# Patient Record
Sex: Male | Born: 1960 | Race: Black or African American | Hispanic: No | Marital: Single | Smoking: Former smoker
Health system: Southern US, Community
[De-identification: ages and names within clinical notes are randomized; demographics above are authoritative.]

## PROBLEM LIST (undated history)

## (undated) DIAGNOSIS — R4701 Aphasia: Secondary | ICD-10-CM

## (undated) DIAGNOSIS — D500 Iron deficiency anemia secondary to blood loss (chronic): Secondary | ICD-10-CM

## (undated) DIAGNOSIS — R159 Full incontinence of feces: Secondary | ICD-10-CM

## (undated) DIAGNOSIS — E46 Unspecified protein-calorie malnutrition: Secondary | ICD-10-CM

## (undated) DIAGNOSIS — K219 Gastro-esophageal reflux disease without esophagitis: Secondary | ICD-10-CM

## (undated) DIAGNOSIS — E785 Hyperlipidemia, unspecified: Secondary | ICD-10-CM

## (undated) DIAGNOSIS — G931 Anoxic brain damage, not elsewhere classified: Secondary | ICD-10-CM

## (undated) DIAGNOSIS — N200 Calculus of kidney: Secondary | ICD-10-CM

## (undated) DIAGNOSIS — Z7401 Bed confinement status: Secondary | ICD-10-CM

## (undated) DIAGNOSIS — N390 Urinary tract infection, site not specified: Secondary | ICD-10-CM

## (undated) DIAGNOSIS — Z8489 Family history of other specified conditions: Secondary | ICD-10-CM

## (undated) DIAGNOSIS — I219 Acute myocardial infarction, unspecified: Secondary | ICD-10-CM

## (undated) DIAGNOSIS — I10 Essential (primary) hypertension: Secondary | ICD-10-CM

## (undated) DIAGNOSIS — N183 Chronic kidney disease, stage 3 unspecified: Secondary | ICD-10-CM

## (undated) DIAGNOSIS — Z993 Dependence on wheelchair: Secondary | ICD-10-CM

## (undated) DIAGNOSIS — Z87442 Personal history of urinary calculi: Secondary | ICD-10-CM

## (undated) DIAGNOSIS — R479 Unspecified speech disturbances: Secondary | ICD-10-CM

## (undated) DIAGNOSIS — R413 Other amnesia: Secondary | ICD-10-CM

## (undated) DIAGNOSIS — I213 ST elevation (STEMI) myocardial infarction of unspecified site: Secondary | ICD-10-CM

## (undated) DIAGNOSIS — M24549 Contracture, unspecified hand: Secondary | ICD-10-CM

## (undated) DIAGNOSIS — R2681 Unsteadiness on feet: Secondary | ICD-10-CM

## (undated) DIAGNOSIS — R351 Nocturia: Secondary | ICD-10-CM

## (undated) DIAGNOSIS — Z741 Need for assistance with personal care: Secondary | ICD-10-CM

## (undated) DIAGNOSIS — R319 Hematuria, unspecified: Secondary | ICD-10-CM

## (undated) DIAGNOSIS — K148 Other diseases of tongue: Secondary | ICD-10-CM

## (undated) DIAGNOSIS — R32 Unspecified urinary incontinence: Secondary | ICD-10-CM

## (undated) DIAGNOSIS — R1310 Dysphagia, unspecified: Secondary | ICD-10-CM

## (undated) DIAGNOSIS — B9620 Unspecified Escherichia coli [E. coli] as the cause of diseases classified elsewhere: Secondary | ICD-10-CM

## (undated) HISTORY — DX: Unspecified Escherichia coli (E. coli) as the cause of diseases classified elsewhere: B96.20

## (undated) HISTORY — DX: ST elevation (STEMI) myocardial infarction of unspecified site: I21.3

## (undated) HISTORY — DX: Unsteadiness on feet: R26.81

## (undated) HISTORY — DX: Essential (primary) hypertension: I10

## (undated) HISTORY — DX: Urinary tract infection, site not specified: N39.0

## (undated) HISTORY — DX: Anoxic brain damage, not elsewhere classified: G93.1

## (undated) HISTORY — DX: Hyperlipidemia, unspecified: E78.5

## (undated) HISTORY — DX: Dysphagia, unspecified: R13.10

---

## 2012-12-04 ENCOUNTER — Encounter (HOSPITAL_COMMUNITY): Payer: Self-pay | Admitting: Emergency Medicine

## 2012-12-04 ENCOUNTER — Emergency Department (HOSPITAL_COMMUNITY)
Admission: EM | Admit: 2012-12-04 | Discharge: 2012-12-04 | Discharge status: Against Medical Advice | Payer: Self-pay | Attending: Emergency Medicine | Admitting: Emergency Medicine

## 2012-12-04 DIAGNOSIS — F1010 Alcohol abuse, uncomplicated: Secondary | ICD-10-CM | POA: Insufficient documentation

## 2012-12-04 DIAGNOSIS — S199XXA Unspecified injury of neck, initial encounter: Secondary | ICD-10-CM | POA: Insufficient documentation

## 2012-12-04 DIAGNOSIS — S0993XA Unspecified injury of face, initial encounter: Secondary | ICD-10-CM | POA: Insufficient documentation

## 2012-12-04 DIAGNOSIS — T7411XA Adult physical abuse, confirmed, initial encounter: Secondary | ICD-10-CM | POA: Insufficient documentation

## 2012-12-04 HISTORY — DX: Essential (primary) hypertension: I10
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
## 2013-11-12 ENCOUNTER — Encounter (HOSPITAL_COMMUNITY): Payer: Self-pay | Admitting: Emergency Medicine

## 2013-11-12 ENCOUNTER — Emergency Department (HOSPITAL_COMMUNITY): Payer: No Typology Code available for payment source

## 2013-11-12 ENCOUNTER — Emergency Department (HOSPITAL_COMMUNITY)
Admission: EM | Admit: 2013-11-12 | Discharge: 2013-11-12 | Disposition: A | Discharge status: Home / Self Care | Payer: No Typology Code available for payment source | Attending: Emergency Medicine | Admitting: Emergency Medicine

## 2013-11-12 DIAGNOSIS — M542 Cervicalgia: Secondary | ICD-10-CM

## 2013-11-12 DIAGNOSIS — S0993XA Unspecified injury of face, initial encounter: Secondary | ICD-10-CM | POA: Insufficient documentation

## 2013-11-12 DIAGNOSIS — S199XXA Unspecified injury of neck, initial encounter: Secondary | ICD-10-CM

## 2013-11-12 DIAGNOSIS — Y92410 Unspecified street and highway as the place of occurrence of the external cause: Secondary | ICD-10-CM | POA: Insufficient documentation

## 2013-11-12 DIAGNOSIS — Z79899 Other long term (current) drug therapy: Secondary | ICD-10-CM | POA: Insufficient documentation

## 2013-11-12 DIAGNOSIS — Y9389 Activity, other specified: Secondary | ICD-10-CM | POA: Insufficient documentation

## 2013-11-12 DIAGNOSIS — M545 Low back pain, unspecified: Secondary | ICD-10-CM

## 2013-11-12 DIAGNOSIS — F17200 Nicotine dependence, unspecified, uncomplicated: Secondary | ICD-10-CM | POA: Insufficient documentation

## 2013-11-12 DIAGNOSIS — S0990XA Unspecified injury of head, initial encounter: Secondary | ICD-10-CM | POA: Insufficient documentation

## 2013-11-12 DIAGNOSIS — I10 Essential (primary) hypertension: Secondary | ICD-10-CM | POA: Insufficient documentation

## 2013-11-12 DIAGNOSIS — IMO0002 Reserved for concepts with insufficient information to code with codable children: Secondary | ICD-10-CM | POA: Insufficient documentation
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
## 2013-11-12 MED ORDER — HYDROCODONE-ACETAMINOPHEN 5-325 MG PO TABS
1.0000 | ORAL_TABLET | Freq: Once | ORAL | Status: AC
  Administered 2013-11-12: 1 via ORAL
  Filled 2013-11-12: qty 1

## 2013-11-12 MED ORDER — IBUPROFEN 800 MG PO TABS: 800.0000 mg | ORAL_TABLET | Freq: Three times a day (TID) | ORAL | Status: DC
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
## 2013-11-14 NOTE — ED Provider Notes (Signed)
Medical screening examination/treatment/procedure(s) were performed by non-physician practitioner and as supervising physician I was immediately available for consultation/collaboration.   EKG Interpretation None       Richarda Blade, MD 11/14/13 2115

## 2016-08-01 ENCOUNTER — Encounter (HOSPITAL_COMMUNITY): Payer: Self-pay | Admitting: Emergency Medicine

## 2016-08-01 ENCOUNTER — Emergency Department (HOSPITAL_COMMUNITY)
Admission: EM | Admit: 2016-08-01 | Discharge: 2016-08-01 | Disposition: A | Discharge status: Home / Self Care | Payer: Self-pay | Attending: Emergency Medicine | Admitting: Emergency Medicine

## 2016-08-01 DIAGNOSIS — F17210 Nicotine dependence, cigarettes, uncomplicated: Secondary | ICD-10-CM | POA: Insufficient documentation

## 2016-08-01 DIAGNOSIS — Z5321 Procedure and treatment not carried out due to patient leaving prior to being seen by health care provider: Secondary | ICD-10-CM | POA: Insufficient documentation

## 2016-08-01 DIAGNOSIS — R1032 Left lower quadrant pain: Secondary | ICD-10-CM | POA: Insufficient documentation

## 2016-08-01 DIAGNOSIS — R1110 Vomiting, unspecified: Secondary | ICD-10-CM | POA: Insufficient documentation

## 2016-08-01 DIAGNOSIS — Z7982 Long term (current) use of aspirin: Secondary | ICD-10-CM | POA: Insufficient documentation

## 2016-08-01 DIAGNOSIS — I10 Essential (primary) hypertension: Secondary | ICD-10-CM | POA: Insufficient documentation

## 2016-08-01 LAB — COMPREHENSIVE METABOLIC PANEL
ALT: 29 U/L (ref 17–63)
AST: 25 U/L (ref 15–41)
Albumin: 4.6 g/dL (ref 3.5–5.0)
Alkaline Phosphatase: 70 U/L (ref 38–126)
Anion gap: 9 (ref 5–15)
BUN: 15 mg/dL (ref 6–20)
CALCIUM: 9.9 mg/dL (ref 8.9–10.3)
CO2: 27 mmol/L (ref 22–32)
Chloride: 104 mmol/L (ref 101–111)
Creatinine, Ser: 1.62 mg/dL — ABNORMAL HIGH (ref 0.61–1.24)
GFR calc Af Amer: 54 mL/min — ABNORMAL LOW (ref >60)
GFR calc non Af Amer: 47 mL/min — ABNORMAL LOW (ref >60)
Glucose, Bld: 131 mg/dL — ABNORMAL HIGH (ref 65–99)
POTASSIUM: 4.4 mmol/L (ref 3.5–5.1)
Sodium: 140 mmol/L (ref 135–145)
TOTAL PROTEIN: 8.4 g/dL — AB (ref 6.5–8.1)
Total Bilirubin: 1 mg/dL (ref 0.3–1.2)

## 2016-08-01 LAB — CBC
HEMATOCRIT: 44.3 % (ref 39.0–52.0)
Hemoglobin: 15.8 g/dL (ref 13.0–17.0)
MCH: 33.3 pg (ref 26.0–34.0)
MCHC: 35.7 g/dL (ref 30.0–36.0)
MCV: 93.3 fL (ref 78.0–100.0)
Platelets: 156 10*3/uL (ref 150–400)
RBC: 4.75 MIL/uL (ref 4.22–5.81)
RDW: 12.6 % (ref 11.5–15.5)
WBC: 9.6 10*3/uL (ref 4.0–10.5)

## 2016-08-01 LAB — LIPASE, BLOOD: Lipase: 79 U/L — ABNORMAL HIGH (ref 11–51)

## 2016-08-01 NOTE — ED Triage Notes (Signed)
Called to take to room  No answer from lobby 

## 2016-08-01 NOTE — ED Notes (Signed)
Pt called for v\s recheck with no response from lobby.

## 2016-08-01 NOTE — ED Triage Notes (Signed)
Pt c/o LLQ abdominal pain, emesis onset this morning. Pt unable to differentiate whether pain is in abdomen or flank. No diarrhea.

## 2016-08-01 NOTE — ED Notes (Signed)
Pt called  No response from lobby  

## 2016-08-02 ENCOUNTER — Emergency Department (HOSPITAL_COMMUNITY): Payer: Self-pay

## 2016-08-02 ENCOUNTER — Emergency Department (HOSPITAL_COMMUNITY)
Admission: EM | Admit: 2016-08-02 | Discharge: 2016-08-02 | Disposition: A | Discharge status: Home / Self Care | Payer: Self-pay | Attending: Emergency Medicine | Admitting: Emergency Medicine

## 2016-08-02 ENCOUNTER — Encounter (HOSPITAL_COMMUNITY): Payer: Self-pay | Admitting: Emergency Medicine

## 2016-08-02 DIAGNOSIS — N200 Calculus of kidney: Secondary | ICD-10-CM | POA: Insufficient documentation

## 2016-08-02 DIAGNOSIS — F17210 Nicotine dependence, cigarettes, uncomplicated: Secondary | ICD-10-CM | POA: Insufficient documentation

## 2016-08-02 DIAGNOSIS — I10 Essential (primary) hypertension: Secondary | ICD-10-CM | POA: Insufficient documentation

## 2016-08-02 DIAGNOSIS — R3915 Urgency of urination: Secondary | ICD-10-CM

## 2016-08-02 DIAGNOSIS — N179 Acute kidney failure, unspecified: Secondary | ICD-10-CM | POA: Insufficient documentation

## 2016-08-02 DIAGNOSIS — E876 Hypokalemia: Secondary | ICD-10-CM | POA: Insufficient documentation

## 2016-08-02 DIAGNOSIS — R109 Unspecified abdominal pain: Secondary | ICD-10-CM

## 2016-08-02 DIAGNOSIS — Z7982 Long term (current) use of aspirin: Secondary | ICD-10-CM | POA: Insufficient documentation

## 2016-08-02 DIAGNOSIS — R112 Nausea with vomiting, unspecified: Secondary | ICD-10-CM

## 2016-08-02 HISTORY — DX: Calculus of kidney: N20.0

## 2016-08-02 LAB — URINALYSIS, ROUTINE W REFLEX MICROSCOPIC
Glucose, UA: NEGATIVE mg/dL
KETONES UR: NEGATIVE mg/dL
LEUKOCYTES UA: NEGATIVE
NITRITE: NEGATIVE
PH: 6 (ref 5.0–8.0)
PROTEIN: 30 mg/dL — AB
Specific Gravity, Urine: >1.03 — ABNORMAL HIGH (ref 1.005–1.030)

## 2016-08-02 LAB — CBC WITH DIFFERENTIAL/PLATELET
BASOS ABS: 0 10*3/uL (ref 0.0–0.1)
BASOS PCT: 0 %
Eosinophils Absolute: 0 10*3/uL (ref 0.0–0.7)
Eosinophils Relative: 0 %
HCT: 43.3 % (ref 39.0–52.0)
HEMOGLOBIN: 15.2 g/dL (ref 13.0–17.0)
Lymphocytes Relative: 13 %
Lymphs Abs: 1.5 10*3/uL (ref 0.7–4.0)
MCH: 33.3 pg (ref 26.0–34.0)
MCHC: 35.1 g/dL (ref 30.0–36.0)
MCV: 94.7 fL (ref 78.0–100.0)
Monocytes Absolute: 1 10*3/uL (ref 0.1–1.0)
Monocytes Relative: 9 %
NEUTROS PCT: 78 %
Neutro Abs: 8.8 10*3/uL — ABNORMAL HIGH (ref 1.7–7.7)
Platelets: 169 10*3/uL (ref 150–400)
RBC: 4.57 MIL/uL (ref 4.22–5.81)
RDW: 12.9 % (ref 11.5–15.5)
WBC: 11.2 10*3/uL — ABNORMAL HIGH (ref 4.0–10.5)

## 2016-08-02 LAB — COMPREHENSIVE METABOLIC PANEL
ALBUMIN: 4.4 g/dL (ref 3.5–5.0)
ALK PHOS: 61 U/L (ref 38–126)
ALT: 23 U/L (ref 17–63)
AST: 24 U/L (ref 15–41)
Anion gap: 9 (ref 5–15)
BUN: 17 mg/dL (ref 6–20)
CO2: 25 mmol/L (ref 22–32)
CREATININE: 2.05 mg/dL — AB (ref 0.61–1.24)
Calcium: 9.2 mg/dL (ref 8.9–10.3)
Chloride: 103 mmol/L (ref 101–111)
GFR calc Af Amer: 41 mL/min — ABNORMAL LOW (ref >60)
GFR, EST NON AFRICAN AMERICAN: 35 mL/min — AB (ref >60)
GLUCOSE: 123 mg/dL — AB (ref 65–99)
Potassium: 3.4 mmol/L — ABNORMAL LOW (ref 3.5–5.1)
Sodium: 137 mmol/L (ref 135–145)
Total Bilirubin: 1.1 mg/dL (ref 0.3–1.2)
Total Protein: 7.8 g/dL (ref 6.5–8.1)

## 2016-08-02 LAB — URINALYSIS, MICROSCOPIC (REFLEX)

## 2016-08-02 LAB — LIPASE, BLOOD: Lipase: 37 U/L (ref 11–51)

## 2016-08-02 IMAGING — CT CT RENAL STONE PROTOCOL
2 of 3 series · 16 of 46 positions shown, 18 images · non-contrast
Comparison: None.

CLINICAL DATA: Lower back pain for 1 day predominately on the left

EXAM:
CT ABDOMEN AND PELVIS WITHOUT CONTRAST
TECHNIQUE: Multidetector CT imaging of the abdomen and pelvis was performed
following the standard protocol without IV contrast.

[Series 3: coronal · coronal · 0.73mm/px · 3 of 167 slices shown]
[im 56/167  soft-tissue]
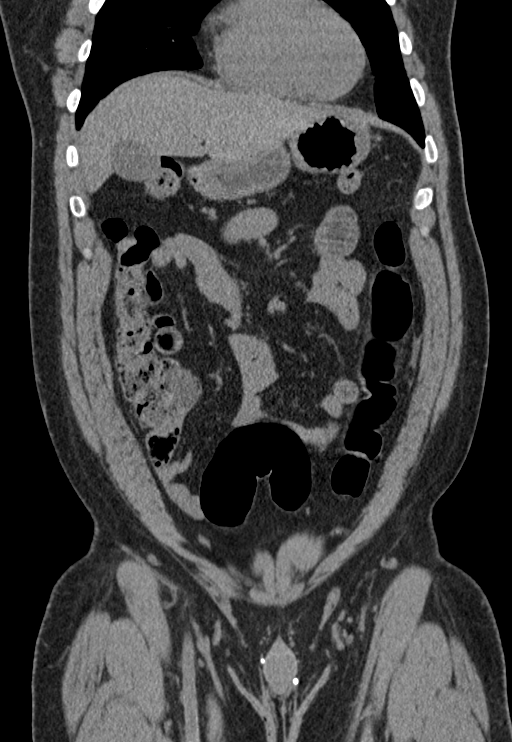
[im 74/167  soft-tissue]
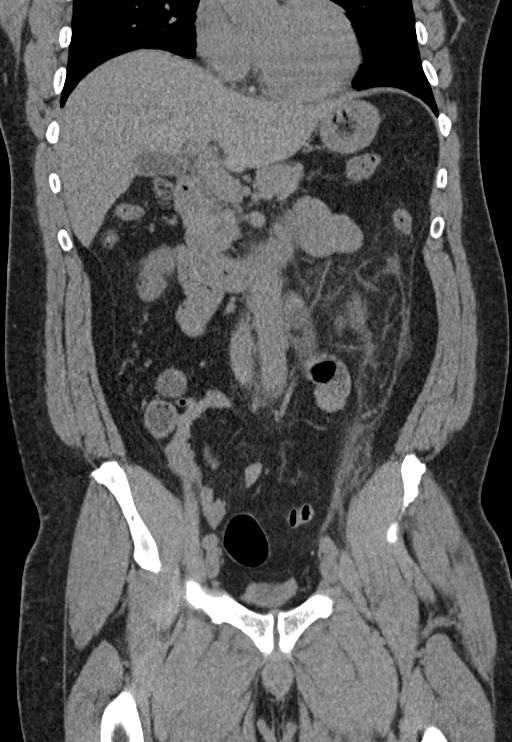
[im 93/167  soft-tissue]
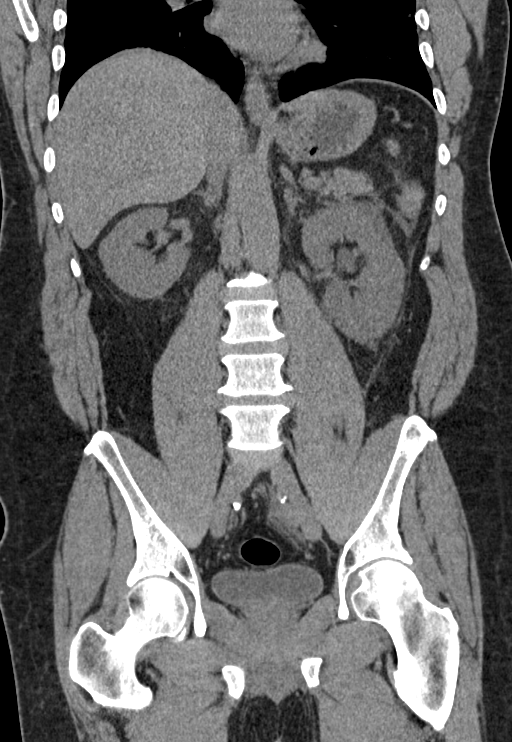

[Series 6: lung · axial · 0.98mm/px · z∈[-119,+5]mm · 13 of 72 slices shown, 15 images]
[im 5/72  soft-tissue]
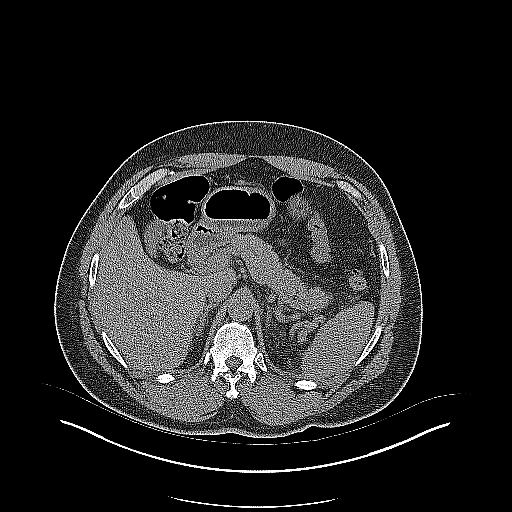
[im 5/72  bone]
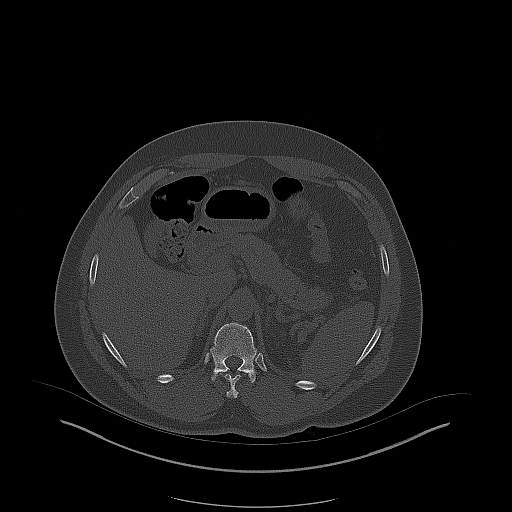
[im 10/72  soft-tissue]
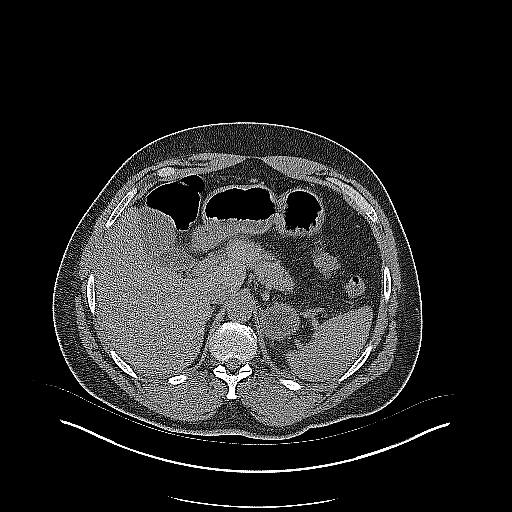
[im 14/72  soft-tissue]
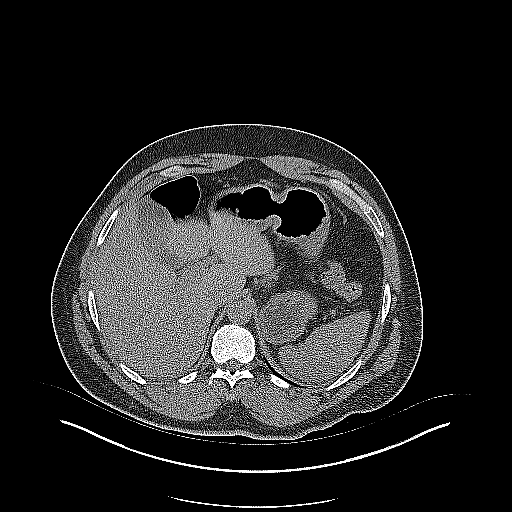
[im 21/72  soft-tissue]
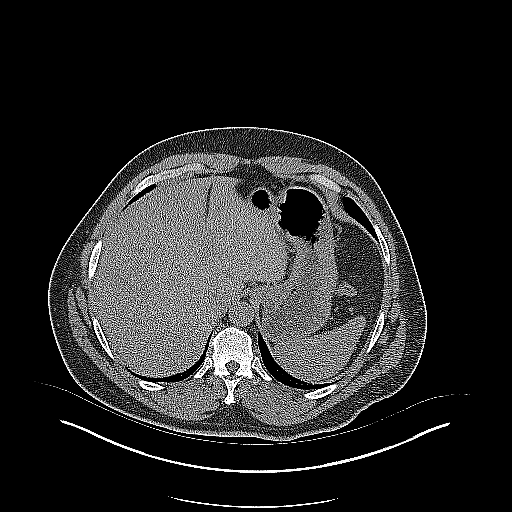
[im 26/72  soft-tissue]
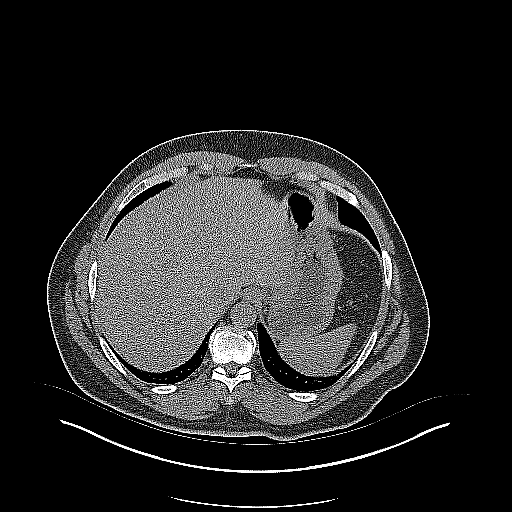
[im 30/72  soft-tissue]
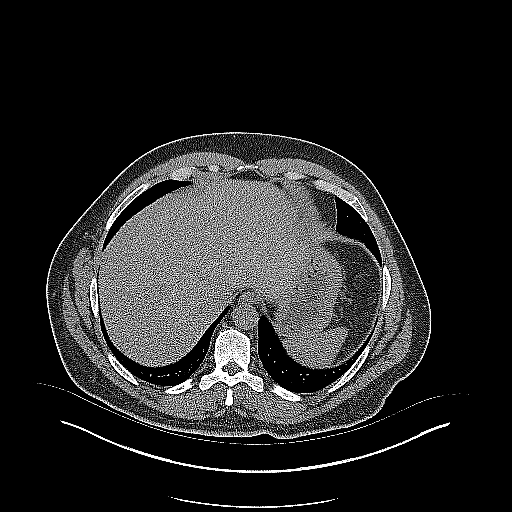
[im 37/72  soft-tissue]
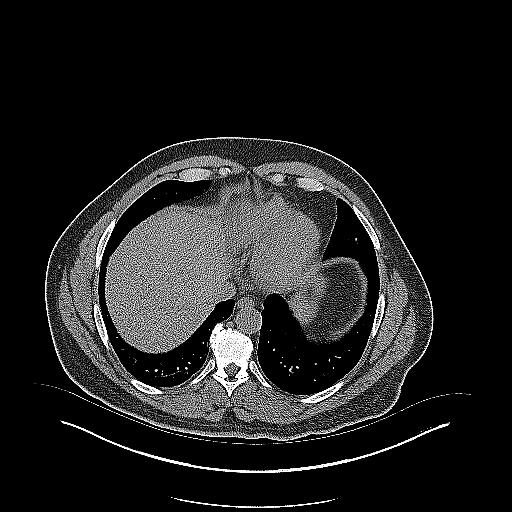
[im 42/72  soft-tissue]
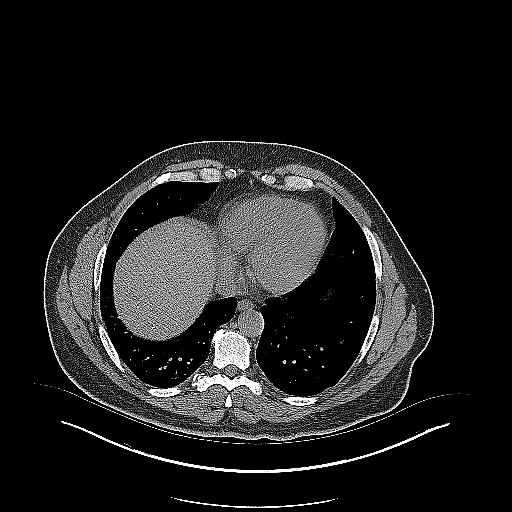
[im 46/72  soft-tissue]
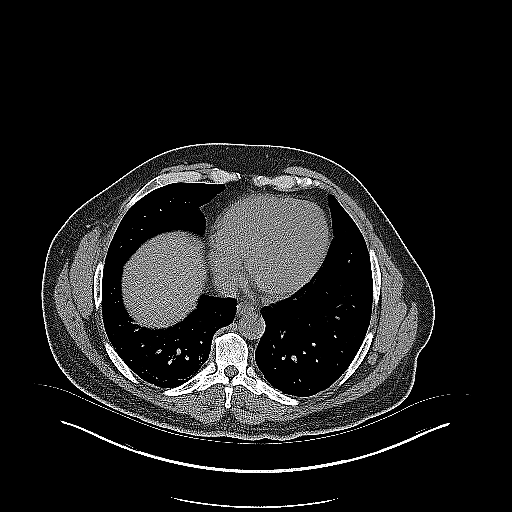
[im 46/72  bone]
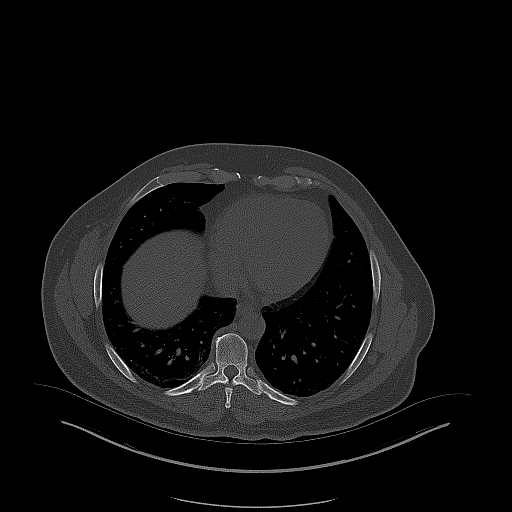
[im 51/72  soft-tissue]
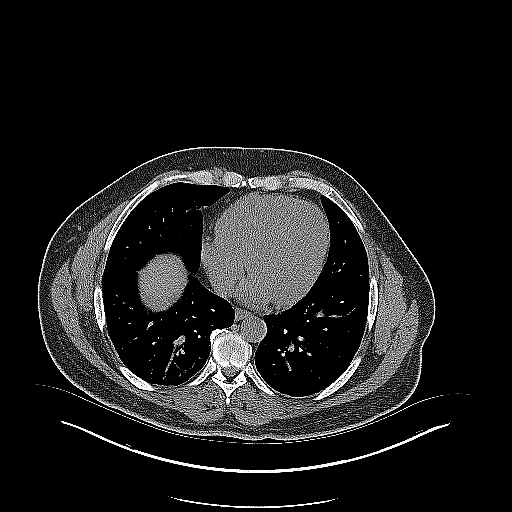
[im 58/72  soft-tissue]
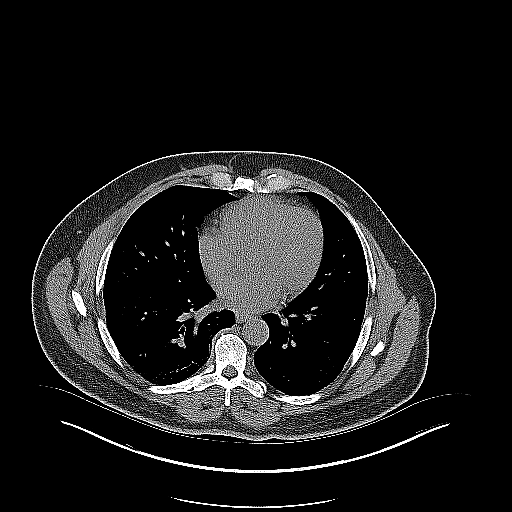
[im 62/72  soft-tissue]
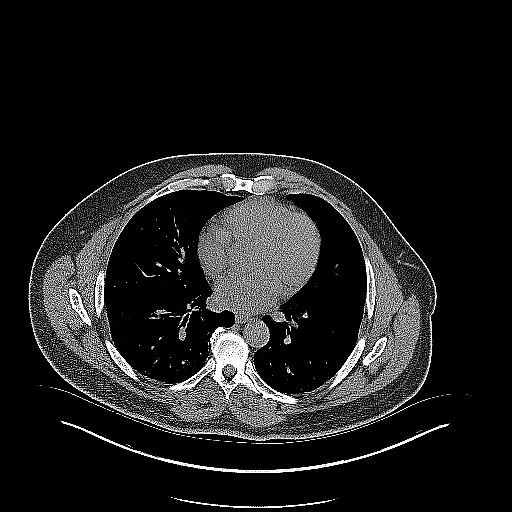
[im 67/72  soft-tissue]
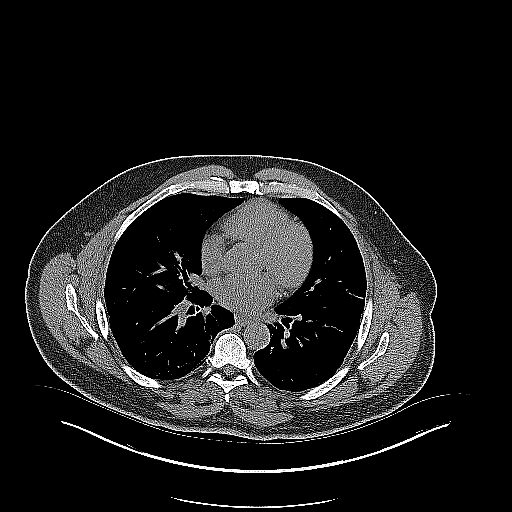

[16 of 46 positions shown; findings below may reference images not displayed]

FINDINGS: Lower chest: Mild dependent atelectatic changes are noted. No focal
infiltrate or effusion is seen.

Hepatobiliary: No focal liver abnormality is seen. No gallstones,
gallbladder wall thickening, or biliary dilatation.

Pancreas: Unremarkable. No pancreatic ductal dilatation or
surrounding inflammatory changes.

Spleen: Normal in size without focal abnormality.

Adrenals/Urinary Tract: The right adrenal gland and right kidney are
within normal limits. The left adrenal gland is also within normal
limits. The left kidney shows decreased attenuation and obstructive
changes with perinephric stranding. A small 2-3 mm upper pole stone
is seen. Additionally there is a 4 mm distal left ureteral stone at
the UVJ causing the obstructive change. The bladder is decompressed.

Stomach/Bowel: Scattered diverticular change of the colon is noted.
The appendix is within normal limits. No inflammatory changes are
seen.

Vascular/Lymphatic: Aortic atherosclerosis. No enlarged abdominal or
pelvic lymph nodes.

Reproductive: Prostate is unremarkable.

Other: No abdominal wall hernia or abnormality. No abdominopelvic
ascites.

Musculoskeletal: No acute or significant osseous findings.
IMPRESSION: Left distal ureteral stone with obstructive change. Nonobstructing
left renal calculus is noted.

## 2016-08-02 MED ORDER — POTASSIUM CHLORIDE CRYS ER 20 MEQ PO TBCR
20.0000 meq | EXTENDED_RELEASE_TABLET | Freq: Once | ORAL | Status: AC
  Administered 2016-08-02: 20 meq via ORAL
  Filled 2016-08-02: qty 1

## 2016-08-02 MED ORDER — HYDROCODONE-ACETAMINOPHEN 5-325 MG PO TABS: 1.0000 | ORAL_TABLET | Freq: Four times a day (QID) | ORAL | 0 refills | Status: DC | PRN

## 2016-08-02 MED ORDER — ONDANSETRON HCL 8 MG PO TABS: 8.0000 mg | ORAL_TABLET | Freq: Three times a day (TID) | ORAL | 0 refills | Status: DC | PRN

## 2016-08-02 MED ORDER — MORPHINE SULFATE (PF) 4 MG/ML IV SOLN
4.0000 mg | Freq: Once | INTRAVENOUS | Status: AC
  Administered 2016-08-02: 4 mg via INTRAVENOUS
  Filled 2016-08-02: qty 1

## 2016-08-02 MED ORDER — NAPROXEN 250 MG PO TABS: 250.0000 mg | ORAL_TABLET | Freq: Two times a day (BID) | ORAL | 0 refills | Status: DC | PRN

## 2016-08-02 MED ORDER — SODIUM CHLORIDE 0.9 % IV BOLUS (SEPSIS)
1000.0000 mL | Freq: Once | INTRAVENOUS | Status: AC
Start: 2016-08-02 — End: 2016-08-02
  Administered 2016-08-02: 1000 mL via INTRAVENOUS

## 2016-08-02 NOTE — ED Notes (Signed)
Patient given cup and instructed to give a urine sample when possible, unable to give at this time.

## 2016-08-02 NOTE — ED Provider Notes (Signed)
[facility] DEPT Provider Note   CSN: [id number] Arrival date & time: 08/02/16  1330     History   Chief Complaint Chief Complaint  Patient presents with  . Flank Pain    l/lower back pain    HPI LEONARD FAGNANI is a 55 y.o. male with a PMHx of HTN and nephrolithiasis, who presents to the ED with complaints of gradual onset left flank pain that began yesterday. Patient describes the pain as 7/10 intermittent pressure-like pain in the left flank radiating to the suprapubic area, with no known aggravating or alleviating factors given that he has not tried anything prior to arrival. Associated symptoms include increased urinary urgency, hesitancy, and darker urine. He reports that yesterday he had nausea and 2 episodes of nonbloody nonbilious emesis, but that has completely resolved and he has not had any emesis today. He states it feels somewhat similar to his prior kidney stone, although less severe than the last one. He hasn't had one in multiple years. He does not have a PCP or urologist. He has never needed any urologic procedure to pass a kidney stone.  He denies fevers, chills, CP, SOB, ongoing n/v, diarrhea, constipation, obstipation, melena, hematochezia, rectal pain, penile discharge, testicular pain/swelling, hematuria, dysuria, urinary frequency, malodorous urine, myalgias, arthralgias, numbness, tingling, weakness, or any other complaints at this time.    The history is provided by the patient and medical records. No language interpreter was used.  Flank Pain  This is a new problem. The current episode started yesterday. The problem occurs hourly (intermittently). The problem has not changed since onset.Associated symptoms include abdominal pain (radiating from flank). Pertinent negatives include no chest pain and no shortness of breath. Nothing aggravates the symptoms. Nothing relieves the symptoms. He has tried nothing for the symptoms. The treatment provided no relief.     Past Medical History:  Diagnosis Date  . Hypertension   . Kidney stones     There are no active problems to display for this patient.   History reviewed. No pertinent surgical history.     Home Medications    Prior to Admission medications   Medication Sig Start Date End Date Taking? Authorizing Provider  aspirin-acetaminophen-caffeine (EXCEDRIN MIGRAINE) [phone] MG per tablet Take 1 tablet by mouth every 6 (six) hours as needed (toothache).    Historical Provider, MD  ibuprofen (ADVIL,MOTRIN) 800 MG tablet Take 1 tablet (800 mg total) by mouth 3 (three) times daily. Take with meals 11/12/13   Harvie Heck, PA-C    Family History Family History  Problem Relation Age of Onset  . Cancer Mother   . Hypertension Father     Social History Social History  Substance Use Topics  . Smoking status: Current Every Day Smoker    Packs/day: 1.00    Types: Cigarettes  . Smokeless tobacco: Never Used  . Alcohol use Yes     Comment: occasional     Allergies   Patient has no known allergies.   Review of Systems Review of Systems  Constitutional: Negative for chills and fever.  Respiratory: Negative for shortness of breath.   Cardiovascular: Negative for chest pain.  Gastrointestinal: Positive for abdominal pain (radiating from flank), nausea (yesterday only, none ongoing) and vomiting (2x yesterday; none today). Negative for blood in stool, constipation, diarrhea and rectal pain.  Genitourinary: Positive for flank pain and urgency. Negative for discharge, dysuria, frequency, hematuria, scrotal swelling and testicular pain.       +urinary hesitancy +darker urine No malodorous  urine  Musculoskeletal: Negative for arthralgias and myalgias.  Skin: Negative for color change.  Allergic/Immunologic: Negative for immunocompromised state.  Neurological: Negative for weakness and numbness.  Psychiatric/Behavioral: Negative for confusion.   10 Systems reviewed and are  negative for acute change except as noted in the HPI.   Physical Exam Updated Vital Signs BP (!) 159/109   Pulse 73   Temp 97.8 F (36.6 C) (Oral)   Resp 18   Ht 6' (1.829 m)   Wt 102.1 kg   SpO2 95%   BMI 30.53 kg/m   Physical Exam  Constitutional: He is oriented to person, place, and time. Vital signs are normal. He appears well-developed and well-nourished.  Non-toxic appearance. No distress.  Afebrile, nontoxic, NAD  HENT:  Head: Normocephalic and atraumatic.  Mouth/Throat: Oropharynx is clear and moist and mucous membranes are normal.  Eyes: Conjunctivae and EOM are normal. Right eye exhibits no discharge. Left eye exhibits no discharge.  Neck: Normal range of motion. Neck supple.  Cardiovascular: Normal rate, regular rhythm, normal heart sounds and intact distal pulses.  Exam reveals no gallop and no friction rub.   No murmur heard. Pulmonary/Chest: Effort normal and breath sounds normal. No respiratory distress. He has no decreased breath sounds. He has no wheezes. He has no rhonchi. He has no rales.  Abdominal: Soft. Normal appearance and bowel sounds are normal. He exhibits no distension. There is tenderness in the suprapubic area. There is CVA tenderness. There is no rigidity, no rebound, no guarding, no tenderness at McBurney's point and negative Murphy's sign.  Soft, nondistended, +BS throughout, with mild suprapubic TTP tracking towards L flank, no r/g/r, neg murphy's, neg mcburney's, with +mild L sided CVA TTP   Musculoskeletal: Normal range of motion.  All spinal levels nonTTP without bony stepoffs or deformities  Gait steady Strength and sensation grossly intact in all extremities  Neurological: He is alert and oriented to person, place, and time. He has normal strength. No sensory deficit.  Skin: Skin is warm, dry and intact. No rash noted.  Psychiatric: He has a normal mood and affect.  Nursing note and vitals reviewed.    ED Treatments / Results  Labs (all  labs ordered are listed, but only abnormal results are displayed) Labs Reviewed  CBC WITH DIFFERENTIAL/PLATELET - Abnormal; Notable for the following:       Result Value   WBC 11.2 (*)    Neutro Abs 8.8 (*)    All other components within normal limits  COMPREHENSIVE METABOLIC PANEL - Abnormal; Notable for the following:    Potassium 3.4 (*)    Glucose, Bld 123 (*)    Creatinine, Ser 2.05 (*)    GFR calc non Af Amer 35 (*)    GFR calc Af Amer 41 (*)    All other components within normal limits  URINE CULTURE  LIPASE, BLOOD  URINALYSIS, ROUTINE W REFLEX MICROSCOPIC  GC/CHLAMYDIA PROBE AMP ([place]) NOT AT [facility]    EKG  EKG Interpretation None       Radiology Ct Renal Stone Study  Result Date: 08/02/2016 CLINICAL DATA:  Lower back pain for 1 day predominately on the left EXAM: CT ABDOMEN AND PELVIS WITHOUT CONTRAST TECHNIQUE: Multidetector CT imaging of the abdomen and pelvis was performed following the standard protocol without IV contrast. COMPARISON:  None. FINDINGS: Lower chest: Mild dependent atelectatic changes are noted. No focal infiltrate or effusion is seen. Hepatobiliary: No focal liver abnormality is seen. No gallstones, gallbladder wall thickening,  or biliary dilatation. Pancreas: Unremarkable. No pancreatic ductal dilatation or surrounding inflammatory changes. Spleen: Normal in size without focal abnormality. Adrenals/Urinary Tract: The right adrenal gland and right kidney are within normal limits. The left adrenal gland is also within normal limits. The left kidney shows decreased attenuation and obstructive changes with perinephric stranding. A small 2-3 mm upper pole stone is seen. Additionally there is a 4 mm distal left ureteral stone at the UVJ causing the obstructive change. The bladder is decompressed. Stomach/Bowel: Scattered diverticular change of the colon is noted. The appendix is within normal limits. No inflammatory changes are seen. Vascular/Lymphatic:  Aortic atherosclerosis. No enlarged abdominal or pelvic lymph nodes. Reproductive: Prostate is unremarkable. Other: No abdominal wall hernia or abnormality. No abdominopelvic ascites. Musculoskeletal: No acute or significant osseous findings. IMPRESSION: Left distal ureteral stone with obstructive change. Nonobstructing left renal calculus is noted. Electronically Signed   By: Inez Catalina M.D.   On: 08/02/2016 15:40    Procedures Procedures (including critical care time)  Bladder Scan Volume (mL): 27 mL   Medications Ordered in ED Medications  potassium chloride SA (K-DUR,KLOR-CON) CR tablet 20 mEq (not administered)  morphine 4 MG/ML injection 4 mg (4 mg Intravenous Given 08/02/16 1426)  sodium chloride 0.9 % bolus 1,000 mL (1,000 mLs Intravenous New Bag/Given 08/02/16 1426)     Initial Impression / Assessment and Plan / ED Course  I have reviewed the triage vital signs and the nursing notes.  Pertinent labs & imaging results that were available during my care of the patient were reviewed by me and considered in my medical decision making (see chart for details).  Clinical Course     55 y.o. male here with L flank pain radiating to suprapubic area, n/v yesterday but that resolved; increased urinary urgency and darker urine, but no hematuria/dysuria/malodorous urine. States he's had some hesitancy, but was able to produce urine sample here without difficulty. On exam, mild L flank TTP, mild suprapubic TTP, nonperitoneal, afebrile and nontoxic, gait steady. Will get U/A, UCx, bladder scan, CBC w/diff, CMP, lipase, and CT renal study. Will also obtain GC/CT probe on urine sample, in case this ends up being some type of prostatitis type issue, although doubtful as a cause given no rectal pain or penile complaints. Will give fluids and pain meds; pt declines wanting anything for nausea since this is resolved. Will reassess shortly  3:56 PM Pt feeling much better, pain resolved, tolerating  PO. Post-void Bladder scan 53mLs. CBC w/diff with mildly elevated WBC 11.2 but differential unremarkable. CMP showing Cr 2.05 which is up from yesterday's value 1.62; no prior values to compare to, but likely from dehydration/inadequate PO intake; remainder of CMP still pending. Lipase WNL. U/A in process. CT renal study showing 98mm distal left UVJ stone with some obstructive changes around L kidney including perinephric stranding and decreased attenuation; also with 2-64mm upper pole stone seen in L kidney. 39mm stone will likely pass without any difficulty. Will reassess after CMP and U/A result; likely d/c home with pain/nausea meds and urology f/up. Will reassess shortly.  4:12 PM Remainder of CMP showing mildly low K 3.4, will give Kdur here but doubt need for further repletion. U/A still pending, called lab to see what the delay is, they informed me that they're having to run it manually. Patient care to be resumed by Doreen Salvage, PA-C at shift change sign-out. Patient history has been discussed with midlevel resuming care. Please see their notes for further documentation of  pending results and dispo/care. Pt stable at sign-out and updated on transfer of care. IF U/A Unremarkable or not needing further intervention, then plan is to d/c home with pain meds, nausea meds, and f/up with urology as well as [place] in 1-2wks to establish care. Advised pt to stay hydrated with plenty of water, and strain all urine. Pt stable at time of transfer, please see Doreen Salvage' notes for documentation of U/A results and any further care.  Final Clinical Impressions(s) / ED Diagnoses   Final diagnoses:  Left flank pain  Urinary urgency  Nephrolithiasis  Non-intractable vomiting with nausea, unspecified vomiting type  AKI (acute kidney injury) ([place])  Hypokalemia    New Prescriptions New Prescriptions   HYDROCODONE-ACETAMINOPHEN (NORCO) 5-325 MG TABLET    Take 1 tablet by mouth every 6 (six) hours as needed for severe  pain.   NAPROXEN (NAPROSYN) 250 MG TABLET    Take 1 tablet (250 mg total) by mouth 2 (two) times daily as needed for mild pain, moderate pain or headache (TAKE WITH MEALS.).   ONDANSETRON (ZOFRAN) 8 MG TABLET    Take 1 tablet (8 mg total) by mouth every 8 (eight) hours as needed for nausea or vomiting.     [address] [place], PA-C 08/02/16 [place], MD 08/02/16 [phone]

## 2016-08-02 NOTE — Discharge Instructions (Signed)
Stay well hydrated with plenty of water! Take naprosyn as directed as needed for pain using norco for breakthrough pain. Do not drive or operate machinery with pain medication use. May need over-the-counter stool softener with this pain medication use. Use Zofran as needed for nausea. Strain all urine to try to catch the stone when it passes. Follow-up with the urologist in the next 1 to 2 weeks for recheck of ongoing pain and ongoing management of your kidney stone, as well as following up with [facility] in 1-2 weeks to establish medical care and to recheck your labs; however for intractable or uncontrollable pain or vomiting at home then return to the [facility] emergency department.

## 2016-08-02 NOTE — ED Triage Notes (Signed)
Pt stated that he has l/lower back pain x 24 hours. Pain decreased in severity, remains in l/flank area. Pt stated that he has pressure in bladder. Has not urinated since yesterday. Denies NVD. Has been drinking clear liquids

## 2016-08-03 LAB — URINE CULTURE: CULTURE: NO GROWTH

## 2016-08-05 LAB — GC/CHLAMYDIA PROBE AMP (~~LOC~~) NOT AT ARMC
CHLAMYDIA, DNA PROBE: NEGATIVE
Neisseria Gonorrhea: NEGATIVE

## 2019-03-08 ENCOUNTER — Other Ambulatory Visit: Payer: Self-pay

## 2019-03-08 ENCOUNTER — Emergency Department (HOSPITAL_COMMUNITY): Payer: Self-pay

## 2019-03-08 ENCOUNTER — Emergency Department (HOSPITAL_COMMUNITY)
Admission: EM | Admit: 2019-03-08 | Discharge: 2019-03-08 | Disposition: A | Discharge status: Home / Self Care | Payer: Self-pay | Attending: Emergency Medicine | Admitting: Emergency Medicine

## 2019-03-08 ENCOUNTER — Encounter (HOSPITAL_COMMUNITY): Payer: Self-pay

## 2019-03-08 DIAGNOSIS — Z79899 Other long term (current) drug therapy: Secondary | ICD-10-CM | POA: Insufficient documentation

## 2019-03-08 DIAGNOSIS — F17210 Nicotine dependence, cigarettes, uncomplicated: Secondary | ICD-10-CM | POA: Insufficient documentation

## 2019-03-08 DIAGNOSIS — M545 Low back pain, unspecified: Secondary | ICD-10-CM

## 2019-03-08 DIAGNOSIS — I10 Essential (primary) hypertension: Secondary | ICD-10-CM | POA: Insufficient documentation

## 2019-03-08 LAB — URINALYSIS, COMPLETE (UACMP) WITH MICROSCOPIC
Bacteria, UA: NONE SEEN
Bilirubin Urine: NEGATIVE
Glucose, UA: NEGATIVE mg/dL
Hgb urine dipstick: NEGATIVE
Ketones, ur: NEGATIVE mg/dL
Nitrite: NEGATIVE
Protein, ur: NEGATIVE mg/dL
Specific Gravity, Urine: 1.02 (ref 1.005–1.030)
pH: 5 (ref 5.0–8.0)

## 2019-03-08 IMAGING — CR LUMBAR SPINE - COMPLETE 4+ VIEW
5 series · 5 of 5 positions shown · non-contrast
Comparison: CT [DATE].

CLINICAL DATA: Low back pain.  No injury.

EXAM:
LUMBAR SPINE - COMPLETE 4+ VIEW

[t lumbar spine ap]
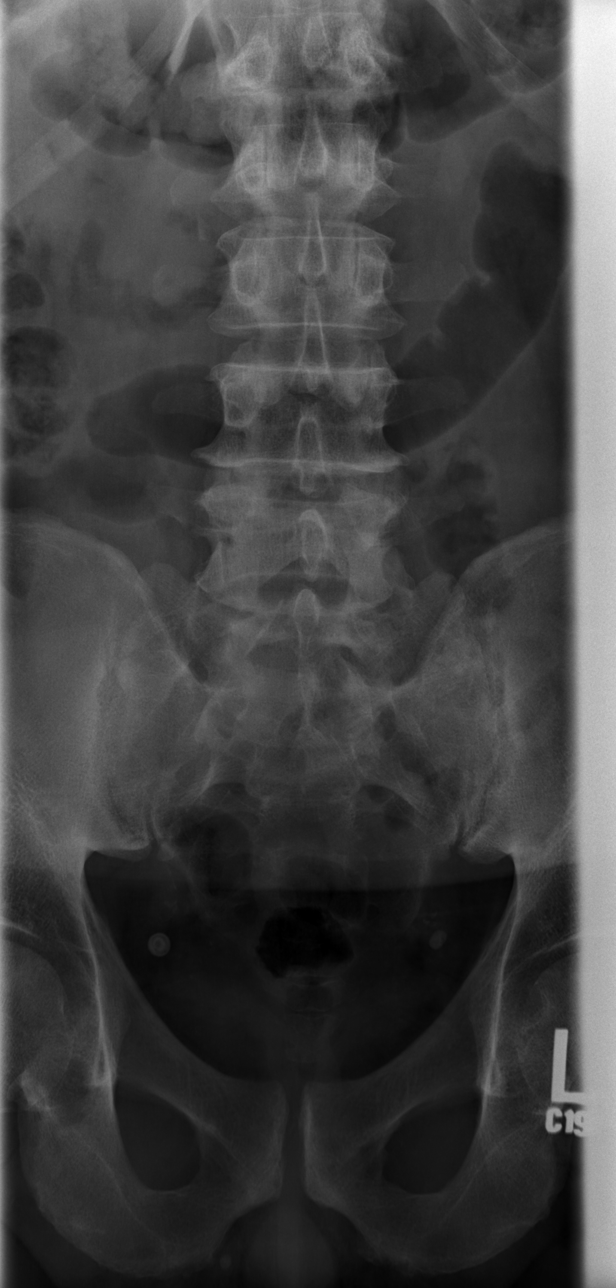

[t lumbar spine obl (1 of 2)]
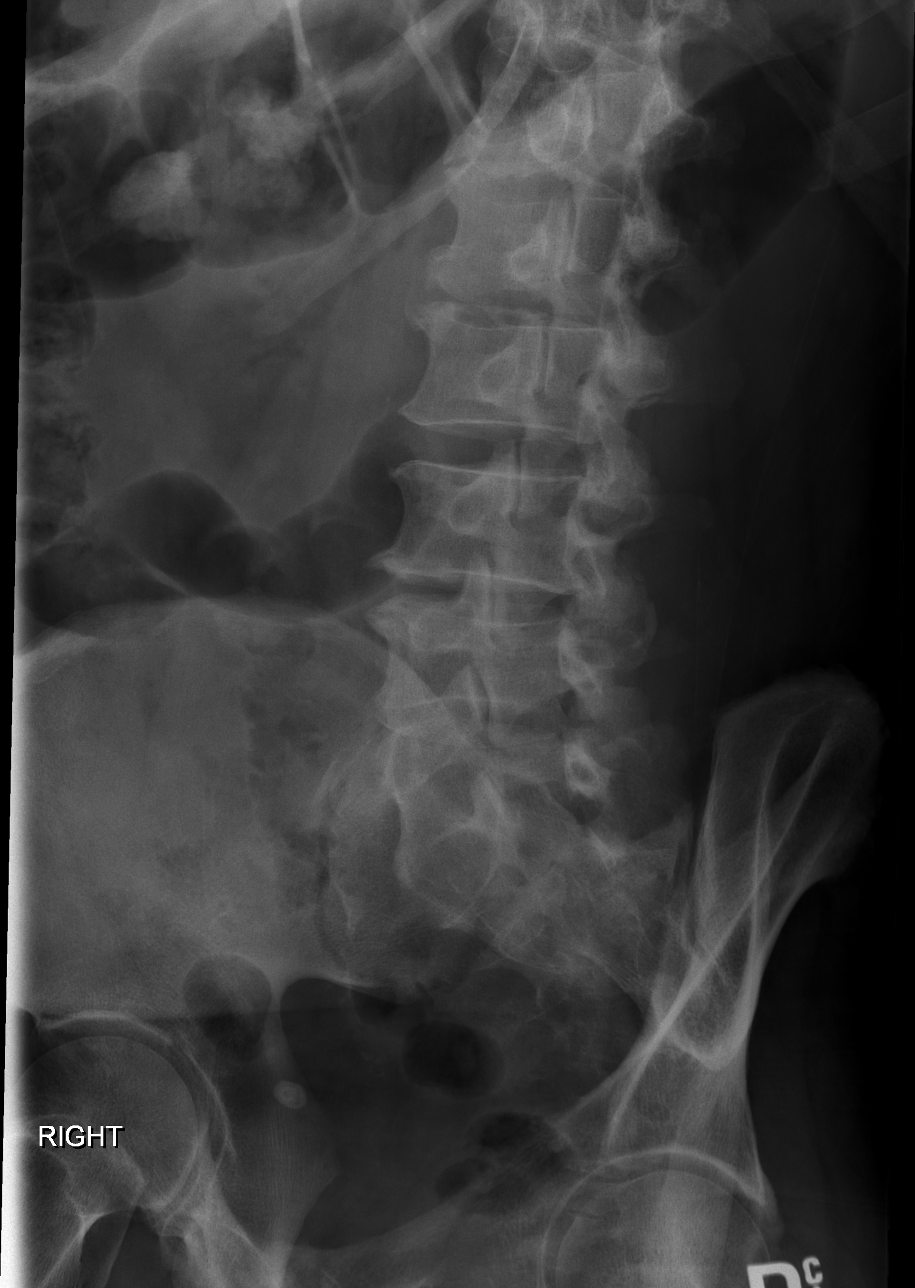

[t lumbar spine obl (2 of 2)]
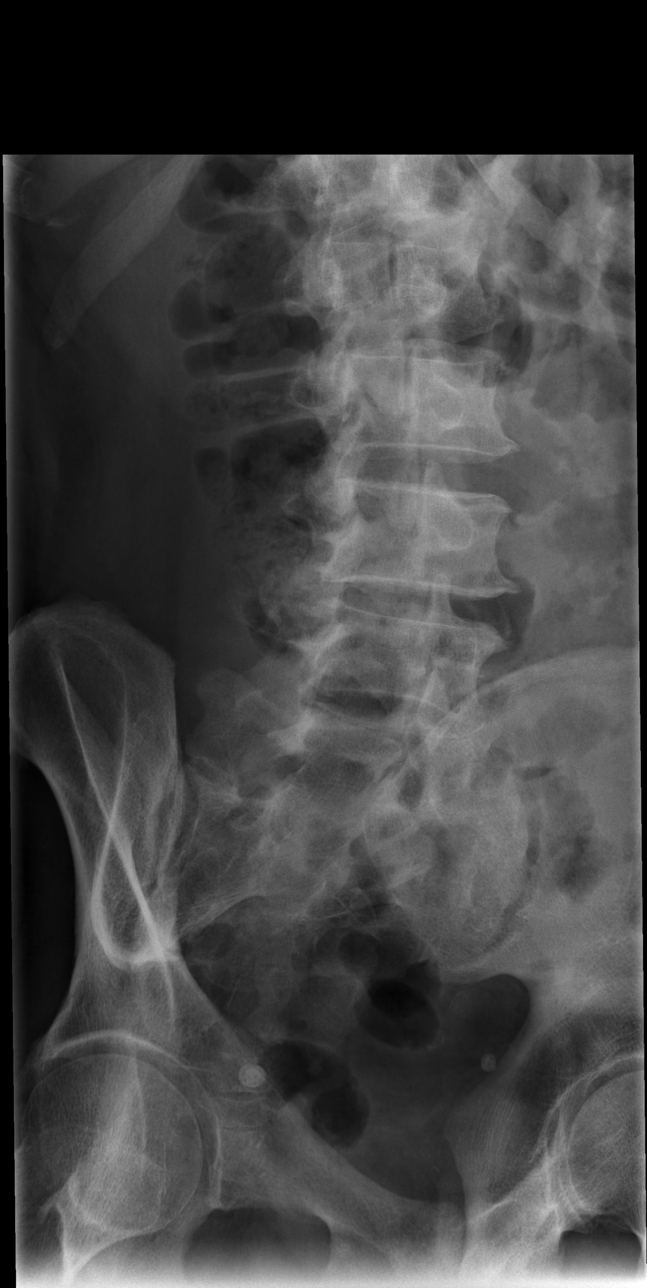

[t lumbar spine lat]
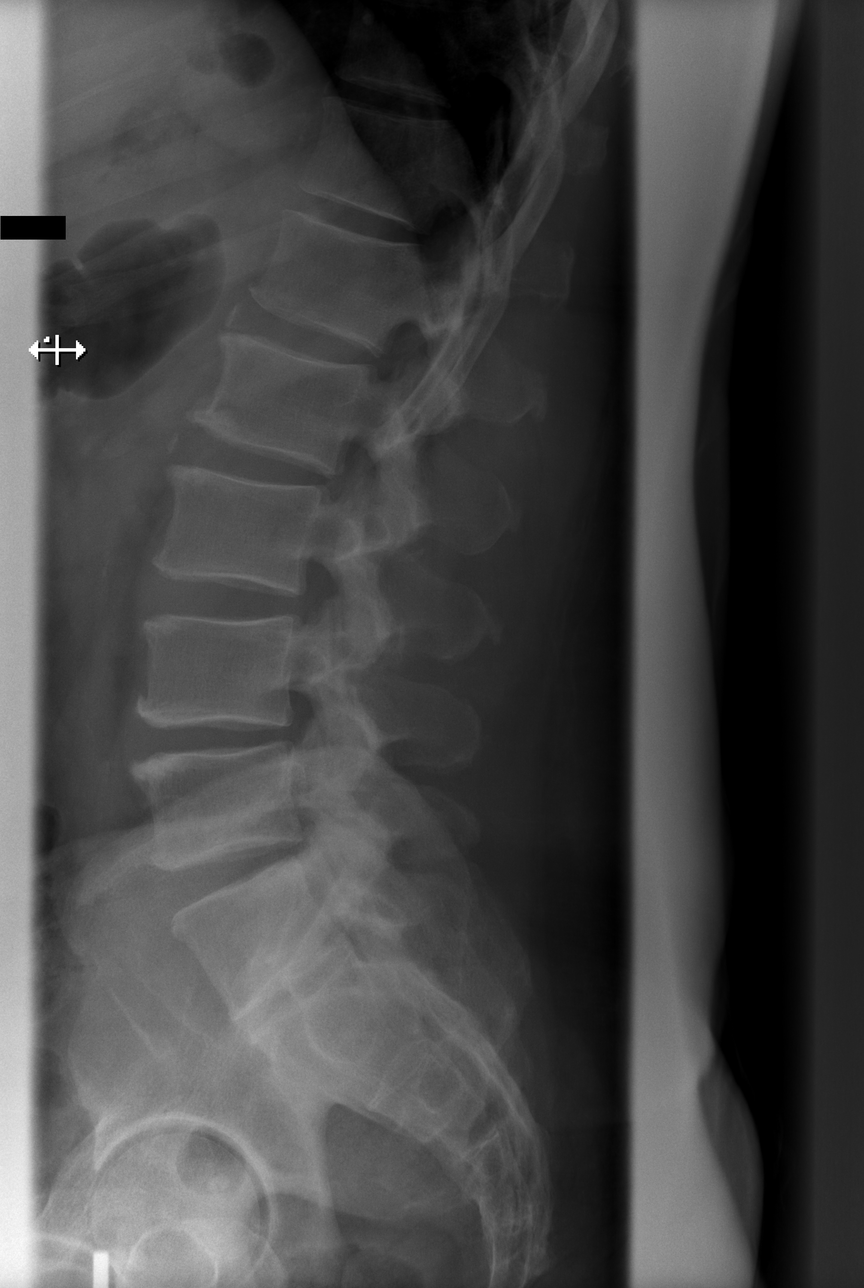

[t lumbar l-5 s-1 spot]
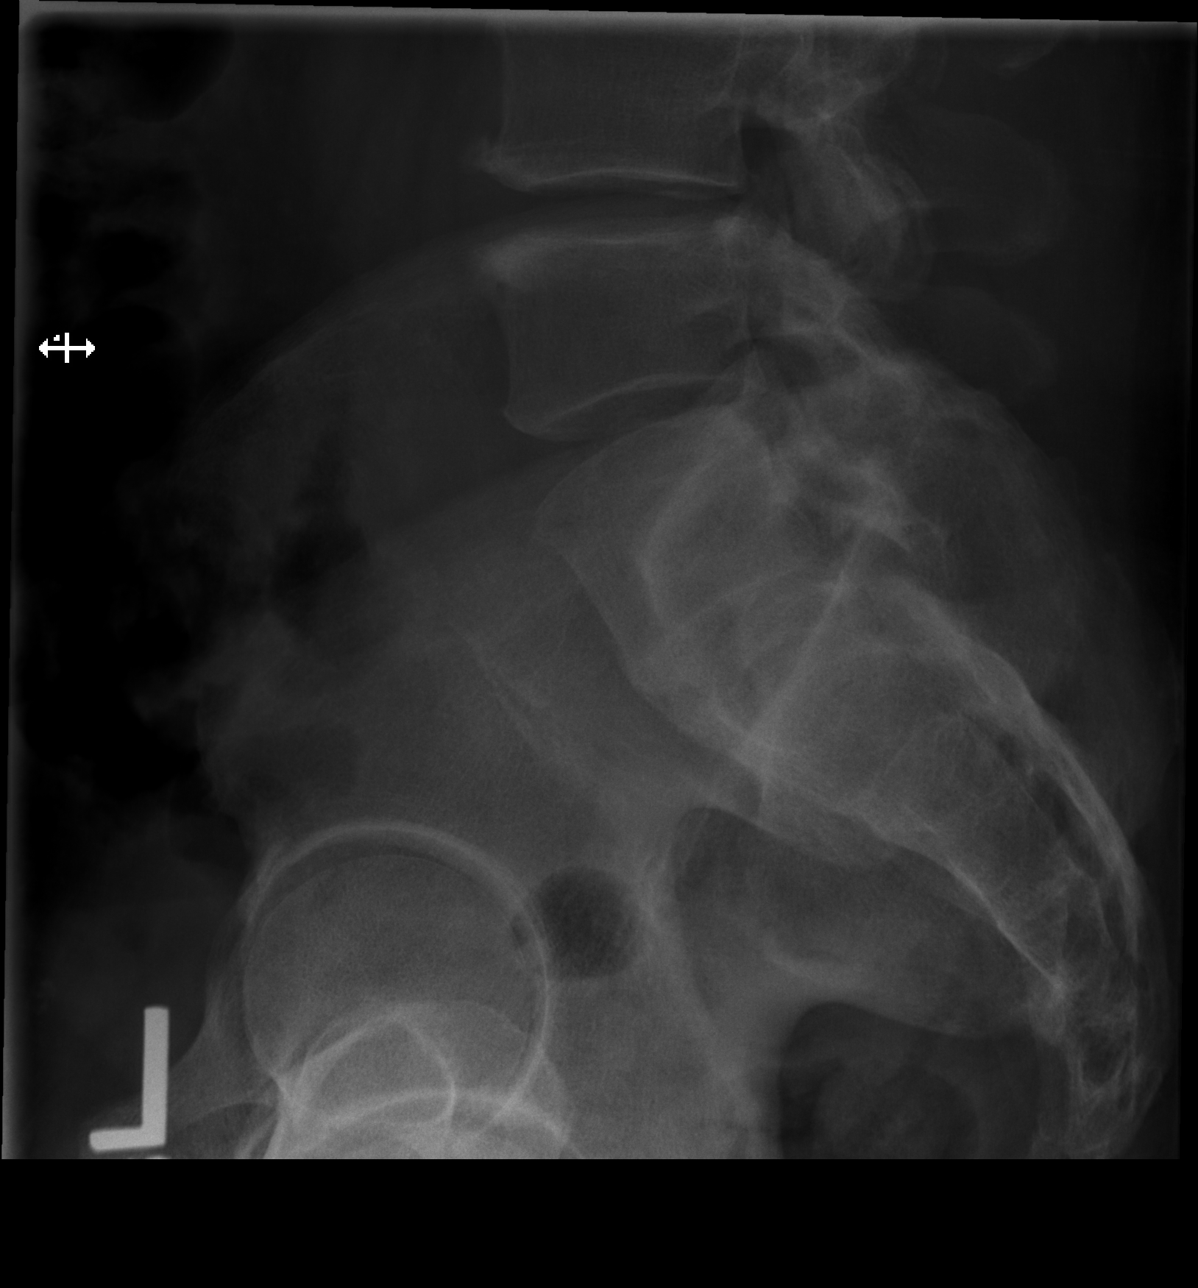

[5 of 5 positions shown; findings below may reference images not displayed]

FINDINGS: Soft tissue structures are unremarkable. No acute bony abnormality.
Normal alignment with diffuse multilevel degenerative change.
Aortoiliac atherosclerotic vascular calcification. Pelvic
calcifications consistent phleboliths.
IMPRESSION: Diffuse multilevel degenerative change.  No acute abnormality.

## 2019-03-08 MED ORDER — METHOCARBAMOL 500 MG PO TABS: 500.0000 mg | ORAL_TABLET | Freq: Two times a day (BID) | ORAL | 0 refills | Status: DC

## 2019-03-08 MED ORDER — HYDROCODONE-ACETAMINOPHEN 5-325 MG PO TABS
2.0000 | ORAL_TABLET | Freq: Once | ORAL | Status: AC
  Administered 2019-03-08: 13:00:00 2 via ORAL
  Filled 2019-03-08: qty 2

## 2019-03-08 MED ORDER — AMLODIPINE BESYLATE 5 MG PO TABS: 5.0000 mg | ORAL_TABLET | Freq: Every day | ORAL | 0 refills | Status: DC

## 2019-03-08 MED ORDER — AMLODIPINE BESYLATE 5 MG PO TABS
5.0000 mg | ORAL_TABLET | Freq: Once | ORAL | Status: AC
  Administered 2019-03-08: 5 mg via ORAL
  Filled 2019-03-08: qty 1

## 2019-03-08 MED ORDER — HYDROCODONE-ACETAMINOPHEN 5-325 MG PO TABS: 1.0000 | ORAL_TABLET | Freq: Four times a day (QID) | ORAL | 0 refills | Status: DC | PRN

## 2019-03-08 NOTE — ED Notes (Signed)
Patient attempting to get urine sample

## 2019-03-08 NOTE — ED Provider Notes (Signed)
[facility] DEPT Provider Note   CSN: [id number] Arrival date & time: 03/08/19  1106     History   Chief Complaint Chief Complaint  Patient presents with  . Back Pain    HPI Antonio Navarro is a 58 y.o. male with history of untreated hypertension, kidney stones who presents with a 1 day history of low back pain that is worse with movement.  Patient denies any numbness or tingling.  He reports some intermittent radiation down the front of both legs.  Denies any saddle anesthesia, loss of bowel or bladder control, fever, history of procedure to back.  Patient has been taking over-the-counter medications without relief.  He reports he is pending lifting a lot for his job and felt like this may be the cause.  Patient denies any urinary symptoms.  He does not feel particularly like a kidney stone.     HPI  Past Medical History:  Diagnosis Date  . Hypertension   . Kidney stones     There are no active problems to display for this patient.   History reviewed. No pertinent surgical history.      Home Medications    Prior to Admission medications   Medication Sig Start Date End Date Taking? Authorizing Provider  amLODipine (NORVASC) 5 MG tablet Take 1 tablet (5 mg total) by mouth daily. 03/08/19   Frederica Kuster, PA-C  aspirin-acetaminophen-caffeine (EXCEDRIN MIGRAINE) [phone] MG per tablet Take 1 tablet by mouth every 6 (six) hours as needed (toothache).    [provider]  HYDROcodone-acetaminophen (NORCO/VICODIN) 5-325 MG tablet Take 1-2 tablets by mouth every 6 (six) hours as needed for severe pain. 03/08/19   Ulises Wolfinger, Bea Graff, PA-C  methocarbamol (ROBAXIN) 500 MG tablet Take 1 tablet (500 mg total) by mouth 2 (two) times daily. 03/08/19   Mamie Hundertmark, Bea Graff, PA-C  ondansetron (ZOFRAN) 8 MG tablet Take 1 tablet (8 mg total) by mouth every 8 (eight) hours as needed for nausea or vomiting. 08/02/16   Street, [place], PA-C    Family  History Family History  Problem Relation Age of Onset  . Cancer Mother   . Hypertension Father     Social History Social History   Tobacco Use  . Smoking status: Current Every Day Smoker    Packs/day: 1.00    Types: Cigarettes  . Smokeless tobacco: Never Used  Substance Use Topics  . Alcohol use: Yes    Comment: occasional  . Drug use: No     Allergies   Patient has no known allergies.   Review of Systems Review of Systems  Constitutional: Negative for chills and fever.  HENT: Negative for facial swelling and sore throat.   Respiratory: Negative for shortness of breath.   Cardiovascular: Negative for chest pain.  Gastrointestinal: Negative for abdominal pain, nausea and vomiting.  Genitourinary: Negative for dysuria.  Musculoskeletal: Positive for back pain.  Skin: Negative for rash and wound.  Neurological: Negative for headaches.  Psychiatric/Behavioral: The patient is not nervous/anxious.      Physical Exam Updated Vital Signs BP (!) 219/129 Comment: patient states he has been off BP meds for awhile-states he quit taking them  Pulse 66   Temp 97.9 F (36.6 C) (Oral)   Resp 16   Wt 99.3 kg   SpO2 98%   BMI 29.70 kg/m   Physical Exam Vitals signs and nursing note reviewed.  Constitutional:      General: He is not in acute distress.  Appearance: He is well-developed. He is not diaphoretic.  HENT:     Head: Normocephalic and atraumatic.     Mouth/Throat:     Pharynx: No oropharyngeal exudate.  Eyes:     General: No scleral icterus.       Right eye: No discharge.        Left eye: No discharge.     Extraocular Movements: Extraocular movements intact.     Conjunctiva/sclera: Conjunctivae normal.     Pupils: Pupils are equal, round, and reactive to light.  Neck:     Musculoskeletal: Normal range of motion and neck supple.     Thyroid: No thyromegaly.  Cardiovascular:     Rate and Rhythm: Normal rate and regular rhythm.     Heart sounds: Normal  heart sounds. No murmur. No friction rub. No gallop.   Pulmonary:     Effort: Pulmonary effort is normal. No respiratory distress.     Breath sounds: Normal breath sounds. No stridor. No wheezing or rales.  Abdominal:     General: Bowel sounds are normal. There is no distension.     Palpations: Abdomen is soft.     Tenderness: There is no abdominal tenderness. There is no guarding or rebound.  Musculoskeletal:       Back:     Comments: Mild midline lumbar tenderness, but worse on bilateral sides No midline cervical or thoracic tenderness   Lymphadenopathy:     Cervical: No cervical adenopathy.  Skin:    General: Skin is warm and dry.     Coloration: Skin is not pale.     Findings: No rash.  Neurological:     Mental Status: He is alert.     Coordination: Coordination normal.     Comments: CN 3-12 intact; normal sensation throughout; 5/5 strength in all 4 extremities; equal bilateral grip strength      ED Treatments / Results  Labs (all labs ordered are listed, but only abnormal results are displayed) Labs Reviewed  URINALYSIS, COMPLETE (UACMP) WITH MICROSCOPIC - Abnormal; Notable for the following components:      Result Value   Leukocytes,Ua SMALL (*)    All other components within normal limits  URINE CULTURE    EKG None  Radiology Dg Lumbar Spine Complete  Result Date: 03/08/2019 CLINICAL DATA:  Low back pain.  No injury. EXAM: LUMBAR SPINE - COMPLETE 4+ VIEW COMPARISON:  CT 08/02/2016. FINDINGS: Soft tissue structures are unremarkable. No acute bony abnormality. Normal alignment with diffuse multilevel degenerative change. Aortoiliac atherosclerotic vascular calcification. Pelvic calcifications consistent phleboliths. IMPRESSION: Diffuse multilevel degenerative change.  No acute abnormality. Electronically Signed   By: Marcello Moores  Register   On: 03/08/2019 13:05    Procedures Procedures (including critical care time)  Medications Ordered in ED Medications   HYDROcodone-acetaminophen (NORCO/VICODIN) 5-325 MG per tablet 2 tablet (2 tablets Oral Given 03/08/19 1235)  amLODipine (NORVASC) tablet 5 mg (5 mg Oral Given 03/08/19 1410)     Initial Impression / Assessment and Plan / ED Course  I have reviewed the triage vital signs and the nursing notes.  Pertinent labs & imaging results that were available during my care of the patient were reviewed by me and considered in my medical decision making (see chart for details).        Patient presenting with low back pain for 1 week.  X-ray shows diffuse multilevel degenerative change and no acute abnormality.  He is feeling better after Vicodin.  UA shows small leukocytes, 11-20 WBCs.  No hematuria.  Low suspicion of kidney stone.  Urine culture sent and would treat if positive considering low back pain.  Low suspicion of AAA as patient had normal aorta 2 years ago on scan and has no abdominal pain.  Patient's pain is worse with movement.  Blood pressure is significantly elevated, however patient has had untreated hypertension for several years.  Patient reports he was last treated when he was incarcerated in 07-08.  He has not been on any anti-hypertensive medications since then.  Will start Norvasc.  Patient strongly encouraged to follow-up with a PCP for recheck as soon as possible.  Advised to avoid salt and make sure he is exercising regularly.  We will treat back pain with Robaxin and short course of Norco.  Patient advised not to drive these medications.  Ice, heat, stretching discussed.  Patient understands and agrees with plan.  Patient discharged in satisfactory condition. I discussed patient case with Dr. Venora Maples who guided the patient's management and agrees with plan.   Final Clinical Impressions(s) / ED Diagnoses   Final diagnoses:  Acute midline low back pain, unspecified whether sciatica present    ED Discharge Orders         Ordered    methocarbamol (ROBAXIN) 500 MG tablet  2 times daily      03/08/19 1354    HYDROcodone-acetaminophen (NORCO/VICODIN) 5-325 MG tablet  Every 6 hours PRN     03/08/19 1354    amLODipine (NORVASC) 5 MG tablet  Daily     03/08/19 1354           Frederica Kuster, PA-C 03/08/19 1726    Jola Schmidt, MD 03/08/19 1902

## 2019-03-08 NOTE — ED Triage Notes (Signed)
Pt states his center lower back has been hurting x 1 week. Denies injury/trauma.

## 2019-03-08 NOTE — Discharge Instructions (Addendum)
Begin taking amlodipine once daily.  Take Robaxin twice daily as needed for muscle pain or spasms.  Take Vicodin 1-2 times daily every 6 hours as needed for severe pain.  Do not drive or operate machinery while taking either of these medications.  Previously, your kidney function was decreased, so you should avoid NSAID medication such as ibuprofen, Advil, Motrin, Aleve, Naprosyn, naproxen, Goody powders.  Use ice and heat alternating 20 minutes on, 20 minutes off.  Attempt the exercises and stretches below 3-4 times daily as tolerated.  Please follow-up and establish care with a primary care provider by calling the numbers below.  Do not drink alcohol, drive, operate machinery or participate in any other potentially dangerous activities while taking opiate pain medication as it may make you sleepy. Do not take this medication with any other sedating medications, either prescription or over-the-counter. If you were prescribed Percocet or Vicodin, do not take these with acetaminophen (Tylenol) as it is already contained within these medications and overdose of Tylenol is dangerous.   This medication is an opiate (or narcotic) pain medication and can be habit forming.  Use it as little as possible to achieve adequate pain control.  Do not use or use it with extreme caution if you have a history of opiate abuse or dependence. This medication is intended for your use only - do not give any to anyone else and keep it in a secure place where nobody else, especially children, have access to it. It will also cause or worsen constipation, so you may want to consider taking an over-the-counter stool softener while you are taking this medication.

## 2019-03-09 LAB — URINE CULTURE: Culture: NO GROWTH

## 2019-11-20 ENCOUNTER — Ambulatory Visit: Payer: Self-pay | Attending: Internal Medicine

## 2019-11-20 DIAGNOSIS — Z23 Encounter for immunization: Secondary | ICD-10-CM

## 2019-11-20 NOTE — Progress Notes (Signed)
   Covid-19 Vaccination Clinic  Name:  Antonio Navarro    MRN: [id number] DOB: [birth year]  11/20/2019  Antonio Navarro was observed post Covid-19 immunization for 15 minutes without incident. He was provided with Vaccine Information Sheet and instruction to access the V-Safe system.   Antonio Navarro was instructed to call 911 with any severe reactions post vaccine: Marland Kitchen Difficulty breathing  . Swelling of face and throat  . A fast heartbeat  . A bad rash all over body  . Dizziness and weakness   Immunizations Administered    Name Date Dose VIS Date Route   Pfizer COVID-19 Vaccine 11/20/2019 10:16 AM 0.3 mL 07/30/2019 Intramuscular   Manufacturer: [employer], [employer]   Lot: [id number]   [place]: [id number]

## 2019-12-14 ENCOUNTER — Ambulatory Visit: Payer: Self-pay | Attending: Internal Medicine

## 2019-12-14 DIAGNOSIS — Z23 Encounter for immunization: Secondary | ICD-10-CM

## 2019-12-14 NOTE — Progress Notes (Signed)
   Covid-19 Vaccination Clinic  Name:  Antonio Navarro    MRN: [id number] DOB: 1961-03-04  12/14/2019  Antonio Navarro was observed post Covid-19 immunization for 15 minutes without incident. He was provided with Vaccine Information Sheet and instruction to access the V-Safe system.   Antonio Navarro was instructed to call 911 with any severe reactions post vaccine: Marland Kitchen Difficulty breathing  . Swelling of face and throat  . A fast heartbeat  . A bad rash all over body  . Dizziness and weakness   Immunizations Administered    Name Date Dose VIS Date Route   Pfizer COVID-19 Vaccine 12/14/2019 10:19 AM 0.3 mL 10/13/2018 Intramuscular   Manufacturer: [employer]   Lot: [id number]   [place]: [id number]

## 2020-05-15 ENCOUNTER — Encounter (HOSPITAL_COMMUNITY): Payer: Self-pay

## 2020-05-15 ENCOUNTER — Ambulatory Visit (HOSPITAL_COMMUNITY)
Admission: EM | Admit: 2020-05-15 | Discharge: 2020-05-15 | Disposition: A | Discharge status: Home / Self Care | Payer: Self-pay | Attending: Family Medicine | Admitting: Family Medicine

## 2020-05-15 ENCOUNTER — Other Ambulatory Visit: Payer: Self-pay

## 2020-05-15 DIAGNOSIS — I10 Essential (primary) hypertension: Secondary | ICD-10-CM

## 2020-05-15 DIAGNOSIS — M5441 Lumbago with sciatica, right side: Secondary | ICD-10-CM

## 2020-05-15 MED ORDER — TRAMADOL HCL 50 MG PO TABS: 50.0000 mg | ORAL_TABLET | Freq: Three times a day (TID) | ORAL | 0 refills | Status: DC | PRN

## 2020-05-15 MED ORDER — CYCLOBENZAPRINE HCL 5 MG PO TABS: 5.0000 mg | ORAL_TABLET | Freq: Every day | ORAL | 0 refills | Status: DC

## 2020-05-15 MED ORDER — PREDNISONE 10 MG (21) PO TBPK: ORAL_TABLET | Freq: Every day | ORAL | 0 refills | Status: DC

## 2020-05-15 MED ORDER — AMLODIPINE BESYLATE 10 MG PO TABS: 10.0000 mg | ORAL_TABLET | Freq: Every day | ORAL | 0 refills | Status: DC

## 2020-05-15 NOTE — Discharge Instructions (Addendum)
Prednisone pack to help with pain. Flexeril as a muscle relaxer, before bed as may cause drowsiness.  Tramadol for breakthrough pain. May cause drowsiness. Please do not take if driving or drinking alcohol.  May cause constipation.  Please restart your blood pressure medication, amlodipine today.  Please follow up with a primary care provider to establish care and to recheck your blood pressure. Call today to get appointment.  Light and regular activity as tolerated.  See exercises provided.  Heat application while active can help with muscle spasms.  Sleep with pillow under your knees.

## 2020-05-15 NOTE — ED Triage Notes (Signed)
Pt states he was experiencing tooth pain and then feels like it moved to his leg. Pt states it is painful with pressure or while standing. Pt is aox4 and ambulates with a slight limp.

## 2020-05-15 NOTE — ED Provider Notes (Signed)
[place]    CSN: [id number] Arrival date & time: 05/15/20  1048      History   Chief Complaint Chief Complaint  Patient presents with   Leg Pain    Rt leg pain     HPI Antonio Navarro is a 59 y.o. male.   Antonio Navarro presents with complaints of right low back pain which radiates down his leg. Started after he drove from [place] back to [place]. He endorses keeping his wallet in his back pocket. Pain with certain movements. No numbness tingling or weakness. No saddle symptoms. He is ambulatory. Has had similar in the past. Has used ibuprofen, which hasn't helped with pain. No calf pain or swelling but back pain radiates down. He states he ran out of his bp medication. No chest pain . No shortness of breath . No headache or vision changes.     ROS per HPI, negative if not otherwise mentioned.      Past Medical History:  Diagnosis Date   Hypertension    Kidney stones     There are no problems to display for this patient.   History reviewed. No pertinent surgical history.     Home Medications    Prior to Admission medications   Medication Sig Start Date End Date Taking? Authorizing Provider  methocarbamol (ROBAXIN) 500 MG tablet Take 1 tablet (500 mg total) by mouth 2 (two) times daily. 03/08/19  Yes Law, Alexandra M, PA-C  amLODipine (NORVASC) 10 MG tablet Take 1 tablet (10 mg total) by mouth daily. 05/15/20   Zigmund Gottron, NP  aspirin-acetaminophen-caffeine (EXCEDRIN MIGRAINE) [phone] MG per tablet Take 1 tablet by mouth every 6 (six) hours as needed (toothache).    [provider]  cyclobenzaprine (FLEXERIL) 5 MG tablet Take 1-2 tablets (5-10 mg total) by mouth at bedtime. 05/15/20   Zigmund Gottron, NP  HYDROcodone-acetaminophen (NORCO/VICODIN) 5-325 MG tablet Take 1-2 tablets by mouth every 6 (six) hours as needed for severe pain. 03/08/19   Law, Bea Graff, PA-C  ondansetron (ZOFRAN) 8 MG tablet Take 1 tablet (8 mg total) by mouth  every 8 (eight) hours as needed for nausea or vomiting. 08/02/16   Street, [place], PA-C  predniSONE (STERAPRED UNI-PAK 21 TAB) 10 MG (21) TBPK tablet Take by mouth daily. Per box instruction 05/15/20   Zigmund Gottron, NP  traMADol (ULTRAM) 50 MG tablet Take 1 tablet (50 mg total) by mouth every 8 (eight) hours as needed. 05/15/20   Zigmund Gottron, NP    Family History Family History  Problem Relation Age of Onset   Cancer Mother    Hypertension Father     Social History Social History   Tobacco Use   Smoking status: Current Every Day Smoker    Packs/day: 1.00    Types: Cigarettes   Smokeless tobacco: Never Used  Substance Use Topics   Alcohol use: Yes    Comment: occasional   Drug use: No     Allergies   Patient has no known allergies.   Review of Systems Review of Systems   Physical Exam Triage Vital Signs ED Triage Vitals  Enc Vitals Group     BP 05/15/20 1240 (!) 222/113     Pulse Rate 05/15/20 1237 78     Resp 05/15/20 1237 17     Temp 05/15/20 1237 (!) 97.4 F (36.3 C)     Temp Source 05/15/20 1237 Oral     SpO2 05/15/20 1237  99 %     Weight --      Height --      Head Circumference --      Peak Flow --      Pain Score 05/15/20 1239 9     Pain Loc --      Pain Edu? --      Excl. in [place]? --    No data found.  Updated Vital Signs BP (!) 222/113 (BP Location: Right Arm)    Pulse 78    Temp (!) 97.4 F (36.3 C) (Oral)    Resp 17    SpO2 99%   Visual Acuity Right Eye Distance:   Left Eye Distance:   Bilateral Distance:    Right Eye Near:   Left Eye Near:    Bilateral Near:     Physical Exam Constitutional:      Appearance: He is well-developed.  Cardiovascular:     Rate and Rhythm: Normal rate.  Pulmonary:     Effort: Pulmonary effort is normal.  Musculoskeletal:     Lumbar back: Tenderness present. Positive right straight leg raise test.     Comments: pain with transition from sit to lay and lay to sit; strength equal  bilaterally; gross sensation intact to lower extremities; right low back musculature with tenderness   Skin:    General: Skin is warm and dry.  Neurological:     Mental Status: He is alert and oriented to person, place, and time.      UC Treatments / Results  Labs (all labs ordered are listed, but only abnormal results are displayed) Labs Reviewed - No data to display  EKG   Radiology No results found.  Procedures Procedures (including critical care time)  Medications Ordered in UC Medications - No data to display  Initial Impression / Assessment and Plan / UC Course  I have reviewed the triage vital signs and the nursing notes.  Pertinent labs & imaging results that were available during my care of the patient were reviewed by me and considered in my medical decision making (see chart for details).     Right sided low back pain with sciatica. No red flag findings. Hypertensive here today, medications refilled, he states he will go pick up and take now. Pain management for back discussed. Expected course of rehab discussed. Return precautions provided. Patient verbalized understanding and agreeable to plan.  Ambulatory out of clinic without difficulty.    Final Clinical Impressions(s) / UC Diagnoses   Final diagnoses:  Acute right-sided low back pain with right-sided sciatica  Hypertension, unspecified type     Discharge Instructions     Prednisone pack to help with pain. Flexeril as a muscle relaxer, before bed as may cause drowsiness.  Tramadol for breakthrough pain. May cause drowsiness. Please do not take if driving or drinking alcohol.  May cause constipation.  Please restart your blood pressure medication, amlodipine today.  Please follow up with a primary care provider to establish care and to recheck your blood pressure. Call today to get appointment.  Light and regular activity as tolerated.  See exercises provided.  Heat application while active can help  with muscle spasms.  Sleep with pillow under your knees.     ED Prescriptions    Medication Sig Dispense Auth. Provider   predniSONE (STERAPRED UNI-PAK 21 TAB) 10 MG (21) TBPK tablet Take by mouth daily. Per box instruction 21 tablet Augusto Gamble B, NP   cyclobenzaprine (FLEXERIL) 5 MG tablet  Take 1-2 tablets (5-10 mg total) by mouth at bedtime. 15 tablet Augusto Gamble B, NP   amLODipine (NORVASC) 10 MG tablet Take 1 tablet (10 mg total) by mouth daily. 30 tablet Augusto Gamble B, NP   traMADol (ULTRAM) 50 MG tablet Take 1 tablet (50 mg total) by mouth every 8 (eight) hours as needed. 10 tablet Zigmund Gottron, NP     I have reviewed the PDMP during this encounter.   Zigmund Gottron, NP 05/15/20 1358

## 2020-05-22 ENCOUNTER — Ambulatory Visit (HOSPITAL_COMMUNITY)
Admission: EM | Admit: 2020-05-22 | Discharge: 2020-05-22 | Disposition: A | Discharge status: Home / Self Care | Payer: Self-pay | Attending: Urgent Care | Admitting: Urgent Care

## 2020-05-22 ENCOUNTER — Other Ambulatory Visit: Payer: Self-pay

## 2020-05-22 ENCOUNTER — Encounter (HOSPITAL_COMMUNITY): Payer: Self-pay | Admitting: *Deleted

## 2020-05-22 DIAGNOSIS — R030 Elevated blood-pressure reading, without diagnosis of hypertension: Secondary | ICD-10-CM

## 2020-05-22 DIAGNOSIS — M5431 Sciatica, right side: Secondary | ICD-10-CM

## 2020-05-22 DIAGNOSIS — M5136 Other intervertebral disc degeneration, lumbar region: Secondary | ICD-10-CM

## 2020-05-22 DIAGNOSIS — M79604 Pain in right leg: Secondary | ICD-10-CM

## 2020-05-22 DIAGNOSIS — I10 Essential (primary) hypertension: Secondary | ICD-10-CM

## 2020-05-22 MED ORDER — TRAMADOL HCL 50 MG PO TABS: 50.0000 mg | ORAL_TABLET | Freq: Four times a day (QID) | ORAL | 0 refills | Status: DC | PRN

## 2020-05-22 MED ORDER — GABAPENTIN 300 MG PO CAPS: 300.0000 mg | ORAL_CAPSULE | Freq: Three times a day (TID) | ORAL | 0 refills | Status: DC

## 2020-05-22 MED ORDER — TIZANIDINE HCL 4 MG PO TABS: 4.0000 mg | ORAL_TABLET | Freq: Three times a day (TID) | ORAL | 0 refills | Status: DC | PRN

## 2020-05-22 NOTE — ED Provider Notes (Signed)
[place]   MRN: [id number] DOB: 1961/03/21  Subjective:   Antonio Navarro is a 59 y.o. male presenting for recheck on persistent right low back/buttock pain that radiates into the right leg.  Has had this for more than a week now.  Was previously seen for this already, given a steroid taper, muscle relaxant, tramadol for breakthrough pain.  Has longstanding history of high blood pressure and was recently started on amlodipine per patient.  States that he does not take the medication every day including this morning.  Denies headache, confusion, chest pain, weakness except for that of his right leg due to his pain.  Denies belly pain, hematuria.  Denies history of heart disease, stroke, kidney disease.  He does not have a PCP, did not have a plan established to follow-up for his high blood pressure.  No current facility-administered medications for this encounter.  Current Outpatient Medications:  .  amLODipine (NORVASC) 10 MG tablet, Take 1 tablet (10 mg total) by mouth daily., Disp: 30 tablet, Rfl: 0 .  cyclobenzaprine (FLEXERIL) 5 MG tablet, Take 1-2 tablets (5-10 mg total) by mouth at bedtime., Disp: 15 tablet, Rfl: 0 .  predniSONE (STERAPRED UNI-PAK 21 TAB) 10 MG (21) TBPK tablet, Take by mouth daily. Per box instruction, Disp: 21 tablet, Rfl: 0 .  traMADol (ULTRAM) 50 MG tablet, Take 1 tablet (50 mg total) by mouth every 8 (eight) hours as needed., Disp: 10 tablet, Rfl: 0 .  aspirin-acetaminophen-caffeine (EXCEDRIN MIGRAINE) [phone] MG per tablet, Take 1 tablet by mouth every 6 (six) hours as needed (toothache)., Disp: , Rfl:  .  HYDROcodone-acetaminophen (NORCO/VICODIN) 5-325 MG tablet, Take 1-2 tablets by mouth every 6 (six) hours as needed for severe pain., Disp: 10 tablet, Rfl: 0 .  methocarbamol (ROBAXIN) 500 MG tablet, Take 1 tablet (500 mg total) by mouth 2 (two) times daily., Disp: 20 tablet, Rfl: 0 .  ondansetron (ZOFRAN) 8 MG tablet, Take 1 tablet (8 mg  total) by mouth every 8 (eight) hours as needed for nausea or vomiting., Disp: 10 tablet, Rfl: 0   No Known Allergies  Past Medical History:  Diagnosis Date  . Hypertension   . Kidney stones      History reviewed. No pertinent surgical history.  Family History  Problem Relation Age of Onset  . Cancer Mother   . Hypertension Father     Social History   Tobacco Use  . Smoking status: Current Every Day Smoker    Packs/day: 1.00    Types: Cigarettes  . Smokeless tobacco: Never Used  Substance Use Topics  . Alcohol use: Yes    Comment: occasional  . Drug use: Yes    Types: Marijuana    Comment: occ    ROS   Objective:   Vitals: BP (!) 192/119 (BP Location: Left Arm)   Pulse 90   Temp 98.3 F (36.8 C) (Oral)   Resp 20   SpO2 100%   Wt Readings from Last 3 Encounters:  03/08/19 219 lb (99.3 kg)  08/02/16 225 lb 1.6 oz (102.1 kg)  11/12/13 220 lb (99.8 kg)   Temp Readings from Last 3 Encounters:  05/22/20 98.3 F (36.8 C) (Oral)  05/15/20 (!) 97.4 F (36.3 C) (Oral)  03/08/19 97.9 F (36.6 C) (Oral)   BP Readings from Last 3 Encounters:  05/22/20 (!) 192/119  05/15/20 (!) 222/113  03/08/19 (!) 219/129   Pulse Readings from Last 3 Encounters:  05/22/20 90  05/15/20  78  03/08/19 66   Physical Exam Constitutional:      General: He is not in acute distress.    Appearance: Normal appearance. He is well-developed and normal weight. He is not ill-appearing, toxic-appearing or diaphoretic.  HENT:     Head: Normocephalic and atraumatic.     Right Ear: External ear normal.     Left Ear: External ear normal.     Nose: Nose normal.     Mouth/Throat:     Pharynx: Oropharynx is clear.  Eyes:     General: No scleral icterus.       Right eye: No discharge.        Left eye: No discharge.     Extraocular Movements: Extraocular movements intact.     Pupils: Pupils are equal, round, and reactive to light.  Cardiovascular:     Rate and Rhythm: Normal rate.   Pulmonary:     Effort: Pulmonary effort is normal.  Musculoskeletal:     Cervical back: Normal range of motion.     Lumbar back: Spasms and tenderness present. No swelling, edema, deformity, signs of trauma, lacerations or bony tenderness. Normal range of motion. Positive right straight leg raise test. Negative left straight leg raise test. No scoliosis.       Back:  Skin:    General: Skin is warm and dry.  Neurological:     Mental Status: He is alert and oriented to person, place, and time.     Cranial Nerves: No cranial nerve deficit.     Motor: No weakness.     Coordination: Coordination normal.     Gait: Gait normal.     Deep Tendon Reflexes: Reflexes normal.  Psychiatric:        Mood and Affect: Mood normal.        Behavior: Behavior normal.        Thought Content: Thought content normal.        Judgment: Judgment normal.      Assessment and Plan :   PDMP not reviewed this encounter.  1. Right leg pain   2. Sciatica of right side   3. Essential hypertension   4. Elevated blood pressure reading   5. Degenerative disc disease, lumbar      Given persistently elevated blood pressure discussed with patient avoiding another round of steroids.  Counseled on scheduling Tylenol, adding gabapentin and continuing to use tramadol for breakthrough pain.  Use tizanidine as a muscle relaxant.  Counseled on back care.  Emphasized need for medical compliance with his blood pressure medication, dietary modifications.  Establish care with a new PCP through [facility] internal medicine for follow-up.  Patient does not have any acute findings suggesting ACS, stroke on exam. Discussed these as risks of uncontrolled HTN. Counseled patient on potential for adverse effects with medications prescribed/recommended today, ER and return-to-clinic precautions discussed, patient verbalized understanding.    Jaynee Eagles, PA-C 05/22/20 1353

## 2020-05-22 NOTE — Discharge Instructions (Addendum)
Do not use any nonsteroidal anti-inflammatories (NSAIDs) like ibuprofen, Motrin, naproxen, Aleve, etc. which are all available over-the-counter.  Please just use Tylenol at a dose of 500mg -650mg  once every 6 hours as needed for your aches and pains. If you still have pain despite taking Tylenol regularly, this is breakthrough pain.  You can use tramadol once every 6 hours for this.  Once your pain is better controlled, switch back to just Tylenol.  Please make sure you are taking the blood pressure medication every single day.  Follow-up and establish care with a new primary care provider through [facility] internal medicine.  For diabetes or elevated blood sugar, please make sure you are avoiding starchy, carbohydrate foods like pasta, breads, pastry, rice, potatoes, desserts. These foods can elevated your blood sugar. Also, avoid sodas, sweet teas, sugary beverages, fruit juices.  Drinking plain water will be much more helpful, try 64 ounces of water daily.  It is okay to flavor your water naturally by cutting cucumber, lemon, mint or lime, placing it in a picture with water and drinking it over a period of 2 to 3 days as long as it remains refrigerated.    For elevated blood pressure, make sure you are monitoring salt in your diet.  Do not eat restaurant foods and limit processed foods at home, prepare/cook your own foods at home.  Processed foods include things like frozen meals preseasoned meats and dinners, deli meats, canned foods as they are high in sodium/salt.  Make sure your pain attention to sodium labels on foods you by at the grocery store.  For seasoning you can use a brand called Mrs. Dash which includes a lot of salt free seasonings.  Salads - kale, spinach, cabbage, spring mix; use seeds like pumpkin seeds or sunflower seeds, almonds, walnuts or pecans; you can also use 1-2 hard boiled eggs in your salads Fruits - avocadoes, berries (blueberries, raspberries, blackberries), apples, oranges,  pomegranate, pear; avoid eating bananas, grapes regularly Vegetables - aspargus, cauliflower, broccoli, green beans, brussel spouts, bell peppers; stay away from starchy vegetables like potatoes, carrots, peas  Regarding meat it is better to eat lean meats and limit your red meat including pork to once a week.  Wild caught fish, chicken breast are good options as they tend to be leaner sources of good protein.   DO NOT EAT ANY FOODS ON THIS LIST THAT YOU ARE ALLERGIC TO.

## 2020-05-22 NOTE — ED Triage Notes (Signed)
Patient in with right leg pain x 1 week. Patient states he is unable to straighten leg and is unable to bear weight on heel. Ambulatory with cane. Patient was seen at [facility] last week and was treated for sciatic nerve.

## 2020-06-02 ENCOUNTER — Emergency Department (HOSPITAL_COMMUNITY)
Admission: EM | Admit: 2020-06-02 | Discharge: 2020-06-02 | Disposition: A | Discharge status: Home / Self Care | Payer: Self-pay | Attending: Emergency Medicine | Admitting: Emergency Medicine

## 2020-06-02 ENCOUNTER — Other Ambulatory Visit: Payer: Self-pay

## 2020-06-02 ENCOUNTER — Encounter (HOSPITAL_COMMUNITY): Payer: Self-pay

## 2020-06-02 DIAGNOSIS — I10 Essential (primary) hypertension: Secondary | ICD-10-CM | POA: Insufficient documentation

## 2020-06-02 DIAGNOSIS — M5431 Sciatica, right side: Secondary | ICD-10-CM | POA: Insufficient documentation

## 2020-06-02 DIAGNOSIS — F17210 Nicotine dependence, cigarettes, uncomplicated: Secondary | ICD-10-CM | POA: Insufficient documentation

## 2020-06-02 DIAGNOSIS — Z79899 Other long term (current) drug therapy: Secondary | ICD-10-CM | POA: Insufficient documentation

## 2020-06-02 MED ORDER — OXYCODONE-ACETAMINOPHEN 5-325 MG PO TABS
1.0000 | ORAL_TABLET | Freq: Once | ORAL | Status: AC
  Administered 2020-06-02: 1 via ORAL
  Filled 2020-06-02: qty 1

## 2020-06-02 MED ORDER — LIDOCAINE 5 % EX PTCH: 1.0000 | MEDICATED_PATCH | CUTANEOUS | 0 refills | Status: DC

## 2020-06-02 NOTE — ED Triage Notes (Signed)
Patient states he went to an UC 3 weeks ago for pain in the right leg. Patient states they told him he had arthritis and sciatica. Patient also reports right ankle swelling. Patient states he has taken all the medication prescribed and is still having pain.

## 2020-06-02 NOTE — ED Provider Notes (Signed)
[facility] DEPT Provider Note   CSN: [id number] Arrival date & time: 06/02/20  1037     History Chief Complaint  Patient presents with  . Leg Pain  . Joint Swelling    Antonio Navarro is a 59 y.o. male with past medical history of hypertension that presents the emergency department today for sciatica.  Patient having right buttock pain that radiates down into his leg with intermittent numbness and tingling.  Patient states that he is also had some right ankle swelling that is intermittent.  Patient has seen in urgent care twice in the past 3 weeks for the same.  They diagnosed him with sciatica, was given steroid taper, gabapentin and tramadol.  Patient did have high blood pressure so stopped using the steroids.  Patient states that pain has been the same for the past 3 weeks, has not worsened or increased.  States that he came to the emergency department to get pain under control.  Has not seen anyone for this besides urgent care.  Denies any falls to the area, heavy lifting, prior back surgery.  Not having any back pain.  No anticoagulation use.  Denies any nausea, vomiting, abdominal pain, chest pain, shortness of breath, hematuria, dysuria.  States that he came to the emergency department for pain relief.  No fevers, chills, night sweats, weight changes.  Patient denies any history of coagulation disorder, recent surgery, no cancer history. HPI     Past Medical History:  Diagnosis Date  . Hypertension   . Kidney stones     There are no problems to display for this patient.   History reviewed. No pertinent surgical history.     Family History  Problem Relation Age of Onset  . Cancer Mother   . Hypertension Father     Social History   Tobacco Use  . Smoking status: Current Every Day Smoker    Packs/day: 1.00    Types: Cigarettes  . Smokeless tobacco: Never Used  Vaping Use  . Vaping Use: Never used  Substance Use Topics  . Alcohol use:  Yes    Comment: occasional  . Drug use: Yes    Types: Marijuana    Comment: occ    Home Medications Prior to Admission medications   Medication Sig Start Date End Date Taking? Authorizing Provider  amLODipine (NORVASC) 10 MG tablet Take 1 tablet (10 mg total) by mouth daily. 05/15/20   Zigmund Gottron, NP  aspirin-acetaminophen-caffeine (EXCEDRIN MIGRAINE) [phone] MG per tablet Take 1 tablet by mouth every 6 (six) hours as needed (toothache).    [provider]  cyclobenzaprine (FLEXERIL) 5 MG tablet Take 1-2 tablets (5-10 mg total) by mouth at bedtime. 05/15/20   Zigmund Gottron, NP  gabapentin (NEURONTIN) 300 MG capsule Take 1 capsule (300 mg total) by mouth 3 (three) times daily. 05/22/20   Jaynee Eagles, PA-C  HYDROcodone-acetaminophen (NORCO/VICODIN) 5-325 MG tablet Take 1-2 tablets by mouth every 6 (six) hours as needed for severe pain. 03/08/19   Law, Bea Graff, PA-C  lidocaine (LIDODERM) 5 % Place 1 patch onto the skin daily. Remove & Discard patch within 12 hours or as directed by MD 06/02/20   Alfredia Client, PA-C  methocarbamol (ROBAXIN) 500 MG tablet Take 1 tablet (500 mg total) by mouth 2 (two) times daily. 03/08/19   Law, Bea Graff, PA-C  ondansetron (ZOFRAN) 8 MG tablet Take 1 tablet (8 mg total) by mouth every 8 (eight) hours as needed for nausea  or vomiting. 08/02/16   Street, [place], PA-C  predniSONE (STERAPRED UNI-PAK 21 TAB) 10 MG (21) TBPK tablet Take by mouth daily. Per box instruction 05/15/20   Augusto Gamble B, NP  tiZANidine (ZANAFLEX) 4 MG tablet Take 1 tablet (4 mg total) by mouth every 8 (eight) hours as needed. 05/22/20   Jaynee Eagles, PA-C  traMADol (ULTRAM) 50 MG tablet Take 1 tablet (50 mg total) by mouth every 6 (six) hours as needed for severe pain. 05/22/20   Jaynee Eagles, PA-C    Allergies    Patient has no known allergies.  Review of Systems   Review of Systems  Constitutional: Negative for diaphoresis, fatigue and fever.  Eyes: Negative for  visual disturbance.  Respiratory: Negative for shortness of breath.   Cardiovascular: Negative for chest pain.  Gastrointestinal: Negative for nausea and vomiting.  Musculoskeletal: Positive for myalgias. Negative for back pain.  Skin: Negative for color change, pallor, rash and wound.  Neurological: Positive for weakness and numbness. Negative for dizziness, seizures, syncope, facial asymmetry, speech difficulty, light-headedness and headaches.  Psychiatric/Behavioral: Negative for behavioral problems and confusion.    Physical Exam Updated Vital Signs BP (!) 159/100 (BP Location: Left Arm)   Pulse 82   Temp 97.9 F (36.6 C) (Oral)   Resp 16   Ht 6' (1.829 m)   Wt 102.1 kg   SpO2 99%   BMI 30.52 kg/m   Physical Exam Constitutional:      General: He is not in acute distress.    Appearance: Normal appearance. He is not ill-appearing, toxic-appearing or diaphoretic.  Cardiovascular:     Rate and Rhythm: Normal rate and regular rhythm.     Pulses: Normal pulses.     Heart sounds: Normal heart sounds.  Pulmonary:     Effort: Pulmonary effort is normal.     Breath sounds: Normal breath sounds.  Abdominal:     General: Abdomen is flat. There is no distension.  Musculoskeletal:        General: Normal range of motion.     Right lower leg: No edema.     Left lower leg: No edema.       Legs:     Comments: Patient with tenderness in area depicted above, no true hip pain. No back pain. Patient with normal range of motion to entire leg with normal sensation.  Normal sensation and strength in toes and foot.  PT pulses bilateral 2+, normal cap refill.  Positive straight leg raise.  Normal gait.  No ankle swelling.  Skin:    General: Skin is warm and dry.     Capillary Refill: Capillary refill takes less than 2 seconds.  Neurological:     General: No focal deficit present.     Mental Status: He is alert and oriented to person, place, and time.  Psychiatric:        Mood and Affect:  Mood normal.        Behavior: Behavior normal.        Thought Content: Thought content normal.     ED Results / Procedures / Treatments   Labs (all labs ordered are listed, but only abnormal results are displayed) Labs Reviewed - No data to display  EKG None  Radiology No results found.  Procedures Procedures (including critical care time)  Medications Ordered in ED Medications  oxyCODONE-acetaminophen (PERCOCET/ROXICET) 5-325 MG per tablet 1 tablet (1 tablet Oral Given 06/02/20 1204)    ED Course  I have reviewed the  triage vital signs and the nursing notes.  Pertinent labs & imaging results that were available during my care of the patient were reviewed by me and considered in my medical decision making (see chart for details).    MDM Rules/Calculators/A&P                          JAVIER GELL is a 59 y.o. male with past medical history of hypertension that presents the emergency department today for sciatica.  Patient has been seen at the urgent care twice in the past 3 weeks for the same, pain has not been getting any better.  Do not think we need imaging at this time, no true hip pain or back pain.  Normal neuro exam including normal gait.  Positive leg raise, symptoms do appear to be sciatica, pain has not been getting worse or better.  Patient is distally neurovascularly intact.  No ankle swelling on exam, PERC positive due to age, other than that negative. [place] 0.  Did discuss that patient needs to do physical therapy and see sports medicine, Percocet given for pain today.  Lidoderm patch given for prescription.  Symptomatic treatment discussed, referral to sports medicine given.  Patient to be discharged.  Upon reassessment patient states that Percocet did take edge off, states that he feels better.  Doubt need for further emergent work up at this time. I explained the diagnosis and have given explicit precautions to return to the ER including for any other new or  worsening symptoms. The patient understands and accepts the medical plan as it's been dictated and I have answered their questions. Discharge instructions concerning home care and prescriptions have been given. The patient is STABLE and is discharged to home in good condition.    Final Clinical Impression(s) / ED Diagnoses Final diagnoses:  Sciatica of right side    Rx / DC Orders ED Discharge Orders         Ordered    lidocaine (LIDODERM) 5 %  Every 24 hours        06/02/20 Wagoner, Nikolaos Maddocks, PA-C 06/02/20 1310    Lacretia Leigh, MD 06/08/20 1144

## 2020-06-02 NOTE — ED Notes (Signed)
Pt made aware of elevated BP. States he would just like to go home.

## 2020-06-02 NOTE — Discharge Instructions (Signed)
You are seen today for sciatica, please use the Lidoderm patches as prescribed.  Please continue to take the medications given to you by urgent care, use the attached instructions.  I also want you to go to see a sports medicine doctor, their information is provided.  Please come back to the emergency department for any new or worsening concerning symptoms.

## 2020-07-19 DIAGNOSIS — I2510 Atherosclerotic heart disease of native coronary artery without angina pectoris: Secondary | ICD-10-CM | POA: Insufficient documentation

## 2020-07-19 DIAGNOSIS — I4901 Ventricular fibrillation: Secondary | ICD-10-CM

## 2020-07-19 DIAGNOSIS — I255 Ischemic cardiomyopathy: Secondary | ICD-10-CM

## 2020-07-19 HISTORY — DX: Ischemic cardiomyopathy: I25.5

## 2020-07-19 HISTORY — DX: Atherosclerotic heart disease of native coronary artery without angina pectoris: I25.10

## 2020-07-19 HISTORY — DX: Ventricular fibrillation: I49.01

## 2020-07-22 ENCOUNTER — Encounter (HOSPITAL_COMMUNITY): Payer: Self-pay | Admitting: Cardiovascular Disease

## 2020-07-22 ENCOUNTER — Inpatient Hospital Stay (HOSPITAL_COMMUNITY)
Admission: EM | Disposition: A | Discharge status: Skilled Nursing Facility | Payer: Self-pay | Source: Home / Self Care | Attending: Internal Medicine

## 2020-07-22 ENCOUNTER — Inpatient Hospital Stay (HOSPITAL_COMMUNITY): Payer: Self-pay

## 2020-07-22 ENCOUNTER — Inpatient Hospital Stay (HOSPITAL_COMMUNITY): Payer: Medicaid Other

## 2020-07-22 ENCOUNTER — Other Ambulatory Visit: Payer: Self-pay

## 2020-07-22 ENCOUNTER — Inpatient Hospital Stay (HOSPITAL_COMMUNITY)
Admission: EM | Admit: 2020-07-22 | Discharge: 2020-11-24 | DRG: 246 | Disposition: A | Discharge status: Skilled Nursing Facility | Payer: Medicaid Other | Attending: Internal Medicine | Admitting: Internal Medicine

## 2020-07-22 ENCOUNTER — Emergency Department (HOSPITAL_COMMUNITY): Payer: Medicaid Other

## 2020-07-22 DIAGNOSIS — J9601 Acute respiratory failure with hypoxia: Secondary | ICD-10-CM | POA: Diagnosis present

## 2020-07-22 DIAGNOSIS — R739 Hyperglycemia, unspecified: Secondary | ICD-10-CM | POA: Diagnosis present

## 2020-07-22 DIAGNOSIS — E669 Obesity, unspecified: Secondary | ICD-10-CM | POA: Diagnosis present

## 2020-07-22 DIAGNOSIS — Z0189 Encounter for other specified special examinations: Secondary | ICD-10-CM

## 2020-07-22 DIAGNOSIS — R1310 Dysphagia, unspecified: Secondary | ICD-10-CM

## 2020-07-22 DIAGNOSIS — E440 Moderate protein-calorie malnutrition: Secondary | ICD-10-CM | POA: Diagnosis present

## 2020-07-22 DIAGNOSIS — J690 Pneumonitis due to inhalation of food and vomit: Secondary | ICD-10-CM | POA: Diagnosis present

## 2020-07-22 DIAGNOSIS — N390 Urinary tract infection, site not specified: Secondary | ICD-10-CM | POA: Diagnosis not present

## 2020-07-22 DIAGNOSIS — E870 Hyperosmolality and hypernatremia: Secondary | ICD-10-CM | POA: Diagnosis present

## 2020-07-22 DIAGNOSIS — N289 Disorder of kidney and ureter, unspecified: Secondary | ICD-10-CM

## 2020-07-22 DIAGNOSIS — Z79899 Other long term (current) drug therapy: Secondary | ICD-10-CM

## 2020-07-22 DIAGNOSIS — I4901 Ventricular fibrillation: Secondary | ICD-10-CM | POA: Diagnosis present

## 2020-07-22 DIAGNOSIS — I462 Cardiac arrest due to underlying cardiac condition: Secondary | ICD-10-CM | POA: Diagnosis present

## 2020-07-22 DIAGNOSIS — Z7952 Long term (current) use of systemic steroids: Secondary | ICD-10-CM

## 2020-07-22 DIAGNOSIS — M549 Dorsalgia, unspecified: Secondary | ICD-10-CM | POA: Diagnosis present

## 2020-07-22 DIAGNOSIS — R2681 Unsteadiness on feet: Secondary | ICD-10-CM

## 2020-07-22 DIAGNOSIS — R531 Weakness: Secondary | ICD-10-CM | POA: Diagnosis not present

## 2020-07-22 DIAGNOSIS — I213 ST elevation (STEMI) myocardial infarction of unspecified site: Secondary | ICD-10-CM

## 2020-07-22 DIAGNOSIS — N1832 Chronic kidney disease, stage 3b: Secondary | ICD-10-CM | POA: Diagnosis present

## 2020-07-22 DIAGNOSIS — E785 Hyperlipidemia, unspecified: Secondary | ICD-10-CM | POA: Diagnosis present

## 2020-07-22 DIAGNOSIS — Z978 Presence of other specified devices: Secondary | ICD-10-CM

## 2020-07-22 DIAGNOSIS — I5043 Acute on chronic combined systolic (congestive) and diastolic (congestive) heart failure: Secondary | ICD-10-CM | POA: Diagnosis present

## 2020-07-22 DIAGNOSIS — G8929 Other chronic pain: Secondary | ICD-10-CM | POA: Diagnosis present

## 2020-07-22 DIAGNOSIS — Z9289 Personal history of other medical treatment: Secondary | ICD-10-CM

## 2020-07-22 DIAGNOSIS — F17210 Nicotine dependence, cigarettes, uncomplicated: Secondary | ICD-10-CM | POA: Diagnosis present

## 2020-07-22 DIAGNOSIS — I255 Ischemic cardiomyopathy: Secondary | ICD-10-CM | POA: Diagnosis present

## 2020-07-22 DIAGNOSIS — E871 Hypo-osmolality and hyponatremia: Secondary | ICD-10-CM | POA: Diagnosis not present

## 2020-07-22 DIAGNOSIS — Z20822 Contact with and (suspected) exposure to covid-19: Secondary | ICD-10-CM | POA: Diagnosis present

## 2020-07-22 DIAGNOSIS — I252 Old myocardial infarction: Secondary | ICD-10-CM

## 2020-07-22 DIAGNOSIS — Z7189 Other specified counseling: Secondary | ICD-10-CM

## 2020-07-22 DIAGNOSIS — R32 Unspecified urinary incontinence: Secondary | ICD-10-CM | POA: Diagnosis not present

## 2020-07-22 DIAGNOSIS — I469 Cardiac arrest, cause unspecified: Secondary | ICD-10-CM | POA: Diagnosis present

## 2020-07-22 DIAGNOSIS — I130 Hypertensive heart and chronic kidney disease with heart failure and stage 1 through stage 4 chronic kidney disease, or unspecified chronic kidney disease: Secondary | ICD-10-CM | POA: Diagnosis present

## 2020-07-22 DIAGNOSIS — K5900 Constipation, unspecified: Secondary | ICD-10-CM | POA: Diagnosis not present

## 2020-07-22 DIAGNOSIS — I2121 ST elevation (STEMI) myocardial infarction involving left circumflex coronary artery: Secondary | ICD-10-CM

## 2020-07-22 DIAGNOSIS — Z515 Encounter for palliative care: Secondary | ICD-10-CM | POA: Diagnosis not present

## 2020-07-22 DIAGNOSIS — I471 Supraventricular tachycardia: Secondary | ICD-10-CM | POA: Diagnosis present

## 2020-07-22 DIAGNOSIS — R509 Fever, unspecified: Secondary | ICD-10-CM

## 2020-07-22 DIAGNOSIS — I2510 Atherosclerotic heart disease of native coronary artery without angina pectoris: Secondary | ICD-10-CM | POA: Diagnosis present

## 2020-07-22 DIAGNOSIS — E162 Hypoglycemia, unspecified: Secondary | ICD-10-CM | POA: Diagnosis not present

## 2020-07-22 DIAGNOSIS — Z452 Encounter for adjustment and management of vascular access device: Secondary | ICD-10-CM

## 2020-07-22 DIAGNOSIS — N179 Acute kidney failure, unspecified: Secondary | ICD-10-CM | POA: Diagnosis not present

## 2020-07-22 DIAGNOSIS — R579 Shock, unspecified: Secondary | ICD-10-CM | POA: Diagnosis present

## 2020-07-22 DIAGNOSIS — J189 Pneumonia, unspecified organism: Secondary | ICD-10-CM

## 2020-07-22 DIAGNOSIS — I472 Ventricular tachycardia: Secondary | ICD-10-CM | POA: Diagnosis present

## 2020-07-22 DIAGNOSIS — Z9181 History of falling: Secondary | ICD-10-CM

## 2020-07-22 DIAGNOSIS — I2119 ST elevation (STEMI) myocardial infarction involving other coronary artery of inferior wall: Principal | ICD-10-CM | POA: Diagnosis present

## 2020-07-22 DIAGNOSIS — Z8249 Family history of ischemic heart disease and other diseases of the circulatory system: Secondary | ICD-10-CM

## 2020-07-22 DIAGNOSIS — B9620 Unspecified Escherichia coli [E. coli] as the cause of diseases classified elsewhere: Secondary | ICD-10-CM | POA: Diagnosis not present

## 2020-07-22 DIAGNOSIS — R109 Unspecified abdominal pain: Secondary | ICD-10-CM

## 2020-07-22 DIAGNOSIS — D7589 Other specified diseases of blood and blood-forming organs: Secondary | ICD-10-CM

## 2020-07-22 DIAGNOSIS — F4321 Adjustment disorder with depressed mood: Secondary | ICD-10-CM | POA: Diagnosis not present

## 2020-07-22 DIAGNOSIS — Z6825 Body mass index (BMI) 25.0-25.9, adult: Secondary | ICD-10-CM

## 2020-07-22 DIAGNOSIS — T85528A Displacement of other gastrointestinal prosthetic devices, implants and grafts, initial encounter: Secondary | ICD-10-CM

## 2020-07-22 DIAGNOSIS — G931 Anoxic brain damage, not elsewhere classified: Secondary | ICD-10-CM

## 2020-07-22 DIAGNOSIS — E872 Acidosis: Secondary | ICD-10-CM | POA: Diagnosis present

## 2020-07-22 DIAGNOSIS — G4700 Insomnia, unspecified: Secondary | ICD-10-CM | POA: Diagnosis not present

## 2020-07-22 DIAGNOSIS — I10 Essential (primary) hypertension: Secondary | ICD-10-CM | POA: Diagnosis not present

## 2020-07-22 DIAGNOSIS — R159 Full incontinence of feces: Secondary | ICD-10-CM | POA: Diagnosis not present

## 2020-07-22 DIAGNOSIS — G9340 Encephalopathy, unspecified: Secondary | ICD-10-CM

## 2020-07-22 DIAGNOSIS — Z87442 Personal history of urinary calculi: Secondary | ICD-10-CM

## 2020-07-22 DIAGNOSIS — S21211A Laceration without foreign body of right back wall of thorax without penetration into thoracic cavity, initial encounter: Secondary | ICD-10-CM | POA: Diagnosis not present

## 2020-07-22 DIAGNOSIS — I2129 ST elevation (STEMI) myocardial infarction involving other sites: Secondary | ICD-10-CM | POA: Diagnosis not present

## 2020-07-22 DIAGNOSIS — I2101 ST elevation (STEMI) myocardial infarction involving left main coronary artery: Secondary | ICD-10-CM

## 2020-07-22 DIAGNOSIS — Z7401 Bed confinement status: Secondary | ICD-10-CM

## 2020-07-22 DIAGNOSIS — Z4659 Encounter for fitting and adjustment of other gastrointestinal appliance and device: Secondary | ICD-10-CM

## 2020-07-22 DIAGNOSIS — I5022 Chronic systolic (congestive) heart failure: Secondary | ICD-10-CM | POA: Diagnosis not present

## 2020-07-22 DIAGNOSIS — Z1611 Resistance to penicillins: Secondary | ICD-10-CM | POA: Diagnosis present

## 2020-07-22 DIAGNOSIS — R21 Rash and other nonspecific skin eruption: Secondary | ICD-10-CM | POA: Diagnosis not present

## 2020-07-22 DIAGNOSIS — R1312 Dysphagia, oropharyngeal phase: Secondary | ICD-10-CM | POA: Diagnosis not present

## 2020-07-22 DIAGNOSIS — R1319 Other dysphagia: Secondary | ICD-10-CM | POA: Diagnosis not present

## 2020-07-22 HISTORY — PX: LEFT HEART CATH AND CORONARY ANGIOGRAPHY: CATH118249

## 2020-07-22 HISTORY — DX: Anoxic brain damage, not elsewhere classified: G93.1

## 2020-07-22 HISTORY — PX: CORONARY/GRAFT ACUTE MI REVASCULARIZATION: CATH118305

## 2020-07-22 HISTORY — DX: Old myocardial infarction: I25.2

## 2020-07-22 LAB — COMPREHENSIVE METABOLIC PANEL
ALT: 97 U/L — ABNORMAL HIGH (ref 0–44)
AST: 112 U/L — ABNORMAL HIGH (ref 15–41)
Albumin: 3 g/dL — ABNORMAL LOW (ref 3.5–5.0)
Alkaline Phosphatase: 78 U/L (ref 38–126)
Anion gap: 19 — ABNORMAL HIGH (ref 5–15)
BUN: 12 mg/dL (ref 6–20)
CO2: 15 mmol/L — ABNORMAL LOW (ref 22–32)
Calcium: 8.6 mg/dL — ABNORMAL LOW (ref 8.9–10.3)
Chloride: 104 mmol/L (ref 98–111)
Creatinine, Ser: 1.65 mg/dL — ABNORMAL HIGH (ref 0.61–1.24)
GFR, Estimated: 48 mL/min — ABNORMAL LOW (ref >60)
Glucose, Bld: 286 mg/dL — ABNORMAL HIGH (ref 70–99)
Potassium: 3.8 mmol/L (ref 3.5–5.1)
Sodium: 138 mmol/L (ref 135–145)
Total Bilirubin: 0.8 mg/dL (ref 0.3–1.2)
Total Protein: 5.7 g/dL — ABNORMAL LOW (ref 6.5–8.1)

## 2020-07-22 LAB — POCT I-STAT 7, (LYTES, BLD GAS, ICA,H+H)
Acid-base deficit: 8 mmol/L — ABNORMAL HIGH (ref 0.0–2.0)
Bicarbonate: 14.7 mmol/L — ABNORMAL LOW (ref 20.0–28.0)
Calcium, Ion: 1.15 mmol/L (ref 1.15–1.40)
HCT: 44 % (ref 39.0–52.0)
Hemoglobin: 15 g/dL (ref 13.0–17.0)
O2 Saturation: 97 %
Patient temperature: 97.4
Potassium: 4 mmol/L (ref 3.5–5.1)
Sodium: 141 mmol/L (ref 135–145)
TCO2: 15 mmol/L — ABNORMAL LOW (ref 22–32)
pCO2 arterial: 23.5 mmHg — ABNORMAL LOW (ref 32.0–48.0)
pH, Arterial: 7.403 (ref 7.350–7.450)
pO2, Arterial: 80 mmHg — ABNORMAL LOW (ref 83.0–108.0)

## 2020-07-22 LAB — BASIC METABOLIC PANEL
Anion gap: 11 (ref 5–15)
Anion gap: 13 (ref 5–15)
BUN: 18 mg/dL (ref 6–20)
BUN: 17 mg/dL (ref 6–20)
CO2: 21 mmol/L — ABNORMAL LOW (ref 22–32)
CO2: 21 mmol/L — ABNORMAL LOW (ref 22–32)
Calcium: 8.5 mg/dL — ABNORMAL LOW (ref 8.9–10.3)
Calcium: 8.7 mg/dL — ABNORMAL LOW (ref 8.9–10.3)
Chloride: 110 mmol/L (ref 98–111)
Chloride: 106 mmol/L (ref 98–111)
Creatinine, Ser: 1.62 mg/dL — ABNORMAL HIGH (ref 0.61–1.24)
Creatinine, Ser: 1.48 mg/dL — ABNORMAL HIGH (ref 0.61–1.24)
GFR, Estimated: 49 mL/min — ABNORMAL LOW (ref >60)
GFR, Estimated: 55 mL/min — ABNORMAL LOW (ref >60)
Glucose, Bld: 149 mg/dL — ABNORMAL HIGH (ref 70–99)
Glucose, Bld: 119 mg/dL — ABNORMAL HIGH (ref 70–99)
Potassium: 4.4 mmol/L (ref 3.5–5.1)
Potassium: 4.1 mmol/L (ref 3.5–5.1)
Sodium: 142 mmol/L (ref 135–145)
Sodium: 140 mmol/L (ref 135–145)

## 2020-07-22 LAB — CBC WITH DIFFERENTIAL/PLATELET
Abs Immature Granulocytes: 0.86 10*3/uL — ABNORMAL HIGH (ref 0.00–0.07)
Basophils Absolute: 0.2 10*3/uL — ABNORMAL HIGH (ref 0.0–0.1)
Basophils Relative: 1 %
Eosinophils Absolute: 0.3 10*3/uL (ref 0.0–0.5)
Eosinophils Relative: 2 %
HCT: 44.5 % (ref 39.0–52.0)
Hemoglobin: 14.3 g/dL (ref 13.0–17.0)
Immature Granulocytes: 5 %
Lymphocytes Relative: 43 %
Lymphs Abs: 7.1 10*3/uL — ABNORMAL HIGH (ref 0.7–4.0)
MCH: 33.2 pg (ref 26.0–34.0)
MCHC: 32.1 g/dL (ref 30.0–36.0)
MCV: 103.2 fL — ABNORMAL HIGH (ref 80.0–100.0)
Monocytes Absolute: 0.9 10*3/uL (ref 0.1–1.0)
Monocytes Relative: 6 %
Neutro Abs: 7.3 10*3/uL (ref 1.7–7.7)
Neutrophils Relative %: 43 %
Platelets: 165 10*3/uL (ref 150–400)
RBC: 4.31 MIL/uL (ref 4.22–5.81)
RDW: 12.4 % (ref 11.5–15.5)
WBC: 16.6 10*3/uL — ABNORMAL HIGH (ref 4.0–10.5)
nRBC: 0 % (ref 0.0–0.2)

## 2020-07-22 LAB — RESP PANEL BY RT-PCR (FLU A&B, COVID) ARPGX2
Influenza A by PCR: NEGATIVE
Influenza B by PCR: NEGATIVE
SARS Coronavirus 2 by RT PCR: NEGATIVE

## 2020-07-22 LAB — GLUCOSE, CAPILLARY
Glucose-Capillary: 160 mg/dL — ABNORMAL HIGH (ref 70–99)
Glucose-Capillary: 114 mg/dL — ABNORMAL HIGH (ref 70–99)
Glucose-Capillary: 106 mg/dL — ABNORMAL HIGH (ref 70–99)

## 2020-07-22 LAB — LACTIC ACID, PLASMA
Lactic Acid, Venous: 9.1 mmol/L (ref 0.5–1.9)
Lactic Acid, Venous: 3.6 mmol/L (ref 0.5–1.9)

## 2020-07-22 LAB — HIV ANTIBODY (ROUTINE TESTING W REFLEX): HIV Screen 4th Generation wRfx: NONREACTIVE

## 2020-07-22 LAB — POCT ACTIVATED CLOTTING TIME: Activated Clotting Time: 148 seconds

## 2020-07-22 LAB — MRSA PCR SCREENING: MRSA by PCR: NEGATIVE

## 2020-07-22 LAB — TROPONIN I (HIGH SENSITIVITY)
Troponin I (High Sensitivity): 818 ng/L (ref <18)
Troponin I (High Sensitivity): 24533 ng/L (ref <18)

## 2020-07-22 IMAGING — DX DG CHEST 1V PORT
1 series · 1 of 1 positions shown · non-contrast
Comparison: [DATE]

CLINICAL DATA: Check central line placement

EXAM:
PORTABLE CHEST 1 VIEW

[chest ap]
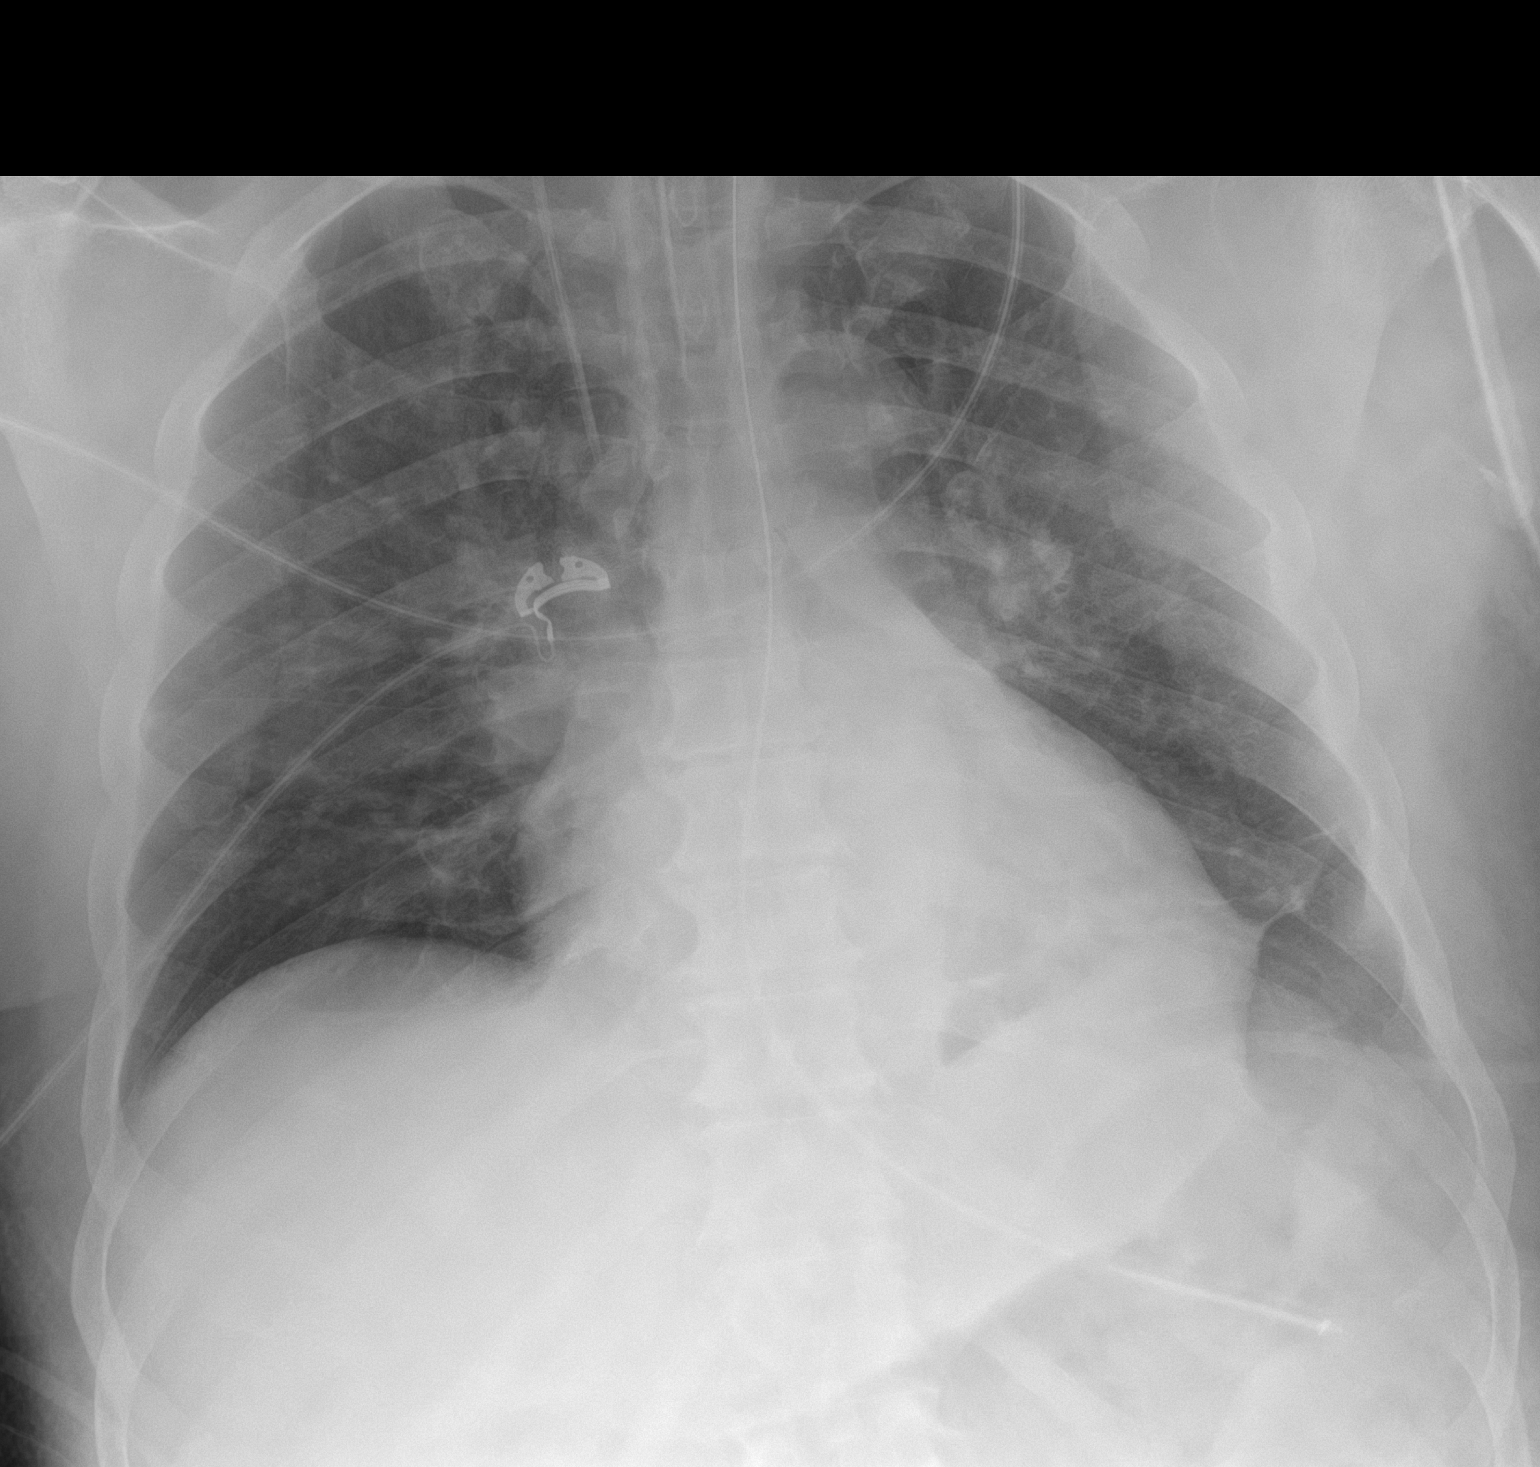

[1 of 1 positions shown; findings below may reference images not displayed]

FINDINGS: Cardiac shadow is stable. Endotracheal tube and gastric catheter are
again noted and stable. New right jugular central line is noted in
the proximal superior vena cava. No pneumothorax is seen. Mild
vascular congestion is noted. Previously seen right basilar
atelectasis has resolved. New left basilar atelectasis is seen.
IMPRESSION: New left basilar atelectasis.

No evidence of pneumothorax following right sided central venous
line placement.

## 2020-07-22 IMAGING — DX DG CHEST 1V PORT
1 series · 1 of 1 positions shown · non-contrast
Comparison: None.

CLINICAL DATA: Intubation

EXAM:
PORTABLE CHEST 1 VIEW

[chest ap]
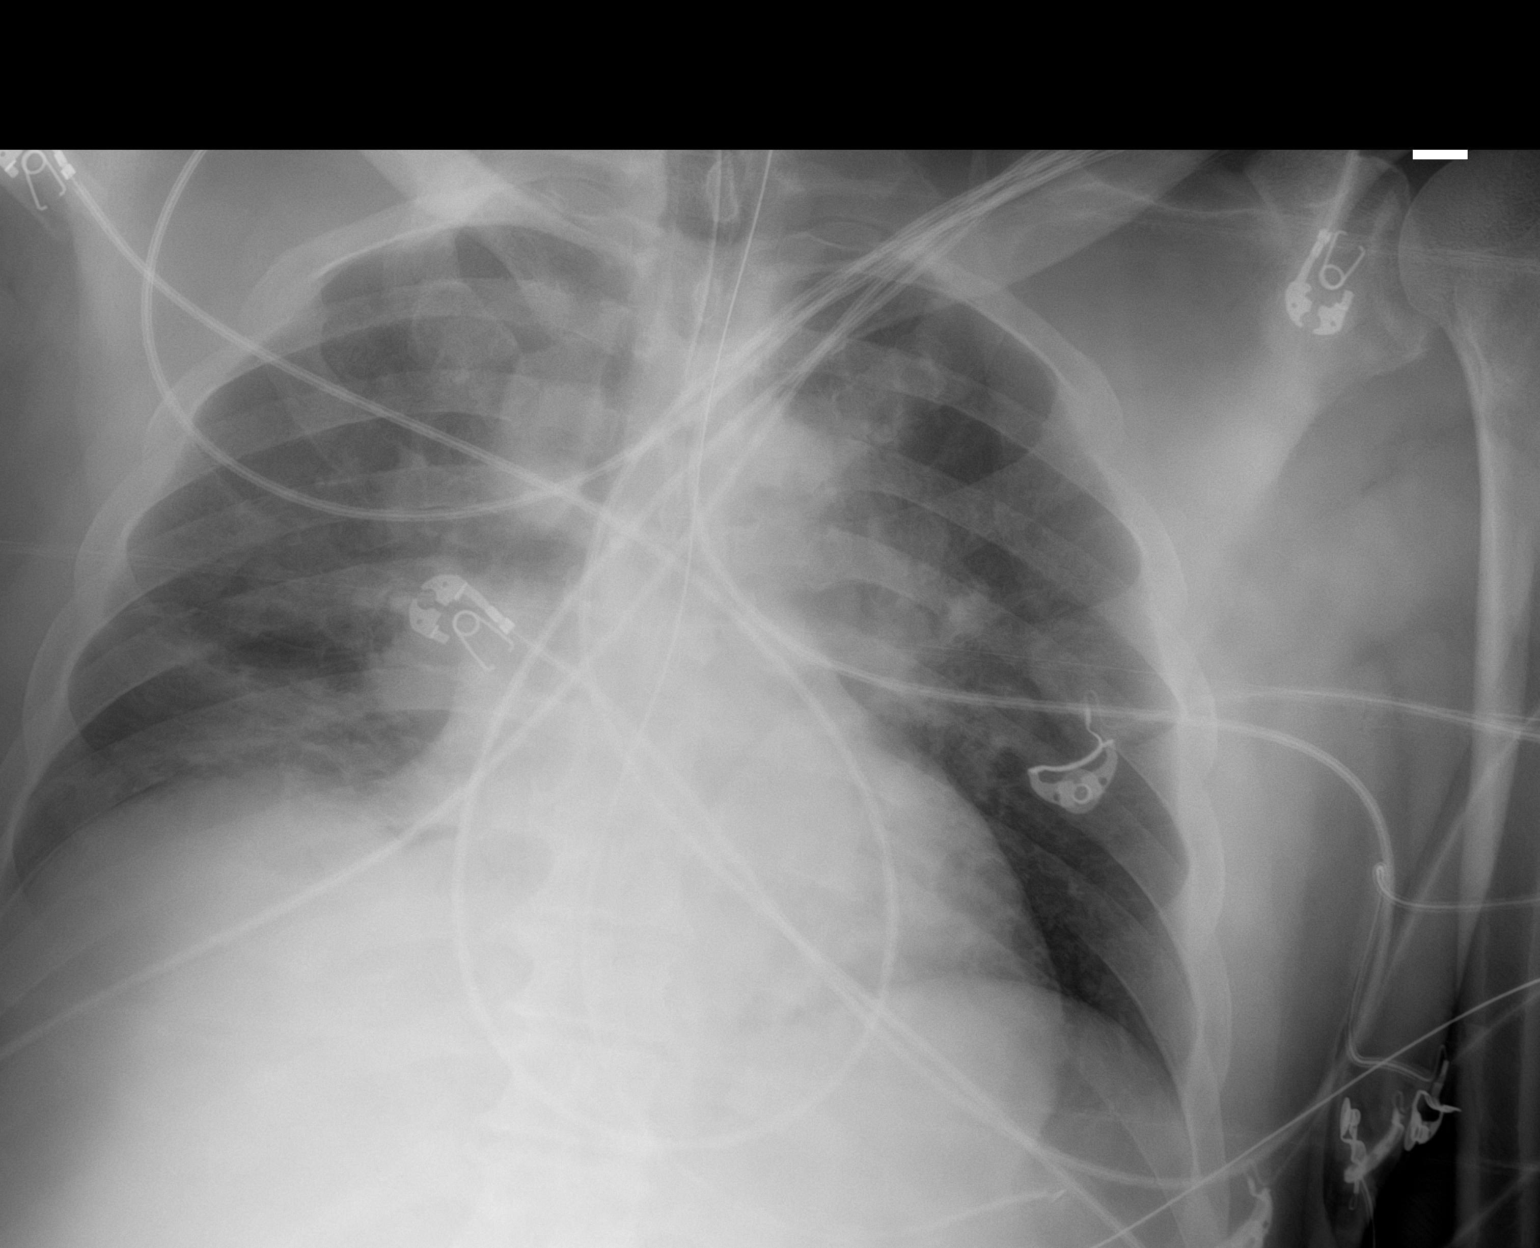

[1 of 1 positions shown; findings below may reference images not displayed]

FINDINGS: Support Apparatus:

--Endotracheal tube: Tip at the level of the clavicular heads.

--Enteric tube:Tip and sideport project over the stomach.

--Catheter(s):None

--Other: None

Linear opacities at the right lung base.
IMPRESSION: 1. Radiographically appropriate position of endotracheal tube and
nasogastric tube.
2. Right basilar atelectasis.

## 2020-07-22 IMAGING — CT CT HEAD W/O CM
4 series · 17 of 47 positions shown, 19 images · non-contrast
Comparison: None.

CLINICAL DATA: Acute neuro deficit.  Cardiac arrest.

EXAM:
CT HEAD WITHOUT CONTRAST
TECHNIQUE: Contiguous axial images were obtained from the base of the skull
through the vertex without intravenous contrast.

[Series 3: head wo · axial · 0.47mm/px · z∈[-115,+5]mm · 7 of 33 slices shown, 9 images]
[im 5/33  brain]
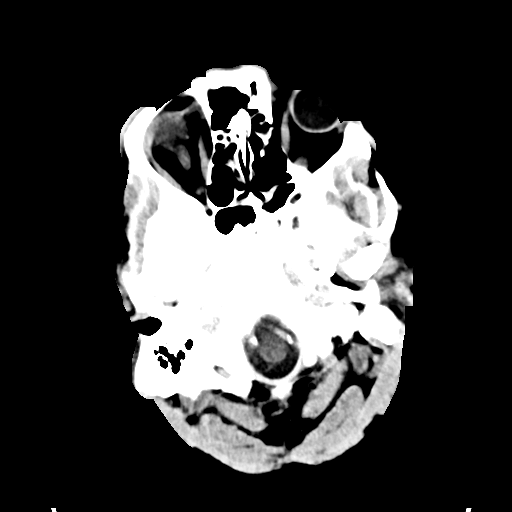
[im 5/33  bone]
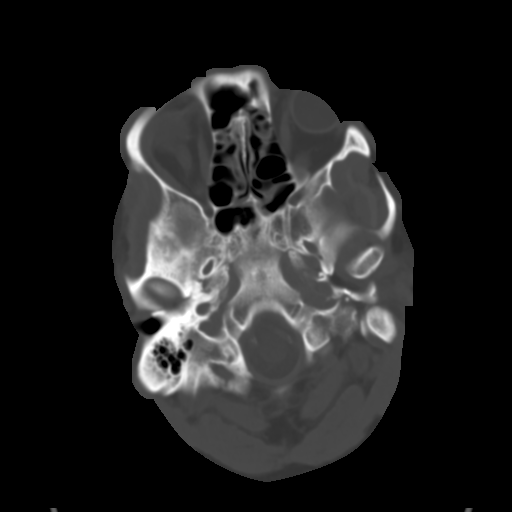
[im 9/33  brain]
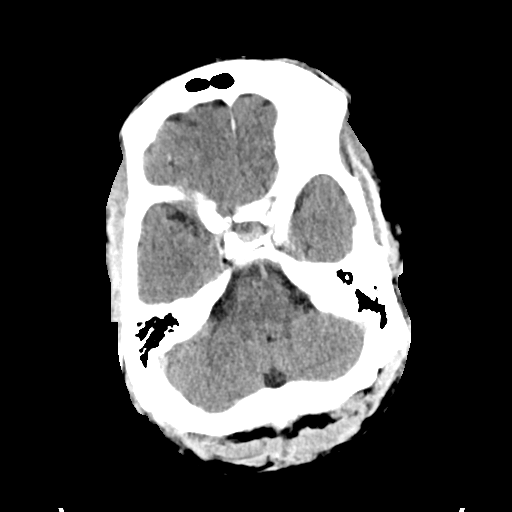
[im 13/33  brain]
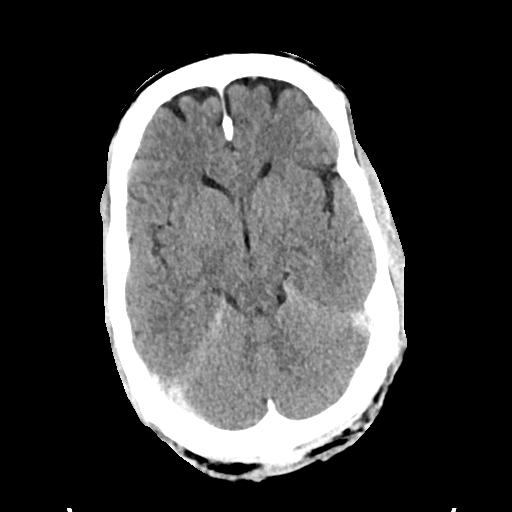
[im 17/33  brain]
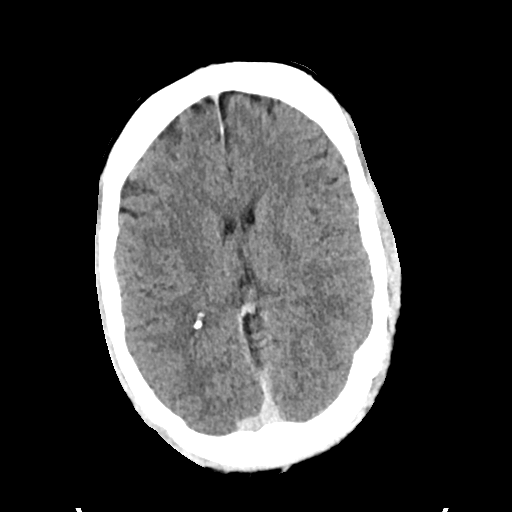
[im 21/33  brain]
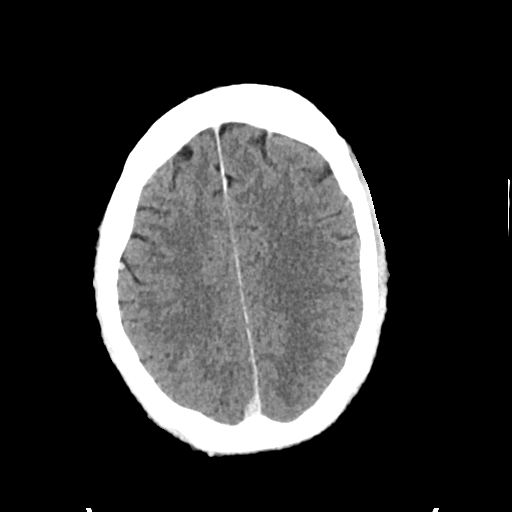
[im 21/33  bone]
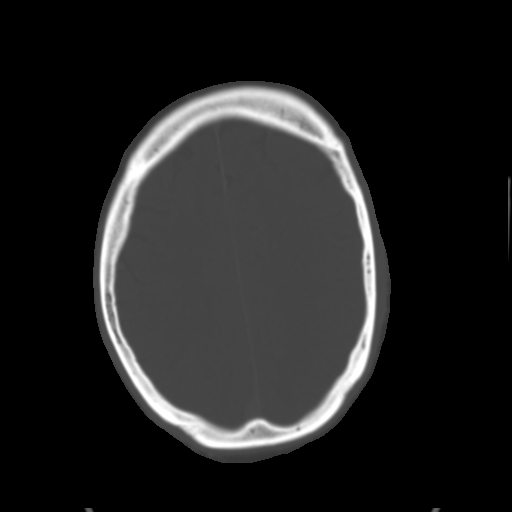
[im 25/33  brain]
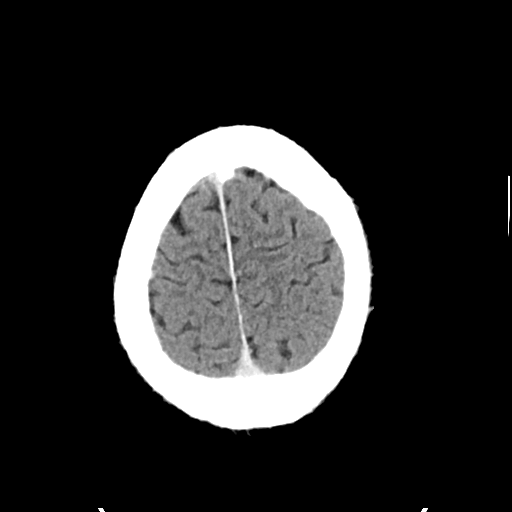
[im 29/33  brain]
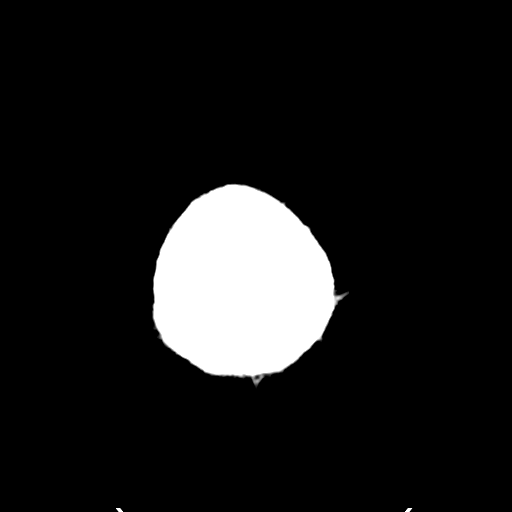

[Series 4: head bone · axial · 0.47mm/px · z∈[-119,-63]mm · 4 of 81 slices shown]
[im 9/81  bone]
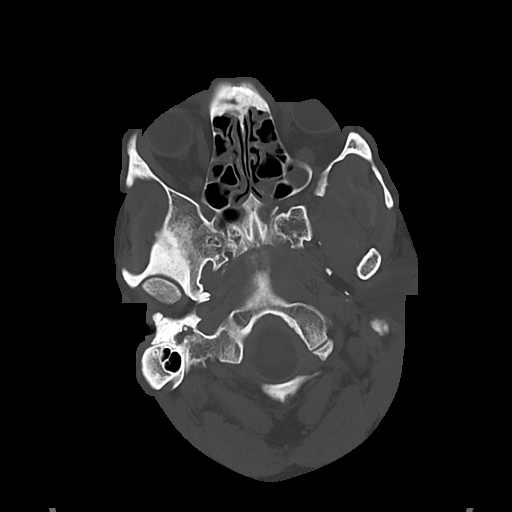
[im 17/81  bone]
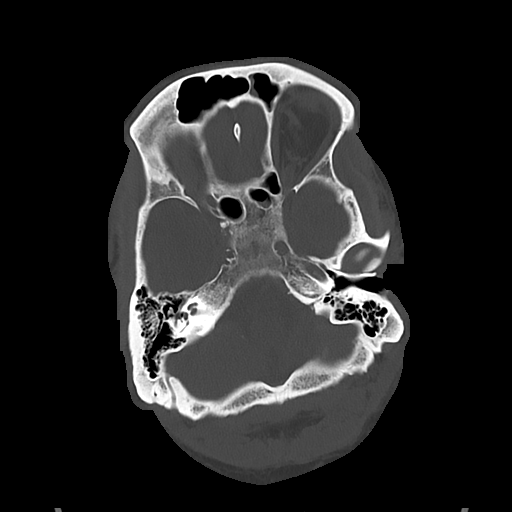
[im 25/81  bone]
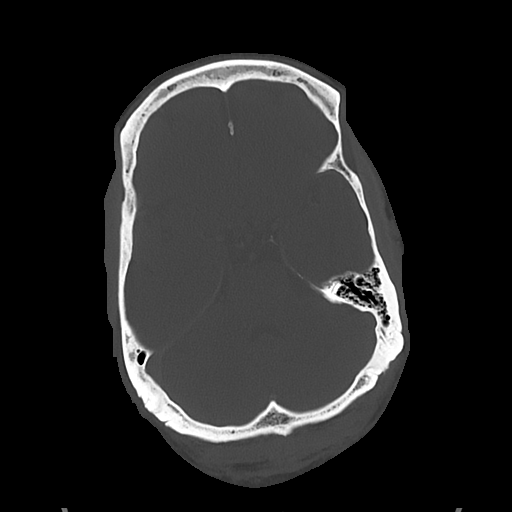
[im 37/81  bone]
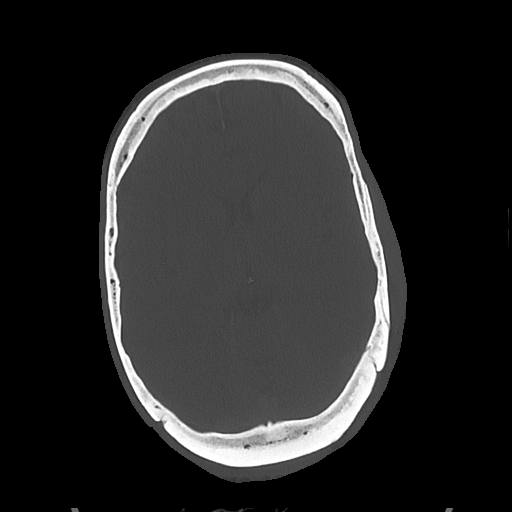

[Series 5: cor soft · coronal · 0.36mm/px · 3 of 77 slices shown]
[im 26/77  brain]
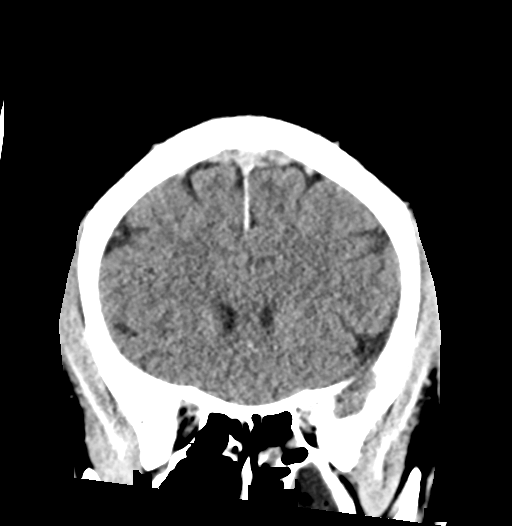
[im 34/77  brain]
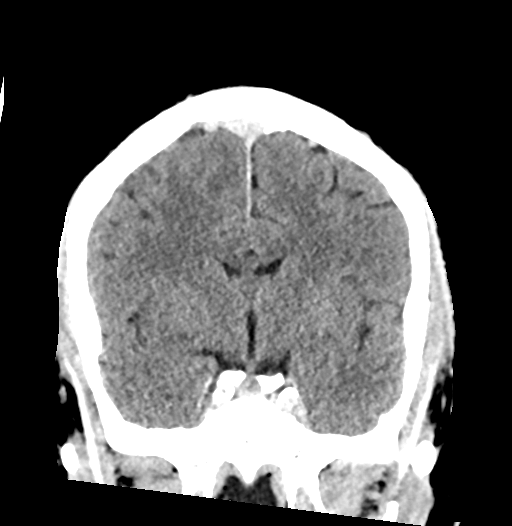
[im 43/77  brain]
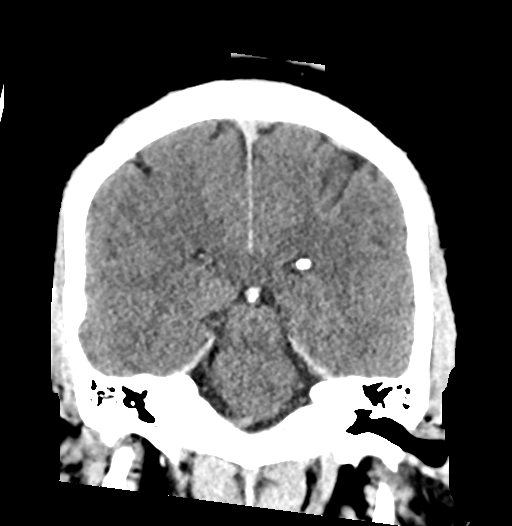

[Series 6: sag soft · sagittal · 0.37mm/px · 3 of 60 slices shown]
[im 23/60  brain]
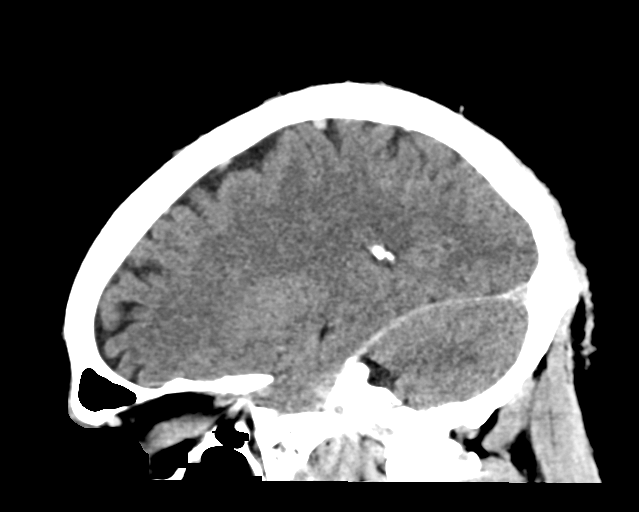
[im 30/60  brain]
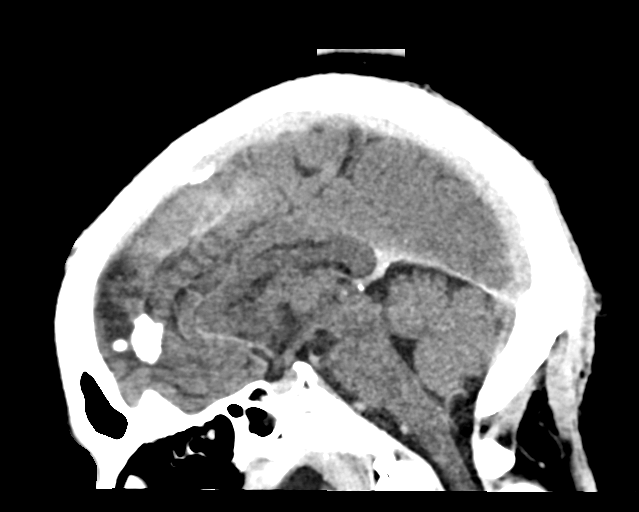
[im 36/60  brain]
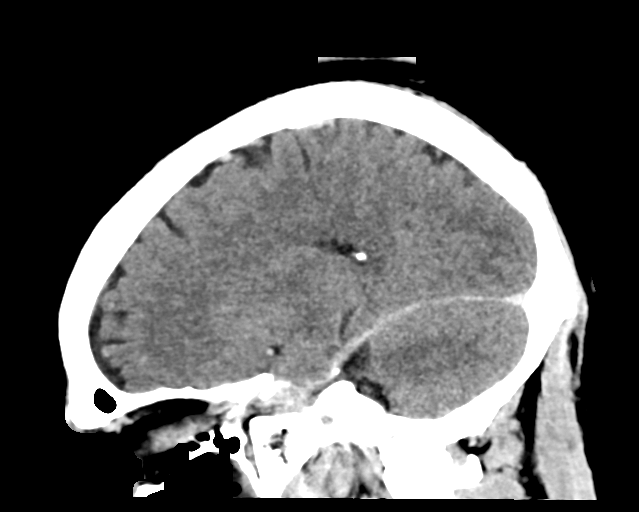

[17 of 47 positions shown; findings below may reference images not displayed]

FINDINGS: Brain: No evidence of acute infarction, hemorrhage, hydrocephalus,
extra-axial collection or mass lesion/mass effect.

Vascular: Vascular enhancement due to coronary angiography earlier
today.

Skull: Negative

Sinuses/Orbits: Mucosal edema in the paranasal sinuses with
air-fluid level in the maxillary sinus bilaterally. Negative orbit

Other: None
IMPRESSION: Negative CT head. Note patient had intravenous contrast earlier
today for coronary angiography

Mucosal edema throughout the paranasal sinuses with air-fluid levels
in the maxillary sinus bilaterally.

## 2020-07-22 SURGERY — CORONARY/GRAFT ACUTE MI REVASCULARIZATION
Anesthesia: LOCAL

## 2020-07-22 MED ORDER — ETOMIDATE 2 MG/ML IV SOLN
INTRAVENOUS | Status: AC
  Filled 2020-07-22: qty 20

## 2020-07-22 MED ORDER — ACETAMINOPHEN 325 MG PO TABS: 650.0000 mg | ORAL_TABLET | ORAL | Status: DC | PRN

## 2020-07-22 MED ORDER — PANTOPRAZOLE SODIUM 40 MG IV SOLR
40.0000 mg | Freq: Every day | INTRAVENOUS | Status: DC
  Administered 2020-07-22 – 2020-07-23 (×2): 40 mg via INTRAVENOUS
  Filled 2020-07-22 (×2): qty 40

## 2020-07-22 MED ORDER — HYDRALAZINE HCL 20 MG/ML IJ SOLN
10.0000 mg | INTRAMUSCULAR | Status: AC | PRN
  Administered 2020-07-22: 10 mg via INTRAVENOUS
  Filled 2020-07-22: qty 1

## 2020-07-22 MED ORDER — AMIODARONE HCL IN DEXTROSE 360-4.14 MG/200ML-% IV SOLN: 60.0000 mg/h | INTRAVENOUS | Status: DC

## 2020-07-22 MED ORDER — FENTANYL BOLUS VIA INFUSION
50.0000 ug | INTRAVENOUS | Status: DC | PRN
  Administered 2020-07-22 – 2020-07-25 (×2): 50 ug via INTRAVENOUS
  Filled 2020-07-22: qty 50

## 2020-07-22 MED ORDER — METOCLOPRAMIDE HCL 5 MG/ML IJ SOLN
10.0000 mg | Freq: Four times a day (QID) | INTRAMUSCULAR | Status: DC
  Administered 2020-07-22 – 2020-07-29 (×29): 10 mg via INTRAVENOUS
  Filled 2020-07-22 (×29): qty 2

## 2020-07-22 MED ORDER — AMIODARONE HCL IN DEXTROSE 360-4.14 MG/200ML-% IV SOLN
INTRAVENOUS | Status: AC | PRN
  Administered 2020-07-22: 60 mg/h via INTRAVENOUS

## 2020-07-22 MED ORDER — NOREPINEPHRINE 4 MG/250ML-% IV SOLN: 0.0000 ug/min | INTRAVENOUS | Status: DC

## 2020-07-22 MED ORDER — POLYETHYLENE GLYCOL 3350 17 G PO PACK
17.0000 g | PACK | Freq: Every day | ORAL | Status: DC | PRN
  Administered 2020-07-25 – 2020-09-05 (×4): 17 g
  Filled 2020-07-22 (×4): qty 1

## 2020-07-22 MED ORDER — HEPARIN SODIUM (PORCINE) 5000 UNIT/ML IJ SOLN
INTRAMUSCULAR | Status: AC
  Administered 2020-07-22: 4000 [IU]
  Filled 2020-07-22: qty 1

## 2020-07-22 MED ORDER — MIDAZOLAM HCL 2 MG/2ML IJ SOLN
INTRAMUSCULAR | Status: DC | PRN
  Administered 2020-07-22: 1 mg via INTRAVENOUS
  Administered 2020-07-22: 4 mg via INTRAVENOUS

## 2020-07-22 MED ORDER — SODIUM CHLORIDE 0.9% FLUSH: 10.0000 mL | INTRAVENOUS | Status: DC | PRN

## 2020-07-22 MED ORDER — IOHEXOL 350 MG/ML SOLN
INTRAVENOUS | Status: AC
  Filled 2020-07-22: qty 1

## 2020-07-22 MED ORDER — TICAGRELOR 90 MG PO TABS
180.0000 mg | ORAL_TABLET | Freq: Once | ORAL | Status: AC
  Administered 2020-07-22: 180 mg via ORAL
  Filled 2020-07-22: qty 2

## 2020-07-22 MED ORDER — FAMOTIDINE IN NACL 20-0.9 MG/50ML-% IV SOLN
20.0000 mg | Freq: Two times a day (BID) | INTRAVENOUS | Status: DC
  Filled 2020-07-22: qty 50

## 2020-07-22 MED ORDER — NITROGLYCERIN 1 MG/10 ML FOR IR/CATH LAB
INTRA_ARTERIAL | Status: AC
  Filled 2020-07-22: qty 10

## 2020-07-22 MED ORDER — ATORVASTATIN CALCIUM 80 MG PO TABS: 80.0000 mg | ORAL_TABLET | Freq: Every day | ORAL | Status: DC

## 2020-07-22 MED ORDER — MIDAZOLAM HCL 2 MG/2ML IJ SOLN
INTRAMUSCULAR | Status: AC
  Filled 2020-07-22: qty 2

## 2020-07-22 MED ORDER — CHLORHEXIDINE GLUCONATE 0.12 % MT SOLN
15.0000 mL | Freq: Two times a day (BID) | OROMUCOSAL | Status: DC
  Administered 2020-07-22 – 2020-11-24 (×153): 15 mL via OROMUCOSAL
  Filled 2020-07-22 (×10): qty 15

## 2020-07-22 MED ORDER — VERAPAMIL HCL 2.5 MG/ML IV SOLN
INTRAVENOUS | Status: AC
  Filled 2020-07-22: qty 2

## 2020-07-22 MED ORDER — POLYETHYLENE GLYCOL 3350 17 G PO PACK: 17.0000 g | PACK | Freq: Every day | ORAL | Status: DC | PRN

## 2020-07-22 MED ORDER — ASPIRIN 300 MG RE SUPP: 300.0000 mg | RECTAL | Status: DC

## 2020-07-22 MED ORDER — SODIUM CHLORIDE 0.9 % IV SOLN: INTRAVENOUS | Status: DC

## 2020-07-22 MED ORDER — SODIUM BICARBONATE 8.4 % IV SOLN
INTRAVENOUS | Status: AC
  Filled 2020-07-22: qty 50

## 2020-07-22 MED ORDER — ATORVASTATIN CALCIUM 80 MG PO TABS
80.0000 mg | ORAL_TABLET | Freq: Every day | ORAL | Status: DC
  Administered 2020-07-22 – 2020-10-18 (×88): 80 mg
  Filled 2020-07-22 (×89): qty 1

## 2020-07-22 MED ORDER — SODIUM CHLORIDE 0.9% FLUSH
10.0000 mL | Freq: Two times a day (BID) | INTRAVENOUS | Status: DC
  Administered 2020-07-22 – 2020-07-24 (×4): 10 mL

## 2020-07-22 MED ORDER — AMIODARONE HCL IN DEXTROSE 360-4.14 MG/200ML-% IV SOLN: 30.0000 mg/h | INTRAVENOUS | Status: DC

## 2020-07-22 MED ORDER — SODIUM CHLORIDE 0.9 % IV SOLN
2.0000 g | INTRAVENOUS | Status: AC
  Administered 2020-07-22 – 2020-07-26 (×5): 2 g via INTRAVENOUS
  Filled 2020-07-22 (×2): qty 20
  Filled 2020-07-22: qty 2
  Filled 2020-07-22 (×3): qty 20

## 2020-07-22 MED ORDER — TICAGRELOR 90 MG PO TABS
ORAL_TABLET | ORAL | Status: DC | PRN
  Administered 2020-07-22: 180 mg

## 2020-07-22 MED ORDER — ASPIRIN 81 MG PO CHEW
81.0000 mg | CHEWABLE_TABLET | Freq: Every day | ORAL | Status: DC
  Administered 2020-07-22 – 2020-10-18 (×88): 81 mg
  Filled 2020-07-22 (×89): qty 1

## 2020-07-22 MED ORDER — SODIUM BICARBONATE 8.4 % IV SOLN
50.0000 meq | Freq: Once | INTRAVENOUS | Status: AC
  Administered 2020-07-22: 50 meq via INTRAVENOUS

## 2020-07-22 MED ORDER — NOREPINEPHRINE 4 MG/250ML-% IV SOLN
2.0000 ug/min | INTRAVENOUS | Status: DC
  Administered 2020-07-22: 5 ug/min via INTRAVENOUS
  Filled 2020-07-22: qty 250

## 2020-07-22 MED ORDER — SUCCINYLCHOLINE CHLORIDE 20 MG/ML IJ SOLN
100.0000 mg | Freq: Once | INTRAMUSCULAR | Status: AC
  Administered 2020-07-22: 100 mg via INTRAVENOUS

## 2020-07-22 MED ORDER — TICAGRELOR 90 MG PO TABS: 90.0000 mg | ORAL_TABLET | Freq: Two times a day (BID) | ORAL | Status: DC

## 2020-07-22 MED ORDER — MIDAZOLAM 50MG/50ML (1MG/ML) PREMIX INFUSION
0.5000 mg/h | INTRAVENOUS | Status: DC
  Administered 2020-07-22: 0.5 mg/h via INTRAVENOUS
  Administered 2020-07-23 (×2): 4 mg/h via INTRAVENOUS
  Administered 2020-07-24: 5 mg/h via INTRAVENOUS
  Filled 2020-07-22 (×4): qty 50

## 2020-07-22 MED ORDER — PROPOFOL BOLUS VIA INFUSION
INTRAVENOUS | Status: DC | PRN
  Administered 2020-07-22 (×2): 5 mg via INTRAVENOUS

## 2020-07-22 MED ORDER — CANGRELOR TETRASODIUM 50 MG IV SOLR
INTRAVENOUS | Status: AC
  Filled 2020-07-22: qty 50

## 2020-07-22 MED ORDER — ROCURONIUM BROMIDE 50 MG/5ML IV SOLN
80.0000 mg | Freq: Once | INTRAVENOUS | Status: AC
  Filled 2020-07-22: qty 8

## 2020-07-22 MED ORDER — DOCUSATE SODIUM 100 MG PO CAPS: 100.0000 mg | ORAL_CAPSULE | Freq: Two times a day (BID) | ORAL | Status: DC | PRN

## 2020-07-22 MED ORDER — INSULIN ASPART 100 UNIT/ML ~~LOC~~ SOLN
0.0000 [IU] | SUBCUTANEOUS | Status: DC
  Administered 2020-07-22 – 2020-07-25 (×7): 2 [IU] via SUBCUTANEOUS
  Administered 2020-07-25: 4 [IU] via SUBCUTANEOUS
  Administered 2020-07-25 (×2): 2 [IU] via SUBCUTANEOUS
  Administered 2020-07-26: 8 [IU] via SUBCUTANEOUS
  Administered 2020-07-26: 4 [IU] via SUBCUTANEOUS
  Administered 2020-07-26 (×3): 2 [IU] via SUBCUTANEOUS
  Administered 2020-07-26 – 2020-07-27 (×3): 4 [IU] via SUBCUTANEOUS
  Administered 2020-07-27 (×3): 2 [IU] via SUBCUTANEOUS
  Administered 2020-07-27 – 2020-07-28 (×3): 4 [IU] via SUBCUTANEOUS
  Administered 2020-07-28: 12 [IU] via SUBCUTANEOUS
  Administered 2020-07-28: 8 [IU] via SUBCUTANEOUS
  Administered 2020-07-28: 12 [IU] via SUBCUTANEOUS
  Administered 2020-07-28: 4 [IU] via SUBCUTANEOUS
  Administered 2020-07-28: 12 [IU] via SUBCUTANEOUS
  Administered 2020-07-29: 05:00:00 8 [IU] via SUBCUTANEOUS

## 2020-07-22 MED ORDER — SODIUM CHLORIDE 0.9 % IV SOLN
4.0000 ug/kg/min | INTRAVENOUS | Status: DC
  Filled 2020-07-22: qty 50

## 2020-07-22 MED ORDER — PROPOFOL 1000 MG/100ML IV EMUL
INTRAVENOUS | Status: AC
  Filled 2020-07-22: qty 100

## 2020-07-22 MED ORDER — HEPARIN SODIUM (PORCINE) 1000 UNIT/ML IJ SOLN
INTRAMUSCULAR | Status: AC
  Filled 2020-07-22: qty 1

## 2020-07-22 MED ORDER — ACETAMINOPHEN 325 MG PO TABS
650.0000 mg | ORAL_TABLET | ORAL | Status: DC | PRN
  Administered 2020-07-24 – 2020-08-23 (×4): 650 mg
  Filled 2020-07-22 (×4): qty 2

## 2020-07-22 MED ORDER — SODIUM CHLORIDE 0.9 % IV SOLN: 250.0000 mL | INTRAVENOUS | Status: DC | PRN

## 2020-07-22 MED ORDER — AMIODARONE HCL IN DEXTROSE 360-4.14 MG/200ML-% IV SOLN
INTRAVENOUS | Status: AC
  Filled 2020-07-22: qty 200

## 2020-07-22 MED ORDER — TICAGRELOR 90 MG PO TABS
90.0000 mg | ORAL_TABLET | Freq: Two times a day (BID) | ORAL | Status: DC
  Administered 2020-07-22 – 2020-08-14 (×46): 90 mg
  Filled 2020-07-22 (×47): qty 1

## 2020-07-22 MED ORDER — FENTANYL 2500MCG IN NS 250ML (10MCG/ML) PREMIX INFUSION
50.0000 ug/h | INTRAVENOUS | Status: DC
  Administered 2020-07-22 (×2): 50 ug/h via INTRAVENOUS
  Administered 2020-07-23 – 2020-07-24 (×2): 100 ug/h via INTRAVENOUS
  Administered 2020-07-25 – 2020-07-26 (×2): 150 ug/h via INTRAVENOUS
  Filled 2020-07-22 (×5): qty 250

## 2020-07-22 MED ORDER — LIDOCAINE HCL (PF) 1 % IJ SOLN
INTRAMUSCULAR | Status: AC
  Filled 2020-07-22: qty 30

## 2020-07-22 MED ORDER — IOHEXOL 350 MG/ML SOLN
INTRAVENOUS | Status: DC | PRN
  Administered 2020-07-22: 100 mL

## 2020-07-22 MED ORDER — ONDANSETRON HCL 4 MG/2ML IJ SOLN
4.0000 mg | Freq: Four times a day (QID) | INTRAMUSCULAR | Status: DC | PRN
  Administered 2020-07-28: 4 mg via INTRAVENOUS

## 2020-07-22 MED ORDER — SODIUM CHLORIDE 0.9 % IV SOLN
INTRAVENOUS | Status: AC | PRN
  Administered 2020-07-22: 4 ug/kg/min via INTRAVENOUS

## 2020-07-22 MED ORDER — LIDOCAINE HCL (PF) 1 % IJ SOLN
INTRAMUSCULAR | Status: DC | PRN
  Administered 2020-07-22: 15 mL

## 2020-07-22 MED ORDER — MIDAZOLAM HCL 2 MG/2ML IJ SOLN: 2.0000 mg | INTRAMUSCULAR | Status: DC | PRN

## 2020-07-22 MED ORDER — HEPARIN SODIUM (PORCINE) 5000 UNIT/ML IJ SOLN
5000.0000 [IU] | Freq: Three times a day (TID) | INTRAMUSCULAR | Status: DC
  Administered 2020-07-22 – 2020-08-17 (×73): 5000 [IU] via SUBCUTANEOUS
  Filled 2020-07-22 (×74): qty 1

## 2020-07-22 MED ORDER — ASPIRIN 300 MG RE SUPP
300.0000 mg | Freq: Once | RECTAL | Status: AC
  Administered 2020-07-22: 300 mg via RECTAL
  Filled 2020-07-22: qty 1

## 2020-07-22 MED ORDER — INSULIN ASPART 100 UNIT/ML ~~LOC~~ SOLN: 0.0000 [IU] | Freq: Three times a day (TID) | SUBCUTANEOUS | Status: DC

## 2020-07-22 MED ORDER — HEPARIN SODIUM (PORCINE) 1000 UNIT/ML IJ SOLN
INTRAMUSCULAR | Status: DC | PRN
  Administered 2020-07-22: 7000 [IU] via INTRAVENOUS

## 2020-07-22 MED ORDER — SODIUM CHLORIDE 0.9% FLUSH: 3.0000 mL | INTRAVENOUS | Status: DC | PRN

## 2020-07-22 MED ORDER — ROCURONIUM BROMIDE 10 MG/ML (PF) SYRINGE
PREFILLED_SYRINGE | INTRAVENOUS | Status: AC
  Administered 2020-07-22: 80 mg
  Filled 2020-07-22: qty 10

## 2020-07-22 MED ORDER — ASPIRIN 81 MG PO CHEW: 81.0000 mg | CHEWABLE_TABLET | Freq: Every day | ORAL | Status: DC

## 2020-07-22 MED ORDER — PROPOFOL 1000 MG/100ML IV EMUL
5.0000 ug/kg/min | INTRAVENOUS | Status: DC
  Administered 2020-07-22: 50 ug/kg/min via INTRAVENOUS
  Administered 2020-07-22: 80 ug/kg/min via INTRAVENOUS
  Administered 2020-07-22 (×2): 70 ug/kg/min via INTRAVENOUS
  Administered 2020-07-23 (×6): 50 ug/kg/min via INTRAVENOUS
  Administered 2020-07-23: 40 ug/kg/min via INTRAVENOUS
  Administered 2020-07-23 – 2020-07-24 (×5): 50 ug/kg/min via INTRAVENOUS
  Administered 2020-07-24: 25 ug/kg/min via INTRAVENOUS
  Filled 2020-07-22 (×3): qty 100
  Filled 2020-07-22 (×2): qty 200
  Filled 2020-07-22 (×11): qty 100

## 2020-07-22 MED ORDER — ORAL CARE MOUTH RINSE
15.0000 mL | OROMUCOSAL | Status: DC
  Administered 2020-07-22 – 2020-07-31 (×92): 15 mL via OROMUCOSAL

## 2020-07-22 MED ORDER — LABETALOL HCL 5 MG/ML IV SOLN: 10.0000 mg | INTRAVENOUS | Status: AC | PRN

## 2020-07-22 MED ORDER — METOPROLOL TARTRATE 5 MG/5ML IV SOLN
5.0000 mg | Freq: Four times a day (QID) | INTRAVENOUS | Status: DC
  Administered 2020-07-23 – 2020-07-25 (×9): 5 mg via INTRAVENOUS
  Filled 2020-07-22 (×9): qty 5

## 2020-07-22 MED ORDER — FENTANYL CITRATE (PF) 100 MCG/2ML IJ SOLN: 50.0000 ug | Freq: Once | INTRAMUSCULAR | Status: DC

## 2020-07-22 MED ORDER — CANGRELOR BOLUS VIA INFUSION
INTRAVENOUS | Status: DC | PRN
  Administered 2020-07-22: 3060 ug via INTRAVENOUS

## 2020-07-22 MED ORDER — SODIUM CHLORIDE 0.9% FLUSH
3.0000 mL | Freq: Two times a day (BID) | INTRAVENOUS | Status: DC
  Administered 2020-07-22 – 2020-11-14 (×114): 3 mL via INTRAVENOUS

## 2020-07-22 MED ORDER — TICAGRELOR 90 MG PO TABS
ORAL_TABLET | ORAL | Status: AC
  Filled 2020-07-22: qty 2

## 2020-07-22 MED ORDER — HEPARIN (PORCINE) IN NACL 1000-0.9 UT/500ML-% IV SOLN
INTRAVENOUS | Status: AC
  Filled 2020-07-22: qty 1000

## 2020-07-22 MED ORDER — CHLORHEXIDINE GLUCONATE CLOTH 2 % EX PADS
6.0000 | MEDICATED_PAD | Freq: Every day | CUTANEOUS | Status: DC
  Administered 2020-07-22 – 2020-08-09 (×19): 6 via TOPICAL

## 2020-07-22 MED ORDER — SODIUM CHLORIDE 0.9 % IV SOLN
250.0000 mL | INTRAVENOUS | Status: DC
  Administered 2020-08-17: 08:00:00 250 mL via INTRAVENOUS

## 2020-07-22 MED ORDER — SODIUM CHLORIDE 0.9 % IV SOLN: INTRAVENOUS | Status: AC

## 2020-07-22 SURGICAL SUPPLY — 14 items
BALLN SAPPHIRE 2.0X12 (BALLOONS) ×2
BALLOON SAPPHIRE 2.0X12 (BALLOONS) IMPLANT
CATH INFINITI 5FR MULTPACK ANG (CATHETERS) ×1 IMPLANT
CATH VISTA GUIDE 6FR XB3.5 (CATHETERS) ×3 IMPLANT
KIT ENCORE 26 ADVANTAGE (KITS) ×1 IMPLANT
KIT HEART LEFT (KITS) ×2 IMPLANT
KIT MICROPUNCTURE NIT STIFF (SHEATH) ×1 IMPLANT
PACK CARDIAC CATHETERIZATION (CUSTOM PROCEDURE TRAY) ×2 IMPLANT
SHEATH PINNACLE 6F 10CM (SHEATH) ×1 IMPLANT
STENT RESOLUTE ONYX 2.25X12 (Permanent Stent) ×1 IMPLANT
TRANSDUCER W/STOPCOCK (MISCELLANEOUS) ×2 IMPLANT
TUBING CIL FLEX 10 FLL-RA (TUBING) ×2 IMPLANT
WIRE ASAHI PROWATER 180CM (WIRE) ×1 IMPLANT
WIRE EMERALD 3MM-J .035X150CM (WIRE) ×1 IMPLANT

## 2020-07-22 NOTE — Progress Notes (Signed)
07/22/2020   CCM Interim Note I have seen and examined patient for post cardiac arrest care.  S Called to cath lab to evaluate patient for admission post arrest.  Details are in Drs. Turner and Glick's notes. DES to circumflex, threw up brillinta given through OGT so cangrelor given   O: Today's Vitals   07/22/20 0500 07/22/20 0505 07/22/20 0525 07/22/20 0549  BP: 138/90     Pulse: 66     Resp: (!) 27     SpO2: 98%  100% 100%  Weight:      Height:      PainSc:  Asleep     Body mass index is 30.5 kg/m.  Constitutional: sedated on vent  Eyes: pupils pinpoint reactive to light not tracking Ears, nose, mouth, and throat: ETT with small bloody secretions Cardiovascular: RRR, ext warm Respiratory: rhonci bilaterally, no accessory muscle use Gastrointestinal: Soft, hypoactive BS Skin: No rashes, normal turgor Neurologic: Decerebrate posturing to stimulation noted, + cough, + oculocephalic Psychiatric: cannot assess    A -OOH Vfib arrest from STEMI s/p PCI -Anoxic encephalopathy - Post arrest shock related to aspiration -Vent dependent hypoxemic respiratory failure -Likely aspiration -Recurrent N/V intolerate to oral DAPT -Hx HTN  P -36C TTM -Head CT, echo, VEEG -DAPT, will try oral again otherwise will need cangrelor, reglan to help with this -Ceftriaxone for aspiration pneumonia, check sputum cx -Vent support, VAP prevention bundle -Heparin DVT ppx -SSI, low threshold for endotool -Hold on TF today -Will update family  Additional 60 minutes critical care time spent caring for patient.

## 2020-07-22 NOTE — Procedures (Signed)
Central Venous Catheter Insertion Procedure Note  Antonio Navarro  [id number]  Apr 14, 1961  Date:07/22/20  Time:11:48 AM   Provider Performing:Emeli Goguen E Cheria Sadiq   Procedure: Insertion of Non-tunneled Central Venous [phone]) with US guidance ([id number])   Indication(s) Medication administration  Consent Risks of the procedure as well as the alternatives and risks of each were explained to the patient and/or caregiver.  Consent for the procedure was obtained and is signed in the bedside chart  Anesthesia Propofol and fentanyl infusions  Timeout Verified patient identification, verified procedure, site/side was marked, verified correct patient position, special equipment/implants available, medications/allergies/relevant history reviewed, required imaging and test results available.  Sterile Technique Maximal sterile technique including full sterile barrier drape, hand hygiene, sterile gown, sterile gloves, mask, hair covering, sterile ultrasound probe cover (if used).  Procedure Description Area of catheter insertion was cleaned with chlorhexidine and draped in sterile fashion.  With real-time ultrasound guidance a central venous catheter was placed into the right internal jugular vein. Nonpulsatile blood flow and easy flushing noted in all ports.  The catheter was sutured in place and sterile dressing applied.  Complications/Tolerance None; patient tolerated the procedure well. Chest X-ray is ordered to verify placement for internal jugular cannulation, and is pending at this time.   EBL Minimal  Specimen(s) None   Eliseo Gum MSN, AGACNP-BC [place] [phone] If no answer, [phone] 07/22/2020, 11:49 AM

## 2020-07-22 NOTE — ED Notes (Signed)
Pt fiance and sister in pt room.

## 2020-07-22 NOTE — Progress Notes (Addendum)
Patient Name: Antonio Navarro   Patient Profile:   Antonio Navarro is a 59 y.o. male with a hx of HTN who is being seen today for the evaluation of post cardiac arrest with inferolateral STEMI--cx by VDRF>> intubation and >> hypoxic encephalopathy  12/4 Cath >>Cx 95>>stent  On cangrelor 2/2 vomiting of ticagrelor  Echo pending     SUBJECTIVE: Intubated and sedated  Past Medical History:  Diagnosis Date  . Hypertension   . Kidney stones     Scheduled Meds:  Scheduled Meds: . aspirin  81 mg Per Tube Daily  . atorvastatin  80 mg Per Tube Daily  . etomidate      . fentaNYL (SUBLIMAZE) injection  50 mcg Intravenous Once  . heparin  5,000 Units Subcutaneous Q8H  . insulin aspart  0-24 Units Subcutaneous Q4H  . metoCLOPramide (REGLAN) injection  10 mg Intravenous Q6H  . metoprolol tartrate  5 mg Intravenous Q6H  . pantoprazole (PROTONIX) IV  40 mg Intravenous QHS  . sodium chloride flush  3 mL Intravenous Q12H  . ticagrelor  90 mg Per Tube BID   Continuous Infusions: . sodium chloride 75 mL/hr at 07/22/20 0954  . sodium chloride    . sodium chloride 10 mL/hr at 07/22/20 0700  . sodium chloride Stopped (07/22/20 0955)  . cefTRIAXone (ROCEPHIN)  IV    . fentaNYL infusion INTRAVENOUS 50 mcg/hr (07/22/20 0849)  . norepinephrine (LEVOPHED) Adult infusion 5 mcg/min (07/22/20 4259)  . propofol (DIPRIVAN) infusion 30 mcg/kg/min (07/22/20 0730)   sodium chloride, acetaminophen, fentaNYL, hydrALAZINE, labetalol, ondansetron (ZOFRAN) IV, polyethylene glycol, sodium chloride flush    PHYSICAL EXAM Vitals:   07/22/20 0500 07/22/20 0525 07/22/20 0549 07/22/20 0745  BP: 138/90     Pulse: 66   74  Resp: (!) 27   (!) 34  SpO2: 98% 100% 100% 100%  Weight:      Height:       Good bilateral air movement.  Warm to touch regular rate and rhythm and without edema  TELEMETRY: Reviewed personnally pt in sinus:      Intake/Output Summary (Last 24 hours) at 07/22/2020  1151 Last data filed at 07/22/2020 0417 Gross per 24 hour  Intake 1000 ml  Output --  Net 1000 ml    LABS: Basic Metabolic Panel: Recent Labs  Lab 07/22/20 0418 07/22/20 0933  NA 138 141  K 3.8 4.0  CL 104  --   CO2 15*  --   GLUCOSE 286*  --   BUN 12  --   CREATININE 1.65*  --   CALCIUM 8.6*  --    Cardiac Enzymes: No results for input(s): CKTOTAL, CKMB, CKMBINDEX, TROPONINI in the last 72 hours. CBC: Recent Labs  Lab 07/22/20 0418 07/22/20 0933  WBC 16.6*  --   NEUTROABS 7.3  --   HGB 14.3 15.0  HCT 44.5 44.0  MCV 103.2*  --   PLT 165  --    PROTIME: No results for input(s): LABPROT, INR in the last 72 hours. Liver Function Tests: Recent Labs    07/22/20 0418  AST 112*  ALT 97*  ALKPHOS 78  BILITOT 0.8  PROT 5.7*  ALBUMIN 3.0*   No results for input(s): LIPASE, AMYLASE in the last 72 hours. BNP: BNP (last 3 results) No results for input(s): BNP in the last 8760 hours.  ProBNP (last 3 results) No results for input(s): PROBNP in the last 8760 hours.  D-Dimer:  No results for input(s): DDIMER in the last 72 hours. Hemoglobin A1C: No results for input(s): HGBA1C in the last 72 hours. Fasting Lipid Panel: No results for input(s): CHOL, HDL, LDLCALC, TRIG, CHOLHDL, LDLDIRECT in the last 72 hours. Thyroid Function Tests: No results for input(s): TSH, T4TOTAL, T3FREE, THYROIDAB in the last 72 hours.  Invalid input(s): FREET3 Anemia Panel: No results for input(s): VITAMINB12, FOLATE, FERRITIN, TIBC, IRON, RETICCTPCT in the last 72 hours.       ASSESSMENT AND PLAN:  Active Problems:   Acute ST elevation myocardial infarction (STEMI) ([place])   Cardiac arrest ([place])   Primary hypertension   Shock circulatory ([facility])  Hemodynamically stable.  Cardiac arrest in the setting of acute MI.  No specific prognostic indications.and so will stop amio   Cooled.  Intubated.  Hopefully will recover well  Signed, Virl Axe MD  07/22/2020

## 2020-07-22 NOTE — H&P (Signed)
NAME:  Antonio Navarro, MRN:  [id number], DOB:  24-Apr-1961, LOS: 0 ADMISSION DATE:  07/22/2020, CONSULTATION DATE: 07/22/2020 REFERRING MD: Dr. Roxanne Mins, CHIEF COMPLAINT: Status post arrest  Brief History   Patient is 59 year old male status post arrest  History of present illness   Patient is a 59 year old male brought to the emergency room after out-of-hospital cardiac arrest.  His wife apparently noticed that while he was sleeping he had agonal breathing and called 911.  He was without resuscitative effort for about 9 minutes.  Patient was found to be apneic and pulls list with initial rhythm of ventricular fibrillation.  He was intubated with [facility] airway and received 6 cardioversion attempts.  He was loaded with amiodarone and got a total of 6 rounds of 1 mg epinephrine.  He had return of spontaneous circulation.  ER physician estimates that time of CPR efforts were 30 minutes.  King airway was replaced in the emergency room. At time of dictation patient is being taken to the cardiac Cath Lab.  On return of sinus rhythm the patient was found to have ST changes in the posterior and inferior leads.  Laboratory show an anion gap of 19 bicarb of 15 lactic acid is pending, glucose is 286, creatinine is 1.65 GFR estimated 45.  Past Medical History   . Hypertension   . Kidney stones      Significant Hospital Events   As above  Consults:  Cardiology, PCCM  Procedures:  As above  Significant Diagnostic Tests:  NA, of note at time of dictation CMP is pending.  Micro Data:  NA  Antimicrobials:  NA  Interim history/subjective:  NA  Objective   Blood pressure 138/90, pulse 66, resp. rate (!) 27, height 6' (1.829 m), weight 102 kg, SpO2 98 %.        Intake/Output Summary (Last 24 hours) at 07/22/2020 0541 Last data filed at 07/22/2020 0417 Gross per 24 hour  Intake 1000 ml  Output --  Net 1000 ml   Filed Weights   07/22/20 0426  Weight: 102 kg    Examination: General:  Intubated male, unresponsive HENT: Within normal limits Lungs: Coarse bilaterally Cardiovascular: Regular rate and rhythm Abdomen: Benign bowel sounds positive Extremities: Within normal limits Neuro: Flat neuro exam (patient had recently received succinylcholine for reintubation) GU: [facility] Problem list   NA  Assessment & Plan:  1.  Status post out-of-hospital cardiac arrest: Initial rhythm was ventricular fibrillation.  Patient found to have ST segment changes on the inferior and posterior leads.  Currently going to cardiac catheterization.  2.  Hyperglycemia: Sliding scale insulin  3.  Acute kidney injury: Monitor  4.  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  Acute respiratory failure: Routine ventilator management protocol  6.  Transaminitis: Secondary to anoxia postarrest, will monitor  7.  Anion gap acidosis most likely secondary to lactic acidosis: Lactic acid will be ordered once on a Cath Lab      Best practice (evaluated daily)   Diet: N.p.o. Pain/Anxiety/Delirium protocol (if indicated): Fentanyl VAP protocol (if indicated): Yes DVT prophylaxis: Yes GI prophylaxis: Yes Glucose control: Sliding scale insulin Mobility: Bedrest Code Status: Full Disposition: Post cardiac catheterization to ICU for further evaluation and treatment  Labs   CBC: Recent Labs  Lab 07/22/20 0418  WBC 16.6*  NEUTROABS 7.3  HGB 14.3  HCT 44.5  MCV 103.2*  PLT  916    Basic Metabolic Panel: Recent Labs  Lab 07/22/20 0418  NA 138  K 3.8  CL 104  CO2 15*  GLUCOSE 286*  BUN 12  CREATININE 1.65*  CALCIUM 8.6*   GFR: Estimated Creatinine Clearance: 60.3 mL/min (A) (by C-G formula based on SCr of 1.65 mg/dL (H)). Recent Labs  Lab 07/22/20 0418  WBC 16.6*  LATICACIDVEN 9.1*    Liver Function Tests: Recent Labs  Lab 07/22/20 0418  AST 112*  ALT 97*  ALKPHOS 78   BILITOT 0.8  PROT 5.7*  ALBUMIN 3.0*   No results for input(s): LIPASE, AMYLASE in the last 168 hours. No results for input(s): AMMONIA in the last 168 hours.  ABG No results found for: PHART, PCO2ART, PO2ART, HCO3, TCO2, ACIDBASEDEF, O2SAT   Coagulation Profile: No results for input(s): INR, PROTIME in the last 168 hours.  Cardiac Enzymes: No results for input(s): CKTOTAL, CKMB, CKMBINDEX, TROPONINI in the last 168 hours.  HbA1C: No results found for: HGBA1C  CBG: No results for input(s): GLUCAP in the last 168 hours.  Review of Systems:   Unable to obtain, patient ventilated postarrest.  Past Medical History  He,  has a past medical history of Hypertension and Kidney stones.   Surgical History   No past surgical history on file.   Social History   reports that he has been smoking cigarettes. He has been smoking about 1.00 pack per day. He has never used smokeless tobacco. He reports current alcohol use. He reports current drug use. Drug: Marijuana.   Family History   His family history includes Cancer in his mother; Hypertension in his father.   Allergies No Known Allergies   Home Medications  Prior to Admission medications   Medication Sig Start Date End Date Taking? Authorizing Provider  amLODipine (NORVASC) 10 MG tablet Take 1 tablet (10 mg total) by mouth daily. 05/15/20   Zigmund Gottron, NP  aspirin-acetaminophen-caffeine (EXCEDRIN MIGRAINE) [phone] MG per tablet Take 1 tablet by mouth every 6 (six) hours as needed (toothache).    [provider]  cyclobenzaprine (FLEXERIL) 5 MG tablet Take 1-2 tablets (5-10 mg total) by mouth at bedtime. 05/15/20   Zigmund Gottron, NP  gabapentin (NEURONTIN) 300 MG capsule Take 1 capsule (300 mg total) by mouth 3 (three) times daily. 05/22/20   Jaynee Eagles, PA-C  lidocaine (LIDODERM) 5 % Place 1 patch onto the skin daily. Remove & Discard patch within 12 hours or as directed by MD 06/02/20   Alfredia Client, PA-C   predniSONE (STERAPRED UNI-PAK 21 TAB) 10 MG (21) TBPK tablet Take by mouth daily. Per box instruction 05/15/20   Augusto Gamble B, NP  tiZANidine (ZANAFLEX) 4 MG tablet Take 1 tablet (4 mg total) by mouth every 8 (eight) hours as needed. 05/22/20   Jaynee Eagles, PA-C  traMADol (ULTRAM) 50 MG tablet Take 1 tablet (50 mg total) by mouth every 6 (six) hours as needed for severe pain. 05/22/20   Jaynee Eagles, PA-C     Critical care time: Over 35 minutes spent bedside evaluation chart review critical care planning

## 2020-07-22 NOTE — Consult Note (Addendum)
Cardiology Consultation:   Patient ID: Antonio Navarro MRN: [id number]; DOB: [birth year]  Admit date: 07/22/2020 Date of Consult: 07/22/2020  Primary Care Provider: Patient, No Pcp Per [place] Cardiologist: [place] Electrophysiologist:  None    Patient Profile:   Antonio Navarro is a 59 y.o. male with a hx of HTN who is being seen today for the evaluation of post cardiac arrest with inferolateral STEMI at the request of Delora Fuel, MD.  History of Present Illness:   Antonio Navarro is a 59yo AAM with a hx of HTN and chronic back pain but no other hx.  Hx is obtained by his fiance this evening.  He was in his USOH until yesterday when he developed vague chest discomfort in the midsternal area but no associated sx of SOB, N/V or diaphoresis.  He took some TUMS.  Later in the evening around 7pm he went to [facility] with his fiance and got a pizza to eat and then felt chest discomfort again.  He watched TV that evening and went to bed around 10:30pm and around 3am his fiance noted what she thought was snoring and she nudged him and he moved and the snoring stopped and she went back to sleep.  About 5 minutes later he made the same noise and she nudged him again and tried to arouse him and he would not respond.  She noticed that his eyes were open but he would not respond. She called EMS but could not do CPR because he was too large for her to move off the bed.  EMS arrived about 10 minutes later and he was found to be in Vfib.  He received a total of 6 rounds of epi and defib x 6 and 1 round of Amio with ROSR.  Now intubated and sedated and hemodynamically stable.  He has a hx of tobacco abuse smoking 1/2ppd and fm hx of father having CHF but no fm hx of SCD.  According to his fiance he had been out working in the yard earlier that week with no problems and was quite active. His fiance says that he has been vaccinated for COVID 19.   Past Medical History:  Diagnosis Date  . Hypertension    . Kidney stones     No past surgical history on file.   Home Medications:  Prior to Admission medications   Medication Sig Start Date End Date Taking? Authorizing Provider  amLODipine (NORVASC) 10 MG tablet Take 1 tablet (10 mg total) by mouth daily. 05/15/20   Zigmund Gottron, NP  aspirin-acetaminophen-caffeine (EXCEDRIN MIGRAINE) [phone] MG per tablet Take 1 tablet by mouth every 6 (six) hours as needed (toothache).    [provider]  cyclobenzaprine (FLEXERIL) 5 MG tablet Take 1-2 tablets (5-10 mg total) by mouth at bedtime. 05/15/20   Zigmund Gottron, NP  gabapentin (NEURONTIN) 300 MG capsule Take 1 capsule (300 mg total) by mouth 3 (three) times daily. 05/22/20   Jaynee Eagles, PA-C  lidocaine (LIDODERM) 5 % Place 1 patch onto the skin daily. Remove & Discard patch within 12 hours or as directed by MD 06/02/20   Alfredia Client, PA-C  predniSONE (STERAPRED UNI-PAK 21 TAB) 10 MG (21) TBPK tablet Take by mouth daily. Per box instruction 05/15/20   Augusto Gamble B, NP  tiZANidine (ZANAFLEX) 4 MG tablet Take 1 tablet (4 mg total) by mouth every 8 (eight) hours as needed. 05/22/20   Jaynee Eagles, PA-C  traMADol (ULTRAM) 50 MG  tablet Take 1 tablet (50 mg total) by mouth every 6 (six) hours as needed for severe pain. 05/22/20   Jaynee Eagles, PA-C    Inpatient Medications: Scheduled Meds: . etomidate       Continuous Infusions:  PRN Meds:   Allergies:   No Known Allergies  Social History:   Social History   Socioeconomic History  . Marital status: Single    Spouse name: Not on file  . Number of children: Not on file  . Years of education: Not on file  . Highest education level: Not on file  Occupational History  . Not on file  Tobacco Use  . Smoking status: Current Every Day Smoker    Packs/day: 1.00    Types: Cigarettes  . Smokeless tobacco: Never Used  Vaping Use  . Vaping Use: Never used  Substance and Sexual Activity  . Alcohol use: Yes    Comment: occasional   . Drug use: Yes    Types: Marijuana    Comment: occ  . Sexual activity: Not on file  Other Topics Concern  . Not on file  Social History Narrative  . Not on file   Social Determinants of Health   Financial Resource Strain:   . Difficulty of Paying Living Expenses: Not on file  Food Insecurity:   . Worried About Charity fundraiser in the Last Year: Not on file  . Ran Out of Food in the Last Year: Not on file  Transportation Needs:   . Lack of Transportation (Medical): Not on file  . Lack of Transportation (Non-Medical): Not on file  Physical Activity:   . Days of Exercise per Week: Not on file  . Minutes of Exercise per Session: Not on file  Stress:   . Feeling of Stress : Not on file  Social Connections:   . Frequency of Communication with Friends and Family: Not on file  . Frequency of Social Gatherings with Friends and Family: Not on file  . Attends Religious Services: Not on file  . Active Member of Clubs or Organizations: Not on file  . Attends Archivist Meetings: Not on file  . Marital Status: Not on file  Intimate Partner Violence:   . Fear of Current or Ex-Partner: Not on file  . Emotionally Abused: Not on file  . Physically Abused: Not on file  . Sexually Abused: Not on file    Family History:    Family History  Problem Relation Age of Onset  . Cancer Mother   . Hypertension Father      ROS:  Please see the history of present illness.   All other ROS reviewed and negative.     Physical Exam/Data:   Vitals:   07/22/20 0450 07/22/20 0455 07/22/20 0500 07/22/20 0549  BP:   138/90   Pulse: 69 64 66   Resp: (!) 22 (!) 25 (!) 27   SpO2: 99% 99% 98% 100%  Weight:      Height:        Intake/Output Summary (Last 24 hours) at 07/22/2020 0555 Last data filed at 07/22/2020 0417 Gross per 24 hour  Intake 1000 ml  Output --  Net 1000 ml   Last 3 Weights 07/22/2020 06/02/2020 03/08/2019  Weight (lbs) 224 lb 13.9 oz 225 lb 219 lb  Weight (kg)  102 kg 102.059 kg 99.338 kg     Body mass index is 30.5 kg/m.  General:  Well nourished, well developed intubated and sedated HEENT:  normal Lymph: no adenopathy Neck: no JVD Endocrine:  No thryomegaly Vascular: No carotid bruits; FA pulses 2+ bilaterally without bruits  Cardiac:  normal S1, S2; RRR; no murmur  Lungs:  clear to auscultation bilaterally, no wheezing, rhonchi or rales  Abd: soft, nontender, no hepatomegaly  Ext: no edema Musculoskeletal:  No deformities Skin: warm and dry  Neuro:  Cannot assess Psych:  Cannot assess  EKG:  The EKG was personally reviewed and demonstrates:  NSR with inferolateral ST elevated and ST depression in V1-V2  Telemetry:  Telemetry was personally reviewed and demonstrates:  NSR  Relevant CV Studies: none  Laboratory Data:  High Sensitivity Troponin:   Recent Labs  Lab 07/22/20 0418  TROPONINIHS 818*     Chemistry Recent Labs  Lab 07/22/20 0418  NA 138  K 3.8  CL 104  CO2 15*  GLUCOSE 286*  BUN 12  CREATININE 1.65*  CALCIUM 8.6*  GFRNONAA 48*  ANIONGAP 19*    Recent Labs  Lab 07/22/20 0418  PROT 5.7*  ALBUMIN 3.0*  AST 112*  ALT 97*  ALKPHOS 78  BILITOT 0.8   Hematology Recent Labs  Lab 07/22/20 0418  WBC 16.6*  RBC 4.31  HGB 14.3  HCT 44.5  MCV 103.2*  MCH 33.2  MCHC 32.1  RDW 12.4  PLT 165   BNPNo results for input(s): BNP, PROBNP in the last 168 hours.  DDimer No results for input(s): DDIMER in the last 168 hours.   Radiology/Studies:  DG Chest Portable 1 View  Result Date: 07/22/2020 CLINICAL DATA:  Intubation EXAM: PORTABLE CHEST 1 VIEW COMPARISON:  None. FINDINGS: Support Apparatus: --Endotracheal tube: Tip at the level of the clavicular heads. --Enteric tube:Tip and sideport project over the stomach. --Catheter(s):None --Other: None Linear opacities at the right lung base. IMPRESSION: 1. Radiographically appropriate position of endotracheal tube and nasogastric tube. 2. Right basilar  atelectasis. Electronically Signed   By: Ulyses Jarred M.D.   On: 07/22/2020 04:46     Assessment and Plan:   1. Cardiac arrest -secondary to Acute inferolateral STEMI -no CPR for 10 minutes and then ROSR after 30 minutes and 6 rounds of epi/defib and Amio -currently intubated and sedated -hemodynamically stable -Vent management per CCM  2.  Acute inferolateral STEMI -had CP off and on yesterday and thought it was indigestion -no associated sx of N/V/diaphoresis -CRFs include HTN, obesity and ongoing tobacco use -EKG shows inferolateral ST elevation with reciprocal change in V1 and V2 -initial hsTrop 818 -discussed case with Dr. Gwenlyn Found and will take emergently to cath lab to define coronary anatomy -continue IV heparin gtt, ASA -start high dose statin and Lopressor 25mg  BID -check HbgA1C and FLP -2D echo to assess LVF  3.  HTN -BP elevated at 144/139mmhg -start Lopressor 25mg  BID -add ARB if BP tolerates BB and renal function improves  4. Tobacco abuse -needs smoking cessation counseling  5.  AKI -SCr 1.65 likely related to cardiac arrest -continue to monitor  6.  Elevated BS -no hx of DM -check HbgA1C -qac and hs BS with SS Insulin coverage per CCM    The patient is critically ill with multiple organ systems failure and requires high complexity decision making for assessment and support, frequent evaluation and titration of therapies, application of advanced monitoring technologies and extensive interpretation of multiple databases. Critical Care Time devoted to patient care services described in this note independent of APP time is 60 minutes with >50% of time spent in direct patient care.  TIMI Risk Score for ST  Elevation MI:   The patient's TIMI risk score is 1, which indicates a 1.6% risk of all cause mortality at 30 days.    For questions or updates, please contact [place] Please consult [email or web address] for contact info under    Signed, Fransico Him, MD  07/22/2020 5:55 AM

## 2020-07-22 NOTE — Progress Notes (Signed)
LTM EEG hooked up and running - no initial skin breakdown - push button tested - neuro notified. Event button tested

## 2020-07-22 NOTE — Progress Notes (Signed)
EEG complete - results pending 

## 2020-07-22 NOTE — ED Triage Notes (Addendum)
Pt BIB [facility] - post CPR.  EMS reports that pt had chest pain before going to bed, woke up to pt having agonal breathing.  EMS on scene and started CPR at 03:12. Pt was shocked 6 times with EMS, given 6 of epi, 450 amio, and 2 bags of NS. EMS got pulses back at 03:41.  Pt has a IO in R tibia, 18LH, king airway - placed by EMS.  BP 150 palpable with EMS

## 2020-07-22 NOTE — Progress Notes (Signed)
Right femoral sheath removed at 1848. Pressure held manually for 20 min. Site is level zero. BP cycled q3 min during hold and remained stable throughout, patient tolerated procedure well.

## 2020-07-22 NOTE — Interval H&P Note (Signed)
History and Physical Interval Note:  07/22/2020 6:31 AM  Antonio Navarro  has presented today for surgery, with the diagnosis of STEMI.  The various methods of treatment have been discussed with the patient and family. After consideration of risks, benefits and other options for treatment, the patient has consented to  Procedure(s): Coronary/Graft Acute MI Revascularization (N/A) LEFT HEART CATH AND CORONARY ANGIOGRAPHY (N/A) as a surgical intervention.  The patient's history has been reviewed, patient examined, no change in status, stable for surgery.  I have reviewed the patient's chart and labs.  Questions were answered to the patient's satisfaction.     Quay Burow

## 2020-07-22 NOTE — ED Provider Notes (Signed)
[name] Provider Note   CSN: [id number] Arrival date & time: 07/22/20  9629   History Chief complaint: Status post CPR  Antonio Navarro is a 59 y.o. male.  The history is provided by the EMS personnel. The history is limited by the condition of the patient (Patient unresponsive).  He has history of hypertension and was brought in by EMS following out of hospital cardiac arrest.  Per EMS, his wife noted agonal respirations and dialed 911 with response time of about 9 minutes.  EMS found patient apneic and pulseless with initial rhythm of ventricular fibrillation.  He was intubated with a King airway and IV and started and he was given a total of 6 mg of epinephrine, 450 mg amiodarone and he was shocked 6 times.  On the sixth shock, there was return of spontaneous circulation which has been present for the last 20 minutes of transport.  Total time of CPR approximately 30 minutes.  Past Medical History:  Diagnosis Date  . Hypertension   . Kidney stones     There are no problems to display for this patient.   No past surgical history on file.     Family History  Problem Relation Age of Onset  . Cancer Mother   . Hypertension Father     Social History   Tobacco Use  . Smoking status: Current Every Day Smoker    Packs/day: 1.00    Types: Cigarettes  . Smokeless tobacco: Never Used  Vaping Use  . Vaping Use: Never used  Substance Use Topics  . Alcohol use: Yes    Comment: occasional  . Drug use: Yes    Types: Marijuana    Comment: occ    Home Medications Prior to Admission medications   Medication Sig Start Date End Date Taking? Authorizing Provider  amLODipine (NORVASC) 10 MG tablet Take 1 tablet (10 mg total) by mouth daily. 05/15/20   Zigmund Gottron, NP  aspirin-acetaminophen-caffeine (EXCEDRIN MIGRAINE) [phone] MG per tablet Take 1 tablet by mouth every 6 (six) hours as needed (toothache).    [provider]   cyclobenzaprine (FLEXERIL) 5 MG tablet Take 1-2 tablets (5-10 mg total) by mouth at bedtime. 05/15/20   Zigmund Gottron, NP  gabapentin (NEURONTIN) 300 MG capsule Take 1 capsule (300 mg total) by mouth 3 (three) times daily. 05/22/20   Jaynee Eagles, PA-C  lidocaine (LIDODERM) 5 % Place 1 patch onto the skin daily. Remove & Discard patch within 12 hours or as directed by MD 06/02/20   Alfredia Client, PA-C  predniSONE (STERAPRED UNI-PAK 21 TAB) 10 MG (21) TBPK tablet Take by mouth daily. Per box instruction 05/15/20   Augusto Gamble B, NP  tiZANidine (ZANAFLEX) 4 MG tablet Take 1 tablet (4 mg total) by mouth every 8 (eight) hours as needed. 05/22/20   Jaynee Eagles, PA-C  traMADol (ULTRAM) 50 MG tablet Take 1 tablet (50 mg total) by mouth every 6 (six) hours as needed for severe pain. 05/22/20   Jaynee Eagles, PA-C    Allergies    Patient has no known allergies.  Review of Systems   Review of Systems  Unable to perform ROS: Patient unresponsive    Physical Exam Updated Vital Signs BP (!) 137/121   Pulse 63   Resp 15   Ht 6' (1.829 m)   Wt 102 kg   SpO2 100%   BMI 30.50 kg/m   Physical Exam Vitals and nursing note reviewed.  59 year old male, unresponsive with King airway in place. Vital signs are significant for elevated blood pressure. Oxygen saturation is 100%, which is normal. Head is normocephalic and atraumatic.  Pupils 3 mm and nonreactive.  Moderate amount of secretions present in the oral cavity. Neck is immobilized in a stiff cervical collar, which is removed. Lungs have symmetric air movement with some coarse rhonchi present. Chest moves symmetrically, no deformity. Heart has regular rate and rhythm without murmur. Abdomen is soft, flat. Extremities have no cyanosis or edema. Skin is warm and dry without rash. Neurologic: He is comatose, GCS equals 4.  There is minimal spontaneous movement of both arms, no movement of legs.  He does have spontaneous respirations.  ED Results  / Procedures / Treatments   Labs (all labs ordered are listed, but only abnormal results are displayed) Labs Reviewed  CBC WITH DIFFERENTIAL/PLATELET - Abnormal; Notable for the following components:      Result Value   WBC 16.6 (*)    MCV 103.2 (*)    All other components within normal limits  RESP PANEL BY RT-PCR (FLU A&B, COVID) [id number]  COMPREHENSIVE METABOLIC PANEL  LACTIC ACID, PLASMA  LACTIC ACID, PLASMA  TROPONIN I (HIGH SENSITIVITY)    EKG EKG Interpretation  Date/Time:  Saturday July 22 2020 04:13:06 EST Ventricular Rate:  62 PR Interval:    QRS Duration: 109 QT Interval:  429 QTC Calculation: 436 R Axis:   -48 Text Interpretation: Junctional rhythm Probable inferior infarct, recent Probable posterior infarct, recent Lateral leads are also involved ** ** ACUTE MI / STEMI ** ** No old tracing to compare Confirmed by Delora Fuel ([id number]) on 07/22/2020 4:34:14 AM   EKG Interpretation  Date/Time:  Saturday July 22 2020 05:07:19 EST Ventricular Rate:  61 PR Interval:    QRS Duration: 106 QT Interval:  461 QTC Calculation: 465 R Axis:   -8 Text Interpretation: Sinus rhythm Inferoposterior infarct, acute (RCA) Lateral infarct, acute Probable RV involvement, suggest recording right precordial leads >>> Acute MI <<< When compared with ECG of EARLIER SAME DATE ST elevation is more pronounced Confirmed by Delora Fuel ([id number]) on 07/22/2020 5:45:50 AM       Radiology DG Chest Portable 1 View  Result Date: 07/22/2020 CLINICAL DATA:  Intubation EXAM: PORTABLE CHEST 1 VIEW COMPARISON:  None. FINDINGS: Support Apparatus: --Endotracheal tube: Tip at the level of the clavicular heads. --Enteric tube:Tip and sideport project over the stomach. --Catheter(s):None --Other: None Linear opacities at the right lung base. IMPRESSION: 1. Radiographically appropriate position of endotracheal tube and nasogastric tube. 2. Right basilar atelectasis. Electronically Signed   By: Ulyses Jarred M.D.   On: 07/22/2020 04:46    Procedures Procedure Name: Intubation Date/Time: 07/22/2020 4:28 AM Performed by: Delora Fuel, MD Pre-anesthesia Checklist: Patient identified, Patient being monitored, Emergency Drugs available, Timeout performed and Suction available Oxygen Delivery Method: Non-rebreather mask Preoxygenation: Pre-oxygenation with 100% oxygen Induction Type: Rapid sequence Ventilation: Mask ventilation without difficulty Laryngoscope Size: Glidescope and 3 Grade View: Grade I Tube size: 7.5 mm Number of attempts: 1 Airway Equipment and Method: Rigid stylet and Video-laryngoscopy Placement Confirmation: ETT inserted through vocal cords under direct vision,  CO2 detector and Breath sounds checked- equal and bilateral Secured at: 24 cm Tube secured with: ETT holder Dental Injury: Teeth and Oropharynx as per pre-operative assessment     OG placement  Date/Time: 07/22/2020 4:49 AM Performed by: Delora Fuel, MD Authorized by: Delora Fuel, MD  Local anesthesia used: no  Anesthesia: Local anesthesia used: no  Sedation: Patient sedated: no  Patient tolerance: patient tolerated the procedure well with no immediate complications     CRITICAL CARE Performed by: Delora Fuel Total critical care time: 65 minutes Critical care time was exclusive of separately billable procedures and treating other patients. Critical care was necessary to treat or prevent imminent or life-threatening deterioration. Critical care was time spent personally by me on the following activities: development of treatment plan with patient and/or surrogate as well as nursing, discussions with consultants, evaluation of patient's response to treatment, examination of patient, obtaining history from patient or surrogate, ordering and performing treatments and interventions, ordering and review of laboratory studies, ordering and review of radiographic studies, pulse oximetry and re-evaluation  of patient's condition.  Medications Ordered in ED Medications  etomidate (AMIDATE) 2 MG/ML injection (has no administration in time range)  aspirin suppository 300 mg (300 mg Rectal Given 07/22/20 0424)  heparin 5000 UNIT/ML injection (4,000 Units  Given 07/22/20 0423)  succinylcholine (ANECTINE) injection 100 mg (100 mg Intravenous Given 07/22/20 0421)  propofol (DIPRIVAN) 1000 MG/100ML infusion (  New Bag/Given 07/22/20 0439)    ED Course  I have reviewed the triage vital signs and the nursing notes.  Pertinent labs & imaging results that were available during my care of the patient were reviewed by me and considered in my medical decision making (see chart for details).  MDM Rules/Calculators/A&P Patient with successful resuscitation from out of hospital cardiac arrest.  Edison Pace airway was removed and endotracheal tube placed.  His teeth were clenched requiring paralysis with succinylcholine.  Orogastric tube was placed.  Chest x-ray shows appropriate placement of endotracheal tube and orogastric tube, probable right basilar atelectasis.  ECG shows acute myocardial infarction involving inferior leads and probably anterolateral leads and posterior wall.  Code STEMI was activated.  He is given aspirin rectally and intravenous heparin.  I have discussed the case with Dr. Gwenlyn Found, on-call for STEMI.  He is reviewing the ECG.  Intensivist has also been consulted.  Patient would likely be a good candidate for therapeutic hypothermia.  Old records are reviewed, and he has no relevant past visits.  Prior ED visits have been for back pain.  No prior ECGs in our system.  Family has arrived.  Patient apparently had some chest pain through the day yesterday, relieved with antacids.  No prior history of chest pain.  He is a cigarette smoker but no other cardiac risk factors.  Blood pressure has been labile but no episodes of hypotension.  ECG is repeated and shows more dramatic ST elevation.  Patient seen with Dr.  Radford Pax of cardiology service.  Patient is taken to the catheterization lab.  Labs do show elevated troponin of 818, mildly elevated creatinine which is not significantly changed from prior.  Macrocytosis is noted without anemia, significance unclear.  Final Clinical Impression(s) / ED Diagnoses Final diagnoses:  Cardiac arrest ([place])  Acute ST elevation myocardial infarction (STEMI), unspecified artery ([facility])  Elevated blood pressure reading with diagnosis of hypertension  Renal insufficiency  Macrocytosis without anemia    Rx / DC Orders ED Discharge Orders    None       Delora Fuel, MD 17/49/44 [phone]

## 2020-07-22 NOTE — H&P (View-Only) (Signed)
Cardiology Consultation:   Patient ID: Antonio Navarro MRN: [id number]; DOB: December 16, 1960  Admit date: 07/22/2020 Date of Consult: 07/22/2020  Primary Care Provider: Patient, No Pcp Per [place] Cardiologist: [place] Electrophysiologist:  None    Patient Profile:   Antonio Navarro is a 59 y.o. male with a hx of HTN who is being seen today for the evaluation of post cardiac arrest with inferolateral STEMI at the request of Delora Fuel, MD.  History of Present Illness:   Antonio Navarro is a 59yo AAM with a hx of HTN and chronic back pain but no other hx.  Hx is obtained by his fiance this evening.  He was in his USOH until yesterday when he developed vague chest discomfort in the midsternal area but no associated sx of SOB, N/V or diaphoresis.  He took some TUMS.  Later in the evening around 7pm he went to [facility] with his fiance and got a pizza to eat and then felt chest discomfort again.  He watched TV that evening and went to bed around 10:30pm and around 3am his fiance noted what she thought was snoring and she nudged him and he moved and the snoring stopped and she went back to sleep.  About 5 minutes later he made the same noise and she nudged him again and tried to arouse him and he would not respond.  She noticed that his eyes were open but he would not respond. She called EMS but could not do CPR because he was too large for her to move off the bed.  EMS arrived about 10 minutes later and he was found to be in Vfib.  He received a total of 6 rounds of epi and defib x 6 and 1 round of Amio with ROSR.  Now intubated and sedated and hemodynamically stable.  He has a hx of tobacco abuse smoking 1/2ppd and fm hx of father having CHF but no fm hx of SCD.  According to his fiance he had been out working in the yard earlier that week with no problems and was quite active. His fiance says that he has been vaccinated for COVID 19.   Past Medical History:  Diagnosis Date  . Hypertension    . Kidney stones     No past surgical history on file.   Home Medications:  Prior to Admission medications   Medication Sig Start Date End Date Taking? Authorizing Provider  amLODipine (NORVASC) 10 MG tablet Take 1 tablet (10 mg total) by mouth daily. 05/15/20   Zigmund Gottron, NP  aspirin-acetaminophen-caffeine (EXCEDRIN MIGRAINE) [phone] MG per tablet Take 1 tablet by mouth every 6 (six) hours as needed (toothache).    [provider]  cyclobenzaprine (FLEXERIL) 5 MG tablet Take 1-2 tablets (5-10 mg total) by mouth at bedtime. 05/15/20   Zigmund Gottron, NP  gabapentin (NEURONTIN) 300 MG capsule Take 1 capsule (300 mg total) by mouth 3 (three) times daily. 05/22/20   Jaynee Eagles, PA-C  lidocaine (LIDODERM) 5 % Place 1 patch onto the skin daily. Remove & Discard patch within 12 hours or as directed by MD 06/02/20   Alfredia Client, PA-C  predniSONE (STERAPRED UNI-PAK 21 TAB) 10 MG (21) TBPK tablet Take by mouth daily. Per box instruction 05/15/20   Augusto Gamble B, NP  tiZANidine (ZANAFLEX) 4 MG tablet Take 1 tablet (4 mg total) by mouth every 8 (eight) hours as needed. 05/22/20   Jaynee Eagles, PA-C  traMADol (ULTRAM) 50 MG  tablet Take 1 tablet (50 mg total) by mouth every 6 (six) hours as needed for severe pain. 05/22/20   Jaynee Eagles, PA-C    Inpatient Medications: Scheduled Meds: . etomidate       Continuous Infusions:  PRN Meds:   Allergies:   No Known Allergies  Social History:   Social History   Socioeconomic History  . Marital status: Single    Spouse name: Not on file  . Number of children: Not on file  . Years of education: Not on file  . Highest education level: Not on file  Occupational History  . Not on file  Tobacco Use  . Smoking status: Current Every Day Smoker    Packs/day: 1.00    Types: Cigarettes  . Smokeless tobacco: Never Used  Vaping Use  . Vaping Use: Never used  Substance and Sexual Activity  . Alcohol use: Yes    Comment: occasional   . Drug use: Yes    Types: Marijuana    Comment: occ  . Sexual activity: Not on file  Other Topics Concern  . Not on file  Social History Narrative  . Not on file   Social Determinants of Health   Financial Resource Strain:   . Difficulty of Paying Living Expenses: Not on file  Food Insecurity:   . Worried About Charity fundraiser in the Last Year: Not on file  . Ran Out of Food in the Last Year: Not on file  Transportation Needs:   . Lack of Transportation (Medical): Not on file  . Lack of Transportation (Non-Medical): Not on file  Physical Activity:   . Days of Exercise per Week: Not on file  . Minutes of Exercise per Session: Not on file  Stress:   . Feeling of Stress : Not on file  Social Connections:   . Frequency of Communication with Friends and Family: Not on file  . Frequency of Social Gatherings with Friends and Family: Not on file  . Attends Religious Services: Not on file  . Active Member of Clubs or Organizations: Not on file  . Attends Archivist Meetings: Not on file  . Marital Status: Not on file  Intimate Partner Violence:   . Fear of Current or Ex-Partner: Not on file  . Emotionally Abused: Not on file  . Physically Abused: Not on file  . Sexually Abused: Not on file    Family History:    Family History  Problem Relation Age of Onset  . Cancer Mother   . Hypertension Father      ROS:  Please see the history of present illness.   All other ROS reviewed and negative.     Physical Exam/Data:   Vitals:   07/22/20 0450 07/22/20 0455 07/22/20 0500 07/22/20 0549  BP:   138/90   Pulse: 69 64 66   Resp: (!) 22 (!) 25 (!) 27   SpO2: 99% 99% 98% 100%  Weight:      Height:        Intake/Output Summary (Last 24 hours) at 07/22/2020 0555 Last data filed at 07/22/2020 0417 Gross per 24 hour  Intake 1000 ml  Output --  Net 1000 ml   Last 3 Weights 07/22/2020 06/02/2020 03/08/2019  Weight (lbs) 224 lb 13.9 oz 225 lb 219 lb  Weight (kg)  102 kg 102.059 kg 99.338 kg     Body mass index is 30.5 kg/m.  General:  Well nourished, well developed intubated and sedated HEENT:  normal Lymph: no adenopathy Neck: no JVD Endocrine:  No thryomegaly Vascular: No carotid bruits; FA pulses 2+ bilaterally without bruits  Cardiac:  normal S1, S2; RRR; no murmur  Lungs:  clear to auscultation bilaterally, no wheezing, rhonchi or rales  Abd: soft, nontender, no hepatomegaly  Ext: no edema Musculoskeletal:  No deformities Skin: warm and dry  Neuro:  Cannot assess Psych:  Cannot assess  EKG:  The EKG was personally reviewed and demonstrates:  NSR with inferolateral ST elevated and ST depression in V1-V2  Telemetry:  Telemetry was personally reviewed and demonstrates:  NSR  Relevant CV Studies: none  Laboratory Data:  High Sensitivity Troponin:   Recent Labs  Lab 07/22/20 0418  TROPONINIHS 818*     Chemistry Recent Labs  Lab 07/22/20 0418  NA 138  K 3.8  CL 104  CO2 15*  GLUCOSE 286*  BUN 12  CREATININE 1.65*  CALCIUM 8.6*  GFRNONAA 48*  ANIONGAP 19*    Recent Labs  Lab 07/22/20 0418  PROT 5.7*  ALBUMIN 3.0*  AST 112*  ALT 97*  ALKPHOS 78  BILITOT 0.8   Hematology Recent Labs  Lab 07/22/20 0418  WBC 16.6*  RBC 4.31  HGB 14.3  HCT 44.5  MCV 103.2*  MCH 33.2  MCHC 32.1  RDW 12.4  PLT 165   BNPNo results for input(s): BNP, PROBNP in the last 168 hours.  DDimer No results for input(s): DDIMER in the last 168 hours.   Radiology/Studies:  DG Chest Portable 1 View  Result Date: 07/22/2020 CLINICAL DATA:  Intubation EXAM: PORTABLE CHEST 1 VIEW COMPARISON:  None. FINDINGS: Support Apparatus: --Endotracheal tube: Tip at the level of the clavicular heads. --Enteric tube:Tip and sideport project over the stomach. --Catheter(s):None --Other: None Linear opacities at the right lung base. IMPRESSION: 1. Radiographically appropriate position of endotracheal tube and nasogastric tube. 2. Right basilar  atelectasis. Electronically Signed   By: Ulyses Jarred M.D.   On: 07/22/2020 04:46     Assessment and Plan:   1. Cardiac arrest -secondary to Acute inferolateral STEMI -no CPR for 10 minutes and then ROSR after 30 minutes and 6 rounds of epi/defib and Amio -currently intubated and sedated -hemodynamically stable -Vent management per CCM  2.  Acute inferolateral STEMI -had CP off and on yesterday and thought it was indigestion -no associated sx of N/V/diaphoresis -CRFs include HTN, obesity and ongoing tobacco use -EKG shows inferolateral ST elevation with reciprocal change in V1 and V2 -initial hsTrop 818 -discussed case with Dr. Gwenlyn Found and will take emergently to cath lab to define coronary anatomy -continue IV heparin gtt, ASA -start high dose statin and Lopressor 25mg  BID -check HbgA1C and FLP -2D echo to assess LVF  3.  HTN -BP elevated at 144/133mmhg -start Lopressor 25mg  BID -add ARB if BP tolerates BB and renal function improves  4. Tobacco abuse -needs smoking cessation counseling  5.  AKI -SCr 1.65 likely related to cardiac arrest -continue to monitor  6.  Elevated BS -no hx of DM -check HbgA1C -qac and hs BS with SS Insulin coverage per CCM    The patient is critically ill with multiple organ systems failure and requires high complexity decision making for assessment and support, frequent evaluation and titration of therapies, application of advanced monitoring technologies and extensive interpretation of multiple databases. Critical Care Time devoted to patient care services described in this note independent of APP time is 60 minutes with >50% of time spent in direct patient care.  TIMI Risk Score for ST  Elevation MI:   The patient's TIMI risk score is 1, which indicates a 1.6% risk of all cause mortality at 30 days.    For questions or updates, please contact [place] Please consult [email or web address] for contact info under    Signed, Fransico Him, MD  07/22/2020 5:55 AM

## 2020-07-22 NOTE — Procedures (Signed)
Patient Name: JAELON GATLEY  MRN: [id number]  Epilepsy Attending: Lora Havens  Referring Physician/Provider: Dr Ina Homes Date: 07/22/2020 Duration: 23.10 mins  Patient history: 59yo M s/p cardiac arrest. EEG to evaluate for seizure  Level of alertness: comatose  AEDs during EEG study: Propofol  Technical aspects: This EEG study was done with scalp electrodes positioned according to the 10-20 International system of electrode placement. Electrical activity was acquired at a sampling rate of 500Hz  and reviewed with a high frequency filter of 70Hz  and a low frequency filter of 1Hz . EEG data were recorded continuously and digitally stored.   Description: EEG showed continuous generalized background attenuation. EEG is not reactive to noxious stimulation. Hyperventilation and photic stimulation were not performed.     ABNORMALITY - Background attenuation, generalized  IMPRESSION: This study is suggestive of profound diffuse encephalopathy, nonspecific etiology but likely related to sedation, anoxic-hypoxic injury. No seizures or epileptiform discharges were seen throughout the recording.  Fisher Hargadon Barbra Sarks

## 2020-07-23 ENCOUNTER — Inpatient Hospital Stay (HOSPITAL_COMMUNITY): Payer: Medicaid Other

## 2020-07-23 ENCOUNTER — Other Ambulatory Visit: Payer: Self-pay

## 2020-07-23 DIAGNOSIS — I213 ST elevation (STEMI) myocardial infarction of unspecified site: Secondary | ICD-10-CM

## 2020-07-23 DIAGNOSIS — I469 Cardiac arrest, cause unspecified: Secondary | ICD-10-CM

## 2020-07-23 LAB — CBC
HCT: 43.6 % (ref 39.0–52.0)
Hemoglobin: 14.5 g/dL (ref 13.0–17.0)
MCH: 32.4 pg (ref 26.0–34.0)
MCHC: 33.3 g/dL (ref 30.0–36.0)
MCV: 97.5 fL (ref 80.0–100.0)
Platelets: 164 10*3/uL (ref 150–400)
RBC: 4.47 MIL/uL (ref 4.22–5.81)
RDW: 12.6 % (ref 11.5–15.5)
WBC: 14.2 10*3/uL — ABNORMAL HIGH (ref 4.0–10.5)
nRBC: 0 % (ref 0.0–0.2)

## 2020-07-23 LAB — BASIC METABOLIC PANEL
Anion gap: 11 (ref 5–15)
BUN: 19 mg/dL (ref 6–20)
CO2: 21 mmol/L — ABNORMAL LOW (ref 22–32)
Calcium: 8.6 mg/dL — ABNORMAL LOW (ref 8.9–10.3)
Chloride: 106 mmol/L (ref 98–111)
Creatinine, Ser: 1.49 mg/dL — ABNORMAL HIGH (ref 0.61–1.24)
GFR, Estimated: 54 mL/min — ABNORMAL LOW (ref >60)
Glucose, Bld: 101 mg/dL — ABNORMAL HIGH (ref 70–99)
Potassium: 3.7 mmol/L (ref 3.5–5.1)
Sodium: 138 mmol/L (ref 135–145)

## 2020-07-23 LAB — HEPATIC FUNCTION PANEL
ALT: 92 U/L — ABNORMAL HIGH (ref 0–44)
AST: 147 U/L — ABNORMAL HIGH (ref 15–41)
Albumin: 3.1 g/dL — ABNORMAL LOW (ref 3.5–5.0)
Alkaline Phosphatase: 54 U/L (ref 38–126)
Bilirubin, Direct: 0.3 mg/dL — ABNORMAL HIGH (ref 0.0–0.2)
Indirect Bilirubin: 0.8 mg/dL (ref 0.3–0.9)
Total Bilirubin: 1.1 mg/dL (ref 0.3–1.2)
Total Protein: 6.2 g/dL — ABNORMAL LOW (ref 6.5–8.1)

## 2020-07-23 LAB — PHOSPHORUS
Phosphorus: 2.4 mg/dL — ABNORMAL LOW (ref 2.5–4.6)
Phosphorus: 2.3 mg/dL — ABNORMAL LOW (ref 2.5–4.6)

## 2020-07-23 LAB — ECHOCARDIOGRAM COMPLETE
Area-P 1/2: 3.16 cm2
Calc EF: 56.4 %
Height: 72 in
S' Lateral: 4.1 cm
Single Plane A2C EF: 51.1 %
Single Plane A4C EF: 59.8 %
Weight: 3446.23 oz

## 2020-07-23 LAB — GLUCOSE, CAPILLARY
Glucose-Capillary: 101 mg/dL — ABNORMAL HIGH (ref 70–99)
Glucose-Capillary: 103 mg/dL — ABNORMAL HIGH (ref 70–99)
Glucose-Capillary: 115 mg/dL — ABNORMAL HIGH (ref 70–99)
Glucose-Capillary: 129 mg/dL — ABNORMAL HIGH (ref 70–99)
Glucose-Capillary: 89 mg/dL (ref 70–99)
Glucose-Capillary: 96 mg/dL (ref 70–99)

## 2020-07-23 LAB — MAGNESIUM
Magnesium: 1.5 mg/dL — ABNORMAL LOW (ref 1.7–2.4)
Magnesium: 2.7 mg/dL — ABNORMAL HIGH (ref 1.7–2.4)

## 2020-07-23 LAB — TRIGLYCERIDES: Triglycerides: 251 mg/dL — ABNORMAL HIGH (ref <150)

## 2020-07-23 IMAGING — DX DG CHEST 1V PORT
1 series · 2 of 2 positions shown · non-contrast
Comparison: [DATE]

CLINICAL DATA: Recent cardiac arrest

EXAM:
PORTABLE CHEST 1 VIEW

[Series 1: chest · 0.14mm/px · 2 of 2 slices shown]
[im 1/2]
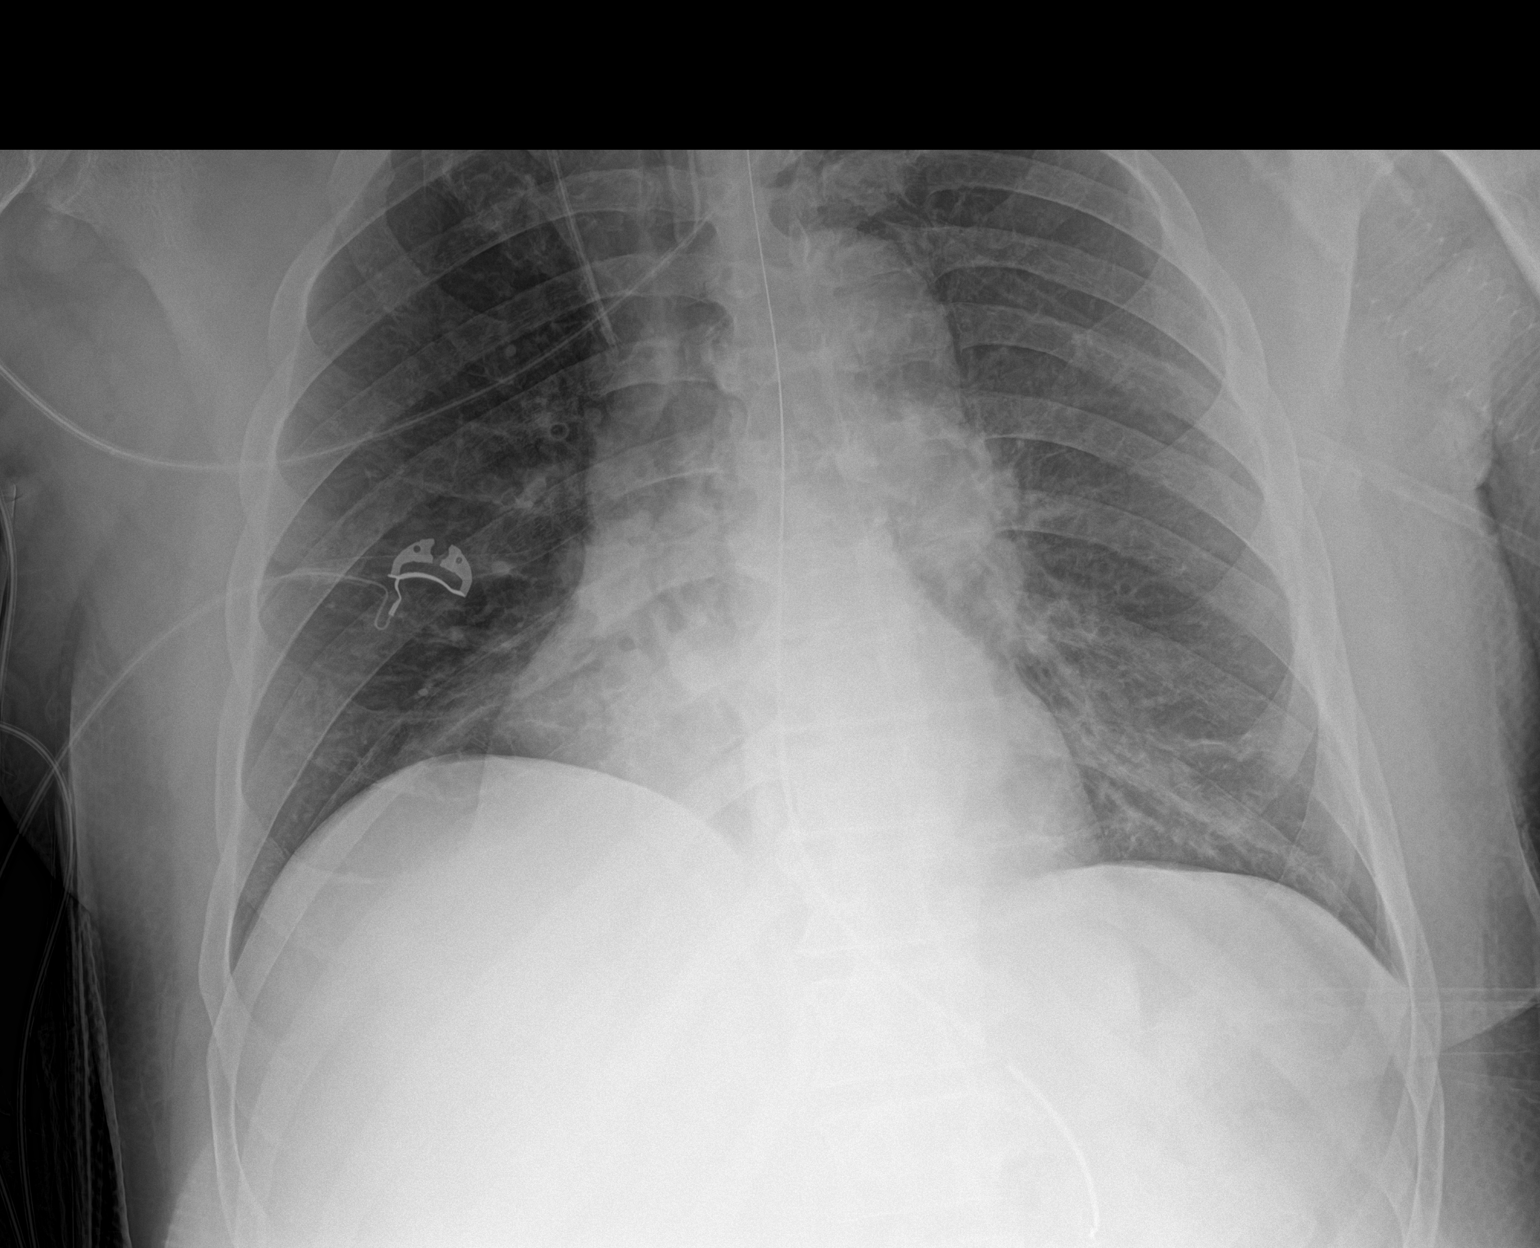
[im 2/2]
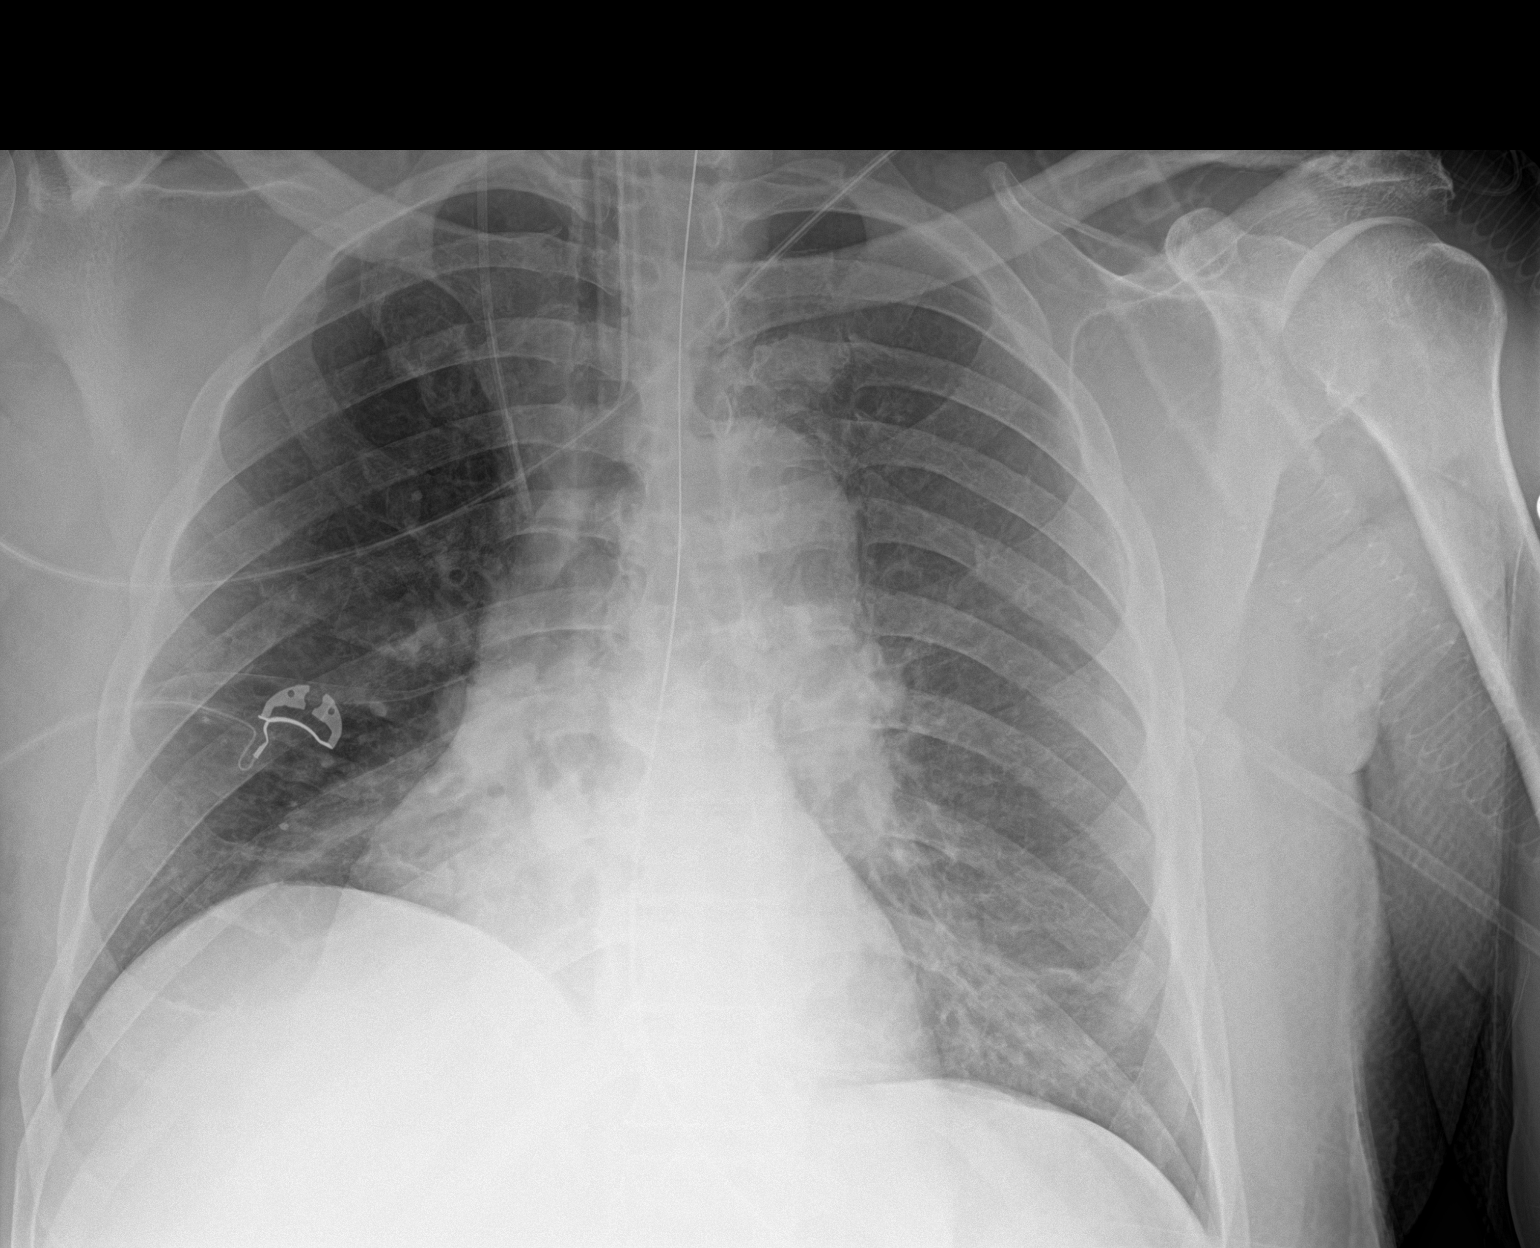

[2 of 2 positions shown; findings below may reference images not displayed]

FINDINGS: Endotracheal tube, gastric catheter and right jugular central line
are again seen and stable. No pneumothorax is noted. Lungs are clear
bilaterally. No bony abnormality is noted.
IMPRESSION: No acute abnormality noted.

## 2020-07-23 MED ORDER — VITAL HIGH PROTEIN PO LIQD
1000.0000 mL | ORAL | Status: DC
  Administered 2020-07-23: 1000 mL

## 2020-07-23 MED ORDER — MAGNESIUM SULFATE 4 GM/100ML IV SOLN
4.0000 g | Freq: Once | INTRAVENOUS | Status: AC
  Administered 2020-07-23: 4 g via INTRAVENOUS
  Filled 2020-07-23: qty 100

## 2020-07-23 MED ORDER — PERFLUTREN LIPID MICROSPHERE
1.0000 mL | INTRAVENOUS | Status: AC | PRN
  Administered 2020-07-23: 2 mL via INTRAVENOUS
  Filled 2020-07-23: qty 10

## 2020-07-23 MED ORDER — POTASSIUM CHLORIDE 20 MEQ PO PACK
40.0000 meq | PACK | Freq: Once | ORAL | Status: AC
  Administered 2020-07-23: 40 meq via ORAL
  Filled 2020-07-23: qty 2

## 2020-07-23 MED ORDER — PROSOURCE TF PO LIQD
45.0000 mL | Freq: Two times a day (BID) | ORAL | Status: DC
  Administered 2020-07-23 – 2020-07-24 (×3): 45 mL
  Filled 2020-07-23 (×3): qty 45

## 2020-07-23 NOTE — Progress Notes (Signed)
LTM maint complete - no skin breakdown under:  no skin break down under Fp1 F3 F8 A2 Event button push- Acknowledged by [facility]. [facility] monitored.

## 2020-07-23 NOTE — Progress Notes (Signed)
Patient Name: Antonio Navarro   Patient Profile:   Antonio Navarro is a 59 y.o. male with a hx of HTN who is being seen today for the evaluation of post cardiac arrest with inferolateral STEMI--cx by VDRF>> intubation and >> hypoxic encephalopathy >> cooled  12/4 Cath >>Cx 95>>stent  On cangrelor 2/2 vomiting of ticagrelor  Echo  pending    SUBJECTIVE unresponsive   Past Medical History:  Diagnosis Date  . Hypertension   . Kidney stones     Scheduled Meds:  Scheduled Meds: . aspirin  81 mg Per Tube Daily  . atorvastatin  80 mg Per Tube Daily  . chlorhexidine gluconate (MEDLINE KIT)  15 mL Mouth Rinse BID  . Chlorhexidine Gluconate Cloth  6 each Topical Daily  . fentaNYL (SUBLIMAZE) injection  50 mcg Intravenous Once  . heparin  5,000 Units Subcutaneous Q8H  . insulin aspart  0-24 Units Subcutaneous Q4H  . mouth rinse  15 mL Mouth Rinse 10 times per day  . metoCLOPramide (REGLAN) injection  10 mg Intravenous Q6H  . metoprolol tartrate  5 mg Intravenous Q6H  . pantoprazole (PROTONIX) IV  40 mg Intravenous QHS  . sodium chloride flush  10-40 mL Intracatheter Q12H  . sodium chloride flush  3 mL Intravenous Q12H  . ticagrelor  90 mg Per Tube BID   Continuous Infusions: . sodium chloride    . sodium chloride 10 mL/hr at 07/23/20 0800  . sodium chloride Stopped (07/22/20 0955)  . cefTRIAXone (ROCEPHIN)  IV Stopped (07/22/20 1413)  . fentaNYL infusion INTRAVENOUS 100 mcg/hr (07/23/20 0818)  . midazolam 4 mg/hr (07/23/20 0800)  . norepinephrine (LEVOPHED) Adult infusion Stopped (07/22/20 1023)  . propofol (DIPRIVAN) infusion 50 mcg/kg/min (07/23/20 0800)   sodium chloride, acetaminophen, fentaNYL, midazolam, ondansetron (ZOFRAN) IV, perflutren lipid microspheres (DEFINITY) IV suspension, polyethylene glycol, sodium chloride flush, sodium chloride flush    PHYSICAL EXAM Vitals:   07/23/20 0700 07/23/20 0800 07/23/20 0802 07/23/20 0818  BP: 110/82 109/84    Pulse:   69    Resp: (!) 30 (!) 30    Temp: (!) 96.4 F (35.8 C)   (!) 95.9 F (35.5 C)  TempSrc:      SpO2:   100%   Weight:      Height:       Well developed and nourished intubated and nonresponsive Neck   Clear good air movement  Regular rate and rhythm  Abd-soft with active BS No Clubbing cyanosis edema Skin-warm and dry grossly normal sensory and motor function     TELEMETRY: Reviewed personnally with sinus and some PVCs       Intake/Output Summary (Last 24 hours) at 07/23/2020 0915 Last data filed at 07/23/2020 0800 Gross per 24 hour  Intake 1912.74 ml  Output 1345 ml  Net 567.74 ml    LABS: Basic Metabolic Panel: Recent Labs  Lab 07/22/20 0418 07/22/20 0418 07/22/20 0933 07/22/20 1220 07/22/20 1220 07/22/20 2047 07/23/20 0434  NA 138  --  141 142  --  140 138  K 3.8  --  4.0 4.4  --  4.1 3.7  CL 104  --   --  110  --  106 106  CO2 15*  --   --  21*  --  21* 21*  GLUCOSE 286*  --   --  149*  --  119* 101*  BUN 12  --   --  18  --  17  19  CREATININE 1.65*  --   --  1.62*  --  1.48* 1.49*  CALCIUM 8.6*   < >  --  8.5*   < > 8.7* 8.6*  MG  --   --   --   --   --   --  1.5*  PHOS  --   --   --   --   --   --  2.4*   < > = values in this interval not displayed.   Cardiac Enzymes: No results for input(s): CKTOTAL, CKMB, CKMBINDEX, TROPONINI in the last 72 hours. CBC: Recent Labs  Lab 07/22/20 0418 07/22/20 0933 07/23/20 0434  WBC 16.6*  --  14.2*  NEUTROABS 7.3  --   --   HGB 14.3 15.0 14.5  HCT 44.5 44.0 43.6  MCV 103.2*  --  97.5  PLT 165  --  164   PROTIME: No results for input(s): LABPROT, INR in the last 72 hours. Liver Function Tests: Recent Labs    07/22/20 0418 07/23/20 0434  AST 112* 147*  ALT 97* 92*  ALKPHOS 78 54  BILITOT 0.8 1.1  PROT 5.7* 6.2*  ALBUMIN 3.0* 3.1*   No results for input(s): LIPASE, AMYLASE in the last 72 hours. BNP: BNP (last 3 results) No results for input(s): BNP in the last 8760 hours.  ProBNP (last 3  results) No results for input(s): PROBNP in the last 8760 hours.  D-Dimer: No results for input(s): DDIMER in the last 72 hours. Hemoglobin A1C: No results for input(s): HGBA1C in the last 72 hours. Fasting Lipid Panel: Recent Labs    07/23/20 0434  TRIG 251*   Thyroid Function Tests: No results for input(s): TSH, T4TOTAL, T3FREE, THYROIDAB in the last 72 hours.  Invalid input(s): FREET3 Anemia Panel: No results for input(s): VITAMINB12, FOLATE, FERRITIN, TIBC, IRON, RETICCTPCT in the last 72 hours.       ASSESSMENT AND PLAN:  Active Problems:   Acute ST elevation myocardial infarction (STEMI) [facility])   Cardiac arrest ([place])   Primary hypertension   Shock circulatory (HCC)  Hemodynamically stable off pressors  Rhythm -- occ ectopics/couplets follow  Echo pending      Signed, Virl Axe MD  07/23/2020

## 2020-07-23 NOTE — Progress Notes (Signed)
Brief Nutrition Note RD working remotely.   Consult received for enteral/tube feeding initiation and management. OGT placed overnight; no abdominal xray report and no mention of OGT in CXR report.   Adult Enteral Nutrition Protocol initiated. Full assessment to follow.  Admitting Dx: Cardiac arrest ([place]) [I46.9] Acute ST elevation myocardial infarction (STEMI), unspecified artery (HCC) [I21.3] Elevated blood pressure reading with diagnosis of hypertension [I10]  Body mass index is 29.21 kg/m. Pt meets criteria for overweight status based on current BMI.  Labs:  Recent Labs  Lab 07/22/20 1220 07/22/20 2047 07/23/20 0434  NA 142 140 138  K 4.4 4.1 3.7  CL 110 106 106  CO2 21* 21* 21*  BUN 18 17 19   CREATININE 1.62* 1.48* 1.49*  CALCIUM 8.5* 8.7* 8.6*  MG  --   --  1.5*  PHOS  --   --  2.4*  GLUCOSE 149* 119* 101*         Jarome Matin, MS, RD, LDN, CNSC Inpatient Clinical Dietitian RD pager # available in [facility]  After hours/weekend pager # available in [facility]

## 2020-07-23 NOTE — Progress Notes (Signed)
  Echocardiogram 2D Echocardiogram has been performed.  Antonio Navarro 07/23/2020, 9:03 AM

## 2020-07-23 NOTE — Procedures (Addendum)
Patient Name: Antonio Navarro  MRN: [id number]  Epilepsy Attending: Lora Havens  Referring Physician/Provider: Dr Ina Homes Duration: 07/22/2020 1043 to 12/5/202 1043  Patient history: 59yo M s/p cardiac arrest. EEG to evaluate for seizure  Level of alertness: comatose  AEDs during EEG study: Propofol  Technical aspects: This EEG study was done with scalp electrodes positioned according to the 10-20 International system of electrode placement. Electrical activity was acquired at a sampling rate of 500Hz  and reviewed with a high frequency filter of 70Hz  and a low frequency filter of 1Hz . EEG data were recorded continuously and digitally stored.   Description: EEG initially showed generalized attenuation which gradually evolved to burst attenuation with bursts of generalized 4-5hz  theta slowing lasting 1-2 seconds and attenuation for 5-7 seconds. Gradually the periods of attenuation became shorter and eeg appeared discontinuous.  Event button was pressed on 07/22/2020 at 1259, 1319, 1321 and 1401 during which patient was noted to be trembling/shivering. Concomitant eeg before, during and after the event didn't show any eeg change to suggest seizure  ABNORMALITY - Burst attenuation, generalized  IMPRESSION: This study is suggestive of profound diffuse encephalopathy, nonspecific etiology but likely related to sedation, anoxic-hypoxic injury. No seizures or epileptiform discharges were seen throughout the recording.  Event button was pressed on 07/22/2020 at 1259, 1319, 1321 and 1401 during which patient was noted to be trembling/shivering without concomitant eeg change and were NOT epileptic.  EEG appears to be improving compared to routine eeg.  Nataley Bahri Barbra Sarks

## 2020-07-23 NOTE — Progress Notes (Signed)
NAME:  Antonio Navarro, MRN:  [id number], DOB:  1961-02-26, LOS: 1 ADMISSION DATE:  07/22/2020, CONSULTATION DATE: 07/22/2020 REFERRING MD: Dr. Roxanne Navarro, CHIEF COMPLAINT: Status post arrest  Brief History   Patient is 59 year old male status post arrest  History of present illness   Patient is a 59 year old male brought to the emergency room after out-of-hospital cardiac arrest.  His wife apparently noticed that while he was sleeping he had agonal breathing and called 911.  He was without resuscitative effort for about 9 minutes.  Patient was found to be apneic and pulls list with initial rhythm of ventricular fibrillation.  He was intubated with [facility] airway and received 6 cardioversion attempts.  He was loaded with amiodarone and got a total of 6 rounds of 1 mg epinephrine.  He had return of spontaneous circulation.  ER physician estimates that time of CPR efforts were 30 minutes.  King airway was replaced in the emergency room. At time of dictation patient is being taken to the cardiac Cath Lab.  On return of sinus rhythm the patient was found to have ST changes in the posterior and inferior leads.  Laboratory show an anion gap of 19 bicarb of 15 lactic acid is pending, glucose is 286, creatinine is 1.65 GFR estimated 45.  Past Medical History   . Hypertension   . Kidney stones      Significant Hospital Events   As above  Consults:  Cardiology, PCCM  Procedures:  As above  Significant Diagnostic Tests:  NA, of note at time of dictation CMP is pending.  Micro Data:  NA  Antimicrobials:  NA  Interim history/subjective:  No events, remains heavily sedated on vent.  VEEG ongoing.  Objective   Blood pressure 109/78, pulse 73, temperature (!) 95.9 F (35.5 C), resp. rate (!) 0, height 6' (1.829 m), weight 97.7 kg, SpO2 99 %.    Vent Mode: PRVC FiO2 (%):  [40 %-70 %] 40 % Set Rate:  [30 bmp] 30 bmp Vt Set:  [423 mL] 620 mL PEEP:  [5 cmH20] 5 cmH20 Plateau Pressure:  [22  cmH20-25 cmH20] 22 cmH20   Intake/Output Summary (Last 24 hours) at 07/23/2020 1217 Last data filed at 07/23/2020 1000 Gross per 24 hour  Intake 1936.92 ml  Output 1365 ml  Net 571.92 ml   Filed Weights   07/22/20 0426 07/23/20 0500  Weight: 102 kg 97.7 kg    Examination: Constitutional: heavily sedated man on vent Eyes: pupils pinpoint but reactive, has oculocephalic and corneal reflexes Ears, nose, mouth, and throat: ETT with minimal secretions Cardiovascular: RRR, ext warm Respiratory: Scattered rhonci, passive on vent Gastrointestinal: Soft, hy[oactive BS Skin: No rashes, normal turgor Neurologic: GCS3 with current level of sedation Psychiatric: cannot assess  Renal function stable Mag/ K low repleting Mild transaminitis stable   Resolved Hospital Problem list   NA  Assessment & Plan:  OOH Vfib arrest secondary to STEMI s/p PCI to cx 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 Acute hypoxemic respiratory failure secondary to post arrest encephalopathy and aspiration pneumonitis - Antiplatelet agents per cardiology - Continue TTM, start weaning sedation tomorrow - Ceftriaxone x 5 days - Continue VEEG, appreciate neuro help - SSI - Updated family  Best practice (evaluated daily)   Diet: Start TF Pain/Anxiety/Delirium protocol (if indicated): Fentanyl VAP protocol (if indicated): Yes DVT prophylaxis: Yes GI prophylaxis: Yes Glucose control: Sliding scale insulin Mobility: Bedrest Code Status: Full Disposition: ICU  Patient critically ill due to cardiac arrest, respiratory failure  Interventions to address this today mechanical ventilation titration Risk of deterioration without these interventions is high  I personally spent 34 minutes providing critical care not including any separately billable procedures  Erskine Emery MD [place] [facility] 07/23/2020 12:23 PM Personal pager: [phone] If unanswered, please page CCM On-call:  [phone]

## 2020-07-24 ENCOUNTER — Encounter (HOSPITAL_COMMUNITY): Payer: Self-pay | Admitting: Cardiovascular Disease

## 2020-07-24 LAB — CBC
HCT: 37 % — ABNORMAL LOW (ref 39.0–52.0)
Hemoglobin: 12.6 g/dL — ABNORMAL LOW (ref 13.0–17.0)
MCH: 33.2 pg (ref 26.0–34.0)
MCHC: 34.1 g/dL (ref 30.0–36.0)
MCV: 97.4 fL (ref 80.0–100.0)
Platelets: 132 10*3/uL — ABNORMAL LOW (ref 150–400)
RBC: 3.8 MIL/uL — ABNORMAL LOW (ref 4.22–5.81)
RDW: 13.2 % (ref 11.5–15.5)
WBC: 11.6 10*3/uL — ABNORMAL HIGH (ref 4.0–10.5)
nRBC: 0 % (ref 0.0–0.2)

## 2020-07-24 LAB — PHOSPHORUS
Phosphorus: 1.6 mg/dL — ABNORMAL LOW (ref 2.5–4.6)
Phosphorus: 4.6 mg/dL (ref 2.5–4.6)
Phosphorus: 2.6 mg/dL (ref 2.5–4.6)

## 2020-07-24 LAB — HEPATIC FUNCTION PANEL
ALT: 64 U/L — ABNORMAL HIGH (ref 0–44)
AST: 93 U/L — ABNORMAL HIGH (ref 15–41)
Albumin: 2.5 g/dL — ABNORMAL LOW (ref 3.5–5.0)
Alkaline Phosphatase: 53 U/L (ref 38–126)
Bilirubin, Direct: 0.3 mg/dL — ABNORMAL HIGH (ref 0.0–0.2)
Indirect Bilirubin: 1 mg/dL — ABNORMAL HIGH (ref 0.3–0.9)
Total Bilirubin: 1.3 mg/dL — ABNORMAL HIGH (ref 0.3–1.2)
Total Protein: 5.5 g/dL — ABNORMAL LOW (ref 6.5–8.1)

## 2020-07-24 LAB — MAGNESIUM
Magnesium: 2.5 mg/dL — ABNORMAL HIGH (ref 1.7–2.4)
Magnesium: 2.3 mg/dL (ref 1.7–2.4)

## 2020-07-24 LAB — GLUCOSE, CAPILLARY
Glucose-Capillary: 111 mg/dL — ABNORMAL HIGH (ref 70–99)
Glucose-Capillary: 124 mg/dL — ABNORMAL HIGH (ref 70–99)
Glucose-Capillary: 135 mg/dL — ABNORMAL HIGH (ref 70–99)
Glucose-Capillary: 135 mg/dL — ABNORMAL HIGH (ref 70–99)
Glucose-Capillary: 137 mg/dL — ABNORMAL HIGH (ref 70–99)
Glucose-Capillary: 95 mg/dL (ref 70–99)

## 2020-07-24 LAB — POTASSIUM: Potassium: 4.2 mmol/L (ref 3.5–5.1)

## 2020-07-24 LAB — BASIC METABOLIC PANEL
Anion gap: 10 (ref 5–15)
BUN: 27 mg/dL — ABNORMAL HIGH (ref 6–20)
CO2: 20 mmol/L — ABNORMAL LOW (ref 22–32)
Calcium: 8.1 mg/dL — ABNORMAL LOW (ref 8.9–10.3)
Chloride: 108 mmol/L (ref 98–111)
Creatinine, Ser: 1.74 mg/dL — ABNORMAL HIGH (ref 0.61–1.24)
GFR, Estimated: 45 mL/min — ABNORMAL LOW (ref >60)
Glucose, Bld: 140 mg/dL — ABNORMAL HIGH (ref 70–99)
Potassium: 3.2 mmol/L — ABNORMAL LOW (ref 3.5–5.1)
Sodium: 138 mmol/L (ref 135–145)

## 2020-07-24 LAB — POCT ACTIVATED CLOTTING TIME: Activated Clotting Time: 279 seconds

## 2020-07-24 LAB — PATHOLOGIST SMEAR REVIEW

## 2020-07-24 MED ORDER — VITAL HIGH PROTEIN PO LIQD
1000.0000 mL | ORAL | Status: DC
  Administered 2020-07-25 – 2020-07-26 (×3): 1000 mL

## 2020-07-24 MED ORDER — CLEVIDIPINE BUTYRATE 0.5 MG/ML IV EMUL
0.0000 mg/h | INTRAVENOUS | Status: DC
  Administered 2020-07-24 – 2020-07-26 (×4): 1 mg/h via INTRAVENOUS
  Administered 2020-07-27 (×2): 3 mg/h via INTRAVENOUS
  Administered 2020-07-28: 2 mg/h via INTRAVENOUS
  Filled 2020-07-24 (×10): qty 50

## 2020-07-24 MED ORDER — PANTOPRAZOLE SODIUM 40 MG PO PACK
40.0000 mg | PACK | Freq: Every day | ORAL | Status: DC
  Administered 2020-07-24 – 2020-07-30 (×7): 40 mg
  Filled 2020-07-24 (×7): qty 20

## 2020-07-24 MED ORDER — POTASSIUM PHOSPHATES 15 MMOLE/5ML IV SOLN
45.0000 mmol | Freq: Once | INTRAVENOUS | Status: AC
  Administered 2020-07-24: 45 mmol via INTRAVENOUS
  Filled 2020-07-24: qty 15

## 2020-07-24 MED ORDER — LACTATED RINGERS IV BOLUS
1000.0000 mL | Freq: Once | INTRAVENOUS | Status: AC
  Administered 2020-07-24: 200 mL via INTRAVENOUS

## 2020-07-24 MED ORDER — DEXMEDETOMIDINE HCL IN NACL 400 MCG/100ML IV SOLN
0.4000 ug/kg/h | INTRAVENOUS | Status: DC
  Administered 2020-07-24: 0.4 ug/kg/h via INTRAVENOUS
  Administered 2020-07-24 – 2020-07-25 (×4): 1.2 ug/kg/h via INTRAVENOUS
  Administered 2020-07-25: 0.8 ug/kg/h via INTRAVENOUS
  Administered 2020-07-25 – 2020-07-26 (×4): 1 ug/kg/h via INTRAVENOUS
  Filled 2020-07-24: qty 200
  Filled 2020-07-24 (×5): qty 100
  Filled 2020-07-24: qty 200
  Filled 2020-07-24: qty 100

## 2020-07-24 NOTE — Procedures (Addendum)
Patient Name:Antonio Navarro CMK:[id number] Epilepsy Attending:Bernhard Koskinen Barbra Sarks Referring Physician/Provider:Dr Ina Homes Duration:07/23/2020 1043 to 12/6/202 5697  Patient history:59yo M s/p cardiac arrest. EEG to evaluate for seizure  Level of alertness:comatose  AEDs during EEG study:Propofol, versed  Technical aspects: This EEG study was done with scalp electrodes positioned according to the 10-20 International system of electrode placement. Electrical activity was acquired at a sampling rate of 500Hz  and reviewed with a high frequency filter of 70Hz  and a low frequency filter of 1Hz . EEG data were recorded continuously and digitally stored.   Description: EEG showed continuous generalized 5-6hz  theta slowing admixed with intermittent generalized 2-3Hz  delta slowing.   ABNORMALITY -Continuous slow, generalized  IMPRESSION: This study issuggestive ofseverediffuse encephalopathy, nonspecific etiology but likely related to sedation, anoxic-hypoxic injury.No seizures or epileptiform discharges were seen throughout the recording.  EEG appears to be improving compared to routine eeg.  Tovia Kisner Barbra Sarks

## 2020-07-24 NOTE — Progress Notes (Signed)
Initial Nutrition Assessment  DOCUMENTATION CODES:   Not applicable  INTERVENTION:   -D/c Prosource TF   Increase Vital High Protein to 65 ml/hr via OGT (1560 ml/day)  Tube feeding regimen provides 1560 kcal (65% of needs), 137 grams of protein, and 1306 ml of H2O.   TF + propofol provides 2368 kcals, which meets 98% of estimated kcal needs  NUTRITION DIAGNOSIS:   Inadequate oral intake related to inability to eat as evidenced by NPO status.  GOAL:   Patient will meet greater than or equal to 90% of their needs  MONITOR:   Vent status, Labs, Weight trends, TF tolerance, Skin, I & O's  REASON FOR ASSESSMENT:   Consult, Ventilator Enteral/tube feeding initiation and management  ASSESSMENT:   Patient is a 59 year old male brought to the emergency room after out-of-hospital cardiac arrest.  Pt admitted with out of hospital cardiac arrest.   12/4- s/p lt heart cath and coronary angiography, rt femoral sheath removed, OGT placed- placement verified by x-ray and pH testing 12/5- TF initiated by OGT  Patient is currently intubated on ventilator support MV: 19.8 L/min Temp (24hrs), Avg:98.2 F (36.8 C), Min:96.3 F (35.7 C), Max:99.3 F (37.4 C)  Propofol: 30.6 ml/hr (provides 808 kcals/ day)  Reviewed I/O's: +922 ml x 24 hours and +2.6 L since admission  UOP: 1.1 L x 24 hours  Case discussed with RN, who confirms TF started yesterday and pt tolerating well. TF currently infusing via OGT: Vital High Protein @ 40 ml/hr with 45 ml Prosource TF BID. Regimen providing 1040 kcals, 106 grams protein, and 803 ml free water daily, meeting 43% of estimated kcal needs and 84% of protein needs (76% of estimated energy needs with propofol).   Per RN, plan to wean sedation today. Pt has been cooled.   Pt on continuous EEG; per neurology notes, suggestive ofseverediffuse encephalopathy, nonspecific etiology but likely related to sedation, anoxic-hypoxic injury. Noted improvement  compared to routine EEG.   Reviewed wt hx; pt has experienced a 4.7% wt loss over the past 2 months, which is not significant for time frame.   Medications reviewed reglan and versed.   Labs reviewed: K: 3.2, Phos: 1.6, Mg: 2.5, CBGS: 95-137 (inpatient orders for glycemic control are 0-24 units insulin aspart every 4 hours).   NUTRITION - FOCUSED PHYSICAL EXAM:    Most Recent Value  Orbital Region No depletion  Upper Arm Region No depletion  Thoracic and Lumbar Region Unable to assess  Buccal Region No depletion  Temple Region No depletion  Clavicle Bone Region No depletion  Clavicle and Acromion Bone Region No depletion  Scapular Bone Region No depletion  Dorsal Hand No depletion  Patellar Region No depletion  Anterior Thigh Region Unable to assess  Posterior Calf Region No depletion  Edema (RD Assessment) None  Hair Reviewed  Eyes Reviewed  Mouth Reviewed  Skin Reviewed  Nails Reviewed       Diet Order:   Diet Order            Diet NPO time specified  Diet effective now                 EDUCATION NEEDS:   Not appropriate for education at this time  Skin:  Skin Assessment: Reviewed RN Assessment  Last BM:  07/21/20  Height:   Ht Readings from Last 1 Encounters:  07/22/20 6' (1.829 m)    Weight:   Wt Readings from Last 1 Encounters:  07/24/20 97.3 kg  Ideal Body Weight:  80.9 kg  BMI:  Body mass index is 29.09 kg/m.  Estimated Nutritional Needs:   Kcal:  2409  Protein:  125-150 grams  Fluid:  > 2 L    Loistine Chance, RD, LDN, [place] Registered Dietitian II Certified Diabetes Care and Education Specialist Please refer to [facility] for RD and/or RD on-call/weekend/after hours pager

## 2020-07-24 NOTE — Progress Notes (Signed)
NAME:  CRISTHIAN VANHOOK, MRN:  [id number], DOB:  Jul 05, 1961, LOS: 2 ADMISSION DATE:  07/22/2020, CONSULTATION DATE: 07/22/2020 REFERRING MD: Dr. Roxanne Mins, CHIEF COMPLAINT: Status post arrest  Brief History   Patient is 59 year old male status post arrest  History of present illness   Patient is a 59 year old male brought to the emergency room after out-of-hospital cardiac arrest.  His wife apparently noticed that while he was sleeping he had agonal breathing and called 911.  He was without resuscitative effort for about 9 minutes.  Patient was found to be apneic and pulls list with initial rhythm of ventricular fibrillation.  He was intubated with [facility] airway and received 6 cardioversion attempts.  He was loaded with amiodarone and got a total of 6 rounds of 1 mg epinephrine.  He had return of spontaneous circulation.  ER physician estimates that time of CPR efforts were 30 minutes.  King airway was replaced in the emergency room. At time of dictation patient is being taken to the cardiac Cath Lab.  On return of sinus rhythm the patient was found to have ST changes in the posterior and inferior leads.  Laboratory show an anion gap of 19 bicarb of 15 lactic acid is pending, glucose is 286, creatinine is 1.65 GFR estimated 45.  Past Medical History   . Hypertension   . Kidney stones      Significant Hospital Events   As above  Consults:  Cardiology, PCCM  Procedures:  As above  Significant Diagnostic Tests:  NA, of note at time of dictation CMP is pending.  Micro Data:  NA  Antimicrobials:  NA  Interim history/subjective:  No events, remains heavily sedated on vent.  VEEG ongoing.  Completed TTM.  Objective   Blood pressure 118/77, pulse 86, temperature 99 F (37.2 C), resp. rate (!) 30, height 6' (1.829 m), weight 97.3 kg, SpO2 100 %.    Vent Mode: PRVC FiO2 (%):  [40 %] 40 % Set Rate:  [30 bmp] 30 bmp Vt Set:  [409 mL] 620 mL PEEP:  [5 cmH20] 5 cmH20 Plateau Pressure:  [24  cmH20-26 cmH20] 25 cmH20   Intake/Output Summary (Last 24 hours) at 07/24/2020 0825 Last data filed at 07/24/2020 0800 Gross per 24 hour  Intake 2133.37 ml  Output 1060 ml  Net 1073.37 ml   Filed Weights   07/22/20 0426 07/23/20 0500 07/24/20 0500  Weight: 102 kg 97.7 kg 97.3 kg    Examination: Constitutional: ill appearing man on vent  Eyes: pupils pinpoint, sluggish to light, has minimal oculocephalic reflex Ears, nose, mouth, and throat: ETT in place, minimal secretions, Trachea midline Cardiovascular: RRR, +SEM, ext warm Respiratory: scattered rhonci, no wheezing Gastrointestinal: soft, +hypoactive BS Skin: No rashes, normal turgor Neurologic: GCS3 on current sedation, no cough/gag Psychiatric: RASS 0   Renal function worsening Phos/K low repleting Mild transaminitis improved   Resolved Hospital Problem list   NA  Assessment & Plan:  OOH Vfib arrest secondary to STEMI s/p PCI to cx 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 Acute hypoxemic respiratory failure secondary to post arrest encephalopathy and aspiration pneumonitis AKI- will challenge with some IVF - Antiplatelet agents per cardiology - Start weaning sedation today - Ceftriaxone x 5 days as ordered - Continue VEEG, appreciate neuro help - SSI - Updated family  Best practice (evaluated daily)   Diet: continue TF Pain/Anxiety/Delirium protocol (if indicated): weaning VAP protocol (if indicated): Yes DVT prophylaxis: heparin GI prophylaxis: Yes Glucose control: Sliding scale insulin Mobility: Bedrest  Code Status: Full Disposition: ICU  Patient critically ill due to cardiac arrest, respiratory failure Interventions to address this today mechanical ventilation titration, sedation titration, vEEG monitoring Risk of deterioration without these interventions is high  I personally spent 32 minutes providing critical care not including any separately billable procedures  Erskine Emery MD [place]  Pulmonary Critical Care 07/24/2020 8:25 AM Personal pager: [phone] If unanswered, please page CCM On-call: [phone]

## 2020-07-24 NOTE — Progress Notes (Signed)
LTM EEG discontinued - possible skin breakdown at [facility] location F4

## 2020-07-24 NOTE — Progress Notes (Signed)
AM K+ 3.2 and Phos 1.6. Creat 1.74 with GFR 45. CCM E-Link Electrolyte protocol initiated.

## 2020-07-24 NOTE — Progress Notes (Signed)
Progress Note  Patient Name: Antonio Navarro Date of Encounter: 07/24/2020  [facility] Cardiologist: Fransico Him, MD   Subjective   Pt intubated and sedated.   Inpatient Medications    Scheduled Meds: . aspirin  81 mg Per Tube Daily  . atorvastatin  80 mg Per Tube Daily  . chlorhexidine gluconate (MEDLINE KIT)  15 mL Mouth Rinse BID  . Chlorhexidine Gluconate Cloth  6 each Topical Daily  . feeding supplement (PROSource TF)  45 mL Per Tube BID  . feeding supplement (VITAL HIGH PROTEIN)  1,000 mL Per Tube Q24H  . fentaNYL (SUBLIMAZE) injection  50 mcg Intravenous Once  . heparin  5,000 Units Subcutaneous Q8H  . insulin aspart  0-24 Units Subcutaneous Q4H  . mouth rinse  15 mL Mouth Rinse 10 times per day  . metoCLOPramide (REGLAN) injection  10 mg Intravenous Q6H  . metoprolol tartrate  5 mg Intravenous Q6H  . pantoprazole sodium  40 mg Per Tube QHS  . sodium chloride flush  10-40 mL Intracatheter Q12H  . sodium chloride flush  3 mL Intravenous Q12H  . ticagrelor  90 mg Per Tube BID   Continuous Infusions: . sodium chloride    . sodium chloride Stopped (07/23/20 0948)  . sodium chloride Stopped (07/22/20 0955)  . cefTRIAXone (ROCEPHIN)  IV 2 g (07/24/20 0826)  . fentaNYL infusion INTRAVENOUS 100 mcg/hr (07/24/20 0800)  . lactated ringers    . midazolam 5 mg/hr (07/24/20 0800)  . norepinephrine (LEVOPHED) Adult infusion Stopped (07/22/20 1023)  . potassium PHOSPHATE IVPB (in mmol) 86 mL/hr at 07/24/20 0800  . propofol (DIPRIVAN) infusion 50 mcg/kg/min (07/24/20 0800)   PRN Meds: sodium chloride, acetaminophen, fentaNYL, midazolam, ondansetron (ZOFRAN) IV, polyethylene glycol, sodium chloride flush, sodium chloride flush   Vital Signs    Vitals:   07/24/20 0630 07/24/20 0645 07/24/20 0700 07/24/20 0734  BP: 110/76 120/77 119/78 118/77  Pulse:      Resp: (!) 30 (!) 30 (!) 30   Temp:      TempSrc:      SpO2:    100%  Weight:      Height:         Intake/Output Summary (Last 24 hours) at 07/24/2020 0851 Last data filed at 07/24/2020 0800 Gross per 24 hour  Intake 2133.37 ml  Output 1060 ml  Net 1073.37 ml   Last 3 Weights 07/24/2020 07/23/2020 07/22/2020  Weight (lbs) 214 lb 8.1 oz 215 lb 6.2 oz 224 lb 13.9 oz  Weight (kg) 97.3 kg 97.7 kg 102 kg      Telemetry     Sinus - Personally Reviewed  ECG    No AM EKG - Personally Reviewed  Physical Exam   GEN: Intubated, sedated.  Neck: No JVD Cardiac: RRR, no murmurs, rubs, or gallops.  Respiratory: Clear to auscultation bilaterally. GI: Soft, nontender, non-distended  MS: unable to assess Neuro:  unable to assess Psych: sedated  Labs    High Sensitivity Troponin:   Recent Labs  Lab 07/22/20 0418 07/22/20 1220  TROPONINIHS 818* 24,533*      Chemistry Recent Labs  Lab 07/22/20 0418 07/22/20 0933 07/22/20 2047 07/23/20 0434 07/24/20 0425  NA 138   < > 140 138 138  K 3.8   < > 4.1 3.7 3.2*  CL 104   < > 106 106 108  CO2 15*   < > 21* 21* 20*  GLUCOSE 286*   < > 119* 101* 140*  BUN  12   < > 17 19 27*  CREATININE 1.65*   < > 1.48* 1.49* 1.74*  CALCIUM 8.6*   < > 8.7* 8.6* 8.1*  PROT 5.7*  --   --  6.2* 5.5*  ALBUMIN 3.0*  --   --  3.1* 2.5*  AST 112*  --   --  147* 93*  ALT 97*  --   --  92* 64*  ALKPHOS 78  --   --  54 53  BILITOT 0.8  --   --  1.1 1.3*  GFRNONAA 48*   < > 55* 54* 45*  ANIONGAP 19*   < > '13 11 10   ' < > = values in this interval not displayed.     Hematology Recent Labs  Lab 07/22/20 0418 07/22/20 0418 07/22/20 0933 07/23/20 0434 07/24/20 0425  WBC 16.6*  --   --  14.2* 11.6*  RBC 4.31  --   --  4.47 3.80*  HGB 14.3   < > 15.0 14.5 12.6*  HCT 44.5   < > 44.0 43.6 37.0*  MCV 103.2*  --   --  97.5 97.4  MCH 33.2  --   --  32.4 33.2  MCHC 32.1  --   --  33.3 34.1  RDW 12.4  --   --  12.6 13.2  PLT 165  --   --  164 132*   < > = values in this interval not displayed.    BNPNo results for input(s): BNP, PROBNP in the  last 168 hours.   DDimer No results for input(s): DDIMER in the last 168 hours.   Radiology    CT HEAD WO CONTRAST  Result Date: 07/22/2020 CLINICAL DATA:  Acute neuro deficit.  Cardiac arrest. EXAM: CT HEAD WITHOUT CONTRAST TECHNIQUE: Contiguous axial images were obtained from the base of the skull through the vertex without intravenous contrast. COMPARISON:  None. FINDINGS: Brain: No evidence of acute infarction, hemorrhage, hydrocephalus, extra-axial collection or mass lesion/mass effect. Vascular: Vascular enhancement due to coronary angiography earlier today. Skull: Negative Sinuses/Orbits: Mucosal edema in the paranasal sinuses with air-fluid level in the maxillary sinus bilaterally. Negative orbit Other: None IMPRESSION: Negative CT head. Note patient had intravenous contrast earlier today for coronary angiography Mucosal edema throughout the paranasal sinuses with air-fluid levels in the maxillary sinus bilaterally. Electronically Signed   By: Franchot Gallo M.D.   On: 07/22/2020 09:39   DG Chest Port 1 View  Result Date: 07/23/2020 CLINICAL DATA:  Recent cardiac arrest EXAM: PORTABLE CHEST 1 VIEW COMPARISON:  07/22/2020 FINDINGS: Endotracheal tube, gastric catheter and right jugular central line are again seen and stable. No pneumothorax is noted. Lungs are clear bilaterally. No bony abnormality is noted. IMPRESSION: No acute abnormality noted. Electronically Signed   By: Inez Catalina M.D.   On: 07/23/2020 09:23   DG CHEST PORT 1 VIEW  Result Date: 07/22/2020 CLINICAL DATA:  Check central line placement EXAM: PORTABLE CHEST 1 VIEW COMPARISON:  07/22/2020 FINDINGS: Cardiac shadow is stable. Endotracheal tube and gastric catheter are again noted and stable. New right jugular central line is noted in the proximal superior vena cava. No pneumothorax is seen. Mild vascular congestion is noted. Previously seen right basilar atelectasis has resolved. New left basilar atelectasis is seen.  IMPRESSION: New left basilar atelectasis. No evidence of pneumothorax following right sided central venous line placement. Electronically Signed   By: Inez Catalina M.D.   On: 07/22/2020 12:17   EEG adult  Result Date: 07/22/2020 Lora Havens, MD     07/22/2020 12:56 PM Patient Name: Antonio Navarro MRN: [id number] Epilepsy Attending: Lora Havens Referring Physician/Provider: Dr Ina Homes Date: 07/22/2020 Duration: 23.10 mins Patient history: 59yo M s/p cardiac arrest. EEG to evaluate for seizure Level of alertness: comatose AEDs during EEG study: Propofol Technical aspects: This EEG study was done with scalp electrodes positioned according to the 10-20 International system of electrode placement. Electrical activity was acquired at a sampling rate of '500Hz'  and reviewed with a high frequency filter of '70Hz'  and a low frequency filter of '1Hz' . EEG data were recorded continuously and digitally stored. Description: EEG showed continuous generalized background attenuation. EEG is not reactive to noxious stimulation. Hyperventilation and photic stimulation were not performed.   ABNORMALITY - Background attenuation, generalized IMPRESSION: This study is suggestive of profound diffuse encephalopathy, nonspecific etiology but likely related to sedation, anoxic-hypoxic injury. No seizures or epileptiform discharges were seen throughout the recording. Priyanka Barbra Sarks   Overnight EEG with video  Result Date: 07/23/2020 Lora Havens, MD     07/24/2020  8:47 AM Patient Name: Antonio Navarro MRN: [id number] Epilepsy Attending: Lora Havens Referring Physician/Provider: Dr Ina Homes Duration: 07/22/2020 1043 to 12/5/202 1043  Patient history: 59yo M s/p cardiac arrest. EEG to evaluate for seizure  Level of alertness: comatose  AEDs during EEG study: Propofol  Technical aspects: This EEG study was done with scalp electrodes positioned according to the 10-20 International system of electrode placement.  Electrical activity was acquired at a sampling rate of '500Hz'  and reviewed with a high frequency filter of '70Hz'  and a low frequency filter of '1Hz' . EEG data were recorded continuously and digitally stored.  Description: EEG initially showed generalized attenuation which gradually evolved to burst attenuation with bursts of generalized 4-'5hz'  theta slowing lasting 1-2 seconds and attenuation for 5-7 seconds. Gradually the periods of attenuation became shorter and eeg appeared discontinuous. Event button was pressed on 07/22/2020 at 1259, 1319, 1321 and 1401 during which patient was noted to be trembling/shivering. Concomitant eeg before, during and after the event didn't show any eeg change to suggest seizure  ABNORMALITY - Burst attenuation, generalized  IMPRESSION: This study is suggestive of profound diffuse encephalopathy, nonspecific etiology but likely related to sedation, anoxic-hypoxic injury. No seizures or epileptiform discharges were seen throughout the recording. Event button was pressed on 07/22/2020 at 1259, 1319, 1321 and 1401 during which patient was noted to be trembling/shivering without concomitant eeg change and were NOT epileptic. EEG appears to be improving compared to routine eeg.  Lora Havens   ECHOCARDIOGRAM COMPLETE  Result Date: 07/23/2020    ECHOCARDIOGRAM REPORT   Patient Name:   Antonio Navarro [facility] Date of Exam: 07/23/2020 Medical Rec #:  [id number]      Height:       72.0 in Accession #:    [id number]     Weight:       215.4 lb Date of Birth:  1961-06-24      BSA:          2.199 m Patient Age:    59 years       BP:           110/82 mmHg Patient Gender: M              HR:           71 bpm. Exam Location:  Inpatient Procedure: 2D Echo, Cardiac Doppler, Color Doppler and Intracardiac  Opacification Agent Indications:    STEMI I21.3  History:        Patient has no prior history of Echocardiogram examinations.                 Risk Factors:Hypertension and Current Smoker.   Sonographer:    Vickie Epley RDCS Referring Phys: [phone] TRACI R TURNER  Sonographer Comments: Echo performed with patient supine and on artificial respirator and Technically difficult study due to poor echo windows. IMPRESSIONS  1. Left ventricular ejection fraction, by estimation, is 40 to 45%. The left ventricle has mildly decreased function. Left ventricular endocardial border not optimally defined to evaluate regional wall motion, but regional wall motion abnormalities are present. There is mild left ventricular hypertrophy. Left ventricular diastolic parameters are consistent with Grade I diastolic dysfunction (impaired relaxation).  2. Right ventricular systolic function is mildly reduced. The right ventricular size is normal. Tricuspid regurgitation signal is inadequate for assessing PA pressure.  3. The aortic valve is tricuspid. Aortic valve regurgitation is not visualized. No aortic stenosis is present.  4. The mitral valve is grossly normal. No evidence of mitral valve regurgitation. No evidence of mitral stenosis.  5. The inferior vena cava is normal in size with <50% respiratory variability, suggesting right atrial pressure of 8 mmHg. FINDINGS  Left Ventricle: Left ventricular ejection fraction, by estimation, is 40 to 45%. The left ventricle has mildly decreased function. Left ventricular endocardial border not optimally defined to evaluate regional wall motion. Definity contrast agent was given IV to delineate the left ventricular endocardial borders. The left ventricular internal cavity size was normal in size. There is mild left ventricular hypertrophy. Left ventricular diastolic parameters are consistent with Grade I diastolic dysfunction (impaired relaxation).  LV Wall Scoring: The posterior wall and basal inferior segment are hypokinetic. Right Ventricle: The right ventricular size is normal. No increase in right ventricular wall thickness. Right ventricular systolic function is mildly reduced.  Tricuspid regurgitation signal is inadequate for assessing PA pressure. Left Atrium: Left atrial size was normal in size. Right Atrium: Right atrial size was normal in size. Pericardium: There is no evidence of pericardial effusion. Mitral Valve: The mitral valve is grossly normal. No evidence of mitral valve regurgitation. No evidence of mitral valve stenosis. Tricuspid Valve: The tricuspid valve is grossly normal. Tricuspid valve regurgitation is trivial. No evidence of tricuspid stenosis. Aortic Valve: The aortic valve is tricuspid. Aortic valve regurgitation is not visualized. No aortic stenosis is present. Pulmonic Valve: The pulmonic valve was not well visualized. Pulmonic valve regurgitation is not visualized. Aorta: The aortic root and ascending aorta are structurally normal, with no evidence of dilitation. Venous: The inferior vena cava is normal in size with less than 50% respiratory variability, suggesting right atrial pressure of 8 mmHg. IAS/Shunts: The interatrial septum was not well visualized.  LEFT VENTRICLE PLAX 2D LVIDd:         4.70 cm  Diastology LVIDs:         4.10 cm  LV e' medial:    3.65 cm/s LV PW:         1.26 cm  LV E/e' medial:  10.1 LV IVS:        1.29 cm  LV e' lateral:   4.54 cm/s LVOT diam:     2.40 cm  LV E/e' lateral: 8.1 LV SV:         52 LV SV Index:   23 LVOT Area:     4.52 cm  RIGHT VENTRICLE  RV S prime:     9.58 cm/s TAPSE (M-mode): 1.5 cm LEFT ATRIUM             Index       RIGHT ATRIUM           Index LA diam:        2.80 cm 1.27 cm/m  RA Area:     12.20 cm LA Vol (A2C):   28.3 ml 12.87 ml/m RA Volume:   29.80 ml  13.55 ml/m LA Vol (A4C):   24.5 ml 11.14 ml/m LA Biplane Vol: 29.0 ml 13.19 ml/m  AORTIC VALVE LVOT Vmax:   74.20 cm/s LVOT Vmean:  47.800 cm/s LVOT VTI:    0.114 m  AORTA Ao Root diam: 3.40 cm MITRAL VALVE MV Area (PHT): 3.16 cm    SHUNTS MV Decel Time: 240 msec    Systemic VTI:  0.11 m MV E velocity: 37.00 cm/s  Systemic Diam: 2.40 cm MV A velocity: 54.80  cm/s MV E/A ratio:  0.68 Cherlynn Kaiser MD Electronically signed by Cherlynn Kaiser MD Signature Date/Time: 07/23/2020/2:06:22 PM    Final     Cardiac Studies   Echo 07/23/20:   1. Left ventricular ejection fraction, by estimation, is 40 to 45%. The  left ventricle has mildly decreased function. Left ventricular endocardial  border not optimally defined to evaluate regional wall motion, but  regional wall motion abnormalities are  present. There is mild left ventricular hypertrophy. Left ventricular  diastolic parameters are consistent with Grade I diastolic dysfunction  (impaired relaxation).  2. Right ventricular systolic function is mildly reduced. The right  ventricular size is normal. Tricuspid regurgitation signal is inadequate  for assessing PA pressure.  3. The aortic valve is tricuspid. Aortic valve regurgitation is not  visualized. No aortic stenosis is present.  4. The mitral valve is grossly normal. No evidence of mitral valve  regurgitation. No evidence of mitral stenosis.  5. The inferior vena cava is normal in size with <50% respiratory  variability, suggesting right atrial pressure of 8 mmHg.   Patient Profile     59 y.o. male with history of tobacco abuse, obesity, HTN who was admitted 07/22/20 post cardiac arrest with inferolateral STEMI. He was resuscitated in the field by EMS after being found to be in V fib. He was intubated on arrival. Cardiac cath with 95% Circumflex stenosis treated with a drug eluting stent. LvEF=40-45% by echo 07/23/20.    Assessment & Plan    1. Cardiac arrest secondary to acute MI: s/p stenting of the Circumflex. He has been cooled. Hemodynamically stable. Continue ASA, Brlinta, statin and beta blocker.   2. Ischemic cardiomyopathy: LvEF=40-45%. Will continue beta blocker. Will add Ace-inh later this week.   3. Hypoxic respiratory failure/Anoxic encephalopathy: PCCM and Neurology following.   No cardiac recommendations today.   For  questions or updates, please contact [facility] Please consult [email or web address] for contact info under      Signed, Lauree Chandler, MD  07/24/2020, 8:51 AM

## 2020-07-25 ENCOUNTER — Inpatient Hospital Stay (HOSPITAL_COMMUNITY): Payer: Medicaid Other

## 2020-07-25 DIAGNOSIS — G9340 Encephalopathy, unspecified: Secondary | ICD-10-CM

## 2020-07-25 LAB — BASIC METABOLIC PANEL
Anion gap: 10 (ref 5–15)
BUN: 28 mg/dL — ABNORMAL HIGH (ref 6–20)
CO2: 19 mmol/L — ABNORMAL LOW (ref 22–32)
Calcium: 8.2 mg/dL — ABNORMAL LOW (ref 8.9–10.3)
Chloride: 110 mmol/L (ref 98–111)
Creatinine, Ser: 1.56 mg/dL — ABNORMAL HIGH (ref 0.61–1.24)
GFR, Estimated: 51 mL/min — ABNORMAL LOW (ref >60)
Glucose, Bld: 191 mg/dL — ABNORMAL HIGH (ref 70–99)
Potassium: 3.7 mmol/L (ref 3.5–5.1)
Sodium: 139 mmol/L (ref 135–145)

## 2020-07-25 LAB — CBC
HCT: 32.7 % — ABNORMAL LOW (ref 39.0–52.0)
Hemoglobin: 11.2 g/dL — ABNORMAL LOW (ref 13.0–17.0)
MCH: 33.6 pg (ref 26.0–34.0)
MCHC: 34.3 g/dL (ref 30.0–36.0)
MCV: 98.2 fL (ref 80.0–100.0)
Platelets: 131 10*3/uL — ABNORMAL LOW (ref 150–400)
RBC: 3.33 MIL/uL — ABNORMAL LOW (ref 4.22–5.81)
RDW: 13.1 % (ref 11.5–15.5)
WBC: 10.1 10*3/uL (ref 4.0–10.5)
nRBC: 0 % (ref 0.0–0.2)

## 2020-07-25 LAB — GLUCOSE, CAPILLARY
Glucose-Capillary: 117 mg/dL — ABNORMAL HIGH (ref 70–99)
Glucose-Capillary: 136 mg/dL — ABNORMAL HIGH (ref 70–99)
Glucose-Capillary: 139 mg/dL — ABNORMAL HIGH (ref 70–99)
Glucose-Capillary: 147 mg/dL — ABNORMAL HIGH (ref 70–99)
Glucose-Capillary: 191 mg/dL — ABNORMAL HIGH (ref 70–99)

## 2020-07-25 LAB — HEPATIC FUNCTION PANEL
ALT: 47 U/L — ABNORMAL HIGH (ref 0–44)
AST: 71 U/L — ABNORMAL HIGH (ref 15–41)
Albumin: 2.4 g/dL — ABNORMAL LOW (ref 3.5–5.0)
Alkaline Phosphatase: 63 U/L (ref 38–126)
Bilirubin, Direct: 0.5 mg/dL — ABNORMAL HIGH (ref 0.0–0.2)
Indirect Bilirubin: 0.9 mg/dL (ref 0.3–0.9)
Total Bilirubin: 1.4 mg/dL — ABNORMAL HIGH (ref 0.3–1.2)
Total Protein: 5.7 g/dL — ABNORMAL LOW (ref 6.5–8.1)

## 2020-07-25 LAB — PHOSPHORUS: Phosphorus: 2.3 mg/dL — ABNORMAL LOW (ref 2.5–4.6)

## 2020-07-25 LAB — MAGNESIUM: Magnesium: 2.2 mg/dL (ref 1.7–2.4)

## 2020-07-25 IMAGING — MR MR HEAD W/O CM
6 of 10 series · 28 of 48 positions shown · non-contrast
Comparison: Head CT [DATE].
COMPARISON: Head CT [DATE].

Addendum:
CLINICAL DATA: 59-year-old male with worsening encephalopathy
status post cardiac arrest.

EXAM:
MRI HEAD WITHOUT CONTRAST
TECHNIQUE: Multiplanar, multiecho pulse sequences of the brain and surrounding
structures were obtained without intravenous contrast.

[Series 2: DWI · axial · 3.0mm · 0.94mm/px · z∈[-38,+102]mm · 8 of 100 slices shown (1 of 2)]
[im 1/100]
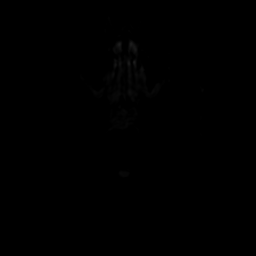
[im 12/100]
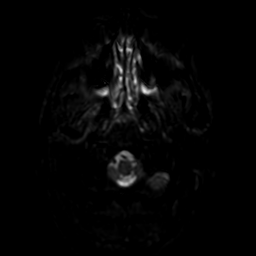
[im 34/100]
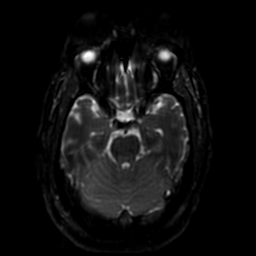
[im 45/100]
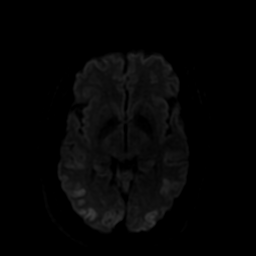
[im 56/100]
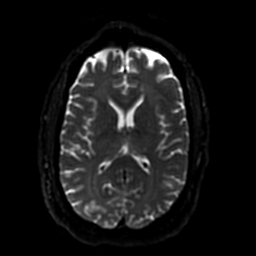
[im 67/100]
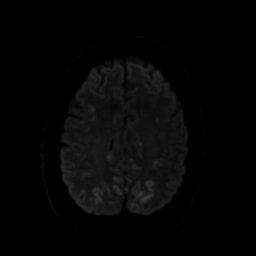
[im 89/100]
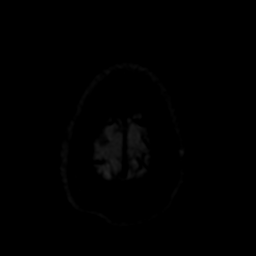
[im 100/100]
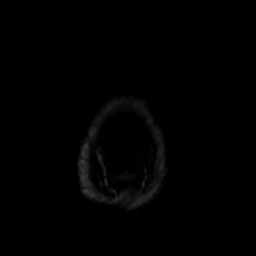

[Series 3: DWI · coronal · 4.0mm · 0.94mm/px · 7 of 76 slices shown (2 of 2)]
[im 1/76]
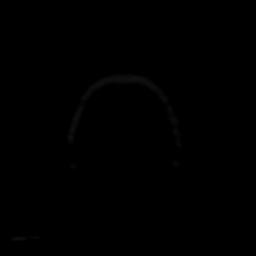
[im 13/76]
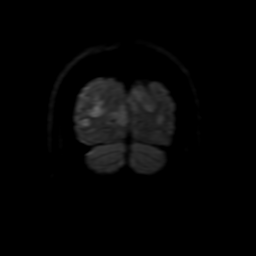
[im 26/76]
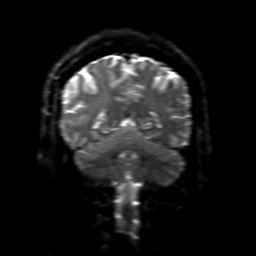
[im 38/76]
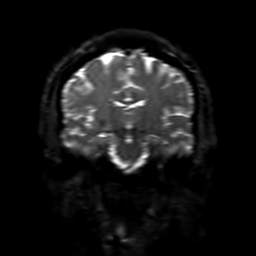
[im 51/76]
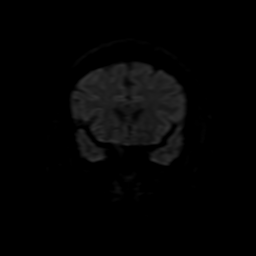
[im 63/76]
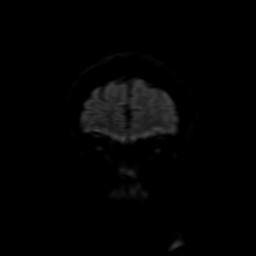
[im 76/76]
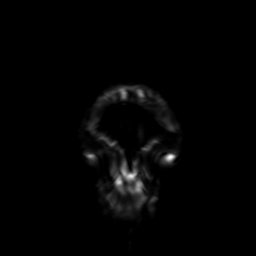

[Series 4: FLAIR · sagittal · 5.0mm · 0.23mm/px · 2 of 24 slices shown (1 of 2)]
[im 1/24]
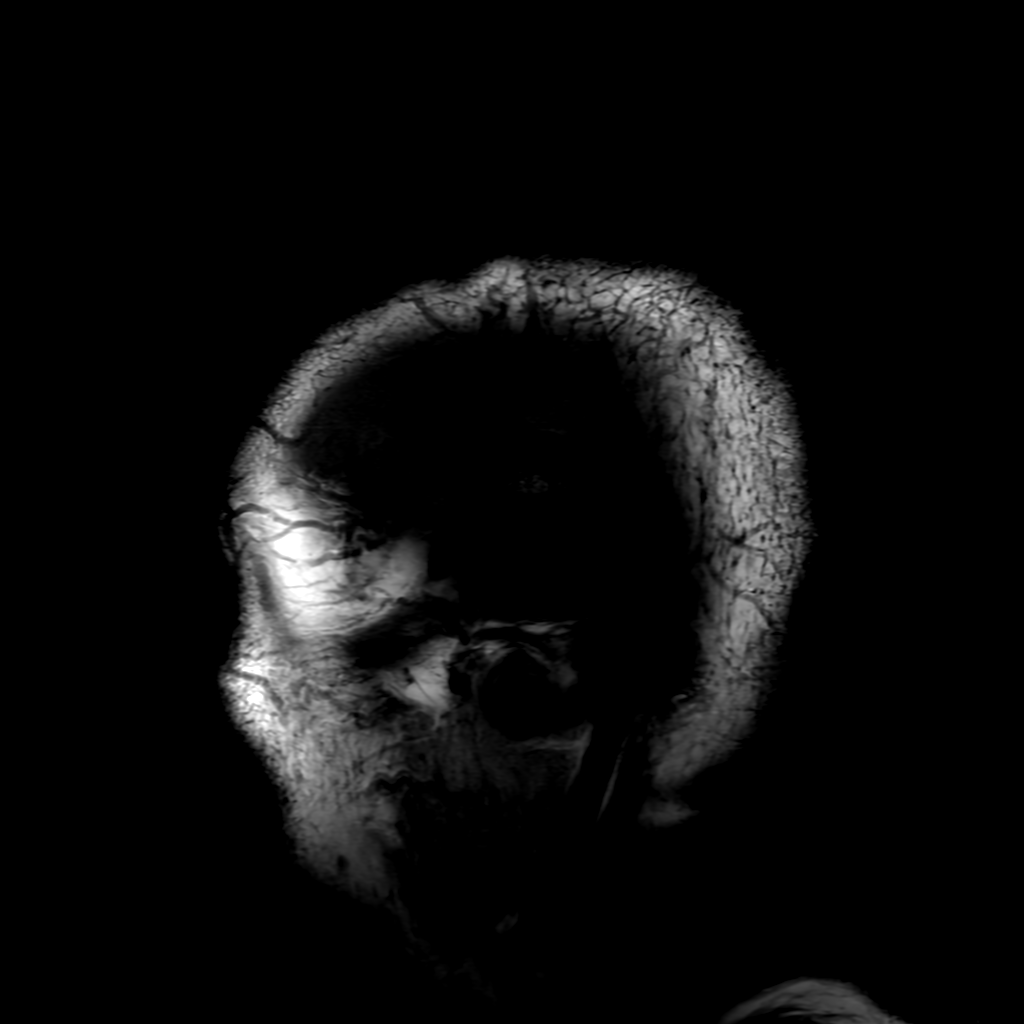
[im 24/24]
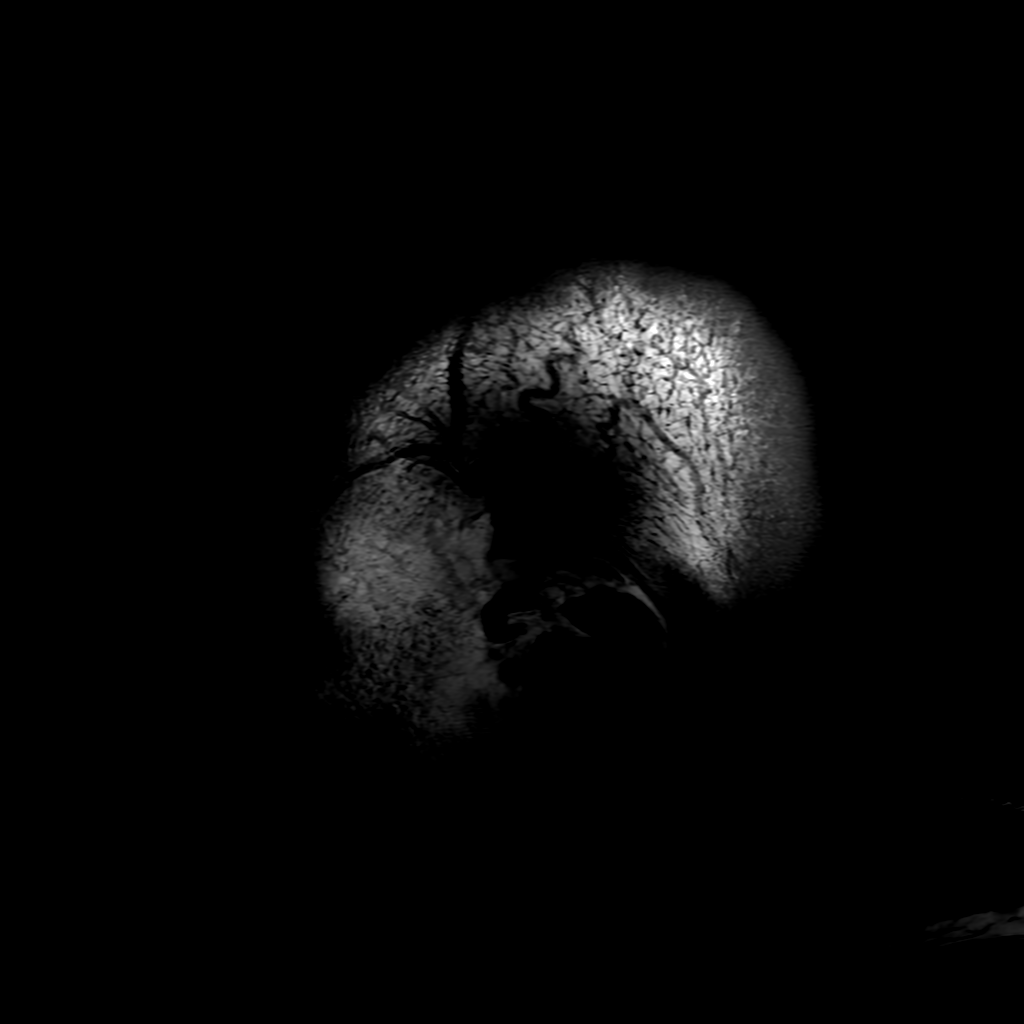

[Series 6: FLAIR · axial · 3.0mm · 0.45mm/px · z∈[-38,+99]mm · 2 of 25 slices shown (2 of 2)]
[im 1/25]
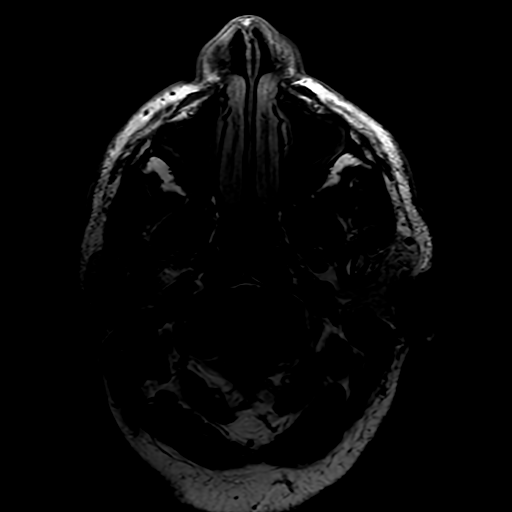
[im 25/25]
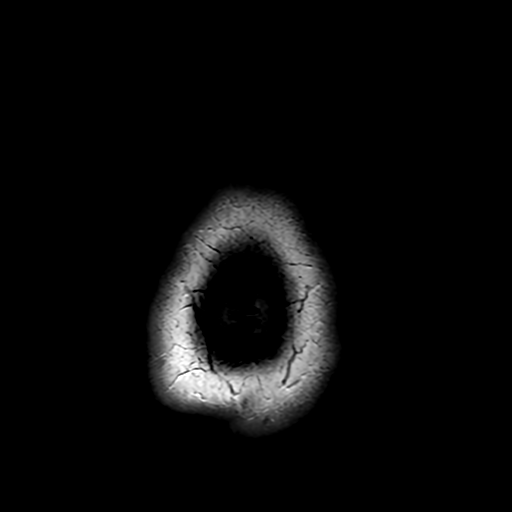

[Series 250: ADC · axial · 3.0mm · 0.94mm/px · z∈[-38,+102]mm · 5 of 50 slices shown (1 of 2)]
[im 1/50]
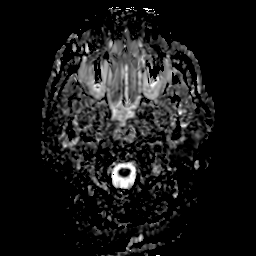
[im 13/50]
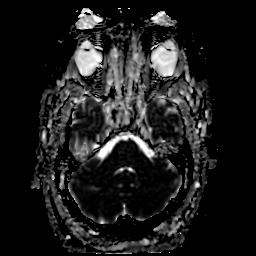
[im 25/50]
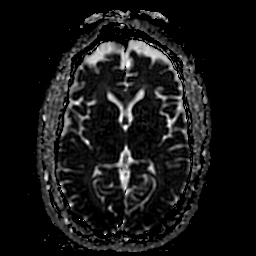
[im 37/50]
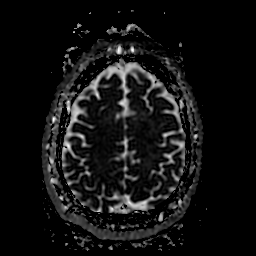
[im 50/50]
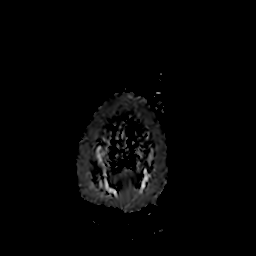

[Series 350: ADC · coronal · 4.0mm · 0.94mm/px · 4 of 38 slices shown (2 of 2)]
[im 1/38]
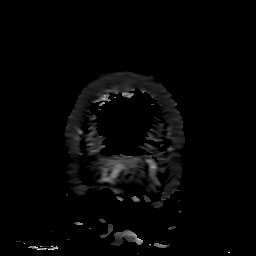
[im 13/38]
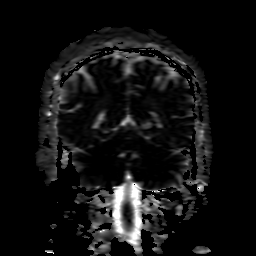
[im 25/38]
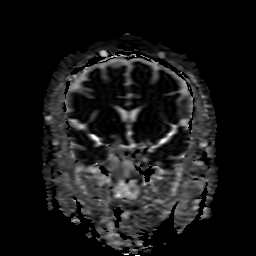
[im 38/38]
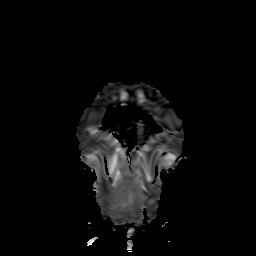

[28 of 48 positions shown; findings below may reference images not displayed]

FINDINGS: Brain: Widespread abnormal cortical signal abnormality with a
posterior circulation and superior bifrontal predominance. The
appearance consists of intense abnormal trace diffusion (series 2,
image 25), with isointense ADC, associated T2 and FLAIR
hyperintensity. Similar scattered subcortical white matter
involvement also in both hemispheres.

There is more subtle abnormal signal in the bilateral thalami
(series 6 image 14). The basal ganglia appear within normal limits.
The brainstem is spared. Occasional small areas of restricted
diffusion in the cerebellum (series 2, image 9).

No superimposed acute intracranial hemorrhage, mass effect, or
ventriculomegaly. No evidence of mass lesion, extra-axial
collection. Cervicomedullary junction and pituitary are within
normal limits.

Vascular: Major intracranial vascular flow voids are preserved.

Skull and upper cervical spine: Negative. Visualized bone marrow
signal is within normal limits.

Sinuses/Orbits: Negative orbits. Intubated with fluid and mucosal
thickening in the bilateral paranasal sinuses and visible pharynx.

Other: Mild bilateral mastoid effusions.
IMPRESSION: 1. Fairly symmetric abnormal signal is widespread in the hemispheric
cortex and subcortical white matter with a posterior circulation
predominance.
Although posterior reversible encephalopathy syndrome (PRES) can
have a similar appearance, the lack of facilitated diffusion in this
case favors widespread Anoxic Injury.
Bithalamic involvement also suspected. Trace cerebellar involvement.

2. No associated hemorrhage or mass effect.

ADDENDUM:
Study discussed by telephone [REDACTED]. YATZANI on
[DATE] at [AJ] hours. I advised that I feel Neurology
consultation would be valuable.

*** End of Addendum ***
FINDINGS: Brain: Widespread abnormal cortical signal abnormality with a
posterior circulation and superior bifrontal predominance. The
appearance consists of intense abnormal trace diffusion (series 2,
image 25), with isointense ADC, associated T2 and FLAIR
hyperintensity. Similar scattered subcortical white matter
involvement also in both hemispheres.

There is more subtle abnormal signal in the bilateral thalami
(series 6 image 14). The basal ganglia appear within normal limits.
The brainstem is spared. Occasional small areas of restricted
diffusion in the cerebellum (series 2, image 9).

No superimposed acute intracranial hemorrhage, mass effect, or
ventriculomegaly. No evidence of mass lesion, extra-axial
collection. Cervicomedullary junction and pituitary are within
normal limits.

Vascular: Major intracranial vascular flow voids are preserved.

Skull and upper cervical spine: Negative. Visualized bone marrow
signal is within normal limits.

Sinuses/Orbits: Negative orbits. Intubated with fluid and mucosal
thickening in the bilateral paranasal sinuses and visible pharynx.

Other: Mild bilateral mastoid effusions.
IMPRESSION: 1. Fairly symmetric abnormal signal is widespread in the hemispheric
cortex and subcortical white matter with a posterior circulation
predominance.
Although posterior reversible encephalopathy syndrome (PRES) can
have a similar appearance, the lack of facilitated diffusion in this
case favors widespread Anoxic Injury.
Bithalamic involvement also suspected. Trace cerebellar involvement.

2. No associated hemorrhage or mass effect.

## 2020-07-25 MED ORDER — ACETAMINOPHEN 325 MG PO TABS
650.0000 mg | ORAL_TABLET | Freq: Four times a day (QID) | ORAL | Status: DC
  Administered 2020-07-25: 650 mg via ORAL
  Filled 2020-07-25: qty 2

## 2020-07-25 MED ORDER — ACETAMINOPHEN 160 MG/5ML PO SOLN
650.0000 mg | Freq: Four times a day (QID) | ORAL | Status: DC
  Administered 2020-07-25 – 2020-07-29 (×15): 650 mg
  Filled 2020-07-25 (×15): qty 20.3

## 2020-07-25 MED ORDER — METOPROLOL TARTRATE 25 MG/10 ML ORAL SUSPENSION
25.0000 mg | Freq: Two times a day (BID) | ORAL | Status: DC
  Administered 2020-07-25 – 2020-07-26 (×4): 25 mg
  Filled 2020-07-25 (×4): qty 10

## 2020-07-25 MED ORDER — METOPROLOL TARTRATE 25 MG PO TABS: 25.0000 mg | ORAL_TABLET | Freq: Two times a day (BID) | ORAL | Status: DC

## 2020-07-25 MED ORDER — METOPROLOL TARTRATE 5 MG/5ML IV SOLN
5.0000 mg | Freq: Four times a day (QID) | INTRAVENOUS | Status: DC | PRN
  Administered 2020-07-25 – 2020-08-01 (×5): 5 mg via INTRAVENOUS
  Filled 2020-07-25 (×8): qty 5

## 2020-07-25 MED ORDER — K PHOS MONO-SOD PHOS DI & MONO 155-852-130 MG PO TABS
250.0000 mg | ORAL_TABLET | Freq: Once | ORAL | Status: AC
  Administered 2020-07-25: 250 mg via ORAL
  Filled 2020-07-25: qty 1

## 2020-07-25 MED FILL — Heparin Sod (Porcine)-NaCl IV Soln 1000 Unit/500ML-0.9%: INTRAVENOUS | Qty: 1000 | Status: AC

## 2020-07-25 MED FILL — Nitroglycerin IV Soln 100 MCG/ML in D5W: INTRA_ARTERIAL | Qty: 10 | Status: AC

## 2020-07-25 MED FILL — Verapamil HCl IV Soln 2.5 MG/ML: INTRAVENOUS | Qty: 2 | Status: AC

## 2020-07-25 NOTE — Progress Notes (Signed)
Progress Note  Patient Name: Antonio Navarro Date of Encounter: 07/25/2020  [facility] Cardiologist: Fransico Him, MD   Subjective   Intubated, unresponsive.  Fianc at bedside.  Inpatient Medications    Scheduled Meds: . acetaminophen  650 mg Oral Q6H  . aspirin  81 mg Per Tube Daily  . atorvastatin  80 mg Per Tube Daily  . chlorhexidine gluconate (MEDLINE KIT)  15 mL Mouth Rinse BID  . Chlorhexidine Gluconate Cloth  6 each Topical Daily  . fentaNYL (SUBLIMAZE) injection  50 mcg Intravenous Once  . heparin  5,000 Units Subcutaneous Q8H  . insulin aspart  0-24 Units Subcutaneous Q4H  . mouth rinse  15 mL Mouth Rinse 10 times per day  . metoCLOPramide (REGLAN) injection  10 mg Intravenous Q6H  . metoprolol tartrate  5 mg Intravenous Q6H  . pantoprazole sodium  40 mg Per Tube QHS  . sodium chloride flush  10-40 mL Intracatheter Q12H  . sodium chloride flush  3 mL Intravenous Q12H  . ticagrelor  90 mg Per Tube BID   Continuous Infusions: . sodium chloride    . sodium chloride Stopped (07/23/20 0948)  . sodium chloride Stopped (07/22/20 0955)  . cefTRIAXone (ROCEPHIN)  IV Stopped (07/25/20 0829)  . clevidipine Stopped (07/24/20 1848)  . dexmedetomidine (PRECEDEX) IV infusion 0.6 mcg/kg/hr (07/25/20 0849)  . feeding supplement (VITAL HIGH PROTEIN) 1,000 mL (07/25/20 0759)  . fentaNYL infusion INTRAVENOUS 50 mcg/hr (07/25/20 0849)  . midazolam Stopped (07/25/20 0453)  . norepinephrine (LEVOPHED) Adult infusion Stopped (07/22/20 1023)  . propofol (DIPRIVAN) infusion Stopped (07/24/20 1357)   PRN Meds: sodium chloride, acetaminophen, fentaNYL, midazolam, ondansetron (ZOFRAN) IV, polyethylene glycol, sodium chloride flush, sodium chloride flush   Vital Signs    Vitals:   07/25/20 0700 07/25/20 0732 07/25/20 0733 07/25/20 0800  BP: (!) 137/91 (!) 184/106  133/86  Pulse:  72    Resp: (!) 22 (!) 30  (!) 30  Temp:    99 F (37.2 C)  TempSrc:    Bladder  SpO2:  100%  100% 100%  Weight:      Height:        Intake/Output Summary (Last 24 hours) at 07/25/2020 1017 Last data filed at 07/25/2020 0849 Gross per 24 hour  Intake 2543.6 ml  Output 1325 ml  Net 1218.6 ml   Last 3 Weights 07/25/2020 07/25/2020 07/24/2020  Weight (lbs) 227 lb 11.8 oz 227 lb 11.8 oz 214 lb 8.1 oz  Weight (kg) 103.3 kg 103.3 kg 97.3 kg      Telemetry    Sinus rhythm- Personally Reviewed   Physical Exam  Intubated, sedated, unresponsive GEN: No acute distress.   Neck: No JVD Cardiac: RRR, no murmurs, rubs, or gallops.  Respiratory: Clear to auscultation bilaterally. GI: Soft, nontender, non-distended  MS: No edema; No deformity. Neuro:  Nonresponsive Psych: Unable to assess due to neurologic status  Labs    High Sensitivity Troponin:   Recent Labs  Lab 07/22/20 0418 07/22/20 1220  TROPONINIHS 818* 24,533*      Chemistry Recent Labs  Lab 07/23/20 0434 07/23/20 0434 07/24/20 0425 07/24/20 1734 07/25/20 0343  NA 138  --  138  --  139  K 3.7   < > 3.2* 4.2 3.7  CL 106  --  108  --  110  CO2 21*  --  20*  --  19*  GLUCOSE 101*  --  140*  --  191*  BUN 19  --  27*  --  28*  CREATININE 1.49*  --  1.74*  --  1.56*  CALCIUM 8.6*  --  8.1*  --  8.2*  PROT 6.2*  --  5.5*  --  5.7*  ALBUMIN 3.1*  --  2.5*  --  2.4*  AST 147*  --  93*  --  71*  ALT 92*  --  64*  --  47*  ALKPHOS 54  --  53  --  63  BILITOT 1.1  --  1.3*  --  1.4*  GFRNONAA 54*  --  45*  --  51*  ANIONGAP 11  --  10  --  10   < > = values in this interval not displayed.     Hematology Recent Labs  Lab 07/23/20 0434 07/24/20 0425 07/25/20 0343  WBC 14.2* 11.6* 10.1  RBC 4.47 3.80* 3.33*  HGB 14.5 12.6* 11.2*  HCT 43.6 37.0* 32.7*  MCV 97.5 97.4 98.2  MCH 32.4 33.2 33.6  MCHC 33.3 34.1 34.3  RDW 12.6 13.2 13.1  PLT 164 132* 131*    BNPNo results for input(s): BNP, PROBNP in the last 168 hours.   DDimer No results for input(s): DDIMER in the last 168 hours.   Radiology     No results found.  Cardiac Studies   Echo 07/23/2020: IMPRESSIONS    1. Left ventricular ejection fraction, by estimation, is 40 to 45%. The  left ventricle has mildly decreased function. Left ventricular endocardial  border not optimally defined to evaluate regional wall motion, but  regional wall motion abnormalities are  present. There is mild left ventricular hypertrophy. Left ventricular  diastolic parameters are consistent with Grade I diastolic dysfunction  (impaired relaxation).  2. Right ventricular systolic function is mildly reduced. The right  ventricular size is normal. Tricuspid regurgitation signal is inadequate  for assessing PA pressure.  3. The aortic valve is tricuspid. Aortic valve regurgitation is not  visualized. No aortic stenosis is present.  4. The mitral valve is grossly normal. No evidence of mitral valve  regurgitation. No evidence of mitral stenosis.  5. The inferior vena cava is normal in size with <50% respiratory  variability, suggesting right atrial pressure of 8 mmHg.   Patient Profile     59 y.o. male with history of tobacco abuse, obesity, HTN who was admitted 07/22/20 post cardiac arrest with inferolateral STEMI. He was resuscitated in the field by EMS after being found to be in V fib. He was intubated on arrival. Cardiac cath with 95% Circumflex stenosis treated with a drug eluting stent. LvEF=40-45% by echo 07/23/20.    Assessment & Plan    1.  Out of hospital cardiac arrest: Patient has undergone therapeutic hypothermia, remains unresponsive.  Discussed with fianc who is at the bedside.  I am concerned that his neurologic prognosis is poor.  Neurology/CCM following.  MRI planned for today. 2.  Acute MI, inferolateral wall: Status post PCI.  Patient on aspirin and ticagrelor. 3.  Hypoxic respiratory failure: Secondary to #1  We will follow, add beta-blocker per tube as he is requiring intermittent IV metoprolol for hypertension.  For  questions or updates, please contact [place] Please consult [email or web address] for contact info under     Signed, Sherren Mocha, MD  07/25/2020, 10:17 AM

## 2020-07-25 NOTE — Progress Notes (Addendum)
07/25/2020   I have seen and evaluated the patient for post cardiac arrest care.  S:  Rewarmed, sedation weaned. Opens eyes to voice but not tracking or following commands.  O: Blood pressure 133/86, pulse 69, temperature 99 F (37.2 C), temperature source Bladder, resp. rate (!) 30, height 6' (1.829 m), weight 103.3 kg, SpO2 100 %.  Not withdrawing to pain Does open eyes to voice Has pupillary/corneal/cough/gag/oculocephalic reflexes Triggers vent No peripheral edema Heart sounds regular, ext warm  Renal function slightly better as is mild transaminitis CBC stable No new imaging  A:  OOH Vfib arrest secondary to STEMI s/p PCI to Cx Metabolic and anoxic encephalopathy s/p TTM AKI improved  P:  - Continue supportive care including mechanical ventilation, mental status precludes extubation at present time - Ceftriaxone x 5 days - MRI brain today to help with prognostication, formal prognostication usually 72 post rewarming per most studies - Usual post PCI meds as ordered   Patient critically ill due to acute hypoxemic respiratory failure, encephalopathy Interventions to address this today MRI brian, vent titration Risk of deterioration without these interventions is high  I personally spent 39 minutes providing critical care not including any separately billable procedures  Erskine Emery MD [facility] 07/25/2020 11:56 AM Personal pager: [phone] If unanswered, please page CCM On-call: [phone]          NAME:  Antonio Navarro, MRN:  [id number], DOB:  [birth year], LOS: 3 ADMISSION DATE:  07/22/2020, CONSULTATION DATE: 07/22/2020 REFERRING MD: Dr. Roxanne Mins, CHIEF COMPLAINT: Status post arrest  Brief History   Patient is 59 year old male status post arrest  History of present illness   Patient is a 59 year old male brought to the emergency room after out-of-hospital cardiac arrest.  His wife apparently noticed that while he was sleeping he had  agonal breathing and called 911.  He was without resuscitative effort for about 9 minutes.  Patient was found to be apneic and pulls list with initial rhythm of ventricular fibrillation.  He was intubated with [facility] airway and received 6 cardioversion attempts.  He was loaded with amiodarone and got a total of 6 rounds of 1 mg epinephrine.  He had return of spontaneous circulation.  ER physician estimates that time of CPR efforts were 30 minutes.  King airway was replaced in the emergency room. At time of dictation patient is being taken to the cardiac Cath Lab.  On return of sinus rhythm the patient was found to have ST changes in the posterior and inferior leads.  Laboratory show an anion gap of 19 bicarb of 15 lactic acid is pending, glucose is 286, creatinine is 1.65 GFR estimated 45.  Past Medical History   . Hypertension   . Kidney stones      Significant Hospital Events   As above  Consults:  Cardiology, PCCM  Procedures:  As above  Significant Diagnostic Tests:  12/4 CT head>>no acute findings 12/5 Echo>>EF 50-53%, grade 1 diastolic dysfunction  Micro Data:  12/4 Covid and flu>>negative 12/4 MRSA>>neg 12/4 Resp culture>>  Antimicrobials:  Ceftriaxone 12/4-  Interim history/subjective:  No overnight events, SAT this morning with agitation, dyssynchrony and biting tube   Objective   Blood pressure 127/87, pulse 81, temperature 98.8 F (37.1 C), temperature source Bladder, resp. rate (!) 30, height 6' (1.829 m), weight 103.3 kg, SpO2 100 %.    Vent Mode: PRVC FiO2 (%):  [40 %-50 %] 40 % Set Rate:  [30 bmp] 30 bmp Vt Set:  [  620 mL] 620 mL PEEP:  [5 cmH20] 5 cmH20 Plateau Pressure:  [19 cmH20-31 cmH20] 19 cmH20   Intake/Output Summary (Last 24 hours) at 07/25/2020 0715 Last data filed at 07/25/2020 0600 Gross per 24 hour  Intake 3107.93 ml  Output 1600 ml  Net 1507.93 ml   Filed Weights   07/24/20 0500 07/25/20 0000 07/25/20 0400  Weight: 97.3 kg 103.3 kg 103.3  kg      Examination: Constitutional: critically ill M, intubated and sedated  Eyes: pupils 40mm and sluggishly responsive to light, rightward gaze deviation Ears, nose, mouth, and throat: ETT in place, minimal secretions  Cardiovascular: regular rate and rhythm,, no murmurs Respiratory: On full vent support PEEP 5, FiO2 40%, mechanical breath sounds bilaterally  Gastrointestinal: soft, +hypoactive BS Skin: No rashes, normal turgor Neurologic: With weaning of Precedex and Fentanyl, becomes restless and dyssynchronous.  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  Neuro status with sedation wean is poor, will discuss with family today and consider palliative care consult   Acute hypoxemic respiratory failure  secondary to post arrest encephalopathy and aspiration pneumonitis P: -Mental status precludes extubation, is to dyssynchronous with the ventilator this morning to tolerate pressure support -Maintain full vent support with SAT/SBT as tolerated -titrate Vent setting to maintain SpO2 greater than or equal to 90%. -HOB elevated 30 degrees. -Plateau pressures less than 30 cm H20.  -Follow chest x-ray, ABG prn.   -Bronchial hygiene and  RT/bronchodilator protocol. -- Ceftriaxone x 5 days as ordered for possible aspiration   AKI Creatinine improved to 1.5 today after IVF, appears near baseline based on prior Epic results P: -Continue monitoring renal indices, UOP and electrolytes      Best practice (evaluated daily)   Diet: continue TF Pain/Anxiety/Delirium protocol (if indicated): weaning VAP protocol (if indicated): Yes DVT prophylaxis: heparin GI prophylaxis: Yes Glucose control: Sliding scale insulin Mobility: Bedrest Family Communication: Fiance [name] updated Code Status: Full Disposition: ICU   CRITICAL CARE Performed by: Otilio Carpen Gleason   Total critical care time: 35 minutes  Critical care time was exclusive of separately billable procedures and treating other patients.  Critical care was necessary to treat or prevent imminent or life-threatening deterioration.  Critical care was time spent personally by me on the following activities: development of treatment plan with patient and/or surrogate as well as nursing, discussions with consultants, evaluation of patient's response to treatment, examination of patient, obtaining history from patient or surrogate, ordering and performing treatments and interventions, ordering and review of laboratory studies, ordering and review of radiographic studies, pulse oximetry and re-evaluation of patient's condition.    Otilio Carpen Gleason, PA-C [facility] [facility]  Pager# [phone], if no answer [phone]

## 2020-07-25 NOTE — Progress Notes (Signed)
RT NOTE: RT transported patient on ventilator from room 2H26 to MRI and back to room 0W40 with no complications. Vitals are stable. RT will continue to monitor.

## 2020-07-26 DIAGNOSIS — G9340 Encephalopathy, unspecified: Secondary | ICD-10-CM

## 2020-07-26 LAB — BASIC METABOLIC PANEL
Anion gap: 12 (ref 5–15)
BUN: 30 mg/dL — ABNORMAL HIGH (ref 6–20)
CO2: 18 mmol/L — ABNORMAL LOW (ref 22–32)
Calcium: 8.4 mg/dL — ABNORMAL LOW (ref 8.9–10.3)
Chloride: 110 mmol/L (ref 98–111)
Creatinine, Ser: 1.51 mg/dL — ABNORMAL HIGH (ref 0.61–1.24)
GFR, Estimated: 53 mL/min — ABNORMAL LOW (ref >60)
Glucose, Bld: 171 mg/dL — ABNORMAL HIGH (ref 70–99)
Potassium: 3.5 mmol/L (ref 3.5–5.1)
Sodium: 140 mmol/L (ref 135–145)

## 2020-07-26 LAB — GLUCOSE, CAPILLARY
Glucose-Capillary: 120 mg/dL — ABNORMAL HIGH (ref 70–99)
Glucose-Capillary: 152 mg/dL — ABNORMAL HIGH (ref 70–99)
Glucose-Capillary: 154 mg/dL — ABNORMAL HIGH (ref 70–99)
Glucose-Capillary: 167 mg/dL — ABNORMAL HIGH (ref 70–99)
Glucose-Capillary: 189 mg/dL — ABNORMAL HIGH (ref 70–99)
Glucose-Capillary: 224 mg/dL — ABNORMAL HIGH (ref 70–99)

## 2020-07-26 LAB — HEPATIC FUNCTION PANEL
ALT: 43 U/L (ref 0–44)
AST: 66 U/L — ABNORMAL HIGH (ref 15–41)
Albumin: 2.3 g/dL — ABNORMAL LOW (ref 3.5–5.0)
Alkaline Phosphatase: 105 U/L (ref 38–126)
Bilirubin, Direct: 0.6 mg/dL — ABNORMAL HIGH (ref 0.0–0.2)
Indirect Bilirubin: 0.8 mg/dL (ref 0.3–0.9)
Total Bilirubin: 1.4 mg/dL — ABNORMAL HIGH (ref 0.3–1.2)
Total Protein: 5.9 g/dL — ABNORMAL LOW (ref 6.5–8.1)

## 2020-07-26 LAB — CBC
HCT: 31.4 % — ABNORMAL LOW (ref 39.0–52.0)
Hemoglobin: 10.6 g/dL — ABNORMAL LOW (ref 13.0–17.0)
MCH: 32.6 pg (ref 26.0–34.0)
MCHC: 33.8 g/dL (ref 30.0–36.0)
MCV: 96.6 fL (ref 80.0–100.0)
Platelets: 143 10*3/uL — ABNORMAL LOW (ref 150–400)
RBC: 3.25 MIL/uL — ABNORMAL LOW (ref 4.22–5.81)
RDW: 13 % (ref 11.5–15.5)
WBC: 11.3 10*3/uL — ABNORMAL HIGH (ref 4.0–10.5)
nRBC: 0 % (ref 0.0–0.2)

## 2020-07-26 LAB — TRIGLYCERIDES: Triglycerides: 138 mg/dL (ref <150)

## 2020-07-26 LAB — PHOSPHORUS: Phosphorus: 2.5 mg/dL (ref 2.5–4.6)

## 2020-07-26 LAB — MAGNESIUM: Magnesium: 2 mg/dL (ref 1.7–2.4)

## 2020-07-26 MED ORDER — POLYVINYL ALCOHOL 1.4 % OP SOLN
1.0000 [drp] | Freq: Four times a day (QID) | OPHTHALMIC | Status: DC
  Administered 2020-07-26 – 2020-11-23 (×261): 1 [drp] via OPHTHALMIC
  Filled 2020-07-26 (×5): qty 15

## 2020-07-26 MED ORDER — PROPOFOL 1000 MG/100ML IV EMUL
5.0000 ug/kg/min | INTRAVENOUS | Status: DC
  Administered 2020-07-26: 50 ug/kg/min via INTRAVENOUS
  Administered 2020-07-26: 25 ug/kg/min via INTRAVENOUS
  Administered 2020-07-26 – 2020-07-28 (×10): 50 ug/kg/min via INTRAVENOUS
  Filled 2020-07-26 (×9): qty 100
  Filled 2020-07-26: qty 200
  Filled 2020-07-26: qty 100

## 2020-07-26 MED ORDER — AMLODIPINE BESYLATE 5 MG PO TABS
5.0000 mg | ORAL_TABLET | Freq: Every day | ORAL | Status: DC
  Administered 2020-07-26 – 2020-07-30 (×5): 5 mg
  Filled 2020-07-26 (×5): qty 1

## 2020-07-26 NOTE — Progress Notes (Addendum)
NAME:  Antonio Navarro, MRN:  [id number], DOB:  1960-10-12, LOS: 4 ADMISSION DATE:  07/22/2020, CONSULTATION DATE: 07/22/2020 REFERRING MD: Dr. Roxanne Mins, CHIEF COMPLAINT: Status post arrest  Brief History   Patient is 59 year old male status post arrest  History of present illness   Patient is a 59 year old male brought to the emergency room after out-of-hospital cardiac arrest.  His wife apparently noticed that while he was sleeping he had agonal breathing and called 911.  He was without resuscitative effort for about 9 minutes.  Patient was found to be apneic and pulls list with initial rhythm of ventricular fibrillation.  He was intubated with [facility] airway and received 6 cardioversion attempts.  He was loaded with amiodarone and got a total of 6 rounds of 1 mg epinephrine.  He had return of spontaneous circulation.  ER physician estimates that time of CPR efforts were 30 minutes.  King airway was replaced in the emergency room. At time of dictation patient is being taken to the cardiac Cath Lab.  On return of sinus rhythm the patient was found to have ST changes in the posterior and inferior leads.  Laboratory show an anion gap of 19 bicarb of 15 lactic acid is pending, glucose is 286, creatinine is 1.65 GFR estimated 45.  Past Medical History   . Hypertension   . Kidney stones      Significant Hospital Events   As above  Consults:  Cardiology, PCCM  Procedures:  As above  Significant Diagnostic Tests:  12/4 CT head>>no acute findings 12/5 Echo>>EF 15-40%, grade 1 diastolic dysfunction  Micro Data:  12/4 Covid and flu>>negative 12/4 MRSA>>neg 12/4 Resp culture>>  Antimicrobials:  Ceftriaxone 12/4-  Interim history/subjective:  No events, remains poorly responsive.  Objective   Blood pressure 128/78, pulse 67, temperature 98.2 F (36.8 C), temperature source Oral, resp. rate (!) 30, height 6' (1.829 m), weight 96.2 kg, SpO2 100 %.    Vent Mode: PRVC FiO2 (%):  [40 %]  40 % Set Rate:  [30 bmp] 30 bmp Vt Set:  [620 mL] 620 mL PEEP:  [5 cmH20] 5 cmH20 Plateau Pressure:  [18 cmH20-25 cmH20] 24 cmH20   Intake/Output Summary (Last 24 hours) at 07/26/2020 0867 Last data filed at 07/26/2020 0700 Gross per 24 hour  Intake 2339.77 ml  Output 2075 ml  Net 264.77 ml   Filed Weights   07/25/20 0000 07/25/20 0400 07/26/20 0100  Weight: 103.3 kg 103.3 kg 96.2 kg      Examination: Constitutional: unresponsive man on vent  Eyes: pupils equal, no tracking, no threat to confrontation Ears, nose, mouth, and throat: ETT in place, minimal secretions Cardiovascular: RRR, ext warm Respiratory: Scattered rhonci, no wheezing Gastrointestinal: Soft, +BS Skin: No rashes, normal turgor Neurologic: He has cough, gag, pupillary, oculocephalic, corneal reflexes.  GCS 3 otherwise. Psychiatric: Cannot assess   Renal function stable CBC stable  Resolved Hospital Problem list   NA  Assessment & Plan:  OOH Vfib arrest , Ischemic Cardiomyopathy- s/p PCI Severe anoxic encephalopathy  Acute hypoxemic respiratory failure on vent secondary to cardiac arrest and aspiration AKI- stable HTN- wean off CCB gtt  - Family meeting to discuss where to go from here, LTACH vs. Comfort vs. Giving more time, may need neuro to weigh in - Otherwise continue vent support, BP control, abx, TF, lab/vital monitoring etc.  Best practice (evaluated daily)   Diet: continue TF Pain/Anxiety/Delirium protocol (if indicated): off VAP protocol (if indicated): Yes DVT prophylaxis: heparin  GI prophylaxis: Yes Glucose control: Sliding scale insulin Mobility: Bedrest Family Communication: [place] meeting today Code Status: Full Disposition: ICU   Patient critically ill due to cardiac arrest, respiratory failure Interventions to address this today family meeting, ventilator titration Risk of deterioration without these interventions is high  I personally spent 34 minutes providing critical  care not including any separately billable procedures  Erskine Emery MD [facility] 07/26/2020 8:19 AM Personal pager: [phone] If unanswered, please page CCM On-call: [phone]

## 2020-07-26 NOTE — Consult Note (Signed)
Neurology Consultation Reason for Consult: Anoxic brain injury Referring Physician: Tamala Julian, D  CC: Decreased responsiveness  History is obtained from: Chart review  HPI: Antonio Navarro is a 59 y.o. male with history of hypertension who presented on 12/4 in the wee hours of the morning with V. fib arrest in the setting of STEMI.  He was started on cooling protocol and EEG was started which showed continuous background attenuation though this was in the setting of propofol.  Due to poor improvement, an MRI was obtained which demonstrates evidence of anoxic brain injury.  Neurology has been subsequently consulted for prognosis.    ROS:  Unable to obtain due to altered mental status.   Past Medical History:  Diagnosis Date  . Hypertension   . Kidney stones      Family History  Problem Relation Age of Onset  . Cancer Mother   . Hypertension Father      Social History:  reports that he has been smoking cigarettes. He has been smoking about 1.00 pack per day. He has never used smokeless tobacco. He reports current alcohol use. He reports current drug use. Drug: Marijuana.   Exam: Current vital signs: BP 133/84   Pulse 64   Temp 97.8 F (36.6 C) (Axillary)   Resp (!) 30   Ht 6' (1.829 m)   Wt 96.2 kg   SpO2 100%   BMI 28.76 kg/m  Vital signs in last 24 hours: Temp:  [97.1 F (36.2 C)-98.6 F (37 C)] 97.8 F (36.6 C) (12/08 1120) Pulse Rate:  [63-78] 64 (12/08 1120) Resp:  [0-30] 30 (12/08 1120) BP: (118-184)/(72-99) 133/84 (12/08 1120) SpO2:  [99 %-100 %] 100 % (12/08 1120) FiO2 (%):  [40 %] 40 % (12/08 1120) Weight:  [96.2 kg] 96.2 kg (12/08 0100)   Physical Exam  Constitutional: Appears well-developed and well-nourished.  Psych: Does not respond Eyes: No scleral injection HENT: Intubated MSK: no joint deformities.  Cardiovascular: Normal rate and regular rhythm.  Respiratory: Ventilated GI: Soft.  No distension. There is no tenderness.  Skin:  WDI  Neuro: Mental Status: Patient has eyes that are open some at baseline, further opens them in response to noxious stimulation Cranial Nerves: II: He does not blink to threat pupils are equal, round, and reactive to light.   III,IV, VI: Doll's eye maneuver is intact V: VII: Corneals are intact X: Uvula elevates symmetrically Motor: He has no response to noxious stimulation in the upper extremities bilaterally, he does have withdrawal to noxious stimulation in bilateral lower extremities Sensory: As above DTR: He has triple flexion response to plantar stimulation despite withdrawing to more proximal stimulation Cerebellar: Does not perform   I have reviewed labs in epic and the results pertinent to this consultation are: Creatinine 1.5  I have reviewed the images obtained: MRI-extensive cortical diffusion change in the posterior watershed and deep structures consistent with anoxic brain injury.  Though there was discussion of posterior reversible encephalopathy syndrome is a possibility etiology, the fact that the T2 changes in keeping with a diffusion change I think would argue strongly for anoxia as opposed to pres.  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  He does have extensive injury on MRI, but also has some cortically driven movements on exam.  I suspect that he is going to have significant disability in a best case scenario, but whether he has some capacity to return to consciousness is less clear.  I think that he  will likely have a "man in a barrel" type weakness that is described with watershed infarction.  Tomorrow will be 72 hours post rewarming.  I suspect that he will likely need long-term support if he would want to survive in a debilitated state, but there may be some possibility of return to consciousness over the long-term.  Recommendations: 1) continue observation for now, will continue conversations with family.   This patient is  critically ill and at significant risk of neurological worsening, death and care requires constant monitoring of vital signs, hemodynamics,respiratory and cardiac monitoring, neurological assessment, discussion with family, other specialists and medical decision making of high complexity. I spent 35 minutes of neurocritical care time  in the care of  this patient. This was time spent independent of any time provided by nurse practitioner or PA.  Roland Rack, MD [facility] [phone]  If 7pm- 7am, please page neurology on call as listed in [place]. 07/26/2020  2:50 PM

## 2020-07-26 NOTE — Progress Notes (Signed)
Progress Note  Patient Name: Antonio Navarro Date of Encounter: 07/26/2020  [facility] Cardiologist: Fransico Him, MD   Subjective   Intubated, sedated.   Inpatient Medications    Scheduled Meds: . acetaminophen (TYLENOL) oral liquid 160 mg/5 mL  650 mg Per Tube Q6H  . amLODipine  5 mg Per Tube Daily  . aspirin  81 mg Per Tube Daily  . atorvastatin  80 mg Per Tube Daily  . chlorhexidine gluconate (MEDLINE KIT)  15 mL Mouth Rinse BID  . Chlorhexidine Gluconate Cloth  6 each Topical Daily  . fentaNYL (SUBLIMAZE) injection  50 mcg Intravenous Once  . heparin  5,000 Units Subcutaneous Q8H  . insulin aspart  0-24 Units Subcutaneous Q4H  . mouth rinse  15 mL Mouth Rinse 10 times per day  . metoCLOPramide (REGLAN) injection  10 mg Intravenous Q6H  . metoprolol tartrate  25 mg Per Tube BID  . pantoprazole sodium  40 mg Per Tube QHS  . polyvinyl alcohol  1 drop Both Eyes QID  . sodium chloride flush  3 mL Intravenous Q12H  . ticagrelor  90 mg Per Tube BID   Continuous Infusions: . sodium chloride    . sodium chloride Stopped (07/23/20 0948)  . sodium chloride Stopped (07/22/20 0955)  . clevidipine 1 mg/hr (07/26/20 0700)  . dexmedetomidine (PRECEDEX) IV infusion 1 mcg/kg/hr (07/26/20 0700)  . feeding supplement (VITAL HIGH PROTEIN) 1,000 mL (07/26/20 0610)  . fentaNYL infusion INTRAVENOUS 150 mcg/hr (07/26/20 0700)  . midazolam Stopped (07/25/20 0453)  . norepinephrine (LEVOPHED) Adult infusion Stopped (07/22/20 1023)  . propofol (DIPRIVAN) infusion Stopped (07/24/20 1357)   PRN Meds: sodium chloride, acetaminophen, fentaNYL, metoprolol tartrate, midazolam, ondansetron (ZOFRAN) IV, polyethylene glycol, sodium chloride flush   Vital Signs    Vitals:   07/26/20 0600 07/26/20 0630 07/26/20 0700 07/26/20 0753  BP: 125/82 134/83 128/78 130/79  Pulse: 69 71 67 69  Resp: (!) 30 (!) 30 (!) 30 (!) 30  Temp:   98.5 F (36.9 C)   TempSrc:   Oral   SpO2: 100% 100% 100% 100%   Weight:      Height:        Intake/Output Summary (Last 24 hours) at 07/26/2020 0856 Last data filed at 07/26/2020 0700 Gross per 24 hour  Intake 2180.18 ml  Output 2075 ml  Net 105.18 ml   Last 3 Weights 07/26/2020 07/25/2020 07/25/2020  Weight (lbs) 212 lb 1.3 oz 227 lb 11.8 oz 227 lb 11.8 oz  Weight (kg) 96.2 kg 103.3 kg 103.3 kg      Telemetry     Sinus - Personally Reviewed  ECG    No AM EKG - Personally Reviewed  Physical Exam   General: Intubated, sedated.  SKIN: warm, dry.  Neuro: Unable to assess.  Musculoskeletal: Muscle strength 5/5 all ext  Psychiatric: sedated Neck: No JVD Lungs:Clear bilaterally, no wheezes, rhonci, crackles Cardiovascular: Regular rate and rhythm. No murmurs, gallops or rubs. Abdomen:Soft. Bowel sounds present.  Extremities: No lower extremity edema.  Labs    High Sensitivity Troponin:   Recent Labs  Lab 07/22/20 0418 07/22/20 1220  TROPONINIHS 818* 24,533*      Chemistry Recent Labs  Lab 07/24/20 0425 07/24/20 0425 07/24/20 1734 07/25/20 0343 07/26/20 0526  NA 138  --   --  139 140  K 3.2*   < > 4.2 3.7 3.5  CL 108  --   --  110 110  CO2 20*  --   --  19* 18*  GLUCOSE 140*  --   --  191* 171*  BUN 27*  --   --  28* 30*  CREATININE 1.74*  --   --  1.56* 1.51*  CALCIUM 8.1*  --   --  8.2* 8.4*  PROT 5.5*  --   --  5.7* 5.9*  ALBUMIN 2.5*  --   --  2.4* 2.3*  AST 93*  --   --  71* 66*  ALT 64*  --   --  47* 43  ALKPHOS 53  --   --  63 105  BILITOT 1.3*  --   --  1.4* 1.4*  GFRNONAA 45*  --   --  51* 53*  ANIONGAP 10  --   --  10 12   < > = values in this interval not displayed.     Hematology Recent Labs  Lab 07/24/20 0425 07/25/20 0343 07/26/20 0526  WBC 11.6* 10.1 11.3*  RBC 3.80* 3.33* 3.25*  HGB 12.6* 11.2* 10.6*  HCT 37.0* 32.7* 31.4*  MCV 97.4 98.2 96.6  MCH 33.2 33.6 32.6  MCHC 34.1 34.3 33.8  RDW 13.2 13.1 13.0  PLT 132* 131* 143*    BNPNo results for input(s): BNP, PROBNP in the last 168  hours.   DDimer No results for input(s): DDIMER in the last 168 hours.   Radiology    MR BRAIN WO CONTRAST  Addendum Date: 07/25/2020   ADDENDUM REPORT: 07/25/2020 19:21 ADDENDUM: Study discussed by telephone with Critical Care Dr. Ina Homes on 07/25/2020 at 1902 hours. I advised that I feel Neurology consultation would be valuable. Electronically Signed   By: Genevie Ann M.D.   On: 07/25/2020 19:21   Result Date: 07/25/2020 CLINICAL DATA:  59 year old male with worsening encephalopathy status post cardiac arrest. EXAM: MRI HEAD WITHOUT CONTRAST TECHNIQUE: Multiplanar, multiecho pulse sequences of the brain and surrounding structures were obtained without intravenous contrast. COMPARISON:  Head CT 07/22/2020. FINDINGS: Brain: Widespread abnormal cortical signal abnormality with a posterior circulation and superior bifrontal predominance. The appearance consists of intense abnormal trace diffusion (series 2, image 25), with isointense ADC, associated T2 and FLAIR hyperintensity. Similar scattered subcortical white matter involvement also in both hemispheres. There is more subtle abnormal signal in the bilateral thalami (series 6 image 14). The basal ganglia appear within normal limits. The brainstem is spared. Occasional small areas of restricted diffusion in the cerebellum (series 2, image 9). No superimposed acute intracranial hemorrhage, mass effect, or ventriculomegaly. No evidence of mass lesion, extra-axial collection. Cervicomedullary junction and pituitary are within normal limits. Vascular: Major intracranial vascular flow voids are preserved. Skull and upper cervical spine: Negative. Visualized bone marrow signal is within normal limits. Sinuses/Orbits: Negative orbits. Intubated with fluid and mucosal thickening in the bilateral paranasal sinuses and visible pharynx. Other: Mild bilateral mastoid effusions. IMPRESSION: 1. Fairly symmetric abnormal signal is widespread in the hemispheric cortex and  subcortical white matter with a posterior circulation predominance. Although posterior reversible encephalopathy syndrome (PRES) can have a similar appearance, the lack of facilitated diffusion in this case favors widespread Anoxic Injury. Bithalamic involvement also suspected. Trace cerebellar involvement. 2. No associated hemorrhage or mass effect. Electronically Signed: By: Genevie Ann M.D. On: 07/25/2020 17:58    Cardiac Studies   Echo 07/23/20:   1. Left ventricular ejection fraction, by estimation, is 40 to 45%. The  left ventricle has mildly decreased function. Left ventricular endocardial  border not optimally defined to evaluate regional wall motion, but  regional wall motion abnormalities are  present. There is mild left ventricular hypertrophy. Left ventricular  diastolic parameters are consistent with Grade I diastolic dysfunction  (impaired relaxation).  2. Right ventricular systolic function is mildly reduced. The right  ventricular size is normal. Tricuspid regurgitation signal is inadequate  for assessing PA pressure.  3. The aortic valve is tricuspid. Aortic valve regurgitation is not  visualized. No aortic stenosis is present.  4. The mitral valve is grossly normal. No evidence of mitral valve  regurgitation. No evidence of mitral stenosis.  5. The inferior vena cava is normal in size with <50% respiratory  variability, suggesting right atrial pressure of 8 mmHg.   Patient Profile     59 y.o. male with history of tobacco abuse, obesity, HTN who was admitted 07/22/20 post cardiac arrest with inferolateral STEMI. He was resuscitated in the field by EMS after being found to be in V fib. He was intubated on arrival. Cardiac cath with 95% Circumflex stenosis treated with a drug eluting stent. LvEF=40-45% by echo 07/23/20.    Assessment & Plan    1. Cardiac arrest secondary to acute MI: s/p stenting of the Circumflex. He has been cooled. He is hemodynamically stable but  remains unresponsive due to hypoxic encephalopathy. Continue current medical therapy for now including ASA, Brilinta, beta blocker and statin.    2. Ischemic cardiomyopathy: LvEF=40-45%. Continue beta blocker.    3. Hypoxic respiratory failure/Anoxic encephalopathy: PCCM and Neurology following.   Poor prognosis from neuro standpoint. No new cardiac recommendations today.   For questions or updates, please contact [place] Please consult [email or web address] for contact info under      Signed, Lauree Chandler, MD  07/26/2020, 8:56 AM

## 2020-07-27 ENCOUNTER — Inpatient Hospital Stay (HOSPITAL_COMMUNITY): Payer: Medicaid Other

## 2020-07-27 DIAGNOSIS — G931 Anoxic brain damage, not elsewhere classified: Secondary | ICD-10-CM

## 2020-07-27 DIAGNOSIS — Z515 Encounter for palliative care: Secondary | ICD-10-CM

## 2020-07-27 DIAGNOSIS — Z7189 Other specified counseling: Secondary | ICD-10-CM

## 2020-07-27 LAB — GLUCOSE, CAPILLARY
Glucose-Capillary: 128 mg/dL — ABNORMAL HIGH (ref 70–99)
Glucose-Capillary: 146 mg/dL — ABNORMAL HIGH (ref 70–99)
Glucose-Capillary: 151 mg/dL — ABNORMAL HIGH (ref 70–99)
Glucose-Capillary: 164 mg/dL — ABNORMAL HIGH (ref 70–99)
Glucose-Capillary: 170 mg/dL — ABNORMAL HIGH (ref 70–99)
Glucose-Capillary: 173 mg/dL — ABNORMAL HIGH (ref 70–99)
Glucose-Capillary: 189 mg/dL — ABNORMAL HIGH (ref 70–99)

## 2020-07-27 LAB — CBC
HCT: 32.7 % — ABNORMAL LOW (ref 39.0–52.0)
Hemoglobin: 11.2 g/dL — ABNORMAL LOW (ref 13.0–17.0)
MCH: 33.4 pg (ref 26.0–34.0)
MCHC: 34.3 g/dL (ref 30.0–36.0)
MCV: 97.6 fL (ref 80.0–100.0)
Platelets: 190 10*3/uL (ref 150–400)
RBC: 3.35 MIL/uL — ABNORMAL LOW (ref 4.22–5.81)
RDW: 13.3 % (ref 11.5–15.5)
WBC: 11.9 10*3/uL — ABNORMAL HIGH (ref 4.0–10.5)
nRBC: 0 % (ref 0.0–0.2)

## 2020-07-27 LAB — HEPATIC FUNCTION PANEL
ALT: 52 U/L — ABNORMAL HIGH (ref 0–44)
AST: 73 U/L — ABNORMAL HIGH (ref 15–41)
Albumin: 2.6 g/dL — ABNORMAL LOW (ref 3.5–5.0)
Alkaline Phosphatase: 161 U/L — ABNORMAL HIGH (ref 38–126)
Bilirubin, Direct: 0.6 mg/dL — ABNORMAL HIGH (ref 0.0–0.2)
Indirect Bilirubin: 1.1 mg/dL — ABNORMAL HIGH (ref 0.3–0.9)
Total Bilirubin: 1.7 mg/dL — ABNORMAL HIGH (ref 0.3–1.2)
Total Protein: 6.6 g/dL (ref 6.5–8.1)

## 2020-07-27 LAB — BASIC METABOLIC PANEL
Anion gap: 12 (ref 5–15)
BUN: 36 mg/dL — ABNORMAL HIGH (ref 6–20)
CO2: 21 mmol/L — ABNORMAL LOW (ref 22–32)
Calcium: 8.8 mg/dL — ABNORMAL LOW (ref 8.9–10.3)
Chloride: 111 mmol/L (ref 98–111)
Creatinine, Ser: 1.61 mg/dL — ABNORMAL HIGH (ref 0.61–1.24)
GFR, Estimated: 49 mL/min — ABNORMAL LOW (ref >60)
Glucose, Bld: 172 mg/dL — ABNORMAL HIGH (ref 70–99)
Potassium: 3.3 mmol/L — ABNORMAL LOW (ref 3.5–5.1)
Sodium: 144 mmol/L (ref 135–145)

## 2020-07-27 LAB — PHOSPHORUS: Phosphorus: 1.9 mg/dL — ABNORMAL LOW (ref 2.5–4.6)

## 2020-07-27 LAB — CULTURE, RESPIRATORY W GRAM STAIN

## 2020-07-27 LAB — MAGNESIUM: Magnesium: 2 mg/dL (ref 1.7–2.4)

## 2020-07-27 MED ORDER — CARVEDILOL 6.25 MG PO TABS
6.2500 mg | ORAL_TABLET | Freq: Two times a day (BID) | ORAL | Status: DC
  Administered 2020-07-27 – 2020-07-28 (×2): 6.25 mg
  Filled 2020-07-27 (×2): qty 1

## 2020-07-27 MED ORDER — POTASSIUM CHLORIDE 20 MEQ PO PACK
40.0000 meq | PACK | Freq: Once | ORAL | Status: AC
  Administered 2020-07-27: 40 meq
  Filled 2020-07-27: qty 2

## 2020-07-27 MED ORDER — CHLORPROMAZINE HCL 25 MG PO TABS
25.0000 mg | ORAL_TABLET | Freq: Three times a day (TID) | ORAL | Status: DC | PRN
  Administered 2020-07-28 – 2020-07-30 (×4): 25 mg
  Filled 2020-07-27 (×5): qty 1

## 2020-07-27 MED ORDER — HYDRALAZINE HCL 25 MG PO TABS
25.0000 mg | ORAL_TABLET | Freq: Three times a day (TID) | ORAL | Status: DC
  Administered 2020-07-27: 25 mg via ORAL
  Filled 2020-07-27: qty 1

## 2020-07-27 MED ORDER — HYDRALAZINE HCL 25 MG PO TABS
25.0000 mg | ORAL_TABLET | Freq: Three times a day (TID) | ORAL | Status: DC
  Administered 2020-07-27 – 2020-07-30 (×9): 25 mg
  Filled 2020-07-27 (×9): qty 1

## 2020-07-27 MED ORDER — CARVEDILOL 6.25 MG PO TABS
6.2500 mg | ORAL_TABLET | Freq: Two times a day (BID) | ORAL | Status: DC
  Administered 2020-07-27: 6.25 mg via ORAL
  Filled 2020-07-27: qty 1

## 2020-07-27 MED ORDER — FREE WATER
200.0000 mL | Status: DC
  Administered 2020-07-27 – 2020-08-02 (×30): 200 mL

## 2020-07-27 MED ORDER — CHLORPROMAZINE HCL 25 MG PO TABS
25.0000 mg | ORAL_TABLET | Freq: Three times a day (TID) | ORAL | Status: DC | PRN
  Administered 2020-07-27: 25 mg via ORAL
  Filled 2020-07-27 (×2): qty 1

## 2020-07-27 MED ORDER — POTASSIUM CHLORIDE 10 MEQ/100ML IV SOLN
10.0000 meq | INTRAVENOUS | Status: AC
  Administered 2020-07-27 (×4): 10 meq via INTRAVENOUS
  Filled 2020-07-27 (×4): qty 100

## 2020-07-27 NOTE — Progress Notes (Signed)
Pharmacy note: electrolyte replacement  -K= 3.3 -SCr= 1.6; UOP ~ I ml/kg/hr -mg= 2.0  Plan -K 40 po and 40 IV for total of 79meq  Hildred Laser, PharmD Clinical Pharmacist **Pharmacist phone directory can now be found on [email or web address] ([email or web address]).  Listed under [place].

## 2020-07-27 NOTE — Procedures (Signed)
Patient Name: Antonio Navarro  MRN: [id number]  Epilepsy Attending: Lora Havens  Referring Physician/Provider: Dr Ina Homes Date: 07/27/2020 Duration: 25.36 mins  Patient history: 59yo M s/p cardiac arrest. EEG to evaluate for seizure  Level of alertness: comatose  AEDs during EEG study: Propofol  Technical aspects: This EEG study was done with scalp electrodes positioned according to the 10-20 International system of electrode placement. Electrical activity was acquired at a sampling rate of 500Hz  and reviewed with a high frequency filter of 70Hz  and a low frequency filter of 1Hz . EEG data were recorded continuously and digitally stored.   Description: EEG showed continuous generalized 6-9Hz  theta-alpha activity admixed with intermittent generalized 2-3Hz  delta slowing.   ABNORMALITY -Continuous slow, generalized  IMPRESSION: This study issuggestive ofseverediffuse encephalopathy, nonspecific etiology but likely related to sedation, anoxic-hypoxic injury.No seizures or epileptiform discharges were seen throughout the recording.  EEG appears to be similar to previous eeg on 07/24/2020.  Antonio Navarro

## 2020-07-27 NOTE — Consult Note (Addendum)
Consultation Note Date: 07/27/2020   Patient Name: Antonio Navarro  DOB: 30-Jan-1961  MRN: [id number]  Age / Sex: 59 y.o., male  PCP: Patient, No Pcp Per Referring Physician: Candee Furbish, MD  Reason for Consultation: Establishing goals of care and Psychosocial/spiritual support  HPI/Patient Profile: 59 y.o. male   admitted on 07/22/2020 with PMH for hypertension who presented on 12/4 with V. fib arrest in the setting of STEMI.    Patient currently critically ill in the ICU, he remains intubated.  Severediffuse encephalopathy  Severe ischemic cardiomyopathy, severe anoxic encephalopathy.  Improving acute kidney injury, improving hypernatremia.  Long term prognosis is guarded  Patient and his family face treatment option decisions, advanced directive decisions, and anticipatory care needs. (LTAC versus comfort care allowing for a natural death.)   Clinical Assessment and Goals of Care:   This NP Wadie Lessen reviewed medical records, received report from team, assessed the patient and then meet at the patient's bedside  to discuss diagnosis, prognosis, GOC, EOL wishes disposition and options.  A goals of care meeting was had with patient's fianc Loralee Pacas, sister Nikholas Geffre and sister Ellan Lambert.   It has been determined that the patient does not have any documented healthcare power of attorney or advanced directive.  He does have an adult Programmer, multimedia # #  [phone].  We were able to get son on the phone to participate in today's conversation.   Concept of Palliative Care was introduced as specialized medical care for people and their families living with serious illness.  If focuses on providing relief from the symptoms and stress of a serious illness.  The goal is to improve quality of life for both the patient and the family.  Dr. Selinda Flavin Patrick/neurology came to the bedside and spoke to  the family regarding the seriousness of the current medical situation and his concern for long term significant debilities, meaningful recovery.  Created space and opportunity for family to explore thoughts and feelings regarding current medical information.   Values and goals of care important to patient and family were attempted to be elicited.  All family members verbalize their love for Mr. Heath and their hope for improvement.  Today's conversation allowed for family members process and recognize the seriousness of his anoxic brain injury and the likely possibility that he will not return to baseline and possibly to any meaningful level of interaction with his environment.  At this time family is open to all offered and available medical interventions to prolong life.  They wish to give Mr Stegman more time to show signs of recovery and improvement before making any decision regarding de-escalation of care.  Education regarding possibility of need for trach and PEG   Family had lots of questions regarding facilities that could possibly care for [name] into the future as they give him more time to show signs of recovery.   However I encouraged them that there are many steps and decisions to be made before decisions of transition of  care are had. Take it one step at a time.   The difference between a aggressive medical intervention path  and a palliative comfort care path for this patient at this time was had.  I shared with the family that without the current life support measures [name] prognosis would likely be hours to days.   Hard Choices booklet left for review.      Questions and concerns addressed.  Patient  encouraged to call with questions or concerns.     PMT will continue to support holistically.         As mentioned above there is no document dated healthcare power of attorney or advanced directive.  From a legal standpoint I explained to the family that the patient's  adult son would be the legal decision maker at this point in time.  However I reinforced the importance of all family members continuing conversation and making decisions as a unit for the patient's best interest.      SUMMARY OF RECOMMENDATIONS    Follow-up family meeting is scheduled for Saturday, 07/29/2020 at 2 PM with PMT provider Tacey Ruiz NP  [place] desk notified of need for four visitors at that time  Code Status/Advance Care Planning:  Full code   Palliative Prophylaxis:   Aspiration, Bowel Regimen, Delirium Protocol, Frequent Pain Assessment and Oral Care  Additional Recommendations (Limitations, Scope, Preferences):  Full Scope Treatment  Psycho-social/Spiritual:   Desire for further Chaplaincy support:yes  Additional Recommendations: Education on Hospice  Prognosis:   Unable to determine  Discharge Planning: To Be Determined      Primary Diagnoses: Present on Admission: **None**   I have reviewed the medical record, interviewed the patient and family, and examined the patient. The following aspects are pertinent.  Past Medical History:  Diagnosis Date  . Hypertension   . Kidney stones    Social History   Socioeconomic History  . Marital status: Single    Spouse name: Not on file  . Number of children: Not on file  . Years of education: Not on file  . Highest education level: Not on file  Occupational History  . Not on file  Tobacco Use  . Smoking status: Current Every Day Smoker    Packs/day: 1.00    Types: Cigarettes  . Smokeless tobacco: Never Used  Vaping Use  . Vaping Use: Never used  Substance and Sexual Activity  . Alcohol use: Yes    Comment: occasional  . Drug use: Yes    Types: Marijuana    Comment: occ  . Sexual activity: Not on file  Other Topics Concern  . Not on file  Social History Narrative  . Not on file   Social Determinants of Health   Financial Resource Strain: Not on file  Food Insecurity: Not on  file  Transportation Needs: Not on file  Physical Activity: Not on file  Stress: Not on file  Social Connections: Not on file   Family History  Problem Relation Age of Onset  . Cancer Mother   . Hypertension Father    Scheduled Meds: . acetaminophen (TYLENOL) oral liquid 160 mg/5 mL  650 mg Per Tube Q6H  . amLODipine  5 mg Per Tube Daily  . aspirin  81 mg Per Tube Daily  . atorvastatin  80 mg Per Tube Daily  . chlorhexidine gluconate (MEDLINE KIT)  15 mL Mouth Rinse BID  . Chlorhexidine Gluconate Cloth  6 each Topical Daily  . heparin  5,000 Units  Subcutaneous Q8H  . insulin aspart  0-24 Units Subcutaneous Q4H  . mouth rinse  15 mL Mouth Rinse 10 times per day  . metoCLOPramide (REGLAN) injection  10 mg Intravenous Q6H  . metoprolol tartrate  25 mg Per Tube BID  . pantoprazole sodium  40 mg Per Tube QHS  . polyvinyl alcohol  1 drop Both Eyes QID  . sodium chloride flush  3 mL Intravenous Q12H  . ticagrelor  90 mg Per Tube BID   Continuous Infusions: . sodium chloride    . sodium chloride Stopped (07/23/20 0948)  . sodium chloride Stopped (07/22/20 0955)  . clevidipine 3 mg/hr (07/27/20 0700)  . feeding supplement (VITAL HIGH PROTEIN) 65 mL/hr at 07/27/20 0700  . norepinephrine (LEVOPHED) Adult infusion Stopped (07/22/20 1023)  . propofol (DIPRIVAN) infusion 50 mcg/kg/min (07/27/20 0700)   PRN Meds:.sodium chloride, acetaminophen, metoprolol tartrate, ondansetron (ZOFRAN) IV, polyethylene glycol, sodium chloride flush Medications Prior to Admission:  Prior to Admission medications   Medication Sig Start Date End Date Taking? Authorizing Provider  amLODipine (NORVASC) 10 MG tablet Take 1 tablet (10 mg total) by mouth daily. 05/15/20  Yes Burky, Lanelle Bal B, NP  cyclobenzaprine (FLEXERIL) 5 MG tablet Take 1-2 tablets (5-10 mg total) by mouth at bedtime. 05/15/20  Yes Burky, Lanelle Bal B, NP  gabapentin (NEURONTIN) 300 MG capsule Take 1 capsule (300 mg total) by mouth 3 (three) times  daily. 05/22/20  Yes Jaynee Eagles, PA-C  tiZANidine (ZANAFLEX) 4 MG tablet Take 1 tablet (4 mg total) by mouth every 8 (eight) hours as needed. 05/22/20  Yes Jaynee Eagles, PA-C  traMADol (ULTRAM) 50 MG tablet Take 1 tablet (50 mg total) by mouth every 6 (six) hours as needed for severe pain. 05/22/20  Yes Jaynee Eagles, PA-C  aspirin-acetaminophen-caffeine (EXCEDRIN MIGRAINE) [phone] MG per tablet Take 1 tablet by mouth every 6 (six) hours as needed (toothache). Patient not taking: Reported on 07/22/2020    [provider]  lidocaine (LIDODERM) 5 % Place 1 patch onto the skin daily. Remove & Discard patch within 12 hours or as directed by MD Patient not taking: Reported on 07/22/2020 06/02/20   Alfredia Client, PA-C  predniSONE (STERAPRED UNI-PAK 21 TAB) 10 MG (21) TBPK tablet Take by mouth daily. Per box instruction Patient not taking: Reported on 07/22/2020 05/15/20   Zigmund Gottron, NP   No Known Allergies Review of Systems  Unable to perform ROS: Acuity of condition    Physical Exam Constitutional:      Appearance: He is overweight. He is ill-appearing.     Interventions: He is intubated.  Cardiovascular:     Rate and Rhythm: Normal rate.  Pulmonary:     Effort: He is intubated.  Skin:    General: Skin is warm and dry.  Neurological:     Cranial Nerves: Cranial nerve deficit present.     Vital Signs: BP (!) 153/91   Pulse 89   Temp 98.5 F (36.9 C) (Oral)   Resp (!) 30   Ht 6' (1.829 m)   Wt 94.8 kg   SpO2 100%   BMI 28.34 kg/m  Pain Scale: CPOT   Pain Score: Asleep   SpO2: SpO2: 100 % O2 Device:SpO2: 100 % O2 Flow Rate: .   IO: Intake/output summary:   Intake/Output Summary (Last 24 hours) at 07/27/2020 0747 Last data filed at 07/27/2020 0700 Gross per 24 hour  Intake 2458.24 ml  Output 3900 ml  Net -1441.76 ml    LBM: Last  BM Date: 07/21/20 Baseline Weight: Weight: 102 kg Most recent weight: Weight: 94.8 kg     Palliative Assessment/Data:      Discussed with bedside RN and Dr Leonel Ramsay  Time In: 0900 Time Out: 1015 Time Total: 75 minutes Greater than 50%  of this time was spent counseling and coordinating care related to the above assessment and plan.  Signed by: Wadie Lessen, NP   Please contact Palliative Medicine Team phone at [phone] for questions and concerns.  For individual provider: See Shea Evans

## 2020-07-27 NOTE — Progress Notes (Signed)
Cardiology Rounding Note:  No changes overnight. Likely anoxic brain injury.  We will follow along.  Please call if there are any cardiac questions.   Lauree Chandler 07/27/2020 7:36 AM

## 2020-07-27 NOTE — Progress Notes (Signed)
NAME:  Antonio Navarro, MRN:  [id number], DOB:  28-Nov-1960, LOS: 5 ADMISSION DATE:  07/22/2020, CONSULTATION DATE: 07/22/2020 REFERRING MD: Dr. Roxanne Mins, CHIEF COMPLAINT: Status post arrest  Brief History   Patient is 59 year old male status post arrest  History of present illness   Patient is a 59 year old male brought to the emergency room after out-of-hospital cardiac arrest.  His wife apparently noticed that while he was sleeping he had agonal breathing and called 911.  He was without resuscitative effort for about 9 minutes.  Patient was found to be apneic and pulls list with initial rhythm of ventricular fibrillation.  He was intubated with [facility] airway and received 6 cardioversion attempts.  He was loaded with amiodarone and got a total of 6 rounds of 1 mg epinephrine.  He had return of spontaneous circulation.  ER physician estimates that time of CPR efforts were 30 minutes.  King airway was replaced in the emergency room. At time of dictation patient is being taken to the cardiac Cath Lab.  On return of sinus rhythm the patient was found to have ST changes in the posterior and inferior leads.  Laboratory show an anion gap of 19 bicarb of 15 lactic acid is pending, glucose is 286, creatinine is 1.65 GFR estimated 45.  Past Medical History   . Hypertension   . Kidney stones      Significant Hospital Events   As above  Consults:  Cardiology, PCCM  Procedures:  As above  Significant Diagnostic Tests:  12/4 CT head>>no acute findings 12/5 Echo>>EF 22-33%, grade 1 diastolic dysfunction  Micro Data:  12/4 Covid and flu>>negative 12/4 MRSA>>neg 12/4 Resp culture>>  Antimicrobials:  Ceftriaxone 12/4-  Interim history/subjective:  No events, remains nearly comatose on vent. On prop and cleviprex  Objective   Blood pressure (!) 153/91, pulse 89, temperature 98.5 F (36.9 C), temperature source Oral, resp. rate (!) 30, height 6' (1.829 m), weight 94.8 kg, SpO2 100 %.     Vent Mode: PRVC FiO2 (%):  [40 %] 40 % Set Rate:  [30 bmp] 30 bmp Vt Set:  [612 mL] 620 mL PEEP:  [5 cmH20] 5 cmH20 Plateau Pressure:  [21 cmH20-25 cmH20] 25 cmH20   Intake/Output Summary (Last 24 hours) at 07/27/2020 0755 Last data filed at 07/27/2020 0700 Gross per 24 hour  Intake 2458.24 ml  Output 3900 ml  Net -1441.76 ml   Filed Weights   07/25/20 0400 07/26/20 0100 07/27/20 0500  Weight: 103.3 kg 96.2 kg 94.8 kg      Examination: Constitutional: no acute distress  Eyes: not tracking, pupils equal Ears, nose, mouth, and throat: ETT in place, small thick secretions Cardiovascular: RRR, ext warm Respiratory: Diminished at bases, triggering vent Gastrointestinal: hypoactive BS, slightly distended Skin: No rashes, normal turgor Neurologic: opens eyes to voice, does not track, brainstem reflexes intact, GCS 3, occasional nonpurposeful flexion of hips noted yesterday Psychiatric: cannot asses  CBC/BMP/CBG stable  Resolved Hospital Problem list   NA  Assessment & Plan:  OOH Vfib arrest , Ischemic Cardiomyopathy- s/p PCI w/ DES placement Severe anoxic encephalopathy  Acute hypoxemic respiratory failure on vent secondary to cardiac arrest and aspiration AKI- stable HTN- worse with sedation wean Constipation Worsening hypernatremia- no signs of DI at present  - Start PO antihypertensive regimen, wean off cleviprex - Recheck spot EEG r/o subclinical status - Antiplatelet agents as ordered - Continue vent support, mental status precludes extubation - Start stronger bowel regimen - Start FWF -  Appreciate neuro input, very guarded prognosis but some cortical signs on exam - Will need to have LTACH vs. Comfort care decision at some point  Best practice (evaluated daily)   Diet: continue TF Pain/Anxiety/Delirium protocol (if indicated): wean to off VAP protocol (if indicated): Yes DVT prophylaxis: heparin GI prophylaxis: Yes Glucose control: Sliding scale  insulin Mobility: Bedrest Family Communication: daily Code Status: Full Disposition: ICU   Patient critically ill due to cardiac arrest, respiratory failure Interventions to address this today family meeting, ventilator titration Risk of deterioration without these interventions is high  I personally spent 36 minutes providing critical care not including any separately billable procedures  Erskine Emery MD [facility] 07/27/2020 7:55 AM Personal pager: [phone] If unanswered, please page CCM On-call: [phone]

## 2020-07-27 NOTE — Progress Notes (Signed)
Subjective: No significant changes  Exam: Vitals:   07/27/20 0800 07/27/20 0900  BP: 128/82 (!) 174/97  Pulse: 88 100  Resp: (!) 28 (!) 30  Temp:    SpO2: 98% 97%   Gen: In bed, intubated Resp: Ventilated Abd: soft, nt  Neuro: MS: Eyes are open, open further with noxious stimulation, but the patient does not fixate or engage the examiner.  He does not follow commands. CN: Pupils are equal round and reactive, doll's eye maneuver intact, corneals intact Motor: He has no movement to noxious stimulation in the upper extremities, he has very clear withdrawal of bilateral lower extremities, if I pinch him on the lateral aspect of his thigh he adducts, and if I pinch him on the medial aspect of his thigh he abducts bilaterally. Sensory: Response to noxious stimulation in bilateral lower extremities, no response in his upper extremities DTR: Upgoing toes bilaterally  Pertinent Labs: Creatinine 1.6  Impression: 59 yo M with significant anoxic brain injury. Though his MRI reveals significant bilateral posterior cortical injury, I am not sure that this precludes a possibility of return of conciousness. With his man-in-a-barrel weakness, I think even in a best case scenario, he is going to have significant disability, but he may have some possibility of a return of consciousness, though this is not definite. .   Recommendations: 1) Will continue to discuss with family what his wishes would be.   Roland Rack, MD [facility] [phone]  If 7pm- 7am, please page neurology on call as listed in [place].

## 2020-07-27 NOTE — Progress Notes (Signed)
STAT EEG complete - results pending. ? ?

## 2020-07-28 ENCOUNTER — Inpatient Hospital Stay (HOSPITAL_COMMUNITY): Payer: Medicaid Other

## 2020-07-28 ENCOUNTER — Other Ambulatory Visit: Payer: Self-pay

## 2020-07-28 LAB — CBC
HCT: 28.7 % — ABNORMAL LOW (ref 39.0–52.0)
Hemoglobin: 9.2 g/dL — ABNORMAL LOW (ref 13.0–17.0)
MCH: 32.5 pg (ref 26.0–34.0)
MCHC: 32.1 g/dL (ref 30.0–36.0)
MCV: 101.4 fL — ABNORMAL HIGH (ref 80.0–100.0)
Platelets: 206 10*3/uL (ref 150–400)
RBC: 2.83 MIL/uL — ABNORMAL LOW (ref 4.22–5.81)
RDW: 13.5 % (ref 11.5–15.5)
WBC: 10.8 10*3/uL — ABNORMAL HIGH (ref 4.0–10.5)
nRBC: 0 % (ref 0.0–0.2)

## 2020-07-28 LAB — BASIC METABOLIC PANEL
Anion gap: 10 (ref 5–15)
BUN: 37 mg/dL — ABNORMAL HIGH (ref 6–20)
CO2: 21 mmol/L — ABNORMAL LOW (ref 22–32)
Calcium: 8.2 mg/dL — ABNORMAL LOW (ref 8.9–10.3)
Chloride: 112 mmol/L — ABNORMAL HIGH (ref 98–111)
Creatinine, Ser: 1.66 mg/dL — ABNORMAL HIGH (ref 0.61–1.24)
GFR, Estimated: 47 mL/min — ABNORMAL LOW (ref >60)
Glucose, Bld: 198 mg/dL — ABNORMAL HIGH (ref 70–99)
Potassium: 3.8 mmol/L (ref 3.5–5.1)
Sodium: 143 mmol/L (ref 135–145)

## 2020-07-28 LAB — GLUCOSE, CAPILLARY
Glucose-Capillary: 166 mg/dL — ABNORMAL HIGH (ref 70–99)
Glucose-Capillary: 186 mg/dL — ABNORMAL HIGH (ref 70–99)
Glucose-Capillary: 243 mg/dL — ABNORMAL HIGH (ref 70–99)
Glucose-Capillary: 252 mg/dL — ABNORMAL HIGH (ref 70–99)
Glucose-Capillary: 267 mg/dL — ABNORMAL HIGH (ref 70–99)
Glucose-Capillary: 277 mg/dL — ABNORMAL HIGH (ref 70–99)

## 2020-07-28 LAB — PHOSPHORUS: Phosphorus: 3.2 mg/dL (ref 2.5–4.6)

## 2020-07-28 LAB — MAGNESIUM: Magnesium: 2 mg/dL (ref 1.7–2.4)

## 2020-07-28 IMAGING — DX DG CHEST 1V PORT
1 series · 1 of 1 positions shown · non-contrast
Comparison: [DATE].

CLINICAL DATA: CPR.  Cardiac arrest.

EXAM:
PORTABLE CHEST 1 VIEW

[chest ap]
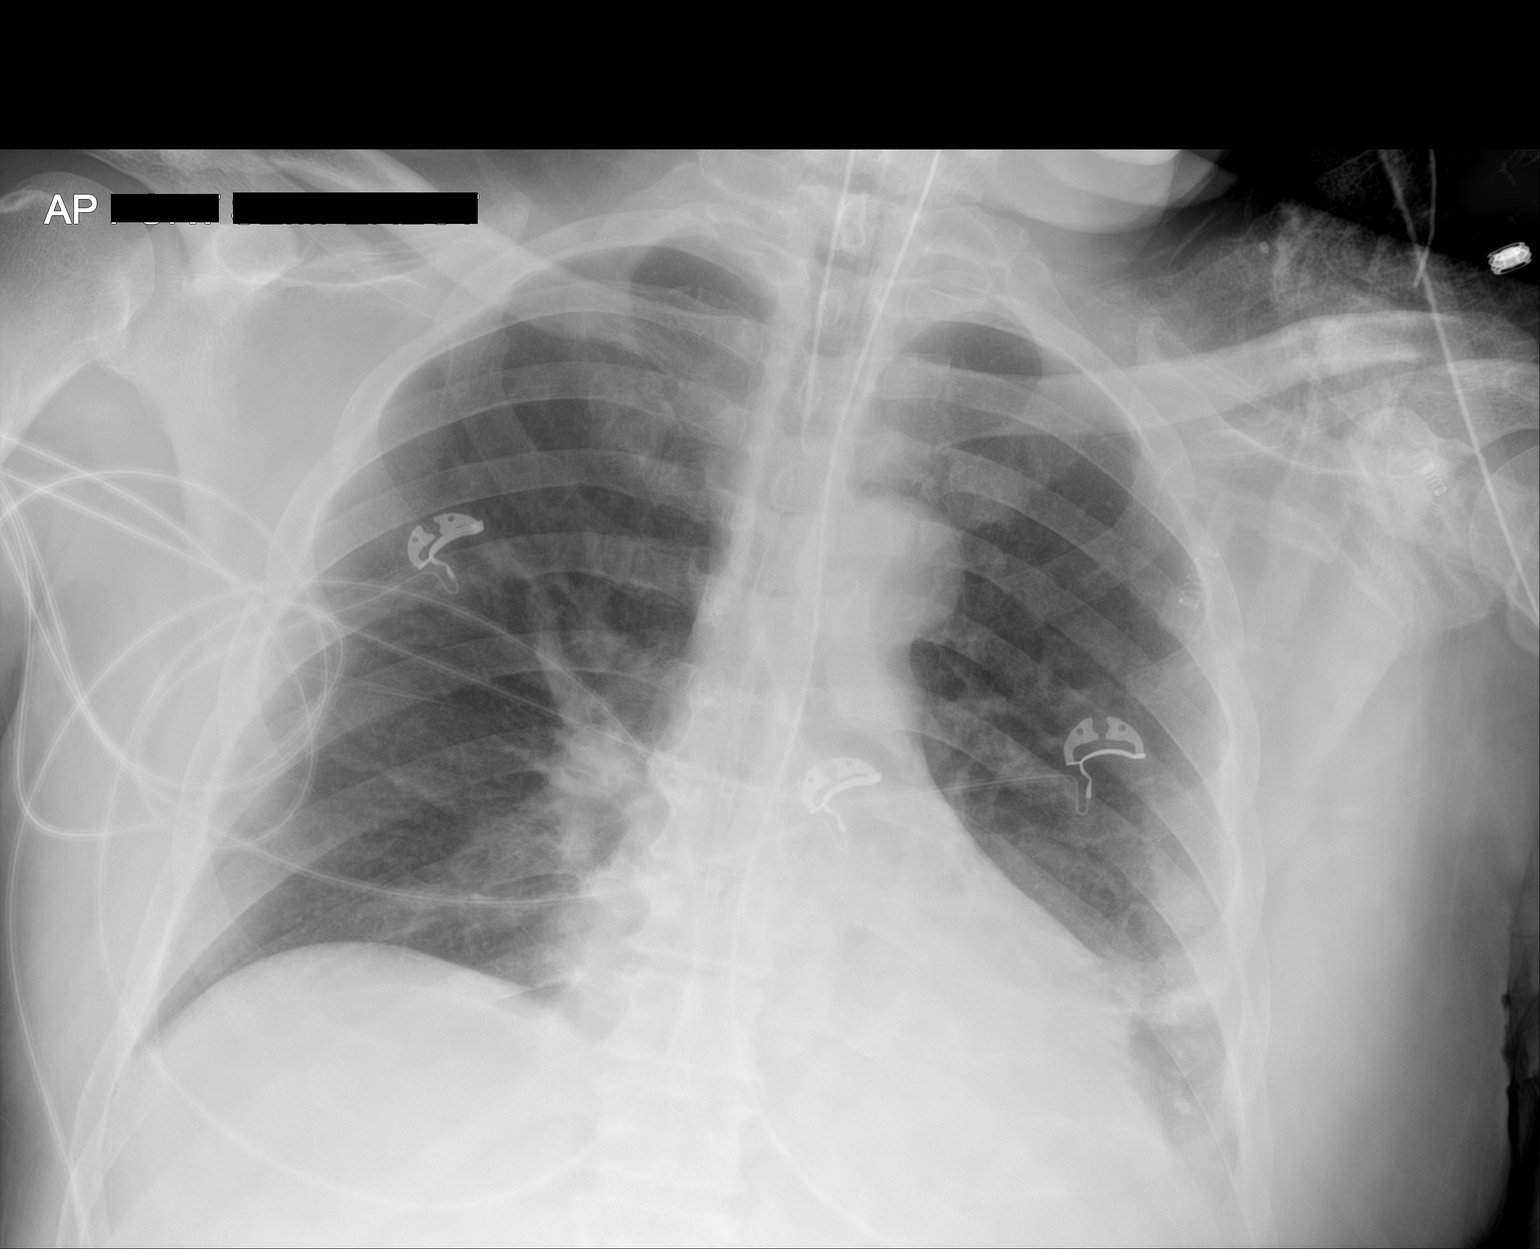

[1 of 1 positions shown; findings below may reference images not displayed]

FINDINGS: Interim removal of right IJ line. Endotracheal tube and NG tube in
stable position. Heart size normal. Interim removal of right IJ
line. Heart size normal. Right mid lung and bibasilar atelectasis
and infiltrates. Small left pleural effusion cannot be excluded. No
pneumothorax.
IMPRESSION: 1. Interim removal of right IJ line. Endotracheal tube and NG tube
in stable position. Interim removal of right IJ line.
2. Right mid lung and bibasilar atelectasis and infiltrates. Small
left pleural effusion cannot be excluded.

## 2020-07-28 MED ORDER — NITROGLYCERIN 0.4 MG/HR TD PT24
0.4000 mg | MEDICATED_PATCH | Freq: Every day | TRANSDERMAL | Status: DC
  Administered 2020-07-28 – 2020-07-30 (×3): 0.4 mg via TRANSDERMAL
  Filled 2020-07-28 (×3): qty 1

## 2020-07-28 MED ORDER — FUROSEMIDE 10 MG/ML IJ SOLN
40.0000 mg | Freq: Once | INTRAMUSCULAR | Status: AC
  Administered 2020-07-28: 40 mg via INTRAVENOUS
  Filled 2020-07-28: qty 4

## 2020-07-28 MED ORDER — VITAL 1.5 CAL PO LIQD
1000.0000 mL | ORAL | Status: DC
  Administered 2020-07-28 – 2020-07-30 (×2): 1000 mL
  Filled 2020-07-28: qty 1000

## 2020-07-28 MED ORDER — DOCUSATE SODIUM 50 MG/5ML PO LIQD
100.0000 mg | Freq: Two times a day (BID) | ORAL | Status: DC | PRN
  Filled 2020-07-28 (×2): qty 10

## 2020-07-28 MED ORDER — CARVEDILOL 25 MG PO TABS
25.0000 mg | ORAL_TABLET | Freq: Two times a day (BID) | ORAL | Status: DC
  Administered 2020-07-28 – 2020-10-18 (×162): 25 mg
  Filled 2020-07-28 (×164): qty 1

## 2020-07-28 MED ORDER — PROSOURCE TF PO LIQD
45.0000 mL | Freq: Four times a day (QID) | ORAL | Status: DC
  Administered 2020-07-28 – 2020-08-01 (×15): 45 mL
  Filled 2020-07-28 (×15): qty 45

## 2020-07-28 NOTE — Progress Notes (Signed)
Nutrition Follow Up  DOCUMENTATION CODES:   Not applicable  INTERVENTION:   Day 7 without BM, need scheduled bowel regimen  Transition tube feeding:  -Vital 1.5 @ 60 ml/hr via OG (1440 ml) -ProSource 45 ml QID -Free water flushes 200 ml Q4   Provides: 2320 kcals, 141 grams protein, 1100 ml free water (2300 ml with flushes).   NUTRITION DIAGNOSIS:   Inadequate oral intake related to inability to eat as evidenced by NPO status.  Ongoing  GOAL:   Patient will meet greater than or equal to 90% of their needs   Addressed via TF  MONITOR:   Vent status,Labs,Weight trends,TF tolerance,Skin,I & O's  REASON FOR ASSESSMENT:   Consult,Ventilator Enteral/tube feeding initiation and management  ASSESSMENT:   Patient with PMH significant for HTN and kidney stones. Presents this admission s/p cardiac arrest.  12/4- s/p L heart cath and coronary angiography  Pt discussed during ICU rounds and with RN.   TTM complete. Propofol off. Cleviprex weaning. Awaiting neurological recovery. Tolerating Vital High Protein at goal rate. No BM documented in 7 days. Only has reglan ordered. Transition tube feeding formula to better meet needs.   Admission weight: 97.7 kg  Current weight: 94.1 kg   Patient is currently intubated on ventilator support MV: 18 L/min Temp (24hrs), Avg:99.7 F (37.6 C), Min:99.3 F (37.4 C), Max:100.3 F (37.9 C)   UOP: 2240 ml x 24 hrs   Drips: cleviprex Medications: SS novolog, 10 mg reglan QID Labs: Cr 1.66-trending up CBG 146-243  Diet Order:   Diet Order            Diet NPO time specified  Diet effective now                 EDUCATION NEEDS:   Not appropriate for education at this time  Skin:  Skin Assessment: Reviewed RN Assessment  Last BM:  12/3  Height:   Ht Readings from Last 1 Encounters:  07/22/20 6' (1.829 m)    Weight:   Wt Readings from Last 1 Encounters:  07/28/20 94.1 kg    Ideal Body Weight:  80.9 kg  BMI:   Body mass index is 28.14 kg/m.  Estimated Nutritional Needs:   Kcal:  2356 kcal  Protein:  130-150 grams  Fluid:  >/= 2 L/day   Mariana Single RD, LDN Clinical Nutrition Pager listed in [place]

## 2020-07-28 NOTE — Progress Notes (Signed)
Subjective: Significant improvement overnight  Exam: Vitals:   07/28/20 1140 07/28/20 1200  BP: (!) 168/98 (!) 158/94  Pulse: 98 98  Resp: (!) 30 (!) 30  Temp:    SpO2: 97% 97%   Gen: In bed, intubated Resp: Ventilated Abd: soft, nt  Neuro: MS: He opens eyes, he does not follow commands but he does appear to orient towards noxious stimulus CN: Pupils are equal round and reactive, doll's eye maneuver intact, corneals intact, blinks to threat from the left but not right Motor: He actually appears to try to localize, bring his right arm towards the midline when noxious stimulus is being applied to the left shoulder, minimal withdrawal in the left upper extremity, significant weakness of bilateral upper extremities.  He withdraws briskly in bilateral lower extremities Sensory: He responds to noxious stimulation bilaterally   Impression: 59 yo M with significant anoxic brain injury by MRI.  Given his improvement, I think that he does have a chance of return to a degree of consciousness, though my suspicion is that he will have significant deficits even at best case scenario.  Given the improvement that we have already seen, I would favor long-term supportive care to see what degree of recovery he can make.  I discussed this with his sister and it sounds like they had already been in favor of trach/PEG, and I think this is very reasonable if he ends up needing it.   Recommendations: 1) Would continue supportive care.  2) We will be available on an as needed basis moving forward.   Antonio Rack, MD [facility] [phone]  If 7pm- 7am, please page neurology on call as listed in [place].

## 2020-07-28 NOTE — Progress Notes (Signed)
Progress Note  Patient Name: Antonio Navarro Date of Encounter: 07/28/2020  [facility] Cardiologist: Fransico Him, MD   Subjective   Sedated, intubated.   Inpatient Medications    Scheduled Meds: . acetaminophen (TYLENOL) oral liquid 160 mg/5 mL  650 mg Per Tube Q6H  . amLODipine  5 mg Per Tube Daily  . aspirin  81 mg Per Tube Daily  . atorvastatin  80 mg Per Tube Daily  . carvedilol  6.25 mg Per Tube BID WC  . chlorhexidine gluconate (MEDLINE KIT)  15 mL Mouth Rinse BID  . Chlorhexidine Gluconate Cloth  6 each Topical Daily  . free water  200 mL Per Tube Q4H  . heparin  5,000 Units Subcutaneous Q8H  . hydrALAZINE  25 mg Per Tube Q8H  . insulin aspart  0-24 Units Subcutaneous Q4H  . mouth rinse  15 mL Mouth Rinse 10 times per day  . metoCLOPramide (REGLAN) injection  10 mg Intravenous Q6H  . pantoprazole sodium  40 mg Per Tube QHS  . polyvinyl alcohol  1 drop Both Eyes QID  . sodium chloride flush  3 mL Intravenous Q12H  . ticagrelor  90 mg Per Tube BID   Continuous Infusions: . sodium chloride    . sodium chloride Stopped (07/23/20 0948)  . sodium chloride Stopped (07/22/20 0955)  . clevidipine 3 mg/hr (07/28/20 0600)  . feeding supplement (VITAL HIGH PROTEIN) 65 mL/hr at 07/28/20 0600  . norepinephrine (LEVOPHED) Adult infusion Stopped (07/22/20 1023)  . propofol (DIPRIVAN) infusion 50 mcg/kg/min (07/28/20 0600)   PRN Meds: sodium chloride, acetaminophen, chlorproMAZINE, metoprolol tartrate, ondansetron (ZOFRAN) IV, polyethylene glycol, sodium chloride flush   Vital Signs    Vitals:   07/28/20 0500 07/28/20 0600 07/28/20 0700 07/28/20 0747  BP: (!) 152/82 (!) 158/96 (!) 175/91   Pulse: 87 91 87   Resp: (!) 30 (!) 30 (!) 30   Temp:    99.3 F (37.4 C)  TempSrc:    Oral  SpO2: 98% 97% 97%   Weight:      Height:        Intake/Output Summary (Last 24 hours) at 07/28/2020 0756 Last data filed at 07/28/2020 0600 Gross per 24 hour  Intake 4132.7 ml   Output 3040 ml  Net 1092.7 ml   Last 3 Weights 07/28/2020 07/27/2020 07/26/2020  Weight (lbs) 207 lb 7.3 oz 208 lb 15.9 oz 212 lb 1.3 oz  Weight (kg) 94.1 kg 94.8 kg 96.2 kg      Telemetry    Sinus with SVT - Personally Reviewed  ECG    No AM EKG - Personally Reviewed  Physical Exam   General: Intubated, sedated.  Neck: No JVD Lungs:Clear bilaterally Cardiovascular: Regular rate and rhythm. No murmurs, gallops or rubs. Abdomen:Soft. Bowel sounds present.  Extremities: No lower extremity edema.   Labs    High Sensitivity Troponin:   Recent Labs  Lab 07/22/20 0418 07/22/20 1220  TROPONINIHS 818* 24,533*      Chemistry Recent Labs  Lab 07/25/20 0343 07/26/20 0526 07/27/20 0318 07/28/20 0009  NA 139 140 144 143  K 3.7 3.5 3.3* 3.8  CL 110 110 111 112*  CO2 19* 18* 21* 21*  GLUCOSE 191* 171* 172* 198*  BUN 28* 30* 36* 37*  CREATININE 1.56* 1.51* 1.61* 1.66*  CALCIUM 8.2* 8.4* 8.8* 8.2*  PROT 5.7* 5.9* 6.6  --   ALBUMIN 2.4* 2.3* 2.6*  --   AST 71* 66* 73*  --  ALT 47* 43 52*  --   ALKPHOS 63 105 161*  --   BILITOT 1.4* 1.4* 1.7*  --   GFRNONAA 51* 53* 49* 47*  ANIONGAP _0 Hematology Recent Labs  Lab 07/26/20 0526 07/27/20 0318 07/28/20 0009  WBC 11.3* 11.9* 10.8*  RBC 3.25* 3.35* 2.83*  HGB 10.6* 11.2* 9.2*  HCT 31.4* 32.7* 28.7*  MCV 96.6 97.6 101.4*  MCH 32.6 33.4 32.5  MCHC 33.8 34.3 32.1  RDW 13.0 13.3 13.5  PLT 143* 190 206    BNPNo results for input(s): BNP, PROBNP in the last 168 hours.   DDimer No results for input(s): DDIMER in the last 168 hours.   Radiology    EEG  Result Date: 07/27/2020 Lora Havens, MD     07/27/2020  9:42 AM Patient Name: Antonio Navarro MRN: [id number] Epilepsy Attending: Lora Havens Referring Physician/Provider: Dr Ina Homes Date: 07/27/2020 Duration: 25.36 mins  Patient history: 59yo M s/p cardiac arrest. EEG to evaluate for seizure  Level of alertness: comatose  AEDs during  EEG study: Propofol  Technical aspects: This EEG study was done with scalp electrodes positioned according to the 10-20 International system of electrode placement. Electrical activity was acquired at a sampling rate of 500Hz and reviewed with a high frequency filter of 70Hz and a low frequency filter of 1Hz. EEG data were recorded continuously and digitally stored.  Description: EEG showed continuous generalized 6-9Hz theta-alpha activity admixed with intermittent generalized 2-3Hz delta slowing.  ABNORMALITY -Continuous slow, generalized  IMPRESSION: This study issuggestive ofseverediffuse encephalopathy, nonspecific etiology but likely related to sedation, anoxic-hypoxic injury.No seizures or epileptiform discharges were seen throughout the recording.  EEG appears to be similar to previous eeg on 07/24/2020.  Lora Havens   Cardiac Studies   Echo 07/23/20:   1. Left ventricular ejection fraction, by estimation, is 40 to 45%. The  left ventricle has mildly decreased function. Left ventricular endocardial  border not optimally defined to evaluate regional wall motion, but  regional wall motion abnormalities are  present. There is mild left ventricular hypertrophy. Left ventricular  diastolic parameters are consistent with Grade I diastolic dysfunction  (impaired relaxation).  2. Right ventricular systolic function is mildly reduced. The right  ventricular size is normal. Tricuspid regurgitation signal is inadequate  for assessing PA pressure.  3. The aortic valve is tricuspid. Aortic valve regurgitation is not  visualized. No aortic stenosis is present.  4. The mitral valve is grossly normal. No evidence of mitral valve  regurgitation. No evidence of mitral stenosis.  5. The inferior vena cava is normal in size with <50% respiratory  variability, suggesting right atrial pressure of 8 mmHg.   Patient Profile     59 y.o. male with history of tobacco abuse, obesity, HTN  who was admitted 07/22/20 post cardiac arrest with inferolateral STEMI. He was resuscitated in the field by EMS after being found to be in V fib. He was intubated on arrival. Cardiac cath with 95% Circumflex stenosis treated with a drug eluting stent. LvEF=40-45% by echo 07/23/20.    Assessment & Plan    1. Cardiac arrest secondary to acute MI: s/p stenting of the Circumflex. He has completed the cooling protocol. Hemodynamically stable. Awaiting neurological recovery. Overall poor prognosis. Continue ASA, Brilinta, beta blocker and statin.     2. Ischemic cardiomyopathy: LVEF=40-45%. Continue beta blocker. Will not start an Ace-inh or ARB given his renal insufficiency. Continue hydralazine  3. Hypoxic respiratory failure/Anoxic encephalopathy: PCCM and Neurology following.   Poor prognosis from neuro standpoint. No new cardiac recommendations today.   For questions or updates, please contact [place] Please consult [email or web address] for contact info under      Signed, Lauree Chandler, MD  07/28/2020, 7:56 AM

## 2020-07-28 NOTE — Progress Notes (Signed)
NAME:  Antonio Navarro, MRN:  [id number], DOB:  1961/02/23, LOS: 6 ADMISSION DATE:  07/22/2020, CONSULTATION DATE: 07/22/2020 REFERRING MD: Dr. Roxanne Mins, CHIEF COMPLAINT: Status post arrest  Brief History   Patient is 59 year old male status post arrest  History of present illness   Patient is a 59 year old male brought to the emergency room after out-of-hospital cardiac arrest.  His wife apparently noticed that while he was sleeping he had agonal breathing and called 911.  He was without resuscitative effort for about 9 minutes.  Patient was found to be apneic and pulls list with initial rhythm of ventricular fibrillation.  He was intubated with [facility] airway and received 6 cardioversion attempts.  He was loaded with amiodarone and got a total of 6 rounds of 1 mg epinephrine.  He had return of spontaneous circulation.  ER physician estimates that time of CPR efforts were 30 minutes.  King airway was replaced in the emergency room. At time of dictation patient is being taken to the cardiac Cath Lab.  On return of sinus rhythm the patient was found to have ST changes in the posterior and inferior leads.  Laboratory show an anion gap of 19 bicarb of 15 lactic acid is pending, glucose is 286, creatinine is 1.65 GFR estimated 45.  Past Medical History   . Hypertension   . Kidney stones      Significant Hospital Events   12/4-DES to 95% circumflex stenosis  Consults:  Cardiology, PCCM  Procedures:  As above  Significant Diagnostic Tests:  12/4 CT head>>no acute findings 12/5 Echo>>EF 84-16%, grade 1 diastolic dysfunction 60/6 EEG>> diffuse encephalopathy but no seizure activity. Micro Data:  12/4 Covid and flu>>negative 12/4 MRSA>>neg 12/4 Resp culture>>  Antimicrobials:  Ceftriaxone 12/4-  Interim history/subjective:  Increasing hypertension as sedation stopped.  Objective   Blood pressure (!) 159/96, pulse 90, temperature 99.3 F (37.4 C), temperature source Oral, resp. rate  (!) 30, height 6' (1.829 m), weight 94.1 kg, SpO2 96 %.    Vent Mode: PRVC FiO2 (%):  [30 %-40 %] 30 % Set Rate:  [30 bmp] 30 bmp Vt Set:  [620 mL] 620 mL PEEP:  [5 cmH20] 5 cmH20 Plateau Pressure:  [16 cmH20-26 cmH20] 24 cmH20   Intake/Output Summary (Last 24 hours) at 07/28/2020 1006 Last data filed at 07/28/2020 0600 Gross per 24 hour  Intake 3437.7 ml  Output 2340 ml  Net 1097.7 ml   Filed Weights   07/26/20 0100 07/27/20 0500 07/28/20 0200  Weight: 96.2 kg 94.8 kg 94.1 kg      Examination: Constitutional: no acute distress  Eyes: not tracking, pupils equal Ears, nose, mouth, and throat: ETT in place Cardiovascular: RRR, ext warm Respiratory: Diminished at bases, triggering vent Gastrointestinal: hypoactive BS, slightly distended Skin: No rashes, normal turgor Neurologic: opens eyes and turns head to voice.  [place] localizes to pain Psychiatric: cannot asses  CBC/BMP/CBG stable  Resolved Hospital Problem list   NA  Assessment & Plan:  OOH Vfib arrest , Ischemic Cardiomyopathy- s/p PCI w/ DES placement Anoxic encephalopathy-showing signs of cortical recovery. Acute hypoxemic respiratory failure on vent secondary to cardiac arrest and aspiration AKI- stable HTN- worse with sedation wean Constipation Worsening hypernatremia- no signs of DI at present  -Increase PO antihypertensive regimen, wean off cleviprex. -Secondary to cardiac prevent - Continue vent support, mental status precludes extubation - Appreciate neuro input, guarded prognosis but some cortical signs on exam suggest that he may have some improvement over time -  Keep off all sedation  Best practice (evaluated daily)   Diet: continue TF Pain/Anxiety/Delirium protocol (if indicated): Off. VAP protocol (if indicated): Yes DVT prophylaxis: heparin GI prophylaxis: Yes Glucose control: Sliding scale insulin Mobility: Bedrest Family Communication: daily Code Status: Full Disposition:  ICU   CRITICAL CARE Performed by: Kipp Brood   Total critical care time: 40 minutes  Critical care time was exclusive of separately billable procedures and treating other patients.  Critical care was necessary to treat or prevent imminent or life-threatening deterioration.  Critical care was time spent personally by me on the following activities: development of treatment plan with patient and/or surrogate as well as nursing, discussions with consultants, evaluation of patient's response to treatment, examination of patient, obtaining history from patient or surrogate, ordering and performing treatments and interventions, ordering and review of laboratory studies, ordering and review of radiographic studies, pulse oximetry, re-evaluation of patient's condition and participation in multidisciplinary rounds.  Kipp Brood, MD [facility] ICU Physician [place]  Pager: [phone] Mobile: [phone] After hours: [phone].

## 2020-07-29 LAB — CBC
HCT: 29.9 % — ABNORMAL LOW (ref 39.0–52.0)
Hemoglobin: 9.7 g/dL — ABNORMAL LOW (ref 13.0–17.0)
MCH: 32.6 pg (ref 26.0–34.0)
MCHC: 32.4 g/dL (ref 30.0–36.0)
MCV: 100.3 fL — ABNORMAL HIGH (ref 80.0–100.0)
Platelets: 236 10*3/uL (ref 150–400)
RBC: 2.98 MIL/uL — ABNORMAL LOW (ref 4.22–5.81)
RDW: 13.4 % (ref 11.5–15.5)
WBC: 13.5 10*3/uL — ABNORMAL HIGH (ref 4.0–10.5)
nRBC: 0 % (ref 0.0–0.2)

## 2020-07-29 LAB — GLUCOSE, CAPILLARY
Glucose-Capillary: 238 mg/dL — ABNORMAL HIGH (ref 70–99)
Glucose-Capillary: 201 mg/dL — ABNORMAL HIGH (ref 70–99)
Glucose-Capillary: 275 mg/dL — ABNORMAL HIGH (ref 70–99)
Glucose-Capillary: 247 mg/dL — ABNORMAL HIGH (ref 70–99)
Glucose-Capillary: 252 mg/dL — ABNORMAL HIGH (ref 70–99)
Glucose-Capillary: 234 mg/dL — ABNORMAL HIGH (ref 70–99)

## 2020-07-29 LAB — HEMOGLOBIN A1C
Hgb A1c MFr Bld: 4.7 % — ABNORMAL LOW (ref 4.8–5.6)
Mean Plasma Glucose: 88.19 mg/dL

## 2020-07-29 LAB — BASIC METABOLIC PANEL
Anion gap: 12 (ref 5–15)
BUN: 45 mg/dL — ABNORMAL HIGH (ref 6–20)
CO2: 19 mmol/L — ABNORMAL LOW (ref 22–32)
Calcium: 9 mg/dL (ref 8.9–10.3)
Chloride: 112 mmol/L — ABNORMAL HIGH (ref 98–111)
Creatinine, Ser: 1.62 mg/dL — ABNORMAL HIGH (ref 0.61–1.24)
GFR, Estimated: 49 mL/min — ABNORMAL LOW (ref >60)
Glucose, Bld: 308 mg/dL — ABNORMAL HIGH (ref 70–99)
Potassium: 4.1 mmol/L (ref 3.5–5.1)
Sodium: 143 mmol/L (ref 135–145)

## 2020-07-29 LAB — MAGNESIUM: Magnesium: 2.1 mg/dL (ref 1.7–2.4)

## 2020-07-29 LAB — PHOSPHORUS: Phosphorus: 3.3 mg/dL (ref 2.5–4.6)

## 2020-07-29 MED ORDER — LOSARTAN POTASSIUM 25 MG PO TABS: 25.0000 mg | ORAL_TABLET | Freq: Every day | ORAL | Status: DC

## 2020-07-29 MED ORDER — LOSARTAN POTASSIUM 25 MG PO TABS
25.0000 mg | ORAL_TABLET | Freq: Every day | ORAL | Status: DC
  Administered 2020-07-29 – 2020-07-30 (×2): 25 mg
  Filled 2020-07-29 (×2): qty 1

## 2020-07-29 MED ORDER — FENTANYL CITRATE (PF) 100 MCG/2ML IJ SOLN
50.0000 ug | INTRAMUSCULAR | Status: DC | PRN
  Administered 2020-07-29 – 2020-07-30 (×2): 50 ug via INTRAVENOUS
  Filled 2020-07-29 (×2): qty 2

## 2020-07-29 MED ORDER — INSULIN GLARGINE 100 UNIT/ML ~~LOC~~ SOLN
12.0000 [IU] | Freq: Two times a day (BID) | SUBCUTANEOUS | Status: DC
  Administered 2020-07-29 – 2020-07-30 (×4): 12 [IU] via SUBCUTANEOUS
  Filled 2020-07-29 (×6): qty 0.12

## 2020-07-29 MED ORDER — INSULIN ASPART 100 UNIT/ML ~~LOC~~ SOLN
0.0000 [IU] | SUBCUTANEOUS | Status: DC
  Administered 2020-07-29: 08:00:00 7 [IU] via SUBCUTANEOUS
  Administered 2020-07-29 (×2): 11 [IU] via SUBCUTANEOUS
  Administered 2020-07-29: 21:00:00 7 [IU] via SUBCUTANEOUS
  Administered 2020-07-30 (×2): 11 [IU] via SUBCUTANEOUS
  Administered 2020-07-30: 01:00:00 7 [IU] via SUBCUTANEOUS
  Administered 2020-07-30: 16:00:00 11 [IU] via SUBCUTANEOUS
  Administered 2020-07-30: 04:00:00 4 [IU] via SUBCUTANEOUS
  Administered 2020-07-30: 21:00:00 7 [IU] via SUBCUTANEOUS
  Administered 2020-07-31 (×2): 3 [IU] via SUBCUTANEOUS
  Administered 2020-07-31: 05:00:00 7 [IU] via SUBCUTANEOUS
  Administered 2020-07-31: 01:00:00 4 [IU] via SUBCUTANEOUS
  Administered 2020-07-31: 08:00:00 7 [IU] via SUBCUTANEOUS
  Administered 2020-08-01 – 2020-08-02 (×6): 3 [IU] via SUBCUTANEOUS
  Administered 2020-08-02 (×2): 7 [IU] via SUBCUTANEOUS
  Administered 2020-08-02: 12:00:00 4 [IU] via SUBCUTANEOUS
  Administered 2020-08-02: 08:00:00 3 [IU] via SUBCUTANEOUS
  Administered 2020-08-03: 17:00:00 7 [IU] via SUBCUTANEOUS
  Administered 2020-08-03: 09:00:00 11 [IU] via SUBCUTANEOUS
  Administered 2020-08-03: 01:00:00 4 [IU] via SUBCUTANEOUS
  Administered 2020-08-03 – 2020-08-04 (×8): 7 [IU] via SUBCUTANEOUS
  Administered 2020-08-05: 13:00:00 15 [IU] via SUBCUTANEOUS
  Administered 2020-08-05: 11:00:00 6 [IU] via SUBCUTANEOUS
  Administered 2020-08-05: 18:00:00 11 [IU] via SUBCUTANEOUS
  Administered 2020-08-05: 20:00:00 15 [IU] via SUBCUTANEOUS
  Administered 2020-08-05 (×2): 7 [IU] via SUBCUTANEOUS
  Administered 2020-08-06 (×2): 11 [IU] via SUBCUTANEOUS
  Administered 2020-08-06: 12:00:00 4 [IU] via SUBCUTANEOUS
  Administered 2020-08-06: 22:00:00 7 [IU] via SUBCUTANEOUS
  Administered 2020-08-06: 09:00:00 4 [IU] via SUBCUTANEOUS
  Administered 2020-08-06: 18:00:00 11 [IU] via SUBCUTANEOUS
  Administered 2020-08-07: 21:00:00 15 [IU] via SUBCUTANEOUS
  Administered 2020-08-07 (×3): 11 [IU] via SUBCUTANEOUS
  Administered 2020-08-07: 01:00:00 15 [IU] via SUBCUTANEOUS
  Administered 2020-08-07 – 2020-08-08 (×4): 11 [IU] via SUBCUTANEOUS
  Administered 2020-08-08 (×2): 15 [IU] via SUBCUTANEOUS
  Administered 2020-08-08 – 2020-08-09 (×5): 11 [IU] via SUBCUTANEOUS
  Administered 2020-08-09 – 2020-08-10 (×3): 15 [IU] via SUBCUTANEOUS
  Administered 2020-08-10 (×2): 11 [IU] via SUBCUTANEOUS
  Administered 2020-08-10: 20:00:00 15 [IU] via SUBCUTANEOUS
  Administered 2020-08-10: 06:00:00 11 [IU] via SUBCUTANEOUS
  Administered 2020-08-11: 09:00:00 7 [IU] via SUBCUTANEOUS
  Administered 2020-08-11: 18:00:00 4 [IU] via SUBCUTANEOUS
  Administered 2020-08-11: 15 [IU] via SUBCUTANEOUS
  Administered 2020-08-11: 05:00:00 7 [IU] via SUBCUTANEOUS
  Administered 2020-08-11 (×2): 11 [IU] via SUBCUTANEOUS
  Administered 2020-08-11: 12:00:00 7 [IU] via SUBCUTANEOUS
  Administered 2020-08-12 (×3): 4 [IU] via SUBCUTANEOUS
  Administered 2020-08-12: 17:00:00 7 [IU] via SUBCUTANEOUS
  Administered 2020-08-12: 09:00:00 4 [IU] via SUBCUTANEOUS
  Administered 2020-08-13 (×3): 7 [IU] via SUBCUTANEOUS
  Administered 2020-08-13: 09:00:00 4 [IU] via SUBCUTANEOUS
  Administered 2020-08-13 – 2020-08-14 (×2): 7 [IU] via SUBCUTANEOUS
  Administered 2020-08-14: 02:00:00 3 [IU] via SUBCUTANEOUS
  Administered 2020-08-14 – 2020-08-15 (×6): 4 [IU] via SUBCUTANEOUS
  Administered 2020-08-15: 17:00:00 1 [IU] via SUBCUTANEOUS
  Administered 2020-08-15 – 2020-08-16 (×7): 4 [IU] via SUBCUTANEOUS
  Administered 2020-08-16: 16:00:00 7 [IU] via SUBCUTANEOUS
  Administered 2020-08-17: 01:00:00 3 [IU] via SUBCUTANEOUS
  Administered 2020-08-17: 05:00:00 4 [IU] via SUBCUTANEOUS

## 2020-07-29 NOTE — Progress Notes (Addendum)
Palliative Medicine Inpatient Follow Up Note   Reason for Consultation: Establishing goals of care and Psychosocial/spiritual support  HPI/Patient Profile: 59 y.o. male   admitted on 07/22/2020 with PMH for hypertension who presented on 12/4 with V. fib arrest in the setting of STEMI.   Patient currently critically ill in the ICU, he remains intubated.  Severediffuse encephalopathy  Severe ischemic cardiomyopathy, severe anoxic encephalopathy.  Improving acute kidney injury, improving hypernatremia.  Long term prognosis is guarded  Patient and his family face treatment option decisions, advanced directive decisions, and anticipatory care needs. (LTAC versus comfort care allowing for a natural death.)  Family meeting (2020-08-26):  Family meeting held at [facility]  Provider(s): Dr. Lynetta Mare and Tacey Ruiz  Family Present: Antonio [facility] (finace), Antonio Navarro (younger sister), Antonio Navarro (son), and Antonio [facility]arro (older sister)  Discussion: Conversation led by Dr. Lynetta Mare.  Introductions were made and palliative care was explained.  Dr. Lynetta Mare shared that Antonio Navarro had made some improvements in the last 2 days.  He shared with Antonio Navarro's family that we are at a point where he believes we should continue with the present course until mid week at which time we can further determine if Antonio Navarro will need a tracheostomy and/or PEG tube.  He explains that per neurology and per his own knowledge he believes Antonio Navarro could make improvements though it would take some time 3 to 6 months to see what his maximum improvements may end up being.  He described a curve approach to reviewing the progress Antonio Navarro has made and if in a month Antonio Navarro does not improve and then 2 months he does not improve and then 3 months he does not improve there should be greater consideration as to what quality of life would be appropriate for Antonio Navarro.  Dr. Lynetta Mare shared that sometimes patients trajectory can be unpredictable.   He explained that we are on a narrow path for Antonio Navarro in regard to his other comorbid conditions.  He shares that if something more goes wrong and Antonio Navarro case it can set him into a cascade of negative events.  For now though there is optimism that he may improve and ideally continue to improve.  After Dr. Lynetta Mare left I was able to speak more to Antonio Navarro family about discharge planning.  We were able to discuss if in a few months Antonio Navarro neglects to make improvement taking a harder look at what may be appropriate in terms of quality of life.  We talked about what would be acceptable to Antonio Navarro and if him being confined to a bed would  Antonio Navarro not being able to interact with family would ever be something he would want.  I shared our hope is always for patients to improve though sometimes despite great effort that does not happen.  Antonio Navarro's family was very thankful for the updates by both Dr. Lynetta Mare and myself and remained to have hope for the future.  Discussed the importance of continued conversation with family and their  medical providers regarding overall plan of care and treatment options, ensuring decisions are within the context of the patients values and GOCs.  Questions and concerns addressed   Objective Assessment: Vital Signs Vitals:   08-26-20 0500 August 26, 2020 0600  BP: (!) 154/100 (!) 143/94  Pulse: (!) 105 92  Resp: (!) 24 (!) 30  Temp:    SpO2: 99% 100%    Intake/Output Summary (Last 24 hours) at 08-26-20 0654 Last data filed at Aug 26, 2020 0528 Gross per 24 hour  Intake  1159.89 ml  Output 5425 ml  Net -4265.11 ml   Last Weight  Most recent update: 07/29/2020  5:48 AM   Weight  93.1 kg (205 lb 4 oz)           Physical Exam Constitutional:      Appearance: He is overweight. He is ill-appearing.     Interventions: He is intubated.  Cardiovascular:     Rate and Rhythm: Normal rate.  Pulmonary:     Effort: He is intubated.  Skin:    General: Skin is warm and dry.   Neurological:     Cranial Nerves: Cranial nerve deficit present.  SUMMARY OF RECOMMENDATIONS   Full Code / Full Scope of Care  Family in agreement with a Tracheostomy and PEG tube if it is determined these are needed later in the week  Ongoing [place] conversations based upon improvements or declines  PMT will continue to incrementally follow along  Time Spent: 50 Greater than 50% of the time was spent in counseling and coordination of care ______________________________________________________________________________________ [place] Team Team Cell Phone: [phone] Please utilize secure chat with additional questions, if there is no response within 30 minutes please call the above phone number  Palliative Medicine Team providers are available by phone from 7am to 7pm daily and can be reached through the team cell phone.  Should this patient require assistance outside of these hours, please call the patient's attending physician.

## 2020-07-29 NOTE — Progress Notes (Signed)
eLink Physician-Brief Progress Note Patient Name: Antonio [facility]arro DOB: 1961/02/09 MRN: [id number]   Date of Service  07/29/2020  HPI/Events of Note  Propofol was turned off yesterday at direction of Dr. Lynetta Mare. Day shift was told MD was OK with PRN fentanyl pushes if needed, but none ordered. Patient now thrashing bed intermittently. RN requests fentanyl pushes.  Also CBGs increasing to 280-300s on high intensity sliding scale. No glargine ordered. Received 48 units of Aspart in last 24 hours.   eICU Interventions  Ordered fentanyl 50 mcg IV Q1H PRN agitation/pain.  Ordered Lantus 12u [place] BID in addition to his high intensity sliding scale insulin.     Intervention Category Intermediate Interventions: Hyperglycemia - evaluation and treatment Minor Interventions: Agitation / anxiety - evaluation and management  Marily Lente Latima Hamza 07/29/2020, 4:32 AM

## 2020-07-29 NOTE — Progress Notes (Signed)
Progress Note  Patient Name: Antonio [facility]arro Date of Encounter: 07/29/2020  [facility] Cardiologist: Fransico Him, MD   Subjective   Sedated, intubated. Moves head and opens eyes.  Inpatient Medications    Scheduled Meds: . acetaminophen (TYLENOL) oral liquid 160 mg/5 mL  650 mg Per Tube Q6H  . amLODipine  5 mg Per Tube Daily  . aspirin  81 mg Per Tube Daily  . atorvastatin  80 mg Per Tube Daily  . carvedilol  25 mg Per Tube BID WC  . chlorhexidine gluconate (MEDLINE KIT)  15 mL Mouth Rinse BID  . Chlorhexidine Gluconate Cloth  6 each Topical Daily  . feeding supplement (PROSource TF)  45 mL Per Tube QID  . free water  200 mL Per Tube Q4H  . heparin  5,000 Units Subcutaneous Q8H  . hydrALAZINE  25 mg Per Tube Q8H  . insulin aspart  0-20 Units Subcutaneous Q4H  . insulin glargine  12 Units Subcutaneous BID  . mouth rinse  15 mL Mouth Rinse 10 times per day  . metoCLOPramide (REGLAN) injection  10 mg Intravenous Q6H  . nitroGLYCERIN  0.4 mg Transdermal Daily  . pantoprazole sodium  40 mg Per Tube QHS  . polyvinyl alcohol  1 drop Both Eyes QID  . sodium chloride flush  3 mL Intravenous Q12H  . ticagrelor  90 mg Per Tube BID   Continuous Infusions: . sodium chloride    . sodium chloride Stopped (07/23/20 0948)  . sodium chloride Stopped (07/22/20 0955)  . clevidipine Stopped (07/28/20 1853)  . feeding supplement (VITAL 1.5 CAL) 60 mL/hr at 07/29/20 0918  . propofol (DIPRIVAN) infusion Stopped (07/28/20 1349)   PRN Meds: sodium chloride, acetaminophen, chlorproMAZINE, docusate, fentaNYL (SUBLIMAZE) injection, metoprolol tartrate, ondansetron (ZOFRAN) IV, polyethylene glycol, sodium chloride flush   Vital Signs    Vitals:   07/29/20 0600 07/29/20 0700 07/29/20 0800 07/29/20 0900  BP: (!) 143/94 (!) 161/100 (!) 165/96 136/90  Pulse: 92 95 (!) 104 95  Resp: (!) 30 (!) 30 (!) 30 (!) 31  Temp:   98.3 F (36.8 C)   TempSrc:   Oral   SpO2: 100% 99% 97% 99%  Weight:       Height:        Intake/Output Summary (Last 24 hours) at 07/29/2020 0937 Last data filed at 07/29/2020 0918 Gross per 24 hour  Intake 1352.89 ml  Output 4475 ml  Net -3122.11 ml   Last 3 Weights 07/29/2020 07/28/2020 07/27/2020  Weight (lbs) 205 lb 4 oz 207 lb 7.3 oz 208 lb 15.9 oz  Weight (kg) 93.1 kg 94.1 kg 94.8 kg      Telemetry    Sinus with SVT - Personally Reviewed  ECG    No AM EKG - Personally Reviewed  Physical Exam   General: Intubated, sedated.  Neck: No JVD Lungs:Clear bilaterally Cardiovascular: Regular rate and rhythm. No murmurs, gallops or rubs. Abdomen:Soft. Bowel sounds present.  Extremities: No lower extremity edema.   Labs    High Sensitivity Troponin:   Recent Labs  Lab 07/22/20 0418 07/22/20 1220  TROPONINIHS 818* 24,533*      Chemistry Recent Labs  Lab 07/25/20 0343 07/26/20 0526 07/27/20 0318 07/28/20 0009 07/29/20 0051  NA 139 140 144 143 143  K 3.7 3.5 3.3* 3.8 4.1  CL 110 110 111 112* 112*  CO2 19* 18* 21* 21* 19*  GLUCOSE 191* 171* 172* 198* 308*  BUN 28* 30* 36* 37* 45*  CREATININE 1.56* 1.51* 1.61* 1.66* 1.62*  CALCIUM 8.2* 8.4* 8.8* 8.2* 9.0  PROT 5.7* 5.9* 6.6  --   --   ALBUMIN 2.4* 2.3* 2.6*  --   --   AST 71* 66* 73*  --   --   ALT 47* 43 52*  --   --   ALKPHOS 63 105 161*  --   --   BILITOT 1.4* 1.4* 1.7*  --   --   GFRNONAA 51* 53* 49* 47* 49*  ANIONGAP _0 Hematology Recent Labs  Lab 07/27/20 0318 07/28/20 0009 07/29/20 0051  WBC 11.9* 10.8* 13.5*  RBC 3.35* 2.83* 2.98*  HGB 11.2* 9.2* 9.7*  HCT 32.7* 28.7* 29.9*  MCV 97.6 101.4* 100.3*  MCH 33.4 32.5 32.6  MCHC 34.3 32.1 32.4  RDW 13.3 13.5 13.4  PLT 190 206 236    BNPNo results for input(s): BNP, PROBNP in the last 168 hours.   DDimer No results for input(s): DDIMER in the last 168 hours.   Radiology    DG Chest Port 1 View  Result Date: 07/28/2020 CLINICAL DATA:  CPR.  Cardiac arrest. EXAM: PORTABLE CHEST 1 VIEW  COMPARISON:  07/23/2020. FINDINGS: Interim removal of right IJ line. Endotracheal tube and NG tube in stable position. Heart size normal. Interim removal of right IJ line. Heart size normal. Right mid lung and bibasilar atelectasis and infiltrates. Small left pleural effusion cannot be excluded. No pneumothorax. IMPRESSION: 1. Interim removal of right IJ line. Endotracheal tube and NG tube in stable position. Interim removal of right IJ line. 2. Right mid lung and bibasilar atelectasis and infiltrates. Small left pleural effusion cannot be excluded. Electronically Signed   By: Marcello Moores  Register   On: 07/28/2020 07:58    Cardiac Studies   Echo 07/23/20: 1. Left ventricular ejection fraction, by estimation, is 40 to 45%. The  left ventricle has mildly decreased function. Left ventricular endocardial  border not optimally defined to evaluate regional wall motion, but  regional wall motion abnormalities are  present. There is mild left ventricular hypertrophy. Left ventricular  diastolic parameters are consistent with Grade I diastolic dysfunction  (impaired relaxation).  2. Right ventricular systolic function is mildly reduced. The right  ventricular size is normal. Tricuspid regurgitation signal is inadequate  for assessing PA pressure.  3. The aortic valve is tricuspid. Aortic valve regurgitation is not  visualized. No aortic stenosis is present.  4. The mitral valve is grossly normal. No evidence of mitral valve  regurgitation. No evidence of mitral stenosis.  5. The inferior vena cava is normal in size with <50% respiratory  variability, suggesting right atrial pressure of 8 mmHg.    Patient Profile     59 y.o. male with history of tobacco abuse, obesity, HTN who was admitted 07/22/20 post cardiac arrest with inferolateral STEMI. He was resuscitated in the field by EMS after being found to be in V fib. He was intubated on arrival. Cardiac cath with 95% Circumflex stenosis treated with a  drug eluting stent. LvEF=40-45% by echo 07/23/20.    Assessment & Plan    1. Cardiac arrest secondary to acute MI: s/p stenting of the Circumflex. He has completed the cooling protocol. Hemodynamically stable. Awaiting neurological recovery. Overall poor prognosis. Continue ASA, Brilinta, beta blocker and statin.   Followed by palliative care.   2. Ischemic cardiomyopathy: LVEF=40-45%. Continue beta blocker. Will not start an Ace-inh or ARB given his renal insufficiency. Continue  hydralazine  3. Hypoxic respiratory failure/Anoxic encephalopathy: PCCM and Neurology following.   4. Acute systolic and diastolic CHF, no signs of fluid overload, diuresing well, Crea stable at 1.6,  Off pressors since yesterday.  Poor prognosis from neuro standpoint. No new cardiac recommendations today.   For questions or updates, please contact [place] Please consult [email or web address] for contact info under    Signed, Ena Dawley, MD

## 2020-07-29 NOTE — Progress Notes (Signed)
NAME:  Antonio Navarro, MRN:  [id number], DOB:  04-22-1961, LOS: 7 ADMISSION DATE:  07/22/2020, CONSULTATION DATE: 07/22/2020 REFERRING MD: Dr. Roxanne Mins, CHIEF COMPLAINT: Status post arrest  Brief History   Patient is 59 year old male status post arrest  History of present illness   Patient is a 59 year old male brought to the emergency room after out-of-hospital cardiac arrest.  His wife apparently noticed that while he was sleeping he had agonal breathing and called 911.  He was without resuscitative effort for about 9 minutes.  Patient was found to be apneic and pulls list with initial rhythm of ventricular fibrillation.  He was intubated with [facility] airway and received 6 cardioversion attempts.  He was loaded with amiodarone and got a total of 6 rounds of 1 mg epinephrine.  He had return of spontaneous circulation.  ER physician estimates that time of CPR efforts were 30 minutes.  King airway was replaced in the emergency room. At time of dictation patient is being taken to the cardiac Cath Lab.  On return of sinus rhythm the patient was found to have ST changes in the posterior and inferior leads.  Laboratory show an anion gap of 19 bicarb of 15 lactic acid is pending, glucose is 286, creatinine is 1.65 GFR estimated 45.  Past Medical History   . Hypertension   . Kidney stones      Significant Hospital Events   12/4-DES to 95% circumflex stenosis  Consults:  Cardiology, PCCM  Procedures:  As above  Significant Diagnostic Tests:  12/4 CT head>>no acute findings 12/5 Echo>>EF 52-77%, grade 1 diastolic dysfunction 82/4 EEG>> diffuse encephalopathy but no seizure activity. Micro Data:  12/4 Covid and flu>>negative 12/4 MRSA>>neg 12/4 Resp culture>>  Antimicrobials:  Ceftriaxone 12/4-  Interim history/subjective:  More awake over the last 24h.   Objective   Blood pressure 136/90, pulse 95, temperature 99.9 F (37.7 C), resp. rate (!) 31, height 6' (1.829 m), weight 93.1 kg,  SpO2 99 %.    Vent Mode: PRVC FiO2 (%):  [30 %] 30 % Set Rate:  [30 bmp] 30 bmp Vt Set:  [620 mL] 620 mL PEEP:  [5 cmH20] 5 cmH20 Plateau Pressure:  [20 cmH20-24 cmH20] 20 cmH20   Intake/Output Summary (Last 24 hours) at 07/29/2020 1516 Last data filed at 07/29/2020 2353 Gross per 24 hour  Intake 1276.25 ml  Output 2275 ml  Net -998.75 ml   Filed Weights   07/27/20 0500 07/28/20 0200 07/29/20 0500  Weight: 94.8 kg 94.1 kg 93.1 kg      Examination: Constitutional: no acute distress  Eyes: not tracking, pupils equal Ears, nose, mouth, and throat: ETT in place Cardiovascular: RRR, ext warm Respiratory: Diminished at bases, triggering vent, tolerating pressures support Gastrointestinal: hypoactive BS, slightly distended Skin: No rashes, normal turgor Neurologic: opens eyes and turns head to voice.  [place] localizes to pain and mouth "ow", not following commands.  Resolved Hospital Problem list   NA  Assessment & Plan:  OOH Vfib arrest , Ischemic Cardiomyopathy- s/p PCI w/ DES placement Anoxic encephalopathy-showing signs of cortical recovery. Acute hypoxemic respiratory failure on vent secondary to cardiac arrest and aspiration AKI- stable HTN- worse after sedation wean.  Improving with treatment Constipation  Plan:  -On carvedilol, hydralazine and nitrate-will add losartan.  Watch creatinine -Continue atorvastatin and ASA/Brilinta for secondary prevention - Continue vent support, mental status precludes extubation - Appreciate neuro input, guarded prognosis but some cortical signs on exam suggest that he may have  some improvement over time -Keep off all sedation -Consider tracheostomy PEG tube midweek if not improving further.  May require up to 3 months to see best neurological recovery.  Best practice (evaluated daily)   Diet: continue TF Pain/Anxiety/Delirium protocol (if indicated): Off. VAP protocol (if indicated): Yes DVT prophylaxis: heparin GI  prophylaxis: Yes Glucose control: Sliding scale insulin Mobility: Bedrest Family Communication: Updated family today in meeting with palliative care.  Informed them that the patient is showing some signs of neurological improvement but that full neurological recovery is not guaranteed. Code Status: Full Disposition: ICU   CRITICAL CARE Performed by: Kipp Brood   Total critical care time: 40 minutes  Critical care time was exclusive of separately billable procedures and treating other patients.  Critical care was necessary to treat or prevent imminent or life-threatening deterioration.  Critical care was time spent personally by me on the following activities: development of treatment plan with patient and/or surrogate as well as nursing, discussions with consultants, evaluation of patient's response to treatment, examination of patient, obtaining history from patient or surrogate, ordering and performing treatments and interventions, ordering and review of laboratory studies, ordering and review of radiographic studies, pulse oximetry, re-evaluation of patient's condition and participation in multidisciplinary rounds.  Kipp Brood, MD [facility] ICU Physician [place]  Pager: [phone] Mobile: [phone] After hours: [phone].

## 2020-07-30 ENCOUNTER — Inpatient Hospital Stay (HOSPITAL_COMMUNITY): Payer: Medicaid Other

## 2020-07-30 DIAGNOSIS — Z9289 Personal history of other medical treatment: Secondary | ICD-10-CM

## 2020-07-30 LAB — CBC
HCT: 29.6 % — ABNORMAL LOW (ref 39.0–52.0)
Hemoglobin: 9.7 g/dL — ABNORMAL LOW (ref 13.0–17.0)
MCH: 33.4 pg (ref 26.0–34.0)
MCHC: 32.8 g/dL (ref 30.0–36.0)
MCV: 102.1 fL — ABNORMAL HIGH (ref 80.0–100.0)
Platelets: 250 10*3/uL (ref 150–400)
RBC: 2.9 MIL/uL — ABNORMAL LOW (ref 4.22–5.81)
RDW: 13.4 % (ref 11.5–15.5)
WBC: 15.1 10*3/uL — ABNORMAL HIGH (ref 4.0–10.5)
nRBC: 0 % (ref 0.0–0.2)

## 2020-07-30 LAB — BASIC METABOLIC PANEL
Anion gap: 11 (ref 5–15)
BUN: 49 mg/dL — ABNORMAL HIGH (ref 6–20)
CO2: 19 mmol/L — ABNORMAL LOW (ref 22–32)
Calcium: 9.1 mg/dL (ref 8.9–10.3)
Chloride: 112 mmol/L — ABNORMAL HIGH (ref 98–111)
Creatinine, Ser: 1.63 mg/dL — ABNORMAL HIGH (ref 0.61–1.24)
GFR, Estimated: 49 mL/min — ABNORMAL LOW (ref >60)
Glucose, Bld: 289 mg/dL — ABNORMAL HIGH (ref 70–99)
Potassium: 4.4 mmol/L (ref 3.5–5.1)
Sodium: 142 mmol/L (ref 135–145)

## 2020-07-30 LAB — GLUCOSE, CAPILLARY
Glucose-Capillary: 198 mg/dL — ABNORMAL HIGH (ref 70–99)
Glucose-Capillary: 224 mg/dL — ABNORMAL HIGH (ref 70–99)
Glucose-Capillary: 243 mg/dL — ABNORMAL HIGH (ref 70–99)
Glucose-Capillary: 251 mg/dL — ABNORMAL HIGH (ref 70–99)
Glucose-Capillary: 275 mg/dL — ABNORMAL HIGH (ref 70–99)
Glucose-Capillary: 277 mg/dL — ABNORMAL HIGH (ref 70–99)

## 2020-07-30 LAB — MAGNESIUM: Magnesium: 2.3 mg/dL (ref 1.7–2.4)

## 2020-07-30 LAB — PHOSPHORUS: Phosphorus: 3.7 mg/dL (ref 2.5–4.6)

## 2020-07-30 IMAGING — DX DG CHEST 1V PORT
1 series · 1 of 1 positions shown · non-contrast
Comparison: [DATE]

CLINICAL DATA: Respiratory distress

EXAM:
PORTABLE CHEST 1 VIEW

[chest ap]
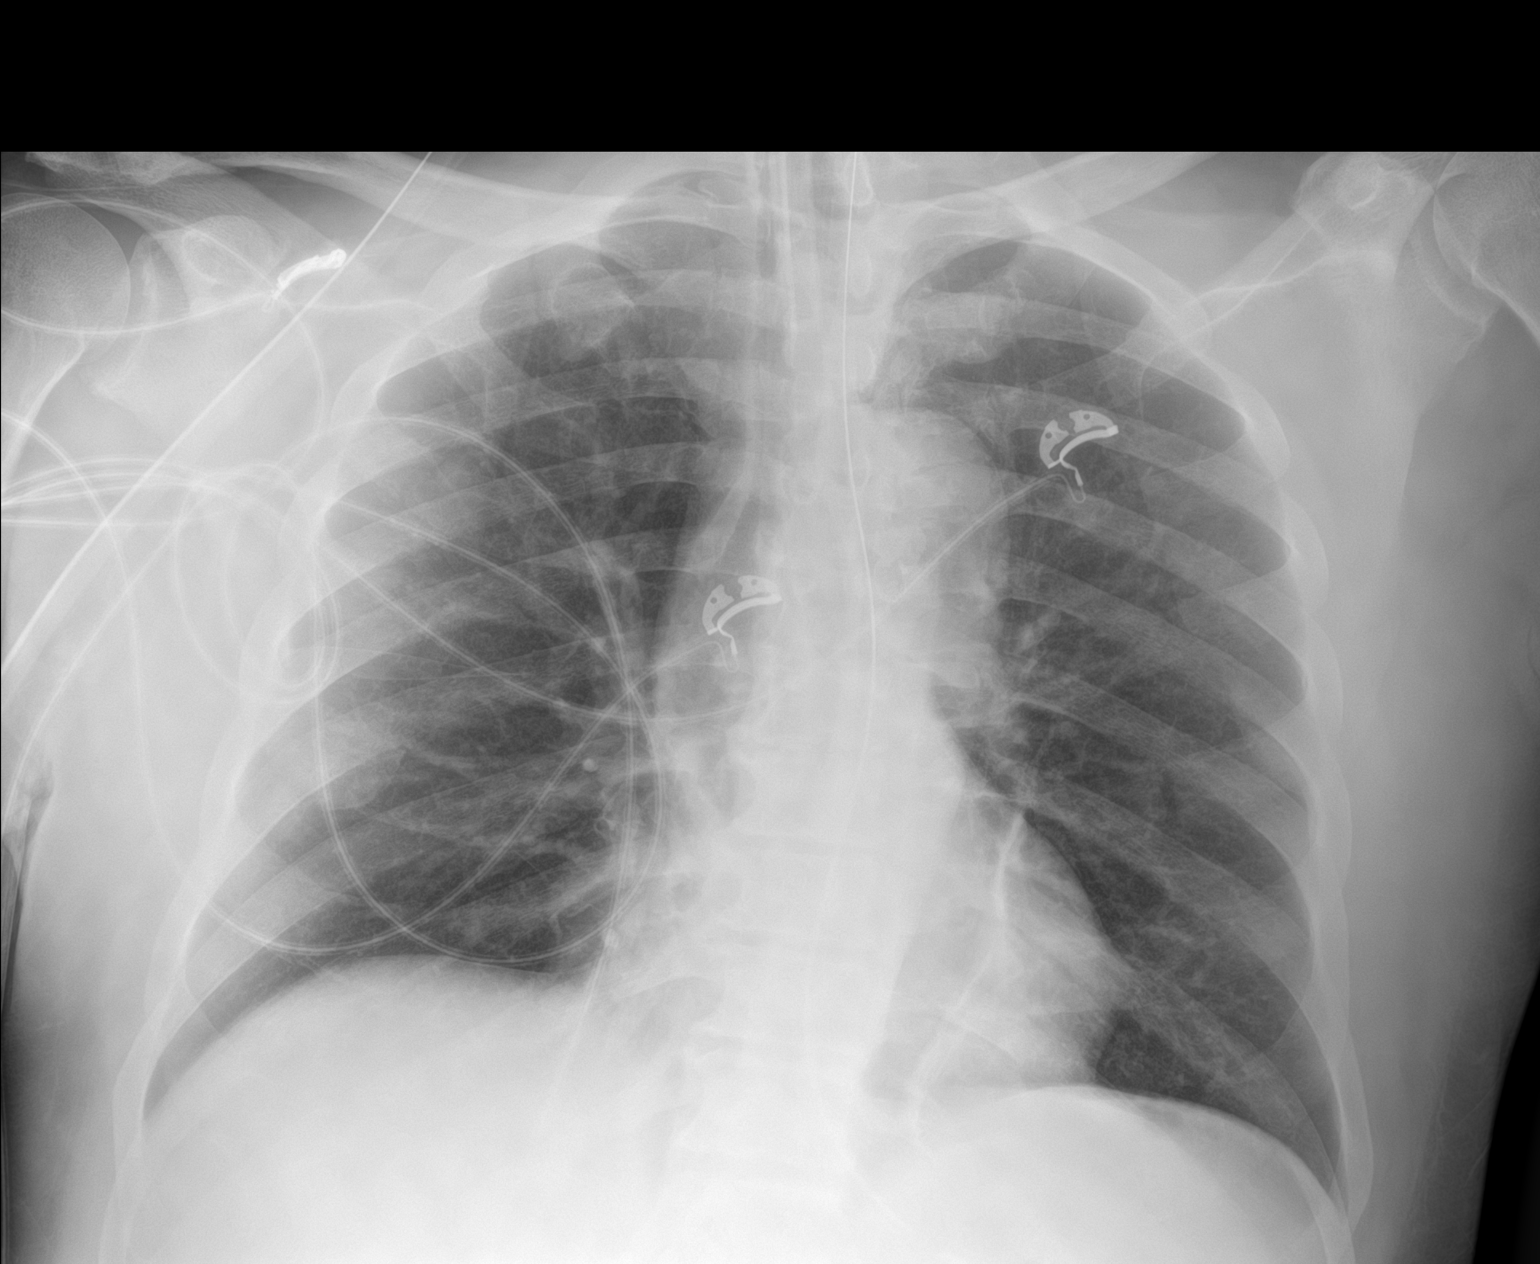

[1 of 1 positions shown; findings below may reference images not displayed]

FINDINGS: Endotracheal tube and NG tube remain in place, unchanged. Improved
aeration in the lung bases. Residual linear atelectasis in the
medial left lung base. No confluent opacity on the right. No
effusions.
IMPRESSION: Improving aeration in the lower lobes. Residual left base
atelectasis.

## 2020-07-30 MED ORDER — FENTANYL CITRATE (PF) 100 MCG/2ML IJ SOLN
50.0000 ug | INTRAMUSCULAR | Status: DC | PRN
  Administered 2020-07-30 – 2020-07-31 (×7): 100 ug via INTRAVENOUS
  Filled 2020-07-30 (×7): qty 2

## 2020-07-30 MED ORDER — DEXMEDETOMIDINE HCL IN NACL 400 MCG/100ML IV SOLN
0.4000 ug/kg/h | INTRAVENOUS | Status: DC
  Administered 2020-07-30: 22:00:00 0.4 ug/kg/h via INTRAVENOUS
  Administered 2020-07-31: 08:00:00 1 ug/kg/h via INTRAVENOUS
  Administered 2020-07-31: 03:00:00 0.8 ug/kg/h via INTRAVENOUS
  Filled 2020-07-30 (×3): qty 100

## 2020-07-30 MED ORDER — MIDAZOLAM HCL 2 MG/2ML IJ SOLN
2.0000 mg | Freq: Once | INTRAMUSCULAR | Status: AC
  Administered 2020-07-30: 04:00:00 2 mg via INTRAVENOUS
  Filled 2020-07-30: qty 2

## 2020-07-30 MED ORDER — INSULIN ASPART 100 UNIT/ML ~~LOC~~ SOLN
4.0000 [IU] | SUBCUTANEOUS | Status: DC
  Administered 2020-07-30 – 2020-07-31 (×4): 4 [IU] via SUBCUTANEOUS

## 2020-07-30 MED ORDER — DEXMEDETOMIDINE HCL IN NACL 400 MCG/100ML IV SOLN
0.4000 ug/kg/h | INTRAVENOUS | Status: DC
  Administered 2020-07-30: 08:00:00 0.8 ug/kg/h via INTRAVENOUS
  Administered 2020-07-30 (×2): 0.6 ug/kg/h via INTRAVENOUS
  Filled 2020-07-30 (×3): qty 100

## 2020-07-30 NOTE — Progress Notes (Signed)
eLink Physician-Brief Progress Note Patient Name: Antonio Navarro DOB: 1961/06/26 MRN: [id number]   Date of Service  07/30/2020  HPI/Events of Note  Agitation - Requiring Fentanyl 100 mcg Q 1 hour PRN.   eICU Interventions  Plan: 1. Precedex IV infusion. Titrate to RASS = 0.      Intervention Category Major Interventions: Delirium, psychosis, severe agitation - evaluation and management  [name] 07/30/2020, 10:15 PM

## 2020-07-30 NOTE — Progress Notes (Signed)
   Palliative Medicine Inpatient Follow Up Note   Reason for Consultation: Establishing goals of care and Psychosocial/spiritual support  HPI/Patient Profile: 59 y.o. male   admitted on 07/22/2020 with PMH for hypertension who presented on 12/4 with V. fib arrest in the setting of STEMI.   Patient currently critically ill in the ICU, he remains intubated.  Severediffuse encephalopathy  Severe ischemic cardiomyopathy, severe anoxic encephalopathy.  Improving acute kidney injury, improving hypernatremia.  Long term prognosis is guarded  Patient and his family face treatment option decisions, advanced directive decisions, and anticipatory care needs. (LTAC versus comfort care allowing for a natural death.)  Today's Discussion (August 23, 2020): Chart reviewed. I met with patients bedside RN this morning who shares with me that the patient does open his eyes though is not noted to be tracking or meaningfully following commands. Upon my assessment he will open his eyes to name when called though beyond that he is not following commands.  As of yesterday plan from medical team and families identification was to continue present course to identify if any further neurological recoveries could be made in the oncoming six months.  Palliative care will continue to incrementally follow though at this time the goals of the family are full aggressive care.  Objective Assessment: Vital Signs Vitals:   2020-08-23 0746 2020-08-23 0750  BP:  97/64  Pulse:  78  Resp:    Temp: 97.9 F (36.6 C)   SpO2:      Intake/Output Summary (Last 24 hours) at 23-Aug-2020 0855 Last data filed at 08/23/2020 0700 Gross per 24 hour  Intake 1092.1 ml  Output 1005 ml  Net 87.1 ml   Last Weight  Most recent update: 23-Aug-2020  4:15 AM   Weight  92.1 kg (203 lb 0.7 oz)           Physical Exam Constitutional:      Appearance: He is overweight. He is ill-appearing.     Interventions: He is intubated.   Cardiovascular:     Rate and Rhythm: Normal rate.  Pulmonary:     Effort: He is intubated.  Skin:    General: Skin is warm and dry.  Neurological:     Cranial Nerves: Cranial nerve deficit present.  SUMMARY OF RECOMMENDATIONS   Full Code / Full Scope of Care  Family in agreement with a Tracheostomy and PEG tube if it is determined these are needed later in the week  Plan for time to see if patient can gain neurological recovery  PMT will continue to incrementally follow along given clear goals of full aggressive care  Time Spent: 15 Greater than 50% of the time was spent in counseling and coordination of care ______________________________________________________________________________________ [place] Team Team Cell Phone: [phone] Please utilize secure chat with additional questions, if there is no response within 30 minutes please call the above phone number  Palliative Medicine Team providers are available by phone from 7am to 7pm daily and can be reached through the team cell phone.  Should this patient require assistance outside of these hours, please call the patient's attending physician.

## 2020-07-30 NOTE — Progress Notes (Signed)
NAME:  Antonio Navarro, MRN:  [id number], DOB:  [birth year], LOS: 80 ADMISSION DATE:  07/22/2020, CONSULTATION DATE: 07/22/2020 REFERRING MD: Dr. Roxanne Mins, CHIEF COMPLAINT: Status post arrest  Brief History   Patient is 59 year old male status post arrest  History of present illness   Patient is a 59 year old male brought to the emergency room after out-of-hospital cardiac arrest.  His wife apparently noticed that while he was sleeping he had agonal breathing and called 911.  He was without resuscitative effort for about 9 minutes.  Patient was found to be apneic and pulls list with initial rhythm of ventricular fibrillation.  He was intubated with [employer] airway and received 6 cardioversion attempts.  He was loaded with amiodarone and got a total of 6 rounds of 1 mg epinephrine.  He had return of spontaneous circulation.  ER physician estimates that time of CPR efforts were 30 minutes.  [facility] airway was replaced in the emergency room. At time of dictation patient is being taken to the cardiac Cath Lab.  On return of sinus rhythm the patient was found to have ST changes in the posterior and inferior leads.  Laboratory show an anion gap of 19 bicarb of 15 lactic acid is pending, glucose is 286, creatinine is 1.65 GFR estimated 45.  Past Medical History   . Hypertension   . Kidney stones      Significant Hospital Events   12/4-DES to 95% circumflex stenosis  Consults:  Cardiology, PCCM  Procedures:  As above  Significant Diagnostic Tests:  12/4 CT head>>no acute findings 12/5 Echo>>EF 98-33%, grade 1 diastolic dysfunction 82/5 EEG>> diffuse encephalopathy but no seizure activity. Micro Data:  12/4 Covid and flu>>negative 12/4 MRSA>>neg 12/4 Resp culture>>  Antimicrobials:  Ceftriaxone 12/4-  Interim history/subjective:  Increasing periods of agitation requiring initiation of dexmedetomidine.  Following this his blood pressure has dropped off.  Hydralazine and nitrates have been  stopped.  Objective   Blood pressure (!) 81/49, pulse 72, temperature 98.1 F (36.7 C), resp. rate (!) 30, height 6' (1.829 m), weight 92.1 kg, SpO2 99 %.    Vent Mode: PRVC FiO2 (%):  [30 %] 30 % Set Rate:  [0 bmp-30 bmp] 30 bmp Vt Set:  [620 mL] 620 mL PEEP:  [5 cmH20] 5 cmH20 Plateau Pressure:  [17 cmH20-28 cmH20] 23 cmH20   Intake/Output Summary (Last 24 hours) at 07/30/2020 1714 Last data filed at 07/30/2020 1400 Gross per 24 hour  Intake 1552.73 ml  Output 1365 ml  Net 187.73 ml   Filed Weights   07/28/20 0200 07/29/20 0500 07/30/20 0414  Weight: 94.1 kg 93.1 kg 92.1 kg      Examination: Constitutional: no acute distress  Eyes: not tracking, pupils equal Ears, nose, mouth, and throat: ETT in place Cardiovascular: RRR, ext warm Respiratory: Diminished at bases, triggering vent, tolerating pressures support Gastrointestinal: hypoactive BS, slightly distended Skin: No rashes, normal turgor Neurologic: opens eyes and turns head to voice.  [place] localizes to pain and mouth "ow", not following commands.  Resolved Hospital Problem list   NA  Assessment & Plan:  OOH Vfib arrest , Ischemic Cardiomyopathy- s/p PCI w/ DES placement Anoxic encephalopathy-showing signs of cortical recovery. Acute hypoxemic respiratory failure on vent secondary to cardiac arrest and aspiration AKI- stable HTN- worse after sedation wean.  Improving with treatment Constipation  Plan:  -Keep on carvedilol stop the remainder of his heart failure therapy. -Stop dexmedetomidine which may be significantly affecting blood pressure, use fentanyl for  periods of agitation. -Continue atorvastatin and ASA/Brilinta for secondary prevention - Continue vent support, mental status precludes extubation - Appreciate neuro input, guarded prognosis but some cortical signs on exam suggest that he may have some improvement over time.  Increasing limb movement also consistent with improving neurological -Keep  off all sedation -Consider tracheostomy PEG tube midweek if not improving further.  May require up to 3 months to see best neurological recovery.  Best practice (evaluated daily)   Diet: continue TF Pain/Anxiety/Delirium protocol (if indicated): Off. VAP protocol (if indicated): Yes DVT prophylaxis: heparin GI prophylaxis: Yes Glucose control: Sliding scale insulin, will increase tube feed Mobility: Bedrest Family Communication: Updated family today in meeting with palliative care.  Informed them that the patient is showing some signs of neurological improvement but that full neurological recovery is not guaranteed. Code Status: Full Disposition: ICU   CRITICAL CARE Performed by: Kipp Brood   Total critical care time: 46minutes  Critical care time was exclusive of separately billable procedures and treating other patients.  Critical care was necessary to treat or prevent imminent or life-threatening deterioration.  Critical care was time spent personally by me on the following activities: development of treatment plan with patient and/or surrogate as well as nursing, discussions with consultants, evaluation of patient's response to treatment, examination of patient, obtaining history from patient or surrogate, ordering and performing treatments and interventions, ordering and review of laboratory studies, ordering and review of radiographic studies, pulse oximetry, re-evaluation of patient's condition and participation in multidisciplinary rounds.  Kipp Brood, MD [facility] ICU Physician [place]  Pager: [phone] Mobile: [phone] After hours: [phone].

## 2020-07-30 NOTE — Progress Notes (Signed)
Dr. Lynetta Mare notified of pt's BP trend from SBP 77-81.  Order received to d/c norvasc and ok that I took off NTG patch.  NTG patch discarded in sharps box.  Will continue to closely monitor.

## 2020-07-30 NOTE — Progress Notes (Signed)
Progress Note  Patient Name: Antonio Navarro Date of Encounter: 07/30/2020  [facility] Cardiologist: Fransico Him, MD   Subjective   Sedated, intubated. Involuntary eyes opening. Doesn't follow commands.  Inpatient Medications    Scheduled Meds: . amLODipine  5 mg Per Tube Daily  . aspirin  81 mg Per Tube Daily  . atorvastatin  80 mg Per Tube Daily  . carvedilol  25 mg Per Tube BID WC  . chlorhexidine gluconate (MEDLINE KIT)  15 mL Mouth Rinse BID  . Chlorhexidine Gluconate Cloth  6 each Topical Daily  . feeding supplement (PROSource TF)  45 mL Per Tube QID  . free water  200 mL Per Tube Q4H  . heparin  5,000 Units Subcutaneous Q8H  . hydrALAZINE  25 mg Per Tube Q8H  . insulin aspart  0-20 Units Subcutaneous Q4H  . insulin glargine  12 Units Subcutaneous BID  . losartan  25 mg Per Tube Daily  . mouth rinse  15 mL Mouth Rinse 10 times per day  . nitroGLYCERIN  0.4 mg Transdermal Daily  . pantoprazole sodium  40 mg Per Tube QHS  . polyvinyl alcohol  1 drop Both Eyes QID  . sodium chloride flush  3 mL Intravenous Q12H  . ticagrelor  90 mg Per Tube BID   Continuous Infusions: . sodium chloride    . sodium chloride Stopped (07/23/20 0948)  . sodium chloride Stopped (07/22/20 0955)  . dexmedetomidine (PRECEDEX) IV infusion 0.8 mcg/kg/hr (07/30/20 0743)  . feeding supplement (VITAL 1.5 CAL) 60 mL/hr at 07/30/20 0700   PRN Meds: sodium chloride, acetaminophen, chlorproMAZINE, docusate, fentaNYL (SUBLIMAZE) injection, metoprolol tartrate, polyethylene glycol, sodium chloride flush   Vital Signs    Vitals:   07/30/20 0645 07/30/20 0700 07/30/20 0746 07/30/20 0750  BP: 95/64 100/63  97/64  Pulse: 75 74  78  Resp: (!) 30 (!) 30    Temp:   97.9 F (36.6 C)   TempSrc:   Oral   SpO2: 98% 99%    Weight:      Height:        Intake/Output Summary (Last 24 hours) at 07/30/2020 0826 Last data filed at 07/30/2020 0700 Gross per 24 hour  Intake 1092.1 ml  Output 1005  ml  Net 87.1 ml   Last 3 Weights 07/30/2020 07/29/2020 07/28/2020  Weight (lbs) 203 lb 0.7 oz 205 lb 4 oz 207 lb 7.3 oz  Weight (kg) 92.1 kg 93.1 kg 94.1 kg      Telemetry    SR with ventricular rates 70-100, PVCs - Personally Reviewed  ECG    No AM EKG - Personally Reviewed  Physical Exam   General: Intubated, sedated.  Neck: No JVD Lungs:Clear bilaterally Cardiovascular: Regular rate and rhythm. No murmurs, gallops or rubs. Abdomen:Soft. Bowel sounds present.  Extremities: No lower extremity edema.   Labs    High Sensitivity Troponin:   Recent Labs  Lab 07/22/20 0418 07/22/20 1220  TROPONINIHS 818* 24,533*      Chemistry Recent Labs  Lab 07/25/20 0343 07/26/20 0526 07/27/20 0318 07/28/20 0009 07/29/20 0051 07/30/20 0103  NA 139 140 144 143 143 142  K 3.7 3.5 3.3* 3.8 4.1 4.4  CL 110 110 111 112* 112* 112*  CO2 19* 18* 21* 21* 19* 19*  GLUCOSE 191* 171* 172* 198* 308* 289*  BUN 28* 30* 36* 37* 45* 49*  CREATININE 1.56* 1.51* 1.61* 1.66* 1.62* 1.63*  CALCIUM 8.2* 8.4* 8.8* 8.2* 9.0 9.1  PROT  5.7* 5.9* 6.6  --   --   --   ALBUMIN 2.4* 2.3* 2.6*  --   --   --   AST 71* 66* 73*  --   --   --   ALT 47* 43 52*  --   --   --   ALKPHOS 63 105 161*  --   --   --   BILITOT 1.4* 1.4* 1.7*  --   --   --   GFRNONAA 51* 53* 49* 47* 49* 49*  ANIONGAP _0 Hematology Recent Labs  Lab 07/28/20 0009 07/29/20 0051 07/30/20 0103  WBC 10.8* 13.5* 15.1*  RBC 2.83* 2.98* 2.90*  HGB 9.2* 9.7* 9.7*  HCT 28.7* 29.9* 29.6*  MCV 101.4* 100.3* 102.1*  MCH 32.5 32.6 33.4  MCHC 32.1 32.4 32.8  RDW 13.5 13.4 13.4  PLT 206 236 250    BNPNo results for input(s): BNP, PROBNP in the last 168 hours.   DDimer No results for input(s): DDIMER in the last 168 hours.   Radiology    DG CHEST PORT 1 VIEW  Result Date: 07/30/2020 CLINICAL DATA:  Respiratory distress EXAM: PORTABLE CHEST 1 VIEW COMPARISON:  07/28/2020 FINDINGS: Endotracheal tube and NG tube  remain in place, unchanged. Improved aeration in the lung bases. Residual linear atelectasis in the medial left lung base. No confluent opacity on the right. No effusions. IMPRESSION: Improving aeration in the lower lobes. Residual left base atelectasis. Electronically Signed   By: Rolm Baptise M.D.   On: 07/30/2020 03:52    Cardiac Studies   Echo 07/23/20: 1. Left ventricular ejection fraction, by estimation, is 40 to 45%. The  left ventricle has mildly decreased function. Left ventricular endocardial  border not optimally defined to evaluate regional wall motion, but  regional wall motion abnormalities are  present. There is mild left ventricular hypertrophy. Left ventricular  diastolic parameters are consistent with Grade I diastolic dysfunction  (impaired relaxation).  2. Right ventricular systolic function is mildly reduced. The right  ventricular size is normal. Tricuspid regurgitation signal is inadequate  for assessing PA pressure.  3. The aortic valve is tricuspid. Aortic valve regurgitation is not  visualized. No aortic stenosis is present.  4. The mitral valve is grossly normal. No evidence of mitral valve  regurgitation. No evidence of mitral stenosis.  5. The inferior vena cava is normal in size with <50% respiratory  variability, suggesting right atrial pressure of 8 mmHg.    Patient Profile     59 y.o. male with history of tobacco abuse, obesity, HTN who was admitted 07/22/20 post cardiac arrest with inferolateral STEMI. He was resuscitated in the field by EMS after being found to be in V fib. He was intubated on arrival. Cardiac cath with 95% Circumflex stenosis treated with a drug eluting stent. LvEF=40-45% by echo 07/23/20.    Assessment & Plan    1. Cardiac arrest secondary to acute MI: s/p stenting of the Circumflex. He has completed the cooling protocol. Hemodynamically stable. Awaiting neurological recovery. Overall poor prognosis. Continue ASA, Brilinta, beta  blocker and statin.   Followed by palliative care.   2. Ischemic cardiomyopathy: LVEF= 40-45%. Continue beta blocker. Will not start an Ace-inh or ARB given his renal insufficiency. Continue hydralazine  3. Hypoxic respiratory failure/Anoxic encephalopathy: PCCM and Neurology following.   4. Acute systolic and diastolic CHF, no signs of fluid overload, diuresing well, Crea stable at 1.6,  Off pressors  since 12/10, negative fluid balance, weight down.  Poor prognosis from neuro standpoint. Family is considering tracheostomy and LTAC. No new cardiac recommendations today.   For questions or updates, please contact [facility] Please consult [email or web address] for contact info under    Signed,  , MD   

## 2020-07-30 NOTE — Plan of Care (Signed)
  Problem: Clinical Measurements: Goal: Will remain free from infection Outcome: Progressing Goal: Diagnostic test results will improve Outcome: Progressing Goal: Respiratory complications will improve Outcome: Progressing Goal: Cardiovascular complication will be avoided Outcome: Progressing Note: Vital signs remain WNL   Problem: Nutrition: Goal: Adequate nutrition will be maintained Outcome: Progressing Note: Patient tolerating tube feeding.    Problem: Elimination: Goal: Will not experience complications related to urinary retention Outcome: Progressing Note: Foley catheter remains patent.    Problem: Safety: Goal: Ability to remain free from injury will improve Outcome: Progressing   Problem: Cardiac: Goal: Ability to achieve and maintain adequate cardiopulmonary perfusion will improve Outcome: Progressing   Problem: Neurologic: Goal: Promote progressive neurologic recovery Outcome: Progressing   Problem: Skin Integrity: Goal: Risk for impaired skin integrity will be minimized. Outcome: Progressing   Problem: Education: Goal: Knowledge of General Education information will improve Description: Including pain rating scale, medication(s)/side effects and non-pharmacologic comfort measures Outcome: Not Progressing Note: Unable to assess orientation of patient and ability to learn.   Problem: Health Behavior/Discharge Planning: Goal: Ability to manage health-related needs will improve Outcome: Not Progressing   Problem: Clinical Measurements: Goal: Ability to maintain clinical measurements within normal limits will improve Outcome: Not Progressing   Problem: Activity: Goal: Risk for activity intolerance will decrease Outcome: Not Progressing Note: Patient restless with repositioning. Unable to follow commands.    Problem: Coping: Goal: Level of anxiety will decrease Outcome: Not Progressing   Problem: Elimination: Goal: Will not experience complications  related to bowel motility Outcome: Not Progressing   Problem: Pain Managment: Goal: General experience of comfort will improve Outcome: Not Progressing   Problem: Skin Integrity: Goal: Risk for impaired skin integrity will decrease Outcome: Not Progressing Note: Staff to continue q2 hour turning of patient.    Problem: Education: Goal: Ability to manage disease process will improve Outcome: Not Progressing

## 2020-07-30 NOTE — Progress Notes (Signed)
eLink Physician-Brief Progress Note Patient Name: Antonio Navarro DOB: 1960-11-25 MRN: [id number]   Date of Service  07/30/2020  HPI/Events of Note  Patient awake with a terrified look and extremely dyssynchronous with the ventilator.  eICU Interventions  Versed 2 mg iv x 1, Precedex infusion ordered, PRN Fentanyl order increased to 50-100 mcg PRN.        Kerry Kass Zeplin [place] 07/30/2020, 3:53 AM

## 2020-07-31 ENCOUNTER — Inpatient Hospital Stay (HOSPITAL_COMMUNITY): Payer: Medicaid Other

## 2020-07-31 LAB — BASIC METABOLIC PANEL
Anion gap: 12 (ref 5–15)
BUN: 65 mg/dL — ABNORMAL HIGH (ref 6–20)
CO2: 19 mmol/L — ABNORMAL LOW (ref 22–32)
Calcium: 9 mg/dL (ref 8.9–10.3)
Chloride: 114 mmol/L — ABNORMAL HIGH (ref 98–111)
Creatinine, Ser: 2 mg/dL — ABNORMAL HIGH (ref 0.61–1.24)
GFR, Estimated: 38 mL/min — ABNORMAL LOW (ref >60)
Glucose, Bld: 212 mg/dL — ABNORMAL HIGH (ref 70–99)
Potassium: 4.4 mmol/L (ref 3.5–5.1)
Sodium: 145 mmol/L (ref 135–145)

## 2020-07-31 LAB — CBC
HCT: 27.6 % — ABNORMAL LOW (ref 39.0–52.0)
HCT: 34.8 % — ABNORMAL LOW (ref 39.0–52.0)
Hemoglobin: 8.5 g/dL — ABNORMAL LOW (ref 13.0–17.0)
Hemoglobin: 10.7 g/dL — ABNORMAL LOW (ref 13.0–17.0)
MCH: 32.2 pg (ref 26.0–34.0)
MCH: 32.5 pg (ref 26.0–34.0)
MCHC: 30.8 g/dL (ref 30.0–36.0)
MCHC: 30.7 g/dL (ref 30.0–36.0)
MCV: 104.5 fL — ABNORMAL HIGH (ref 80.0–100.0)
MCV: 105.8 fL — ABNORMAL HIGH (ref 80.0–100.0)
Platelets: 237 10*3/uL (ref 150–400)
Platelets: 310 10*3/uL (ref 150–400)
RBC: 2.64 MIL/uL — ABNORMAL LOW (ref 4.22–5.81)
RBC: 3.29 MIL/uL — ABNORMAL LOW (ref 4.22–5.81)
RDW: 13.3 % (ref 11.5–15.5)
RDW: 13.2 % (ref 11.5–15.5)
WBC: 17.1 10*3/uL — ABNORMAL HIGH (ref 4.0–10.5)
WBC: 24.4 10*3/uL — ABNORMAL HIGH (ref 4.0–10.5)
nRBC: 0.1 % (ref 0.0–0.2)
nRBC: 0 % (ref 0.0–0.2)

## 2020-07-31 LAB — GLUCOSE, CAPILLARY
Glucose-Capillary: 105 mg/dL — ABNORMAL HIGH (ref 70–99)
Glucose-Capillary: 116 mg/dL — ABNORMAL HIGH (ref 70–99)
Glucose-Capillary: 131 mg/dL — ABNORMAL HIGH (ref 70–99)
Glucose-Capillary: 140 mg/dL — ABNORMAL HIGH (ref 70–99)
Glucose-Capillary: 191 mg/dL — ABNORMAL HIGH (ref 70–99)
Glucose-Capillary: 237 mg/dL — ABNORMAL HIGH (ref 70–99)
Glucose-Capillary: 244 mg/dL — ABNORMAL HIGH (ref 70–99)

## 2020-07-31 LAB — PHOSPHORUS: Phosphorus: 3.9 mg/dL (ref 2.5–4.6)

## 2020-07-31 LAB — MAGNESIUM: Magnesium: 2.3 mg/dL (ref 1.7–2.4)

## 2020-07-31 IMAGING — DX DG ABDOMEN 1V
1 series · 1 of 1 positions shown · non-contrast
Comparison: None.

CLINICAL DATA: NG tube placement

EXAM:
ABDOMEN - 1 VIEW

[abdomen supine]
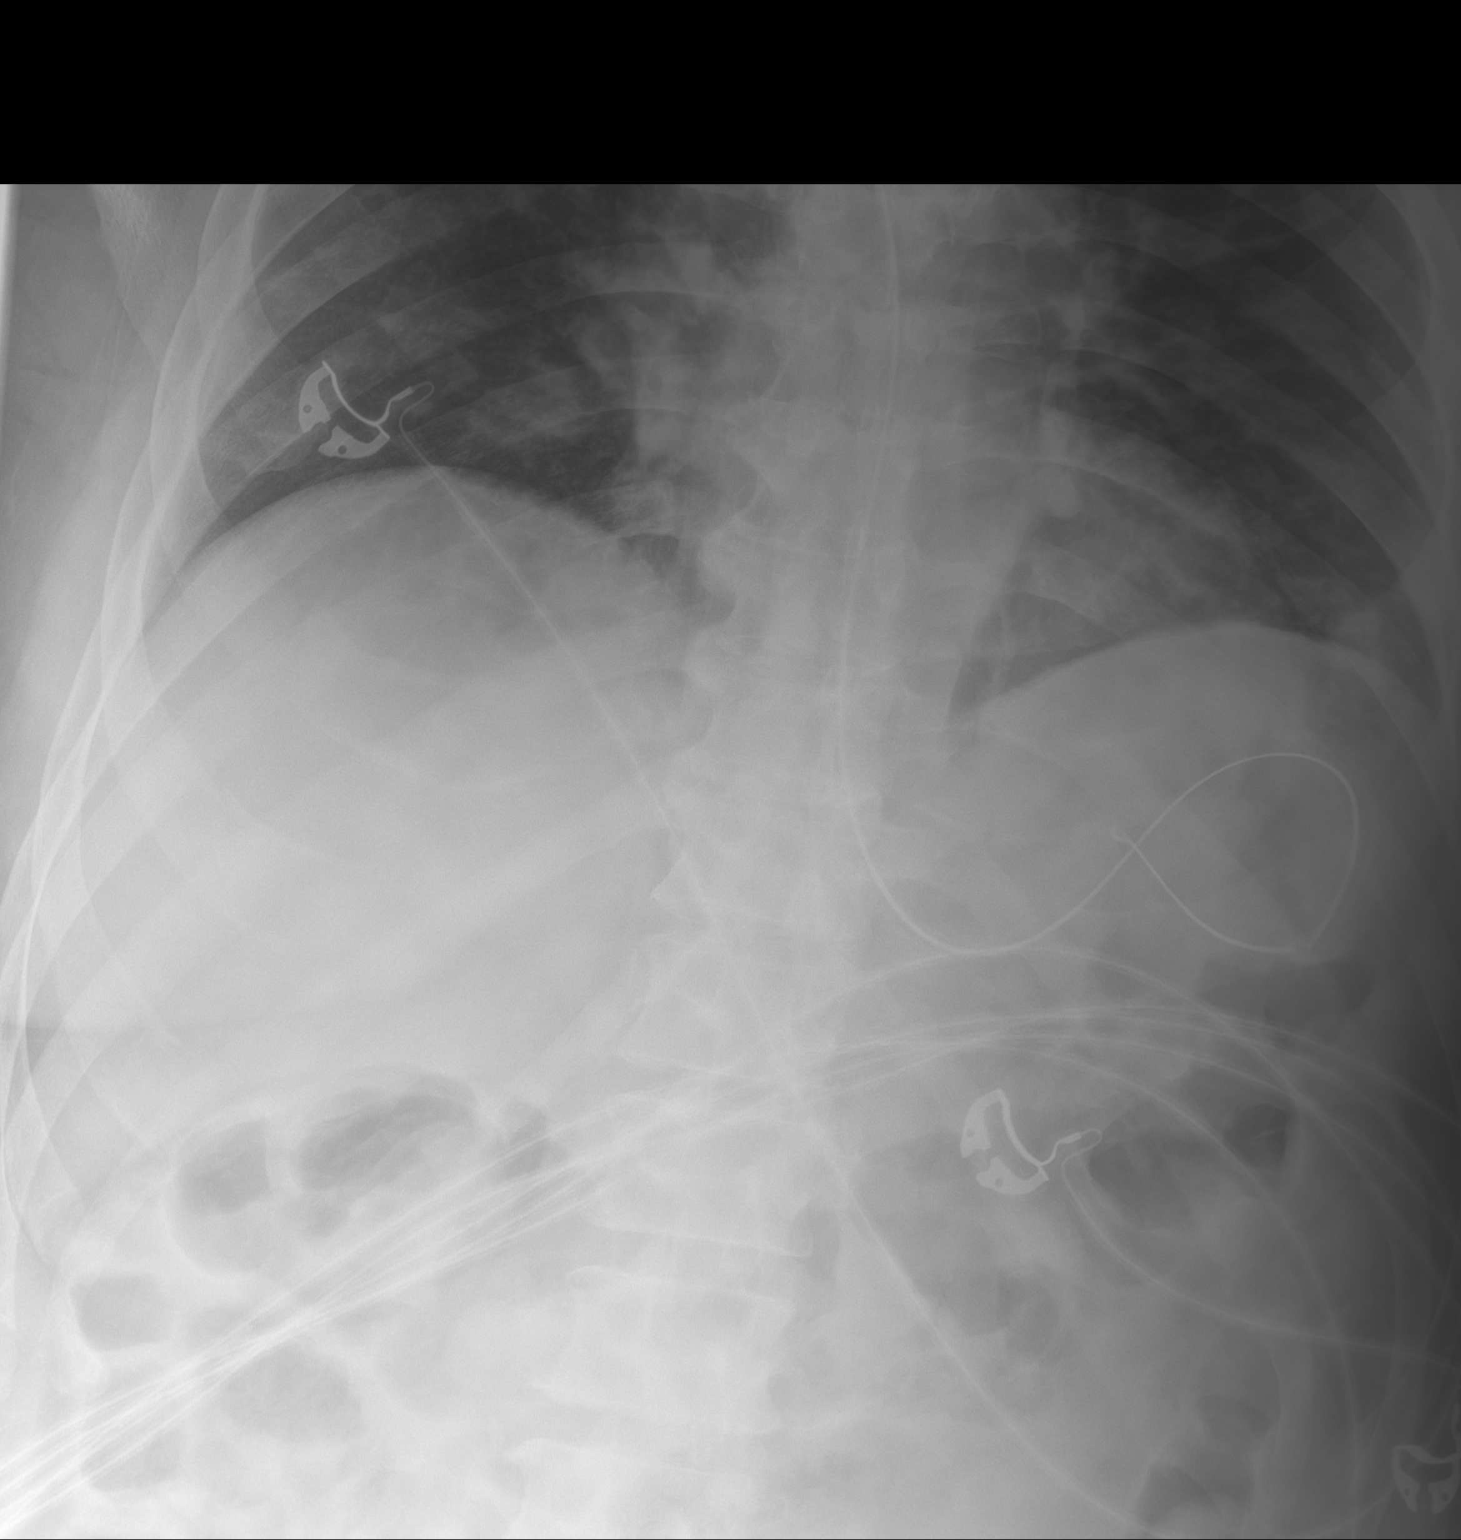

[1 of 1 positions shown; findings below may reference images not displayed]

FINDINGS: Esophageal tube is mildly looped within the region of gastric
fundus. Upper gas pattern is unremarkable
IMPRESSION: Esophageal tube tip overlies the gastric fundus.

## 2020-07-31 MED ORDER — INSULIN GLARGINE 100 UNIT/ML ~~LOC~~ SOLN
14.0000 [IU] | Freq: Two times a day (BID) | SUBCUTANEOUS | Status: DC
  Administered 2020-07-31 (×2): 14 [IU] via SUBCUTANEOUS
  Filled 2020-07-31 (×4): qty 0.14

## 2020-07-31 MED ORDER — CHLORHEXIDINE GLUCONATE 0.12 % MT SOLN
OROMUCOSAL | Status: AC
  Administered 2020-07-31: 20:00:00 15 mL via OROMUCOSAL
  Filled 2020-07-31: qty 15

## 2020-07-31 MED ORDER — LABETALOL HCL 5 MG/ML IV SOLN
10.0000 mg | Freq: Once | INTRAVENOUS | Status: AC
  Administered 2020-07-31: 17:00:00 10 mg via INTRAVENOUS
  Filled 2020-07-31: qty 4

## 2020-07-31 MED ORDER — HYDRALAZINE HCL 25 MG PO TABS
25.0000 mg | ORAL_TABLET | Freq: Three times a day (TID) | ORAL | Status: DC
  Administered 2020-07-31: 09:00:00 25 mg via ORAL
  Filled 2020-07-31: qty 1

## 2020-07-31 MED ORDER — HYDRALAZINE HCL 25 MG PO TABS
25.0000 mg | ORAL_TABLET | Freq: Three times a day (TID) | ORAL | Status: DC
  Administered 2020-07-31 – 2020-08-01 (×2): 25 mg
  Filled 2020-07-31 (×2): qty 1

## 2020-07-31 MED ORDER — ORAL CARE MOUTH RINSE
15.0000 mL | Freq: Two times a day (BID) | OROMUCOSAL | Status: DC
  Administered 2020-08-01 – 2020-11-24 (×155): 15 mL via OROMUCOSAL

## 2020-07-31 NOTE — Progress Notes (Signed)
Progress Note  Patient Name: Antonio Navarro Date of Encounter: 07/31/2020  [facility] Cardiologist: Fransico Him, MD   Subjective   Sedated on Precedex, intubated. Was agitated through the night.  Doesn't follow commands.  Inpatient Medications    Scheduled Meds: . aspirin  81 mg Per Tube Daily  . atorvastatin  80 mg Per Tube Daily  . carvedilol  25 mg Per Tube BID WC  . chlorhexidine gluconate (MEDLINE KIT)  15 mL Mouth Rinse BID  . Chlorhexidine Gluconate Cloth  6 each Topical Daily  . feeding supplement (PROSource TF)  45 mL Per Tube QID  . free water  200 mL Per Tube Q4H  . heparin  5,000 Units Subcutaneous Q8H  . insulin aspart  0-20 Units Subcutaneous Q4H  . insulin aspart  4 Units Subcutaneous Q4H  . insulin glargine  14 Units Subcutaneous BID  . mouth rinse  15 mL Mouth Rinse 10 times per day  . pantoprazole sodium  40 mg Per Tube QHS  . polyvinyl alcohol  1 drop Both Eyes QID  . sodium chloride flush  3 mL Intravenous Q12H  . ticagrelor  90 mg Per Tube BID   Continuous Infusions: . sodium chloride    . sodium chloride Stopped (07/23/20 0948)  . sodium chloride Stopped (07/22/20 0955)  . dexmedetomidine (PRECEDEX) IV infusion 1 mcg/kg/hr (07/31/20 0801)  . feeding supplement (VITAL 1.5 CAL) 60 mL/hr at 07/31/20 0600   PRN Meds: sodium chloride, acetaminophen, chlorproMAZINE, docusate, fentaNYL (SUBLIMAZE) injection, metoprolol tartrate, polyethylene glycol, sodium chloride flush   Vital Signs    Vitals:   07/31/20 0500 07/31/20 0600 07/31/20 0731 07/31/20 0808  BP: 118/79 105/67    Pulse: 73 70    Resp: (!) 30 (!) 30    Temp:    98.7 F (37.1 C)  TempSrc:    Oral  SpO2: 99% 100% 100%   Weight: 91.5 kg     Height:        Intake/Output Summary (Last 24 hours) at 07/31/2020 7425 Last data filed at 07/31/2020 0600 Gross per 24 hour  Intake 1709.38 ml  Output 1500 ml  Net 209.38 ml   Last 3 Weights 07/31/2020 07/30/2020 07/29/2020  Weight  (lbs) 201 lb 11.5 oz 203 lb 0.7 oz 205 lb 4 oz  Weight (kg) 91.5 kg 92.1 kg 93.1 kg      Telemetry    SR with ventricular rates 70-100, PVCs. 7 beat runs of NSVT x 2- Personally Reviewed  ECG    No AM EKG - Personally Reviewed  Physical Exam   General: Intubated, sedated.  Neck: No JVD Lungs:Clear bilaterally Cardiovascular: Regular rate and rhythm. No murmurs, gallops or rubs. Abdomen:Soft. Bowel sounds present.  Extremities: No lower extremity edema.   Labs    High Sensitivity Troponin:   Recent Labs  Lab 07/22/20 0418 07/22/20 1220  TROPONINIHS 818* 24,533*      Chemistry Recent Labs  Lab 07/25/20 0343 07/26/20 0526 07/27/20 0318 07/28/20 0009 07/29/20 0051 07/30/20 0103 07/31/20 0111  NA 139 140 144   < > 143 142 145  K 3.7 3.5 3.3*   < > 4.1 4.4 4.4  CL 110 110 111   < > 112* 112* 114*  CO2 19* 18* 21*   < > 19* 19* 19*  GLUCOSE 191* 171* 172*   < > 308* 289* 212*  BUN 28* 30* 36*   < > 45* 49* 65*  CREATININE 1.56* 1.51* 1.61*   < >  1.62* 1.63* 2.00*  CALCIUM 8.2* 8.4* 8.8*   < > 9.0 9.1 9.0  PROT 5.7* 5.9* 6.6  --   --   --   --   ALBUMIN 2.4* 2.3* 2.6*  --   --   --   --   AST 71* 66* 73*  --   --   --   --   ALT 47* 43 52*  --   --   --   --   ALKPHOS 63 105 161*  --   --   --   --   BILITOT 1.4* 1.4* 1.7*  --   --   --   --   GFRNONAA 51* 53* 49*   < > 49* 49* 38*  ANIONGAP _0 < > _1 < > = values in this interval not displayed.     Hematology Recent Labs  Lab 07/29/20 0051 07/30/20 0103 07/31/20 0111  WBC 13.5* 15.1* 17.1*  RBC 2.98* 2.90* 2.64*  HGB 9.7* 9.7* 8.5*  HCT 29.9* 29.6* 27.6*  MCV 100.3* 102.1* 104.5*  MCH 32.6 33.4 32.2  MCHC 32.4 32.8 30.8  RDW 13.4 13.4 13.3  PLT 236 250 237    BNPNo results for input(s): BNP, PROBNP in the last 168 hours.   DDimer No results for input(s): DDIMER in the last 168 hours.   Radiology    DG CHEST PORT 1 VIEW  Result Date: 07/30/2020 CLINICAL DATA:  Respiratory  distress EXAM: PORTABLE CHEST 1 VIEW COMPARISON:  07/28/2020 FINDINGS: Endotracheal tube and NG tube remain in place, unchanged. Improved aeration in the lung bases. Residual linear atelectasis in the medial left lung base. No confluent opacity on the right. No effusions. IMPRESSION: Improving aeration in the lower lobes. Residual left base atelectasis. Electronically Signed   By: Rolm Baptise M.D.   On: 07/30/2020 03:52    Cardiac Studies   Echo 07/23/20: 1. Left ventricular ejection fraction, by estimation, is 40 to 45%. The  left ventricle has mildly decreased function. Left ventricular endocardial  border not optimally defined to evaluate regional wall motion, but  regional wall motion abnormalities are  present. There is mild left ventricular hypertrophy. Left ventricular  diastolic parameters are consistent with Grade I diastolic dysfunction  (impaired relaxation).  2. Right ventricular systolic function is mildly reduced. The right  ventricular size is normal. Tricuspid regurgitation signal is inadequate  for assessing PA pressure.  3. The aortic valve is tricuspid. Aortic valve regurgitation is not  visualized. No aortic stenosis is present.  4. The mitral valve is grossly normal. No evidence of mitral valve  regurgitation. No evidence of mitral stenosis.  5. The inferior vena cava is normal in size with <50% respiratory  variability, suggesting right atrial pressure of 8 mmHg.    Patient Profile     59 y.o. male with history of tobacco abuse, obesity, HTN who was admitted 07/22/20 post cardiac arrest with inferolateral STEMI. He was resuscitated in the field by EMS after being found to be in V fib. Approximately 30 minutes of CPR. He was intubated on arrival. 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 No other obstructive disease.  LvEF=40-45% by echo 07/23/20.    Assessment & Plan    1. Cardiac arrest secondary to acute inferolateral  STEMI: s/p stenting of the mid Circumflex. He has completed the cooling protocol. Hemodynamically stable. Awaiting neurological recovery. Overall poor prognosis for  full functional recovery but some recovery noted by Neuro. Continue ASA, Brilinta, beta blocker and statin.   Followed by palliative care.   2. Ischemic cardiomyopathy: LVEF= 40-45%. Continue beta blocker. Will not start an Ace-inh or ARB given his renal insufficiency. Was getting IV hydralazine until 2 days ago. Will resume PO. I expect BP to go up once sedation decreased.   3. Hypoxic respiratory failure/Anoxic encephalopathy: PCCM and Neurology following.   4. Acute systolic and diastolic CHF, no signs of fluid overload, Crea up to 2.0. Negative fluid balance, weight down. Lasix on hold  Poor prognosis from neuro standpoint.  No new cardiac recommendations today.   For questions or updates, please contact [place] Please consult [email or web address] for contact info under    Signed, Breunna Nordmann [place], MD

## 2020-07-31 NOTE — Plan of Care (Signed)
  Problem: Clinical Measurements: Goal: Ability to maintain clinical measurements within normal limits will improve Outcome: Progressing Goal: Will remain free from infection Outcome: Progressing Goal: Diagnostic test results will improve Outcome: Progressing Goal: Respiratory complications will improve Outcome: Progressing Goal: Cardiovascular complication will be avoided Outcome: Progressing   Problem: Nutrition: Goal: Adequate nutrition will be maintained Outcome: Progressing   Problem: Elimination: Goal: Will not experience complications related to bowel motility Outcome: Progressing Goal: Will not experience complications related to urinary retention Outcome: Progressing   Problem: Pain Managment: Goal: General experience of comfort will improve Outcome: Progressing   Problem: Safety: Goal: Ability to remain free from injury will improve Outcome: Progressing   Problem: Skin Integrity: Goal: Risk for impaired skin integrity will decrease Outcome: Progressing   Problem: Education: Goal: Ability to manage disease process will improve Outcome: Progressing   Problem: Cardiac: Goal: Ability to achieve and maintain adequate cardiopulmonary perfusion will improve Outcome: Progressing   Problem: Neurologic: Goal: Promote progressive neurologic recovery Outcome: Progressing   Problem: Skin Integrity: Goal: Risk for impaired skin integrity will be minimized. Outcome: Progressing   Problem: Education: Goal: Knowledge of General Education information will improve Description: Including pain rating scale, medication(s)/side effects and non-pharmacologic comfort measures Outcome: Not Progressing   Problem: Health Behavior/Discharge Planning: Goal: Ability to manage health-related needs will improve Outcome: Not Progressing   Problem: Activity: Goal: Risk for activity intolerance will decrease Outcome: Not Progressing   Problem: Coping: Goal: Level of anxiety will  decrease Outcome: Not Progressing

## 2020-07-31 NOTE — Progress Notes (Signed)
eLink Physician-Brief Progress Note Patient Name: Antonio Navarro DOB: Jul 24, 1961 MRN: [id number]   Date of Service  07/31/2020  HPI/Events of Note  Nursing reports hematuria - last Hgb = 8.5. Patient is on Heparin [place] and Brilinta.   eICU Interventions  Plan: 1. CBC with platelets STAT.  2. Hold 10 PM dose of Heparin [place].      Intervention Category Major Interventions: Other:  Lysle Dingwall 07/31/2020, 9:17 PM

## 2020-07-31 NOTE — Procedures (Signed)
Extubation Procedure Note  Patient Details:   Name: Antonio Navarro DOB: 10-Mar-1961 MRN: [id number]   Airway Documentation:    Vent end date: 07/31/20 Vent end time: 0859   Evaluation  O2 sats: stable throughout Complications: No apparent complications Patient did tolerate procedure well. Bilateral Breath Sounds: Rhonchi   Yes  Placed on 4l/min [place] Unable to follow commands for Incentive spirometer   Revonda Standard 07/31/2020, 9:00 AM

## 2020-07-31 NOTE — Progress Notes (Signed)
NAME:  Antonio Navarro, MRN:  [id number], DOB:  1961-06-03, LOS: 9 ADMISSION DATE:  07/22/2020, CONSULTATION DATE: 07/22/2020 REFERRING MD: Dr. Roxanne Mins, CHIEF COMPLAINT: Status post arrest  Brief History   Patient is 59 year old male status post arrest.  Downtime 9 minutes.  Initial rhythm was V. fib, shocked and received 6 rounds of epi before ROSC achieved.  Total CPR time was 30 minutes.   Past Medical History   . Hypertension   . Kidney stones      Significant Hospital Events   12/4-DES to 95% circumflex stenosis  Consults:  Cardiology, PCCM  Procedures:  As above  Significant Diagnostic Tests:  12/4 CT head>>no acute findings 12/5 Echo>>EF 32-44%, grade 1 diastolic dysfunction 01/0 EEG>> diffuse encephalopathy but no seizure activity. Micro Data:  12/4 Covid and flu>>negative 12/4 MRSA>>neg 12/4 Resp culture>>  Antimicrobials:  Ceftriaxone 12/4-12/8  Interim history/subjective:  Patient was placed on pressure support trial, he was tolerating well.  Precedex was stopped he was successfully extubated this morning  Objective   Blood pressure (!) 154/75, pulse 66, temperature 98.7 F (37.1 C), temperature source Oral, resp. rate (!) 23, height 6' (1.829 m), weight 91.5 kg, SpO2 97 %.    Vent Mode: PSV;CPAP FiO2 (%):  [30 %-36 %] 36 % Set Rate:  [30 bmp] 30 bmp Vt Set:  [620 mL] 620 mL PEEP:  [5 cmH20] 5 cmH20 Pressure Support:  [10 cmH20] 10 cmH20 Plateau Pressure:  [22 cmH20-23 cmH20] 22 cmH20   Intake/Output Summary (Last 24 hours) at 07/31/2020 1156 Last data filed at 07/31/2020 1100 Gross per 24 hour  Intake 1716.76 ml  Output 1260 ml  Net 456.76 ml   Filed Weights   07/29/20 0500 07/30/20 0414 07/31/20 0500  Weight: 93.1 kg 92.1 kg 91.5 kg   Examination: Constitutional: Middle-age African-American male, lying on the bed  Eyes: not tracking, pupils equal, reactive to light Ears, nose, mouth, and throat Cardiovascular: RRR, ext warm Respiratory:  Diminished at bases, no wheezes or rhonchi Gastrointestinal: Nontender, nondistended, bowel sounds present Skin: No rashes, normal turgor Neurologic: opens eyes and turns head to voice, intermittently following simple commands.  Localizes painful stimuli.  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 Acute hypoxemic respiratory failure, successfully extubated Worsening AKI HTN Constipation  MRI brain confirmed hypoxic/anoxic cortical injury Continue aspirin, Brilinta and atorvastatin Cardiology input is appreciated Continue Coreg, can't to start him on ACE inhibitor due to worsening AKI Continue hydralazine, if his blood pressure tolerates we will add Imdur Precedex infusion was discontinued Patient was successfully extubated this morning, has a strong cough Patient serum creatinine is trending up, closely monitor Blood pressure is well controlled Continue aggressive bowel regimen Bedside swallow evaluation  Best practice (evaluated daily)   Diet: continue TF at bedside swallow evaluation Pain/Anxiety/Delirium protocol (if indicated): N/A VAP protocol (if indicated): N/A DVT prophylaxis: heparin GI prophylaxis: Yes Glucose control: Basal and bolus insulin Mobility: Bedrest Family Communication: Patient sister was updated over the phone Code Status: Full Disposition: ICU   Total critical care time: 39 minutes  Performed by: Crowder care time was exclusive of separately billable procedures and treating other patients.   Critical  care was necessary to treat or prevent imminent or life-threatening deterioration.   Critical care was time  spent personally by me on the following activities: development of treatment plan with patient and/or surrogate as well as nursing, discussions with consultants, evaluation of patient's response to treatment, examination of patient, obtaining history from patient or surrogate, ordering and performing treatments and interventions, ordering and review of laboratory studies, ordering and review of radiographic studies, pulse oximetry and re-evaluation of patient's condition.   Jacky Kindle MD [facility] Pager: [phone] Mobile: [phone]

## 2020-08-01 ENCOUNTER — Inpatient Hospital Stay (HOSPITAL_COMMUNITY): Payer: Medicaid Other

## 2020-08-01 DIAGNOSIS — I472 Ventricular tachycardia: Secondary | ICD-10-CM

## 2020-08-01 DIAGNOSIS — R1312 Dysphagia, oropharyngeal phase: Secondary | ICD-10-CM

## 2020-08-01 DIAGNOSIS — G931 Anoxic brain damage, not elsewhere classified: Secondary | ICD-10-CM

## 2020-08-01 LAB — BASIC METABOLIC PANEL
Anion gap: 10 (ref 5–15)
BUN: 50 mg/dL — ABNORMAL HIGH (ref 6–20)
CO2: 22 mmol/L (ref 22–32)
Calcium: 9.5 mg/dL (ref 8.9–10.3)
Chloride: 114 mmol/L — ABNORMAL HIGH (ref 98–111)
Creatinine, Ser: 1.57 mg/dL — ABNORMAL HIGH (ref 0.61–1.24)
GFR, Estimated: 51 mL/min — ABNORMAL LOW (ref >60)
Glucose, Bld: 141 mg/dL — ABNORMAL HIGH (ref 70–99)
Potassium: 4.7 mmol/L (ref 3.5–5.1)
Sodium: 146 mmol/L — ABNORMAL HIGH (ref 135–145)

## 2020-08-01 LAB — CBC
HCT: 33.1 % — ABNORMAL LOW (ref 39.0–52.0)
Hemoglobin: 10.8 g/dL — ABNORMAL LOW (ref 13.0–17.0)
MCH: 33.8 pg (ref 26.0–34.0)
MCHC: 32.6 g/dL (ref 30.0–36.0)
MCV: 103.4 fL — ABNORMAL HIGH (ref 80.0–100.0)
Platelets: 315 10*3/uL (ref 150–400)
RBC: 3.2 MIL/uL — ABNORMAL LOW (ref 4.22–5.81)
RDW: 13.1 % (ref 11.5–15.5)
WBC: 20.6 10*3/uL — ABNORMAL HIGH (ref 4.0–10.5)
nRBC: 0 % (ref 0.0–0.2)

## 2020-08-01 LAB — GLUCOSE, CAPILLARY
Glucose-Capillary: 130 mg/dL — ABNORMAL HIGH (ref 70–99)
Glucose-Capillary: 125 mg/dL — ABNORMAL HIGH (ref 70–99)
Glucose-Capillary: 145 mg/dL — ABNORMAL HIGH (ref 70–99)
Glucose-Capillary: 129 mg/dL — ABNORMAL HIGH (ref 70–99)
Glucose-Capillary: 140 mg/dL — ABNORMAL HIGH (ref 70–99)

## 2020-08-01 LAB — PHOSPHORUS: Phosphorus: 4.6 mg/dL (ref 2.5–4.6)

## 2020-08-01 LAB — MAGNESIUM: Magnesium: 2.3 mg/dL (ref 1.7–2.4)

## 2020-08-01 IMAGING — DX DG ABD PORTABLE 1V
1 series · 1 of 1 positions shown · non-contrast
Comparison: [DATE]

CLINICAL DATA: NG tube placement.

EXAM:
PORTABLE ABDOMEN - 1 VIEW

[abdomen supine]
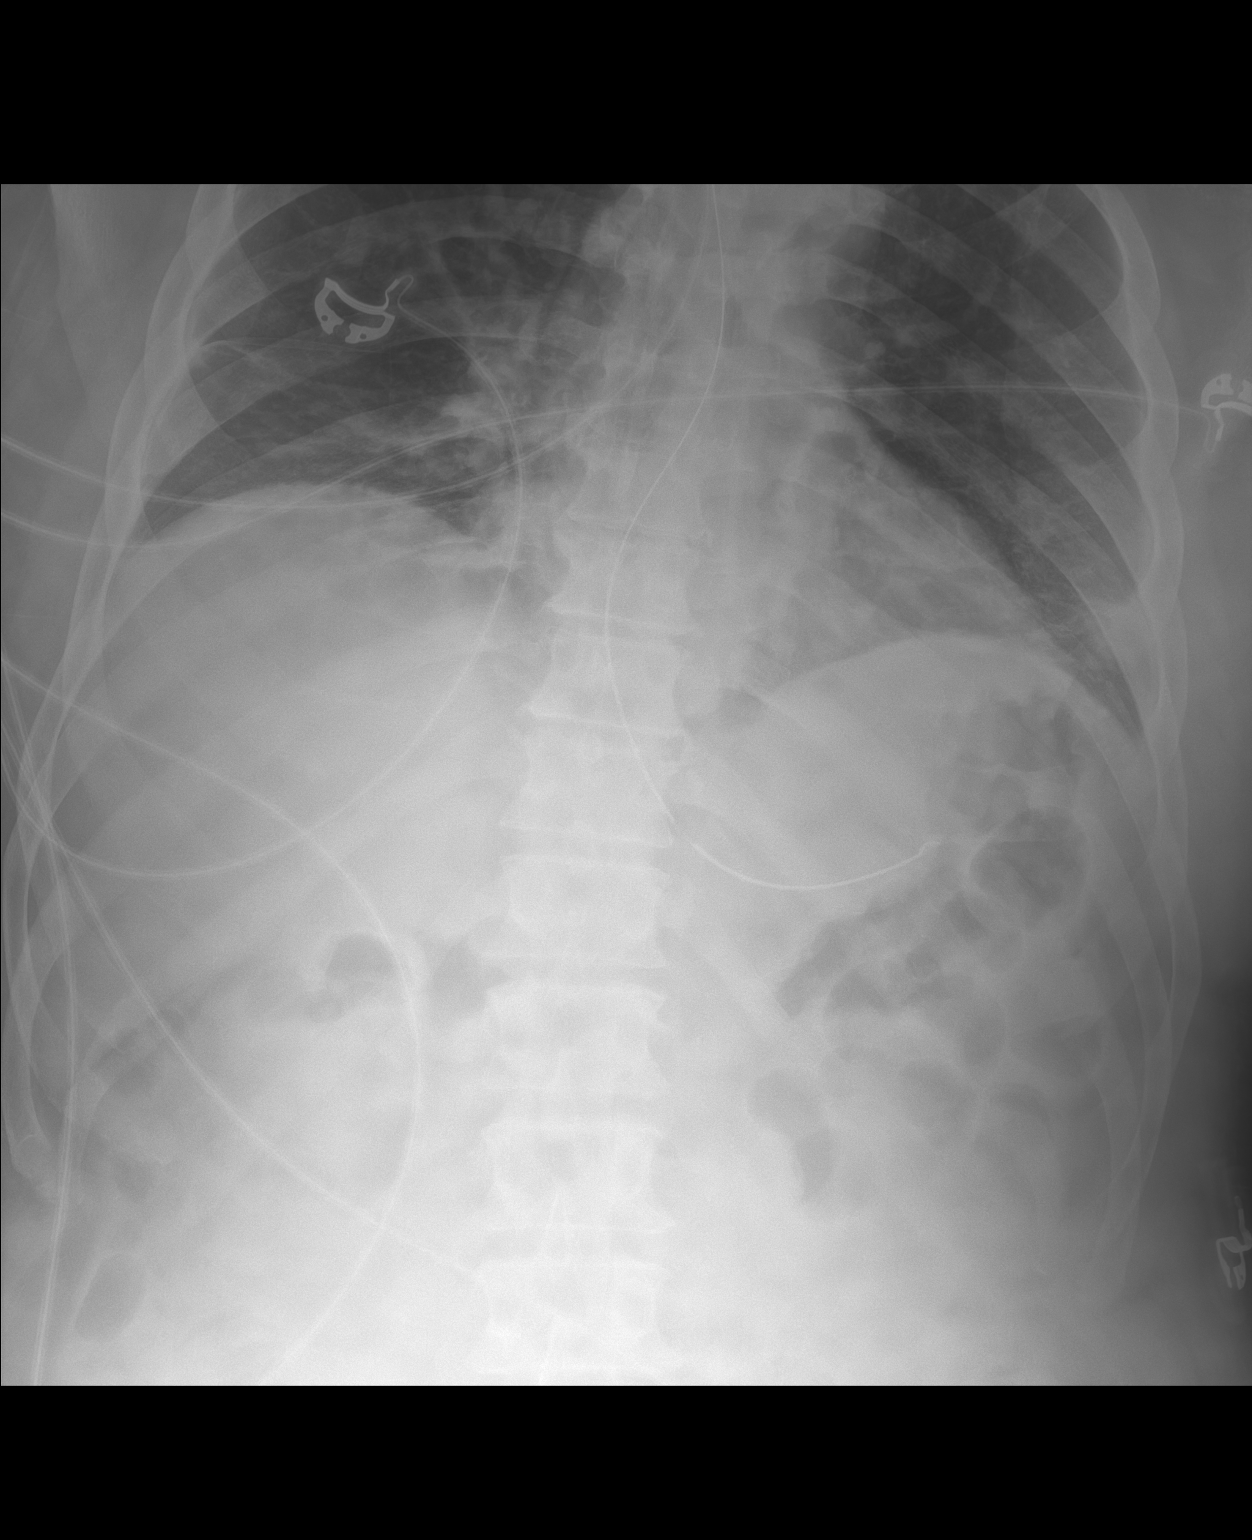

[1 of 1 positions shown; findings below may reference images not displayed]

FINDINGS: The enteric tube has been mildly retracted and is no longer
partially looped in the stomach. The tip of the tube projects in the
expected region of the gastric fundus with side hole near the GE
junction. No dilated loops of bowel are seen in the included portion
of the abdomen to suggest obstruction. Mild bibasilar lung opacities
have slightly increased.
IMPRESSION: 1. Mild interval retraction of the enteric tube with tip likely in
the gastric fundus and side hole near the GE junction.
2. Slightly increased bibasilar lung opacities which may reflect
atelectasis.

## 2020-08-01 MED ORDER — BETHANECHOL CHLORIDE 10 MG PO TABS
10.0000 mg | ORAL_TABLET | Freq: Three times a day (TID) | ORAL | Status: DC
  Administered 2020-08-01 – 2020-10-06 (×197): 10 mg
  Filled 2020-08-01 (×208): qty 1

## 2020-08-01 MED ORDER — ISOSORBIDE DINITRATE 10 MG PO TABS
20.0000 mg | ORAL_TABLET | Freq: Three times a day (TID) | ORAL | Status: DC
  Administered 2020-08-01 – 2020-08-03 (×7): 20 mg
  Filled 2020-08-01 (×7): qty 2

## 2020-08-01 MED ORDER — PROSOURCE TF PO LIQD
45.0000 mL | Freq: Two times a day (BID) | ORAL | Status: DC
  Administered 2020-08-02 – 2020-09-03 (×65): 45 mL
  Filled 2020-08-01 (×67): qty 45

## 2020-08-01 MED ORDER — HYDRALAZINE HCL 50 MG PO TABS
50.0000 mg | ORAL_TABLET | Freq: Three times a day (TID) | ORAL | Status: DC
  Administered 2020-08-01 – 2020-08-15 (×42): 50 mg
  Filled 2020-08-01 (×42): qty 1

## 2020-08-01 MED ORDER — AMIODARONE HCL 200 MG PO TABS
400.0000 mg | ORAL_TABLET | Freq: Every day | ORAL | Status: DC
  Administered 2020-08-01 – 2020-08-04 (×4): 400 mg
  Filled 2020-08-01 (×4): qty 2

## 2020-08-01 MED ORDER — LABETALOL HCL 5 MG/ML IV SOLN
10.0000 mg | Freq: Once | INTRAVENOUS | Status: AC
  Administered 2020-08-01: 01:00:00 10 mg via INTRAVENOUS

## 2020-08-01 MED ORDER — OSMOLITE 1.5 CAL PO LIQD
1000.0000 mL | ORAL | Status: AC
  Administered 2020-08-02 – 2020-08-07 (×4): 1000 mL
  Filled 2020-08-01 (×11): qty 1000

## 2020-08-01 MED ORDER — LABETALOL HCL 5 MG/ML IV SOLN
INTRAVENOUS | Status: AC
  Filled 2020-08-01: qty 4

## 2020-08-01 NOTE — Progress Notes (Signed)
Progress Note  Patient Name: Antonio Navarro Date of Encounter: 08/01/2020  [facility] Cardiologist: Fransico Him, MD   Subjective   Patient extubated yesterday. Awake. Follows some commands. Denies pain.  Inpatient Medications    Scheduled Meds: . amiodarone  400 mg Per Tube Daily  . aspirin  81 mg Per Tube Daily  . atorvastatin  80 mg Per Tube Daily  . bethanechol  10 mg Per Tube TID  . carvedilol  25 mg Per Tube BID WC  . chlorhexidine gluconate (MEDLINE KIT)  15 mL Mouth Rinse BID  . Chlorhexidine Gluconate Cloth  6 each Topical Daily  . feeding supplement (PROSource TF)  45 mL Per Tube QID  . free water  200 mL Per Tube Q4H  . heparin  5,000 Units Subcutaneous Q8H  . hydrALAZINE  50 mg Per Tube Q8H  . insulin aspart  0-20 Units Subcutaneous Q4H  . isosorbide dinitrate  20 mg Per Tube TID  . mouth rinse  15 mL Mouth Rinse BID  . polyvinyl alcohol  1 drop Both Eyes QID  . sodium chloride flush  3 mL Intravenous Q12H  . ticagrelor  90 mg Per Tube BID   Continuous Infusions: . sodium chloride    . sodium chloride Stopped (07/23/20 0948)  . sodium chloride Stopped (07/22/20 0955)  . feeding supplement (VITAL 1.5 CAL) 60 mL/hr at 07/31/20 1100   PRN Meds: sodium chloride, acetaminophen, docusate, metoprolol tartrate, polyethylene glycol, sodium chloride flush   Vital Signs    Vitals:   08/01/20 0500 08/01/20 0523 08/01/20 0600 08/01/20 0752  BP:  (!) 201/112 (!) 183/106   Pulse: 98  95   Resp: (!) 22  (!) 25   Temp:    98.8 F (37.1 C)  TempSrc:    Oral  SpO2: 98%  100%   Weight:      Height:        Intake/Output Summary (Last 24 hours) at 08/01/2020 0815 Last data filed at 08/01/2020 0600 Gross per 24 hour  Intake 123.15 ml  Output 3560 ml  Net -3436.85 ml   Last 3 Weights 08/01/2020 07/31/2020 07/30/2020  Weight (lbs) 196 lb 13.9 oz 201 lb 11.5 oz 203 lb 0.7 oz  Weight (kg) 89.3 kg 91.5 kg 92.1 kg      Telemetry    NSR. 5 beat run NSVT  last night. 30 beat run NSVT this am 7:14. - Personally Reviewed  ECG    No AM EKG - Personally Reviewed  Physical Exam   General: awake. NAD HEENT: No JVD. Feeding tube in place Lungs:Clear bilaterally Cardiovascular: Regular rate and rhythm. No murmurs, gallops or rubs. Abdomen:Soft. Bowel sounds present.  Extremities: No lower extremity edema.  Neuro: awake. Follows commands not verbal.   Labs    High Sensitivity Troponin:   Recent Labs  Lab 07/22/20 0418 07/22/20 1220  TROPONINIHS 818* 24,533*      Chemistry Recent Labs  Lab 07/26/20 0526 07/27/20 0318 07/28/20 0009 07/30/20 0103 07/31/20 0111 08/01/20 0039  NA 140 144   < > 142 145 146*  K 3.5 3.3*   < > 4.4 4.4 4.7  CL 110 111   < > 112* 114* 114*  CO2 18* 21*   < > 19* 19* 22  GLUCOSE 171* 172*   < > 289* 212* 141*  BUN 30* 36*   < > 49* 65* 50*  CREATININE 1.51* 1.61*   < > 1.63* 2.00* 1.57*  CALCIUM  8.4* 8.8*   < > 9.1 9.0 9.5  PROT 5.9* 6.6  --   --   --   --   ALBUMIN 2.3* 2.6*  --   --   --   --   AST 66* 73*  --   --   --   --   ALT 43 52*  --   --   --   --   ALKPHOS 105 161*  --   --   --   --   BILITOT 1.4* 1.7*  --   --   --   --   GFRNONAA 53* 49*   < > 49* 38* 51*  ANIONGAP 12 12   < > '11 12 10   ' < > = values in this interval not displayed.     Hematology Recent Labs  Lab 07/31/20 0111 07/31/20 2221 08/01/20 0039  WBC 17.1* 24.4* 20.6*  RBC 2.64* 3.29* 3.20*  HGB 8.5* 10.7* 10.8*  HCT 27.6* 34.8* 33.1*  MCV 104.5* 105.8* 103.4*  MCH 32.2 32.5 33.8  MCHC 30.8 30.7 32.6  RDW 13.3 13.2 13.1  PLT 237 310 315    BNPNo results for input(s): BNP, PROBNP in the last 168 hours.   DDimer No results for input(s): DDIMER in the last 168 hours.   Radiology    DG Abd 1 View  Result Date: 07/31/2020 CLINICAL DATA:  NG tube placement EXAM: ABDOMEN - 1 VIEW COMPARISON:  None. FINDINGS: Esophageal tube is mildly looped within the region of gastric fundus. Upper gas pattern is  unremarkable IMPRESSION: Esophageal tube tip overlies the gastric fundus. Electronically Signed   By: Donavan Foil M.D.   On: 07/31/2020 17:34   DG Abd Portable 1V  Result Date: 08/01/2020 CLINICAL DATA:  NG tube placement. EXAM: PORTABLE ABDOMEN - 1 VIEW COMPARISON:  07/31/2020 FINDINGS: The enteric tube has been mildly retracted and is no longer partially looped in the stomach. The tip of the tube projects in the expected region of the gastric fundus with side hole near the GE junction. No dilated loops of bowel are seen in the included portion of the abdomen to suggest obstruction. Mild bibasilar lung opacities have slightly increased. IMPRESSION: 1. Mild interval retraction of the enteric tube with tip likely in the gastric fundus and side hole near the GE junction. 2. Slightly increased bibasilar lung opacities which may reflect atelectasis. Electronically Signed   By: Logan Bores M.D.   On: 08/01/2020 08:02    Cardiac Studies   Echo 07/23/20: 1. Left ventricular ejection fraction, by estimation, is 40 to 45%. The  left ventricle has mildly decreased function. Left ventricular endocardial  border not optimally defined to evaluate regional wall motion, but  regional wall motion abnormalities are  present. There is mild left ventricular hypertrophy. Left ventricular  diastolic parameters are consistent with Grade I diastolic dysfunction  (impaired relaxation).  2. Right ventricular systolic function is mildly reduced. The right  ventricular size is normal. Tricuspid regurgitation signal is inadequate  for assessing PA pressure.  3. The aortic valve is tricuspid. Aortic valve regurgitation is not  visualized. No aortic stenosis is present.  4. The mitral valve is grossly normal. No evidence of mitral valve  regurgitation. No evidence of mitral stenosis.  5. The inferior vena cava is normal in size with <50% respiratory  variability, suggesting right atrial pressure of 8 mmHg.     Patient Profile     59 y.o. male with history  of tobacco abuse, obesity, HTN who was admitted 07/22/20 post cardiac arrest with inferolateral STEMI. He was resuscitated in the field by EMS after being found to be in V fib. Approximately 30 minutes of CPR. He was intubated on arrival. 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 No other obstructive disease.  LvEF=40-45% by echo 07/23/20.    Assessment & Plan    1. Cardiac arrest secondary to acute inferolateral STEMI: s/p stenting of the mid Circumflex. Completed the cooling protocol. Hemodynamically stable. Still with some hypoxic encephalopathy but improved and now extubated. Continue ASA, Brilinta, beta blocker and statin.   2. Ischemic cardiomyopathy with acute combined systolic/diastolic CHF: LVEF= 71-06%. Continue beta blocker. Will not start an Ace-inh or ARB given his renal insufficiency. BP is high. Hydralazine increased to 50 mg tid and will add isordil today. On Coreg at 25 mg bid. Appears euvolemic.   3. Hypoxic respiratory failure/Anoxic encephalopathy: PCCM and Neurology following. Improving. Swallowing evaluation pending.   4. Acute on chronic kidney disease. Creatinine improved 2.0>> 1.57.   5. NSVT. Electrolytes are normal. On Coreg 25 mg bid. Not really a candidate for ICD at this point. Will initiate oral amiodarone and observe.      For questions or updates, please contact [place] Please consult [email or web address] for contact info under    Signed, Loie Jahr [place], MD

## 2020-08-01 NOTE — Evaluation (Signed)
Clinical/Bedside Swallow Evaluation Patient Details  Name: Antonio Navarro MRN: [id number] Date of Birth: [birth year]  Today's Date: 08/01/2020 Time: SLP Start Time (ACUTE ONLY): 0844 SLP Stop Time (ACUTE ONLY): 8546 SLP Time Calculation (min) (ACUTE ONLY): 10 min  Past Medical History:  Past Medical History:  Diagnosis Date  . Hypertension   . Kidney stones    Past Surgical History:  Past Surgical History:  Procedure Laterality Date  . CORONARY/GRAFT ACUTE MI REVASCULARIZATION N/A 07/22/2020   Procedure: Coronary/Graft Acute MI Revascularization;  Surgeon: Lorretta Harp, MD;  Location: [facility];  Service: Cardiovascular;  Laterality: N/A;  . LEFT HEART CATH AND CORONARY ANGIOGRAPHY N/A 07/22/2020   Procedure: LEFT HEART CATH AND CORONARY ANGIOGRAPHY;  Surgeon: Lorretta Harp, MD;  Location: [facility];  Service: Cardiovascular;  Laterality: N/A;   HPI:  Pt is a 59 yo male s/p arrest with downtime 9 minutes. Initial rhythm was V. fib, shocked and received 6 rounds of epi before ROSC achieved.  Total CPR time was 30 minutes. ETT 12/4-12/13. MRI 12/7 suggestive of a widespread anoxic injury. PMH includes kidney stones and HTN.   Assessment / Plan / Recommendation Clinical Impression  Pt presents with a multifactorial dysphagia resulting from cognitive changes, deconditioning, and prolonged intubation. He does not vocalize or attempt to verbalize throughout session, nor does he follow commands consistently. His cognition plays a large role at the moment in that he does not have the awareness to accept boluses via spoon or straw. What very little gets into his mouth with assistance from SLP, spills out entirely from his lips. No swallowing is elicited. Recommend that he remains NPO for now with ongoing SLP f/u. Would also consider ordering SLP cognitive evaluation in light of current presentation and MRI suggestive of anoxic injury. SLP Visit Diagnosis: Dysphagia,  unspecified (R13.10)    Aspiration Risk  Severe aspiration risk;Risk for inadequate nutrition/hydration    Diet Recommendation NPO;Alternative means - temporary   Medication Administration: Via alternative means    Other  Recommendations Oral Care Recommendations: Oral care QID Other Recommendations: Have oral suction available   Follow up Recommendations  (tba)      Frequency and Duration min 2x/week  2 weeks       Prognosis Prognosis for Safe Diet Advancement: Fair Barriers to Reach Goals: Cognitive deficits;Severity of deficits      Swallow Study   General HPI: Pt is a 59 yo male s/p arrest with downtime 9 minutes. Initial rhythm was V. fib, shocked and received 6 rounds of epi before ROSC achieved.  Total CPR time was 30 minutes. ETT 12/4-12/13. MRI 12/7 suggestive of a widespread anoxic injury. PMH includes kidney stones and HTN. Type of Study: Bedside Swallow Evaluation Previous Swallow Assessment: none in chart Diet Prior to this Study: NPO;NG Tube Temperature Spikes Noted: No Respiratory Status: Nasal cannula History of Recent Intubation: Yes Length of Intubations (days): 9 days Date extubated: 07/31/20 Behavior/Cognition: Alert;Requires cueing Oral Cavity Assessment: Within Functional Limits Oral Care Completed by SLP: Yes Oral Cavity - Dentition: Adequate natural dentition;Missing dentition Self-Feeding Abilities: Total assist Patient Positioning: Upright in bed;Postural control interferes with function Baseline Vocal Quality: Not observed Volitional Cough: Cognitively unable to elicit Volitional Swallow: Unable to elicit    Oral/Motor/Sensory Function Overall Oral Motor/Sensory Function:  (appears symmetrical but not following commands)   Ice Chips Ice chips: Impaired Presentation: Spoon Oral Phase Impairments: Poor awareness of bolus;Reduced labial seal Oral Phase Functional Implications: Right anterior  spillage Pharyngeal Phase Impairments: Unable to  trigger swallow   Thin Liquid Thin Liquid: Impaired Presentation: Straw Oral Phase Impairments: Poor awareness of bolus;Reduced labial seal Oral Phase Functional Implications: Right anterior spillage    Nectar Thick Nectar Thick Liquid: Not tested   Honey Thick Honey Thick Liquid: Not tested   Puree Puree: Not tested   Solid     Solid: Not tested      Osie Bond., M.A. [place] Pager [phone] Office [phone]  08/01/2020,9:01 AM

## 2020-08-01 NOTE — Progress Notes (Signed)
Nutrition Follow Up  DOCUMENTATION CODES:   Not applicable  INTERVENTION:   Once Cortrak placed tomorrow:  -Osmolite 1.5 @ 65 ml/hr (1560 ml) -ProSource TF 45 ml BID -Free water flushes 200 ml Q4 hours   Provides: 2420 kcals, 120 grams protein, 1189 ml free water (2389 ml with flushes).   NUTRITION DIAGNOSIS:   Inadequate oral intake related to inability to eat as evidenced by NPO status.  Ongoing  GOAL:   Patient will meet greater than or equal to 90% of their needs   Not meeting   MONITOR:   Vent status,Labs,Weight trends,TF tolerance,Skin,I & O's  REASON FOR ASSESSMENT:   Consult,Ventilator Enteral/tube feeding initiation and management  ASSESSMENT:   Patient with PMH significant for HTN and kidney stones. Presents this admission s/p cardiac arrest.  12/04- s/p L heart cath and coronary angiography 12/13- extubated   Pt discussed during ICU rounds and with RN.   MRI brain confirmed hypoxic/anoxic cortical injury. Had SLP evaluation, mental status playing large role in inability to take PO. Had NG placed yesterday but has pulled the tube out some. Side port noted to be at the GE junction, needs advancement. Hold TF for now. Plan to replace NG with Cortrak tomorrow and start TF then. If diet unable to progress, consider long term feeding tube if within [place].   Admission weight: 97.7 kg  Current weight: 89.3 kg   UOP: 3560 ml x 24 hrs   Medications: SS novolog Labs: Na 146 (H) CBG 116-145  Diet Order:   Diet Order            Diet NPO time specified  Diet effective now                 EDUCATION NEEDS:   Not appropriate for education at this time  Skin:  Skin Assessment: Skin Integrity Issues: Skin Integrity Issues:: Other (Comment) Other: Skin tear- R back  Last BM:  12/14  Height:   Ht Readings from Last 1 Encounters:  07/22/20 6' (1.829 m)    Weight:   Wt Readings from Last 1 Encounters:  08/01/20 89.3 kg    Ideal Body Weight:   80.9 kg  BMI:  Body mass index is 26.7 kg/m.  Estimated Nutritional Needs:   Kcal:  2300-2500 kcal  Protein:  115-130 grams  Fluid:  >/= 2 L/day   Mariana Single RD, LDN Clinical Nutrition Pager listed in [place]

## 2020-08-01 NOTE — Progress Notes (Addendum)
eLink Physician-Brief Progress Note Patient Name: Antonio Navarro DOB: 1961-01-28 MRN: [id number]   Date of Service  08/01/2020  HPI/Events of Note  Hypertension - BP = 217/95. No improvement with scheduled hydralazine and Metoprolol PRN. HR = 96.  eICU Interventions  Plan: 1. Labetalol 10 mg IV X 1 now.      Intervention Category Major Interventions: Hypertension - evaluation and management  Demoni Parmar [place] 08/01/2020, 1:06 AM

## 2020-08-01 NOTE — Progress Notes (Signed)
     NAME:  Antonio Navarro, MRN:  [id number], DOB:  [birth year], LOS: 68 ADMISSION DATE:  07/22/2020, CONSULTATION DATE: 07/22/2020 REFERRING MD: Dr. Roxanne Mins, CHIEF COMPLAINT: Status post arrest  Brief History   Patient is 59 year old male status post arrest.  Downtime 9 minutes.  Initial rhythm was V. fib, shocked and received 6 rounds of epi before ROSC achieved.  Total CPR time was 30 minutes.   Past Medical History   . Hypertension   . Kidney stones      Significant Hospital Events   12/4-DES to 95% circumflex stenosis  Consults:  Cardiology, PCCM  Procedures:  As above  Significant Diagnostic Tests:  12/4 CT head>>no acute findings 12/5 Echo>>EF 39-76%, grade 1 diastolic dysfunction 73/4 EEG>> diffuse encephalopathy but no seizure activity. Micro Data:  12/4 Covid and flu>>negative 12/4 MRSA>>neg 12/4 Resp culture>>  Antimicrobials:  Ceftriaxone 12/4-12/8  Interim history/subjective:  Patient blood pressure was elevated to systolic 193X, awaiting speech and swallow evaluation.  Had runs of nonsustained V. tach  Objective   Blood pressure 110/87, pulse 97, temperature 98.8 F (37.1 C), temperature source Oral, resp. rate (!) 22, height 6' (1.829 m), weight 89.3 kg, SpO2 95 %.        Intake/Output Summary (Last 24 hours) at 08/01/2020 1007 Last data filed at 08/01/2020 0800 Gross per 24 hour  Intake 263.15 ml  Output 3910 ml  Net -3646.85 ml   Filed Weights   07/30/20 0414 07/31/20 0500 08/01/20 0459  Weight: 92.1 kg 91.5 kg 89.3 kg   Examination: Constitutional: Middle-age African-American male, lying on the bed  Eyes: Eyes open, pupils equal, reactive to light Ears, nose, mouth, and throat Cardiovascular: RRR, ext warm Respiratory: Clear to auscultation bilaterally, no wheezes or rhonchi Gastrointestinal: Nontender, nondistended, bowel sounds present Skin: No rashes, normal turgor Neurologic: Eyes open, tracks examiner, intermittently following simple  commands.  Localizes painful stimuli.  Resolved Hospital Problem list   Constipation Acute hypoxemic respiratory failure, successfully extubated  Assessment & Plan:  Out of hospital Vfib arrest due to STEMI Acute inferior rater ST elevation MI- s/p PCI w/ DES placement in circumflex New diagnosis of acute systolic/diastolic congestive heart failure due to ischemic cardiomyopathy with reduced LV systolic function EF 40 to 50% Nonsustained V. 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 AKI on CKD3a Uncontrolled hypertension  MRI brain confirmed hypoxic/anoxic cortical injury Continue aspirin, Brilinta and atorvastatin Cardiology input is appreciated Continue Coreg, can't to start him on ACE inhibitor due to  AKI Continue hydralazine, Isordil added this morning because of uncontrolled blood pressure, can convert to Imdur if patient passed swallow evaluation which is scheduled for today Patient serum creatinine is better today down from 2 to1.5, closely monitor Blood pressure is not well controlled, now he is on Coreg, hydralazine and Isordil Bedside swallow evaluation Per cardiology patient is not candidate for ICD, started on oral amiodarone  Best practice (evaluated daily)   Diet: continue TF at bedside swallow evaluation Pain/Anxiety/Delirium protocol (if indicated): N/A VAP protocol (if indicated): N/A DVT prophylaxis: heparin GI prophylaxis: Yes Glucose control: Basal and bolus insulin Mobility: Bedrest Family Communication: Patient sister was updated over the phone Code Status: Full Disposition: Cardiac telemetry    Jacky Kindle MD [facility] Pager: [phone] Mobile: [phone]

## 2020-08-01 NOTE — Plan of Care (Signed)
  Problem: Clinical Measurements: Goal: Ability to maintain clinical measurements within normal limits will improve Outcome: Progressing Goal: Diagnostic test results will improve Outcome: Progressing Goal: Respiratory complications will improve Outcome: Progressing Goal: Cardiovascular complication will be avoided Outcome: Progressing   Problem: Elimination: Goal: Will not experience complications related to bowel motility Outcome: Progressing

## 2020-08-01 NOTE — Progress Notes (Signed)
Patient ID: SIRE POET, male   DOB: 1961/07/17, 59 y.o.   MRN: [id number]  This NP visited patient at the bedside as a follow up for palliative medicine needs and emotional support.  Patient is now extubated however he remains with significant residual effects of hypoxic event.  He is unable to safely tolerate orals, unable to follow commands.  Spoke to [employer] by telephone.    Continued conversation regarding current medical situation; diagnosis, prognosis, treatment option decisions, advanced directive decisions and anticipatory care needs.  Patient is unable to make decisions for himself at this time.  We spoke to the importance/difficult decisions facing the family.  Legally the patient's's adult son is Marine scientist.   A  discussion was had today regarding advanced directives.  Concepts specific to code status, artifical feeding and hydration, continued IV antibiotics and rehospitalization was had.   Values and goals of care important to patient and family were attempted to be elicited.  Education offered regarding  the importance of continued conversation with family and the medical providers regarding overall plan of care and treatment options,  ensuring decisions are within the context of the patients values and GOCs.  Questions and concerns addressed     Total time spent on the unit was 25 minutes  Greater than 50% of the time was spent in counseling and coordination of care  Wadie Lessen NP  Palliative Medicine Team Team Phone # [phone] Pager [phone]

## 2020-08-02 LAB — GLUCOSE, CAPILLARY
Glucose-Capillary: 130 mg/dL — ABNORMAL HIGH (ref 70–99)
Glucose-Capillary: 135 mg/dL — ABNORMAL HIGH (ref 70–99)
Glucose-Capillary: 150 mg/dL — ABNORMAL HIGH (ref 70–99)
Glucose-Capillary: 152 mg/dL — ABNORMAL HIGH (ref 70–99)
Glucose-Capillary: 188 mg/dL — ABNORMAL HIGH (ref 70–99)
Glucose-Capillary: 218 mg/dL — ABNORMAL HIGH (ref 70–99)
Glucose-Capillary: 241 mg/dL — ABNORMAL HIGH (ref 70–99)

## 2020-08-02 LAB — BASIC METABOLIC PANEL
Anion gap: 10 (ref 5–15)
BUN: 58 mg/dL — ABNORMAL HIGH (ref 6–20)
CO2: 22 mmol/L (ref 22–32)
Calcium: 9.3 mg/dL (ref 8.9–10.3)
Chloride: 112 mmol/L — ABNORMAL HIGH (ref 98–111)
Creatinine, Ser: 1.73 mg/dL — ABNORMAL HIGH (ref 0.61–1.24)
GFR, Estimated: 45 mL/min — ABNORMAL LOW (ref >60)
Glucose, Bld: 155 mg/dL — ABNORMAL HIGH (ref 70–99)
Potassium: 4.7 mmol/L (ref 3.5–5.1)
Sodium: 144 mmol/L (ref 135–145)

## 2020-08-02 LAB — MAGNESIUM: Magnesium: 2.6 mg/dL — ABNORMAL HIGH (ref 1.7–2.4)

## 2020-08-02 MED ORDER — FREE WATER
200.0000 mL | Status: DC
  Administered 2020-08-02 – 2020-09-05 (×251): 200 mL

## 2020-08-02 NOTE — Progress Notes (Signed)
  Speech Language Pathology Treatment: Dysphagia  Patient Details Name: Antonio Navarro MRN: [id number] DOB: 1961-06-11 Today's Date: 08/02/2020 Time: 2637-8588 SLP Time Calculation (min) (ACUTE ONLY): 13 min  Assessment / Plan / Recommendation Clinical Impression  Pt shows minimal signs of improvement in regards to bolus awareness, parting his lips to receive boluses today but still accepting them passively. Ice chips and water either spill out of his mouth or are suctioned back out despite Max cues to swallow, seal his lips. Delayed coughing is noted and is concerning for possible posterior spillage toward the airway. His sister was present and updated on current status and recommendations. SLP will continue to follow - recommend ordering cognitive-linguistic evaluation.     HPI HPI: Pt is a 59 yo male s/p arrest with downtime 9 minutes. Initial rhythm was V. fib, shocked and received 6 rounds of epi before ROSC achieved.  Total CPR time was 30 minutes. ETT 12/4-12/13. MRI 12/7 suggestive of a widespread anoxic injury. PMH includes kidney stones and HTN.      SLP Plan  Continue with current plan of care       Recommendations  Diet recommendations: NPO Medication Administration: Via alternative means                Oral Care Recommendations: Oral care QID Follow up Recommendations:  (tba) SLP Visit Diagnosis: Dysphagia, unspecified (R13.10) Plan: Continue with current plan of care       GO                Osie Bond., M.A. [facility] Pager [phone] Office [phone]  08/02/2020, 12:05 PM

## 2020-08-02 NOTE — Progress Notes (Signed)
[facility] PROGRESS NOTE    Progress Note  Antonio Navarro  MCN:[id number] DOB: [birth year] DOA: 07/22/2020 PCP: Patient, No Pcp Per     Brief Narrative:   Antonio Navarro is an 59 y.o. male past medical history of essential hypertension chronic back pain who was admitted for cardiac arrest with downtime of 9 minutes out of hospital initial rhythm was V. fib receive shock and 6 rounds of epi before ROSC was achieved  Assessment/Plan:   Out of hospital V. fib due to acute ST elevation myocardial infarction (STEMI) ([place]) Post drug-eluting stent to the circumflex currently on aspirin and Brilinta.  Blockers and statins. Further management per cardiology.  Ischemic cardiomyopathy: With an EF of 40% by echo done on this admission. Continue beta-blocker no ACE inhibitor due to renal disease. Continue hydralazine along with Imdur.  Acute respiratory failure with hypoxia: Now extubated speech evaluated the patient he pulled NG tube out, core track was placed. Satting greater 96% on 2 L.  Acute encephalopathy: Likely due to hypoxia.  Minimally responsive, if he does not start to improve we will have to discuss moving towards comfort care.  Acute on chronic kidney disease stage IIIb: Likely prerenal in the setting of V. Fib. With a baseline like around 1.4.  Nonsustained SVT: Currently on amiodarone was added yesterday no episodes overnight continue Coreg, further management per cardiology.  DVT prophylaxis: lovenox Family Communication:Son Status is: Inpatient  Remains inpatient appropriate because:Hemodynamically unstable   Dispo: The patient is from: Home              Anticipated d/c is to: SNF              Anticipated d/c date is: > 3 days              Patient currently is not medically stable to d/c.        Code Status:     Code Status Orders  (From admission, onward)         Start     Ordered   07/22/20 0719  Full code  Continuous        07/22/20 0719         Code Status History    Date Active Date Inactive Code Status Order ID Comments User Context   07/22/2020 0559 07/22/2020 0719 Full Code [id number]  Shellia Cleverly, MD Inpatient   Advance Care Planning Activity        IV Access:    Peripheral IV   Procedures and diagnostic studies:   DG Abd 1 View  Result Date: 07/31/2020 CLINICAL DATA:  NG tube placement EXAM: ABDOMEN - 1 VIEW COMPARISON:  None. FINDINGS: Esophageal tube is mildly looped within the region of gastric fundus. Upper gas pattern is unremarkable IMPRESSION: Esophageal tube tip overlies the gastric fundus. Electronically Signed   By: Donavan Foil M.D.   On: 07/31/2020 17:34   DG Abd Portable 1V  Result Date: 08/01/2020 CLINICAL DATA:  NG tube placement. EXAM: PORTABLE ABDOMEN - 1 VIEW COMPARISON:  07/31/2020 FINDINGS: The enteric tube has been mildly retracted and is no longer partially looped in the stomach. The tip of the tube projects in the expected region of the gastric fundus with side hole near the GE junction. No dilated loops of bowel are seen in the included portion of the abdomen to suggest obstruction. Mild bibasilar lung opacities have slightly increased. IMPRESSION: 1. Mild interval retraction of the enteric tube with tip likely in  the gastric fundus and side hole near the GE junction. 2. Slightly increased bibasilar lung opacities which may reflect atelectasis. Electronically Signed   By: Logan Bores M.D.   On: 08/01/2020 08:02     Medical Consultants:    None.  Anti-Infectives:   none  Subjective:    Antonio Navarro nonverbal  Objective:    Vitals:   08/02/20 0336 08/02/20 0722 08/02/20 1047 08/02/20 1201  BP: (!) 129/99 (!) 161/107 (!) 149/111 107/77  Pulse: (!) 106 (!) 106 (!) 108 (!) 105  Resp: '19 19 18 18  ' Temp: 98.2 F (36.8 C) 98 F (36.7 C) 98.8 F (37.1 C) 98.3 F (36.8 C)  TempSrc:  Oral Oral   SpO2: 98% 99% 100% 97%  Weight: 90.2 kg     Height:       SpO2: 97  % O2 Flow Rate (L/min): 4 L/min FiO2 (%): 36 %   Intake/Output Summary (Last 24 hours) at 08/02/2020 1201 Last data filed at 08/02/2020 0905 Gross per 24 hour  Intake 600 ml  Output 1025 ml  Net -425 ml   Filed Weights   07/31/20 0500 08/01/20 0459 08/02/20 0336  Weight: 91.5 kg 89.3 kg 90.2 kg    Exam: General exam: In no acute distress. Respiratory system: Good air movement and clear to auscultation. Cardiovascular system: S1 & S2 heard, RRR. No JVD, murmurs, rubs, gallops or clicks.  Gastrointestinal system: Abdomen is nondistended, soft and nontender.  Central nervous system: Not alert or oriented to time person or place not able to follow commands Extremities: No pedal edema. Skin: No rashes, lesions or ulcers  Data Reviewed:    Labs: Basic Metabolic Panel: Recent Labs  Lab 07/28/20 0009 07/29/20 0051 07/30/20 0103 07/31/20 0111 08/01/20 0039 08/02/20 0340  NA 143 143 142 145 146* 144  K 3.8 4.1 4.4 4.4 4.7 4.7  CL 112* 112* 112* 114* 114* 112*  CO2 21* 19* 19* 19* 22 22  GLUCOSE 198* 308* 289* 212* 141* 155*  BUN 37* 45* 49* 65* 50* 58*  CREATININE 1.66* 1.62* 1.63* 2.00* 1.57* 1.73*  CALCIUM 8.2* 9.0 9.1 9.0 9.5 9.3  MG 2.0 2.1 2.3 2.3 2.3 2.6*  PHOS 3.2 3.3 3.7 3.9 4.6  --    GFR Estimated Creatinine Clearance: 51.1 mL/min (A) (by C-G formula based on SCr of 1.73 mg/dL (H)). Liver Function Tests: Recent Labs  Lab 07/27/20 0318  AST 73*  ALT 52*  ALKPHOS 161*  BILITOT 1.7*  PROT 6.6  ALBUMIN 2.6*   No results for input(s): LIPASE, AMYLASE in the last 168 hours. No results for input(s): AMMONIA in the last 168 hours. Coagulation profile No results for input(s): INR, PROTIME in the last 168 hours. COVID-19 Labs  No results for input(s): DDIMER, FERRITIN, LDH, CRP in the last 72 hours.  Lab Results  Component Value Date   [place] NEGATIVE 07/22/2020    CBC: Recent Labs  Lab 07/29/20 0051 07/30/20 0103 07/31/20 0111  07/31/20 2221 08/01/20 0039  WBC 13.5* 15.1* 17.1* 24.4* 20.6*  HGB 9.7* 9.7* 8.5* 10.7* 10.8*  HCT 29.9* 29.6* 27.6* 34.8* 33.1*  MCV 100.3* 102.1* 104.5* 105.8* 103.4*  PLT 236 250 237 310 315   Cardiac Enzymes: No results for input(s): CKTOTAL, CKMB, CKMBINDEX, TROPONINI in the last 168 hours. BNP (last 3 results) No results for input(s): PROBNP in the last 8760 hours. CBG: Recent Labs  Lab 08/01/20 1601 08/01/20 2014 08/02/20 0008 08/02/20 0418 08/02/20 0725  GLUCAP  129* 140* 135* 150* 130*   D-Dimer: No results for input(s): DDIMER in the last 72 hours. Hgb A1c: No results for input(s): HGBA1C in the last 72 hours. Lipid Profile: No results for input(s): CHOL, HDL, LDLCALC, TRIG, CHOLHDL, LDLDIRECT in the last 72 hours. Thyroid function studies: No results for input(s): TSH, T4TOTAL, T3FREE, THYROIDAB in the last 72 hours.  Invalid input(s): FREET3 Anemia work up: No results for input(s): VITAMINB12, FOLATE, FERRITIN, TIBC, IRON, RETICCTPCT in the last 72 hours. Sepsis Labs: Recent Labs  Lab 07/30/20 0103 07/31/20 0111 07/31/20 2221 08/01/20 0039  WBC 15.1* 17.1* 24.4* 20.6*   Microbiology Recent Results (from the past 240 hour(s))  Culture, respiratory (non-expectorated)     Status: None   Collection Time: 07/24/20 10:58 AM   Specimen: Tracheal Aspirate; Respiratory  Result Value Ref Range Status   Specimen Description TRACHEAL ASPIRATE  Final   Special Requests NONE  Final   Gram Stain   Final    RARE WBC PRESENT, PREDOMINANTLY MONONUCLEAR RARE GRAM POSITIVE COCCI RARE YEAST Performed at [facility], [place] [address], [place], [place] [address]    Culture FEW CANDIDA ALBICANS  Final   Report Status 07/27/2020 FINAL  Final     Medications:   . amiodarone  400 mg Per Tube Daily  . aspirin  81 mg Per Tube Daily  . atorvastatin  80 mg Per Tube Daily  . bethanechol  10 mg Per Tube TID  . carvedilol  25 mg Per Tube BID WC  . chlorhexidine  gluconate (MEDLINE KIT)  15 mL Mouth Rinse BID  . Chlorhexidine Gluconate Cloth  6 each Topical Daily  . feeding supplement (PROSource TF)  45 mL Per Tube BID  . free water  200 mL Per Tube Q4H  . heparin  5,000 Units Subcutaneous Q8H  . hydrALAZINE  50 mg Per Tube Q8H  . insulin aspart  0-20 Units Subcutaneous Q4H  . isosorbide dinitrate  20 mg Per Tube TID  . mouth rinse  15 mL Mouth Rinse BID  . polyvinyl alcohol  1 drop Both Eyes QID  . sodium chloride flush  3 mL Intravenous Q12H  . ticagrelor  90 mg Per Tube BID   Continuous Infusions: . sodium chloride Stopped (07/22/20 0955)  . feeding supplement (OSMOLITE 1.5 CAL) 1,000 mL (08/02/20 1052)      LOS: 11 days   Charlynne Cousins  [facility]  08/02/2020, 12:01 PM

## 2020-08-02 NOTE — Progress Notes (Signed)
Progress Note  Patient Name: Antonio Navarro Date of Encounter: 08/02/2020  [facility] Cardiologist: Fransico Him, MD   Subjective   Awake, unable to follow many commands this morning. Pulled out NG tube overnight.    Inpatient Medications    Scheduled Meds: . amiodarone  400 mg Per Tube Daily  . aspirin  81 mg Per Tube Daily  . atorvastatin  80 mg Per Tube Daily  . bethanechol  10 mg Per Tube TID  . carvedilol  25 mg Per Tube BID WC  . chlorhexidine gluconate (MEDLINE KIT)  15 mL Mouth Rinse BID  . Chlorhexidine Gluconate Cloth  6 each Topical Daily  . feeding supplement (PROSource TF)  45 mL Per Tube BID  . free water  200 mL Per Tube Q4H  . heparin  5,000 Units Subcutaneous Q8H  . hydrALAZINE  50 mg Per Tube Q8H  . insulin aspart  0-20 Units Subcutaneous Q4H  . isosorbide dinitrate  20 mg Per Tube TID  . mouth rinse  15 mL Mouth Rinse BID  . polyvinyl alcohol  1 drop Both Eyes QID  . sodium chloride flush  3 mL Intravenous Q12H  . ticagrelor  90 mg Per Tube BID   Continuous Infusions: . sodium chloride Stopped (07/22/20 0955)  . feeding supplement (OSMOLITE 1.5 CAL) Stopped (08/02/20 0830)   PRN Meds: acetaminophen, docusate, metoprolol tartrate, polyethylene glycol, sodium chloride flush   Vital Signs    Vitals:   08/01/20 2000 08/02/20 0005 08/02/20 0336 08/02/20 0722  BP: (!) 176/106 (!) 148/109 (!) 129/99 (!) 161/107  Pulse: 79 (!) 106 (!) 106 (!) 106  Resp: '18 17 19 19  ' Temp: 98.5 F (36.9 C) 97.6 F (36.4 C) 98.2 F (36.8 C) 98 F (36.7 C)  TempSrc: Oral Oral  Oral  SpO2: 99% 99% 98% 99%  Weight:   90.2 kg   Height:        Intake/Output Summary (Last 24 hours) at 08/02/2020 0907 Last data filed at 08/02/2020 0100 Gross per 24 hour  Intake 800 ml  Output 1225 ml  Net -425 ml   Last 3 Weights 08/02/2020 08/01/2020 07/31/2020  Weight (lbs) 198 lb 13.7 oz 196 lb 13.9 oz 201 lb 11.5 oz  Weight (kg) 90.2 kg 89.3 kg 91.5 kg      Telemetry     SR-->ST rates 100s - Personally Reviewed  ECG    No new tracing  Physical Exam  Frail young male.  GEN: No acute distress.  Neck: No JVD Cardiac: RRR, no murmurs, rubs, or gallops.  Respiratory: Clear to auscultation bilaterally. GI: Soft, nontender, non-distended  MS: No edema; No deformity. Neuro:  Able to follow few commands  Labs    High Sensitivity Troponin:   Recent Labs  Lab 07/22/20 0418 07/22/20 1220  TROPONINIHS 818* 24,533*      Chemistry Recent Labs  Lab 07/27/20 0318 07/28/20 0009 07/31/20 0111 08/01/20 0039 08/02/20 0340  NA 144   < > 145 146* 144  K 3.3*   < > 4.4 4.7 4.7  CL 111   < > 114* 114* 112*  CO2 21*   < > 19* 22 22  GLUCOSE 172*   < > 212* 141* 155*  BUN 36*   < > 65* 50* 58*  CREATININE 1.61*   < > 2.00* 1.57* 1.73*  CALCIUM 8.8*   < > 9.0 9.5 9.3  PROT 6.6  --   --   --   --  ALBUMIN 2.6*  --   --   --   --   AST 73*  --   --   --   --   ALT 52*  --   --   --   --   ALKPHOS 161*  --   --   --   --   BILITOT 1.7*  --   --   --   --   GFRNONAA 49*   < > 38* 51* 45*  ANIONGAP 12   < > '12 10 10   ' < > = values in this interval not displayed.     Hematology Recent Labs  Lab 07/31/20 0111 07/31/20 2221 08/01/20 0039  WBC 17.1* 24.4* 20.6*  RBC 2.64* 3.29* 3.20*  HGB 8.5* 10.7* 10.8*  HCT 27.6* 34.8* 33.1*  MCV 104.5* 105.8* 103.4*  MCH 32.2 32.5 33.8  MCHC 30.8 30.7 32.6  RDW 13.3 13.2 13.1  PLT 237 310 315    BNPNo results for input(s): BNP, PROBNP in the last 168 hours.   DDimer No results for input(s): DDIMER in the last 168 hours.   Radiology    DG Abd 1 View  Result Date: 07/31/2020 CLINICAL DATA:  NG tube placement EXAM: ABDOMEN - 1 VIEW COMPARISON:  None. FINDINGS: Esophageal tube is mildly looped within the region of gastric fundus. Upper gas pattern is unremarkable IMPRESSION: Esophageal tube tip overlies the gastric fundus. Electronically Signed   By: Donavan Foil M.D.   On: 07/31/2020 17:34   DG Abd  Portable 1V  Result Date: 08/01/2020 CLINICAL DATA:  NG tube placement. EXAM: PORTABLE ABDOMEN - 1 VIEW COMPARISON:  07/31/2020 FINDINGS: The enteric tube has been mildly retracted and is no longer partially looped in the stomach. The tip of the tube projects in the expected region of the gastric fundus with side hole near the GE junction. No dilated loops of bowel are seen in the included portion of the abdomen to suggest obstruction. Mild bibasilar lung opacities have slightly increased. IMPRESSION: 1. Mild interval retraction of the enteric tube with tip likely in the gastric fundus and side hole near the GE junction. 2. Slightly increased bibasilar lung opacities which may reflect atelectasis. Electronically Signed   By: Logan Bores M.D.   On: 08/01/2020 08:02    Cardiac Studies   Cath: 07/22/20   IMPRESSION: Mr. Marchetta was witnessed cardiac arrest with defibrillation CPR. Initial rhythm was VF. The ROSC according to the ER doctor was approximate 30 minutes. The culprit lesion appeared to be an AV groove circumflex stenosis which was stented with a 2.25 x 12 mm long resolute Onyx drug-eluting stent. Patient remained hemodynamically stable throughout the procedure. He did receive 180 mg of crushed Brilinta down the OG tube but because of vomiting and suctioning I am not sure how much of that remained in his system and therefore I elected to bolus him with cangrelor and put him on an occasion. We will reattempt to give him crushed ticagrelor down his OG tube once he stops vomiting after which we can stop the Cangrelor drip. The sheath will be removed removed once ACT falls below 170 and pressure held. Heparin will be restarted 6 hours after sheath removal without a bolus. 2D echo will be obtained. PCCM will be admitting. It is unclear at this point whether the patient will recover neurologically.  Quay Burow. MD, [facility] 07/22/2020 Diagnostic Dominance: Right    Intervention    Echo:  07/23/20  IMPRESSIONS  1. Left ventricular ejection fraction, by estimation, is 40 to 45%. The  left ventricle has mildly decreased function. Left ventricular endocardial  border not optimally defined to evaluate regional wall motion, but  regional wall motion abnormalities are  present. There is mild left ventricular hypertrophy. Left ventricular  diastolic parameters are consistent with Grade I diastolic dysfunction  (impaired relaxation).  2. Right ventricular systolic function is mildly reduced. The right  ventricular size is normal. Tricuspid regurgitation signal is inadequate  for assessing PA pressure.  3. The aortic valve is tricuspid. Aortic valve regurgitation is not  visualized. No aortic stenosis is present.  4. The mitral valve is grossly normal. No evidence of mitral valve  regurgitation. No evidence of mitral stenosis.  5. The inferior vena cava is normal in size with <50% respiratory  variability, suggesting right atrial pressure of 8 mmHg.   Patient Profile     59 y.o. male with history of tobacco abuse, obesity, HTN who was admitted 07/22/20 post cardiac arrest with inferolateral STEMI. He was resuscitated in the field by EMS after being found to be in V fib. Approximately 30 minutes of CPR. He was intubated on arrival. 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 No other obstructive disease.  LvEF=40-45% by echo 07/23/20.    Assessment & Plan    1. Cardiac arrest 2/2 acute inferolateral STEMI: PCI/DES of the mLCx. Underwent cooling protocol now extubated. Still quite encephalopathic, follows few commands.  -- on DAPT with ASA/Brilinta, BB and statin. Of note he pulled out his NGT overnight. Planned for Cortrack today. Will review with MD regarding Brilinta dose as RN is unsure of when Cortrack to be placed.   2. ICM: EF noted at 40-45%. Currently on BB with no room for ACEi/ARB with renal dysfunction. Started on hydralazine  29m TID along with isordil yesterday.   3. Hypoxic respiratory failure with acute encephalopathy: now extubated. PCCM/neurology following. Speech on board as well. As above, pulled out NGT. If unable to place soon will need to convert medications to IV route in order to control BP  4. Acute on Chronic kidney disease: Cr 2.0>>1.57>>1.73 today  5. NSVT: amiodarone 4056mdaily added yesterday. No episodes noted overnight. Electrolytes stable.  -- on coreg 2538mID   For questions or updates, please contact [email or web address] consult [email or web address] for contact info under        Signed, LinReino BellisP  08/02/2020, 9:07 AM

## 2020-08-02 NOTE — Plan of Care (Signed)
  Problem: Activity: Goal: Risk for activity intolerance will decrease Outcome: Progressing   Problem: Elimination: Goal: Will not experience complications related to urinary retention Outcome: Progressing   

## 2020-08-02 NOTE — Progress Notes (Signed)
NGT found out of place this am. NGT removed by RN. Rounding MD on the unit, made aware.

## 2020-08-02 NOTE — Procedures (Signed)
Cortrak  Person Inserting Tube:  Maylon Peppers C, RD Tube Type:  Cortrak - 43 inches Tube Location:  Left nare Initial Placement:  Stomach Secured by: Bridle Technique Used to Measure Tube Placement:  Documented cm marking at nare/ corner of mouth Cortrak Secured At:  74 cm    Cortrak Tube Team Note:  Consult received to place a Cortrak feeding tube.   No x-ray is required. RN may begin using tube.   If the tube becomes dislodged please keep the tube and contact the Cortrak team at [email or web address] ([email or web address]) for replacement.  If after hours and replacement cannot be delayed, place a NG tube and confirm placement with an abdominal x-ray.   Lockie Pares., RD, LDN, CNSC See AMiON for contact information

## 2020-08-02 NOTE — Progress Notes (Signed)
Nutrition Follow-up  DOCUMENTATION CODES:   Not applicable  INTERVENTION:   Initiate Osmolite 1.5 @ 65 ml/hr via cortrak tube  45 ml Prosource TF BID    200 ml free water flush every 4 hours  Tube feeding regimen provides 2420 kcal (100% of needs), 120 grams of protein, and 1189 ml of H2O. Total free water: 2389 ml/ daily  NUTRITION DIAGNOSIS:   Inadequate oral intake related to inability to eat as evidenced by NPO status.  Ongoing  GOAL:   Patient will meet greater than or equal to 90% of their needs  Progressing   MONITOR:   Vent status,Labs,Weight trends,TF tolerance,Skin,I & O's  REASON FOR ASSESSMENT:   Consult,Ventilator Enteral/tube feeding initiation and management  ASSESSMENT:   Patient with PMH significant for HTN and kidney stones. Presents this admission s/p cardiac arrest.  12/13- NGT d/c 12/15- cortrak placed, tip of tube in the stomach  Reviewed I/O's: -225 ml x 24 hours and -3.6 L since admission  UOP: 1.2 L x 24 hours  Pt lying in bed at time of visit. He winked at this RD when greeted. He also nodded his head when asked if he was feeling better.   Cortrak tube just placed. RN plans to start TF today.   Labs reviewed: Mg: 2.6, CBGS: 130-150 (inpatient orders for glycemic control are 0-20 units insulin aspart every 4 hours).   Diet Order:   Diet Order            Diet NPO time specified  Diet effective now                 EDUCATION NEEDS:   Not appropriate for education at this time  Skin:  Skin Assessment: Skin Integrity Issues: Skin Integrity Issues:: Other (Comment) Other: Skin tear- R back  Last BM:  08/01/20  Height:   Ht Readings from Last 1 Encounters:  07/22/20 6' (1.829 m)    Weight:   Wt Readings from Last 1 Encounters:  08/02/20 90.2 kg    Ideal Body Weight:  80.9 kg  BMI:  Body mass index is 26.97 kg/m.  Estimated Nutritional Needs:   Kcal:  2300-2500 kcal  Protein:  115-130 grams  Fluid:  >/=  2 L/day    Loistine Chance, RD, LDN, [place] Registered Dietitian II Certified Diabetes Care and Education Specialist Please refer to [facility] for RD and/or RD on-call/weekend/after hours pager

## 2020-08-03 DIAGNOSIS — R1319 Other dysphagia: Secondary | ICD-10-CM

## 2020-08-03 LAB — CBC
HCT: 32.2 % — ABNORMAL LOW (ref 39.0–52.0)
Hemoglobin: 10.4 g/dL — ABNORMAL LOW (ref 13.0–17.0)
MCH: 33.7 pg (ref 26.0–34.0)
MCHC: 32.3 g/dL (ref 30.0–36.0)
MCV: 104.2 fL — ABNORMAL HIGH (ref 80.0–100.0)
Platelets: 366 10*3/uL (ref 150–400)
RBC: 3.09 MIL/uL — ABNORMAL LOW (ref 4.22–5.81)
RDW: 13.2 % (ref 11.5–15.5)
WBC: 22.9 10*3/uL — ABNORMAL HIGH (ref 4.0–10.5)
nRBC: 0 % (ref 0.0–0.2)

## 2020-08-03 LAB — GLUCOSE, CAPILLARY
Glucose-Capillary: 109 mg/dL — ABNORMAL HIGH (ref 70–99)
Glucose-Capillary: 251 mg/dL — ABNORMAL HIGH (ref 70–99)
Glucose-Capillary: 232 mg/dL — ABNORMAL HIGH (ref 70–99)
Glucose-Capillary: 215 mg/dL — ABNORMAL HIGH (ref 70–99)
Glucose-Capillary: 207 mg/dL — ABNORMAL HIGH (ref 70–99)
Glucose-Capillary: 234 mg/dL — ABNORMAL HIGH (ref 70–99)

## 2020-08-03 LAB — BASIC METABOLIC PANEL
Anion gap: 11 (ref 5–15)
BUN: 63 mg/dL — ABNORMAL HIGH (ref 6–20)
CO2: 23 mmol/L (ref 22–32)
Calcium: 9 mg/dL (ref 8.9–10.3)
Chloride: 112 mmol/L — ABNORMAL HIGH (ref 98–111)
Creatinine, Ser: 1.89 mg/dL — ABNORMAL HIGH (ref 0.61–1.24)
GFR, Estimated: 41 mL/min — ABNORMAL LOW (ref >60)
Glucose, Bld: 127 mg/dL — ABNORMAL HIGH (ref 70–99)
Potassium: 4.6 mmol/L (ref 3.5–5.1)
Sodium: 146 mmol/L — ABNORMAL HIGH (ref 135–145)

## 2020-08-03 MED ORDER — ISOSORBIDE DINITRATE 10 MG PO TABS
30.0000 mg | ORAL_TABLET | Freq: Three times a day (TID) | ORAL | Status: DC
  Administered 2020-08-03 – 2020-08-14 (×34): 30 mg
  Filled 2020-08-03 (×34): qty 3

## 2020-08-03 NOTE — Progress Notes (Signed)
[facility] PROGRESS NOTE    Progress Note  [name]  OFB:[id number] DOB: 1961-04-09 DOA: 07/22/2020 PCP: Patient, No Pcp Per     Brief Narrative:   Antonio Navarro is an 59 y.o. male past medical history of essential hypertension chronic back pain who was admitted for cardiac arrest with downtime of 9 minutes out of hospital initial rhythm was V. fib receive shock and 6 rounds of epi before ROSC was achieved  Assessment/Plan:   Out of hospital V. fib due to acute ST elevation myocardial infarction (STEMI) ([place]) Post drug-eluting stent to the circumflex currently on aspirin and Brilinta.  Beta-Blockers and statins. Further management per cardiology.  Ischemic cardiomyopathy: With an EF of 40% by echo done on this admission. Continue beta-blocker no ACE inhibitor due to renal disease. Continue hydralazine, increase Isordil.  Acute respiratory failure with hypoxia: Now extubated speech evaluated the patient and deemed him very high risk for aspiration and they recommended n.p.o. Core track was placed. Satting greater 96% on 2 L.  Acute encephalopathy due to anoxic brain injury: Likely due to hypoxia in the setting of out of hospital V. fib arrest due to STEMI Core track in place continue tube feedings. Palliative care has been consulted and is working with family  Acute on chronic kidney disease stage IIIb: Likely prerenal in the setting of V. Fib. With a baseline like around 1.6-2, continue free water per tube. Continue to follow basic metabolic panel intermittently.  Nonsustained SVT: Currently on amiodarone was added yesterday no episodes overnight continue Coreg, further management per cardiology.  DVT prophylaxis: lovenox Family Communication:Son Status is: Inpatient  Remains inpatient appropriate because:Hemodynamically unstable   Dispo: The patient is from: Home              Anticipated d/c is to: SNF              Anticipated d/c date is: > 3 days               Patient currently is not medically stable to d/c. Code Status:     Code Status Orders  (From admission, onward)         Start     Ordered   07/22/20 0719  Full code  Continuous        07/22/20 0719        Code Status History    Date Active Date Inactive Code Status Order ID Comments User Context   07/22/2020 0559 07/22/2020 0719 Full Code [id number]  Shellia Cleverly, MD Inpatient   Advance Care Planning Activity        IV Access:    Peripheral IV   Procedures and diagnostic studies:   No results found.   Medical Consultants:    None.  Anti-Infectives:   none  Subjective:    Antonio Navarro nonverbal  Objective:    Vitals:   08/02/20 2346 08/03/20 0326 08/03/20 0510 08/03/20 0917  BP: 130/82 (!) 148/90 (!) 151/103 (!) 145/88  Pulse: 99 79 (!) 103 99  Resp: '20 16  17  ' Temp: 98.1 F (36.7 C) 98 F (36.7 C)  98.2 F (36.8 C)  TempSrc: Oral Oral  Axillary  SpO2: 100% 100%  100%  Weight:   90.5 kg   Height:       SpO2: 100 % O2 Flow Rate (L/min): 6 L/min FiO2 (%): 36 %   Intake/Output Summary (Last 24 hours) at 08/03/2020 0948 Last data filed at 08/03/2020 [phone]  Gross per 24 hour  Intake 2054.67 ml  Output 1225 ml  Net 829.67 ml   Filed Weights   08/01/20 0459 08/02/20 0336 08/03/20 0510  Weight: 89.3 kg 90.2 kg 90.5 kg    Exam: General exam: In no acute distress. Respiratory system: Good air movement and clear to auscultation. Cardiovascular system: S1 & S2 heard, RRR. No JVD. Gastrointestinal system: Abdomen is nondistended, soft and nontender.  Central nervous system: Not able to follow commands withdraws to pain. Extremities: No pedal edema. Skin: No rashes, lesions or ulcers  Data Reviewed:    Labs: Basic Metabolic Panel: Recent Labs  Lab 07/28/20 0009 07/29/20 0051 07/30/20 0103 07/31/20 0111 08/01/20 0039 08/02/20 0340 08/03/20 0232  NA 143 143 142 145 146* 144 146*  K 3.8 4.1 4.4 4.4 4.7 4.7 4.6  CL  112* 112* 112* 114* 114* 112* 112*  CO2 21* 19* 19* 19* '22 22 23  ' GLUCOSE 198* 308* 289* 212* 141* 155* 127*  BUN 37* 45* 49* 65* 50* 58* 63*  CREATININE 1.66* 1.62* 1.63* 2.00* 1.57* 1.73* 1.89*  CALCIUM 8.2* 9.0 9.1 9.0 9.5 9.3 9.0  MG 2.0 2.1 2.3 2.3 2.3 2.6*  --   PHOS 3.2 3.3 3.7 3.9 4.6  --   --    GFR Estimated Creatinine Clearance: 46.8 mL/min (A) (by C-G formula based on SCr of 1.89 mg/dL (H)). Liver Function Tests: No results for input(s): AST, ALT, ALKPHOS, BILITOT, PROT, ALBUMIN in the last 168 hours. No results for input(s): LIPASE, AMYLASE in the last 168 hours. No results for input(s): AMMONIA in the last 168 hours. Coagulation profile No results for input(s): INR, PROTIME in the last 168 hours. COVID-19 Labs  No results for input(s): DDIMER, FERRITIN, LDH, CRP in the last 72 hours.  Lab Results  Component Value Date   Solen NEGATIVE 07/22/2020    CBC: Recent Labs  Lab 07/30/20 0103 07/31/20 0111 07/31/20 2221 08/01/20 0039 08/03/20 0232  WBC 15.1* 17.1* 24.4* 20.6* 22.9*  HGB 9.7* 8.5* 10.7* 10.8* 10.4*  HCT 29.6* 27.6* 34.8* 33.1* 32.2*  MCV 102.1* 104.5* 105.8* 103.4* 104.2*  PLT 250 237 310 315 366   Cardiac Enzymes: No results for input(s): CKTOTAL, CKMB, CKMBINDEX, TROPONINI in the last 168 hours. BNP (last 3 results) No results for input(s): PROBNP in the last 8760 hours. CBG: Recent Labs  Lab 08/02/20 1624 08/02/20 2009 08/02/20 2349 08/03/20 0338 08/03/20 0757  GLUCAP 241* 218* 188* 109* 251*   D-Dimer: No results for input(s): DDIMER in the last 72 hours. Hgb A1c: No results for input(s): HGBA1C in the last 72 hours. Lipid Profile: No results for input(s): CHOL, HDL, LDLCALC, TRIG, CHOLHDL, LDLDIRECT in the last 72 hours. Thyroid function studies: No results for input(s): TSH, T4TOTAL, T3FREE, THYROIDAB in the last 72 hours.  Invalid input(s): FREET3 Anemia work up: No results for input(s): VITAMINB12, FOLATE, FERRITIN,  TIBC, IRON, RETICCTPCT in the last 72 hours. Sepsis Labs: Recent Labs  Lab 07/31/20 0111 07/31/20 2221 08/01/20 0039 08/03/20 0232  WBC 17.1* 24.4* 20.6* 22.9*   Microbiology Recent Results (from the past 240 hour(s))  Culture, respiratory (non-expectorated)     Status: None   Collection Time: 07/24/20 10:58 AM   Specimen: Tracheal Aspirate; Respiratory  Result Value Ref Range Status   Specimen Description TRACHEAL ASPIRATE  Final   Special Requests NONE  Final   Gram Stain   Final    RARE WBC PRESENT, PREDOMINANTLY MONONUCLEAR RARE GRAM POSITIVE COCCI RARE YEAST  Performed at [facility], [place] [address], [place], [place] [address]    Culture FEW CANDIDA ALBICANS  Final   Report Status 07/27/2020 FINAL  Final     Medications:   . amiodarone  400 mg Per Tube Daily  . aspirin  81 mg Per Tube Daily  . atorvastatin  80 mg Per Tube Daily  . bethanechol  10 mg Per Tube TID  . carvedilol  25 mg Per Tube BID WC  . chlorhexidine gluconate (MEDLINE KIT)  15 mL Mouth Rinse BID  . Chlorhexidine Gluconate Cloth  6 each Topical Daily  . feeding supplement (PROSource TF)  45 mL Per Tube BID  . free water  200 mL Per Tube Q3H  . heparin  5,000 Units Subcutaneous Q8H  . hydrALAZINE  50 mg Per Tube Q8H  . insulin aspart  0-20 Units Subcutaneous Q4H  . isosorbide dinitrate  20 mg Per Tube TID  . mouth rinse  15 mL Mouth Rinse BID  . polyvinyl alcohol  1 drop Both Eyes QID  . sodium chloride flush  3 mL Intravenous Q12H  . ticagrelor  90 mg Per Tube BID   Continuous Infusions: . sodium chloride Stopped (07/22/20 0955)  . feeding supplement (OSMOLITE 1.5 CAL) 1,000 mL (08/02/20 1052)      LOS: 12 days   Charlynne Cousins  [facility]  08/03/2020, 9:48 AM

## 2020-08-03 NOTE — Progress Notes (Signed)
Patient ID: Antonio Navarro, male   DOB: 08-Feb-1961, 59 y.o.   MRN: [id number]  This NP visited patient at the bedside as a follow up to last week' s GOCs meeting, and for palliative medicine needs and emotional support.  Patient is now extubated however he remains with significant residual effects of hypoxic event.  He is unable to safely tolerate orals/dysphagia, and unable to follow commands.  Legally the patient's son Antonio Navarro is his main medical decision maker, however patient's fiancee/Antonio Navarro and patient's 2 sisters along with his son are working together for "whatever is best" foe the patient.  I spoke to [place] by telephone and then spoke to both sisters Antonio Navarro by telephone for updates  Education offered regarding the current medical situation and the significance of hypoxic brain injury and its best case versus worst-case trajectory. Discussion had regarding decision regarding artificial feeding. Risks and benefits of PEG tube.  Education offered regarding likely next steps in transitions of care.  Family express interest in "doing whatever is necessary" to give the patient"more time" to declare himself.    Discussed with family the importance of continued conversation with each other and the medical providers regarding overall plan of care and treatment options,  ensuring decisions are within the context of the patients values and GOCs.  Family will need ongoing conversation/support as they navigatet difficult decisions  Questions and concerns addressed   Discussed with Dr Venetia Constable  Total time spent on the unit was 35 minutes  This nurse practitioner informed  the family and the attending that I will be out of the hospital until Monday morning.  If the patient is still hospitalized I will follow-up at that time.  Call palliative medicine team phone # [phone] with questions or concerns in the interim.  Greater than 50% of the time was spent in counseling and coordination of  care  Wadie Lessen NP  Palliative Medicine Team Team Phone # [phone] Pager [phone]

## 2020-08-03 NOTE — Progress Notes (Signed)
Progress Note  Patient Name: Antonio Navarro Date of Encounter: 08/03/2020  [facility] Cardiologist: Fransico Him, MD   Subjective   Awake, unable to follow many commands this morning. Moves right leg spontaneously  Inpatient Medications    Scheduled Meds:  amiodarone  400 mg Per Tube Daily   aspirin  81 mg Per Tube Daily   atorvastatin  80 mg Per Tube Daily   bethanechol  10 mg Per Tube TID   carvedilol  25 mg Per Tube BID WC   chlorhexidine gluconate (MEDLINE KIT)  15 mL Mouth Rinse BID   Chlorhexidine Gluconate Cloth  6 each Topical Daily   feeding supplement (PROSource TF)  45 mL Per Tube BID   free water  200 mL Per Tube Q3H   heparin  5,000 Units Subcutaneous Q8H   hydrALAZINE  50 mg Per Tube Q8H   insulin aspart  0-20 Units Subcutaneous Q4H   isosorbide dinitrate  20 mg Per Tube TID   mouth rinse  15 mL Mouth Rinse BID   polyvinyl alcohol  1 drop Both Eyes QID   sodium chloride flush  3 mL Intravenous Q12H   ticagrelor  90 mg Per Tube BID   Continuous Infusions:  sodium chloride Stopped (07/22/20 0955)   feeding supplement (OSMOLITE 1.5 CAL) 1,000 mL (08/02/20 1052)   PRN Meds: acetaminophen, docusate, metoprolol tartrate, polyethylene glycol, sodium chloride flush   Vital Signs    Vitals:   08/02/20 2346 08/03/20 0326 08/03/20 0510 08/03/20 0917  BP: 130/82 (!) 148/90 (!) 151/103 (!) 145/88  Pulse: 99 79 (!) 103 99  Resp: _0 Temp: 98.1 F (36.7 C) 98 F (36.7 C)  98.2 F (36.8 C)  TempSrc: Oral Oral  Axillary  SpO2: 100% 100%  100%  Weight:   90.5 kg   Height:        Intake/Output Summary (Last 24 hours) at 08/03/2020 0947 Last data filed at 08/03/2020 4196 Gross per 24 hour  Intake 2054.67 ml  Output 1225 ml  Net 829.67 ml   Last 3 Weights 08/03/2020 08/02/2020 08/01/2020  Weight (lbs) 199 lb 8.3 oz 198 lb 13.7 oz 196 lb 13.9 oz  Weight (kg) 90.5 kg 90.2 kg 89.3 kg      Telemetry    SR-->ST rates 100s -  Personally Reviewed  ECG    No new tracing  Physical Exam   Frail young male.  GEN: No acute distress.  Neck: No JVD Cardiac: RRR, no murmurs, rubs, or gallops.  Respiratory: Clear to auscultation bilaterally. GI: Soft, nontender, non-distended  MS: No edema; No deformity. Neuro:  awake. Unable to follow commands. Arms flaccid  Labs    High Sensitivity Troponin:   Recent Labs  Lab 07/22/20 0418 07/22/20 1220  TROPONINIHS 818* 24,533*      Chemistry Recent Labs  Lab 08/01/20 0039 08/02/20 0340 08/03/20 0232  NA 146* 144 146*  K 4.7 4.7 4.6  CL 114* 112* 112*  CO2 _1 GLUCOSE 141* 155* 127*  BUN 50* 58* 63*  CREATININE 1.57* 1.73* 1.89*  CALCIUM 9.5 9.3 9.0  GFRNONAA 51* 45* 41*  ANIONGAP _2 Hematology Recent Labs  Lab 07/31/20 2221 08/01/20 0039 08/03/20 0232  WBC 24.4* 20.6* 22.9*  RBC 3.29* 3.20* 3.09*  HGB 10.7* 10.8* 10.4*  HCT 34.8* 33.1* 32.2*  MCV 105.8* 103.4* 104.2*  MCH 32.5 33.8 33.7  MCHC 30.7 32.6 32.3  RDW 13.2 13.1 13.2  PLT 310 315 366    BNPNo results for input(s): BNP, PROBNP in the last 168 hours.   DDimer No results for input(s): DDIMER in the last 168 hours.   Radiology    No results found.  Cardiac Studies   Cath: 07/22/20   IMPRESSION: Mr. Eidem was witnessed cardiac arrest with defibrillation CPR. Initial rhythm was VF. The ROSC according to the ER doctor was approximate 30 minutes. The culprit lesion appeared to be an AV groove circumflex stenosis which was stented with a 2.25 x 12 mm long resolute Onyx drug-eluting stent. Patient remained hemodynamically stable throughout the procedure. He did receive 180 mg of crushed Brilinta down the OG tube but because of vomiting and suctioning I am not sure how much of that remained in his system and therefore I elected to bolus him with cangrelor and put him on an occasion. We will reattempt to give him crushed ticagrelor down his OG tube once he stops vomiting  after which we can stop the Cangrelor drip. The sheath will be removed removed once ACT falls below 170 and pressure held. Heparin will be restarted 6 hours after sheath removal without a bolus. 2D echo will be obtained. PCCM will be admitting. It is unclear at this point whether the patient will recover neurologically.  Quay Burow. MD, [facility] 07/22/2020 Diagnostic Dominance: Right    Intervention    Echo: 07/23/20  IMPRESSIONS    1. Left ventricular ejection fraction, by estimation, is 40 to 45%. The  left ventricle has mildly decreased function. Left ventricular endocardial  border not optimally defined to evaluate regional wall motion, but  regional wall motion abnormalities are  present. There is mild left ventricular hypertrophy. Left ventricular  diastolic parameters are consistent with Grade I diastolic dysfunction  (impaired relaxation).  2. Right ventricular systolic function is mildly reduced. The right  ventricular size is normal. Tricuspid regurgitation signal is inadequate  for assessing PA pressure.  3. The aortic valve is tricuspid. Aortic valve regurgitation is not  visualized. No aortic stenosis is present.  4. The mitral valve is grossly normal. No evidence of mitral valve  regurgitation. No evidence of mitral stenosis.  5. The inferior vena cava is normal in size with <50% respiratory  variability, suggesting right atrial pressure of 8 mmHg.   Patient Profile     59 y.o. male with history of tobacco abuse, obesity, HTN who was admitted 07/22/20 post cardiac arrest with inferolateral STEMI. He was resuscitated in the field by EMS after being found to be in V fib. Approximately 30 minutes of CPR. He was intubated on arrival. 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 No other obstructive disease.  LvEF=40-45% by echo 07/23/20.    Assessment & Plan    1. Cardiac arrest 2/2 acute inferolateral STEMI: PCI/DES of the  mLCx. Underwent cooling protocol now extubated. Still quite encephalopathic, follows few commands.  -- on DAPT with ASA/Brilinta, BB and statin.   2. ICM: EF noted at 40-45%. Currently on BB with no room for ACEi/ARB with renal dysfunction. Started on hydralazine 61m TID along with isordil yesterday. Can titrate hydralazine as needed for HTN.  3. Hypoxic respiratory failure with acute encephalopathy: now extubated. PCCM/neurology following. Speech on board as well. Still significant anoxic encephalopathy. Likely will need PEG tube and long term SNF. Overall outlook is poor. Palliative care on board. Son has legal responsibility.   4. Acute on Chronic  kidney disease: Cr 2.0>>1.57>>1.73 >>1.89 today  5. NSVT: amiodarone 486m daily added. No recurrent NSVT overnight. Electrolytes stable.  -- on coreg 255mBID  - on amiodarone. Not sure I would continue amiodarone long term but would like to clarify Code status first.   For questions or updates, please contact [facility] consult [email or web address] for contact info under        Signed, Ethlyn Alto JoMartiniqueMD  08/03/2020, 9:47 AM

## 2020-08-04 LAB — GLUCOSE, CAPILLARY
Glucose-Capillary: 236 mg/dL — ABNORMAL HIGH (ref 70–99)
Glucose-Capillary: 233 mg/dL — ABNORMAL HIGH (ref 70–99)
Glucose-Capillary: 205 mg/dL — ABNORMAL HIGH (ref 70–99)
Glucose-Capillary: 240 mg/dL — ABNORMAL HIGH (ref 70–99)
Glucose-Capillary: 246 mg/dL — ABNORMAL HIGH (ref 70–99)
Glucose-Capillary: 255 mg/dL — ABNORMAL HIGH (ref 70–99)
Glucose-Capillary: 269 mg/dL — ABNORMAL HIGH (ref 70–99)

## 2020-08-04 LAB — BASIC METABOLIC PANEL
Anion gap: 11 (ref 5–15)
BUN: 53 mg/dL — ABNORMAL HIGH (ref 6–20)
CO2: 23 mmol/L (ref 22–32)
Calcium: 9.1 mg/dL (ref 8.9–10.3)
Chloride: 112 mmol/L — ABNORMAL HIGH (ref 98–111)
Creatinine, Ser: 1.74 mg/dL — ABNORMAL HIGH (ref 0.61–1.24)
GFR, Estimated: 45 mL/min — ABNORMAL LOW (ref >60)
Glucose, Bld: 246 mg/dL — ABNORMAL HIGH (ref 70–99)
Potassium: 4.9 mmol/L (ref 3.5–5.1)
Sodium: 146 mmol/L — ABNORMAL HIGH (ref 135–145)

## 2020-08-04 LAB — CBC
HCT: 30.3 % — ABNORMAL LOW (ref 39.0–52.0)
Hemoglobin: 9.8 g/dL — ABNORMAL LOW (ref 13.0–17.0)
MCH: 33.8 pg (ref 26.0–34.0)
MCHC: 32.3 g/dL (ref 30.0–36.0)
MCV: 104.5 fL — ABNORMAL HIGH (ref 80.0–100.0)
Platelets: 355 10*3/uL (ref 150–400)
RBC: 2.9 MIL/uL — ABNORMAL LOW (ref 4.22–5.81)
RDW: 13.1 % (ref 11.5–15.5)
WBC: 18.5 10*3/uL — ABNORMAL HIGH (ref 4.0–10.5)
nRBC: 0.1 % (ref 0.0–0.2)

## 2020-08-04 MED ORDER — GERHARDT'S BUTT CREAM
TOPICAL_CREAM | CUTANEOUS | Status: DC | PRN
  Administered 2020-08-16: 1 via TOPICAL
  Filled 2020-08-04 (×3): qty 1

## 2020-08-04 NOTE — Progress Notes (Signed)
Inpatient Diabetes Program Recommendations  AACE/ADA: New Consensus Statement on Inpatient Glycemic Control (2015)  Target Ranges:  Prepandial:   less than 140 mg/dL      Peak postprandial:   less than 180 mg/dL (1-2 hours)      Critically ill patients:  140 - 180 mg/dL   Results for [name], [name] (MRN [id number]) as of 08/04/2020 14:28  Ref. Range 08/02/2020 23:49 08/03/2020 03:38 08/03/2020 07:57 08/03/2020 12:05 08/03/2020 16:42 08/03/2020 20:06  Glucose-Capillary Latest Ref Range: 70 - 99 mg/dL 188 (H) 109 (H) 251 (H) 232 (H) 215 (H) 207 (H)   Results for [name], [name] (MRN [id number]) as of 08/04/2020 14:28  Ref. Range 08/03/2020 23:28 08/04/2020 03:41 08/04/2020 08:00 08/04/2020 11:35  Glucose-Capillary Latest Ref Range: 70 - 99 mg/dL 234 (H)  7 units NOVOLOG  236 (H)  7 units NOVOLOG  233 (H)  7 units NOVOLOG  205 (H)  7 units NOVOLOG     Admit with: Out of hospital V. fib due to acute ST elevation myocardial infarction   No History of Diabetes noted  Current Orders: Novolog Resistant Correction Scale/ SSI (0-20 units) Q4 hours     MD- Note patient getting Osmolite tube feeds at 65cc/hr  CBGs have been >200 since yesterday AM  Please consider adding Novolog tube feed coverage:  Novolog 4 units Q4 hours  HOLD if tube feeds HELD for any reason    --Will follow patient during hospitalization--  Wyn Quaker RN, MSN, CDE Diabetes Coordinator Inpatient Glycemic Control Team Team Pager: [phone] (8a-5p)

## 2020-08-04 NOTE — Progress Notes (Signed)
  Speech Language Pathology Treatment: Dysphagia  Patient Details Name: Antonio Navarro MRN: [id number] DOB: February 05, 1961 Today's Date: 08/04/2020 Time: 5183-3582 SLP Time Calculation (min) (ACUTE ONLY): 10 min  Assessment / Plan / Recommendation Clinical Impression  Pt was seen for f/u dysphagia tx with more resistance to oral stimulation today, including during oral care as well as PO trials. Pt would shake his head "no" or turn his head away from most POs offered. When he got a small amount of ice or water in his mouth, it would promptly spill out of his mouth. One time, this appeared to be somewhat volitional even though the motion itself was passive, as pt tipped his head down to let the water escape his mouth. Oral suction was used throughout the session and even though pt was not observed to swallow any boluses despite Max cues, intermittent coughing was noted. Continue to recommend NPO.    HPI HPI: Pt is a 59 yo male s/p arrest with downtime 9 minutes. Initial rhythm was V. fib, shocked and received 6 rounds of epi before ROSC achieved.  Total CPR time was 30 minutes. ETT 12/4-12/13. MRI 12/7 suggestive of a widespread anoxic injury. PMH includes kidney stones and HTN.      SLP Plan  Continue with current plan of care       Recommendations  Diet recommendations: NPO Medication Administration: Via alternative means                Oral Care Recommendations: Oral care QID Follow up Recommendations:  (tba) SLP Visit Diagnosis: Dysphagia, unspecified (R13.10) Plan: Continue with current plan of care       GO                Osie Bond., M.A. [facility] Pager [phone] Office [phone]  08/04/2020, 9:37 AM

## 2020-08-04 NOTE — Progress Notes (Signed)
[facility] PROGRESS NOTE    Progress Note  Antonio Navarro  LSL:[id number] DOB: [birth year] DOA: 07/22/2020 PCP: Patient, No Pcp Per     Brief Narrative:   Antonio Navarro is an 59 y.o. male past medical history of essential hypertension chronic back pain who was admitted for cardiac arrest with downtime of 9 minutes out of hospital initial rhythm was V. fib receive shock and 6 rounds of epi before ROSC was achieved  Assessment/Plan:   Out of hospital V. fib due to acute ST elevation myocardial infarction (STEMI) ([place]) Post drug-eluting stent to the circumflex currently on aspirin and Brilinta.  Beta-Blockers and statins. Further management per cardiology.  Ischemic cardiomyopathy: With an EF of 40% by echo done on this admission. Continue beta-blocker no ACE inhibitor due to renal disease. Continue hydralazine and isordil.  Acute respiratory failure with hypoxia: Now extubated speech evaluated the patient and deemed him very high risk for aspiration and they recommended n.p.o. Core track was placed. Satting greater 96% on 2 L.  Acute encephalopathy due to anoxic brain injury: Likely due to hypoxia in the setting of out of hospital V. fib arrest due to STEMI Core track in place continue tube feedings. Palliative care has been consulted and is discussing with family.  Acute on chronic kidney disease stage IIIb: Likely prerenal in the setting of V. Fib. With a baseline like around 1.6-2, continue free water per tube. Continue to follow basic metabolic panel intermittently.  Nonsustained SVT: Currently on amiodarone was added yesterday no episodes overnight continue Coreg, further management per cardiology.  Hypernatremia: Continue free water per tube.  DVT prophylaxis: lovenox Family Communication:Son Status is: Inpatient  Remains inpatient appropriate because:Hemodynamically unstable   Dispo: The patient is from: Home              Anticipated d/c is to: SNF               Anticipated d/c date is: > 3 days              Patient currently is not medically stable to d/c. Code Status:     Code Status Orders  (From admission, onward)         Start     Ordered   07/22/20 0719  Full code  Continuous        07/22/20 0719        Code Status History    Date Active Date Inactive Code Status Order ID Comments User Context   07/22/2020 0559 07/22/2020 0719 Full Code [id number]  Shellia Cleverly, MD Inpatient   Advance Care Planning Activity        IV Access:    Peripheral IV   Procedures and diagnostic studies:   No results found.   Medical Consultants:    None.  Anti-Infectives:   none  Subjective:    Antonio Navarro noneverbal  Objective:    Vitals:   08/04/20 0338 08/04/20 0359 08/04/20 0459 08/04/20 0816  BP: (!) 140/93  (!) 144/88 (!) 153/99  Pulse: (!) 102   (!) 104  Resp: 18   18  Temp: 98.1 F (36.7 C)     TempSrc: Oral     SpO2: 100%   97%  Weight:  89.3 kg    Height:       SpO2: 97 % O2 Flow Rate (L/min): 5 L/min FiO2 (%): 36 %   Intake/Output Summary (Last 24 hours) at 08/04/2020 1013 Last data filed at  08/04/2020 0619 Gross per 24 hour  Intake 3071.33 ml  Output 1475 ml  Net 1596.33 ml   Filed Weights   08/02/20 0336 08/03/20 0510 08/04/20 0359  Weight: 90.2 kg 90.5 kg 89.3 kg    Exam: General exam: In no acute distress. Respiratory system: Good air movement and clear to auscultation. Cardiovascular system: S1 & S2 heard, RRR. No JVD. Gastrointestinal system: Abdomen is nondistended, soft and nontender.  Extremities: No pedal edema. Skin: No rashes, lesions or ulcers  Data Reviewed:    Labs: Basic Metabolic Panel: Recent Labs  Lab 07/29/20 0051 07/30/20 0103 07/31/20 0111 08/01/20 0039 08/02/20 0340 08/03/20 0232 08/04/20 0301  NA 143 142 145 146* 144 146* 146*  K 4.1 4.4 4.4 4.7 4.7 4.6 4.9  CL 112* 112* 114* 114* 112* 112* 112*  CO2 19* 19* 19* '22 22 23 23  ' GLUCOSE 308*  289* 212* 141* 155* 127* 246*  BUN 45* 49* 65* 50* 58* 63* 53*  CREATININE 1.62* 1.63* 2.00* 1.57* 1.73* 1.89* 1.74*  CALCIUM 9.0 9.1 9.0 9.5 9.3 9.0 9.1  MG 2.1 2.3 2.3 2.3 2.6*  --   --   PHOS 3.3 3.7 3.9 4.6  --   --   --    GFR Estimated Creatinine Clearance: 50.8 mL/min (A) (by C-G formula based on SCr of 1.74 mg/dL (H)). Liver Function Tests: No results for input(s): AST, ALT, ALKPHOS, BILITOT, PROT, ALBUMIN in the last 168 hours. No results for input(s): LIPASE, AMYLASE in the last 168 hours. No results for input(s): AMMONIA in the last 168 hours. Coagulation profile No results for input(s): INR, PROTIME in the last 168 hours. COVID-19 Labs  No results for input(s): DDIMER, FERRITIN, LDH, CRP in the last 72 hours.  Lab Results  Component Value Date   [place] NEGATIVE 07/22/2020    CBC: Recent Labs  Lab 07/31/20 0111 07/31/20 2221 08/01/20 0039 08/03/20 0232 08/04/20 0301  WBC 17.1* 24.4* 20.6* 22.9* 18.5*  HGB 8.5* 10.7* 10.8* 10.4* 9.8*  HCT 27.6* 34.8* 33.1* 32.2* 30.3*  MCV 104.5* 105.8* 103.4* 104.2* 104.5*  PLT 237 310 315 366 355   Cardiac Enzymes: No results for input(s): CKTOTAL, CKMB, CKMBINDEX, TROPONINI in the last 168 hours. BNP (last 3 results) No results for input(s): PROBNP in the last 8760 hours. CBG: Recent Labs  Lab 08/03/20 1205 08/03/20 1642 08/03/20 2006 08/03/20 2328 08/04/20 0341  GLUCAP 232* 215* 207* 234* 236*   D-Dimer: No results for input(s): DDIMER in the last 72 hours. Hgb A1c: No results for input(s): HGBA1C in the last 72 hours. Lipid Profile: No results for input(s): CHOL, HDL, LDLCALC, TRIG, CHOLHDL, LDLDIRECT in the last 72 hours. Thyroid function studies: No results for input(s): TSH, T4TOTAL, T3FREE, THYROIDAB in the last 72 hours.  Invalid input(s): FREET3 Anemia work up: No results for input(s): VITAMINB12, FOLATE, FERRITIN, TIBC, IRON, RETICCTPCT in the last 72 hours. Sepsis Labs: Recent Labs  Lab  07/31/20 2221 08/01/20 0039 08/03/20 0232 08/04/20 0301  WBC 24.4* 20.6* 22.9* 18.5*   Microbiology No results found for this or any previous visit (from the past 240 hour(s)).   Medications:   . aspirin  81 mg Per Tube Daily  . atorvastatin  80 mg Per Tube Daily  . bethanechol  10 mg Per Tube TID  . carvedilol  25 mg Per Tube BID WC  . chlorhexidine gluconate (MEDLINE KIT)  15 mL Mouth Rinse BID  . Chlorhexidine Gluconate Cloth  6 each Topical Daily  .  feeding supplement (PROSource TF)  45 mL Per Tube BID  . free water  200 mL Per Tube Q3H  . heparin  5,000 Units Subcutaneous Q8H  . hydrALAZINE  50 mg Per Tube Q8H  . insulin aspart  0-20 Units Subcutaneous Q4H  . isosorbide dinitrate  30 mg Per Tube TID  . mouth rinse  15 mL Mouth Rinse BID  . polyvinyl alcohol  1 drop Both Eyes QID  . sodium chloride flush  3 mL Intravenous Q12H  . ticagrelor  90 mg Per Tube BID   Continuous Infusions: . sodium chloride Stopped (07/22/20 0955)  . feeding supplement (OSMOLITE 1.5 CAL) 1,000 mL (08/03/20 1937)      LOS: 13 days   Charlynne Cousins  [facility]  08/04/2020, 10:13 AM

## 2020-08-04 NOTE — Progress Notes (Signed)
Progress Note  Patient Name: Antonio Navarro Date of Encounter: 08/04/2020  [facility] Cardiologist: Fransico Him, MD   Subjective   Awake, unable to follow many commands this morning. Moves right leg spontaneously. nonverbal  Inpatient Medications    Scheduled Meds: . aspirin  81 mg Per Tube Daily  . atorvastatin  80 mg Per Tube Daily  . bethanechol  10 mg Per Tube TID  . carvedilol  25 mg Per Tube BID WC  . chlorhexidine gluconate (MEDLINE KIT)  15 mL Mouth Rinse BID  . Chlorhexidine Gluconate Cloth  6 each Topical Daily  . feeding supplement (PROSource TF)  45 mL Per Tube BID  . free water  200 mL Per Tube Q3H  . heparin  5,000 Units Subcutaneous Q8H  . hydrALAZINE  50 mg Per Tube Q8H  . insulin aspart  0-20 Units Subcutaneous Q4H  . isosorbide dinitrate  30 mg Per Tube TID  . mouth rinse  15 mL Mouth Rinse BID  . polyvinyl alcohol  1 drop Both Eyes QID  . sodium chloride flush  3 mL Intravenous Q12H  . ticagrelor  90 mg Per Tube BID   Continuous Infusions: . sodium chloride Stopped (07/22/20 0955)  . feeding supplement (OSMOLITE 1.5 CAL) 1,000 mL (08/03/20 1937)   PRN Meds: acetaminophen, docusate, metoprolol tartrate, polyethylene glycol, sodium chloride flush   Vital Signs    Vitals:   08/04/20 0338 08/04/20 0359 08/04/20 0459 08/04/20 0816  BP: (!) 140/93  (!) 144/88 (!) 153/99  Pulse: (!) 102   (!) 104  Resp: 18   18  Temp: 98.1 F (36.7 C)     TempSrc: Oral     SpO2: 100%   97%  Weight:  89.3 kg    Height:        Intake/Output Summary (Last 24 hours) at 08/04/2020 1000 Last data filed at 08/04/2020 3500 Gross per 24 hour  Intake 3071.33 ml  Output 1475 ml  Net 1596.33 ml   Last 3 Weights 08/04/2020 08/03/2020 08/02/2020  Weight (lbs) 196 lb 13.9 oz 199 lb 8.3 oz 198 lb 13.7 oz  Weight (kg) 89.3 kg 90.5 kg 90.2 kg      Telemetry    SR--no NSVT - Personally Reviewed  ECG    No new tracing  Physical Exam   Frail young male.   GEN: No acute distress.  Neck: No JVD Cardiac: RRR, no murmurs, rubs, or gallops.  Respiratory: Clear to auscultation bilaterally. GI: Soft, nontender, non-distended  MS: No edema; No deformity. Neuro:  awake. Unable to follow commands. Arms flaccid  Labs    High Sensitivity Troponin:   Recent Labs  Lab 07/22/20 0418 07/22/20 1220  TROPONINIHS 818* 24,533*      Chemistry Recent Labs  Lab 08/02/20 0340 08/03/20 0232 08/04/20 0301  NA 144 146* 146*  K 4.7 4.6 4.9  CL 112* 112* 112*  CO2 '22 23 23  ' GLUCOSE 155* 127* 246*  BUN 58* 63* 53*  CREATININE 1.73* 1.89* 1.74*  CALCIUM 9.3 9.0 9.1  GFRNONAA 45* 41* 45*  ANIONGAP '10 11 11     ' Hematology Recent Labs  Lab 08/01/20 0039 08/03/20 0232 08/04/20 0301  WBC 20.6* 22.9* 18.5*  RBC 3.20* 3.09* 2.90*  HGB 10.8* 10.4* 9.8*  HCT 33.1* 32.2* 30.3*  MCV 103.4* 104.2* 104.5*  MCH 33.8 33.7 33.8  MCHC 32.6 32.3 32.3  RDW 13.1 13.2 13.1  PLT 315 366 355    BNPNo results  for input(s): BNP, PROBNP in the last 168 hours.   DDimer No results for input(s): DDIMER in the last 168 hours.   Radiology    No results found.  Cardiac Studies   Cath: 07/22/20   IMPRESSION: Mr. Broz was witnessed cardiac arrest with defibrillation CPR. Initial rhythm was VF. The ROSC according to the ER doctor was approximate 30 minutes. The culprit lesion appeared to be an AV groove circumflex stenosis which was stented with a 2.25 x 12 mm long resolute Onyx drug-eluting stent. Patient remained hemodynamically stable throughout the procedure. He did receive 180 mg of crushed Brilinta down the OG tube but because of vomiting and suctioning I am not sure how much of that remained in his system and therefore I elected to bolus him with cangrelor and put him on an occasion. We will reattempt to give him crushed ticagrelor down his OG tube once he stops vomiting after which we can stop the Cangrelor drip. The sheath will be removed removed once ACT  falls below 170 and pressure held. Heparin will be restarted 6 hours after sheath removal without a bolus. 2D echo will be obtained. PCCM will be admitting. It is unclear at this point whether the patient will recover neurologically.  Quay Burow. MD, [facility] 07/22/2020 Diagnostic Dominance: Right    Intervention    Echo: 07/23/20  IMPRESSIONS    1. Left ventricular ejection fraction, by estimation, is 40 to 45%. The  left ventricle has mildly decreased function. Left ventricular endocardial  border not optimally defined to evaluate regional wall motion, but  regional wall motion abnormalities are  present. There is mild left ventricular hypertrophy. Left ventricular  diastolic parameters are consistent with Grade I diastolic dysfunction  (impaired relaxation).  2. Right ventricular systolic function is mildly reduced. The right  ventricular size is normal. Tricuspid regurgitation signal is inadequate  for assessing PA pressure.  3. The aortic valve is tricuspid. Aortic valve regurgitation is not  visualized. No aortic stenosis is present.  4. The mitral valve is grossly normal. No evidence of mitral valve  regurgitation. No evidence of mitral stenosis.  5. The inferior vena cava is normal in size with <50% respiratory  variability, suggesting right atrial pressure of 8 mmHg.   Patient Profile     59 y.o. male with history of tobacco abuse, obesity, HTN who was admitted 07/22/20 post cardiac arrest with inferolateral STEMI. He was resuscitated in the field by EMS after being found to be in V fib. Approximately 30 minutes of CPR. He was intubated on arrival. 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 No other obstructive disease.  LvEF=40-45% by echo 07/23/20.    Assessment & Plan    1. Cardiac arrest 2/2 acute inferolateral STEMI: PCI/DES of the mLCx. Underwent cooling protocol now extubated. Still quite encephalopathic, follows few  commands.  -- on DAPT with ASA/Brilinta, BB and statin.   2. ICM: EF noted at 40-45%. Currently on BB with no room for ACEi/ARB with renal dysfunction. Started on hydralazine 70m TID along with isordil yesterday. Can titrate hydralazine as needed for HTN.  3. Hypoxic respiratory failure with acute encephalopathy: now extubated. PCCM/neurology following. Speech on board as well. Still significant anoxic encephalopathy. Likely will need PEG tube and long term SNF. Overall outlook is poor. Palliative care on board. Son has legal responsibility.   4. Acute on Chronic kidney disease: Cr 2.0>>1.57>>1.73 >>1.89>> 1.74 today  5. NSVT: no recurrence in over  48 hours.  -- on coreg 37m BID  - will DC amiodarone.   [facility] will sign off.   Medication Recommendations:  [facility] (labs, testing, etc):  none Follow up as an outpatient:  Will need follow up with cardiology post DC depending on meaningful functional recovery    For questions or updates, please contact CPark CityPlease consult [email or web address] for contact info under        Signed, Romero Letizia JMartinique MD  08/04/2020, 10:00 AM

## 2020-08-04 NOTE — Progress Notes (Signed)
Nutrition Follow-up  DOCUMENTATION CODES:   Not applicable  INTERVENTION:   -Continue Osmolite 1.5 @ 65 ml/hr via cortrak tube  45 ml Prosource TF BID    200 ml free water flush every 4 hours  Tube feeding regimen provides 2420 kcal (100% of needs), 120 grams of protein, and 1189 ml of H2O. Total free water: 2389 ml/ daily  NUTRITION DIAGNOSIS:   Inadequate oral intake related to inability to eat as evidenced by NPO status.  Ongoing  GOAL:   Patient will meet greater than or equal to 90% of their needs  Met with TF  MONITOR:   Diet advancement,Labs,Weight trends,TF tolerance,Skin,I & O's  REASON FOR ASSESSMENT:   Consult,Ventilator Enteral/tube feeding initiation and management  ASSESSMENT:   Patient with PMH significant for HTN and kidney stones. Presents this admission s/p cardiac arrest.  12/13- NGT d/c 12/15- cortrak placed, tip of tube in the stomach 12/17- s/p BSE- recommend continue NPO  Reviewed I/O's: +1.2 L x 24 hours and -2 L since admission  UOP: 1.9 L x 24 hours  Pt less interactive with this RD in comparison to previous visit. He did not respond to name being called or questions.   Pt remains NPO and dependent on TF via cortrak tube. Osmolite 1.5 infusing via cortrak tube at 65 ml/hr. Pt also receiving 45 ml Prosource TF BID snd  200 ml free water flush every 4 hours. Tube feeding regimen provides 2420 kcal (100% of needs), 120 grams of protein, and 1189 ml of H2O. Total free water: 2389 ml/ daily.   Palliative care following and having ongoing goals of care discussions with family. Pt may require PEG if pt and family continue to desire aggressive care.   Labs reviewed: Na: 146, CBGS: 205-236.   Diet Order:   Diet Order            Diet NPO time specified  Diet effective now                 EDUCATION NEEDS:   Not appropriate for education at this time  Skin:  Skin Assessment: Skin Integrity Issues: Skin Integrity Issues:: Other  (Comment) Other: Skin tear- R back  Last BM:  08/03/20  Height:   Ht Readings from Last 1 Encounters:  07/22/20 6' (1.829 m)    Weight:   Wt Readings from Last 1 Encounters:  08/04/20 89.3 kg    Ideal Body Weight:  80.9 kg  BMI:  Body mass index is 26.7 kg/m.  Estimated Nutritional Needs:   Kcal:  2300-2500 kcal  Protein:  115-130 grams  Fluid:  >/= 2 L/day    Loistine Chance, RD, LDN, [place] Registered Dietitian II Certified Diabetes Care and Education Specialist Please refer to [facility] for RD and/or RD on-call/weekend/after hours pager

## 2020-08-05 LAB — CBC
HCT: 28.8 % — ABNORMAL LOW (ref 39.0–52.0)
Hemoglobin: 8.8 g/dL — ABNORMAL LOW (ref 13.0–17.0)
MCH: 32.2 pg (ref 26.0–34.0)
MCHC: 30.6 g/dL (ref 30.0–36.0)
MCV: 105.5 fL — ABNORMAL HIGH (ref 80.0–100.0)
Platelets: 348 10*3/uL (ref 150–400)
RBC: 2.73 MIL/uL — ABNORMAL LOW (ref 4.22–5.81)
RDW: 13.1 % (ref 11.5–15.5)
WBC: 16.7 10*3/uL — ABNORMAL HIGH (ref 4.0–10.5)
nRBC: 0 % (ref 0.0–0.2)

## 2020-08-05 LAB — GLUCOSE, CAPILLARY
Glucose-Capillary: 201 mg/dL — ABNORMAL HIGH (ref 70–99)
Glucose-Capillary: 210 mg/dL — ABNORMAL HIGH (ref 70–99)
Glucose-Capillary: 228 mg/dL — ABNORMAL HIGH (ref 70–99)
Glucose-Capillary: 265 mg/dL — ABNORMAL HIGH (ref 70–99)
Glucose-Capillary: 303 mg/dL — ABNORMAL HIGH (ref 70–99)
Glucose-Capillary: 316 mg/dL — ABNORMAL HIGH (ref 70–99)

## 2020-08-05 LAB — BASIC METABOLIC PANEL
Anion gap: 8 (ref 5–15)
BUN: 49 mg/dL — ABNORMAL HIGH (ref 6–20)
CO2: 24 mmol/L (ref 22–32)
Calcium: 8.8 mg/dL — ABNORMAL LOW (ref 8.9–10.3)
Chloride: 109 mmol/L (ref 98–111)
Creatinine, Ser: 1.61 mg/dL — ABNORMAL HIGH (ref 0.61–1.24)
GFR, Estimated: 49 mL/min — ABNORMAL LOW (ref >60)
Glucose, Bld: 237 mg/dL — ABNORMAL HIGH (ref 70–99)
Potassium: 4.9 mmol/L (ref 3.5–5.1)
Sodium: 141 mmol/L (ref 135–145)

## 2020-08-05 NOTE — Progress Notes (Signed)
Pt does not complain of any pain. Pt's ADLs are done according to orders.Pt's needs are met and safety measures are maintained.

## 2020-08-05 NOTE — Progress Notes (Signed)
[facility] PROGRESS NOTE    Progress Note  HAWKEN BIELBY  VOP:[id number] DOB: 12-06-1960 DOA: 07/22/2020 PCP: Patient, No Pcp Per     Brief Narrative:   ROMAN DUBUC is an 59 y.o. male past medical history of essential hypertension chronic back pain who was admitted for cardiac arrest with downtime of 9 minutes out of hospital initial rhythm was V. fib receive shock and 6 rounds of epi before ROSC was achieved  Assessment/Plan:   Out of hospital V. fib due to acute ST elevation myocardial infarction (STEMI) ([place]) Post drug-eluting stent to the circumflex currently on aspirin and Brilinta.  Beta-Blockers and statins. Further management per cardiology. Amiodarone was stopped.  Ischemic cardiomyopathy: With an EF of 40% by echo done on this admission. Continue beta-blocker no ACE inhibitor due to renal disease. Continue hydralazine and isordil.  Acute respiratory failure with hypoxia: Now extubated speech evaluated the patient and deemed him very high risk for aspiration and they recommended n.p.o. Core track was placed. Satting greater 96% on 2 L.  Acute encephalopathy due to anoxic brain injury: Likely due to hypoxia in the setting of out of hospital V. fib arrest due to STEMI Core track in place continue tube feedings. Palliative care has been consulted and is discussing with family.  Acute on chronic kidney disease stage IIIb: Likely prerenal in the setting of V. Fib. With a baseline like around 1.6-2, continue free water per tube. Continue to follow basic metabolic panel intermittently.  Nonsustained SVT: Currently on amiodarone was added yesterday no episodes overnight continue Coreg, further management per cardiology.  Hypernatremia: Continue free water per tube.  Goals of care: Legally the son is the main medical decision maker however patient fianc and sisters are working together to determine what is best for the patient. Palliative care was consulted  and educated the patient on the medical situation and the significant of anoxic brain injury and compare best case scenario versus were case scenario. Decision regarding artificial feeding and benefits and complications of PEG tube were discussed. Palliative care will continue to work with family they are unsure on how to proceed they have a family meeting and will have further information. Family will need ongoing conversation and support as they navigate through this difficult decision.  DVT prophylaxis: lovenox Family Communication:Son Status is: Inpatient  Remains inpatient appropriate because:Hemodynamically unstable   Dispo: The patient is from: Home              Anticipated d/c is to: SNF              Anticipated d/c date is: > 3 days              Patient currently is not medically stable to d/c. Code Status:     Code Status Orders  (From admission, onward)         Start     Ordered   07/22/20 0719  Full code  Continuous        07/22/20 0719        Code Status History    Date Active Date Inactive Code Status Order ID Comments User Context   07/22/2020 0559 07/22/2020 0719 Full Code [id number]  Shellia Cleverly, MD Inpatient   Advance Care Planning Activity        IV Access:    Peripheral IV   Procedures and diagnostic studies:   No results found.   Medical Consultants:    None.  Anti-Infectives:  none  Subjective:    CHAUNCEY SCIULLI nonverbal  Objective:    Vitals:   08/05/20 0035 08/05/20 0331 08/05/20 0402 08/05/20 0415  BP: (!) 131/91 (!) 113/93 (!) 140/96   Pulse: (!) 102 (!) 101 97   Resp: _0 Temp: 98 F (36.7 C) 98.2 F (36.8 C) 98.5 F (36.9 C)   TempSrc: Oral Oral Oral   SpO2: 100% 100% 100%   Weight:    91 kg  Height:       SpO2: 100 % O2 Flow Rate (L/min): 5 L/min FiO2 (%): 36 %   Intake/Output Summary (Last 24 hours) at 08/05/2020 0957 Last data filed at 08/05/2020 0659 Gross per 24 hour  Intake 1114.17 ml   Output 1650 ml  Net -535.83 ml   Filed Weights   08/03/20 0510 08/04/20 0359 08/05/20 0415  Weight: 90.5 kg 89.3 kg 91 kg    Exam: General exam: In no acute distress. Respiratory system: Good air movement and clear to auscultation. Cardiovascular system: S1 & S2 heard, RRR. No JVD. Gastrointestinal system: Abdomen is nondistended, soft and nontender.  Extremities: No pedal edema. Skin: No rashes, lesions or ulcers  Data Reviewed:    Labs: Basic Metabolic Panel: Recent Labs  Lab 07/30/20 0103 07/31/20 0111 08/01/20 0039 08/02/20 0340 08/03/20 0232 08/04/20 0301 08/05/20 0342  NA 142 145 146* 144 146* 146* 141  K 4.4 4.4 4.7 4.7 4.6 4.9 4.9  CL 112* 114* 114* 112* 112* 112* 109  CO2 19* 19* _1 GLUCOSE 289* 212* 141* 155* 127* 246* 237*  BUN 49* 65* 50* 58* 63* 53* 49*  CREATININE 1.63* 2.00* 1.57* 1.73* 1.89* 1.74* 1.61*  CALCIUM 9.1 9.0 9.5 9.3 9.0 9.1 8.8*  MG 2.3 2.3 2.3 2.6*  --   --   --   PHOS 3.7 3.9 4.6  --   --   --   --    GFR Estimated Creatinine Clearance: 54.9 mL/min (A) (by C-G formula based on SCr of 1.61 mg/dL (H)). Liver Function Tests: No results for input(s): AST, ALT, ALKPHOS, BILITOT, PROT, ALBUMIN in the last 168 hours. No results for input(s): LIPASE, AMYLASE in the last 168 hours. No results for input(s): AMMONIA in the last 168 hours. Coagulation profile No results for input(s): INR, PROTIME in the last 168 hours. COVID-19 Labs  No results for input(s): DDIMER, FERRITIN, LDH, CRP in the last 72 hours.  Lab Results  Component Value Date   [place] NEGATIVE 07/22/2020    CBC: Recent Labs  Lab 07/31/20 2221 08/01/20 0039 08/03/20 0232 08/04/20 0301 08/05/20 0342  WBC 24.4* 20.6* 22.9* 18.5* 16.7*  HGB 10.7* 10.8* 10.4* 9.8* 8.8*  HCT 34.8* 33.1* 32.2* 30.3* 28.8*  MCV 105.8* 103.4* 104.2* 104.5* 105.5*  PLT 310 315 366 355 348   Cardiac Enzymes: No results for input(s): CKTOTAL, CKMB, CKMBINDEX, TROPONINI  in the last 168 hours. BNP (last 3 results) No results for input(s): PROBNP in the last 8760 hours. CBG: Recent Labs  Lab 08/04/20 2021 08/04/20 2317 08/05/20 0037 08/05/20 0404 08/05/20 0720  GLUCAP 246* 269* 228* 201* 210*   D-Dimer: No results for input(s): DDIMER in the last 72 hours. Hgb A1c: No results for input(s): HGBA1C in the last 72 hours. Lipid Profile: No results for input(s): CHOL, HDL, LDLCALC, TRIG, CHOLHDL, LDLDIRECT in the last 72 hours. Thyroid function studies: No results for input(s): TSH, T4TOTAL, T3FREE, THYROIDAB in the last 72 hours.  Invalid input(s): FREET3 Anemia work up: No results for input(s): VITAMINB12, FOLATE, FERRITIN, TIBC, IRON, RETICCTPCT in the last 72 hours. Sepsis Labs: Recent Labs  Lab 08/01/20 0039 08/03/20 0232 08/04/20 0301 08/05/20 0342  WBC 20.6* 22.9* 18.5* 16.7*   Microbiology No results found for this or any previous visit (from the past 240 hour(s)).   Medications:   . aspirin  81 mg Per Tube Daily  . atorvastatin  80 mg Per Tube Daily  . bethanechol  10 mg Per Tube TID  . carvedilol  25 mg Per Tube BID WC  . chlorhexidine gluconate (MEDLINE KIT)  15 mL Mouth Rinse BID  . Chlorhexidine Gluconate Cloth  6 each Topical Daily  . feeding supplement (PROSource TF)  45 mL Per Tube BID  . free water  200 mL Per Tube Q3H  . heparin  5,000 Units Subcutaneous Q8H  . hydrALAZINE  50 mg Per Tube Q8H  . insulin aspart  0-20 Units Subcutaneous Q4H  . isosorbide dinitrate  30 mg Per Tube TID  . mouth rinse  15 mL Mouth Rinse BID  . polyvinyl alcohol  1 drop Both Eyes QID  . sodium chloride flush  3 mL Intravenous Q12H  . ticagrelor  90 mg Per Tube BID   Continuous Infusions: . sodium chloride Stopped (07/22/20 0955)  . feeding supplement (OSMOLITE 1.5 CAL) 1,000 mL (08/04/20 1702)      LOS: 14 days   Charlynne Cousins  [facility]  08/05/2020, 9:57 AM

## 2020-08-06 ENCOUNTER — Inpatient Hospital Stay (HOSPITAL_COMMUNITY): Payer: Medicaid Other

## 2020-08-06 LAB — BASIC METABOLIC PANEL
Anion gap: 8 (ref 5–15)
BUN: 51 mg/dL — ABNORMAL HIGH (ref 6–20)
CO2: 25 mmol/L (ref 22–32)
Calcium: 8.8 mg/dL — ABNORMAL LOW (ref 8.9–10.3)
Chloride: 109 mmol/L (ref 98–111)
Creatinine, Ser: 1.67 mg/dL — ABNORMAL HIGH (ref 0.61–1.24)
GFR, Estimated: 47 mL/min — ABNORMAL LOW (ref >60)
Glucose, Bld: 277 mg/dL — ABNORMAL HIGH (ref 70–99)
Potassium: 4.9 mmol/L (ref 3.5–5.1)
Sodium: 142 mmol/L (ref 135–145)

## 2020-08-06 LAB — GLUCOSE, CAPILLARY
Glucose-Capillary: 173 mg/dL — ABNORMAL HIGH (ref 70–99)
Glucose-Capillary: 191 mg/dL — ABNORMAL HIGH (ref 70–99)
Glucose-Capillary: 236 mg/dL — ABNORMAL HIGH (ref 70–99)
Glucose-Capillary: 257 mg/dL — ABNORMAL HIGH (ref 70–99)
Glucose-Capillary: 257 mg/dL — ABNORMAL HIGH (ref 70–99)
Glucose-Capillary: 261 mg/dL — ABNORMAL HIGH (ref 70–99)

## 2020-08-06 IMAGING — DX DG ABDOMEN 1V
1 series · 2 of 2 positions shown · non-contrast
Comparison: Abdominal radiograph from earlier today

CLINICAL DATA: Enteric tube placement

EXAM:
ABDOMEN - 1 VIEW

[Series 1: abdomen · 0.14mm/px · 2 of 2 slices shown]
[im 1/2]
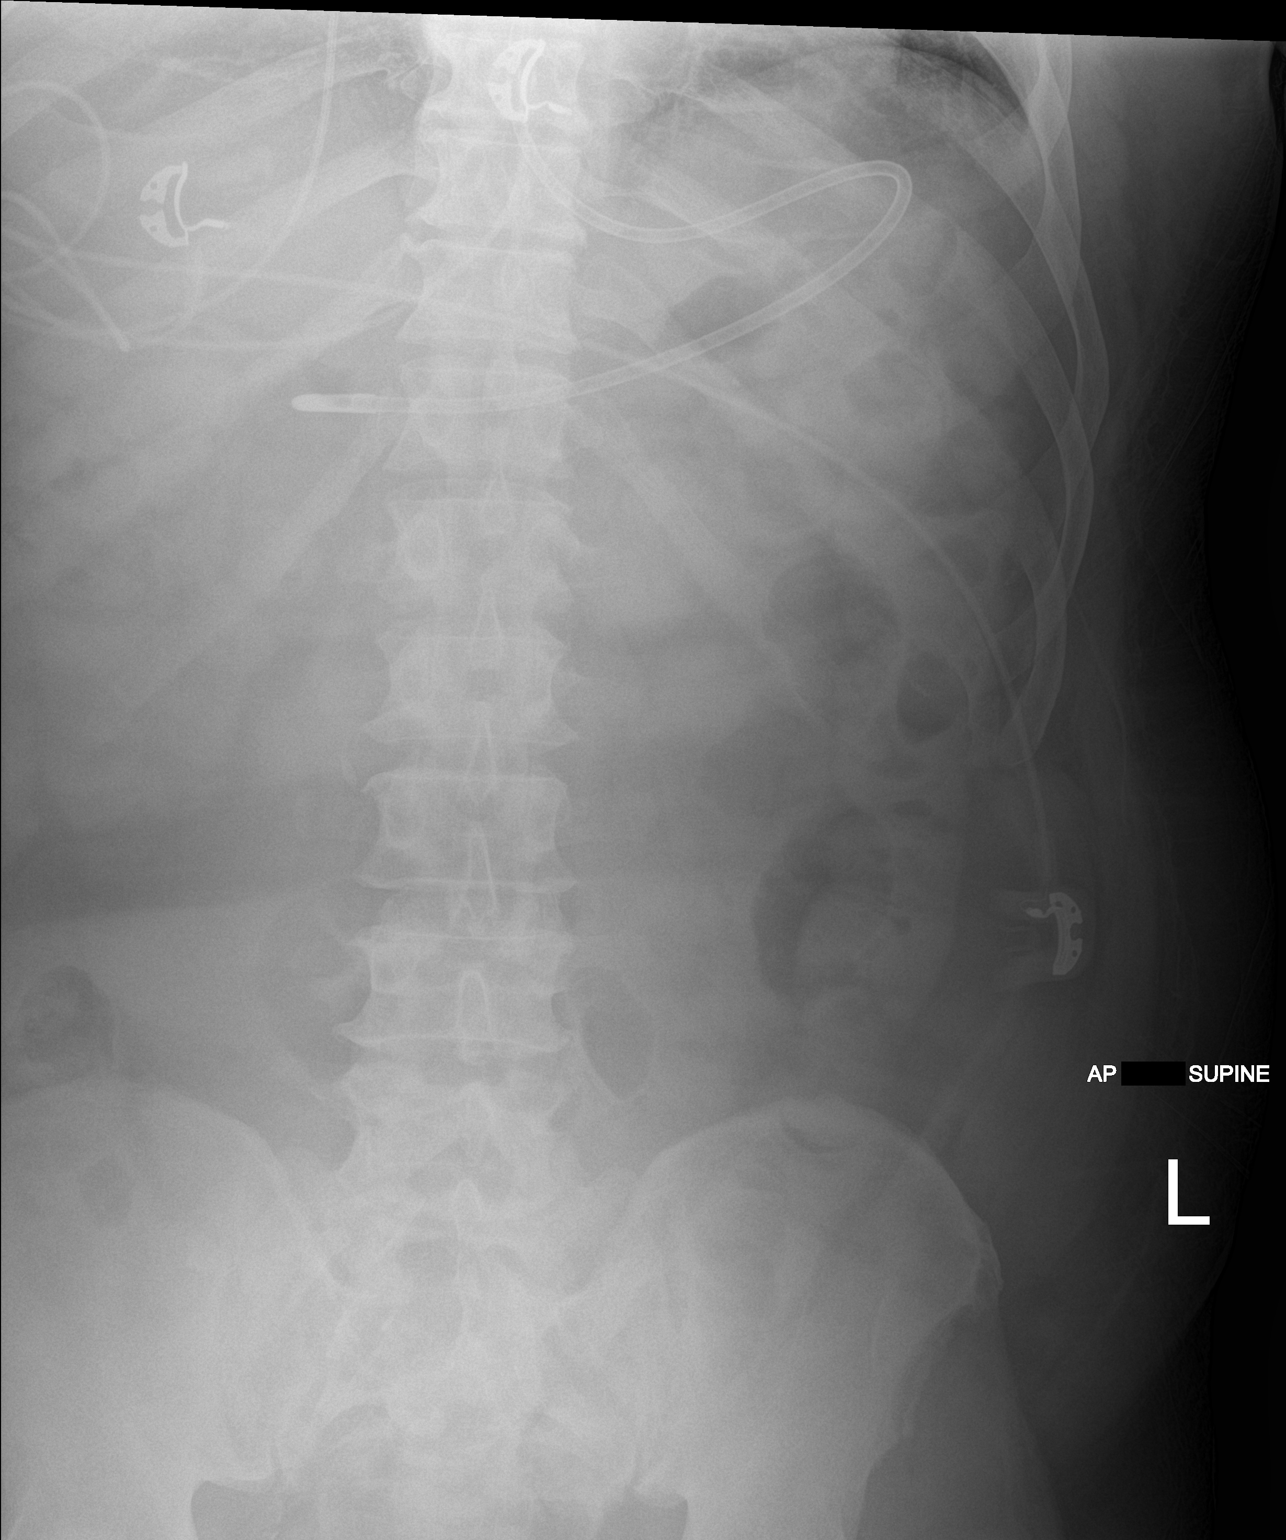
[im 2/2]
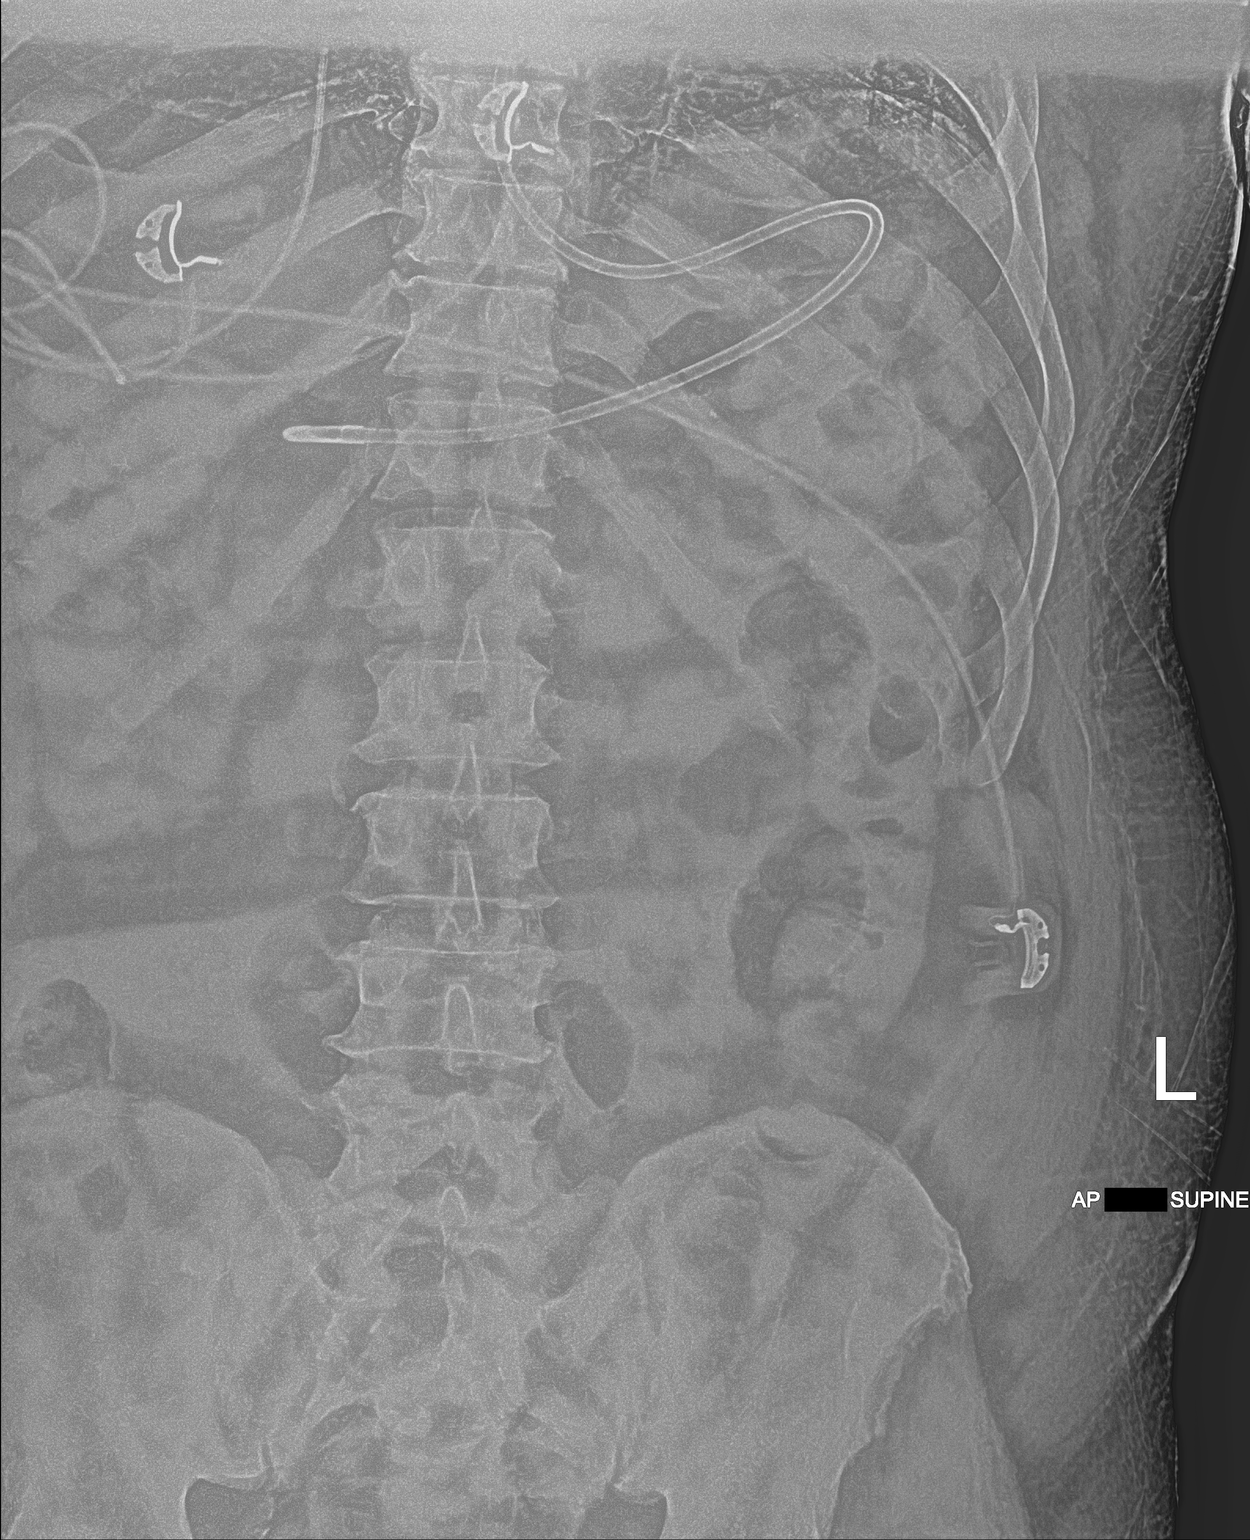

[2 of 2 positions shown; findings below may reference images not displayed]

FINDINGS: The enteric tube tip in the distal stomach. No disproportionately
dilated small bowel loops. No evidence of pneumatosis or
pneumoperitoneum.
IMPRESSION: Enteric tube tip in the distal stomach.

## 2020-08-06 IMAGING — DX DG ABDOMEN 1V
1 series · 1 of 1 positions shown · non-contrast
Comparison: [DATE] at [DATE] p.m.

CLINICAL DATA: Enteric catheter placement

EXAM:
ABDOMEN - 1 VIEW

[abdomen]
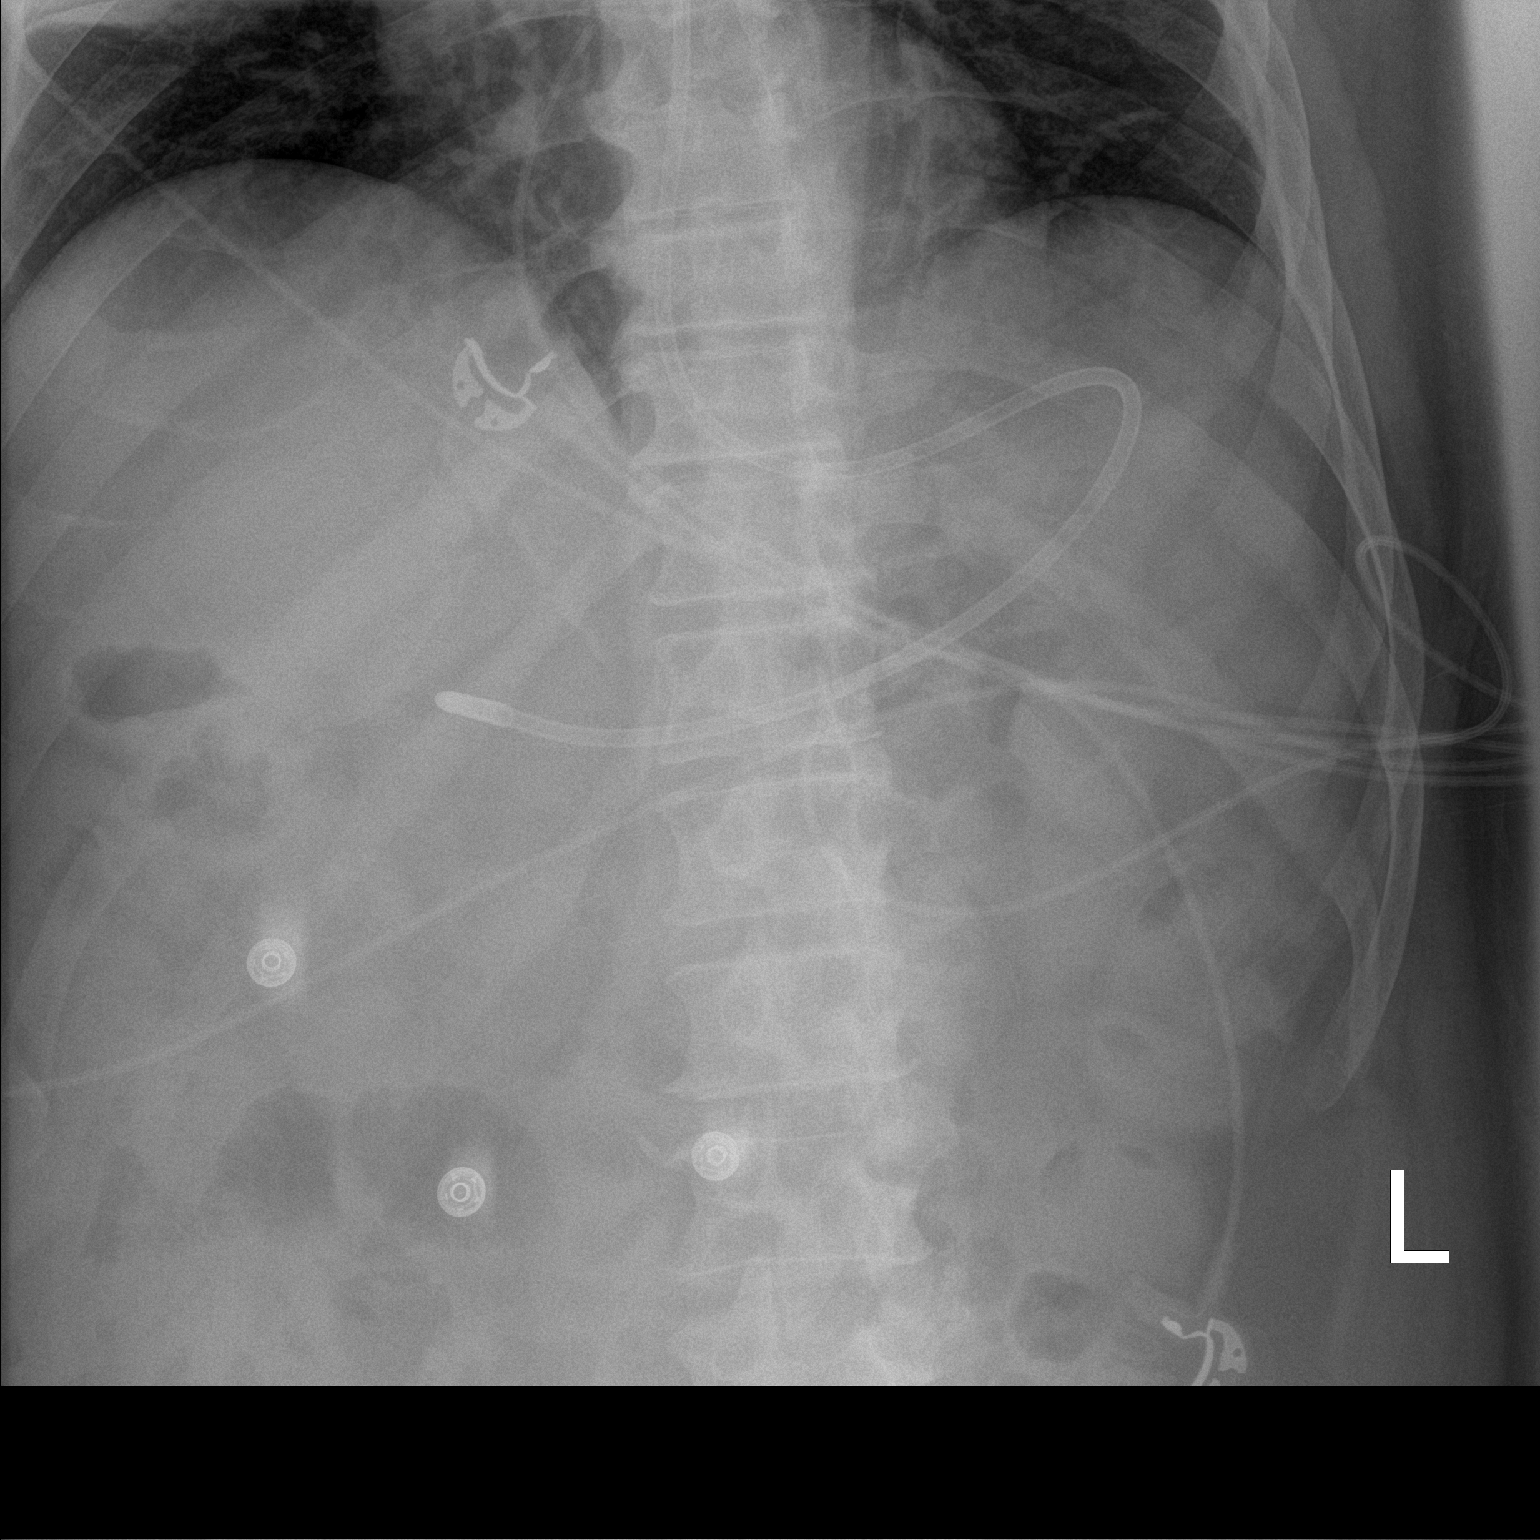

[1 of 1 positions shown; findings below may reference images not displayed]

FINDINGS: Frontal view of the lower chest and upper abdomen demonstrates
stable position of the enteric catheter, tip overlying the gastric
antrum. Bowel gas pattern is unremarkable.
IMPRESSION: 1. Enteric catheter tip overlying gastric antrum.

## 2020-08-06 IMAGING — DX DG ABD PORTABLE 1V
1 series · 1 of 1 positions shown · non-contrast
Comparison: None.

CLINICAL DATA: Migration of nasogastric tube.

EXAM:
PORTABLE ABDOMEN - 1 VIEW

[abdomen kub]
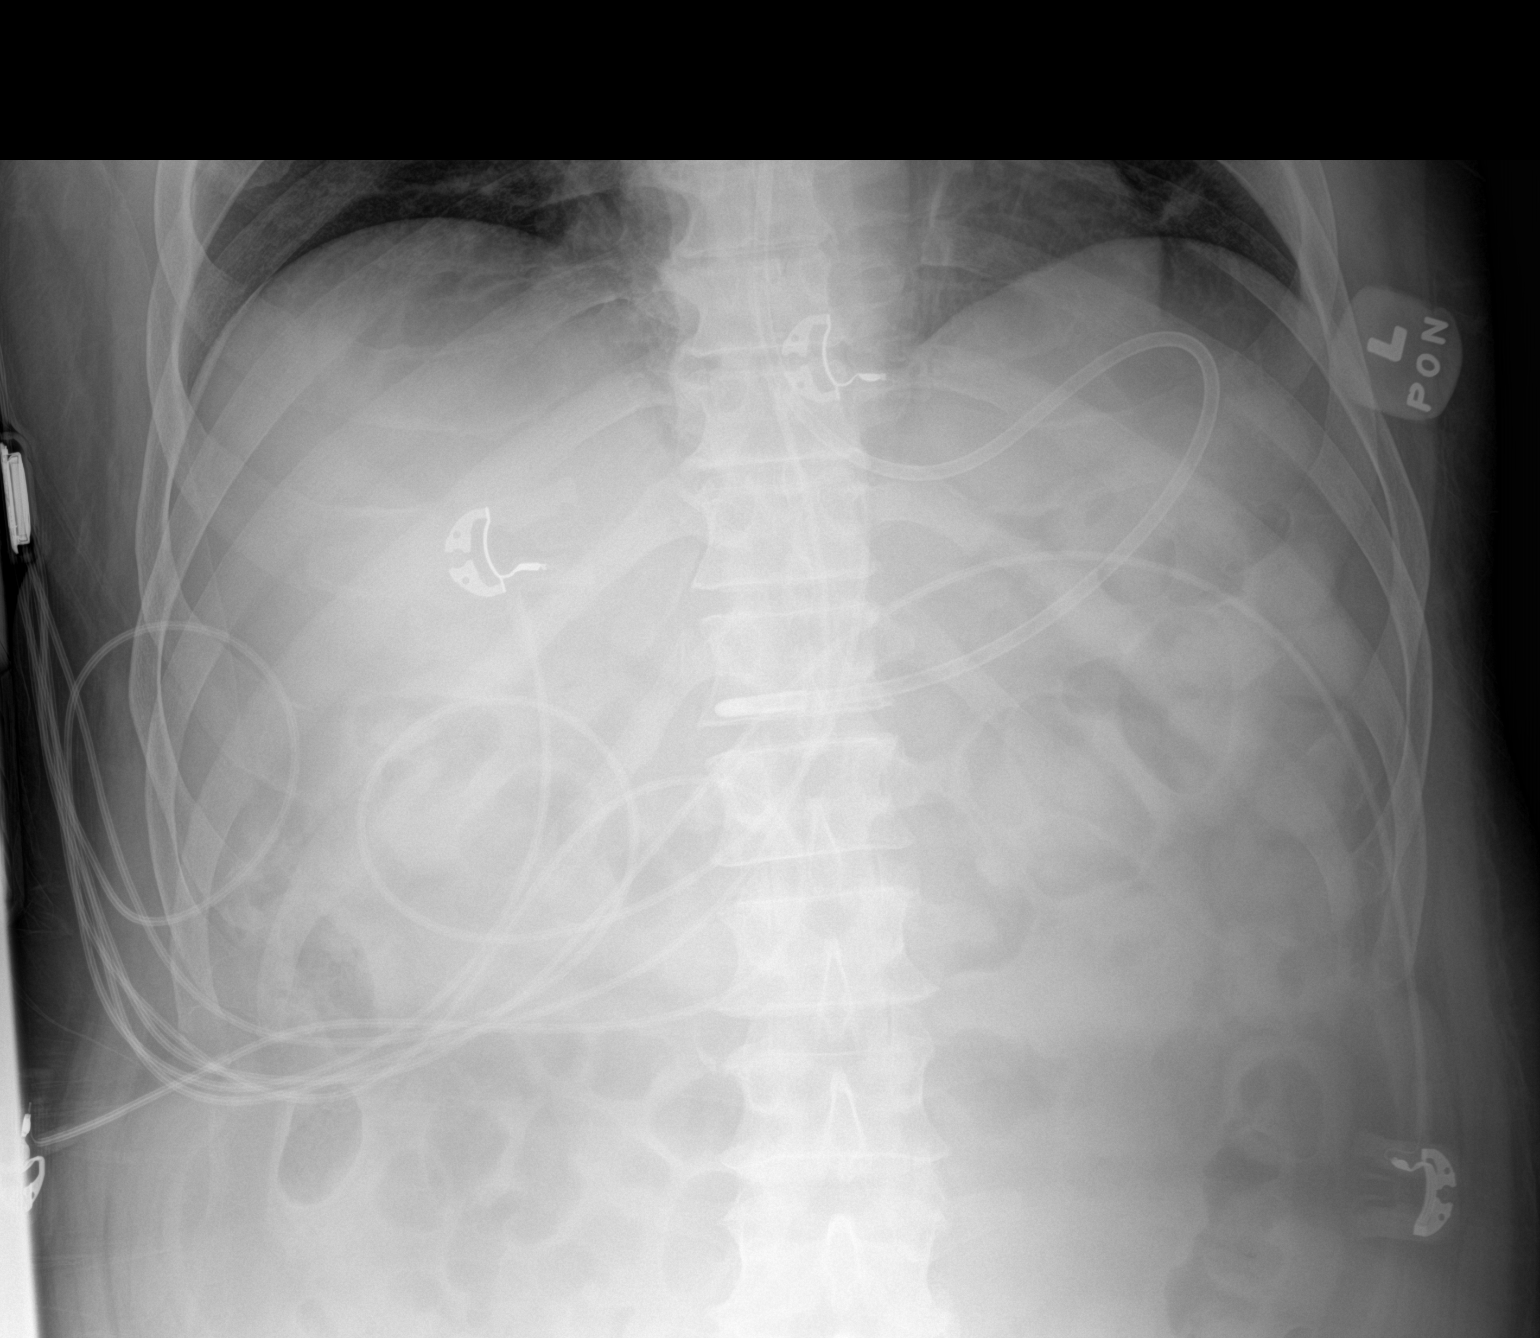

[1 of 1 positions shown; findings below may reference images not displayed]

FINDINGS: The previous NG tube appears to been removed. A feeding tube is been
placed in the interval with the distal tip in the region of the
distal stomach. Recommend advancement before use.
IMPRESSION: The distal tip of the feeding tube is in the region of the distal
stomach. Recommend advancement before use.

## 2020-08-06 NOTE — Progress Notes (Signed)
[facility] PROGRESS NOTE    Progress Note  Antonio Navarro  QBV:[id number] DOB: 04-06-1961 DOA: 07/22/2020 PCP: Patient, No Pcp Per     Brief Narrative:   Antonio Navarro is an 58 y.o. male past medical history of essential hypertension chronic back pain who was admitted for cardiac arrest with downtime of 9 minutes out of hospital initial rhythm was V. fib receive shock and 6 rounds of epi before ROSC was achieved  Assessment/Plan:   Out of hospital V. fib due to acute ST elevation myocardial infarction (STEMI) ([place]) Post drug-eluting stent to the circumflex currently on aspirin and Brilinta.  Beta-Blockers and statins. Cardiology has signed off recommended to continue current management. Amiodarone was stopped.  Ischemic cardiomyopathy: With an EF of 40% by echo done on this admission. Continue beta-blocker no ACE inhibitor due to renal disease. Continue hydralazine and isordil.  Acute respiratory failure with hypoxia: Now extubated speech evaluated the patient and deemed him very high risk for aspiration and they recommended n.p.o. Core track was placed. Satting greater 96% on 2 L.  Acute encephalopathy due to anoxic brain injury: Likely due to hypoxia in the setting of out of hospital V. fib arrest due to STEMI Core track in place continue tube feedings. Palliative care has been consulted and is discussing with family.  Acute on chronic kidney disease stage IIIb: Likely prerenal in the setting of V. Fib. With a baseline like around 1.6-2, continue free water per tube. Continue to follow basic metabolic panel intermittently.  Nonsustained SVT: Currently on amiodarone was added yesterday no episodes overnight continue Coreg, further management per cardiology.  Hypernatremia: Continue free water per tube.  Goals of care: Legally the son is the main medical decision maker however patient fianc and sisters are working together to determine what is best for the  patient. Palliative care was consulted and educated the patient on the medical situation and the significant of anoxic brain injury and compare best case scenario versus were case scenario. Decision regarding artificial feeding and benefits and complications of PEG tube were discussed. Palliative care will continue to work with family they are unsure on how to proceed they have a family meeting and will have further information. Family will need ongoing conversation and support as they navigate through this difficult decision.  DVT prophylaxis: lovenox Family Communication:Son Status is: Inpatient  Remains inpatient appropriate because:Hemodynamically unstable   Dispo: The patient is from: Home              Anticipated d/c is to: SNF              Anticipated d/c date is: > 3 days              Patient currently is not medically stable to d/c. Code Status:     Code Status Orders  (From admission, onward)         Start     Ordered   07/22/20 0719  Full code  Continuous        07/22/20 0719        Code Status History    Date Active Date Inactive Code Status Order ID Comments User Context   07/22/2020 0559 07/22/2020 0719 Full Code [id number]  Shellia Cleverly, MD Inpatient   Advance Care Planning Activity        IV Access:    Peripheral IV   Procedures and diagnostic studies:   No results found.   Medical Consultants:  None.  Anti-Infectives:   none  Subjective:    Antonio Navarro nonverbal  Objective:    Vitals:   08/05/20 1532 08/05/20 2009 08/06/20 0346 08/06/20 0849  BP: 135/82 (!) 130/96 140/82 (!) 148/98  Pulse:  95 96 95  Resp:  '18 19 19  ' Temp:  97.8 F (36.6 C) 98 F (36.7 C) 98.3 F (36.8 C)  TempSrc:  Oral Oral Oral  SpO2:  100% 100% 100%  Weight:   91.5 kg   Height:       SpO2: 100 % O2 Flow Rate (L/min): 4 L/min FiO2 (%): 36 %   Intake/Output Summary (Last 24 hours) at 08/06/2020 1027 Last data filed at 08/06/2020  0851 Gross per 24 hour  Intake 3 ml  Output 1500 ml  Net -1497 ml   Filed Weights   08/04/20 0359 08/05/20 0415 08/06/20 0346  Weight: 89.3 kg 91 kg 91.5 kg    Exam: General exam: In no acute distress. Respiratory system: Good air movement and clear to auscultation. Cardiovascular system: S1 & S2 heard, RRR. No JVD. Gastrointestinal system: Abdomen is nondistended, soft and nontender.  Extremities: No pedal edema. Skin: No rashes, lesions or ulcers  Data Reviewed:    Labs: Basic Metabolic Panel: Recent Labs  Lab 07/31/20 0111 08/01/20 0039 08/02/20 0340 08/03/20 0232 08/04/20 0301 08/05/20 0342 08/06/20 0138  NA 145 146* 144 146* 146* 141 142  K 4.4 4.7 4.7 4.6 4.9 4.9 4.9  CL 114* 114* 112* 112* 112* 109 109  CO2 19* '22 22 23 23 24 25  ' GLUCOSE 212* 141* 155* 127* 246* 237* 277*  BUN 65* 50* 58* 63* 53* 49* 51*  CREATININE 2.00* 1.57* 1.73* 1.89* 1.74* 1.61* 1.67*  CALCIUM 9.0 9.5 9.3 9.0 9.1 8.8* 8.8*  MG 2.3 2.3 2.6*  --   --   --   --   PHOS 3.9 4.6  --   --   --   --   --    GFR Estimated Creatinine Clearance: 52.9 mL/min (A) (by C-G formula based on SCr of 1.67 mg/dL (H)). Liver Function Tests: No results for input(s): AST, ALT, ALKPHOS, BILITOT, PROT, ALBUMIN in the last 168 hours. No results for input(s): LIPASE, AMYLASE in the last 168 hours. No results for input(s): AMMONIA in the last 168 hours. Coagulation profile No results for input(s): INR, PROTIME in the last 168 hours. COVID-19 Labs  No results for input(s): DDIMER, FERRITIN, LDH, CRP in the last 72 hours.  Lab Results  Component Value Date   [place] NEGATIVE 07/22/2020    CBC: Recent Labs  Lab 07/31/20 2221 08/01/20 0039 08/03/20 0232 08/04/20 0301 08/05/20 0342  WBC 24.4* 20.6* 22.9* 18.5* 16.7*  HGB 10.7* 10.8* 10.4* 9.8* 8.8*  HCT 34.8* 33.1* 32.2* 30.3* 28.8*  MCV 105.8* 103.4* 104.2* 104.5* 105.5*  PLT 310 315 366 355 348   Cardiac Enzymes: No results for input(s):  CKTOTAL, CKMB, CKMBINDEX, TROPONINI in the last 168 hours. BNP (last 3 results) No results for input(s): PROBNP in the last 8760 hours. CBG: Recent Labs  Lab 08/05/20 1629 08/05/20 2003 08/06/20 0020 08/06/20 0340 08/06/20 0813  GLUCAP 265* 303* 257* 257* 191*   D-Dimer: No results for input(s): DDIMER in the last 72 hours. Hgb A1c: No results for input(s): HGBA1C in the last 72 hours. Lipid Profile: No results for input(s): CHOL, HDL, LDLCALC, TRIG, CHOLHDL, LDLDIRECT in the last 72 hours. Thyroid function studies: No results for input(s): TSH, T4TOTAL, T3FREE,  THYROIDAB in the last 72 hours.  Invalid input(s): FREET3 Anemia work up: No results for input(s): VITAMINB12, FOLATE, FERRITIN, TIBC, IRON, RETICCTPCT in the last 72 hours. Sepsis Labs: Recent Labs  Lab 08/01/20 0039 08/03/20 0232 08/04/20 0301 08/05/20 0342  WBC 20.6* 22.9* 18.5* 16.7*   Microbiology No results found for this or any previous visit (from the past 240 hour(s)).   Medications:   . aspirin  81 mg Per Tube Daily  . atorvastatin  80 mg Per Tube Daily  . bethanechol  10 mg Per Tube TID  . carvedilol  25 mg Per Tube BID WC  . chlorhexidine gluconate (MEDLINE KIT)  15 mL Mouth Rinse BID  . Chlorhexidine Gluconate Cloth  6 each Topical Daily  . feeding supplement (PROSource TF)  45 mL Per Tube BID  . free water  200 mL Per Tube Q3H  . heparin  5,000 Units Subcutaneous Q8H  . hydrALAZINE  50 mg Per Tube Q8H  . insulin aspart  0-20 Units Subcutaneous Q4H  . isosorbide dinitrate  30 mg Per Tube TID  . mouth rinse  15 mL Mouth Rinse BID  . polyvinyl alcohol  1 drop Both Eyes QID  . sodium chloride flush  3 mL Intravenous Q12H  . ticagrelor  90 mg Per Tube BID   Continuous Infusions: . sodium chloride Stopped (07/22/20 0955)  . feeding supplement (OSMOLITE 1.5 CAL) 1,000 mL (08/04/20 1702)      LOS: 15 days   Charlynne Cousins  [facility]  08/06/2020, 10:27 AM

## 2020-08-06 NOTE — Progress Notes (Signed)
Pt does not complain of any pain.Pt's needs are met and safety measures are maintained. Pt had multiple x-rays of his abdomen due to pulling on it. Mittens are on th pt as of when this note is documented.

## 2020-08-06 NOTE — Progress Notes (Signed)
Patient has pulled cortrak by approximately one inch from mark at nare entry. Paused feeding, MD notified.

## 2020-08-06 NOTE — Progress Notes (Signed)
Pt pulled on his NG tube. Initial diagnotic test conformed it is in the abdomen but recommends advancement. Air check is done by two nurses and another abdomen xray ordered. Doctor has given order for another placement of NG tube if needed

## 2020-08-07 LAB — GLUCOSE, CAPILLARY
Glucose-Capillary: 258 mg/dL — ABNORMAL HIGH (ref 70–99)
Glucose-Capillary: 273 mg/dL — ABNORMAL HIGH (ref 70–99)
Glucose-Capillary: 292 mg/dL — ABNORMAL HIGH (ref 70–99)
Glucose-Capillary: 303 mg/dL — ABNORMAL HIGH (ref 70–99)
Glucose-Capillary: 333 mg/dL — ABNORMAL HIGH (ref 70–99)
Glucose-Capillary: 373 mg/dL — ABNORMAL HIGH (ref 70–99)

## 2020-08-07 MED ORDER — INSULIN DETEMIR 100 UNIT/ML ~~LOC~~ SOLN
15.0000 [IU] | Freq: Two times a day (BID) | SUBCUTANEOUS | Status: DC
  Administered 2020-08-07 – 2020-08-09 (×5): 15 [IU] via SUBCUTANEOUS
  Filled 2020-08-07 (×6): qty 0.15

## 2020-08-07 NOTE — Progress Notes (Signed)
Patient ID: Antonio Navarro, male   DOB: 02/05/1961, 59 y.o.   MRN: [id number]  This NP visited patient at the bedside as a follow up to last week' s GOCs meeting, and for palliative medicine needs and emotional support.  Reviewed medical records, collaborated with attending/Dr. Venetia Constable and bedside nursing, and LCSW regarding the case.  Patient is stable on room air,  however he remains with significant residual effects of hypoxic event.  He is unable to safely tolerate orals/dysphagia, and unable to follow commands. Patient does not have decisional capacity.  Important decisions are pending for [name] specifically to artificial nutrition and hydration.   I left a telephone message for the  patient's son /Shaun he is  main medical decision contact.    Recommended family meeting to readdress goals of care.  Await callback.   Family will need ongoing conversation/support as they navigate difficult decisions  Total time spent on the unit was 15 minutes  Greater than 50% of the time was spent in counseling and coordination of care  Wadie Lessen NP  Palliative Medicine Team Team Phone # [phone] Pager [phone]

## 2020-08-07 NOTE — Plan of Care (Signed)
  Problem: Nutrition: Goal: Adequate nutrition will be maintained Outcome: Progressing   

## 2020-08-07 NOTE — Progress Notes (Signed)
Cardiology signed off on 08/04/20. Please let us know when he discharges and we will arrange follow up - in office vs virtual visit depending on disposition and function.

## 2020-08-07 NOTE — Progress Notes (Signed)
  Speech Language Pathology Treatment: Dysphagia  Patient Details Name: Antonio Navarro MRN: [id number] DOB: [birth year] Today's Date: 08/07/2020 Time: 7530-0511 SLP Time Calculation (min) (ACUTE ONLY): 14 min  Assessment / Plan / Recommendation Clinical Impression  Attempted to give pt PO trials but he is resistant today, turning his head away from most POs offered even if initially he nods his head "yes" to wanting them. When he passively gets some boluses in his mouth, he spits them all back out. No swallows were elicited. Will continue efforts.    HPI HPI: Pt is a 59 yo male s/p arrest with downtime 9 minutes. Initial rhythm was V. fib, shocked and received 6 rounds of epi before ROSC achieved.  Total CPR time was 30 minutes. ETT 12/4-12/13. MRI 12/7 suggestive of a widespread anoxic injury. PMH includes kidney stones and HTN.      SLP Plan  Continue with current plan of care       Recommendations  Diet recommendations: NPO Medication Administration: Via alternative means                Oral Care Recommendations: Oral care QID Follow up Recommendations:  (tba) SLP Visit Diagnosis: Dysphagia, unspecified (R13.10) Plan: Continue with current plan of care       GO                Osie Bond., M.A. [facility] Pager [phone] Office [phone]  08/07/2020, 1:50 PM

## 2020-08-07 NOTE — Progress Notes (Signed)
[facility] PROGRESS NOTE    Progress Note  Antonio Navarro  [name] DOB: May 30, 1961 DOA: 07/22/2020 PCP: Patient, No Pcp Per     Brief Narrative:   Antonio Navarro is an 59 y.o. male past medical history of essential hypertension chronic back pain who was admitted for cardiac arrest with downtime of 9 minutes out of hospital initial rhythm was V. fib receive shock and 6 rounds of epi before ROSC was achieved he was found to have a STEMI he status post cath with a drug-eluting stent to the circumflex currently on Brilinta and aspirin along with beta-blockers and statins.  Who was left with anoxic brain injury.  He was also in acute renal failure due to V. fib arrest and his creatinine has improved with IV fluid hydration. A 2D echo was done that showed an EF of 40%.on admission.He is currently on a beta-blocker and not on an ACE inhibitor due to renal failure.  Cardiology recommended to continue hydralazine and Isordil .  He Got high risk of aspiration and is currently n.p.o. with a core track.  Palliative care was consulted as his chances of meaningful recovery are nil.  Palliative care is working with the family to try to guide his treatment as they are unsure how to proceed.  He is uninsured so placement will be difficult.  Palliative care is currently working with the family  Assessment/Plan:   Out of hospital V. fib due to acute ST elevation myocardial infarction (STEMI) ([place]) Post drug-eluting stent to the circumflex currently on aspirin and Brilinta.  Beta-Blockers and statins. Cardiology has signed off recommended to continue current management and to be notified for follow-up visit as an outpatient. Amiodarone was stopped.  Ischemic cardiomyopathy: With an EF of 40% by echo done on this admission. Continue beta-blocker no ACE inhibitor due to renal disease. Continue hydralazine and isordil.  Acute respiratory failure with hypoxia: Now extubated speech evaluated the  patient and deemed him very high risk for aspiration and they recommended n.p.o. Core track was placed. Satting greater 96% on 2 L.  Acute encephalopathy due to anoxic brain injury: Likely due to hypoxia in the setting of out of hospital V. fib arrest due to STEMI Core track in place continue tube feedings. Palliative care has been consulted and is discussing with family.  Acute on chronic kidney disease stage IIIb: Likely prerenal in the setting of V. Fib. With a baseline like around 1.6-2, continue free water per tube. Continue to follow basic metabolic panel intermittently.  Nonsustained SVT: Currently on amiodarone was added yesterday no episodes overnight continue Coreg, further management per cardiology.  Hypernatremia: Continue free water per tube.  Hyperglycemia: With an A1c of 4.7, he is currently on tube feedings we have him on Lantus plus sliding scale.  Goals of care: Legally the son is the main medical decision maker however patient fianc and sisters are working together to determine what is best for the patient. Palliative care was consulted and educated the patient on the medical situation and the significant of anoxic brain injury and compare best case scenario versus were case scenario. Decision regarding artificial feeding and benefits and complications of PEG tube were discussed. Palliative care will continue to work with family they are unsure on how to proceed they have a family meeting and will have further information. Family will need ongoing conversation and support as they navigate through this difficult decision.  DVT prophylaxis: lovenox Family Communication:Son Status is: Inpatient  Remains inpatient  appropriate because:Hemodynamically unstable   Dispo: The patient is from: Home              Anticipated d/c is to: SNF              Anticipated d/c date is: > 3 days              Patient currently is not medically stable to d/c. Code Status:      Code Status Orders  (From admission, onward)         Start     Ordered   07/22/20 0719  Full code  Continuous        07/22/20 0719        Code Status History    Date Active Date Inactive Code Status Order ID Comments User Context   07/22/2020 0559 07/22/2020 0719 Full Code [id number]  Shellia Cleverly, MD Inpatient   Advance Care Planning Activity        IV Access:    Peripheral IV   Procedures and diagnostic studies:   DG Abd 1 View  Result Date: 08/06/2020 CLINICAL DATA:  Enteric catheter placement EXAM: ABDOMEN - 1 VIEW COMPARISON:  08/06/2020 at 1:13 p.m. FINDINGS: Frontal view of the lower chest and upper abdomen demonstrates stable position of the enteric catheter, tip overlying the gastric antrum. Bowel gas pattern is unremarkable. IMPRESSION: 1. Enteric catheter tip overlying gastric antrum. Electronically Signed   By: Randa Ngo M.D.   On: 08/06/2020 16:20   DG Abd 1 View  Result Date: 08/06/2020 CLINICAL DATA:  Enteric tube placement EXAM: ABDOMEN - 1 VIEW COMPARISON:  Abdominal radiograph from earlier today FINDINGS: The enteric tube tip in the distal stomach. No disproportionately dilated small bowel loops. No evidence of pneumatosis or pneumoperitoneum. IMPRESSION: Enteric tube tip in the distal stomach. Electronically Signed   By: Ilona Sorrel M.D.   On: 08/06/2020 13:42   DG Abd Portable 1V  Result Date: 08/06/2020 CLINICAL DATA:  Migration of nasogastric tube. EXAM: PORTABLE ABDOMEN - 1 VIEW COMPARISON:  None. FINDINGS: The previous NG tube appears to been removed. A feeding tube is been placed in the interval with the distal tip in the region of the distal stomach. Recommend advancement before use. IMPRESSION: The distal tip of the feeding tube is in the region of the distal stomach. Recommend advancement before use. Electronically Signed   By: Dorise Bullion III M.D   On: 08/06/2020 11:03     Medical Consultants:    None.  Anti-Infectives:    none  Subjective:    Antonio Navarro nonverbal.  Objective:    Vitals:   08/07/20 0428 08/07/20 0428 08/07/20 0808 08/07/20 1009  BP: 134/90 134/90 (!) 149/91 (!) 159/93  Pulse:  (!) 103 100 (!) 101  Resp:  20 (!) 21   Temp:  98.5 F (36.9 C) 97.7 F (36.5 C)   TempSrc:  Oral Oral   SpO2:  100% 99%   Weight:      Height:       SpO2: 99 % O2 Flow Rate (L/min): 4 L/min FiO2 (%): 36 %   Intake/Output Summary (Last 24 hours) at 08/07/2020 1032 Last data filed at 08/07/2020 1020 Gross per 24 hour  Intake 6925.05 ml  Output 1250 ml  Net 5675.05 ml   Filed Weights   08/05/20 0415 08/06/20 0346 08/07/20 0044  Weight: 91 kg 91.5 kg 90.1 kg    Exam: General exam: In no acute distress. Respiratory  system: Good air movement and clear to auscultation. Cardiovascular system: S1 & S2 heard, RRR. No JVD. Gastrointestinal system: Abdomen is nondistended, soft and nontender.  Extremities: No pedal edema. Skin: No rashes, lesions or ulcers  Data Reviewed:    Labs: Basic Metabolic Panel: Recent Labs  Lab 08/01/20 0039 08/02/20 0340 08/03/20 0232 08/04/20 0301 08/05/20 0342 08/06/20 0138  NA 146* 144 146* 146* 141 142  K 4.7 4.7 4.6 4.9 4.9 4.9  CL 114* 112* 112* 112* 109 109  CO2 '22 22 23 23 24 25  ' GLUCOSE 141* 155* 127* 246* 237* 277*  BUN 50* 58* 63* 53* 49* 51*  CREATININE 1.57* 1.73* 1.89* 1.74* 1.61* 1.67*  CALCIUM 9.5 9.3 9.0 9.1 8.8* 8.8*  MG 2.3 2.6*  --   --   --   --   PHOS 4.6  --   --   --   --   --    GFR Estimated Creatinine Clearance: 52.9 mL/min (A) (by C-G formula based on SCr of 1.67 mg/dL (H)). Liver Function Tests: No results for input(s): AST, ALT, ALKPHOS, BILITOT, PROT, ALBUMIN in the last 168 hours. No results for input(s): LIPASE, AMYLASE in the last 168 hours. No results for input(s): AMMONIA in the last 168 hours. Coagulation profile No results for input(s): INR, PROTIME in the last 168 hours. COVID-19 Labs  No results for  input(s): DDIMER, FERRITIN, LDH, CRP in the last 72 hours.  Lab Results  Component Value Date   [place] NEGATIVE 07/22/2020    CBC: Recent Labs  Lab 07/31/20 2221 08/01/20 0039 08/03/20 0232 08/04/20 0301 08/05/20 0342  WBC 24.4* 20.6* 22.9* 18.5* 16.7*  HGB 10.7* 10.8* 10.4* 9.8* 8.8*  HCT 34.8* 33.1* 32.2* 30.3* 28.8*  MCV 105.8* 103.4* 104.2* 104.5* 105.5*  PLT 310 315 366 355 348   Cardiac Enzymes: No results for input(s): CKTOTAL, CKMB, CKMBINDEX, TROPONINI in the last 168 hours. BNP (last 3 results) No results for input(s): PROBNP in the last 8760 hours. CBG: Recent Labs  Lab 08/06/20 1633 08/06/20 2033 08/07/20 0037 08/07/20 0423 08/07/20 0806  GLUCAP 261* 236* 303* 258* 292*   D-Dimer: No results for input(s): DDIMER in the last 72 hours. Hgb A1c: No results for input(s): HGBA1C in the last 72 hours. Lipid Profile: No results for input(s): CHOL, HDL, LDLCALC, TRIG, CHOLHDL, LDLDIRECT in the last 72 hours. Thyroid function studies: No results for input(s): TSH, T4TOTAL, T3FREE, THYROIDAB in the last 72 hours.  Invalid input(s): FREET3 Anemia work up: No results for input(s): VITAMINB12, FOLATE, FERRITIN, TIBC, IRON, RETICCTPCT in the last 72 hours. Sepsis Labs: Recent Labs  Lab 08/01/20 0039 08/03/20 0232 08/04/20 0301 08/05/20 0342  WBC 20.6* 22.9* 18.5* 16.7*   Microbiology No results found for this or any previous visit (from the past 240 hour(s)).   Medications:   . aspirin  81 mg Per Tube Daily  . atorvastatin  80 mg Per Tube Daily  . bethanechol  10 mg Per Tube TID  . carvedilol  25 mg Per Tube BID WC  . chlorhexidine gluconate (MEDLINE KIT)  15 mL Mouth Rinse BID  . Chlorhexidine Gluconate Cloth  6 each Topical Daily  . feeding supplement (PROSource TF)  45 mL Per Tube BID  . free water  200 mL Per Tube Q3H  . heparin  5,000 Units Subcutaneous Q8H  . hydrALAZINE  50 mg Per Tube Q8H  . insulin aspart  0-20 Units Subcutaneous Q4H   . isosorbide dinitrate  30 mg Per Tube TID  . mouth rinse  15 mL Mouth Rinse BID  . polyvinyl alcohol  1 drop Both Eyes QID  . sodium chloride flush  3 mL Intravenous Q12H  . ticagrelor  90 mg Per Tube BID   Continuous Infusions: . sodium chloride Stopped (07/22/20 0955)  . feeding supplement (OSMOLITE 1.5 CAL) 1,000 mL (08/04/20 1702)      LOS: 16 days   Antonio Navarro  [facility]  08/07/2020, 10:32 AM

## 2020-08-07 NOTE — Plan of Care (Signed)
  Problem: Health Behavior/Discharge Planning: Goal: Ability to manage health-related needs will improve Outcome: Progressing   Problem: Clinical Measurements: Goal: Ability to maintain clinical measurements within normal limits will improve Outcome: Progressing   Problem: Pain Managment: Goal: General experience of comfort will improve Outcome: Progressing   Problem: Skin Integrity: Goal: Risk for impaired skin integrity will decrease Outcome: Progressing   Problem: Safety: Goal: Ability to remain free from injury will improve Outcome: Progressing

## 2020-08-08 LAB — GLUCOSE, CAPILLARY
Glucose-Capillary: 255 mg/dL — ABNORMAL HIGH (ref 70–99)
Glucose-Capillary: 271 mg/dL — ABNORMAL HIGH (ref 70–99)
Glucose-Capillary: 292 mg/dL — ABNORMAL HIGH (ref 70–99)
Glucose-Capillary: 293 mg/dL — ABNORMAL HIGH (ref 70–99)
Glucose-Capillary: 295 mg/dL — ABNORMAL HIGH (ref 70–99)
Glucose-Capillary: 326 mg/dL — ABNORMAL HIGH (ref 70–99)
Glucose-Capillary: 335 mg/dL — ABNORMAL HIGH (ref 70–99)

## 2020-08-08 MED ORDER — JEVITY 1.5 CAL/FIBER PO LIQD
1000.0000 mL | ORAL | Status: DC
  Administered 2020-08-08 – 2020-08-31 (×15): 1000 mL
  Filled 2020-08-08 (×44): qty 1000

## 2020-08-08 NOTE — Plan of Care (Signed)
  Problem: Safety: Goal: Ability to remain free from injury will improve Outcome: Progressing   Problem: Activity: Goal: Capacity to carry out activities will improve Outcome: Progressing   

## 2020-08-08 NOTE — Progress Notes (Signed)
Nutrition Follow-up  DOCUMENTATION CODES:   Not applicable  INTERVENTION:   -D/c Osmolite 1.5   -Initiate Jevity 1.5@ 42m/hr via cortrak tube  416mProsource TF BID   200 ml free water flush every 4 hours  Tube feeding regimen provides2420kcal (100% of needs),122grams of protein, and 118641mf H2O. Total free water: 2386 ml/ daily  NUTRITION DIAGNOSIS:   Inadequate oral intake related to inability to eat as evidenced by NPO status.  Ongoing  GOAL:   Patient will meet greater than or equal to 90% of their needs  Met with TF  MONITOR:   Diet advancement,Labs,Weight trends,TF tolerance,Skin,I & O's  REASON FOR ASSESSMENT:   Consult,Ventilator Enteral/tube feeding initiation and management  ASSESSMENT:   Patient with PMH significant for HTN and kidney stones. Presents this admission s/p cardiac arrest.  12/13- NGT d/c 12/15- cortrak placed, tip of tube in the stomach 12/17- s/p BSE- recommend continue NPO 12/19- pt pulled cortrak tube, replaced- placement verified by x-ray (stomach) 12/20- s/p BSE- pt refusing PO trials  Reviewed I/O's: -216 ml x 24 hours and -1.8 L since 07/25/20  UOP: 2.1 L x 24 hours  Pt remains NPO and dependent on TF via cortrak tube. Osmolite 1.5 infusing via cortrak tube at 65 ml/hr. Pt also receiving 55m63mosource TF BID and 200 ml free water flush every 4 hours. Tube feeding regimen provides2420kcal (100% of needs),120grams of protein, and 1189ml74mH2O. Total free water: 2389 ml/ daily.   Palliative care following for goals of care discussions.   Noted pt with ongoing hyperglycemia. Per DM coordinator notes, recommending TF coverage (novolog 4 units every 4 hours), however, not currently ordered. RD will modify TF formula to formula with fiber to hopefully improve glycemic control.   Labs reviewed: CBGS: [phone] orders for glycemic control are 0-20 units insulin aspart every 4 hours and 15 units insulin  detemir BID).   Diet Order:   Diet Order            Diet NPO time specified  Diet effective now                 EDUCATION NEEDS:   Not appropriate for education at this time  Skin:  Skin Assessment: Skin Integrity Issues: Skin Integrity Issues:: Other (Comment) Other: MASD to buttocks  Last BM:  08/08/20  Height:   Ht Readings from Last 1 Encounters:  07/22/20 6' (1.829 m)    Weight:   Wt Readings from Last 1 Encounters:  08/08/20 89.3 kg    Ideal Body Weight:  80.9 kg  BMI:  Body mass index is 26.7 kg/m.  Estimated Nutritional Needs:   Kcal:  2300-2500 kcal  Protein:  115-130 grams  Fluid:  >/= 2 L/day    Antonio Navarro LDN, CDCESNazarethstered Dietitian II Certified Diabetes Care and Education Specialist Please refer to [facility] and/or RD on-call/weekend/after hours pager

## 2020-08-08 NOTE — Progress Notes (Signed)
[facility] PROGRESS NOTE    Progress Note  Antonio Navarro  BDZ:[id number] DOB: 01-26-1961 DOA: 07/22/2020 PCP: Patient, No Pcp Per     Brief Narrative:   Antonio Navarro is an 59 y.o. male past medical history of essential hypertension chronic back pain who was admitted for cardiac arrest with downtime of 9 minutes out of hospital initial rhythm was V. fib receive shock and 6 rounds of epi before ROSC was achieved he was found to have a STEMI he status post cath with a drug-eluting stent to the circumflex currently on Brilinta and aspirin along with beta-blockers and statins.  Who was left with anoxic brain injury.  He was also in acute renal failure due to V. fib arrest and his creatinine has improved with IV fluid hydration. A 2D echo was done that showed an EF of 40%.on admission.He is currently on a beta-blocker and not on an ACE inhibitor due to renal failure.  Cardiology recommended to continue hydralazine and Isordil .  He Got high risk of aspiration and is currently n.p.o. with a core track.  Palliative care was consulted as his chances of meaningful recovery are nil.  Palliative care is working with the family to try to guide his treatment as they are unsure how to proceed.  He is uninsured so placement will be difficult.  Palliative care is currently working with the family to try to move towards comfort admission due to his poor prognosis.  Family meeting to be scheduled sometime in the middle of the week  Assessment/Plan:   Out of hospital V. fib due to acute ST elevation myocardial infarction (STEMI) ([place]) Post drug-eluting stent to the circumflex currently on aspirin and Brilinta.  Beta-Blockers and statins. Cardiology has signed off recommended to continue current management and to be notified for follow-up visit as an outpatient. Amiodarone was stopped.  Ischemic cardiomyopathy: With an EF of 40% by echo done on this admission. Continue beta-blocker no ACE inhibitor  due to renal disease. Continue hydralazine and isordil.  Acute respiratory failure with hypoxia: Now extubated speech evaluated the patient and deemed him very high risk for aspiration and they recommended n.p.o. Core track was placed. Satting greater 96% on 2 L.  Acute encephalopathy due to anoxic brain injury: Likely due to hypoxia in the setting of out of hospital V. fib arrest due to STEMI Core track in place continue tube feedings. Palliative care has been consulted and is discussing with family.  Acute on chronic kidney disease stage IIIb: Likely prerenal in the setting of V. Fib. With a baseline like around 1.6-2, continue free water per tube. Continue to follow basic metabolic panel intermittently.  Nonsustained SVT: Currently on amiodarone was added yesterday no episodes overnight continue Coreg, further management per cardiology.  Hypernatremia: Continue free water per tube.  Hyperglycemia: With an A1c of 4.7, he is currently on tube feedings we have him on Lantus plus sliding scale.  Goals of care: Legally the son is the main medical decision maker however patient fianc and sisters are working together to determine what is best for the patient. Palliative care was consulted and educated the patient on the medical situation and the significant of anoxic brain injury and compare best case scenario versus were case scenario. Decision regarding artificial feeding and benefits and complications of PEG tube were discussed. Palliative care will continue to work with family they are unsure on how to proceed they have a family meeting and will have further information. Family  will need ongoing conversation and support as they navigate through this difficult decision.  DVT prophylaxis: lovenox Family Communication:Son Status is: Inpatient  Remains inpatient appropriate because:Hemodynamically unstable   Dispo: The patient is from: Home              Anticipated d/c is to:  SNF              Anticipated d/c date is: > 3 days              Patient currently is not medically stable to d/c. Code Status:     Code Status Orders  (From admission, onward)         Start     Ordered   07/22/20 0719  Full code  Continuous        07/22/20 0719        Code Status History    Date Active Date Inactive Code Status Order ID Comments User Context   07/22/2020 0559 07/22/2020 0719 Full Code [id number]  Shellia Cleverly, MD Inpatient   Advance Care Planning Activity        IV Access:    Peripheral IV   Procedures and diagnostic studies:   DG Abd 1 View  Result Date: 08/06/2020 CLINICAL DATA:  Enteric catheter placement EXAM: ABDOMEN - 1 VIEW COMPARISON:  08/06/2020 at 1:13 p.m. FINDINGS: Frontal view of the lower chest and upper abdomen demonstrates stable position of the enteric catheter, tip overlying the gastric antrum. Bowel gas pattern is unremarkable. IMPRESSION: 1. Enteric catheter tip overlying gastric antrum. Electronically Signed   By: Randa Ngo M.D.   On: 08/06/2020 16:20   DG Abd 1 View  Result Date: 08/06/2020 CLINICAL DATA:  Enteric tube placement EXAM: ABDOMEN - 1 VIEW COMPARISON:  Abdominal radiograph from earlier today FINDINGS: The enteric tube tip in the distal stomach. No disproportionately dilated small bowel loops. No evidence of pneumatosis or pneumoperitoneum. IMPRESSION: Enteric tube tip in the distal stomach. Electronically Signed   By: Ilona Sorrel M.D.   On: 08/06/2020 13:42     Medical Consultants:    None.  Anti-Infectives:   none  Subjective:    Antonio Navarro nonverbal.  Objective:    Vitals:   08/08/20 0143 08/08/20 0427 08/08/20 0550 08/08/20 0815  BP: 125/90  (!) 125/98 (!) 143/93  Pulse: (!) 102  97 (!) 105  Resp: 20  20   Temp: 98.9 F (37.2 C)  98.5 F (36.9 C)   TempSrc: Oral  Oral   SpO2:   100%   Weight:  89.3 kg    Height:       SpO2: 100 % O2 Flow Rate (L/min): 4 L/min FiO2 (%): 36  %   Intake/Output Summary (Last 24 hours) at 08/08/2020 0915 Last data filed at 08/08/2020 0749 Gross per 24 hour  Intake 1883.75 ml  Output 1750 ml  Net 133.75 ml   Filed Weights   08/06/20 0346 08/07/20 0044 08/08/20 0427  Weight: 91.5 kg 90.1 kg 89.3 kg    Exam: General exam: In no acute distress. Respiratory system: Good air movement and clear to auscultation. Cardiovascular system: S1 & S2 heard, RRR. No JVD. Gastrointestinal system: Abdomen is nondistended, soft and nontender.  Extremities: No pedal edema. Skin: No rashes, lesions or ulcers  Data Reviewed:    Labs: Basic Metabolic Panel: Recent Labs  Lab 08/02/20 0340 08/03/20 0232 08/04/20 0301 08/05/20 0342 08/06/20 0138  NA 144 146* 146* 141 142  K  4.7 4.6 4.9 4.9 4.9  CL 112* 112* 112* 109 109  CO2 _0 GLUCOSE 155* 127* 246* 237* 277*  BUN 58* 63* 53* 49* 51*  CREATININE 1.73* 1.89* 1.74* 1.61* 1.67*  CALCIUM 9.3 9.0 9.1 8.8* 8.8*  MG 2.6*  --   --   --   --    GFR Estimated Creatinine Clearance: 52.9 mL/min (A) (by C-G formula based on SCr of 1.67 mg/dL (H)). Liver Function Tests: No results for input(s): AST, ALT, ALKPHOS, BILITOT, PROT, ALBUMIN in the last 168 hours. No results for input(s): LIPASE, AMYLASE in the last 168 hours. No results for input(s): AMMONIA in the last 168 hours. Coagulation profile No results for input(s): INR, PROTIME in the last 168 hours. COVID-19 Labs  No results for input(s): DDIMER, FERRITIN, LDH, CRP in the last 72 hours.  Lab Results  Component Value Date   [place] NEGATIVE 07/22/2020    CBC: Recent Labs  Lab 08/03/20 0232 08/04/20 0301 08/05/20 0342  WBC 22.9* 18.5* 16.7*  HGB 10.4* 9.8* 8.8*  HCT 32.2* 30.3* 28.8*  MCV 104.2* 104.5* 105.5*  PLT 366 355 348   Cardiac Enzymes: No results for input(s): CKTOTAL, CKMB, CKMBINDEX, TROPONINI in the last 168 hours. BNP (last 3 results) No results for input(s): PROBNP in the last 8760  hours. CBG: Recent Labs  Lab 08/07/20 1608 08/07/20 1924 08/08/20 0150 08/08/20 0416 08/08/20 0725  GLUCAP 373* 333* 271* 292* 293*   D-Dimer: No results for input(s): DDIMER in the last 72 hours. Hgb A1c: No results for input(s): HGBA1C in the last 72 hours. Lipid Profile: No results for input(s): CHOL, HDL, LDLCALC, TRIG, CHOLHDL, LDLDIRECT in the last 72 hours. Thyroid function studies: No results for input(s): TSH, T4TOTAL, T3FREE, THYROIDAB in the last 72 hours.  Invalid input(s): FREET3 Anemia work up: No results for input(s): VITAMINB12, FOLATE, FERRITIN, TIBC, IRON, RETICCTPCT in the last 72 hours. Sepsis Labs: Recent Labs  Lab 08/03/20 0232 08/04/20 0301 08/05/20 0342  WBC 22.9* 18.5* 16.7*   Microbiology No results found for this or any previous visit (from the past 240 hour(s)).   Medications:   . aspirin  81 mg Per Tube Daily  . atorvastatin  80 mg Per Tube Daily  . bethanechol  10 mg Per Tube TID  . carvedilol  25 mg Per Tube BID WC  . chlorhexidine gluconate (MEDLINE KIT)  15 mL Mouth Rinse BID  . Chlorhexidine Gluconate Cloth  6 each Topical Daily  . feeding supplement (PROSource TF)  45 mL Per Tube BID  . free water  200 mL Per Tube Q3H  . heparin  5,000 Units Subcutaneous Q8H  . hydrALAZINE  50 mg Per Tube Q8H  . insulin aspart  0-20 Units Subcutaneous Q4H  . insulin detemir  15 Units Subcutaneous BID  . isosorbide dinitrate  30 mg Per Tube TID  . mouth rinse  15 mL Mouth Rinse BID  . polyvinyl alcohol  1 drop Both Eyes QID  . sodium chloride flush  3 mL Intravenous Q12H  . ticagrelor  90 mg Per Tube BID   Continuous Infusions: . sodium chloride Stopped (07/22/20 0955)  . feeding supplement (OSMOLITE 1.5 CAL) 1,000 mL (08/07/20 2213)      LOS: 17 days   Charlynne Cousins  [facility]  08/08/2020, 9:15 AM

## 2020-08-09 LAB — GLUCOSE, CAPILLARY
Glucose-Capillary: 272 mg/dL — ABNORMAL HIGH (ref 70–99)
Glucose-Capillary: 325 mg/dL — ABNORMAL HIGH (ref 70–99)
Glucose-Capillary: 300 mg/dL — ABNORMAL HIGH (ref 70–99)
Glucose-Capillary: 295 mg/dL — ABNORMAL HIGH (ref 70–99)
Glucose-Capillary: 314 mg/dL — ABNORMAL HIGH (ref 70–99)
Glucose-Capillary: 285 mg/dL — ABNORMAL HIGH (ref 70–99)

## 2020-08-09 MED ORDER — INSULIN DETEMIR 100 UNIT/ML ~~LOC~~ SOLN
20.0000 [IU] | Freq: Two times a day (BID) | SUBCUTANEOUS | Status: DC
  Administered 2020-08-09: 21:00:00 20 [IU] via SUBCUTANEOUS
  Filled 2020-08-09 (×3): qty 0.2

## 2020-08-09 MED ORDER — INSULIN DETEMIR 100 UNIT/ML ~~LOC~~ SOLN
5.0000 [IU] | Freq: Once | SUBCUTANEOUS | Status: AC
  Administered 2020-08-09: 12:00:00 5 [IU] via SUBCUTANEOUS
  Filled 2020-08-09: qty 0.05

## 2020-08-09 NOTE — Progress Notes (Signed)
PROGRESS NOTE    Antonio Navarro   YIF:[id number]  DOB: 1961-02-22  DOA: 07/22/2020 PCP: Patient, No Pcp Per   Brief Narrative:  Antonio Navarro is an 59 y.o. male past medical history of essential hypertension chronic back pain who was admitted for cardiac arrest with downtime of 9 minutes out of hospital initial rhythm was V. fib receive shock and 6 rounds of epi before ROSC was achieved he was found to have a STEMI he status post cath with a drug-eluting stent to the circumflex currently on Brilinta and aspirin along with beta-blockers and statins.  He has anoxic brain injury from this event and remains unable to verbally communicate, is bed bound with dysphagia and NG tube   Subjective: Per patient's sister at bedside, she feel the family is wanting to pursue a PEG tube now.     Assessment & Plan:   Active Problems:   Acute ST elevation myocardial infarction (STEMI) ([facility])   Cardiac arrest  - received stent to ciricumflex - cont Brillinta, ASA, statin, Coreg, Imdur    Hypertension - cont current meds  Anoxic brain injury - will likely pursue PEG placement- I have left a message for the son, [name] who is making decisions   Time spent in minutes: 35 DVT prophylaxis: heparin injection 5,000 Units Start: 07/22/20 2200 SCDs Start: 07/22/20 0559    Code Status: Full code Family Communication: sister- left message for son Disposition Plan:  Status is: Inpatient  Remains inpatient appropriate because:IV treatments appropriate due to intensity of illness or inability to take PO   Dispo: The patient is from: Home              Anticipated d/c is to: SNF              Anticipated d/c date is: > 3 days              Patient currently is not medically stable to d/c.    Antimicrobials:  Anti-infectives (From admission, onward)   Start     Dose/Rate Route Frequency Ordered Stop   07/22/20 0830  cefTRIAXone (ROCEPHIN) 2 g in sodium chloride 0.9 % 100 mL IVPB        2 g 200  mL/hr over 30 Minutes Intravenous Every 24 hours 07/22/20 0720 07/26/20 0858       Objective: Vitals:   08/08/20 2009 08/09/20 0354 08/09/20 0400 08/09/20 1146  BP: 126/79 (!) 150/79  120/77  Pulse: 100 (!) 101  (!) 102  Resp: '18 20  18  ' Temp: 98.7 F (37.1 C) 99.3 F (37.4 C)  98 F (36.7 C)  TempSrc: Oral Oral  Oral  SpO2: 99% 100%  97%  Weight:   90 kg   Height:        Intake/Output Summary (Last 24 hours) at 08/09/2020 1740 Last data filed at 08/09/2020 1543 Gross per 24 hour  Intake 1840 ml  Output 1950 ml  Net -110 ml   Filed Weights   08/07/20 0044 08/08/20 0427 08/09/20 0400  Weight: 90.1 kg 89.3 kg 90 kg    Examination: General exam: Appears comfortable  HEENT: PERRLA, oral mucosa moist, no sclera icterus or thrush- Cor trac in place Respiratory system: Clear to auscultation. Respiratory effort normal. Cardiovascular system: S1 & S2 heard, RRR.   Gastrointestinal system: Abdomen soft, non-tender, nondistended. Normal bowel sounds. Central nervous system: asleep- awakens briefly- does not follow commands Extremities: No cyanosis, clubbing or edema Skin: No rashes or ulcers  Psychiatry:  Unable to assess    Data Reviewed: I have personally reviewed following labs and imaging studies  CBC: Recent Labs  Lab 08/03/20 0232 08/04/20 0301 08/05/20 0342  WBC 22.9* 18.5* 16.7*  HGB 10.4* 9.8* 8.8*  HCT 32.2* 30.3* 28.8*  MCV 104.2* 104.5* 105.5*  PLT 366 355 737   Basic Metabolic Panel: Recent Labs  Lab 08/03/20 0232 08/04/20 0301 08/05/20 0342 08/06/20 0138  NA 146* 146* 141 142  K 4.6 4.9 4.9 4.9  CL 112* 112* 109 109  CO2 '23 23 24 25  ' GLUCOSE 127* 246* 237* 277*  BUN 63* 53* 49* 51*  CREATININE 1.89* 1.74* 1.61* 1.67*  CALCIUM 9.0 9.1 8.8* 8.8*   GFR: Estimated Creatinine Clearance: 52.9 mL/min (A) (by C-G formula based on SCr of 1.67 mg/dL (H)). Liver Function Tests: No results for input(s): AST, ALT, ALKPHOS, BILITOT, PROT, ALBUMIN in  the last 168 hours. No results for input(s): LIPASE, AMYLASE in the last 168 hours. No results for input(s): AMMONIA in the last 168 hours. Coagulation Profile: No results for input(s): INR, PROTIME in the last 168 hours. Cardiac Enzymes: No results for input(s): CKTOTAL, CKMB, CKMBINDEX, TROPONINI in the last 168 hours. BNP (last 3 results) No results for input(s): PROBNP in the last 8760 hours. HbA1C: No results for input(s): HGBA1C in the last 72 hours. CBG: Recent Labs  Lab 08/09/20 0351 08/09/20 0803 08/09/20 1141 08/09/20 1319 08/09/20 1539  GLUCAP 272* 325* 300* 295* 314*   Lipid Profile: No results for input(s): CHOL, HDL, LDLCALC, TRIG, CHOLHDL, LDLDIRECT in the last 72 hours. Thyroid Function Tests: No results for input(s): TSH, T4TOTAL, FREET4, T3FREE, THYROIDAB in the last 72 hours. Anemia Panel: No results for input(s): VITAMINB12, FOLATE, FERRITIN, TIBC, IRON, RETICCTPCT in the last 72 hours. Urine analysis:    Component Value Date/Time   COLORURINE YELLOW 03/08/2019 1115   APPEARANCEUR CLEAR 03/08/2019 1115   LABSPEC 1.020 03/08/2019 1115   PHURINE 5.0 03/08/2019 1115   GLUCOSEU NEGATIVE 03/08/2019 1115   HGBUR NEGATIVE 03/08/2019 1115   BILIRUBINUR NEGATIVE 03/08/2019 1115   [place] 03/08/2019 1115   PROTEINUR NEGATIVE 03/08/2019 1115   NITRITE NEGATIVE 03/08/2019 1115   LEUKOCYTESUR SMALL (A) 03/08/2019 1115   Sepsis Labs: '@LABRCNTIP' (procalcitonin:4,lacticidven:4) )No results found for this or any previous visit (from the past 240 hour(s)).       Radiology Studies: No results found.    Scheduled Meds: . aspirin  81 mg Per Tube Daily  . atorvastatin  80 mg Per Tube Daily  . bethanechol  10 mg Per Tube TID  . carvedilol  25 mg Per Tube BID WC  . chlorhexidine gluconate (MEDLINE KIT)  15 mL Mouth Rinse BID  . Chlorhexidine Gluconate Cloth  6 each Topical Daily  . feeding supplement (PROSource TF)  45 mL Per Tube BID  . free water   200 mL Per Tube Q3H  . heparin  5,000 Units Subcutaneous Q8H  . hydrALAZINE  50 mg Per Tube Q8H  . insulin aspart  0-20 Units Subcutaneous Q4H  . insulin detemir  20 Units Subcutaneous BID  . isosorbide dinitrate  30 mg Per Tube TID  . mouth rinse  15 mL Mouth Rinse BID  . polyvinyl alcohol  1 drop Both Eyes QID  . sodium chloride flush  3 mL Intravenous Q12H  . ticagrelor  90 mg Per Tube BID   Continuous Infusions: . sodium chloride Stopped (07/22/20 0955)  . feeding supplement (JEVITY 1.5 CAL/FIBER)  1,000 mL (08/08/20 2246)     LOS: 18 days      Debbe Odea, MD [facility] Pager: [email or web address] 08/09/2020, 5:40 PM

## 2020-08-09 NOTE — Progress Notes (Signed)
Results for KENRICK, PORE (MRN [id number]) as of 08/09/2020 09:00  Ref. Range 08/08/2020 16:07 08/08/2020 20:13 08/08/2020 23:47 08/09/2020 03:51 08/09/2020 08:03  Glucose-Capillary Latest Ref Range: 70 - 99 mg/dL 295 (H) 326 (H) 255 (H) 272 (H) 325 (H)  If continuing on insulin, recommend increasing Levemir to 20 units BID since blood sugars have been greater than 250 mg/dl.   Harvel Ricks RN BSN CDE Diabetes Coordinator Pager: [phone]  8am-5pm

## 2020-08-09 NOTE — Progress Notes (Signed)
  Speech Language Pathology Treatment: Dysphagia  Patient Details Name: Antonio Navarro MRN: [id number] DOB: [birth year] Today's Date: 08/09/2020 Time: 3716-9678 SLP Time Calculation (min) (ACUTE ONLY): 31 min  Assessment / Plan / Recommendation Clinical Impression  Pt was seen with his sister Antonio Navarro present for therapy. SLP attempted to provide a variety of textures and flavors (ice chips, thin liquids, purees, soft solids), also using his sister as a therapeutic agent to try to encourage and provide POs. He continues to show active refusal by turning away, shaking his head "no," and/or spitting out anything that crosses over his lips into his anterior oral cavity. No boluses were ultimately accepted despite cues, and no swallows were elicited. Discussed with Antonio Navarro that this has been very consistent in my recent interactions with him regardless of the types of boluses I present. Since I have not been able to see him swallow I cannot necessarily comment on how safe it may or may not be, but I do have concerns about his ability to maintain adequate nutrition/hydration or establish a reliable means of getting medications by a purely PO route even if we do find something that he will swallow. Recommend palliative care conversation regarding overall GOC.   HPI HPI: Pt is a 59 yo male s/p arrest with downtime 9 minutes. Initial rhythm was V. fib, shocked and received 6 rounds of epi before ROSC achieved.  Total CPR time was 30 minutes. ETT 12/4-12/13. MRI 12/7 suggestive of a widespread anoxic injury. PMH includes kidney stones and HTN.      SLP Plan  Continue with current plan of care       Recommendations  Diet recommendations: NPO Medication Administration: Via alternative means                Oral Care Recommendations: Oral care QID Follow up Recommendations:  (tba) SLP Visit Diagnosis: Dysphagia, unspecified (R13.10) Plan: Continue with current plan of care       GO                 Osie Bond., M.A. [place] [facility] Pager [phone] Office [phone]  08/09/2020, 4:31 PM

## 2020-08-09 NOTE — Plan of Care (Signed)
  Problem: Safety: Goal: Ability to remain free from injury will improve 08/09/2020 2354 by [name], RN Outcome: Progressing 08/09/2020 2353 by [name], RN Outcome: Progressing

## 2020-08-09 NOTE — Plan of Care (Signed)
  Problem: Safety: Goal: Ability to remain free from injury will improve 08/09/2020 2358 by [name], RN Outcome: Progressing 08/09/2020 2354 by [name], RN Outcome: Progressing 08/09/2020 2353 by [name], RN Outcome: Progressing

## 2020-08-10 ENCOUNTER — Inpatient Hospital Stay (HOSPITAL_COMMUNITY): Payer: Medicaid Other

## 2020-08-10 LAB — GLUCOSE, CAPILLARY
Glucose-Capillary: 262 mg/dL — ABNORMAL HIGH (ref 70–99)
Glucose-Capillary: 281 mg/dL — ABNORMAL HIGH (ref 70–99)
Glucose-Capillary: 294 mg/dL — ABNORMAL HIGH (ref 70–99)
Glucose-Capillary: 317 mg/dL — ABNORMAL HIGH (ref 70–99)
Glucose-Capillary: 328 mg/dL — ABNORMAL HIGH (ref 70–99)

## 2020-08-10 IMAGING — CT CT ABDOMEN W/O CM
2 of 4 series · 16 of 46 positions shown, 18 images · non-contrast
Comparison: CT abdomen pelvis-[DATE]

CLINICAL DATA: Evaluate gastric anatomy prior to potential
percutaneous gastrostomy tube placement.

EXAM:
CT ABDOMEN WITHOUT CONTRAST
TECHNIQUE: Multidetector CT imaging of the abdomen was performed following the
standard protocol without IV contrast.

[Series 3: a/p w/o 5mm · axial · non-contrast · 0.83mm/px · z∈[+1105,+1405]mm · 13 of 66 slices shown, 15 images]
[im 3/66  soft-tissue]
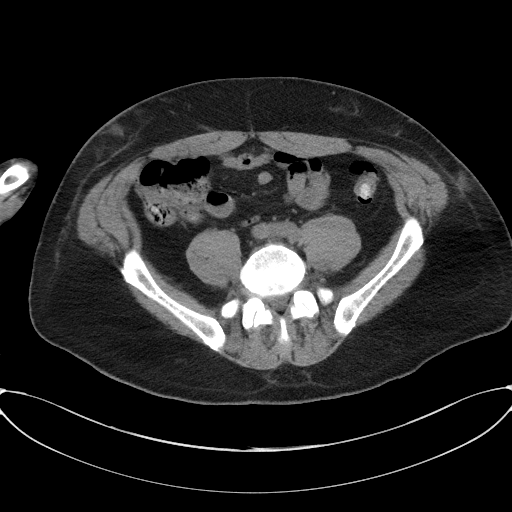
[im 3/66  bone]
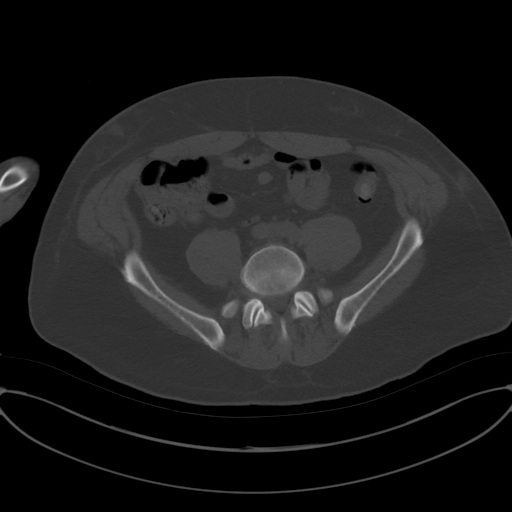
[im 9/66  soft-tissue]
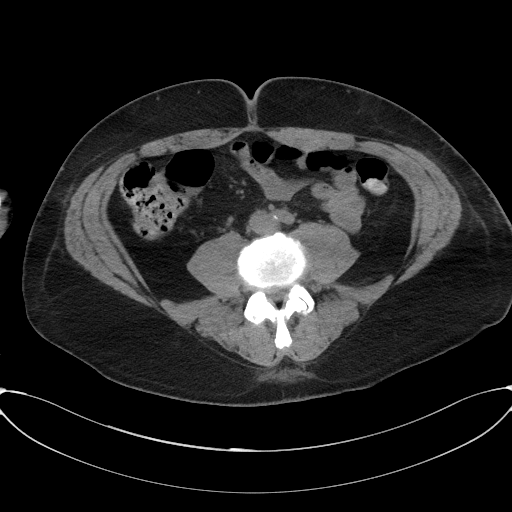
[im 15/66  soft-tissue]
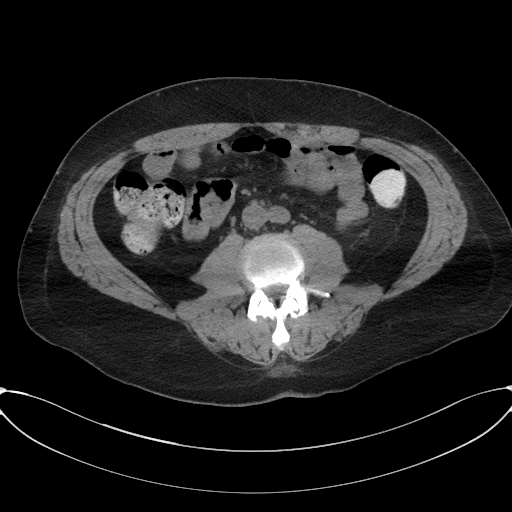
[im 17/66  soft-tissue]
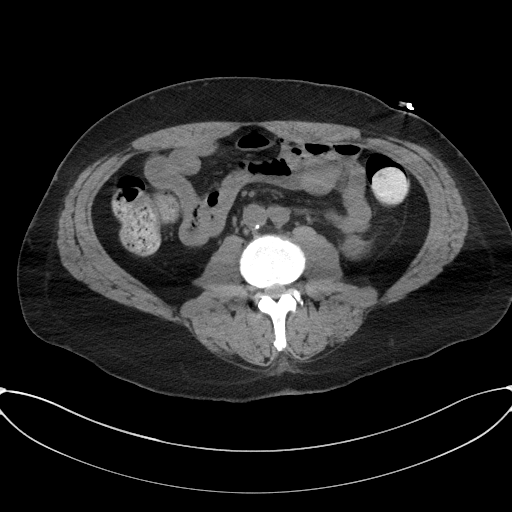
[im 23/66  soft-tissue]
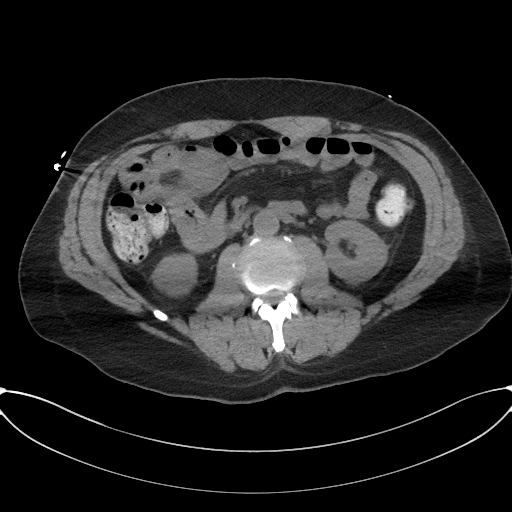
[im 29/66  soft-tissue]
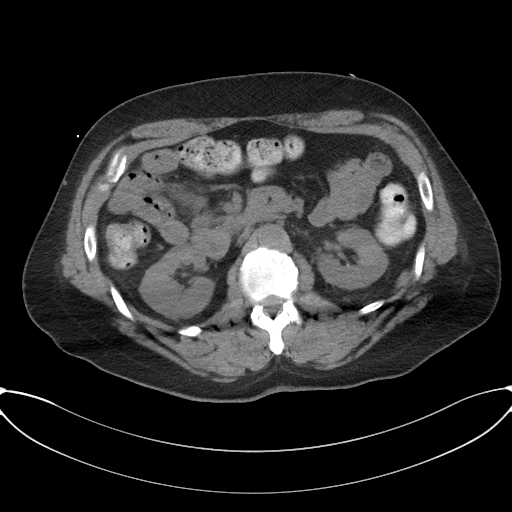
[im 34/66  soft-tissue]
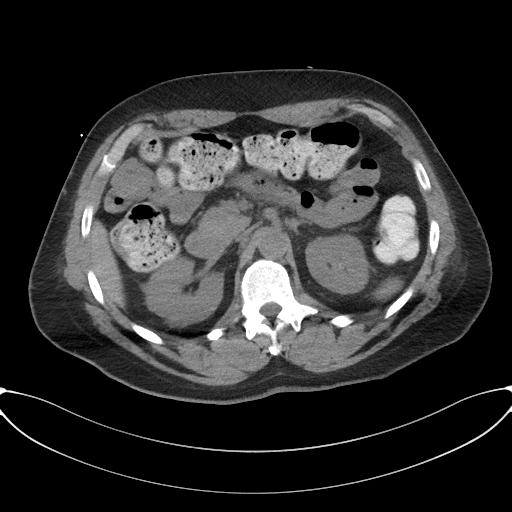
[im 37/66  soft-tissue]
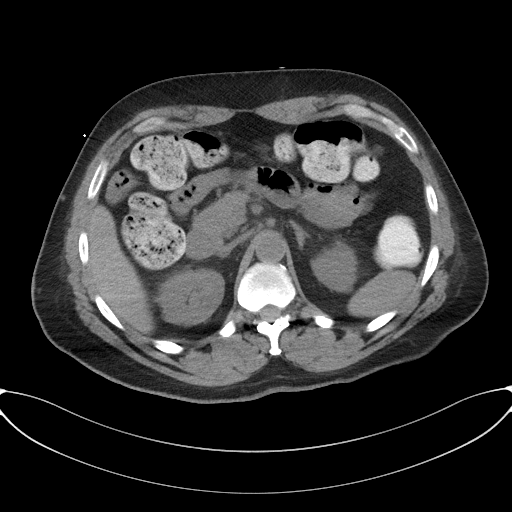
[im 43/66  soft-tissue]
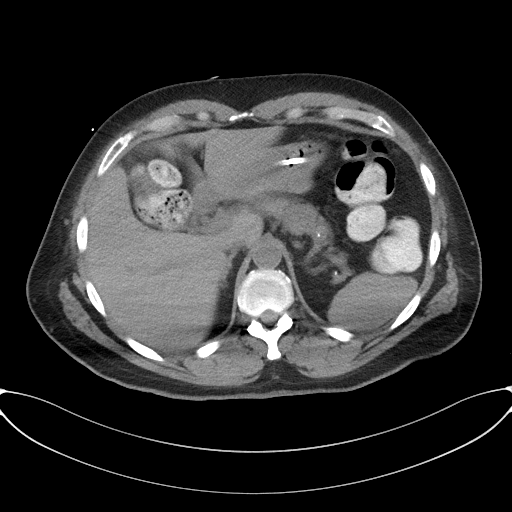
[im 43/66  bone]
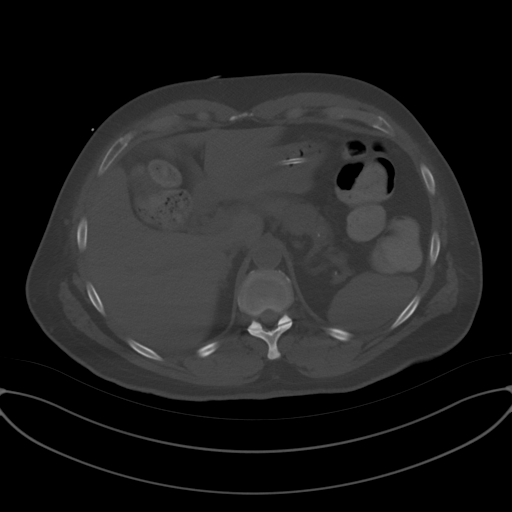
[im 49/66  soft-tissue]
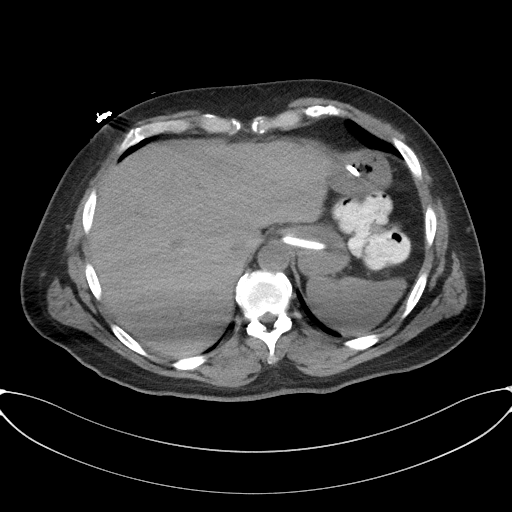
[im 51/66  soft-tissue]
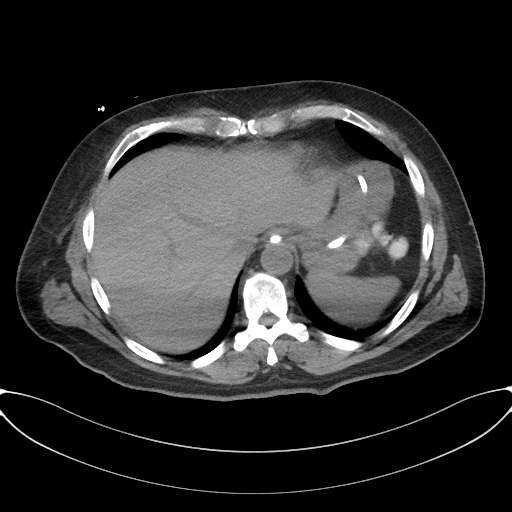
[im 57/66  soft-tissue]
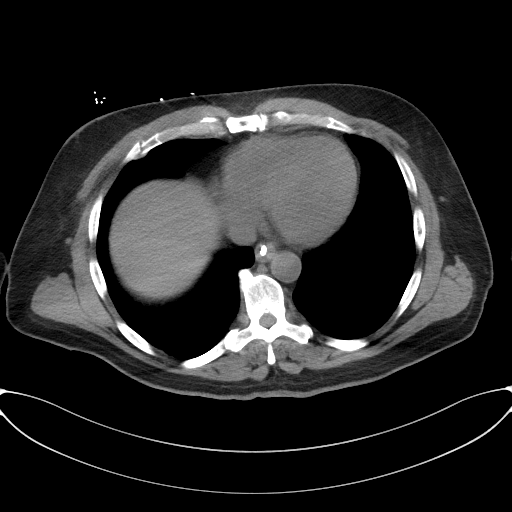
[im 63/66  soft-tissue]
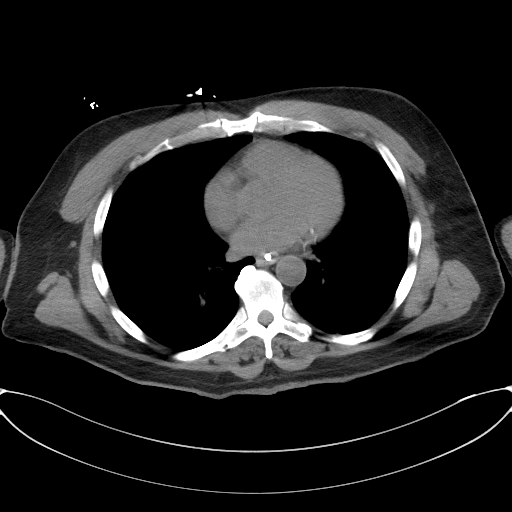

[Series 6: a/p w/o cor · coronal · non-contrast · 0.67mm/px · 3 of 123 slices shown]
[im 41/123  soft-tissue]
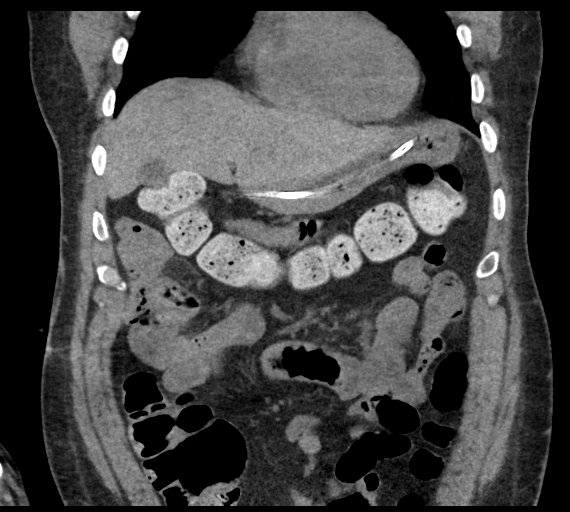
[im 55/123  soft-tissue]
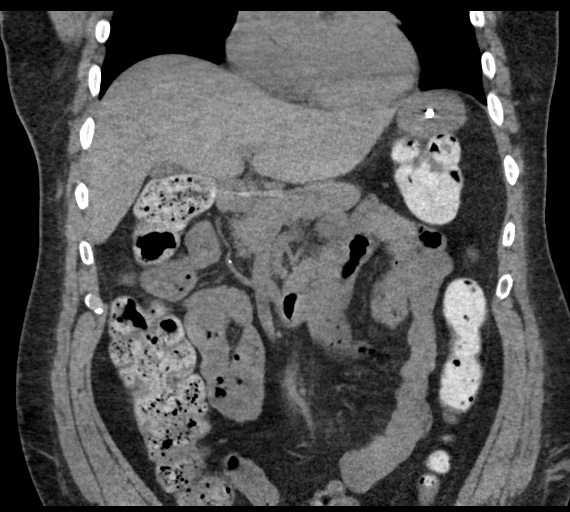
[im 68/123  soft-tissue]
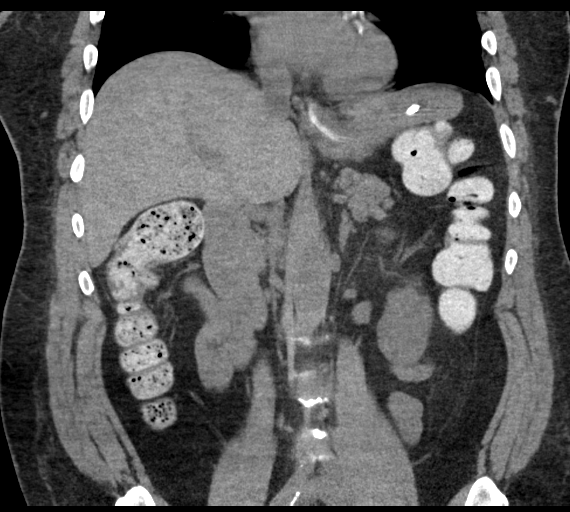

[16 of 46 positions shown; findings below may reference images not displayed]

FINDINGS: The lack of intravenous contrast limits the ability to evaluate
solid abdominal organs.

Lower chest: Limited visualization of the lower thorax demonstrates
minimal subsegmental atelectasis involving the imaged portions of
the bilateral lower lobes. No discrete focal airspace opacities. No
pleural effusion.

Normal heart size. Coronary artery calcifications. No pericardial
effusion.

Hepatobiliary: Normal hepatic contour. Normal noncontrast appearance
of the gallbladder given underdistention. No ascites.

Pancreas: Normal noncontrast appearance of the pancreas.

Spleen: Normal noncontrast appearance of the spleen.

Adrenals/Urinary Tract: Punctate nonobstructing renal stones are
seen bilaterally with dominant nonobstructing right-sided renal
stone measuring 0.4 cm in greatest short axis diameter (coronal
image 89, series 6). There is a minimal amount of likely body
habitus related perinephric stranding. No urinary obstruction.

Normal noncontrast appearance the bilateral adrenal glands. The
urinary bladder was not imaged.

Stomach/Bowel: The anterior wall of the stomach is fairly well
apposed against the ventral wall of the upper abdomen and the
percutaneous window will likely be improved with gastric
insufflation.

Enteric tube tip terminates within the gastric antrum. Moderate
colonic stool burden without evidence of enteric obstruction. No
pneumoperitoneum, pneumatosis or portal venous gas.

Vascular/Lymphatic: Atherosclerotic plaque within a normal caliber
abdominal aorta.

No bulky retroperitoneal or mesenteric lymphadenopathy on this
noncontrast examination.

Other: Nodular foci of subcutaneous edema noted about the abdominal
pannus.

Musculoskeletal: Mixed subacute and acute fractures involving the
anterior aspects of the bilateral 4th - 6th ribs. Mild-to-moderate
multilevel lumbar spine DDD, worse at L4-L5 with disc space height
loss, endplate irregularity and sclerosis. Stigmata of dish within
the lower thoracic spine.
IMPRESSION: 1. Gastric anatomy amenable to attempted percutaneous gastrostomy
tube placement as indicated.
2. Mixed subacute and acute minimally displaced fractures involving
the anterior aspects of the bilateral 4th through 6th ribs.
3. Nonobstructing bilateral nephrolithiasis.
4. Coronary calcifications.  Aortic Atherosclerosis ([Y3]-[Y3]).

## 2020-08-10 MED ORDER — INSULIN DETEMIR 100 UNIT/ML ~~LOC~~ SOLN
25.0000 [IU] | Freq: Two times a day (BID) | SUBCUTANEOUS | Status: DC
  Administered 2020-08-10 (×2): 25 [IU] via SUBCUTANEOUS
  Filled 2020-08-10 (×4): qty 0.25

## 2020-08-10 NOTE — Progress Notes (Addendum)
  Speech Language Pathology Treatment: Dysphagia  Patient Details Name: Antonio Navarro MRN: [id number] DOB: [birth year] Today's Date: 08/10/2020 Time: 3382-5053 SLP Time Calculation (min) (ACUTE ONLY): 10 min  Assessment / Plan / Recommendation Clinical Impression  Pt remains very resistant to any oral stimulation, including oral care and a variety of POs, all different from the previous date to try with a wider range of flavors. SLP did manage to provide thin liquids further back into his oral cavity x1 while siphoning via straw. While he did not spit it back out, he also never swallowed it. Suction was used to clear it. Recommend that he remain NPO with ongoing discussions regarding Antonio Navarro.   HPI HPI: Pt is a 59 yo male s/p arrest with downtime 9 minutes. Initial rhythm was V. fib, shocked and received 6 rounds of epi before ROSC achieved.  Total CPR time was 30 minutes. ETT 12/4-12/13. MRI 12/7 suggestive of a widespread anoxic injury. PMH includes kidney stones and HTN.      SLP Plan  Continue with current plan of care       Recommendations  Diet recommendations: NPO Medication Administration: Via alternative means                Oral Care Recommendations: Oral care QID Follow up Recommendations:  (tba) SLP Visit Diagnosis: Dysphagia, unspecified (R13.10) Plan: Continue with current plan of care       GO                Osie Bond., M.A. [facility] Pager [phone] Office [phone]  08/10/2020, 10:41 AM

## 2020-08-10 NOTE — Progress Notes (Signed)
I was called by Dr. Debbe Odea with a question about discontinuation of antiplatelet therapy.  This patient presented with STEMI complicated by cardiac arrest, ventricular fibrillation requiring defibrillation, CPR, intubation, emergent cardiac catheterization with findings of occluded left circumflex artery followed by successful stenting with drug-eluting stent on July 30, 2020. The patient will require dual antiplatelet therapy with aspirin and Brilinta for 12 months, in the settings of medical necessity aspirin can be held for few days however Brilinta absolutely should not be discontinued in the first 3 months ideally in 12 months.  Please call us with any questions.  Ena Dawley, MD 08/10/2020

## 2020-08-10 NOTE — Progress Notes (Addendum)
PROGRESS NOTE    Antonio Navarro   WLN:[id number]  DOB: January 20, 1961  DOA: 07/22/2020 PCP: Patient, No Pcp Per   Brief Narrative:  Antonio Navarro is an 59 y.o. male past medical history of essential hypertension chronic back pain who was admitted for cardiac arrest with downtime of 9 minutes out of hospital initial rhythm was V. fib receive shock and 6 rounds of epi before ROSC was achieved he was found to have a STEMI he status post cath with a drug-eluting stent to the circumflex currently on Brilinta and aspirin along with beta-blockers and statins.  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 - unable to speak, swallow and has no purposeful movements of his arms or legs which are flaccid on my exam- does not follow commands - multiple palliative care discussions have been had - his son would like to pursue PEG placement- I have consulted IR   Time spent in minutes: 35 DVT prophylaxis: heparin injection 5,000 Units Start: 07/22/20 2200 SCDs Start: 07/22/20 0559    Code Status: Full code Family Communication: son, Antonio Navarro Disposition Plan:  Status is: Inpatient  Remains inpatient appropriate because:IV treatments appropriate due to intensity of illness or inability to take PO   Dispo: The patient is from: Home              Anticipated d/c is to: SNF              Anticipated d/c date is: > 3 days              Patient currently is not medically stable to d/c.    Antimicrobials:  Anti-infectives (From admission, onward)   Start     Dose/Rate Route Frequency Ordered Stop   07/22/20  0830  cefTRIAXone (ROCEPHIN) 2 g in sodium chloride 0.9 % 100 mL IVPB        2 g 200 mL/hr over 30 Minutes Intravenous Every 24 hours 07/22/20 0720 07/26/20 0858       Objective: Vitals:   08/10/20 0335 08/10/20 0534 08/10/20 0834 08/10/20 1216  BP:  139/85 106/82 121/87  Pulse:  96 95 91  Resp:   18 16  Temp:   98.3 F (36.8 C) 98.1 F (36.7 C)  TempSrc:   Oral Oral  SpO2:    99%  Weight: 93.5 kg     Height:        Intake/Output Summary (Last 24 hours) at 08/10/2020 1316 Last data filed at 08/10/2020 1015 Gross per 24 hour  Intake 610 ml  Output 1150 ml  Net -540 ml   Filed Weights   08/08/20 0427 08/09/20 0400 08/10/20 0335  Weight: 89.3 kg 90 kg 93.5 kg    Examination: General exam: Appears comfortable  HEENT: PERRLA, oral mucosa moist, no sclera icterus or thrush- Cor trac in place Respiratory system: Clear to auscultation. Respiratory effort normal. Cardiovascular system: S1 & S2 heard, RRR.   Gastrointestinal system: Abdomen soft, non-tender, nondistended. Normal bowel sounds. Central nervous system: asleep- awakens briefly- does not follow commands Extremities: No cyanosis,  clubbing or edema Skin: No rashes or ulcers Psychiatry:  Unable to assess    Data Reviewed: I have personally reviewed following labs and imaging studies  CBC: Recent Labs  Lab 08/04/20 0301 08/05/20 0342  WBC 18.5* 16.7*  HGB 9.8* 8.8*  HCT 30.3* 28.8*  MCV 104.5* 105.5*  PLT 355 168   Basic Metabolic Panel: Recent Labs  Lab 08/04/20 0301 08/05/20 0342 08/06/20 0138  NA 146* 141 142  K 4.9 4.9 4.9  CL 112* 109 109  CO2 _0 GLUCOSE 246* 237* 277*  BUN 53* 49* 51*  CREATININE 1.74* 1.61* 1.67*  CALCIUM 9.1 8.8* 8.8*   GFR: Estimated Creatinine Clearance: 57.3 mL/min (A) (by C-G formula based on SCr of 1.67 mg/dL (H)). Liver Function Tests: No results for input(s): AST, ALT, ALKPHOS, BILITOT, PROT, ALBUMIN in the last 168 hours. No results for input(s):  LIPASE, AMYLASE in the last 168 hours. No results for input(s): AMMONIA in the last 168 hours. Coagulation Profile: No results for input(s): INR, PROTIME in the last 168 hours. Cardiac Enzymes: No results for input(s): CKTOTAL, CKMB, CKMBINDEX, TROPONINI in the last 168 hours. BNP (last 3 results) No results for input(s): PROBNP in the last 8760 hours. HbA1C: No results for input(s): HGBA1C in the last 72 hours. CBG: Recent Labs  Lab 08/09/20 1539 08/09/20 2015 08/10/20 0041 08/10/20 0537 08/10/20 1213  GLUCAP 314* 285* 262* 294* 281*   Lipid Profile: No results for input(s): CHOL, HDL, LDLCALC, TRIG, CHOLHDL, LDLDIRECT in the last 72 hours. Thyroid Function Tests: No results for input(s): TSH, T4TOTAL, FREET4, T3FREE, THYROIDAB in the last 72 hours. Anemia Panel: No results for input(s): VITAMINB12, FOLATE, FERRITIN, TIBC, IRON, RETICCTPCT in the last 72 hours. Urine analysis:    Component Value Date/Time   COLORURINE YELLOW 03/08/2019 1115   APPEARANCEUR CLEAR 03/08/2019 1115   LABSPEC 1.020 03/08/2019 1115   PHURINE 5.0 03/08/2019 1115   GLUCOSEU NEGATIVE 03/08/2019 1115   HGBUR NEGATIVE 03/08/2019 1115   BILIRUBINUR NEGATIVE 03/08/2019 1115   [place] 03/08/2019 1115   PROTEINUR NEGATIVE 03/08/2019 1115   NITRITE NEGATIVE 03/08/2019 1115   LEUKOCYTESUR SMALL (A) 03/08/2019 1115   Sepsis Labs: _1 (procalcitonin:4,lacticidven:4) )No results found for this or any previous visit (from the past 240 hour(s)).       Radiology Studies: CT ABDOMEN WO CONTRAST  Result Date: 08/10/2020 CLINICAL DATA:  Evaluate gastric anatomy prior to potential percutaneous gastrostomy tube placement. EXAM: CT ABDOMEN WITHOUT CONTRAST TECHNIQUE: Multidetector CT imaging of the abdomen was performed following the standard protocol without IV contrast. COMPARISON:  CT abdomen pelvis-08/02/2016 FINDINGS: The lack of intravenous contrast limits the ability to evaluate solid  abdominal organs. Lower chest: Limited visualization of the lower thorax demonstrates minimal subsegmental atelectasis involving the imaged portions of the bilateral lower lobes. No discrete focal airspace opacities. No pleural effusion. Normal heart size. Coronary artery calcifications. No pericardial effusion. Hepatobiliary: Normal hepatic contour. Normal noncontrast appearance of the gallbladder given underdistention. No ascites. Pancreas: Normal noncontrast appearance of the pancreas. Spleen: Normal noncontrast appearance of the spleen. Adrenals/Urinary Tract: Punctate nonobstructing renal stones are seen bilaterally with dominant nonobstructing right-sided renal stone measuring 0.4 cm in greatest short axis diameter (coronal image 89, series 6). There is a minimal amount of likely body habitus related perinephric stranding. No urinary obstruction. Normal noncontrast appearance the bilateral adrenal glands. The urinary bladder was not imaged. Stomach/Bowel: The anterior wall of the stomach is fairly well apposed against the ventral wall  of the upper abdomen and the percutaneous window will likely be improved with gastric insufflation. Enteric tube tip terminates within the gastric antrum. Moderate colonic stool burden without evidence of enteric obstruction. No pneumoperitoneum, pneumatosis or portal venous gas. Vascular/Lymphatic: Atherosclerotic plaque within a normal caliber abdominal aorta. No bulky retroperitoneal or mesenteric lymphadenopathy on this noncontrast examination. Other: Nodular foci of subcutaneous edema noted about the abdominal pannus. Musculoskeletal: Mixed subacute and acute fractures involving the anterior aspects of the bilateral 4th - 6th ribs. Mild-to-moderate multilevel lumbar spine DDD, worse at L4-L5 with disc space height loss, endplate irregularity and sclerosis. Stigmata of dish within the lower thoracic spine. IMPRESSION: 1. Gastric anatomy amenable to attempted percutaneous  gastrostomy tube placement as indicated. 2. Mixed subacute and acute minimally displaced fractures involving the anterior aspects of the bilateral 4th through 6th ribs. 3. Nonobstructing bilateral nephrolithiasis. 4. Coronary calcifications.  Aortic Atherosclerosis (ICD10-I70.0). Electronically Signed   By: Sandi Mariscal M.D.   On: 08/10/2020 11:14      Scheduled Meds: . aspirin  81 mg Per Tube Daily  . atorvastatin  80 mg Per Tube Daily  . bethanechol  10 mg Per Tube TID  . carvedilol  25 mg Per Tube BID WC  . chlorhexidine gluconate (MEDLINE KIT)  15 mL Mouth Rinse BID  . Chlorhexidine Gluconate Cloth  6 each Topical Daily  . feeding supplement (PROSource TF)  45 mL Per Tube BID  . free water  200 mL Per Tube Q3H  . heparin  5,000 Units Subcutaneous Q8H  . hydrALAZINE  50 mg Per Tube Q8H  . insulin aspart  0-20 Units Subcutaneous Q4H  . insulin detemir  25 Units Subcutaneous BID  . isosorbide dinitrate  30 mg Per Tube TID  . mouth rinse  15 mL Mouth Rinse BID  . polyvinyl alcohol  1 drop Both Eyes QID  . sodium chloride flush  3 mL Intravenous Q12H  . ticagrelor  90 mg Per Tube BID   Continuous Infusions: . sodium chloride Stopped (07/22/20 0955)  . feeding supplement (JEVITY 1.5 CAL/FIBER) 1,000 mL (08/10/20 1134)     LOS: 19 days      Debbe Odea, MD [facility] Pager: [email or web address] 08/10/2020, 1:16 PM

## 2020-08-10 NOTE — Progress Notes (Signed)
Inpatient Diabetes Program Recommendations  AACE/ADA: New Consensus Statement on Inpatient Glycemic Control   Target Ranges:  Prepandial:   less than 140 mg/dL      Peak postprandial:   less than 180 mg/dL (1-2 hours)      Critically ill patients:  140 - 180 mg/dL   Results for Antonio Navarro, Antonio Navarro (MRN [id number]) as of 08/10/2020 12:27  Ref. Range 08/09/2020 08:03 08/09/2020 11:41 08/09/2020 13:19 08/09/2020 15:39 08/09/2020 20:15 08/10/2020 00:41 08/10/2020 05:37 08/10/2020 12:13  Glucose-Capillary Latest Ref Range: 70 - 99 mg/dL 325 (H) 300 (H) 295 (H) 314 (H) 285 (H) 262 (H) 294 (H) 281 (H)   Review of Glycemic Control   Current orders for Inpatient glycemic control: Levemir 25 units BID, Novolog 0-20 units Q4H; Jevity @ 65 ml/hr  Inpatient Diabetes Program Recommendations:    Insulin: Please consider inreasing Levemir to 30 units BID and adding Novolog 6 units Q4H for tube feeding coverage. If tube feeding is stopped or held then Novolog tube feeding coverage should also be stopped or held.  Thanks, Barnie Alderman, RN, MSN, CDE Diabetes Coordinator Inpatient Diabetes Program [phone] (Team Pager from 8am to 5pm)

## 2020-08-10 NOTE — Consult Note (Addendum)
Chief Complaint: Patient was seen in consultation today for percutaneous gastric tube placement at the request of Dr Reggy Eye   Supervising Physician: Markus Daft  Patient Status: [facility] - In-pt  History of Present Illness: Antonio Navarro is a 59 y.o. 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 Hypertension   . Kidney stones     Past Surgical History:  Procedure Laterality Date  . CORONARY/GRAFT ACUTE MI REVASCULARIZATION N/A 07/22/2020   Procedure: Coronary/Graft Acute MI Revascularization;  Surgeon: Lorretta Harp, MD;  Location: [place] [facility];  Service: Cardiovascular;  Laterality: N/A;  . LEFT HEART CATH AND CORONARY ANGIOGRAPHY N/A 07/22/2020   Procedure: LEFT HEART CATH AND CORONARY ANGIOGRAPHY;  Surgeon: Lorretta Harp, MD;  Location: [place] [facility];  Service: Cardiovascular;  Laterality: N/A;    Allergies: Patient has no known allergies.  Medications: Prior to Admission medications   Medication Sig Start Date End Date Taking? Authorizing Provider  amLODipine (NORVASC) 10 MG tablet Take 1 tablet (10 mg total) by mouth daily. 05/15/20  Yes Burky, Lanelle Bal B, NP  cyclobenzaprine (FLEXERIL) 5 MG tablet Take 1-2 tablets (5-10 mg total) by mouth at bedtime. 05/15/20  Yes Burky, Lanelle Bal B, NP  gabapentin (NEURONTIN) 300 MG capsule Take 1 capsule (300 mg total) by mouth 3 (three) times daily. 05/22/20  Yes Jaynee Eagles, PA-C  tiZANidine (ZANAFLEX) 4 MG tablet Take 1 tablet (4 mg total) by mouth every 8 (eight) hours  as needed. 05/22/20  Yes Jaynee Eagles, PA-C  traMADol (ULTRAM) 50 MG tablet Take 1 tablet (50 mg total) by mouth every 6 (six) hours as needed for severe pain. 05/22/20  Yes Jaynee Eagles, PA-C  aspirin-acetaminophen-caffeine (EXCEDRIN MIGRAINE) [phone] MG per tablet Take 1 tablet by mouth every 6 (six) hours as needed (toothache). Patient not taking: Reported on 07/22/2020    [provider]  lidocaine (LIDODERM) 5 % Place 1 patch onto the skin daily. Remove & Discard patch within 12 hours or as directed by MD Patient not taking: Reported on 07/22/2020 06/02/20   Alfredia Client, PA-C  predniSONE (STERAPRED UNI-PAK 21 TAB) 10 MG (21) TBPK tablet Take by mouth daily. Per box instruction Patient not taking: Reported on 07/22/2020 05/15/20   Zigmund Gottron, NP     Family History  Problem Relation Age of Onset  . Cancer Mother   . Hypertension Father     Social History   Socioeconomic History  . Marital status: Single    Spouse name: Not on file  . Number of children: Not on file  . Years of education: Not on file  . Highest education level: Not on file  Occupational History  . Not on file  Tobacco Use  . Smoking status: Current Every Day Smoker    Packs/day: 1.00    Types: Cigarettes  . Smokeless tobacco: Never Used  Vaping Use  . Vaping Use: Never used  Substance and Sexual Activity  . Alcohol use: Yes    Comment: occasional  .  Drug use: Yes    Types: Marijuana    Comment: occ  . Sexual activity: Not on file  Other Topics Concern  . Not on file  Social History Narrative  . Not on file   Social Determinants of Health   Financial Resource Strain: Not on file  Food Insecurity: Not on file  Transportation Needs: Not on file  Physical Activity: Not on file  Stress: Not on file  Social Connections: Not on file    Review of Systems: A 12 point ROS discussed and pertinent positives are indicated in the HPI above.  All other systems are negative.   Vital Signs: BP  121/87   Pulse 91   Temp 98.1 F (36.7 C) (Oral)   Resp 16   Ht 6' (1.829 m)   Wt 206 lb 2.1 oz (93.5 kg)   SpO2 99%   BMI 27.96 kg/m   Physical Exam HENT:     Mouth/Throat:     Mouth: Mucous membranes are moist.  Cardiovascular:     Rate and Rhythm: Normal rate and regular rhythm.     Heart sounds: Normal heart sounds.  Pulmonary:     Effort: Pulmonary effort is normal.     Breath sounds: Normal breath sounds.  Abdominal:     Palpations: Abdomen is soft.  Skin:    General: Skin is warm.  Neurological:     Comments: Non verbal Pleasant Does not follow commands Family at bedside     Imaging: EEG  Result Date: 07/27/2020 Lora Havens, MD     07/27/2020  9:42 AM Patient Name: Antonio Navarro MRN: [id number] Epilepsy Attending: Lora Havens Referring Physician/Provider: Dr Ina Homes Date: 07/27/2020 Duration: 25.36 mins  Patient history: 59yo M s/p cardiac arrest. EEG to evaluate for seizure  Level of alertness: comatose  AEDs during EEG study: Propofol  Technical aspects: This EEG study was done with scalp electrodes positioned according to the 10-20 International system of electrode placement. Electrical activity was acquired at a sampling rate of '500Hz'  and reviewed with a high frequency filter of '70Hz'  and a low frequency filter of '1Hz' . EEG data were recorded continuously and digitally stored.  Description: EEG showed continuous generalized 6-'9Hz'  theta-alpha activity admixed with intermittent generalized 2-'3Hz'  delta slowing.  ABNORMALITY -Continuous slow, generalized  IMPRESSION: This study issuggestive ofseverediffuse encephalopathy, nonspecific etiology but likely related to sedation, anoxic-hypoxic injury.No seizures or epileptiform discharges were seen throughout the recording.  EEG appears to be similar to previous eeg on 07/24/2020.  Priyanka Barbra Sarks  CT ABDOMEN WO CONTRAST  Result Date: 08/10/2020 CLINICAL DATA:  Evaluate gastric anatomy prior to  potential percutaneous gastrostomy tube placement. EXAM: CT ABDOMEN WITHOUT CONTRAST TECHNIQUE: Multidetector CT imaging of the abdomen was performed following the standard protocol without IV contrast. COMPARISON:  CT abdomen pelvis-08/02/2016 FINDINGS: The lack of intravenous contrast limits the ability to evaluate solid abdominal organs. Lower chest: Limited visualization of the lower thorax demonstrates minimal subsegmental atelectasis involving the imaged portions of the bilateral lower lobes. No discrete focal airspace opacities. No pleural effusion. Normal heart size. Coronary artery calcifications. No pericardial effusion. Hepatobiliary: Normal hepatic contour. Normal noncontrast appearance of the gallbladder given underdistention. No ascites. Pancreas: Normal noncontrast appearance of the pancreas. Spleen: Normal noncontrast appearance of the spleen. Adrenals/Urinary Tract: Punctate nonobstructing renal stones are seen bilaterally with dominant nonobstructing right-sided renal stone measuring 0.4 cm in greatest short axis diameter (coronal image 89, series 6). There is a minimal amount of likely  body habitus related perinephric stranding. No urinary obstruction. Normal noncontrast appearance the bilateral adrenal glands. The urinary bladder was not imaged. Stomach/Bowel: The anterior wall of the stomach is fairly well apposed against the ventral wall of the upper abdomen and the percutaneous window will likely be improved with gastric insufflation. Enteric tube tip terminates within the gastric antrum. Moderate colonic stool burden without evidence of enteric obstruction. No pneumoperitoneum, pneumatosis or portal venous gas. Vascular/Lymphatic: Atherosclerotic plaque within a normal caliber abdominal aorta. No bulky retroperitoneal or mesenteric lymphadenopathy on this noncontrast examination. Other: Nodular foci of subcutaneous edema noted about the abdominal pannus. Musculoskeletal: Mixed subacute and  acute fractures involving the anterior aspects of the bilateral 4th - 6th ribs. Mild-to-moderate multilevel lumbar spine DDD, worse at L4-L5 with disc space height loss, endplate irregularity and sclerosis. Stigmata of dish within the lower thoracic spine. IMPRESSION: 1. Gastric anatomy amenable to attempted percutaneous gastrostomy tube placement as indicated. 2. Mixed subacute and acute minimally displaced fractures involving the anterior aspects of the bilateral 4th through 6th ribs. 3. Nonobstructing bilateral nephrolithiasis. 4. Coronary calcifications.  Aortic Atherosclerosis (ICD10-I70.0). Electronically Signed   By: Sandi Mariscal M.D.   On: 08/10/2020 11:14   DG Abd 1 View  Result Date: 08/06/2020 CLINICAL DATA:  Enteric catheter placement EXAM: ABDOMEN - 1 VIEW COMPARISON:  08/06/2020 at 1:13 p.m. FINDINGS: Frontal view of the lower chest and upper abdomen demonstrates stable position of the enteric catheter, tip overlying the gastric antrum. Bowel gas pattern is unremarkable. IMPRESSION: 1. Enteric catheter tip overlying gastric antrum. Electronically Signed   By: Randa Ngo M.D.   On: 08/06/2020 16:20   DG Abd 1 View  Result Date: 08/06/2020 CLINICAL DATA:  Enteric tube placement EXAM: ABDOMEN - 1 VIEW COMPARISON:  Abdominal radiograph from earlier today FINDINGS: The enteric tube tip in the distal stomach. No disproportionately dilated small bowel loops. No evidence of pneumatosis or pneumoperitoneum. IMPRESSION: Enteric tube tip in the distal stomach. Electronically Signed   By: Ilona Sorrel M.D.   On: 08/06/2020 13:42   DG Abd 1 View  Result Date: 07/31/2020 CLINICAL DATA:  NG tube placement EXAM: ABDOMEN - 1 VIEW COMPARISON:  None. FINDINGS: Esophageal tube is mildly looped within the region of gastric fundus. Upper gas pattern is unremarkable IMPRESSION: Esophageal tube tip overlies the gastric fundus. Electronically Signed   By: Donavan Foil M.D.   On: 07/31/2020 17:34   CT HEAD  WO CONTRAST  Result Date: 07/22/2020 CLINICAL DATA:  Acute neuro deficit.  Cardiac arrest. EXAM: CT HEAD WITHOUT CONTRAST TECHNIQUE: Contiguous axial images were obtained from the base of the skull through the vertex without intravenous contrast. COMPARISON:  None. FINDINGS: Brain: No evidence of acute infarction, hemorrhage, hydrocephalus, extra-axial collection or mass lesion/mass effect. Vascular: Vascular enhancement due to coronary angiography earlier today. Skull: Negative Sinuses/Orbits: Mucosal edema in the paranasal sinuses with air-fluid level in the maxillary sinus bilaterally. Negative orbit Other: None IMPRESSION: Negative CT head. Note patient had intravenous contrast earlier today for coronary angiography Mucosal edema throughout the paranasal sinuses with air-fluid levels in the maxillary sinus bilaterally. Electronically Signed   By: Franchot Gallo M.D.   On: 07/22/2020 09:39   Antonio BRAIN WO CONTRAST  Addendum Date: 07/25/2020   ADDENDUM REPORT: 07/25/2020 19:21 ADDENDUM: Study discussed by telephone with Critical Care Dr. Ina Homes on 07/25/2020 at 1902 hours. I advised that I feel Neurology consultation would be valuable. Electronically Signed   By: Herminio Heads.D.  On: 07/25/2020 19:21   Result Date: 07/25/2020 CLINICAL DATA:  59 year old male with worsening encephalopathy status post cardiac arrest. EXAM: MRI HEAD WITHOUT CONTRAST TECHNIQUE: Multiplanar, multiecho pulse sequences of the brain and surrounding structures were obtained without intravenous contrast. COMPARISON:  Head CT 07/22/2020. FINDINGS: Brain: Widespread abnormal cortical signal abnormality with a posterior circulation and superior bifrontal predominance. The appearance consists of intense abnormal trace diffusion (series 2, image 25), with isointense ADC, associated T2 and FLAIR hyperintensity. Similar scattered subcortical white matter involvement also in both hemispheres. There is more subtle abnormal signal in the  bilateral thalami (series 6 image 14). The basal ganglia appear within normal limits. The brainstem is spared. Occasional small areas of restricted diffusion in the cerebellum (series 2, image 9). No superimposed acute intracranial hemorrhage, mass effect, or ventriculomegaly. No evidence of mass lesion, extra-axial collection. Cervicomedullary junction and pituitary are within normal limits. Vascular: Major intracranial vascular flow voids are preserved. Skull and upper cervical spine: Negative. Visualized bone marrow signal is within normal limits. Sinuses/Orbits: Negative orbits. Intubated with fluid and mucosal thickening in the bilateral paranasal sinuses and visible pharynx. Other: Mild bilateral mastoid effusions. IMPRESSION: 1. Fairly symmetric abnormal signal is widespread in the hemispheric cortex and subcortical white matter with a posterior circulation predominance. Although posterior reversible encephalopathy syndrome (PRES) can have a similar appearance, the lack of facilitated diffusion in this case favors widespread Anoxic Injury. Bithalamic involvement also suspected. Trace cerebellar involvement. 2. No associated hemorrhage or mass effect. Electronically Signed: By: Genevie Ann M.D. On: 07/25/2020 17:58   CARDIAC CATHETERIZATION  Result Date: 07/22/2020  Prox RCA lesion is 40% stenosed.  Mid Cx lesion is 95% stenosed.  A drug-eluting stent was successfully placed using a Almond [id number].  Post intervention, there is a 0% residual stenosis.  Antonio Navarro is a 59 y.o. male  [id number] LOCATION:  FACILITY: [facility] PHYSICIAN: Quay Burow, M.D. 1961-04-11 DATE OF PROCEDURE:  07/22/2020 DATE OF DISCHARGE: CARDIAC CATHETERIZATION / PCI DES RCA History obtained from chart review. Antonio. Navarro is a 59yo AAM with a hx of HTN and chronic back pain but no other hx.  Hx is obtained by his fiance this evening.  He was in his USOH until yesterday when he developed vague chest discomfort in the  midsternal area but no associated sx of SOB, N/V or diaphoresis.  He took some TUMS.  Later in the evening around 7pm he went to [facility] with his fiance and got a pizza to eat and then felt chest discomfort again.  He watched TV that evening and went to bed around 10:30pm and around 3am his fiance noted what she thought was snoring and she nudged him and he moved and the snoring stopped and she went back to sleep.  About 5 minutes later he made the same noise and she nudged him again and tried to arouse him and he would not respond.  She noticed that his eyes were open but he would not respond. She called EMS but could not do CPR because he was too large for her to move off the bed.  EMS arrived about 10 minutes later and he was found to be in Vfib.  He received a total of 6 rounds of epi and defib x 6 and 1 round of Amio with ROSR.  Now intubated and sedated and hemodynamically stable.  He has a hx of tobacco abuse smoking 1/2ppd and fm hx of father having CHF but no fm hx of  SCD.  According to his fiance he had been out working in the yard earlier that week with no problems and was quite active. His fiance says that he has been vaccinated for COVID 19. Dr. Radford Pax saw the patient in the ER and noticed worsening EKG changes. He remained hemodynamically stable, sedated and intubated without purposeful movements. It was elected to bring him to the Cath Lab for urgent angiography and potential intervention.Marland Kitchen PROCEDURE DESCRIPTION: The patient was brought to the second floor [facility] lab in the postabsorptive state. He was not premedicated . His right groin was prepped and shaved in usual sterile fashion. Xylocaine 1% was used for local anesthesia. A 6 French sheath was inserted into the right common femoral  artery using standard Seldinger technique. 5 French right and left echo status and catheters were used for selective coronary angiography and obtain left heart pressures. Isovue dye was used for the  entirety of the case. Retrograde aorta, ventricular and pullback pressures were recorded. Patient received 180 mg of crushed Brilinta down the OG tube. Patient received an additional 7000 units of heparin with an ACT of 279. A total of 100 cc of contrast was administered to the patient. LVEDP was 16. The patient did have a 40% proximal to mid RCA stenosis that looked hypodense but did not look like the culprit lesion. There was TIMI-3 flow down the RCA. It appeared the culprit lesion was the AV groove circumflex after a marginal branch. Using a 6 [place] XB 3-1/2 cm guide catheter along with a 0.14 Prowater guidewire and a 2 mm x 12 mm balloon the lesion was crossed and predilated. Medtronic Onyx 2.25 mm x 12 mm long stent was then carefully positioned and deployed at 12 atm (2.3 mm) resulting reduction of a 95 to 98% AV groove circumflex 0% residual. The marginal branch appeared intact with only mild narrowing of the ostium. The stent appeared to the well sized for the vessel. Postdilatation was not performed.   Antonio. Abraha was witnessed cardiac arrest with defibrillation CPR. Initial rhythm was VF. The ROSC according to the ER doctor was approximate 30 minutes. The culprit lesion appeared to be an AV groove circumflex stenosis which was stented with a 2.25 x 12 mm long resolute Onyx drug-eluting stent. Patient remained hemodynamically stable throughout the procedure. He did receive 180 mg of crushed Brilinta down the OG tube but because of vomiting and suctioning I am not sure how much of that remained in his system and therefore I elected to bolus him with cangrelor and put him on an occasion. We will reattempt to give him crushed ticagrelor down his OG tube once he stops vomiting after which we can stop the Cangrelor drip. The sheath will be removed removed once ACT falls below 170 and pressure held. Heparin will be restarted 6 hours after sheath removal without a bolus. 2D echo will be obtained. PCCM will be  admitting. It is unclear at this point whether the patient will recover neurologically. Quay Burow. MD, [facility] 07/22/2020 6:42 AM   DG CHEST PORT 1 VIEW  Result Date: 07/30/2020 CLINICAL DATA:  Respiratory distress EXAM: PORTABLE CHEST 1 VIEW COMPARISON:  07/28/2020 FINDINGS: Endotracheal tube and NG tube remain in place, unchanged. Improved aeration in the lung bases. Residual linear atelectasis in the medial left lung base. No confluent opacity on the right. No effusions. IMPRESSION: Improving aeration in the lower lobes. Residual left base atelectasis. Electronically Signed   By: Rolm Baptise M.D.  On: 07/30/2020 03:52   DG Chest Port 1 View  Result Date: 07/28/2020 CLINICAL DATA:  CPR.  Cardiac arrest. EXAM: PORTABLE CHEST 1 VIEW COMPARISON:  07/23/2020. FINDINGS: Interim removal of right IJ line. Endotracheal tube and NG tube in stable position. Heart size normal. Interim removal of right IJ line. Heart size normal. Right mid lung and bibasilar atelectasis and infiltrates. Small left pleural effusion cannot be excluded. No pneumothorax. IMPRESSION: 1. Interim removal of right IJ line. Endotracheal tube and NG tube in stable position. Interim removal of right IJ line. 2. Right mid lung and bibasilar atelectasis and infiltrates. Small left pleural effusion cannot be excluded. Electronically Signed   By: Marcello Moores  Register   On: 07/28/2020 07:58   DG Chest Port 1 View  Result Date: 07/23/2020 CLINICAL DATA:  Recent cardiac arrest EXAM: PORTABLE CHEST 1 VIEW COMPARISON:  07/22/2020 FINDINGS: Endotracheal tube, gastric catheter and right jugular central line are again seen and stable. No pneumothorax is noted. Lungs are clear bilaterally. No bony abnormality is noted. IMPRESSION: No acute abnormality noted. Electronically Signed   By: Inez Catalina M.D.   On: 07/23/2020 09:23   DG CHEST PORT 1 VIEW  Result Date: 07/22/2020 CLINICAL DATA:  Check central line placement EXAM: PORTABLE CHEST 1 VIEW  COMPARISON:  07/22/2020 FINDINGS: Cardiac shadow is stable. Endotracheal tube and gastric catheter are again noted and stable. New right jugular central line is noted in the proximal superior vena cava. No pneumothorax is seen. Mild vascular congestion is noted. Previously seen right basilar atelectasis has resolved. New left basilar atelectasis is seen. IMPRESSION: New left basilar atelectasis. No evidence of pneumothorax following right sided central venous line placement. Electronically Signed   By: Inez Catalina M.D.   On: 07/22/2020 12:17   DG Chest Portable 1 View  Result Date: 07/22/2020 CLINICAL DATA:  Intubation EXAM: PORTABLE CHEST 1 VIEW COMPARISON:  None. FINDINGS: Support Apparatus: --Endotracheal tube: Tip at the level of the clavicular heads. --Enteric tube:Tip and sideport project over the stomach. --Catheter(s):None --Other: None Linear opacities at the right lung base. IMPRESSION: 1. Radiographically appropriate position of endotracheal tube and nasogastric tube. 2. Right basilar atelectasis. Electronically Signed   By: Ulyses Jarred M.D.   On: 07/22/2020 04:46   DG Abd Portable 1V  Result Date: 08/06/2020 CLINICAL DATA:  Migration of nasogastric tube. EXAM: PORTABLE ABDOMEN - 1 VIEW COMPARISON:  None. FINDINGS: The previous NG tube appears to been removed. A feeding tube is been placed in the interval with the distal tip in the region of the distal stomach. Recommend advancement before use. IMPRESSION: The distal tip of the feeding tube is in the region of the distal stomach. Recommend advancement before use. Electronically Signed   By: Dorise Bullion III M.D   On: 08/06/2020 11:03   DG Abd Portable 1V  Result Date: 08/01/2020 CLINICAL DATA:  NG tube placement. EXAM: PORTABLE ABDOMEN - 1 VIEW COMPARISON:  07/31/2020 FINDINGS: The enteric tube has been mildly retracted and is no longer partially looped in the stomach. The tip of the tube projects in the expected region of the gastric  fundus with side hole near the GE junction. No dilated loops of bowel are seen in the included portion of the abdomen to suggest obstruction. Mild bibasilar lung opacities have slightly increased. IMPRESSION: 1. Mild interval retraction of the enteric tube with tip likely in the gastric fundus and side hole near the GE junction. 2. Slightly increased bibasilar lung opacities which may reflect  atelectasis. Electronically Signed   By: Logan Bores M.D.   On: 08/01/2020 08:02   EEG adult  Result Date: 07/22/2020 Lora Havens, MD     07/22/2020 12:56 PM Patient Name: Antonio Navarro MRN: [id number] Epilepsy Attending: Lora Havens Referring Physician/Provider: Dr Ina Homes Date: 07/22/2020 Duration: 23.10 mins Patient history: 59yo M s/p cardiac arrest. EEG to evaluate for seizure Level of alertness: comatose AEDs during EEG study: Propofol Technical aspects: This EEG study was done with scalp electrodes positioned according to the 10-20 International system of electrode placement. Electrical activity was acquired at a sampling rate of '500Hz'  and reviewed with a high frequency filter of '70Hz'  and a low frequency filter of '1Hz' . EEG data were recorded continuously and digitally stored. Description: EEG showed continuous generalized background attenuation. EEG is not reactive to noxious stimulation. Hyperventilation and photic stimulation were not performed.   ABNORMALITY - Background attenuation, generalized IMPRESSION: This study is suggestive of profound diffuse encephalopathy, nonspecific etiology but likely related to sedation, anoxic-hypoxic injury. No seizures or epileptiform discharges were seen throughout the recording. Priyanka Barbra Sarks   Overnight EEG with video  Result Date: 07/23/2020 Lora Havens, MD     07/24/2020  1:25 PM Patient Name: Antonio Navarro MRN: [id number] Epilepsy Attending: Lora Havens Referring Physician/Provider: Dr Ina Homes Duration: 07/22/2020 1043 to 12/5/202 1043   Patient history: 59yo M s/p cardiac arrest. EEG to evaluate for seizure  Level of alertness: comatose  AEDs during EEG study: Propofol  Technical aspects: This EEG study was done with scalp electrodes positioned according to the 10-20 International system of electrode placement. Electrical activity was acquired at a sampling rate of '500Hz'  and reviewed with a high frequency filter of '70Hz'  and a low frequency filter of '1Hz' . EEG data were recorded continuously and digitally stored.  Description: EEG initially showed generalized attenuation which gradually evolved to burst attenuation with bursts of generalized 4-'5hz'  theta slowing lasting 1-2 seconds and attenuation for 5-7 seconds. Gradually the periods of attenuation became shorter and eeg appeared discontinuous. Event button was pressed on 07/22/2020 at 1259, 1319, 1321 and 1401 during which patient was noted to be trembling/shivering. Concomitant eeg before, during and after the event didn't show any eeg change to suggest seizure  ABNORMALITY - Burst attenuation, generalized  IMPRESSION: This study is suggestive of profound diffuse encephalopathy, nonspecific etiology but likely related to sedation, anoxic-hypoxic injury. No seizures or epileptiform discharges were seen throughout the recording. Event button was pressed on 07/22/2020 at 1259, 1319, 1321 and 1401 during which patient was noted to be trembling/shivering without concomitant eeg change and were NOT epileptic. EEG appears to be improving compared to routine eeg.  Lora Havens   ECHOCARDIOGRAM COMPLETE  Result Date: 07/23/2020    ECHOCARDIOGRAM REPORT   Patient Name:   Antonio Navarro [facility] Date of Exam: 07/23/2020 Medical Rec #:  [id number]      Height:       72.0 in Accession #:    [id number]     Weight:       215.4 lb Date of Birth:  [birth year]      BSA:          2.199 m Patient Age:    19 years       BP:           110/82 mmHg Patient Gender: M              HR:  71 bpm. Exam Location:   Inpatient Procedure: 2D Echo, Cardiac Doppler, Color Doppler and Intracardiac            Opacification Agent Indications:    STEMI I21.3  History:        Patient has no prior history of Echocardiogram examinations.                 Risk Factors:Hypertension and Current Smoker.  Sonographer:    Vickie Epley RDCS Referring Phys: [phone] TRACI R TURNER  Sonographer Comments: Echo performed with patient supine and on artificial respirator and Technically difficult study due to poor echo windows. IMPRESSIONS  1. Left ventricular ejection fraction, by estimation, is 40 to 45%. The left ventricle has mildly decreased function. Left ventricular endocardial border not optimally defined to evaluate regional wall motion, but regional wall motion abnormalities are present. There is mild left ventricular hypertrophy. Left ventricular diastolic parameters are consistent with Grade I diastolic dysfunction (impaired relaxation).  2. Right ventricular systolic function is mildly reduced. The right ventricular size is normal. Tricuspid regurgitation signal is inadequate for assessing PA pressure.  3. The aortic valve is tricuspid. Aortic valve regurgitation is not visualized. No aortic stenosis is present.  4. The mitral valve is grossly normal. No evidence of mitral valve regurgitation. No evidence of mitral stenosis.  5. The inferior vena cava is normal in size with <50% respiratory variability, suggesting right atrial pressure of 8 mmHg. FINDINGS  Left Ventricle: Left ventricular ejection fraction, by estimation, is 40 to 45%. The left ventricle has mildly decreased function. Left ventricular endocardial border not optimally defined to evaluate regional wall motion. Definity contrast agent was given IV to delineate the left ventricular endocardial borders. The left ventricular internal cavity size was normal in size. There is mild left ventricular hypertrophy. Left ventricular diastolic parameters are consistent with Grade I diastolic  dysfunction (impaired relaxation).  LV Wall Scoring: The posterior wall and basal inferior segment are hypokinetic. Right Ventricle: The right ventricular size is normal. No increase in right ventricular wall thickness. Right ventricular systolic function is mildly reduced. Tricuspid regurgitation signal is inadequate for assessing PA pressure. Left Atrium: Left atrial size was normal in size. Right Atrium: Right atrial size was normal in size. Pericardium: There is no evidence of pericardial effusion. Mitral Valve: The mitral valve is grossly normal. No evidence of mitral valve regurgitation. No evidence of mitral valve stenosis. Tricuspid Valve: The tricuspid valve is grossly normal. Tricuspid valve regurgitation is trivial. No evidence of tricuspid stenosis. Aortic Valve: The aortic valve is tricuspid. Aortic valve regurgitation is not visualized. No aortic stenosis is present. Pulmonic Valve: The pulmonic valve was not well visualized. Pulmonic valve regurgitation is not visualized. Aorta: The aortic root and ascending aorta are structurally normal, with no evidence of dilitation. Venous: The inferior vena cava is normal in size with less than 50% respiratory variability, suggesting right atrial pressure of 8 mmHg. IAS/Shunts: The interatrial septum was not well visualized.  LEFT VENTRICLE PLAX 2D LVIDd:         4.70 cm  Diastology LVIDs:         4.10 cm  LV e' medial:    3.65 cm/s LV PW:         1.26 cm  LV E/e' medial:  10.1 LV IVS:        1.29 cm  LV e' lateral:   4.54 cm/s LVOT diam:     2.40 cm  LV E/e' lateral: 8.1 LV SV:  52 LV SV Index:   23 LVOT Area:     4.52 cm  RIGHT VENTRICLE RV S prime:     9.58 cm/s TAPSE (M-mode): 1.5 cm LEFT ATRIUM             Index       RIGHT ATRIUM           Index LA diam:        2.80 cm 1.27 cm/m  RA Area:     12.20 cm LA Vol (A2C):   28.3 ml 12.87 ml/m RA Volume:   29.80 ml  13.55 ml/m LA Vol (A4C):   24.5 ml 11.14 ml/m LA Biplane Vol: 29.0 ml 13.19 ml/m   AORTIC VALVE LVOT Vmax:   74.20 cm/s LVOT Vmean:  47.800 cm/s LVOT VTI:    0.114 m  AORTA Ao Root diam: 3.40 cm MITRAL VALVE MV Area (PHT): 3.16 cm    SHUNTS MV Decel Time: 240 msec    Systemic VTI:  0.11 m MV E velocity: 37.00 cm/s  Systemic Diam: 2.40 cm MV A velocity: 54.80 cm/s MV E/A ratio:  0.68 Cherlynn Kaiser MD Electronically signed by Cherlynn Kaiser MD Signature Date/Time: 07/23/2020/2:06:22 PM    Final     Labs:  CBC: Recent Labs    08/01/20 0039 08/03/20 0232 08/04/20 0301 08/05/20 0342  WBC 20.6* 22.9* 18.5* 16.7*  HGB 10.8* 10.4* 9.8* 8.8*  HCT 33.1* 32.2* 30.3* 28.8*  PLT 315 366 355 348    COAGS: No results for input(s): INR, APTT in the last 8760 hours.  BMP: Recent Labs    08/03/20 0232 08/04/20 0301 08/05/20 0342 08/06/20 0138  NA 146* 146* 141 142  K 4.6 4.9 4.9 4.9  CL 112* 112* 109 109  CO2 '23 23 24 25  ' GLUCOSE 127* 246* 237* 277*  BUN 63* 53* 49* 51*  CALCIUM 9.0 9.1 8.8* 8.8*  CREATININE 1.89* 1.74* 1.61* 1.67*  GFRNONAA 41* 45* 49* 47*    LIVER FUNCTION TESTS: Recent Labs    07/24/20 0425 07/25/20 0343 07/26/20 0526 07/27/20 0318  BILITOT 1.3* 1.4* 1.4* 1.7*  AST 93* 71* 66* 73*  ALT 64* 47* 43 52*  ALKPHOS 53 63 105 161*  PROT 5.5* 5.7* 5.9* 6.6  ALBUMIN 2.5* 2.4* 2.3* 2.6*    TUMOR MARKERS: No results for input(s): AFPTM, CEA, CA199, CHROMGRNA in the last 8760 hours.  Assessment and Plan:  Cardiac event 07/22/20; STEMI and new stent placed Anoxic brain injury Dysphagia Deconditioning Need for long term care Scheduled tentatively for percutaneous gastric tube placement in IR At earliest -- will be placed 12/27 Monday -- asking Cardiology now if can come off Brilinta 5 days. Risks and benefits image guided gastrostomy tube placement was discussed with the patient's son [name] via phone including, but not limited to the need for a barium enema during the procedure, bleeding, infection, peritonitis and/or damage to adjacent  structures.  All questions were answered, [name] is agreeable to proceed. Consent signed and in chart.  Thank you for this interesting consult.  I greatly enjoyed meeting Antonio Navarro and look forward to participating in their care.  A copy of this report was sent to the requesting provider on this date.  Electronically Signed: Lavonia Drafts, PA-C 08/10/2020, 1:23 PM   I spent a total of 40 Minutes    in face to face in clinical consultation, greater than 50% of which was counseling/coordinating care for percutaneous gastric tube placement

## 2020-08-11 LAB — GLUCOSE, CAPILLARY
Glucose-Capillary: 183 mg/dL — ABNORMAL HIGH (ref 70–99)
Glucose-Capillary: 202 mg/dL — ABNORMAL HIGH (ref 70–99)
Glucose-Capillary: 213 mg/dL — ABNORMAL HIGH (ref 70–99)
Glucose-Capillary: 223 mg/dL — ABNORMAL HIGH (ref 70–99)
Glucose-Capillary: 269 mg/dL — ABNORMAL HIGH (ref 70–99)
Glucose-Capillary: 273 mg/dL — ABNORMAL HIGH (ref 70–99)
Glucose-Capillary: 317 mg/dL — ABNORMAL HIGH (ref 70–99)

## 2020-08-11 MED ORDER — INSULIN DETEMIR 100 UNIT/ML ~~LOC~~ SOLN
30.0000 [IU] | Freq: Two times a day (BID) | SUBCUTANEOUS | Status: DC
  Administered 2020-08-11 – 2020-08-16 (×12): 30 [IU] via SUBCUTANEOUS
  Filled 2020-08-11 (×15): qty 0.3

## 2020-08-11 NOTE — Progress Notes (Signed)
PROGRESS NOTE    Antonio Navarro   MMN:[id number]  DOB: 21-Nov-1960  DOA: 07/22/2020 PCP: Patient, No Pcp Per   Brief Narrative:  Antonio Navarro is an 59 y.o. male past medical history of essential hypertension chronic back pain who was admitted for cardiac arrest with downtime of 9 minutes out of hospital initial rhythm was V. fib receive shock and 6 rounds of epi before ROSC was achieved he was found to have a STEMI he status post cath with a drug-eluting stent to the circumflex currently on Brilinta and aspirin along with beta-blockers and statins.  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 - unable to speak & swallow - although he moves his extremities at times (per RN), he has no purposeful movements of his arms or legs and does not follow commands - multiple palliative care discussions have been had - his son would like to pursue PEG placement-   IR is planning this for next week   Time spent in minutes: 35 DVT prophylaxis: heparin injection 5,000 Units Start: 07/22/20 2200 SCDs Start: 07/22/20 0559    Code Status: Full code Family Communication: son, [name] Disposition Plan:  Status is: Inpatient  Remains inpatient appropriate because:IV treatments appropriate due to intensity of illness or inability to take PO   Dispo: The patient is from: Home              Anticipated d/c is  to: SNF              Anticipated d/c date is: > 3 days              Patient currently is not medically stable to d/c.    Antimicrobials:  Anti-infectives (From admission, onward)   Start     Dose/Rate Route Frequency Ordered Stop   07/22/20 0830  cefTRIAXone (ROCEPHIN) 2 g in sodium chloride 0.9 % 100 mL IVPB        2 g 200 mL/hr over 30 Minutes Intravenous Every 24 hours 07/22/20 0720 07/26/20 0858       Objective: Vitals:   08/11/20 0420 08/11/20 0500 08/11/20 0737 08/11/20 1129  BP: 116/81  116/83 98/62  Pulse: 93  85 82  Resp: _0 Temp: 98.5 F (36.9 C)  97.7 F (36.5 C) 98.1 F (36.7 C)  TempSrc: Oral  Oral Oral  SpO2: 99%  100% 100%  Weight:  88.9 kg    Height:        Intake/Output Summary (Last 24 hours) at 08/11/2020 1358 Last data filed at 08/11/2020 1019 Gross per 24 hour  Intake --  Output 1950 ml  Net -1950 ml   Filed Weights   08/09/20 0400 08/10/20 0335 08/11/20 0500  Weight: 90 kg 93.5 kg 88.9 kg    Examination: General  exam: Appears comfortable  HEENT: PERRLA, oral mucosa moist, no sclera icterus or thrush- Cor trac in place Respiratory system: Clear to auscultation. Respiratory effort normal. Cardiovascular system: S1 & S2 heard, RRR.   Gastrointestinal system: Abdomen soft, non-tender, nondistended. Normal bowel sounds. Central nervous system: asleep- awakens briefly- does not follow commands Extremities: No cyanosis, clubbing or edema Skin: No rashes or ulcers Psychiatry:  Unable to assess    Data Reviewed: I have personally reviewed following labs and imaging studies  CBC: Recent Labs  Lab 08/05/20 0342  WBC 16.7*  HGB 8.8*  HCT 28.8*  MCV 105.5*  PLT 160   Basic Metabolic Panel: Recent Labs  Lab 08/05/20 0342 08/06/20 0138  NA 141 142  K 4.9 4.9  CL 109 109  CO2 24 25  GLUCOSE 237* 277*  BUN 49* 51*  CREATININE 1.61* 1.67*  CALCIUM 8.8* 8.8*   GFR: Estimated Creatinine Clearance: 52.9 mL/min (A) (by C-G  formula based on SCr of 1.67 mg/dL (H)). Liver Function Tests: No results for input(s): AST, ALT, ALKPHOS, BILITOT, PROT, ALBUMIN in the last 168 hours. No results for input(s): LIPASE, AMYLASE in the last 168 hours. No results for input(s): AMMONIA in the last 168 hours. Coagulation Profile: No results for input(s): INR, PROTIME in the last 168 hours. Cardiac Enzymes: No results for input(s): CKTOTAL, CKMB, CKMBINDEX, TROPONINI in the last 168 hours. BNP (last 3 results) No results for input(s): PROBNP in the last 8760 hours. HbA1C: No results for input(s): HGBA1C in the last 72 hours. CBG: Recent Labs  Lab 08/10/20 2011 08/11/20 0001 08/11/20 0422 08/11/20 0848 08/11/20 1127  GLUCAP 328* 317* 202* 213* 223*   Lipid Profile: No results for input(s): CHOL, HDL, LDLCALC, TRIG, CHOLHDL, LDLDIRECT in the last 72 hours. Thyroid Function Tests: No results for input(s): TSH, T4TOTAL, FREET4, T3FREE, THYROIDAB in the last 72 hours. Anemia Panel: No results for input(s): VITAMINB12, FOLATE, FERRITIN, TIBC, IRON, RETICCTPCT in the last 72 hours. Urine analysis:    Component Value Date/Time   COLORURINE YELLOW 03/08/2019 1115   APPEARANCEUR CLEAR 03/08/2019 1115   LABSPEC 1.020 03/08/2019 1115   PHURINE 5.0 03/08/2019 1115   GLUCOSEU NEGATIVE 03/08/2019 1115   HGBUR NEGATIVE 03/08/2019 1115   BILIRUBINUR NEGATIVE 03/08/2019 1115   [place] 03/08/2019 1115   PROTEINUR NEGATIVE 03/08/2019 1115   NITRITE NEGATIVE 03/08/2019 1115   LEUKOCYTESUR SMALL (A) 03/08/2019 1115   Sepsis Labs: _0 (procalcitonin:4,lacticidven:4) )No results found for this or any previous visit (from the past 240 hour(s)).       Radiology Studies: CT ABDOMEN WO CONTRAST  Result Date: 08/10/2020 CLINICAL DATA:  Evaluate gastric anatomy prior to potential percutaneous gastrostomy tube placement. EXAM: CT ABDOMEN WITHOUT CONTRAST TECHNIQUE: Multidetector CT imaging of the abdomen was  performed following the standard protocol without IV contrast. COMPARISON:  CT abdomen pelvis-08/02/2016 FINDINGS: The lack of intravenous contrast limits the ability to evaluate solid abdominal organs. Lower chest: Limited visualization of the lower thorax demonstrates minimal subsegmental atelectasis involving the imaged portions of the bilateral lower lobes. No discrete focal airspace opacities. No pleural effusion. Normal heart size. Coronary artery calcifications. No pericardial effusion. Hepatobiliary: Normal hepatic contour. Normal noncontrast appearance of the gallbladder given underdistention. No ascites. Pancreas: Normal noncontrast appearance of the pancreas. Spleen: Normal noncontrast appearance of the spleen. Adrenals/Urinary Tract: Punctate nonobstructing renal stones are seen bilaterally with dominant nonobstructing right-sided renal stone measuring 0.4 cm in greatest short axis diameter (coronal image 89, series 6). There is a  minimal amount of likely body habitus related perinephric stranding. No urinary obstruction. Normal noncontrast appearance the bilateral adrenal glands. The urinary bladder was not imaged. Stomach/Bowel: The anterior wall of the stomach is fairly well apposed against the ventral wall of the upper abdomen and the percutaneous window will likely be improved with gastric insufflation. Enteric tube tip terminates within the gastric antrum. Moderate colonic stool burden without evidence of enteric obstruction. No pneumoperitoneum, pneumatosis or portal venous gas. Vascular/Lymphatic: Atherosclerotic plaque within a normal caliber abdominal aorta. No bulky retroperitoneal or mesenteric lymphadenopathy on this noncontrast examination. Other: Nodular foci of subcutaneous edema noted about the abdominal pannus. Musculoskeletal: Mixed subacute and acute fractures involving the anterior aspects of the bilateral 4th - 6th ribs. Mild-to-moderate multilevel lumbar spine DDD, worse at L4-L5  with disc space height loss, endplate irregularity and sclerosis. Stigmata of dish within the lower thoracic spine. IMPRESSION: 1. Gastric anatomy amenable to attempted percutaneous gastrostomy tube placement as indicated. 2. Mixed subacute and acute minimally displaced fractures involving the anterior aspects of the bilateral 4th through 6th ribs. 3. Nonobstructing bilateral nephrolithiasis. 4. Coronary calcifications.  Aortic Atherosclerosis (ICD10-I70.0). Electronically Signed   By: Sandi Mariscal M.D.   On: 08/10/2020 11:14      Scheduled Meds: . aspirin  81 mg Per Tube Daily  . atorvastatin  80 mg Per Tube Daily  . bethanechol  10 mg Per Tube TID  . carvedilol  25 mg Per Tube BID WC  . chlorhexidine gluconate (MEDLINE KIT)  15 mL Mouth Rinse BID  . Chlorhexidine Gluconate Cloth  6 each Topical Daily  . feeding supplement (PROSource TF)  45 mL Per Tube BID  . free water  200 mL Per Tube Q3H  . heparin  5,000 Units Subcutaneous Q8H  . hydrALAZINE  50 mg Per Tube Q8H  . insulin aspart  0-20 Units Subcutaneous Q4H  . insulin detemir  30 Units Subcutaneous BID  . isosorbide dinitrate  30 mg Per Tube TID  . mouth rinse  15 mL Mouth Rinse BID  . polyvinyl alcohol  1 drop Both Eyes QID  . sodium chloride flush  3 mL Intravenous Q12H  . ticagrelor  90 mg Per Tube BID   Continuous Infusions: . sodium chloride Stopped (07/22/20 0955)  . feeding supplement (JEVITY 1.5 CAL/FIBER) 1,000 mL (08/11/20 0453)     LOS: 20 days      Debbe Odea, MD [facility] Pager: [email or web address] 08/11/2020, 1:58 PM

## 2020-08-12 LAB — GLUCOSE, CAPILLARY
Glucose-Capillary: 191 mg/dL — ABNORMAL HIGH (ref 70–99)
Glucose-Capillary: 183 mg/dL — ABNORMAL HIGH (ref 70–99)
Glucose-Capillary: 195 mg/dL — ABNORMAL HIGH (ref 70–99)
Glucose-Capillary: 220 mg/dL — ABNORMAL HIGH (ref 70–99)
Glucose-Capillary: 192 mg/dL — ABNORMAL HIGH (ref 70–99)

## 2020-08-12 NOTE — Progress Notes (Signed)
PROGRESS NOTE    Antonio Navarro   JME:[id number]  DOB: February 26, 1961  DOA: 07/22/2020 PCP: Patient, No Pcp Per   Brief Narrative:  Antonio Navarro is an 59 y.o. male past medical history of essential hypertension chronic back pain who was admitted for cardiac arrest with downtime of 9 minutes out of hospital initial rhythm was V. fib receive shock and 6 rounds of epi before ROSC was achieved he was found to have a STEMI he status post cath with a drug-eluting stent to the circumflex currently on Brilinta and aspirin along with beta-blockers and statins.  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 - unable to speak & swallow - although he moves his extremities at times (per RN), he has no purposeful movements of his arms or legs and does not follow commands - multiple palliative care discussions have been had - his son would like to pursue PEG placement-   IR is planning this for next week   Time spent in minutes: 35 DVT prophylaxis: heparin injection 5,000 Units Start: 07/22/20 2200 SCDs Start: 07/22/20 0559    Code Status: Full code Family Communication: son, [name] Disposition Plan:  Status is: Inpatient  Remains inpatient appropriate because:IV treatments appropriate due to intensity of illness or inability to take PO   Dispo: The patient is from: Home              Anticipated d/c is to: SNF               Anticipated d/c date is: > 3 days              Patient currently is not medically stable to d/c.    Antimicrobials:  Anti-infectives (From admission, onward)   Start     Dose/Rate Route Frequency Ordered Stop   07/22/20 0830  cefTRIAXone (ROCEPHIN) 2 g in sodium chloride 0.9 % 100 mL IVPB        2 g 200 mL/hr over 30 Minutes Intravenous Every 24 hours 07/22/20 0720 07/26/20 0858       Objective: Vitals:   08/12/20 0307 08/12/20 0732 08/12/20 0909 08/12/20 1216  BP: 114/78 110/78 112/75 101/66  Pulse: 86 85 88 78  Resp: '16 18  16  ' Temp: 98.4 F (36.9 C) 98.1 F (36.7 C) 98.4 F (36.9 C) 98.1 F (36.7 C)  TempSrc: Oral Oral Oral Oral  SpO2: 98% 100%  100%  Weight:      Height:        Intake/Output Summary (Last 24 hours) at 08/12/2020 1242 Last data filed at 08/12/2020 1223 Gross per 24 hour  Intake 1000 ml  Output 1375 ml  Net -375 ml   Filed Weights   08/11/20 0500 08/11/20 2356 08/12/20 0001  Weight: 88.9 kg 89.4 kg 89.4 kg    Examination: General  exam: Appears comfortable  HEENT: PERRL - cor trac present Respiratory system: Clear to auscultation. Respiratory effort normal. Cardiovascular system: S1 & S2 heard,  No murmurs  Gastrointestinal system: Abdomen soft, non-tender, nondistended. Normal bowel sounds   Central nervous system: Alert  -  Nonverbal, no purposeful arm or leg movements- not following commands Extremities: No cyanosis, clubbing or edema Skin: No rashes or ulcers      Data Reviewed: I have personally reviewed following labs and imaging studies  CBC: No results for input(s): WBC, NEUTROABS, HGB, HCT, MCV, PLT in the last 168 hours. Basic Metabolic Panel: Recent Labs  Lab 08/06/20 0138  NA 142  K 4.9  CL 109  CO2 25  GLUCOSE 277*  BUN 51*  CREATININE 1.67*  CALCIUM 8.8*   GFR: Estimated Creatinine Clearance: 52.9 mL/min (A) (by C-G formula based on SCr of 1.67 mg/dL (H)). Liver Function Tests: No results for  input(s): AST, ALT, ALKPHOS, BILITOT, PROT, ALBUMIN in the last 168 hours. No results for input(s): LIPASE, AMYLASE in the last 168 hours. No results for input(s): AMMONIA in the last 168 hours. Coagulation Profile: No results for input(s): INR, PROTIME in the last 168 hours. Cardiac Enzymes: No results for input(s): CKTOTAL, CKMB, CKMBINDEX, TROPONINI in the last 168 hours. BNP (last 3 results) No results for input(s): PROBNP in the last 8760 hours. HbA1C: No results for input(s): HGBA1C in the last 72 hours. CBG: Recent Labs  Lab 08/11/20 2007 08/11/20 2332 08/12/20 0309 08/12/20 0739 08/12/20 1108  GLUCAP 269* 273* 191* 183* 195*   Lipid Profile: No results for input(s): CHOL, HDL, LDLCALC, TRIG, CHOLHDL, LDLDIRECT in the last 72 hours. Thyroid Function Tests: No results for input(s): TSH, T4TOTAL, FREET4, T3FREE, THYROIDAB in the last 72 hours. Anemia Panel: No results for input(s): VITAMINB12, FOLATE, FERRITIN, TIBC, IRON, RETICCTPCT in the last 72 hours. Urine analysis:    Component Value Date/Time   COLORURINE YELLOW 03/08/2019 1115   APPEARANCEUR CLEAR 03/08/2019 1115   LABSPEC 1.020 03/08/2019 1115   PHURINE 5.0 03/08/2019 1115   GLUCOSEU NEGATIVE 03/08/2019 1115   HGBUR NEGATIVE 03/08/2019 1115   BILIRUBINUR NEGATIVE 03/08/2019 1115   [place] 03/08/2019 1115   PROTEINUR NEGATIVE 03/08/2019 1115   NITRITE NEGATIVE 03/08/2019 1115   LEUKOCYTESUR SMALL (A) 03/08/2019 1115   Sepsis Labs: '@LABRCNTIP' (procalcitonin:4,lacticidven:4) )No results found for this or any previous visit (from the past 240 hour(s)).       Radiology Studies: No results found.    Scheduled Meds: . aspirin  81 mg Per Tube Daily  . atorvastatin  80 mg Per Tube Daily  . bethanechol  10 mg Per Tube TID  . carvedilol  25 mg Per Tube BID WC  . chlorhexidine gluconate (MEDLINE KIT)  15 mL Mouth Rinse BID  . feeding supplement (PROSource TF)  45 mL Per Tube BID  . free water   200 mL Per Tube Q3H  . heparin  5,000 Units Subcutaneous Q8H  . hydrALAZINE  50 mg Per Tube Q8H  . insulin aspart  0-20 Units Subcutaneous Q4H  . insulin detemir  30 Units Subcutaneous BID  . isosorbide dinitrate  30 mg Per Tube TID  . mouth rinse  15 mL Mouth Rinse BID  . polyvinyl alcohol  1 drop Both Eyes QID  . sodium chloride flush  3 mL Intravenous Q12H  . ticagrelor  90 mg Per Tube BID   Continuous Infusions: . sodium chloride Stopped (07/22/20 0955)  . feeding supplement (JEVITY  1.5 CAL/FIBER) 1,000 mL (08/12/20 1133)     LOS: 21 days      Debbe Odea, MD [facility] Pager: [email or web address] 08/12/2020, 12:42 PM

## 2020-08-13 LAB — GLUCOSE, CAPILLARY
Glucose-Capillary: 117 mg/dL — ABNORMAL HIGH (ref 70–99)
Glucose-Capillary: 166 mg/dL — ABNORMAL HIGH (ref 70–99)
Glucose-Capillary: 203 mg/dL — ABNORMAL HIGH (ref 70–99)
Glucose-Capillary: 206 mg/dL — ABNORMAL HIGH (ref 70–99)
Glucose-Capillary: 207 mg/dL — ABNORMAL HIGH (ref 70–99)
Glucose-Capillary: 208 mg/dL — ABNORMAL HIGH (ref 70–99)

## 2020-08-13 NOTE — Progress Notes (Signed)
PROGRESS NOTE    Antonio Navarro   BXI:[id number]  DOB: 1960/08/28  DOA: 07/22/2020 PCP: Patient, No Pcp Per   Brief Narrative:  Antonio Navarro is an 59 y.o. male past medical history of essential hypertension chronic back pain who was admitted for cardiac arrest with downtime of 9 minutes out of hospital initial rhythm was V. fib receive shock and 6 rounds of epi before ROSC was achieved he was found to have a STEMI he status post cath with a drug-eluting stent to the circumflex currently on Brilinta and aspirin along with beta-blockers and statins.  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 - unable to speak & swallow - although he moves his extremities at times (per RN), he has no purposeful movements of his arms or legs and does not follow commands - multiple palliative care discussions have been had - his son would like to pursue PEG placement-   IR is planning this for next week   Time spent in minutes: 35 DVT prophylaxis: heparin injection 5,000 Units Start: 07/22/20 2200 SCDs Start: 07/22/20 0559    Code Status: Full code Family Communication: son, [name] Disposition Plan:  Status is: Inpatient  Remains inpatient appropriate because:IV treatments appropriate due to intensity of illness or inability to take PO   Dispo: The patient is from: Home              Anticipated d/c is to: SNF               Anticipated d/c date is: > 3 days              Patient currently is not medically stable to d/c.    Antimicrobials:  Anti-infectives (From admission, onward)   Start     Dose/Rate Route Frequency Ordered Stop   07/22/20 0830  cefTRIAXone (ROCEPHIN) 2 g in sodium chloride 0.9 % 100 mL IVPB        2 g 200 mL/hr over 30 Minutes Intravenous Every 24 hours 07/22/20 0720 07/26/20 0858       Objective: Vitals:   08/13/20 0007 08/13/20 0438 08/13/20 0821 08/13/20 1100  BP: 113/72 128/83 (!) 149/81 (!) 112/91  Pulse: 83 82 84 74  Resp: '18 19 18   ' Temp: (!) 97.5 F (36.4 C) 97.7 F (36.5 C) 98.1 F (36.7 C)   TempSrc: Oral Oral Oral   SpO2: 100% 100% 100%   Weight:  90.2 kg    Height:        Intake/Output Summary (Last 24 hours) at 08/13/2020 1238 Last data filed at 08/13/2020 0840 Gross per 24 hour  Intake 1915 ml  Output 1406 ml  Net 509 ml   Filed Weights   08/11/20 2356 08/12/20 0001 08/13/20 0438  Weight: 89.4 kg 89.4 kg 90.2 kg    Examination:  General exam: Appears comfortable  HEENT: PERRL - cor trac present Respiratory system: Clear to auscultation. Respiratory effort normal. Cardiovascular system: S1 & S2 heard,  No murmurs  Gastrointestinal system: Abdomen soft, non-tender, nondistended. Normal bowel sounds   Central nervous system: Alert  -  Nonverbal, no purposeful arm or leg movements- not following commands Extremities: No cyanosis, clubbing or edema Skin: No rashes or ulcers      Data Reviewed: I have personally reviewed following labs and imaging studies  CBC: No results for input(s): WBC, NEUTROABS, HGB, HCT, MCV, PLT in the last 168 hours. Basic Metabolic Panel: No results for input(s): NA, K, CL, CO2, GLUCOSE, BUN, CREATININE, CALCIUM, MG, PHOS in the last 168 hours. GFR: Estimated Creatinine Clearance: 52.9 mL/min (A) (by C-G formula based on SCr of 1.67 mg/dL (H)). Liver Function Tests: No results for input(s): AST, ALT, ALKPHOS,  BILITOT, PROT, ALBUMIN in the last 168 hours. No results for input(s): LIPASE, AMYLASE in the last 168 hours. No results for input(s): AMMONIA in the last 168 hours. Coagulation Profile: No results for input(s): INR, PROTIME in the last 168 hours. Cardiac Enzymes: No results for input(s): CKTOTAL, CKMB, CKMBINDEX, TROPONINI in the last 168 hours. BNP (last 3 results) No results for input(s): PROBNP in the last 8760 hours. HbA1C: No results for input(s): HGBA1C in the last 72 hours. CBG: Recent Labs  Lab 08/12/20 1955 08/13/20 0005 08/13/20 0435 08/13/20 0728 08/13/20 1125  GLUCAP 192* 206* 203* 166* 117*   Lipid Profile: No results for input(s): CHOL, HDL, LDLCALC, TRIG, CHOLHDL, LDLDIRECT in the last 72 hours. Thyroid Function Tests: No results for input(s): TSH, T4TOTAL, FREET4, T3FREE, THYROIDAB in the last 72 hours. Anemia Panel: No results for input(s): VITAMINB12, FOLATE, FERRITIN, TIBC, IRON, RETICCTPCT in the last 72 hours. Urine analysis:    Component Value Date/Time   COLORURINE YELLOW 03/08/2019 1115   APPEARANCEUR CLEAR 03/08/2019 1115   LABSPEC 1.020 03/08/2019 1115   PHURINE 5.0 03/08/2019 1115   GLUCOSEU NEGATIVE 03/08/2019 1115   HGBUR NEGATIVE 03/08/2019 1115   BILIRUBINUR NEGATIVE 03/08/2019 1115   [place] 03/08/2019 1115   PROTEINUR NEGATIVE 03/08/2019 1115   NITRITE NEGATIVE 03/08/2019 1115   LEUKOCYTESUR SMALL (A) 03/08/2019 1115   Sepsis Labs: '@LABRCNTIP' (procalcitonin:4,lacticidven:4) )No results found for this or any previous visit (from the past 240 hour(s)).       Radiology Studies: No results found.    Scheduled Meds:  aspirin  81 mg Per Tube Daily   atorvastatin  80 mg Per Tube Daily   bethanechol  10 mg Per Tube TID   carvedilol  25 mg Per Tube BID WC   chlorhexidine gluconate (MEDLINE KIT)  15 mL Mouth Rinse BID   feeding supplement (PROSource TF)  45 mL Per Tube BID   free water  200 mL Per Tube Q3H    heparin  5,000 Units Subcutaneous Q8H   hydrALAZINE  50 mg Per Tube Q8H   insulin aspart  0-20 Units Subcutaneous Q4H   insulin detemir  30 Units Subcutaneous BID   isosorbide dinitrate  30 mg Per Tube TID   mouth rinse  15 mL Mouth Rinse BID   polyvinyl alcohol  1 drop Both Eyes QID   sodium chloride flush  3 mL Intravenous Q12H   ticagrelor  90 mg Per Tube BID   Continuous Infusions:  sodium chloride Stopped (07/22/20 0955)   feeding supplement (JEVITY 1.5 CAL/FIBER) 1,000 mL (08/13/20 0437)     LOS: 22  days      Debbe Odea, MD [facility] Pager: [email or web address] 08/13/2020, 12:38 PM

## 2020-08-14 LAB — GLUCOSE, CAPILLARY
Glucose-Capillary: 124 mg/dL — ABNORMAL HIGH (ref 70–99)
Glucose-Capillary: 161 mg/dL — ABNORMAL HIGH (ref 70–99)
Glucose-Capillary: 161 mg/dL — ABNORMAL HIGH (ref 70–99)
Glucose-Capillary: 157 mg/dL — ABNORMAL HIGH (ref 70–99)
Glucose-Capillary: 206 mg/dL — ABNORMAL HIGH (ref 70–99)
Glucose-Capillary: 154 mg/dL — ABNORMAL HIGH (ref 70–99)

## 2020-08-14 NOTE — Progress Notes (Signed)
Patient ID: Antonio Navarro, male   DOB: [birth year], 59 y.o.   MRN: [id number]  This NP visited patient at the bedside as a follow up  for palliative medicine needs and emotional support.  I have had multiple conversations with Antonio Navarro family to include his son, fianc and 2 sisters regarding current medical situation.  Ongoing education regarding diagnosis and likely long-term poor prognosis secondary to anoxic brain injury..  All family members verbalize understanding however at this point they are open to all offered and available medical interventions to prolong life and are hopeful for improvement.  Today the patient's sister/Antonio Navarro is at the bedside.  Her questions and concerns were addressed to the best of my ability, emotional support offered.  Discussed with sister  the importance of continued conversation with family and their  medical providers regarding overall plan of care and treatment options,  ensuring decisions are within the context of the patients values and GOCs.  I verbalized my worry for Antonio Navarro that his current medical situation may be his new baseline, and again the importance of readdressing goals of care into the future ensuring Antonio Navarro   comfort and dignity depending on outcomes.   Questions and concerns addressed     Total time spent on the unit was 25 minutes  Greater than 50% of the time was spent in counseling and coordination of care  Wadie Lessen NP  Palliative Medicine Team Team Phone # [phone] Pager [phone]

## 2020-08-14 NOTE — Progress Notes (Addendum)
CSW called and left message with financial navigator requesting call back.   1655: CSW missed call from financial navigator; CSW called back and left message with pt's MRN 

## 2020-08-14 NOTE — Progress Notes (Signed)
  Speech Language Pathology Treatment: Dysphagia  Patient Details Name: Antonio Navarro MRN: [id number] DOB: 11-05-1960 Today's Date: 08/14/2020 Time: 4315-4008 SLP Time Calculation (min) (ACUTE ONLY): 14 min  Assessment / Plan / Recommendation Clinical Impression  Pt was seen with his sister present for therapy. He continues to expectorate all types of POs offered to him. Subtle change noted today includes biting into the ice before he spit it out, but otherwise unchanged. May need to consider reducing frequency if he continues to spit out all POs. His sister wasn't sure of anything specific that he typically likes to eat and drink, but said that she would ask his girlfriend and relay this information back to [place] so that we can potentially try something different. Will continue efforts.   HPI HPI: Pt is a 59 yo male s/p arrest with downtime 9 minutes. Initial rhythm was V. fib, shocked and received 6 rounds of epi before ROSC achieved.  Total CPR time was 30 minutes. ETT 12/4-12/13. MRI 12/7 suggestive of a widespread anoxic injury. PMH includes kidney stones and HTN.      SLP Plan  Continue with current plan of care       Recommendations  Diet recommendations: NPO Medication Administration: Via alternative means                Oral Care Recommendations: Oral care QID Follow up Recommendations:  (tba) SLP Visit Diagnosis: Dysphagia, unspecified (R13.10) Plan: Continue with current plan of care       GO                Osie Bond., M.A. [facility] Pager [phone] Office [phone]  08/14/2020, 4:30 PM

## 2020-08-14 NOTE — Progress Notes (Signed)
Nutrition Follow-up  DOCUMENTATION CODES:   Not applicable  INTERVENTION:   -Continue Jevity 1.5@ 84m/hr via cortrak tube  44mProsource TF BID   200 ml free water flush every 4 hours  Tube feeding regimen provides2420kcal (100% of needs),122grams of protein, and 118618mf H2O. Total free water: 2386 ml/ daily  NUTRITION DIAGNOSIS:   Inadequate oral intake related to inability to eat as evidenced by NPO status.  Ongoing  GOAL:   Patient will meet greater than or equal to 90% of their needs  Met with TF  MONITOR:   Diet advancement,Labs,Weight trends,TF tolerance,Skin,I & O's  REASON FOR ASSESSMENT:   Consult,Ventilator Enteral/tube feeding initiation and management  ASSESSMENT:   Patient with PMH significant for HTN and kidney stones. Presents this admission s/p cardiac arrest.  12/13- NGT d/c 12/15- cortrak placed, tip of tube in the stomach 12/17- s/p BSE- recommend continue NPO 12/19- pt pulled cortrak tube, replaced- placement verified by x-ray (stomach) 12/20- s/p BSE- pt refusing PO trials  Reviewed I/O's: -1 L x 24 hours and -490 ml since 07/31/20  UOP: 2.3L x 24 hours  Per BSE notes, pt continues to refuse PO trials.   Pt family desires PEG placement, howerver, IR unable to perform procedure due to Brilinta. Per MD notes, Brilinta unable to be held at this time.   Pt remains NPO and dependent on TF via cortrak tube. Osmolite 1.5 infusing via cortrak tube at 65 ml/hr. Pt also receiving 81m49mosource TF BIDand200 ml free water flush every 4 hours.Tube feeding regimen provides2420kcal (100% of needs),120grams of protein, and 1189ml20mH2O. Total free water: 2389 ml/ daily.   Labs reviewed: CBGS: 124-208 (inpatient orders for glycemic control are 0-20 units insulin aspart every 4 hours and 30 units insulin detemir BID).   Diet Order:   Diet Order            Diet NPO time specified  Diet effective now                  EDUCATION NEEDS:   Not appropriate for education at this time  Skin:  Skin Assessment: Skin Integrity Issues: Skin Integrity Issues:: Other (Comment) Other: MASD to buttocks  Last BM:  08/14/20  Height:   Ht Readings from Last 1 Encounters:  07/22/20 6' (1.829 m)    Weight:   Wt Readings from Last 1 Encounters:  08/14/20 89.5 kg    Ideal Body Weight:  80.9 kg  BMI:  Body mass index is 26.76 kg/m.  Estimated Nutritional Needs:   Kcal:  2300-2500 kcal  Protein:  115-130 grams  Fluid:  >/= 2 L/day    JenifLoistine Chance LDN, CDCESCorningstered Dietitian II Certified Diabetes Care and Education Specialist Please refer to AMIONSt [facility]lRD and/or RD on-call/weekend/after hours pager

## 2020-08-14 NOTE — Progress Notes (Signed)
PROGRESS NOTE    Antonio POTEMPA   AOZ:[id number]  DOB: 02-May-1961  DOA: 07/22/2020 PCP: Patient, No Pcp Per   Brief Narrative:  Antonio Navarro is an 59 y.o. male past medical history of essential hypertension chronic back pain who was admitted for cardiac arrest with downtime of 9 minutes out of hospital initial rhythm was V. fib receive shock and 6 rounds of epi before ROSC was achieved he was found to have a STEMI he status post cath with a drug-eluting stent to the circumflex currently on Brilinta and aspirin along with beta-blockers and statins.  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 - unable to speak & swallow - although he moves his extremities at times (per RN), he has no purposeful movements of his arms or legs and does not follow commands - multiple palliative care discussions have been had - his son would like to pursue PEG placement-   IR is unable to do this while he is still on Brillinta   Time spent in minutes: 35 DVT prophylaxis: heparin injection 5,000 Units Start: 07/22/20 2200 SCDs Start: 07/22/20 0559    Code Status: Full code Family Communication: son, [name] Disposition Plan:  Status is: Inpatient  Remains inpatient appropriate because:IV treatments appropriate due to intensity of illness or inability to take PO   Dispo: The patient is from: Home               Anticipated d/c is to: SNF              Anticipated d/c date is: > 3 days              Patient currently is not medically stable to d/c.    Antimicrobials:  Anti-infectives (From admission, onward)   Start     Dose/Rate Route Frequency Ordered Stop   07/22/20 0830  cefTRIAXone (ROCEPHIN) 2 g in sodium chloride 0.9 % 100 mL IVPB        2 g 200 mL/hr over 30 Minutes Intravenous Every 24 hours 07/22/20 0720 07/26/20 0858       Objective: Vitals:   08/13/20 2000 08/14/20 0209 08/14/20 0658 08/14/20 0840  BP: 121/84  (!) 147/76 (!) 124/93  Pulse: 81  76 80  Resp: _0 Temp: 98.1 F (36.7 C)  98.6 F (37 C) (!) 97.5 F (36.4 C)  TempSrc: Oral  Oral Oral  SpO2: 100%  100% 100%  Weight:  89.5 kg    Height:        Intake/Output Summary (Last 24 hours) at 08/14/2020 1114 Last data filed at 08/14/2020 0600 Gross per 24 hour  Intake 1275 ml  Output 1926 ml  Net -651 ml   Filed Weights   08/12/20 0001 08/13/20 0438 08/14/20 0209  Weight: 89.4 kg 90.2 kg 89.5  kg    Examination: General exam: Appears comfortable  HEENT: PERRLA, oral mucosa moist, no sclera icterus or thrush Respiratory system: Clear to auscultation. Respiratory effort normal. Cardiovascular system: S1 & S2 heard,  No murmurs  Gastrointestinal system: Abdomen soft, non-tender, nondistended. Normal bowel sounds   Central nervous system: asleep- awakens briefly- no following commands, non verbal- moves extremities but no purposeful movements Extremities: No cyanosis, clubbing or edema Skin: No rashes or ulcers        Data Reviewed: I have personally reviewed following labs and imaging studies  CBC: No results for input(s): WBC, NEUTROABS, HGB, HCT, MCV, PLT in the last 168 hours. Basic Metabolic Panel: No results for input(s): NA, K, CL, CO2, GLUCOSE, BUN, CREATININE, CALCIUM, MG, PHOS in the last 168 hours. GFR: Estimated Creatinine Clearance: 52.9 mL/min (A) (by C-G formula based on SCr of 1.67  mg/dL (H)). Liver Function Tests: No results for input(s): AST, ALT, ALKPHOS, BILITOT, PROT, ALBUMIN in the last 168 hours. No results for input(s): LIPASE, AMYLASE in the last 168 hours. No results for input(s): AMMONIA in the last 168 hours. Coagulation Profile: No results for input(s): INR, PROTIME in the last 168 hours. Cardiac Enzymes: No results for input(s): CKTOTAL, CKMB, CKMBINDEX, TROPONINI in the last 168 hours. BNP (last 3 results) No results for input(s): PROBNP in the last 8760 hours. HbA1C: No results for input(s): HGBA1C in the last 72 hours. CBG: Recent Labs  Lab 08/13/20 1634 08/13/20 2059 08/14/20 0147 08/14/20 0505 08/14/20 0837  GLUCAP 207* 208* 124* 161* 161*   Lipid Profile: No results for input(s): CHOL, HDL, LDLCALC, TRIG, CHOLHDL, LDLDIRECT in the last 72 hours. Thyroid Function Tests: No results for input(s): TSH, T4TOTAL, FREET4, T3FREE, THYROIDAB in the last 72 hours. Anemia Panel: No results for input(s): VITAMINB12, FOLATE, FERRITIN, TIBC, IRON, RETICCTPCT in the last 72 hours. Urine analysis:    Component Value Date/Time   COLORURINE YELLOW 03/08/2019 1115   APPEARANCEUR CLEAR 03/08/2019 1115   LABSPEC 1.020 03/08/2019 1115   PHURINE 5.0 03/08/2019 1115   GLUCOSEU NEGATIVE 03/08/2019 1115   HGBUR NEGATIVE 03/08/2019 1115   BILIRUBINUR NEGATIVE 03/08/2019 1115   [place] 03/08/2019 1115   PROTEINUR NEGATIVE 03/08/2019 1115   NITRITE NEGATIVE 03/08/2019 1115   LEUKOCYTESUR SMALL (A) 03/08/2019 1115   Sepsis Labs: _0 (procalcitonin:4,lacticidven:4) )No results found for this or any previous visit (from the past 240 hour(s)).       Radiology Studies: No results found.    Scheduled Meds: . aspirin  81 mg Per Tube Daily  . atorvastatin  80 mg Per Tube Daily  . bethanechol  10 mg Per Tube TID  . carvedilol  25 mg Per Tube BID WC  . chlorhexidine gluconate (MEDLINE KIT)  15 mL Mouth Rinse BID  . feeding supplement  (PROSource TF)  45 mL Per Tube BID  . free water  200 mL Per Tube Q3H  . heparin  5,000 Units Subcutaneous Q8H  . hydrALAZINE  50 mg Per Tube Q8H  . insulin aspart  0-20 Units Subcutaneous Q4H  . insulin detemir  30 Units Subcutaneous BID  . isosorbide dinitrate  30 mg Per Tube TID  . mouth rinse  15 mL Mouth Rinse BID  . polyvinyl alcohol  1 drop Both Eyes QID  . sodium chloride flush  3 mL Intravenous Q12H  . ticagrelor  90 mg Per Tube BID   Continuous Infusions: . sodium chloride Stopped (07/22/20 0955)  . feeding supplement (JEVITY 1.5  CAL/FIBER) 1,000 mL (08/13/20 0437)     LOS: 23 days      Debbe Odea, MD [facility] Pager: [email or web address] 08/14/2020, 11:14 AM

## 2020-08-14 NOTE — Progress Notes (Signed)
Progress Note  Patient Name: Antonio Navarro Date of Encounter: 08/14/2020  Primary Cardiologist:   Fransico Him, MD   Subjective   Opens eyes.  No acute distress.  Non verbal  Inpatient Medications    Scheduled Meds: . aspirin  81 mg Per Tube Daily  . atorvastatin  80 mg Per Tube Daily  . bethanechol  10 mg Per Tube TID  . carvedilol  25 mg Per Tube BID WC  . chlorhexidine gluconate (MEDLINE KIT)  15 mL Mouth Rinse BID  . feeding supplement (PROSource TF)  45 mL Per Tube BID  . free water  200 mL Per Tube Q3H  . heparin  5,000 Units Subcutaneous Q8H  . hydrALAZINE  50 mg Per Tube Q8H  . insulin aspart  0-20 Units Subcutaneous Q4H  . insulin detemir  30 Units Subcutaneous BID  . isosorbide dinitrate  30 mg Per Tube TID  . mouth rinse  15 mL Mouth Rinse BID  . polyvinyl alcohol  1 drop Both Eyes QID  . sodium chloride flush  3 mL Intravenous Q12H  . ticagrelor  90 mg Per Tube BID   Continuous Infusions: . sodium chloride Stopped (07/22/20 0955)  . feeding supplement (JEVITY 1.5 CAL/FIBER) 1,000 mL (08/13/20 0437)   PRN Meds: acetaminophen, docusate, Gerhardt's butt cream, metoprolol tartrate, polyethylene glycol, sodium chloride flush   Vital Signs    Vitals:   08/14/20 0209 08/14/20 0658 08/14/20 0840 08/14/20 1227  BP:  (!) 147/76 (!) 124/93 110/83  Pulse:  76 80 76  Resp:  '18 18 16  ' Temp:  98.6 F (37 C) (!) 97.5 F (36.4 C) (!) 97.4 F (36.3 C)  TempSrc:  Oral Oral Oral  SpO2:  100% 100% 100%  Weight: 89.5 kg     Height:        Intake/Output Summary (Last 24 hours) at 08/14/2020 1518 Last data filed at 08/14/2020 0600 Gross per 24 hour  Intake 160 ml  Output 1926 ml  Net -1766 ml   Filed Weights   08/12/20 0001 08/13/20 0438 08/14/20 0209  Weight: 89.4 kg 90.2 kg 89.5 kg    ECG    NA - Personally Reviewed  Physical Exam   GEN: No acute distress.   Neck: No  JVD Cardiac: RRR, no murmurs, rubs, or gallops.  Respiratory: Clear  to  auscultation bilaterally. GI: Soft, nontender, non-distended  MS: No  edema; No deformity. Neuro:  Moves extremities Psych: Unable to assess.    Labs    ChemistryNo results for input(s): NA, K, CL, CO2, GLUCOSE, BUN, CREATININE, CALCIUM, PROT, ALBUMIN, AST, ALT, ALKPHOS, BILITOT, GFRNONAA, GFRAA, ANIONGAP in the last 168 hours.   HematologyNo results for input(s): WBC, RBC, HGB, HCT, MCV, MCH, MCHC, RDW, PLT in the last 168 hours.  Cardiac EnzymesNo results for input(s): TROPONINI in the last 168 hours. No results for input(s): TROPIPOC in the last 168 hours.   BNPNo results for input(s): BNP, PROBNP in the last 168 hours.   DDimer No results for input(s): DDIMER in the last 168 hours.   Radiology    No results found.  Cardiac Studies   Cardiac cath  Diagnostic Dominance: Right    Intervention       Patient Profile     59 y.o. male with history of tobacco abuse, obesity, HTN who was admitted 07/22/20 post cardiac arrest with inferolateral STEMI. He was resuscitated in the field by EMS after being found to be in V fib.  Approximately 30 minutes of CPR. He was intubated on arrival. 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 No other obstructive disease. LvEF=40-45% by echo 07/23/20.   Assessment & Plan    CARDIAC ARREST:  Cath and intervention as above.  Prolonged arrest with anoxic brain injury.  He is in need of a PEG.  However, we cannot safely hold the ticagrelor so soon after a DES for acute MI without bridging antiplatelet.  Currently there is a hospital shortage of tirofiban. Therefore we would need to bridge with cangrelor (0.75 mcg/kg/min).  This would be started 48 hours post the last dose of Brilinta.       For questions or updates, please contact [facility] Please consult [email or web address] for contact info under Cardiology/STEMI.   Signed, Minus Breeding, MD  08/14/2020, 3:18 PM

## 2020-08-14 NOTE — Plan of Care (Signed)
  Problem: Pain Managment: Goal: General experience of comfort will improve Outcome: Completed/Met

## 2020-08-14 NOTE — Consult Note (Signed)
IR received request for gastrostomy tube placement. Patient is on Brilinta for a STEMI and had a drug-eluting stent placed. Gastrostomy tube placement in IR requires a 5-day hold of Brilinta. Cardiology has made the recommendation that Brilinta should not be held at this time.   No IR procedure planned and the request will be deleted from Epic. If the patient is able to discontinue Brilinta in the future for the required 5-day hold please re-consult IR. Dr. Butler Denmark notified. A phone call was made to the patient's son and a message requesting a return call was made.  Please call IR with any questions.  Antonio Navarro, [place] [phone] 08/14/2020, 11:49 AM

## 2020-08-15 DIAGNOSIS — I5022 Chronic systolic (congestive) heart failure: Secondary | ICD-10-CM

## 2020-08-15 LAB — GLUCOSE, CAPILLARY
Glucose-Capillary: 133 mg/dL — ABNORMAL HIGH (ref 70–99)
Glucose-Capillary: 162 mg/dL — ABNORMAL HIGH (ref 70–99)
Glucose-Capillary: 178 mg/dL — ABNORMAL HIGH (ref 70–99)
Glucose-Capillary: 179 mg/dL — ABNORMAL HIGH (ref 70–99)
Glucose-Capillary: 183 mg/dL — ABNORMAL HIGH (ref 70–99)
Glucose-Capillary: 184 mg/dL — ABNORMAL HIGH (ref 70–99)

## 2020-08-15 MED ORDER — ISOSORB DINITRATE-HYDRALAZINE 20-37.5 MG PO TABS
1.0000 | ORAL_TABLET | Freq: Three times a day (TID) | ORAL | Status: DC
  Administered 2020-08-15 – 2020-08-23 (×25): 1 via ORAL
  Filled 2020-08-15 (×27): qty 1

## 2020-08-15 MED ORDER — CEFAZOLIN SODIUM-DEXTROSE 2-4 GM/100ML-% IV SOLN
2.0000 g | INTRAVENOUS | Status: AC
  Filled 2020-08-15: qty 100

## 2020-08-15 NOTE — Progress Notes (Signed)
OT Cancellation Note  Patient Details Name: Antonio Navarro MRN: [id number] DOB: August 10, 1961   Cancelled Treatment:    Reason Eval/Treat Not Completed: Fatigue/lethargy limiting ability to participate. Attempted to 2x to see pt, however, too lethargic to follow commands or to assess level of alertness this PM. OT will attempt at a later time to follow up.  Mayra Reel, MSOT, OTR/L  [facility]  [phone]  08/15/2020, 2:26 PM

## 2020-08-15 NOTE — Evaluation (Signed)
Physical Therapy Evaluation Patient Details Name: Antonio Navarro MRN: [id number] DOB: [birth year] Today's Date: 08/15/2020   History of Present Illness  59 y.o. male with history of tobacco abuse, obesity, HTN who was admitted 07/22/20 post cardiac arrest with inferolateral STEMI. He was resuscitated in the field by EMS after being found to be in V fib. Approximately 30 minutes of CPR. ETT 12/4-12/13. MRI 12/7 shows a widespread anoxic injury.   Clinical Impression  Prior to admission, pt lives with his fiance and works in [employer].  On PT evaluation, pt presents with decreased cognition, weakness, and poor sitting balance. Pt received with bowel incontinence, PT provided peri care. Pt requiring two person maximal assist for bed mobility to sit edge of bed. Pt following ~25% of 1 step commands and able to participate in AAROM exercises of BUE/BLE's. Presents as a Rancho Level IV. Given PLOF, age, and family support, recommending intensive rehab services to maximize functional mobility and decrease caregiver burden.    Follow Up Recommendations CIR    Equipment Recommendations  Wheelchair (measurements PT);Wheelchair cushion (measurements PT);Hospital bed;3in1 (PT)    Recommendations for Other Services       Precautions / Restrictions Precautions Precautions: Fall;Other (comment) Precaution Comments: Cortrak, mitts Restrictions Weight Bearing Restrictions: No      Mobility  Bed Mobility Overal bed mobility: Needs Assistance Bed Mobility: Supine to Sit;Sit to Supine;Rolling Rolling: Max assist   Supine to sit: Max assist;+2 for physical assistance Sit to supine: Max assist;+2 for physical assistance   General bed mobility comments: Rolling to right and left with maxA, pt able to initiate. MaxA + 2 for supine <> sit, with assist for BLE's and trunk    Transfers                 General transfer comment: deferred  Ambulation/Gait                Stairs             Wheelchair Mobility    Modified Rankin (Stroke Patients Only)       Balance Overall balance assessment: Needs assistance Sitting-balance support: Feet supported Sitting balance-Leahy Scale: Poor Sitting balance - Comments: requiring min-max assist, decreased trunk control                                     Pertinent Vitals/Pain Pain Assessment: Faces Faces Pain Scale: Hurts little more Pain Location: bottom with peri care Pain Descriptors / Indicators: Other (Comment) (restless) Pain Intervention(s): Monitored during session    Home Living Family/patient expects to be discharged to:: Private residence Living Arrangements: Spouse/significant other (fiance) Available Help at Discharge: Family Type of Home: House Home Access: Stairs to enter   [employer] of Steps: 2 (back entrance; 4 at front entrance)          Prior Function Level of Independence: Independent         Comments: 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 [employer]        Extremity/Trunk Assessment   Upper Extremity Assessment Upper Extremity Assessment: RUE deficits/detail;LUE deficits/detail RUE Deficits / Details: Shoulder ROM WFL LUE Deficits / Details: Shoulder ROM WFL    Lower Extremity Assessment Lower Extremity Assessment: RLE deficits/detail;LLE deficits/detail RLE Deficits / Details: Hip/knee/ankle ROM WFL LLE Deficits / Details: Hip/knee/ankle ROM WFL       Communication   Communication: Other (comment) (  nonverbal during session)  Cognition Arousal/Alertness: Awake/alert Behavior During Therapy: Restless Overall Cognitive Status: Impaired/Different from baseline Area of Impairment: Attention;Following commands;Safety/judgement;Awareness;Rancho level               Rancho Levels of Cognitive Functioning Rancho Los Amigos Scales of Cognitive Functioning: Confused/agitated   Current Attention Level: Focused   Following Commands:  Follows one step commands inconsistently Safety/Judgement: Decreased awareness of safety;Decreased awareness of deficits Awareness: Intellectual   General Comments: Pt following ~25% of 1 step commands with repetition, visually tracking, nodding head "yes," to one question. Restless and attempting to pull at cortrak      General Comments      Exercises General Exercises - Upper Extremity Shoulder Flexion: AAROM;Both;10 reps;Supine General Exercises - Lower Extremity Heel Slides: AAROM;Both;5 reps;Supine   Assessment/Plan    PT Assessment Patient needs continued PT services  PT Problem List Decreased strength;Decreased activity tolerance;Decreased balance;Decreased mobility;Decreased cognition;Decreased safety awareness       PT Treatment Interventions DME instruction;Gait training;Functional mobility training;Therapeutic activities;Therapeutic exercise;Balance training;Patient/family education    PT Goals (Current goals can be found in the Care Plan section)  Acute Rehab PT Goals Patient Stated Goal: pt fiance would like him to get stronger PT Goal Formulation: With patient/family Time For Goal Achievement: 08/29/20 Potential to Achieve Goals: Fair    Frequency Min 3X/week   Barriers to discharge        Co-evaluation               AM-PAC PT "6 Clicks" Mobility  Outcome Measure Help needed turning from your back to your side while in a flat bed without using bedrails?: Total Help needed moving from lying on your back to sitting on the side of a flat bed without using bedrails?: Total Help needed moving to and from a bed to a chair (including a wheelchair)?: Total Help needed standing up from a chair using your arms (e.g., wheelchair or bedside chair)?: Total Help needed to walk in hospital room?: Total Help needed climbing 3-5 steps with a railing? : Total 6 Click Score: 6    End of Session   Activity Tolerance: Patient tolerated treatment well Patient left:  in bed;with call bell/phone within reach;with bed alarm set;with family/visitor present Nurse Communication: Mobility status PT Visit Diagnosis: Other abnormalities of gait and mobility (R26.89)    Time: 1030-1057 PT Time Calculation (min) (ACUTE ONLY): 27 min   Charges:   PT Evaluation $PT Eval Moderate Complexity: 1 Mod PT Treatments $Therapeutic Activity: 8-22 mins        Wyona Almas, PT, DPT Acute Rehabilitation Services Pager [phone] Office [phone]   Deno Etienne 08/15/2020, 12:31 PM

## 2020-08-15 NOTE — Progress Notes (Signed)
Informed the patient's fiance at the bedside that the procedure is scheduled for Thursday instead of Wednesday. Time is unknown at this time.

## 2020-08-15 NOTE — Progress Notes (Signed)
Rehab Admissions Coordinator Note:  Per PT recommendation, this patient was screened by Cheri Rous for appropriateness for an Inpatient Acute Rehab Consult.  At this time, we are recommending Inpatient Rehab consult. AC will place consult order per protocol.   Cheri Rous 08/15/2020, 1:00 PM  I can be reached at [phone].

## 2020-08-15 NOTE — Consult Note (Signed)
Physical Medicine and Rehabilitation Consult Reason for Consult: Anoxic BI Referring Physician: [facility]   HPI: Antonio Navarro is a 59 y.o. right-handed male with history of tobacco abuse, chronic back pain, obesity, hypertension.  Per chart review patient lives with fianc independent prior to admission working [employer].  Presented 07/22/2020 with vague chest discomfort in the midsternal area but no associated symptoms of shortness of breath became unresponsive.  EMS was called because of his size CPR could not be performed in the bed.  He was found to be in V. fib received total of 6 rounds of epi and defib x6 with 1 round of amnio.  He required intubation..  Cardiac cath 95% mid circumflex stenosis treated with drug-eluting stent.  No other obstructive disease.  He is currently maintained on aspirin and awaiting plan for Brilinta.  Subcutaneous heparin has been added for DVT prophylaxis.  MRI of the brain for altered mental status showed fairly symmetric abnormal signal widespread in the hemispheric cortex and subcortical white matter with posterior circulation predominance favoring anoxic injury.  No associated hemorrhage or mass-effect.  Interventional radiology services has been consulted for possible need for gastrostomy tube placement 08/14/2020. Physical Medicine & Rehabilitation was consulted to assess candidacy for CIR. Currently somnolent and with NGT.    Review of Systems  Unable to perform ROS: Acuity of condition   Past Medical History:  Diagnosis Date  . Hypertension   . Kidney stones    Past Surgical History:  Procedure Laterality Date  . CORONARY/GRAFT ACUTE MI REVASCULARIZATION N/A 07/22/2020   Procedure: Coronary/Graft Acute MI Revascularization;  Surgeon: Lorretta Harp, MD;  Location: [place] [facility];  Service: Cardiovascular;  Laterality: N/A;  . LEFT HEART CATH AND CORONARY ANGIOGRAPHY N/A 07/22/2020   Procedure: LEFT HEART CATH AND CORONARY  ANGIOGRAPHY;  Surgeon: Lorretta Harp, MD;  Location: [facility];  Service: Cardiovascular;  Laterality: N/A;   Family History  Problem Relation Age of Onset  . Cancer Mother   . Hypertension Father    Social History:  reports that he has been smoking cigarettes. He has been smoking about 1.00 pack per day. He has never used smokeless tobacco. He reports current alcohol use. He reports current drug use. Drug: Marijuana. Allergies: No Known Allergies Medications Prior to Admission  Medication Sig Dispense Refill  . amLODipine (NORVASC) 10 MG tablet Take 1 tablet (10 mg total) by mouth daily. 30 tablet 0  . cyclobenzaprine (FLEXERIL) 5 MG tablet Take 1-2 tablets (5-10 mg total) by mouth at bedtime. 15 tablet 0  . gabapentin (NEURONTIN) 300 MG capsule Take 1 capsule (300 mg total) by mouth 3 (three) times daily. 30 capsule 0  . tiZANidine (ZANAFLEX) 4 MG tablet Take 1 tablet (4 mg total) by mouth every 8 (eight) hours as needed. 30 tablet 0  . traMADol (ULTRAM) 50 MG tablet Take 1 tablet (50 mg total) by mouth every 6 (six) hours as needed for severe pain. 20 tablet 0  . aspirin-acetaminophen-caffeine (EXCEDRIN MIGRAINE) [id number] MG per tablet Take 1 tablet by mouth every 6 (six) hours as needed (toothache). (Patient not taking: Reported on 07/22/2020)    . lidocaine (LIDODERM) 5 % Place 1 patch onto the skin daily. Remove & Discard patch within 12 hours or as directed by MD (Patient not taking: Reported on 07/22/2020) 30 patch 0  . predniSONE (STERAPRED UNI-PAK 21 TAB) 10 MG (21) TBPK tablet Take by mouth daily. Per box instruction (  Patient not taking: Reported on 07/22/2020) 21 tablet 0    Home: [place] expects to be discharged to:: Private residence Living Arrangements: Spouse/significant other (fiance) Available Help at Discharge: Family Type of Home: House Home Access: Stairs to enter [employer] of Steps: 2 (back entrance; 4 at front entrance)   Functional History: Prior Function Level of Independence: Independent Comments: Works in [employer]:  Mobility: Bed Mobility Overal bed mobility: Needs Assistance Bed Mobility: Supine to Sit,Sit to [employer]: Max assist Supine to sit: Max assist,+2 for physical assistance Sit to supine: Max assist,+2 for physical assistance General bed mobility comments: Rolling to right and left with maxA, pt able to initiate. MaxA + 2 for supine <> sit, with assist for BLE's and trunk Transfers General transfer comment: deferred      ADL:    Cognition: Cognition Overall Cognitive Status: Impaired/Different from baseline Orientation Level: Oriented to person,Oriented to place,Disoriented to time,Disoriented to situation [employer] Scales of Cognitive Functioning: Confused/agitated Cognition Arousal/Alertness: Awake/alert Behavior During Therapy: Restless Overall Cognitive Status: Impaired/Different from baseline Area of Impairment: Attention,Following commands,Safety/judgement,Awareness,Rancho level Current Attention Level: Focused Following Commands: Follows one step commands inconsistently Safety/Judgement: Decreased awareness of safety,Decreased awareness of deficits Awareness: Intellectual General Comments: Pt following ~25% of 1 step commands with repetition, visually tracking, nodding head "yes," to one question. Restless and attempting to pull at cortrak  Blood pressure 126/85, pulse 80, temperature 98.4 F (36.9 C), temperature source Oral, resp. rate 16, height 6' (1.829 m), weight 90.1 kg, SpO2 100 %. Physical Exam General: Alert, No apparent distress HEENT: Head is normocephalic, NGT in place Neck: Supple without JVD or lymphadenopathy Heart: Reg rate and rhythm. No murmurs rubs or gallops Chest: CTA bilaterally without wheezes, rales, or rhonchi; no distress Abdomen: Soft, non-tender, non-distended, bowel sounds  positive. Extremities: No clubbing, cyanosis, or edema. Pulses are 2+ Skin: Clean and intact without signs of breakdown Neuro: Pt is cognitively appropriate with normal insight, memory, and awareness. Cranial nerves 2-12 are intact. Patient is quite somnolent.  He would initially open his eyes on verbal response but would fall back to sleep.  He was nonverbal for me.  Nasogastric tube in place.  He did not follow commands.   Psych: Pt's affect is somnolent. Decreased awareness  Results for orders placed or performed during the hospital encounter of 07/22/20 (from the past 24 hour(s))  Glucose, capillary     Status: Abnormal   Collection Time: 08/14/20  3:55 PM  Result Value Ref Range   Glucose-Capillary 206 (H) 70 - 99 mg/dL  Glucose, capillary     Status: Abnormal   Collection Time: 08/14/20  8:12 PM  Result Value Ref Range   Glucose-Capillary 154 (H) 70 - 99 mg/dL   Comment 1 Notify RN    Comment 2 Document in Chart   Glucose, capillary     Status: Abnormal   Collection Time: 08/15/20 12:04 AM  Result Value Ref Range   Glucose-Capillary 183 (H) 70 - 99 mg/dL   Comment 1 Notify RN    Comment 2 Document in Chart   Glucose, capillary     Status: Abnormal   Collection Time: 08/15/20  4:11 AM  Result Value Ref Range   Glucose-Capillary 184 (H) 70 - 99 mg/dL   Comment 1 Notify RN    Comment 2 Document in Chart   Glucose, capillary     Status: Abnormal   Collection Time: 08/15/20  7:39 AM  Result Value  Ref Range   Glucose-Capillary 133 (H) 70 - 99 mg/dL  Glucose, capillary     Status: Abnormal   Collection Time: 08/15/20 11:17 AM  Result Value Ref Range   Glucose-Capillary 179 (H) 70 - 99 mg/dL   No results found.   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 Does the need for close, 24 hr/day medical supervision in concert with the patient's rehab needs make it unreasonable for this patient to be served in a less intensive setting?  Yes 2. Co-Morbidities requiring supervision/potential complications: HTN, acute ST elevation MI, dysphagia, chronic back pain  1) Impaired mobility and ADLs: Mr. Edmiston has the medical necessity for inpatient rehab, but currently does not have the tolerance for 3 hours per day of inpatient rehab.  2) Anoxic brain injury: continue SLP for cognitive deficits 3) NPO: continue discussions of PEG placement 4) Rancho level IV: We will continue to follow to assess if patient's tolerance for therapy improves.  Thank you for this consult. Admission coordinator to follow.   I have personally performed a face to face diagnostic evaluation, including, but not limited to relevant history and physical exam findings, of this patient and developed relevant assessment and plan.  Additionally, I have reviewed and concur with the physician assistant's documentation above.  Sula Soda, MD

## 2020-08-15 NOTE — Progress Notes (Signed)
Inpatient Rehabilitation Admissions Coordinator   Please refer to Dr. Richmond Campbell consult. Patient currently not a candidate for Cir admit. Not able to tolerate the intensity of an inpt rehab admit at this time. I have alerted acute team and TOC. Other rehab venue options need to be explored. I will follow at a distance.  Ottie Glazier, RN, MSN Rehab Admissions Coordinator [phone] 08/15/2020 4:58 PM

## 2020-08-15 NOTE — Progress Notes (Signed)
Progress Note  Patient Name: Antonio Navarro Date of Encounter: 08/15/2020  Primary Cardiologist:   Fransico Him, MD   Subjective   Opens eyes and seems to answer yes no questions with a shake of his head.  Non verbal  Inpatient Medications    Scheduled Meds: . aspirin  81 mg Per Tube Daily  . atorvastatin  80 mg Per Tube Daily  . bethanechol  10 mg Per Tube TID  . carvedilol  25 mg Per Tube BID WC  . chlorhexidine gluconate (MEDLINE KIT)  15 mL Mouth Rinse BID  . feeding supplement (PROSource TF)  45 mL Per Tube BID  . free water  200 mL Per Tube Q3H  . heparin  5,000 Units Subcutaneous Q8H  . hydrALAZINE  50 mg Per Tube Q8H  . insulin aspart  0-20 Units Subcutaneous Q4H  . insulin detemir  30 Units Subcutaneous BID  . isosorbide dinitrate  30 mg Per Tube TID  . mouth rinse  15 mL Mouth Rinse BID  . polyvinyl alcohol  1 drop Both Eyes QID  . sodium chloride flush  3 mL Intravenous Q12H  . ticagrelor  90 mg Per Tube BID   Continuous Infusions: . sodium chloride Stopped (07/22/20 0955)  . feeding supplement (JEVITY 1.5 CAL/FIBER) 1,000 mL (08/14/20 2130)   PRN Meds: acetaminophen, docusate, Gerhardt's butt cream, metoprolol tartrate, polyethylene glycol, sodium chloride flush   Vital Signs    Vitals:   08/14/20 1937 08/15/20 0010 08/15/20 0315 08/15/20 0414  BP: 105/68   139/84  Pulse: 83   80  Resp: 20   18  Temp: (!) 97.5 F (36.4 C)   98.7 F (37.1 C)  TempSrc: Oral     SpO2: 100%   100%  Weight:  90.1 kg 90.1 kg   Height:        Intake/Output Summary (Last 24 hours) at 08/15/2020 0827 Last data filed at 08/15/2020 0612 Gross per 24 hour  Intake 3240 ml  Output 2350 ml  Net 890 ml   Filed Weights   08/14/20 0209 08/15/20 0010 08/15/20 0315  Weight: 89.5 kg 90.1 kg 90.1 kg    TELEMETRY    NSR - Personally Reviewed  Physical Exam   GEN:  No distress  Cardiac: RRR Respiratory: Decreased breath sounds without crackles GI: Positive bowel  sounds, no rebound no guarding Neuro:  Moves extremities Psych: Unable to assess.    Labs    ChemistryNo results for input(s): NA, K, CL, CO2, GLUCOSE, BUN, CREATININE, CALCIUM, PROT, ALBUMIN, AST, ALT, ALKPHOS, BILITOT, GFRNONAA, GFRAA, ANIONGAP in the last 168 hours.   HematologyNo results for input(s): WBC, RBC, HGB, HCT, MCV, MCH, MCHC, RDW, PLT in the last 168 hours.  Cardiac EnzymesNo results for input(s): TROPONINI in the last 168 hours. No results for input(s): TROPIPOC in the last 168 hours.   BNPNo results for input(s): BNP, PROBNP in the last 168 hours.   DDimer No results for input(s): DDIMER in the last 168 hours.   Radiology    No results found.  Cardiac Studies   Cardiac cath  Diagnostic Dominance: Right    Intervention       Patient Profile     59 y.o. male with history of tobacco abuse, obesity, HTN who was admitted 07/22/20 post cardiac arrest with inferolateral STEMI. He was resuscitated in the field by EMS after being found to be in V fib. Approximately 30 minutes of CPR. He was intubated on arrival.  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 No other obstructive disease. LvEF=40-45% by echo 07/23/20.   Assessment & Plan    CARDIAC ARREST:   For probable PEG placement.  I suspect he can have his Brilinta reduced today if the plans are confirmed for the placement of the PEG and then we can consider the timing of initiating the low dose cangrelor likely 36 to 48 hours after the last dose of Brilinta.    ISCHEMIC CARDIOMYOPATHY:  I will change to BiDil for ease of administration.     For questions or updates, please contact [facility] Please consult [email or web address] for contact info under Cardiology/STEMI.   Signed, Minus Breeding, MD  08/15/2020, 8:27 AM

## 2020-08-15 NOTE — Consult Note (Signed)
Chief Complaint: Patient was seen in consultation today for gastrostomy tube placement.   Referring Physician(s): Dr. Wynelle Cleveland  Supervising Physician: Jacqulynn Cadet  Patient Status: [facility] - In-pt  History of Present Illness: Antonio Navarro is a 59 y.o. male with a medical history significant for hypertension and kidney stones. He was brought to the ED 07/22/20 by EMS for cardiac arrest. He was discovered at home by his significant other with agonal respirations. EMS found him to be in ventricular fibrillation and CPR commenced for approximately 30 minutes before return of spontaneous circulation. He was intubated and admitted for STEMI. He was taken to the Cath lab and had a DES placed to his circumflex. He was started on Brilinta.   He has since been extubated and transferred out of the ICU. He is able to work with physical therapy and has made progress with his recovery. He is still experiencing dysphagia and has been receiving nutrition via a feeding tube.  Interventional Radiology has been asked to evaluate this patient for an image-guided gastrostomy tube placement for long-term nutritional needs and possible transfer to SNF. This case has been reviewed and procedure approved by Dr. Laurence Ferrari.    Past Medical History:  Diagnosis Date  . Hypertension   . Kidney stones     Past Surgical History:  Procedure Laterality Date  . CORONARY/GRAFT ACUTE MI REVASCULARIZATION N/A 07/22/2020   Procedure: Coronary/Graft Acute MI Revascularization;  Surgeon: Lorretta Harp, MD;  Location: [place] [facility];  Service: Cardiovascular;  Laterality: N/A;  . LEFT HEART CATH AND CORONARY ANGIOGRAPHY N/A 07/22/2020   Procedure: LEFT HEART CATH AND CORONARY ANGIOGRAPHY;  Surgeon: Lorretta Harp, MD;  Location: [place] [facility];  Service: Cardiovascular;  Laterality: N/A;    Allergies: Patient has no known allergies.  Medications: Prior to Admission medications   Medication Sig Start  Date End Date Taking? Authorizing Provider  amLODipine (NORVASC) 10 MG tablet Take 1 tablet (10 mg total) by mouth daily. 05/15/20  Yes Burky, Lanelle Bal B, NP  cyclobenzaprine (FLEXERIL) 5 MG tablet Take 1-2 tablets (5-10 mg total) by mouth at bedtime. 05/15/20  Yes Burky, Lanelle Bal B, NP  gabapentin (NEURONTIN) 300 MG capsule Take 1 capsule (300 mg total) by mouth 3 (three) times daily. 05/22/20  Yes Jaynee Eagles, PA-C  tiZANidine (ZANAFLEX) 4 MG tablet Take 1 tablet (4 mg total) by mouth every 8 (eight) hours as needed. 05/22/20  Yes Jaynee Eagles, PA-C  traMADol (ULTRAM) 50 MG tablet Take 1 tablet (50 mg total) by mouth every 6 (six) hours as needed for severe pain. 05/22/20  Yes Jaynee Eagles, PA-C  aspirin-acetaminophen-caffeine (EXCEDRIN MIGRAINE) [phone] MG per tablet Take 1 tablet by mouth every 6 (six) hours as needed (toothache). Patient not taking: Reported on 07/22/2020    [provider]  lidocaine (LIDODERM) 5 % Place 1 patch onto the skin daily. Remove & Discard patch within 12 hours or as directed by MD Patient not taking: Reported on 07/22/2020 06/02/20   Alfredia Client, PA-C  predniSONE (STERAPRED UNI-PAK 21 TAB) 10 MG (21) TBPK tablet Take by mouth daily. Per box instruction Patient not taking: Reported on 07/22/2020 05/15/20   Zigmund Gottron, NP     Family History  Problem Relation Age of Onset  . Cancer Mother   . Hypertension Father     Social History   Socioeconomic History  . Marital status: Single    Spouse name: Not on file  . Number of children: Not  on file  . Years of education: Not on file  . Highest education level: Not on file  Occupational History  . Not on file  Tobacco Use  . Smoking status: Current Every Day Smoker    Packs/day: 1.00    Types: Cigarettes  . Smokeless tobacco: Never Used  Vaping Use  . Vaping Use: Never used  Substance and Sexual Activity  . Alcohol use: Yes    Comment: occasional  . Drug use: Yes    Types: Marijuana    Comment:  occ  . Sexual activity: Not on file  Other Topics Concern  . Not on file  Social History Narrative  . Not on file   Social Determinants of Health   Financial Resource Strain: Not on file  Food Insecurity: Not on file  Transportation Needs: Not on file  Physical Activity: Not on file  Stress: Not on file  Social Connections: Not on file    Review of Systems: A 12 point ROS discussed and pertinent positives are indicated in the HPI above.  All other systems are negative.  Review of Systems  Unable to perform ROS: Mental status change    Vital Signs: BP 126/85 (BP Location: Left Arm)   Pulse 80   Temp 98.4 F (36.9 C) (Oral)   Resp 16   Ht 6' (1.829 m)   Wt 198 lb 10.2 oz (90.1 kg)   SpO2 100%   BMI 26.94 kg/m   Physical Exam Constitutional:      General: He is not in acute distress. HENT:     Mouth/Throat:     Mouth: Mucous membranes are moist.     Pharynx: Oropharynx is clear.  Cardiovascular:     Rate and Rhythm: Normal rate and regular rhythm.     Pulses: Normal pulses.     Heart sounds: Normal heart sounds.  Pulmonary:     Effort: Pulmonary effort is normal.     Breath sounds: Normal breath sounds.  Abdominal:     General: Bowel sounds are normal.     Palpations: Abdomen is soft.  Skin:    General: Skin is warm and dry.  Neurological:     Mental Status: He is alert.     Comments: Unable to fully assess; anoxic brain injury. He was able to follow some commands but was non-verbal during my visit.      Imaging: EEG  Result Date: 07/27/2020 Antonio Havens, MD     07/27/2020  9:42 AM Patient Name: Antonio Navarro MRN: [id number] Epilepsy Attending: Lora Navarro Referring Physician/Provider: Dr Ina Homes Date: 07/27/2020 Duration: 25.36 mins  Patient history: 59yo M s/p cardiac arrest. EEG to evaluate for seizure  Level of alertness: comatose  AEDs during EEG study: Propofol  Technical aspects: This EEG study was done with scalp electrodes  positioned according to the 10-20 International system of electrode placement. Electrical activity was acquired at a sampling rate of _0  and reviewed with a high frequency filter of _1  and a low frequency filter of _2 . EEG data were recorded continuously and digitally stored.  Description: EEG showed continuous generalized 6-_3  theta-alpha activity admixed with intermittent generalized 2-_4  delta slowing.  ABNORMALITY -Continuous slow, generalized  IMPRESSION: This study issuggestive ofseverediffuse encephalopathy, nonspecific etiology but likely related to sedation, anoxic-hypoxic injury.No seizures or epileptiform discharges were seen throughout the recording.  EEG appears to be similar to previous eeg on 07/24/2020.  Antonio Navarro  CT ABDOMEN WO CONTRAST  Result Date:  08/10/2020 CLINICAL DATA:  Evaluate gastric anatomy prior to potential percutaneous gastrostomy tube placement. EXAM: CT ABDOMEN WITHOUT CONTRAST TECHNIQUE: Multidetector CT imaging of the abdomen was performed following the standard protocol without IV contrast. COMPARISON:  CT abdomen pelvis-08/02/2016 FINDINGS: The lack of intravenous contrast limits the ability to evaluate solid abdominal organs. Lower chest: Limited visualization of the lower thorax demonstrates minimal subsegmental atelectasis involving the imaged portions of the bilateral lower lobes. No discrete focal airspace opacities. No pleural effusion. Normal heart size. Coronary artery calcifications. No pericardial effusion. Hepatobiliary: Normal hepatic contour. Normal noncontrast appearance of the gallbladder given underdistention. No ascites. Pancreas: Normal noncontrast appearance of the pancreas. Spleen: Normal noncontrast appearance of the spleen. Adrenals/Urinary Tract: Punctate nonobstructing renal stones are seen bilaterally with dominant nonobstructing right-sided renal stone measuring 0.4 cm in greatest short axis diameter (coronal image 89,  series 6). There is a minimal amount of likely body habitus related perinephric stranding. No urinary obstruction. Normal noncontrast appearance the bilateral adrenal glands. The urinary bladder was not imaged. Stomach/Bowel: The anterior wall of the stomach is fairly well apposed against the ventral wall of the upper abdomen and the percutaneous window will likely be improved with gastric insufflation. Enteric tube tip terminates within the gastric antrum. Moderate colonic stool burden without evidence of enteric obstruction. No pneumoperitoneum, pneumatosis or portal venous gas. Vascular/Lymphatic: Atherosclerotic plaque within a normal caliber abdominal aorta. No bulky retroperitoneal or mesenteric lymphadenopathy on this noncontrast examination. Other: Nodular foci of subcutaneous edema noted about the abdominal pannus. Musculoskeletal: Mixed subacute and acute fractures involving the anterior aspects of the bilateral 4th - 6th ribs. Mild-to-moderate multilevel lumbar spine DDD, worse at L4-L5 with disc space height loss, endplate irregularity and sclerosis. Stigmata of dish within the lower thoracic spine. IMPRESSION: 1. Gastric anatomy amenable to attempted percutaneous gastrostomy tube placement as indicated. 2. Mixed subacute and acute minimally displaced fractures involving the anterior aspects of the bilateral 4th through 6th ribs. 3. Nonobstructing bilateral nephrolithiasis. 4. Coronary calcifications.  Aortic Atherosclerosis (ICD10-I70.0). Electronically Signed   By: Sandi Mariscal M.D.   On: 08/10/2020 11:14   DG Abd 1 View  Result Date: 08/06/2020 CLINICAL DATA:  Enteric catheter placement EXAM: ABDOMEN - 1 VIEW COMPARISON:  08/06/2020 at 1:13 p.m. FINDINGS: Frontal view of the lower chest and upper abdomen demonstrates stable position of the enteric catheter, tip overlying the gastric antrum. Bowel gas pattern is unremarkable. IMPRESSION: 1. Enteric catheter tip overlying gastric antrum.  Electronically Signed   By: Randa Ngo M.D.   On: 08/06/2020 16:20   DG Abd 1 View  Result Date: 08/06/2020 CLINICAL DATA:  Enteric tube placement EXAM: ABDOMEN - 1 VIEW COMPARISON:  Abdominal radiograph from earlier today FINDINGS: The enteric tube tip in the distal stomach. No disproportionately dilated small bowel loops. No evidence of pneumatosis or pneumoperitoneum. IMPRESSION: Enteric tube tip in the distal stomach. Electronically Signed   By: Ilona Sorrel M.D.   On: 08/06/2020 13:42   DG Abd 1 View  Result Date: 07/31/2020 CLINICAL DATA:  NG tube placement EXAM: ABDOMEN - 1 VIEW COMPARISON:  None. FINDINGS: Esophageal tube is mildly looped within the region of gastric fundus. Upper gas pattern is unremarkable IMPRESSION: Esophageal tube tip overlies the gastric fundus. Electronically Signed   By: Donavan Foil M.D.   On: 07/31/2020 17:34   CT HEAD WO CONTRAST  Result Date: 07/22/2020 CLINICAL DATA:  Acute neuro deficit.  Cardiac arrest. EXAM: CT HEAD WITHOUT CONTRAST TECHNIQUE: Contiguous axial images were  obtained from the base of the skull through the vertex without intravenous contrast. COMPARISON:  None. FINDINGS: Brain: No evidence of acute infarction, hemorrhage, hydrocephalus, extra-axial collection or mass lesion/mass effect. Vascular: Vascular enhancement due to coronary angiography earlier today. Skull: Negative Sinuses/Orbits: Mucosal edema in the paranasal sinuses with air-fluid level in the maxillary sinus bilaterally. Negative orbit Other: None IMPRESSION: Negative CT head. Note patient had intravenous contrast earlier today for coronary angiography Mucosal edema throughout the paranasal sinuses with air-fluid levels in the maxillary sinus bilaterally. Electronically Signed   By: Franchot Gallo M.D.   On: 07/22/2020 09:39   MR BRAIN WO CONTRAST  Addendum Date: 07/25/2020   ADDENDUM REPORT: 07/25/2020 19:21 ADDENDUM: Study discussed by telephone with Critical Care Dr. Ina Homes on 07/25/2020 at 1902 hours. I advised that I feel Neurology consultation would be valuable. Electronically Signed   By: Genevie Ann M.D.   On: 07/25/2020 19:21   Result Date: 07/25/2020 CLINICAL DATA:  59 year old male with worsening encephalopathy status post cardiac arrest. EXAM: MRI HEAD WITHOUT CONTRAST TECHNIQUE: Multiplanar, multiecho pulse sequences of the brain and surrounding structures were obtained without intravenous contrast. COMPARISON:  Head CT 07/22/2020. FINDINGS: Brain: Widespread abnormal cortical signal abnormality with a posterior circulation and superior bifrontal predominance. The appearance consists of intense abnormal trace diffusion (series 2, image 25), with isointense ADC, associated T2 and FLAIR hyperintensity. Similar scattered subcortical white matter involvement also in both hemispheres. There is more subtle abnormal signal in the bilateral thalami (series 6 image 14). The basal ganglia appear within normal limits. The brainstem is spared. Occasional small areas of restricted diffusion in the cerebellum (series 2, image 9). No superimposed acute intracranial hemorrhage, mass effect, or ventriculomegaly. No evidence of mass lesion, extra-axial collection. Cervicomedullary junction and pituitary are within normal limits. Vascular: Major intracranial vascular flow voids are preserved. Skull and upper cervical spine: Negative. Visualized bone marrow signal is within normal limits. Sinuses/Orbits: Negative orbits. Intubated with fluid and mucosal thickening in the bilateral paranasal sinuses and visible pharynx. Other: Mild bilateral mastoid effusions. IMPRESSION: 1. Fairly symmetric abnormal signal is widespread in the hemispheric cortex and subcortical white matter with a posterior circulation predominance. Although posterior reversible encephalopathy syndrome (PRES) can have a similar appearance, the lack of facilitated diffusion in this case favors widespread Anoxic Injury.  Bithalamic involvement also suspected. Trace cerebellar involvement. 2. No associated hemorrhage or mass effect. Electronically Signed: By: Genevie Ann M.D. On: 07/25/2020 17:58   CARDIAC CATHETERIZATION  Result Date: 07/22/2020  Prox RCA lesion is 40% stenosed.  Mid Cx lesion is 95% stenosed.  A drug-eluting stent was successfully placed using a [place] [id number].  Post intervention, there is a 0% residual stenosis.  Antonio Navarro is a 59 y.o. male  [id number] LOCATION:  FACILITY: [place] PHYSICIAN: Antonio Navarro, M.D. [birth year] DATE OF PROCEDURE:  07/22/2020 DATE OF DISCHARGE: CARDIAC CATHETERIZATION / PCI DES RCA History obtained from chart review. Mr. Spooner is a 59yo AAM with a hx of HTN and chronic back pain but no other hx.  Hx is obtained by his fiance this evening.  He was in his USOH until yesterday when he developed vague chest discomfort in the midsternal area but no associated sx of SOB, N/V or diaphoresis.  He took some TUMS.  Later in the evening around 7pm he went to [facility] with his fiance and got a pizza to eat and then felt chest discomfort again.  He watched TV that evening and went  to bed around 10:30pm and around 3am his fiance noted what she thought was snoring and she nudged him and he moved and the snoring stopped and she went back to sleep.  About 5 minutes later he made the same noise and she nudged him again and tried to arouse him and he would not respond.  She noticed that his eyes were open but he would not respond. She called EMS but could not do CPR because he was too large for her to move off the bed.  EMS arrived about 10 minutes later and he was found to be in Vfib.  He received a total of 6 rounds of epi and defib x 6 and 1 round of Amio with ROSR.  Now intubated and sedated and hemodynamically stable.  He has a hx of tobacco abuse smoking 1/2ppd and fm hx of father having CHF but no fm hx of SCD.  According to his fiance he had been out working in the yard earlier that  week with no problems and was quite active. His fiance says that he has been vaccinated for COVID 19. Dr. Radford Pax saw the patient in the ER and noticed worsening EKG changes. He remained hemodynamically stable, sedated and intubated without purposeful movements. It was elected to bring him to the Cath Lab for urgent angiography and potential intervention.Marland Kitchen PROCEDURE DESCRIPTION: The patient was brought to the second floor [facility] cath lab in the postabsorptive state. He was not premedicated . His right groin was prepped and shaved in usual sterile fashion. Xylocaine 1% was used for local anesthesia. A 6 French sheath was inserted into the right common femoral  artery using standard Seldinger technique. 5 French right and left echo status and catheters were used for selective coronary angiography and obtain left heart pressures. Isovue dye was used for the entirety of the case. Retrograde aorta, ventricular and pullback pressures were recorded. Patient received 180 mg of crushed Brilinta down the OG tube. Patient received an additional 7000 units of heparin with an ACT of 279. A total of 100 cc of contrast was administered to the patient. LVEDP was 16. The patient did have a 40% proximal to mid RCA stenosis that looked hypodense but did not look like the culprit lesion. There was TIMI-3 flow down the RCA. It appeared the culprit lesion was the AV groove circumflex after a marginal branch. Using a 6 [place] XB 3-1/2 cm guide catheter along with a 0.14 [place] guidewire and a 2 mm x 12 mm balloon the lesion was crossed and predilated. Medtronic Onyx 2.25 mm x 12 mm long stent was then carefully positioned and deployed at 12 atm (2.3 mm) resulting reduction of a 95 to 98% AV groove circumflex 0% residual. The marginal branch appeared intact with only mild narrowing of the ostium. The stent appeared to the well sized for the vessel. Postdilatation was not performed.   Mr. Noyce was witnessed cardiac arrest  with defibrillation CPR. Initial rhythm was VF. The ROSC according to the ER doctor was approximate 30 minutes. The culprit lesion appeared to be an AV groove circumflex stenosis which was stented with a 2.25 x 12 mm long resolute Onyx drug-eluting stent. Patient remained hemodynamically stable throughout the procedure. He did receive 180 mg of crushed Brilinta down the OG tube but because of vomiting and suctioning I am not sure how much of that remained in his system and therefore I elected to bolus him with cangrelor and put him on an  occasion. We will reattempt to give him crushed ticagrelor down his OG tube once he stops vomiting after which we can stop the Cangrelor drip. The sheath will be removed removed once ACT falls below 170 and pressure held. Heparin will be restarted 6 hours after sheath removal without a bolus. 2D echo will be obtained. PCCM will be admitting. It is unclear at this point whether the patient will recover neurologically. Antonio Navarro. MD, [facility] 07/22/2020 6:42 AM   DG CHEST PORT 1 VIEW  Result Date: 07/30/2020 CLINICAL DATA:  Respiratory distress EXAM: PORTABLE CHEST 1 VIEW COMPARISON:  07/28/2020 FINDINGS: Endotracheal tube and NG tube remain in place, unchanged. Improved aeration in the lung bases. Residual linear atelectasis in the medial left lung base. No confluent opacity on the right. No effusions. IMPRESSION: Improving aeration in the lower lobes. Residual left base atelectasis. Electronically Signed   By: Rolm Baptise M.D.   On: 07/30/2020 03:52   DG Chest Port 1 View  Result Date: 07/28/2020 CLINICAL DATA:  CPR.  Cardiac arrest. EXAM: PORTABLE CHEST 1 VIEW COMPARISON:  07/23/2020. FINDINGS: Interim removal of right IJ line. Endotracheal tube and NG tube in stable position. Heart size normal. Interim removal of right IJ line. Heart size normal. Right mid lung and bibasilar atelectasis and infiltrates. Small left pleural effusion cannot be excluded. No pneumothorax.  IMPRESSION: 1. Interim removal of right IJ line. Endotracheal tube and NG tube in stable position. Interim removal of right IJ line. 2. Right mid lung and bibasilar atelectasis and infiltrates. Small left pleural effusion cannot be excluded. Electronically Signed   By: Marcello Moores  Register   On: 07/28/2020 07:58   DG Chest Port 1 View  Result Date: 07/23/2020 CLINICAL DATA:  Recent cardiac arrest EXAM: PORTABLE CHEST 1 VIEW COMPARISON:  07/22/2020 FINDINGS: Endotracheal tube, gastric catheter and right jugular central line are again seen and stable. No pneumothorax is noted. Lungs are clear bilaterally. No bony abnormality is noted. IMPRESSION: No acute abnormality noted. Electronically Signed   By: Inez Catalina M.D.   On: 07/23/2020 09:23   DG CHEST PORT 1 VIEW  Result Date: 07/22/2020 CLINICAL DATA:  Check central line placement EXAM: PORTABLE CHEST 1 VIEW COMPARISON:  07/22/2020 FINDINGS: Cardiac shadow is stable. Endotracheal tube and gastric catheter are again noted and stable. New right jugular central line is noted in the proximal superior vena cava. No pneumothorax is seen. Mild vascular congestion is noted. Previously seen right basilar atelectasis has resolved. New left basilar atelectasis is seen. IMPRESSION: New left basilar atelectasis. No evidence of pneumothorax following right sided central venous line placement. Electronically Signed   By: Inez Catalina M.D.   On: 07/22/2020 12:17   DG Chest Portable 1 View  Result Date: 07/22/2020 CLINICAL DATA:  Intubation EXAM: PORTABLE CHEST 1 VIEW COMPARISON:  None. FINDINGS: Support Apparatus: --Endotracheal tube: Tip at the level of the clavicular heads. --Enteric tube:Tip and sideport project over the stomach. --Catheter(s):None --Other: None Linear opacities at the right lung base. IMPRESSION: 1. Radiographically appropriate position of endotracheal tube and nasogastric tube. 2. Right basilar atelectasis. Electronically Signed   By: Ulyses Jarred  M.D.   On: 07/22/2020 04:46   DG Abd Portable 1V  Result Date: 08/06/2020 CLINICAL DATA:  Migration of nasogastric tube. EXAM: PORTABLE ABDOMEN - 1 VIEW COMPARISON:  None. FINDINGS: The previous NG tube appears to been removed. A feeding tube is been placed in the interval with the distal tip in the region of the distal stomach.  Recommend advancement before use. IMPRESSION: The distal tip of the feeding tube is in the region of the distal stomach. Recommend advancement before use. Electronically Signed   By: Dorise Bullion III M.D   On: 08/06/2020 11:03   DG Abd Portable 1V  Result Date: 08/01/2020 CLINICAL DATA:  NG tube placement. EXAM: PORTABLE ABDOMEN - 1 VIEW COMPARISON:  07/31/2020 FINDINGS: The enteric tube has been mildly retracted and is no longer partially looped in the stomach. The tip of the tube projects in the expected region of the gastric fundus with side hole near the GE junction. No dilated loops of bowel are seen in the included portion of the abdomen to suggest obstruction. Mild bibasilar lung opacities have slightly increased. IMPRESSION: 1. Mild interval retraction of the enteric tube with tip likely in the gastric fundus and side hole near the GE junction. 2. Slightly increased bibasilar lung opacities which may reflect atelectasis. Electronically Signed   By: Logan Bores M.D.   On: 08/01/2020 08:02   EEG adult  Result Date: 07/22/2020 Antonio Havens, MD     07/22/2020 12:56 PM Patient Name: Antonio Navarro MRN: [id number] Epilepsy Attending: Lora Navarro Referring Physician/Provider: Dr Ina Homes Date: 07/22/2020 Duration: 23.10 mins Patient history: 59yo M s/p cardiac arrest. EEG to evaluate for seizure Level of alertness: comatose AEDs during EEG study: Propofol Technical aspects: This EEG study was done with scalp electrodes positioned according to the 10-20 International system of electrode placement. Electrical activity was acquired at a sampling rate of _0  and  reviewed with a high frequency filter of _1  and a low frequency filter of _2 . EEG data were recorded continuously and digitally stored. Description: EEG showed continuous generalized background attenuation. EEG is not reactive to noxious stimulation. Hyperventilation and photic stimulation were not performed.   ABNORMALITY - Background attenuation, generalized IMPRESSION: This study is suggestive of profound diffuse encephalopathy, nonspecific etiology but likely related to sedation, anoxic-hypoxic injury. No seizures or epileptiform discharges were seen throughout the recording. Antonio Navarro   Overnight EEG with video  Result Date: 07/23/2020 Antonio Havens, MD     07/24/2020  1:25 PM Patient Name: Antonio Navarro MRN: [id number] Epilepsy Attending: Lora Navarro Referring Physician/Provider: Dr Ina Homes Duration: 07/22/2020 1043 to 12/5/202 1043  Patient history: 59yo M s/p cardiac arrest. EEG to evaluate for seizure  Level of alertness: comatose  AEDs during EEG study: Propofol  Technical aspects: This EEG study was done with scalp electrodes positioned according to the 10-20 International system of electrode placement. Electrical activity was acquired at a sampling rate of _3  and reviewed with a high frequency filter of _4  and a low frequency filter of _5 . EEG data were recorded continuously and digitally stored.  Description: EEG initially showed generalized attenuation which gradually evolved to burst attenuation with bursts of generalized 4-_6  theta slowing lasting 1-2 seconds and attenuation for 5-7 seconds. Gradually the periods of attenuation became shorter and eeg appeared discontinuous. Event button was pressed on 07/22/2020 at 1259, 1319, 1321 and 1401 during which patient was noted to be trembling/shivering. Concomitant eeg before, during and after the event didn't show any eeg change to suggest seizure  ABNORMALITY - Burst attenuation, generalized  IMPRESSION: This study  is suggestive of profound diffuse encephalopathy, nonspecific etiology but likely related to sedation, anoxic-hypoxic injury. No seizures or epileptiform discharges were seen throughout the recording. Event button was pressed on 07/22/2020 at 1259, 1319, 1321 and 1401 during which patient was  noted to be trembling/shivering without concomitant eeg change and were NOT epileptic. EEG appears to be improving compared to routine eeg.  Antonio Navarro   ECHOCARDIOGRAM COMPLETE  Result Date: 07/23/2020    ECHOCARDIOGRAM REPORT   Patient Name:   Antonio Navarro [facility] Date of Exam: 07/23/2020 Medical Rec #:  [id number]      Height:       72.0 in Accession #:    [id number]     Weight:       215.4 lb Date of Birth:  28-Dec-1960      BSA:          2.199 m Patient Age:    72 years       BP:           110/82 mmHg Patient Gender: M              HR:           71 bpm. Exam Location:  Inpatient Procedure: 2D Echo, Cardiac Doppler, Color Doppler and Intracardiac            Opacification Agent Indications:    STEMI I21.3  History:        Patient has no prior history of Echocardiogram examinations.                 Risk Factors:Hypertension and Current Smoker.  Sonographer:    Vickie Epley RDCS Referring Phys: [id number] TRACI R TURNER  Sonographer Comments: Echo performed with patient supine and on artificial respirator and Technically difficult study due to poor echo windows. IMPRESSIONS  1. Left ventricular ejection fraction, by estimation, is 40 to 45%. The left ventricle has mildly decreased function. Left ventricular endocardial border not optimally defined to evaluate regional wall motion, but regional wall motion abnormalities are present. There is mild left ventricular hypertrophy. Left ventricular diastolic parameters are consistent with Grade I diastolic dysfunction (impaired relaxation).  2. Right ventricular systolic function is mildly reduced. The right ventricular size is normal. Tricuspid regurgitation signal is inadequate for  assessing PA pressure.  3. The aortic valve is tricuspid. Aortic valve regurgitation is not visualized. No aortic stenosis is present.  4. The mitral valve is grossly normal. No evidence of mitral valve regurgitation. No evidence of mitral stenosis.  5. The inferior vena cava is normal in size with <50% respiratory variability, suggesting right atrial pressure of 8 mmHg. FINDINGS  Left Ventricle: Left ventricular ejection fraction, by estimation, is 40 to 45%. The left ventricle has mildly decreased function. Left ventricular endocardial border not optimally defined to evaluate regional wall motion. Definity contrast agent was given IV to delineate the left ventricular endocardial borders. The left ventricular internal cavity size was normal in size. There is mild left ventricular hypertrophy. Left ventricular diastolic parameters are consistent with Grade I diastolic dysfunction (impaired relaxation).  LV Wall Scoring: The posterior wall and basal inferior segment are hypokinetic. Right Ventricle: The right ventricular size is normal. No increase in right ventricular wall thickness. Right ventricular systolic function is mildly reduced. Tricuspid regurgitation signal is inadequate for assessing PA pressure. Left Atrium: Left atrial size was normal in size. Right Atrium: Right atrial size was normal in size. Pericardium: There is no evidence of pericardial effusion. Mitral Valve: The mitral valve is grossly normal. No evidence of mitral valve regurgitation. No evidence of mitral valve stenosis. Tricuspid Valve: The tricuspid valve is grossly normal. Tricuspid valve regurgitation is trivial. No evidence of tricuspid stenosis. Aortic Valve: The aortic valve  is tricuspid. Aortic valve regurgitation is not visualized. No aortic stenosis is present. Pulmonic Valve: The pulmonic valve was not well visualized. Pulmonic valve regurgitation is not visualized. Aorta: The aortic root and ascending aorta are structurally  normal, with no evidence of dilitation. Venous: The inferior vena cava is normal in size with less than 50% respiratory variability, suggesting right atrial pressure of 8 mmHg. IAS/Shunts: The interatrial septum was not well visualized.  LEFT VENTRICLE PLAX 2D LVIDd:         4.70 cm  Diastology LVIDs:         4.10 cm  LV e' medial:    3.65 cm/s LV PW:         1.26 cm  LV E/e' medial:  10.1 LV IVS:        1.29 cm  LV e' lateral:   4.54 cm/s LVOT diam:     2.40 cm  LV E/e' lateral: 8.1 LV SV:         52 LV SV Index:   23 LVOT Area:     4.52 cm  RIGHT VENTRICLE RV S prime:     9.58 cm/s TAPSE (M-mode): 1.5 cm LEFT ATRIUM             Index       RIGHT ATRIUM           Index LA diam:        2.80 cm 1.27 cm/m  RA Area:     12.20 cm LA Vol (A2C):   28.3 ml 12.87 ml/m RA Volume:   29.80 ml  13.55 ml/m LA Vol (A4C):   24.5 ml 11.14 ml/m LA Biplane Vol: 29.0 ml 13.19 ml/m  AORTIC VALVE LVOT Vmax:   74.20 cm/s LVOT Vmean:  47.800 cm/s LVOT VTI:    0.114 m  AORTA Ao Root diam: 3.40 cm MITRAL VALVE MV Area (PHT): 3.16 cm    SHUNTS MV Decel Time: 240 msec    Systemic VTI:  0.11 m MV E velocity: 37.00 cm/s  Systemic Diam: 2.40 cm MV A velocity: 54.80 cm/s MV E/A ratio:  0.68 Cherlynn Kaiser MD Electronically signed by Cherlynn Kaiser MD Signature Date/Time: 07/23/2020/2:06:22 PM    Final     Labs:  CBC: Recent Labs    08/01/20 0039 08/03/20 0232 08/04/20 0301 08/05/20 0342  WBC 20.6* 22.9* 18.5* 16.7*  HGB 10.8* 10.4* 9.8* 8.8*  HCT 33.1* 32.2* 30.3* 28.8*  PLT 315 366 355 348    COAGS: No results for input(s): INR, APTT in the last 8760 hours.  BMP: Recent Labs    08/03/20 0232 08/04/20 0301 08/05/20 0342 08/06/20 0138  NA 146* 146* 141 142  K 4.6 4.9 4.9 4.9  CL 112* 112* 109 109  CO2 _0 GLUCOSE 127* 246* 237* 277*  BUN 63* 53* 49* 51*  CALCIUM 9.0 9.1 8.8* 8.8*  CREATININE 1.89* 1.74* 1.61* 1.67*  GFRNONAA 41* 45* 49* 47*    LIVER FUNCTION TESTS: Recent Labs     07/24/20 0425 07/25/20 0343 07/26/20 0526 07/27/20 0318  BILITOT 1.3* 1.4* 1.4* 1.7*  AST 93* 71* 66* 73*  ALT 64* 47* 43 52*  ALKPHOS 53 63 105 161*  PROT 5.5* 5.7* 5.9* 6.6  ALBUMIN 2.5* 2.4* 2.3* 2.6*    TUMOR MARKERS: No results for input(s): AFPTM, CEA, CA199, CHROMGRNA in the last 8760 hours.  Assessment and Plan:  STEMI; anoxic brain injury; dysphagia: Elsie Saas. 45, 59 year old male, is  tentatively scheduled for gastrostomy tube placement 08/17/20 at the [facility]. Telephone consent was obtained from the patient's son, Seraj Dunnam.   Risks and benefits of image-guided gastrostomy tube placement were discussed with the patient including, but not limited to the need for a barium enema during the procedure, bleeding, infection, peritonitis and/or damage to adjacent structures.  All of the patient's questions were answered, patient is agreeable to proceed.  The patient will be NPO/Hold Tube feeds after midnight 08/17/20. AM labs have been ordered for that day. He may continue to receive Brilinta.  Consent signed and in the IR control room.  Thank you for this interesting consult.  I greatly enjoyed meeting Antonio Navarro and look forward to participating in their care.  A copy of this report was sent to the requesting provider on this date.  Electronically Signed: Soyla Dryer, AGACNP-BC [phone] 08/15/2020, 1:54 PM   I spent a total of 20 Minutes    in face to face in clinical consultation, greater than 50% of which was counseling/coordinating care for gastrostomy tube placement.

## 2020-08-15 NOTE — Progress Notes (Signed)
CSW received call back from [employer], Artist. CSW is informed that pt's fiance will complete medicaid application on Wednesday 12/29 and that financial counselor will pick up application on Thursday 08/17/20

## 2020-08-15 NOTE — Progress Notes (Signed)
PROGRESS NOTE    Antonio Navarro   YIR:[id number]  DOB: January 29, 1961  DOA: 07/22/2020 PCP: Patient, No Pcp Per   Brief Narrative:  Antonio Navarro is an 59 y.o. male past medical history of essential hypertension chronic back pain who was admitted for cardiac arrest with downtime of 9 minutes out of hospital initial rhythm was V. fib receive shock and 6 rounds of epi before ROSC was achieved he was found to have a STEMI he status post cath with a drug-eluting stent to the circumflex currently on Brilinta and aspirin along with beta-blockers and statins.  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 - unable to speak & swallow - although he moves his extremities at times (per RN), he has no purposeful movements of his arms or legs and does not follow commands- he does seem to answer some questions with either shaking or nodding his head but this is inconsistent - multiple palliative care discussions have been had - his son would like to pursue PEG placement -   IR is unable to do this while he is still on [place] - cardiology does feel we can stop the Brillinta and switch him to a 2b3a inhibitor in order to do the PEG- IR is in agreement with this plan- I have discussed the plan with pharmacy today   Time spent in minutes: 35 DVT prophylaxis: heparin injection 5,000 Units Start: 07/22/20 2200 SCDs Start: 07/22/20 0559    Code Status: Full code Family Communication: son, [name] Disposition Plan:  Status is: Inpatient  Remains inpatient  appropriate because:IV treatments appropriate due to intensity of illness or inability to take PO   Dispo: The patient is from: Home              Anticipated d/c is to: SNF              Anticipated d/c date is: > 3 days              Patient currently is not medically stable to d/c.    Antimicrobials:  Anti-infectives (From admission, onward)   Start     Dose/Rate Route Frequency Ordered Stop   08/17/20 0000  ceFAZolin (ANCEF) IVPB 2g/100 mL premix        2 g 200 mL/hr over 30 Minutes Intravenous To Radiology 08/15/20 1346 08/18/20 0000   07/22/20 0830  cefTRIAXone (ROCEPHIN) 2 g in sodium chloride 0.9 % 100 mL IVPB        2 g 200 mL/hr over 30 Minutes Intravenous Every 24 hours 07/22/20 0720 07/26/20 0858       Objective: Vitals:   08/15/20 0010 08/15/20 0315 08/15/20 0414 08/15/20 1115  BP:   139/84 126/85  Pulse:   80 80  Resp:   18 16  Temp:   98.7 F (37.1 C) 98.4 F (36.9 C)  TempSrc:    Oral  SpO2:   100% 100%  Weight: 90.1 kg 90.1 kg    Height:  Intake/Output Summary (Last 24 hours) at 08/15/2020 1429 Last data filed at 08/15/2020 1152 Gross per 24 hour  Intake 3240 ml  Output 2600 ml  Net 640 ml   Filed Weights   08/14/20 0209 08/15/20 0010 08/15/20 0315  Weight: 89.5 kg 90.1 kg 90.1 kg    Examination: General exam: Appears comfortable  HEENT: PERRLA, oral mucosa moist, no sclera icterus or thrush- cor trac present Respiratory system: Clear to auscultation. Respiratory effort normal. Cardiovascular system: S1 & S2 heard,  No murmurs  Gastrointestinal system: Abdomen soft, non-tender, nondistended. Normal bowel sounds   Central nervous system: non verbal, does not follow commands  Extremities: No cyanosis, clubbing or edema Skin: No rashes or ulcers   Data Reviewed: I have personally reviewed following labs and imaging studies  CBC: No results for input(s): WBC, NEUTROABS, HGB, HCT, MCV, PLT in the last 168 hours. Basic Metabolic Panel: No  results for input(s): NA, K, CL, CO2, GLUCOSE, BUN, CREATININE, CALCIUM, MG, PHOS in the last 168 hours. GFR: Estimated Creatinine Clearance: 52.9 mL/min (A) (by C-G formula based on SCr of 1.67 mg/dL (H)). Liver Function Tests: No results for input(s): AST, ALT, ALKPHOS, BILITOT, PROT, ALBUMIN in the last 168 hours. No results for input(s): LIPASE, AMYLASE in the last 168 hours. No results for input(s): AMMONIA in the last 168 hours. Coagulation Profile: No results for input(s): INR, PROTIME in the last 168 hours. Cardiac Enzymes: No results for input(s): CKTOTAL, CKMB, CKMBINDEX, TROPONINI in the last 168 hours. BNP (last 3 results) No results for input(s): PROBNP in the last 8760 hours. HbA1C: No results for input(s): HGBA1C in the last 72 hours. CBG: Recent Labs  Lab 08/14/20 2012 08/15/20 0004 08/15/20 0411 08/15/20 0739 08/15/20 1117  GLUCAP 154* 183* 184* 133* 179*   Lipid Profile: No results for input(s): CHOL, HDL, LDLCALC, TRIG, CHOLHDL, LDLDIRECT in the last 72 hours. Thyroid Function Tests: No results for input(s): TSH, T4TOTAL, FREET4, T3FREE, THYROIDAB in the last 72 hours. Anemia Panel: No results for input(s): VITAMINB12, FOLATE, FERRITIN, TIBC, IRON, RETICCTPCT in the last 72 hours. Urine analysis:    Component Value Date/Time   COLORURINE YELLOW 03/08/2019 1115   APPEARANCEUR CLEAR 03/08/2019 1115   LABSPEC 1.020 03/08/2019 1115   PHURINE 5.0 03/08/2019 1115   GLUCOSEU NEGATIVE 03/08/2019 1115   HGBUR NEGATIVE 03/08/2019 1115   BILIRUBINUR NEGATIVE 03/08/2019 1115   [place] 03/08/2019 1115   PROTEINUR NEGATIVE 03/08/2019 1115   NITRITE NEGATIVE 03/08/2019 1115   LEUKOCYTESUR SMALL (A) 03/08/2019 1115   Sepsis Labs: _0 (procalcitonin:4,lacticidven:4) )No results found for this or any previous visit (from the past 240 hour(s)).       Radiology Studies: No results found.    Scheduled Meds: . aspirin  81 mg Per Tube Daily  .  atorvastatin  80 mg Per Tube Daily  . bethanechol  10 mg Per Tube TID  . carvedilol  25 mg Per Tube BID WC  . chlorhexidine gluconate (MEDLINE KIT)  15 mL Mouth Rinse BID  . feeding supplement (PROSource TF)  45 mL Per Tube BID  . free water  200 mL Per Tube Q3H  . heparin  5,000 Units Subcutaneous Q8H  . insulin aspart  0-20 Units Subcutaneous Q4H  . insulin detemir  30 Units Subcutaneous BID  . isosorbide-hydrALAZINE  1 tablet Oral TID  . mouth rinse  15 mL Mouth Rinse BID  . polyvinyl alcohol  1 drop Both Eyes QID  . sodium chloride flush  3 mL Intravenous Q12H   Continuous Infusions: . sodium chloride Stopped (07/22/20 0955)  . [START ON 08/17/2020]  ceFAZolin (ANCEF) IV    . feeding supplement (JEVITY 1.5 CAL/FIBER) 1,000 mL (08/14/20 2130)     LOS: 24 days      Debbe Odea, MD [facility] Pager: [email or web address] 08/15/2020, 2:29 PM

## 2020-08-15 NOTE — TOC Progression Note (Signed)
Transition of Care [facility]) - Progression Note    Patient Details  Name: LORN BUTCHER MRN: [id number] Date of Birth: 07/11/1961  [facility]) CM/SW Contact  Erin Sons, [place] Phone Number: 08/15/2020, 10:23 AM  Clinical Narrative:     CSW received voicemail from financial counseling. Informed that Janey Greaser Revels from [employer] is pt's Artist. [phone]. Informed that a disability application was started and medicaid applications were given to son.   CSW called and left message with Lacretia Nicks       Expected Discharge Plan and Services                                                 Social Determinants of Health (SDOH) Interventions    Readmission Risk Interventions No flowsheet data found.

## 2020-08-16 LAB — GLUCOSE, CAPILLARY
Glucose-Capillary: 161 mg/dL — ABNORMAL HIGH (ref 70–99)
Glucose-Capillary: 165 mg/dL — ABNORMAL HIGH (ref 70–99)
Glucose-Capillary: 173 mg/dL — ABNORMAL HIGH (ref 70–99)
Glucose-Capillary: 187 mg/dL — ABNORMAL HIGH (ref 70–99)
Glucose-Capillary: 195 mg/dL — ABNORMAL HIGH (ref 70–99)
Glucose-Capillary: 209 mg/dL — ABNORMAL HIGH (ref 70–99)

## 2020-08-16 MED ORDER — SODIUM CHLORIDE 0.9 % IV SOLN
0.7500 ug/kg/min | INTRAVENOUS | Status: AC
  Administered 2020-08-16: 18:00:00 0.75 ug/kg/min via INTRAVENOUS
  Filled 2020-08-16: qty 50

## 2020-08-16 NOTE — Progress Notes (Signed)
[facility] PROGRESS NOTE    Progress Note  KARSON REEDE  YSA:[id number] DOB: 04-06-1961 DOA: 07/22/2020 PCP: Patient, No Pcp Per     Brief Narrative:   KEYMANI MCLEAN is an 59 y.o. male past medical history of essential hypertension chronic back pain who was admitted for cardiac arrest with downtime of 9 minutes out of hospital initial rhythm was V. fib receive shock and 6 rounds of epi before ROSC was achieved he was found to have a STEMI he status post cath with a drug-eluting stent to the circumflex currently on Brilinta and aspirin along with beta-blockers and statins.  Who was left with anoxic brain injury.  He was also in acute renal failure due to V. fib arrest and his creatinine has improved with IV fluid hydration. A 2D echo was done that showed an EF of 40%.on admission.He is currently on a beta-blocker and not on an ACE inhibitor due to renal failure.  Cardiology recommended to continue hydralazine and Isordil .  He Got high risk of aspiration and is currently n.p.o. with a core track.  Palliative care was consulted as his chances of meaningful recovery are nil.  Palliative care is working with the family to try to guide his treatment as they are unsure how to proceed.  He is uninsured so placement will be difficult.  Palliative care is currently working with the family to try to move towards comfort admission due to his poor prognosis.  Family meeting to be scheduled sometime in the middle of the week  Assessment/Plan:   Out of hospital V. fib due to acute ST elevation myocardial infarction (STEMI) ([place]) Post drug-eluting stent to the circumflex currently on aspirin and Brilinta.  Beta-Blockers and statins. Cardiology has signed off recommended to continue current management and to be notified for follow-up visit as an outpatient. Amiodarone was stopped.  Ischemic cardiomyopathy: With an EF of 40% by echo done on this admission. Continue beta-blocker no ACE inhibitor  due to renal disease. Continue hydralazine and isordil.  Acute respiratory failure with hypoxia: Now extubated speech evaluated the patient and deemed him very high risk for aspiration and they recommended n.p.o. Core track was placed. Satting greater 96% on 2 L.  Acute encephalopathy due to anoxic brain injury: Likely due to hypoxia in the setting of out of hospital V. fib arrest due to STEMI Core track in place continue tube feedings. Unable to speak son would like to pursue PEG tube placement, IR unable to place PEG tube due to [facility] cardiology does feel we can stop Brilinta and switch him to cangrelor that can be started this afternoon.  Acute on chronic kidney disease stage IIIb: Likely prerenal in the setting of V. Fib. With a baseline like around 1.6-2, continue free water per tube. Continue to follow basic metabolic panel intermittently.  Nonsustained SVT: Currently on amiodarone was added yesterday no episodes overnight continue Coreg, further management per cardiology.  Hypernatremia: Continue free water per tube.  Hyperglycemia: With an A1c of 4.7, he is currently on tube feedings we have him on Lantus plus sliding scale.  Goals of care: Legally the son is the main medical decision maker however patient fianc and sisters are working together to determine what is best for the patient. Palliative care was consulted and educated the patient on the medical situation and the significant of anoxic brain injury and compare best case scenario versus were case scenario. Decision regarding artificial feeding and benefits and complications of PEG tube  were discussed. Palliative care will continue to work with family they are unsure on how to proceed they have a family meeting and will have further information. Family will need ongoing conversation and support as they navigate through this difficult decision.  DVT prophylaxis: lovenox Family Communication:Son Status is:  Inpatient  Remains inpatient appropriate because:Hemodynamically unstable   Dispo: The patient is from: Home              Anticipated d/c is to: SNF              Anticipated d/c date is: > 3 days              Patient currently is not medically stable to d/c. Code Status:     Code Status Orders  (From admission, onward)         Start     Ordered   07/22/20 0719  Full code  Continuous        07/22/20 0719        Code Status History    Date Active Date Inactive Code Status Order ID Comments User Context   07/22/2020 0559 07/22/2020 0719 Full Code [id number]  Shellia Cleverly, MD Inpatient   Advance Care Planning Activity        IV Access:    Peripheral IV   Procedures and diagnostic studies:   No results found.   Medical Consultants:    None.  Anti-Infectives:   none  Subjective:    Elsie Saas Welby nonverbal.  Objective:    Vitals:   08/15/20 2012 08/16/20 0036 08/16/20 0414 08/16/20 0833  BP: 114/82  121/88 124/85  Pulse: 88  79 77  Resp: 20  18   Temp: 98.2 F (36.8 C)  (!) 97.3 F (36.3 C)   TempSrc: Oral  Oral   SpO2: 100%  99%   Weight:  90.4 kg    Height:       SpO2: 99 % O2 Flow Rate (L/min): 4 L/min FiO2 (%): 36 %   Intake/Output Summary (Last 24 hours) at 08/16/2020 1113 Last data filed at 08/16/2020 0935 Gross per 24 hour  Intake 223 ml  Output 2550 ml  Net -2327 ml   Filed Weights   08/15/20 0010 08/15/20 0315 08/16/20 0036  Weight: 90.1 kg 90.1 kg 90.4 kg    Exam: General exam: In no acute distress. Respiratory system: Good air movement and clear to auscultation. Cardiovascular system: S1 & S2 heard, RRR. No JVD. Gastrointestinal system: Abdomen is nondistended, soft and nontender.  Extremities: No pedal edema. Skin: No rashes, lesions or ulcers  Data Reviewed:    Labs: Basic Metabolic Panel: No results for input(s): NA, K, CL, CO2, GLUCOSE, BUN, CREATININE, CALCIUM, MG, PHOS in the last 168  hours. GFR Estimated Creatinine Clearance: 52.9 mL/min (A) (by C-G formula based on SCr of 1.67 mg/dL (H)). Liver Function Tests: No results for input(s): AST, ALT, ALKPHOS, BILITOT, PROT, ALBUMIN in the last 168 hours. No results for input(s): LIPASE, AMYLASE in the last 168 hours. No results for input(s): AMMONIA in the last 168 hours. Coagulation profile No results for input(s): INR, PROTIME in the last 168 hours. COVID-19 Labs  No results for input(s): DDIMER, FERRITIN, LDH, CRP in the last 72 hours.  Lab Results  Component Value Date   SARSCOV2NAA NEGATIVE 07/22/2020    CBC: No results for input(s): WBC, NEUTROABS, HGB, HCT, MCV, PLT in the last 168 hours. Cardiac Enzymes: No results for input(s): CKTOTAL,  CKMB, CKMBINDEX, TROPONINI in the last 168 hours. BNP (last 3 results) No results for input(s): PROBNP in the last 8760 hours. CBG: Recent Labs  Lab 08/15/20 1608 08/15/20 2009 08/16/20 0015 08/16/20 0411 08/16/20 0716  GLUCAP 178* 162* 187* 165* 161*   D-Dimer: No results for input(s): DDIMER in the last 72 hours. Hgb A1c: No results for input(s): HGBA1C in the last 72 hours. Lipid Profile: No results for input(s): CHOL, HDL, LDLCALC, TRIG, CHOLHDL, LDLDIRECT in the last 72 hours. Thyroid function studies: No results for input(s): TSH, T4TOTAL, T3FREE, THYROIDAB in the last 72 hours.  Invalid input(s): FREET3 Anemia work up: No results for input(s): VITAMINB12, FOLATE, FERRITIN, TIBC, IRON, RETICCTPCT in the last 72 hours. Sepsis Labs: No results for input(s): PROCALCITON, WBC, LATICACIDVEN in the last 168 hours. Microbiology No results found for this or any previous visit (from the past 240 hour(s)).   Medications:   . aspirin  81 mg Per Tube Daily  . atorvastatin  80 mg Per Tube Daily  . bethanechol  10 mg Per Tube TID  . carvedilol  25 mg Per Tube BID WC  . chlorhexidine gluconate (MEDLINE KIT)  15 mL Mouth Rinse BID  . feeding supplement  (PROSource TF)  45 mL Per Tube BID  . free water  200 mL Per Tube Q3H  . heparin  5,000 Units Subcutaneous Q8H  . insulin aspart  0-20 Units Subcutaneous Q4H  . insulin detemir  30 Units Subcutaneous BID  . isosorbide-hydrALAZINE  1 tablet Oral TID  . mouth rinse  15 mL Mouth Rinse BID  . polyvinyl alcohol  1 drop Both Eyes QID  . sodium chloride flush  3 mL Intravenous Q12H   Continuous Infusions: . sodium chloride Stopped (07/22/20 0955)  . cangrelor 50 mg in NS 250 mL    . [START ON 08/17/2020]  ceFAZolin (ANCEF) IV    . feeding supplement (JEVITY 1.5 CAL/FIBER) 1,000 mL (08/15/20 1703)      LOS: 25 days   Charlynne Cousins  [facility]  08/16/2020, 11:13 AM

## 2020-08-16 NOTE — Progress Notes (Signed)
Progress Note  Patient Name: Antonio Navarro Date of Encounter: 08/16/2020  Primary Cardiologist:   Fransico Him, MD   Subjective   Non verbal.  Lying flat.  No distress.    Inpatient Medications    Scheduled Meds:  aspirin  81 mg Per Tube Daily   atorvastatin  80 mg Per Tube Daily   bethanechol  10 mg Per Tube TID   carvedilol  25 mg Per Tube BID WC   chlorhexidine gluconate (MEDLINE KIT)  15 mL Mouth Rinse BID   feeding supplement (PROSource TF)  45 mL Per Tube BID   free water  200 mL Per Tube Q3H   heparin  5,000 Units Subcutaneous Q8H   insulin aspart  0-20 Units Subcutaneous Q4H   insulin detemir  30 Units Subcutaneous BID   isosorbide-hydrALAZINE  1 tablet Oral TID   mouth rinse  15 mL Mouth Rinse BID   polyvinyl alcohol  1 drop Both Eyes QID   sodium chloride flush  3 mL Intravenous Q12H   Continuous Infusions:  sodium chloride Stopped (07/22/20 0955)   [START ON 08/17/2020]  ceFAZolin (ANCEF) IV     feeding supplement (JEVITY 1.5 CAL/FIBER) 1,000 mL (08/15/20 1703)   PRN Meds: acetaminophen, docusate, Gerhardt's butt cream, metoprolol tartrate, polyethylene glycol, sodium chloride flush   Vital Signs    Vitals:   08/15/20 1719 08/15/20 2012 08/16/20 0036 08/16/20 0414  BP: 99/63 114/82  121/88  Pulse: 83 88  79  Resp:  20  18  Temp:  98.2 F (36.8 C)  (!) 97.3 F (36.3 C)  TempSrc:  Oral  Oral  SpO2:  100%  99%  Weight:   90.4 kg   Height:        Intake/Output Summary (Last 24 hours) at 08/16/2020 0820 Last data filed at 08/16/2020 0414 Gross per 24 hour  Intake 123 ml  Output 2050 ml  Net -1927 ml   Filed Weights   08/15/20 0010 08/15/20 0315 08/16/20 0036  Weight: 90.1 kg 90.1 kg 90.4 kg    TELEMETRY    NSR - Personally Reviewed  Physical Exam   GEN: No  acute distress.   Cardiac: RRR, no murmurs, rubs, or gallops.  Respiratory: Clear   to auscultation bilaterally. GI: Soft, nontender, non-distended, normal  bowel sounds  MS:  No edema; No deformity. Neuro:   Nonfocal .  Moves all extremities Psych:   Unable to assess as the patient is non verbal.    Labs    ChemistryNo results for input(s): NA, K, CL, CO2, GLUCOSE, BUN, CREATININE, CALCIUM, PROT, ALBUMIN, AST, ALT, ALKPHOS, BILITOT, GFRNONAA, GFRAA, ANIONGAP in the last 168 hours.   HematologyNo results for input(s): WBC, RBC, HGB, HCT, MCV, MCH, MCHC, RDW, PLT in the last 168 hours.  Cardiac EnzymesNo results for input(s): TROPONINI in the last 168 hours. No results for input(s): TROPIPOC in the last 168 hours.   BNPNo results for input(s): BNP, PROBNP in the last 168 hours.   DDimer No results for input(s): DDIMER in the last 168 hours.   Radiology    No results found.  Cardiac Studies   Cardiac cath  Diagnostic Dominance: Right    Intervention       Patient Profile     59 y.o. male with history of tobacco abuse, obesity, HTN who was admitted 07/22/20 post cardiac arrest with inferolateral STEMI. He was resuscitated in the field by EMS after being found to be in V fib.  Approximately 30 minutes of CPR. He was intubated on arrival. 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 No other obstructive disease. LvEF=40-45% by echo 07/23/20.   Assessment & Plan    CARDIAC ARREST:   For PEG placement on Thursday.  He had his last dose of Brilinta on the evening of the 27th.  The plan is to bridge with cangrelor.  He can start that this afternoon.  I spoke with the pharmacy to arrange this.  We need to be sure that he is OK to restart the Brilinta immediately after the PEG which would be my suggestion.  However, we need the OK from interventional radiology.   ISCHEMIC CARDIOMYOPATHY:  Changed to BiDil for ease of administration.     For questions or updates, please contact [place] Please consult [email or web address] for contact info under Cardiology/STEMI.   Signed, Minus Breeding, MD   08/16/2020, 8:20 AM

## 2020-08-16 NOTE — NC FL2 (Signed)
[place] LEVEL OF CARE SCREENING TOOL     IDENTIFICATION  Patient Name: Antonio Navarro Birthdate: Mar 08, 1961 Sex: male Admission Date (Current Location): 07/22/2020  [facility] and [place] Number:  Herbalist and Address:         Provider Number: [id number]  Attending Physician Name and Address:  Charlynne Cousins, MD  Relative Name and Phone Number:  Josel, Keo (Son)   [phone] [facility])    Current Level of Care: Hospital Recommended Level of Care: [place] Prior Approval Number:    Date Approved/Denied:   PASRR Number: [id number]  Discharge Plan: SNF    Current Diagnoses: Patient Active Problem List   Diagnosis Date Noted  . History of ETT   . Encephalopathy   . Acute ST elevation myocardial infarction (STEMI) ([place])   . Cardiac arrest ([place])   . Elevated blood pressure reading with diagnosis of hypertension   . Shock circulatory ([place])     Orientation RESPIRATION BLADDER Height & Weight     Self  Normal External catheter Weight: 199 lb 4.7 oz (90.4 kg) Height:  6' (182.9 cm)  BEHAVIORAL SYMPTOMS/MOOD NEUROLOGICAL BOWEL NUTRITION STATUS      Incontinent Diet (see d/c summary. NG tube to be removed and PEG to be placed)  AMBULATORY STATUS COMMUNICATION OF NEEDS Skin   Extensive Assist Non-Verbally Other (Comment) (Moisture associated skin damage buttocks; skin tear, back, lateral, right, upper)                       Personal Care Assistance Level of Assistance  Bathing,Feeding,Dressing Bathing Assistance: Maximum assistance Feeding assistance: Maximum assistance Dressing Assistance: Maximum assistance     Functional Limitations Info  Sight,Hearing,Speech Sight Info: Impaired Hearing Info: Adequate Speech Info: Impaired    SPECIAL CARE FACTORS FREQUENCY  PT (By licensed PT),OT (By licensed OT),Speech therapy     PT Frequency: 5x/week OT Frequency: 5x/week     Speech Therapy Frequency: 5x/week       Contractures Contractures Info: Not present    Additional Factors Info  Code Status,Allergies Code Status Info: Full Allergies Info: No known allergies           Current Medications (08/16/2020):  This is the current hospital active medication list Current Facility-Administered Medications  Medication Dose Route Frequency Provider Last Rate Last Admin  . 0.9 %  sodium chloride infusion  250 mL Intravenous Continuous Lorretta Harp, MD   Held at 07/22/20 [phone]  . acetaminophen (TYLENOL) tablet 650 mg  650 mg Per Tube Q4H PRN Blenda Nicely, RPH   650 mg at 08/05/20 2132  . aspirin chewable tablet 81 mg  81 mg Per Tube Daily Blenda Nicely, RPH   81 mg at 08/15/20 1121  . atorvastatin (LIPITOR) tablet 80 mg  80 mg Per Tube Daily Blenda Nicely, RPH   80 mg at 08/15/20 1119  . bethanechol (URECHOLINE) tablet 10 mg  10 mg Per Tube TID Jacky Kindle, MD   10 mg at 08/15/20 2032  . carvedilol (COREG) tablet 25 mg  25 mg Per Tube BID WC Kipp Brood, MD   25 mg at 08/16/20 0833  . [START ON 08/17/2020] ceFAZolin (ANCEF) IVPB 2g/100 mL premix  2 g Intravenous to XRAY [place], Roselyn Reef R, NP      . chlorhexidine gluconate (MEDLINE KIT) (PERIDEX) 0.12 % solution 15 mL  15 mL Mouth Rinse BID Lorretta Harp, MD   15 mL  at 08/15/20 2033  . docusate (COLACE) 50 MG/5ML liquid 100 mg  100 mg Per Tube BID PRN Blenda Nicely, RPH      . feeding supplement (JEVITY 1.5 CAL/FIBER) liquid 1,000 mL  1,000 mL Per Tube Continuous Charlynne Cousins, MD 65 mL/hr at 08/15/20 1703 1,000 mL at 08/15/20 1703  . feeding supplement (PROSource TF) liquid 45 mL  45 mL Per Tube BID Jacky Kindle, MD   45 mL at 08/15/20 2032  . free water 200 mL  200 mL Per Tube Q3H Charlynne Cousins, MD   200 mL at 08/16/20 0532  . Gerhardt's butt cream   Topical PRN Charlynne Cousins, MD   Given at 08/12/20 1601  . heparin injection 5,000 Units  5,000 Units Subcutaneous Q8H Candee Furbish, MD   5,000 Units at  08/16/20 0447  . insulin aspart (novoLOG) injection 0-20 Units  0-20 Units Subcutaneous Q4H Stretch, Marily Lente, MD   4 Units at 08/16/20 [phone]  . insulin detemir (LEVEMIR) injection 30 Units  30 Units Subcutaneous BID Debbe Odea, MD   30 Units at 08/15/20 2032  . isosorbide-hydrALAZINE (BIDIL) 20-37.5 MG per tablet 1 tablet  1 tablet Oral TID Minus Breeding, MD   1 tablet at 08/15/20 2031  . MEDLINE mouth rinse  15 mL Mouth Rinse BID Jacky Kindle, MD   15 mL at 08/15/20 1120  . metoprolol tartrate (LOPRESSOR) injection 5 mg  5 mg Intravenous Q6H PRN Sherren Mocha, MD   5 mg at 08/01/20 3151  . polyethylene glycol (MIRALAX / GLYCOLAX) packet 17 g  17 g Per Tube Daily PRN Blenda Nicely, RPH   17 g at 07/29/20 0810  . polyvinyl alcohol (LIQUIFILM TEARS) 1.4 % ophthalmic solution 1 drop  1 drop Both Eyes QID Candee Furbish, MD   1 drop at 08/15/20 2033  . sodium chloride flush (NS) 0.9 % injection 3 mL  3 mL Intravenous Q12H Lorretta Harp, MD   3 mL at 08/15/20 2033  . sodium chloride flush (NS) 0.9 % injection 3 mL  3 mL Intravenous PRN Lorretta Harp, MD         Discharge Medications: Please see discharge summary for a list of discharge medications.  Relevant Imaging Results:  Relevant Lab Results:   Additional Information SSN [place], Brenda

## 2020-08-16 NOTE — Progress Notes (Signed)
Received call from [place] from [place]. She inquires about pt disability and medicaid applications. CSW provides update. [facility] is considering pt. She will continue to follow. More likely for [facility]

## 2020-08-16 NOTE — TOC Progression Note (Signed)
Transition of Care [facility]) - Progression Note    Patient Details  Name: Antonio Navarro MRN: [id number] Date of Birth: [birth year]  Transition of Care [facility]) CM/SW Contact  Erin Sons, [place] Phone Number: 08/16/2020, 9:16 AM  Clinical Narrative:     CSW called pt son Antonio Navarro to discuss disposition. Explained that CIR would not be an option. CSW discussed SNF process. Son is agreeable to SNF workup. CSW explained how no insurance/medicaid pending can be a barrier to SNF workup. Son confirmed that pt's s/o is completing medicaid application.   FL2 completed and bed requests sent in hub.   Expected Discharge Plan: Skilled Nursing Facility Barriers to Discharge: Continued Medical Work up,No SNF bed  Expected Discharge Plan and Services Expected Discharge Plan: Skilled Nursing Facility       Living arrangements for the past 2 months: Single Family Home                                       Social Determinants of Health (SDOH) Interventions    Readmission Risk Interventions No flowsheet data found.

## 2020-08-16 NOTE — Progress Notes (Signed)
ANTICOAGULATION CONSULT NOTE - Initial Consult  Pharmacy Consult for ticagrelor>>cangrelor  Indication: stent protection  No Known Allergies  Patient Measurements: Height: 6' (182.9 cm) Weight: 90.4 kg (199 lb 4.7 oz) IBW/kg (Calculated) : 77.6  Vital Signs: Temp: 97.3 F (36.3 C) (12/29 0414) Temp Source: Oral (12/29 0414) BP: 124/85 (12/29 0833) Pulse Rate: 77 (12/29 0833)  Labs: No results for input(s): HGB, HCT, PLT, APTT, LABPROT, INR, HEPARINUNFRC, HEPRLOWMOCWT, CREATININE, CKTOTAL, CKMB, TROPONINIHS in the last 72 hours.  Estimated Creatinine Clearance: 52.9 mL/min (A) (by C-G formula based on SCr of 1.67 mg/dL (H)).   Medical History: Past Medical History:  Diagnosis Date  . Hypertension   . Kidney stones    Assessment: 59 year old male post arrest and stent placement on 12/4. Patient has planned PEG placement in am 12/30 by IR. Decision was made to take him off his ticagrelor prior to PEG to lower his bleeding risk. Last dose of ticagrelor was 12/27pm. D/w cardiology today will start cangrelor bridge this evening and turn off prior to PEG. Cangrelor has a very short half life and should be cleared in 1 hour at the most, but will schedule stop time of 0400 in am to be safe. If PEG is pushed to tomorrow afternoon, then timing of medication stopping can be adjusted.   Goal of Therapy:  Monitor platelets by anticoagulation protocol: Yes   Plan:  Cangrelor 0.65mcg/kg/min to start this evening and off at 0400 12/30 prior to PEG Cards is planning on restarting ticagrelor post PEG  Sheppard Coil PharmD., BCPS Clinical Pharmacist 08/16/2020 11:14 AM

## 2020-08-16 NOTE — Progress Notes (Signed)
Occupational Therapy Evaluation Patient Details Name: Antonio Navarro MRN: [id number] DOB: April 04, 1961 Today's Date: 08/16/2020    History of Present Illness 59 y.o. male with history of tobacco abuse, obesity, HTN who was admitted 07/22/20 post cardiac arrest with inferolateral STEMI. He was resuscitated in the field by EMS after being found to be in V fib. Approximately 30 minutes of CPR. He was intubated on arrival.   Clinical Impression   PTA pt PLOF living at home with fiance, employed and highly active. I with all ADLs and IADLs. Pt currently limited with safe ADL engagement due to limitations in coordination, cognition, strength, and activity tolerance. Increased alertness this session, verbalizing with some words and continues to nod head for responses. Pt required  Max A +2 with supine to sit transition Mod A +2 for stability in sitting to prepare for ADL tasks. Pt will benefit from additional acute OT to address established deficits to maximize independence prior to dc setting.     Follow Up Recommendations  SNF    Equipment Recommendations       Recommendations for Other Services       Precautions / Restrictions Precautions Precautions: Fall;Other (comment) Precaution Comments: Cortrak, mitts      Mobility Bed Mobility Overal bed mobility: Needs Assistance Bed Mobility: Supine to Sit;Sit to Supine;Rolling Rolling: Max assist   Supine to sit: Max assist;+2 for physical assistance Sit to supine: Max assist;+2 for physical assistance   General bed mobility comments: left with maxA, decreased initiation this session. MaxA + 2 for supine <> sit, assistance to present BLE to EOB and trunk elevation.    Transfers                 General transfer comment: deferred    Balance Overall balance assessment: Needs assistance Sitting-balance support: Feet supported Sitting balance-Leahy Scale: Poor Sitting balance - Comments: requiring min-max assist, decreased trunk  control and sitting stability. Pt observed to attempted to scoot anteriorly by pushing with BUE  (mod A+2 for safety)                                   ADL either performed or assessed with clinical judgement   ADL Overall ADL's : Needs assistance/impaired Eating/Feeding: NPO Eating/Feeding Details (indicate cue type and reason): tube feeding Grooming: Maximal assistance   Upper Body Bathing: Maximal assistance   Lower Body Bathing: Maximal assistance   Upper Body Dressing : Maximal assistance   Lower Body Dressing: Total assistance     Toilet Transfer Details (indicate cue type and reason): deferred fro safety Toileting- Clothing Manipulation and Hygiene: Total assistance Toileting - Clothing Manipulation Details (indicate cue type and reason): catheter       General ADL Comments: Limitation in coordination and cognition to perform self care task such as dressing. Heavy cueing with multimodal cues.     Vision         Perception     Praxis      Pertinent Vitals/Pain Pain Assessment: Faces Faces Pain Scale: Hurts a little bit Pain Location: bottom with peri care Pain Descriptors / Indicators: Other (Comment) (restless) Pain Intervention(s): Monitored during session     Hand Dominance Left   Extremity/Trunk Assessment Upper Extremity Assessment Upper Extremity Assessment: Generalized weakness RUE Deficits / Details: Shoulder ROM WFL LUE Deficits / Details: Shoulder ROM WFL   Lower Extremity Assessment Lower Extremity Assessment: Defer to PT evaluation  RLE Deficits / Details: Hip/knee/ankle ROM WFL LLE Deficits / Details: Hip/knee/ankle ROM WFL       Communication Communication Communication: Other (comment) (nonverbal during session)   Cognition Arousal/Alertness: Awake/alert Behavior During Therapy: Restless Overall Cognitive Status: Impaired/Different from baseline Area of Impairment: Attention;Following  commands;Safety/judgement;Awareness;Rancho level               Rancho Levels of Cognitive Functioning Ranch[place] Amigos Scales of Cognitive Functioning: Confused/agitated   Current Attention Level: Focused   Following Commands: Follows one step commands inconsistently Safety/Judgement: Decreased awareness of safety;Decreased awareness of deficits Awareness: Intellectual   General Comments: Pt following ~25% of 1 step commands with repetition, visually tracking, nodding head "yes," to one question. Improved alertness this session.   General Comments  incoordination and ataxia observed of UE when assessing ROM and strength.    Exercises     Shoulder Instructions      Home Living Family/patient expects to be discharged to:: Private residence Living Arrangements: Spouse/significant other (fiance) Available Help at Discharge: Family Type of Home: House Home Access: Stairs to enter [employer] of Steps: 2 (back entrance; 4 at front entrance)         Bathroom Shower/Tub: Chief Strategy Officer: Standard         Additional Comments: Fiance at bedside reports pt was I with ADLs, and works in [employer].      Prior Functioning/Environment Level of Independence: Independent        Comments: Works in Programmer, systems Problem List: Decreased strength;Impaired balance (sitting and/or standing);Decreased coordination;Decreased cognition;Decreased safety awareness;Decreased knowledge of use of DME or AE      OT Treatment/Interventions: Self-care/ADL training;Therapeutic exercise;Neuromuscular education;DME and/or AE instruction;Therapeutic activities;Cognitive remediation/compensation;Patient/family education;Balance training    OT Goals(Current goals can be found in the care plan section) Acute Rehab OT Goals Patient Stated Goal: pt fiance would like him to get stronger OT Goal Formulation: With family Time For Goal  Achievement: 08/30/20 Potential to Achieve Goals: Fair  OT Frequency: Min 2X/week   Barriers to D/C:            Co-evaluation              AM-PAC OT "6 Clicks" Daily Activity     Outcome Measure Help from another person eating meals?: Total Help from another person taking care of personal grooming?: A Lot Help from another person toileting, which includes using toliet, bedpan, or urinal?: Total Help from another person bathing (including washing, rinsing, drying)?: A Lot Help from another person to put on and taking off regular upper body clothing?: A Lot Help from another person to put on and taking off regular lower body clothing?: A Lot 6 Click Score: 10   End of Session Nurse Communication: Mobility status  Activity Tolerance: Patient limited by fatigue Patient left: in bed;with call bell/phone within reach;with nursing/sitter in room;with family/visitor present  OT Visit Diagnosis: Muscle weakness (generalized) (M62.81);Pain;Other abnormalities of gait and mobility (R26.89);Cognitive communication deficit (R41.841);Low vision, both eyes (H54.2) Symptoms and signs involving cognitive functions: Other Nontraumatic ICH                Time: 7902-4097 OT Time Calculation (min): 16 min Charges:  OT General Charges $OT Visit: 1 Visit OT Evaluation $OT Eval Moderate Complexity: 1 Mod  Marquette Old, MSOT, OTR/L  Supplemental Rehabilitation Services  [phone]   Antonio Navarro 08/16/2020, 2:16 PM

## 2020-08-17 ENCOUNTER — Inpatient Hospital Stay (HOSPITAL_COMMUNITY): Payer: Medicaid Other

## 2020-08-17 HISTORY — PX: IR GASTROSTOMY TUBE MOD SED: IMG625

## 2020-08-17 LAB — GLUCOSE, CAPILLARY
Glucose-Capillary: 115 mg/dL — ABNORMAL HIGH (ref 70–99)
Glucose-Capillary: 138 mg/dL — ABNORMAL HIGH (ref 70–99)
Glucose-Capillary: 151 mg/dL — ABNORMAL HIGH (ref 70–99)
Glucose-Capillary: 183 mg/dL — ABNORMAL HIGH (ref 70–99)
Glucose-Capillary: 92 mg/dL (ref 70–99)
Glucose-Capillary: 97 mg/dL (ref 70–99)

## 2020-08-17 LAB — CBC
HCT: 30.1 % — ABNORMAL LOW (ref 39.0–52.0)
Hemoglobin: 9.2 g/dL — ABNORMAL LOW (ref 13.0–17.0)
MCH: 33.2 pg (ref 26.0–34.0)
MCHC: 30.6 g/dL (ref 30.0–36.0)
MCV: 108.7 fL — ABNORMAL HIGH (ref 80.0–100.0)
Platelets: 209 10*3/uL (ref 150–400)
RBC: 2.77 MIL/uL — ABNORMAL LOW (ref 4.22–5.81)
RDW: 15.4 % (ref 11.5–15.5)
WBC: 7.6 10*3/uL (ref 4.0–10.5)
nRBC: 0 % (ref 0.0–0.2)

## 2020-08-17 LAB — PROTIME-INR
INR: 1.2 (ref 0.8–1.2)
Prothrombin Time: 14.7 seconds (ref 11.4–15.2)

## 2020-08-17 IMAGING — XA IR PERC PLACEMENT GASTROSTOMY
1 series · 2 of 2 positions shown · non-contrast
Comparison: none

INDICATION: 59-year-old male with dysphagia and protein calorie malnutrition. He
requires percutaneous gastric access for nutrition.

[Series 1: fl (-) angio · 2 of 2 slices shown]
[im 1/2]
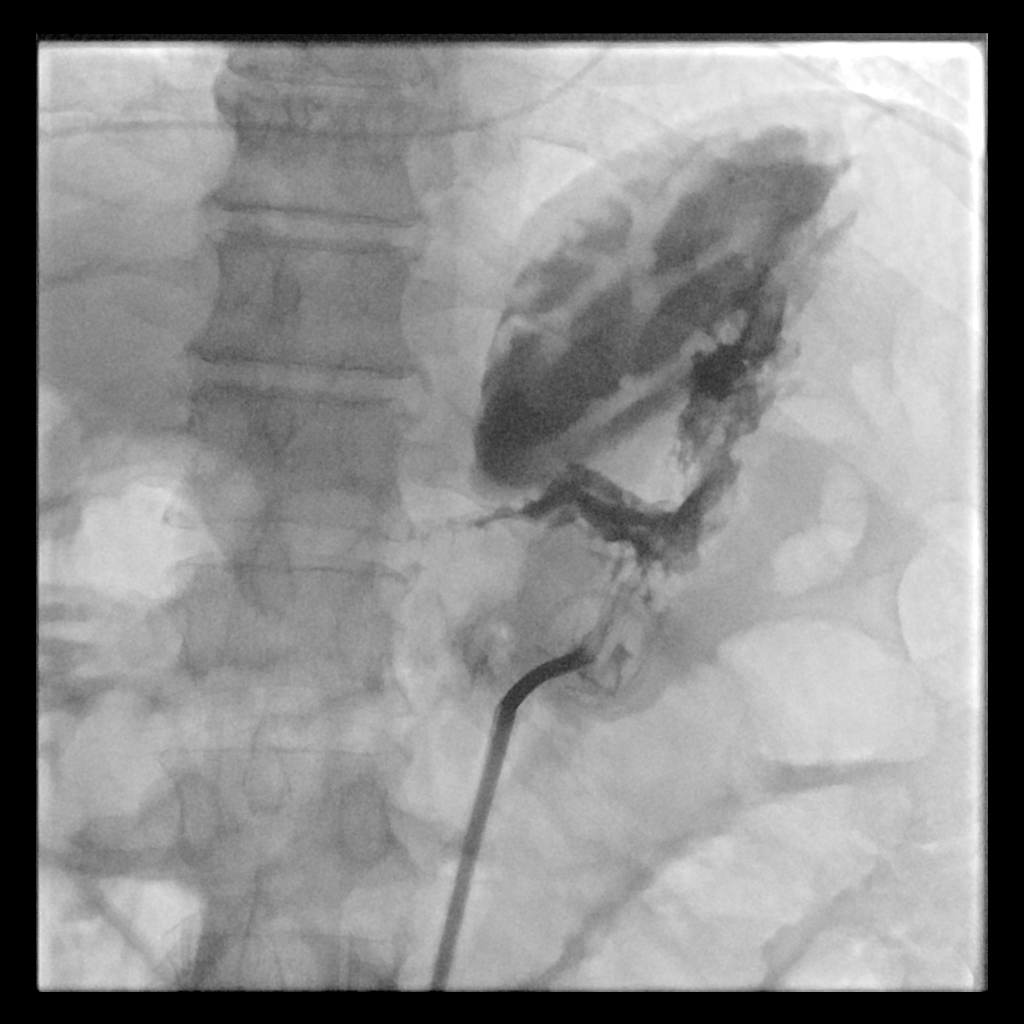
[im 2/2]
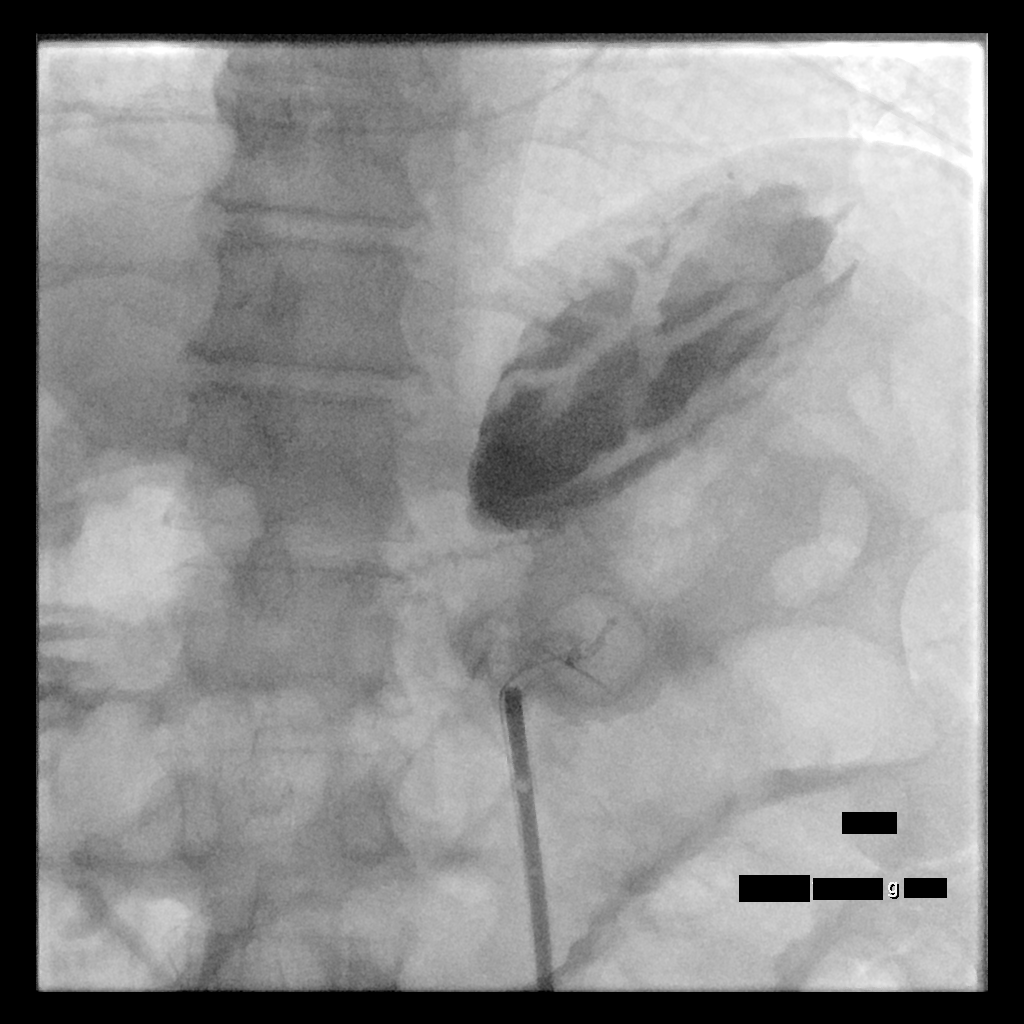

[2 of 2 positions shown; findings below may reference images not displayed]

EXAM:
Fluoroscopically guided placement of percutaneous pull-through
gastrostomy tube

MEDICATIONS:
2 g Ancef; 1 mg glucagon. Antibiotics were administered within 1
hour of the procedure.

ANESTHESIA/SEDATION:
Versed 1.5 mg IV; Fentanyl 50 mcg IV

Moderate Sedation Time:  38 minutes

The patient was continuously monitored during the procedure by the
interventional radiology nurse under my direct supervision.

CONTRAST:  15mL OMNIPAQUE IOHEXOL 300 MG/ML  SOLN

FLUOROSCOPY TIME:  Fluoroscopy Time: 16 minutes 36 seconds (167
mGy).

COMPLICATIONS:
None immediate.

PROCEDURE:
Informed written consent was obtained from the patient after a
thorough discussion of the procedural risks, benefits and
alternatives. All questions were addressed. Maximal Sterile Barrier
Technique was utilized including caps, mask, sterile gowns, sterile
gloves, sterile drape, hand hygiene and skin antiseptic. A timeout
was performed prior to the initiation of the procedure.

Maximal barrier sterile technique utilized including caps, mask,
sterile gowns, sterile gloves, large sterile drape, hand hygiene,
and chlorhexadine skin prep.

An angled catheter was advanced over a wire under fluoroscopic
guidance through the nose, down the esophagus and into the body of
the stomach. The stomach was then insufflated with several 100 ml of
air. Fluoroscopy confirmed location of the gastric bubble, as well
as inferior displacement of the barium stained colon. Under direct
fluoroscopic guidance, a single T-tack was placed, and the anterior
gastric wall drawn up against the anterior abdominal wall.
Percutaneous access was then obtained into the mid gastric body with
an 18 gauge sheath needle. Aspiration of air, and injection of
contrast material under fluoroscopy confirmed needle placement.

An Amplatz wire was advanced in the gastric body and the access
needle exchanged for a 9-French vascular sheath. A snare device was
advanced through the vascular sheath and an Amplatz wire advanced
through the angled catheter. The Amplatz wire was successfully
snared and this was pulled up through the esophagus and out the
mouth. A 20-LEDVIN tube was then connected to
the snare and pulled through the mouth, down the esophagus, into the
stomach and out to the anterior abdominal wall. Hand injection of
contrast material confirmed intragastric location. The T-tack
retention suture was then cut. The pull through peg tube was then
secured with the external bumper and capped.

The patient will be observed for several hours with the newly placed
tube on low wall suction to evaluate for any post procedure
complication. The patient tolerated the procedure well, there is no
immediate complication.
IMPRESSION: Successful placement of a 20 French pull through gastrostomy tube.

## 2020-08-17 MED ORDER — INSULIN DETEMIR 100 UNIT/ML ~~LOC~~ SOLN
30.0000 [IU] | Freq: Two times a day (BID) | SUBCUTANEOUS | Status: DC
  Administered 2020-08-18 – 2020-09-01 (×29): 30 [IU] via SUBCUTANEOUS
  Filled 2020-08-17 (×30): qty 0.3

## 2020-08-17 MED ORDER — MIDAZOLAM HCL 2 MG/2ML IJ SOLN
INTRAMUSCULAR | Status: AC
  Filled 2020-08-17: qty 2

## 2020-08-17 MED ORDER — CEFAZOLIN SODIUM-DEXTROSE 2-4 GM/100ML-% IV SOLN
INTRAVENOUS | Status: AC
  Administered 2020-08-17: 11:00:00 2 g via INTRAVENOUS
  Filled 2020-08-17: qty 100

## 2020-08-17 MED ORDER — LIDOCAINE HCL 1 % IJ SOLN
INTRAMUSCULAR | Status: AC
  Filled 2020-08-17: qty 20

## 2020-08-17 MED ORDER — FENTANYL CITRATE (PF) 100 MCG/2ML IJ SOLN
INTRAMUSCULAR | Status: AC | PRN
  Administered 2020-08-17: 50 ug via INTRAVENOUS

## 2020-08-17 MED ORDER — IOHEXOL 300 MG/ML  SOLN
50.0000 mL | Freq: Once | INTRAMUSCULAR | Status: AC | PRN
  Administered 2020-08-17: 12:00:00 15 mL

## 2020-08-17 MED ORDER — HEPARIN SODIUM (PORCINE) 5000 UNIT/ML IJ SOLN
5000.0000 [IU] | Freq: Three times a day (TID) | INTRAMUSCULAR | Status: DC
  Administered 2020-08-18 – 2020-09-23 (×99): 5000 [IU] via SUBCUTANEOUS
  Filled 2020-08-17 (×98): qty 1

## 2020-08-17 MED ORDER — GLUCAGON HCL RDNA (DIAGNOSTIC) 1 MG IJ SOLR
INTRAMUSCULAR | Status: AC
  Filled 2020-08-17: qty 1

## 2020-08-17 MED ORDER — GLUCAGON HCL RDNA (DIAGNOSTIC) 1 MG IJ SOLR
INTRAMUSCULAR | Status: AC | PRN
  Administered 2020-08-17: 1 mg via INTRAVENOUS

## 2020-08-17 MED ORDER — TICAGRELOR 90 MG PO TABS
90.0000 mg | ORAL_TABLET | ORAL | Status: AC
  Administered 2020-08-17: 14:00:00 90 mg
  Filled 2020-08-17: qty 1

## 2020-08-17 MED ORDER — TICAGRELOR 90 MG PO TABS
90.0000 mg | ORAL_TABLET | Freq: Two times a day (BID) | ORAL | Status: DC
  Administered 2020-08-17 – 2020-09-20 (×68): 90 mg
  Filled 2020-08-17 (×68): qty 1

## 2020-08-17 MED ORDER — INSULIN ASPART 100 UNIT/ML ~~LOC~~ SOLN
0.0000 [IU] | SUBCUTANEOUS | Status: DC
  Administered 2020-08-18: 1 [IU] via SUBCUTANEOUS
  Administered 2020-08-18: 3 [IU] via SUBCUTANEOUS
  Administered 2020-08-18: 2 [IU] via SUBCUTANEOUS
  Administered 2020-08-18: 3 [IU] via SUBCUTANEOUS
  Administered 2020-08-19 (×3): 2 [IU] via SUBCUTANEOUS
  Administered 2020-08-19: 3 [IU] via SUBCUTANEOUS
  Administered 2020-08-19 – 2020-08-20 (×4): 2 [IU] via SUBCUTANEOUS
  Administered 2020-08-20: 1 [IU] via SUBCUTANEOUS
  Administered 2020-08-20 (×2): 2 [IU] via SUBCUTANEOUS
  Administered 2020-08-21: 1 [IU] via SUBCUTANEOUS
  Administered 2020-08-21: 2 [IU] via SUBCUTANEOUS
  Administered 2020-08-21: 1 [IU] via SUBCUTANEOUS
  Administered 2020-08-21: 2 [IU] via SUBCUTANEOUS
  Administered 2020-08-22 (×2): 1 [IU] via SUBCUTANEOUS
  Administered 2020-08-22: 2 [IU] via SUBCUTANEOUS
  Administered 2020-08-23 (×2): 1 [IU] via SUBCUTANEOUS
  Administered 2020-08-23 – 2020-08-24 (×2): 2 [IU] via SUBCUTANEOUS
  Administered 2020-08-24: 3 [IU] via SUBCUTANEOUS
  Administered 2020-08-24 (×3): 1 [IU] via SUBCUTANEOUS
  Administered 2020-08-25 (×3): 2 [IU] via SUBCUTANEOUS
  Administered 2020-08-26 (×2): 1 [IU] via SUBCUTANEOUS
  Administered 2020-08-26 – 2020-08-27 (×4): 2 [IU] via SUBCUTANEOUS
  Administered 2020-08-27 – 2020-08-28 (×5): 1 [IU] via SUBCUTANEOUS
  Administered 2020-08-28: 2 [IU] via SUBCUTANEOUS
  Administered 2020-08-28 – 2020-08-29 (×5): 1 [IU] via SUBCUTANEOUS
  Administered 2020-08-29 (×2): 2 [IU] via SUBCUTANEOUS
  Administered 2020-08-29 – 2020-08-30 (×5): 1 [IU] via SUBCUTANEOUS
  Administered 2020-08-30: 2 [IU] via SUBCUTANEOUS
  Administered 2020-08-31: 1 [IU] via SUBCUTANEOUS
  Administered 2020-08-31 (×2): 2 [IU] via SUBCUTANEOUS
  Administered 2020-08-31: 1 [IU] via SUBCUTANEOUS
  Administered 2020-08-31: 2 [IU] via SUBCUTANEOUS
  Administered 2020-09-01: 1 [IU] via SUBCUTANEOUS

## 2020-08-17 MED ORDER — FENTANYL CITRATE (PF) 100 MCG/2ML IJ SOLN
INTRAMUSCULAR | Status: AC
  Filled 2020-08-17: qty 2

## 2020-08-17 MED ORDER — MIDAZOLAM HCL 2 MG/2ML IJ SOLN
INTRAMUSCULAR | Status: AC | PRN
  Administered 2020-08-17: 0.5 mg via INTRAVENOUS
  Administered 2020-08-17: 1 mg via INTRAVENOUS

## 2020-08-17 NOTE — Progress Notes (Signed)
PT Cancellation Note  Patient Details Name: Antonio Navarro MRN: [id number] DOB: [birth year]   Cancelled Treatment:    Reason Eval/Treat Not Completed: Patient at procedure or test/unavailable. Patient had g tube placed this morning. Will follow up when appropriate.    Meggen Spaziani 08/17/2020, 12:23 PM

## 2020-08-17 NOTE — Progress Notes (Signed)
SLP Cancellation Note  Patient Details Name: Antonio Navarro MRN: [id number] DOB: May 30, 1961   Cancelled treatment:       Reason Eval/Treat Not Completed: Patient at procedure or test/unavailable   [name], [name] 08/17/2020, 11:10 AM

## 2020-08-17 NOTE — Progress Notes (Signed)
Progress Note  Patient Name: Antonio Navarro Date of Encounter: 08/17/2020  Primary Cardiologist:   Fransico Him, MD   Subjective   Non verbal but responds to simple commands. Now status post PEG.  No apparent complications.     Inpatient Medications    Scheduled Meds: . aspirin  81 mg Per Tube Daily  . atorvastatin  80 mg Per Tube Daily  . bethanechol  10 mg Per Tube TID  . carvedilol  25 mg Per Tube BID WC  . chlorhexidine gluconate (MEDLINE KIT)  15 mL Mouth Rinse BID  . feeding supplement (PROSource TF)  45 mL Per Tube BID  . free water  200 mL Per Tube Q3H  . [START ON 08/18/2020] heparin  5,000 Units Subcutaneous Q8H  . insulin aspart  0-20 Units Subcutaneous Q4H  . insulin detemir  30 Units Subcutaneous BID  . isosorbide-hydrALAZINE  1 tablet Oral TID  . lidocaine      . mouth rinse  15 mL Mouth Rinse BID  . polyvinyl alcohol  1 drop Both Eyes QID  . sodium chloride flush  3 mL Intravenous Q12H  . ticagrelor  90 mg Per Tube BID   Continuous Infusions: . sodium chloride 250 mL (08/17/20 0812)  . feeding supplement (JEVITY 1.5 CAL/FIBER) 1,000 mL (08/16/20 1158)   PRN Meds: acetaminophen, docusate, Gerhardt's butt cream, metoprolol tartrate, polyethylene glycol, sodium chloride flush   Vital Signs    Vitals:   08/17/20 1222 08/17/20 1300 08/17/20 1345 08/17/20 1420  BP: (!) 156/91 (!) 130/98 (!) 144/98 (!) 150/98  Pulse: 72 79 67 77  Resp: '18 18 17 18  ' Temp: 97.7 F (36.5 C) 98.3 F (36.8 C) 97.7 F (36.5 C) 97.6 F (36.4 C)  TempSrc: Oral Oral Axillary Axillary  SpO2: 97% 99% 100% 100%  Weight:      Height:        Intake/Output Summary (Last 24 hours) at 08/17/2020 1529 Last data filed at 08/17/2020 1351 Gross per 24 hour  Intake 1280 ml  Output 1025 ml  Net 255 ml   Filed Weights   08/15/20 0315 08/16/20 0036 08/17/20 0416  Weight: 90.1 kg 90.4 kg 91 kg    TELEMETRY    NSR - Personally Reviewed  Physical Exam   GEN: No  acute  distress.   Cardiac: RRR, no murmurs, rubs, or gallops.  Respiratory: Clear   to auscultation bilaterally. GI: Soft, nontender, non-distended, normal bowel sounds  MS:  No edema; No deformity. Neuro:   Nonfocal .  Moves all extremities Psych:   Unable to assess as the patient is non verbal.    Labs    ChemistryNo results for input(s): NA, K, CL, CO2, GLUCOSE, BUN, CREATININE, CALCIUM, PROT, ALBUMIN, AST, ALT, ALKPHOS, BILITOT, GFRNONAA, GFRAA, ANIONGAP in the last 168 hours.   Hematology Recent Labs  Lab 08/17/20 0255  WBC 7.6  RBC 2.77*  HGB 9.2*  HCT 30.1*  MCV 108.7*  MCH 33.2  MCHC 30.6  RDW 15.4  PLT 209    Cardiac EnzymesNo results for input(s): TROPONINI in the last 168 hours. No results for input(s): TROPIPOC in the last 168 hours.   BNPNo results for input(s): BNP, PROBNP in the last 168 hours.   DDimer No results for input(s): DDIMER in the last 168 hours.   Radiology    IR GASTROSTOMY TUBE MOD SED  Result Date: 08/17/2020 INDICATION: 59 year old male with dysphagia and protein calorie malnutrition. He requires percutaneous gastric access  for nutrition. EXAM: Fluoroscopically guided placement of percutaneous pull-through gastrostomy tube Interventional Radiologist:  Criselda Peaches, MD MEDICATIONS: 2 g Ancef; 1 mg glucagon. Antibiotics were administered within 1 hour of the procedure. ANESTHESIA/SEDATION: Versed 1.5 mg IV; Fentanyl 50 mcg IV Moderate Sedation Time:  38 minutes The patient was continuously monitored during the procedure by the interventional radiology nurse under my direct supervision. CONTRAST:  76m OMNIPAQUE IOHEXOL 300 MG/ML  SOLN FLUOROSCOPY TIME:  Fluoroscopy Time: 16 minutes 36 seconds (167 mGy). COMPLICATIONS: None immediate. PROCEDURE: Informed written consent was obtained from the patient after a thorough discussion of the procedural risks, benefits and alternatives. All questions were addressed. Maximal Sterile Barrier Technique was  utilized including caps, mask, sterile gowns, sterile gloves, sterile drape, hand hygiene and skin antiseptic. A timeout was performed prior to the initiation of the procedure. Maximal barrier sterile technique utilized including caps, mask, sterile gowns, sterile gloves, large sterile drape, hand hygiene, and chlorhexadine skin prep. An angled catheter was advanced over a wire under fluoroscopic guidance through the nose, down the esophagus and into the body of the stomach. The stomach was then insufflated with several 100 ml of air. Fluoroscopy confirmed location of the gastric bubble, as well as inferior displacement of the barium stained colon. Under direct fluoroscopic guidance, a single T-tack was placed, and the anterior gastric wall drawn up against the anterior abdominal wall. Percutaneous access was then obtained into the mid gastric body with an 18 gauge sheath needle. Aspiration of air, and injection of contrast material under fluoroscopy confirmed needle placement. An Amplatz wire was advanced in the gastric body and the access needle exchanged for a 9-French vascular sheath. A snare device was advanced through the vascular sheath and an Amplatz wire advanced through the angled catheter. The Amplatz wire was successfully snared and this was pulled up through the esophagus and out the mouth. A 20-French KAlinda DoomsMIC-PEG tube was then connected to the snare and pulled through the mouth, down the esophagus, into the stomach and out to the anterior abdominal wall. Hand injection of contrast material confirmed intragastric location. The T-tack retention suture was then cut. The pull through peg tube was then secured with the external bumper and capped. The patient will be observed for several hours with the newly placed tube on low wall suction to evaluate for any post procedure complication. The patient tolerated the procedure well, there is no immediate complication. IMPRESSION: Successful placement  of a 20 French pull through gastrostomy tube. Electronically Signed   By: HJacqulynn CadetM.D.   On: 08/17/2020 14:57    Cardiac Studies   Cardiac cath  Diagnostic Dominance: Right    Intervention       Patient Profile     59y.o. male with history of tobacco abuse, obesity, HTN who was admitted 07/22/20 post cardiac arrest with inferolateral STEMI. He was resuscitated in the field by EMS after being found to be in V fib. Approximately 30 minutes of CPR. He was intubated on arrival. 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 No other obstructive disease. LvEF=40-45% by echo 07/23/20.   Assessment & Plan    CARDIAC ARREST:   PEG placed.  Resume Brilinta today with two doses.  Off cangrelor.  Reviewed with pharamcy.    ISCHEMIC CARDIOMYOPATHY:  Continue current therapy.     For questions or updates, please contact CFranciscoPlease consult [email or web address] for contact info under Cardiology/STEMI.   Signed, JJeneen Rinks  Marcellis Frampton, MD  08/17/2020, 3:29 PM

## 2020-08-17 NOTE — Progress Notes (Signed)
Occupational Therapy Treatment Patient Details Name: Antonio Navarro MRN: [id number] DOB: [birth year] Today's Date: 08/17/2020    History of present illness 59 y.o. male with history of tobacco abuse, obesity, HTN who was admitted 07/22/20 post cardiac arrest with inferolateral STEMI. He was resuscitated in the field by EMS after being found to be in V fib. Approximately 30 minutes of CPR. He was intubated on arrival.   OT comments  Focus of session on bed mobility, sitting balance at EOB and following commands. Pt some communication with facial expressions and gestures. Unaware he had incontinence of bowel, assisted with pericare and change of linen. Pt requiring +2 total assist for bed level mobility and min guard (briefly) to mod assist for sitting balance. Continues to be appropriate for SNF level rehab.   Follow Up Recommendations  SNF    Equipment Recommendations  Wheelchair (measurements OT);Wheelchair cushion (measurements OT);Hospital bed;3 in 1 bedside commode    Recommendations for Other Services      Precautions / Restrictions Precautions Precautions: Fall Precaution Comments: mitts, received gtube today Restrictions Weight Bearing Restrictions: No       Mobility Bed Mobility Overal bed mobility: Needs Assistance Bed Mobility: Supine to Sit;Sit to Supine;Rolling Rolling: Total assist;+2 for physical assistance   Supine to sit: Total assist;+2 for physical assistance Sit to supine: Total assist;+2 for physical assistance   General bed mobility comments: assist for all aspects, does not initiate  Transfers                 General transfer comment: deferred    Balance Overall balance assessment: Needs assistance Sitting-balance support: Feet supported Sitting balance-Leahy Scale: Poor Sitting balance - Comments: patient with occasional fair sitting balance, but cannot maintain. Requires mod assist +2 for safety and to regain balance. Postural control:  Posterior lean;Right lateral lean;Left lateral lean                                 ADL either performed or assessed with clinical judgement   ADL                                         General ADL Comments: total assist for pericare at bed level, hand over hand to wipe saliva from mouth     Vision       Perception     Praxis      Cognition Arousal/Alertness: Awake/alert Behavior During Therapy: Flat affect;Restless Overall Cognitive Status: Impaired/Different from baseline Area of Impairment: Attention;Awareness;Following commands;Safety/judgement                   Current Attention Level: Focused   Following Commands: Follows one step commands inconsistently Safety/Judgement: Decreased awareness of safety;Decreased awareness of deficits Awareness: Intellectual   General Comments: Follows 5% verbal cues, not able to track visually this session. Will verbalize infrequently.        Exercises     Shoulder Instructions       General Comments      Pertinent Vitals/ Pain       Pain Assessment: Faces Faces Pain Scale: Hurts a little bit Pain Location: bottom with peri care Pain Descriptors / Indicators: Grimacing;Guarding;Moaning Pain Intervention(s): Monitored during session;Repositioned  Home Living  Prior Functioning/Environment              Frequency  Min 2X/week        Progress Toward Goals  OT Goals(current goals can now be found in the care plan section)  Progress towards OT goals: Progressing toward goals  Acute Rehab OT Goals Patient Stated Goal: pt fiance would like him to get stronger OT Goal Formulation: With family Time For Goal Achievement: 08/30/20 Potential to Achieve Goals: [place] Discharge plan remains appropriate    Co-evaluation    PT/OT/SLP Co-Evaluation/Treatment: Yes Reason for Co-Treatment: For patient/therapist  safety PT goals addressed during session: Mobility/safety with mobility;Balance OT goals addressed during session: ADL's and self-care;Strengthening/ROM      AM-PAC OT "6 Clicks" Daily Activity     Outcome Measure   Help from another person eating meals?: Total Help from another person taking care of personal grooming?: Total Help from another person toileting, which includes using toliet, bedpan, or urinal?: Total Help from another person bathing (including washing, rinsing, drying)?: Total Help from another person to put on and taking off regular upper body clothing?: Total Help from another person to put on and taking off regular lower body clothing?: Total 6 Click Score: 6    End of Session    OT Visit Diagnosis: Muscle weakness (generalized) (M62.81);Pain;Other abnormalities of gait and mobility (R26.89);Cognitive communication deficit (R41.841);Low vision, both eyes (H54.2)   Activity Tolerance Patient tolerated treatment well   Patient Left in bed;with call bell/phone within reach;with family/visitor present;with bed alarm set   Nurse Communication          Time: [id number] OT Time Calculation (min): 24 min  Charges: OT General Charges $OT Visit: 1 Visit OT Treatments $Therapeutic Activity: 8-22 mins  Nestor Lewandowsky, OTR/L Acute Rehabilitation Services Pager: [phone] Office: [phone] Malka So 08/17/2020, 3:48 PM

## 2020-08-17 NOTE — Progress Notes (Signed)
Physical Therapy Treatment Patient Details Name: LINKYN HENDLER MRN: [id number] DOB: 27-Apr-1961 Today's Date: 08/17/2020    History of Present Illness 59 y.o. male with history of tobacco abuse, obesity, HTN who was admitted 07/22/20 post cardiac arrest with inferolateral STEMI. He was resuscitated in the field by EMS after being found to be in V fib. Approximately 30 minutes of CPR. He was intubated on arrival.    PT Comments    Patient received in bed, alert. Will turn head at sounds/voices, but has difficulty maintaining attention. Patient requiring total assist +2 for all bed mobility at this time. Once seated on side of bed requires total assist for positioning in midline with feet supported. Patient has poor sitting balance, loses balance in all directions requiring +2 min/mod assist for safety and to correct balance. Patient will continue to benefit from skilled PT while here to improve functional mobility and independence.   Follow Up Recommendations  SNF;Supervision/Assistance - 24 hour     Equipment Recommendations  Other (comment) (TBD at next venue)    Recommendations for Other Services       Precautions / Restrictions Precautions Precautions: Fall Precaution Comments: mitts, received gtube today Restrictions Weight Bearing Restrictions: No    Mobility  Bed Mobility Overal bed mobility: Needs Assistance Bed Mobility: Supine to Sit;Sit to Supine;Rolling Rolling: Total assist;+2 for physical assistance   Supine to sit: Total assist;+2 for physical assistance Sit to supine: Total assist;+2 for physical assistance      Transfers                    Ambulation/Gait                 Stairs             Wheelchair Mobility    Modified Rankin (Stroke Patients Only)       Balance Overall balance assessment: Needs assistance Sitting-balance support: Feet supported Sitting balance-Leahy Scale: Poor Sitting balance - Comments: patient with  occasional fair sitting balance, but cannot maintain. Requires mod assist +2 for safety and to regain balance. Postural control: Posterior lean;Right lateral lean;Left lateral lean                                  Cognition Arousal/Alertness: Awake/alert Behavior During Therapy: Flat affect;Restless Overall Cognitive Status: Impaired/Different from baseline Area of Impairment: Attention;Awareness;Following commands;Safety/judgement                   Current Attention Level: Focused   Following Commands: Follows one step commands inconsistently Safety/Judgement: Decreased awareness of safety;Decreased awareness of deficits Awareness: Intellectual   General Comments: Follows 5% verbal cues, not able to track visually this session. Will verbalize infrequently.      Exercises      General Comments        Pertinent Vitals/Pain Pain Assessment: Faces Faces Pain Scale: Hurts a little bit Pain Location: bottom with peri care Pain Descriptors / Indicators: Grimacing;Guarding;Moaning Pain Intervention(s): Monitored during session;Repositioned    Home Living                      Prior Function            PT Goals (current goals can now be found in the care plan section) Acute Rehab PT Goals Patient Stated Goal: pt fiance would like him to get stronger PT Goal Formulation: With patient/family  Time For Goal Achievement: 08/29/20 Potential to Achieve Goals: Fair Progress towards PT goals: Not progressing toward goals - comment (continuing to require total assist +2 at this time)    Frequency    Min 3X/week      PT Plan Current plan remains appropriate    Co-evaluation PT/OT/SLP Co-Evaluation/Treatment: Yes Reason for Co-Treatment: For patient/therapist safety;To address functional/ADL transfers PT goals addressed during session: Mobility/safety with mobility;Balance        AM-PAC PT "6 Clicks" Mobility   Outcome Measure  Help needed  turning from your back to your side while in a flat bed without using bedrails?: Total Help needed moving from lying on your back to sitting on the side of a flat bed without using bedrails?: Total Help needed moving to and from a bed to a chair (including a wheelchair)?: Total Help needed standing up from a chair using your arms (e.g., wheelchair or bedside chair)?: Total Help needed to walk in hospital room?: Total Help needed climbing 3-5 steps with a railing? : Total 6 Click Score: 6    End of Session   Activity Tolerance: Patient tolerated treatment well Patient left: in bed;with call bell/phone within reach;with bed alarm set;with family/visitor present Nurse Communication: Mobility status PT Visit Diagnosis: Other abnormalities of gait and mobility (R26.89)     Time: 0981-1914 PT Time Calculation (min) (ACUTE ONLY): 24 min  Charges:  $Therapeutic Activity: 8-22 mins                     Adilson Grafton, PT, GCS 08/17/20,3:14 PM

## 2020-08-17 NOTE — Progress Notes (Signed)
Inpatient Rehabilitation Admissions Coordinator   We will sign off at this time. Please re consult if he becomes appropriate.  Ottie Glazier, RN, MSN Rehab Admissions Coordinator [phone] 08/17/2020 12:53 PM

## 2020-08-17 NOTE — Plan of Care (Signed)
°  Problem: Health Behavior/Discharge Planning: Goal: Ability to manage health-related needs will improve Outcome: Progressing   Problem: Clinical Measurements: Goal: Will remain free from infection Outcome: Progressing Goal: Respiratory complications will improve Outcome: Progressing Goal: Cardiovascular complication will be avoided Outcome: Progressing   Problem: Nutrition: Goal: Adequate nutrition will be maintained Outcome: Progressing   

## 2020-08-17 NOTE — Progress Notes (Signed)
[facility] PROGRESS NOTE    Progress Note  Antonio Navarro  [id number] DOB: [birth year] DOA: 07/22/2020 PCP: Patient, No Pcp Per     Brief Narrative:   Antonio Navarro is an 59 y.o. male past medical history of essential hypertension chronic back pain who was admitted for cardiac arrest with downtime of 9 minutes out of hospital initial rhythm was V. fib receive shock and 6 rounds of epi before ROSC was achieved he was found to have a STEMI he status post cath with a drug-eluting stent to the circumflex currently on Brilinta and aspirin along with beta-blockers and statins.  Who was left with anoxic brain injury.  He was also in acute renal failure due to V. fib arrest and his creatinine has improved with IV fluid hydration. A 2D echo was done that showed an EF of 40%.on admission.He is currently on a beta-blocker and not on an ACE inhibitor due to renal failure.  Cardiology recommended to continue hydralazine and Isordil .  He Got high risk of aspiration and is currently n.p.o. with a core track.  Palliative care was consulted as his chances of meaningful recovery are nil.  Palliative care is working with the family to try to guide his treatment as they are unsure how to proceed.  He is uninsured so placement will be difficult.  Palliative care is currently working with the family to try to move towards comfort admission due to his poor prognosis.  Family meeting to be scheduled sometime in the middle of the week  Assessment/Plan:   Out of hospital V. fib due to acute ST elevation myocardial infarction (STEMI) ([place]) Post drug-eluting stent to the circumflex currently on aspirin and Brilinta.  Beta-Blockers and statins. Cardiology has signed off recommended to continue current management and to be notified for follow-up visit as an outpatient. Amiodarone was stopped.  Ischemic cardiomyopathy: With an EF of 40% by echo done on this admission. Continue beta-blocker no ACE inhibitor  due to renal disease. Continue hydralazine and isordil.  Acute respiratory failure with hypoxia: Now extubated speech evaluated the patient and deemed him very high risk for aspiration and they recommended n.p.o. Core track was placed. Satting greater 96% on 2 L.  Anoxi brain injury: In the setting of out of hospital V. fib arrest due to STEMI Core track in place continue tube feedings. Brilinta on hold scheduled for PEG tube on 08/17/2020, on Cangrelor.  Acute on chronic kidney disease stage IIIb: Likely prerenal in the setting of V. Fib. With a baseline like around 1.6-2, continue free water per tube. Continue to follow basic metabolic panel intermittently.  Nonsustained SVT: Currently on amiodarone was added yesterday no episodes overnight continue Coreg, further management per cardiology.  Hypernatremia: Continue free water per tube.  Hyperglycemia: With an A1c of 4.7, he is currently on tube feedings we have him on Lantus plus sliding scale.  Goals of care: Legally the son is the main medical decision maker however patient fianc and sisters are working together to determine what is best for the patient. Palliative care was consulted and educated the patient on the medical situation and the significant of anoxic brain injury and compare best case scenario versus were case scenario. Decision regarding artificial feeding and benefits and complications of PEG tube were discussed. Palliative care will continue to work with family they are unsure on how to proceed they have a family meeting and will have further information. Family will need ongoing conversation and support as  they navigate through this difficult decision.  DVT prophylaxis: lovenox Family Communication:Son Status is: Inpatient  Remains inpatient appropriate because:Hemodynamically unstable   Dispo: The patient is from: Home              Anticipated d/c is to: SNF              Anticipated d/c date is: > 3  days              Patient currently is not medically stable to d/c. Code Status:     Code Status Orders  (From admission, onward)         Start     Ordered   07/22/20 0719  Full code  Continuous        07/22/20 0719        Code Status History    Date Active Date Inactive Code Status Order ID Comments User Context   07/22/2020 0559 07/22/2020 0719 Full Code [id number]  Shellia Cleverly, MD Inpatient   Advance Care Planning Activity        IV Access:    Peripheral IV   Procedures and diagnostic studies:   No results found.   Medical Consultants:    None.  Anti-Infectives:   none  Subjective:    Antonio Navarro nonverbal.  Objective:    Vitals:   08/16/20 1617 08/16/20 2002 08/17/20 0416 08/17/20 0757  BP: 129/86 105/74 (!) 133/95 (!) 126/93  Pulse: 82 79 77 74  Resp:  16 18   Temp:  98 F (36.7 C) 98 F (36.7 C)   TempSrc:      SpO2:      Weight:   91 kg   Height:       SpO2: 97 % O2 Flow Rate (L/min): 4 L/min FiO2 (%): 36 %   Intake/Output Summary (Last 24 hours) at 08/17/2020 0829 Last data filed at 08/17/2020 0656 Gross per 24 hour  Intake 1638 ml  Output 1100 ml  Net 538 ml   Filed Weights   08/15/20 0315 08/16/20 0036 08/17/20 0416  Weight: 90.1 kg 90.4 kg 91 kg    Exam: General exam: In no acute distress. Respiratory system: Good air movement and clear to auscultation. Cardiovascular system: S1 & S2 heard, RRR. No JVD. Gastrointestinal system: Abdomen is nondistended, soft and nontender.  Extremities: No pedal edema. Skin: No rashes, lesions or ulcers  Data Reviewed:    Labs: Basic Metabolic Panel: No results for input(s): NA, K, CL, CO2, GLUCOSE, BUN, CREATININE, CALCIUM, MG, PHOS in the last 168 hours. GFR Estimated Creatinine Clearance: 52.9 mL/min (A) (by C-G formula based on SCr of 1.67 mg/dL (H)). Liver Function Tests: No results for input(s): AST, ALT, ALKPHOS, BILITOT, PROT, ALBUMIN in the last 168 hours. No  results for input(s): LIPASE, AMYLASE in the last 168 hours. No results for input(s): AMMONIA in the last 168 hours. Coagulation profile Recent Labs  Lab 08/17/20 0255  INR 1.2   COVID-19 Labs  No results for input(s): DDIMER, FERRITIN, LDH, CRP in the last 72 hours.  Lab Results  Component Value Date   [place] NEGATIVE 07/22/2020    CBC: Recent Labs  Lab 08/17/20 0255  WBC 7.6  HGB 9.2*  HCT 30.1*  MCV 108.7*  PLT 209   Cardiac Enzymes: No results for input(s): CKTOTAL, CKMB, CKMBINDEX, TROPONINI in the last 168 hours. BNP (last 3 results) No results for input(s): PROBNP in the last 8760 hours. CBG: Recent Labs  Lab 08/16/20 1609 08/16/20 2002 08/17/20 0006 08/17/20 0418 08/17/20 0742  GLUCAP 209* 195* 138* 151* 92   D-Dimer: No results for input(s): DDIMER in the last 72 hours. Hgb A1c: No results for input(s): HGBA1C in the last 72 hours. Lipid Profile: No results for input(s): CHOL, HDL, LDLCALC, TRIG, CHOLHDL, LDLDIRECT in the last 72 hours. Thyroid function studies: No results for input(s): TSH, T4TOTAL, T3FREE, THYROIDAB in the last 72 hours.  Invalid input(s): FREET3 Anemia work up: No results for input(s): VITAMINB12, FOLATE, FERRITIN, TIBC, IRON, RETICCTPCT in the last 72 hours. Sepsis Labs: Recent Labs  Lab 08/17/20 0255  WBC 7.6   Microbiology No results found for this or any previous visit (from the past 240 hour(s)).   Medications:   . aspirin  81 mg Per Tube Daily  . atorvastatin  80 mg Per Tube Daily  . bethanechol  10 mg Per Tube TID  . carvedilol  25 mg Per Tube BID WC  . chlorhexidine gluconate (MEDLINE KIT)  15 mL Mouth Rinse BID  . feeding supplement (PROSource TF)  45 mL Per Tube BID  . free water  200 mL Per Tube Q3H  . heparin  5,000 Units Subcutaneous Q8H  . insulin aspart  0-20 Units Subcutaneous Q4H  . insulin detemir  30 Units Subcutaneous BID  . isosorbide-hydrALAZINE  1 tablet Oral TID  . mouth rinse  15 mL  Mouth Rinse BID  . polyvinyl alcohol  1 drop Both Eyes QID  . sodium chloride flush  3 mL Intravenous Q12H   Continuous Infusions: . sodium chloride 250 mL (08/17/20 0812)  .  ceFAZolin (ANCEF) IV    . feeding supplement (JEVITY 1.5 CAL/FIBER) 1,000 mL (08/16/20 1158)      LOS: 26 days   Charlynne Cousins  [facility]  08/17/2020, 8:29 AM

## 2020-08-17 NOTE — Procedures (Signed)
Interventional Radiology Procedure Note  Procedure: Placement of percutaneous 20F pull-through gastrostomy tube. Complications: None Recommendations: - NPO except for sips and chips remainder of today and overnight - Maintain G-tube to LWS until tomorrow morning  - May advance diet as tolerated and begin using tube tomorrow morning  Signed,  Artin Mceuen K. Tahiry Spicer, MD   

## 2020-08-18 DIAGNOSIS — I255 Ischemic cardiomyopathy: Secondary | ICD-10-CM

## 2020-08-18 LAB — GLUCOSE, CAPILLARY
Glucose-Capillary: 119 mg/dL — ABNORMAL HIGH (ref 70–99)
Glucose-Capillary: 141 mg/dL — ABNORMAL HIGH (ref 70–99)
Glucose-Capillary: 142 mg/dL — ABNORMAL HIGH (ref 70–99)
Glucose-Capillary: 165 mg/dL — ABNORMAL HIGH (ref 70–99)
Glucose-Capillary: 204 mg/dL — ABNORMAL HIGH (ref 70–99)
Glucose-Capillary: 222 mg/dL — ABNORMAL HIGH (ref 70–99)

## 2020-08-18 NOTE — Progress Notes (Signed)
[facility] PROGRESS NOTE  Antonio Navarro TMH:[id number] DOB: [birth year] DOA: 07/22/2020 PCP: Patient, No Pcp Per  Status: Remains inpatient appropriate because:Altered mental status and Unsafe d/c plan   Dispo: The patient is from: Home              Anticipated d/c is to: SNF              Anticipated d/c date is: > 3 days              Patient currently is medically stable to d/c.  Barriers to discharge: Needs long-term SNF.  Medicaid and disability pending.  Patient has been evaluated by [facility] but is not appropriate at this time for aggressive rehabilitative therapies.   Code Status: Full Family Communication: Attending physician spoke with son on 12/30 DVT prophylaxis: sq heparin Vaccination status: Completed both doses of Pfizer Covid vaccination on 12/14/2019  Foley catheter: No  HPI: 59 year old male history of hypertension prescribed Norvasc prior to admission, chronic back pain, prior renal calculi who experienced an out of hospital cardiac arrest with downtime of 9 minutes.  Initial rhythm was ventricular fibrillation.  He was defibrillated deceived 6 doses of epi before ROSC.  Subsequent work-up revealed STEMI.  He underwent cardiac catheterization with a DES placed to the circumflex.  Cardiology following and has placed patient on Brilinta and aspirin and is also being treated with beta-blocker and statins.  From a neurological standpoint he clinically has anoxic brain injury.  During this hospitalization he experienced AKI and was treated with fluids and correction of hypoperfusion from cardiac arrest.  Echo performed this admission revealed ischemic cardiomyopathy with an EF of 40%  In addition to above patient has been unable to pass swallowing evaluations and subsequently has undergone percutaneous PEG tube placement on 12/30.  Patient is awake and moving all extremities spontaneously but does not follow commands and does not verbalize palliative care has met with patient and  family and has emphasized poor prognosis and will continue to meet with the family during the hospitalization.  Current plan is to transition to SNF bed once available.  Expect need for lifelong 24/7 care for all ADLs.  Subjective: Awakens to voice and makes eye contact.  Does not attempt to verbalize and is not following any commands.  Does focus on this writer when spoken to but goes back to sleep quickly.  Objective: Vitals:   08/17/20 2023 08/18/20 0441  BP: (!) 146/92 121/84  Pulse: 75 88  Resp: 17 18  Temp: 98 F (36.7 C) 97.9 F (36.6 C)  SpO2: 100%     Intake/Output Summary (Last 24 hours) at 08/18/2020 1044 Last data filed at 08/18/2020 0300 Gross per 24 hour  Intake 130 ml  Output 425 ml  Net -295 ml   Filed Weights   08/16/20 0036 08/17/20 0416 08/18/20 0500  Weight: 90.4 kg 91 kg 88.6 kg    Exam:  Constitutional: NAD, calm, comfortable Respiratory: clear to auscultation bilaterally, room air, normal respiratory effort. No accessory muscle use.  Cardiovascular: Regular rate and maintaining sinus rhythm, no murmurs / rubs / gallops. No extremity edema. 2+ pedal pulses.  Abdomen: Soft, nondistended nontender with normoactive bowel sounds.  PEG tube now in place and abdomen and site unremarkable.  Okay to begin using for tube feedings per interventional radiology Musculoskeletal: no clubbing / cyanosis. No contractures. Normal muscle tone and no hypertonicity or spasticity observed.  Skin: no rashes, lesions, ulcers. No induration; bilateral Prevalon boots  applied to feet-does have skin tear to back see below regarding documentation Neurologic: CN 2-12 grossly intact. Sensation appears to be intact did awaken to tactile stimuli prior to anterior chest.  Moves all extremities x4 equally but unable to follow commands or strength unable to accurately assess but appears to be at least 3+-4/5 laterally in both upper and lower extremity.  May have a degree of receptive aphasia  but unable to accurately assess. Psychiatric: Opens eyes to voice and makes eye contact but unable to follow commands or verbalize though exam extremely limited   Assessment/Plan: Acute problems: Out of hospital cardiac arrest secondary to STEMI -ROSC and subsequent catheterization revealed disease in circumflex requiring DES -Continue beta-blocker, statin; Brilinta which had been held briefly for PEG tube resumed on 12/30 by cardiology  Ischemic cardiomyopathy -Echo this admission EF 40% -Continue pharmacotherapy as above -No ACE inhibitor secondary to underlying kidney disease -Continue hydralazine and Isordil -Likely will need follow-up echo in 3 months from initial echo  Significant anoxic brain injury secondary to cardiac arrest -Patient awake and moving all extremities but is nonverbal and requires tube feeding due to severe dysphagia -Anticipate will need lifelong 24/7 care at a skilled nursing facility -No hypertonicity or spasticity observed -No seizure activity since onset of anoxic brain injury  Hypertension -Was on Norvasc prior to admission -Continue cardiovascular meds as listed above  Nonsustained VT -Continue amiodarone and beta-blocker -Cardiology managing  Acute on chronic kidney disease stage IIIb -Suspect prerenal etiology in the setting of hypoperfusion from ventricular fibrillation arrest -Baseline creatinine between 1.6 and 2; creatinine on 12/19 was 1.67-will repeat in a.m. on 08/19/2020 -Continue free water per tube and intermittently follow labs  Dysphagia, moderate protein calorie malnutrition Nutrition Problem: Inadequate oral intake Etiology: inability to eat Signs/Symptoms: NPO status Interventions: Tube feeding,Prostat Estimated body mass index is 26.49 kg/m as calculated from the following:   Height as of this encounter: 6' (1.829 m).   Weight as of this encounter: 88.6 kg.  Skin tear right lateral back Wound / Incision (Open or Dehisced)  07/28/20 Skin tear Back Lateral;Right;Upper (Active)  Date First Assessed/Time First Assessed: 07/28/20 0300   Wound Type: Skin tear  Location: Back  Location Orientation: Lateral;Right;Upper  Present on Admission: No    Assessments 07/28/2020  7:49 PM 08/18/2020 10:00 AM  Dressing Type None Foam - Lift dressing to assess site every shift  Dressing Changed -- New  Dressing Status -- Clean;Dry;Intact  Drainage Amount None --  Treatment Cleansed --     No Linked orders to display     Wound / Incision (Open or Dehisced) 08/04/20 (MASD) Moisture Associated Skin Damage Buttocks Right;Left (Active)  Date First Assessed/Time First Assessed: 08/04/20 0800   Wound Type: (MASD) Moisture Associated Skin Damage  Location: Buttocks  Location Orientation: Right;Left    Assessments 08/04/2020  8:16 AM 08/18/2020 10:00 AM  Dressing Type Foam - Lift dressing to assess site every shift Eye shield  Dressing Changed New --  Dressing Status Clean;Dry;Intact Clean;Dry;Intact  Dressing Change Frequency PRN --  Treatment Cleansed --     No Linked orders to display    Other problems: Acute hypernatremia -Continue free water per 2  Hyperglycemia without an underlying diagnosis of diabetes mellitus -Likely related to acute stress -CBGs while on tube feedings controlled with sliding scale insulin and high-dose Levemir insulin BID CBGs well-controlled at this juncture -Consider change to Glucerna tube feeding Levemir dosage can be decreased to minimize risk for unintended hypoglycemia -Hemoglobin  A1c 4.7   Acute hypoxemic respiratory failure -Resolved -Stable on room air with O2 sats between 97 and 99%   Data Reviewed: Basic Metabolic Panel: No results for input(s): NA, K, CL, CO2, GLUCOSE, BUN, CREATININE, CALCIUM, MG, PHOS in the last 168 hours. Liver Function Tests: No results for input(s): AST, ALT, ALKPHOS, BILITOT, PROT, ALBUMIN in the last 168 hours. No results for input(s): LIPASE, AMYLASE in  the last 168 hours. No results for input(s): AMMONIA in the last 168 hours. CBC: Recent Labs  Lab 08/17/20 0255  WBC 7.6  HGB 9.2*  HCT 30.1*  MCV 108.7*  PLT 209   Cardiac Enzymes: No results for input(s): CKTOTAL, CKMB, CKMBINDEX, TROPONINI in the last 168 hours. BNP (last 3 results) No results for input(s): BNP in the last 8760 hours.  ProBNP (last 3 results) No results for input(s): PROBNP in the last 8760 hours.  CBG: Recent Labs  Lab 08/17/20 1651 08/17/20 2021 08/18/20 0027 08/18/20 0436 08/18/20 0809  GLUCAP 97 115* 141* 119* 142*    No results found for this or any previous visit (from the past 240 hour(s)).   Studies: IR GASTROSTOMY TUBE MOD SED  Result Date: 08/17/2020 INDICATION: 59 year old male with dysphagia and protein calorie malnutrition. He requires percutaneous gastric access for nutrition. EXAM: Fluoroscopically guided placement of percutaneous pull-through gastrostomy tube Interventional Radiologist:  Criselda Peaches, MD MEDICATIONS: 2 g Ancef; 1 mg glucagon. Antibiotics were administered within 1 hour of the procedure. ANESTHESIA/SEDATION: Versed 1.5 mg IV; Fentanyl 50 mcg IV Moderate Sedation Time:  38 minutes The patient was continuously monitored during the procedure by the interventional radiology nurse under my direct supervision. CONTRAST:  46m OMNIPAQUE IOHEXOL 300 MG/ML  SOLN FLUOROSCOPY TIME:  Fluoroscopy Time: 16 minutes 36 seconds (167 mGy). COMPLICATIONS: None immediate. PROCEDURE: Informed written consent was obtained from the patient after a thorough discussion of the procedural risks, benefits and alternatives. All questions were addressed. Maximal Sterile Barrier Technique was utilized including caps, mask, sterile gowns, sterile gloves, sterile drape, hand hygiene and skin antiseptic. A timeout was performed prior to the initiation of the procedure. Maximal barrier sterile technique utilized including caps, mask, sterile gowns, sterile  gloves, large sterile drape, hand hygiene, and chlorhexadine skin prep. An angled catheter was advanced over a wire under fluoroscopic guidance through the nose, down the esophagus and into the body of the stomach. The stomach was then insufflated with several 100 ml of air. Fluoroscopy confirmed location of the gastric bubble, as well as inferior displacement of the barium stained colon. Under direct fluoroscopic guidance, a single T-tack was placed, and the anterior gastric wall drawn up against the anterior abdominal wall. Percutaneous access was then obtained into the mid gastric body with an 18 gauge sheath needle. Aspiration of air, and injection of contrast material under fluoroscopy confirmed needle placement. An Amplatz wire was advanced in the gastric body and the access needle exchanged for a 9-French vascular sheath. A snare device was advanced through the vascular sheath and an Amplatz wire advanced through the angled catheter. The Amplatz wire was successfully snared and this was pulled up through the esophagus and out the mouth. A 20-French KAlinda DoomsMIC-PEG tube was then connected to the snare and pulled through the mouth, down the esophagus, into the stomach and out to the anterior abdominal wall. Hand injection of contrast material confirmed intragastric location. The T-tack retention suture was then cut. The pull through peg tube was then secured with the external bumper  and capped. The patient will be observed for several hours with the newly placed tube on low wall suction to evaluate for any post procedure complication. The patient tolerated the procedure well, there is no immediate complication. IMPRESSION: Successful placement of a 20 French pull through gastrostomy tube. Electronically Signed   By: Jacqulynn Cadet M.D.   On: 08/17/2020 14:57    Scheduled Meds: . aspirin  81 mg Per Tube Daily  . atorvastatin  80 mg Per Tube Daily  . bethanechol  10 mg Per Tube TID  . carvedilol   25 mg Per Tube BID WC  . chlorhexidine gluconate (MEDLINE KIT)  15 mL Mouth Rinse BID  . feeding supplement (PROSource TF)  45 mL Per Tube BID  . free water  200 mL Per Tube Q3H  . heparin  5,000 Units Subcutaneous Q8H  . insulin aspart  0-9 Units Subcutaneous Q4H  . insulin detemir  30 Units Subcutaneous BID  . isosorbide-hydrALAZINE  1 tablet Oral TID  . mouth rinse  15 mL Mouth Rinse BID  . polyvinyl alcohol  1 drop Both Eyes QID  . sodium chloride flush  3 mL Intravenous Q12H  . ticagrelor  90 mg Per Tube BID   Continuous Infusions: . sodium chloride 250 mL (08/17/20 0812)  . feeding supplement (JEVITY 1.5 CAL/FIBER) 65 mL/hr at 08/18/20 3338    Active Problems:   Acute ST elevation myocardial infarction (STEMI) [facility])   Cardiac arrest (HCC)   Elevated blood pressure reading with diagnosis of hypertension   Shock circulatory ([place])   Encephalopathy   History of ETT   Consultants:  Cardiology  Interventional radiology  PCCM  Neurology  Procedures:  Cardiac catheterization 12/4  Continuous EEG 12/4 and overnight EEG with video 12/5  Echocardiogram 12/5  EEG 12/9  Percutaneous PEG tube placement 12/30  Antibiotics:     Time spent: 5 minutes    Erin Hearing ANP  [facility] 7 am - 330 pm/M-F 27 days

## 2020-08-18 NOTE — Progress Notes (Signed)
Nutrition Follow-up  DOCUMENTATION CODES:   Not applicable  INTERVENTION:   Continue Jevity1.5@ 4m/hr via PEG  461mProsource TF BID   200 ml free water flush every 3 hours  Tube feeding regimen provides2420kcal (100% of needs),122grams of protein, and 118655mf H2O. Total free water: 2786m62maily  NUTRITION DIAGNOSIS:   Inadequate oral intake related to inability to eat as evidenced by NPO status.  Ongoing  GOAL:   Patient will meet greater than or equal to 90% of their needs  Met with TF  MONITOR:   Diet advancement,Labs,Weight trends,TF tolerance,Skin,I & O's  REASON FOR ASSESSMENT:   Consult,Ventilator Enteral/tube feeding initiation and management  ASSESSMENT:   Patient with PMH significant for HTN and kidney stones. Presents this admission s/p cardiac arrest.  12/13- NGT d/c 12/15- cortrak placed, tip of tube in the stomach 12/17- s/p BSE- recommend continue NPO 12/19- pt pulled cortrak tube, replaced- placement verified by x-ray (stomach) 12/20- s/p BSE- pt refusing PO trials 12/30- cortrak removed, PEG placed  Reviewed I/O's: -295 ml x 24 hours and +567 ml since 08/04/20  UOP: 425 ml x 24 hours  Pt receiving nursing care at time of visit.   Per SLP notes, pt spitting out food when PO's are offered.   Pt remains NPO and dependent on TF via PEG (TF re-started today). Osmolite 1.5 infusing via cortrak tube at 65 ml/hr. Pt also receiving 45ml58msource TF BIDand200 ml free water flush every 3 hours.Tube feeding regimen provides2420kcal (100% of needs),120grams of protein, and 1189ml 13m2O. Total free water: 2789 ml/ daily.  Per TOC notes, pt for SNF placement at discharge. Family working on medicaInsurance underwriterbs reviewed: CBGS: 97-141 (inpatient orders for glycemic control are 0-9 units insulin aspart every 4 hours and 30 units insulin detemir BID).   Diet Order:   Diet Order            Diet NPO time specified  Except for: Ice Chips  Diet effective now                 EDUCATION NEEDS:   Not appropriate for education at this time  Skin:  Skin Assessment: Skin Integrity Issues: Skin Integrity Issues:: Other (Comment) Other: MASD to buttocks, skin tear to rt upper back  Last BM:  08/17/20  Height:   Ht Readings from Last 1 Encounters:  07/22/20 6' (1.829 m)    Weight:   Wt Readings from Last 1 Encounters:  08/18/20 88.6 kg    Ideal Body Weight:  80.9 kg  BMI:  Body mass index is 26.49 kg/m.  Estimated Nutritional Needs:   Kcal:  2300-2500 kcal  Protein:  115-130 grams  Fluid:  >/= 2 L/day    JenifeLoistine ChanceLDN, CDCES Ebonytered Dietitian II Certified Diabetes Care and Education Specialist Please refer to [facility] and/or RD on-call/weekend/after hours pager

## 2020-08-18 NOTE — Progress Notes (Signed)
IR.  History of dysphagia s/p percutaneous gastrostomy tube placement in IR 08/17/2020.  Patient laying in bed resting comfortably. He opens eyes to voice but quickly shuts them. Does not follow simple commands. Gastrostomy tube site with minimal amounts of dried blood under bumper, no active bleeding, erythema, or drainage noted.  Gastrostomy tube stable- tube is ready for use, please ensure bumper is cinched to skin to prevent leakage. Further plans per [facility]- appreciate and agree with management. Please call IR with questions/concerns.   Waylan Boga Demaya Hardge, PA-C 08/18/2020, 10:42 AM

## 2020-08-18 NOTE — Progress Notes (Signed)
   [facility] will sign off.   Medication Recommendations:  As on [facility] Other recommendations (labs, testing, etc):  None Follow up as an outpatient:  Call us as needed.

## 2020-08-19 LAB — CBC
HCT: 29.5 % — ABNORMAL LOW (ref 39.0–52.0)
Hemoglobin: 9.6 g/dL — ABNORMAL LOW (ref 13.0–17.0)
MCH: 34 pg (ref 26.0–34.0)
MCHC: 32.5 g/dL (ref 30.0–36.0)
MCV: 104.6 fL — ABNORMAL HIGH (ref 80.0–100.0)
Platelets: 200 10*3/uL (ref 150–400)
RBC: 2.82 MIL/uL — ABNORMAL LOW (ref 4.22–5.81)
RDW: 15 % (ref 11.5–15.5)
WBC: 6.9 10*3/uL (ref 4.0–10.5)
nRBC: 0 % (ref 0.0–0.2)

## 2020-08-19 LAB — BASIC METABOLIC PANEL
Anion gap: 8 (ref 5–15)
BUN: 23 mg/dL — ABNORMAL HIGH (ref 6–20)
CO2: 27 mmol/L (ref 22–32)
Calcium: 9 mg/dL (ref 8.9–10.3)
Chloride: 99 mmol/L (ref 98–111)
Creatinine, Ser: 1.29 mg/dL — ABNORMAL HIGH (ref 0.61–1.24)
GFR, Estimated: >60 mL/min (ref >60)
Glucose, Bld: 203 mg/dL — ABNORMAL HIGH (ref 70–99)
Potassium: 4 mmol/L (ref 3.5–5.1)
Sodium: 134 mmol/L — ABNORMAL LOW (ref 135–145)

## 2020-08-19 LAB — GLUCOSE, CAPILLARY
Glucose-Capillary: 152 mg/dL — ABNORMAL HIGH (ref 70–99)
Glucose-Capillary: 155 mg/dL — ABNORMAL HIGH (ref 70–99)
Glucose-Capillary: 172 mg/dL — ABNORMAL HIGH (ref 70–99)
Glucose-Capillary: 173 mg/dL — ABNORMAL HIGH (ref 70–99)
Glucose-Capillary: 184 mg/dL — ABNORMAL HIGH (ref 70–99)
Glucose-Capillary: 214 mg/dL — ABNORMAL HIGH (ref 70–99)

## 2020-08-19 NOTE — Progress Notes (Signed)
[facility] PROGRESS NOTE    Progress Note  Antonio Navarro  OFB:[id number] DOB: [birth year] DOA: 07/22/2020 PCP: Patient, No Pcp Per     Brief Narrative:   Antonio Navarro is an 60 y.o. male past medical history of essential hypertension chronic back pain who was admitted for cardiac arrest with downtime of 9 minutes out of hospital initial rhythm was V. fib receive shock and 6 rounds of epi before ROSC was achieved he was found to have a STEMI he status post cath with a drug-eluting stent to the circumflex currently on Brilinta and aspirin along with beta-blockers and statins.  Who was left with anoxic brain injury.  He was also in acute renal failure due to V. fib arrest and his creatinine has improved with IV fluid hydration. A 2D echo was done that showed an EF of 40%.on admission.He is currently on a beta-blocker and not on an ACE inhibitor due to renal failure.  Cardiology recommended to continue hydralazine and Isordil .  He Got high risk of aspiration and is currently n.p.o. with a core track.  Palliative care was consulted as his chances of meaningful recovery are nil.  Palliative care is working with the family to try to guide his treatment as they are unsure how to proceed.  He is uninsured so placement will be difficult.  Palliative care is currently working with the family to try to move towards comfort admission due to his poor prognosis.  Family meeting to be scheduled sometime in the middle of the week  Assessment/Plan:   Out of hospital V. fib due to acute ST elevation myocardial infarction (STEMI) ([place]) Post drug-eluting stent to the circumflex currently on aspirin and Brilinta.  Beta-Blockers and statins. Cardiology has signed off recommended to continue current management and to be notified for follow-up visit as an outpatient. Resume aspirin and Brilinta status post PEG tube placement no signs of bleeding.  Ischemic cardiomyopathy: With an EF of 40% by echo done on  this admission. Continue beta-blocker no ACE inhibitor due to renal disease. Continue hydralazine and isordil.  Acute respiratory failure with hypoxia: Now extubated speech evaluated the patient and deemed him very high risk for aspiration and they recommended n.p.o. Core track was placed. Satting greater 96% on 2 L.  Anoxi brain injury: In the setting of out of hospital V. fib arrest due to STEMI Core track in place continue tube feedings. Brilinta on hold scheduled for PEG tube on 08/17/2020, on Cangrelor.  Acute on chronic kidney disease stage IIIb: Likely prerenal in the setting of V. Fib. With a baseline like around 1.6-2, continue free water per tube. Continue to follow basic metabolic panel intermittently.  Nonsustained SVT: Currently on amiodarone was added yesterday no episodes overnight continue Coreg, further management per cardiology.  Hypernatremia: Continue free water per tube.  Hyperglycemia: With an A1c of 4.7, he is currently on tube feedings we have him on Lantus plus sliding scale.  Nutrition: Status post PEG tube placement on 08/17/2020, resume tube feedings.  Goals of care: Legally the son is the main medical decision maker however patient fianc and sisters are working together to determine what is best for the patient.  DVT prophylaxis: lovenox Family Communication:Son Status is: Inpatient  Remains inpatient appropriate because:Hemodynamically unstable   Dispo: The patient is from: Home              Anticipated d/c is to: SNF  Anticipated d/c date is: > 3 days              Patient currently is not medically stable to d/c. Code Status:     Code Status Orders  (From admission, onward)         Start     Ordered   07/22/20 0719  Full code  Continuous        07/22/20 0719        Code Status History    Date Active Date Inactive Code Status Order ID Comments User Context   07/22/2020 0559 07/22/2020 0719 Full Code [id number]   Shellia Cleverly, MD Inpatient   Advance Care Planning Activity        IV Access:    Peripheral IV   Procedures and diagnostic studies:   IR GASTROSTOMY TUBE MOD SED  Result Date: 08/17/2020 INDICATION: 60 year old male with dysphagia and protein calorie malnutrition. He requires percutaneous gastric access for nutrition. EXAM: Fluoroscopically guided placement of percutaneous pull-through gastrostomy tube Interventional Radiologist:  Criselda Peaches, MD MEDICATIONS: 2 g Ancef; 1 mg glucagon. Antibiotics were administered within 1 hour of the procedure. ANESTHESIA/SEDATION: Versed 1.5 mg IV; Fentanyl 50 mcg IV Moderate Sedation Time:  38 minutes The patient was continuously monitored during the procedure by the interventional radiology nurse under my direct supervision. CONTRAST:  1m OMNIPAQUE IOHEXOL 300 MG/ML  SOLN FLUOROSCOPY TIME:  Fluoroscopy Time: 16 minutes 36 seconds (167 mGy). COMPLICATIONS: None immediate. PROCEDURE: Informed written consent was obtained from the patient after a thorough discussion of the procedural risks, benefits and alternatives. All questions were addressed. Maximal Sterile Barrier Technique was utilized including caps, mask, sterile gowns, sterile gloves, sterile drape, hand hygiene and skin antiseptic. A timeout was performed prior to the initiation of the procedure. Maximal barrier sterile technique utilized including caps, mask, sterile gowns, sterile gloves, large sterile drape, hand hygiene, and chlorhexadine skin prep. An angled catheter was advanced over a wire under fluoroscopic guidance through the nose, down the esophagus and into the body of the stomach. The stomach was then insufflated with several 100 ml of air. Fluoroscopy confirmed location of the gastric bubble, as well as inferior displacement of the barium stained colon. Under direct fluoroscopic guidance, a single T-tack was placed, and the anterior gastric wall drawn up against the anterior  abdominal wall. Percutaneous access was then obtained into the mid gastric body with an 18 gauge sheath needle. Aspiration of air, and injection of contrast material under fluoroscopy confirmed needle placement. An Amplatz wire was advanced in the gastric body and the access needle exchanged for a 9-French vascular sheath. A snare device was advanced through the vascular sheath and an Amplatz wire advanced through the angled catheter. The Amplatz wire was successfully snared and this was pulled up through the esophagus and out the mouth. A 20-French KAlinda DoomsMIC-PEG tube was then connected to the snare and pulled through the mouth, down the esophagus, into the stomach and out to the anterior abdominal wall. Hand injection of contrast material confirmed intragastric location. The T-tack retention suture was then cut. The pull through peg tube was then secured with the external bumper and capped. The patient will be observed for several hours with the newly placed tube on low wall suction to evaluate for any post procedure complication. The patient tolerated the procedure well, there is no immediate complication. IMPRESSION: Successful placement of a 20 French pull through gastrostomy tube. Electronically Signed   By: HJacqulynn Cadet  M.D.   On: 08/17/2020 14:57     Medical Consultants:    None.  Anti-Infectives:   none  Subjective:    Antonio Navarro nonverbal.  Objective:    Vitals:   08/18/20 1644 08/18/20 1928 08/19/20 0134 08/19/20 0435  BP: 123/88 109/80 (!) 144/89 135/72  Pulse: 84 81 88 87  Resp: '20 20  18  ' Temp: 98.2 F (36.8 C) 98.4 F (36.9 C)  98.3 F (36.8 C)  TempSrc: Oral Oral  Oral  SpO2: 100% 100%  100%  Weight:    89.4 kg  Height:       SpO2: 100 % O2 Flow Rate (L/min): 4 L/min FiO2 (%): 36 %   Intake/Output Summary (Last 24 hours) at 08/19/2020 0928 Last data filed at 08/19/2020 0445 Gross per 24 hour  Intake 1925 ml  Output 1401 ml  Net 524 ml    Filed Weights   08/17/20 0416 08/18/20 0500 08/19/20 0435  Weight: 91 kg 88.6 kg 89.4 kg    Exam: General exam: In no acute distress. Respiratory system: Good air movement and clear to auscultation. Cardiovascular system: S1 & S2 heard, RRR. No JVD. Gastrointestinal system: Abdomen is nondistended, soft and nontender, no signs of bleeding PEG tube in place. Extremities: No pedal edema. Skin: No rashes, lesions or ulcers   Data Reviewed:    Labs: Basic Metabolic Panel: Recent Labs  Lab 08/19/20 0157  NA 134*  K 4.0  CL 99  CO2 27  GLUCOSE 203*  BUN 23*  CREATININE 1.29*  CALCIUM 9.0   GFR Estimated Creatinine Clearance: 68.5 mL/min (A) (by C-G formula based on SCr of 1.29 mg/dL (H)). Liver Function Tests: No results for input(s): AST, ALT, ALKPHOS, BILITOT, PROT, ALBUMIN in the last 168 hours. No results for input(s): LIPASE, AMYLASE in the last 168 hours. No results for input(s): AMMONIA in the last 168 hours. Coagulation profile Recent Labs  Lab 08/17/20 0255  INR 1.2   COVID-19 Labs  No results for input(s): DDIMER, FERRITIN, LDH, CRP in the last 72 hours.  Lab Results  Component Value Date   [place] NEGATIVE 07/22/2020    CBC: Recent Labs  Lab 08/17/20 0255 08/19/20 0157  WBC 7.6 6.9  HGB 9.2* 9.6*  HCT 30.1* 29.5*  MCV 108.7* 104.6*  PLT 209 200   Cardiac Enzymes: No results for input(s): CKTOTAL, CKMB, CKMBINDEX, TROPONINI in the last 168 hours. BNP (last 3 results) No results for input(s): PROBNP in the last 8760 hours. CBG: Recent Labs  Lab 08/18/20 1645 08/18/20 1931 08/19/20 0025 08/19/20 0432 08/19/20 0722  GLUCAP 222* 165* 152* 173* 184*   D-Dimer: No results for input(s): DDIMER in the last 72 hours. Hgb A1c: No results for input(s): HGBA1C in the last 72 hours. Lipid Profile: No results for input(s): CHOL, HDL, LDLCALC, TRIG, CHOLHDL, LDLDIRECT in the last 72 hours. Thyroid function studies: No results for  input(s): TSH, T4TOTAL, T3FREE, THYROIDAB in the last 72 hours.  Invalid input(s): FREET3 Anemia work up: No results for input(s): VITAMINB12, FOLATE, FERRITIN, TIBC, IRON, RETICCTPCT in the last 72 hours. Sepsis Labs: Recent Labs  Lab 08/17/20 0255 08/19/20 0157  WBC 7.6 6.9   Microbiology No results found for this or any previous visit (from the past 240 hour(s)).   Medications:   . aspirin  81 mg Per Tube Daily  . atorvastatin  80 mg Per Tube Daily  . bethanechol  10 mg Per Tube TID  . carvedilol  25  mg Per Tube BID WC  . chlorhexidine gluconate (MEDLINE KIT)  15 mL Mouth Rinse BID  . feeding supplement (PROSource TF)  45 mL Per Tube BID  . free water  200 mL Per Tube Q3H  . heparin  5,000 Units Subcutaneous Q8H  . insulin aspart  0-9 Units Subcutaneous Q4H  . insulin detemir  30 Units Subcutaneous BID  . isosorbide-hydrALAZINE  1 tablet Oral TID  . mouth rinse  15 mL Mouth Rinse BID  . polyvinyl alcohol  1 drop Both Eyes QID  . sodium chloride flush  3 mL Intravenous Q12H  . ticagrelor  90 mg Per Tube BID   Continuous Infusions: . sodium chloride 250 mL (08/17/20 0812)  . feeding supplement (JEVITY 1.5 CAL/FIBER) 65 mL/hr at 08/18/20 0816      LOS: 28 days   Charlynne Cousins  [facility]  08/19/2020, 9:28 AM

## 2020-08-20 LAB — GLUCOSE, CAPILLARY
Glucose-Capillary: 133 mg/dL — ABNORMAL HIGH (ref 70–99)
Glucose-Capillary: 145 mg/dL — ABNORMAL HIGH (ref 70–99)
Glucose-Capillary: 151 mg/dL — ABNORMAL HIGH (ref 70–99)
Glucose-Capillary: 157 mg/dL — ABNORMAL HIGH (ref 70–99)
Glucose-Capillary: 160 mg/dL — ABNORMAL HIGH (ref 70–99)
Glucose-Capillary: 179 mg/dL — ABNORMAL HIGH (ref 70–99)
Glucose-Capillary: 86 mg/dL (ref 70–99)

## 2020-08-20 NOTE — Progress Notes (Signed)
[facility] PROGRESS NOTE    Progress Note  Antonio Navarro  SLH:[id number] DOB: 11/06/1960 DOA: 07/22/2020 PCP: Patient, No Pcp Per     Brief Narrative:   Antonio Navarro is an 60 y.o. male past medical history of essential hypertension chronic back pain who was admitted for cardiac arrest with downtime of 9 minutes out of hospital initial rhythm was V. fib receive shock and 6 rounds of epi before ROSC was achieved he was found to have a STEMI he status post cath with a drug-eluting stent to the circumflex currently on Brilinta and aspirin along with beta-blockers and statins.  Who was left with anoxic brain injury.  He was also in acute renal failure due to V. fib arrest and his creatinine has improved with IV fluid hydration. A 2D echo was done that showed an EF of 40%.on admission.He is currently on a beta-blocker and not on an ACE inhibitor due to renal failure.  Cardiology recommended to continue hydralazine and Isordil .  He Got high risk of aspiration and is currently n.p.o. with a core track.  Palliative care was consulted as his chances of meaningful recovery are nil.  Palliative care is working with the family to try to guide his treatment as they are unsure how to proceed.  He is uninsured so placement will be difficult.  Palliative care is currently working with the family to try to move towards comfort admission due to his poor prognosis.  Family meeting to be scheduled sometime in the middle of the week  Assessment/Plan:   Out of hospital V. fib due to acute ST elevation myocardial infarction (STEMI) ([place]) Post drug-eluting stent to the circumflex currently on aspirin and Brilinta.  Beta-Blockers and statins. Cardiology has signed off recommended to continue current management and to be notified for follow-up visit as an outpatient. Resume aspirin and Brilinta status post PEG tube placement no signs of bleeding.  Ischemic cardiomyopathy: With an EF of 40% by echo done on  this admission. Continue beta-blocker no ACE inhibitor due to renal disease. Continue hydralazine and isordil.  Acute respiratory failure with hypoxia: Now extubated speech evaluated the patient and deemed him very high risk for aspiration and they recommended n.p.o. Core track was placed. Satting greater 96% on 2 L.  Anoxi brain injury: In the setting of out of hospital V. fib arrest due to STEMI Core track in place continue tube feedings. Brilinta on hold scheduled for PEG tube on 08/17/2020, on Cangrelor.  Acute on chronic kidney disease stage IIIb: Likely prerenal in the setting of V. Fib. With a baseline like around 1.6-2, continue free water per tube. Continue to follow basic metabolic panel intermittently.  Nonsustained SVT: Currently on amiodarone was added yesterday no episodes overnight continue Coreg, further management per cardiology.  Hypernatremia: Continue free water per tube.  Hyperglycemia: With an A1c of 4.7, he is currently on tube feedings we have him on Lantus plus sliding scale.  Nutrition: Status post PEG tube placement on 08/17/2020, resume tube feedings.  Goals of care: Legally the son is the main medical decision maker however patient fianc and sisters are working together to determine what is best for the patient.  DVT prophylaxis: lovenox Family Communication:Son Status is: Inpatient  Remains inpatient appropriate because:Hemodynamically unstable   Dispo: The patient is from: Home              Anticipated d/c is to: SNF  Anticipated d/c date is: > 3 days              Patient currently is not medically stable to d/c. Code Status:     Code Status Orders  (From admission, onward)         Start     Ordered   07/22/20 0719  Full code  Continuous        07/22/20 0719        Code Status History    Date Active Date Inactive Code Status Order ID Comments User Context   07/22/2020 0559 07/22/2020 0719 Full Code [id number]   Shellia Cleverly, MD Inpatient   Advance Care Planning Activity        IV Access:    Peripheral IV   Procedures and diagnostic studies:   No results found.   Medical Consultants:    None.  Anti-Infectives:   none  Subjective:    Antonio Navarro nonverbal.  Objective:    Vitals:   08/19/20 1142 08/19/20 2143 08/20/20 0500 08/20/20 0649  BP: 106/74 97/74  123/87  Pulse: 85 92  85  Resp: '16 18  18  ' Temp: 98.3 F (36.8 C) 98.2 F (36.8 C)  97.8 F (36.6 C)  TempSrc: Oral Oral  Oral  SpO2: 100% 99%  98%  Weight:   87 kg   Height:       SpO2: 98 % O2 Flow Rate (L/min): 4 L/min FiO2 (%): 36 %   Intake/Output Summary (Last 24 hours) at 08/20/2020 1039 Last data filed at 08/20/2020 0000 Gross per 24 hour  Intake 1950 ml  Output 1360 ml  Net 590 ml   Filed Weights   08/18/20 0500 08/19/20 0435 08/20/20 0500  Weight: 88.6 kg 89.4 kg 87 kg    Exam: General exam: In no acute distress. Respiratory system: Good air movement and clear to auscultation. Cardiovascular system: S1 & S2 heard, RRR. No JVD. Gastrointestinal system: Abdomen is nondistended, soft and nontender, no signs of bleeding PEG tube in place. Extremities: No pedal edema. Skin: No rashes, lesions or ulcers   Data Reviewed:    Labs: Basic Metabolic Panel: Recent Labs  Lab 08/19/20 0157  NA 134*  K 4.0  CL 99  CO2 27  GLUCOSE 203*  BUN 23*  CREATININE 1.29*  CALCIUM 9.0   GFR Estimated Creatinine Clearance: 68.5 mL/min (A) (by C-G formula based on SCr of 1.29 mg/dL (H)). Liver Function Tests: No results for input(s): AST, ALT, ALKPHOS, BILITOT, PROT, ALBUMIN in the last 168 hours. No results for input(s): LIPASE, AMYLASE in the last 168 hours. No results for input(s): AMMONIA in the last 168 hours. Coagulation profile Recent Labs  Lab 08/17/20 0255  INR 1.2   COVID-19 Labs  No results for input(s): DDIMER, FERRITIN, LDH, CRP in the last 72 hours.  Lab Results   Component Value Date   [place] NEGATIVE 07/22/2020    CBC: Recent Labs  Lab 08/17/20 0255 08/19/20 0157  WBC 7.6 6.9  HGB 9.2* 9.6*  HCT 30.1* 29.5*  MCV 108.7* 104.6*  PLT 209 200   Cardiac Enzymes: No results for input(s): CKTOTAL, CKMB, CKMBINDEX, TROPONINI in the last 168 hours. BNP (last 3 results) No results for input(s): PROBNP in the last 8760 hours. CBG: Recent Labs  Lab 08/19/20 1646 08/19/20 1957 08/20/20 0005 08/20/20 0421 08/20/20 0721  GLUCAP 155* 172* 160* 86 133*   D-Dimer: No results for input(s): DDIMER in the last 72 hours.  Hgb A1c: No results for input(s): HGBA1C in the last 72 hours. Lipid Profile: No results for input(s): CHOL, HDL, LDLCALC, TRIG, CHOLHDL, LDLDIRECT in the last 72 hours. Thyroid function studies: No results for input(s): TSH, T4TOTAL, T3FREE, THYROIDAB in the last 72 hours.  Invalid input(s): FREET3 Anemia work up: No results for input(s): VITAMINB12, FOLATE, FERRITIN, TIBC, IRON, RETICCTPCT in the last 72 hours. Sepsis Labs: Recent Labs  Lab 08/17/20 0255 08/19/20 0157  WBC 7.6 6.9   Microbiology No results found for this or any previous visit (from the past 240 hour(s)).   Medications:   . aspirin  81 mg Per Tube Daily  . atorvastatin  80 mg Per Tube Daily  . bethanechol  10 mg Per Tube TID  . carvedilol  25 mg Per Tube BID WC  . chlorhexidine gluconate (MEDLINE KIT)  15 mL Mouth Rinse BID  . feeding supplement (PROSource TF)  45 mL Per Tube BID  . free water  200 mL Per Tube Q3H  . heparin  5,000 Units Subcutaneous Q8H  . insulin aspart  0-9 Units Subcutaneous Q4H  . insulin detemir  30 Units Subcutaneous BID  . isosorbide-hydrALAZINE  1 tablet Oral TID  . mouth rinse  15 mL Mouth Rinse BID  . polyvinyl alcohol  1 drop Both Eyes QID  . sodium chloride flush  3 mL Intravenous Q12H  . ticagrelor  90 mg Per Tube BID   Continuous Infusions: . sodium chloride 250 mL (08/17/20 0812)  . feeding supplement  (JEVITY 1.5 CAL/FIBER) 65 mL/hr at 08/18/20 0816      LOS: 29 days   Charlynne Cousins  [facility]  08/20/2020, 10:39 AM

## 2020-08-21 DIAGNOSIS — G931 Anoxic brain damage, not elsewhere classified: Secondary | ICD-10-CM

## 2020-08-21 DIAGNOSIS — I2129 ST elevation (STEMI) myocardial infarction involving other sites: Secondary | ICD-10-CM

## 2020-08-21 DIAGNOSIS — R1310 Dysphagia, unspecified: Secondary | ICD-10-CM

## 2020-08-21 LAB — GLUCOSE, CAPILLARY
Glucose-Capillary: 164 mg/dL — ABNORMAL HIGH (ref 70–99)
Glucose-Capillary: 144 mg/dL — ABNORMAL HIGH (ref 70–99)
Glucose-Capillary: 150 mg/dL — ABNORMAL HIGH (ref 70–99)
Glucose-Capillary: 160 mg/dL — ABNORMAL HIGH (ref 70–99)
Glucose-Capillary: 111 mg/dL — ABNORMAL HIGH (ref 70–99)

## 2020-08-21 NOTE — Progress Notes (Addendum)
Physical Therapy Treatment Patient Details Name: Antonio Navarro MRN: [id number] DOB: August 03, 1961 Today's Date: 08/21/2020    History of Present Illness 60 y.o. male with history of tobacco abuse, obesity, HTN who was admitted 07/22/20 post cardiac arrest with inferolateral STEMI. He was resuscitated in the field by EMS after being found to be in V fib. Approximately 30 minutes of CPR. He was intubated on arrival.    PT Comments    Pt demonstrates improvement in the areas of alertness, cognition, activity tolerance and mobility. Pt oriented to self; able to verbalize one word responses today such as answering his name was "Poul," and answers to yes/no questions. Following 50-75% of one step commands throughout session. Pt requiring two person moderate assist for functional mobility. Able to stand x 2 from edge of bed and on second trial, pivot over to recliner. Pt gave PT a high five once up in the chair. Pt demonstrates decreased coordination and static/dynamic balance. Highly recommending CIR to address deficits and maximize functional mobility based on motivation, age, PLOF, and family support.     Follow Up Recommendations  CIR     Equipment Recommendations  Other (comment) (TBA)    Recommendations for Other Services       Precautions / Restrictions Precautions Precautions: Fall Precaution Comments: mitts, gtube Restrictions Weight Bearing Restrictions: No    Mobility  Bed Mobility Overal bed mobility: Needs Assistance Bed Mobility: Supine to Sit;Sit to Supine     Supine to sit: +2 for physical assistance;Mod assist     General bed mobility comments: Cues for initiation, needs assist with execution of BLE's off edge of bed and trunk to upright  Transfers Overall transfer level: Needs assistance Equipment used: None Transfers: Stand Pivot Transfers;Sit to/from Stand Sit to Stand: Mod assist;+2 physical assistance Stand pivot transfers: Mod assist;+2 physical  assistance       General transfer comment: ModA + 2 to stand x 2 from edge of bed. Pt able to statically stand in order for NT to perform peri care, and then performed subsequent stand pivot over to chair with cues for sequencing/direction. Decreased motor coordination noted  Ambulation/Gait                 Stairs             Wheelchair Mobility    Modified Rankin (Stroke Patients Only)       Balance Overall balance assessment: Needs assistance Sitting-balance support: Bilateral upper extremity supported;Feet supported Sitting balance-Leahy Scale: Poor Sitting balance - Comments: close min guard for safety, decreased trunk stability   Standing balance support: Bilateral upper extremity supported;During functional activity Standing balance-Leahy Scale: Poor Standing balance comment: reliant on external support                            Cognition Arousal/Alertness: Awake/alert Behavior During Therapy: WFL for tasks assessed/performed Overall Cognitive Status: Impaired/Different from baseline Area of Impairment: Attention;Awareness;Following commands;Safety/judgement;Orientation                 Orientation Level: Disoriented to;Place;Time;Situation Current Attention Level: Sustained   Following Commands: Follows one step commands inconsistently Safety/Judgement: Decreased awareness of safety;Decreased awareness of deficits Awareness: Intellectual   General Comments: Pt following 50-75% of 1 step commands, verbalizing one word responses, giving PT a high five and smiling when he got up in the chair      Exercises      General Comments  Pertinent Vitals/Pain Pain Assessment: Faces Faces Pain Scale: Hurts a little bit Pain Location: bottom with peri care Pain Descriptors / Indicators: Grimacing;Guarding;Moaning Pain Intervention(s): Monitored during session    Home Living                      Prior Function             PT Goals (current goals can now be found in the care plan section) Acute Rehab PT Goals Patient Stated Goal: pt fiance would like him to get stronger Potential to Achieve Goals: Fair Progress towards PT goals: Progressing toward goals    Frequency    Min 3X/week      PT Plan Discharge plan needs to be updated    Co-evaluation              AM-PAC PT "6 Clicks" Mobility   Outcome Measure  Help needed turning from your back to your side while in a flat bed without using bedrails?: A Lot Help needed moving from lying on your back to sitting on the side of a flat bed without using bedrails?: A Lot Help needed moving to and from a bed to a chair (including a wheelchair)?: A Lot Help needed standing up from a chair using your arms (e.g., wheelchair or bedside chair)?: A Lot Help needed to walk in hospital room?: Total Help needed climbing 3-5 steps with a railing? : Total 6 Click Score: 10    End of Session Equipment Utilized During Treatment: Gait belt Activity Tolerance: Patient tolerated treatment well Patient left: in chair;with call bell/phone within reach;with chair alarm set;Other (comment) (posey chair alarm belt) Nurse Communication: Mobility status PT Visit Diagnosis: Other abnormalities of gait and mobility (R26.89)     Time: 3428-7681 PT Time Calculation (min) (ACUTE ONLY): 26 min  Charges:  $Therapeutic Activity: 23-37 mins                     Lillia Pauls, PT, DPT Acute Rehabilitation Services Pager [phone] Office [phone]    Norval Morton 08/21/2020, 1:55 PM

## 2020-08-21 NOTE — Progress Notes (Signed)
Rehab Admissions Coordinator Note:  Per PT request, this patient was re-screened by Cheri Rous for appropriateness for an Inpatient Acute Rehab Consult.    Based on updated PT note, feel this patient may now be able to tolerate CIR. Will re-place CIR consult order to allow for further assessment by [facility] for readiness and appropriateness.   Cheri Rous 08/21/2020, 2:02 PM  I can be reached at [phone].

## 2020-08-21 NOTE — Progress Notes (Addendum)
[facility] PROGRESS NOTE  Antonio Navarro QQP:[id number] DOB: 02/12/1961 DOA: 07/22/2020 PCP: Patient, No Pcp Per  Status: Remains inpatient appropriate because:Altered mental status and Unsafe d/c plan   Dispo: The patient is from: Home              Anticipated d/c is to: SNF              Anticipated d/c date is: > 3 days              Patient currently is medically stable to d/c.  Barriers to discharge: Needs long-term SNF.  Medicaid and disability pending.  Patient has been evaluated by [facility] but had not been appropriate for aggressive rehabilitative therapies.  Of 1/3 patient has woken up and was participating with PT therefore CIR asked to come by and reevaluate patient   Code Status: Full Family Communication: Attending physician spoke with son on 12/30 DVT prophylaxis: sq heparin Vaccination status: Completed both doses of Pfizer Covid vaccination on 12/14/2019  Foley catheter: No  HPI: 60 year old male history of hypertension prescribed Norvasc prior to admission, chronic back pain, prior renal calculi who experienced an out of hospital cardiac arrest with downtime of 9 minutes.  Initial rhythm was ventricular fibrillation.  He was defibrillated deceived 6 doses of epi before ROSC.  Subsequent work-up revealed STEMI.  He underwent cardiac catheterization with a DES placed to the circumflex.  Cardiology following and has placed patient on Brilinta and aspirin and is also being treated with beta-blocker and statins.  From a neurological standpoint he clinically has anoxic brain injury.  During this hospitalization he experienced AKI and was treated with fluids and correction of hypoperfusion from cardiac arrest.  Echo performed this admission revealed ischemic cardiomyopathy with an EF of 40%  In addition to above patient has been unable to pass swallowing evaluations and subsequently has undergone percutaneous PEG tube placement on 12/30.  Patient is awake and moving all extremities  spontaneously but does not follow commands and does not verbalize palliative care has met with patient and family and has emphasized poor prognosis and will continue to meet with the family during the hospitalization.  Current plan is to transition to SNF bed once available.  Expect need for lifelong 24/7 care for all ADLs.  Subjective: Awake today.  Sustaining eye contact and responding verbally with monosyllabic verbiage which is new.  Appears to be following some simple commands when asked to move extremities but appears to have some difficulty with coordination.  Remained awake during entire interaction this morning which is new  Objective: Vitals:   08/21/20 0515 08/21/20 1137  BP: (!) 143/85 100/75  Pulse: 85 72  Resp: 18 19  Temp: 98.5 F (36.9 C) 97.8 F (36.6 C)  SpO2: 97% 100%    Intake/Output Summary (Last 24 hours) at 08/21/2020 1200 Last data filed at 08/21/2020 0515 Gross per 24 hour  Intake 540 ml  Output 1675 ml  Net -1135 ml   Filed Weights   08/19/20 0435 08/20/20 0500 08/21/20 0407  Weight: 89.4 kg 87 kg 89.3 kg    Exam:  Constitutional: NAD, calm, comfortable Respiratory: Bilateral lung sounds clear to auscultation anteriorly.  Room air, no increased work of breathing Cardiovascular: Pulses regular.  Heart sounds normal S1-S2.  No peripheral edema. Abdomen: Soft, nondistended nontender with normoactive bowel sounds.  PEG tube now in place and abdomen and site unremarkable.  Musculoskeletal: No contractures. Normal muscle tone and no hypertonicity or spasticity observed.  Skin:  Bilateral Prevalon boots applied to feet-does have skin tear to back see below regarding documentation Neurologic: CN 2-12 grossly intact.  Attempts to follow simple commands with extremities but appears to have some underlying discoordination.  Extremities are moving equally.  Inconsistent following of commands therefore unable to test strength.  Sensation appears to be  intact. Psychiatric: Alert and awake.  Maintains eye contact when spoken to.  Attempts to verbally respond with simple monosyllabic phrases confused.  Did not know he was in the hospital or rationale for being in the hospital.   Assessment/Plan: Acute problems: Out of hospital cardiac arrest secondary to STEMI -ROSC and subsequent catheterization revealed disease in circumflex requiring DES -Continue beta-blocker, statin; Brilinta which had been held briefly for PEG tube resumed on 12/30 by cardiology  Ischemic cardiomyopathy -Echo this admission EF 40% -Continue pharmacotherapy as above -No ACE inhibitor secondary to underlying kidney disease -Continue hydralazine and Isordil -Likely will need follow-up echo in 3 months from initial echo  Significant anoxic brain injury secondary to cardiac arrest -As of 1/3 patient is much more alert maintaining eye contact and attempting to speak in monosyllabic phrases -Anticipate will need lifelong 24/7 care at a skilled nursing facility -No hypertonicity or spasticity observed -No seizure activity since onset of anoxic brain injury  Hypertension -Was on Norvasc prior to admission -Continue cardiovascular meds as listed above  Nonsustained VT -Continue amiodarone and beta-blocker -Cardiology managing  Acute on chronic kidney disease stage IIIb -Suspect prerenal etiology in the setting of hypoperfusion from ventricular fibrillation arrest -Baseline creatinine between 1.6 and 2; creatinine on 12/19 was 1.67-will repeat in a.m. on 08/19/2020 -Continue free water per tube and intermittently follow labs  Dysphagia, moderate protein calorie malnutrition Nutrition Problem: Inadequate oral intake Etiology: inability to eat Signs/Symptoms: NPO status Interventions: Tube feeding,Prostat Estimated body mass index is 26.7 kg/m as calculated from the following:   Height as of this encounter: 6' (1.829 m).   Weight as of this encounter: 89.3  kg.  Skin tear right lateral back Wound / Incision (Open or Dehisced) 07/28/20 Skin tear Back Lateral;Right;Upper (Active)  Date First Assessed/Time First Assessed: 07/28/20 0300   Wound Type: Skin tear  Location: Back  Location Orientation: Lateral;Right;Upper  Present on Admission: No    Assessments 07/28/2020  7:49 PM 08/19/2020  8:00 AM  Dressing Type None Foam - Lift dressing to assess site every shift  Drainage Amount None --  Treatment Cleansed --     No Linked orders to display     Wound / Incision (Open or Dehisced) 08/04/20 (MASD) Moisture Associated Skin Damage Buttocks Right;Left (Active)  Date First Assessed/Time First Assessed: 08/04/20 0800   Wound Type: (MASD) Moisture Associated Skin Damage  Location: Buttocks  Location Orientation: Right;Left    Assessments 08/04/2020  8:16 AM 08/19/2020  8:00 AM  Dressing Type Foam - Lift dressing to assess site every shift Foam - Lift dressing to assess site every shift  Dressing Changed New --  Dressing Status Clean;Dry;Intact --  Dressing Change Frequency PRN --  Treatment Cleansed --     No Linked orders to display    Other problems: Acute hypernatremia -Continue free water per 2  Hyperglycemia without an underlying diagnosis of diabetes mellitus -Likely related to acute stress -CBGs while on tube feedings controlled with sliding scale insulin and high-dose Levemir insulin BID CBGs well-controlled at this juncture -Consider change to Glucerna tube feeding Levemir dosage can be decreased to minimize risk for unintended hypoglycemia -Hemoglobin  A1c 4.7   Acute hypoxemic respiratory failure -Resolved -Stable on room air with O2 sats between 97 and 99%   Data Reviewed: Basic Metabolic Panel: Recent Labs  Lab 08/19/20 0157  NA 134*  K 4.0  CL 99  CO2 27  GLUCOSE 203*  BUN 23*  CREATININE 1.29*  CALCIUM 9.0   Liver Function Tests: No results for input(s): AST, ALT, ALKPHOS, BILITOT, PROT, ALBUMIN in the last 168  hours. No results for input(s): LIPASE, AMYLASE in the last 168 hours. No results for input(s): AMMONIA in the last 168 hours. CBC: Recent Labs  Lab 08/17/20 0255 08/19/20 0157  WBC 7.6 6.9  HGB 9.2* 9.6*  HCT 30.1* 29.5*  MCV 108.7* 104.6*  PLT 209 200   Cardiac Enzymes: No results for input(s): CKTOTAL, CKMB, CKMBINDEX, TROPONINI in the last 168 hours. BNP (last 3 results) No results for input(s): BNP in the last 8760 hours.  ProBNP (last 3 results) No results for input(s): PROBNP in the last 8760 hours.  CBG: Recent Labs  Lab 08/20/20 2018 08/20/20 2336 08/21/20 0349 08/21/20 0717 08/21/20 1108  GLUCAP 151* 145* 164* 144* 150*    No results found for this or any previous visit (from the past 240 hour(s)).   Studies: No results found.  Scheduled Meds: . aspirin  81 mg Per Tube Daily  . atorvastatin  80 mg Per Tube Daily  . bethanechol  10 mg Per Tube TID  . carvedilol  25 mg Per Tube BID WC  . chlorhexidine gluconate (MEDLINE KIT)  15 mL Mouth Rinse BID  . feeding supplement (PROSource TF)  45 mL Per Tube BID  . free water  200 mL Per Tube Q3H  . heparin  5,000 Units Subcutaneous Q8H  . insulin aspart  0-9 Units Subcutaneous Q4H  . insulin detemir  30 Units Subcutaneous BID  . isosorbide-hydrALAZINE  1 tablet Oral TID  . mouth rinse  15 mL Mouth Rinse BID  . polyvinyl alcohol  1 drop Both Eyes QID  . sodium chloride flush  3 mL Intravenous Q12H  . ticagrelor  90 mg Per Tube BID   Continuous Infusions: . sodium chloride 250 mL (08/17/20 0812)  . feeding supplement (JEVITY 1.5 CAL/FIBER) 65 mL/hr at 08/18/20 8867    Active Problems:   Acute ST elevation myocardial infarction (STEMI) [facility])   Cardiac arrest (HCC)   Elevated blood pressure reading with diagnosis of hypertension   Shock circulatory (HCC)   Encephalopathy   History of ETT   Cardiomyopathy, ischemic   Consultants:  Cardiology  Interventional  radiology  PCCM  Neurology  Procedures:  Cardiac catheterization 12/4  Continuous EEG 12/4 and overnight EEG with video 12/5  Echocardiogram 12/5  EEG 12/9  Percutaneous PEG tube placement 12/30  Antibiotics:     Time spent: 5 minutes    Erin Hearing ANP  [facility] 7 am - 330 pm/M-F 30 days

## 2020-08-22 DIAGNOSIS — Z7189 Other specified counseling: Secondary | ICD-10-CM

## 2020-08-22 LAB — GLUCOSE, CAPILLARY
Glucose-Capillary: 148 mg/dL — ABNORMAL HIGH (ref 70–99)
Glucose-Capillary: 148 mg/dL — ABNORMAL HIGH (ref 70–99)
Glucose-Capillary: 158 mg/dL — ABNORMAL HIGH (ref 70–99)
Glucose-Capillary: 108 mg/dL — ABNORMAL HIGH (ref 70–99)
Glucose-Capillary: 128 mg/dL — ABNORMAL HIGH (ref 70–99)

## 2020-08-22 MED ORDER — AMANTADINE HCL 100 MG PO CAPS
100.0000 mg | ORAL_CAPSULE | Freq: Every day | ORAL | Status: DC
  Administered 2020-08-22: 100 mg via ORAL
  Filled 2020-08-22 (×3): qty 1

## 2020-08-22 MED ORDER — QUETIAPINE FUMARATE 25 MG PO TABS
25.0000 mg | ORAL_TABLET | Freq: Every day | ORAL | Status: DC
  Administered 2020-08-22: 25 mg via ORAL
  Filled 2020-08-22: qty 1

## 2020-08-22 NOTE — Progress Notes (Signed)
CSW called and left message with Janey Greaser Revels from [employer] to inquire about pt medicaid application status.  [phone].

## 2020-08-22 NOTE — Progress Notes (Addendum)
Patient ID: Antonio Navarro, male   DOB: 04-10-1961, 60 y.o.   MRN: [id number]  This NP reviewed medical records , received report from team and then visited patient at the bedside as a follow up  for palliative medicine needs and emotional support.  Patient is awake and makes eye contact when spoken to.  He is lethargic and unable to follow commands today.  Today I spoke to the patient's 2 sisters Antonio Navarro/Antonio Navarro and his fianc Antonio Navarro, to offer emotional support, answer questions and concerns regarding current medical situation and again raised awareness to the importance of continued conversation with each other and family members, and the medical providers regarding overall plan of care and treatment options, ensuring decisions are within the context of the patient's values and goals of care.  Ongoing education regarding diagnosis and likely long-term trajectory secondary to anoxic brain injury.  Education offered on the importance of documentation of advanced care planning, raised awareness specifically to CODE STATUS. Encouraged family to consider DNR/DNI status understanding evidenced based poor outcomes in similar hospitalized patient, as the cause of arrest is likely associated with advanced chronic illness rather than an easily reversible acute cardio-pulmonary event.  Education offered on the likely next steps of transition of care to a skilled nursing facility for ongoing rehabilitation.  All family members verbalize understanding,  however at this point they are open to all offered and available medical interventions to prolong life and are hopeful for improvement.  "We have faith and prayer"   Patient remains FULL CODE  Patient does not have medical decisional capacity at this time, .   His legal decision maker is his adult son Antonio Navarro  Questions and concerns addressed   PMT will continue to support holistically.  Family encouraged to call with questions or concerns.  Total time spent  on the unit was 35 minutes  Greater than 50% of the time was spent in counseling and coordination of care  Lorinda Creed NP  Palliative Medicine Team Team Phone # [phone] Pager [phone]

## 2020-08-22 NOTE — Progress Notes (Signed)
OT Cancellation Note  Patient Details Name: Antonio Navarro MRN: [id number] DOB: Aug 10, 1961   Cancelled Treatment:    Reason Eval/Treat Not Completed: Other (comment) Attempted to see this am however will return at later time due to level of arousal. Nsg states that per morning report pt was "up all night".Unsure of pt's sleep patterns. Will discuss with NP.   Burnett Corrente Tobby Fawcett, OT/L   Acute OT Clinical Specialist Acute Rehabilitation Services Pager [phone] Office [phone]  08/22/2020, 8:42 AM

## 2020-08-22 NOTE — Progress Notes (Signed)
SLP Cancellation Note  Patient Details Name: Antonio Navarro MRN: [id number] DOB: 04-Feb-1961   Cancelled treatment:       Reason Eval/Treat Not Completed: Patient unavailable/receiving nursing care (getting cleaned up). Will f/u as able.    Mahala Menghini., M.A. CCC-SLP Acute Rehabilitation Services Pager [phone] Office [phone]  08/22/2020, 3:28 PM

## 2020-08-22 NOTE — Progress Notes (Signed)
[facility] PROGRESS NOTE  Antonio Navarro FGH:[id number] DOB: [birth year] DOA: 07/22/2020 PCP: Patient, No Pcp Per  Status: Remains inpatient appropriate because:Altered mental status and Unsafe d/c plan   Dispo: The patient is from: Home              Anticipated d/c is to: SNF              Anticipated d/c date is: > 3 days              Patient currently is medically stable to d/c.  Barriers to discharge: Needs long-term SNF.  Medicaid and disability pending.  Patient has been evaluated by [facility] but had not been appropriate for aggressive rehabilitative therapies.  Of 1/3 patient has woken up and was participating with PT therefore CIR asked to come by and reevaluate patient   Code Status: Full Family Communication: Attending physician spoke with son on 12/30 DVT prophylaxis: sq heparin Vaccination status: Completed both doses of Pfizer Covid vaccination on 12/14/2019  Foley catheter: No  HPI: 60 year old male history of hypertension prescribed Norvasc prior to admission, chronic back pain, prior renal calculi who experienced an out of hospital cardiac arrest with downtime of 9 minutes.  Initial rhythm was ventricular fibrillation.  He was defibrillated deceived 6 doses of epi before ROSC.  Subsequent work-up revealed STEMI.  He underwent cardiac catheterization with a DES placed to the circumflex.  Cardiology following and has placed patient on Brilinta and aspirin and is also being treated with beta-blocker and statins.  From a neurological standpoint he clinically has anoxic brain injury.  During this hospitalization he experienced AKI and was treated with fluids and correction of hypoperfusion from cardiac arrest.  Echo performed this admission revealed ischemic cardiomyopathy with an EF of 40%  In addition to above patient has been unable to pass swallowing evaluations and subsequently has undergone percutaneous PEG tube placement on 12/30.  Patient is awake and moving all extremities  spontaneously but does not follow commands and does not verbalize palliative care has met with patient and family and has emphasized poor prognosis and will continue to meet with the family during the hospitalization.  Current plan is to transition to SNF bed once available.  Expect need for lifelong 24/7 care for all ADLs.  Subjective: Very drowsy today.  Told OT that he had not slept last night.  I eventually awakened patient and he did nod yes to a question that was asked.  Objective: Vitals:   08/21/20 2128 08/22/20 0542  BP: (!) 137/92 112/78  Pulse: 73 78  Resp: 18 20  Temp: 98.2 F (36.8 C) 98.1 F (36.7 C)  SpO2: 100% 99%    Intake/Output Summary (Last 24 hours) at 08/22/2020 1018 Last data filed at 08/22/2020 0600 Gross per 24 hour  Intake 5540 ml  Output 1125 ml  Net 4415 ml   Filed Weights   08/20/20 0500 08/21/20 0407 08/22/20 0542  Weight: 87 kg 89.3 kg 90.9 kg    Exam:  Constitutional: NAD, calm, comfortable Respiratory: Able on room air, bilateral lung sounds clear to auscultation anteriorly, Cardiovascular: Pulses regular nontachycardic at rest, no JVD or peripheral edema, heart sounds are normal S1-S2 Abdomen: Normoactive bowel sounds, soft and nontender.  PEG in place for tube feedings and meds Musculoskeletal: No contractures. Normal muscle tone and no hypertonicity or spasticity observed.  Neurologic: CN 2-12 grossly intact.  Noted to be moving all extremities equally and with purpose.  Sensation intact. Psychiatric: Drowsy  today.  Eventually did awaken and was minimally conversant.  Did report that he did not sleep well overnight.   Assessment/Plan: Acute problems: Out of hospital cardiac arrest secondary to STEMI -ROSC and subsequent catheterization revealed disease in circumflex requiring DES -Continue beta-blocker, statin; Brilinta which had been held briefly for PEG tube resumed on 12/30 by cardiology  Ischemic cardiomyopathy -Echo this admission EF  40% -Continue pharmacotherapy as above -No ACE inhibitor secondary to underlying kidney disease -Continue hydralazine and Isordil -Likely will need follow-up echo in 3 months from initial echo  Significant anoxic brain injury secondary to cardiac arrest -As of 1/3 patient is much more alert maintaining eye contact and attempting to speak in monosyllabic phrases -Anticipate will need lifelong 24/7 care at a skilled nursing facility -No hypertonicity or spasticity observed -No seizure activity since onset of anoxic brain injury -We will add Seroquel 25 mg at HS in regards to reports of insomnia -We will also add daytime Symmetrel to help contain daytime alertness in context of brain injury  Hypertension -Was on Norvasc prior to admission -Continue cardiovascular meds as listed above  Nonsustained VT -Continue amiodarone and beta-blocker -Cardiology managing  Acute on chronic kidney disease stage IIIb -Suspect prerenal etiology in the setting of hypoperfusion from ventricular fibrillation arrest -Baseline creatinine between 1.6 and 2; creatinine on 12/19 was 1.67-will repeat in a.m. on 08/19/2020 -Continue free water per tube and intermittently follow labs  Dysphagia, moderate protein calorie malnutrition Nutrition Problem: Inadequate oral intake Etiology: inability to eat Signs/Symptoms: NPO status Interventions: Tube feeding,Prostat Estimated body mass index is 27.18 kg/m as calculated from the following:   Height as of this encounter: 6' (1.829 m).   Weight as of this encounter: 90.9 kg.  Skin tear right lateral back Wound / Incision (Open or Dehisced) 07/28/20 Skin tear Back Lateral;Right;Upper (Active)  Date First Assessed/Time First Assessed: 07/28/20 0300   Wound Type: Skin tear  Location: Back  Location Orientation: Lateral;Right;Upper  Present on Admission: No    Assessments 07/28/2020  7:49 PM 08/19/2020  8:00 AM  Dressing Type None Foam - Lift dressing to assess site  every shift  Drainage Amount None -  Treatment Cleansed -     No Linked orders to display     Wound / Incision (Open or Dehisced) 08/04/20 (MASD) Moisture Associated Skin Damage Buttocks Right;Left (Active)  Date First Assessed/Time First Assessed: 08/04/20 0800   Wound Type: (MASD) Moisture Associated Skin Damage  Location: Buttocks  Location Orientation: Right;Left    Assessments 08/04/2020  8:16 AM 08/19/2020  8:00 AM  Dressing Type Foam - Lift dressing to assess site every shift Foam - Lift dressing to assess site every shift  Dressing Changed New -  Dressing Status Clean;Dry;Intact -  Dressing Change Frequency PRN -  Treatment Cleansed -     No Linked orders to display    Other problems: Acute hypernatremia -Continue free water per 2  Hyperglycemia without an underlying diagnosis of diabetes mellitus -Likely related to acute stress -CBGs while on tube feedings controlled with sliding scale insulin and high-dose Levemir insulin BID CBGs well-controlled at this juncture -Consider change to Glucerna tube feeding Levemir dosage can be decreased to minimize risk for unintended hypoglycemia -Hemoglobin A1c 4.7   Acute hypoxemic respiratory failure -Resolved -Stable on room air with O2 sats between 97 and 99%   Data Reviewed: Basic Metabolic Panel: Recent Labs  Lab 08/19/20 0157  NA 134*  K 4.0  CL 99  CO2 27  GLUCOSE 203*  BUN 23*  CREATININE 1.29*  CALCIUM 9.0   Liver Function Tests: No results for input(s): AST, ALT, ALKPHOS, BILITOT, PROT, ALBUMIN in the last 168 hours. No results for input(s): LIPASE, AMYLASE in the last 168 hours. No results for input(s): AMMONIA in the last 168 hours. CBC: Recent Labs  Lab 08/17/20 0255 08/19/20 0157  WBC 7.6 6.9  HGB 9.2* 9.6*  HCT 30.1* 29.5*  MCV 108.7* 104.6*  PLT 209 200   Cardiac Enzymes: No results for input(s): CKTOTAL, CKMB, CKMBINDEX, TROPONINI in the last 168 hours. BNP (last 3 results) No results for  input(s): BNP in the last 8760 hours.  ProBNP (last 3 results) No results for input(s): PROBNP in the last 8760 hours.  CBG: Recent Labs  Lab 08/21/20 1108 08/21/20 1606 08/21/20 2124 08/22/20 0538 08/22/20 0714  GLUCAP 150* 160* 111* 148* 148*    No results found for this or any previous visit (from the past 240 hour(s)).   Studies: No results found.  Scheduled Meds: . amantadine  100 mg Oral Daily  . aspirin  81 mg Per Tube Daily  . atorvastatin  80 mg Per Tube Daily  . bethanechol  10 mg Per Tube TID  . carvedilol  25 mg Per Tube BID WC  . chlorhexidine gluconate (MEDLINE KIT)  15 mL Mouth Rinse BID  . feeding supplement (PROSource TF)  45 mL Per Tube BID  . free water  200 mL Per Tube Q3H  . heparin  5,000 Units Subcutaneous Q8H  . insulin aspart  0-9 Units Subcutaneous Q4H  . insulin detemir  30 Units Subcutaneous BID  . isosorbide-hydrALAZINE  1 tablet Oral TID  . mouth rinse  15 mL Mouth Rinse BID  . polyvinyl alcohol  1 drop Both Eyes QID  . QUEtiapine  25 mg Oral QHS  . sodium chloride flush  3 mL Intravenous Q12H  . ticagrelor  90 mg Per Tube BID   Continuous Infusions: . sodium chloride 250 mL (08/17/20 0812)  . feeding supplement (JEVITY 1.5 CAL/FIBER) 65 mL/hr at 08/18/20 8864    Active Problems:   Acute ST elevation myocardial infarction (STEMI) [facility])   Cardiac arrest (HCC)   Elevated blood pressure reading with diagnosis of hypertension   Shock circulatory (HCC)   Encephalopathy   History of ETT   Cardiomyopathy, ischemic   Anoxic brain damage [facility])   Dysphagia   Consultants:  Cardiology  Interventional radiology  PCCM  Neurology  Procedures:  Cardiac catheterization 12/4  Continuous EEG 12/4 and overnight EEG with video 12/5  Echocardiogram 12/5  EEG 12/9  Percutaneous PEG tube placement 12/30  Antibiotics:     Time spent: 20 minutes    Erin Hearing ANP  [facility] 7 am - 330 pm/M-F 31  days

## 2020-08-23 LAB — GLUCOSE, CAPILLARY
Glucose-Capillary: 151 mg/dL — ABNORMAL HIGH (ref 70–99)
Glucose-Capillary: 117 mg/dL — ABNORMAL HIGH (ref 70–99)
Glucose-Capillary: 93 mg/dL (ref 70–99)
Glucose-Capillary: 138 mg/dL — ABNORMAL HIGH (ref 70–99)
Glucose-Capillary: 133 mg/dL — ABNORMAL HIGH (ref 70–99)
Glucose-Capillary: 133 mg/dL — ABNORMAL HIGH (ref 70–99)

## 2020-08-23 MED ORDER — QUETIAPINE FUMARATE 25 MG PO TABS
25.0000 mg | ORAL_TABLET | Freq: Every day | ORAL | Status: DC
  Administered 2020-08-23 – 2020-09-03 (×12): 25 mg
  Filled 2020-08-23 (×12): qty 1

## 2020-08-23 MED ORDER — AMANTADINE HCL 50 MG/5ML PO SOLN
100.0000 mg | Freq: Every day | ORAL | Status: DC
  Administered 2020-08-23 – 2020-10-18 (×57): 100 mg
  Filled 2020-08-23 (×57): qty 10

## 2020-08-23 MED ORDER — ISOSORB DINITRATE-HYDRALAZINE 20-37.5 MG PO TABS
1.0000 | ORAL_TABLET | Freq: Three times a day (TID) | ORAL | Status: DC
  Administered 2020-08-23 – 2020-09-19 (×83): 1
  Filled 2020-08-23 (×83): qty 1

## 2020-08-23 NOTE — Progress Notes (Signed)
  Speech Language Pathology Treatment: Dysphagia  Patient Details Name: Antonio Navarro MRN: [id number] DOB: 1960-09-09 Today's Date: 08/23/2020 Time: 6962-9528 SLP Time Calculation (min) (ACUTE ONLY): 18 min  Assessment / Plan / Recommendation Clinical Impression  Pt was seen for ongoing PO trials today, and drank over 4 ounces of thin liquids with no overt s/s of aspiration! He continues to orally hold and spit out bites of puree, but his oral transit appears to be much more fluent with thin liquids today. He still needs assistance for initiation at times, for which he also benefited from self-feeding (with assistance). Will initiate thin liquid diet (any thin liquids) and f/u for tolerance and readiness to progress to solids as he will swallow them.    HPI HPI: Pt is a 60 yo male s/p arrest with downtime 9 minutes. Initial rhythm was V. fib, shocked and received 6 rounds of epi before ROSC achieved.  Total CPR time was 30 minutes. ETT 12/4-12/13. MRI 12/7 suggestive of a widespread anoxic injury. PMH includes kidney stones and HTN.      SLP Plan  Continue with current plan of care       Recommendations  Diet recommendations: Thin liquid Liquids provided via: Cup;Straw Medication Administration: Via alternative means Supervision: Staff to assist with self feeding;Full supervision/cueing for compensatory strategies Postural Changes and/or Swallow Maneuvers: Seated upright 90 degrees                Oral Care Recommendations: Oral care QID Follow up Recommendations: Inpatient Rehab SLP Visit Diagnosis: Dysphagia, unspecified (R13.10) Plan: Continue with current plan of care       GO                Mahala Menghini., M.A. CCC-SLP Acute Rehabilitation Services Pager [phone] Office [phone]  08/23/2020, 2:44 PM

## 2020-08-23 NOTE — Progress Notes (Signed)
PT TREATMENT  Pt seen for additional session to assist back to bed. Pt able to tolerate > 1 hour sitting up in the chair. Requiring two person min-mod assist for stand pivot transfer. Continue to highly recommend CIR to address deficits, maximize functional mobility and decrease caregiver burden.   Wyona Almas, PT, DPT Acute Rehabilitation Services Pager [phone] Office [phone]   08/23/20 1034  PT Visit Information  Last PT Received On 08/23/20  Assistance Needed +2  History of Present Illness 60 y.o. male with history of tobacco abuse, obesity, HTN who was admitted 07/22/20 post cardiac arrest with inferolateral STEMI. He was resuscitated in the field by EMS after being found to be in V fib. Approximately 30 minutes of CPR. He was intubated on arrival.  Subjective Data  Patient Stated Goal pt fiance would like him to get stronger  Precautions  Precautions Fall  Precaution Comments mitts, gtube  Restrictions  Weight Bearing Restrictions No  Pain Assessment  Pain Assessment Faces  Faces Pain Scale 4  Pain Location bottom with peri care  Pain Descriptors / Indicators Grimacing;Guarding;Moaning  Pain Intervention(s) Monitored during session  Cognition  Arousal/Alertness Awake/alert  Behavior During Therapy WFL for tasks assessed/performed  Overall Cognitive Status Impaired/Different from baseline  Area of Impairment Orientation;Attention;Following commands;Safety/judgement;Awareness;Problem solving  Orientation Level Disoriented to;Place;Situation;Time  Current Attention Level Sustained  Following Commands Follows one step commands inconsistently  Safety/Judgement Decreased awareness of safety;Decreased awareness of deficits  Awareness Intellectual  Problem Solving Slow processing;Difficulty sequencing;Requires tactile cues;Requires verbal cues;Decreased initiation  General Comments pt seems to follow motor commands better than sequencing ADL tasks, pt nodding  appropriately and does state "yes" and "no" appropriately. pt requires tactile cues at times to initiate tasks  Bed Mobility  Overal bed mobility Needs Assistance  Bed Mobility Rolling;Sit to Sidelying  Rolling Max assist;+2 for physical assistance  Sit to sidelying Max assist;+2 for physical assistance  General bed mobility comments Assist for initiation and execution. max A +2 to assist back to supine, and rolling towards right for peri care  Transfers  Overall transfer level Needs assistance  Equipment used 2 person hand held assist  Transfers Sit to/from [employer] Transfers  Sit to Stand Min assist;+2 physical assistance  Stand pivot transfers Mod assist;+2 physical assistance  General transfer comment MinA + 2 to power into standing from recliner, modA + 2 for pivot towards bed  Balance  Overall balance assessment Needs assistance  Sitting-balance support Bilateral upper extremity supported;Feet supported  Sitting balance-Leahy Scale Poor  Sitting balance - Comments at least min guard for safety as pt with decreased trunk stability  Standing balance support Bilateral upper extremity supported;During functional activity  Standing balance-Leahy Scale Poor  Standing balance comment reliant on external support  PT - End of Session  Equipment Utilized During Treatment Gait belt  Activity Tolerance Patient tolerated treatment well  Patient left with call bell/phone within reach;in bed;with bed alarm set  Nurse Communication Mobility status   PT - Assessment/Plan  PT Plan Current plan remains appropriate  PT Visit Diagnosis Other abnormalities of gait and mobility (R26.89)  PT Frequency (ACUTE ONLY) Min 3X/week  Follow Up Recommendations CIR  PT equipment Other (comment) (TBA)  AM-PAC PT "6 Clicks" Mobility Outcome Measure (Version 2)  Help needed turning from your back to your side while in a flat bed without using bedrails? 1  Help needed moving from lying on your back to  sitting on the side of a flat bed without using  bedrails? 1  Help needed moving to and from a bed to a chair (including a wheelchair)? 2  Help needed standing up from a chair using your arms (e.g., wheelchair or bedside chair)? 3  Help needed to walk in hospital room? 1  Help needed climbing 3-5 steps with a railing?  1  6 Click Score 9  Consider Recommendation of Discharge To: CIR/SNF/LTACH  PT Goal Progression  Progress towards PT goals Progressing toward goals  Acute Rehab PT Goals  Potential to Achieve Goals Fair  PT Time Calculation  PT Start Time (ACUTE ONLY) 1000  PT Stop Time (ACUTE ONLY) 1017  PT Time Calculation (min) (ACUTE ONLY) 17 min  PT General Charges  $$ ACUTE PT VISIT 1 Visit  PT Treatments  $Therapeutic Activity 8-22 mins

## 2020-08-23 NOTE — Progress Notes (Addendum)
Occupational Therapy Treatment Patient Details Name: Antonio Navarro MRN: [id number] DOB: Aug 10, 1961 Today's Date: 08/23/2020    History of present illness 60 y.o. male with history of tobacco abuse, obesity, HTN who was admitted 07/22/20 post cardiac arrest with inferolateral STEMI. He was resuscitated in the field by EMS after being found to be in V fib. Approximately 30 minutes of CPR. He was intubated on arrival.   OT comments  Pt seen in conjunction with PT to maximize pts activity tolerance. Pt making good progress towards OT goals this session. Pt continues to present with decreased activity tolerance, cognitive deficits, impaired motor planning and impaired balance impacting pts ability to complete BADLs independently. Pt able to progress OOB to recliner with MOD A +2, pt completed x2 sit<>stands from EOB and recliner with MIN A +2. Pt following one step commands inconsistently but seems to respond best to one step motor commands with pt still presenting with difficulties sequencing ADL tasks despite max mulitmodal cues. Updated DC recs to CIR as pt with more alert and making progress towards mobility goals. Will let OTR know about change in POC.  Will continue to follow acutely per POC.   Follow Up Recommendations  CIR    Equipment Recommendations  Wheelchair (measurements OT);Wheelchair cushion (measurements OT);Hospital bed;3 in 1 bedside commode    Recommendations for Other Services      Precautions / Restrictions Precautions Precautions: Fall Precaution Comments: mitts, gtube Restrictions Weight Bearing Restrictions: No       Mobility Bed Mobility Overal bed mobility: Needs Assistance Bed Mobility: Rolling;Sidelying to Sit Rolling: Max assist;+2 for physical assistance Sidelying to sit: Max assist;+2 for physical assistance       General bed mobility comments: max A +2 to roll to pts R side and to elevate trunk into sitting  Transfers Overall transfer level: Needs  assistance Equipment used: 2 person hand held assist Transfers: Stand Pivot Transfers;Sit to/from Stand Sit to Stand: Min assist;+2 physical assistance Stand pivot transfers: Mod assist;+2 physical assistance       General transfer comment: pt able to complete x2 sit<>stands from EOB and recliner with MIN A +2 to power into standing and for initial steadying assist, MOD A +2 to pivot to recliner to pts R side, needing cues for sequencing and to initiate pivotal steps    Balance Overall balance assessment: Needs assistance Sitting-balance support: Bilateral upper extremity supported;Feet supported Sitting balance-Leahy Scale: Poor Sitting balance - Comments: at least min guard for safety as pt with decreased trunk stability   Standing balance support: Bilateral upper extremity supported;During functional activity Standing balance-Leahy Scale: Poor Standing balance comment: reliant on external support                           ADL either performed or assessed with clinical judgement   ADL Overall ADL's : Needs assistance/impaired     Grooming: Wash/dry face;Wash/dry hands;Sitting;Cueing for safety;Cueing for sequencing;Total assistance Grooming Details (indicate cue type and reason): pt requried total hand over hand assist to complete task for thoroughness, pt initially initating task with tactile cues but then terminates task unexpectedly     Lower Body Bathing: Bed level;Total assistance Lower Body Bathing Details (indicate cue type and reason): d/t incontinent BM Upper Body Dressing : Maximal assistance;Sitting Upper Body Dressing Details (indicate cue type and reason): to don gown     Toilet Transfer: +2 for physical assistance;Moderate assistance Toilet Transfer Details (indicate cue type and reason):  simulated via stand pivot transfer from EOB>recliner with MOD A+2 HHA Toileting- Clothing Manipulation and Hygiene: Total assistance;Bed level Toileting - Clothing  Manipulation Details (indicate cue type and reason): posterior pericare from bed level     Functional mobility during ADLs: Moderate assistance;+2 for physical assistance (stand pivot transfer) General ADL Comments: pt making good progress, following commands related to motor tasks nore vs. functional tasks related to ADLs.     Vision  difficult to assess vision secondary to cognitive deficits although noted to undershoot when reaching for wash cloth     Perception  pt noted to constantly fidget while sitting EOB with no awareness into deficits and no righting reactions noted. Pt required cues for spatial awareness in terms of mobility as attempting to sit prematurely.    Praxis  impaired motor planning as pt needing cues to sequence all motor commands. Question some ideational apraxia, and ideomotor apraxia as pt unable to motor plan how to interact with hair brush and when given choices of what brush is used for pt unable to problem solve.     Cognition Arousal/Alertness: Awake/alert Behavior During Therapy: WFL for tasks assessed/performed Overall Cognitive Status: Impaired/Different from baseline Area of Impairment: Orientation;Attention;Following commands;Safety/judgement;Awareness;Problem solving                 Orientation Level: Disoriented to;Place;Situation;Time Current Attention Level: Sustained   Following Commands: Follows one step commands inconsistently Safety/Judgement: Decreased awareness of safety;Decreased awareness of deficits Awareness: Intellectual Problem Solving: Slow processing;Difficulty sequencing;Requires tactile cues;Requires verbal cues;Decreased initiation General Comments: pt seems to follow motor commands better than sequencing ADL tasks, pt nodding appropriately and does state "yes" and "no" appropriately. pt requires tactile cues at times to initiate tasks        Exercises     Shoulder Instructions       General Comments       Pertinent Vitals/ Pain       Pain Assessment: Faces Faces Pain Scale: Hurts little more Pain Location: bottom with peri care Pain Descriptors / Indicators: Grimacing;Guarding;Moaning Pain Intervention(s): Limited activity within patient's tolerance;Monitored during session;Repositioned  Home Living                                          Prior Functioning/Environment              Frequency  Min 2X/week        Progress Toward Goals  OT Goals(current goals can now be found in the care plan section)  Progress towards OT goals: Progressing toward goals  Acute Rehab OT Goals Patient Stated Goal: pt fiance would like him to get stronger OT Goal Formulation: With family Time For Goal Achievement: 08/30/20 Potential to Achieve Goals: [place] Discharge plan remains appropriate;Frequency remains appropriate    Co-evaluation      Reason for Co-Treatment: For patient/therapist safety;Necessary to address cognition/behavior during functional activity;To address functional/ADL transfers   OT goals addressed during session: ADL's and self-care      AM-PAC OT "6 Clicks" Daily Activity     Outcome Measure   Help from another person eating meals?: A Lot Help from another person taking care of personal grooming?: A Lot Help from another person toileting, which includes using toliet, bedpan, or urinal?: Total Help from another person bathing (including washing, rinsing, drying)?: Total Help from another person to put on and taking off regular  upper body clothing?: A Lot Help from another person to put on and taking off regular lower body clothing?: Total 6 Click Score: 9    End of Session Equipment Utilized During Treatment: Gait belt  OT Visit Diagnosis: Muscle weakness (generalized) (M62.81);Pain;Other abnormalities of gait and mobility (R26.89);Cognitive communication deficit (R41.841);Low vision, both eyes (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

## 2020-08-23 NOTE — Progress Notes (Signed)
Nutrition Follow-up  DOCUMENTATION CODES:   Not applicable  INTERVENTION:   ContinueJevity1.5@ 75m/hr via PEG  427mProsource TF BID   200 ml free water flush every 3 hours  Tube feeding regimen provides2420kcal (100% of needs),122grams of protein, and 118682mf H2O. Total free water: 2786m88maily  NUTRITION DIAGNOSIS:   Inadequate oral intake related to inability to eat as evidenced by NPO status.  Ongoing  GOAL:   Patient will meet greater than or equal to 90% of their needs  Met with TF  MONITOR:   Diet advancement,Labs,Weight trends,TF tolerance,Skin,I & O's  REASON FOR ASSESSMENT:   Consult,Ventilator Enteral/tube feeding initiation and management  ASSESSMENT:   Patient with PMH significant for HTN and kidney stones. Presents this admission s/p cardiac arrest.  12/13- NGT d/c 12/15- cortrak placed, tip of tube in the stomach 12/17- s/p BSE- recommend continue NPO 12/19- pt pulled cortrak tube, replaced- placement verified by x-ray (stomach) 12/20- s/p BSE- pt refusing PO trials 12/30- cortrak removed, PEG placed  Reviewed I/O's: +680 ml x 24 hours and +1.5 L since 08/09/20  UOP: 1.6 L x 24 hours  Pt remains NPO and dependent on TF via PEG. Osmolite 1.5 infusing via PEG at 65 ml/hr. Pt also receiving 45ml42msource TF BIDand200 ml free water flush every 3 hours.Tube feeding regimen provides2420kcal (100% of needs),120grams of protein, and 1189ml 17m2O. Total free water: 2789 ml/ daily.  SLP continues efforts, however, has historically refused offers of PO's or spits out food.   Per TOC notes, pt for SNF placement at discharge. Family working on medicaInsurance underwriterbs reviewed: CBGS: [id number] (inpatient orders for glycemic control are 0-9 units insulin aspart every 4 hours and 30 units insulin detemir BID).   Diet Order:   Diet Order    None      EDUCATION NEEDS:   Not appropriate for education at this  time  Skin:  Skin Assessment: Skin Integrity Issues: Skin Integrity Issues:: Other (Comment) Other: MASD to buttocks, skin tear to rt upper back  Last BM:  08/22/20  Height:   Ht Readings from Last 1 Encounters:  07/22/20 6' (1.829 m)    Weight:   Wt Readings from Last 1 Encounters:  08/23/20 87.4 kg    Ideal Body Weight:  80.9 kg  BMI:  Body mass index is 26.13 kg/m.  Estimated Nutritional Needs:   Kcal:  2300-2500 kcal  Protein:  115-130 grams  Fluid:  >/= 2 L/day    JenifeLoistine ChanceLDN, CDCES [place] Dietitian II Certified Diabetes Care and Education Specialist Please refer to [facility] and/or RD on-call/weekend/after hours pager

## 2020-08-23 NOTE — Progress Notes (Signed)
Inpatient Rehabilitation Admissions Coordinator   Inpatient rehab consult received. I met at bedside with patient and his sister [name]. I discussed goals and expectations of a possible Cir admit pending caregiver support available after a CIR admit. I also contacted his son, [name], by phone to discuss the same. [name] is to discuss with his 2 Aunts and pt's fiance and then contact me with caregiver support arrangements. I will follow up tomorrow. I feel patient is a good candidate for Cir admit pending caregiver support availability.  Danne Baxter, RN, MSN Rehab Admissions Coordinator [phone] 08/23/2020 1:50 PM

## 2020-08-23 NOTE — Evaluation (Signed)
Speech Language Pathology Evaluation Patient Details Name: Antonio Navarro MRN: [id number] DOB: 09/17/1960 Today's Date: 08/23/2020 Time: 4854-6270 SLP Time Calculation (min) (ACUTE ONLY): 19 min  Problem List:  Patient Active Problem List   Diagnosis Date Noted  . DNR (do not resuscitate) discussion   . Anoxic brain damage (HCC)   . Dysphagia   . Cardiomyopathy, ischemic   . History of ETT   . Encephalopathy   . Acute ST elevation myocardial infarction (STEMI) (HCC)   . Cardiac arrest (HCC)   . Elevated blood pressure reading with diagnosis of hypertension   . Shock circulatory [facility])    Past Medical History:  Past Medical History:  Diagnosis Date  . Hypertension   . Kidney stones    Past Surgical History:  Past Surgical History:  Procedure Laterality Date  . CORONARY/GRAFT ACUTE MI REVASCULARIZATION N/A 07/22/2020   Procedure: Coronary/Graft Acute MI Revascularization;  Surgeon: Runell Gess, MD;  Location: [facility] INVASIVE CV LAB;  Service: Cardiovascular;  Laterality: N/A;  . IR GASTROSTOMY TUBE MOD SED  08/17/2020  . LEFT HEART CATH AND CORONARY ANGIOGRAPHY N/A 07/22/2020   Procedure: LEFT HEART CATH AND CORONARY ANGIOGRAPHY;  Surgeon: Runell Gess, MD;  Location: [facility] INVASIVE CV LAB;  Service: Cardiovascular;  Laterality: N/A;   HPI:  Pt is a 60 yo male s/p arrest with downtime 9 minutes. Initial rhythm was V. fib, shocked and received 6 rounds of epi before ROSC achieved.  Total CPR time was 30 minutes. ETT 12/4-12/13. MRI 12/7 suggestive of a widespread anoxic injury. PMH includes kidney stones and HTN.   Assessment / Plan / Recommendation Clinical Impression  Pt has increased verbal production compared to last week, with dysarthric output at the word to short phrase level. He does not repeat single words or perform automatic speech tasks, such as counting. He follows one-step commands intermittently with Mod cues. His performance overall appears to be impacted by his  decreased sustained attention and initiation. He does show some efforts at simple, functional problem solving during familiar tasks. Pt would be a great candidate for CIR to maximize cognitive and communicative function.    SLP Assessment  SLP Recommendation/Assessment: Patient needs continued Speech Lanaguage Pathology Services SLP Visit Diagnosis: Cognitive communication deficit (R41.841)    Follow Up Recommendations  Inpatient Rehab    Frequency and Duration min 2x/week  2 weeks      SLP Evaluation Cognition  Overall Cognitive Status: Impaired/Different from baseline Arousal/Alertness: Awake/alert Attention: Sustained Sustained Attention: Impaired Sustained Attention Impairment: Verbal basic;Functional basic Problem Solving: Impaired Problem Solving Impairment: Verbal basic;Functional basic       Comprehension  Auditory Comprehension Overall Auditory Comprehension: Impaired Commands: Impaired One Step Basic Commands: 50-74% accurate Interfering Components: Attention (initiation)    Expression Expression Primary Mode of Expression: Verbal Verbal Expression Overall Verbal Expression: Impaired Automatic Speech: Social Response (does not count) Level of Generative/Spontaneous Verbalization: Phrase Repetition: Impaired Level of Impairment: Word level Pragmatics: Impairment Impairments: Eye contact Interfering Components: Attention Non-Verbal Means of Communication: Gestures   Oral / Motor  Motor Speech Overall Motor Speech: Impaired Respiration: Within functional limits Phonation: Normal Articulation: Impaired Level of Impairment: Word Intelligibility: Intelligibility reduced Word: 50-74% accurate   GO                    Mahala Menghini., M.A. CCC-SLP Acute Rehabilitation Services Pager [phone] Office [phone]  08/23/2020, 3:04 PM

## 2020-08-23 NOTE — Progress Notes (Signed)
Physical Therapy Treatment Patient Details Name: Antonio Navarro MRN: [id number] DOB: 03/01/1961 Today's Date: 08/23/2020    History of Present Illness 60 y.o. male with history of tobacco abuse, obesity, HTN who was admitted 07/22/20 post cardiac arrest with inferolateral STEMI. He was resuscitated in the field by EMS after being found to be in V fib. Approximately 30 minutes of CPR. He was intubated on arrival.    PT Comments    Pt demonstrating improved activity tolerance and mobility. Requiring varying levels of assist due to cognition, but ultimately able to stand multiple times with two person minimal assist and pivot to chair with two person moderate assist. Seems to respond best to one step motor commands; demonstrating difficulty with sequencing ADL tasks such as washing/drying hands and brushing hair. Continue to recommend comprehensive inpatient rehab (CIR) for post-acute therapy needs.    Follow Up Recommendations  CIR     Equipment Recommendations  Other (comment) (TBA)    Recommendations for Other Services       Precautions / Restrictions Precautions Precautions: Fall Precaution Comments: mitts, gtube Restrictions Weight Bearing Restrictions: No    Mobility  Bed Mobility Overal bed mobility: Needs Assistance Bed Mobility: Rolling;Sidelying to Sit Rolling: Max assist;+2 for physical assistance Sidelying to sit: Max assist;+2 for physical assistance       General bed mobility comments: Assist for initiation and execution. max A +2 to roll to pts R side and to elevate trunk into sitting.  Transfers Overall transfer level: Needs assistance Equipment used: 2 person hand held assist Transfers: Sit to/from [employer] Sit to Stand: Min assist;+2 physical assistance Stand pivot transfers: Mod assist;+2 physical assistance       General transfer comment: pt able to complete x2 sit<>stands from EOB and recliner with MIN A +2 to power into standing  and for initial steadying assist, MOD A +2 to pivot to recliner to pts R side, needing cues for sequencing and to initiate pivotal steps  Ambulation/Gait                 Stairs             Wheelchair Mobility    Modified Rankin (Stroke Patients Only)       Balance Overall balance assessment: Needs assistance Sitting-balance support: Bilateral upper extremity supported;Feet supported Sitting balance-Leahy Scale: Poor Sitting balance - Comments: at least min guard for safety as pt with decreased trunk stability   Standing balance support: Bilateral upper extremity supported;During functional activity Standing balance-Leahy Scale: Poor Standing balance comment: reliant on external support                            Cognition Arousal/Alertness: Awake/alert Behavior During Therapy: WFL for tasks assessed/performed Overall Cognitive Status: Impaired/Different from baseline Area of Impairment: Orientation;Attention;Following commands;Safety/judgement;Awareness;Problem solving                 Orientation Level: Disoriented to;Place;Situation;Time Current Attention Level: Sustained   Following Commands: Follows one step commands inconsistently Safety/Judgement: Decreased awareness of safety;Decreased awareness of deficits Awareness: Intellectual Problem Solving: Slow processing;Difficulty sequencing;Requires tactile cues;Requires verbal cues;Decreased initiation General Comments: pt seems to follow motor commands better than sequencing ADL tasks, pt nodding appropriately and does state "yes" and "no" appropriately. pt requires tactile cues at times to initiate tasks      Exercises      General Comments        Pertinent Vitals/Pain Pain Assessment:  Faces Faces Pain Scale: Hurts little more Pain Location: bottom with peri care Pain Descriptors / Indicators: Grimacing;Guarding;Moaning Pain Intervention(s): Monitored during session    Home  Living                      Prior Function            PT Goals (current goals can now be found in the care plan section) Acute Rehab PT Goals Patient Stated Goal: pt fiance would like him to get stronger Potential to Achieve Goals: Fair Progress towards PT goals: Progressing toward goals    Frequency    Min 3X/week      PT Plan Current plan remains appropriate    Co-evaluation PT/OT/SLP Co-Evaluation/Treatment: Yes Reason for Co-Treatment: Complexity of the patient's impairments (multi-system involvement);Necessary to address cognition/behavior during functional activity;To address functional/ADL transfers;For patient/therapist safety PT goals addressed during session: Mobility/safety with mobility OT goals addressed during session: ADL's and self-care      AM-PAC PT "6 Clicks" Mobility   Outcome Measure  Help needed turning from your back to your side while in a flat bed without using bedrails?: Total Help needed moving from lying on your back to sitting on the side of a flat bed without using bedrails?: Total Help needed moving to and from a bed to a chair (including a wheelchair)?: A Lot Help needed standing up from a chair using your arms (e.g., wheelchair or bedside chair)?: A Little Help needed to walk in hospital room?: Total Help needed climbing 3-5 steps with a railing? : Total 6 Click Score: 9    End of Session Equipment Utilized During Treatment: Gait belt Activity Tolerance: Patient tolerated treatment well Patient left: in chair;with call bell/phone within reach;with chair alarm set;Other (comment) (posey chair alarm belt) Nurse Communication: Mobility status PT Visit Diagnosis: Other abnormalities of gait and mobility (R26.89)     Time: 9735-3299 PT Time Calculation (min) (ACUTE ONLY): 32 min  Charges:  $Therapeutic Activity: 8-22 mins                     Lillia Pauls, PT, DPT Acute Rehabilitation Services Pager [phone] Office  [phone]    Norval Morton 08/23/2020, 10:29 AM

## 2020-08-23 NOTE — Progress Notes (Signed)
[facility] PROGRESS NOTE  Antonio Navarro OIN:[id number] DOB: May 22, 1961 DOA: 07/22/2020 PCP: Patient, No Pcp Per  Status: Remains inpatient appropriate because:Altered mental status and Unsafe d/c plan   Dispo: The patient is from: Home              Anticipated d/c is to: SNF              Anticipated d/c date is: > 3 days              Patient currently is medically stable to d/c.  Barriers to discharge: Needs long-term SNF.  Medicaid and disability pending.  Patient has been evaluated by [facility] but had not been appropriate for aggressive rehabilitative therapies.  Of 1/3 patient has woken up and was participating with PT therefore CIR asked to come by and reevaluate patient   Code Status: Full Family Communication: Attending physician spoke with son on 12/30 DVT prophylaxis: sq heparin Vaccination status: Completed both doses of Pfizer Covid vaccination on 12/14/2019  Foley catheter: No  HPI: 60 year old male history of hypertension prescribed Norvasc prior to admission, chronic back pain, prior renal calculi who experienced an out of hospital cardiac arrest with downtime of 9 minutes.  Initial rhythm was ventricular fibrillation.  He was defibrillated deceived 6 doses of epi before ROSC.  Subsequent work-up revealed STEMI.  He underwent cardiac catheterization with a DES placed to the circumflex.  Cardiology following and has placed patient on Brilinta and aspirin and is also being treated with beta-blocker and statins.  From a neurological standpoint he clinically has anoxic brain injury.  During this hospitalization he experienced AKI and was treated with fluids and correction of hypoperfusion from cardiac arrest.  Echo performed this admission revealed ischemic cardiomyopathy with an EF of 40%  In addition to above patient has been unable to pass swallowing evaluations and subsequently has undergone percutaneous PEG tube placement on 12/30.  Patient is awake and moving all extremities  spontaneously but does not follow commands and does not verbalize palliative care has met with patient and family and has emphasized poor prognosis and will continue to meet with the family during the hospitalization.  Current plan is to transition to SNF bed once available.  Expect need for lifelong 24/7 care for all ADLs.  Subjective: Patient appears tired.  PT reports that patient recently back to bed after tolerating greater than 1 hour sitting up in the chair.  Patient was able to weakly follow some commands with his extremities, tell us his name and he was able to tell us he was in the hospital.  Objective: Vitals:   08/22/20 1936 08/23/20 0533  BP: 103/76 122/84  Pulse: 77 74  Resp: 18 18  Temp: 98.4 F (36.9 C)   SpO2: 100%     Intake/Output Summary (Last 24 hours) at 08/23/2020 1116 Last data filed at 08/23/2020 1102 Gross per 24 hour  Intake 2230 ml  Output 1950 ml  Net 280 ml   Filed Weights   08/21/20 0407 08/22/20 0542 08/23/20 0029  Weight: 89.3 kg 90.9 kg 87.4 kg    Exam:  Constitutional: Awake, calm, no acute distress Respiratory: Bilateral lung sounds are clear, room air, no increased work of breathing at rest Cardiovascular: Normal S1-S2 without any murmurs, no peripheral edema, pulses regular Abdomen: Normoactive bowel sounds, soft and nontender.  PEG right unremarkable Musculoskeletal: No contractures. Normal muscle tone and no hypertonicity or spasticity observed.  Neurologic: CN 2-12 grossly intact.  Moves all  extremities equally.  Strength appears to be 3+-4/5 in both upper and lower extremities.  Inconsistently following simple commands the patient is also fatigued from recent PT session Psychiatric: Awake and oriented to name and place.  Flat affect.  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 -As of 1/3 patient is much more alert maintaining eye contact and attempting to speak in monosyllabic phrases and after having an episode of insomnia was more alert and participative as of 1/5 -Anticipate will need lifelong 24/7 care at a skilled nursing facility -No hypertonicity or spasticity observed -No seizure activity since onset of anoxic brain injury -Continue Seroquel 25 mg at HS in regards to reports of insomnia -Continue daytime Symmetrel to help contain daytime alertness in context of brain injury  Hypertension -Was on Norvasc prior to admission -Continue cardiovascular meds as listed above  Nonsustained VT -Continue amiodarone and beta-blocker -Cardiology managing  Acute on chronic kidney disease stage IIIb -Suspect prerenal etiology in the setting of hypoperfusion from ventricular fibrillation arrest -Baseline creatinine between 1.6 and 2; creatinine on 12/19 was 1.67-will repeat in a.m. on 08/19/2020 -Continue free water per tube and intermittently follow labs  Dysphagia, moderate protein calorie malnutrition Nutrition Problem: Inadequate oral intake Etiology: inability to eat Signs/Symptoms: NPO status Interventions: Tube feeding,Prostat Estimated body mass index is 26.13 kg/m as calculated from the following:   Height as of this encounter: 6' (1.829 m).   Weight as of this encounter: 87.4 kg.  Skin tear right lateral back Wound / Incision (Open or Dehisced) 07/28/20 Skin tear Back Lateral;Right;Upper (Active)  Date First Assessed/Time  First Assessed: 07/28/20 0300   Wound Type: Skin tear  Location: Back  Location Orientation: Lateral;Right;Upper  Present on Admission: No    Assessments 07/28/2020  7:49 PM 08/23/2020  8:00 AM  Dressing Type None Foam - Lift dressing to assess site every shift  Dressing Status -- Clean;Dry;Intact  Dressing Change Frequency -- PRN  Site / Wound Assessment -- Clean;Dry  Drainage Amount None --  Treatment Cleansed Other (Comment)     No Linked orders to display     Wound / Incision (Open or Dehisced) 08/04/20 (MASD) Moisture Associated Skin Damage Buttocks Right;Left (Active)  Date First Assessed/Time First Assessed: 08/04/20 0800   Wound Type: (MASD) Moisture Associated Skin Damage  Location: Buttocks  Location Orientation: Right;Left    Assessments 08/04/2020  8:16 AM 08/19/2020  8:00 AM  Dressing Type Foam - Lift dressing to assess site every shift Foam - Lift dressing to assess site every shift  Dressing Changed New --  Dressing Status Clean;Dry;Intact --  Dressing Change Frequency PRN --  Treatment Cleansed --     No Linked orders to display    Other problems: Acute hypernatremia -Continue free water per 2  Hyperglycemia without an underlying diagnosis of diabetes mellitus -Likely related to acute stress -CBGs while on tube feedings controlled with sliding scale insulin and high-dose Levemir insulin BID CBGs well-controlled at this juncture -Consider change to Glucerna tube feeding  Levemir dosage can be decreased to minimize risk for unintended hypoglycemia -Hemoglobin A1c 4.7   Acute hypoxemic respiratory failure -Resolved -Stable on room air with O2 sats between 97 and 99%   Data Reviewed: Basic Metabolic Panel: Recent Labs  Lab 08/19/20 0157  NA 134*  K 4.0  CL 99  CO2 27  GLUCOSE 203*  BUN 23*  CREATININE 1.29*  CALCIUM 9.0   Liver Function Tests: No results for input(s): AST, ALT, ALKPHOS, BILITOT, PROT, ALBUMIN in the last 168 hours. No results for  input(s): LIPASE, AMYLASE in the last 168 hours. No results for input(s): AMMONIA in the last 168 hours. CBC: Recent Labs  Lab 08/17/20 0255 08/19/20 0157  WBC 7.6 6.9  HGB 9.2* 9.6*  HCT 30.1* 29.5*  MCV 108.7* 104.6*  PLT 209 200   Cardiac Enzymes: No results for input(s): CKTOTAL, CKMB, CKMBINDEX, TROPONINI in the last 168 hours. BNP (last 3 results) No results for input(s): BNP in the last 8760 hours.  ProBNP (last 3 results) No results for input(s): PROBNP in the last 8760 hours.  CBG: Recent Labs  Lab 08/22/20 1618 08/22/20 2038 08/23/20 0112 08/23/20 0406 08/23/20 0745  GLUCAP 108* 128* 151* 117* 93    No results found for this or any previous visit (from the past 240 hour(s)).   Studies: No results found.  Scheduled Meds:  amantadine  100 mg Per Tube Daily   aspirin  81 mg Per Tube Daily   atorvastatin  80 mg Per Tube Daily   bethanechol  10 mg Per Tube TID   carvedilol  25 mg Per Tube BID WC   chlorhexidine gluconate (MEDLINE KIT)  15 mL Mouth Rinse BID   feeding supplement (PROSource TF)  45 mL Per Tube BID   free water  200 mL Per Tube Q3H   heparin  5,000 Units Subcutaneous Q8H   insulin aspart  0-9 Units Subcutaneous Q4H   insulin detemir  30 Units Subcutaneous BID   isosorbide-hydrALAZINE  1 tablet Per Tube TID   mouth rinse  15 mL Mouth Rinse BID   polyvinyl alcohol  1 drop Both Eyes QID   QUEtiapine  25 mg Per Tube QHS   sodium chloride flush  3 mL Intravenous Q12H   ticagrelor  90 mg Per Tube BID   Continuous Infusions:  sodium chloride 250 mL (08/17/20 0812)   feeding supplement (JEVITY 1.5 CAL/FIBER) 65 mL/hr at 08/18/20 9244    Active Problems:   Acute ST elevation myocardial infarction (STEMI) [facility])   Cardiac arrest (HCC)   Elevated blood pressure reading with diagnosis of hypertension   Shock circulatory (HCC)   Encephalopathy   History of ETT   Cardiomyopathy, ischemic   Anoxic brain damage [facility])    Dysphagia   DNR (do not resuscitate) discussion   Consultants:  Cardiology  Interventional radiology  PCCM  Neurology  Procedures:  Cardiac catheterization 12/4  Continuous EEG 12/4 and overnight EEG with video 12/5  Echocardiogram 12/5  EEG 12/9  Percutaneous PEG tube placement 12/30  Antibiotics:     Time spent: 20 minutes    Erin Hearing ANP  [facility] 7 am - 330 pm/M-F 32 days

## 2020-08-24 LAB — GLUCOSE, CAPILLARY
Glucose-Capillary: 127 mg/dL — ABNORMAL HIGH (ref 70–99)
Glucose-Capillary: 139 mg/dL — ABNORMAL HIGH (ref 70–99)
Glucose-Capillary: 147 mg/dL — ABNORMAL HIGH (ref 70–99)
Glucose-Capillary: 157 mg/dL — ABNORMAL HIGH (ref 70–99)
Glucose-Capillary: 165 mg/dL — ABNORMAL HIGH (ref 70–99)
Glucose-Capillary: 169 mg/dL — ABNORMAL HIGH (ref 70–99)

## 2020-08-24 NOTE — TOC Progression Note (Signed)
Transition of Care [facility]) - Progression Note    Patient Details  Name: Antonio Navarro MRN: [id number] Date of Birth: 04/15/1961  Transition of Care [facility]) CM/SW Contact  Janae Bridgeman, RN Phone Number: 08/24/2020, 11:58 AM  Clinical Narrative:    Case management is continuing to follow the patient for discharge planning and hopeful that patient will qualify for CIR admission.   Expected Discharge Plan: Skilled Nursing Facility Barriers to Discharge: Continued Medical Work up,No SNF bed  Expected Discharge Plan and Services Expected Discharge Plan: Skilled Nursing Facility       Living arrangements for the past 2 months: Single Family Home                                       Social Determinants of Health (SDOH) Interventions    Readmission Risk Interventions No flowsheet data found.

## 2020-08-24 NOTE — Progress Notes (Signed)
  Speech Language Pathology Treatment: Dysphagia  Patient Details Name: Antonio Navarro MRN: [id number] DOB: Dec 28, 1960 Today's Date: 08/24/2020 Time: 4709-2957 SLP Time Calculation (min) (ACUTE ONLY): 10 min  Assessment / Plan / Recommendation Clinical Impression  Pt was seen for f/u after initiation of clear liquid diet on previous date. He consumed thin liquids via straw with improved sucking and initiation of intake. Still no overt s/s of aspiration. Today he even swallowed several bites of puree, with reduced bolus awareness that improved as trials progressed. Recommend advancing to full liquid diet to allow him some more pureed consistencies, such as ice cream and pudding, to work on progressing to more solid diet.    HPI HPI: Pt is a 60 yo male s/p arrest with downtime 9 minutes. Initial rhythm was V. fib, shocked and received 6 rounds of epi before ROSC achieved.  Total CPR time was 30 minutes. ETT 12/4-12/13. MRI 12/7 suggestive of a widespread anoxic injury. PMH includes kidney stones and HTN.      SLP Plan  Continue with current plan of care       Recommendations  Diet recommendations: Thin liquid (full liquid diet) Liquids provided via: Cup;Straw Medication Administration: Via alternative means Supervision: Staff to assist with self feeding;Full supervision/cueing for compensatory strategies Compensations: Minimize environmental distractions;Slow rate;Small sips/bites Postural Changes and/or Swallow Maneuvers: Seated upright 90 degrees                Oral Care Recommendations: Oral care QID Follow up Recommendations: Inpatient Rehab SLP Visit Diagnosis: Dysphagia, oral phase (R13.11) Plan: Continue with current plan of care       GO                Mahala Menghini., M.A. CCC-SLP Acute Rehabilitation Services Pager [phone] Office [phone]  08/24/2020, 11:50 AM

## 2020-08-24 NOTE — Plan of Care (Signed)
  Problem: Activity: Goal: Risk for activity intolerance will decrease Outcome: Progressing   Problem: Coping: Goal: Level of anxiety will decrease Outcome: Progressing   

## 2020-08-24 NOTE — H&P (Incomplete)
Physical Medicine and Rehabilitation Admission H&P     HPI: Antonio Navarro. Antonio Navarro is a 60 year old right-handed male with history of tobacco abuse, CKD stage III baseline creatinine 1.6, chronic back pain, obesity with BMI 26.70, hypertension.  Per chart review lives with fianc independent prior to admission working [employer].  Patient does have excellent support in the area.  Presented 07/22/2020 with vague chest discomfort in the midsternal area but no associated symptoms of shortness of breath and became unresponsive.  EMS was called because of his size CPR could not be performed in the bed.  He was found to be in V. fib received total of 6 rounds of epi and defib x6 with 1 round of amiodarone.  He required intubation.  Cardiac catheterization 95% mid circumflex stenosis treated with drug-eluting stent.  No other obstructive disease.  Echocardiogram with ejection fraction of 40 to 45% grade 1 diastolic dysfunction.  Currently maintained on aspirin as well as Brilinta.  Subcutaneous heparin added for DVT prophylaxis.  MRI of the brain completed for altered mental status showed fairly symmetric abnormal signal widespread in the hemispheric cortex and subcortical white matter with posterior circulation predominance favoring anoxic injury.  No associated hemorrhage or mass-effect.  EEG negative for seizure.  During patient's hospital course he did fail his initial swallowing evaluation underwent placement of percutaneous gastrostomy tube 08/17/2020 per interventional radiology Dr. Archer Asa.  He is currently maintained on a clear liquid diet thin liquids and tube feeds for nutritional support.  AKI/CKD stage III creatinine 2.00 has improved to 1.29 and monitored.  Palliative care has been consulted to establish goals of care.  He does have a skin tear to the right lateral back with foam dressing in place.  Due to patient's decreased functional mobility cognitive deficits he was admitted for a  comprehensive rehab program.  Review of Systems  Unable to perform ROS: Acuity of condition   Past Medical History:  Diagnosis Date  . Hypertension   . Kidney stones    Past Surgical History:  Procedure Laterality Date  . CORONARY/GRAFT ACUTE MI REVASCULARIZATION N/A 07/22/2020   Procedure: Coronary/Graft Acute MI Revascularization;  Surgeon: Runell Gess, MD;  Location: [facility] INVASIVE CV LAB;  Service: Cardiovascular;  Laterality: N/A;  . IR GASTROSTOMY TUBE MOD SED  08/17/2020  . LEFT HEART CATH AND CORONARY ANGIOGRAPHY N/A 07/22/2020   Procedure: LEFT HEART CATH AND CORONARY ANGIOGRAPHY;  Surgeon: Runell Gess, MD;  Location: [facility] INVASIVE CV LAB;  Service: Cardiovascular;  Laterality: N/A;   Family History  Problem Relation Age of Onset  . Cancer Mother   . Hypertension Father    Social History:  reports that he has been smoking cigarettes. He has been smoking about 1.00 pack per day. He has never used smokeless tobacco. He reports current alcohol use. He reports current drug use. Drug: Marijuana. Allergies: No Known Allergies Medications Prior to Admission  Medication Sig Dispense Refill  . amLODipine (NORVASC) 10 MG tablet Take 1 tablet (10 mg total) by mouth daily. 30 tablet 0  . cyclobenzaprine (FLEXERIL) 5 MG tablet Take 1-2 tablets (5-10 mg total) by mouth at bedtime. 15 tablet 0  . gabapentin (NEURONTIN) 300 MG capsule Take 1 capsule (300 mg total) by mouth 3 (three) times daily. 30 capsule 0  . tiZANidine (ZANAFLEX) 4 MG tablet Take 1 tablet (4 mg total) by mouth every 8 (eight) hours as needed. 30 tablet 0  . traMADol (ULTRAM) 50 MG tablet Take 1 tablet (  50 mg total) by mouth every 6 (six) hours as needed for severe pain. 20 tablet 0  . aspirin-acetaminophen-caffeine (EXCEDRIN MIGRAINE) [id number] MG per tablet Take 1 tablet by mouth every 6 (six) hours as needed (toothache). (Patient not taking: Reported on 07/22/2020)    . lidocaine (LIDODERM) 5 % Place 1 patch onto  the skin daily. Remove & Discard patch within 12 hours or as directed by MD (Patient not taking: Reported on 07/22/2020) 30 patch 0  . predniSONE (STERAPRED UNI-PAK 21 TAB) 10 MG (21) TBPK tablet Take by mouth daily. Per box instruction (Patient not taking: Reported on 07/22/2020) 21 tablet 0    Drug Regimen Review { DRUG REGIMEN [id number]  Home: Home Living Family/patient expects to be discharged to:: Private residence Living Arrangements: Spouse/significant other (fiance) Available Help at Discharge: Family Type of Home: House Home Access: Stairs to enter [employer] of Steps: 2 (back entrance; 4 at front entrance) Bathroom Shower/Tub: Chiropodist: Standard Additional Comments: Fiance at bedside reports pt was I with ADLs, and works in [employer].   Functional History: Prior Function Level of Independence: Independent Comments: Works in Tour [employer] Status:  Mobility: Bed Mobility Overal bed mobility: Needs Assistance Bed Mobility: Rolling,Sit to Sidelying Rolling: Max assist,+2 for physical assistance Sidelying to sit: Max assist,+2 for physical assistance Supine to sit: +2 for physical assistance,Mod assist Sit to supine: Total assist,+2 for physical assistance Sit to sidelying: Max assist,+2 for physical assistance General bed mobility comments: Assist for initiation and execution. max A +2 to assist back to supine, and rolling towards right for peri care Transfers Overall transfer level: Needs assistance Equipment used: 2 person hand held assist Transfers: Sit to/from [employer] Sit to Stand: Min assist,+2 physical assistance Stand pivot transfers: Mod assist,+2 physical assistance General transfer comment: MinA + 2 to power into standing from recliner, modA + 2 for pivot towards bed      ADL: ADL Overall ADL's : Needs assistance/impaired Eating/Feeding: NPO Eating/Feeding Details  (indicate cue type and reason): tube feeding Grooming: Wash/dry face,Wash/dry hands,Sitting,Cueing for safety,Cueing for sequencing,Total assistance Grooming Details (indicate cue type and reason): pt requried total hand over hand assist to complete task for thoroughness, pt initially initating task with tactile cues but then terminates task unexpectedly Upper Body Bathing: Maximal assistance Lower Body Bathing: Bed level,Total assistance Lower Body Bathing Details (indicate cue type and reason): d/t incontinent BM Upper Body Dressing : Maximal assistance,Sitting Upper Body Dressing Details (indicate cue type and reason): to don gown Lower Body Dressing: Total assistance Toilet Transfer: +2 for physical assistance,Moderate assistance Toilet Transfer Details (indicate cue type and reason): simulated via stand pivot transfer from EOB>recliner with MOD A+2 HHA Toileting- Clothing Manipulation and Hygiene: Total assistance,Bed level Toileting - Clothing Manipulation Details (indicate cue type and reason): posterior pericare from bed level Functional mobility during ADLs: Moderate assistance,+2 for physical assistance (stand pivot transfer) General ADL Comments: pt making good progress, following commands related to motor tasks nore vs. functional tasks related to ADLs.  Cognition: Cognition Overall Cognitive Status: Impaired/Different from baseline Arousal/Alertness: Awake/alert Orientation Level: Oriented to person,Disoriented to time,Disoriented to situation,Disoriented to place Attention: Sustained Sustained Attention: Impaired Sustained Attention Impairment: Verbal basic,Functional basic Problem Solving: Impaired Problem Solving Impairment: Verbal basic,Functional basic [employer] Scales of Cognitive Functioning: Confused/agitated Cognition Arousal/Alertness: Awake/alert Behavior During Therapy: WFL for tasks assessed/performed Overall Cognitive Status: Impaired/Different from  baseline Area of Impairment: Orientation,Attention,Following commands,Safety/judgement,Awareness,Problem solving Orientation Level: Disoriented to,Place,Situation,Time Current Attention  Level: Sustained Following Commands: Follows one step commands inconsistently Safety/Judgement: Decreased awareness of safety,Decreased awareness of deficits Awareness: Intellectual Problem Solving: Slow processing,Difficulty sequencing,Requires tactile cues,Requires verbal cues,Decreased initiation General Comments: pt seems to follow motor commands better than sequencing ADL tasks, pt nodding appropriately and does state "yes" and "no" appropriately. pt requires tactile cues at times to initiate tasks  Physical Exam: Blood pressure (!) 141/98, pulse 78, temperature 98.9 F (37.2 C), resp. rate 18, height 6' (1.829 m), weight 89.3 kg, SpO2 96 %. Physical Exam Abdominal:     Comments: PEG tube in place  Neurological:     Comments: Patient is alert.  Bilateral mittens in place.  Makes eye contact with examiner.  He does follow some one-step simple commands.  He would attempt to speak some simple words otherwise was nonverbal.     Results for orders placed or performed during the hospital encounter of 07/22/20 (from the past 48 hour(s))  Glucose, capillary     Status: Abnormal   Collection Time: 08/22/20  7:14 AM  Result Value Ref Range   Glucose-Capillary 148 (H) 70 - 99 mg/dL    Comment: Glucose reference range applies only to samples taken after fasting for at least 8 hours.  Glucose, capillary     Status: Abnormal   Collection Time: 08/22/20 11:04 AM  Result Value Ref Range   Glucose-Capillary 158 (H) 70 - 99 mg/dL    Comment: Glucose reference range applies only to samples taken after fasting for at least 8 hours.  Glucose, capillary     Status: Abnormal   Collection Time: 08/22/20  4:18 PM  Result Value Ref Range   Glucose-Capillary 108 (H) 70 - 99 mg/dL    Comment: Glucose reference range  applies only to samples taken after fasting for at least 8 hours.  Glucose, capillary     Status: Abnormal   Collection Time: 08/22/20  8:38 PM  Result Value Ref Range   Glucose-Capillary 128 (H) 70 - 99 mg/dL    Comment: Glucose reference range applies only to samples taken after fasting for at least 8 hours.  Glucose, capillary     Status: Abnormal   Collection Time: 08/23/20  1:12 AM  Result Value Ref Range   Glucose-Capillary 151 (H) 70 - 99 mg/dL    Comment: Glucose reference range applies only to samples taken after fasting for at least 8 hours.  Glucose, capillary     Status: Abnormal   Collection Time: 08/23/20  4:06 AM  Result Value Ref Range   Glucose-Capillary 117 (H) 70 - 99 mg/dL    Comment: Glucose reference range applies only to samples taken after fasting for at least 8 hours.  Glucose, capillary     Status: None   Collection Time: 08/23/20  7:45 AM  Result Value Ref Range   Glucose-Capillary 93 70 - 99 mg/dL    Comment: Glucose reference range applies only to samples taken after fasting for at least 8 hours.  Glucose, capillary     Status: Abnormal   Collection Time: 08/23/20 12:18 PM  Result Value Ref Range   Glucose-Capillary 138 (H) 70 - 99 mg/dL    Comment: Glucose reference range applies only to samples taken after fasting for at least 8 hours.  Glucose, capillary     Status: Abnormal   Collection Time: 08/23/20  4:50 PM  Result Value Ref Range   Glucose-Capillary 133 (H) 70 - 99 mg/dL    Comment: Glucose reference range applies only  to samples taken after fasting for at least 8 hours.  Glucose, capillary     Status: Abnormal   Collection Time: 08/23/20  9:44 PM  Result Value Ref Range   Glucose-Capillary 133 (H) 70 - 99 mg/dL    Comment: Glucose reference range applies only to samples taken after fasting for at least 8 hours.  Glucose, capillary     Status: Abnormal   Collection Time: 08/24/20 12:45 AM  Result Value Ref Range   Glucose-Capillary 169 (H)  70 - 99 mg/dL    Comment: Glucose reference range applies only to samples taken after fasting for at least 8 hours.  Glucose, capillary     Status: Abnormal   Collection Time: 08/24/20  5:12 AM  Result Value Ref Range   Glucose-Capillary 139 (H) 70 - 99 mg/dL    Comment: Glucose reference range applies only to samples taken after fasting for at least 8 hours.   No results found.     Medical Problem List and Plan: 1.  Significant anoxic brain injury secondary to out of hospital cardiac arrest secondary to STEMI status post cardiac catheterization with drug-eluting stent  -patient may *** shower  -ELOS/Goals: *** 2.  Antithrombotics: -DVT/anticoagulation: Subcutaneous heparin  -antiplatelet therapy: Aspirin 81 mg daily, Brilinta 90 mg twice daily 3. Pain Management: Tylenol as needed 4. Mood: Amantadine 100 mg daily  -antipsychotic agents: Seroquel 25 mg nightly 5. Neuropsych: This patient is not capable of making decisions on his own behalf. 6. Skin/Wound Care: Routine skin checks/skin tear right lateral back with foam dressing 7. Fluids/Electrolytes/Nutrition: Strict INO's with follow-up chemistries 8.  Ischemic cardiomyopathy.  Ejection fraction 40%.  No ACE inhibitor secondary to underlying kidney disease.  9.  Hypertension.Bidil 20-37.5 mg 3 times daily, Coreg 25 mg twice daily.  Monitor with increased mobility 10.  AKI on CKD stage III.  Follow-up chemistries.  Baseline creatinine 1.6 11.  Hyperlipidemia.  Lipitor 12.  Urinary retention.  Urecholine 10 mg 3 times daily.  Check PVR 13.  Hyperglycemia related to tube feeds.  Continue insulin therapy.  Hemoglobin A1c 4.7 14.  History of tobacco abuse.  Counseling 15.  Dysphagia.  Gastrostomy tube 08/17/2020 per interventional radiology.  Follow-up speech therapy ***  Cathlyn Parsons, PA-C 08/24/2020

## 2020-08-24 NOTE — Progress Notes (Addendum)
[facility] PROGRESS NOTE  Antonio Navarro ZOX:[id number] DOB: [birth year] DOA: 07/22/2020 PCP: Patient, No Pcp Per  Status: Remains inpatient appropriate because:Altered mental status and Unsafe d/c plan   Dispo: The patient is from: Home              Anticipated d/c is to: SNF              Anticipated d/c date is: > 3 days              Patient currently is medically stable to d/c.  Barriers to discharge: Medicaid and disability pending.  Initially declined by CIR subsequently has awakened and has been participating consistently with all therapies.  If family able to provide appropriate follow-up care after discharge CIR has agreed to take patient for admission.   Code Status: Full Family Communication: Attending physician spoke with son on 12/30; 1/06 spoke with patient's sister [name] who will contact patient's son and her other sister to discuss her support arrangements to ensure safe home environment after discharge from CIR DVT prophylaxis: sq heparin Vaccination status: Completed both doses of [place] Covid vaccination on 12/14/2019  Foley catheter: No  HPI: 60 year old male history of hypertension prescribed Norvasc prior to admission, chronic back pain, prior renal calculi who experienced an out of hospital cardiac arrest with downtime of 9 minutes.  Initial rhythm was ventricular fibrillation.  He was defibrillated deceived 6 doses of epi before ROSC.  Subsequent work-up revealed STEMI.  He underwent cardiac catheterization with a DES placed to the circumflex.  Cardiology following and has placed patient on Brilinta and aspirin and is also being treated with beta-blocker and statins.  From a neurological standpoint he clinically has anoxic brain injury.  During this hospitalization he experienced AKI and was treated with fluids and correction of hypoperfusion from cardiac arrest.  Echo performed this admission revealed ischemic cardiomyopathy with an EF of 40%  In addition to  above patient has been unable to pass swallowing evaluations and subsequently has undergone percutaneous PEG tube placement on 12/30.  Patient is awake and moving all extremities spontaneously but does not follow commands and does not verbalize palliative care has met with patient and family and has emphasized poor prognosis and will continue to meet with the family during the hospitalization.  Current plan is to transition to SNF bed once available.  Expect need for lifelong 24/7 care for all ADLs.  Subjective: Patient awake and lying in bed.  Eye contact made when spoken to.  Attempts to verbally communicate but speech is soft and at times inaudible, also at times speech is incomprehensible but he does attempt to answer orientation questions  Objective: Vitals:   08/24/20 0518 08/24/20 0927  BP: (!) 141/98 (!) 117/91  Pulse: 78 67  Resp: 18   Temp: 98.9 F (37.2 C) 98.3 F (36.8 C)  SpO2: 96% 100%    Intake/Output Summary (Last 24 hours) at 08/24/2020 1134 Last data filed at 08/24/2020 1030 Gross per 24 hour  Intake 1660 ml  Output 975 ml  Net 685 ml   Filed Weights   08/22/20 0542 08/23/20 0029 08/24/20 0051  Weight: 90.9 kg 87.4 kg 89.3 kg    Exam:  Constitutional: Alert, no acute distress Respiratory: Stable on room air, oxygen saturations 100%, bilateral lung sounds clear Cardiovascular: Regular pulse, normotensive, no peripheral edema, normal heart sounds S1-S2 Abdomen: Normoactive bowel sounds, soft and nontender.  PEG right unremarkable Neurologic: CN 2-12 grossly intact.  Moves all  extremities equally.  Strength 3+-4/5 upper and lower extremities.  Inconsistently following simple commands noting patient unable to follow commands today on 1/6-no spasticity Psychiatric: Awake but unable to accurately answer any orientation questions.  Also had difficulty repeating his name even when asked "is your name Derran"- flat affect.  Slow to respond to questions and  commands   Assessment/Plan: Acute problems: Out of hospital cardiac arrest secondary to STEMI -ROSC and subsequent catheterization revealed disease in circumflex requiring DES -Continue beta-blocker, statin; Brilinta which had been held briefly for PEG tube resumed on 12/30 by cardiology  Ischemic cardiomyopathy -Echo this admission EF 40% -Continue pharmacotherapy as above -No ACE inhibitor secondary to underlying kidney disease -Continue hydralazine and Isordil -Euvolemic -Likely will need follow-up echo in 3 months from initial echo  Significant anoxic brain injury secondary to cardiac arrest -Has awakened, is participating with PT and OT.  SLP has cleared for thin liquids.  Has been evaluated by CIR and if family able to provide follow-up care they will accept him for rehabilitative therapies likely this week -No hypertonicity or spasticity observed -Continue Seroquel 25 mg at HS for insomnia and brain injury sequela -Continue daytime Symmetrel to help contain daytime alertness in context of brain injury  Hypertension -Was on Norvasc prior to admission -Continue cardiovascular meds as listed above  Nonsustained VT -Continue amiodarone (plan is to check LFTs in a.m. on 1/7) and beta-blocker -Cardiology managing  Acute on chronic kidney disease stage IIIb -Suspect prerenal etiology in the setting of hypoperfusion from ventricular fibrillation arrest -Baseline creatinine between 1.6 and 2; creatinine on 12/19 was 1.67-will repeat in a.m. on 08/19/2020 -Continue free water per tube and intermittently follow labs  Dysphagia, moderate protein calorie malnutrition Nutrition Problem: Inadequate oral intake Etiology: inability to eat Signs/Symptoms: NPO status-as of 1/5 SLP has cleared for thin liquids-given difficulty with purposeful movement especially fine motor movement patient will likely need to be assisted with diet therefore order placed Interventions: Tube  feeding,Prostat Estimated body mass index is 26.7 kg/m as calculated from the following:   Height as of this encounter: 6' (1.829 m).   Weight as of this encounter: 89.3 kg.  Skin tear right lateral back Wound / Incision (Open or Dehisced) 07/28/20 Skin tear Back Lateral;Right;Upper (Active)  Date First Assessed/Time First Assessed: 07/28/20 0300   Wound Type: Skin tear  Location: Back  Location Orientation: Lateral;Right;Upper  Present on Admission: No    Assessments 07/28/2020  7:49 PM 08/23/2020  8:00 AM  Dressing Type None Foam - Lift dressing to assess site every shift  Dressing Status -- Clean;Dry;Intact  Dressing Change Frequency -- PRN  Site / Wound Assessment -- Clean;Dry  Drainage Amount None --  Treatment Cleansed Other (Comment)     No Linked orders to display     Wound / Incision (Open or Dehisced) 08/04/20 (MASD) Moisture Associated Skin Damage Buttocks Right;Left (Active)  Date First Assessed/Time First Assessed: 08/04/20 0800   Wound Type: (MASD) Moisture Associated Skin Damage  Location: Buttocks  Location Orientation: Right;Left    Assessments 08/04/2020  8:16 AM 08/19/2020  8:00 AM  Dressing Type Foam - Lift dressing to assess site every shift Foam - Lift dressing to assess site every shift  Dressing Changed New --  Dressing Status Clean;Dry;Intact --  Dressing Change Frequency PRN --  Treatment Cleansed --     No Linked orders to display    Other problems: Acute hypernatremia -Continue free water per tube  Hyperglycemia without an underlying diagnosis  of diabetes mellitus -Likely related to acute stress -CBGs while on tube feedings controlled with sliding scale insulin and high-dose Levemir insulin BID CBGs well-controlled at this juncture -Consider change to Glucerna tube feeding Levemir dosage can be decreased to minimize risk for unintended hypoglycemia -Hemoglobin A1c 4.7   Acute hypoxemic respiratory failure -Resolved -Stable on room air with O2 sats  between 97 and 99%   Data Reviewed: Basic Metabolic Panel: Recent Labs  Lab 08/19/20 0157  NA 134*  K 4.0  CL 99  CO2 27  GLUCOSE 203*  BUN 23*  CREATININE 1.29*  CALCIUM 9.0   Liver Function Tests: No results for input(s): AST, ALT, ALKPHOS, BILITOT, PROT, ALBUMIN in the last 168 hours. No results for input(s): LIPASE, AMYLASE in the last 168 hours. No results for input(s): AMMONIA in the last 168 hours. CBC: Recent Labs  Lab 08/19/20 0157  WBC 6.9  HGB 9.6*  HCT 29.5*  MCV 104.6*  PLT 200   Cardiac Enzymes: No results for input(s): CKTOTAL, CKMB, CKMBINDEX, TROPONINI in the last 168 hours. BNP (last 3 results) No results for input(s): BNP in the last 8760 hours.  ProBNP (last 3 results) No results for input(s): PROBNP in the last 8760 hours.  CBG: Recent Labs  Lab 08/23/20 1650 08/23/20 2144 08/24/20 0045 08/24/20 0512 08/24/20 0853  GLUCAP 133* 133* 169* 139* 127*    No results found for this or any previous visit (from the past 240 hour(s)).   Studies: No results found.  Scheduled Meds: . amantadine  100 mg Per Tube Daily  . aspirin  81 mg Per Tube Daily  . atorvastatin  80 mg Per Tube Daily  . bethanechol  10 mg Per Tube TID  . carvedilol  25 mg Per Tube BID WC  . chlorhexidine gluconate (MEDLINE KIT)  15 mL Mouth Rinse BID  . feeding supplement (PROSource TF)  45 mL Per Tube BID  . free water  200 mL Per Tube Q3H  . heparin  5,000 Units Subcutaneous Q8H  . insulin aspart  0-9 Units Subcutaneous Q4H  . insulin detemir  30 Units Subcutaneous BID  . isosorbide-hydrALAZINE  1 tablet Per Tube TID  . mouth rinse  15 mL Mouth Rinse BID  . polyvinyl alcohol  1 drop Both Eyes QID  . QUEtiapine  25 mg Per Tube QHS  . sodium chloride flush  3 mL Intravenous Q12H  . ticagrelor  90 mg Per Tube BID   Continuous Infusions: . sodium chloride 250 mL (08/17/20 0812)  . feeding supplement (JEVITY 1.5 CAL/FIBER) 1,000 mL (08/23/20 1235)    Active  Problems:   Acute ST elevation myocardial infarction (STEMI) ([place])   Cardiac arrest ([facility])   Elevated blood pressure reading with diagnosis of hypertension   Shock circulatory (HCC)   Encephalopathy   History of ETT   Cardiomyopathy, ischemic   Anoxic brain damage [facility])   Dysphagia   DNR (do not resuscitate) discussion   Consultants:  Cardiology  Interventional radiology  PCCM  Neurology  Procedures:  Cardiac catheterization 12/4  Continuous EEG 12/4 and overnight EEG with video 12/5  Echocardiogram 12/5  EEG 12/9  Percutaneous PEG tube placement 12/30  Antibiotics:     Time spent: 20 minutes    Erin Hearing ANP  [facility] 7 am - 330 pm/M-F 33 days

## 2020-08-24 NOTE — Progress Notes (Addendum)
Inpatient Rehabilitation Admissions Coordinator  I spoke with both sister by conference call to further clarify caregivers support needed long term. They will continue to discuss with family and fiance and will follow up with me on Monday. I spoke with son, Gregary Signs yesterday and await caregiver support arrangements before I can pursue possible  Cir admit. Family aware dispo will be either CIR then home or SNF.  Ottie Glazier, RN, MSN Rehab Admissions Coordinator [phone] 08/24/2020 7:39 PM

## 2020-08-25 LAB — CBC
HCT: 34.6 % — ABNORMAL LOW (ref 39.0–52.0)
Hemoglobin: 10.9 g/dL — ABNORMAL LOW (ref 13.0–17.0)
MCH: 33.4 pg (ref 26.0–34.0)
MCHC: 31.5 g/dL (ref 30.0–36.0)
MCV: 106.1 fL — ABNORMAL HIGH (ref 80.0–100.0)
Platelets: 162 10*3/uL (ref 150–400)
RBC: 3.26 MIL/uL — ABNORMAL LOW (ref 4.22–5.81)
RDW: 14 % (ref 11.5–15.5)
WBC: 7.3 10*3/uL (ref 4.0–10.5)
nRBC: 0 % (ref 0.0–0.2)

## 2020-08-25 LAB — COMPREHENSIVE METABOLIC PANEL
ALT: 133 U/L — ABNORMAL HIGH (ref 0–44)
AST: 58 U/L — ABNORMAL HIGH (ref 15–41)
Albumin: 3.2 g/dL — ABNORMAL LOW (ref 3.5–5.0)
Alkaline Phosphatase: 85 U/L (ref 38–126)
Anion gap: 10 (ref 5–15)
BUN: 20 mg/dL (ref 6–20)
CO2: 28 mmol/L (ref 22–32)
Calcium: 9.5 mg/dL (ref 8.9–10.3)
Chloride: 99 mmol/L (ref 98–111)
Creatinine, Ser: 1.34 mg/dL — ABNORMAL HIGH (ref 0.61–1.24)
GFR, Estimated: >60 mL/min (ref >60)
Glucose, Bld: 146 mg/dL — ABNORMAL HIGH (ref 70–99)
Potassium: 4.1 mmol/L (ref 3.5–5.1)
Sodium: 137 mmol/L (ref 135–145)
Total Bilirubin: 0.7 mg/dL (ref 0.3–1.2)
Total Protein: 7 g/dL (ref 6.5–8.1)

## 2020-08-25 LAB — GLUCOSE, CAPILLARY
Glucose-Capillary: 110 mg/dL — ABNORMAL HIGH (ref 70–99)
Glucose-Capillary: 119 mg/dL — ABNORMAL HIGH (ref 70–99)
Glucose-Capillary: 139 mg/dL — ABNORMAL HIGH (ref 70–99)
Glucose-Capillary: 153 mg/dL — ABNORMAL HIGH (ref 70–99)
Glucose-Capillary: 154 mg/dL — ABNORMAL HIGH (ref 70–99)
Glucose-Capillary: 162 mg/dL — ABNORMAL HIGH (ref 70–99)

## 2020-08-25 NOTE — Progress Notes (Signed)
Occupational Therapy Treatment Patient Details Name: Antonio Navarro MRN: [id number] DOB: [birth year] Today's Date: 08/25/2020    History of present illness 60 y.o. male with history of tobacco abuse, obesity, HTN who was admitted 07/22/20 post cardiac arrest with inferolateral STEMI. He was resuscitated in the field by EMS after being found to be in V fib. Approximately 30 minutes of CPR. He was intubated on arrival.   OT comments  Patient is progressing toward OT stated goals.  He appears more alert, and is following basic commands inconsistently, but following is improved.  He was able to sit edge of bed, and walk in the room this date with HHA of two and a gait belt.  Deficits below are impacting independence significantly.  CIR has been recommended, hopefully as mobility improves, his ADL status will advance.   Acute OT will continue to follow in hopes of increasing functional independence.    Follow Up Recommendations  CIR    Equipment Recommendations  Wheelchair (measurements OT);Wheelchair cushion (measurements OT);Hospital bed;3 in 1 bedside commode    Recommendations for Other Services      Precautions / Restrictions Precautions Precautions: Fall Precaution Comments: mitts, gtube       Mobility Bed Mobility Overal bed mobility: Needs Assistance Bed Mobility: Supine to Sit     Supine to sit: Min assist;+2 for physical assistance        Transfers Overall transfer level: Needs assistance Equipment used: 2 person hand held assist Transfers: Sit to/from [facility] Sit to Stand: Min assist Stand pivot transfers: Min assist       General transfer comment: walked to the door, then back around foot of bed to recliner Min A times 2    Balance Overall balance assessment: Needs assistance Sitting-balance support: Bilateral upper extremity supported;Feet supported Sitting balance-Leahy Scale: Poor   Postural control: Posterior lean;Right lateral lean;Left  lateral lean Standing balance support: Bilateral upper extremity supported;During functional activity Standing balance-Leahy Scale: Poor Standing balance comment: reliant on external support                           ADL either performed or assessed with clinical judgement   ADL       Grooming: Wash/dry hands;Wash/dry face;Maximal assistance;Sitting Grooming Details (indicate cue type and reason): pt requried total hand over hand assist to complete task for thoroughness, pt initially initating task with tactile cues but then terminates task unexpectedly                     Toileting- Clothing Manipulation and Hygiene: Total assistance;Sit to/from stand               Vision       Perception     Praxis      Cognition     Overall Cognitive Status: Impaired/Different from baseline                                                            Pertinent Vitals/ Pain       Pain Assessment: Faces Faces Pain Scale: Hurts a little bit Pain Location: intermittent grimacing, does not localize Pain Descriptors / Indicators: Grimacing Pain Intervention(s): Monitored during session  Frequency  Min 2X/week        Progress Toward Goals  OT Goals(current goals can now be found in the care plan section)  Progress towards OT goals: Progressing toward goals  Acute Rehab OT Goals Patient Stated Goal: pt fiance would like him to get stronger OT Goal Formulation: With family Time For Goal Achievement: 08/30/20 Potential to Achieve Goals: [place] Discharge plan remains appropriate;Frequency remains appropriate    Co-evaluation    PT/OT/SLP Co-Evaluation/Treatment: Yes Reason for Co-Treatment: Complexity of the patient's impairments (multi-system involvement);Necessary to address cognition/behavior during functional activity;For patient/therapist  safety   OT goals addressed during session: ADL's and self-care      AM-PAC OT "6 Clicks" Daily Activity     Outcome Measure   Help from another person eating meals?: A Lot Help from another person taking care of personal grooming?: A Lot Help from another person toileting, which includes using toliet, bedpan, or urinal?: Total Help from another person bathing (including washing, rinsing, drying)?: A Lot Help from another person to put on and taking off regular upper body clothing?: A Lot Help from another person to put on and taking off regular lower body clothing?: Total 6 Click Score: 10    End of Session Equipment Utilized During Treatment: Gait belt  OT Visit Diagnosis: Muscle weakness (generalized) (M62.81);Pain;Other abnormalities of gait and mobility (R26.89);Cognitive communication deficit (R41.841);Low vision, both eyes (H54.2)   Activity Tolerance Patient tolerated treatment well   Patient Left in chair;with call bell/phone within reach;with chair alarm set;Other (comment)   Nurse Communication Mobility status        Time: 1430-1500 OT Time Calculation (min): 30 min  Charges: OT General Charges $OT Visit: 1 Visit OT Treatments $Therapeutic Activity: 8-22 mins  08/25/2020  Rich, OTR/L  Acute Rehabilitation Services  Office:  [phone]    Metta Clines 08/25/2020, 3:23 PM

## 2020-08-25 NOTE — Progress Notes (Signed)
Physical Therapy Treatment Patient Details Name: Antonio Navarro MRN: [id number] DOB: 02-03-1961 Today's Date: 08/25/2020    History of Present Illness [name] with history of tobacco abuse, obesity, HTN who was admitted 07/22/20 post cardiac arrest with inferolateral STEMI. He was resuscitated in the field by EMS after being found to be in V fib. Approximately 30 minutes of CPR. He was intubated on arrival.  Pt with anoxic brain injury. PEG placed on 08/17/20    PT Comments    Pt making progress and had met or nearly met goals.  Goals were updated.  He was able to ambulate in room with mod A of 2 for safety.  Pt following simple motor commands with assist to initiate.  Recommend progressing to RW or EVA walker next visit.  Continue to recommend CIR.  (Note pt was cotx PT/OT as requiring assist of 2 for safety and did not have tech to assist today.)    Follow Up Recommendations  CIR     Equipment Recommendations  Other (comment) (TBA next venue)    Recommendations for Other Services       Precautions / Restrictions Precautions Precautions: Fall Precaution Comments: mitts, gtube    Mobility  Bed Mobility Overal bed mobility: Needs Assistance Bed Mobility: Supine to Sit     Supine to sit: Min assist;+2 for physical assistance     General bed mobility comments: Assist for initiation and min A to lift trunk and steady  Transfers Overall transfer level: Needs assistance Equipment used: 2 person hand held assist Transfers: Sit to/from [facility] Sit to Stand: Min assist;+2 safety/equipment Stand pivot transfers: Min assist;+2 safety/equipment       General transfer comment: Assist of 2 for safety; tacitle and visual cues to initiate; performed sit to stand x 6 throughout session  Ambulation/Gait Ambulation/Gait assistance: Mod assist;+2 safety/equipment Gait Distance (Feet): 10 Feet (3', 12', then 8') Assistive device: 2 person hand held  assist Gait Pattern/deviations: Step-through pattern;Decreased stride length;Ataxic Gait velocity: decreased   General Gait Details: Poor balance and control; light mod A of 2 for balance and cueing for direction/initiation; performed HHA on 1 side and either HHA or bed rail on opposite side   Stairs             Wheelchair Mobility    Modified Rankin (Stroke Patients Only)       Balance Overall balance assessment: Needs assistance Sitting-balance support: Bilateral upper extremity supported;Feet supported Sitting balance-Leahy Scale: Poor Sitting balance - Comments: Requiring UE support and close guarding for safety, but sat EOB for 5-8 mins Postural control: Posterior lean;Right lateral lean;Left lateral lean Standing balance support: Bilateral upper extremity supported;During functional activity Standing balance-Leahy Scale: Poor Standing balance comment: requiring min-mod A                            Cognition Arousal/Alertness: Awake/alert Behavior During Therapy: WFL for tasks assessed/performed Overall Cognitive Status: Impaired/Different from baseline Area of Impairment: Orientation;Attention;Following commands;Safety/judgement;Awareness;Problem solving               Rancho Levels of Cognitive Functioning Rancho Los Amigos Scales of Cognitive Functioning: Confused/inappropriate/non-agitated Orientation Level: Disoriented to;Place;Situation;Time Current Attention Level: Sustained   Following Commands: Follows one step commands inconsistently Safety/Judgement: Decreased awareness of safety;Decreased awareness of deficits Awareness: Intellectual Problem Solving: Slow processing;Difficulty sequencing;Requires tactile cues;Requires verbal cues;Decreased initiation General Comments: Pt following simple motor commands; does occasionally have verbal response and  at one point stated "It doesn't matter.";  Required tactile cues to initiate       Exercises      General Comments        Pertinent Vitals/Pain Pain Assessment: Faces Faces Pain Scale: Hurts a little bit Pain Location: intermittent grimacing, does not localize Pain Descriptors / Indicators: Grimacing Pain Intervention(s): Monitored during session    Home Living                      Prior Function            PT Goals (current goals can now be found in the care plan section) Acute Rehab PT Goals Patient Stated Goal: pt fiance would like him to get stronger PT Goal Formulation: With patient Time For Goal Achievement: 09/08/20 Potential to Achieve Goals: Good Progress towards PT goals: Goals met and updated - see care plan    Frequency    Min 3X/week      PT Plan Current plan remains appropriate    Co-evaluation PT/OT/SLP Co-Evaluation/Treatment: Yes Reason for Co-Treatment: For patient/therapist safety PT goals addressed during session: Mobility/safety with mobility;Balance OT goals addressed during session: ADL's and self-care      AM-PAC PT "6 Clicks" Mobility   Outcome Measure  Help needed turning from your back to your side while in a flat bed without using bedrails?: A Little Help needed moving from lying on your back to sitting on the side of a flat bed without using bedrails?: A Little Help needed moving to and from a bed to a chair (including a wheelchair)?: A Little Help needed standing up from a chair using your arms (e.g., wheelchair or bedside chair)?: A Little Help needed to walk in hospital room?: A Lot Help needed climbing 3-5 steps with a railing? : A Lot 6 Click Score: 16    End of Session Equipment Utilized During Treatment: Gait belt Activity Tolerance: Patient tolerated treatment well Patient left: with call bell/phone within reach;in bed;with bed alarm set Nurse Communication: Mobility status PT Visit Diagnosis: Other abnormalities of gait and mobility (R26.89)     Time: 9678-9381 PT Time Calculation  (min) (ACUTE ONLY): 28 min  Charges:  $Gait Training: 8-22 mins                     Abran Richard, PT Acute Rehab Services Pager [phone] [facility] [place] 08/25/2020, 3:39 PM

## 2020-08-25 NOTE — Progress Notes (Signed)
[facility] PROGRESS NOTE  Antonio Navarro:[id number] DOB: 05-31-1961 DOA: 07/22/2020 PCP: Patient, No Pcp Per  Status: Remains inpatient appropriate because:Altered mental status and Unsafe d/c plan   Dispo: The patient is from: Home              Anticipated d/c is to: SNF              Anticipated d/c date is: > 3 days              Patient currently is medically stable to d/c.  Barriers to discharge: Medicaid and disability pending.  Initially declined by CIR subsequently has awakened and has been participating consistently with all therapies.  If family able to provide appropriate follow-up care after discharge CIR has agreed to take patient for admission.   Code Status: Full Family Communication: Attending physician spoke with son on 12/30; 1/06 spoke with patient's sister [name] who will contact patient's son and her other sister to discuss her support arrangements to ensure safe home environment after discharge from CIR DVT prophylaxis: sq heparin Vaccination status: Completed both doses of [place] Covid vaccination on 12/14/2019  Foley catheter: No  HPI: 60 year old male history of hypertension prescribed Norvasc prior to admission, chronic back pain, prior renal calculi who experienced an out of hospital cardiac arrest with downtime of 9 minutes.  Initial rhythm was ventricular fibrillation.  He was defibrillated deceived 6 doses of epi before ROSC.  Subsequent work-up revealed STEMI.  He underwent cardiac catheterization with a DES placed to the circumflex.  Cardiology following and has placed patient on Brilinta and aspirin and is also being treated with beta-blocker and statins.  From a neurological standpoint he clinically has anoxic brain injury.  During this hospitalization he experienced AKI and was treated with fluids and correction of hypoperfusion from cardiac arrest.  Echo performed this admission revealed ischemic cardiomyopathy with an EF of 40%  In addition to  above patient has been unable to pass swallowing evaluations and subsequently has undergone percutaneous PEG tube placement on 12/30.  Patient is awake and moving all extremities spontaneously but does not follow commands and does not verbalize palliative care has met with patient and family and has emphasized poor prognosis and will continue to meet with the family during the hospitalization.  Current plan is to transition to SNF bed once available.  Expect need for lifelong 24/7 care for all ADLs.  Subjective: Patient much more awake and alert today.  Completely confused but answering questions and attempting to follow some simple commands.  Objective: Vitals:   08/25/20 0900 08/25/20 1204  BP: 115/72 108/74  Pulse:  80  Resp:  19  Temp:  97.8 F (36.6 C)  SpO2:  100%    Intake/Output Summary (Last 24 hours) at 08/25/2020 1305 Last data filed at 08/25/2020 6294 Gross per 24 hour  Intake 3470 ml  Output 1150 ml  Net 2320 ml   Filed Weights   08/23/20 0029 08/24/20 0051 08/25/20 0024  Weight: 87.4 kg 89.3 kg 89 kg    Exam:  Constitutional: Alert, no acute distress Respiratory: Stable on room air, oxygen saturations 100%, bilateral lung sounds clear Cardiovascular: Regular pulse, normotensive, no peripheral edema, normal heart sounds S1-S2 Abdomen: Normoactive bowel sounds, soft and nontender.  PEG unremarkable Neurologic: CN 2-12 grossly intact.  Moves all extremities equally.  Strength 3+-4/5 upper and lower extremities.  Inconsistently following simple commands noting patient unable to follow commands today on 1/6-no spasticity Psychiatric: Awake  but unable to accurately answer any orientation questions.  Also had difficulty repeating his name even when asked "is your name Manson"- flat affect.  Slow to respond to questions and commands   Assessment/Plan: Acute problems: Out of hospital cardiac arrest secondary to STEMI -ROSC and subsequent catheterization revealed disease in  circumflex requiring DES -Continue beta-blocker, statin; Brilinta which had been held briefly for PEG tube resumed on 12/30 by cardiology  Ischemic cardiomyopathy -Echo this admission EF 40% -Continue pharmacotherapy as above -No ACE inhibitor secondary to underlying kidney disease -Continue hydralazine and Isordil -Euvolemic -Likely will need follow-up echo in 3 months from initial echo  Significant anoxic brain injury secondary to cardiac arrest -Has awakened, is participating with PT and OT.  SLP has cleared for thin liquids.  Has been evaluated by CIR and if family able to provide follow-up care they will accept him for rehabilitative therapies likely this week -No hypertonicity or spasticity observed -Continue Seroquel 25 mg at HS for insomnia and brain injury sequela -Continue daytime Symmetrel to help contain daytime alertness in context of brain injury  Hypertension -Was on Norvasc prior to admission -Continue cardiovascular meds as listed above  Nonsustained VT -Continue amiodarone (plan is to check LFTs in a.m. on 1/7) and beta-blocker -Cardiology managing  Acute on chronic kidney disease stage IIIb -Suspect prerenal etiology in the setting of hypoperfusion from ventricular fibrillation arrest -Baseline creatinine between 1.6 and 2; creatinine on 12/19 was 1.67-will repeat in a.m. on 08/19/2020 -Continue free water per tube and intermittently follow labs  Dysphagia, moderate protein calorie malnutrition Nutrition Problem: Inadequate oral intake Etiology: inability to eat Signs/Symptoms: NPO status-as of 1/5 SLP has cleared for thin liquids-given difficulty with purposeful movement especially fine motor movement patient will likely need to be assisted with diet therefore order placed Interventions: Tube feeding,Prostat Estimated body mass index is 26.61 kg/m as calculated from the following:   Height as of this encounter: 6' (1.829 m).   Weight as of this encounter: 89  kg.  Skin tear right lateral back Wound / Incision (Open or Dehisced) 07/28/20 Skin tear Back Lateral;Right;Upper (Active)  Date First Assessed/Time First Assessed: 07/28/20 0300   Wound Type: Skin tear  Location: Back  Location Orientation: Lateral;Right;Upper  Present on Admission: No    Assessments 07/28/2020  7:49 PM 08/23/2020  8:00 AM  Dressing Type None Foam - Lift dressing to assess site every shift  Dressing Status - Clean;Dry;Intact  Dressing Change Frequency - PRN  Site / Wound Assessment - Clean;Dry  Drainage Amount None -  Treatment Cleansed Other (Comment)     No Linked orders to display     Wound / Incision (Open or Dehisced) 08/04/20 (MASD) Moisture Associated Skin Damage Buttocks Right;Left (Active)  Date First Assessed/Time First Assessed: 08/04/20 0800   Wound Type: (MASD) Moisture Associated Skin Damage  Location: Buttocks  Location Orientation: Right;Left    Assessments 08/04/2020  8:16 AM 08/19/2020  8:00 AM  Dressing Type Foam - Lift dressing to assess site every shift Foam - Lift dressing to assess site every shift  Dressing Changed New -  Dressing Status Clean;Dry;Intact -  Dressing Change Frequency PRN -  Treatment Cleansed -     No Linked orders to display    Other problems: Acute hypernatremia -Continue free water per tube  Hyperglycemia without an underlying diagnosis of diabetes mellitus -Likely related to acute stress -CBGs while on tube feedings controlled with sliding scale insulin and high-dose Levemir insulin BID CBGs well-controlled at  this juncture -Consider change to Glucerna tube feeding Levemir dosage can be decreased to minimize risk for unintended hypoglycemia -Hemoglobin A1c 4.7   Acute hypoxemic respiratory failure -Resolved -Stable on room air with O2 sats between 97 and 99%   Data Reviewed: Basic Metabolic Panel: Recent Labs  Lab 08/19/20 0157 08/25/20 0303  NA 134* 137  K 4.0 4.1  CL 99 99  CO2 27 28  GLUCOSE 203* 146*   BUN 23* 20  CREATININE 1.29* 1.34*  CALCIUM 9.0 9.5   Liver Function Tests: Recent Labs  Lab 08/25/20 0303  AST 58*  ALT 133*  ALKPHOS 85  BILITOT 0.7  PROT 7.0  ALBUMIN 3.2*   No results for input(s): LIPASE, AMYLASE in the last 168 hours. No results for input(s): AMMONIA in the last 168 hours. CBC: Recent Labs  Lab 08/19/20 0157 08/25/20 0303  WBC 6.9 7.3  HGB 9.6* 10.9*  HCT 29.5* 34.6*  MCV 104.6* 106.1*  PLT 200 162   Cardiac Enzymes: No results for input(s): CKTOTAL, CKMB, CKMBINDEX, TROPONINI in the last 168 hours. BNP (last 3 results) No results for input(s): BNP in the last 8760 hours.  ProBNP (last 3 results) No results for input(s): PROBNP in the last 8760 hours.  CBG: Recent Labs  Lab 08/24/20 1641 08/24/20 2002 08/25/20 0019 08/25/20 0503 08/25/20 0721  GLUCAP 147* 165* 153* 139* 154*    No results found for this or any previous visit (from the past 240 hour(s)).   Studies: No results found.  Scheduled Meds: . amantadine  100 mg Per Tube Daily  . aspirin  81 mg Per Tube Daily  . atorvastatin  80 mg Per Tube Daily  . bethanechol  10 mg Per Tube TID  . carvedilol  25 mg Per Tube BID WC  . chlorhexidine gluconate (MEDLINE KIT)  15 mL Mouth Rinse BID  . feeding supplement (PROSource TF)  45 mL Per Tube BID  . free water  200 mL Per Tube Q3H  . heparin  5,000 Units Subcutaneous Q8H  . insulin aspart  0-9 Units Subcutaneous Q4H  . insulin detemir  30 Units Subcutaneous BID  . isosorbide-hydrALAZINE  1 tablet Per Tube TID  . mouth rinse  15 mL Mouth Rinse BID  . polyvinyl alcohol  1 drop Both Eyes QID  . QUEtiapine  25 mg Per Tube QHS  . sodium chloride flush  3 mL Intravenous Q12H  . ticagrelor  90 mg Per Tube BID   Continuous Infusions: . sodium chloride 250 mL (08/17/20 0812)  . feeding supplement (JEVITY 1.5 CAL/FIBER) 1,000 mL (08/23/20 1235)    Active Problems:   Acute ST elevation myocardial infarction (STEMI) ([place])    Cardiac arrest ([facility])   Elevated blood pressure reading with diagnosis of hypertension   Shock circulatory (HCC)   Encephalopathy   History of ETT   Cardiomyopathy, ischemic   Anoxic brain damage [facility])   Dysphagia   DNR (do not resuscitate) discussion   Consultants:  Cardiology  Interventional radiology  PCCM  Neurology  Procedures:  Cardiac catheterization 12/4  Continuous EEG 12/4 and overnight EEG with video 12/5  Echocardiogram 12/5  EEG 12/9  Percutaneous PEG tube placement 12/30  Antibiotics: Anti-infectives (From admission, onward)   Start     Dose/Rate Route Frequency Ordered Stop   08/17/20 0000  ceFAZolin (ANCEF) IVPB 2g/100 mL premix        2 g 200 mL/hr over 30 Minutes Intravenous To Radiology 08/15/20 1346 08/17/20  1111   07/22/20 0830  cefTRIAXone (ROCEPHIN) 2 g in sodium chloride 0.9 % 100 mL IVPB        2 g 200 mL/hr over 30 Minutes Intravenous Every 24 hours 07/22/20 0720 07/26/20 0858        Time spent: 20 minutes    Erin Hearing ANP  [facility] 7 am - 330 pm/M-F 34 days

## 2020-08-26 DIAGNOSIS — R1310 Dysphagia, unspecified: Secondary | ICD-10-CM

## 2020-08-26 LAB — GLUCOSE, CAPILLARY
Glucose-Capillary: 137 mg/dL — ABNORMAL HIGH (ref 70–99)
Glucose-Capillary: 119 mg/dL — ABNORMAL HIGH (ref 70–99)
Glucose-Capillary: 174 mg/dL — ABNORMAL HIGH (ref 70–99)
Glucose-Capillary: 119 mg/dL — ABNORMAL HIGH (ref 70–99)
Glucose-Capillary: 157 mg/dL — ABNORMAL HIGH (ref 70–99)

## 2020-08-26 LAB — BASIC METABOLIC PANEL
Anion gap: 9 (ref 5–15)
BUN: 20 mg/dL (ref 6–20)
CO2: 30 mmol/L (ref 22–32)
Calcium: 9.8 mg/dL (ref 8.9–10.3)
Chloride: 101 mmol/L (ref 98–111)
Creatinine, Ser: 1.3 mg/dL — ABNORMAL HIGH (ref 0.61–1.24)
GFR, Estimated: >60 mL/min (ref >60)
Glucose, Bld: 146 mg/dL — ABNORMAL HIGH (ref 70–99)
Potassium: 4.2 mmol/L (ref 3.5–5.1)
Sodium: 140 mmol/L (ref 135–145)

## 2020-08-26 LAB — CBC
HCT: 37.3 % — ABNORMAL LOW (ref 39.0–52.0)
Hemoglobin: 11.7 g/dL — ABNORMAL LOW (ref 13.0–17.0)
MCH: 33.1 pg (ref 26.0–34.0)
MCHC: 31.4 g/dL (ref 30.0–36.0)
MCV: 105.7 fL — ABNORMAL HIGH (ref 80.0–100.0)
Platelets: 160 10*3/uL (ref 150–400)
RBC: 3.53 MIL/uL — ABNORMAL LOW (ref 4.22–5.81)
RDW: 14 % (ref 11.5–15.5)
WBC: 7.2 10*3/uL (ref 4.0–10.5)
nRBC: 0 % (ref 0.0–0.2)

## 2020-08-26 NOTE — Plan of Care (Signed)
  Problem: Activity: Goal: Risk for activity intolerance will decrease Outcome: Progressing   Problem: Nutrition: Goal: Adequate nutrition will be maintained Outcome: Progressing   Problem: Coping: Goal: Level of anxiety will decrease Outcome: Progressing   Problem: Safety: Goal: Ability to remain free from injury will improve Outcome: Progressing   

## 2020-08-26 NOTE — Progress Notes (Signed)
PROGRESS NOTE    Antonio Navarro  PTW:[id number] DOB: 10/11/1960 DOA: 07/22/2020 PCP: Patient, No Pcp Per   Brief Narrative:  60 year old male history of hypertension prescribed Norvasc prior to admission, chronic back pain, prior renal calculi who experienced an out of hospital cardiac arrest with downtime of 9 minutes.  Initial rhythm was ventricular fibrillation.  He was defibrillated deceived 6 doses of epi before ROSC.  Subsequent work-up revealed STEMI.  He underwent cardiac catheterization with a DES placed to the circumflex.  Cardiology following and has placed patient on Brilinta and aspirin and is also being treated with beta-blocker and statins.  From a neurological standpoint he clinically has anoxic brain injury.  During this hospitalization he experienced AKI and was treated with fluids and correction of hypoperfusion from cardiac arrest.  Echo performed this admission revealed ischemic cardiomyopathy with an EF of 40%  In addition to above patient has been unable to pass swallowing evaluations and subsequently has undergone percutaneous PEG tube placement on 12/30.  Patient is awake and moving all extremities spontaneously but does not follow commands and does not verbalize palliative care has met with patient and family and has emphasized poor prognosis and will continue to meet with the family during the hospitalization.  Current plan is to transition to SNF bed once available.  Expect need for lifelong 24/7 care for all ADLs.  Pending placement, inpatient rehab is reviewing patient.  Assessment & Plan   Cardiac arrest secondary to STEMI -occurred outside of the hospital -ROSC and subsequent catheterization revealed disease in the circumflex requiring DES -Continue Coreg, statin, BiDil, Brilinta, aspirin  Ischemic cardiomyopathy -Echocardiogram showed an EF of 40% -Currently not on ACE inhibitor due to underlying kidney disease -Appears to be euvolemic and  compensated -Monitor intake and output, daily weights  Significant anoxic brain injury secondary to cardiac arrest -Patient is alert and oriented to self at this time -He is awake and participating with PT and OT as well as speech therapy -Currently on full liquid diet -Inpatient rehab consulted and pending family ability to provide care after rehab -Currently no hypertonicity or spasticity -Continue Seroquel and Symmetrel  Essential hypertension -Patient was on amlodipine prior to admission -Continue Coreg, BiDil  Nonsustained VT -Continue Coreg, amiodarone -Cardiology consulted and appreciated  Acute kidney injury on chronic disease, stage IIIb -Likely prerenal given hypoperfusion from cardiac arrest -Baseline creatinine approximate 1.6-2, currently 1.3 -Continue to monitor BMP intermittently -Continue free water per tube  Dysphagia, moderate protein calorie malnutrition -Speech therapy has been working with patient, currently on full liquid diet -Patient does have PEG tube in place  Skin tear right lateral back -Continue wound care and dressing changes  Hypernatremia -Continue free water per tube -Resolved  Hyperglycemia -Patient without a history or diagnosis of diabetes mellitus -Suspect secondary to stress -Hemoglobin A1c 4.7 -Continue Levemir, sliding scale, CBG monitoring  Acute hypoxemic respiratory failure -Resolved, currently on room air and maintaining oxygen saturations in the high 90s   DVT Prophylaxis  heparin  Code Status: Full  Family Communication: None at bedside  Disposition Plan:  Status is: Inpatient  Remains inpatient appropriate because:Unsafe d/c plan   Dispo: The patient is from: Home              Anticipated d/c is to: SNF vs CIR              Anticipated d/c date is: > 3 days  Patient currently is medically stable to d/c.  Consultants Cardiology Interventional radiology PCCM Neurology Inpatient  rehab  Procedures  Cardiac catheterization 12/4 Continuous EEG monitoring 12/4 and overnight EEG with video 12/5 Echocardiogram 12/5 EEG 12/9 Percutaneous PEG tube placement 12/30  Antibiotics   Anti-infectives (From admission, onward)   Start     Dose/Rate Route Frequency Ordered Stop   08/17/20 0000  ceFAZolin (ANCEF) IVPB 2g/100 mL premix        2 g 200 mL/hr over 30 Minutes Intravenous To Radiology 08/15/20 1346 08/17/20 1111   07/22/20 0830  cefTRIAXone (ROCEPHIN) 2 g in sodium chloride 0.9 % 100 mL IVPB        2 g 200 mL/hr over 30 Minutes Intravenous Every 24 hours 07/22/20 0720 07/26/20 0858      Subjective:   Synetta Fail seen and examined today.  (Patient with anoxic brain injury.)  No complaints of pain at this time.  Can answer simple questions.  Objective:   Vitals:   08/25/20 1204 08/25/20 2005 08/26/20 0600 08/26/20 0824  BP: 108/74 123/89  135/89  Pulse: 80 79  81  Resp: '19 18  16  ' Temp: 97.8 F (36.6 C) 98.4 F (36.9 C)    TempSrc: Oral Oral    SpO2: 100% 100%  100%  Weight:   89.8 kg   Height:        Intake/Output Summary (Last 24 hours) at 08/26/2020 1102 Last data filed at 08/26/2020 0323 Gross per 24 hour  Intake 2680 ml  Output 2150 ml  Net 530 ml   Filed Weights   08/24/20 0051 08/25/20 0024 08/26/20 0600  Weight: 89.3 kg 89 kg 89.8 kg    Exam  General: Well developed, chronically ill-appearing, NAD  HEENT: NCAT, mucous membranes moist.   Cardiovascular: S1 S2 auscultated, RRR.  Respiratory: Diminished breath sounds however clear  Abdomen: Soft, nontender, nondistended, + bowel sounds, PEG tube in place  Extremities: warm dry without cyanosis clubbing or edema  Neuro: AAOx 1 (self only), patient with decreased strength in the upper extremities, 3-4 out of 5, although able to move all extremities with ease.  Psych: flat, however cannot fully assess   Data Reviewed: I have personally reviewed following labs and imaging  studies  CBC: Recent Labs  Lab 08/25/20 0303 08/26/20 0425  WBC 7.3 7.2  HGB 10.9* 11.7*  HCT 34.6* 37.3*  MCV 106.1* 105.7*  PLT 162 559   Basic Metabolic Panel: Recent Labs  Lab 08/25/20 0303 08/26/20 0425  NA 137 140  K 4.1 4.2  CL 99 101  CO2 28 30  GLUCOSE 146* 146*  BUN 20 20  CREATININE 1.34* 1.30*  CALCIUM 9.5 9.8   GFR: Estimated Creatinine Clearance: 68 mL/min (A) (by C-G formula based on SCr of 1.3 mg/dL (H)). Liver Function Tests: Recent Labs  Lab 08/25/20 0303  AST 58*  ALT 133*  ALKPHOS 85  BILITOT 0.7  PROT 7.0  ALBUMIN 3.2*   No results for input(s): LIPASE, AMYLASE in the last 168 hours. No results for input(s): AMMONIA in the last 168 hours. Coagulation Profile: No results for input(s): INR, PROTIME in the last 168 hours. Cardiac Enzymes: No results for input(s): CKTOTAL, CKMB, CKMBINDEX, TROPONINI in the last 168 hours. BNP (last 3 results) No results for input(s): PROBNP in the last 8760 hours. HbA1C: No results for input(s): HGBA1C in the last 72 hours. CBG: Recent Labs  Lab 08/25/20 1130 08/25/20 1611 08/25/20 2058 08/26/20 0129 08/26/20 7416  GLUCAP 162* 110* 119* 137* 119*   Lipid Profile: No results for input(s): CHOL, HDL, LDLCALC, TRIG, CHOLHDL, LDLDIRECT in the last 72 hours. Thyroid Function Tests: No results for input(s): TSH, T4TOTAL, FREET4, T3FREE, THYROIDAB in the last 72 hours. Anemia Panel: No results for input(s): VITAMINB12, FOLATE, FERRITIN, TIBC, IRON, RETICCTPCT in the last 72 hours. Urine analysis:    Component Value Date/Time   COLORURINE YELLOW 03/08/2019 1115   APPEARANCEUR CLEAR 03/08/2019 1115   LABSPEC 1.020 03/08/2019 1115   PHURINE 5.0 03/08/2019 1115   GLUCOSEU NEGATIVE 03/08/2019 1115   HGBUR NEGATIVE 03/08/2019 1115   BILIRUBINUR NEGATIVE 03/08/2019 1115   [place] 03/08/2019 1115   PROTEINUR NEGATIVE 03/08/2019 1115   NITRITE NEGATIVE 03/08/2019 1115   LEUKOCYTESUR SMALL (A)  03/08/2019 1115   Sepsis Labs: '@LABRCNTIP' (procalcitonin:4,lacticidven:4)  )No results found for this or any previous visit (from the past 240 hour(s)).    Radiology Studies: No results found.   Scheduled Meds: . amantadine  100 mg Per Tube Daily  . aspirin  81 mg Per Tube Daily  . atorvastatin  80 mg Per Tube Daily  . bethanechol  10 mg Per Tube TID  . carvedilol  25 mg Per Tube BID WC  . chlorhexidine gluconate (MEDLINE KIT)  15 mL Mouth Rinse BID  . feeding supplement (PROSource TF)  45 mL Per Tube BID  . free water  200 mL Per Tube Q3H  . heparin  5,000 Units Subcutaneous Q8H  . insulin aspart  0-9 Units Subcutaneous Q4H  . insulin detemir  30 Units Subcutaneous BID  . isosorbide-hydrALAZINE  1 tablet Per Tube TID  . mouth rinse  15 mL Mouth Rinse BID  . polyvinyl alcohol  1 drop Both Eyes QID  . QUEtiapine  25 mg Per Tube QHS  . sodium chloride flush  3 mL Intravenous Q12H  . ticagrelor  90 mg Per Tube BID   Continuous Infusions: . sodium chloride 250 mL (08/17/20 0812)  . feeding supplement (JEVITY 1.5 CAL/FIBER) 1,000 mL (08/23/20 1235)     LOS: 35 days   Time Spent in minutes   30 minutes  Michaella Imai D.O. on 08/26/2020 at 11:02 AM  Between 7am to 7pm - Please see pager noted on [email or web address]  After 7pm go to [email or web address]  And look for the night coverage person covering for me after hours  [facility] Office  [phone]

## 2020-08-27 LAB — GLUCOSE, CAPILLARY
Glucose-Capillary: 126 mg/dL — ABNORMAL HIGH (ref 70–99)
Glucose-Capillary: 141 mg/dL — ABNORMAL HIGH (ref 70–99)
Glucose-Capillary: 143 mg/dL — ABNORMAL HIGH (ref 70–99)
Glucose-Capillary: 147 mg/dL — ABNORMAL HIGH (ref 70–99)
Glucose-Capillary: 163 mg/dL — ABNORMAL HIGH (ref 70–99)
Glucose-Capillary: 197 mg/dL — ABNORMAL HIGH (ref 70–99)

## 2020-08-27 NOTE — Plan of Care (Signed)
  Problem: Clinical Measurements: Goal: Respiratory complications will improve Outcome: Progressing   Problem: Clinical Measurements: Goal: Will remain free from infection Outcome: Progressing   

## 2020-08-27 NOTE — Progress Notes (Addendum)
PROGRESS NOTE    Antonio Navarro  QQV:[id number] DOB: 05/21/1961 DOA: 07/22/2020 PCP: Patient, No Pcp Per   Brief Narrative:  60 year old male history of hypertension prescribed Norvasc prior to admission, chronic back pain, prior renal calculi who experienced an out of hospital cardiac arrest with downtime of 9 minutes.  Initial rhythm was ventricular fibrillation.  He was defibrillated deceived 6 doses of epi before ROSC.  Subsequent work-up revealed STEMI.  He underwent cardiac catheterization with a DES placed to the circumflex.  Cardiology following and has placed patient on Brilinta and aspirin and is also being treated with beta-blocker and statins.  From a neurological standpoint he clinically has anoxic brain injury.  During this hospitalization he experienced AKI and was treated with fluids and correction of hypoperfusion from cardiac arrest.  Echo performed this admission revealed ischemic cardiomyopathy with an EF of 40%  In addition to above patient has been unable to pass swallowing evaluations and subsequently has undergone percutaneous PEG tube placement on 12/30.  Patient is awake and moving all extremities spontaneously but does not follow commands and does not verbalize palliative care has met with patient and family and has emphasized poor prognosis and will continue to meet with the family during the hospitalization.  Current plan is to transition to SNF bed once available.  Expect need for lifelong 24/7 care for all ADLs.  Pending placement, inpatient rehab is reviewing patient.  Assessment & Plan   Cardiac arrest secondary to STEMI -occurred outside of the hospital -ROSC and subsequent catheterization revealed disease in the circumflex requiring DES -Continue Coreg, statin, BiDil, Brilinta, aspirin  Ischemic cardiomyopathy -Echocardiogram showed an EF of 40% -Currently not on ACE inhibitor due to underlying kidney disease -Appears to be euvolemic and  compensated -Monitor intake and output, daily weights  Significant anoxic brain injury secondary to cardiac arrest -Patient is alert and oriented to self at this time -He is awake and participating with PT and OT as well as speech therapy -Currently on full liquid diet -Inpatient rehab consulted and pending family ability to provide care after rehab -Currently no hypertonicity or spasticity -Continue Seroquel and Symmetrel  Essential hypertension -Patient was on amlodipine prior to admission -Continue Coreg, BiDil  Nonsustained VT -Continue Coreg, amiodarone -Cardiology consulted and appreciated  Acute kidney injury on chronic disease, stage IIIb -Likely prerenal given hypoperfusion from cardiac arrest -Baseline creatinine approximate 1.6-2, currently 1.3 -Continue to monitor BMP intermittently -Continue free water per tube  Dysphagia, moderate protein calorie malnutrition -Speech therapy has been working with patient, currently on full liquid diet -Patient does have PEG tube in place  Skin tear right lateral back -Continue wound care and dressing changes  Hypernatremia -Continue free water per tube -Resolved  Hyperglycemia -Patient without a history or diagnosis of diabetes mellitus -Suspect secondary to stress -Hemoglobin A1c 4.7 -Continue Levemir, sliding scale, CBG monitoring  Acute hypoxemic respiratory failure -Resolved, currently on room air and maintaining oxygen saturations in the high 90s   DVT Prophylaxis  heparin  Code Status: Full  Family Communication: None at bedside  Disposition Plan:  Status is: Inpatient  Remains inpatient appropriate because:Unsafe d/c plan   Dispo: The patient is from: Home              Anticipated d/c is to: SNF vs CIR              Anticipated d/c date is: > 3 days  Patient currently is medically stable to d/c.  Consultants Cardiology Interventional radiology PCCM Neurology Inpatient  rehab  Procedures  Cardiac catheterization 12/4 Continuous EEG monitoring 12/4 and overnight EEG with video 12/5 Echocardiogram 12/5 EEG 12/9 Percutaneous PEG tube placement 12/30  Antibiotics   Anti-infectives (From admission, onward)   Start     Dose/Rate Route Frequency Ordered Stop   08/17/20 0000  ceFAZolin (ANCEF) IVPB 2g/100 mL premix        2 g 200 mL/hr over 30 Minutes Intravenous To Radiology 08/15/20 1346 08/17/20 1111   07/22/20 0830  cefTRIAXone (ROCEPHIN) 2 g in sodium chloride 0.9 % 100 mL IVPB        2 g 200 mL/hr over 30 Minutes Intravenous Every 24 hours 07/22/20 0720 07/26/20 0858      Subjective:   Antonio Navarro seen and examined today.  (Patient with anoxic brain injury.)  No complaints of pain at this time.  Objective:   Vitals:   08/27/20 0433 08/27/20 0600 08/27/20 0712 08/27/20 1030  BP: (!) 124/94  (!) 157/91 (!) 188/93  Pulse: 88  81 88  Resp: 16  20   Temp: 98.3 F (36.8 C)     TempSrc: Oral     SpO2: 99%  100% 100%  Weight:  88.6 kg    Height:        Intake/Output Summary (Last 24 hours) at 08/27/2020 1115 Last data filed at 08/27/2020 0749 Gross per 24 hour  Intake 2390 ml  Output 1500 ml  Net 890 ml   Filed Weights   08/25/20 0024 08/26/20 0600 08/27/20 0600  Weight: 89 kg 89.8 kg 88.6 kg   Exam  General: Well developed, chronically ill-appearing, NAD  HEENT: NCAT, mucous membranes moist.   Cardiovascular: S1 S2 auscultated, RRR  Respiratory: Diminished however clear  Abdomen: Soft, nontender, nondistended, + bowel sounds, PEG tube in place  Extremities: warm dry without cyanosis clubbing or edema  Neuro: AAOx 1 (self only) can move all extremities with ease  Data Reviewed: I have personally reviewed following labs and imaging studies  CBC: Recent Labs  Lab 08/25/20 0303 08/26/20 0425  WBC 7.3 7.2  HGB 10.9* 11.7*  HCT 34.6* 37.3*  MCV 106.1* 105.7*  PLT 162 374   Basic Metabolic Panel: Recent Labs  Lab  08/25/20 0303 08/26/20 0425  NA 137 140  K 4.1 4.2  CL 99 101  CO2 28 30  GLUCOSE 146* 146*  BUN 20 20  CREATININE 1.34* 1.30*  CALCIUM 9.5 9.8   GFR: Estimated Creatinine Clearance: 68 mL/min (A) (by C-G formula based on SCr of 1.3 mg/dL (H)). Liver Function Tests: Recent Labs  Lab 08/25/20 0303  AST 58*  ALT 133*  ALKPHOS 85  BILITOT 0.7  PROT 7.0  ALBUMIN 3.2*   No results for input(s): LIPASE, AMYLASE in the last 168 hours. No results for input(s): AMMONIA in the last 168 hours. Coagulation Profile: No results for input(s): INR, PROTIME in the last 168 hours. Cardiac Enzymes: No results for input(s): CKTOTAL, CKMB, CKMBINDEX, TROPONINI in the last 168 hours. BNP (last 3 results) No results for input(s): PROBNP in the last 8760 hours. HbA1C: No results for input(s): HGBA1C in the last 72 hours. CBG: Recent Labs  Lab 08/26/20 1632 08/26/20 2151 08/27/20 0008 08/27/20 0432 08/27/20 0732  GLUCAP 119* 157* 147* 143* 163*   Lipid Profile: No results for input(s): CHOL, HDL, LDLCALC, TRIG, CHOLHDL, LDLDIRECT in the last 72 hours. Thyroid Function Tests: No  results for input(s): TSH, T4TOTAL, FREET4, T3FREE, THYROIDAB in the last 72 hours. Anemia Panel: No results for input(s): VITAMINB12, FOLATE, FERRITIN, TIBC, IRON, RETICCTPCT in the last 72 hours. Urine analysis:    Component Value Date/Time   COLORURINE YELLOW 03/08/2019 1115   APPEARANCEUR CLEAR 03/08/2019 1115   LABSPEC 1.020 03/08/2019 1115   PHURINE 5.0 03/08/2019 1115   GLUCOSEU NEGATIVE 03/08/2019 1115   HGBUR NEGATIVE 03/08/2019 1115   BILIRUBINUR NEGATIVE 03/08/2019 1115   [place] 03/08/2019 1115   PROTEINUR NEGATIVE 03/08/2019 1115   NITRITE NEGATIVE 03/08/2019 1115   LEUKOCYTESUR SMALL (A) 03/08/2019 1115   Sepsis Labs: '@LABRCNTIP' (procalcitonin:4,lacticidven:4)  )No results found for this or any previous visit (from the past 240 hour(s)).    Radiology Studies: No results  found.   Scheduled Meds: . amantadine  100 mg Per Tube Daily  . aspirin  81 mg Per Tube Daily  . atorvastatin  80 mg Per Tube Daily  . bethanechol  10 mg Per Tube TID  . carvedilol  25 mg Per Tube BID WC  . chlorhexidine gluconate (MEDLINE KIT)  15 mL Mouth Rinse BID  . feeding supplement (PROSource TF)  45 mL Per Tube BID  . free water  200 mL Per Tube Q3H  . heparin  5,000 Units Subcutaneous Q8H  . insulin aspart  0-9 Units Subcutaneous Q4H  . insulin detemir  30 Units Subcutaneous BID  . isosorbide-hydrALAZINE  1 tablet Per Tube TID  . mouth rinse  15 mL Mouth Rinse BID  . polyvinyl alcohol  1 drop Both Eyes QID  . QUEtiapine  25 mg Per Tube QHS  . sodium chloride flush  3 mL Intravenous Q12H  . ticagrelor  90 mg Per Tube BID   Continuous Infusions: . sodium chloride 250 mL (08/17/20 0812)  . feeding supplement (JEVITY 1.5 CAL/FIBER) 1,000 mL (08/27/20 0736)     LOS: 36 days   Time Spent in minutes   20 minutes  Rashan Rounsaville D.O. on 08/27/2020 at 11:15 AM  Between 7am to 7pm - Please see pager noted on [email or web address]  After 7pm go to [email or web address]  And look for the night coverage person covering for me after hours  [facility] Office  [phone]

## 2020-08-27 NOTE — Plan of Care (Signed)
  Problem: Activity: Goal: Risk for activity intolerance will decrease Outcome: Progressing   Problem: Coping: Goal: Level of anxiety will decrease Outcome: Progressing   Problem: Safety: Goal: Ability to remain free from injury will improve Outcome: Progressing   

## 2020-08-28 DIAGNOSIS — R2681 Unsteadiness on feet: Secondary | ICD-10-CM

## 2020-08-28 LAB — GLUCOSE, CAPILLARY
Glucose-Capillary: 126 mg/dL — ABNORMAL HIGH (ref 70–99)
Glucose-Capillary: 142 mg/dL — ABNORMAL HIGH (ref 70–99)
Glucose-Capillary: 142 mg/dL — ABNORMAL HIGH (ref 70–99)
Glucose-Capillary: 144 mg/dL — ABNORMAL HIGH (ref 70–99)
Glucose-Capillary: 151 mg/dL — ABNORMAL HIGH (ref 70–99)

## 2020-08-28 NOTE — Progress Notes (Signed)
Physical Therapy Treatment Patient Details Name: Antonio Navarro MRN: [id number] DOB: 12/18/1960 Today's Date: 08/28/2020    History of Present Illness [name] with history of tobacco abuse, obesity, HTN who was admitted 07/22/20 post cardiac arrest with inferolateral STEMI. He was resuscitated in the field by EMS after being found to be in V fib. Approximately 30 minutes of CPR. He was intubated on arrival.  Pt with anoxic brain injury. PEG placed on 08/17/20    PT Comments    Pt is up to side of bed with several practice stands done, and note his struggle to both initiate and maintain standing control.  Pt is interested in being up but is cognitively struggling with the series of events that must happen to stand up reliably.  Follow pt acutely for further strengthening, balance training and reinforcement of upright posture and safety with standing balance and gait.  Pt will be encouraged to be OOB as his willingness permits.  Follow Up Recommendations  SNF     Equipment Recommendations  Other (comment)    Recommendations for Other Services       Precautions / Restrictions Precautions Precautions: Fall Precaution Comments: mitts, gtube Restrictions Weight Bearing Restrictions: No    Mobility  Bed Mobility Overal bed mobility: Needs Assistance Bed Mobility: Supine to Sit;Sit to Supine Rolling: Min assist Sidelying to sit: Mod assist Supine to sit: Mod assist Sit to supine: Mod assist Sit to sidelying: Mod assist    Transfers Overall transfer level: Needs assistance Equipment used: 1 person hand held assist Transfers: Sit to/from Stand Sit to Stand: Mod assist Stand pivot transfers: Mod assist       General transfer comment: STS practice with one person but had to sequence and cue for pt  Ambulation/Gait             General Gait Details: unsafe to walk away from the bed   Stairs             Wheelchair Mobility    Modified Rankin (Stroke  Patients Only)       Balance     Sitting balance-Leahy Scale: Fair       Standing balance-Leahy Scale: Poor                              Cognition Arousal/Alertness: Awake/alert Behavior During Therapy: Flat affect Overall Cognitive Status: Impaired/Different from baseline Area of Impairment: Problem solving;Awareness;Safety/judgement;Following commands;Memory;Attention;Orientation               Rancho Levels of Cognitive Functioning Rancho Los Amigos Scales of Cognitive Functioning: Confused/inappropriate/non-agitated Orientation Level: Situation;Place;Time Current Attention Level: Selective Memory: Decreased recall of precautions;Decreased short-term memory Following Commands: Follows one step commands inconsistently;Follows one step commands with increased time Safety/Judgement: Decreased awareness of safety;Decreased awareness of deficits Awareness: Intellectual Problem Solving: Slow processing;Difficulty sequencing;Requires verbal cues;Requires tactile cues General Comments: delayed response to cues and requestss      Exercises      General Comments General comments (skin integrity, edema, etc.): pt was assisted to use bed rail and bed to support standing effort but does not follow through      Pertinent Vitals/Pain Pain Assessment: No/denies pain    Home Living                      Prior Function            PT Goals (current goals  can now be found in the care plan section) Acute Rehab PT Goals Patient Stated Goal: family asking to get stronger Progress towards PT goals: Not progressing toward goals - comment    Frequency    Min 3X/week      PT Plan Discharge plan needs to be updated    Co-evaluation              AM-PAC PT "6 Clicks" Mobility   Outcome Measure  Help needed turning from your back to your side while in a flat bed without using bedrails?: A Little Help needed moving from lying on your back to  sitting on the side of a flat bed without using bedrails?: A Little Help needed moving to and from a bed to a chair (including a wheelchair)?: A Lot Help needed standing up from a chair using your arms (e.g., wheelchair or bedside chair)?: A Lot Help needed to walk in hospital room?: A Lot Help needed climbing 3-5 steps with a railing? : A Lot 6 Click Score: 14    End of Session Equipment Utilized During Treatment: Gait belt Activity Tolerance: Patient tolerated treatment well Patient left: with call bell/phone within reach;in bed;with bed alarm set Nurse Communication: Mobility status PT Visit Diagnosis: Other abnormalities of gait and mobility (R26.89)     Time: 6811-5726 PT Time Calculation (min) (ACUTE ONLY): 24 min  Charges:  $Therapeutic Exercise: 8-22 mins $Therapeutic Activity: 8-22 mins                  Ramond Dial 08/28/2020, 5:14 PM  Mee Hives, PT MS Acute Rehab Dept. Number: [place] and [place]

## 2020-08-28 NOTE — Progress Notes (Signed)
CSW was notified by CIR staff that the patient's family is unable to obtain 24/7 support after discharge so SNF placement is needed. CSW sent updated therapy notes to several facilities for review. Patient's Medicaid still pending at this time.  [name], MSW, LCSW-A Transitions of Care  Clinical Social Worker I [phone]

## 2020-08-28 NOTE — Progress Notes (Signed)
Inpatient Rehabilitation Admissions Coordinator  I spoke by phone with sister, Maudie Mercury , by phone to get clarification of family 24/7 caregiver support availability. She states they can not have this arranged at this time. They would like to continue to seek SNF and would like to get updates from [facility] on placement. I have alerted acute team and TOC. I have requested sister, Maudie Mercury, to contact me if family is able to arrange 24/7 assist at home. We will sign off at this time.  Danne Baxter, RN, MSN Rehab Admissions Coordinator [phone] 08/28/2020 11:08 AM

## 2020-08-28 NOTE — Progress Notes (Addendum)
[facility] PROGRESS NOTE  ABDELAZIZ WESTENBERGER ULA:[id number] DOB: 08/20/1960 DOA: 07/22/2020 PCP: Patient, No Pcp Per  Status: Remains inpatient appropriate because:Altered mental status and Unsafe d/c plan   Dispo: The patient is from: Home              Anticipated d/c is to: SNF              Anticipated d/c date is: > 3 days              Patient currently is medically stable to d/c.  Barriers to discharge: Medicaid and disability pending.Family unable to provide adequate care 24/7 after dc so declined by CIR-focus is now on SNF   Code Status: Full Family Communication: 1/06 spoke with patient's sister [name]  DVT prophylaxis: sq heparin Vaccination status: Completed both doses of Pfizer Covid vaccination on 12/14/2019  Foley catheter: No  HPI: 60 year old male history of hypertension prescribed Norvasc prior to admission, chronic back pain, prior renal calculi who experienced an out of hospital cardiac arrest with downtime of 9 minutes.  Initial rhythm was ventricular fibrillation.  He was defibrillated deceived 6 doses of epi before ROSC.  Subsequent work-up revealed STEMI.  He underwent cardiac catheterization with a DES placed to the circumflex.  Cardiology following and has placed patient on Brilinta and aspirin and is also being treated with beta-blocker and statins.  From a neurological standpoint he clinically has anoxic brain injury.  During this hospitalization he experienced AKI and was treated with fluids and correction of hypoperfusion from cardiac arrest.  Echo performed this admission revealed ischemic cardiomyopathy with an EF of 40%  In addition to above patient has been unable to pass swallowing evaluations and subsequently has undergone percutaneous PEG tube placement on 12/30.  Patient is awake and moving all extremities spontaneously but does not follow commands and does not verbalize palliative care has met with patient and family and has emphasized poor prognosis  and will continue to meet with the family during the hospitalization.  Current plan is to transition to SNF bed once available.  Expect need for lifelong 24/7 care for all ADLs.  Subjective: Awakened from sleep by speaking to patient and touching shoulder.  Patient became extremely startled and kept arms pulled up next to the chest.  Mouth no to all questions asked.  Was not oriented and unable to answer orientation questions accurately.  Objective: Vitals:   08/27/20 2102 08/28/20 0510  BP: 122/84 121/87  Pulse: 85 85  Resp: 16 16  Temp: 98.5 F (36.9 C) 97.7 F (36.5 C)  SpO2: 96% 98%    Intake/Output Summary (Last 24 hours) at 08/28/2020 1040 Last data filed at 08/28/2020 0503 Gross per 24 hour  Intake 3 ml  Output 2051 ml  Net -2048 ml   Filed Weights   08/26/20 0600 08/27/20 0600 08/28/20 0054  Weight: 89.8 kg 88.6 kg 89.1 kg    Exam:  Constitutional: Awakened.  Appeared quite anxious afterwards Respiratory: Anterior lung sounds clear to auscultation, stable on room air Cardiovascular: S1-S2, pulse regular, no edema Abdomen: Normoactive bowel sounds, soft and nontender.  PEG unremarkable, LBM 1/08 Neurologic: Annual nerves II through XII are grossly intact without any focal neural deficits cruciated otherwise, moves all extremities equally strength 4/5 Psychiatric: Awake and and was quite startled and remained hesitant and anxious afterwards.  Whispered verbal responses and was disoriented.  Unable to repeat back name today.   Assessment/Plan: Acute problems: Out of hospital cardiac arrest  secondary to STEMI -ROSC and subsequent catheterization revealed disease in circumflex requiring DES -Continue beta-blocker, statin; Brilinta which had been held briefly for PEG tube resumed on 12/30 by cardiology  Ischemic cardiomyopathy -Echo this admission EF 40% -Continue pharmacotherapy as above -No ACE inhibitor secondary to underlying kidney disease -Continue hydralazine  and Isordil -Euvolemic -Likely will need follow-up echo in 3 months from initial echo  Significant anoxic brain injury secondary to cardiac arrest/deconditioning/gait instability -Has awakened, is participating with PT and OT.  SLP has cleared for full liquid -Awaiting confirmation from family that they can provide 24/7 care for several weeks after discharge from CIR -No hypertonicity or spasticity observed -Continue Seroquel 25 mg at HS for insomnia and brain injury sequela -Continue daytime Symmetrel to help contain daytime alertness in context of brain injury  Hypertension -Was on Norvasc prior to admission -Continue cardiovascular meds as listed above  Nonsustained VT -Continue amiodarone and beta-blocker -LFTs were repeated on 1/7 with stable AST but ALT has increased from 52 to 133 -we will need to repeat again in 1 to 2 weeks to ensure stability -Cardiology managing  Acute on chronic kidney disease stage IIIb -Suspect prerenal etiology in the setting of hypoperfusion from ventricular fibrillation arrest -Baseline creatinine between 1.6 and 2; creatinine on 12/19 was 1.67-will repeat in a.m. on 08/19/2020 -Continue free water per tube and intermittently follow labs  Dysphagia, moderate protein calorie malnutrition Nutrition Problem: Inadequate oral intake Etiology: inability to eat Signs/Symptoms: NPO status-as of 1/5 SLP has cleared for thin liquids-given difficulty with purposeful movement especially fine motor movement patient will likely need to be assisted with diet therefore order placed Interventions: Tube feeding,Prostat Estimated body mass index is 26.64 kg/m as calculated from the following:   Height as of this encounter: 6' (1.829 m).   Weight as of this encounter: 89.1 kg.  Skin tear right lateral back Wound / Incision (Open or Dehisced) 07/28/20 Skin tear Back Lateral;Right;Upper (Active)  Date First Assessed/Time First Assessed: 07/28/20 0300   Wound Type: Skin  tear  Location: Back  Location Orientation: Lateral;Right;Upper  Present on Admission: No    Assessments 07/28/2020  7:49 PM 08/26/2020  9:48 AM  Dressing Type None Foam - Lift dressing to assess site every shift  Dressing Changed -- New  Dressing Status -- Clean;Dry;Intact  Dressing Change Frequency -- PRN  Site / Wound Assessment -- Clean;Dry  Drainage Amount None --  Treatment Cleansed --     No Linked orders to display     Wound / Incision (Open or Dehisced) 08/04/20 (MASD) Moisture Associated Skin Damage Buttocks Right;Left (Active)  Date First Assessed/Time First Assessed: 08/04/20 0800   Wound Type: (MASD) Moisture Associated Skin Damage  Location: Buttocks  Location Orientation: Right;Left    Assessments 08/04/2020  8:16 AM 08/26/2020  9:48 AM  Dressing Type Foam - Lift dressing to assess site every shift --  Dressing Changed New --  Dressing Status Clean;Dry;Intact Clean;Dry;Intact  Dressing Change Frequency PRN --  Treatment Cleansed --     No Linked orders to display    Other problems: Acute hypernatremia -Continue free water per tube  Hyperglycemia without an underlying diagnosis of diabetes mellitus -Likely related to acute stress -CBGs while on tube feedings controlled with sliding scale insulin and high-dose Levemir insulin BID CBGs well-controlled at this juncture -Consider change to Glucerna tube feeding Levemir dosage can be decreased to minimize risk for unintended hypoglycemia -Hemoglobin A1c 4.7   Acute hypoxemic respiratory failure -Resolved -Stable on  room air with O2 sats between 97 and 99%   Data Reviewed: Basic Metabolic Panel: Recent Labs  Lab 08/25/20 0303 08/26/20 0425  NA 137 140  K 4.1 4.2  CL 99 101  CO2 28 30  GLUCOSE 146* 146*  BUN 20 20  CREATININE 1.34* 1.30*  CALCIUM 9.5 9.8   Liver Function Tests: Recent Labs  Lab 08/25/20 0303  AST 58*  ALT 133*  ALKPHOS 85  BILITOT 0.7  PROT 7.0  ALBUMIN 3.2*   No results for  input(s): LIPASE, AMYLASE in the last 168 hours. No results for input(s): AMMONIA in the last 168 hours. CBC: Recent Labs  Lab 08/25/20 0303 08/26/20 0425  WBC 7.3 7.2  HGB 10.9* 11.7*  HCT 34.6* 37.3*  MCV 106.1* 105.7*  PLT 162 160   Cardiac Enzymes: No results for input(s): CKTOTAL, CKMB, CKMBINDEX, TROPONINI in the last 168 hours. BNP (last 3 results) No results for input(s): BNP in the last 8760 hours.  ProBNP (last 3 results) No results for input(s): PROBNP in the last 8760 hours.  CBG: Recent Labs  Lab 08/27/20 1135 08/27/20 1636 08/27/20 1956 08/28/20 0014 08/28/20 0456  GLUCAP 197* 126* 141* 151* 126*    No results found for this or any previous visit (from the past 240 hour(s)).   Studies: No results found.  Scheduled Meds: . amantadine  100 mg Per Tube Daily  . aspirin  81 mg Per Tube Daily  . atorvastatin  80 mg Per Tube Daily  . bethanechol  10 mg Per Tube TID  . carvedilol  25 mg Per Tube BID WC  . chlorhexidine gluconate (MEDLINE KIT)  15 mL Mouth Rinse BID  . feeding supplement (PROSource TF)  45 mL Per Tube BID  . free water  200 mL Per Tube Q3H  . heparin  5,000 Units Subcutaneous Q8H  . insulin aspart  0-9 Units Subcutaneous Q4H  . insulin detemir  30 Units Subcutaneous BID  . isosorbide-hydrALAZINE  1 tablet Per Tube TID  . mouth rinse  15 mL Mouth Rinse BID  . polyvinyl alcohol  1 drop Both Eyes QID  . QUEtiapine  25 mg Per Tube QHS  . sodium chloride flush  3 mL Intravenous Q12H  . ticagrelor  90 mg Per Tube BID   Continuous Infusions: . sodium chloride 250 mL (08/17/20 0812)  . feeding supplement (JEVITY 1.5 CAL/FIBER) 1,000 mL (08/27/20 0736)    Active Problems:   Acute ST elevation myocardial infarction (STEMI) [facility])   Cardiac arrest (HCC)   Elevated blood pressure reading with diagnosis of hypertension   Shock circulatory (HCC)   Encephalopathy   History of ETT   Cardiomyopathy, ischemic   Anoxic brain damage [facility])    Dysphagia   DNR (do not resuscitate) discussion   Consultants:  Cardiology  Interventional radiology  PCCM  Neurology  Procedures:  Cardiac catheterization 12/4  Continuous EEG 12/4 and overnight EEG with video 12/5  Echocardiogram 12/5  EEG 12/9  Percutaneous PEG tube placement 12/30  Antibiotics: Anti-infectives (From admission, onward)   Start     Dose/Rate Route Frequency Ordered Stop   08/17/20 0000  ceFAZolin (ANCEF) IVPB 2g/100 mL premix        2 g 200 mL/hr over 30 Minutes Intravenous To Radiology 08/15/20 1346 08/17/20 1111   07/22/20 0830  cefTRIAXone (ROCEPHIN) 2 g in sodium chloride 0.9 % 100 mL IVPB        2 g 200 mL/hr over 30  Minutes Intravenous Every 24 hours 07/22/20 0720 07/26/20 0858       Time spent: 20 minutes    Erin Hearing ANP  [facility] 7 am - 330 pm/M-F for direct patient care and secure chat Please refer to Amion for contact information 37 days

## 2020-08-28 NOTE — Plan of Care (Signed)
  Problem: Health Behavior/Discharge Planning: Goal: Ability to manage health-related needs will improve Outcome: Progressing   

## 2020-08-29 LAB — GLUCOSE, CAPILLARY
Glucose-Capillary: 161 mg/dL — ABNORMAL HIGH (ref 70–99)
Glucose-Capillary: 70 mg/dL (ref 70–99)
Glucose-Capillary: 136 mg/dL — ABNORMAL HIGH (ref 70–99)
Glucose-Capillary: 166 mg/dL — ABNORMAL HIGH (ref 70–99)
Glucose-Capillary: 146 mg/dL — ABNORMAL HIGH (ref 70–99)
Glucose-Capillary: 125 mg/dL — ABNORMAL HIGH (ref 70–99)

## 2020-08-29 NOTE — Progress Notes (Signed)
  Speech Language Pathology Treatment: Dysphagia;Cognitive-Linquistic  Patient Details Name: Antonio Navarro MRN: [id number] DOB: 06/19/1961 Today's Date: 08/29/2020 Time: 3151-7616 SLP Time Calculation (min) (ACUTE ONLY): 18 min  Assessment / Plan / Recommendation Clinical Impression  Pt consumed soft solids today, demonstrating good biting and mastication although needing Min-Mod cues to initiate swallowing after he has prepared the bolus. Offering a thin liquid wash also helps. Pt was given opportunities for communication throughout the session and cues for initiation, producing a few single-word responses. Min cues were provided for simple command following within functional activities. Education on current level of function from a swallowing and cognitive/communicative standpoint was provided to his sister, Antonio Navarro. Will advance to Dys 2 (finely chopped) diet and thin liquids. Will continue to follow acutely.    HPI HPI: Pt is a 60 yo male s/p arrest with downtime 9 minutes. Initial rhythm was V. fib, shocked and received 6 rounds of epi before ROSC achieved.  Total CPR time was 30 minutes. ETT 12/4-12/13. MRI 12/7 suggestive of a widespread anoxic injury. PMH includes kidney stones and HTN.      SLP Plan  Continue with current plan of care       Recommendations  Diet recommendations: Dysphagia 2 (fine chop);Thin liquid Liquids provided via: Cup;Straw Medication Administration: Via alternative means Supervision: Staff to assist with self feeding;Full supervision/cueing for compensatory strategies Compensations: Minimize environmental distractions;Slow rate;Small sips/bites Postural Changes and/or Swallow Maneuvers: Seated upright 90 degrees                Oral Care Recommendations: Oral care BID Follow up Recommendations: Skilled Nursing facility SLP Visit Diagnosis: Dysphagia, oral phase (R13.11) Plan: Continue with current plan of care       GO                 Osie Bond., M.A. [place] [facility] Pager [phone] Office [phone]  08/29/2020, 2:52 PM

## 2020-08-29 NOTE — Progress Notes (Signed)
Nutrition Follow-up  DOCUMENTATION CODES:   Not applicable  INTERVENTION:   Continue tube feedings via PEG:  -Jevity 1.5 kcals @ goal of 65 mL/hr -PROSource 45 mL TF BID -200 mL free water flush q 3 hours.   -Tube feeding regimen provides2420kcal (100% of needs),122grams of protein, and 1146ml of H2O. Total free water: 2733ml/ daily  Monitor po intake, will adjust TF regimen as needed depending on po intake.     NUTRITION DIAGNOSIS:   Inadequate oral intake related to inability to eat as evidenced by NPO status.  Ongoing  GOAL:   Patient will meet greater than or equal to 90% of their needs  Ongoing   MONITOR:   Diet advancement,Labs,Weight trends,TF tolerance,Skin,I & O's  REASON FOR ASSESSMENT:   Consult,Ventilator Enteral/tube feeding initiation and management  ASSESSMENT:   Patient with PMH significant for HTN and kidney stones. Presents this admission s/p cardiac arrest.  12/13- NGT d/c 12/15- cortrak placed, tip of tube in the stomach 12/17- s/p BSE- recommend continue NPO 12/19- pt pulled cortrak tube, replaced- placement verified by x-ray (stomach) 12/20- s/p BSE- pt refusing PO trials 12/30- cortrak removed, PEG placed 1/5- Advanced to thin liquid diet 1/6- Advanced to full liquid diet 1/11- Advanced to dysphagia 2 diet   Pt examined at bedside. Pt unable to communicate during visit. Pt advanced to Dysphagia 2 diet today. Spoke with RN, unaware of any po intake. Per chart review, pt consumed 0-85% of documented meals. Per SLP, pt needs staff assistance with self feeding and cues to initiate swallowing the bolus.   Do not recommend decreasing tube feeding at this time as patient intake is likely still poor. Will reassess in a few days to determine if po intake is improved and about possible TF decrease.   Pt is medically safe for d/c but information for Medicaid is needed for SNF placement.   Labs reviewed: CBG's x 24 hours:  70-166  Medications reviewed and include: Coreg, Novolog SSI    Weight at admission: 225 lbs. Current weight: 189 lbs    Diet Order:   Diet Order            DIET DYS 2 Room service appropriate? No; Fluid consistency: Thin  Diet effective now                 EDUCATION NEEDS:   Not appropriate for education at this time  Skin:  Skin Assessment: Skin Integrity Issues: Skin Integrity Issues:: Other (Comment) Other: MASD to buttocks, skin tear to rt upper back  Last BM:  08/26/20  Height:   Ht Readings from Last 1 Encounters:  07/22/20 6' (1.829 m)    Weight:   Wt Readings from Last 1 Encounters:  08/29/20 85.9 kg    Ideal Body Weight:  80.9 kg  BMI:  Body mass index is 25.68 kg/m.  Estimated Nutritional Needs:   Kcal:  2300-2500 kcal  Protein:  115-130 grams  Fluid:  >/= 2 L/day    Ronnald Nian, Dietetic Intern Pager: [phone] If unavailable: [phone]

## 2020-08-29 NOTE — TOC Progression Note (Signed)
Transition of Care [facility]) - Progression Note    Patient Details  Name: Antonio Navarro MRN: [id number] Date of Birth: 01-18-1961  [facility]) CM/SW Contact  Curlene Labrum, RN Phone Number: 08/29/2020, 2:37 PM  Clinical Narrative:    Case management met at the bedside with the patient's sister, Adline Mango, and explained that financial counselor at [facility] will be reaching out to her and the patient's son, [name] for information regarding Medicaid application.  The patient's sister, [name] stated that the patient's son, [name], failed to turn in needed information to have the Medicaid application processed.  The patient's family is unable to provide 24 hour care at home and the patient will need [place] home placement at this point.  I explained to the sister, [name] that financial counseling will be reaching out to her to assist with needed information to process Medicaid to aid in assistance for SNF placement.  TOC will continue to follow the patient for [facility] placement.   Expected Discharge Plan: Skilled Nursing Facility Barriers to Discharge: Continued Medical Work up,No SNF bed  Expected Discharge Plan and Services Expected Discharge Plan: [place] arrangements for the past 2 months: Single Family Home                                       Social Determinants of Health (SDOH) Interventions    Readmission Risk Interventions No flowsheet data found.

## 2020-08-29 NOTE — Progress Notes (Signed)
CSW spoke with Medicaid staff at [place] [facility] who reports there is not a pending Medicaid application for this patient.  CSW will contact financial counseling to initiate that process.  Kena Limon, MSW, LCSW-A Transitions of Care  Clinical Social Worker I [phone]  

## 2020-08-29 NOTE — Plan of Care (Signed)
  Problem: Health Behavior/Discharge Planning: Goal: Ability to manage health-related needs will improve Outcome: Progressing   

## 2020-08-29 NOTE — Progress Notes (Signed)
[facility] PROGRESS NOTE  Antonio Navarro DDU:[id number] DOB: May 19, 1961 DOA: 07/22/2020 PCP: Patient, No Pcp Per  Status: Remains inpatient appropriate because:Altered mental status and Unsafe d/c plan   Dispo: The patient is from: Home              Anticipated d/c is to: SNF              Anticipated d/c date is: > 3 days              Patient currently is medically stable to d/c.  Barriers to discharge: Medicaid and disability pending.Family unable to provide adequate care 24/7 after dc so declined by CIR-focus is now on SNF   Code Status: Full Family Communication: 1/06 spoke with patient's sister [name] -/11 Sister also at bedside and was updated by case management staff DVT prophylaxis: sq heparin Vaccination status: Completed both doses of Pfizer Covid vaccination on 12/14/2019  Foley catheter: No  HPI: 60 year old male history of hypertension prescribed Norvasc prior to admission, chronic back pain, prior renal calculi who experienced an out of hospital cardiac arrest with downtime of 9 minutes.  Initial rhythm was ventricular fibrillation.  He was defibrillated deceived 6 doses of epi before ROSC.  Subsequent work-up revealed STEMI.  He underwent cardiac catheterization with a DES placed to the circumflex.  Cardiology following and has placed patient on Brilinta and aspirin and is also being treated with beta-blocker and statins.  From a neurological standpoint he clinically has anoxic brain injury.  During this hospitalization he experienced AKI and was treated with fluids and correction of hypoperfusion from cardiac arrest.  Echo performed this admission revealed ischemic cardiomyopathy with an EF of 40%  In addition to above patient has been unable to pass swallowing evaluations and subsequently has undergone percutaneous PEG tube placement on 12/30.  Patient is awake and moving all extremities spontaneously but does not follow commands and does not verbalize palliative care  has met with patient and family and has emphasized poor prognosis and will continue to meet with the family during the hospitalization.  Current plan is to transition to SNF bed once available.  Expect need for lifelong 24/7 care for all ADLs.  Subjective: Awakened.  Not started today.  Initially stated that he felt good.  Was unable to answer orientation questions appropriately.  Appeared to become rapidly fatigued.  Closed eyes and stopped answering questions  Objective: Vitals:   08/28/20 2015 08/29/20 0842  BP: (!) 151/103 (!) 111/91  Pulse: 84 77  Resp: 18 18  Temp: 98.3 F (36.8 C) 98 F (36.7 C)  SpO2: 100% 100%    Intake/Output Summary (Last 24 hours) at 08/29/2020 1101 Last data filed at 08/29/2020 0600 Gross per 24 hour  Intake 3310 ml  Output 950 ml  Net 2360 ml   Filed Weights   08/27/20 0600 08/28/20 0054 08/29/20 0842  Weight: 88.6 kg 89.1 kg 85.9 kg    Exam:  Constitutional: Alert, calm, no acute distress Respiratory: Stable on room air, anterior lung sounds clear Cardiovascular: Pulse is regular without tachycardia, normal heart sounds S1-S2, no peripheral edema Abdomen: Unremarkable, abdomen soft and nontender, normoactive bowel sounds, LBM 1/08 Neurologic: CN 2-12 roughly intact without any appreciable focal neurological deficits, moves all extremities x4 equally and strength is 4/5.  When fully awake patient does demonstrate purposeful movement Psychiatric: Awakens, stated he felt good today.  Was completely disoriented as to place and year but nodded in the affirmative to "is  your name Kailer".   Assessment/Plan: Acute problems: Out of hospital cardiac arrest secondary to STEMI -ROSC and subsequent catheterization revealed disease in circumflex requiring DES -Continue beta-blocker, statin; Brilinta which had been held briefly for PEG tube resumed on 12/30 by cardiology  Ischemic cardiomyopathy -Echo this admission EF 40% -Continue pharmacotherapy as  above -No ACE inhibitor secondary to underlying kidney disease -Continue hydralazine and Isordil -Remains euvolemic -Likely will need follow-up echo 3 months post initial echo  Significant anoxic brain injury secondary to cardiac arrest/deconditioning/gait instability -Has awakened, is participating with PT and OT.  SLP has cleared for full liquid -Awaiting confirmation from family that they can provide 24/7 care for several weeks after discharge from CIR -No hypertonicity or spasticity observed -Continue Seroquel 25 mg at HS for insomnia and brain injury sequela -Continue daytime Symmetrel to help contain daytime alertness in context of brain injury -PT session 1/10: Patient interested in participating in trying to stand but cognitively was struggling with a series of events that must occur in order to stand reliably.  Hypertension -Was on Norvasc prior to admission -Continue cardiovascular meds as listed above  Nonsustained VT -Continue amiodarone and beta-blocker -LFTs were repeated on 1/7 with stable AST but ALT has increased from 52 to 133 -we will need to repeat again in 1 to 2 weeks to ensure stability -Cardiology managing  Acute on chronic kidney disease stage IIIb -Suspect prerenal etiology in the setting of hypoperfusion from ventricular fibrillation arrest -Baseline creatinine between 1.6 and 2; creatinine on 12/19 was 1.67-will repeat in a.m. on 08/19/2020 -Continue free water per tube and intermittently follow labs  Dysphagia, moderate protein calorie malnutrition Nutrition Problem: Inadequate oral intake Etiology: inability to eat Signs/Symptoms: NPO status-as of 1/5 SLP has cleared for thin liquids-given difficulty with purposeful movement especially fine motor movement patient will likely need to be assisted with diet therefore order placed Interventions: Tube feeding,Prostat Estimated body mass index is 25.68 kg/m as calculated from the following:   Height as of this  encounter: 6' (1.829 m).   Weight as of this encounter: 85.9 kg.  Skin tear right lateral back Wound / Incision (Open or Dehisced) 07/28/20 Skin tear Back Lateral;Right;Upper (Active)  Date First Assessed/Time First Assessed: 07/28/20 0300   Wound Type: Skin tear  Location: Back  Location Orientation: Lateral;Right;Upper  Present on Admission: No    Assessments 07/28/2020  7:49 PM 08/28/2020  8:34 PM  Dressing Type None Foam - Lift dressing to assess site every shift  Dressing Changed - New  Dressing Status - Clean;Dry;Intact  Dressing Change Frequency - PRN  Site / Wound Assessment - Clean;Dry  Peri-wound Assessment - Intact  Wound Length (cm) - 0 cm  Wound Width (cm) - 0 cm  Wound Depth (cm) - 0 cm  Wound Volume (cm^3) - 0 cm^3  Wound Surface Area (cm^2) - 0 cm^2  Drainage Amount None None  Treatment Cleansed Other (Comment)     No Linked orders to display     Wound / Incision (Open or Dehisced) 08/04/20 (MASD) Moisture Associated Skin Damage Buttocks Right;Left (Active)  Date First Assessed/Time First Assessed: 08/04/20 0800   Wound Type: (MASD) Moisture Associated Skin Damage  Location: Buttocks  Location Orientation: Right;Left    Assessments 08/04/2020  8:16 AM 08/28/2020  8:34 PM  Dressing Type Foam - Lift dressing to assess site every shift Foam - Lift dressing to assess site every shift  Dressing Changed New New  Dressing Status Clean;Dry;Intact -  Dressing Change  Frequency PRN PRN  Site / Wound Assessment - Clean;Dry  Wound Length (cm) - 0 cm  Wound Width (cm) - 0 cm  Wound Depth (cm) - 0 cm  Wound Volume (cm^3) - 0 cm^3  Wound Surface Area (cm^2) - 0 cm^2  Drainage Amount - None  Treatment Cleansed Cleansed;Other (Comment)     No Linked orders to display    Other problems: Acute hypernatremia -Continue free water per tube  Hyperglycemia without an underlying diagnosis of diabetes mellitus -Likely related to acute stress -CBGs while on tube feedings  controlled with sliding scale insulin and high-dose Levemir insulin BID CBGs well-controlled at this juncture -Consider change to Glucerna tube feeding Levemir dosage can be decreased to minimize risk for unintended hypoglycemia -Hemoglobin A1c 4.7   Acute hypoxemic respiratory failure -Resolved -Stable on room air with O2 sats between 97 and 99%   Data Reviewed: Basic Metabolic Panel: Recent Labs  Lab 08/25/20 0303 08/26/20 0425  NA 137 140  K 4.1 4.2  CL 99 101  CO2 28 30  GLUCOSE 146* 146*  BUN 20 20  CREATININE 1.34* 1.30*  CALCIUM 9.5 9.8   Liver Function Tests: Recent Labs  Lab 08/25/20 0303  AST 58*  ALT 133*  ALKPHOS 85  BILITOT 0.7  PROT 7.0  ALBUMIN 3.2*   No results for input(s): LIPASE, AMYLASE in the last 168 hours. No results for input(s): AMMONIA in the last 168 hours. CBC: Recent Labs  Lab 08/25/20 0303 08/26/20 0425  WBC 7.3 7.2  HGB 10.9* 11.7*  HCT 34.6* 37.3*  MCV 106.1* 105.7*  PLT 162 160   Cardiac Enzymes: No results for input(s): CKTOTAL, CKMB, CKMBINDEX, TROPONINI in the last 168 hours. BNP (last 3 results) No results for input(s): BNP in the last 8760 hours.  ProBNP (last 3 results) No results for input(s): PROBNP in the last 8760 hours.  CBG: Recent Labs  Lab 08/28/20 1645 08/28/20 2009 08/29/20 0136 08/29/20 0413 08/29/20 0843  GLUCAP 142* 144* 161* 70 136*    No results found for this or any previous visit (from the past 240 hour(s)).   Studies: No results found.  Scheduled Meds: . amantadine  100 mg Per Tube Daily  . aspirin  81 mg Per Tube Daily  . atorvastatin  80 mg Per Tube Daily  . bethanechol  10 mg Per Tube TID  . carvedilol  25 mg Per Tube BID WC  . chlorhexidine gluconate (MEDLINE KIT)  15 mL Mouth Rinse BID  . feeding supplement (PROSource TF)  45 mL Per Tube BID  . free water  200 mL Per Tube Q3H  . heparin  5,000 Units Subcutaneous Q8H  . insulin aspart  0-9 Units Subcutaneous Q4H  . insulin  detemir  30 Units Subcutaneous BID  . isosorbide-hydrALAZINE  1 tablet Per Tube TID  . mouth rinse  15 mL Mouth Rinse BID  . polyvinyl alcohol  1 drop Both Eyes QID  . QUEtiapine  25 mg Per Tube QHS  . sodium chloride flush  3 mL Intravenous Q12H  . ticagrelor  90 mg Per Tube BID   Continuous Infusions: . sodium chloride 250 mL (08/17/20 0812)  . feeding supplement (JEVITY 1.5 CAL/FIBER) 1,000 mL (08/27/20 0736)    Active Problems:   Acute ST elevation myocardial infarction (STEMI) (HCC)   Cardiac arrest (HCC)   Elevated blood pressure reading with diagnosis of hypertension   Shock circulatory (HCC)   Encephalopathy   History of ETT  Cardiomyopathy, ischemic   Anoxic brain damage [facility])   Dysphagia   DNR (do not resuscitate) discussion   Gait instability   Consultants:  Cardiology  Interventional radiology  PCCM  Neurology  Procedures:  Cardiac catheterization 12/4  Continuous EEG 12/4 and overnight EEG with video 12/5  Echocardiogram 12/5  EEG 12/9  Percutaneous PEG tube placement 12/30  Antibiotics: Anti-infectives (From admission, onward)   Start     Dose/Rate Route Frequency Ordered Stop   08/17/20 0000  ceFAZolin (ANCEF) IVPB 2g/100 mL premix        2 g 200 mL/hr over 30 Minutes Intravenous To Radiology 08/15/20 1346 08/17/20 1111   07/22/20 0830  cefTRIAXone (ROCEPHIN) 2 g in sodium chloride 0.9 % 100 mL IVPB        2 g 200 mL/hr over 30 Minutes Intravenous Every 24 hours 07/22/20 0720 07/26/20 0858       Time spent: 20 minutes    Erin Hearing ANP  [facility] 7 am - 330 pm/M-F for direct patient care and secure chat Please refer to Amion for contact information 38 days

## 2020-08-30 LAB — GLUCOSE, CAPILLARY
Glucose-Capillary: 108 mg/dL — ABNORMAL HIGH (ref 70–99)
Glucose-Capillary: 120 mg/dL — ABNORMAL HIGH (ref 70–99)
Glucose-Capillary: 129 mg/dL — ABNORMAL HIGH (ref 70–99)
Glucose-Capillary: 133 mg/dL — ABNORMAL HIGH (ref 70–99)
Glucose-Capillary: 146 mg/dL — ABNORMAL HIGH (ref 70–99)
Glucose-Capillary: 148 mg/dL — ABNORMAL HIGH (ref 70–99)

## 2020-08-30 NOTE — TOC Progression Note (Signed)
Transition of Care [facility] - Progression Note    Patient Details  Name: Antonio Navarro MRN: [id number] Date of Birth: 1961-06-03  Transition of Care [facility]) CM/SW Contact  Curlene Labrum, RN Phone Number: 08/30/2020, 2:50 PM  Clinical Narrative:    Case management called and left a message with Gennette Pac, son and Sol Passer, sister regarding the patients needed Medicaid paperwork.  Danne Baxter, RNCM with CIR spoke with the patient fiance, Maudie Mercury, and she states that [name], the patient's son, emailed paperwork back regarding Medicaid and disability to assist in the patient obtaining Medicaid.  Case management will continue to follow the patient for SNF placement and pending Medicaid and disability.   Expected Discharge Plan: Skilled Nursing Facility Barriers to Discharge: Continued Medical Work up,No SNF bed  Expected Discharge Plan and Services Expected Discharge Plan: [facility] arrangements for the past 2 months: Single Family Home                                       Social Determinants of Health (SDOH) Interventions    Readmission Risk Interventions No flowsheet data found.

## 2020-08-30 NOTE — Progress Notes (Signed)
Occupational Therapy Treatment Patient Details Name: Antonio Navarro MRN: [id number] DOB: 1961/08/04 Today's Date: 08/30/2020    History of present illness [name] with history of tobacco abuse, obesity, HTN who was admitted 07/22/20 post cardiac arrest with inferolateral STEMI. He was resuscitated in the field by EMS after being found to be in V fib. Approximately 30 minutes of CPR. He was intubated on arrival.  Pt with anoxic brain injury. PEG placed on 08/17/20   OT comments  Patient continues to progress with stated goals.  He is continuing to follow commands better.  Short, direct cues with tactile follow up as needed.  Patient will initiate functional tasks when directed, but continues to terminate that tasks unexpectedly, and not complete the task.  Able to mobilize the patient out into the halls this date with +2 min HHA.  CIR continues to be recommended, but acute OT will continue to follow while he is admitted.     Follow Up Recommendations  CIR    Equipment Recommendations  Wheelchair (measurements OT);Wheelchair cushion (measurements OT);Hospital bed;3 in 1 bedside commode    Recommendations for Other Services      Precautions / Restrictions Precautions Precautions: Fall Precaution Comments: mitts, gtube Restrictions Weight Bearing Restrictions: No       Mobility Bed Mobility Overal bed mobility: Needs Assistance Bed Mobility: Supine to Sit     Supine to sit: Min assist;Mod assist     General bed mobility comments: assist for initiation and modA to lift trunk  Transfers Overall transfer level: Needs assistance Equipment used: 2 person hand held assist Transfers: Sit to/from Stand Sit to Stand: +2 physical assistance;Min assist Stand pivot transfers: Min assist;+2 physical assistance       General transfer comment: use of momentum to assist    Balance Overall balance assessment: Needs assistance Sitting-balance support: Bilateral upper extremity  supported;Feet supported Sitting balance-Leahy Scale: Fair Sitting balance - Comments: close supervision   Standing balance support: Bilateral upper extremity supported;During functional activity Standing balance-Leahy Scale: Poor Standing balance comment: requiring minA+2 during ambulation with HHAx2                           ADL either performed or assessed with clinical judgement   ADL   Eating/Feeding: NPO   Grooming: Wash/dry hands;Wash/dry face;Maximal assistance;Sitting Grooming Details (indicate cue type and reason): pt requried total hand over hand assist to complete task for thoroughness, pt initially initating task with tactile cues but then terminates task unexpectedly                             Functional mobility during ADLs: +2 for physical assistance;Minimal assistance;Moderate assistance                         Cognition Arousal/Alertness: Awake/alert Behavior During Therapy: Flat affect Overall Cognitive Status: Impaired/Different from baseline Area of Impairment: Problem solving;Awareness;Safety/judgement;Following commands;Memory;Attention;Orientation               Rancho Levels of Cognitive Functioning Rancho Los Amigos Scales of Cognitive Functioning: Confused/inappropriate/non-agitated Orientation Level: Disoriented to;Place;Time;Situation Current Attention Level: Selective Memory: Decreased recall of precautions;Decreased short-term memory Following Commands: Follows one step commands inconsistently Safety/Judgement: Decreased awareness of safety;Decreased awareness of deficits Awareness: Intellectual Problem Solving: Slow processing;Difficulty sequencing;Requires verbal cues;Requires tactile cues General Comments: delayed response to cues and directions  Exercises     Shoulder Instructions       General Comments      Pertinent Vitals/ Pain       Pain Assessment: Faces Faces Pain Scale: No  hurt Pain Intervention(s): Monitored during session                                                          Frequency  Min 2X/week        Progress Toward Goals  OT Goals(current goals can now be found in the care plan section)  Progress towards OT goals: Progressing toward goals  Acute Rehab OT Goals Patient Stated Goal: family asking to get stronger OT Goal Formulation: With family Time For Goal Achievement: 09/13/20 Potential to Achieve Goals: Fair ADL Goals Pt Will Perform Grooming: with min assist Pt Will Perform Upper Body Bathing: with min assist;sitting Pt Will Perform Upper Body Dressing: with min guard assist Pt Will Transfer to Toilet: with min assist;ambulating;regular height toilet  Plan Discharge plan remains appropriate;Frequency remains appropriate    Co-evaluation    PT/OT/SLP Co-Evaluation/Treatment: Yes Reason for Co-Treatment: Complexity of the patient's impairments (multi-system involvement);For patient/therapist safety PT goals addressed during session: Mobility/safety with mobility;Balance OT goals addressed during session: ADL's and self-care      AM-PAC OT "6 Clicks" Daily Activity     Outcome Measure   Help from another person eating meals?: A Lot Help from another person taking care of personal grooming?: A Lot Help from another person toileting, which includes using toliet, bedpan, or urinal?: Total Help from another person bathing (including washing, rinsing, drying)?: A Lot Help from another person to put on and taking off regular upper body clothing?: A Lot Help from another person to put on and taking off regular lower body clothing?: Total 6 Click Score: 10    End of Session Equipment Utilized During Treatment: Gait belt  OT Visit Diagnosis: Muscle weakness (generalized) (M62.81);Pain;Other abnormalities of gait and mobility (R26.89);Cognitive communication deficit (R41.841);Low vision, both eyes (H54.2)    Activity Tolerance Patient tolerated treatment well   Patient Left in chair;with call bell/phone within reach;with chair alarm set;Other (comment)   Nurse Communication Mobility status        Time: [phone] OT Time Calculation (min): 25 min  Charges: OT General Charges $OT Visit: 1 Visit OT Treatments $Self Care/Home Management : 8-22 mins  08/30/2020  Rich, OTR/L  Acute Rehabilitation Services  Office:  [place] 08/30/2020, 10:44 AM

## 2020-08-30 NOTE — Progress Notes (Signed)
Physical Therapy Treatment Patient Details Name: Antonio Navarro MRN: [id number] DOB: [birth year] Today's Date: 08/30/2020    History of Present Illness [name] with history of tobacco abuse, obesity, HTN who was admitted 07/22/20 post cardiac arrest with inferolateral STEMI. He was resuscitated in the field by EMS after being found to be in V fib. Approximately 30 minutes of CPR. He was intubated on arrival.  Pt with anoxic brain injury. PEG placed on 08/17/20    PT Comments    Patient is progressing towards physical therapy goals. Patient ambulated 150' with HHAx2 and modA+2 for safety due to impulsivity and poor balance. Patient following one step commands with increased time and assist to initiate. Patient continues to present with decreased activity tolerance, impaired balance, impaired cognition, generalized weakness, and impaired functional mobility. Continue to recommend SNF for ongoing Physical Therapy.       Follow Up Recommendations  SNF     Equipment Recommendations   (defer to post acute rehab)    Recommendations for Other Services       Precautions / Restrictions Precautions Precautions: Fall Precaution Comments: mitts, gtube Restrictions Weight Bearing Restrictions: No    Mobility  Bed Mobility Overal bed mobility: Needs Assistance Bed Mobility: Supine to Sit;Sit to Supine     Supine to sit: Mod assist     General bed mobility comments: assist for initiation and modA to lift trunk  Transfers Overall transfer level: Needs assistance Equipment used: None Transfers: Sit to/from Stand Sit to Stand: Min assist;+2 physical assistance;+2 safety/equipment         General transfer comment: use of momentum to assist  Ambulation/Gait Ambulation/Gait assistance: +2 safety/equipment;Mod assist Gait Distance (Feet): 150 Feet Assistive device: 2 person hand held assist Gait Pattern/deviations: Step-through pattern;Decreased stride length;Ataxic      General Gait Details: poor balance and control with ambulation; modA+2 for balance with HHAx2 and cues for direction. Attempted wayfinding to return to room, patient unable to follow commands and verbalize number   Stairs             Wheelchair Mobility    Modified Rankin (Stroke Patients Only)       Balance Overall balance assessment: Needs assistance Sitting-balance support: Bilateral upper extremity supported;Feet supported Sitting balance-Leahy Scale: Fair Sitting balance - Comments: close supervision   Standing balance support: Bilateral upper extremity supported;During functional activity Standing balance-Leahy Scale: Poor Standing balance comment: requiring minA+2 during ambulation with HHAx2                            Cognition Arousal/Alertness: Awake/alert Behavior During Therapy: Flat affect Overall Cognitive Status: Impaired/Different from baseline Area of Impairment: Problem solving;Awareness;Safety/judgement;Following commands;Memory;Attention;Orientation               Rancho Levels of Cognitive Functioning Rancho Los Amigos Scales of Cognitive Functioning: Confused/inappropriate/non-agitated Orientation Level: Disoriented to;Place;Time;Situation Current Attention Level: Selective Memory: Decreased recall of precautions;Decreased short-term memory Following Commands: Follows one step commands inconsistently Safety/Judgement: Decreased awareness of safety;Decreased awareness of deficits Awareness: Intellectual Problem Solving: Slow processing;Difficulty sequencing;Requires verbal cues;Requires tactile cues General Comments: delayed response to cues and directions      Exercises      General Comments        Pertinent Vitals/Pain Pain Assessment: Faces Faces Pain Scale: No hurt    Home Living  Prior Function            PT Goals (current goals can now be found in the care plan section) Acute  Rehab PT Goals Patient Stated Goal: family asking to get stronger PT Goal Formulation: With patient Time For Goal Achievement: 09/08/20 Potential to Achieve Goals: Fair Progress towards PT goals: Progressing toward goals    Frequency    Min 3X/week      PT Plan Current plan remains appropriate    Co-evaluation PT/OT/SLP Co-Evaluation/Treatment: Yes Reason for Co-Treatment: For patient/therapist safety;Necessary to address cognition/behavior during functional activity PT goals addressed during session: Mobility/safety with mobility;Balance        AM-PAC PT "6 Clicks" Mobility   Outcome Measure  Help needed turning from your back to your side while in a flat bed without using bedrails?: A Little Help needed moving from lying on your back to sitting on the side of a flat bed without using bedrails?: A Little Help needed moving to and from a bed to a chair (including a wheelchair)?: A Lot Help needed standing up from a chair using your arms (e.g., wheelchair or bedside chair)?: A Little Help needed to walk in hospital room?: A Lot Help needed climbing 3-5 steps with a railing? : A Lot 6 Click Score: 15    End of Session Equipment Utilized During Treatment: Gait belt Activity Tolerance: Patient tolerated treatment well Patient left: in chair;with call bell/phone within reach;with chair alarm set;with restraints reapplied Nurse Communication: Mobility status PT Visit Diagnosis: Other abnormalities of gait and mobility (R26.89)     Time: 7106-2694 PT Time Calculation (min) (ACUTE ONLY): 23 min  Charges:  $Therapeutic Activity: 8-22 mins                     Andersen Iorio A. Gilford Rile PT, DPT Acute Rehabilitation Services Pager [phone] Office [phone]    Alda Lea 08/30/2020, 10:29 AM

## 2020-08-30 NOTE — Progress Notes (Signed)
[facility] PROGRESS NOTE  CASSON CATENA DUK:[id number] DOB: August 04, 1961 DOA: 07/22/2020 PCP: Patient, No Pcp Per  Status: Remains inpatient appropriate because:Altered mental status and Unsafe d/c plan   Dispo: The patient is from: Home              Anticipated d/c is to: SNF              Anticipated d/c date is: > 3 days              Patient currently is medically stable to d/c.  Barriers to discharge: Medicaid and disability pending.Family unable to provide adequate care 24/7 after dc so declined by CIR-focus is now on SNF   Code Status: Full Family Communication: 1/06 spoke with patient's sister [name] -/11 Sister also at bedside and was updated by case management staff DVT prophylaxis: sq heparin Vaccination status: Completed both doses of Pfizer Covid vaccination on 12/14/2019  Foley catheter: No  HPI: 60 year old male history of hypertension prescribed Norvasc prior to admission, chronic back pain, prior renal calculi who experienced an out of hospital cardiac arrest with downtime of 9 minutes.  Initial rhythm was ventricular fibrillation.  He was defibrillated deceived 6 doses of epi before ROSC.  Subsequent work-up revealed STEMI.  He underwent cardiac catheterization with a DES placed to the circumflex.  Cardiology following and has placed patient on Brilinta and aspirin and is also being treated with beta-blocker and statins.  From a neurological standpoint he clinically has anoxic brain injury.  During this hospitalization he experienced AKI and was treated with fluids and correction of hypoperfusion from cardiac arrest.  Echo performed this admission revealed ischemic cardiomyopathy with an EF of 40%  In addition to above patient has been unable to pass swallowing evaluations and subsequently has undergone percutaneous PEG tube placement on 12/30.  Patient is awake and moving all extremities spontaneously but does not follow commands and does not verbalize palliative care  has met with patient and family and has emphasized poor prognosis and will continue to meet with the family during the hospitalization.  Current plan is to transition to SNF bed once available.  Expect need for lifelong 24/7 care for all ADLs.  Subjective: Awake. No specific complaints reported remains confused. After my initial evaluation returned to the floor to see another patient and patient was up walking with physical therapy with 2+ assist  Objective: Vitals:   08/30/20 0428 08/30/20 1218  BP: (!) 128/95 101/88  Pulse: 85 80  Resp: 18 20  Temp: 98.5 F (36.9 C) (!) 97.4 F (36.3 C)  SpO2: 100% 100%    Intake/Output Summary (Last 24 hours) at 08/30/2020 1237 Last data filed at 08/30/2020 0654 Gross per 24 hour  Intake 685 ml  Output 1475 ml  Net -790 ml   Filed Weights   08/27/20 0600 08/28/20 0054 08/29/20 0842  Weight: 88.6 kg 89.1 kg 85.9 kg    Exam:  Constitutional: Alert, calm, no acute distress Respiratory: Stable on room air, anterior lung sounds clear Cardiovascular: Pulse is regular without tachycardia, normal heart sounds S1-S2, no peripheral edema Abdomen: Unremarkable, abdomen soft and nontender, normoactive bowel sounds, LBM 1/08 Neurologic: CN 2-12 roughly intact without any appreciable focal neurological deficits, moves all extremities x4 equally and strength is 4/5.  When fully awake patient does demonstrate purposeful movement Psychiatric: Awake, oriented to name only. Flat affect   Assessment/Plan: Acute problems: Out of hospital cardiac arrest secondary to STEMI -ROSC and subsequent catheterization revealed  disease in circumflex requiring DES -Continue beta-blocker, statin; Brilinta which had been held briefly for PEG tube resumed on 12/30 by cardiology  Ischemic cardiomyopathy -Echo this admission EF 40% -Continue pharmacotherapy as above -No ACE inhibitor secondary to underlying kidney disease -Continue hydralazine and Isordil -Remains  euvolemic -Likely will need follow-up echo 3 months post initial echo  Significant anoxic brain injury secondary to cardiac arrest/deconditioning/gait instability -Has awakened, is participating with PT and OT.  SLP has cleared for dysphagia 2 with thin liquids -Awaiting confirmation from family that they can provide 24/7 care for several weeks after discharge from CIR -PT will make attempts to increase frequency of therapy including possible daily therapy alternating between PT and OT -No hypertonicity or spasticity observed -Continue Seroquel 25 mg at HS for insomnia and brain injury sequela -Continue daytime Symmetrel to help contain daytime alertness in context of brain injury -PT session 1/10: Patient interested in participating in trying to stand but cognitively was struggling with a series of events that must occur in order to stand reliably.  Hypertension -Was on Norvasc prior to admission -Continue cardiovascular meds as listed above  Nonsustained VT -Continue amiodarone and beta-blocker -LFTs were repeated on 1/7 with stable AST but ALT has increased from 52 to 133 -we will need to repeat again in 1 to 2 weeks to ensure stability -Cardiology managing  Acute on chronic kidney disease stage IIIb -Suspect prerenal etiology in the setting of hypoperfusion from ventricular fibrillation arrest -Baseline creatinine between 1.6 and 2; creatinine on 12/19 was 1.67-will repeat in a.m. on 08/19/2020 -Continue free water per tube and intermittently follow labs  Dysphagia, moderate protein calorie malnutrition Nutrition Problem: Inadequate oral intake Etiology: inability to eat Signs/Symptoms: NPO status-as of 1/5 SLP has cleared for thin liquids-given difficulty with purposeful movement especially fine motor movement patient will likely need to be assisted with diet therefore order placed Interventions: Tube feeding,Prostat Estimated body mass index is 25.68 kg/m as calculated from the  following:   Height as of this encounter: 6' (1.829 m).   Weight as of this encounter: 85.9 kg.  Skin tear right lateral back Wound / Incision (Open or Dehisced) 07/28/20 Skin tear Back Lateral;Right;Upper (Active)  Date First Assessed/Time First Assessed: 07/28/20 0300   Wound Type: Skin tear  Location: Back  Location Orientation: Lateral;Right;Upper  Present on Admission: No    Assessments 07/28/2020  7:49 PM 08/29/2020  8:38 PM  Dressing Type None Foam - Lift dressing to assess site every shift  Dressing Status - Clean;Dry;Intact  Dressing Change Frequency - PRN  Site / Wound Assessment - Clean;Dry  Peri-wound Assessment - Intact  Wound Length (cm) - 0 cm  Wound Width (cm) - 0 cm  Wound Depth (cm) - 0 cm  Wound Volume (cm^3) - 0 cm^3  Wound Surface Area (cm^2) - 0 cm^2  Drainage Amount None None  Treatment Cleansed -     No Linked orders to display     Wound / Incision (Open or Dehisced) 08/04/20 (MASD) Moisture Associated Skin Damage Buttocks Right;Left (Active)  Date First Assessed/Time First Assessed: 08/04/20 0800   Wound Type: (MASD) Moisture Associated Skin Damage  Location: Buttocks  Location Orientation: Right;Left    Assessments 08/04/2020  8:16 AM 08/30/2020  8:11 AM  Dressing Type Foam - Lift dressing to assess site every shift Foam - Lift dressing to assess site every shift  Dressing Changed New -  Dressing Status Clean;Dry;Intact -  Dressing Change Frequency PRN PRN  Site /  Wound Assessment - Dry;Clean  Treatment Cleansed -     No Linked orders to display    Other problems: Acute hypernatremia -Continue free water per tube  Hyperglycemia without an underlying diagnosis of diabetes mellitus -Likely related to acute stress -CBGs while on tube feedings controlled with sliding scale insulin and high-dose Levemir insulin BID CBGs well-controlled at this juncture -Consider change to Glucerna tube feeding Levemir dosage can be decreased to minimize risk for  unintended hypoglycemia -Hemoglobin A1c 4.7   Acute hypoxemic respiratory failure -Resolved -Stable on room air with O2 sats between 97 and 99%   Data Reviewed: Basic Metabolic Panel: Recent Labs  Lab 08/25/20 0303 08/26/20 0425  NA 137 140  K 4.1 4.2  CL 99 101  CO2 28 30  GLUCOSE 146* 146*  BUN 20 20  CREATININE 1.34* 1.30*  CALCIUM 9.5 9.8   Liver Function Tests: Recent Labs  Lab 08/25/20 0303  AST 58*  ALT 133*  ALKPHOS 85  BILITOT 0.7  PROT 7.0  ALBUMIN 3.2*   No results for input(s): LIPASE, AMYLASE in the last 168 hours. No results for input(s): AMMONIA in the last 168 hours. CBC: Recent Labs  Lab 08/25/20 0303 08/26/20 0425  WBC 7.3 7.2  HGB 10.9* 11.7*  HCT 34.6* 37.3*  MCV 106.1* 105.7*  PLT 162 160   Cardiac Enzymes: No results for input(s): CKTOTAL, CKMB, CKMBINDEX, TROPONINI in the last 168 hours. BNP (last 3 results) No results for input(s): BNP in the last 8760 hours.  ProBNP (last 3 results) No results for input(s): PROBNP in the last 8760 hours.  CBG: Recent Labs  Lab 08/29/20 2025 08/30/20 0020 08/30/20 0427 08/30/20 0724 08/30/20 1216  GLUCAP 125* 146* 108* 133* 120*    No results found for this or any previous visit (from the past 240 hour(s)).   Studies: No results found.  Scheduled Meds: . amantadine  100 mg Per Tube Daily  . aspirin  81 mg Per Tube Daily  . atorvastatin  80 mg Per Tube Daily  . bethanechol  10 mg Per Tube TID  . carvedilol  25 mg Per Tube BID WC  . chlorhexidine gluconate (MEDLINE KIT)  15 mL Mouth Rinse BID  . feeding supplement (PROSource TF)  45 mL Per Tube BID  . free water  200 mL Per Tube Q3H  . heparin  5,000 Units Subcutaneous Q8H  . insulin aspart  0-9 Units Subcutaneous Q4H  . insulin detemir  30 Units Subcutaneous BID  . isosorbide-hydrALAZINE  1 tablet Per Tube TID  . mouth rinse  15 mL Mouth Rinse BID  . polyvinyl alcohol  1 drop Both Eyes QID  . QUEtiapine  25 mg Per Tube QHS   . sodium chloride flush  3 mL Intravenous Q12H  . ticagrelor  90 mg Per Tube BID   Continuous Infusions: . sodium chloride 250 mL (08/17/20 0812)  . feeding supplement (JEVITY 1.5 CAL/FIBER) 1,000 mL (08/27/20 0736)    Active Problems:   Acute ST elevation myocardial infarction (STEMI) (HCC)   Cardiac arrest (HCC)   Elevated blood pressure reading with diagnosis of hypertension   Shock circulatory (HCC)   Encephalopathy   History of ETT   Cardiomyopathy, ischemic   Anoxic brain damage [facility])   Dysphagia   DNR (do not resuscitate) discussion   Gait instability   Consultants:  Cardiology  Interventional radiology  PCCM  Neurology  Procedures:  Cardiac catheterization 12/4  Continuous EEG 12/4 and overnight  EEG with video 12/5  Echocardiogram 12/5  EEG 12/9  Percutaneous PEG tube placement 12/30  Antibiotics: Anti-infectives (From admission, onward)   Start     Dose/Rate Route Frequency Ordered Stop   08/17/20 0000  ceFAZolin (ANCEF) IVPB 2g/100 mL premix        2 g 200 mL/hr over 30 Minutes Intravenous To Radiology 08/15/20 1346 08/17/20 1111   07/22/20 0830  cefTRIAXone (ROCEPHIN) 2 g in sodium chloride 0.9 % 100 mL IVPB        2 g 200 mL/hr over 30 Minutes Intravenous Every 24 hours 07/22/20 0720 07/26/20 0858       Time spent: 20 minutes    Erin Hearing ANP  [facility] 7 am - 330 pm/M-F for direct patient care and secure chat Please refer to Amion for contact information 39 days

## 2020-08-31 LAB — GLUCOSE, CAPILLARY
Glucose-Capillary: 116 mg/dL — ABNORMAL HIGH (ref 70–99)
Glucose-Capillary: 122 mg/dL — ABNORMAL HIGH (ref 70–99)
Glucose-Capillary: 149 mg/dL — ABNORMAL HIGH (ref 70–99)
Glucose-Capillary: 153 mg/dL — ABNORMAL HIGH (ref 70–99)
Glucose-Capillary: 153 mg/dL — ABNORMAL HIGH (ref 70–99)
Glucose-Capillary: 163 mg/dL — ABNORMAL HIGH (ref 70–99)

## 2020-08-31 NOTE — Progress Notes (Signed)
Physical Therapy Treatment Patient Details Name: Antonio Navarro MRN: [id number] DOB: Oct 01, 1960 Today's Date: 08/31/2020    History of Present Illness [name] with history of tobacco abuse, obesity, HTN who was admitted 07/22/20 post cardiac arrest with inferolateral STEMI. He was resuscitated in the field by EMS after being found to be in V fib. Approximately 30 minutes of CPR. He was intubated on arrival.  Pt with anoxic brain injury. PEG placed on 08/17/20    PT Comments    Pt with limited participation due to fatigue. Pt agreeable to get cleaned up due to being soiled upon arrival. Pt requiring mod A of 1 for sit<>stand and to side step up to [facility]. Pt continues to have limited command following and inconsistently able to perform Pt continues to demonstrate deficits in balance, coordination, gait, safety and endurance and will benefit from skilled PT to address deficits to maximize independence with functional mobility prior to discharge.     Follow Up Recommendations  SNF     Equipment Recommendations  None recommended by PT    Recommendations for Other Services       Precautions / Restrictions Precautions Precautions: Fall Precaution Comments: mitts, gtube Restrictions Weight Bearing Restrictions: No    Mobility  Bed Mobility Overal bed mobility: Needs Assistance Bed Mobility: Supine to Sit;Sit to Supine     Supine to sit: Min assist Sit to supine: Mod assist      Transfers Overall transfer level: Needs assistance Equipment used: 1 person hand held assist Transfers: Sit to/from Stand Sit to Stand: Mod assist         General transfer comment: performed sit<>stand multiple times EOB for hygiene and changing of linens. Pt becoming fatigued and attempted to return to bed during activity  Ambulation/Gait                 Stairs             Wheelchair Mobility    Modified Rankin (Stroke Patients Only)       Balance Overall balance  assessment: Needs assistance Sitting-balance support: No upper extremity supported;Feet unsupported Sitting balance-Leahy Scale: Fair Sitting balance - Comments: close supervision   Standing balance support: Single extremity supported Standing balance-Leahy Scale: Poor Standing balance comment: requiring min-mod A to maintain balance                            Cognition Arousal/Alertness: Awake/alert Behavior During Therapy: Flat affect Overall Cognitive Status: Impaired/Different from baseline Area of Impairment: Problem solving;Awareness;Safety/judgement;Following commands;Memory;Attention;Orientation               Rancho Levels of Cognitive Functioning Rancho Los Amigos Scales of Cognitive Functioning: Confused/inappropriate/non-agitated Orientation Level: Disoriented to;Place;Time;Situation Current Attention Level: Selective Memory: Decreased recall of precautions;Decreased short-term memory Following Commands: Follows one step commands inconsistently Safety/Judgement: Decreased awareness of safety;Decreased awareness of deficits Awareness: Intellectual Problem Solving: Slow processing;Difficulty sequencing;Requires verbal cues;Requires tactile cues General Comments: delayed response to cues and directions, verbal cueing for LE movement, inconsistently able to follow      Exercises      General Comments        Pertinent Vitals/Pain Pain Location: pt states some pain and pointed to LE but unable to verbalize number or exact location, no grimacing or discomfort noted during movement    Home Living  Prior Function            PT Goals (current goals can now be found in the care plan section) Acute Rehab PT Goals Patient Stated Goal: family asking to get stronger PT Goal Formulation: With patient Time For Goal Achievement: 09/08/20 Potential to Achieve Goals: Fair Progress towards PT goals: Progressing toward goals     Frequency    Min 3X/week      PT Plan Current plan remains appropriate    Co-evaluation              AM-PAC PT "6 Clicks" Mobility   Outcome Measure  Help needed turning from your back to your side while in a flat bed without using bedrails?: A Little Help needed moving from lying on your back to sitting on the side of a flat bed without using bedrails?: A Lot Help needed moving to and from a bed to a chair (including a wheelchair)?: A Lot Help needed standing up from a chair using your arms (e.g., wheelchair or bedside chair)?: A Lot Help needed to walk in hospital room?: A Lot Help needed climbing 3-5 steps with a railing? : A Lot 6 Click Score: 13    End of Session Equipment Utilized During Treatment: Gait belt Activity Tolerance: Patient limited by fatigue Patient left: in chair;with call bell/phone within reach;with chair alarm set;with restraints reapplied Nurse Communication: Mobility status PT Visit Diagnosis: Other abnormalities of gait and mobility (R26.89)     Time: 3382-5053 PT Time Calculation (min) (ACUTE ONLY): 16 min  Charges:  $Therapeutic Activity: 8-22 mins                     Lyanne Co, DPT Acute Rehabilitation Services [phone]   [place] 08/31/2020, 2:28 PM

## 2020-08-31 NOTE — Plan of Care (Signed)
  Problem: Health Behavior/Discharge Planning: Goal: Ability to manage health-related needs will improve Outcome: Progressing   Problem: Clinical Measurements: Goal: Ability to maintain clinical measurements within normal limits will improve Outcome: Progressing   Problem: Activity: Goal: Risk for activity intolerance will decrease Outcome: Progressing   Problem: Nutrition: Goal: Adequate nutrition will be maintained Outcome: Progressing   

## 2020-08-31 NOTE — Progress Notes (Signed)
[facility] PROGRESS NOTE  Antonio Navarro WCB:[id number] DOB: 1960-12-26 DOA: 07/22/2020 PCP: Antonio Navarro, No Pcp Per  Status: Remains inpatient appropriate because:Altered mental status and Unsafe d/c plan   Dispo: The Antonio Navarro is from: Home              Anticipated d/c is to: SNF              Anticipated d/c date is: > 3 days              Antonio Navarro currently is medically stable to d/c.  Barriers to discharge: Medicaid and disability pending.Family unable to provide adequate care 24/7 after dc so declined by CIR-focus is now on SNF   Code Status: Full Family Communication: 1/06 spoke with Antonio Navarro's sister Antonio Navarro -/11 Sister also at bedside and was updated by case management staff DVT prophylaxis: sq heparin Vaccination status: Completed both doses of Pfizer Covid vaccination on 12/14/2019  Foley catheter: No  HPI: 60 year old male history of hypertension prescribed Norvasc prior to admission, chronic back pain, prior renal calculi who experienced an out of hospital cardiac arrest with downtime of 9 minutes.  Initial rhythm was ventricular fibrillation.  He was defibrillated deceived 6 doses of epi before ROSC.  Subsequent work-up revealed STEMI.  He underwent cardiac catheterization with a DES placed to the circumflex.  Cardiology following and has placed Antonio Navarro on Brilinta and aspirin and is also being treated with beta-blocker and statins.  From a neurological standpoint he clinically has anoxic brain injury.  During this hospitalization he experienced AKI and was treated with fluids and correction of hypoperfusion from cardiac arrest.  Echo performed this admission revealed ischemic cardiomyopathy with an EF of 40%  In addition to above Antonio Navarro has been unable to pass swallowing evaluations and subsequently has undergone percutaneous PEG tube placement on 12/30.  Antonio Navarro is awake and moving all extremities spontaneously but does not follow commands and does not verbalize palliative care  has met with Antonio Navarro and family and has emphasized poor prognosis and will continue to meet with the family during the hospitalization.  Current plan is to transition to SNF bed once available.  Expect need for lifelong 24/7 care for all ADLs.  Subjective: Awake.  Alert and smiling.  Continues to remain disoriented regarding name and year even when given prompting cues.  Able to clearly state his first name.  No complaints expressed by Antonio Navarro.  Objective: Vitals:   08/31/20 0425 08/31/20 0839  BP: (!) 122/96 (!) 142/115  Pulse: 86 79  Resp: 20 (!) 22  Temp: 98.7 F (37.1 C)   SpO2: 95% 100%    Intake/Output Summary (Last 24 hours) at 08/31/2020 1129 Last data filed at 08/31/2020 0900 Gross per 24 hour  Intake 5530 ml  Output 2200 ml  Net 3330 ml   Filed Weights   08/28/20 0054 08/29/20 0842 08/31/20 0132  Weight: 89.1 kg 85.9 kg 88 kg    Exam:  Constitutional: Alert, no acute distress Respiratory: Bilateral lung sounds are clear to auscultation, stable on room air Cardiovascular: Heart sounds are normal S1-S2, regular pulse, extremities warm to touch with adequate capillary refill Abdomen:soft and nontender, normoactive bowel sounds, LBM 1/12 Neurologic: CN 2-12 roughly without any appreciable focal neurological deficits, MOE x4, strength is 4/5.   Psychiatric: Awake, oriented to name only. Flat affect   Assessment/Plan: Acute problems: Out of hospital cardiac arrest secondary to STEMI -ROSC and subsequent catheterization revealed disease in circumflex requiring DES -Continue beta-blocker, statin; Brilinta  which had been held briefly for PEG tube resumed on 12/30 by cardiology -Denies chest pain  Ischemic cardiomyopathy -Echo this admission EF 40% -Continue pharmacotherapy as above -No ACE inhibitor secondary to underlying kidney disease -Continue hydralazine and Isordil -Remains euvolemic -Likely will need follow-up echo 3 months post initial echo  Significant  anoxic brain injury secondary to cardiac arrest/deconditioning/gait instability -Consistently participating with PT and OT.  SLP has cleared for dysphagia 2 with thin liquids -Awaiting confirmation from family that they can provide 24/7 care for several weeks after discharge from CIR -PT will make attempts to increase frequency of therapy including possible daily therapy alternating between PT and OT -No hypertonicity or spasticity observed -Continue Seroquel 25 mg at HS for insomnia and brain injury sequela -Continue daytime Symmetrel to help contain daytime alertness in context of brain injury -PT session 1/10: Antonio Navarro interested in participating in trying to stand but cognitively was struggling with a series of events that must occur in order to stand reliably.  Hypertension -Was on Norvasc prior to admission -Continue cardiovascular meds as listed above  Nonsustained VT -Continue amiodarone and beta-blocker -LFTs were repeated on 1/7 with stable AST but ALT has increased from 52 to 133 -we will need to repeat again in 1 to 2 weeks to ensure stability -Cardiology managing  Acute on chronic kidney disease stage IIIb -Suspect prerenal etiology in the setting of hypoperfusion from ventricular fibrillation arrest -Baseline creatinine between 1.6 and 2; creatinine on 12/19 was 1.67-will repeat in a.m. on 08/19/2020 -Continue free water per tube and intermittently follow labs  Dysphagia, moderate protein calorie malnutrition Nutrition Problem: Inadequate oral intake Etiology: inability to eat Signs/Symptoms: NPO status  -Antonio Navarro now on D2 diet with thin liquids.  Not eating much secondary to continuous tube feedings and a sensation of feeling full and not being hungry.  Will contact nutrition regarding possibility of transitioning to nocturnal tube feedings. Interventions: Tube feeding,Prostat Estimated body mass index is 26.31 kg/m as calculated from the following:   Height as of this  encounter: 6' (1.829 m).   Weight as of this encounter: 88 kg.  Skin tear right lateral back Wound / Incision (Open or Dehisced) 07/28/20 Skin tear Back Lateral;Right;Upper (Active)  Date First Assessed/Time First Assessed: 07/28/20 0300   Wound Type: Skin tear  Location: Back  Location Orientation: Lateral;Right;Upper  Present on Admission: No    Assessments 07/28/2020  7:49 PM 08/29/2020  8:38 PM  Dressing Type None Foam - Lift dressing to assess site every shift  Dressing Status - Clean;Dry;Intact  Dressing Change Frequency - PRN  Site / Wound Assessment - Clean;Dry  Peri-wound Assessment - Intact  Wound Length (cm) - 0 cm  Wound Width (cm) - 0 cm  Wound Depth (cm) - 0 cm  Wound Volume (cm^3) - 0 cm^3  Wound Surface Area (cm^2) - 0 cm^2  Drainage Amount None None  Treatment Cleansed -     No Linked orders to display     Wound / Incision (Open or Dehisced) 08/04/20 (MASD) Moisture Associated Skin Damage Buttocks Right;Left (Active)  Date First Assessed/Time First Assessed: 08/04/20 0800   Wound Type: (MASD) Moisture Associated Skin Damage  Location: Buttocks  Location Orientation: Right;Left    Assessments 08/04/2020  8:16 AM 08/30/2020  8:11 AM  Dressing Type Foam - Lift dressing to assess site every shift Foam - Lift dressing to assess site every shift  Dressing Changed New -  Dressing Status Clean;Dry;Intact -  Dressing Change Frequency PRN  PRN  Site / Wound Assessment - Dry;Clean  Treatment Cleansed -     No Linked orders to display    Other problems: Acute hypernatremia -Continue free water per tube  Hyperglycemia without an underlying diagnosis of diabetes mellitus -Likely related to acute stress -CBGs while on tube feedings controlled with sliding scale insulin and high-dose Levemir insulin BID CBGs well-controlled at this juncture -Consider change to Glucerna tube feeding Levemir dosage can be decreased to minimize risk for unintended hypoglycemia -Hemoglobin A1c  4.7   Acute hypoxemic respiratory failure -Resolved -Stable on room air with O2 sats between 97 and 99%   Data Reviewed: Basic Metabolic Panel: Recent Labs  Lab 08/25/20 0303 08/26/20 0425  NA 137 140  K 4.1 4.2  CL 99 101  CO2 28 30  GLUCOSE 146* 146*  BUN 20 20  CREATININE 1.34* 1.30*  CALCIUM 9.5 9.8   Liver Function Tests: Recent Labs  Lab 08/25/20 0303  AST 58*  ALT 133*  ALKPHOS 85  BILITOT 0.7  PROT 7.0  ALBUMIN 3.2*   No results for input(s): LIPASE, AMYLASE in the last 168 hours. No results for input(s): AMMONIA in the last 168 hours. CBC: Recent Labs  Lab 08/25/20 0303 08/26/20 0425  WBC 7.3 7.2  HGB 10.9* 11.7*  HCT 34.6* 37.3*  MCV 106.1* 105.7*  PLT 162 160   Cardiac Enzymes: No results for input(s): CKTOTAL, CKMB, CKMBINDEX, TROPONINI in the last 168 hours. BNP (last 3 results) No results for input(s): BNP in the last 8760 hours.  ProBNP (last 3 results) No results for input(s): PROBNP in the last 8760 hours.  CBG: Recent Labs  Lab 08/30/20 1557 08/30/20 2114 08/31/20 0036 08/31/20 0417 08/31/20 0951  GLUCAP 129* 148* 116* 153* 163*    No results found for this or any previous visit (from the past 240 hour(s)).   Studies: No results found.  Scheduled Meds: . amantadine  100 mg Per Tube Daily  . aspirin  81 mg Per Tube Daily  . atorvastatin  80 mg Per Tube Daily  . bethanechol  10 mg Per Tube TID  . carvedilol  25 mg Per Tube BID WC  . chlorhexidine gluconate (MEDLINE KIT)  15 mL Mouth Rinse BID  . feeding supplement (PROSource TF)  45 mL Per Tube BID  . free water  200 mL Per Tube Q3H  . heparin  5,000 Units Subcutaneous Q8H  . insulin aspart  0-9 Units Subcutaneous Q4H  . insulin detemir  30 Units Subcutaneous BID  . isosorbide-hydrALAZINE  1 tablet Per Tube TID  . mouth rinse  15 mL Mouth Rinse BID  . polyvinyl alcohol  1 drop Both Eyes QID  . QUEtiapine  25 mg Per Tube QHS  . sodium chloride flush  3 mL Intravenous  Q12H  . ticagrelor  90 mg Per Tube BID   Continuous Infusions: . sodium chloride 250 mL (08/17/20 0812)  . feeding supplement (JEVITY 1.5 CAL/FIBER) 1,000 mL (08/30/20 2102)    Active Problems:   Acute ST elevation myocardial infarction (STEMI) (HCC)   Cardiac arrest (HCC)   Elevated blood pressure reading with diagnosis of hypertension   Shock circulatory (HCC)   Encephalopathy   History of ETT   Cardiomyopathy, ischemic   Anoxic brain damage [facility])   Dysphagia   DNR (do not resuscitate) discussion   Gait instability   Consultants:  Cardiology  Interventional radiology  PCCM  Neurology  Procedures:  Cardiac catheterization 12/4  Continuous  EEG 12/4 and overnight EEG with video 12/5  Echocardiogram 12/5  EEG 12/9  Percutaneous PEG tube placement 12/30  Antibiotics: Anti-infectives (From admission, onward)   Start     Dose/Rate Route Frequency Ordered Stop   08/17/20 0000  ceFAZolin (ANCEF) IVPB 2g/100 mL premix        2 g 200 mL/hr over 30 Minutes Intravenous To Radiology 08/15/20 1346 08/17/20 1111   07/22/20 0830  cefTRIAXone (ROCEPHIN) 2 g in sodium chloride 0.9 % 100 mL IVPB        2 g 200 mL/hr over 30 Minutes Intravenous Every 24 hours 07/22/20 0720 07/26/20 0858       Time spent: 20 minutes    Erin Hearing ANP  [facility] 7 am - 330 pm/M-F for direct Antonio Navarro care and secure chat Please refer to Amion for contact information 40 days

## 2020-08-31 NOTE — Progress Notes (Signed)
Patient ID: Antonio Navarro, male   DOB: 1961/02/21, 60 y.o.   MRN: [id number]  This NP reviewed medical records , received report from team and then visited patient at the bedside as a follow up  for palliative medicine needs and emotional support.  Patient is awake and makes eye contact when spoken to.  He is lethargic and unable to follow commands today.  Today I spoke son by telephone to offer emotional support, and answer questions and concerns regarding current medical situation.  Patient does not have medical decisional capacity at this time .   His legal decision maker is his adult son [name]  Ongoing education regarding diagnosis and likely long-term trajectory secondary to anoxic brain injury.  Education offered on the importance of making medical decisions that are within the context of the patient's values and goals of care.  Education offered on the difference between aggressive medical intervention path and a palliative comfort path for this patient at this time in the situation.  I shared with his son my concern that his dad is high risk for decompensation into the future secondary to the fact that he is nonmobile, total care and risk for aspiration resulting in cyclical infection.  Education offered on the importance of documentation of advanced care planning, raised awareness specifically to [place]. Encouraged family to consider DNR/DNI status understanding evidenced based poor outcomes in similar hospitalized patient, as the cause of arrest is likely associated with advanced chronic illness rather than an easily reversible acute cardio-pulmonary event.  Education offered on the likely next steps of transition of care to a skilled nursing facility for ongoing rehabilitation.  Questions and concerns addressed   PMT will continue to support holistically.  [name] is  encouraged to call with questions or concerns.  Emotional support offered.  Total time spent on the unit was 35  minutes  Greater than 50% of the time was spent in counseling and coordination of care  Wadie Lessen NP  Palliative Medicine Team Team Phone # [phone] Pager [phone]

## 2020-09-01 LAB — GLUCOSE, CAPILLARY
Glucose-Capillary: 115 mg/dL — ABNORMAL HIGH (ref 70–99)
Glucose-Capillary: 123 mg/dL — ABNORMAL HIGH (ref 70–99)
Glucose-Capillary: 130 mg/dL — ABNORMAL HIGH (ref 70–99)
Glucose-Capillary: 133 mg/dL — ABNORMAL HIGH (ref 70–99)
Glucose-Capillary: 160 mg/dL — ABNORMAL HIGH (ref 70–99)
Glucose-Capillary: 173 mg/dL — ABNORMAL HIGH (ref 70–99)
Glucose-Capillary: 262 mg/dL — ABNORMAL HIGH (ref 70–99)
Glucose-Capillary: 39 mg/dL — CL (ref 70–99)
Glucose-Capillary: 98 mg/dL (ref 70–99)

## 2020-09-01 MED ORDER — INSULIN ASPART 100 UNIT/ML ~~LOC~~ SOLN
0.0000 [IU] | SUBCUTANEOUS | Status: DC
  Administered 2020-09-01 – 2020-09-03 (×3): 1 [IU] via SUBCUTANEOUS

## 2020-09-01 MED ORDER — JEVITY 1.5 CAL/FIBER PO LIQD
1000.0000 mL | ORAL | Status: DC
  Administered 2020-09-01: 1000 mL
  Filled 2020-09-01: qty 1000

## 2020-09-01 MED ORDER — DEXTROSE 50 % IV SOLN
INTRAVENOUS | Status: AC
  Administered 2020-09-01: 25 g via INTRAVENOUS
  Filled 2020-09-01: qty 50

## 2020-09-01 MED ORDER — INSULIN DETEMIR 100 UNIT/ML ~~LOC~~ SOLN
15.0000 [IU] | Freq: Two times a day (BID) | SUBCUTANEOUS | Status: DC
  Administered 2020-09-01 – 2020-09-04 (×7): 15 [IU] via SUBCUTANEOUS
  Filled 2020-09-01 (×9): qty 0.15

## 2020-09-01 MED ORDER — BACITRACIN-NEOMYCIN-POLYMYXIN OINTMENT TUBE
TOPICAL_OINTMENT | CUTANEOUS | Status: DC | PRN
  Administered 2020-09-01: 1 via TOPICAL
  Filled 2020-09-01: qty 14

## 2020-09-01 MED ORDER — JEVITY 1.5 CAL/FIBER PO LIQD
780.0000 mL | ORAL | Status: DC
  Administered 2020-09-01 – 2020-09-02 (×2): 780 mL
  Filled 2020-09-01 (×3): qty 1000

## 2020-09-01 MED ORDER — DEXTROSE 50 % IV SOLN: 25.0000 g | INTRAVENOUS | Status: AC

## 2020-09-01 NOTE — Progress Notes (Addendum)
[facility] PROGRESS NOTE  Antonio Navarro ZOX:[id number] DOB: [birth year] DOA: 07/22/2020 PCP: Patient, No Pcp Per       Status: Remains inpatient appropriate because:Altered mental status and Unsafe d/c plan   Dispo: The patient is from: Home              Anticipated d/c is to: SNF              Anticipated d/c date is: > 3 days              Patient currently is medically stable to d/c.  Barriers to discharge: Medicaid and disability pending.Family unable to provide adequate care 24/7 after dc so declined by CIR-focus is now on SNF   Code Status: Full Family Communication: 1/06 spoke with patient's sister [name] -/11 Sister also at bedside and was updated by case management staff DVT prophylaxis: sq heparin Vaccination status: Completed both doses of Pfizer Covid vaccination on 12/14/2019  Foley catheter: No  HPI: 60 year old male history of hypertension prescribed Norvasc prior to admission, chronic back pain, prior renal calculi who experienced an out of hospital cardiac arrest with downtime of 9 minutes.  Initial rhythm was ventricular fibrillation.  He was defibrillated deceived 6 doses of epi before ROSC.  Subsequent work-up revealed STEMI.  He underwent cardiac catheterization with a DES placed to the circumflex.  Cardiology following and has placed patient on Brilinta and aspirin and is also being treated with beta-blocker and statins.  From a neurological standpoint he clinically has anoxic brain injury.  During this hospitalization he experienced AKI and was treated with fluids and correction of hypoperfusion from cardiac arrest.  Echo performed this admission revealed ischemic cardiomyopathy with an EF of 40%  In addition to above patient has been unable to pass swallowing evaluations and subsequently has undergone percutaneous PEG tube placement on 12/30.  Patient is awake and moving all extremities spontaneously but does not follow commands and does not verbalize  palliative care has met with patient and family and has emphasized poor prognosis and will continue to meet with the family during the hospitalization.  Current plan is to transition to SNF bed once available.  Expect need for lifelong 24/7 care for all ADLs.  Subjective: Awake.  No specific complaints.  Objective: Vitals:   09/01/20 0853 09/01/20 1122  BP: (!) 138/96 114/75  Pulse: 90 86  Resp: (!) 22 (!) 22  Temp:    SpO2: 100% 99%    Intake/Output Summary (Last 24 hours) at 09/01/2020 1238 Last data filed at 09/01/2020 1034 Gross per 24 hour  Intake 1413 ml  Output 600 ml  Net 813 ml   Filed Weights   08/29/20 0842 08/31/20 0132 09/01/20 0518  Weight: 85.9 kg 88 kg 88.4 kg    Exam:  Constitutional: Awake, calm, no acute distress Respiratory: Stable on room air, bilateral lung sounds clear Cardiovascular: Pulses regular, normal heart sounds, adequate capillary refill Abdomen:soft and nontender, normoactive bowel sounds, LBM 1/12 Neurologic: CN 2-12 roughly without any appreciable focal neurological deficits, MOE x4, strength is 4/5.   Psychiatric: Awake, oriented to name only. Flat affect   Assessment/Plan: Acute problems: Out of hospital cardiac arrest secondary to STEMI -ROSC and subsequent catheterization revealed disease in circumflex requiring DES -Continue beta-blocker, statin; Brilinta which had been held briefly for PEG tube resumed on 12/30 by cardiology -Denies chest pain  Ischemic cardiomyopathy -Echo this admission EF 40% -Continue pharmacotherapy as above -No ACE inhibitor secondary to underlying kidney  disease -Continue hydralazine and Isordil -Remains euvolemic -Likely will need follow-up echo 3 months post initial echo  Significant anoxic brain injury secondary to cardiac arrest/deconditioning/gait instability -Consistently participating with PT and OT.  SLP has cleared for dysphagia 2 with thin liquids -Awaiting confirmation from family that they  can provide 24/7 care for several weeks after discharge from CIR -PT will make attempts to increase frequency of therapy including possible daily therapy alternating between PT and OT -No hypertonicity or spasticity observed -Continue Seroquel 25 mg at HS for insomnia and brain injury sequela -Continue daytime Symmetrel to help contain daytime alertness in context of brain injury -PT session 1/10: Patient interested in participating in trying to stand but cognitively was struggling with a series of events that must occur in order to stand reliably.  Hypertension -Was on Norvasc prior to admission -Continue cardiovascular meds as listed above  Nonsustained VT -Continue amiodarone and beta-blocker -LFTs were repeated on 1/7 with stable AST but ALT has increased from 52 to 133 -we will need to repeat again in 1 to 2 weeks to ensure stability -Cardiology managing  Acute on chronic kidney disease stage IIIb -Suspect prerenal etiology in the setting of hypoperfusion from ventricular fibrillation arrest -Baseline creatinine between 1.6 and 2; creatinine on 12/19 was 1.67-will repeat in a.m. on 08/19/2020 -Continue free water per tube and intermittently follow labs  Dysphagia, moderate protein calorie malnutrition Nutrition Problem: Inadequate oral intake Etiology: inability to eat Signs/Symptoms: NPO status  -patient now on D2 diet with thin liquids.  Not eating much secondary to continuous tube feedings and a sensation of feeling full and not being hungry.  Will feed for 12 hrs from 10 pm to 10 am at 65/hr Interventions: Tube feeding,Prostat Estimated body mass index is 26.43 kg/m as calculated from the following:   Height as of this encounter: 6' (1.829 m).   Weight as of this encounter: 88.4 kg.  Skin tear right lateral back Wound / Incision (Open or Dehisced) 07/28/20 Skin tear Back Lateral;Right;Upper (Active)  Date First Assessed/Time First Assessed: 07/28/20 0300   Wound Type: Skin  tear  Location: Back  Location Orientation: Lateral;Right;Upper  Present on Admission: No    Assessments 07/28/2020  7:49 PM 08/29/2020  8:38 PM  Dressing Type None Foam - Lift dressing to assess site every shift  Dressing Status -- Clean;Dry;Intact  Dressing Change Frequency -- PRN  Site / Wound Assessment -- Clean;Dry  Peri-wound Assessment -- Intact  Wound Length (cm) -- 0 cm  Wound Width (cm) -- 0 cm  Wound Depth (cm) -- 0 cm  Wound Volume (cm^3) -- 0 cm^3  Wound Surface Area (cm^2) -- 0 cm^2  Drainage Amount None None  Treatment Cleansed --     No Linked orders to display     Wound / Incision (Open or Dehisced) 08/04/20 (MASD) Moisture Associated Skin Damage Buttocks Right;Left (Active)  Date First Assessed/Time First Assessed: 08/04/20 0800   Wound Type: (MASD) Moisture Associated Skin Damage  Location: Buttocks  Location Orientation: Right;Left    Assessments 08/04/2020  8:16 AM 08/30/2020  8:11 AM  Dressing Type Foam - Lift dressing to assess site every shift Foam - Lift dressing to assess site every shift  Dressing Changed New --  Dressing Status Clean;Dry;Intact --  Dressing Change Frequency PRN PRN  Site / Wound Assessment -- Dry;Clean  Treatment Cleansed --     No Linked orders to display    Other problems: Acute hypernatremia -Continue free water per tube  Hyperglycemia without an underlying diagnosis of diabetes mellitus -Likely related to acute stress -Recent issues with hypoglycemia so sliding scale insulin decreased to very sensitive and Levemir decreased from 30 twice daily to 15 twice daily as of 1/14 -Hemoglobin A1c 4.7   Acute hypoxemic respiratory failure -Resolved -Stable on room air with O2 sats between 97 and 99%   Data Reviewed: Basic Metabolic Panel: Recent Labs  Lab 08/26/20 0425  NA 140  K 4.2  CL 101  CO2 30  GLUCOSE 146*  BUN 20  CREATININE 1.30*  CALCIUM 9.8   Liver Function Tests: No results for input(s): AST, ALT, ALKPHOS,  BILITOT, PROT, ALBUMIN in the last 168 hours. No results for input(s): LIPASE, AMYLASE in the last 168 hours. No results for input(s): AMMONIA in the last 168 hours. CBC: Recent Labs  Lab 08/26/20 0425  WBC 7.2  HGB 11.7*  HCT 37.3*  MCV 105.7*  PLT 160   Cardiac Enzymes: No results for input(s): CKTOTAL, CKMB, CKMBINDEX, TROPONINI in the last 168 hours. BNP (last 3 results) No results for input(s): BNP in the last 8760 hours.  ProBNP (last 3 results) No results for input(s): PROBNP in the last 8760 hours.  CBG: Recent Labs  Lab 09/01/20 0045 09/01/20 0115 09/01/20 0159 09/01/20 0359 09/01/20 0919  GLUCAP 39* 262* 160* 98 130*    No results found for this or any previous visit (from the past 240 hour(s)).   Studies: No results found.  Scheduled Meds: . amantadine  100 mg Per Tube Daily  . aspirin  81 mg Per Tube Daily  . atorvastatin  80 mg Per Tube Daily  . bethanechol  10 mg Per Tube TID  . carvedilol  25 mg Per Tube BID WC  . chlorhexidine gluconate (MEDLINE KIT)  15 mL Mouth Rinse BID  . feeding supplement (PROSource TF)  45 mL Per Tube BID  . free water  200 mL Per Tube Q3H  . heparin  5,000 Units Subcutaneous Q8H  . insulin aspart  0-6 Units Subcutaneous Q4H  . insulin detemir  30 Units Subcutaneous BID  . isosorbide-hydrALAZINE  1 tablet Per Tube TID  . mouth rinse  15 mL Mouth Rinse BID  . polyvinyl alcohol  1 drop Both Eyes QID  . QUEtiapine  25 mg Per Tube QHS  . sodium chloride flush  3 mL Intravenous Q12H  . ticagrelor  90 mg Per Tube BID   Continuous Infusions: . sodium chloride 250 mL (08/17/20 0812)  . feeding supplement (JEVITY 1.5 CAL/FIBER) 1,000 mL (08/31/20 1402)    Active Problems:   Acute ST elevation myocardial infarction (STEMI) (HCC)   Cardiac arrest (HCC)   Elevated blood pressure reading with diagnosis of hypertension   Shock circulatory (HCC)   Encephalopathy   History of ETT   Cardiomyopathy, ischemic   Anoxic brain  damage [facility])   Dysphagia   DNR (do not resuscitate) discussion   Gait instability   Consultants:  Cardiology  Interventional radiology  PCCM  Neurology  Procedures:  Cardiac catheterization 12/4  Continuous EEG 12/4 and overnight EEG with video 12/5  Echocardiogram 12/5  EEG 12/9  Percutaneous PEG tube placement 12/30  Antibiotics: Anti-infectives (From admission, onward)   Start     Dose/Rate Route Frequency Ordered Stop   08/17/20 0000  ceFAZolin (ANCEF) IVPB 2g/100 mL premix        2 g 200 mL/hr over 30 Minutes Intravenous To Radiology 08/15/20 1346 08/17/20 1111   07/22/20  0830  cefTRIAXone (ROCEPHIN) 2 g in sodium chloride 0.9 % 100 mL IVPB        2 g 200 mL/hr over 30 Minutes Intravenous Every 24 hours 07/22/20 0720 07/26/20 0858       Time spent: 20 minutes    Erin Hearing ANP  [facility] 7 am - 330 pm/M-F for direct patient care and secure chat Please refer to Amion for contact information 41 days

## 2020-09-01 NOTE — Progress Notes (Signed)
PT Cancellation Note  Patient Details Name: Antonio Navarro MRN: [id number] DOB: 11-25-1960   Cancelled Treatment:    Reason Eval/Treat Not Completed: Fatigue/lethargy limiting ability to participate;Medical issues which prohibited therapy 1st attempt pt fatigued and refused, 2nd attempt deferred due to agitation, pt needing to have bowel movement and has become agitated.  Lyanne Co, DPT Acute Rehabilitation Services [phone]   [place] 09/01/2020, 12:38 PM

## 2020-09-01 NOTE — Progress Notes (Signed)
0023- 1 unit novolog given per SSI for CBG of 123  0045- Tech reported CBG of 39   0101- 25g of D50 given  0115- CBG recheck after 15 minutes = 262  Will recheck in 1 hour

## 2020-09-01 NOTE — Progress Notes (Signed)
  Speech Language Pathology Treatment: Dysphagia;Cognitive-Linquistic  Patient Details Name: Antonio Navarro MRN: [id number] DOB: June 29, 1961 Today's Date: 09/01/2020 Time: 1444-1500 SLP Time Calculation (min) (ACUTE ONLY): 16 min  Assessment / Plan / Recommendation Clinical Impression  Pt was seen for treatment. He was adequately alert initially to participate, but his level of alertness waned as the session progressed and he exhibited difficulty maintaining alertness. Pt tolerated puree solids, dysphagia 2 solids and thin liquids via straw without overt s/sx of aspiration. Mastication was [facility] and minimal residue was cleared with a liquid wash. More advanced solids were refused despite encouragement. He required cues to attend to tasks and processing speed was reduced. Following extended processing time, pt required cues to recall the the conversational topic and/or the question. The session was ultimately abbreviated due to pt's increased difficulty maintaining alertness. It is recommended that the current dysphagia 2 diet be continued and SLP will continue to follow pt.    HPI HPI: Pt is a 60 yo male s/p arrest with downtime 9 minutes. Initial rhythm was V. fib, shocked and received 6 rounds of epi before ROSC achieved.  Total CPR time was 30 minutes. ETT 12/4-12/13. MRI 12/7 suggestive of a widespread anoxic injury. PMH includes kidney stones and HTN.      SLP Plan  Continue with current plan of care       Recommendations  Diet recommendations: Dysphagia 2 (fine chop);Thin liquid Liquids provided via: Cup;Straw Medication Administration: Via alternative means Supervision: Staff to assist with self feeding;Full supervision/cueing for compensatory strategies Compensations: Minimize environmental distractions;Slow rate;Small sips/bites Postural Changes and/or Swallow Maneuvers: Seated upright 90 degrees                Oral Care Recommendations: Oral care BID Follow up Recommendations:  Skilled Nursing facility SLP Visit Diagnosis: Dysphagia, oral phase (R13.11) Plan: Continue with current plan of care       Antonio Navarro I. Hardin Negus, [place], [place] Office number [phone] Pager [place] 09/01/2020, 3:06 PM

## 2020-09-01 NOTE — Progress Notes (Signed)
Tube site care completed. Site cleansed with soap and water. Triple antibiotic cream applied. New split gauze dressing applied and secured.

## 2020-09-02 LAB — GLUCOSE, CAPILLARY
Glucose-Capillary: 102 mg/dL — ABNORMAL HIGH (ref 70–99)
Glucose-Capillary: 113 mg/dL — ABNORMAL HIGH (ref 70–99)
Glucose-Capillary: 118 mg/dL — ABNORMAL HIGH (ref 70–99)
Glucose-Capillary: 120 mg/dL — ABNORMAL HIGH (ref 70–99)
Glucose-Capillary: 126 mg/dL — ABNORMAL HIGH (ref 70–99)
Glucose-Capillary: 97 mg/dL (ref 70–99)

## 2020-09-02 NOTE — Progress Notes (Signed)
[facility]  PROGRESS NOTE  Antonio Navarro CWC:[id number] DOB: 1960/09/28 DOA: 07/22/2020 PCP: Patient, No Pcp Per   Brief HPI:   60 year old male with history of hypertension, chronic back pain, prior renal colic who experienced out of hospital cardiac arrest with downtime of 9 minutes.  Initial rhythm was ventricular fibrillation.  He was defibrillated, received 6 doses of epi before ROSC.  Subsequent work-up revealed STEMI.  He underwent cardiac catheterization with a DES placed to circumflex.  Cardiology following and has placed patient on Brilinta and aspirin and also being treated with beta-blockers and statins.  Neurological standpoint he has anoxic brain injury.  Patient was unable to swallow, underwent percutaneous PEG placement on 08/17/2021.  He is awake, moving all extremities but does not follow commands.  Current plan to transition to skilled nursing facility once bed becomes available.    Subjective   Patient seen and examined, no new complaints.   Assessment/Plan:     1. Out of hospital cardiac arrest secondary to STEMI-underwent defibrillation, ROSC with subsequent catheterization revealed disease in circumflex requiring DES.  Continue beta-blocker, Brilinta, aspirin.  Cardiology following. 2. Ischemic cardiomyopathy-patient underwent cardiac catheterization with DES to circumflex as above.  Continue beta-blocker, Brilinta, aspirin.  Continue hydralazine and Isordil. 3. Significant anoxic brain injury-secondary to cardiac arrest, patient has severe cognitive limitations.  Consistently work with PT/OT.  No behavior disturbance.  Continue Seroquel 25 mg nightly for insomnia.  Continue daytime Symmetrel. 4. Hypertension-continue Coreg.  Blood pressures well controlled. 5. Nonsustained V. tach-continue amiodarone, Coreg. 6. Dysphagia-patient is on tube feed, prostat  at bedtime.  From 10 PM to 10 AM at 65 mL/h.     COVID-19 Labs  No results for input(s): DDIMER,  FERRITIN, LDH, CRP in the last 72 hours.  Lab Results  Component Value Date   [place] NEGATIVE 07/22/2020     Scheduled medications:   . amantadine  100 mg Per Tube Daily  . aspirin  81 mg Per Tube Daily  . atorvastatin  80 mg Per Tube Daily  . bethanechol  10 mg Per Tube TID  . carvedilol  25 mg Per Tube BID WC  . chlorhexidine gluconate (MEDLINE KIT)  15 mL Mouth Rinse BID  . feeding supplement (PROSource TF)  45 mL Per Tube BID  . free water  200 mL Per Tube Q3H  . heparin  5,000 Units Subcutaneous Q8H  . insulin aspart  0-6 Units Subcutaneous Q4H  . insulin detemir  15 Units Subcutaneous BID  . isosorbide-hydrALAZINE  1 tablet Per Tube TID  . mouth rinse  15 mL Mouth Rinse BID  . polyvinyl alcohol  1 drop Both Eyes QID  . QUEtiapine  25 mg Per Tube QHS  . sodium chloride flush  3 mL Intravenous Q12H  . ticagrelor  90 mg Per Tube BID         CBG: Recent Labs  Lab 09/01/20 1645 09/01/20 2046 09/02/20 0041 09/02/20 0410 09/02/20 0739  GLUCAP 115* 133* 126* 118* 120*    SpO2: 99 % O2 Flow Rate (L/min): 4 L/min FiO2 (%): 36 %      Antibiotics: Anti-infectives (From admission, onward)   Start     Dose/Rate Route Frequency Ordered Stop   08/17/20 0000  ceFAZolin (ANCEF) IVPB 2g/100 mL premix        2 g 200 mL/hr over 30 Minutes Intravenous To Radiology 08/15/20 1346 08/17/20 1111   07/22/20 0830  cefTRIAXone (ROCEPHIN) 2 g in sodium chloride 0.9 %  100 mL IVPB        2 g 200 mL/hr over 30 Minutes Intravenous Every 24 hours 07/22/20 0720 07/26/20 0858       DVT prophylaxis: Heparin  Code Status: Full code  Family Communication: No family at bedside   Consultants:  Cardiology  Intervention radiology  PCCM  Neurology  Procedures:  Cardiac catheterization 07/22/2020  Echocardiogram  EEG  PEG tube placement  Continuous EEG on 07/22/2020 and overnight EEG with video 07/23/2020    Objective   Vitals:   09/01/20 2050 09/02/20 0043  09/02/20 0414 09/02/20 0551  BP: (!) 147/82   (!) 131/92  Pulse: 82   81  Resp: 16   16  Temp: 98 F (36.7 C)   98 F (36.7 C)  TempSrc: Oral   Oral  SpO2: 100%   99%  Weight:  89.1 kg 89.1 kg   Height:        Intake/Output Summary (Last 24 hours) at 09/02/2020 1149 Last data filed at 09/02/2020 0900 Gross per 24 hour  Intake 700 ml  Output 1450 ml  Net -750 ml    01/13 1901 - 01/15 0700 In: 7619 [P.O.:120; I.V.:3] Out: 1450 [Urine:1450]  Filed Weights   09/01/20 0518 09/02/20 0043 09/02/20 0414  Weight: 88.4 kg 89.1 kg 89.1 kg    Physical Examination:    General-appears in no acute distress  Heart-S1-S2, regular, no murmur auscultated  Lungs-clear to auscultation bilaterally, no wheezing or crackles auscultated  Abdomen-soft, nontender, no organomegaly  Extremities-no edema in the lower extremities  Neuro-alert, oriented to self only   Status is: Inpatient  Dispo: The patient is from: Home              Anticipated d/c is to: Skilled nursing facility              Anticipated d/c date is: 09/05/2020              Patient currently medically stable for discharge  Barrier to discharge-awaiting bed at skilled nursing facility      [place] If 7PM-7AM, please contact night-coverage at [email or web address], Office  [phone]   09/02/2020, 11:49 AM  LOS: 42 days

## 2020-09-02 NOTE — Plan of Care (Signed)
  Problem: Health Behavior/Discharge Planning: Goal: Ability to manage health-related needs will improve Outcome: Not Progressing   Problem: Clinical Measurements: Goal: Ability to maintain clinical measurements within normal limits will improve Outcome: Progressing Goal: Will remain free from infection Outcome: Progressing Goal: Diagnostic test results will improve Outcome: Progressing Goal: Respiratory complications will improve Outcome: Progressing Goal: Cardiovascular complication will be avoided Outcome: Progressing   Problem: Activity: Goal: Risk for activity intolerance will decrease Outcome: Progressing   Problem: Nutrition: Goal: Adequate nutrition will be maintained Outcome: Progressing   Problem: Coping: Goal: Level of anxiety will decrease Outcome: Progressing   Problem: Elimination: Goal: Will not experience complications related to bowel motility Outcome: Progressing Goal: Will not experience complications related to urinary retention Outcome: Progressing   Problem: Safety: Goal: Ability to remain free from injury will improve Outcome: Progressing   Problem: Skin Integrity: Goal: Risk for impaired skin integrity will decrease Outcome: Progressing   Problem: Education: Goal: Ability to manage disease process will improve Outcome: Not Progressing   Problem: Cardiac: Goal: Ability to achieve and maintain adequate cardiopulmonary perfusion will improve Outcome: Progressing   Problem: Neurologic: Goal: Promote progressive neurologic recovery Outcome: Progressing   Problem: Skin Integrity: Goal: Risk for impaired skin integrity will be minimized. Outcome: Progressing   Problem: Education: Goal: Ability to demonstrate management of disease process will improve Outcome: Not Progressing Goal: Ability to verbalize understanding of medication therapies will improve Outcome: Not Progressing   Problem: Activity: Goal: Capacity to carry out activities  will improve Outcome: Not Progressing   Problem: Cardiac: Goal: Ability to achieve and maintain adequate cardiopulmonary perfusion will improve Outcome: Progressing

## 2020-09-02 NOTE — Progress Notes (Signed)
Nutrition Follow-up  DOCUMENTATION CODES:   Not applicable  INTERVENTION:   -Initiate 48 hour calorie count -Feeding assistance with meals -Magic cup TID with meals, each supplement provides 290 kcal and 9 grams of protein -Continue nocturnal feedings:   Initiate Jevity 1.5 @ 65 ml/hr via PEG over 12 hour period  45 ml Prosource TF BID    Continue 200 ml free water flush every 3 hours  Tube feeding regimen provides 1250 kcal (54% of needs), 72 grams of protein, and 593 ml of H2O. Total free water: 2193 ml daily   NUTRITION DIAGNOSIS:   Inadequate oral intake related to inability to eat as evidenced by NPO status.  Progressing; advanced to dysphagia 2 diet on 08/29/20  GOAL:   Patient will meet greater than or equal to 90% of their needs  Progressing   MONITOR:   Diet advancement,Labs,Weight trends,TF tolerance,Skin,I & O's  REASON FOR ASSESSMENT:   Consult,Ventilator Enteral/tube feeding initiation and management  ASSESSMENT:   Patient with PMH significant for HTN and kidney stones. Presents this admission s/p cardiac arrest.  12/13- NGT d/c 12/15- cortrak placed, tip of tube in the stomach 12/17- s/p BSE- recommend continue NPO 12/19- pt pulled cortrak tube, replaced- placement verified by x-ray (stomach) 12/20- s/p BSE- pt refusing PO trials 12/30- cortrak removed, PEG placed 1/5- Advanced to thin liquid diet 1/6- Advanced to full liquid diet 1/11- Advanced to dysphagia 2 diet  1/14- transitioned to nocturnal feedings by MD  Reviewed I/O's: -547 ml x 24 hours and +11 L since 08/19/20  UOP: 1.5 L x 24 hours  Pt unavailable at time of attempted contact.   Pt remains on dysphagia 2 diet, however, with poor oral intake. MD transitioned to nocturnal feedings yesterday. However, intake continues to be poor. RD to initiate calorie count to better monitor intake and determine if TF regimen will need to be adjusted to better meet needs  09/01/20 Breakfast: 820  kcals, 3 grams protein Lunch: 0 Dinner: nothing documented  Total intake: 820 kcal (36% of minimum estimated needs)  3 grams protein (3% of minimum estimated needs)  09/02/20 Breakfast: 186 kcals, 8 grams protein  Total intake: 186 kcal (8% of minimum estimated needs)  8 protein (7% of minimum estimated needs)  Labs reviewed: CBGS: 113-126 (inpatient orders for glycemic control are 0-6 units insulin aspart every 4 hours and 15 units insulin detemir BID).   Diet Order:   Diet Order            DIET DYS 2 Room service appropriate? No; Fluid consistency: Thin  Diet effective now                 EDUCATION NEEDS:   Not appropriate for education at this time  Skin:  Skin Assessment: Skin Integrity Issues: Skin Integrity Issues:: Other (Comment) Other: MASD to buttocks, skin tear to rt upper back  Last BM:  08/26/20  Height:   Ht Readings from Last 1 Encounters:  07/22/20 6' (1.829 m)    Weight:   Wt Readings from Last 1 Encounters:  09/02/20 89.1 kg    Ideal Body Weight:  80.9 kg  BMI:  Body mass index is 26.64 kg/m.  Estimated Nutritional Needs:   Kcal:  2300-2500 kcal  Protein:  115-130 grams  Fluid:  >/= 2 L/day    Loistine Chance, RD, LDN, [place] Registered Dietitian II Certified Diabetes Care and Education Specialist Please refer to [facility] for RD and/or RD on-call/weekend/after hours pager

## 2020-09-03 LAB — GLUCOSE, CAPILLARY
Glucose-Capillary: 110 mg/dL — ABNORMAL HIGH (ref 70–99)
Glucose-Capillary: 114 mg/dL — ABNORMAL HIGH (ref 70–99)
Glucose-Capillary: 132 mg/dL — ABNORMAL HIGH (ref 70–99)
Glucose-Capillary: 138 mg/dL — ABNORMAL HIGH (ref 70–99)
Glucose-Capillary: 144 mg/dL — ABNORMAL HIGH (ref 70–99)
Glucose-Capillary: 148 mg/dL — ABNORMAL HIGH (ref 70–99)
Glucose-Capillary: 152 mg/dL — ABNORMAL HIGH (ref 70–99)

## 2020-09-03 MED ORDER — JEVITY 1.5 CAL/FIBER PO LIQD
980.0000 mL | ORAL | Status: DC
  Administered 2020-09-03: 1000 mL
  Filled 2020-09-03 (×4): qty 1000

## 2020-09-03 MED ORDER — PROSOURCE TF PO LIQD
45.0000 mL | Freq: Three times a day (TID) | ORAL | Status: DC
  Administered 2020-09-03 – 2020-09-11 (×22): 45 mL
  Filled 2020-09-03 (×27): qty 45

## 2020-09-03 NOTE — Progress Notes (Signed)
Pt does not c/o any pain. Pt's needs are met and safety measures are maintained. PEG tube is patent

## 2020-09-03 NOTE — Progress Notes (Signed)
[facility]  PROGRESS NOTE  YASIN DUCAT OEV:[id number] DOB: 13-Aug-1961 DOA: 07/22/2020 PCP: Patient, No Pcp Per   Brief HPI:   60 year old male with history of hypertension, chronic back pain, prior renal colic who experienced out of hospital cardiac arrest with downtime of 9 minutes.  Initial rhythm was ventricular fibrillation.  He was defibrillated, received 6 doses of epi before ROSC.  Subsequent work-up revealed STEMI.  He underwent cardiac catheterization with a DES placed to circumflex.  Cardiology following and has placed patient on Brilinta and aspirin and also being treated with beta-blockers and statins.  Neurological standpoint he has anoxic brain injury.  Patient was unable to swallow, underwent percutaneous PEG placement on 08/17/2021.  He is awake, moving all extremities but does not follow commands.  Current plan to transition to skilled nursing facility once bed becomes available.    Subjective   Patient seen and examined, denies any complaints.   Assessment/Plan:     1. Out of hospital cardiac arrest secondary to STEMI-underwent defibrillation, ROSC with subsequent catheterization revealed disease in circumflex requiring DES.  Continue beta-blocker, Brilinta, aspirin.  Cardiology following. 2. Ischemic cardiomyopathy-patient underwent cardiac catheterization with DES to circumflex as above.  Continue beta-blocker, Brilinta, aspirin.  Continue hydralazine and Isordil. 3. Significant anoxic brain injury-secondary to cardiac arrest, patient has severe cognitive limitations.  Consistently work with PT/OT.  No behavior disturbance.  Continue Seroquel 25 mg nightly for insomnia.  Continue daytime Symmetrel. 4. Hypertension-continue Coreg.  Blood pressures well controlled. 5. Nonsustained V. tach-continue amiodarone, Coreg. 6. Dysphagia-patient is on tube feed, prostat  at bedtime.  From 10 PM to 10 AM at 65 mL/h.     COVID-19 Labs  No results for input(s): DDIMER,  FERRITIN, LDH, CRP in the last 72 hours.  Lab Results  Component Value Date   [place] NEGATIVE 07/22/2020     Scheduled medications:   . amantadine  100 mg Per Tube Daily  . aspirin  81 mg Per Tube Daily  . atorvastatin  80 mg Per Tube Daily  . bethanechol  10 mg Per Tube TID  . carvedilol  25 mg Per Tube BID WC  . chlorhexidine gluconate (MEDLINE KIT)  15 mL Mouth Rinse BID  . feeding supplement (PROSource TF)  45 mL Per Tube TID  . free water  200 mL Per Tube Q3H  . heparin  5,000 Units Subcutaneous Q8H  . insulin aspart  0-6 Units Subcutaneous Q4H  . insulin detemir  15 Units Subcutaneous BID  . isosorbide-hydrALAZINE  1 tablet Per Tube TID  . mouth rinse  15 mL Mouth Rinse BID  . polyvinyl alcohol  1 drop Both Eyes QID  . QUEtiapine  25 mg Per Tube QHS  . sodium chloride flush  3 mL Intravenous Q12H  . ticagrelor  90 mg Per Tube BID         CBG: Recent Labs  Lab 09/02/20 1632 09/02/20 2015 09/03/20 0020 09/03/20 0405 09/03/20 0919  GLUCAP 102* 97 138* 152* 144*    SpO2: 98 % O2 Flow Rate (L/min): 4 L/min FiO2 (%): 36 %      Antibiotics: Anti-infectives (From admission, onward)   Start     Dose/Rate Route Frequency Ordered Stop   08/17/20 0000  ceFAZolin (ANCEF) IVPB 2g/100 mL premix        2 g 200 mL/hr over 30 Minutes Intravenous To Radiology 08/15/20 1346 08/17/20 1111   07/22/20 0830  cefTRIAXone (ROCEPHIN) 2 g in sodium chloride 0.9 %  100 mL IVPB        2 g 200 mL/hr over 30 Minutes Intravenous Every 24 hours 07/22/20 0720 07/26/20 0858       DVT prophylaxis: Heparin  Code Status: Full code  Family Communication: No family at bedside   Consultants:  Cardiology  Intervention radiology  PCCM  Neurology  Procedures:  Cardiac catheterization 07/22/2020  Echocardiogram  EEG  PEG tube placement  Continuous EEG on 07/22/2020 and overnight EEG with video 07/23/2020    Objective   Vitals:   09/02/20 1150 09/02/20 2019  09/03/20 0400 09/03/20 1051  BP: 118/69 (!) 162/98  129/81  Pulse: 79 76  85  Resp: 18 20    Temp: 98.4 F (36.9 C) 97.9 F (36.6 C)    TempSrc: Oral Oral    SpO2: 100% 100%  98%  Weight:   86.5 kg   Height:   6' (1.829 m)     Intake/Output Summary (Last 24 hours) at 09/03/2020 1125 Last data filed at 09/03/2020 0500 Gross per 24 hour  Intake 660 ml  Output 1150 ml  Net -490 ml    01/14 1901 - 01/16 0700 In: 780 [P.O.:180] Out: 2600 [Urine:2600]  Filed Weights   09/02/20 0043 09/02/20 0414 09/03/20 0400  Weight: 89.1 kg 89.1 kg 86.5 kg    Physical Examination:    General-appears in no acute distress  Heart-S1-S2, regular, no murmur auscultated  Lungs-clear to auscultation bilaterally, no wheezing or crackles auscultated  Abdomen-soft, nontender, no organomegaly  Extremities-no edema in the lower extremities  Neuro-alert, oriented to self only   Status is: Inpatient  Dispo: The patient is from: Home              Anticipated d/c is to: Skilled nursing facility              Anticipated d/c date is: 09/05/2020              Patient currently medically stable for discharge  Barrier to discharge-awaiting bed at skilled nursing facility      [place] If 7PM-7AM, please contact night-coverage at [email or web address], Office  [phone]   09/03/2020, 11:25 AM  LOS: 43 days

## 2020-09-03 NOTE — Progress Notes (Signed)
Nutrition Follow-up  DOCUMENTATION CODES:   Not applicable  INTERVENTION:   -D/c calorie count -Continue feeding assistance with meals -Continue Magic cup TID with meals, each supplement provides 290 kcal and 9 grams of protein -Nocturnal feedings:  Jevity 1.5 @ 70 ml/hr via PEG over 14 hour period (2000-1000)  45 ml Prosource TF TID  Continue 200 ml free water flush every 3 hours  Tube feeding regimen provides 1590 kcal (69% of needs),96 grams of protein, and 745 ml of H2O. Total free water: 2345 ml daily  NUTRITION DIAGNOSIS:   Inadequate oral intake related to inability to eat as evidenced by NPO status.  Progressing; advanced to dysphagia 2 diet on 08/29/20  GOAL:   Patient will meet greater than or equal to 90% of their needs  Progressing   MONITOR:   Diet advancement,Labs,Weight trends,TF tolerance,Skin,I & O's  REASON FOR ASSESSMENT:   Consult,Ventilator Enteral/tube feeding initiation and management  ASSESSMENT:   Patient with PMH significant for HTN and kidney stones. Presents this admission s/p cardiac arrest.  12/13- NGT d/c 12/15- cortrak placed, tip of tube in the stomach 12/17- s/p BSE- recommend continue NPO 12/19- pt pulled cortrak tube, replaced- placement verified by x-ray (stomach) 12/20- s/p BSE- pt refusing PO trials 12/30- cortrak removed, PEG placed 1/5- Advanced to thin liquid diet 1/6- Advanced to full liquid diet 1/11- Advanced to dysphagia 2 diet  1/14- transitioned to nocturnal feedings by MD  Reviewed I/O's: -370 ml x 24 hours and +10.3 L since 08/20/20  UOP: 1.2 L x 24 hours  09/01/20 Breakfast: 820 kcals, 3 grams protein Lunch: 0 Dinner: nothing documented  Total intake: 820 kcal (36% of minimum estimated needs)  3 grams protein (3% of minimum estimated needs)  09/02/20 Breakfast: 186 kcals, 8 grams protein Lunch: 72 kcals, 3 grams protein Dinner: 297 kcals, 16 grams protein  Total intake: 555 kcal (24% of  minimum estimated needs)  27 grams protein (23% of minimum estimated needs)  Average Total intake: 688 kcal (30% of minimum estimated needs)  15 grams protein (23% of minimum estimated needs)  Pt's intake is improving, however, still minimal. Will adjust nocturnal feeding regimen to help pt better meet his nutritional goals.    Labs reviewed: CBGS: 97-152 (inpatient orders for glycemic control are ).   Diet Order:   Diet Order            DIET DYS 2 Room service appropriate? No; Fluid consistency: Thin  Diet effective now                 EDUCATION NEEDS:   Not appropriate for education at this time  Skin:  Skin Assessment: Skin Integrity Issues: Skin Integrity Issues:: Other (Comment) Other: MASD to buttocks, skin tear to rt upper back  Last BM:  09/02/20  Height:   Ht Readings from Last 1 Encounters:  09/03/20 6' (1.829 m)    Weight:   Wt Readings from Last 1 Encounters:  09/03/20 86.5 kg    Ideal Body Weight:  80.9 kg  BMI:  Body mass index is 25.86 kg/m.  Estimated Nutritional Needs:   Kcal:  2300-2500 kcal  Protein:  115-130 grams  Fluid:  >/= 2 L/day    Loistine Chance, RD, LDN, [place] Registered Dietitian II Certified Diabetes Care and Education Specialist Please refer to [facility] for RD and/or RD on-call/weekend/after hours pager

## 2020-09-04 LAB — GLUCOSE, CAPILLARY
Glucose-Capillary: 100 mg/dL — ABNORMAL HIGH (ref 70–99)
Glucose-Capillary: 107 mg/dL — ABNORMAL HIGH (ref 70–99)
Glucose-Capillary: 112 mg/dL — ABNORMAL HIGH (ref 70–99)
Glucose-Capillary: 121 mg/dL — ABNORMAL HIGH (ref 70–99)
Glucose-Capillary: 150 mg/dL — ABNORMAL HIGH (ref 70–99)

## 2020-09-04 MED ORDER — QUETIAPINE FUMARATE 50 MG PO TABS
50.0000 mg | ORAL_TABLET | Freq: Every day | ORAL | Status: DC
  Administered 2020-09-04 – 2020-09-19 (×16): 50 mg
  Filled 2020-09-04 (×16): qty 1

## 2020-09-04 NOTE — Progress Notes (Addendum)
[facility] PROGRESS NOTE  CHARLESTON HANKIN YQM:[id number] DOB: [birth year] DOA: 07/22/2020 PCP: Patient, No Pcp Per       Status: Remains inpatient appropriate because:Altered mental status and Unsafe d/c plan   Dispo: The patient is from: Home              Anticipated d/c is to: SNF              Anticipated d/c date is: > 3 days              Patient currently is medically stable to d/c.  Barriers to discharge: Medicaid and disability pending.Family unable to provide adequate care 24/7 after dc so declined by CIR-focus is now on SNF   Code Status: Full Family Communication: 1/06 spoke with patient's sister [name] -/11 Sister also at bedside and was updated by case management staff DVT prophylaxis: sq heparin Vaccination status: Completed both doses of Pfizer Covid vaccination on 12/14/2019  Foley catheter: No  HPI: 60 year old male history of hypertension prescribed Norvasc prior to admission, chronic back pain, prior renal calculi who experienced an out of hospital cardiac arrest with downtime of 9 minutes.  Initial rhythm was ventricular fibrillation.  He was defibrillated deceived 6 doses of epi before ROSC.  Subsequent work-up revealed STEMI.  He underwent cardiac catheterization with a DES placed to the circumflex.  Cardiology following and has placed patient on Brilinta and aspirin and is also being treated with beta-blocker and statins.  From a neurological standpoint he clinically has anoxic brain injury.  During this hospitalization he experienced AKI and was treated with fluids and correction of hypoperfusion from cardiac arrest.  Echo performed this admission revealed ischemic cardiomyopathy with an EF of 40%  In addition to above patient has been unable to pass swallowing evaluations and subsequently has undergone percutaneous PEG tube placement on 12/30.  Patient is awake and moving all extremities spontaneously but does not follow commands and does not verbalize  palliative care has met with patient and family and has emphasized poor prognosis and will continue to meet with the family during the hospitalization.  Current plan is to transition to SNF bed once available.  Expect need for lifelong 24/7 care for all ADLs.  Subjective: Sleeping and did not awaken during exam.  Nursing states that he had been up most of the night and was having difficulty sleeping.  Objective: Vitals:   09/04/20 0052 09/04/20 0453  BP: (!) 133/97 (!) 130/95  Pulse: 78 91  Resp: 16   Temp: 97.7 F (36.5 C) 98.2 F (36.8 C)  SpO2: 99% 100%    Intake/Output Summary (Last 24 hours) at 09/04/2020 0800 Last data filed at 09/03/2020 2200 Gross per 24 hour  Intake 260 ml  Output 400 ml  Net -140 ml   Filed Weights   09/02/20 0414 09/03/20 0400 09/04/20 0453  Weight: 89.1 kg 86.5 kg 88.1 kg    Exam:  Constitutional: Sleeping and calm, does not appear to be in any acute distress Respiratory: Bilateral lung sounds clear to auscultation posteriorly, stable on room air Cardiovascular: Heart sounds S1-S2, no peripheral edema, extremities warm to touch, pulse regular nontachycardic Abdomen: Bowel sounds normoactive, nondistended and nontender, LBM 1/16 Neurologic: Sleeping but at baseline CN 2-12 roughly without any appreciable focal neurological deficits, MOE x4, strength is 4/5.   Psychiatric: Sleeping but at baseline awake, oriented to name only. Flat affect   Assessment/Plan: Acute problems: Out of hospital cardiac arrest secondary to STEMI -ROSC  and subsequent catheterization revealed disease in circumflex requiring DES -Continue beta-blocker, statin; Brilinta which had been held briefly for PEG tube resumed on 12/30 by cardiology -Denies chest pain  Ischemic cardiomyopathy -Echo this admission EF 40% -Continue pharmacotherapy as above -No ACE inhibitor secondary to underlying kidney disease -Continue hydralazine and Isordil -Remains euvolemic -Likely will  need follow-up echo 3 months post initial echo  Significant anoxic brain injury secondary to cardiac arrest/deconditioning/gait instability -Consistently participating with PT and OT.  SLP has cleared for dysphagia 2 with thin liquids -Awaiting confirmation from family that they can provide 24/7 care for several weeks after discharge from CIR -PT will make attempts to increase frequency of therapy including possible daily therapy alternating between PT and OT -No hypertonicity or spasticity observed -1/17 increase Seroquel to 50 mg at HS for insomnia and brain injury sequela -Continue daytime Symmetrel to help contain daytime alertness in context of brain injury -PT session 1/10: Patient interested in participating in trying to stand but cognitively was struggling with a series of events that must occur in order to stand reliably.  Hypertension -Was on Norvasc prior to admission -Continue cardiovascular meds as listed above  Nonsustained VT -Continue amiodarone and beta-blocker -LFTs were repeated on 1/7 with stable AST but ALT has increased from 52 to 133 -we will need to repeat again in 1 to 2 weeks to ensure stability -Cardiology managing  Acute on chronic kidney disease stage IIIb -Suspect prerenal etiology in the setting of hypoperfusion from ventricular fibrillation arrest -Baseline creatinine between 1.6 and 2; creatinine on 12/19 was 1.67-will repeat in a.m. on 08/19/2020 -Continue free water per tube and intermittently follow labs  Dysphagia, moderate protein calorie malnutrition Nutrition Problem: Inadequate oral intake Etiology: inability to eat Signs/Symptoms: NPO status  -patient now on D2 diet with thin liquids.  Not eating much secondary to continuous tube feedings and a sensation of feeling full and not being hungry.  Will feed for 12 hrs from 8 pm to 8 am at 65/hr (1/17 had been placed for this to begin on Friday 1/14 but nurse at bedside states she was unaware of order  but would stop tube feeding now and make sure following staff understand order and will resume tube feedings at 8 PM tonight) Interventions: Tube feeding,Prostat Estimated body mass index is 26.34 kg/m as calculated from the following:   Height as of this encounter: 6' (1.829 m).   Weight as of this encounter: 88.1 kg.  Skin tear right lateral back Wound / Incision (Open or Dehisced) 07/28/20 Skin tear Back Lateral;Right;Upper (Active)  Date First Assessed/Time First Assessed: 07/28/20 0300   Wound Type: Skin tear  Location: Back  Location Orientation: Lateral;Right;Upper  Present on Admission: No    Assessments 07/28/2020  7:49 PM 09/01/2020  9:10 PM  Dressing Type None Foam - Lift dressing to assess site every shift  Dressing Changed -- New  Dressing Status -- Clean;Dry;Intact  Dressing Change Frequency -- PRN  Site / Wound Assessment -- Clean;Dry  Peri-wound Assessment -- Intact  Drainage Amount None None  Non-staged Wound Description -- Not applicable  Treatment Cleansed Cleansed     No Linked orders to display    Other problems: Acute hypernatremia -Continue free water per tube  Hyperglycemia without an underlying diagnosis of diabetes mellitus -Likely related to acute stress -Recent issues with hypoglycemia so sliding scale insulin decreased to very sensitive and Levemir decreased from 30 twice daily to 15 twice daily as of 1/14 -Hemoglobin A1c 4.7  Acute hypoxemic respiratory failure -Resolved -Stable on room air with O2 sats between 97 and 99%   Data Reviewed: Basic Metabolic Panel: No results for input(s): NA, K, CL, CO2, GLUCOSE, BUN, CREATININE, CALCIUM, MG, PHOS in the last 168 hours. Liver Function Tests: No results for input(s): AST, ALT, ALKPHOS, BILITOT, PROT, ALBUMIN in the last 168 hours. No results for input(s): LIPASE, AMYLASE in the last 168 hours. No results for input(s): AMMONIA in the last 168 hours. CBC: No results for input(s): WBC, NEUTROABS,  HGB, HCT, MCV, PLT in the last 168 hours. Cardiac Enzymes: No results for input(s): CKTOTAL, CKMB, CKMBINDEX, TROPONINI in the last 168 hours. BNP (last 3 results) No results for input(s): BNP in the last 8760 hours.  ProBNP (last 3 results) No results for input(s): PROBNP in the last 8760 hours.  CBG: Recent Labs  Lab 09/03/20 1153 09/03/20 1554 09/03/20 1634 09/03/20 2152 09/04/20 0044  GLUCAP 148* 114* 110* 132* 121*    No results found for this or any previous visit (from the past 240 hour(s)).   Studies: No results found.  Scheduled Meds: . amantadine  100 mg Per Tube Daily  . aspirin  81 mg Per Tube Daily  . atorvastatin  80 mg Per Tube Daily  . bethanechol  10 mg Per Tube TID  . carvedilol  25 mg Per Tube BID WC  . chlorhexidine gluconate (MEDLINE KIT)  15 mL Mouth Rinse BID  . feeding supplement (PROSource TF)  45 mL Per Tube TID  . free water  200 mL Per Tube Q3H  . heparin  5,000 Units Subcutaneous Q8H  . insulin aspart  0-6 Units Subcutaneous Q4H  . insulin detemir  15 Units Subcutaneous BID  . isosorbide-hydrALAZINE  1 tablet Per Tube TID  . mouth rinse  15 mL Mouth Rinse BID  . polyvinyl alcohol  1 drop Both Eyes QID  . QUEtiapine  25 mg Per Tube QHS  . sodium chloride flush  3 mL Intravenous Q12H  . ticagrelor  90 mg Per Tube BID   Continuous Infusions: . sodium chloride 250 mL (08/17/20 0812)  . feeding supplement (JEVITY 1.5 CAL/FIBER) 1,000 mL (09/03/20 2230)    Active Problems:   Acute ST elevation myocardial infarction (STEMI) (HCC)   Cardiac arrest (HCC)   Elevated blood pressure reading with diagnosis of hypertension   Shock circulatory (HCC)   Encephalopathy   History of ETT   Cardiomyopathy, ischemic   Anoxic brain damage [facility])   Dysphagia   DNR (do not resuscitate) discussion   Gait instability   Consultants:  Cardiology  Interventional radiology  PCCM  Neurology  Procedures:  Cardiac catheterization 12/4  Continuous  EEG 12/4 and overnight EEG with video 12/5  Echocardiogram 12/5  EEG 12/9  Percutaneous PEG tube placement 12/30  Antibiotics: Anti-infectives (From admission, onward)   Start     Dose/Rate Route Frequency Ordered Stop   08/17/20 0000  ceFAZolin (ANCEF) IVPB 2g/100 mL premix        2 g 200 mL/hr over 30 Minutes Intravenous To Radiology 08/15/20 1346 08/17/20 1111   07/22/20 0830  cefTRIAXone (ROCEPHIN) 2 g in sodium chloride 0.9 % 100 mL IVPB        2 g 200 mL/hr over 30 Minutes Intravenous Every 24 hours 07/22/20 0720 07/26/20 0858       Time spent: 20 minutes    Erin Hearing ANP  [facility] 7 am - 330 pm/M-F for direct patient care and  secure chat Please refer to Amion for contact information 44 days

## 2020-09-04 NOTE — Progress Notes (Signed)
Physical Therapy Treatment Patient Details Name: Antonio Navarro MRN: [id number] DOB: April 20, 1961 Today's Date: 09/04/2020    History of Present Illness [name] with history of tobacco abuse, obesity, HTN who was admitted 07/22/20 post cardiac arrest with inferolateral STEMI. He was resuscitated in the field by EMS after being found to be in V fib. Approximately 30 minutes of CPR. He was intubated on arrival.  Pt with anoxic brain injury. PEG placed on 08/17/20    PT Comments    Pt continues to make steady progress towards his physical therapy goals. Requiring two person minimal assist for functional mobility. Ambulating 150 feet with handheld assist; requires cues for activity pacing as he is not able to self regulate. Able to participate in standing functional activity at sink of washing hands with good initiation; however, requiring cues for execution and redirecting attention to task. Continues to present as a high fall risk based on decreased gait speed and safety awareness. Continue to recommend SNF for ongoing Physical Therapy.       Follow Up Recommendations  SNF     Equipment Recommendations  None recommended by PT    Recommendations for Other Services       Precautions / Restrictions Precautions Precautions: Fall Restrictions Weight Bearing Restrictions: No    Mobility  Bed Mobility Overal bed mobility: Needs Assistance Bed Mobility: Supine to Sit;Sit to Supine     Supine to sit: Min assist Sit to supine: Min assist   General bed mobility comments: MinA for initiation and trunk to upright. Assist for LE negotiation back into bed  Transfers Overall transfer level: Needs assistance Equipment used: 2 person hand held assist Transfers: Sit to/from Stand;Stand Pivot Transfers Sit to Stand: Min assist;+2 safety/equipment Stand pivot transfers: Min assist;+2 safety/equipment       General transfer comment: MinA (+2 safety) to rise from edge of bed and  recliner  Ambulation/Gait Ambulation/Gait assistance: Min assist;+2 physical assistance Gait Distance (Feet): 150 Feet Assistive device: 2 person hand held assist Gait Pattern/deviations: Step-through pattern;Decreased stride length;Ataxic;Narrow base of support;Trunk flexed Gait velocity: decreased Gait velocity interpretation: <1.8 ft/sec, indicate of risk for recurrent falls General Gait Details: Cues for upper trunk control and controlling momentum, minA + 2 for stability, occasional scissoring and decreased gait speed with fatigue.   Stairs             Wheelchair Mobility    Modified Rankin (Stroke Patients Only)       Balance Overall balance assessment: Needs assistance Sitting-balance support: No upper extremity supported;Feet unsupported Sitting balance-Leahy Scale: Fair     Standing balance support: Single extremity supported;During functional activity Standing balance-Leahy Scale: Poor Standing balance comment: requiring external support                            Cognition Arousal/Alertness: Awake/alert Behavior During Therapy: Flat affect Overall Cognitive Status: Impaired/Different from baseline Area of Impairment: Problem solving;Awareness;Safety/judgement;Following commands;Memory;Attention;Orientation                 Orientation Level: Disoriented to;Place;Time;Situation Current Attention Level: Selective Memory: Decreased recall of precautions;Decreased short-term memory Following Commands: Follows one step commands inconsistently Safety/Judgement: Decreased awareness of safety;Decreased awareness of deficits Awareness: Intellectual Problem Solving: Slow processing;Difficulty sequencing;Requires verbal cues;Requires tactile cues        Exercises      General Comments        Pertinent Vitals/Pain Pain Assessment: Faces Faces Pain Scale: No  hurt    Home Living                      Prior Function             PT Goals (current goals can now be found in the care plan section) Acute Rehab PT Goals Patient Stated Goal: family asking to get stronger Potential to Achieve Goals: Fair Progress towards PT goals: Progressing toward goals    Frequency    Min 3X/week      PT Plan Current plan remains appropriate    Co-evaluation              AM-PAC PT "6 Clicks" Mobility   Outcome Measure  Help needed turning from your back to your side while in a flat bed without using bedrails?: A Little Help needed moving from lying on your back to sitting on the side of a flat bed without using bedrails?: A Little Help needed moving to and from a bed to a chair (including a wheelchair)?: A Little Help needed standing up from a chair using your arms (e.g., wheelchair or bedside chair)?: A Little Help needed to walk in hospital room?: A Little Help needed climbing 3-5 steps with a railing? : A Lot 6 Click Score: 17    End of Session Equipment Utilized During Treatment: Gait belt Activity Tolerance: Patient tolerated treatment well Patient left: in bed;with call bell/phone within reach;with bed alarm set;with nursing/sitter in room;with family/visitor present Nurse Communication: Mobility status PT Visit Diagnosis: Other abnormalities of gait and mobility (R26.89)     Time: 3419-3790 PT Time Calculation (min) (ACUTE ONLY): 28 min  Charges:  $Gait Training: 8-22 mins $Therapeutic Activity: 8-22 mins                     Wyona Almas, PT, DPT Acute Rehabilitation Services Pager [phone] Office [phone]    Deno Etienne 09/04/2020, 3:26 PM

## 2020-09-04 NOTE — Progress Notes (Signed)
  Speech Language Pathology Treatment: Dysphagia;Cognitive-Linquistic  Patient Details Name: Antonio Navarro MRN: [id number] DOB: Nov 19, 1960 Today's Date: 09/04/2020 Time: 2355-7322 SLP Time Calculation (min) (ACUTE ONLY): 11 min  Assessment / Plan / Recommendation Clinical Impression  Pt has prolonged mastication with more solid textures, needing liquid washes and Min cues to clear his oral cavity. There are no overt s/s of aspiration, but pt also does not want a lot of intake (note that his TFs are still running). Pt was also given Max cues to increase verbal expression throughout session today, producing up to only a few words at a time. He will benefit from ongoing SLP services for swallowing and cognitive-linguistic goals.    HPI HPI: Pt is a 60 yo male s/p arrest with downtime 9 minutes. Initial rhythm was V. fib, shocked and received 6 rounds of epi before ROSC achieved.  Total CPR time was 30 minutes. ETT 12/4-12/13. MRI 12/7 suggestive of a widespread anoxic injury. PMH includes kidney stones and HTN.      SLP Plan  Continue with current plan of care       Recommendations  Diet recommendations: Dysphagia 2 (fine chop);Thin liquid Liquids provided via: Cup;Straw Medication Administration: Via alternative means Supervision: Staff to assist with self feeding;Full supervision/cueing for compensatory strategies Compensations: Minimize environmental distractions;Slow rate;Small sips/bites Postural Changes and/or Swallow Maneuvers: Seated upright 90 degrees                Oral Care Recommendations: Oral care BID Follow up Recommendations: Skilled Nursing facility SLP Visit Diagnosis: Dysphagia, oral phase (R13.11) Plan: Continue with current plan of care       GO                Osie Bond., M.A. [place] Acute Rehabilitation Services Pager [phone] Office [phone]  09/04/2020, 12:30 PM

## 2020-09-05 DIAGNOSIS — R1312 Dysphagia, oropharyngeal phase: Secondary | ICD-10-CM

## 2020-09-05 DIAGNOSIS — R2681 Unsteadiness on feet: Secondary | ICD-10-CM

## 2020-09-05 LAB — GLUCOSE, CAPILLARY
Glucose-Capillary: 106 mg/dL — ABNORMAL HIGH (ref 70–99)
Glucose-Capillary: 114 mg/dL — ABNORMAL HIGH (ref 70–99)

## 2020-09-05 MED ORDER — FREE WATER
200.0000 mL | Status: DC
  Administered 2020-09-05 – 2020-10-18 (×465): 200 mL

## 2020-09-05 NOTE — Progress Notes (Signed)
Occupational Therapy Treatment Patient Details Name: Antonio Navarro MRN: [id number] DOB: 25-Sep-1960 Today's Date: 09/05/2020    History of present illness [name] with history of tobacco abuse, obesity, HTN who was admitted 07/22/20 post cardiac arrest with inferolateral STEMI. He was resuscitated in the field by EMS after being found to be in V fib. Approximately 30 minutes of CPR. He was intubated on arrival.  Pt with anoxic brain injury. PEG placed on 08/17/20   OT comments  Pt making gradual progress towards OT goals this session. Pt with decreased ability to follow commands this session and resistant to mobility as pt initially able to sit EOB with MOD A +2 but returns self to supine with supervision. Noted CIR signed off as family can not provide 24/7 assist at home, updated DC recs back to SNF, will let OTR know about change in POC. Will continue to follow acutely per POC.    Follow Up Recommendations  SNF;Supervision/Assistance - 24 hour    Equipment Recommendations  Wheelchair (measurements OT);Wheelchair cushion (measurements OT);Hospital bed;3 in 1 bedside commode    Recommendations for Other Services      Precautions / Restrictions Precautions Precautions: Fall Precaution Comments: mitts, gtube, posey alarm belt in chair Restrictions Weight Bearing Restrictions: No       Mobility Bed Mobility Overal bed mobility: Needs Assistance Bed Mobility: Supine to Sit     Supine to sit: Mod assist;+2 for physical assistance Sit to supine: Supervision   General bed mobility comments: pt requires MOD A +2 to transition from supine>sitting needing cues to sequence each step however once pts legs EOB and trunk elevated pt returned self to supine with supervision. MAX A to shift pts trunk forward from long sitting in bed to adjsut bed pads, pt much more resistant to movement this session  Transfers                 General transfer comment: unable    Balance  Overall balance assessment: Needs assistance Sitting-balance support: No upper extremity supported;Feet unsupported Sitting balance-Leahy Scale: Fair                                     ADL either performed or assessed with clinical judgement   ADL       Grooming: Wash/dry face;Sitting;Total assistance Grooming Details (indicate cue type and reason): pt unable to follow commands needing total A to wash face                             Functional mobility during ADLs: Moderate assistance;+2 for physical assistance;Cueing for safety;Cueing for sequencing (bed mobility only) General ADL Comments: pt not following commands as well this session , very resistant to movement noted to return self back to supine once sitting EOB     Vision       Perception     Praxis      Cognition Arousal/Alertness: Awake/alert Behavior During Therapy: Flat affect Overall Cognitive Status: Impaired/Different from baseline Area of Impairment: Problem solving;Awareness;Safety/judgement;Following commands;Memory;Attention                   Current Attention Level: Selective Memory: Decreased recall of precautions;Decreased short-term memory Following Commands: Follows one step commands inconsistently Safety/Judgement: Decreased awareness of safety;Decreased awareness of deficits Awareness: Intellectual Problem Solving: Slow processing;Difficulty sequencing;Requires verbal cues;Requires tactile cues;Decreased initiation  General Comments: pt not following commands as well this session, pt does state "yes" when asked if he is frustrated. noted to lay self back down once sitting EOB        Exercises     Shoulder Instructions       General Comments VSS    Pertinent Vitals/ Pain       Pain Assessment: Faces Faces Pain Scale: No hurt  Home Living                                          Prior Functioning/Environment               Frequency  Min 2X/week        Progress Toward Goals  OT Goals(current goals can now be found in the care plan section)  Progress towards OT goals: Progressing toward goals  Acute Rehab OT Goals Patient Stated Goal: family asking to get stronger OT Goal Formulation: With family Time For Goal Achievement: 09/13/20 Potential to Achieve Goals: Home Discharge plan needs to be updated;Frequency needs to be updated    Co-evaluation                 AM-PAC OT "6 Clicks" Daily Activity     Outcome Measure   Help from another person eating meals?: A Lot Help from another person taking care of personal grooming?: A Lot Help from another person toileting, which includes using toliet, bedpan, or urinal?: Total Help from another person bathing (including washing, rinsing, drying)?: A Lot Help from another person to put on and taking off regular upper body clothing?: A Lot Help from another person to put on and taking off regular lower body clothing?: Total 6 Click Score: 10    End of Session Equipment Utilized During Treatment: Gait belt  OT Visit Diagnosis: Muscle weakness (generalized) (M62.81);Pain;Other abnormalities of gait and mobility (R26.89);Cognitive communication deficit (R41.841);Low vision, both eyes (H54.2) Symptoms and signs involving cognitive functions: Other Nontraumatic ICH   Activity Tolerance Other (comment) (not following commands resistant to movement)   Patient Left in bed;with call bell/phone within reach;with bed alarm set;Other (comment) (mitts applied)   Nurse Communication Mobility status        Time: 1209-1227 OT Time Calculation (min): 18 min  Charges: OT General Charges $OT Visit: 1 Visit OT Treatments $Therapeutic Activity: 8-22 mins  [name]., COTA/L Acute Rehabilitation Services [phone]    Precious Haws 09/05/2020, 2:38 PM

## 2020-09-05 NOTE — TOC Progression Note (Signed)
Transition of Care [facility]) - Progression Note    Patient Details  Name: [name] MRN: [id number] Date of Birth: January 20, 1961  Transition of Care [facility]) CM/SW [place], RN Phone Number: 09/05/2020, 12:50 PM  Clinical Narrative:    Case management called and left a message with Stephens November, CM with [place] to inquire about bed availability for the patient for admission.  Internal comment left for CM noting that [facility] will willing to offer LOG until Medicaid application is approved.   Expected Discharge Plan: Skilled Nursing Facility Barriers to Discharge: Continued Medical Work up,No SNF bed  Expected Discharge Plan and Services Expected Discharge Plan: [place] arrangements for the past 2 months: Single Family Home                                       Social Determinants of Health (SDOH) Interventions    Readmission Risk Interventions No flowsheet data found.

## 2020-09-05 NOTE — Progress Notes (Addendum)
[facility] PROGRESS NOTE  Antonio Navarro DDU:[id number] DOB: 04-15-1961 DOA: 07/22/2020 PCP: Patient, No Pcp Per       Status: Remains inpatient appropriate because:Altered mental status and Unsafe d/c plan   Dispo: The patient is from: Home              Anticipated d/c is to: SNF              Anticipated d/c date is: > 3 days              Patient currently is medically stable to d/c.  Barriers to discharge: Medicaid and disability pending.Family unable to provide adequate care 24/7 after dc so declined by CIR-focus is now on SNF   Code Status: Full Family Communication: 1/06 spoke with patient's sister Antonio Navarro -1/18  DVT prophylaxis: sq heparin Vaccination status: Completed both doses of Pfizer Covid vaccination on 12/14/2019  Foley catheter: No  HPI: 60 year old male history of hypertension prescribed Norvasc prior to admission, chronic back pain, prior renal calculi who experienced an out of hospital cardiac arrest with downtime of 9 minutes.  Initial rhythm was ventricular fibrillation.  He was defibrillated deceived 6 doses of epi before ROSC.  Subsequent work-up revealed STEMI.  He underwent cardiac catheterization with a DES placed to the circumflex.  Cardiology following and has placed patient on Brilinta and aspirin and is also being treated with beta-blocker and statins.  From a neurological standpoint he clinically has anoxic brain injury.  During this hospitalization he experienced AKI and was treated with fluids and correction of hypoperfusion from cardiac arrest.  Echo performed this admission revealed ischemic cardiomyopathy with an EF of 40%  In addition to above patient has been unable to pass swallowing evaluations and subsequently has undergone percutaneous PEG tube placement on 12/30.  Patient is awake and moving all extremities spontaneously but does not follow commands and does not verbalize palliative care has met with patient and family and has emphasized  poor prognosis and will continue to meet with the family during the hospitalization.  Current plan is to transition to SNF bed once available.  Expect need for lifelong 24/7 care for all ADLs.  Subjective: Awakened.  No acute distress.  Went back to sleep.  Review of nursing notes patient had been awake during the night and became agitated with attempts to check CBGs.  Objective: Vitals:   09/04/20 2100 09/05/20 0548  BP: 132/89 90/66  Pulse: 82 83  Resp:  18  Temp: 97.8 F (36.6 C) 98.5 F (36.9 C)  SpO2: 99% 99%    Intake/Output Summary (Last 24 hours) at 09/05/2020 0659 Last data filed at 09/05/2020 0500 Gross per 24 hour  Intake 60 ml  Output 550 ml  Net -490 ml   Filed Weights   09/03/20 0400 09/04/20 0453 09/05/20 0500  Weight: 86.5 kg 88.1 kg 86.5 kg    Exam: Constitutional: Sleeping but awakened, was calm, no acute distress Respiratory: Normal respiratory effort, stable on room air, lung sounds clear Cardiovascular: Pulses regular, extremities are warm with adequate capillary refill, normal heart sounds Genitourinary: Condom catheter in place with amber-colored urine draining to bedside bag Abdomen: Bowel sounds normoactive, nondistended and nontender, LBM 1/18 Neurologic: Sleeping but at baseline CN 2-12 roughly without any appreciable focal neurological deficits, MOE x4, strength is 4/5.   Psychiatric: Awakened, oriented to name only.  Flat affect   Assessment/Plan: Acute problems: Out of hospital cardiac arrest secondary to STEMI -ROSC and subsequent catheterization revealed  disease in circumflex requiring DES -Continue beta-blocker, statin; Brilinta which had been held briefly for PEG tube resumed on 12/30 by cardiology -Denies chest pain  Hyperglycemia without an underlying diagnosis of diabetes mellitus -Likely related to acute stress -Recent issues with hypoglycemia so sliding scale insulin decreased to very sensitive and Levemir decreased from 30 twice  daily to 15 twice daily as of 1/14 -Hemoglobin A1c 4.7  -1/18 refusing CBG checks so TF held-will stop TF and dc Levemir and SSI. If TF resumed can resume SSI and CBG checks  Ischemic cardiomyopathy -Echo this admission EF 40% -Continue pharmacotherapy as above -No ACE inhibitor secondary to underlying kidney disease -Continue hydralazine and Isordil -Will need follow-up echo 3 months post initial echo  Significant anoxic brain injury secondary to cardiac arrest/deconditioning/gait instability -Consistently participating with PT and OT.  SLP has cleared for dysphagia 2 with thin liquids -Awaiting confirmation from family that they can provide 24/7 care for several weeks after discharge from CIR -PT will make attempts to increase frequency of therapy including possible daily therapy alternating between PT and OT -No hypertonicity or spasticity observed -1/17 increase Seroquel to 50 mg at HS for insomnia and brain injury sequela -Continue daytime Symmetrel to help contain daytime alertness in context of brain injury -PT session 1/10: Patient interested in participating in trying to stand but cognitively was struggling with a series of events that must occur in order to stand reliably.  Hypertension -Was on Norvasc prior to admission -Continue cardiovascular meds as listed above  Nonsustained VT -Continue amiodarone and beta-blocker -LFTs were repeated on 1/7 with stable AST but ALT has increased from 52 to 133 -we will need to repeat again in 1 to 2 weeks to ensure stability -Cardiology managing  Acute on chronic kidney disease stage IIIb -Suspect prerenal etiology in the setting of hypoperfusion from ventricular fibrillation arrest -Baseline creatinine between 1.6 and 2; creatinine on 12/19 was 1.67-will repeat in a.m. on 08/19/2020 -Continue free water per tube and intermittently follow labs  Dysphagia, moderate protein calorie malnutrition Nutrition Problem: Inadequate oral  intake Etiology: inability to eat Signs/Symptoms: NPO status  -patient now on D2 diet with thin liquids. DC tube feedings and monitor PO intake-can resume if consistently <50% -1/18 urine dark in appearance.  Possibly dehydration.  Will check UA to rule out hematuria but will increase free water to 200 cc every 2 hours-did not receive any tube feeding and likely no free water overnight given nurses concern over administering tube feeding well patient agitated Interventions: Tube feeding,Prostat Estimated body mass index is 25.86 kg/m as calculated from the following:   Height as of this encounter: 6' (1.829 m).   Weight as of this encounter: 86.5 kg.  Skin tear right lateral back Wound / Incision (Open or Dehisced) 07/28/20 Skin tear Back Lateral;Right;Upper (Active)  Date First Assessed/Time First Assessed: 07/28/20 0300   Wound Type: Skin tear  Location: Back  Location Orientation: Lateral;Right;Upper  Present on Admission: No    Assessments 07/28/2020  7:49 PM 09/03/2020  9:00 PM  Dressing Type None Other (Comment)  Dressing Status -- None  Drainage Amount None --  Treatment Cleansed --     No Linked orders to display    Other problems: Acute hypernatremia -Continue free water per tube  Acute hypoxemic respiratory failure -Resolved -Stable on room air with O2 sats between 97 and 99%   Data Reviewed: Basic Metabolic Panel: No results for input(s): NA, K, CL, CO2, GLUCOSE, BUN, CREATININE, CALCIUM, MG,  PHOS in the last 168 hours. Liver Function Tests: No results for input(s): AST, ALT, ALKPHOS, BILITOT, PROT, ALBUMIN in the last 168 hours. No results for input(s): LIPASE, AMYLASE in the last 168 hours. No results for input(s): AMMONIA in the last 168 hours. CBC: No results for input(s): WBC, NEUTROABS, HGB, HCT, MCV, PLT in the last 168 hours. Cardiac Enzymes: No results for input(s): CKTOTAL, CKMB, CKMBINDEX, TROPONINI in the last 168 hours. BNP (last 3 results) No  results for input(s): BNP in the last 8760 hours.  ProBNP (last 3 results) No results for input(s): PROBNP in the last 8760 hours.  CBG: Recent Labs  Lab 09/04/20 1302 09/04/20 1659 09/04/20 2117 09/05/20 0004 09/05/20 0543  GLUCAP 112* 100* 107* 114* 106*    No results found for this or any previous visit (from the past 240 hour(s)).   Studies: No results found.  Scheduled Meds: . amantadine  100 mg Per Tube Daily  . aspirin  81 mg Per Tube Daily  . atorvastatin  80 mg Per Tube Daily  . bethanechol  10 mg Per Tube TID  . carvedilol  25 mg Per Tube BID WC  . chlorhexidine gluconate (MEDLINE KIT)  15 mL Mouth Rinse BID  . feeding supplement (PROSource TF)  45 mL Per Tube TID  . free water  200 mL Per Tube Q3H  . heparin  5,000 Units Subcutaneous Q8H  . isosorbide-hydrALAZINE  1 tablet Per Tube TID  . mouth rinse  15 mL Mouth Rinse BID  . polyvinyl alcohol  1 drop Both Eyes QID  . QUEtiapine  50 mg Per Tube QHS  . sodium chloride flush  3 mL Intravenous Q12H  . ticagrelor  90 mg Per Tube BID   Continuous Infusions: . sodium chloride 250 mL (08/17/20 1610)    Active Problems:   Acute ST elevation myocardial infarction (STEMI) [facility])   Cardiac arrest (HCC)   Elevated blood pressure reading with diagnosis of hypertension   Shock circulatory (HCC)   Encephalopathy   History of ETT   Cardiomyopathy, ischemic   Anoxic brain damage [facility])   Dysphagia   DNR (do not resuscitate) discussion   Gait instability   Consultants:  Cardiology  Interventional radiology  PCCM  Neurology  Procedures:  Cardiac catheterization 12/4  Continuous EEG 12/4 and overnight EEG with video 12/5  Echocardiogram 12/5  EEG 12/9  Percutaneous PEG tube placement 12/30  Antibiotics: Anti-infectives (From admission, onward)   Start     Dose/Rate Route Frequency Ordered Stop   08/17/20 0000  ceFAZolin (ANCEF) IVPB 2g/100 mL premix        2 g 200 mL/hr over 30 Minutes  Intravenous To Radiology 08/15/20 1346 08/17/20 1111   07/22/20 0830  cefTRIAXone (ROCEPHIN) 2 g in sodium chloride 0.9 % 100 mL IVPB        2 g 200 mL/hr over 30 Minutes Intravenous Every 24 hours 07/22/20 0720 07/26/20 0858       Time spent: 20 minutes    Erin Hearing ANP  [facility] 7 am - 330 pm/M-F for direct patient care and secure chat Please refer to Amion for contact information 45 days

## 2020-09-05 NOTE — Progress Notes (Signed)
Patient uncooperative and fidgeting /restless in bed overnight including pulling his hands away when checking blood sugars, tensing up/pulling away during peri care, and pushing back during linen changes.   Do not feel it safe to initiate overnight tube feeding at this time.  Will reapproach and reattempt overnight.

## 2020-09-06 LAB — URINALYSIS, ROUTINE W REFLEX MICROSCOPIC
Bilirubin Urine: NEGATIVE
Glucose, UA: NEGATIVE mg/dL
Ketones, ur: NEGATIVE mg/dL
Leukocytes,Ua: NEGATIVE
Nitrite: POSITIVE — AB
Protein, ur: 30 mg/dL — AB
Specific Gravity, Urine: 1.025 (ref 1.005–1.030)
pH: 5.5 (ref 5.0–8.0)

## 2020-09-06 LAB — BASIC METABOLIC PANEL
Anion gap: 12 (ref 5–15)
BUN: 24 mg/dL — ABNORMAL HIGH (ref 6–20)
CO2: 23 mmol/L (ref 22–32)
Calcium: 9.5 mg/dL (ref 8.9–10.3)
Chloride: 105 mmol/L (ref 98–111)
Creatinine, Ser: 1.4 mg/dL — ABNORMAL HIGH (ref 0.61–1.24)
GFR, Estimated: 58 mL/min — ABNORMAL LOW (ref >60)
Glucose, Bld: 103 mg/dL — ABNORMAL HIGH (ref 70–99)
Potassium: 3.7 mmol/L (ref 3.5–5.1)
Sodium: 140 mmol/L (ref 135–145)

## 2020-09-06 LAB — URINALYSIS, MICROSCOPIC (REFLEX): RBC / HPF: >50 RBC/hpf (ref 0–5)

## 2020-09-06 NOTE — Progress Notes (Signed)
[facility] PROGRESS NOTE  DUKE WEISENSEL EYE:[id number] DOB: [birth year] DOA: 07/22/2020 PCP: Patient, No Pcp Per       Status: Remains inpatient appropriate because:Altered mental status and Unsafe d/c plan   Dispo: The patient is from: Home              Anticipated d/c is to: SNF              Anticipated d/c date is: > 3 days              Patient currently is medically stable to d/c.  Barriers to discharge: Medicaid and disability pending.Family unable to provide adequate care 24/7 after dc so declined by CIR-focus is now on SNF   Code Status: Full Family Communication: 1/06 spoke with patient's sister [name] -1/18  DVT prophylaxis: sq heparin Vaccination status: Completed both doses of Pfizer Covid vaccination on 12/14/2019  Foley catheter: No  HPI: 60 year old male history of hypertension prescribed Norvasc prior to admission, chronic back pain, prior renal calculi who experienced an out of hospital cardiac arrest with downtime of 9 minutes.  Initial rhythm was ventricular fibrillation.  He was defibrillated deceived 6 doses of epi before ROSC.  Subsequent work-up revealed STEMI.  He underwent cardiac catheterization with a DES placed to the circumflex.  Cardiology following and has placed patient on Brilinta and aspirin and is also being treated with beta-blocker and statins.  From a neurological standpoint he clinically has anoxic brain injury.  During this hospitalization he experienced AKI and was treated with fluids and correction of hypoperfusion from cardiac arrest.  Echo performed this admission revealed ischemic cardiomyopathy with an EF of 40%  In addition to above patient has been unable to pass swallowing evaluations and subsequently has undergone percutaneous PEG tube placement on 12/30.  Patient is awake and moving all extremities spontaneously but does not follow commands and does not verbalize palliative care has met with patient and family and has emphasized  poor prognosis and will continue to meet with the family during the hospitalization.  Current plan is to transition to SNF bed once available.  Expect need for lifelong 24/7 care for all ADLs.  Subjective: Patient awake.  Initially with flat affect but upon further interaction with patient made more consistent eye contact and began smiling.  No specific complaints when asked.  Objective: Vitals:   09/05/20 2116 09/06/20 0400  BP: 120/80 128/90  Pulse: 88 80  Resp: 18   Temp: 98.3 F (36.8 C) 98.2 F (36.8 C)  SpO2: 97% 99%    Intake/Output Summary (Last 24 hours) at 09/06/2020 0800 Last data filed at 09/06/2020 0601 Gross per 24 hour  Intake 3760 ml  Output 650 ml  Net 3110 ml   Filed Weights   09/05/20 0500 09/06/20 0010 09/06/20 0500  Weight: 86.5 kg 87.6 kg 87.6 kg    Exam: Constitutional: Alert, no acute distress, calm Cardiovascular: Normal heart sounds, pulse regular, extremities warm and dry with appropriate capillary refill Abdomen: Soft, nontender nondistended.  In the past 48 hours patient has eaten 90 to 100% of meals offered by mouth PEG tube clamped. LBM 1/18 Genitourinary: Condom catheter in place, urine remains slightly dark in appearance but no longer amber-colored Neurologic: CN 2-12 roughly without any appreciable focal neurological deficits, MOE x4, strength is 4/5.   Psychiatric: Awake, oriented to name only.  Flat affect -continues with significant short-term memory deficit   Assessment/Plan: Acute problems: Out of hospital cardiac arrest secondary to  STEMI -ROSC and subsequent catheterization revealed disease in circumflex requiring DES -Continue beta-blocker, statin; Brilinta which had been held briefly for PEG tube resumed on 12/30 by cardiology -Denies chest pain  Abnormal urinalysis -Obtained on 1/19.  Urine cloudy in appearance with large amount of hemoglobin, positive nitrate, 30 of protein and few bacteria with 0-5 WBC -Appears to be  consistent with dehydration-as noted Free water has been increased as of 1/18 and patient also encouraged to increase oral intake of fluids today -As a precaution we will obtain urine culture and only administer antibiotics if urine culture abnormal.  Currently is asymptomatic with no reports of abdominal discomfort or fever.  Ischemic cardiomyopathy -Echo this admission EF 40% -Continue pharmacotherapy as above -No ACE inhibitor secondary to underlying kidney disease -Continue hydralazine and Isordil -Will need follow-up echo 3 months post initial echo  Significant anoxic brain injury secondary to cardiac arrest/deconditioning/gait instability -Consistently participating with PT and OT.  SLP has cleared for dysphagia 2 with thin liquids -Awaiting confirmation from family that they can provide 24/7 care for several weeks after discharge from CIR -PT will make attempts to increase frequency of therapy including possible daily therapy alternating between PT and OT -No hypertonicity or spasticity observed -1/17 increase Seroquel to 50 mg at HS for insomnia and brain injury sequela -Continue daytime Symmetrel to help contain daytime alertness in context of brain injury -PT session 1/10: Patient interested in participating in trying to stand but cognitively was struggling with a series of events that must occur in order to stand reliably.  Hypertension -Was on Norvasc prior to admission -Continue cardiovascular meds as listed above  Nonsustained VT -Continue amiodarone and beta-blocker -LFTs were repeated on 1/7 with stable AST but ALT has increased from 52 to 133 -1/19 will repeat in a.m. -Cardiology managing  Acute on chronic kidney disease stage IIIb -Suspect prerenal etiology in the setting of hypoperfusion from ventricular fibrillation arrest -Baseline creatinine between 1.6 and 2; creatinine on 12/19 was 1.67-will repeat in a.m. on 08/19/2020 -Continue free water per tube and  intermittently follow labs  Dysphagia, moderate protein calorie malnutrition Nutrition Problem: Inadequate oral intake Etiology: inability to eat Signs/Symptoms: NPO status  -patient now on D2 diet with thin liquids. DC tube feedings and monitor PO intake-can resume if consistently <50% -1/18 urine dark in appearance.  Possibly dehydration.  Will check UA to rule out hematuria but will increase free water to 200 cc every 2 hours-did not receive any tube feeding and likely no free water overnight given nurses concern over administering tube feeding well patient agitated Interventions: Tube feeding,Prostat Estimated body mass index is 26.19 kg/m as calculated from the following:   Height as of this encounter: 6' (1.829 m).   Weight as of this encounter: 87.6 kg.  Skin tear right lateral back Wound / Incision (Open or Dehisced) 07/28/20 Skin tear Back Lateral;Right;Upper (Active)  Date First Assessed/Time First Assessed: 07/28/20 0300   Wound Type: Skin tear  Location: Back  Location Orientation: Lateral;Right;Upper  Present on Admission: No    Assessments 07/28/2020  7:49 PM 09/03/2020  9:00 PM  Dressing Type None Other (Comment)  Dressing Status - None  Drainage Amount None -  Treatment Cleansed -     No Linked orders to display    Other problems: Acute hypernatremia -Continue free water per tube  Hyperglycemia without an underlying diagnosis of diabetes mellitus Resolved -Likely related to acute stress -Recent issues with hypoglycemia so sliding scale insulin decreased to very  sensitive and Levemir decreased from 30 twice daily to 15 twice daily as of 1/14 -Hemoglobin A1c 4.7  -1/18 refusing CBG checks so TF held-will stop TF and dc Levemir and SSI. If TF resumed can resume SSI and CBG checks -Serum glucose on 1/19 was 103  Acute hypoxemic respiratory failure -Resolved -Stable on room air with O2 sats between 97 and 99%   Data Reviewed: Basic Metabolic Panel: Recent Labs   Lab 09/06/20 0506  NA 140  K 3.7  CL 105  CO2 23  GLUCOSE 103*  BUN 24*  CREATININE 1.40*  CALCIUM 9.5   Liver Function Tests: No results for input(s): AST, ALT, ALKPHOS, BILITOT, PROT, ALBUMIN in the last 168 hours. No results for input(s): LIPASE, AMYLASE in the last 168 hours. No results for input(s): AMMONIA in the last 168 hours. CBC: No results for input(s): WBC, NEUTROABS, HGB, HCT, MCV, PLT in the last 168 hours. Cardiac Enzymes: No results for input(s): CKTOTAL, CKMB, CKMBINDEX, TROPONINI in the last 168 hours. BNP (last 3 results) No results for input(s): BNP in the last 8760 hours.  ProBNP (last 3 results) No results for input(s): PROBNP in the last 8760 hours.  CBG: Recent Labs  Lab 09/04/20 1302 09/04/20 1659 09/04/20 2117 09/05/20 0004 09/05/20 0543  GLUCAP 112* 100* 107* 114* 106*    No results found for this or any previous visit (from the past 240 hour(s)).   Studies: No results found.  Scheduled Meds: . amantadine  100 mg Per Tube Daily  . aspirin  81 mg Per Tube Daily  . atorvastatin  80 mg Per Tube Daily  . bethanechol  10 mg Per Tube TID  . carvedilol  25 mg Per Tube BID WC  . chlorhexidine gluconate (MEDLINE KIT)  15 mL Mouth Rinse BID  . feeding supplement (PROSource TF)  45 mL Per Tube TID  . free water  200 mL Per Tube Q2H  . heparin  5,000 Units Subcutaneous Q8H  . isosorbide-hydrALAZINE  1 tablet Per Tube TID  . mouth rinse  15 mL Mouth Rinse BID  . polyvinyl alcohol  1 drop Both Eyes QID  . QUEtiapine  50 mg Per Tube QHS  . sodium chloride flush  3 mL Intravenous Q12H  . ticagrelor  90 mg Per Tube BID   Continuous Infusions: . sodium chloride 250 mL (08/17/20 8115)    Active Problems:   Acute ST elevation myocardial infarction (STEMI) [facility])   Cardiac arrest (HCC)   Elevated blood pressure reading with diagnosis of hypertension   Shock circulatory (HCC)   Encephalopathy   History of ETT   Cardiomyopathy, ischemic    Anoxic brain damage [facility])   Dysphagia   DNR (do not resuscitate) discussion   Gait instability   Consultants:  Cardiology  Interventional radiology  PCCM  Neurology  Procedures:  Cardiac catheterization 12/4  Continuous EEG 12/4 and overnight EEG with video 12/5  Echocardiogram 12/5  EEG 12/9  Percutaneous PEG tube placement 12/30  Antibiotics: Anti-infectives (From admission, onward)   Start     Dose/Rate Route Frequency Ordered Stop   08/17/20 0000  ceFAZolin (ANCEF) IVPB 2g/100 mL premix        2 g 200 mL/hr over 30 Minutes Intravenous To Radiology 08/15/20 1346 08/17/20 1111   07/22/20 0830  cefTRIAXone (ROCEPHIN) 2 g in sodium chloride 0.9 % 100 mL IVPB        2 g 200 mL/hr over 30 Minutes Intravenous Every 24 hours  07/22/20 0720 07/26/20 0858       Time spent: 20 minutes    Erin Hearing ANP  [facility] 7 am - 330 pm/M-F for direct patient care and secure chat Please refer to Amion for contact information 46 days

## 2020-09-06 NOTE — Progress Notes (Signed)
Physical Therapy Treatment Patient Details Name: Antonio Navarro MRN: [id number] DOB: 31-Jul-1961 Today's Date: 09/06/2020    History of Present Illness [name] with history of tobacco abuse, obesity, HTN who was admitted 07/22/20 post cardiac arrest with inferolateral STEMI. He was resuscitated in the field by EMS after being found to be in V fib. Approximately 30 minutes of CPR. He was intubated on arrival.  Pt with anoxic brain injury. PEG placed on 08/17/20    PT Comments    Pt seemingly internally distracted today, although could not verbalize. Pt denied pain; he was able to have a bowel movement when placed on bedside commode. Requiring two person moderate assist to ambulate x 30 feet with handheld guidance. Will continue to progress as tolerated.     Follow Up Recommendations  SNF     Equipment Recommendations  None recommended by PT    Recommendations for Other Services       Precautions / Restrictions Precautions Precautions: Fall Restrictions Weight Bearing Restrictions: No    Mobility  Bed Mobility Overal bed mobility: Needs Assistance Bed Mobility: Supine to Sit;Sit to Supine     Supine to sit: Mod assist Sit to supine: Mod assist   General bed mobility comments: Max cues for initiation, assist to progress BLE's to edge of bed and trunk to upright. Assist for LE's back into bed as well  Transfers Overall transfer level: Needs assistance Equipment used: 2 person hand held assist Transfers: Sit to/from [employer] Transfers Sit to Stand: Min assist;+2 safety/equipment Stand pivot transfers: Min assist;+2 safety/equipment       General transfer comment: MinA to rise from edge of bed x 2, pivoting to recliner with handheld guidance  Ambulation/Gait Ambulation/Gait assistance: +2 physical assistance;Mod assist Gait Distance (Feet): 30 Feet Assistive device: 2 person hand held assist Gait Pattern/deviations: Step-through pattern;Decreased stride  length;Ataxic;Narrow base of support;Trunk flexed Gait velocity: decreased   General Gait Details: Pt requiring modA + 2, posterior lean, erratic step length   Stairs             Wheelchair Mobility    Modified Rankin (Stroke Patients Only)       Balance Overall balance assessment: Needs assistance Sitting-balance support: No upper extremity supported;Feet unsupported Sitting balance-Leahy Scale: Fair     Standing balance support: Single extremity supported;During functional activity Standing balance-Leahy Scale: Poor Standing balance comment: requiring external support                            Cognition Arousal/Alertness: Awake/alert Behavior During Therapy: Flat affect Overall Cognitive Status: Impaired/Different from baseline Area of Impairment: Problem solving;Awareness;Safety/judgement;Following commands;Memory;Attention;Orientation                 Orientation Level: Disoriented to;Place;Time;Situation Current Attention Level: Sustained Memory: Decreased recall of precautions;Decreased short-term memory Following Commands: Follows one step commands inconsistently Safety/Judgement: Decreased awareness of safety;Decreased awareness of deficits Awareness: Intellectual Problem Solving: Slow processing;Difficulty sequencing;Requires verbal cues;Requires tactile cues General Comments: Seemed internally distracted today, although could not verbalize by what. Denied pain. did have a bowel movement when transferred to bedside commode.      Exercises      General Comments        Pertinent Vitals/Pain Pain Assessment: Faces Faces Pain Scale: No hurt    Home Living                      Prior Function  PT Goals (current goals can now be found in the care plan section) Acute Rehab PT Goals Patient Stated Goal: family asking to get stronger PT Goal Formulation: With patient Time For Goal Achievement: 09/20/20 Potential  to Achieve Goals: Fair    Frequency    Min 3X/week      PT Plan Current plan remains appropriate    Co-evaluation              AM-PAC PT "6 Clicks" Mobility   Outcome Measure  Help needed turning from your back to your side while in a flat bed without using bedrails?: A Little Help needed moving from lying on your back to sitting on the side of a flat bed without using bedrails?: A Lot Help needed moving to and from a bed to a chair (including a wheelchair)?: A Little Help needed standing up from a chair using your arms (e.g., wheelchair or bedside chair)?: A Little Help needed to walk in hospital room?: A Lot Help needed climbing 3-5 steps with a railing? : A Lot 6 Click Score: 15    End of Session Equipment Utilized During Treatment: Gait belt Activity Tolerance: Patient tolerated treatment well Patient left: in bed;with call bell/phone within reach;with bed alarm set;with nursing/sitter in room;with family/visitor present Nurse Communication: Mobility status PT Visit Diagnosis: Other abnormalities of gait and mobility (R26.89)     Time: 6761-9509 PT Time Calculation (min) (ACUTE ONLY): 28 min  Charges:  $Therapeutic Activity: 23-37 mins                     Wyona Almas, PT, DPT Acute Rehabilitation Services Pager [phone] Office [phone]    Deno Etienne 09/06/2020, 3:21 PM

## 2020-09-06 NOTE — Progress Notes (Signed)
  Speech Language Pathology Treatment: Dysphagia;Cognitive-Linquistic  Patient Details Name: Antonio Navarro MRN: [id number] DOB: 1961/03/17 Today's Date: 09/06/2020 Time: 4665-9935 SLP Time Calculation (min) (ACUTE ONLY): 12 min  Assessment / Plan / Recommendation Clinical Impression  Pt sleeping upon arrival, easily awakened.  With left mitt removed and physical support provided under left elbow, pt was able to feed himself bites of applesauce and intermittently hold cup for sips of water with good oral attention, no overt delays, no issues protecting airway.  Poor initiation of motor/speech activity remains a barrier - with tactile cues to initiate movement for feeding he did relatively well. Communication was limited - he nodded/shook his head in response to simple yes/no questions and verbalized after delay on three occasions. Min cues were provided for simple command following within functional activities.  Continue SLP for cognitive-communication and swallowing.   HPI HPI: Pt is a 60 yo male s/p arrest with downtime 9 minutes. Initial rhythm was V. fib, shocked and received 6 rounds of epi before ROSC achieved.  Total CPR time was 30 minutes. ETT 12/4-12/13. MRI 12/7 suggestive of a widespread anoxic injury. PMH includes kidney stones and HTN.      SLP Plan  Continue with current plan of care       Recommendations  Diet recommendations: Dysphagia 2 (fine chop);Thin liquid Liquids provided via: Cup;Straw Medication Administration: Via alternative means Supervision: Staff to assist with self feeding;Full supervision/cueing for compensatory strategies Compensations: Minimize environmental distractions;Slow rate;Small sips/bites Postural Changes and/or Swallow Maneuvers: Seated upright 90 degrees                Oral Care Recommendations: Oral care BID Follow up Recommendations: Skilled Nursing facility SLP Visit Diagnosis: Dysphagia, oral phase (R13.11) Plan: Continue with  current plan of care       GO               Liviya Santini L. Tivis Ringer, [place] CCC/SLP Acute Rehabilitation Services Office number [phone] Pager [phone]  Juan Quam Laurice 09/06/2020, 11:01 AM

## 2020-09-07 ENCOUNTER — Inpatient Hospital Stay: Payer: Self-pay | Admitting: Family Medicine

## 2020-09-07 LAB — BASIC METABOLIC PANEL
Anion gap: 9 (ref 5–15)
BUN: 20 mg/dL (ref 6–20)
CO2: 26 mmol/L (ref 22–32)
Calcium: 9.6 mg/dL (ref 8.9–10.3)
Chloride: 102 mmol/L (ref 98–111)
Creatinine, Ser: 1.4 mg/dL — ABNORMAL HIGH (ref 0.61–1.24)
GFR, Estimated: 58 mL/min — ABNORMAL LOW (ref >60)
Glucose, Bld: 123 mg/dL — ABNORMAL HIGH (ref 70–99)
Potassium: 4 mmol/L (ref 3.5–5.1)
Sodium: 137 mmol/L (ref 135–145)

## 2020-09-07 LAB — HEPATIC FUNCTION PANEL
ALT: 76 U/L — ABNORMAL HIGH (ref 0–44)
AST: 25 U/L (ref 15–41)
Albumin: 3.4 g/dL — ABNORMAL LOW (ref 3.5–5.0)
Alkaline Phosphatase: 79 U/L (ref 38–126)
Bilirubin, Direct: 0.2 mg/dL (ref 0.0–0.2)
Indirect Bilirubin: 0.5 mg/dL (ref 0.3–0.9)
Total Bilirubin: 0.7 mg/dL (ref 0.3–1.2)
Total Protein: 7.1 g/dL (ref 6.5–8.1)

## 2020-09-07 NOTE — Progress Notes (Signed)
[facility] PROGRESS NOTE  KAIL FRALEY UQJ:[id number] DOB: 02-08-1961 DOA: 07/22/2020 PCP: Patient, No Pcp Per       Status: Remains inpatient appropriate because:Altered mental status and Unsafe d/c plan   Dispo: The patient is from: Home              Anticipated d/c is to: SNF              Anticipated d/c date is: > 3 days              Patient currently is medically stable to d/c.  Barriers to discharge: Medicaid and disability pending.Family unable to provide adequate care 24/7 after dc so declined by CIR-focus is now on SNF   Code Status: Full Family Communication: 1/06 spoke with patient's sister [name] -1/18  DVT prophylaxis: sq heparin Vaccination status: Completed both doses of Pfizer Covid vaccination on 12/14/2019  Foley catheter: No  HPI: 60 year old male history of hypertension prescribed Norvasc prior to admission, chronic back pain, prior renal calculi who experienced an out of hospital cardiac arrest with downtime of 9 minutes.  Initial rhythm was ventricular fibrillation.  He was defibrillated deceived 6 doses of epi before ROSC.  Subsequent work-up revealed STEMI.  He underwent cardiac catheterization with a DES placed to the circumflex.  Cardiology following and has placed patient on Brilinta and aspirin and is also being treated with beta-blocker and statins.  From a neurological standpoint he clinically has anoxic brain injury.  During this hospitalization he experienced AKI and was treated with fluids and correction of hypoperfusion from cardiac arrest.  Echo performed this admission revealed ischemic cardiomyopathy with an EF of 40%  In addition to above patient has been unable to pass swallowing evaluations and subsequently has undergone percutaneous PEG tube placement on 12/30.  Patient is awake and moving all extremities spontaneously but does not follow commands and does not verbalize palliative care has met with patient and family and has emphasized  poor prognosis and will continue to meet with the family during the hospitalization.  Current plan is to transition to SNF bed once available.  Expect need for lifelong 24/7 care for all ADLs.  Subjective: Awakened.  Once awake alert and smiling and interactive.  No specific complaints.  Remains minimally conversant and only talks when engaged.  Objective: Vitals:   09/06/20 2216 09/07/20 0441  BP: (!) 149/92 110/78  Pulse:  80  Resp:  18  Temp:  97.8 F (36.6 C)  SpO2:  100%    Intake/Output Summary (Last 24 hours) at 09/07/2020 0755 Last data filed at 09/07/2020 0644 Gross per 24 hour  Intake 370 ml  Output 1900 ml  Net -1530 ml   Filed Weights   09/06/20 0010 09/06/20 0500 09/07/20 0253  Weight: 87.6 kg 87.6 kg 87.3 kg    Exam: Constitutional: Awake, calm, no acute distress Cardiovascular: Normal heart sounds, pulse regular, extremities warm to touch Abdomen: Soft nontender nondistended.  PEG tube in place but currently only being utilized for free water and medications.  Variable oral intake ranging between 30% and 100% of meals eaten.  LBM 1/19 Genitourinary: Condom catheter in place, urine is now light yellow in color Neurologic: CN 2-12 roughly without any appreciable focal neurological deficits, MOE x4, strength is 4/5.   Psychiatric: Awake, oriented to name only.  Flat affect but will smile-continues with significant short-term memory deficits   Assessment/Plan: Acute problems: Out of hospital cardiac arrest secondary to STEMI -ROSC and subsequent  catheterization revealed disease in circumflex requiring DES -Continue beta-blocker, statin; Brilinta which had been held briefly for PEG tube resumed on 12/30 by cardiology  Abnormal urinalysis -Obtained on 1/19.  Urine cloudy in appearance with large amount of hemoglobin, positive nitrate, 30 of protein and few bacteria with 0-5 WBC -Appears to be consistent with dehydration-as noted Free water has been increased as of  1/18 and patient also encouraged to increase oral intake of fluids today -No symptoms, no leukocytosis or fever.  Urine culture pending  Ischemic cardiomyopathy -Echo this admission EF 40% -Continue pharmacotherapy as above -No ACE inhibitor secondary to underlying kidney disease -Continue hydralazine and Isordil -Will need follow-up echo 3 months post initial echo  Significant anoxic brain injury secondary to cardiac arrest/deconditioning/gait instability -Consistently participating with PT and OT.  SLP has cleared for dysphagia 2 with thin liquids -Awaiting confirmation from family that they can provide 24/7 care for several weeks after discharge from CIR -PT will make attempts to increase frequency of therapy including possible daily therapy alternating between PT and OT -No hypertonicity or spasticity observed -Continue Seroquel to 50 mg at HS for insomnia and brain injury sequela -Continue daytime Symmetrel to help contain daytime alertness in context of brain injury -PT session 1/18: Documented that they felt the patient was internally distracted but was unable to verbalize his thoughts or concerns.  Required 2 person moderate assist to ambulate 30 feet with hand-held guidance.  Hypertension -Was on Norvasc prior to admission -Continue cardiovascular meds as listed above  Nonsustained VT -Continue amiodarone and beta-blocker -LFTs were repeated on 1/7 with stable AST but ALT has increased from 52 to 133 -1/19 will repeat in a.m. -Cardiology managing  Acute on chronic kidney disease stage IIIb -Suspect prerenal etiology in the setting of hypoperfusion from ventricular fibrillation arrest -Baseline creatinine between 1.6 and 2; creatinine on 12/19 was 1.67-will repeat in a.m. on 08/19/2020 -Continue free water per tube and intermittently follow labs  Dysphagia, moderate protein calorie malnutrition Nutrition Problem: Inadequate oral intake Etiology: inability to  eat Signs/Symptoms: NPO status  -patient now on D2 diet with thin liquids. DC tube feedings and monitor PO intake-can resume if consistently <50% -1/18 urine dark in appearance therefore free water increased to 200 cc every 2 hours.  As of 1/20 urine is more yellow in appearance.  Patient continues with variable oral intake including fluids therefore we will continue current free water administration regimen Interventions: Tube feeding,Prostat Estimated body mass index is 26.1 kg/m as calculated from the following:   Height as of this encounter: 6' (1.829 m).   Weight as of this encounter: 87.3 kg.  Skin tear right lateral back Wound / Incision (Open or Dehisced) 07/28/20 Skin tear Back Lateral;Right;Upper (Active)  Date First Assessed/Time First Assessed: 07/28/20 0300   Wound Type: Skin tear  Location: Back  Location Orientation: Lateral;Right;Upper  Present on Admission: No    Assessments 07/28/2020  7:49 PM 09/03/2020  9:00 PM  Dressing Type None Other (Comment)  Dressing Status - None  Drainage Amount None -  Treatment Cleansed -     No Linked orders to display    Other problems: Acute hypernatremia -Continue free water per tube  Hyperglycemia without an underlying diagnosis of diabetes mellitus Resolved -Likely related to acute stress -Recent issues with hypoglycemia so sliding scale insulin decreased to very sensitive and Levemir decreased from 30 twice daily to 15 twice daily as of 1/14 -Hemoglobin A1c 4.7  -1/18 refusing CBG checks so TF  held-will stop TF and dc Levemir and SSI. If TF resumed can resume SSI and CBG checks -Serum glucose on 1/19 was 103  Acute hypoxemic respiratory failure -Resolved -Stable on room air with O2 sats between 97 and 99%   Data Reviewed: Basic Metabolic Panel: Recent Labs  Lab 09/06/20 0506 09/07/20 0516  NA 140 137  K 3.7 4.0  CL 105 102  CO2 23 26  GLUCOSE 103* 123*  BUN 24* 20  CREATININE 1.40* 1.40*  CALCIUM 9.5 9.6    Liver Function Tests: Recent Labs  Lab 09/07/20 0516  AST 25  ALT 76*  ALKPHOS 79  BILITOT 0.7  PROT 7.1  ALBUMIN 3.4*   No results for input(s): LIPASE, AMYLASE in the last 168 hours. No results for input(s): AMMONIA in the last 168 hours. CBC: No results for input(s): WBC, NEUTROABS, HGB, HCT, MCV, PLT in the last 168 hours. Cardiac Enzymes: No results for input(s): CKTOTAL, CKMB, CKMBINDEX, TROPONINI in the last 168 hours. BNP (last 3 results) No results for input(s): BNP in the last 8760 hours.  ProBNP (last 3 results) No results for input(s): PROBNP in the last 8760 hours.  CBG: Recent Labs  Lab 09/04/20 1302 09/04/20 1659 09/04/20 2117 09/05/20 0004 09/05/20 0543  GLUCAP 112* 100* 107* 114* 106*    No results found for this or any previous visit (from the past 240 hour(s)).   Studies: No results found.  Scheduled Meds: . amantadine  100 mg Per Tube Daily  . aspirin  81 mg Per Tube Daily  . atorvastatin  80 mg Per Tube Daily  . bethanechol  10 mg Per Tube TID  . carvedilol  25 mg Per Tube BID WC  . chlorhexidine gluconate (MEDLINE KIT)  15 mL Mouth Rinse BID  . feeding supplement (PROSource TF)  45 mL Per Tube TID  . free water  200 mL Per Tube Q2H  . heparin  5,000 Units Subcutaneous Q8H  . isosorbide-hydrALAZINE  1 tablet Per Tube TID  . mouth rinse  15 mL Mouth Rinse BID  . polyvinyl alcohol  1 drop Both Eyes QID  . QUEtiapine  50 mg Per Tube QHS  . sodium chloride flush  3 mL Intravenous Q12H  . ticagrelor  90 mg Per Tube BID   Continuous Infusions: . sodium chloride 250 mL (08/17/20 3662)    Active Problems:   Acute ST elevation myocardial infarction (STEMI) [facility])   Cardiac arrest (HCC)   Elevated blood pressure reading with diagnosis of hypertension   Shock circulatory (HCC)   Encephalopathy   History of ETT   Cardiomyopathy, ischemic   Anoxic brain damage [facility])   Dysphagia   DNR (do not resuscitate) discussion   Gait  instability   Consultants:  Cardiology  Interventional radiology  PCCM  Neurology  Procedures:  Cardiac catheterization 12/4  Continuous EEG 12/4 and overnight EEG with video 12/5  Echocardiogram 12/5  EEG 12/9  Percutaneous PEG tube placement 12/30  Antibiotics: Anti-infectives (From admission, onward)   Start     Dose/Rate Route Frequency Ordered Stop   08/17/20 0000  ceFAZolin (ANCEF) IVPB 2g/100 mL premix        2 g 200 mL/hr over 30 Minutes Intravenous To Radiology 08/15/20 1346 08/17/20 1111   07/22/20 0830  cefTRIAXone (ROCEPHIN) 2 g in sodium chloride 0.9 % 100 mL IVPB        2 g 200 mL/hr over 30 Minutes Intravenous Every 24 hours 07/22/20 0720 07/26/20 0858  Time spent: 20 minutes    Erin Hearing ANP  [facility] 7 am - 330 pm/M-F for direct patient care and secure chat Please refer to Amion for contact information 47 days

## 2020-09-07 NOTE — Plan of Care (Signed)
°  Problem: Clinical Measurements: °Goal: Ability to maintain clinical measurements within normal limits will improve °Outcome: Progressing °  °Problem: Activity: °Goal: Risk for activity intolerance will decrease °Outcome: Progressing °  °Problem: Nutrition: °Goal: Adequate nutrition will be maintained °Outcome: Progressing °  °

## 2020-09-07 NOTE — Progress Notes (Signed)
Nutrition Follow-up  DOCUMENTATION CODES:   Not applicable  INTERVENTION:   -Continue 45 ml Prosource TF TID with meals -Continue 200 ml free water flush every 2 hours -Continue Magic cup TID with meals, each supplement provides 290 kcal and 9 grams of protein -Continue feeding assistance with meals -Initiate 48 hour calorie count  NUTRITION DIAGNOSIS:   Inadequate oral intake related to inability to eat as evidenced by NPO status.  Progressing; advanced to dysphagia 2 diet on 08/29/20  GOAL:   Patient will meet greater than or equal to 90% of their needs  Unmet  MONITOR:   Diet advancement,Labs,Weight trends,TF tolerance,Skin,I & O's  REASON FOR ASSESSMENT:   Consult,Ventilator Enteral/tube feeding initiation and management  ASSESSMENT:   Patient with PMH significant for HTN and kidney stones. Presents this admission s/p cardiac arrest.  12/13- NGT d/c 12/15- cortrak placed, tip of tube in the stomach 12/17- s/p BSE- recommend continue NPO 12/19- pt pulled cortrak tube, replaced- placement verified by x-ray (stomach) 12/20- s/p BSE- pt refusing PO trials 12/30- cortrak removed, PEG placed 1/5- Advanced to thin liquid diet 1/6- Advanced to full liquid diet 1/11- Advanced to dysphagia 2 diet 1/14- transitioned to nocturnal feedings by MD 1/18- TF d/c by MD  Reviewed I/O's: -1.5 L x 24 hours and +7 l since 08/24/20  UOP: 1.9 L x 24 hours  Pt sleeping soundly at time of visit. He did not respond to name being called.   Noted meal completions are erratic. Documented PO's 30-90% yesterday. Breakfast tray on tray table has been untouched.   Given continued erratic intake, RD will initiate another calorie count to better assess PO intake. Pt may benefit from supplemental bolus feedings or resuming nocturnal feedings. Calorie count over the weekend demonstrated that pt was only consuming 30% of his needs.   Per TOC notes, pt continues to await SNF placement.    Labs reviewed: CBGS: 106 (inpatient orders for glycemic control are none).   Diet Order:   Diet Order            DIET DYS 2 Room service appropriate? No; Fluid consistency: Thin  Diet effective now                 EDUCATION NEEDS:   Not appropriate for education at this time  Skin:  Skin Assessment: Skin Integrity Issues: Skin Integrity Issues:: Other (Comment) Other: MASD to buttocks, skin tear to rt upper back  Last BM:  09/07/20  Height:   Ht Readings from Last 1 Encounters:  09/03/20 6' (1.829 m)    Weight:   Wt Readings from Last 1 Encounters:  09/07/20 87.3 kg    Ideal Body Weight:  80.9 kg  BMI:  Body mass index is 26.1 kg/m.  Estimated Nutritional Needs:   Kcal:  2300-2500 kcal  Protein:  115-130 grams  Fluid:  >/= 2 L/day    Loistine Chance, RD, LDN, [place] Registered Dietitian II Certified Diabetes Care and Education Specialist Please refer to [facility] for RD and/or RD on-call/weekend/after hours pager

## 2020-09-08 LAB — BASIC METABOLIC PANEL
Anion gap: 12 (ref 5–15)
BUN: 20 mg/dL (ref 6–20)
CO2: 23 mmol/L (ref 22–32)
Calcium: 9.5 mg/dL (ref 8.9–10.3)
Chloride: 102 mmol/L (ref 98–111)
Creatinine, Ser: 1.38 mg/dL — ABNORMAL HIGH (ref 0.61–1.24)
GFR, Estimated: 59 mL/min — ABNORMAL LOW (ref >60)
Glucose, Bld: 107 mg/dL — ABNORMAL HIGH (ref 70–99)
Potassium: 3.8 mmol/L (ref 3.5–5.1)
Sodium: 137 mmol/L (ref 135–145)

## 2020-09-08 NOTE — Progress Notes (Signed)
Occupational Therapy Treatment Patient Details Name: Antonio Navarro MRN: [id number] DOB: 1960-09-08 Today's Date: 09/08/2020    History of present illness [name] with history of tobacco abuse, obesity, HTN who was admitted 07/22/20 post cardiac arrest with inferolateral STEMI. He was resuscitated in the field by EMS after being found to be in V fib. Approximately 30 minutes of CPR. He was intubated on arrival.  Pt with anoxic brain injury. PEG placed on 08/17/20   OT comments  Patient is making small incremental progress with ADL tasks.  He has improved his edge of bed sitting tolerance and balance, allowing him to participate more with sit grooming and donn/doff socks and hospital gown.  Deficits remain, as listed below.  Patient is being transitioned to 3W, and hopefully will be mobilized more on that unit given the neuro training.  OT will continue efforts in the acute setting.  Would like to progress him to sink side ADL, and improve self feeding.  SNF has been recommended.    Follow Up Recommendations  SNF;Supervision/Assistance - 24 hour    Equipment Recommendations  Wheelchair (measurements OT);Wheelchair cushion (measurements OT);Hospital bed;3 in 1 bedside commode    Recommendations for Other Services      Precautions / Restrictions Precautions Precautions: Fall Precaution Comments: mitts, gtube, posey alarm belt in chair Restrictions Weight Bearing Restrictions: No       Mobility Bed Mobility Overal bed mobility: Needs Assistance Bed Mobility: Supine to Sit     Supine to sit: Mod assist        Transfers Overall transfer level: Needs assistance   Transfers: Sit to/from Stand Sit to Stand: Min assist;+2 physical assistance              Balance   Sitting-balance support: No upper extremity supported;Feet unsupported Sitting balance-Leahy Scale: Fair     Standing balance support: Bilateral upper extremity supported Standing balance-Leahy Scale:  Poor                             ADL either performed or assessed with clinical judgement   ADL       Grooming: Wash/dry face;Sitting;Moderate assistance           Upper Body Dressing : Sitting;Moderate assistance   Lower Body Dressing: Moderate assistance;Sitting/lateral leans       Toileting- Clothing Manipulation and Hygiene: Total assistance;Bed level       Functional mobility during ADLs: +2 for physical assistance;Cueing for safety;Cueing for sequencing;Minimal assistance                         Cognition  No changes from prior session                                                          General Comments  HR to 115 with mobility    Pertinent Vitals/ Pain       Pain Assessment: Faces Faces Pain Scale: No hurt  Frequency  Min 2X/week        Progress Toward Goals  OT Goals(current goals can now be found in the care plan section)  Progress towards OT goals: Progressing toward goals  Acute Rehab OT Goals Patient Stated Goal: family asking to get stronger OT Goal Formulation: With family Time For Goal Achievement: 09/13/20 Potential to Achieve Goals: [place] Discharge plan remains appropriate    Co-evaluation    PT/OT/SLP Co-Evaluation/Treatment: Yes Reason for Co-Treatment: Complexity of the patient's impairments (multi-system involvement);Necessary to address cognition/behavior during functional activity   OT goals addressed during session: ADL's and self-care;Strengthening/ROM      AM-PAC OT "6 Clicks" Daily Activity     Outcome Measure   Help from another person eating meals?: A Lot Help from another person taking care of personal grooming?: A Lot Help from another person toileting, which includes using toliet, bedpan, or urinal?: Total Help from another person bathing (including washing, rinsing, drying)?:  A Lot Help from another person to put on and taking off regular upper body clothing?: A Lot Help from another person to put on and taking off regular lower body clothing?: A Lot 6 Click Score: 11    End of Session Equipment Utilized During Treatment: Gait belt  OT Visit Diagnosis: Muscle weakness (generalized) (M62.81);Pain;Other abnormalities of gait and mobility (R26.89);Cognitive communication deficit (R41.841);Low vision, both eyes (H54.2) Symptoms and signs involving cognitive functions: Other Nontraumatic ICH   Activity Tolerance     Patient Left in bed;with call bell/phone within reach;in chair;with chair alarm set   Nurse Communication Mobility status        Time: 4967-5916 OT Time Calculation (min): 23 min  Charges: OT General Charges $OT Visit: 1 Visit OT Treatments $Self Care/Home Management : 8-22 mins  09/08/2020  Rich, OTR/L  Acute Rehabilitation Services  Office:  [phone]    Metta Clines 09/08/2020, 12:55 PM

## 2020-09-08 NOTE — Progress Notes (Signed)
Calorie Count Note  48 hour calorie count ordered.  Diet: dysphagia 2 diet with thin liquids Supplements: Magic cup TID with meals, each supplement provides 290 kcal and 9 grams of protein  Pt lying in bed at time of visit, minimally interactive with this RD.   Case discussed with NP and RNCM. RD expressed concern about continued erratic oral intake. Per NP, nocturnal feeds were d/c in attempts to stimulate appetite. She also reported one RN did not run nocturnal feeds due to safety concern (pt was agitated and refused CBG checks). NP agrees with calorie count and will consider resuming TF if PO's remains inadequate.   Nutrition-Focused physical exam completed. Findings are no fat depletion, mild muscle depletion, and no edema.   1/20 Breakfast: 573 kcals, 29 grams protein Lunch: 414 kcals, 15 grams protein Dinner: 430 kcals, 21 grams protein  Total intake: 1417 kcal (62% of minimum estimated needs)  65 grams protein (57% of minimum estimated needs)  Nutrition Dx: Inadequate oral intake related to inability to eat as evidenced by NPO status.  Progressing; advanced to dysphagia 2 diet on 08/29/20  Goal: Patient will meet greater than or equal to 90% of their needs  Progressing   Intervention:   -Continue 45 ml Prosource TF TID with meals -Continue 200 ml free water flush every 2 hours -Continue Magic cup TID with meals, each supplement provides 290 kcal and 9 grams of protein -Continue feeding assistance with meals -Continue 48 hour calorie count -If intake remains inadequate, consider re-starting nocturnal feedings:   Jevity 1.5 @ 70 ml/hr via PEG over 14 hour period (2000-1000)  45 ml Prosource TF TID  Continue 200 ml free water flush every 3 hours  Tube feeding regimen provides1590kcal (69% of needs),96grams of protein, and 744ml of H2O. Total free water: 2345 ml daily  Loistine Chance, RD, LDN, [place] Registered Dietitian II Certified Diabetes Care and Education  Specialist Please refer to [facility] for RD and/or RD on-call/weekend/after hours pager

## 2020-09-08 NOTE — Progress Notes (Signed)
Physical Therapy Treatment Patient Details Name: Antonio Navarro MRN: [id number] DOB: 02/18/1961 Today's Date: 09/08/2020    History of Present Illness [name] with history of tobacco abuse, obesity, HTN who was admitted 07/22/20 post cardiac arrest with inferolateral STEMI. He was resuscitated in the field by EMS after being found to be in V fib. Approximately 30 minutes of CPR. He was intubated on arrival.  Pt with anoxic brain injury. PEG placed on 08/17/20    PT Comments    Pt supine soiled in bed on arrival this session.  Pt required assistance to roll to R and L sides.  Mod assist +2 overall this session.  Pt continues to require snf placement at d/c.    Follow Up Recommendations  SNF     Equipment Recommendations  None recommended by PT    Recommendations for Other Services       Precautions / Restrictions Precautions Precautions: Fall Precaution Comments: mitts, gtube, posey alarm belt in chair Restrictions Weight Bearing Restrictions: No    Mobility  Bed Mobility Overal bed mobility: Needs Assistance Bed Mobility: Supine to Sit;Sit to Supine Rolling: Min assist   Supine to sit: Mod assist;+2 for safety/equipment     General bed mobility comments: Cues for sequencing and hand placement to roll and perform pericare.  Pt required cues for hand placement and assistance to move to edge of bed into sitting.  Transfers Overall transfer level: Needs assistance Equipment used: 2 person hand held assist Transfers: Sit to/from Stand Sit to Stand: Mod assist;+2 safety/equipment;+2 physical assistance         General transfer comment: Cues for sequencing and lift assistance to rise into standing.  Poor balance noted.  Ambulation/Gait Ambulation/Gait assistance: Mod assist;+2 physical assistance Gait Distance (Feet): 100 Feet Assistive device: 2 person hand held assist Gait Pattern/deviations: Step-through pattern;Decreased stride length;Ataxic;Narrow base of  support;Trunk flexed;Scissoring     General Gait Details: Pt requiring modA + 2, posterior lean, erratic step length.  Multiple LOB and noted fatigue post gt training.   Stairs             Wheelchair Mobility    Modified Rankin (Stroke Patients Only)       Balance Overall balance assessment: Needs assistance Sitting-balance support: No upper extremity supported;Feet unsupported Sitting balance-Leahy Scale: Fair Sitting balance - Comments: close supervision   Standing balance support: Bilateral upper extremity supported Standing balance-Leahy Scale: Poor Standing balance comment: requiring external support                            Cognition Arousal/Alertness: Awake/alert Behavior During Therapy: Flat affect Overall Cognitive Status: Impaired/Different from baseline Area of Impairment: Following commands;Safety/judgement;Problem solving                       Following Commands: Follows one step commands inconsistently Safety/Judgement: Decreased awareness of safety;Decreased awareness of deficits   Problem Solving: Slow processing;Difficulty sequencing;Requires verbal cues;Requires tactile cues        Exercises      General Comments        Pertinent Vitals/Pain Pain Assessment: Faces Faces Pain Scale: No hurt    Home Living                      Prior Function            PT Goals (current goals can now be found in the  care plan section) Acute Rehab PT Goals Patient Stated Goal: family asking to get stronger Potential to Achieve Goals: Good Progress towards PT goals: Progressing toward goals    Frequency    Min 3X/week      PT Plan Current plan remains appropriate    Co-evaluation PT/OT/SLP Co-Evaluation/Treatment: Yes Reason for Co-Treatment: Complexity of the patient's impairments (multi-system involvement) PT goals addressed during session: Mobility/safety with mobility OT goals addressed during session:  ADL's and self-care      AM-PAC PT "6 Clicks" Mobility   Outcome Measure  Help needed turning from your back to your side while in a flat bed without using bedrails?: A Little Help needed moving from lying on your back to sitting on the side of a flat bed without using bedrails?: A Lot Help needed moving to and from a bed to a chair (including a wheelchair)?: A Lot Help needed standing up from a chair using your arms (e.g., wheelchair or bedside chair)?: A Lot Help needed to walk in hospital room?: A Lot Help needed climbing 3-5 steps with a railing? : A Lot 6 Click Score: 13    End of Session Equipment Utilized During Treatment: Gait belt Activity Tolerance: Patient tolerated treatment well Patient left: with call bell/phone within reach;with nursing/sitter in room;with family/visitor present;in chair;with chair alarm set Nurse Communication: Mobility status PT Visit Diagnosis: Other abnormalities of gait and mobility (R26.89)     Time: 5361-4431 PT Time Calculation (min) (ACUTE ONLY): 23 min  Charges:  $Gait Training: 8-22 mins                     Erasmo Leventhal , PTA Acute Rehabilitation Services Pager [phone] Office [phone]     Anton Cheramie Eli Hose 09/08/2020, 1:11 PM

## 2020-09-08 NOTE — Progress Notes (Signed)
Patient very agitated this more. Will let him rest at the moment.

## 2020-09-08 NOTE — Plan of Care (Signed)
  Problem: Activity: Goal: Risk for activity intolerance will decrease Outcome: Progressing   Problem: Nutrition: Goal: Adequate nutrition will be maintained Outcome: Progressing   

## 2020-09-08 NOTE — Progress Notes (Signed)
[facility] PROGRESS NOTE  Antonio Navarro UTM:[id number] DOB: November 28, 1960 DOA: 07/22/2020 PCP: Patient, No Pcp Per       Status: Remains inpatient appropriate because:Altered mental status and Unsafe d/c plan   Dispo: The patient is from: Home              Anticipated d/c is to: SNF              Anticipated d/c date is: > 3 days              Patient currently is medically stable to d/c.  Barriers to discharge: Medicaid and disability pending.Family unable to provide adequate care 24/7 after dc so declined by CIR-focus is now on SNF   Code Status: Full Family Communication: 1/06 spoke with patient's sister [name] -1/18  DVT prophylaxis: sq heparin Vaccination status: Completed both doses of Pfizer Covid vaccination on 12/14/2019  Foley catheter: No  HPI: 60 year old male history of hypertension prescribed Norvasc prior to admission, chronic back pain, prior renal calculi who experienced an out of hospital cardiac arrest with downtime of 9 minutes.  Initial rhythm was ventricular fibrillation.  He was defibrillated deceived 6 doses of epi before ROSC.  Subsequent work-up revealed STEMI.  He underwent cardiac catheterization with a DES placed to the circumflex.  Cardiology following and has placed patient on Brilinta and aspirin and is also being treated with beta-blocker and statins.  From a neurological standpoint he clinically has anoxic brain injury.  During this hospitalization he experienced AKI and was treated with fluids and correction of hypoperfusion from cardiac arrest.  Echo performed this admission revealed ischemic cardiomyopathy with an EF of 40%  In addition to above patient has been unable to pass swallowing evaluations and subsequently has undergone percutaneous PEG tube placement on 12/30.  Patient is awake and moving all extremities spontaneously but does not follow commands and does not verbalize palliative care has met with patient and family and has emphasized  poor prognosis and will continue to meet with the family during the hospitalization.  Current plan is to transition to SNF bed once available.  Expect need for lifelong 24/7 care for all ADLs.  Subjective: Awake without any significant complaints.  Remains disoriented but follows commands easily.  Objective: Vitals:   09/07/20 2200 09/08/20 0437  BP: (!) 144/91 113/78  Pulse:  86  Resp:  18  Temp:  98.3 F (36.8 C)  SpO2:  96%    Intake/Output Summary (Last 24 hours) at 09/08/2020 0742 Last data filed at 09/08/2020 0347 Gross per 24 hour  Intake 60 ml  Output 1350 ml  Net -1290 ml   Filed Weights   09/06/20 0500 09/07/20 0253 09/08/20 0100  Weight: 87.6 kg 87.3 kg 86 kg    Exam: Constitutional: Calm, no acute distress Cardiovascular: Heart sounds S1-S2, pulse regular, skin warm and dry with adequate capillary refill Abdomen: PEG tube clamped.  Poor intake noted.  Bowel sounds present and abdomen nontender.  LBM 1/20 Genitourinary: Condom catheter in place with yellow clear urine to bedside bag Neurologic: CN 2-12 roughly without any appreciable focal neurological deficits, MOE x4, strength is 4/5.   Psychiatric: Awake, oriented to name only.  Flat affect but will smile-continues with significant short-term memory deficits   Assessment/Plan: Acute problems: Out of hospital cardiac arrest secondary to STEMI -ROSC and subsequent catheterization revealed disease in circumflex requiring DES -Continue beta-blocker, statin; Brilinta which had been held briefly for PEG tube resumed on 12/30 by  cardiology  Abnormal urinalysis -Obtained on 1/19.  Urine cloudy in appearance with large amount of hemoglobin, positive nitrate, 30 of protein and few bacteria with 0-5 WBC -Appears to be consistent with dehydration-as noted Free water has been increased as of 1/18 and patient also encouraged to increase oral intake of fluids today -No symptoms, no leukocytosis or fever.  Urine culture  pending  Ischemic cardiomyopathy -Echo this admission EF 40% -Continue pharmacotherapy as above -No ACE inhibitor secondary to underlying kidney disease -Continue hydralazine and Isordil -Will need follow-up echo 3 months post initial echo  Significant anoxic brain injury secondary to cardiac arrest/deconditioning/gait instability -Consistently participating with PT and OT.  SLP has cleared for dysphagia 2 with thin liquids -Awaiting confirmation from family that they can provide 24/7 care for several weeks after discharge from CIR -PT will make attempts to increase frequency of therapy including possible daily therapy alternating between PT and OT -No hypertonicity or spasticity observed -Continue Seroquel to 50 mg at HS for insomnia and brain injury sequela -Continue daytime Symmetrel to help contain daytime alertness in context of brain injury -PT session 1/18: Documented that they felt the patient was internally distracted but was unable to verbalize his thoughts or concerns.  Required 2 person moderate assist to ambulate 30 feet with hand-held guidance.  Hypertension -Was on Norvasc prior to admission -Continue cardiovascular meds as listed above  Nonsustained VT -Continue amiodarone and beta-blocker -LFTs were repeated on 1/7 with stable AST but ALT has increased from 52 to 133 -1/19 will repeat in a.m. -Cardiology managing  Acute on chronic kidney disease stage IIIb -Suspect prerenal etiology in the setting of hypoperfusion from ventricular fibrillation arrest -Baseline creatinine between 1.6 and 2; creatinine on 12/19 was 1.67-will repeat in a.m. on 08/19/2020 -Continue free water per tube and intermittently follow labs  Dysphagia, moderate protein calorie malnutrition Nutrition Problem: Inadequate oral intake Etiology: inability to eat Signs/Symptoms: NPO status  -patient now on D2 diet with thin liquids. DC tube feedings and monitor PO intake-can resume if consistently  <50% -1/18 urine dark in appearance therefore free water increased to 200 cc every 2 hours.  As of 1/20 urine is more yellow in appearance.  Patient continues with variable oral intake including fluids therefore we will continue current free water administration regimen -1/21 plan is to initiate calorie count.  If by Monday patient continues with inadequate oral intake will resume nocturnal tube feedings. Interventions: Tube feeding,Prostat Estimated body mass index is 25.71 kg/m as calculated from the following:   Height as of this encounter: 6' (1.829 m).   Weight as of this encounter: 86 kg.  Skin tear right lateral back Wound / Incision (Open or Dehisced) 07/28/20 Skin tear Back Lateral;Right;Upper (Active)  Date First Assessed/Time First Assessed: 07/28/20 0300   Wound Type: Skin tear  Location: Back  Location Orientation: Lateral;Right;Upper  Present on Admission: No    Assessments 07/28/2020  7:49 PM 09/03/2020  9:00 PM  Dressing Type None Other (Comment)  Dressing Status -- None  Drainage Amount None --  Treatment Cleansed --     No Linked orders to display    Other problems: Acute hypernatremia -Continue free water per tube  Hyperglycemia without an underlying diagnosis of diabetes mellitus Resolved -Likely related to acute stress -Recent issues with hypoglycemia so sliding scale insulin decreased to very sensitive and Levemir decreased from 30 twice daily to 15 twice daily as of 1/14 -Hemoglobin A1c 4.7  -1/18 refusing CBG checks so TF held-will  stop TF and dc Levemir and SSI. If TF resumed can resume SSI and CBG checks -Serum glucose on 1/19 was 103  Acute hypoxemic respiratory failure -Resolved -Stable on room air with O2 sats between 97 and 99%   Data Reviewed: Basic Metabolic Panel: Recent Labs  Lab 09/06/20 0506 09/07/20 0516 09/08/20 0407  NA 140 137 137  K 3.7 4.0 3.8  CL 105 102 102  CO2 '23 26 23  ' GLUCOSE 103* 123* 107*  BUN 24* 20 20  CREATININE  1.40* 1.40* 1.38*  CALCIUM 9.5 9.6 9.5   Liver Function Tests: Recent Labs  Lab 09/07/20 0516  AST 25  ALT 76*  ALKPHOS 79  BILITOT 0.7  PROT 7.1  ALBUMIN 3.4*   No results for input(s): LIPASE, AMYLASE in the last 168 hours. No results for input(s): AMMONIA in the last 168 hours. CBC: No results for input(s): WBC, NEUTROABS, HGB, HCT, MCV, PLT in the last 168 hours. Cardiac Enzymes: No results for input(s): CKTOTAL, CKMB, CKMBINDEX, TROPONINI in the last 168 hours. BNP (last 3 results) No results for input(s): BNP in the last 8760 hours.  ProBNP (last 3 results) No results for input(s): PROBNP in the last 8760 hours.  CBG: Recent Labs  Lab 09/04/20 1302 09/04/20 1659 09/04/20 2117 09/05/20 0004 09/05/20 0543  GLUCAP 112* 100* 107* 114* 106*    No results found for this or any previous visit (from the past 240 hour(s)).   Studies: No results found.  Scheduled Meds:  amantadine  100 mg Per Tube Daily   aspirin  81 mg Per Tube Daily   atorvastatin  80 mg Per Tube Daily   bethanechol  10 mg Per Tube TID   carvedilol  25 mg Per Tube BID WC   chlorhexidine gluconate (MEDLINE KIT)  15 mL Mouth Rinse BID   feeding supplement (PROSource TF)  45 mL Per Tube TID   free water  200 mL Per Tube Q2H   heparin  5,000 Units Subcutaneous Q8H   isosorbide-hydrALAZINE  1 tablet Per Tube TID   mouth rinse  15 mL Mouth Rinse BID   polyvinyl alcohol  1 drop Both Eyes QID   QUEtiapine  50 mg Per Tube QHS   sodium chloride flush  3 mL Intravenous Q12H   ticagrelor  90 mg Per Tube BID   Continuous Infusions:  sodium chloride 250 mL (08/17/20 0812)    Active Problems:   Acute ST elevation myocardial infarction (STEMI) (HCC)   Cardiac arrest (HCC)   Elevated blood pressure reading with diagnosis of hypertension   Shock circulatory (HCC)   Encephalopathy   History of ETT   Cardiomyopathy, ischemic   Anoxic brain damage ([place])   Dysphagia   DNR (do not  resuscitate) discussion   Gait instability   Consultants:  Cardiology  Interventional radiology  PCCM  Neurology  Procedures:  Cardiac catheterization 12/4  Continuous EEG 12/4 and overnight EEG with video 12/5  Echocardiogram 12/5  EEG 12/9  Percutaneous PEG tube placement 12/30  Antibiotics: Anti-infectives (From admission, onward)   Start     Dose/Rate Route Frequency Ordered Stop   08/17/20 0000  ceFAZolin (ANCEF) IVPB 2g/100 mL premix        2 g 200 mL/hr over 30 Minutes Intravenous To Radiology 08/15/20 1346 08/17/20 1111   07/22/20 0830  cefTRIAXone (ROCEPHIN) 2 g in sodium chloride 0.9 % 100 mL IVPB        2 g 200 mL/hr over 30  Minutes Intravenous Every 24 hours 07/22/20 0720 07/26/20 0858       Time spent: 20 minutes    Erin Hearing ANP  [facility] 7 am - 330 pm/M-F for direct patient care and secure chat Please refer to Amion for contact information 48 days

## 2020-09-09 LAB — BASIC METABOLIC PANEL
Anion gap: 13 (ref 5–15)
BUN: 19 mg/dL (ref 6–20)
CO2: 20 mmol/L — ABNORMAL LOW (ref 22–32)
Calcium: 9.5 mg/dL (ref 8.9–10.3)
Chloride: 105 mmol/L (ref 98–111)
Creatinine, Ser: 1.45 mg/dL — ABNORMAL HIGH (ref 0.61–1.24)
GFR, Estimated: 56 mL/min — ABNORMAL LOW (ref >60)
Glucose, Bld: 109 mg/dL — ABNORMAL HIGH (ref 70–99)
Potassium: 3.9 mmol/L (ref 3.5–5.1)
Sodium: 138 mmol/L (ref 135–145)

## 2020-09-09 LAB — URINE CULTURE

## 2020-09-09 NOTE — Progress Notes (Signed)
PROGRESS NOTE    Antonio Navarro  VZD:[id number] DOB: June 16, 1961 DOA: 07/22/2020 PCP: Patient, No Pcp Per    Brief Narrative:  Antonio Navarro is a 60 year old male with past medical history significant for essential hypertension, chronic back pain, history of nephrolithiasis who presented to the ED following out of hospital cardiac arrest with downtime of 9 minutes.  Initial rhythm documented was ventricular fibrillation and patient was defibrillated and received 6 doses of epinephrine prior to achieving ROSC.  He underwent cardiac catheterization with placement of a DES to circumflex.  Hospital course has been complicated by his encephalopathy likely secondary to anoxic brain injury with associated dysphagia and AKI from poor oral intake requiring percutaneous PEG tube placement on 08/17/2020.  Palliative care has been following given his overall poor prognosis with the current plan to transition to skilled nursing facility once available.  He is expected to require lifelong 24/7 care for his activities of daily living.   Assessment & Plan:   Active Problems:   Acute ST elevation myocardial infarction (STEMI) (HCC)   Cardiac arrest (HCC)   Elevated blood pressure reading with diagnosis of hypertension   Shock circulatory (HCC)   Encephalopathy   History of ETT   Cardiomyopathy, ischemic   Anoxic brain damage ([place])   Dysphagia   DNR (do not resuscitate) discussion   Gait instability   Out of hospital cardiac arrest Ventricular fibrillation STEMI Ischemic cardiomyopathy Patient presented following out of hospital cardiac arrest status post defibrillation for ventricular fibrillation.  Underwent left heart catheterization with DES to circumflex.  TTE with LVEF 40% --Continue beta-blocker, statin, Brilinta, hydralazine and Isordil --Aspirin/statin --Plan follow-up TTE 3 months following stent placement to assess for LVEF recovery --Outpatient follow-up with cardiology following  discharge  Encephalopathy secondary to anoxic brain injury from cardiac arrest and prolonged downtime --Seroquel 50 mg qHS --Supportive care --Pending placement as patient will require lifelong assistance with ADLs  Dysphagia Moderate protein calorie malnutrition Body mass index is 25.71 kg/m. Nutrition Status: Nutrition Problem: Inadequate oral intake Etiology: inability to eat Signs/Symptoms: NPO status Interventions: Tube feeding,Prostat --Dietitian following, appreciate assistance. --Dysphagia 2 diet, continues on tube feeds --Calorie count in progress     DVT prophylaxis: Heparin Code Status: Full code Family Communication: No family present at bedside  Disposition Plan:  Level of care: Med-Surg Status is: Inpatient  Remains inpatient appropriate because:Unsafe d/c plan   Dispo: The patient is from: Home              Anticipated d/c is to: SNF              Anticipated d/c date is: 1 day              Patient currently is medically stable to d/c.   Difficult to place patient Yes   Consultants:   PCCM  Cardiology  Procedures:   DES to 95% circumflex stenosis 07/22/2020  Echo 12/5>>EF 29-51%, grade 1 diastolic dysfunction  EEG 12/9>> diffuse encephalopathy but no seizure activity.  Antimicrobials:   Ceftriaxone 12/4 - 12/8  Cefazolin 12/28 - 12/30    Subjective: Patient seen and examined bedside, resting comfortably lying in bed.  Watching TV.  Alert but poorly interactive.  No complaints.  No acute concerns overnight per nursing staff.  Continues to be difficult placement.  Objective: Vitals:   09/08/20 1029 09/08/20 2046 09/09/20 0413 09/09/20 0825  BP: 115/78 (!) 153/87 115/81 110/81  Pulse: 84 83 92 91  Resp: 16 16  16 16  Temp: 97.6 F (36.4 C) 97.8 F (36.6 C) 98.2 F (36.8 C)   TempSrc: Oral  Oral   SpO2: 100% 98%    Weight:      Height:        Intake/Output Summary (Last 24 hours) at 09/09/2020 1052 Last data filed at 09/09/2020  0100 Gross per 24 hour  Intake 240 ml  Output 1255 ml  Net -1015 ml   Filed Weights   09/06/20 0500 09/07/20 0253 09/08/20 0100  Weight: 87.6 kg 87.3 kg 86 kg    Examination:  General exam: Appears calm and comfortable  Respiratory system: Clear to auscultation. Respiratory effort normal.  Oxygenating well on room air Cardiovascular system: S1 & S2 heard, RRR. No JVD, murmurs, rubs, gallops or clicks. No pedal edema. Gastrointestinal system: Abdomen is nondistended, soft and nontender. No organomegaly or masses felt. Normal bowel sounds heard.  Noted PEG tube Central nervous system: Alert.  Moving all extremities independently Extremities: Symmetric 5 x 5 power. Skin: No rashes, lesions or ulcers Psychiatry: Judgement and insight appear poor.     Data Reviewed: I have personally reviewed following labs and imaging studies  CBC: No results for input(s): WBC, NEUTROABS, HGB, HCT, MCV, PLT in the last 168 hours. Basic Metabolic Panel: Recent Labs  Lab 09/06/20 0506 09/07/20 0516 09/08/20 0407 09/09/20 0504  NA 140 137 137 138  K 3.7 4.0 3.8 3.9  CL 105 102 102 105  CO2 '23 26 23 ' 20*  GLUCOSE 103* 123* 107* 109*  BUN 24* '20 20 19  ' CREATININE 1.40* 1.40* 1.38* 1.45*  CALCIUM 9.5 9.6 9.5 9.5   GFR: Estimated Creatinine Clearance: 61 mL/min (A) (by C-G formula based on SCr of 1.45 mg/dL (H)). Liver Function Tests: Recent Labs  Lab 09/07/20 0516  AST 25  ALT 76*  ALKPHOS 79  BILITOT 0.7  PROT 7.1  ALBUMIN 3.4*   No results for input(s): LIPASE, AMYLASE in the last 168 hours. No results for input(s): AMMONIA in the last 168 hours. Coagulation Profile: No results for input(s): INR, PROTIME in the last 168 hours. Cardiac Enzymes: No results for input(s): CKTOTAL, CKMB, CKMBINDEX, TROPONINI in the last 168 hours. BNP (last 3 results) No results for input(s): PROBNP in the last 8760 hours. HbA1C: No results for input(s): HGBA1C in the last 72 hours. CBG: Recent  Labs  Lab 09/04/20 1302 09/04/20 1659 09/04/20 2117 09/05/20 0004 09/05/20 0543  GLUCAP 112* 100* 107* 114* 106*   Lipid Profile: No results for input(s): CHOL, HDL, LDLCALC, TRIG, CHOLHDL, LDLDIRECT in the last 72 hours. Thyroid Function Tests: No results for input(s): TSH, T4TOTAL, FREET4, T3FREE, THYROIDAB in the last 72 hours. Anemia Panel: No results for input(s): VITAMINB12, FOLATE, FERRITIN, TIBC, IRON, RETICCTPCT in the last 72 hours. Sepsis Labs: No results for input(s): PROCALCITON, LATICACIDVEN in the last 168 hours.  Recent Results (from the past 240 hour(s))  Culture, Urine     Status: Abnormal   Collection Time: 09/06/20  7:51 PM   Specimen: Urine, Random  Result Value Ref Range Status   Specimen Description URINE, RANDOM  Final   Special Requests   Final    NONE Performed at [facility], [address], [place], [place] [address]    Culture MULTIPLE SPECIES PRESENT, SUGGEST RECOLLECTION (A)  Final   Report Status 09/09/2020 FINAL  Final         Radiology Studies: No results found.      Scheduled Meds: .  amantadine  100 mg Per Tube Daily  . aspirin  81 mg Per Tube Daily  . atorvastatin  80 mg Per Tube Daily  . bethanechol  10 mg Per Tube TID  . carvedilol  25 mg Per Tube BID WC  . chlorhexidine gluconate (MEDLINE KIT)  15 mL Mouth Rinse BID  . feeding supplement (PROSource TF)  45 mL Per Tube TID  . free water  200 mL Per Tube Q2H  . heparin  5,000 Units Subcutaneous Q8H  . isosorbide-hydrALAZINE  1 tablet Per Tube TID  . mouth rinse  15 mL Mouth Rinse BID  . polyvinyl alcohol  1 drop Both Eyes QID  . QUEtiapine  50 mg Per Tube QHS  . sodium chloride flush  3 mL Intravenous Q12H  . ticagrelor  90 mg Per Tube BID   Continuous Infusions: . sodium chloride 250 mL (08/17/20 0812)     LOS: 49 days    Time spent: 39 minutes spent on chart review, discussion with nursing staff, consultants, updating family and interview/physical exam;  more than 50% of that time was spent in counseling and/or coordination of care.    Kerrington Greenhalgh J [place] DO [facility] Available via Epic secure chat 7am-7pm After these hours, please refer to coverage provider listed on [email or web address] 09/09/2020, 10:52 AM

## 2020-09-10 MED ORDER — LORAZEPAM 2 MG/ML IJ SOLN
0.2500 mg | Freq: Once | INTRAMUSCULAR | Status: AC
  Administered 2020-09-10: 0.25 mg via INTRAVENOUS
  Filled 2020-09-10: qty 1

## 2020-09-10 NOTE — Progress Notes (Signed)
PROGRESS NOTE    Antonio Navarro  [id number]:[id number] DOB: [birth year] DOA: 07/22/2020 PCP: Patient, No Pcp Per    Brief Narrative:  Antonio Navarro is a 60 year old male with past medical history significant for essential hypertension, chronic back pain, history of nephrolithiasis who presented to the ED following out of hospital cardiac arrest with downtime of 9 minutes.  Initial rhythm documented was ventricular fibrillation and patient was defibrillated and received 6 doses of epinephrine prior to achieving ROSC.  He underwent cardiac catheterization with placement of a DES to circumflex.  Hospital course has been complicated by his encephalopathy likely secondary to anoxic brain injury with associated dysphagia and AKI from poor oral intake requiring percutaneous PEG tube placement on 08/17/2020.  Palliative care has been following given his overall poor prognosis with the current plan to transition to skilled nursing facility once available.  He is expected to require lifelong 24/7 care for his activities of daily living.   Assessment & Plan:   Active Problems:   Acute ST elevation myocardial infarction (STEMI) (HCC)   Cardiac arrest (HCC)   Elevated blood pressure reading with diagnosis of hypertension   Shock circulatory (HCC)   Encephalopathy   History of ETT   Cardiomyopathy, ischemic   Anoxic brain damage ([place])   Dysphagia   DNR (do not resuscitate) discussion   Gait instability   Out of hospital cardiac arrest Ventricular fibrillation STEMI Ischemic cardiomyopathy Patient presented following out of hospital cardiac arrest status post defibrillation for ventricular fibrillation.  Underwent left heart catheterization with DES to circumflex.  TTE with LVEF 40% --Continue beta-blocker, statin, Brilinta, hydralazine and Isordil --Aspirin/statin --Plan follow-up TTE 3 months following stent placement to assess for LVEF recovery --Outpatient follow-up with cardiology following  discharge  Encephalopathy secondary to anoxic brain injury from cardiac arrest and prolonged downtime --Seroquel 50 mg qHS --Supportive care --Pending placement as patient will require lifelong assistance with ADLs  Dysphagia Moderate protein calorie malnutrition Body mass index is 25.68 kg/m. Nutrition Status: Nutrition Problem: Inadequate oral intake Etiology: inability to eat Signs/Symptoms: NPO status Interventions: Tube feeding,Prostat --Dietitian following, appreciate assistance. --Dysphagia 2 diet, continues on tube feeds --Calorie count in progress     DVT prophylaxis: Heparin Code Status: Full code Family Communication: No family present at bedside  Disposition Plan:  Level of care: Med-Surg Status is: Inpatient  Remains inpatient appropriate because:Unsafe d/c plan   Dispo: The patient is from: Home              Anticipated d/c is to: SNF              Anticipated d/c date is: 1 day              Patient currently is medically stable to d/c.   Difficult to place patient Yes   Consultants:   PCCM  Cardiology  Procedures:   DES to 95% circumflex stenosis 07/22/2020  Echo 12/5>>EF 88-89%, grade 1 diastolic dysfunction  EEG 12/9>> diffuse encephalopathy but no seizure activity.  Antimicrobials:   Ceftriaxone 12/4 - 12/8  Cefazolin 12/28 - 12/30    Subjective: Patient seen and examined bedside, resting comfortably lying in bed.  Watching TV.  Currently eating breakfast with assistance of NT, [name]. No complaints.  No acute concerns overnight per nursing staff.  Continues to be difficult placement.  Objective: Vitals:   09/09/20 1700 09/09/20 2055 09/10/20 0500 09/10/20 0550  BP: 119/87 133/90  116/77  Pulse: 76 80  82  Resp:  '18 18  18  ' Temp: 98 F (36.7 C) 98.8 F (37.1 C)  98.1 F (36.7 C)  TempSrc: Axillary Oral  Oral  SpO2:    98%  Weight:   85.9 kg   Height:        Intake/Output Summary (Last 24 hours) at 09/10/2020 1042 Last data  filed at 09/10/2020 1012 Gross per 24 hour  Intake 7300 ml  Output 1750 ml  Net 5550 ml   Filed Weights   09/07/20 0253 09/08/20 0100 09/10/20 0500  Weight: 87.3 kg 86 kg 85.9 kg    Examination:  General exam: Appears calm and comfortable  Respiratory system: Clear to auscultation. Respiratory effort normal.  Oxygenating well on room air Cardiovascular system: S1 & S2 heard, RRR. No JVD, murmurs, rubs, gallops or clicks. No pedal edema. Gastrointestinal system: Abdomen is nondistended, soft and nontender. No organomegaly or masses felt. Normal bowel sounds heard.  Noted PEG tube Central nervous system: Alert.  Moving all extremities independently Extremities: Symmetric 5 x 5 power. Skin: No rashes, lesions or ulcers Psychiatry: Judgement and insight appear poor.     Data Reviewed: I have personally reviewed following labs and imaging studies  CBC: No results for input(s): WBC, NEUTROABS, HGB, HCT, MCV, PLT in the last 168 hours. Basic Metabolic Panel: Recent Labs  Lab 09/06/20 0506 09/07/20 0516 09/08/20 0407 09/09/20 0504  NA 140 137 137 138  K 3.7 4.0 3.8 3.9  CL 105 102 102 105  CO2 '23 26 23 ' 20*  GLUCOSE 103* 123* 107* 109*  BUN 24* '20 20 19  ' CREATININE 1.40* 1.40* 1.38* 1.45*  CALCIUM 9.5 9.6 9.5 9.5   GFR: Estimated Creatinine Clearance: 61 mL/min (A) (by C-G formula based on SCr of 1.45 mg/dL (H)). Liver Function Tests: Recent Labs  Lab 09/07/20 0516  AST 25  ALT 76*  ALKPHOS 79  BILITOT 0.7  PROT 7.1  ALBUMIN 3.4*   No results for input(s): LIPASE, AMYLASE in the last 168 hours. No results for input(s): AMMONIA in the last 168 hours. Coagulation Profile: No results for input(s): INR, PROTIME in the last 168 hours. Cardiac Enzymes: No results for input(s): CKTOTAL, CKMB, CKMBINDEX, TROPONINI in the last 168 hours. BNP (last 3 results) No results for input(s): PROBNP in the last 8760 hours. HbA1C: No results for input(s): HGBA1C in the last 72  hours. CBG: Recent Labs  Lab 09/04/20 1302 09/04/20 1659 09/04/20 2117 09/05/20 0004 09/05/20 0543  GLUCAP 112* 100* 107* 114* 106*   Lipid Profile: No results for input(s): CHOL, HDL, LDLCALC, TRIG, CHOLHDL, LDLDIRECT in the last 72 hours. Thyroid Function Tests: No results for input(s): TSH, T4TOTAL, FREET4, T3FREE, THYROIDAB in the last 72 hours. Anemia Panel: No results for input(s): VITAMINB12, FOLATE, FERRITIN, TIBC, IRON, RETICCTPCT in the last 72 hours. Sepsis Labs: No results for input(s): PROCALCITON, LATICACIDVEN in the last 168 hours.  Recent Results (from the past 240 hour(s))  Culture, Urine     Status: Abnormal   Collection Time: 09/06/20  7:51 PM   Specimen: Urine, Random  Result Value Ref Range Status   Specimen Description URINE, RANDOM  Final   Special Requests   Final    NONE Performed at [facility], [address] [place], [place] [address]    Culture MULTIPLE SPECIES PRESENT, SUGGEST RECOLLECTION (A)  Final   Report Status 09/09/2020 FINAL  Final         Radiology Studies: No results found.  Scheduled Meds: . amantadine  100 mg Per Tube Daily  . aspirin  81 mg Per Tube Daily  . atorvastatin  80 mg Per Tube Daily  . bethanechol  10 mg Per Tube TID  . carvedilol  25 mg Per Tube BID WC  . chlorhexidine gluconate (MEDLINE KIT)  15 mL Mouth Rinse BID  . feeding supplement (PROSource TF)  45 mL Per Tube TID  . free water  200 mL Per Tube Q2H  . heparin  5,000 Units Subcutaneous Q8H  . isosorbide-hydrALAZINE  1 tablet Per Tube TID  . mouth rinse  15 mL Mouth Rinse BID  . polyvinyl alcohol  1 drop Both Eyes QID  . QUEtiapine  50 mg Per Tube QHS  . sodium chloride flush  3 mL Intravenous Q12H  . ticagrelor  90 mg Per Tube BID   Continuous Infusions: . sodium chloride 250 mL (08/17/20 0812)     LOS: 50 days    Time spent: 35 minutes spent on chart review, discussion with nursing staff, consultants, updating family and  interview/physical exam; more than 50% of that time was spent in counseling and/or coordination of care.    Taylorann Tkach J [place] DO [facility] Available via Epic secure chat 7am-7pm After these hours, please refer to coverage provider listed on [email or web address] 09/10/2020, 10:42 AM

## 2020-09-11 MED ORDER — ACETAMINOPHEN 325 MG PO TABS
650.0000 mg | ORAL_TABLET | Freq: Four times a day (QID) | ORAL | Status: DC | PRN
  Administered 2020-09-11 – 2020-10-15 (×3): 650 mg
  Filled 2020-09-11 (×3): qty 2

## 2020-09-11 MED ORDER — JEVITY 1.5 CAL/FIBER PO LIQD
780.0000 mL | ORAL | Status: DC
  Administered 2020-09-11 – 2020-10-15 (×26): 780 mL
  Filled 2020-09-11 (×3): qty 1000
  Filled 2020-09-11: qty 948
  Filled 2020-09-11 (×9): qty 1000
  Filled 2020-09-11: qty 948
  Filled 2020-09-11 (×4): qty 1000
  Filled 2020-09-11: qty 948
  Filled 2020-09-11 (×3): qty 1000
  Filled 2020-09-11 (×2): qty 948
  Filled 2020-09-11 (×10): qty 1000
  Filled 2020-09-11: qty 948
  Filled 2020-09-11 (×4): qty 1000

## 2020-09-11 MED ORDER — PROSOURCE TF PO LIQD
45.0000 mL | Freq: Two times a day (BID) | ORAL | Status: DC
  Administered 2020-09-11 – 2020-10-20 (×78): 45 mL
  Filled 2020-09-11 (×82): qty 45

## 2020-09-11 NOTE — Progress Notes (Signed)
Physical Therapy Treatment Patient Details Name: Antonio Navarro MRN: [id number] DOB: 09/24/1960 Today's Date: 09/11/2020    History of Present Illness [name] with history of tobacco abuse, obesity, HTN who was admitted 07/22/20 post cardiac arrest with inferolateral STEMI. He was resuscitated in the field by EMS after being found to be in V fib. Approximately 30 minutes of CPR. He was intubated on arrival.  Pt with anoxic brain injury. PEG placed on 08/17/20    PT Comments    Pt maintaining level of functional mobility. Ambulating x 100 feet with two person handheld assist. Continues with poor balance, weakness, and cognitive deficits. Continue to recommend SNF for ongoing Physical Therapy.      Follow Up Recommendations  SNF     Equipment Recommendations  None recommended by PT    Recommendations for Other Services       Precautions / Restrictions Precautions Precautions: Fall;Other (comment) Precaution Comments: mitts, gtube Restrictions Weight Bearing Restrictions: No    Mobility  Bed Mobility Overal bed mobility: Needs Assistance Bed Mobility: Supine to Sit;Sit to Supine     Supine to sit: Max assist Sit to supine: Mod assist   General bed mobility comments: MaxA for initiation of BLE's off edge of bed, trunk to upright. Assist for BLE's back into bed following return  Transfers Overall transfer level: Needs assistance Equipment used: 2 person hand held assist Transfers: Sit to/from Stand Sit to Stand: Min assist;Mod assist;+2 physical assistance Stand pivot transfers: Min assist;+2 physical assistance       General transfer comment: Min-modA + 2 to rise multiple times from edge of bed and BSC, cues for initiation  Ambulation/Gait Ambulation/Gait assistance: Mod assist;+2 physical assistance Gait Distance (Feet): 100 Feet Assistive device: 2 person hand held assist Gait Pattern/deviations: Step-through pattern;Decreased stride length;Narrow base of  support;Trunk flexed;Scissoring Gait velocity: decreased   General Gait Details: ModA + 2 for stability, occasional scissoring, cues provided for activity pacing   Stairs             Wheelchair Mobility    Modified Rankin (Stroke Patients Only)       Balance Overall balance assessment: Needs assistance Sitting-balance support: No upper extremity supported;Feet unsupported Sitting balance-Leahy Scale: Fair     Standing balance support: Bilateral upper extremity supported Standing balance-Leahy Scale: Poor Standing balance comment: requiring external support                            Cognition Arousal/Alertness: Awake/alert Behavior During Therapy: Flat affect Overall Cognitive Status: Impaired/Different from baseline Area of Impairment: Following commands;Safety/judgement;Problem solving                   Current Attention Level: Sustained Memory: Decreased recall of precautions;Decreased short-term memory Following Commands: Follows one step commands inconsistently Safety/Judgement: Decreased awareness of safety;Decreased awareness of deficits Awareness: Intellectual Problem Solving: Slow processing;Difficulty sequencing;Requires verbal cues;Requires tactile cues        Exercises      General Comments        Pertinent Vitals/Pain Pain Assessment: Faces Faces Pain Scale: No hurt    Home Living                      Prior Function            PT Goals (current goals can now be found in the care plan section) Acute Rehab PT Goals Potential to Achieve Goals:  Fair    Frequency    Min 3X/week      PT Plan Current plan remains appropriate    Co-evaluation              AM-PAC PT "6 Clicks" Mobility   Outcome Measure  Help needed turning from your back to your side while in a flat bed without using bedrails?: A Little Help needed moving from lying on your back to sitting on the side of a flat bed without using  bedrails?: Total Help needed moving to and from a bed to a chair (including a wheelchair)?: A Little Help needed standing up from a chair using your arms (e.g., wheelchair or bedside chair)?: A Lot Help needed to walk in hospital room?: A Lot Help needed climbing 3-5 steps with a railing? : A Lot 6 Click Score: 13    End of Session Equipment Utilized During Treatment: Gait belt Activity Tolerance: Patient tolerated treatment well Patient left: in bed;with call bell/phone within reach;with bed alarm set Nurse Communication: Mobility status PT Visit Diagnosis: Other abnormalities of gait and mobility (R26.89)     Time: 9357-0177 PT Time Calculation (min) (ACUTE ONLY): 23 min  Charges:  $Therapeutic Activity: 23-37 mins                     Wyona Almas, PT, DPT Acute Rehabilitation Services Pager [phone] Office [phone]    Deno Etienne 09/11/2020, 11:27 AM

## 2020-09-11 NOTE — Progress Notes (Addendum)
[facility] PROGRESS NOTE  Antonio Navarro [id number]:[id number] DOB: 1960-10-17 DOA: 07/22/2020 PCP: Patient, No Pcp Per       Status: Remains inpatient appropriate because:Altered mental status and Unsafe d/c plan   Dispo: The patient is from: Home              Anticipated d/c is to: SNF              Anticipated d/c date is: > 3 days              Patient currently is medically stable to d/c.  Barriers to discharge: Medicaid and disability pending.Family unable to provide adequate care 24/7 after dc so declined by CIR-focus is now on SNF   Code Status: Full Family Communication: 1/06 spoke with patient's sister [name] -1/24 DVT prophylaxis: sq heparin Vaccination status: Completed both doses of Pfizer Covid vaccination on 12/14/2019  Foley catheter: No  HPI: 60 year old male history of hypertension prescribed Norvasc prior to admission, chronic back pain, prior renal calculi who experienced an out of hospital cardiac arrest with downtime of 9 minutes.  Initial rhythm was ventricular fibrillation.  He was defibrillated deceived 6 doses of epi before ROSC.  Subsequent work-up revealed STEMI.  He underwent cardiac catheterization with a DES placed to the circumflex.  Cardiology following and has placed patient on Brilinta and aspirin and is also being treated with beta-blocker and statins.  From a neurological standpoint he clinically has anoxic brain injury.  During this hospitalization he experienced AKI and was treated with fluids and correction of hypoperfusion from cardiac arrest.  Echo performed this admission revealed ischemic cardiomyopathy with an EF of 40%  In addition to above patient has been unable to pass swallowing evaluations and subsequently has undergone percutaneous PEG tube placement on 12/30.  Patient is awake and moving all extremities spontaneously but does not follow commands and does not verbalize palliative care has met with patient and family and has emphasized  poor prognosis and will continue to meet with the family during the hospitalization.  Current plan is to transition to SNF bed once available.  Expect need for lifelong 24/7 care for all ADLs.  Subjective: Sleeping and did not wish to awaken.  Objective: Vitals:   09/10/20 2033 09/11/20 0428  BP: (!) 154/94 121/88  Pulse: 90 91  Resp: 18 18  Temp: 97.7 F (36.5 C) 98 F (36.7 C)  SpO2: 98% 98%    Intake/Output Summary (Last 24 hours) at 09/11/2020 0810 Last data filed at 09/11/2020 0300 Gross per 24 hour  Intake 660 ml  Output 750 ml  Net -90 ml   Filed Weights   09/08/20 0100 09/10/20 0500 09/11/20 0428  Weight: 86 kg 85.9 kg 85.2 kg    Exam: Constitutional: Sleeping, no acute distress Cardiovascular: Heart sounds, pulses regular, extremities warm to touch Abdomen: Soft nontender nondistended with normoactive bowel sounds PEG tube.  LBM 1/23.  Has been eating between 60 to 90% of meals this morning only ate 25%. Genitourinary: Condom catheter in place with yellow to amber-colored urine Neurologic: CN 2-12 roughly without any appreciable focal neurological deficits, MOE x4, strength is 4/5.   Psychiatric: Awake, oriented to name only.  Flat affect but will smile-continues with significant short-term memory deficits   Assessment/Plan: Acute problems: Out of hospital cardiac arrest secondary to STEMI -ROSC and subsequent catheterization revealed disease in circumflex requiring DES -Continue beta-blocker, statin; Brilinta which had been held briefly for PEG tube resumed on 12/30 by  cardiology  Abnormal urinalysis -Obtained on 1/19.  Urine cloudy in appearance with large amount of hemoglobin, positive nitrate, 30 of protein and few bacteria with 0-5 WBC -Appears to be consistent with dehydration-as noted Free water has been increased as of 1/18 and patient also encouraged to increase oral intake of fluids today -No symptoms, no leukocytosis or fever.  Clean-catch urine  culture multiple species therefore recollection recommended.  On 1/24 have requested repeat urine culture catheterized  Ischemic cardiomyopathy -Echo this admission EF 40% -Continue pharmacotherapy as above -No ACE inhibitor secondary to underlying kidney disease -Continue hydralazine and Isordil -Will need follow-up echo 3 months post initial echo  Significant anoxic brain injury secondary to cardiac arrest/deconditioning/gait instability -Consistently participating with PT and OT.  SLP has cleared for dysphagia 2 with thin liquids -Awaiting confirmation from family that they can provide 24/7 care for several weeks after discharge from CIR -PT will make attempts to increase frequency of therapy including possible daily therapy alternating between PT and OT -No hypertonicity or spasticity observed -Continue Seroquel to 50 mg at HS for insomnia and brain injury sequela -Continue daytime Symmetrel to help contain daytime alertness in context of brain injury -PT session 1/18: Documented that they felt the patient was internally distracted but was unable to verbalize his thoughts or concerns.  Required 2 person moderate assist to ambulate 30 feet with hand-held guidance.  Hypertension -Was on Norvasc prior to admission -Continue cardiovascular meds as listed above  Nonsustained VT -Continue amiodarone and beta-blocker -LFTs were repeated on 1/7 with stable AST but ALT has increased from 52 to 133 -1/19 will repeat in a.m. -Cardiology managing  Acute on chronic kidney disease stage IIIb -Suspect prerenal etiology in the setting of hypoperfusion from ventricular fibrillation arrest -Baseline creatinine between 1.6 and 2; creatinine on 12/19 was 1.67-will repeat in a.m. on 08/19/2020 -Continue free water per tube and intermittently follow labs  Dysphagia, moderate protein calorie malnutrition Nutrition Problem: Inadequate oral intake Etiology: inability to eat Signs/Symptoms: NPO status   -patient now on D2 diet with thin liquids. DC tube feedings and monitor PO intake-can resume if consistently <50% -1/18 urine dark in appearance therefore free water increased to 200 cc every 2 hours.  As of 1/20 urine is more yellow in appearance.  Patient continues with variable oral intake including fluids therefore we will continue current free water administration regimen -1/24 Resume nocturnal TFs Interventions: Tube feeding,Prostat Estimated body mass index is 25.47 kg/m as calculated from the following:   Height as of this encounter: 6' (1.829 m).   Weight as of this encounter: 85.2 kg.  Skin tear right lateral back Wound / Incision (Open or Dehisced) 07/28/20 Skin tear Back Lateral;Right;Upper (Active)  Date First Assessed/Time First Assessed: 07/28/20 0300   Wound Type: Skin tear  Location: Back  Location Orientation: Lateral;Right;Upper  Present on Admission: No    Assessments 07/28/2020  7:49 PM 09/03/2020  9:00 PM  Dressing Type None Other (Comment)  Dressing Status - None  Drainage Amount None -  Treatment Cleansed -     No Linked orders to display    Other problems: Acute hypernatremia -Continue free water per tube  Hyperglycemia without an underlying diagnosis of diabetes mellitus Resolved -Likely related to acute stress -Recent issues with hypoglycemia so sliding scale insulin decreased to very sensitive and Levemir decreased from 30 twice daily to 15 twice daily as of 1/14 -Hemoglobin A1c 4.7  -1/18 refusing CBG checks so TF held-will stop TF and dc  Levemir and SSI. If TF resumed can resume SSI and CBG checks -Serum glucose on 1/19 was 103  Acute hypoxemic respiratory failure -Resolved -Stable on room air with O2 sats between 97 and 99%   Data Reviewed: Basic Metabolic Panel: Recent Labs  Lab 09/06/20 0506 09/07/20 0516 09/08/20 0407 09/09/20 0504  NA 140 137 137 138  K 3.7 4.0 3.8 3.9  CL 105 102 102 105  CO2 _0 20*  GLUCOSE 103* 123* 107*  109*  BUN 24* _1 CREATININE 1.40* 1.40* 1.38* 1.45*  CALCIUM 9.5 9.6 9.5 9.5   Liver Function Tests: Recent Labs  Lab 09/07/20 0516  AST 25  ALT 76*  ALKPHOS 79  BILITOT 0.7  PROT 7.1  ALBUMIN 3.4*   No results for input(s): LIPASE, AMYLASE in the last 168 hours. No results for input(s): AMMONIA in the last 168 hours. CBC: No results for input(s): WBC, NEUTROABS, HGB, HCT, MCV, PLT in the last 168 hours. Cardiac Enzymes: No results for input(s): CKTOTAL, CKMB, CKMBINDEX, TROPONINI in the last 168 hours. BNP (last 3 results) No results for input(s): BNP in the last 8760 hours.  ProBNP (last 3 results) No results for input(s): PROBNP in the last 8760 hours.  CBG: Recent Labs  Lab 09/04/20 1302 09/04/20 1659 09/04/20 2117 09/05/20 0004 09/05/20 0543  GLUCAP 112* 100* 107* 114* 106*    Recent Results (from the past 240 hour(s))  Culture, Urine     Status: Abnormal   Collection Time: 09/06/20  7:51 PM   Specimen: Urine, Random  Result Value Ref Range Status   Specimen Description URINE, RANDOM  Final   Special Requests   Final    NONE Performed at [facility], [address], [place], [place] [address]    Culture MULTIPLE SPECIES PRESENT, SUGGEST RECOLLECTION (A)  Final   Report Status 09/09/2020 FINAL  Final     Studies: No results found.  Scheduled Meds: . amantadine  100 mg Per Tube Daily  . aspirin  81 mg Per Tube Daily  . atorvastatin  80 mg Per Tube Daily  . bethanechol  10 mg Per Tube TID  . carvedilol  25 mg Per Tube BID WC  . chlorhexidine gluconate (MEDLINE KIT)  15 mL Mouth Rinse BID  . feeding supplement (PROSource TF)  45 mL Per Tube TID  . free water  200 mL Per Tube Q2H  . heparin  5,000 Units Subcutaneous Q8H  . isosorbide-hydrALAZINE  1 tablet Per Tube TID  . mouth rinse  15 mL Mouth Rinse BID  . polyvinyl alcohol  1 drop Both Eyes QID  . QUEtiapine  50 mg Per Tube QHS  . sodium chloride flush  3 mL Intravenous Q12H  .  ticagrelor  90 mg Per Tube BID   Continuous Infusions: . sodium chloride 250 mL (08/17/20 7026)    Active Problems:   Acute ST elevation myocardial infarction (STEMI) (HCC)   Cardiac arrest (HCC)   Elevated blood pressure reading with diagnosis of hypertension   Shock circulatory (HCC)   Encephalopathy   History of ETT   Cardiomyopathy, ischemic   Anoxic brain damage [facility])   Dysphagia   DNR (do not resuscitate) discussion   Gait instability   Consultants:  Cardiology  Interventional radiology  PCCM  Neurology  Procedures:  Cardiac catheterization 12/4  Continuous EEG 12/4 and overnight EEG with video 12/5  Echocardiogram 12/5  EEG 12/9  Percutaneous PEG tube placement  12/30  Antibiotics: Anti-infectives (From admission, onward)   Start     Dose/Rate Route Frequency Ordered Stop   08/17/20 0000  ceFAZolin (ANCEF) IVPB 2g/100 mL premix        2 g 200 mL/hr over 30 Minutes Intravenous To Radiology 08/15/20 1346 08/17/20 1111   07/22/20 0830  cefTRIAXone (ROCEPHIN) 2 g in sodium chloride 0.9 % 100 mL IVPB        2 g 200 mL/hr over 30 Minutes Intravenous Every 24 hours 07/22/20 0720 07/26/20 0858       Time spent: 20 minutes    Erin Hearing ANP  [facility] 7 am - 330 pm/M-F for direct patient care and secure chat Please refer to Amion for contact information 51 days

## 2020-09-11 NOTE — Progress Notes (Signed)
Calorie Count Note  48 hour calorie count ordered.  Diet: dysphagia 2 diet with thin liquids Supplements: Magic cup TID with meals, each supplement provides 290 kcal and 9 grams of protein  Pt sleeping in bed at time of visit. He did not arouse to name being called.   Case discussed with Erin Hearing, NP with hospitalist team, and reviewed results of calorie count. She is agreeable to resuming nocturnal feedings. Communicated with RN regarding change in care plan.   1/20 Breakfast: 573 kcals, 29 grams protein Lunch: 414 kcals, 15 grams protein Dinner: 430 kcals, 21 grams protein  Total intake: 1417 kcal (62% of minimum estimated needs)  65 grams protein (57% of minimum estimated needs)  1/21-1/22 Breakfast: 430 kcals, 24 grams protein Lunch: 428 kcals, 23 grams protein Dinner: 429 kcals, 22 grams protein  Total intake: 1287 kcal (56% of minimum estimated needs)  69 grams protein (60% of minimum estimated needs)  09/10/20 Breakfast: 307 kcals, 12 grams protein Lunch: 320 kcals, 14 grams protein Dinner: 744 kcals, 31 grams protein  Total intake: 1371 kcal (60% of minimum estimated needs)  57 grams protein (50% of minimum estimated needs)  Average Total intake: 1358 kcal (60% of minimum estimated needs)  64 grams protein (55% of minimum estimated needs)  Nutrition Dx: Inadequate oral intakerelated to inability to eatas evidenced by NPO status.  Progressing; advanced to dysphagia 2 diet on 08/29/20  Goal: Patient will meet greater than or equal to 90% of their needs; unmet  Progressing  Intervention:   -D/c calorie count -ContinueMagic cup TID with meals, each supplement provides 290 kcal and 9 grams of protein -Continue feeding assistance with meals -Resume nocturnal feedings:   Initiate Jevity 1.5 @ 65 ml/hr via PEG over 12 hour period  45 ml Prosource TF BID    Continue 200 ml free water flush every 3 hours  Tube feeding regimen provides 1250  kcal (54% of needs), 72 grams of protein, and 593 ml of H2O. Total free water: 2193 ml daily  Loistine Chance, RD, LDN, [place] Registered Dietitian II Certified Diabetes Care and Education Specialist Please refer to [facility] for RD and/or RD on-call/weekend/after hours pager

## 2020-09-12 NOTE — Progress Notes (Signed)
  Speech Language Pathology Treatment: Dysphagia;Cognitive-Linquistic  Patient Details Name: Antonio Navarro MRN: [id number] DOB: Jan 29, 1961 Today's Date: 09/12/2020 Time: 4268-3419 SLP Time Calculation (min) (ACUTE ONLY): 14 min  Assessment / Plan / Recommendation Clinical Impression  Pt was resting upon SLP arrival and arouses easily but with frequent cues needed for initiation. He followed one-step commands intermittently with Mod multimodal cues provided. He does not make choices between a field of two today, but does respond ~75% of the time to yes/no questions (although with questions for which accuracy cannot be judged). He did nod yes/no accurately when given choices for what his sisters' names are. Pt consumed thin liquids only, declining more solid foods offered. An immediate cough was noted when pt started moving his head before he swallowed, but with no overt s/s of aspiration noted once his head was better maintained in a neutral position. He appears to have been tolerating current diet, although still question his nutritional intake. Would continue with Dys 2 diet and thin liquids for now.    HPI HPI: Pt is a 60 yo male s/p arrest with downtime 9 minutes. Initial rhythm was V. fib, shocked and received 6 rounds of epi before ROSC achieved.  Total CPR time was 30 minutes. ETT 12/4-12/13. MRI 12/7 suggestive of a widespread anoxic injury. PMH includes kidney stones and HTN.      SLP Plan  Continue with current plan of care       Recommendations  Diet recommendations: Dysphagia 2 (fine chop);Thin liquid Liquids provided via: Cup;Straw Medication Administration: Via alternative means Supervision: Staff to assist with self feeding;Full supervision/cueing for compensatory strategies Compensations: Minimize environmental distractions;Slow rate;Small sips/bites Postural Changes and/or Swallow Maneuvers: Seated upright 90 degrees                Oral Care Recommendations: Oral care  BID Follow up Recommendations: Skilled Nursing facility SLP Visit Diagnosis: Dysphagia, oral phase (R13.11) Plan: Continue with current plan of care       GO                Osie Bond., M.A. [place] [facility] Pager [phone] Office [phone]  09/12/2020, 9:19 AM

## 2020-09-12 NOTE — Progress Notes (Signed)
Palliative Medicine RN Note: Chart check done. Note that pt is still waiting on bed placement. At this time, there are no PMT needs, and we will continue to shadow his progress.  Marjie Skiff Nasir Bright, RN, BSN, [facility] Palliative Medicine Team 09/12/2020 11:26 AM Office [phone]

## 2020-09-12 NOTE — Progress Notes (Signed)
PROGRESS NOTE    Antonio Navarro  AOZ:[id number] DOB: 08/16/1961 DOA: 07/22/2020 PCP: Patient, No Pcp Per    Brief Narrative:  Antonio Navarro is a 60 year old male with past medical history significant for essential hypertension, chronic back pain, history of nephrolithiasis who presented to the ED following out of hospital cardiac arrest with downtime of 9 minutes.  Initial rhythm documented was ventricular fibrillation and patient was defibrillated and received 6 doses of epinephrine prior to achieving ROSC.  He underwent cardiac catheterization with placement of a DES to circumflex.  Hospital course has been complicated by his encephalopathy likely secondary to anoxic brain injury with associated dysphagia and AKI from poor oral intake requiring percutaneous PEG tube placement on 08/17/2020.  Palliative care has been following given his overall poor prognosis with the current plan to transition to skilled nursing facility once available.  He is expected to require lifelong 24/7 care for his activities of daily living.   Assessment & Plan:   Active Problems:   Acute ST elevation myocardial infarction (STEMI) (HCC)   Cardiac arrest (HCC)   Elevated blood pressure reading with diagnosis of hypertension   Shock circulatory (HCC)   Encephalopathy   History of ETT   Cardiomyopathy, ischemic   Anoxic brain damage ([place])   Dysphagia   DNR (do not resuscitate) discussion   Gait instability   Out of hospital cardiac arrest Ventricular fibrillation STEMI Ischemic cardiomyopathy Patient presented following out of hospital cardiac arrest status post defibrillation for ventricular fibrillation.  Underwent left heart catheterization with DES to circumflex.  TTE with LVEF 40% --Continue beta-blocker, statin, Brilinta, hydralazine and Isordil --Aspirin/statin --Plan follow-up TTE 3 months following stent placement to assess for LVEF recovery --Outpatient follow-up with cardiology following  discharge  Encephalopathy secondary to anoxic brain injury from cardiac arrest and prolonged downtime --Seroquel 50 mg qHS --Supportive care --Pending placement as patient will require lifelong assistance with ADLs  Dysphagia Moderate protein calorie malnutrition Body mass index is 25.59 kg/m. Nutrition Status: Nutrition Problem: Inadequate oral intake Etiology: inability to eat Signs/Symptoms: NPO status Interventions: Tube feeding,Prostat --Dietitian following, appreciate assistance. --Dysphagia 2 diet, continues on nocturnal tube feeds     DVT prophylaxis: Heparin Code Status: Full code Family Communication: No family present at bedside  Disposition Plan:  Level of care: Med-Surg Status is: Inpatient  Remains inpatient appropriate because:Unsafe d/c plan   Dispo: The patient is from: Home              Anticipated d/c is to: SNF              Anticipated d/c date is: 1 day              Patient currently is medically stable to d/c.   Difficult to place patient Yes   Consultants:   PCCM  Cardiology  Procedures:   DES to 95% circumflex stenosis 07/22/2020  Echo 12/5>>EF 96-29%, grade 1 diastolic dysfunction  EEG 12/9>> diffuse encephalopathy but no seizure activity.  Antimicrobials:   Ceftriaxone 12/4 - 12/8  Cefazolin 12/28 - 12/30    Subjective: Patient seen and examined bedside, sleeping but easily arousable.  No complaints.  No concerns overnight per nursing staff.  Difficult placement.  Objective: Vitals:   09/12/20 0046 09/12/20 0346 09/12/20 0527 09/12/20 0740  BP:  (!) 142/84  121/86  Pulse:  100  (!) 102  Resp:  18  18  Temp: 99 F (37.2 C) 98 F (36.7 C)  TempSrc: Oral Oral    SpO2:  99%  99%  Weight:   85.6 kg   Height:        Intake/Output Summary (Last 24 hours) at 09/12/2020 1014 Last data filed at 09/12/2020 0834 Gross per 24 hour  Intake 363 ml  Output 1650 ml  Net -1287 ml   Filed Weights   09/10/20 0500 09/11/20  0428 09/12/20 0527  Weight: 85.9 kg 85.2 kg 85.6 kg    Examination:  General exam: Appears calm and comfortable  Respiratory system: Clear to auscultation. Respiratory effort normal.  Oxygenating well on room air Cardiovascular system: S1 & S2 heard, RRR. No JVD, murmurs, rubs, gallops or clicks. No pedal edema. Gastrointestinal system: Abdomen is nondistended, soft and nontender. No organomegaly or masses felt. Normal bowel sounds heard.  Noted PEG tube Central nervous system: Alert.  Moving all extremities independently Extremities: Symmetric 5 x 5 power. Skin: No rashes, lesions or ulcers Psychiatry: Judgement and insight appear poor.     Data Reviewed: I have personally reviewed following labs and imaging studies  CBC: No results for input(s): WBC, NEUTROABS, HGB, HCT, MCV, PLT in the last 168 hours. Basic Metabolic Panel: Recent Labs  Lab 09/06/20 0506 09/07/20 0516 09/08/20 0407 09/09/20 0504  NA 140 137 137 138  K 3.7 4.0 3.8 3.9  CL 105 102 102 105  CO2 _0 20*  GLUCOSE 103* 123* 107* 109*  BUN 24* _1 CREATININE 1.40* 1.40* 1.38* 1.45*  CALCIUM 9.5 9.6 9.5 9.5   GFR: Estimated Creatinine Clearance: 61 mL/min (A) (by C-G formula based on SCr of 1.45 mg/dL (H)). Liver Function Tests: Recent Labs  Lab 09/07/20 0516  AST 25  ALT 76*  ALKPHOS 79  BILITOT 0.7  PROT 7.1  ALBUMIN 3.4*   No results for input(s): LIPASE, AMYLASE in the last 168 hours. No results for input(s): AMMONIA in the last 168 hours. Coagulation Profile: No results for input(s): INR, PROTIME in the last 168 hours. Cardiac Enzymes: No results for input(s): CKTOTAL, CKMB, CKMBINDEX, TROPONINI in the last 168 hours. BNP (last 3 results) No results for input(s): PROBNP in the last 8760 hours. HbA1C: No results for input(s): HGBA1C in the last 72 hours. CBG: No results for input(s): GLUCAP in the last 168 hours. Lipid Profile: No results for input(s): CHOL, HDL, LDLCALC, TRIG,  CHOLHDL, LDLDIRECT in the last 72 hours. Thyroid Function Tests: No results for input(s): TSH, T4TOTAL, FREET4, T3FREE, THYROIDAB in the last 72 hours. Anemia Panel: No results for input(s): VITAMINB12, FOLATE, FERRITIN, TIBC, IRON, RETICCTPCT in the last 72 hours. Sepsis Labs: No results for input(s): PROCALCITON, LATICACIDVEN in the last 168 hours.  Recent Results (from the past 240 hour(s))  Culture, Urine     Status: Abnormal   Collection Time: 09/06/20  7:51 PM   Specimen: Urine, Random  Result Value Ref Range Status   Specimen Description URINE, RANDOM  Final   Special Requests   Final    NONE Performed at [facility], [address] [place], [place] [address]    Culture MULTIPLE SPECIES PRESENT, SUGGEST RECOLLECTION (A)  Final   Report Status 09/09/2020 FINAL  Final         Radiology Studies: No results found.      Scheduled Meds: . amantadine  100 mg Per Tube Daily  . aspirin  81 mg Per Tube Daily  . atorvastatin  80 mg Per Tube Daily  . bethanechol  10 mg Per Tube TID  . carvedilol  25 mg Per Tube BID WC  . chlorhexidine gluconate (MEDLINE KIT)  15 mL Mouth Rinse BID  . feeding supplement (PROSource TF)  45 mL Per Tube BID  . free water  200 mL Per Tube Q2H  . heparin  5,000 Units Subcutaneous Q8H  . isosorbide-hydrALAZINE  1 tablet Per Tube TID  . mouth rinse  15 mL Mouth Rinse BID  . polyvinyl alcohol  1 drop Both Eyes QID  . QUEtiapine  50 mg Per Tube QHS  . sodium chloride flush  3 mL Intravenous Q12H  . ticagrelor  90 mg Per Tube BID   Continuous Infusions: . sodium chloride 250 mL (08/17/20 0812)  . feeding supplement (JEVITY 1.5 CAL/FIBER) 780 mL (09/11/20 2008)     LOS: 52 days    Time spent: 28 minutes spent on chart review, discussion with nursing staff, consultants, updating family and interview/physical exam; more than 50% of that time was spent in counseling and/or coordination of care.    Alias Villagran J [place] DO [facility] Available via Epic secure chat 7am-7pm After these hours, please refer to coverage provider listed on [email or web address] 09/12/2020, 10:14 AM

## 2020-09-13 DIAGNOSIS — Z7189 Other specified counseling: Secondary | ICD-10-CM

## 2020-09-13 DIAGNOSIS — I2129 ST elevation (STEMI) myocardial infarction involving other sites: Secondary | ICD-10-CM

## 2020-09-13 DIAGNOSIS — R579 Shock, unspecified: Secondary | ICD-10-CM

## 2020-09-13 DIAGNOSIS — I255 Ischemic cardiomyopathy: Secondary | ICD-10-CM

## 2020-09-13 LAB — URINE CULTURE
Culture: >=100000 — AB
Special Requests: NORMAL

## 2020-09-13 MED ORDER — SODIUM CHLORIDE 0.9 % IV SOLN
1.0000 g | INTRAVENOUS | Status: DC
Start: 2020-09-13 — End: 2020-09-13

## 2020-09-13 MED ORDER — SULFAMETHOXAZOLE-TRIMETHOPRIM 200-40 MG/5ML PO SUSP
20.0000 mL | Freq: Two times a day (BID) | ORAL | Status: AC
  Administered 2020-09-13 – 2020-09-19 (×13): 20 mL
  Filled 2020-09-13 (×13): qty 20

## 2020-09-13 NOTE — TOC Progression Note (Signed)
Transition of Care [facility]) - Progression Note    Patient Details  Name: Antonio Navarro MRN: [id number] Date of Birth: 01-27-1961  Transition of Care [facility]) CM/SW [place], RN Phone Number: 09/13/2020, 1:33 PM  Clinical Narrative:    Case management called and left a message with Gayla Medicus, RNCM with [facility] to have the facility view the patient's clinicals for possible admission and acceptance of LOG from [facility].  CM will continue to follow the patient for [facility] admission.   Expected Discharge Plan: Skilled Nursing Facility Barriers to Discharge: Continued Medical Work up,No SNF bed  Expected Discharge Plan and Services Expected Discharge Plan: [place] arrangements for the past 2 months: Single Family Home                     Social Determinants of Health (SDOH) Interventions    Readmission Risk Interventions No flowsheet data found.

## 2020-09-13 NOTE — Progress Notes (Signed)
PROGRESS NOTE    Antonio Navarro  FAO:[id number] DOB: 1961/04/23 DOA: 07/22/2020 PCP: Patient, No Pcp Per    Brief Narrative:  Antonio Navarro is a 60 year old male with past medical history significant for essential hypertension, chronic back pain, history of nephrolithiasis who presented to the ED following out of hospital cardiac arrest with downtime of 9 minutes.  Initial rhythm documented was ventricular fibrillation and patient was defibrillated and received 6 doses of epinephrine prior to achieving ROSC.  He underwent cardiac catheterization with placement of a DES to circumflex.  Hospital course has been complicated by his encephalopathy likely secondary to anoxic brain injury with associated dysphagia and AKI from poor oral intake requiring percutaneous PEG tube placement on 08/17/2020.  Palliative care has been following given his overall poor prognosis with the current plan to transition to skilled nursing facility once available.  He is expected to require lifelong 24/7 care for his activities of daily living.   Assessment & Plan:   Active Problems:   Acute ST elevation myocardial infarction (STEMI) (HCC)   Cardiac arrest (HCC)   Elevated blood pressure reading with diagnosis of hypertension   Shock circulatory (HCC)   Encephalopathy   History of ETT   Cardiomyopathy, ischemic   Anoxic brain damage ([place])   Dysphagia   DNR (do not resuscitate) discussion   Gait instability   Out of hospital cardiac arrest Ventricular fibrillation STEMI Ischemic cardiomyopathy Patient presented following out of hospital cardiac arrest status post defibrillation for ventricular fibrillation.  Underwent left heart catheterization with DES to circumflex.  TTE with LVEF 40% --Continue beta-blocker, statin, Brilinta, hydralazine and Isordil --Aspirin/statin --Plan follow-up TTE 3 months following stent placement to assess for LVEF recovery --Outpatient follow-up with cardiology following  discharge  Encephalopathy secondary to anoxic brain injury from cardiac arrest and prolonged downtime --Seroquel 50 mg qHS --Supportive care --Pending placement as patient will require lifelong assistance with ADLs  E. coli UTI --Bactrim q12h x 7 days  Dysphagia Moderate protein calorie malnutrition Body mass index is 26.04 kg/m. Nutrition Status: Nutrition Problem: Inadequate oral intake Etiology: inability to eat Signs/Symptoms: NPO status Interventions: Tube feeding,Prostat --Dietitian following, appreciate assistance. --Dysphagia 2 diet, continues on nocturnal tube feeds     DVT prophylaxis: Heparin Code Status: Full code Family Communication: No family present at bedside  Disposition Plan:  Level of care: Med-Surg Status is: Inpatient  Remains inpatient appropriate because:Unsafe d/c plan   Dispo: The patient is from: Home              Anticipated d/c is to: SNF              Anticipated d/c date is: 1 day              Patient currently is medically stable to d/c.   Difficult to place patient Yes   Consultants:   PCCM  Cardiology  Procedures:   DES to 95% circumflex stenosis 07/22/2020  Echo 12/5>>EF 69-62%, grade 1 diastolic dysfunction  EEG 12/9>> diffuse encephalopathy but no seizure activity.  Antimicrobials:   Ceftriaxone 12/4 - 12/8  Cefazolin 12/28 - 12/30    Subjective: Patient seen and examined bedside, watching TV this morning.  No complaints.  Urine culture returned back greater than 100 K E. coli, starting on antibiotics.  No concerns overnight per nursing staff.  Difficult placement.  Objective: Vitals:   09/12/20 2038 09/13/20 0337 09/13/20 0409 09/13/20 0827  BP: 120/81 107/74  134/77  Pulse: (!) 105 Marland Kitchen)  102  96  Resp: 18 16    Temp: 99.3 F (37.4 C) 97.8 F (36.6 C)    TempSrc: Oral     SpO2:  97%    Weight:   87.1 kg   Height:        Intake/Output Summary (Last 24 hours) at 09/13/2020 1006 Last data filed at  09/13/2020 0525 Gross per 24 hour  Intake 948 ml  Output 2750 ml  Net -1802 ml   Filed Weights   09/11/20 0428 09/12/20 0527 09/13/20 0409  Weight: 85.2 kg 85.6 kg 87.1 kg    Examination:  General exam: Appears calm and comfortable  Respiratory system: Clear to auscultation. Respiratory effort normal.  Oxygenating well on room air Cardiovascular system: S1 & S2 heard, RRR. No JVD, murmurs, rubs, gallops or clicks. No pedal edema. Gastrointestinal system: Abdomen is nondistended, soft and nontender. No organomegaly or masses felt. Normal bowel sounds heard.  Noted PEG tube Central nervous system: Alert.  Moving all extremities independently Extremities: Symmetric 5 x 5 power. Skin: No rashes, lesions or ulcers Psychiatry: Judgement and insight appear poor.     Data Reviewed: I have personally reviewed following labs and imaging studies  CBC: No results for input(s): WBC, NEUTROABS, HGB, HCT, MCV, PLT in the last 168 hours. Basic Metabolic Panel: Recent Labs  Lab 09/07/20 0516 09/08/20 0407 09/09/20 0504  NA 137 137 138  K 4.0 3.8 3.9  CL 102 102 105  CO2 26 23 20*  GLUCOSE 123* 107* 109*  BUN 20 20 19  CREATININE 1.40* 1.38* 1.45*  CALCIUM 9.6 9.5 9.5   GFR: Estimated Creatinine Clearance: 61 mL/min (A) (by C-G formula based on SCr of 1.45 mg/dL (H)). Liver Function Tests: Recent Labs  Lab 09/07/20 0516  AST 25  ALT 76*  ALKPHOS 79  BILITOT 0.7  PROT 7.1  ALBUMIN 3.4*   No results for input(s): LIPASE, AMYLASE in the last 168 hours. No results for input(s): AMMONIA in the last 168 hours. Coagulation Profile: No results for input(s): INR, PROTIME in the last 168 hours. Cardiac Enzymes: No results for input(s): CKTOTAL, CKMB, CKMBINDEX, TROPONINI in the last 168 hours. BNP (last 3 results) No results for input(s): PROBNP in the last 8760 hours. HbA1C: No results for input(s): HGBA1C in the last 72 hours. CBG: No results for input(s): GLUCAP in the last  168 hours. Lipid Profile: No results for input(s): CHOL, HDL, LDLCALC, TRIG, CHOLHDL, LDLDIRECT in the last 72 hours. Thyroid Function Tests: No results for input(s): TSH, T4TOTAL, FREET4, T3FREE, THYROIDAB in the last 72 hours. Anemia Panel: No results for input(s): VITAMINB12, FOLATE, FERRITIN, TIBC, IRON, RETICCTPCT in the last 72 hours. Sepsis Labs: No results for input(s): PROCALCITON, LATICACIDVEN in the last 168 hours.  Recent Results (from the past 240 hour(s))  Culture, Urine     Status: Abnormal   Collection Time: 09/06/20  7:51 PM   Specimen: Urine, Random  Result Value Ref Range Status   Specimen Description URINE, RANDOM  Final   Special Requests   Final    NONE Performed at [facility], [address] [place], [place] [address]    Culture MULTIPLE SPECIES PRESENT, SUGGEST RECOLLECTION (A)  Final   Report Status 09/09/2020 FINAL  Final  Urine Culture     Status: Abnormal   Collection Time: 09/11/20  8:10 AM   Specimen: Urine, Catheterized  Result Value Ref Range Status   Specimen Description URINE, CATHETERIZED  Final     Special Requests   Final    Normal Performed at [facility], [place] [address] [place], [place] [address]    Culture >=100,000 COLONIES/mL ESCHERICHIA COLI (A)  Final   Report Status 09/13/2020 FINAL  Final   Organism ID, Bacteria ESCHERICHIA COLI (A)  Final      Susceptibility   Escherichia coli - MIC*    AMPICILLIN >=32 RESISTANT Resistant     CEFAZOLIN >=64 RESISTANT Resistant     CEFEPIME <=0.12 SENSITIVE Sensitive     CEFTRIAXONE 32 RESISTANT Resistant     CIPROFLOXACIN <=0.25 SENSITIVE Sensitive     GENTAMICIN <=1 SENSITIVE Sensitive     IMIPENEM <=0.25 SENSITIVE Sensitive     NITROFURANTOIN 32 SENSITIVE Sensitive     TRIMETH/SULFA <=20 SENSITIVE Sensitive     AMPICILLIN/SULBACTAM >=32 RESISTANT Resistant     * >=100,000 COLONIES/mL ESCHERICHIA COLI         Radiology Studies: No results found.      Scheduled  Meds: . amantadine  100 mg Per Tube Daily  . aspirin  81 mg Per Tube Daily  . atorvastatin  80 mg Per Tube Daily  . bethanechol  10 mg Per Tube TID  . carvedilol  25 mg Per Tube BID WC  . chlorhexidine gluconate (MEDLINE KIT)  15 mL Mouth Rinse BID  . feeding supplement (PROSource TF)  45 mL Per Tube BID  . free water  200 mL Per Tube Q2H  . heparin  5,000 Units Subcutaneous Q8H  . isosorbide-hydrALAZINE  1 tablet Per Tube TID  . mouth rinse  15 mL Mouth Rinse BID  . polyvinyl alcohol  1 drop Both Eyes QID  . QUEtiapine  50 mg Per Tube QHS  . sodium chloride flush  3 mL Intravenous Q12H  . sulfamethoxazole-trimethoprim  20 mL Per Tube Q12H  . ticagrelor  90 mg Per Tube BID   Continuous Infusions: . sodium chloride 250 mL (08/17/20 0812)  . feeding supplement (JEVITY 1.5 CAL/FIBER) 780 mL (09/12/20 2000)     LOS: 53 days    Time spent: 26 minutes spent on chart review, discussion with nursing staff, consultants, updating family and interview/physical exam; more than 50% of that time was spent in counseling and/or coordination of care.    Doak Mah J [place] DO [facility] Available via Epic secure chat 7am-7pm After these hours, please refer to coverage provider listed on [email or web address] 09/13/2020, 10:06 AM

## 2020-09-13 NOTE — Progress Notes (Signed)
Occupational Therapy Treatment Patient Details Name: Antonio Navarro MRN: [id number] DOB: 11/20/1960 Today's Date: 09/13/2020    History of present illness [name] with history of tobacco abuse, obesity, HTN who was admitted 07/22/20 post cardiac arrest with inferolateral STEMI. He was resuscitated in the field by EMS after being found to be in V fib. Approximately 30 minutes of CPR. He was intubated on arrival.  Pt with anoxic brain injury. PEG placed on 08/17/20   OT comments  Pt seen in conjunction with PT to maximize pt activity tolerance and optimize pts participation. Pt making improvements with following commands, proprioception and motor planning. pt able to complete household distance functional mobility with MIN A +2 and  Able to complete UB ADLs at sink with MIN A +2 for standing balance. Pt would continue to benefit from skilled occupational therapy while admitted and after d/c to address the below listed limitations in order to improve overall functional mobility and facilitate independence with BADL participation. DC plan remains appropriate, will follow acutely per POC.    Follow Up Recommendations  SNF;Supervision/Assistance - 24 hour    Equipment Recommendations  Wheelchair (measurements OT);Wheelchair cushion (measurements OT);Hospital bed;3 in 1 bedside commode    Recommendations for Other Services      Precautions / Restrictions Precautions Precautions: Fall;Other (comment) Precaution Comments: mitts, gtube Restrictions Weight Bearing Restrictions: No       Mobility Bed Mobility Overal bed mobility: Needs Assistance Bed Mobility: Supine to Sit;Sit to Supine     Supine to sit: Mod assist Sit to supine: Min guard   General bed mobility comments: ModA for initiation of BLE's off edge of bed, cues for use of rail. Pt returning self back into bed; min guard for safety.  Transfers Overall transfer level: Needs assistance Equipment used: 2 person hand held  assist Transfers: Sit to/from Stand Sit to Stand: Min assist;+2 physical assistance         General transfer comment: MinA + 2 to rise to stand multiple times from edge of bed and recliner. Cues provided for reciprocal scooting and initiation.    Balance Overall balance assessment: Needs assistance Sitting-balance support: No upper extremity supported;Feet unsupported Sitting balance-Leahy Scale: Fair     Standing balance support: Bilateral upper extremity supported Standing balance-Leahy Scale: Poor Standing balance comment: Able to stand at sink and wash face, but requiring min guard-minA with gait belt without hand support. Noted bilateral knee hyperextension                           ADL either performed or assessed with clinical judgement   ADL Overall ADL's : Needs assistance/impaired     Grooming: Wash/dry face;Standing;Minimal assistance Grooming Details (indicate cue type and reason): MIN A for standing balance at sink, pt reaching forward to splash water on face vs using wash cloth         Upper Body Dressing : Minimal assistance;Sitting Upper Body Dressing Details (indicate cue type and reason): to don gown as back side cover Lower Body Dressing: Maximal assistance;Bed level Lower Body Dressing Details (indicate cue type and reason): to don socks however pt attempting to life extremity for OTA to don socks Toilet Transfer: Minimal assistance;+2 for physical assistance;+2 for safety/equipment;Ambulation Toilet Transfer Details (indicate cue type and reason): simulated via functional mobility with MIN A +2         Functional mobility during ADLs: Minimal assistance;+2 for physical assistance;+2 for safety/equipment General  ADL Comments: pt making improvements with following commands, proprioception and motor planning. pt able to complete household distance functional mobility and UB ADLs at sink     Vision       Perception     Praxis       Cognition Arousal/Alertness: Awake/alert Behavior During Therapy: Flat affect Overall Cognitive Status: Impaired/Different from baseline Area of Impairment: Following commands;Safety/judgement;Problem solving;Awareness;Attention                   Current Attention Level: Sustained Memory: Decreased recall of precautions;Decreased short-term memory Following Commands: Follows one step commands inconsistently;Follows one step commands with increased time Safety/Judgement: Decreased awareness of safety;Decreased awareness of deficits Awareness: Intellectual Problem Solving: Slow processing;Difficulty sequencing;Requires verbal cues;Requires tactile cues General Comments: Pt with improved command following i.e. able to don mask, wash face at sink. Fairly consistent with yes or no responses but limited verbalizations otherwise. Often sighing during session; needs cues for self regulation during activities.        Exercises     Shoulder Instructions       General Comments      Pertinent Vitals/ Pain       Pain Assessment: Faces Faces Pain Scale: No hurt  Home Living                                          Prior Functioning/Environment              Frequency  Min 2X/week        Progress Toward Goals  OT Goals(current goals can now be found in the care plan section)  Progress towards OT goals: Progressing toward goals  Acute Rehab OT Goals Patient Stated Goal: family asking to get stronger Time For Goal Achievement: 09/13/20 Potential to Achieve Goals: [place] Discharge plan remains appropriate;Frequency remains appropriate    Co-evaluation      Reason for Co-Treatment: Necessary to address cognition/behavior during functional activity;For patient/therapist safety;To address functional/ADL transfers PT goals addressed during session: Mobility/safety with mobility OT goals addressed during session: ADL's and self-care       AM-PAC OT "6 Clicks" Daily Activity     Outcome Measure   Help from another person eating meals?: A Lot Help from another person taking care of personal grooming?: A Little Help from another person toileting, which includes using toliet, bedpan, or urinal?: A Lot Help from another person bathing (including washing, rinsing, drying)?: A Lot Help from another person to put on and taking off regular upper body clothing?: A Lot Help from another person to put on and taking off regular lower body clothing?: A Lot 6 Click Score: 13    End of Session Equipment Utilized During Treatment: Gait belt  OT Visit Diagnosis: Muscle weakness (generalized) (M62.81);Pain;Other abnormalities of gait and mobility (R26.89);Cognitive communication deficit (R41.841);Low vision, both eyes (H54.2) Symptoms and signs involving cognitive functions: Other Nontraumatic ICH   Activity Tolerance Patient tolerated treatment well   Patient Left in bed;with call bell/phone within reach;with bed alarm set;with restraints reapplied;Other (comment) (mitts applied)   Nurse Communication          Time: [phone] OT Time Calculation (min): 26 min  Charges: OT General Charges $OT Visit: 1 Visit OT Treatments $Self Care/Home Management : 8-22 mins  [name]., COTA/L Acute Rehabilitation Services [phone]    Precious Haws 09/13/2020, 9:56  AM    

## 2020-09-13 NOTE — Progress Notes (Signed)
Physical Therapy Treatment Patient Details Name: Antonio Navarro MRN: [id number] DOB: 1961/06/02 Today's Date: 09/13/2020    History of Present Illness [name] with history of tobacco abuse, obesity, HTN who was admitted 07/22/20 post cardiac arrest with inferolateral STEMI. He was resuscitated in the field by EMS after being found to be in V fib. Approximately 30 minutes of CPR. He was intubated on arrival.  Pt with anoxic brain injury. PEG placed on 08/17/20    PT Comments    Pt demonstrating improved activity tolerance, command following, and mobility today. Requiring two person minimal assist for functional mobility. Ambulating x 140 feet with handheld guidance. Able to participate in standing dynamic balance task at sink washing face. Continues with cognitive deficits, decreased standing balance, decreased endurance, and weakness. Continue to recommend SNF for ongoing Physical Therapy.     Follow Up Recommendations  SNF     Equipment Recommendations  None recommended by PT    Recommendations for Other Services       Precautions / Restrictions Precautions Precautions: Fall;Other (comment) Precaution Comments: mitts, gtube Restrictions Weight Bearing Restrictions: No    Mobility  Bed Mobility Overal bed mobility: Needs Assistance Bed Mobility: Supine to Sit;Sit to Supine     Supine to sit: Mod assist Sit to supine: Min guard   General bed mobility comments: ModA for initiation of BLE's off edge of bed, cues for use of rail. Pt returning self back into bed; min guard for safety.  Transfers Overall transfer level: Needs assistance Equipment used: 2 person hand held assist Transfers: Sit to/from Stand Sit to Stand: Min assist;+2 physical assistance         General transfer comment: MinA + 2 to rise to stand multiple times from edge of bed and recliner. Cues provided for reciprocal scooting and initiation.  Ambulation/Gait Ambulation/Gait assistance: +2  physical assistance;Min assist Gait Distance (Feet): 140 Feet Assistive device: 2 person hand held assist Gait Pattern/deviations: Step-through pattern;Decreased stride length;Narrow base of support;Trunk flexed Gait velocity: decreased   General Gait Details: Pt with increased anterior weight shift, narrow BOS, and difficulty controlling momentum. Handheld assist provided for guidance. Cues provided for self regulation. MinA + 2 for stability.   Stairs             Wheelchair Mobility    Modified Rankin (Stroke Patients Only)       Balance Overall balance assessment: Needs assistance Sitting-balance support: No upper extremity supported;Feet unsupported Sitting balance-Leahy Scale: Fair     Standing balance support: Bilateral upper extremity supported Standing balance-Leahy Scale: Poor Standing balance comment: Able to stand at sink and wash face, but requiring min guard-minA with gait belt without hand support. Noted bilateral knee hyperextension                            Cognition Arousal/Alertness: Awake/alert Behavior During Therapy: Flat affect Overall Cognitive Status: Impaired/Different from baseline Area of Impairment: Following commands;Safety/judgement;Problem solving                   Current Attention Level: Sustained Memory: Decreased recall of precautions;Decreased short-term memory Following Commands: Follows one step commands inconsistently Safety/Judgement: Decreased awareness of safety;Decreased awareness of deficits Awareness: Intellectual Problem Solving: Slow processing;Difficulty sequencing;Requires verbal cues;Requires tactile cues General Comments: Pt with improved command following i.e. able to don mask, wash face at sink. Fairly consistent with yes or no responses but limited verbalizations otherwise. Often sighing during  session; needs cues for self regulation during activities.      Exercises      General Comments         Pertinent Vitals/Pain Pain Assessment: Faces Faces Pain Scale: No hurt    Home Living                      Prior Function            PT Goals (current goals can now be found in the care plan section) Acute Rehab PT Goals Potential to Achieve Goals: Fair Progress towards PT goals: Progressing toward goals    Frequency    Min 3X/week      PT Plan Current plan remains appropriate    Co-evaluation PT/OT/SLP Co-Evaluation/Treatment: Yes Reason for Co-Treatment: Necessary to address cognition/behavior during functional activity;For patient/therapist safety;To address functional/ADL transfers PT goals addressed during session: Mobility/safety with mobility        AM-PAC PT "6 Clicks" Mobility   Outcome Measure  Help needed turning from your back to your side while in a flat bed without using bedrails?: A Little Help needed moving from lying on your back to sitting on the side of a flat bed without using bedrails?: A Lot Help needed moving to and from a bed to a chair (including a wheelchair)?: A Little Help needed standing up from a chair using your arms (e.g., wheelchair or bedside chair)?: A Little Help needed to walk in hospital room?: A Little Help needed climbing 3-5 steps with a railing? : A Lot 6 Click Score: 16    End of Session Equipment Utilized During Treatment: Gait belt Activity Tolerance: Patient tolerated treatment well Patient left: in bed;with call bell/phone within reach;with bed alarm set Nurse Communication: Mobility status PT Visit Diagnosis: Other abnormalities of gait and mobility (R26.89)     Time: [id number] PT Time Calculation (min) (ACUTE ONLY): 26 min  Charges:  $Therapeutic Activity: 8-22 mins                     Wyona Almas, PT, DPT Acute Rehabilitation Services Pager [phone] Office [phone]    Deno Etienne 09/13/2020, 9:33 AM

## 2020-09-14 LAB — CBC
HCT: 36.6 % — ABNORMAL LOW (ref 39.0–52.0)
Hemoglobin: 11.8 g/dL — ABNORMAL LOW (ref 13.0–17.0)
MCH: 32.1 pg (ref 26.0–34.0)
MCHC: 32.2 g/dL (ref 30.0–36.0)
MCV: 99.5 fL (ref 80.0–100.0)
Platelets: 206 10*3/uL (ref 150–400)
RBC: 3.68 MIL/uL — ABNORMAL LOW (ref 4.22–5.81)
RDW: 13.1 % (ref 11.5–15.5)
WBC: 12.1 10*3/uL — ABNORMAL HIGH (ref 4.0–10.5)
nRBC: 0 % (ref 0.0–0.2)

## 2020-09-14 LAB — URINALYSIS, COMPLETE (UACMP) WITH MICROSCOPIC
Bilirubin Urine: NEGATIVE
Glucose, UA: NEGATIVE mg/dL
Hgb urine dipstick: NEGATIVE
Ketones, ur: NEGATIVE mg/dL
Nitrite: NEGATIVE
Protein, ur: NEGATIVE mg/dL
Specific Gravity, Urine: 1.005 (ref 1.005–1.030)
pH: 6 (ref 5.0–8.0)

## 2020-09-14 LAB — BASIC METABOLIC PANEL
Anion gap: 10 (ref 5–15)
BUN: 13 mg/dL (ref 6–20)
CO2: 25 mmol/L (ref 22–32)
Calcium: 9.1 mg/dL (ref 8.9–10.3)
Chloride: 99 mmol/L (ref 98–111)
Creatinine, Ser: 1.27 mg/dL — ABNORMAL HIGH (ref 0.61–1.24)
GFR, Estimated: >60 mL/min (ref >60)
Glucose, Bld: 179 mg/dL — ABNORMAL HIGH (ref 70–99)
Potassium: 3.9 mmol/L (ref 3.5–5.1)
Sodium: 134 mmol/L — ABNORMAL LOW (ref 135–145)

## 2020-09-14 LAB — GLUCOSE, CAPILLARY: Glucose-Capillary: 152 mg/dL — ABNORMAL HIGH (ref 70–99)

## 2020-09-14 NOTE — Progress Notes (Signed)
Nutrition Follow-up  DOCUMENTATION CODES:   Not applicable  INTERVENTION:   -ContinueMagic cup TID with meals, each supplement provides 290 kcal and 9 grams of protein -Continue feeding assistance with meals -Continue nocturnal feedings:   Jevity 1.5@ 65ml/hr via PEG over 12 hour period  41ml Prosource TF BID   Continue 200 ml free water flush every 3 hours  Tube feeding regimen provides1250kcal (54% of needs),72grams of protein, and 560ml of H2O. Total free water: 2193 ml daily  NUTRITION DIAGNOSIS:   Inadequate oral intake related to inability to eat as evidenced by NPO status.  Progressing; advanced to dysphagia 2 diet on 08/29/20  GOAL:   Patient will meet greater than or equal to 90% of their needs  Progressing   MONITOR:   Diet advancement,Labs,Weight trends,TF tolerance,Skin,I & O's  REASON FOR ASSESSMENT:   Consult,Ventilator Enteral/tube feeding initiation and management  ASSESSMENT:   Patient with PMH significant for HTN and kidney stones. Presents this admission s/p cardiac arrest.  12/13- NGT d/c 12/15- cortrak placed, tip of tube in the stomach 12/17- s/p BSE- recommend continue NPO 12/19- pt pulled cortrak tube, replaced- placement verified by x-ray (stomach) 12/20- s/p BSE- pt refusing PO trials 12/30- cortrak removed, PEG placed 1/5- Advanced to thin liquid diet 1/6- Advanced to full liquid diet 1/11- Advanced to dysphagia 2 diet 1/14- transitioned to nocturnal feedings by MD 1/18- TF d/c by MD 1/24- calorie cont completed- pt consuming about 50% of needs PO; nocturnal feedings resumrf  Reviewed I/O's: -2.4 L x 24 hours and -1.4 L since 08/31/20  UOP: 2.6 L x 24 hours  Pt remains with erratic intake. Noted meal completion 0-75%. Nocturnal feedings resumed on 09/11/20; pt tolerating well.   Per chart review, pt continues to await SNF placement. He is medically stable for discharge.   Labs reviewed: Na: 134.   Diet Order:    Diet Order            DIET DYS 2 Room service appropriate? No; Fluid consistency: Thin  Diet effective now                 EDUCATION NEEDS:   Not appropriate for education at this time  Skin:  Skin Assessment: Skin Integrity Issues: Skin Integrity Issues:: Other (Comment) Other: MASD to buttocks, skin tear to rt upper back  Last BM:  09/14/20  Height:   Ht Readings from Last 1 Encounters:  09/03/20 6' (1.829 m)    Weight:   Wt Readings from Last 1 Encounters:  09/14/20 87.1 kg    Ideal Body Weight:  80.9 kg  BMI:  Body mass index is 26.04 kg/m.  Estimated Nutritional Needs:   Kcal:  2300-2500 kcal  Protein:  115-130 grams  Fluid:  >/= 2 L/day    Loistine Chance, RD, LDN, [place] Registered Dietitian II Certified Diabetes Care and Education Specialist Please refer to [facility] for RD and/or RD on-call/weekend/after hours pager

## 2020-09-14 NOTE — Progress Notes (Signed)
[facility] PROGRESS NOTE  TAKARI LUNDAHL ZOX:[id number] DOB: 08/09/1961 DOA: 07/22/2020 PCP: Patient, No Pcp Per       Status: Remains inpatient appropriate because:Altered mental status and Unsafe d/c plan   Dispo: The patient is from: Home              Anticipated d/c is to: SNF              Anticipated d/c date is: > 3 days              Patient currently is medically stable to d/c.  Barriers to discharge: Medicaid and disability pending.Family unable to provide adequate care 24/7 after dc so declined by CIR-focus is now on SNF   Code Status: Full Family Communication: 1/06 spoke with patient's sister [name] -1/24 DVT prophylaxis: sq heparin Vaccination status: Completed both doses of Pfizer Covid vaccination on 12/14/2019  Foley catheter: No  HPI: 60 year old male history of hypertension prescribed Norvasc prior to admission, chronic back pain, prior renal calculi who experienced an out of hospital cardiac arrest with downtime of 9 minutes.  Initial rhythm was ventricular fibrillation.  He was defibrillated deceived 6 doses of epi before ROSC.  Subsequent work-up revealed STEMI.  He underwent cardiac catheterization with a DES placed to the circumflex.  Cardiology following and has placed patient on Brilinta and aspirin and is also being treated with beta-blocker and statins.  From a neurological standpoint he clinically has anoxic brain injury.  During this hospitalization he experienced AKI and was treated with fluids and correction of hypoperfusion from cardiac arrest.  Echo performed this admission revealed ischemic cardiomyopathy with an EF of 40%  In addition to above patient has been unable to pass swallowing evaluations and subsequently has undergone percutaneous PEG tube placement on 12/30.  Patient is awake and moving all extremities spontaneously but does not follow commands and does not verbalize palliative care has met with patient and family and has emphasized  poor prognosis and will continue to meet with the family during the hospitalization.  Current plan is to transition to SNF bed once available.  Expect need for lifelong 24/7 care for all ADLs.  Subjective: Sleeping.  Briefly awakened but did not stay awake.  Did have a rough night and was awake on and off last night.  Objective: Vitals:   09/14/20 0628 09/14/20 0747  BP: (!) 98/53 122/84  Pulse: 92 89  Resp: 16 20  Temp: (!) 101.1 F (38.4 C) 97.7 F (36.5 C)  SpO2: 99%     Intake/Output Summary (Last 24 hours) at 09/14/2020 0814 Last data filed at 09/13/2020 2235 Gross per 24 hour  Intake 240 ml  Output 2600 ml  Net -2360 ml   Filed Weights   09/13/20 0409 09/13/20 2300 09/14/20 0623  Weight: 87.1 kg 88.4 kg 87.1 kg    Exam: Constitutional: Sleepy and briefly awakens.  Calm.  No acute distress Cardiovascular: Normal heart sounds, no tachycardia, extremities warm to touch with adequate capillary refill Abdomen: Abdomen soft nontender nondistended with normoactive bowel sounds.  Nocturnal tube feeding infusing via PEG.  LBM 1/27  Genitourinary: Condom catheter in place with yellow to amber-colored urine Neurologic: CN 2-12 roughly without any appreciable focal neurological deficits, MOE x4, strength is 4/5.   Psychiatric: Sleeping.  Did not verbally responsive unable to accurately assess orientation status at this time   Assessment/Plan: Acute problems: Out of hospital cardiac arrest secondary to STEMI -ROSC and subsequent catheterization revealed disease in  circumflex requiring DES -Continue beta-blocker, statin; Brilinta which had been held briefly for PEG tube resumed on 12/30 by cardiology  E. coli UTI -Obtained on 1/19.  Urine cloudy in appearance with large amount of hemoglobin, positive nitrate, 30 of protein and few bacteria with 0-5 WBC -Resistant to ampicillin, Unasyn, cefazolin and ceftriaxone -On Bactrim on 1/26 -Initially was asymptomatic but later developed  low-grade fevers and subsequently recurrent fevers overnight despite being on antibiotic -Obtain blood cultures   Ischemic cardiomyopathy -Echo this admission EF 40% -Continue pharmacotherapy as above -No ACE inhibitor secondary to underlying kidney disease -Continue hydralazine and Isordil -Will need follow-up echo 3 months post initial echo  Significant anoxic brain injury secondary to cardiac arrest/deconditioning/gait instability -Consistently participating with PT and OT.  SLP has cleared for dysphagia 2 with thin liquids -Declined by CIR since family unable to provide 24/7 care.  Plan is for SNF -Continue Seroquel to 50 mg at HS for insomnia and brain injury sequela -Continue daytime Symmetrel to help contain daytime alertness in context of brain injury -PT session 1/26: Improved activity tolerance, command following immobility.  Require 2 person minimal assist for functional mobility.  Ambulating 140 feet with hand-held guidance.  Continues with cognitive deficits, decreased standing balance, decreased endurance and weakness with recommendation to continue with SNF placement  Hypertension -Was on Norvasc prior to admission -Continue cardiovascular meds as listed above  Nonsustained VT -Continue amiodarone and beta-blocker -LFTs were repeated on 1/7 with stable AST but ALT has increased from 52 to 133 -1/19 will repeat in a.m. -Cardiology managing  Acute on chronic kidney disease stage IIIb -Suspect prerenal etiology in the setting of hypoperfusion from ventricular fibrillation arrest -Baseline creatinine between 1.6 and 2; creatinine on 12/19 was 1.67-will repeat in a.m. on 08/19/2020 -Continue free water per tube and intermittently follow labs  Dysphagia, moderate protein calorie malnutrition Nutrition Problem: Inadequate oral intake Etiology: inability to eat Signs/Symptoms: NPO status  -patient now on D2 diet with thin liquids. DC tube feedings and monitor PO intake-can  resume if consistently <50% -1/18 urine dark in appearance therefore free water increased to 200 cc every 2 hours.  As of 1/20 urine is more yellow in appearance.  Patient continues with variable oral intake including fluids therefore we will continue current free water administration regimen -1/24 Resume nocturnal TFs Interventions: Tube feeding,Prostat Estimated body mass index is 26.04 kg/m as calculated from the following:   Height as of this encounter: 6' (1.829 m).   Weight as of this encounter: 87.1 kg.  Skin tear right lateral back Wound / Incision (Open or Dehisced) 07/28/20 Skin tear Back Lateral;Right;Upper (Active)  Date First Assessed/Time First Assessed: 07/28/20 0300   Wound Type: Skin tear  Location: Back  Location Orientation: Lateral;Right;Upper  Present on Admission: No    Assessments 07/28/2020  7:49 PM 09/13/2020  8:27 AM  Dressing Type None None  Drainage Amount None -  Treatment Cleansed -     No Linked orders to display    Other problems: Acute hypernatremia -Continue free water per tube  Hyperglycemia without an underlying diagnosis of diabetes mellitus Resolved -Likely related to acute stress -Recent issues with hypoglycemia so sliding scale insulin decreased to very sensitive and Levemir decreased from 30 twice daily to 15 twice daily as of 1/14 -Hemoglobin A1c 4.7  -1/18 refusing CBG checks so TF held-will stop TF and dc Levemir and SSI. If TF resumed can resume SSI and CBG checks -Serum glucose on 1/19 was 103  Acute hypoxemic respiratory failure -Resolved -Stable on room air with O2 sats between 97 and 99%   Data Reviewed: Basic Metabolic Panel: Recent Labs  Lab 09/08/20 0407 09/09/20 0504  NA 137 138  K 3.8 3.9  CL 102 105  CO2 23 20*  GLUCOSE 107* 109*  BUN 20 19  CREATININE 1.38* 1.45*  CALCIUM 9.5 9.5   Liver Function Tests: No results for input(s): AST, ALT, ALKPHOS, BILITOT, PROT, ALBUMIN in the last 168 hours. No results for  input(s): LIPASE, AMYLASE in the last 168 hours. No results for input(s): AMMONIA in the last 168 hours. CBC: No results for input(s): WBC, NEUTROABS, HGB, HCT, MCV, PLT in the last 168 hours. Cardiac Enzymes: No results for input(s): CKTOTAL, CKMB, CKMBINDEX, TROPONINI in the last 168 hours. BNP (last 3 results) No results for input(s): BNP in the last 8760 hours.  ProBNP (last 3 results) No results for input(s): PROBNP in the last 8760 hours.  CBG: No results for input(s): GLUCAP in the last 168 hours.  Recent Results (from the past 240 hour(s))  Culture, Urine     Status: Abnormal   Collection Time: 09/06/20  7:51 PM   Specimen: Urine, Random  Result Value Ref Range Status   Specimen Description URINE, RANDOM  Final   Special Requests   Final    NONE Performed at [facility], [address] [place], [place] [address]    Culture MULTIPLE SPECIES PRESENT, SUGGEST RECOLLECTION (A)  Final   Report Status 09/09/2020 FINAL  Final  Urine Culture     Status: Abnormal   Collection Time: 09/11/20  8:10 AM   Specimen: Urine, Catheterized  Result Value Ref Range Status   Specimen Description URINE, CATHETERIZED  Final   Special Requests   Final    Normal Performed at [facility], [place] [address], [place], [place] [address]    Culture >=100,000 COLONIES/mL ESCHERICHIA COLI (A)  Final   Report Status 09/13/2020 FINAL  Final   Organism ID, Bacteria ESCHERICHIA COLI (A)  Final      Susceptibility   Escherichia coli - MIC*    AMPICILLIN >=32 RESISTANT Resistant     CEFAZOLIN >=64 RESISTANT Resistant     CEFEPIME <=0.12 SENSITIVE Sensitive     CEFTRIAXONE 32 RESISTANT Resistant     CIPROFLOXACIN <=0.25 SENSITIVE Sensitive     GENTAMICIN <=1 SENSITIVE Sensitive     IMIPENEM <=0.25 SENSITIVE Sensitive     NITROFURANTOIN 32 SENSITIVE Sensitive     TRIMETH/SULFA <=20 SENSITIVE Sensitive     AMPICILLIN/SULBACTAM >=32 RESISTANT Resistant     * >=100,000 COLONIES/mL  ESCHERICHIA COLI     Studies: No results found.  Scheduled Meds: . amantadine  100 mg Per Tube Daily  . aspirin  81 mg Per Tube Daily  . atorvastatin  80 mg Per Tube Daily  . bethanechol  10 mg Per Tube TID  . carvedilol  25 mg Per Tube BID WC  . chlorhexidine gluconate (MEDLINE KIT)  15 mL Mouth Rinse BID  . feeding supplement (PROSource TF)  45 mL Per Tube BID  . free water  200 mL Per Tube Q2H  . heparin  5,000 Units Subcutaneous Q8H  . isosorbide-hydrALAZINE  1 tablet Per Tube TID  . mouth rinse  15 mL Mouth Rinse BID  . polyvinyl alcohol  1 drop Both Eyes QID  . QUEtiapine  50 mg Per Tube QHS  . sodium chloride flush  3 mL Intravenous  Q12H  . sulfamethoxazole-trimethoprim  20 mL Per Tube Q12H  . ticagrelor  90 mg Per Tube BID   Continuous Infusions: . sodium chloride 250 mL (08/17/20 0812)  . feeding supplement (JEVITY 1.5 CAL/FIBER) 780 mL (09/12/20 2000)    Active Problems:   Acute ST elevation myocardial infarction (STEMI) [facility])   Cardiac arrest (HCC)   Elevated blood pressure reading with diagnosis of hypertension   Shock circulatory (HCC)   Encephalopathy   History of ETT   Cardiomyopathy, ischemic   Anoxic brain damage [facility])   Dysphagia   DNR (do not resuscitate) discussion   Gait instability   Consultants:  Cardiology  Interventional radiology  PCCM  Neurology  Procedures:  Cardiac catheterization 12/4  Continuous EEG 12/4 and overnight EEG with video 12/5  Echocardiogram 12/5  EEG 12/9  Percutaneous PEG tube placement 12/30  Antibiotics: Anti-infectives (From admission, onward)   Start     Dose/Rate Route Frequency Ordered Stop   09/13/20 1000  sulfamethoxazole-trimethoprim (BACTRIM) 200-40 MG/5ML suspension 20 mL        20 mL Per Tube Every 12 hours 09/13/20 0734 09/20/20 0959   09/13/20 0745  cefTRIAXone (ROCEPHIN) 1 g in sodium chloride 0.9 % 100 mL IVPB  Status:  Discontinued        1 g 200 mL/hr over 30 Minutes Intravenous  Every 24 hours 09/13/20 0658 09/13/20 0734   08/17/20 0000  ceFAZolin (ANCEF) IVPB 2g/100 mL premix        2 g 200 mL/hr over 30 Minutes Intravenous To Radiology 08/15/20 1346 08/17/20 1111   07/22/20 0830  cefTRIAXone (ROCEPHIN) 2 g in sodium chloride 0.9 % 100 mL IVPB        2 g 200 mL/hr over 30 Minutes Intravenous Every 24 hours 07/22/20 0720 07/26/20 0858       Time spent: 20 minutes    Erin Hearing ANP  [facility] 7 am - 330 pm/M-F for direct patient care and secure chat Please refer to Amion for contact information 54 days

## 2020-09-14 NOTE — TOC Progression Note (Signed)
Transition of Care [facility]) - Progression Note    Patient Details  Name: GEORDAN XU MRN: [id number] Date of Birth: 01-22-1961  Transition of Care [facility]) CM/SW [place], RN Phone Number: 09/14/2020, 1:35 PM  Clinical Narrative:    Gayla Medicus, CM and director with [facility] declined patient for admission to the facility.  CM and MSW with Difficult to Place Team will continue to follow the patient for SNF placement.   Expected Discharge Plan: Skilled Nursing Facility Barriers to Discharge: Continued Medical Work up,No SNF bed  Expected Discharge Plan and Services Expected Discharge Plan: [place] arrangements for the past 2 months: Single Family Home                                       Social Determinants of Health (SDOH) Interventions    Readmission Risk Interventions No flowsheet data found.

## 2020-09-14 NOTE — Progress Notes (Addendum)
  Speech Language Pathology Treatment: Dysphagia;Cognitive-Linquistic  Patient Details Name: Antonio Navarro MRN: [id number] DOB: [birth year] Today's Date: 09/14/2020 Time: 1520-1540 SLP Time Calculation (min) (ACUTE ONLY): 20 min  Assessment / Plan / Recommendation Clinical Impression  Pt seen at bedside for skilled ST intervention targeting goals for attention, direction following, communication of wants and needs, diet tolerance and readiness to advance. Pt was awake, resting in bed upon arrival of SLP. No family present. Pt made eye contact when his name was said, but otherwise did not make or sustain eye contact. Pt was able to answer several yes/no questions (answered "yes" to "are you hurting", but was unable to indicate severity or location). He was able to state his first and last name, but was unable to indicate year or month of birth, despite cues. Moderate multimodal cues were utilized to encourage responses in general. Pt was unable to follow verbal commands consistently. SLP will continue to follow to address cognitive-communication skills and determine readiness to advance textures. Pt appears to tolerate current diet (Dys2), with extended oral prep noted. No overt s/s aspiration observed. Safe swallow precautions posted at [facility].    HPI HPI: Pt is a 60 yo male s/p arrest with downtime 9 minutes. Initial rhythm was V. fib, shocked and received 6 rounds of epi before ROSC achieved.  Total CPR time was 30 minutes. ETT 12/4-12/13. MRI 12/7 suggestive of a widespread anoxic injury. PMH includes kidney stones and HTN.      SLP Plan  Continue with current plan of care       Recommendations  Diet recommendations: Dysphagia 2 (fine chop);Thin liquid Liquids provided via: Cup;Straw Medication Administration: Via alternative means Supervision: Staff to assist with self feeding;Full supervision/cueing for compensatory strategies Compensations: Minimize environmental distractions;Slow  rate;Small sips/bites Postural Changes and/or Swallow Maneuvers: Seated upright 90 degrees                Oral Care Recommendations: Oral care BID SLP Visit Diagnosis: Dysphagia, oral phase (R13.11);Cognitive communication deficit (R41.841) Plan: Continue with current plan of care       Sherman. Quentin Ore, [facility], [place] Speech Language Pathologist Office: [phone] Pager: [phone]  Shonna Chock 09/14/2020, 3:45 PM

## 2020-09-14 NOTE — Progress Notes (Addendum)
HOSPITAL MEDICINE OVERNIGHT EVENT NOTE    Notified by nursing the patient is exhibiting a fever of 101.1 Fahrenheit.  No obvious complaints.  Chart reviewed, patient suffers from sequela of anoxic brain injury from recent V. fib arrest status post cardiac catheterization with DES to circumflex.  Patient is additionally being initiated on antibiotics for an E. coli UTI.  Patient has only received 24 hrs of abx it seems.    We will treat fever with as needed Tylenol, monitor for fever recurrence.    Vernelle Emerald  MD [facility]

## 2020-09-15 ENCOUNTER — Inpatient Hospital Stay (HOSPITAL_COMMUNITY): Payer: Medicaid Other

## 2020-09-15 LAB — GLUCOSE, CAPILLARY
Glucose-Capillary: 178 mg/dL — ABNORMAL HIGH (ref 70–99)
Glucose-Capillary: 198 mg/dL — ABNORMAL HIGH (ref 70–99)

## 2020-09-15 IMAGING — DX DG ABDOMEN 1V
1 series · 1 of 1 positions shown · non-contrast
Comparison: [DATE]

CLINICAL DATA: Abdominal pain PEG tube

EXAM:
ABDOMEN - 1 VIEW

[abdomen supine]
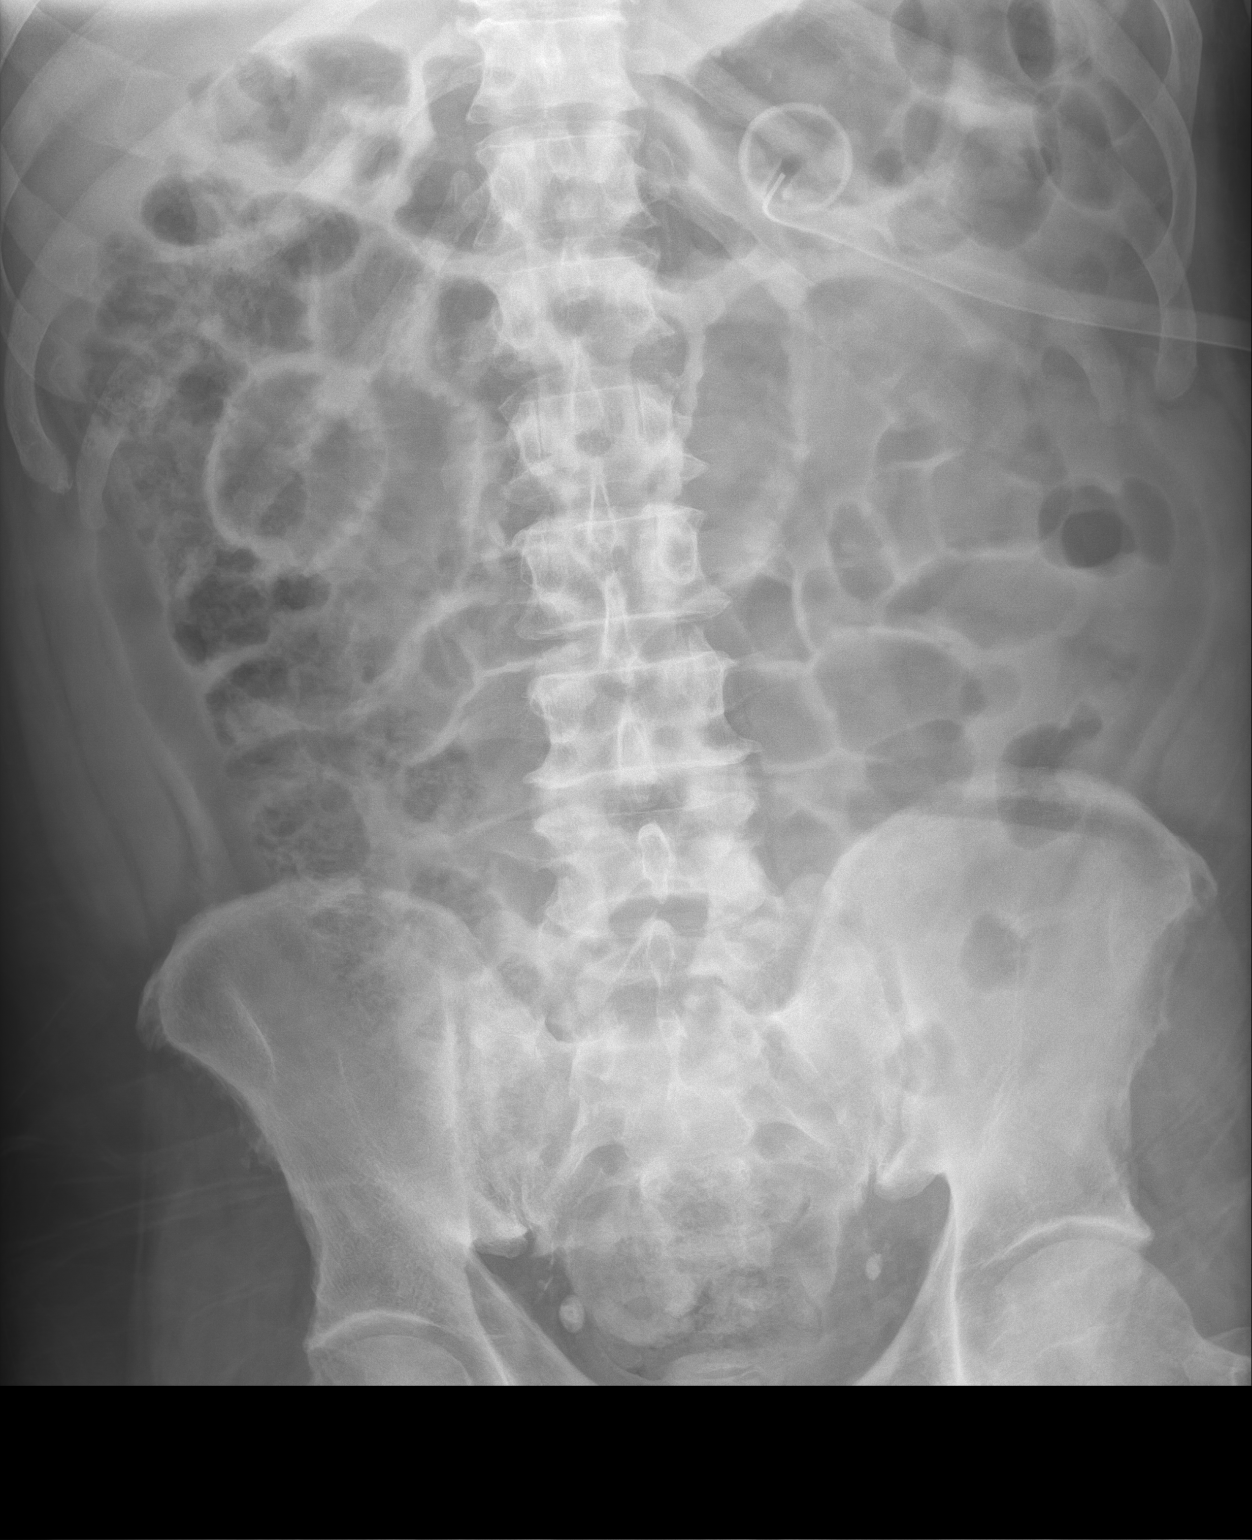

[1 of 1 positions shown; findings below may reference images not displayed]

FINDINGS: Gastrostomy tube in the left upper quadrant. Mild diffuse increased
small and large bowel gas without obstructive pattern. Probable
phleboliths in the pelvis.
IMPRESSION: Mild diffuse increased small and large bowel gas without obstructive
pattern. Findings could be secondary to mild ileus or enteritis.

## 2020-09-15 NOTE — Progress Notes (Signed)
[facility] PROGRESS NOTE  Antonio Navarro OMB:[id number] DOB: 05-22-1961 DOA: 07/22/2020 PCP: Patient, No Pcp Per       Status: Remains inpatient appropriate because:Altered mental status and Unsafe d/c plan   Dispo: The patient is from: Home              Anticipated d/c is to: SNF              Anticipated d/c date is: > 3 days              Patient currently is medically stable to d/c.  Barriers to discharge: Medicaid and disability pending.Family unable to provide adequate care 24/7 after dc so declined by CIR-focus is now on SNF   Code Status: Full Family Communication: 1/06 spoke with patient's sister [name] -1/24 DVT prophylaxis: sq heparin Vaccination status: Completed both doses of Pfizer Covid vaccination on 12/14/2019  Foley catheter: No  HPI: 60 year old male history of hypertension prescribed Norvasc prior to admission, chronic back pain, prior renal calculi who experienced an out of hospital cardiac arrest with downtime of 9 minutes.  Initial rhythm was ventricular fibrillation.  He was defibrillated deceived 6 doses of epi before ROSC.  Subsequent work-up revealed STEMI.  He underwent cardiac catheterization with a DES placed to the circumflex.  Cardiology following and has placed patient on Brilinta and aspirin and is also being treated with beta-blocker and statins.  From a neurological standpoint he clinically has anoxic brain injury.  During this hospitalization he experienced AKI and was treated with fluids and correction of hypoperfusion from cardiac arrest.  Echo performed this admission revealed ischemic cardiomyopathy with an EF of 40%  In addition to above patient has been unable to pass swallowing evaluations and subsequently has undergone percutaneous PEG tube placement on 12/30.  Patient is awake and moving all extremities spontaneously but does not follow commands and does not verbalize palliative care has met with patient and family and has emphasized  poor prognosis and will continue to meet with the family during the hospitalization.  Current plan is to transition to SNF bed once available.  Expect need for lifelong 24/7 care for all ADLs.  Subjective: Awake.  Essentially nonconversant although when repeatedly prompted will softly state "yes".  No specific complaints.  Continues to have difficulty with maintaining eye contact and attention.  Objective: Vitals:   09/14/20 2123 09/15/20 0458  BP: 126/89 111/84  Pulse: 84 92  Resp: 18 16  Temp: 98.2 F (36.8 C) 99.7 F (37.6 C)  SpO2: 100% 97%    Intake/Output Summary (Last 24 hours) at 09/15/2020 4536 Last data filed at 09/15/2020 0809 Gross per 24 hour  Intake 560 ml  Output 1950 ml  Net -1390 ml   Filed Weights   09/14/20 0623 09/15/20 0050 09/15/20 0500  Weight: 87.1 kg 88.2 kg 88.2 kg    Exam: Constitutional: Awake, calm, no acute distress Cardiovascular: Heart sounds S1-S2, no peripheral edema, extremities warm to touch with appropriate capillary refill, pulse regular nontachycardic Abdomen: PEG for nocturnal tube feedings and medications.  With poor oral intake of diet.  LBM 1/27  Genitourinary: Condom catheter urine noted Neurologic: CN 2-12 roughly without any appreciable focal neurological deficits, MOE x4, strength is 4/5.   Psychiatric: A, difficulty maintaining eye contact and has poor attention.  Was unaware he was at the hospital even with prompting of "you are laying in a hospital bed, what kind of place is this"   Assessment/Plan: Acute problems: Out  of hospital cardiac arrest secondary to STEMI -ROSC and subsequent catheterization revealed disease in circumflex requiring DES -Continue beta-blocker, statin; Brilinta which had been held briefly for PEG tube resumed on 12/30 by cardiology  E. coli UTI -Obtained on 1/19.  Urine cloudy in appearance with large amount of hemoglobin, positive nitrate, 30 of protein and few bacteria with 0-5 WBC -Resistant to  ampicillin, Unasyn, cefazolin and ceftriaxone -On Bactrim on 1/26 -Initially was asymptomatic but later developed low-grade fevers and subsequently recurrent fevers overnight despite being on antibiotic -Blood cultures pending-NGTD  Ischemic cardiomyopathy -Echo this admission EF 40% -Continue pharmacotherapy as above -No ACE inhibitor secondary to underlying kidney disease -Continue hydralazine and Isordil -Will need follow-up echo 3 months post initial echo  Significant anoxic brain injury secondary to cardiac arrest/deconditioning/gait instability -Consistently participating with PT and OT.  SLP has cleared for dysphagia 2 with thin liquids -Declined by CIR since family unable to provide 24/7 care.  Plan is for SNF -Continue Seroquel to 50 mg at HS for insomnia and brain injury sequela -Continue daytime Symmetrel to help contain daytime alertness in context of brain injury -PT session 1/26: Improved activity tolerance, command following immobility.  Require 2 person minimal assist for functional mobility.  Ambulating 140 feet with hand-held guidance.  Continues with cognitive deficits, decreased standing balance, decreased endurance and weakness with recommendation to continue with SNF placement  Hypertension -Was on Norvasc prior to admission -Continue cardiovascular meds as listed above  Nonsustained VT -Continue amiodarone and beta-blocker -LFTs were repeated on 1/7 with stable AST but ALT has increased from 52 to 133 -1/19 will repeat in a.m. -Cardiology managing  Acute on chronic kidney disease stage IIIb -Suspect prerenal etiology in the setting of hypoperfusion from ventricular fibrillation arrest -Baseline creatinine between 1.6 and 2; creatinine on 12/19 was 1.67-will repeat in a.m. on 08/19/2020 -Continue free water per tube and intermittently follow labs  Dysphagia, moderate protein calorie malnutrition Nutrition Problem: Inadequate oral intake Etiology: inability to  eat Signs/Symptoms: NPO status  -patient now on D2 diet with thin liquids. DC tube feedings and monitor PO intake-can resume if consistently <50% -1/18 urine dark in appearance therefore free water increased to 200 cc every 2 hours.  As of 1/20 urine is more yellow in appearance.  Patient continues with variable oral intake including fluids therefore we will continue current free water administration regimen -1/24 Resume nocturnal TFs Interventions: Tube feeding,Prostat Estimated body mass index is 26.37 kg/m as calculated from the following:   Height as of this encounter: 6' (1.829 m).   Weight as of this encounter: 88.2 kg.  Skin tear right lateral back Wound / Incision (Open or Dehisced) 07/28/20 Skin tear Back Lateral;Right;Upper (Active)  Date First Assessed/Time First Assessed: 07/28/20 0300   Wound Type: Skin tear  Location: Back  Location Orientation: Lateral;Right;Upper  Present on Admission: No    Assessments 07/28/2020  7:49 PM 09/14/2020  8:29 AM  Dressing Type None Foam - Lift dressing to assess site every shift  Drainage Amount None -  Treatment Cleansed -     No Linked orders to display    Other problems: Acute hypernatremia -Continue free water per tube  Hyperglycemia without an underlying diagnosis of diabetes mellitus Resolved -Likely related to acute stress -Recent issues with hypoglycemia so sliding scale insulin decreased to very sensitive and Levemir decreased from 30 twice daily to 15 twice daily as of 1/14 -Hemoglobin A1c 4.7  -1/18 refusing CBG checks so TF held-will stop TF  and dc Levemir and SSI. If TF resumed can resume SSI and CBG checks -Serum glucose on 1/19 was 103  Acute hypoxemic respiratory failure -Resolved -Stable on room air with O2 sats between 97 and 99%   Data Reviewed: Basic Metabolic Panel: Recent Labs  Lab 09/09/20 0504 09/14/20 0731  NA 138 134*  K 3.9 3.9  CL 105 99  CO2 20* 25  GLUCOSE 109* 179*  BUN 19 13  CREATININE  1.45* 1.27*  CALCIUM 9.5 9.1   Liver Function Tests: No results for input(s): AST, ALT, ALKPHOS, BILITOT, PROT, ALBUMIN in the last 168 hours. No results for input(s): LIPASE, AMYLASE in the last 168 hours. No results for input(s): AMMONIA in the last 168 hours. CBC: Recent Labs  Lab 09/14/20 0731  WBC 12.1*  HGB 11.8*  HCT 36.6*  MCV 99.5  PLT 206   Cardiac Enzymes: No results for input(s): CKTOTAL, CKMB, CKMBINDEX, TROPONINI in the last 168 hours. BNP (last 3 results) No results for input(s): BNP in the last 8760 hours.  ProBNP (last 3 results) No results for input(s): PROBNP in the last 8760 hours.  CBG: Recent Labs  Lab 09/14/20 2149 09/15/20 0502  GLUCAP 152* 178*    Recent Results (from the past 240 hour(s))  Culture, Urine     Status: Abnormal   Collection Time: 09/06/20  7:51 PM   Specimen: Urine, Random  Result Value Ref Range Status   Specimen Description URINE, RANDOM  Final   Special Requests   Final    NONE Performed at [facility], [place] [address], [place], [place] [address]    Culture MULTIPLE SPECIES PRESENT, SUGGEST RECOLLECTION (A)  Final   Report Status 09/09/2020 FINAL  Final  Urine Culture     Status: Abnormal   Collection Time: 09/11/20  8:10 AM   Specimen: Urine, Catheterized  Result Value Ref Range Status   Specimen Description URINE, CATHETERIZED  Final   Special Requests   Final    Normal Performed at [facility], [place] [address], [place], [place] [address]    Culture >=100,000 COLONIES/mL ESCHERICHIA COLI (A)  Final   Report Status 09/13/2020 FINAL  Final   Organism ID, Bacteria ESCHERICHIA COLI (A)  Final      Susceptibility   Escherichia coli - MIC*    AMPICILLIN >=32 RESISTANT Resistant     CEFAZOLIN >=64 RESISTANT Resistant     CEFEPIME <=0.12 SENSITIVE Sensitive     CEFTRIAXONE 32 RESISTANT Resistant     CIPROFLOXACIN <=0.25 SENSITIVE Sensitive     GENTAMICIN <=1 SENSITIVE Sensitive     IMIPENEM <=0.25  SENSITIVE Sensitive     NITROFURANTOIN 32 SENSITIVE Sensitive     TRIMETH/SULFA <=20 SENSITIVE Sensitive     AMPICILLIN/SULBACTAM >=32 RESISTANT Resistant     * >=100,000 COLONIES/mL ESCHERICHIA COLI     Studies: No results found.  Scheduled Meds: . amantadine  100 mg Per Tube Daily  . aspirin  81 mg Per Tube Daily  . atorvastatin  80 mg Per Tube Daily  . bethanechol  10 mg Per Tube TID  . carvedilol  25 mg Per Tube BID WC  . chlorhexidine gluconate (MEDLINE KIT)  15 mL Mouth Rinse BID  . feeding supplement (PROSource TF)  45 mL Per Tube BID  . free water  200 mL Per Tube Q2H  . heparin  5,000 Units Subcutaneous Q8H  . isosorbide-hydrALAZINE  1 tablet Per Tube TID  . mouth rinse  15 mL Mouth Rinse BID  . polyvinyl alcohol  1 drop Both Eyes QID  . QUEtiapine  50 mg Per Tube QHS  . sodium chloride flush  3 mL Intravenous Q12H  . sulfamethoxazole-trimethoprim  20 mL Per Tube Q12H  . ticagrelor  90 mg Per Tube BID   Continuous Infusions: . sodium chloride 250 mL (08/17/20 0812)  . feeding supplement (JEVITY 1.5 CAL/FIBER) Stopped (09/15/20 0505)    Active Problems:   Acute ST elevation myocardial infarction (STEMI) (HCC)   Cardiac arrest (HCC)   Elevated blood pressure reading with diagnosis of hypertension   Shock circulatory (HCC)   Encephalopathy   History of ETT   Cardiomyopathy, ischemic   Anoxic brain damage [facility])   Dysphagia   DNR (do not resuscitate) discussion   Gait instability   Consultants:  Cardiology  Interventional radiology  PCCM  Neurology  Procedures:  Cardiac catheterization 12/4  Continuous EEG 12/4 and overnight EEG with video 12/5  Echocardiogram 12/5  EEG 12/9  Percutaneous PEG tube placement 12/30  Antibiotics: Anti-infectives (From admission, onward)   Start     Dose/Rate Route Frequency Ordered Stop   09/13/20 1000  sulfamethoxazole-trimethoprim (BACTRIM) 200-40 MG/5ML suspension 20 mL        20 mL Per Tube Every 12 hours  09/13/20 0734 09/20/20 0959   09/13/20 0745  cefTRIAXone (ROCEPHIN) 1 g in sodium chloride 0.9 % 100 mL IVPB  Status:  Discontinued        1 g 200 mL/hr over 30 Minutes Intravenous Every 24 hours 09/13/20 0658 09/13/20 0734   08/17/20 0000  ceFAZolin (ANCEF) IVPB 2g/100 mL premix        2 g 200 mL/hr over 30 Minutes Intravenous To Radiology 08/15/20 1346 08/17/20 1111   07/22/20 0830  cefTRIAXone (ROCEPHIN) 2 g in sodium chloride 0.9 % 100 mL IVPB        2 g 200 mL/hr over 30 Minutes Intravenous Every 24 hours 07/22/20 0720 07/26/20 0858       Time spent: 20 minutes    Erin Hearing ANP  [facility] 7 am - 330 pm/M-F for direct patient care and secure chat Please refer to Amion for contact information 55 days

## 2020-09-15 NOTE — Progress Notes (Signed)
Occupational Therapy Treatment Patient Details Name: Antonio Navarro MRN: [id number] DOB: Feb 07, 1961 Today's Date: 09/15/2020    History of present illness [name] with history of tobacco abuse, obesity, HTN who was admitted 07/22/20 post cardiac arrest with inferolateral STEMI. He was resuscitated in the field by EMS after being found to be in V fib. Approximately 30 minutes of CPR. He was intubated on arrival.  Pt with anoxic brain injury. PEG placed on 08/17/20   OT comments  Patient goals adjusted this date.  Patient seen for self feeding.  New goal placed.  Patient able to sit edge of bed, stand and side step to get higher in the bed.  SO in the room for treatment.  Continue OT in the acute setting.  SNF continues to be recommended.    Follow Up Recommendations  SNF;Supervision/Assistance - 24 hour    Equipment Recommendations  Wheelchair (measurements OT);Wheelchair cushion (measurements OT);Hospital bed;3 in 1 bedside commode    Recommendations for Other Services      Precautions / Restrictions Precautions Precautions: Fall;Other (comment) Precaution Comments: mitts, gtube       Mobility Bed Mobility   Bed Mobility: Supine to Sit;Sit to Supine     Supine to sit: Min assist Sit to supine: Min guard      Transfers Overall transfer level: Needs assistance Equipment used: 1 person hand held assist Transfers: Sit to/from Stand Sit to Stand: Min guard Stand pivot transfers: Min assist       General transfer comment: Min A to stand and step closer to [facility].    Balance   Sitting-balance support: No upper extremity supported;Feet unsupported Sitting balance-Leahy Scale: Fair   Postural control: Posterior lean Standing balance support: Bilateral upper extremity supported Standing balance-Leahy Scale: Poor                             ADL either performed or assessed with clinical judgement   ADL   Eating/Feeding: Moderate  assistance;Sitting;Maximal assistance Eating/Feeding Details (indicate cue type and reason): able to bring cup and drinks to mouth.  Difficulty gripping utensils. Grooming: Wash/dry face;Sitting Grooming Details (indicate cue type and reason): tactile hand over hand for throughness                             Functional mobility during ADLs: Minimal assistance;Moderate assistance General ADL Comments: HHA                                                                                         Pertinent Vitals/ Pain       Pain Assessment: No/denies pain                                                          Frequency  Min 2X/week        Progress Toward Goals  OT Goals(current  goals can now be found in the care plan section)  Progress towards OT goals: Progressing toward goals  Acute Rehab OT Goals Patient Stated Goal: be able to eat better per SO OT Goal Formulation: With family Time For Goal Achievement: 09/27/20 Potential to Achieve Goals: Fair ADL Goals Pt Will Perform Eating: with min assist;sitting Pt Will Perform Grooming: with supervision;sitting;standing Pt Will Perform Upper Body Bathing: with supervision;sitting Pt Will Perform Upper Body Dressing: with supervision;sitting  Plan Discharge plan remains appropriate;Frequency remains appropriate    Co-evaluation                 AM-PAC OT "6 Clicks" Daily Activity     Outcome Measure   Help from another person eating meals?: A Lot Help from another person taking care of personal grooming?: A Little Help from another person toileting, which includes using toliet, bedpan, or urinal?: A Lot Help from another person bathing (including washing, rinsing, drying)?: A Lot Help from another person to put on and taking off regular upper body clothing?: A Lot Help from another person to put on and taking off regular lower body clothing?: A  Lot 6 Click Score: 13    End of Session    OT Visit Diagnosis: Muscle weakness (generalized) (M62.81);Pain;Other abnormalities of gait and mobility (R26.89);Cognitive communication deficit (R41.841);Low vision, both eyes (H54.2)   Activity Tolerance Patient tolerated treatment well   Patient Left in bed;with call bell/phone within reach;with restraints reapplied;with family/visitor present   Nurse Communication          Time: 5176-1607 OT Time Calculation (min): 15 min  Charges: OT Treatments $Self Care/Home Management : 8-22 mins  09/15/2020  Rich, OTR/L  Acute Rehabilitation Services  Office:  [phone]    Metta Clines 09/15/2020, 12:55 PM

## 2020-09-15 NOTE — Progress Notes (Signed)
Abdomen pain noted by nurse and PT.  Free water per tube stopped at this time. Pt did not verbalized pain. But placed hand on peg tube abd area when asked to show me where his pain was.  [place] MD made aware by RN. DG Abd orders in place.  Will continue to monitor pt.

## 2020-09-15 NOTE — Progress Notes (Signed)
Physical Therapy Treatment Patient Details Name: FREDRICO BEEDLE MRN: [id number] DOB: [birth year] Today's Date: 09/15/2020    History of Present Illness [name] with history of tobacco abuse, obesity, HTN who was admitted 07/22/20 post cardiac arrest with inferolateral STEMI. He was resuscitated in the field by EMS after being found to be in V fib. Approximately 30 minutes of CPR. He was intubated on arrival.  Pt with anoxic brain injury. PEG placed on 08/17/20    PT Comments    Pt was seen for mobility on RW, with 2 min or mod assist to stand.  Has very limited standing endurance, but according to his significant other is fighting off a UTI and has been lethargic two days.  Follow acutely for the progression of  Standing and gait to restore his independence.  Pt is demonstrating awareness of his limits but is also becoming a bit self limiting.  Follow Up Recommendations  SNF     Equipment Recommendations  None recommended by PT    Recommendations for Other Services       Precautions / Restrictions Precautions Precautions: Fall;Other (comment) Precaution Comments: mitts, gtube Restrictions Weight Bearing Restrictions: No    Mobility  Bed Mobility Overal bed mobility: Needs Assistance Bed Mobility: Supine to Sit;Sit to Supine     Supine to sit: Min assist Sit to supine: Min guard   General bed mobility comments: pt eager to go bacck to bed, talked with him about being OOB but declines to be up longer  Transfers Overall transfer level: Needs assistance Equipment used: 1 person hand held assist Transfers: Sit to/from Stand Sit to Stand: Mod assist         General transfer comment: mod assist with increase over last couple days, not feeling well today  Ambulation/Gait Ambulation/Gait assistance: Min assist;+2 physical assistance;+2 safety/equipment Gait Distance (Feet): 10 Feet (on side of bed) Assistive device: Rolling walker (2 wheeled);2 person hand held  assist Gait Pattern/deviations: Step-to pattern;Narrow base of support;Decreased stride length Gait velocity: decreased   General Gait Details: AP instability, but more so posterior, sits with no warning, low tolerance for standing   Stairs             Wheelchair Mobility    Modified Rankin (Stroke Patients Only)       Balance Overall balance assessment: Needs assistance Sitting-balance support: Feet supported Sitting balance-Leahy Scale: Fair Sitting balance - Comments: inattentive at time, requires supervision for safety Postural control: Posterior lean Standing balance support: Bilateral upper extremity supported Standing balance-Leahy Scale: Poor Standing balance comment: requires assist to control walker and shift with help to sidestep                            Cognition Arousal/Alertness: Awake/alert Behavior During Therapy: Flat affect Overall Cognitive Status: Impaired/Different from baseline Area of Impairment: Problem solving;Awareness;Safety/judgement;Following commands;Memory;Attention;Orientation               Rancho Levels of Cognitive Functioning Rancho [place] Amigos Scales of Cognitive Functioning: Confused/inappropriate/non-agitated Orientation Level: Situation Current Attention Level: Selective Memory: Decreased short-term memory;Decreased recall of precautions Following Commands: Follows one step commands inconsistently;Follows one step commands with increased time Safety/Judgement: Decreased awareness of deficits;Decreased awareness of safety Awareness: Intellectual Problem Solving: Slow processing;Difficulty sequencing;Decreased initiation;Requires verbal cues;Requires tactile cues        Exercises General Exercises - Lower Extremity Ankle Circles/Pumps: AAROM;5 reps Quad Sets: AAROM;10 reps Heel Slides: AAROM;10 reps Hip ABduction/ADduction:  AAROM;10 reps    General Comments General comments (skin integrity, edema, etc.):  pt is up to walk with assist, help to shift wgt and sidestep      Pertinent Vitals/Pain Pain Assessment: Faces Faces Pain Scale: Hurts little more Pain Location: hand over the G-tube insertion when asked where he hurts Pain Descriptors / Indicators: Guarding;Grimacing Pain Intervention(s): Limited activity within patient's tolerance;Monitored during session;Repositioned;Other (comment) (talked with nursing about pain and she came in to discuss wiht pt)    Home Living                      Prior Function            PT Goals (current goals can now be found in the care plan section) Acute Rehab PT Goals Patient Stated Goal: none stated Progress towards PT goals: Progressing toward goals    Frequency    Min 3X/week      PT Plan Current plan remains appropriate    Co-evaluation              AM-PAC PT "6 Clicks" Mobility   Outcome Measure  Help needed turning from your back to your side while in a flat bed without using bedrails?: A Little Help needed moving from lying on your back to sitting on the side of a flat bed without using bedrails?: A Little Help needed moving to and from a bed to a chair (including a wheelchair)?: A Little Help needed standing up from a chair using your arms (e.g., wheelchair or bedside chair)?: A Lot Help needed to walk in hospital room?: A Lot Help needed climbing 3-5 steps with a railing? : A Lot 6 Click Score: 15    End of Session Equipment Utilized During Treatment: Gait belt Activity Tolerance: Patient tolerated treatment well Patient left: in bed;with call bell/phone within reach;with bed alarm set Nurse Communication: Mobility status PT Visit Diagnosis: Other abnormalities of gait and mobility (R26.89)     Time: 1435-1501 PT Time Calculation (min) (ACUTE ONLY): 26 min  Charges:  $Gait Training: 8-22 mins $Therapeutic Exercise: 8-22 mins                    Ramond Dial 09/15/2020, 7:29 PM  Mee Hives, PT MS Acute  Rehab Dept. Number: [place]

## 2020-09-15 NOTE — Plan of Care (Signed)
  Problem: Clinical Measurements: Goal: Ability to maintain clinical measurements within normal limits will improve Outcome: Adequate for Discharge   Problem: Clinical Measurements: Goal: Respiratory complications will improve Outcome: Adequate for Discharge   Problem: Clinical Measurements: Goal: Diagnostic test results will improve Outcome: Adequate for Discharge   Problem: Activity: Goal: Risk for activity intolerance will decrease Outcome: Adequate for Discharge   Problem: Nutrition: Goal: Adequate nutrition will be maintained Outcome: Adequate for Discharge

## 2020-09-15 NOTE — Plan of Care (Signed)
  Problem: Health Behavior/Discharge Planning: Goal: Ability to manage health-related needs will improve Outcome: Progressing   Problem: Clinical Measurements: Goal: Ability to maintain clinical measurements within normal limits will improve Outcome: Progressing   Problem: Activity: Goal: Risk for activity intolerance will decrease Outcome: Progressing   Problem: Elimination: Goal: Will not experience complications related to bowel motility Outcome: Progressing   Problem: Elimination: Goal: Will not experience complications related to urinary retention Outcome: Progressing   Problem: Skin Integrity: Goal: Risk for impaired skin integrity will decrease Outcome: Progressing   Problem: Nutrition: Goal: Adequate nutrition will be maintained Outcome: Progressing

## 2020-09-16 LAB — GLUCOSE, CAPILLARY: Glucose-Capillary: 175 mg/dL — ABNORMAL HIGH (ref 70–99)

## 2020-09-16 NOTE — Plan of Care (Signed)
  Problem: Activity: Goal: Risk for activity intolerance will decrease Outcome: Adequate for Discharge   

## 2020-09-16 NOTE — Progress Notes (Signed)
[facility]  PROGRESS NOTE  Antonio Navarro GUR:[id number] DOB: [birth year] DOA: 07/22/2020 PCP: Patient, No Pcp Per   Brief HPI:   60 year old male with history of hypertension, chronic back pain, prior renal colic who experienced out of hospital cardiac arrest with downtime of 9 minutes.  Initial rhythm was ventricular fibrillation.  He was defibrillated, received 6 doses of epi before ROSC.  Subsequent work-up revealed STEMI.  He underwent cardiac catheterization with a DES placed to circumflex.  Cardiology following and has placed patient on Brilinta and aspirin and also being treated with beta-blockers and statins.  Neurological standpoint he has anoxic brain injury.  Patient was unable to swallow, underwent percutaneous PEG placement on 08/17/2021.  He is awake, moving all extremities but does not follow commands.  Current plan to transition to skilled nursing facility once bed becomes available.    Subjective   Patient seen and examined, pleasantly confused.  Denies pain.  Mittens in place.   Assessment/Plan:     1. Out of hospital cardiac arrest secondary to STEMI-underwent defibrillation, ROSC with subsequent catheterization revealed disease in circumflex requiring DES.  Continue beta-blocker, Brilinta, aspirin.   2. Ischemic cardiomyopathy-patient underwent cardiac catheterization with DES to circumflex as above.  Continue beta-blocker, Brilinta, aspirin.  Continue hydralazine and Isordil. 3. UTI-patient has history of E. coli UTI which was diagnosed on 09/06/2020.  E. coli resistant to ampicillin, Unasyn, cefazolin, ceftriaxone.  Patient is currently on Bactrim. 4. Significant anoxic brain injury-secondary to cardiac arrest, patient has severe cognitive limitations.  Consistently work with PT/OT.  No behavior disturbance.  Continue Seroquel 25 mg nightly for insomnia.  Continue daytime Symmetrel. 5. Hypertension-continue Coreg.  Blood pressures well  controlled. 6. Hyponatremia-resolved.  Sodium is 134. 7. Acute kidney injury-resolved, creatinine is down to 1.27. 8. Nonsustained V. tach-continue amiodarone, Coreg. 9. Dysphagia-patient is on tube feed, prostat  at bedtime.       COVID-19 Labs  No results for input(s): DDIMER, FERRITIN, LDH, CRP in the last 72 hours.  Lab Results  Component Value Date   [place] NEGATIVE 07/22/2020     Scheduled medications:   . amantadine  100 mg Per Tube Daily  . aspirin  81 mg Per Tube Daily  . atorvastatin  80 mg Per Tube Daily  . bethanechol  10 mg Per Tube TID  . carvedilol  25 mg Per Tube BID WC  . chlorhexidine gluconate (MEDLINE KIT)  15 mL Mouth Rinse BID  . feeding supplement (PROSource TF)  45 mL Per Tube BID  . free water  200 mL Per Tube Q2H  . heparin  5,000 Units Subcutaneous Q8H  . isosorbide-hydrALAZINE  1 tablet Per Tube TID  . mouth rinse  15 mL Mouth Rinse BID  . polyvinyl alcohol  1 drop Both Eyes QID  . QUEtiapine  50 mg Per Tube QHS  . sodium chloride flush  3 mL Intravenous Q12H  . sulfamethoxazole-trimethoprim  20 mL Per Tube Q12H  . ticagrelor  90 mg Per Tube BID         CBG: Recent Labs  Lab 09/14/20 2149 09/15/20 0502 09/15/20 2217 09/16/20 0611  GLUCAP 152* 178* 198* 175*    SpO2: 100 % O2 Flow Rate (L/min): 4 L/min FiO2 (%): 36 %      Antibiotics: Anti-infectives (From admission, onward)   Start     Dose/Rate Route Frequency Ordered Stop   09/13/20 1000  sulfamethoxazole-trimethoprim (BACTRIM) 200-40 MG/5ML suspension 20 mL  20 mL Per Tube Every 12 hours 09/13/20 0734 09/20/20 0959   09/13/20 0745  cefTRIAXone (ROCEPHIN) 1 g in sodium chloride 0.9 % 100 mL IVPB  Status:  Discontinued        1 g 200 mL/hr over 30 Minutes Intravenous Every 24 hours 09/13/20 0658 09/13/20 0734   08/17/20 0000  ceFAZolin (ANCEF) IVPB 2g/100 mL premix        2 g 200 mL/hr over 30 Minutes Intravenous To Radiology 08/15/20 1346 08/17/20 1111    07/22/20 0830  cefTRIAXone (ROCEPHIN) 2 g in sodium chloride 0.9 % 100 mL IVPB        2 g 200 mL/hr over 30 Minutes Intravenous Every 24 hours 07/22/20 0720 07/26/20 0858       DVT prophylaxis: Heparin  Code Status: Full code  Family Communication: No family at bedside   Consultants:  Cardiology  Intervention radiology  PCCM  Neurology  Procedures:  Cardiac catheterization 07/22/2020  Echocardiogram  EEG  PEG tube placement  Continuous EEG on 07/22/2020 and overnight EEG with video 07/23/2020    Objective   Vitals:   09/16/20 0230 09/16/20 0416 09/16/20 0843 09/16/20 1149  BP:  115/89 131/78 99/61  Pulse:  80 78 82  Resp:  18  20  Temp:  98.7 F (37.1 C)    TempSrc:  Oral    SpO2:  100%    Weight: 87.3 kg     Height:        Intake/Output Summary (Last 24 hours) at 09/16/2020 1327 Last data filed at 09/16/2020 4327 Gross per 24 hour  Intake 0 ml  Output 2875 ml  Net -2875 ml    01/27 1901 - 01/29 0700 In: 200 [P.O.:200] Out: 6147 [WLKHV:7473]  Filed Weights   09/15/20 0050 09/15/20 0500 09/16/20 0230  Weight: 88.2 kg 88.2 kg 87.3 kg    Physical Examination:    General-appears in no acute distress  Heart-S1-S2, regular, no murmur auscultated  Lungs-clear to auscultation bilaterally, no wheezing or crackles auscultated  Abdomen-soft, nontender, no organomegaly  Extremities-no edema in the lower extremities  Neuro-alert, confused.   Status is: Inpatient  Dispo: The patient is from: Home              Anticipated d/c is to: Skilled nursing facility              Anticipated d/c date is: 2 4//2022              Patient currently medically stable for discharge  Barrier to discharge-awaiting bed at skilled nursing facility      [place] If 7PM-7AM, please contact night-coverage at [email or web address], Office  [phone]   09/16/2020, 1:27 PM  LOS: 56 days

## 2020-09-17 LAB — GLUCOSE, CAPILLARY: Glucose-Capillary: 191 mg/dL — ABNORMAL HIGH (ref 70–99)

## 2020-09-17 NOTE — Progress Notes (Signed)
Agree with student nurses supervised documentation with nursing instructor.

## 2020-09-17 NOTE — Progress Notes (Signed)
[facility]  PROGRESS NOTE  Antonio Navarro QVZ:[id number] DOB: 1961/06/07 DOA: 07/22/2020 PCP: Patient, No Pcp Per   Brief HPI:   60 year old male with history of hypertension, chronic back pain, prior renal colic who experienced out of hospital cardiac arrest with downtime of 9 minutes.  Initial rhythm was ventricular fibrillation.  He was defibrillated, received 6 doses of epi before ROSC.  Subsequent work-up revealed STEMI.  He underwent cardiac catheterization with a DES placed to circumflex.  Cardiology following and has placed patient on Brilinta and aspirin and also being treated with beta-blockers and statins.  Neurological standpoint he has anoxic brain injury.  Patient was unable to swallow, underwent percutaneous PEG placement on 08/17/2021.  He is awake, moving all extremities but does not follow commands.  Current plan to transition to skilled nursing facility once bed becomes available.    Subjective   Patient seen and examined, appears comfortable.   Assessment/Plan:     1. Out of hospital cardiac arrest secondary to STEMI-underwent defibrillation, ROSC with subsequent catheterization revealed disease in circumflex requiring DES.  Continue beta-blocker, Brilinta, aspirin.   2. Ischemic cardiomyopathy-patient underwent cardiac catheterization with DES to circumflex as above.  Continue beta-blocker, Brilinta, aspirin.  Continue hydralazine and Isordil. 3. UTI-patient has history of E. coli UTI which was diagnosed on 09/06/2020.  E. coli resistant to ampicillin, Unasyn, cefazolin, ceftriaxone.  Patient is currently on Bactrim. 4. Significant anoxic brain injury-secondary to cardiac arrest, patient has severe cognitive limitations.  Consistently work with PT/OT.  No behavior disturbance.  Continue Seroquel 25 mg nightly for insomnia.  Continue daytime Symmetrel. 5. Hypertension-continue Coreg.  Blood pressures well controlled. 6. Hyponatremia-resolved.  Sodium is 134. 7. Acute  kidney injury-resolved, creatinine is down to 1.27. 8. Nonsustained V. tach-continue amiodarone, Coreg. 9. Dysphagia-patient is on tube feed, prostat  at bedtime.       COVID-19 Labs  No results for input(s): DDIMER, FERRITIN, LDH, CRP in the last 72 hours.  Lab Results  Component Value Date   [place] NEGATIVE 07/22/2020     Scheduled medications:   . amantadine  100 mg Per Tube Daily  . aspirin  81 mg Per Tube Daily  . atorvastatin  80 mg Per Tube Daily  . bethanechol  10 mg Per Tube TID  . carvedilol  25 mg Per Tube BID WC  . chlorhexidine gluconate (MEDLINE KIT)  15 mL Mouth Rinse BID  . feeding supplement (PROSource TF)  45 mL Per Tube BID  . free water  200 mL Per Tube Q2H  . heparin  5,000 Units Subcutaneous Q8H  . isosorbide-hydrALAZINE  1 tablet Per Tube TID  . mouth rinse  15 mL Mouth Rinse BID  . polyvinyl alcohol  1 drop Both Eyes QID  . QUEtiapine  50 mg Per Tube QHS  . sodium chloride flush  3 mL Intravenous Q12H  . sulfamethoxazole-trimethoprim  20 mL Per Tube Q12H  . ticagrelor  90 mg Per Tube BID         CBG: Recent Labs  Lab 09/14/20 2149 09/15/20 0502 09/15/20 2217 09/16/20 0611 09/17/20 0512  GLUCAP 152* 178* 198* 175* 191*    SpO2: 98 % O2 Flow Rate (L/min): 4 L/min FiO2 (%): 36 %      Antibiotics: Anti-infectives (From admission, onward)   Start     Dose/Rate Route Frequency Ordered Stop   09/13/20 1000  sulfamethoxazole-trimethoprim (BACTRIM) 200-40 MG/5ML suspension 20 mL        20 mL  Per Tube Every 12 hours 09/13/20 0734 09/20/20 0959   09/13/20 0745  cefTRIAXone (ROCEPHIN) 1 g in sodium chloride 0.9 % 100 mL IVPB  Status:  Discontinued        1 g 200 mL/hr over 30 Minutes Intravenous Every 24 hours 09/13/20 0658 09/13/20 0734   08/17/20 0000  ceFAZolin (ANCEF) IVPB 2g/100 mL premix        2 g 200 mL/hr over 30 Minutes Intravenous To Radiology 08/15/20 1346 08/17/20 1111   07/22/20 0830  cefTRIAXone (ROCEPHIN) 2 g in  sodium chloride 0.9 % 100 mL IVPB        2 g 200 mL/hr over 30 Minutes Intravenous Every 24 hours 07/22/20 0720 07/26/20 0858       DVT prophylaxis: Heparin  Code Status: Full code  Family Communication: No family at bedside   Consultants:  Cardiology  Intervention radiology  PCCM  Neurology  Procedures:  Cardiac catheterization 07/22/2020  Echocardiogram  EEG  PEG tube placement  Continuous EEG on 07/22/2020 and overnight EEG with video 07/23/2020    Objective   Vitals:   09/17/20 0519 09/17/20 0908 09/17/20 0937 09/17/20 1152  BP: 115/75 (!) 144/95 (!) 136/93 137/87  Pulse: 84 85 84 73  Resp: '20  20 20  ' Temp: 98.3 F (36.8 C)   (!) 97.5 F (36.4 C)  TempSrc: Oral   Oral  SpO2: 95%  96% 98%  Weight:      Height:        Intake/Output Summary (Last 24 hours) at 09/17/2020 1335 Last data filed at 09/17/2020 0349 Gross per 24 hour  Intake 1470 ml  Output 2325 ml  Net -855 ml    01/28 1901 - 01/30 0700 In: 1791 [P.O.:120] Out: 4150 [Urine:4150]  Filed Weights   09/15/20 0500 09/16/20 0230 09/17/20 0134  Weight: 88.2 kg 87.3 kg 86.5 kg    Physical Examination:    General-appears in no acute distress  Heart-S1-S2, regular, no murmur auscultated  Lungs-clear to auscultation bilaterally, no wheezing or crackles auscultated  Abdomen-soft, nontender, no organomegaly  Extremities-no edema in the lower extremities  Neuro-alert, confused   Status is: Inpatient  Dispo: The patient is from: Home              Anticipated d/c is to: Skilled nursing facility              Anticipated d/c date is: 2 4//2022              Patient currently medically stable for discharge  Barrier to discharge-awaiting bed at skilled nursing facility      [place] If 7PM-7AM, please contact night-coverage at [email or web address], Office  [phone]   09/17/2020, 1:35 PM  LOS: 57 days

## 2020-09-18 DIAGNOSIS — N390 Urinary tract infection, site not specified: Secondary | ICD-10-CM

## 2020-09-18 DIAGNOSIS — B9620 Unspecified Escherichia coli [E. coli] as the cause of diseases classified elsewhere: Secondary | ICD-10-CM

## 2020-09-18 LAB — CBC WITH DIFFERENTIAL/PLATELET
Abs Immature Granulocytes: 0.05 10*3/uL (ref 0.00–0.07)
Basophils Absolute: 0.1 10*3/uL (ref 0.0–0.1)
Basophils Relative: 2 %
Eosinophils Absolute: 0.3 10*3/uL (ref 0.0–0.5)
Eosinophils Relative: 4 %
HCT: 35.3 % — ABNORMAL LOW (ref 39.0–52.0)
Hemoglobin: 11.9 g/dL — ABNORMAL LOW (ref 13.0–17.0)
Immature Granulocytes: 1 %
Lymphocytes Relative: 26 %
Lymphs Abs: 2.1 10*3/uL (ref 0.7–4.0)
MCH: 33.1 pg (ref 26.0–34.0)
MCHC: 33.7 g/dL (ref 30.0–36.0)
MCV: 98.3 fL (ref 80.0–100.0)
Monocytes Absolute: 0.9 10*3/uL (ref 0.1–1.0)
Monocytes Relative: 11 %
Neutro Abs: 4.7 10*3/uL (ref 1.7–7.7)
Neutrophils Relative %: 56 %
Platelets: 259 10*3/uL (ref 150–400)
RBC: 3.59 MIL/uL — ABNORMAL LOW (ref 4.22–5.81)
RDW: 13.2 % (ref 11.5–15.5)
WBC: 8 10*3/uL (ref 4.0–10.5)
nRBC: 0 % (ref 0.0–0.2)

## 2020-09-18 LAB — COMPREHENSIVE METABOLIC PANEL
ALT: 63 U/L — ABNORMAL HIGH (ref 0–44)
AST: 27 U/L (ref 15–41)
Albumin: 2.9 g/dL — ABNORMAL LOW (ref 3.5–5.0)
Alkaline Phosphatase: 77 U/L (ref 38–126)
Anion gap: 9 (ref 5–15)
BUN: 19 mg/dL (ref 6–20)
CO2: 26 mmol/L (ref 22–32)
Calcium: 9.5 mg/dL (ref 8.9–10.3)
Chloride: 101 mmol/L (ref 98–111)
Creatinine, Ser: 1.48 mg/dL — ABNORMAL HIGH (ref 0.61–1.24)
GFR, Estimated: 55 mL/min — ABNORMAL LOW (ref >60)
Glucose, Bld: 196 mg/dL — ABNORMAL HIGH (ref 70–99)
Potassium: 4.6 mmol/L (ref 3.5–5.1)
Sodium: 136 mmol/L (ref 135–145)
Total Bilirubin: 0.7 mg/dL (ref 0.3–1.2)
Total Protein: 7.4 g/dL (ref 6.5–8.1)

## 2020-09-18 LAB — GLUCOSE, CAPILLARY
Glucose-Capillary: 133 mg/dL — ABNORMAL HIGH (ref 70–99)
Glucose-Capillary: 201 mg/dL — ABNORMAL HIGH (ref 70–99)

## 2020-09-18 MED ORDER — INSULIN ASPART 100 UNIT/ML ~~LOC~~ SOLN
0.0000 [IU] | Freq: Four times a day (QID) | SUBCUTANEOUS | Status: DC
  Administered 2020-09-19: 2 [IU] via SUBCUTANEOUS
  Administered 2020-09-19 (×2): 3 [IU] via SUBCUTANEOUS
  Administered 2020-09-20: 5 [IU] via SUBCUTANEOUS
  Administered 2020-09-20: 3 [IU] via SUBCUTANEOUS
  Administered 2020-09-20: 2 [IU] via SUBCUTANEOUS
  Administered 2020-09-21: 3 [IU] via SUBCUTANEOUS
  Administered 2020-09-21 (×2): 2 [IU] via SUBCUTANEOUS
  Administered 2020-09-21: 3 [IU] via SUBCUTANEOUS
  Administered 2020-09-22: 5 [IU] via SUBCUTANEOUS
  Administered 2020-09-22: 2 [IU] via SUBCUTANEOUS
  Administered 2020-09-22: 3 [IU] via SUBCUTANEOUS
  Administered 2020-09-22: 2 [IU] via SUBCUTANEOUS
  Administered 2020-09-23 (×4): 3 [IU] via SUBCUTANEOUS
  Administered 2020-09-24 (×2): 2 [IU] via SUBCUTANEOUS
  Administered 2020-09-24: 3 [IU] via SUBCUTANEOUS
  Administered 2020-09-25 (×2): 2 [IU] via SUBCUTANEOUS
  Administered 2020-09-25 – 2020-09-26 (×3): 3 [IU] via SUBCUTANEOUS
  Administered 2020-09-26: 2 [IU] via SUBCUTANEOUS
  Administered 2020-09-27 – 2020-09-28 (×4): 3 [IU] via SUBCUTANEOUS
  Administered 2020-09-28 – 2020-09-29 (×2): 2 [IU] via SUBCUTANEOUS
  Administered 2020-09-29 – 2020-09-30 (×2): 3 [IU] via SUBCUTANEOUS
  Administered 2020-09-30: 2 [IU] via SUBCUTANEOUS
  Administered 2020-09-30 – 2020-10-01 (×4): 3 [IU] via SUBCUTANEOUS
  Administered 2020-10-01: 2 [IU] via SUBCUTANEOUS
  Administered 2020-10-01 – 2020-10-03 (×3): 3 [IU] via SUBCUTANEOUS
  Administered 2020-10-03: 2 [IU] via SUBCUTANEOUS
  Administered 2020-10-03 – 2020-10-04 (×3): 3 [IU] via SUBCUTANEOUS
  Administered 2020-10-04: 2 [IU] via SUBCUTANEOUS
  Administered 2020-10-04: 3 [IU] via SUBCUTANEOUS
  Administered 2020-10-05: 2 [IU] via SUBCUTANEOUS
  Administered 2020-10-05: 3 [IU] via SUBCUTANEOUS
  Administered 2020-10-06 (×3): 2 [IU] via SUBCUTANEOUS
  Administered 2020-10-07: 3 [IU] via SUBCUTANEOUS
  Administered 2020-10-07 (×2): 2 [IU] via SUBCUTANEOUS
  Administered 2020-10-08: 3 [IU] via SUBCUTANEOUS
  Administered 2020-10-09 – 2020-10-12 (×8): 2 [IU] via SUBCUTANEOUS
  Administered 2020-10-12: 3 [IU] via SUBCUTANEOUS
  Administered 2020-10-12 – 2020-10-13 (×4): 2 [IU] via SUBCUTANEOUS
  Administered 2020-10-13: 3 [IU] via SUBCUTANEOUS
  Administered 2020-10-13 – 2020-10-14 (×2): 2 [IU] via SUBCUTANEOUS
  Administered 2020-10-14 – 2020-10-15 (×2): 3 [IU] via SUBCUTANEOUS
  Administered 2020-10-15: 2 [IU] via SUBCUTANEOUS
  Administered 2020-10-15: 3 [IU] via SUBCUTANEOUS
  Administered 2020-10-15: 2 [IU] via SUBCUTANEOUS
  Administered 2020-10-16 (×2): 3 [IU] via SUBCUTANEOUS
  Administered 2020-10-19: 2 [IU] via SUBCUTANEOUS
  Administered 2020-10-19: 5 [IU] via SUBCUTANEOUS
  Administered 2020-10-20 – 2020-10-21 (×3): 3 [IU] via SUBCUTANEOUS
  Administered 2020-10-21: 2 [IU] via SUBCUTANEOUS
  Administered 2020-10-21: 3 [IU] via SUBCUTANEOUS
  Administered 2020-10-22: 2 [IU] via SUBCUTANEOUS
  Administered 2020-10-23: 5 [IU] via SUBCUTANEOUS
  Administered 2020-10-23: 2 [IU] via SUBCUTANEOUS
  Administered 2020-10-24: 1 [IU] via SUBCUTANEOUS
  Administered 2020-10-24 – 2020-10-25 (×2): 3 [IU] via SUBCUTANEOUS
  Administered 2020-10-25: 2 [IU] via SUBCUTANEOUS
  Administered 2020-10-25: 3 [IU] via SUBCUTANEOUS
  Administered 2020-10-26: 2 [IU] via SUBCUTANEOUS
  Administered 2020-10-27: 3 [IU] via SUBCUTANEOUS
  Administered 2020-10-27 – 2020-10-28 (×3): 2 [IU] via SUBCUTANEOUS
  Administered 2020-10-28 – 2020-10-29 (×3): 3 [IU] via SUBCUTANEOUS
  Administered 2020-10-30: 2 [IU] via SUBCUTANEOUS

## 2020-09-18 NOTE — Progress Notes (Signed)
Pt's tube feeding should run nocturnal only. Clarify with Dr. Sloan Leiter, [facility].

## 2020-09-18 NOTE — Progress Notes (Addendum)
[facility] PROGRESS NOTE  CORVIN SORBO PTW:[id number] DOB: 08-31-1960 DOA: 07/22/2020 PCP: Patient, No Pcp Per       Status: Remains inpatient appropriate because:Altered mental status and Unsafe d/c plan   Dispo: The patient is from: Home              Anticipated d/c is to: SNF              Anticipated d/c date is: > 3 days              Patient currently is medically stable to d/c.  Barriers to discharge: Medicaid and disability pending. No SNF bed offers   Code Status: Full Family Communication: 1/06 spoke with patient's sister [name] -1/24 DVT prophylaxis: sq heparin Vaccination status: Completed both doses of Pfizer Covid vaccination on 12/14/2019  Foley catheter: No  HPI: 60 year old male history of hypertension prescribed Norvasc prior to admission, chronic back pain, prior renal calculi who experienced an out of hospital cardiac arrest with downtime of 9 minutes.  Initial rhythm was ventricular fibrillation.  He was defibrillated deceived 6 doses of epi before ROSC.  Subsequent work-up revealed STEMI.  He underwent cardiac catheterization with a DES placed to the circumflex.  Cardiology following and has placed patient on Brilinta and aspirin and is also being treated with beta-blocker and statins.  From a neurological standpoint he clinically has anoxic brain injury.  During this hospitalization he experienced AKI and was treated with fluids and correction of hypoperfusion from cardiac arrest.  Echo performed this admission revealed ischemic cardiomyopathy with an EF of 40%  In addition to above patient has been unable to pass swallowing evaluations and subsequently has undergone percutaneous PEG tube placement on 12/30.  Patient is awake and moving all extremities spontaneously but does not follow commands and does not verbalize palliative care has met with patient and family and has emphasized poor prognosis and will continue to meet with the family during the  hospitalization.  Current plan is to transition to SNF bed once available.  Expect need for lifelong 24/7 care for all ADLs.  Subjective: Very sleepy today.  Briefly awakened but did not stay awake.  Staff states patient had slept through the night  Objective: Vitals:   09/17/20 2101 09/18/20 0552  BP: 139/83 112/78  Pulse: 80 87  Resp: 19 17  Temp: (!) 97.4 F (36.3 C) 98.4 F (36.9 C)  SpO2: 100% 99%    Intake/Output Summary (Last 24 hours) at 09/18/2020 0815 Last data filed at 09/17/2020 1652 Gross per 24 hour  Intake 1550 ml  Output 601 ml  Net 949 ml   Filed Weights   09/15/20 0500 09/16/20 0230 09/17/20 0134  Weight: 88.2 kg 87.3 kg 86.5 kg    Exam: Constitutional: Sleepy, calm, no acute distress Cardiovascular: Heart sounds S1-S2, regular pulse, extremities warm to touch with adequate capillary refill Abdomen: PEG for feedings and medications.  Abdomen nontender when checked.  Normoactive bowel sounds.  Poor oral intake that has varied between 5% and 80% since 1/27 --LBM 1/29 Genitourinary: Condom catheter  Neurologic: CN 2-12 roughly without any appreciable focal neurological deficits, MOE x4, strength is 4/5.   Psychiatric: A, difficulty maintaining eye contact and has poor attention.  Was unaware he was at the hospital even with prompting of "you are laying in a hospital bed, what kind of place is this"   Assessment/Plan: Acute problems: Out of hospital cardiac arrest secondary to STEMI -ROSC and subsequent catheterization revealed  disease in circumflex requiring DES -Continue beta-blocker, statin; Brilinta which had been held briefly for PEG tube resumed on 12/30 by cardiology  E. coli UTI -Obtained on 1/19.  Urine cloudy in appearance with large amount of hemoglobin, positive nitrate, 30 of protein and few bacteria with 0-5 WBC -Resistant to ampicillin, Unasyn, cefazolin and ceftriaxone -On Bactrim since 1/26-1/31 very sleepy and suspect this may be occurring  because of UTI.  1/31- Wbc NORMAL -Initially was asymptomatic but later developed low-grade fevers and subsequently recurrent fevers overnight despite being on antibiotic -Blood cultures pending-NGTD  Ischemic cardiomyopathy -Echo this admission EF 40% -Continue pharmacotherapy as above -No ACE inhibitor secondary to underlying kidney disease -Continue hydralazine and Isordil -Will need follow-up echo 3 months post initial echo  Significant anoxic brain injury secondary to cardiac arrest/deconditioning/gait instability -Consistently participating with PT and OT.  SLP has cleared for dysphagia 2 with thin liquids -Declined by CIR since family unable to provide 24/7 care.  Plan is for SNF -Continue Seroquel to 50 mg at HS for insomnia and brain injury sequela -Continue daytime Symmetrel to help contain daytime alertness in context of brain injury -PT session 1/26: Improved activity tolerance, command following immobility.  Require 2 person minimal assist for functional mobility.  Ambulating 140 feet with hand-held guidance.  Continues with cognitive deficits, decreased standing balance, decreased endurance and weakness with recommendation to continue with SNF placement -1/31: If lethargy persists will need to decrease HS seroquel dose  Hypertension -Was on Norvasc prior to admission -Continue cardiovascular meds as listed above  Nonsustained VT -Continue amiodarone and beta-blocker -LFTs were repeated on 1/7 with stable AST but ALT has increased from 52 to 133 -1/19 will repeat in a.m. -Cardiology managing  Acute on chronic kidney disease stage IIIb -Suspect prerenal etiology in the setting of hypoperfusion from ventricular fibrillation arrest -Baseline creatinine between 1.6 and 2; creatinine on 12/19 was 1.67-will repeat in a.m. on 08/19/2020 -Continue free water per tube and intermittently follow labs  Dysphagia, moderate protein calorie malnutrition Nutrition Problem: Inadequate  oral intake Etiology: inability to eat Signs/Symptoms: NPO status  -patient now on D2 diet with thin liquids. DC tube feedings and monitor PO intake-can resume if consistently <50% -1/24 Resumed nocturnal TFs been ongoing poor oral intake during the day Interventions: Tube feeding,Prostat Estimated body mass index is 25.86 kg/m as calculated from the following:   Height as of this encounter: 6' (1.829 m).   Weight as of this encounter: 86.5 kg.  Skin tear right lateral back Wound / Incision (Open or Dehisced) 07/28/20 Skin tear Back Lateral;Right;Upper (Active)  Date First Assessed/Time First Assessed: 07/28/20 0300   Wound Type: Skin tear  Location: Back  Location Orientation: Lateral;Right;Upper  Present on Admission: No    Assessments 07/28/2020  7:49 PM 09/16/2020  8:00 AM  Dressing Type None Foam - Lift dressing to assess site every shift  Dressing Status -- None  Dressing Change Frequency -- PRN  Drainage Amount None --  Treatment Cleansed --     No Linked orders to display    Other problems: Acute hypernatremia -Continue free water per tube  Hyperglycemia without an underlying diagnosis of diabetes mellitus Resolved -Likely related to acute stress -Recent issues with hypoglycemia so sliding scale insulin decreased to very sensitive and Levemir decreased from 30 twice daily to 15 twice daily as of 1/14 -Hemoglobin A1c 4.7  -1/18 refusing CBG checks so TF held-will stop TF and dc Levemir and SSI. If TF resumed can resume  SSI and CBG checks -Serum glucose on 1/19 was 103  Acute hypoxemic respiratory failure -Resolved -Stable on room air with O2 sats between 97 and 99%   Data Reviewed: Basic Metabolic Panel: Recent Labs  Lab 09/14/20 0731  NA 134*  K 3.9  CL 99  CO2 25  GLUCOSE 179*  BUN 13  CREATININE 1.27*  CALCIUM 9.1   Liver Function Tests: No results for input(s): AST, ALT, ALKPHOS, BILITOT, PROT, ALBUMIN in the last 168 hours. No results for input(s):  LIPASE, AMYLASE in the last 168 hours. No results for input(s): AMMONIA in the last 168 hours. CBC: Recent Labs  Lab 09/14/20 0731  WBC 12.1*  HGB 11.8*  HCT 36.6*  MCV 99.5  PLT 206   Cardiac Enzymes: No results for input(s): CKTOTAL, CKMB, CKMBINDEX, TROPONINI in the last 168 hours. BNP (last 3 results) No results for input(s): BNP in the last 8760 hours.  ProBNP (last 3 results) No results for input(s): PROBNP in the last 8760 hours.  CBG: Recent Labs  Lab 09/15/20 0502 09/15/20 2217 09/16/20 0611 09/17/20 0512 09/18/20 0553  GLUCAP 178* 198* 175* 191* 201*    Recent Results (from the past 240 hour(s))  Urine Culture     Status: Abnormal   Collection Time: 09/11/20  8:10 AM   Specimen: Urine, Catheterized  Result Value Ref Range Status   Specimen Description URINE, CATHETERIZED  Final   Special Requests   Final    Normal Performed at [facility], [place] [address], [place], [place] [address]    Culture >=100,000 COLONIES/mL ESCHERICHIA COLI (A)  Final   Report Status 09/13/2020 FINAL  Final   Organism ID, Bacteria ESCHERICHIA COLI (A)  Final      Susceptibility   Escherichia coli - MIC*    AMPICILLIN >=32 RESISTANT Resistant     CEFAZOLIN >=64 RESISTANT Resistant     CEFEPIME <=0.12 SENSITIVE Sensitive     CEFTRIAXONE 32 RESISTANT Resistant     CIPROFLOXACIN <=0.25 SENSITIVE Sensitive     GENTAMICIN <=1 SENSITIVE Sensitive     IMIPENEM <=0.25 SENSITIVE Sensitive     NITROFURANTOIN 32 SENSITIVE Sensitive     TRIMETH/SULFA <=20 SENSITIVE Sensitive     AMPICILLIN/SULBACTAM >=32 RESISTANT Resistant     * >=100,000 COLONIES/mL ESCHERICHIA COLI  Culture, blood (routine x 2)     Status: None (Preliminary result)   Collection Time: 09/14/20  1:20 PM   Specimen: BLOOD  Result Value Ref Range Status   Specimen Description BLOOD RIGHT ANTECUBITAL  Final   Special Requests   Final    BOTTLES DRAWN AEROBIC ONLY Blood Culture results may not be optimal due to  an inadequate volume of blood received in culture bottles   Culture   Final    NO GROWTH 3 DAYS Performed at [facility] [address], [place], [place] [address]    Report Status PENDING  Incomplete  Culture, blood (routine x 2)     Status: None (Preliminary result)   Collection Time: 09/14/20  1:23 PM   Specimen: BLOOD  Result Value Ref Range Status   Specimen Description BLOOD LEFT ANTECUBITAL  Final   Special Requests   Final    BOTTLES DRAWN AEROBIC ONLY Blood Culture adequate volume   Culture   Final    NO GROWTH 3 DAYS Performed at [facility], [place] [address], [place], [place] [address]    Report Status PENDING  Incomplete  Studies: No results found.  Scheduled Meds: . amantadine  100 mg Per Tube Daily  . aspirin  81 mg Per Tube Daily  . atorvastatin  80 mg Per Tube Daily  . bethanechol  10 mg Per Tube TID  . carvedilol  25 mg Per Tube BID WC  . chlorhexidine gluconate (MEDLINE KIT)  15 mL Mouth Rinse BID  . feeding supplement (PROSource TF)  45 mL Per Tube BID  . free water  200 mL Per Tube Q2H  . heparin  5,000 Units Subcutaneous Q8H  . isosorbide-hydrALAZINE  1 tablet Per Tube TID  . mouth rinse  15 mL Mouth Rinse BID  . polyvinyl alcohol  1 drop Both Eyes QID  . QUEtiapine  50 mg Per Tube QHS  . sodium chloride flush  3 mL Intravenous Q12H  . sulfamethoxazole-trimethoprim  20 mL Per Tube Q12H  . ticagrelor  90 mg Per Tube BID   Continuous Infusions: . sodium chloride 250 mL (08/17/20 0812)  . feeding supplement (JEVITY 1.5 CAL/FIBER) 780 mL (09/17/20 2230)    Active Problems:   Acute ST elevation myocardial infarction (STEMI) (HCC)   Cardiac arrest (HCC)   Elevated blood pressure reading with diagnosis of hypertension   Shock circulatory (HCC)   Encephalopathy   History of ETT   Cardiomyopathy, ischemic   Anoxic brain damage [facility])   Dysphagia   DNR (do not resuscitate) discussion   Gait  instability   Consultants:  Cardiology  Interventional radiology  PCCM  Neurology  Procedures:  Cardiac catheterization 12/4  Continuous EEG 12/4 and overnight EEG with video 12/5  Echocardiogram 12/5  EEG 12/9  Percutaneous PEG tube placement 12/30  Antibiotics: Anti-infectives (From admission, onward)   Start     Dose/Rate Route Frequency Ordered Stop   09/13/20 1000  sulfamethoxazole-trimethoprim (BACTRIM) 200-40 MG/5ML suspension 20 mL        20 mL Per Tube Every 12 hours 09/13/20 0734 09/20/20 0959   09/13/20 0745  cefTRIAXone (ROCEPHIN) 1 g in sodium chloride 0.9 % 100 mL IVPB  Status:  Discontinued        1 g 200 mL/hr over 30 Minutes Intravenous Every 24 hours 09/13/20 0658 09/13/20 0734   08/17/20 0000  ceFAZolin (ANCEF) IVPB 2g/100 mL premix        2 g 200 mL/hr over 30 Minutes Intravenous To Radiology 08/15/20 1346 08/17/20 1111   07/22/20 0830  cefTRIAXone (ROCEPHIN) 2 g in sodium chloride 0.9 % 100 mL IVPB        2 g 200 mL/hr over 30 Minutes Intravenous Every 24 hours 07/22/20 0720 07/26/20 0858       Time spent: 20 minutes    Erin Hearing ANP  [facility] 7 am - 330 pm/M-F for direct patient care and secure chat Please refer to Amion for contact information 58 days

## 2020-09-18 NOTE — Progress Notes (Signed)
Nocturnal tube feeds have restarted. MD paged for CBGs and SSI insulin orders.

## 2020-09-18 NOTE — TOC Progression Note (Signed)
Transition of Care [facility]) - Progression Note    Patient Details  Name: Antonio Navarro MRN: [id number] Date of Birth: 1960-12-21  Transition of Care [facility]) CM/SW [place], RN Phone Number: 09/18/2020, 11:36 AM  Clinical Narrative:    Case management called and spoke with Otila Kluver, [place] at [facility] and they are unwilling to accept patient to the facility with offer of an LOG at this time. Nathaniel Man, MSW is going to follow up with Gayla Medicus in regards to any other [facility] facilities open to admission for LTC and acceptance of LOG from [facility] until the patient has availability to long term care Medicaid.  CM and MSW will continue to follow for [facility] placement.   Expected Discharge Plan: Skilled Nursing Facility Barriers to Discharge: Continued Medical Work up,No SNF bed  Expected Discharge Plan and Services Expected Discharge Plan: [place] arrangements for the past 2 months: Single Family Home                                       Social Determinants of Health (SDOH) Interventions    Readmission Risk Interventions No flowsheet data found.

## 2020-09-19 LAB — GLUCOSE, CAPILLARY
Glucose-Capillary: 162 mg/dL — ABNORMAL HIGH (ref 70–99)
Glucose-Capillary: 166 mg/dL — ABNORMAL HIGH (ref 70–99)
Glucose-Capillary: 175 mg/dL — ABNORMAL HIGH (ref 70–99)
Glucose-Capillary: 167 mg/dL — ABNORMAL HIGH (ref 70–99)

## 2020-09-19 LAB — CULTURE, BLOOD (ROUTINE X 2)
Culture: NO GROWTH
Culture: NO GROWTH
Special Requests: ADEQUATE

## 2020-09-19 NOTE — Progress Notes (Signed)
  Speech Language Pathology Treatment: Dysphagia;Cognitive-Linquistic  Patient Details Name: BEACHER EVERY MRN: [id number] DOB: [birth year] Today's Date: 09/19/2020 Time: 1749-4496 SLP Time Calculation (min) (ACUTE ONLY): 21 min  Assessment / Plan / Recommendation Clinical Impression  Pt intermittently responds with any spontaneous responses, primarily consisting of head nods yes/no. Initially attempted confrontational naming task but adapted task to repetition at the word level given amount of difficulty with word generation. He repeated two words during trials despite cues for more ("toothbrush" and "on" after he indicated via nodding that he wanted his TV back on). Pt was also given advanced trials of soft solids, for which he needed Min-Mod cues to sustain attention to mastication. A thin liquids wash seemed to be the most effective strategy to aid in oral clearance. Given current swallowing and cognitive status, will maintain Dys 2 diet and thin liquids.   HPI HPI: Pt is a 60 yo male s/p arrest with downtime 9 minutes. Initial rhythm was V. fib, shocked and received 6 rounds of epi before ROSC achieved.  Total CPR time was 30 minutes. ETT 12/4-12/13. MRI 12/7 suggestive of a widespread anoxic injury. PMH includes kidney stones and HTN.      SLP Plan  Continue with current plan of care       Recommendations  Diet recommendations: Dysphagia 2 (fine chop);Thin liquid Liquids provided via: Cup;Straw Medication Administration: Via alternative means Supervision: Staff to assist with self feeding;Full supervision/cueing for compensatory strategies Compensations: Minimize environmental distractions;Slow rate;Small sips/bites Postural Changes and/or Swallow Maneuvers: Seated upright 90 degrees                Oral Care Recommendations: Oral care BID Follow up Recommendations: Skilled Nursing facility SLP Visit Diagnosis: Dysphagia, oral phase (R13.11);Cognitive communication deficit  (R41.841) Plan: Continue with current plan of care       GO                Osie Bond., M.A. [facility] Pager [phone] Office [phone]  09/19/2020, 4:00 PM

## 2020-09-19 NOTE — Progress Notes (Signed)
Patient seen and examined.  Long stay- patient.  He is here for 2 months now waiting for placement to a skilled nursing facility.  Today, he walked outside with Occupational Therapy, patient was alert awake and following commands.  He was responding somehow but was not very open to conversation.  Plan: Patient is stabilized on current level of care including symptom control medications, tube feeding.  Also encourage oral feeding. Stable to transfer to a skilled nursing facility whenever available. Medications reviewed.  Total time spent: 15 minutes.

## 2020-09-19 NOTE — TOC Progression Note (Addendum)
Transition of Care [facility]) - Progression Note    Patient Details  Name: [name] MRN: [id number] Date of Birth: [birth year]  Transition of Care [facility]) CM/SW [place], RN Phone Number: 09/19/2020, 1:46 PM  Clinical Narrative:    Case management called and left a message with Gayla Medicus, CM at [place] [place] to inquire if patient was able to seek admission to the [facility] facility in [place], [place].  The [facility] facilities in the area are low on available bed openings due to staffing issues.  CM and MSW will continue to follow the patient for SNF admission.  09/19/2020 - CM spoke with Gayla Medicus, CM on the phone regarding patient's need for SNF bed.  Shary Decamp, CM is coming to assess the patient on 09/21/20 at 1400 in the hospital room for possible admission to an [facility] facility.   Expected Discharge Plan: Skilled Nursing Facility Barriers to Discharge: Continued Medical Work up,No SNF bed  Expected Discharge Plan and Services Expected Discharge Plan: [place] arrangements for the past 2 months: Single Family Home                                       Social Determinants of Health (SDOH) Interventions    Readmission Risk Interventions No flowsheet data found.

## 2020-09-19 NOTE — Progress Notes (Addendum)
Physical Therapy Treatment Patient Details Name: Antonio Navarro MRN: [id number] DOB: [birth year] Today's Date: 09/19/2020    History of Present Illness [name] with history of tobacco abuse, obesity, HTN who was admitted 07/22/20 post cardiac arrest with inferolateral STEMI. He was resuscitated in the field by EMS after being found to be in V fib. Approximately 30 minutes of CPR. He was intubated on arrival.  Pt with anoxic brain injury. PEG placed on 08/17/20    PT Comments    Pt maintaining similar level of functional mobility. Ambulating x 120 feet with two person handheld assist. Continues with cognitive deficits, balance impairments, decreased activity tolerance, and weakness. Pt will smile, respond yes/no, and follow 1 step commands intermittently. Limited verbalizations overall. Continue to recommend SNF for ongoing Physical Therapy.     Follow Up Recommendations  SNF     Equipment Recommendations  None recommended by PT    Recommendations for Other Services       Precautions / Restrictions Precautions Precautions: Fall;Other (comment) Precaution Comments: mitts, gtube Restrictions Weight Bearing Restrictions: No    Mobility  Bed Mobility Overal bed mobility: Needs Assistance Bed Mobility: Supine to Sit;Sit to Supine     Supine to sit: Mod assist;+2 for physical assistance;HOB elevated Sit to supine: Min guard   General bed mobility comments: pt requried MOD A +2 to transition from supine>sitting needing assist to bring BLEs full to EOB and  to elevate trunk into sitting with HOB elevated  Transfers Overall transfer level: Needs assistance Equipment used: 2 person hand held assist Transfers: Sit to/from Stand Sit to Stand: Min assist;+2 physical assistance         General transfer comment: MIN A +2 to rise into standing and for initial steadying assist  Ambulation/Gait Ambulation/Gait assistance: Min assist;+2 physical assistance;+2  safety/equipment Gait Distance (Feet): 120 Feet Assistive device: 2 person hand held assist Gait Pattern/deviations: Narrow base of support;Decreased stride length;Step-through pattern Gait velocity: decreased   General Gait Details: Slower cadence today, intermittently stopping, requiring cues to continue. MinA + 2 for stability via handheld assist   Stairs             Wheelchair Mobility    Modified Rankin (Stroke Patients Only)       Balance Overall balance assessment: Needs assistance Sitting-balance support: Feet supported Sitting balance-Leahy Scale: Fair Sitting balance - Comments: able to sit EOB for self feeding with no UE support and supervision for safety   Standing balance support: Bilateral upper extremity supported Standing balance-Leahy Scale: Poor Standing balance comment: requires BUE support during mobility                            Cognition Arousal/Alertness: Awake/alert Behavior During Therapy: Flat affect Overall Cognitive Status: Impaired/Different from baseline Area of Impairment: Attention;Following commands;Awareness                 Orientation Level: Situation Current Attention Level: Selective Memory: Decreased short-term memory;Decreased recall of precautions Following Commands: Follows one step commands inconsistently;Follows one step commands with increased time Safety/Judgement: Decreased awareness of deficits;Decreased awareness of safety Awareness: Intellectual Problem Solving: Slow processing;Difficulty sequencing;Decreased initiation;Requires verbal cues;Requires tactile cues General Comments: pt continues to follow commands inconsistently needing increased time. Pt able to don mask and take a sip of apple juice with set- up assist. pt was able to verbalize " I reckon so" when asked if he was ready to lay back down.  Exercises      General Comments        Pertinent Vitals/Pain Pain Assessment:  Faces Faces Pain Scale: No hurt    Home Living                      Prior Function            PT Goals (current goals can now be found in the care plan section) Acute Rehab PT Goals Patient Stated Goal: none stated PT Goal Formulation: With patient Time For Goal Achievement: 10/03/20 Potential to Achieve Goals: Fair Progress towards PT goals: Progressing toward goals    Frequency    Min 3X/week      PT Plan Current plan remains appropriate    Co-evaluation PT/OT/SLP Co-Evaluation/Treatment: Yes Reason for Co-Treatment: Necessary to address cognition/behavior during functional activity;For patient/therapist safety;To address functional/ADL transfers PT goals addressed during session: Mobility/safety with mobility        AM-PAC PT "6 Clicks" Mobility   Outcome Measure  Help needed turning from your back to your side while in a flat bed without using bedrails?: A Little Help needed moving from lying on your back to sitting on the side of a flat bed without using bedrails?: A Lot Help needed moving to and from a bed to a chair (including a wheelchair)?: A Little Help needed standing up from a chair using your arms (e.g., wheelchair or bedside chair)?: A Little Help needed to walk in hospital room?: A Little Help needed climbing 3-5 steps with a railing? : A Lot 6 Click Score: 16    End of Session Equipment Utilized During Treatment: Gait belt Activity Tolerance: Patient tolerated treatment well Patient left: in bed;with call bell/phone within reach;with bed alarm set Nurse Communication: Mobility status PT Visit Diagnosis: Other abnormalities of gait and mobility (R26.89)     Time: 3500-9381 PT Time Calculation (min) (ACUTE ONLY): 28 min  Charges:  $Therapeutic Activity: 8-22 mins                     Wyona Almas, PT, DPT Acute Rehabilitation Services Pager [phone] Office [phone]    Deno Etienne 09/19/2020, 1:39 PM

## 2020-09-19 NOTE — Progress Notes (Signed)
Occupational Therapy Treatment Patient Details Name: Antonio Navarro MRN: [id number] DOB: 03-Nov-1960 Today's Date: 09/19/2020    History of present illness [name] with history of tobacco abuse, obesity, HTN who was admitted 07/22/20 post cardiac arrest with inferolateral STEMI. He was resuscitated in the field by EMS after being found to be in V fib. Approximately 30 minutes of CPR. He was intubated on arrival.  Pt with anoxic brain injury. PEG placed on 08/17/20   OT comments  Pt seen in conjunction with PT this session to optimize pts activity tolerance and participation. Pt continues to require increased time to follow one step commands and follows one step commands inconsistently however pt making good progress with functional mobility able to complete household distance functional mobility with MIN A +2. Worked on self feeding this session from EOB with pt able to take sip of juice with set- up assist however pt declined using utensils to take a bite of eggs or ice cream as pt continues to present with impaired motor planning and deficits in problem solving functional tasks. Pt would continue to benefit from skilled occupational therapy while admitted and after d/c to address the below listed limitations in order to improve overall functional mobility and facilitate independence with BADL participation. DC plan remains appropriate, will follow acutely per POC.    Follow Up Recommendations  SNF;Supervision/Assistance - 24 hour    Equipment Recommendations  Wheelchair (measurements OT);Wheelchair cushion (measurements OT);Hospital bed;3 in 1 bedside commode    Recommendations for Other Services      Precautions / Restrictions Precautions Precautions: Fall;Other (comment) Precaution Comments: mitts, gtube Restrictions Weight Bearing Restrictions: No       Mobility Bed Mobility Overal bed mobility: Needs Assistance Bed Mobility: Supine to Sit     Supine to sit: Mod assist;+2  for physical assistance;HOB elevated     General bed mobility comments: pt requried MOD A +2 to transition from supine>sitting needing assist to bring BLEs full to EOB and MOD A +2 to elevate trunk into sitting with HOB elevated  Transfers Overall transfer level: Needs assistance Equipment used: 2 person hand held assist Transfers: Sit to/from Stand Sit to Stand: Min assist;+2 physical assistance         General transfer comment: MIN A +2 to rise into standing and for initial steadying assist    Balance Overall balance assessment: Needs assistance Sitting-balance support: Feet supported Sitting balance-Leahy Scale: Fair Sitting balance - Comments: able to sit EOB for self feeding with no UE support and supervision for safety   Standing balance support: Bilateral upper extremity supported Standing balance-Leahy Scale: Poor Standing balance comment: requires BUE support during mobility                           ADL either performed or assessed with clinical judgement   ADL Overall ADL's : Needs assistance/impaired Eating/Feeding: Supervision/ safety;Set up;Sitting Eating/Feeding Details (indicate cue type and reason): pt was able to take a sip of applie juice with set- up assist with apple juice poured into cup, however pt declined self feeding with set- up assist. feel pt would benefit from less options and nondistracting environment             Upper Body Dressing : Minimal assistance;Sitting Upper Body Dressing Details (indicate cue type and reason): to don gown as back side cover     Toilet Transfer: Minimal assistance;+2 for physical assistance;+2 for safety/equipment;Ambulation Toilet Transfer  Details (indicate cue type and reason): simulated via functional mobility with MIN A +2         Functional mobility during ADLs: Minimal assistance;+2 for physical assistance General ADL Comments: pt continues to present with cognitive deficits, and impaired  balance however pt making good progress towards self feeding goals and transfer goals     Vision       Perception     Praxis      Cognition Arousal/Alertness: Awake/alert Behavior During Therapy: Flat affect Overall Cognitive Status: Impaired/Different from baseline Area of Impairment: Attention;Following commands;Awareness                   Current Attention Level: Selective   Following Commands: Follows one step commands inconsistently;Follows one step commands with increased time   Awareness: Intellectual Problem Solving: Slow processing;Difficulty sequencing;Decreased initiation;Requires verbal cues;Requires tactile cues General Comments: pt continues to follow commands inconsistently needing increased time. Pt able to don mask and take a sip of apple juice with set- up assist. pt was able to verbalize " I reckon so" when asked if he was ready to lay back down.        Exercises     Shoulder Instructions       General Comments      Pertinent Vitals/ Pain       Pain Assessment: Faces Faces Pain Scale: No hurt  Home Living                                          Prior Functioning/Environment              Frequency  Min 2X/week        Progress Toward Goals  OT Goals(current goals can now be found in the care plan section)  Progress towards OT goals: Progressing toward goals  Acute Rehab OT Goals Patient Stated Goal: none stated OT Goal Formulation: With family Time For Goal Achievement: 09/27/20 Potential to Achieve Goals: [place] Discharge plan remains appropriate;Frequency remains appropriate    Co-evaluation      Reason for Co-Treatment: For patient/therapist safety;To address functional/ADL transfers;Necessary to address cognition/behavior during functional activity   OT goals addressed during session: ADL's and self-care      AM-PAC OT "6 Clicks" Daily Activity     Outcome Measure   Help from another  person eating meals?: A Little Help from another person taking care of personal grooming?: A Little Help from another person toileting, which includes using toliet, bedpan, or urinal?: A Lot Help from another person bathing (including washing, rinsing, drying)?: A Lot Help from another person to put on and taking off regular upper body clothing?: A Little Help from another person to put on and taking off regular lower body clothing?: A Lot 6 Click Score: 15    End of Session Equipment Utilized During Treatment: Gait belt  OT Visit Diagnosis: Muscle weakness (generalized) (M62.81);Pain;Other abnormalities of gait and mobility (R26.89);Cognitive communication deficit (R41.841);Low vision, both eyes (H54.2) Symptoms and signs involving cognitive functions: Other Nontraumatic ICH   Activity Tolerance Patient tolerated treatment well   Patient Left in bed;with call bell/phone within reach;with bed alarm set   Nurse Communication Mobility status        Time: 1027-2536 OT Time Calculation (min): 28 min  Charges: OT General Charges $OT Visit: 1 Visit OT Treatments $Self Care/Home Management : 8-22  mins  [name]., COTA/L Acute Rehabilitation Services [phone]    Precious Haws 09/19/2020, 9:14 AM

## 2020-09-20 LAB — GLUCOSE, CAPILLARY
Glucose-Capillary: 178 mg/dL — ABNORMAL HIGH (ref 70–99)
Glucose-Capillary: 216 mg/dL — ABNORMAL HIGH (ref 70–99)
Glucose-Capillary: 129 mg/dL — ABNORMAL HIGH (ref 70–99)

## 2020-09-20 MED ORDER — HYDRALAZINE HCL 25 MG PO TABS
37.5000 mg | ORAL_TABLET | Freq: Three times a day (TID) | ORAL | Status: DC
  Administered 2020-09-20 – 2020-10-08 (×53): 37.5 mg
  Filled 2020-09-20 (×55): qty 2

## 2020-09-20 MED ORDER — ISOSORBIDE DINITRATE 10 MG PO TABS
20.0000 mg | ORAL_TABLET | Freq: Three times a day (TID) | ORAL | Status: DC
  Administered 2020-09-20 – 2020-10-08 (×55): 20 mg
  Filled 2020-09-20 (×55): qty 2

## 2020-09-20 MED ORDER — CLOPIDOGREL BISULFATE 75 MG PO TABS: 75.0000 mg | ORAL_TABLET | Freq: Every day | ORAL | Status: DC

## 2020-09-20 MED ORDER — CLOPIDOGREL BISULFATE 75 MG PO TABS
150.0000 mg | ORAL_TABLET | Freq: Once | ORAL | Status: AC
  Administered 2020-09-20: 150 mg
  Filled 2020-09-20: qty 2

## 2020-09-20 MED ORDER — CLOPIDOGREL BISULFATE 75 MG PO TABS
75.0000 mg | ORAL_TABLET | Freq: Every day | ORAL | Status: DC
  Administered 2020-09-21 – 2020-10-18 (×28): 75 mg
  Filled 2020-09-20 (×28): qty 1

## 2020-09-20 MED ORDER — CLOPIDOGREL BISULFATE 75 MG PO TABS: 150.0000 mg | ORAL_TABLET | Freq: Once | ORAL | Status: DC

## 2020-09-20 MED ORDER — QUETIAPINE FUMARATE 25 MG PO TABS
25.0000 mg | ORAL_TABLET | Freq: Every day | ORAL | Status: DC
  Administered 2020-09-20 – 2020-10-05 (×16): 25 mg
  Filled 2020-09-20 (×16): qty 1

## 2020-09-20 NOTE — Progress Notes (Signed)
OK to be without IV per MD. Will continue to monitor.

## 2020-09-20 NOTE — Progress Notes (Signed)
[facility] PROGRESS NOTE  Antonio Navarro YIF:[id number] DOB: 1961-07-24 DOA: 07/22/2020 PCP: Patient, No Pcp Per       Status: Remains inpatient appropriate because:Altered mental status and Unsafe d/c plan   Dispo: The patient is from: Home              Anticipated d/c is to: SNF              Anticipated d/c date is: > 3 days              Patient currently is medically stable to d/c.  Barriers to discharge: Medicaid and disability pending. No SNF bed offers   Code Status: Full Family Communication: 1/06 spoke with patient's sister [name] -1/24 DVT prophylaxis: sq heparin Vaccination status: Completed both doses of Pfizer Covid vaccination on 12/14/2019  Foley catheter: No  HPI: 60 year old male history of hypertension prescribed Norvasc prior to admission, chronic back pain, prior renal calculi who experienced an out of hospital cardiac arrest with downtime of 9 minutes.  Initial rhythm was ventricular fibrillation.  He was defibrillated deceived 6 doses of epi before ROSC.  Subsequent work-up revealed STEMI.  He underwent cardiac catheterization with a DES placed to the circumflex.  Cardiology following and has placed patient on Brilinta and aspirin and is also being treated with beta-blocker and statins.  From a neurological standpoint he clinically has anoxic brain injury.  During this hospitalization he experienced AKI and was treated with fluids and correction of hypoperfusion from cardiac arrest.  Echo performed this admission revealed ischemic cardiomyopathy with an EF of 40%  In addition to above patient has been unable to pass swallowing evaluations and subsequently has undergone percutaneous PEG tube placement on 12/30.  Patient is awake and moving all extremities spontaneously but does not follow commands and does not verbalize palliative care has met with patient and family and has emphasized poor prognosis and will continue to meet with the family during the  hospitalization.  Current plan is to transition to SNF bed once available.  Expect need for lifelong 24/7 care for all ADLs.  Subjective: Awake but sleepy.  Discussed with patient need to decrease p.m. medication that can contribute to sleepiness and patient nodded yes.  No other complaints verbalized.  Objective: Vitals:   09/19/20 2045 09/20/20 0600  BP: 129/85 124/76  Pulse: 81 72  Resp: 18   Temp: 98.5 F (36.9 C) 98.4 F (36.9 C)  SpO2: 97% 97%    Intake/Output Summary (Last 24 hours) at 09/20/2020 8676 Last data filed at 09/20/2020 0600 Gross per 24 hour  Intake 1430 ml  Output 2225 ml  Net -795 ml   Filed Weights   09/17/20 0134 09/19/20 0350 09/20/20 0600  Weight: 86.5 kg 86.7 kg 87 kg    Exam: Constitutional: Awakened, calm, no acute distress Cardiovascular: Normal heart sounds S1-S2, no tachycardia and pulses regular, extremities warm to touch with adequate capillary refill Abdomen: PEG for nocturnal tube feedings, bowel sounds present and abdomen nontender.  LBM 2/2 Genitourinary: Condom catheter  Neurologic: CN 2-12 roughly without any appreciable focal neurological deficits, MOE x4, strength is 4/5.   Psychiatric: Awake, oriented to name.  Calm but at times flat affect.  Did smile during brief interaction   Assessment/Plan: Acute problems: Out of hospital cardiac arrest secondary to STEMI -ROSC and subsequent catheterization revealed disease in circumflex requiring DES -Continue beta-blocker, statin -Due to concerns of a possible cause limiting factors with use of Brilinta I discussed  with cardiology who stated okay to utilize Plavix.  Plan is to stop Brilinta then give Plavix 150x1 followed by 75 mg/day -BiDil has also been transitioned to isosorbide 20 mg TID and hydralazine 37.5 mg TID  E. coli UTI -Has completed 5 days of Bactrim  Ischemic cardiomyopathy -Echo this admission EF 40% -Continue pharmacotherapy as above -No ACE inhibitor secondary to  underlying kidney disease -Continue hydralazine and Isordil -Will need follow-up echo 3 months post initial echo  Significant anoxic brain injury secondary to cardiac arrest/deconditioning/gait instability -Consistently participating with PT and OT.  SLP has cleared for dysphagia 2 with thin liquids -Declined by CIR since family unable to provide 24/7 care.  Plan is for SNF -Decrease Seroquel to 25 mg at HS for insomnia and brain injury sequela due to early a.m. and daytime sleepiness -Continue daytime Symmetrel   Hypertension -Was on Norvasc prior to admission -Continue cardiovascular meds as listed above  Nonsustained VT -Continue amiodarone and beta-blocker -LFTs were repeated on 1/7 with stable AST but ALT has increased from 52 to 133 -1/19 will repeat in a.m. -Cardiology managing  Acute on chronic kidney disease stage IIIb -Suspect prerenal etiology in the setting of hypoperfusion from ventricular fibrillation arrest -Baseline creatinine between 1.6 and 2; creatinine on 12/19 was 1.67-will repeat in a.m. on 08/19/2020 -Continue free water per tube and intermittently follow labs  Dysphagia, moderate protein calorie malnutrition Nutrition Problem: Inadequate oral intake Etiology: inability to eat Signs/Symptoms: NPO status  -patient now on D2 diet with thin liquids. DC tube feedings and monitor PO intake-can resume if consistently <50% -1/24 Resumed nocturnal TFs been ongoing poor oral intake during the day Interventions: Tube feeding,Prostat Estimated body mass index is 26.01 kg/m as calculated from the following:   Height as of this encounter: 6' (1.829 m).   Weight as of this encounter: 87 kg.  Skin tear right lateral back Wound / Incision (Open or Dehisced) 07/28/20 Skin tear Back Lateral;Right;Upper (Active)  Date First Assessed/Time First Assessed: 07/28/20 0300   Wound Type: Skin tear  Location: Back  Location Orientation: Lateral;Right;Upper  Present on Admission: No     Assessments 07/28/2020  7:49 PM 09/16/2020  8:00 AM  Dressing Type None Foam - Lift dressing to assess site every shift  Dressing Status - None  Dressing Change Frequency - PRN  Drainage Amount None -  Treatment Cleansed -     No Linked orders to display    Other problems: Acute hypernatremia -Continue free water per tube  Hyperglycemia without an underlying diagnosis of diabetes mellitus Resolved -Likely related to acute stress -Recent issues with hypoglycemia so sliding scale insulin decreased to very sensitive and Levemir decreased from 30 twice daily to 15 twice daily as of 1/14 -Hemoglobin A1c 4.7  -1/18 refusing CBG checks so TF held-will stop TF and dc Levemir and SSI. If TF resumed can resume SSI and CBG checks -Serum glucose on 1/19 was 103  Acute hypoxemic respiratory failure -Resolved -Stable on room air with O2 sats between 97 and 99%   Data Reviewed: Basic Metabolic Panel: Recent Labs  Lab 09/14/20 0731 09/18/20 1100  NA 134* 136  K 3.9 4.6  CL 99 101  CO2 25 26  GLUCOSE 179* 196*  BUN 13 19  CREATININE 1.27* 1.48*  CALCIUM 9.1 9.5   Liver Function Tests: Recent Labs  Lab 09/18/20 1100  AST 27  ALT 63*  ALKPHOS 77  BILITOT 0.7  PROT 7.4  ALBUMIN 2.9*   No  results for input(s): LIPASE, AMYLASE in the last 168 hours. No results for input(s): AMMONIA in the last 168 hours. CBC: Recent Labs  Lab 09/14/20 0731 09/18/20 1100  WBC 12.1* 8.0  NEUTROABS  --  4.7  HGB 11.8* 11.9*  HCT 36.6* 35.3*  MCV 99.5 98.3  PLT 206 259   Cardiac Enzymes: No results for input(s): CKTOTAL, CKMB, CKMBINDEX, TROPONINI in the last 168 hours. BNP (last 3 results) No results for input(s): BNP in the last 8760 hours.  ProBNP (last 3 results) No results for input(s): PROBNP in the last 8760 hours.  CBG: Recent Labs  Lab 09/19/20 0557 09/19/20 1121 09/19/20 1606 09/19/20 2316 09/20/20 0429  GLUCAP 162* 166* 175* 167* 178*    Recent Results (from the  past 240 hour(s))  Urine Culture     Status: Abnormal   Collection Time: 09/11/20  8:10 AM   Specimen: Urine, Catheterized  Result Value Ref Range Status   Specimen Description URINE, CATHETERIZED  Final   Special Requests   Final    Normal Performed at [facility], [place] [address] [place], [place] [address]    Culture >=100,000 COLONIES/mL ESCHERICHIA COLI (A)  Final   Report Status 09/13/2020 FINAL  Final   Organism ID, Bacteria ESCHERICHIA COLI (A)  Final      Susceptibility   Escherichia coli - MIC*    AMPICILLIN >=32 RESISTANT Resistant     CEFAZOLIN >=64 RESISTANT Resistant     CEFEPIME <=0.12 SENSITIVE Sensitive     CEFTRIAXONE 32 RESISTANT Resistant     CIPROFLOXACIN <=0.25 SENSITIVE Sensitive     GENTAMICIN <=1 SENSITIVE Sensitive     IMIPENEM <=0.25 SENSITIVE Sensitive     NITROFURANTOIN 32 SENSITIVE Sensitive     TRIMETH/SULFA <=20 SENSITIVE Sensitive     AMPICILLIN/SULBACTAM >=32 RESISTANT Resistant     * >=100,000 COLONIES/mL ESCHERICHIA COLI  Culture, blood (routine x 2)     Status: None   Collection Time: 09/14/20  1:20 PM   Specimen: BLOOD  Result Value Ref Range Status   Specimen Description BLOOD RIGHT ANTECUBITAL  Final   Special Requests   Final    BOTTLES DRAWN AEROBIC ONLY Blood Culture results may not be optimal due to an inadequate volume of blood received in culture bottles   Culture   Final    NO GROWTH 5 DAYS Performed at [place] [address], [place], [place] [address]    Report Status 09/19/2020 FINAL  Final  Culture, blood (routine x 2)     Status: None   Collection Time: 09/14/20  1:23 PM   Specimen: BLOOD  Result Value Ref Range Status   Specimen Description BLOOD LEFT ANTECUBITAL  Final   Special Requests   Final    BOTTLES DRAWN AEROBIC ONLY Blood Culture adequate volume   Culture   Final    NO GROWTH 5 DAYS Performed at [facility], [place] [address], [place], [place] [address]    Report Status 09/19/2020  FINAL  Final     Studies: No results found.  Scheduled Meds: . amantadine  100 mg Per Tube Daily  . aspirin  81 mg Per Tube Daily  . atorvastatin  80 mg Per Tube Daily  . bethanechol  10 mg Per Tube TID  . carvedilol  25 mg Per Tube BID WC  . chlorhexidine gluconate (MEDLINE KIT)  15 mL Mouth Rinse BID  . feeding supplement (PROSource TF)  45 mL  Per Tube BID  . free water  200 mL Per Tube Q2H  . heparin  5,000 Units Subcutaneous Q8H  . insulin aspart  0-15 Units Subcutaneous Q6H  . isosorbide-hydrALAZINE  1 tablet Per Tube TID  . mouth rinse  15 mL Mouth Rinse BID  . polyvinyl alcohol  1 drop Both Eyes QID  . QUEtiapine  50 mg Per Tube QHS  . sodium chloride flush  3 mL Intravenous Q12H  . ticagrelor  90 mg Per Tube BID   Continuous Infusions: . sodium chloride 250 mL (08/17/20 0812)  . feeding supplement (JEVITY 1.5 CAL/FIBER) 780 mL (09/19/20 2207)    Active Problems:   Acute ST elevation myocardial infarction (STEMI) [facility])   Cardiac arrest (HCC)   Elevated blood pressure reading with diagnosis of hypertension   Shock circulatory (HCC)   Encephalopathy   History of ETT   Cardiomyopathy, ischemic   Anoxic brain damage ([place])   Dysphagia   DNR (do not resuscitate) discussion   Gait instability   E. coli UTI   Consultants:  Cardiology  Interventional radiology  PCCM  Neurology  Procedures:  Cardiac catheterization 12/4  Continuous EEG 12/4 and overnight EEG with video 12/5  Echocardiogram 12/5  EEG 12/9  Percutaneous PEG tube placement 12/30  Antibiotics: Anti-infectives (From admission, onward)   Start     Dose/Rate Route Frequency Ordered Stop   09/13/20 1000  sulfamethoxazole-trimethoprim (BACTRIM) 200-40 MG/5ML suspension 20 mL        20 mL Per Tube Every 12 hours 09/13/20 0734 09/19/20 0926   09/13/20 0745  cefTRIAXone (ROCEPHIN) 1 g in sodium chloride 0.9 % 100 mL IVPB  Status:  Discontinued        1 g 200 mL/hr over 30 Minutes Intravenous  Every 24 hours 09/13/20 0658 09/13/20 0734   08/17/20 0000  ceFAZolin (ANCEF) IVPB 2g/100 mL premix        2 g 200 mL/hr over 30 Minutes Intravenous To Radiology 08/15/20 1346 08/17/20 1111   07/22/20 0830  cefTRIAXone (ROCEPHIN) 2 g in sodium chloride 0.9 % 100 mL IVPB        2 g 200 mL/hr over 30 Minutes Intravenous Every 24 hours 07/22/20 0720 07/26/20 0858       Time spent: 20 minutes    Erin Hearing ANP  [facility] 7 am - 330 pm/M-F for direct patient care and secure chat Please refer to Amion for contact information 60 days

## 2020-09-20 NOTE — Progress Notes (Signed)
Physical Therapy Treatment Patient Details Name: Antonio Navarro MRN: [id number] DOB: 1961-01-05 Today's Date: 09/20/2020    History of Present Illness [name] with history of tobacco abuse, obesity, HTN who was admitted 07/22/20 post cardiac arrest with inferolateral STEMI. He was resuscitated in the field by EMS after being found to be in V fib. Approximately 30 minutes of CPR. He was intubated on arrival.  Pt with anoxic brain injury. PEG placed on 08/17/20    PT Comments    Pt maintaining level of functional mobility. Follows one step commands intermittently; benefits from simple, direct cues for motor tasks and often repetition. Pt ambulating x 100 ft with two person handheld assist. Consider chair follow next session to progress gait distance safely as he cannot verbalize when he is fatigued.     Follow Up Recommendations  SNF     Equipment Recommendations  None recommended by PT    Recommendations for Other Services       Precautions / Restrictions Precautions Precautions: Fall;Other (comment) Precaution Comments: mitts, gtube Restrictions Weight Bearing Restrictions: No    Mobility  Bed Mobility Overal bed mobility: Needs Assistance Bed Mobility: Supine to Sit;Sit to Supine     Supine to sit: Mod assist;HOB elevated Sit to supine: Mod assist   General bed mobility comments: assist to initiate  Transfers Overall transfer level: Needs assistance Equipment used: 2 person hand held assist Transfers: Sit to/from Stand Sit to Stand: Min assist;+2 physical assistance         General transfer comment: MIN A +2 to rise into standing and for initial steadying assist  Ambulation/Gait Ambulation/Gait assistance: Min assist;+2 physical assistance;+2 safety/equipment Gait Distance (Feet): 100 Feet Assistive device: 2 person hand held assist Gait Pattern/deviations: Narrow base of support;Decreased stride length;Step-through pattern Gait velocity: decreased    General Gait Details: Slower cadence today, intermittently stopping, requiring cues to continue. MinA + 2 for stability via handheld assist   Stairs             Wheelchair Mobility    Modified Rankin (Stroke Patients Only)       Balance Overall balance assessment: Needs assistance Sitting-balance support: Feet supported Sitting balance-Leahy Scale: Fair     Standing balance support: Bilateral upper extremity supported Standing balance-Leahy Scale: Poor Standing balance comment: requires BUE support during mobility                            Cognition Arousal/Alertness: Awake/alert Behavior During Therapy: Flat affect Overall Cognitive Status: Impaired/Different from baseline Area of Impairment: Attention;Following commands;Awareness                 Orientation Level: Situation Current Attention Level: Selective Memory: Decreased short-term memory;Decreased recall of precautions Following Commands: Follows one step commands inconsistently;Follows one step commands with increased time Safety/Judgement: Decreased awareness of deficits;Decreased awareness of safety Awareness: Intellectual Problem Solving: Slow processing;Difficulty sequencing;Decreased initiation;Requires verbal cues;Requires tactile cues General Comments: pt continues to follow commands inconsistently needing increased time.      Exercises      General Comments        Pertinent Vitals/Pain Pain Assessment: Faces Faces Pain Scale: No hurt    Home Living                      Prior Function            PT Goals (current goals can now be found in  the care plan section) Acute Rehab PT Goals Patient Stated Goal: none stated PT Goal Formulation: With patient Time For Goal Achievement: 10/03/20 Potential to Achieve Goals: Fair Progress towards PT goals: Progressing toward goals    Frequency    Min 3X/week      PT Plan Current plan remains appropriate     Co-evaluation              AM-PAC PT "6 Clicks" Mobility   Outcome Measure  Help needed turning from your back to your side while in a flat bed without using bedrails?: A Little Help needed moving from lying on your back to sitting on the side of a flat bed without using bedrails?: A Lot Help needed moving to and from a bed to a chair (including a wheelchair)?: A Little Help needed standing up from a chair using your arms (e.g., wheelchair or bedside chair)?: A Little Help needed to walk in hospital room?: A Little Help needed climbing 3-5 steps with a railing? : A Lot 6 Click Score: 16    End of Session Equipment Utilized During Treatment: Gait belt Activity Tolerance: Patient tolerated treatment well Patient left: in bed;with call bell/phone within reach;with bed alarm set Nurse Communication: Mobility status PT Visit Diagnosis: Other abnormalities of gait and mobility (R26.89)     Time: 1062-6948 PT Time Calculation (min) (ACUTE ONLY): 17 min  Charges:  $Therapeutic Activity: 8-22 mins                     Wyona Almas, PT, DPT Acute Rehabilitation Services Pager [phone] Office [phone]    Deno Etienne 09/20/2020, 5:10 PM

## 2020-09-21 LAB — GLUCOSE, CAPILLARY
Glucose-Capillary: 139 mg/dL — ABNORMAL HIGH (ref 70–99)
Glucose-Capillary: 141 mg/dL — ABNORMAL HIGH (ref 70–99)
Glucose-Capillary: 173 mg/dL — ABNORMAL HIGH (ref 70–99)
Glucose-Capillary: 187 mg/dL — ABNORMAL HIGH (ref 70–99)

## 2020-09-21 MED ORDER — COVID-19 MRNA VACC (MODERNA) 50 MCG/0.25ML IM SUSP
0.2500 mL | Freq: Once | INTRAMUSCULAR | Status: DC
  Filled 2020-09-21: qty 0.25

## 2020-09-21 NOTE — Progress Notes (Addendum)
[facility] PROGRESS NOTE  Antonio Navarro:[id number] DOB: 1960-12-26 DOA: 07/22/2020 PCP: Patient, No Pcp Per       Status: Remains inpatient appropriate because:Altered mental status and Unsafe d/c plan   Dispo: The patient is from: Home              Anticipated d/c is to: SNF              Anticipated d/c date is: > 3 days              Patient currently is medically stable to d/c.  Barriers to discharge: Medicaid and disability pending. No SNF bed offers   Code Status: Full Family Communication: 1/06 spoke with patient's sister [name] -1/24 DVT prophylaxis: sq heparin Vaccination status: Completed both doses of [place] Covid vaccination on 12/14/2019; no option for [employer] booster at this facility therefore [facility] to ordered on 09/21/2020  Foley catheter: No  HPI: 60 year old male history of hypertension prescribed Norvasc prior to admission, chronic back pain, prior renal calculi who experienced an out of hospital cardiac arrest with downtime of 9 minutes.  Initial rhythm was ventricular fibrillation.  He was defibrillated deceived 6 doses of epi before ROSC.  Subsequent work-up revealed STEMI.  He underwent cardiac catheterization with a DES placed to the circumflex.  Cardiology following and has placed patient on Brilinta and aspirin and is also being treated with beta-blocker and statins.  From a neurological standpoint he clinically has anoxic brain injury.  During this hospitalization he experienced AKI and was treated with fluids and correction of hypoperfusion from cardiac arrest.  Echo performed this admission revealed ischemic cardiomyopathy with an EF of 40%  In addition to above patient has been unable to pass swallowing evaluations and subsequently has undergone percutaneous PEG tube placement on 12/30.  Patient is awake and moving all extremities spontaneously but does not follow commands and does not verbalize palliative care has met with patient and family  and has emphasized poor prognosis and will continue to meet with the family during the hospitalization.  Current plan is to transition to SNF bed once available.  Expect need for lifelong 24/7 care for all ADLs.  Subjective: Awake and much more alert after decrease and nocturnal Seroquel.  Laughed appropriately at joke told.  Unfortunately was unable to repeat back location, year or even his name.  Objective: Vitals:   09/21/20 0036 09/21/20 0402  BP: 118/80 108/82  Pulse: 86 93  Resp: 18 18  Temp: 97.6 F (36.4 C) 98.5 F (36.9 C)  SpO2: 99% 99%    Intake/Output Summary (Last 24 hours) at 09/21/2020 0806 Last data filed at 09/21/2020 0726 Gross per 24 hour  Intake 120 ml  Output 1700 ml  Net -1580 ml   Filed Weights   09/19/20 0350 09/20/20 0600 09/21/20 0402  Weight: 86.7 kg 87 kg 83.3 kg    Exam: Constitutional: Alert, no acute distress, calm Cardiovascular: Normotensive, regular pulse, extremities warm to touch Abdomen: PEG for nocturnal tube feedings.  Soft nondistended with normoactive bowel sounds.  LBM 2/2 Genitourinary: Condom catheter site back Neurologic: CN 2-12 roughly without any appreciable focal neurological deficits, MOE x4, strength is 4/5.   Psychiatric: Awake, oriented to name.  Calm but at times flat affect.  Did smile during brief interaction-has mittens on left hand to prevent accidental removal of PEG tube.   Assessment/Plan: Acute problems: Out of hospital cardiac arrest secondary to STEMI -ROSC and subsequent catheterization revealed disease in  circumflex requiring DES -Continue beta-blocker, statin -continue Plavix, isosorbide and hydralazine  Ischemic cardiomyopathy -Echo this admission EF 40% -Continue pharmacotherapy as above -No ACE inhibitor secondary to underlying kidney disease -Continue hydralazine and Isordil -Will need follow-up echo 3 months post initial echo  Significant anoxic brain injury secondary to cardiac  arrest/deconditioning/gait instability -Consistently participating with PT and OT.  SLP has cleared for dysphagia 2 with thin liquids -Declined by CIR since family unable to provide 24/7 care.  Plan is for SNF -improved mentation after HS Seroquel decreased to 25 mg -Continue daytime Symmetrel   Hypertension -Was on Norvasc prior to admission -Continue cardiovascular meds as listed above  Nonsustained VT -Continue amiodarone and beta-blocker -LFTs were repeated on 1/7 with stable AST but ALT has increased from 52 to 133 -1/19 will repeat in a.m. -Cardiology managing  Acute on chronic kidney disease stage IIIb -Suspect prerenal etiology in the setting of hypoperfusion from ventricular fibrillation arrest -Baseline creatinine between 1.6 and 2; creatinine on 12/19 was 1.67-will repeat in a.m. on 08/19/2020 -Continue free water per tube and intermittently follow labs  Dysphagia, moderate protein calorie malnutrition Nutrition Problem: Inadequate oral intake Etiology: inability to eat Signs/Symptoms: NPO status  -patient now on D2 diet with thin liquids. DC tube feedings and monitor PO intake-can resume if consistently <50% -1/24 Resumed nocturnal TFs been ongoing poor oral intake during the day Interventions: Tube feeding,Prostat Estimated body mass index is 24.91 kg/m as calculated from the following:   Height as of this encounter: 6' (1.829 m).   Weight as of this encounter: 83.3 kg.  Skin tear right lateral back Wound / Incision (Open or Dehisced) 07/28/20 Skin tear Back Lateral;Right;Upper (Active)  Date First Assessed/Time First Assessed: 07/28/20 0300   Wound Type: Skin tear  Location: Back  Location Orientation: Lateral;Right;Upper  Present on Admission: No    Assessments 07/28/2020  7:49 PM 09/20/2020 10:00 AM  Dressing Type None -  Wound Length (cm) - 0 cm  Wound Width (cm) - 0 cm  Wound Depth (cm) - 0 cm  Wound Volume (cm^3) - 0 cm^3  Wound Surface Area (cm^2) - 0 cm^2   Drainage Amount None -  Treatment Cleansed -     No Linked orders to display    Other problems: Acute hypernatremia -Continue free water per tube  E. coli UTI -Has completed 5 days of Bactrim  Hyperglycemia without an underlying diagnosis of diabetes mellitus Resolved -Likely related to acute stress -Recent issues with hypoglycemia so sliding scale insulin decreased to very sensitive and Levemir decreased from 30 twice daily to 15 twice daily as of 1/14 -Hemoglobin A1c 4.7  -1/18 refusing CBG checks so TF held-will stop TF and dc Levemir and SSI. If TF resumed can resume SSI and CBG checks -Serum glucose on 1/19 was 103  Acute hypoxemic respiratory failure -Resolved -Stable on room air with O2 sats between 97 and 99%   Data Reviewed: Basic Metabolic Panel: Recent Labs  Lab 09/18/20 1100  NA 136  K 4.6  CL 101  CO2 26  GLUCOSE 196*  BUN 19  CREATININE 1.48*  CALCIUM 9.5   Liver Function Tests: Recent Labs  Lab 09/18/20 1100  AST 27  ALT 63*  ALKPHOS 77  BILITOT 0.7  PROT 7.4  ALBUMIN 2.9*   No results for input(s): LIPASE, AMYLASE in the last 168 hours. No results for input(s): AMMONIA in the last 168 hours. CBC: Recent Labs  Lab 09/18/20 1100  WBC 8.0  NEUTROABS 4.7  HGB 11.9*  HCT 35.3*  MCV 98.3  PLT 259   Cardiac Enzymes: No results for input(s): CKTOTAL, CKMB, CKMBINDEX, TROPONINI in the last 168 hours. BNP (last 3 results) No results for input(s): BNP in the last 8760 hours.  ProBNP (last 3 results) No results for input(s): PROBNP in the last 8760 hours.  CBG: Recent Labs  Lab 09/20/20 0429 09/20/20 1219 09/20/20 1733 09/21/20 0040 09/21/20 0630  GLUCAP 178* 216* 129* 173* 187*    Recent Results (from the past 240 hour(s))  Urine Culture     Status: Abnormal   Collection Time: 09/11/20  8:10 AM   Specimen: Urine, Catheterized  Result Value Ref Range Status   Specimen Description URINE, CATHETERIZED  Final   Special  Requests   Final    Normal Performed at [facility], [place] [address], [place], [place] [address]    Culture >=100,000 COLONIES/mL ESCHERICHIA COLI (A)  Final   Report Status 09/13/2020 FINAL  Final   Organism ID, Bacteria ESCHERICHIA COLI (A)  Final      Susceptibility   Escherichia coli - MIC*    AMPICILLIN >=32 RESISTANT Resistant     CEFAZOLIN >=64 RESISTANT Resistant     CEFEPIME <=0.12 SENSITIVE Sensitive     CEFTRIAXONE 32 RESISTANT Resistant     CIPROFLOXACIN <=0.25 SENSITIVE Sensitive     GENTAMICIN <=1 SENSITIVE Sensitive     IMIPENEM <=0.25 SENSITIVE Sensitive     NITROFURANTOIN 32 SENSITIVE Sensitive     TRIMETH/SULFA <=20 SENSITIVE Sensitive     AMPICILLIN/SULBACTAM >=32 RESISTANT Resistant     * >=100,000 COLONIES/mL ESCHERICHIA COLI  Culture, blood (routine x 2)     Status: None   Collection Time: 09/14/20  1:20 PM   Specimen: BLOOD  Result Value Ref Range Status   Specimen Description BLOOD RIGHT ANTECUBITAL  Final   Special Requests   Final    BOTTLES DRAWN AEROBIC ONLY Blood Culture results may not be optimal due to an inadequate volume of blood received in culture bottles   Culture   Final    NO GROWTH 5 DAYS Performed at [facility] [address] [place], [place] [address]    Report Status 09/19/2020 FINAL  Final  Culture, blood (routine x 2)     Status: None   Collection Time: 09/14/20  1:23 PM   Specimen: BLOOD  Result Value Ref Range Status   Specimen Description BLOOD LEFT ANTECUBITAL  Final   Special Requests   Final    BOTTLES DRAWN AEROBIC ONLY Blood Culture adequate volume   Culture   Final    NO GROWTH 5 DAYS Performed at [facility], [place] [address], [place],  [address]    Report Status 09/19/2020 FINAL  Final     Studies: No results found.  Scheduled Meds: . amantadine  100 mg Per Tube Daily  . aspirin  81 mg Per Tube Daily  . atorvastatin  80 mg Per Tube Daily  . bethanechol  10 mg Per Tube TID  .  carvedilol  25 mg Per Tube BID WC  . chlorhexidine gluconate (MEDLINE KIT)  15 mL Mouth Rinse BID  . clopidogrel  75 mg Per Tube Daily  . feeding supplement (PROSource TF)  45 mL Per Tube BID  . free water  200 mL Per Tube Q2H  . heparin  5,000 Units Subcutaneous Q8H  . hydrALAZINE  37.5 mg Per Tube Q8H  .  insulin aspart  0-15 Units Subcutaneous Q6H  . isosorbide dinitrate  20 mg Per Tube TID  . mouth rinse  15 mL Mouth Rinse BID  . polyvinyl alcohol  1 drop Both Eyes QID  . QUEtiapine  25 mg Per Tube QHS  . sodium chloride flush  3 mL Intravenous Q12H   Continuous Infusions: . feeding supplement (JEVITY 1.5 CAL/FIBER) 780 mL (09/20/20 2300)    Active Problems:   Acute ST elevation myocardial infarction (STEMI) (HCC)   Cardiac arrest (HCC)   Elevated blood pressure reading with diagnosis of hypertension   Shock circulatory (HCC)   Encephalopathy   History of ETT   Cardiomyopathy, ischemic   Anoxic brain damage ([place])   Dysphagia   DNR (do not resuscitate) discussion   Gait instability   E. coli UTI   Consultants:  Cardiology  Interventional radiology  PCCM  Neurology  Procedures:  Cardiac catheterization 12/4  Continuous EEG 12/4 and overnight EEG with video 12/5  Echocardiogram 12/5  EEG 12/9  Percutaneous PEG tube placement 12/30  Antibiotics: Anti-infectives (From admission, onward)   Start     Dose/Rate Route Frequency Ordered Stop   09/13/20 1000  sulfamethoxazole-trimethoprim (BACTRIM) 200-40 MG/5ML suspension 20 mL        20 mL Per Tube Every 12 hours 09/13/20 0734 09/19/20 0926   09/13/20 0745  cefTRIAXone (ROCEPHIN) 1 g in sodium chloride 0.9 % 100 mL IVPB  Status:  Discontinued        1 g 200 mL/hr over 30 Minutes Intravenous Every 24 hours 09/13/20 0658 09/13/20 0734   08/17/20 0000  ceFAZolin (ANCEF) IVPB 2g/100 mL premix        2 g 200 mL/hr over 30 Minutes Intravenous To Radiology 08/15/20 1346 08/17/20 1111   07/22/20 0830  cefTRIAXone  (ROCEPHIN) 2 g in sodium chloride 0.9 % 100 mL IVPB        2 g 200 mL/hr over 30 Minutes Intravenous Every 24 hours 07/22/20 0720 07/26/20 0858       Time spent: 20 minutes    Erin Hearing ANP  [facility] 7 am - 330 pm/M-F for direct patient care and secure chat Please refer to Amion for contact information 61 days

## 2020-09-21 NOTE — Progress Notes (Signed)
Pt received Pfizer vaccines x2 11/2019. Per chart and fiance. Moderna vaccine walked down to Pharmacy for return.

## 2020-09-21 NOTE — Progress Notes (Signed)
  Speech Language Pathology Treatment: Dysphagia;Cognitive-Linquistic  Patient Details Name: Antonio Navarro MRN: [id number] DOB: Apr 18, 1961 Today's Date: 09/21/2020 Time: 0925-0950 SLP Time Calculation (min) (ACUTE ONLY): 25 min  Assessment / Plan / Recommendation Clinical Impression  Pt seen for skilled treatment with mod verbal/tactile cues provided for intake of D2/thin liquids with cup/straw and oral holding noted with chopped consistency and impaired bolus propulsion/transition into pharynx without mod verbal cues; liquid wash utilized to clear oral cavity with good success.  His attention affects overall intake and lack of satiety noted as well.  Environmental distractions such as TV being muted assisted with increasing attention to swallow various boluses.  No overt s/s of aspiration noted throughout session.  Pt's decreased satiety could be affected by PEG placement.        Pt with gestures primarily to communicate such as waving for "Good morning" and head/nod or shake with one incident of "uh-uh" for "no" during session.  Word repetition tasks with single words attempted with 20% success for functional words during interactions for eating/requesting TV.  Pt's yes/no responses were 80% accurate with simple questions and pt was able to follow simple directives "open your mouth" or "raise your hand" with min verbal cues and 90% accuracy, but 2-step directives were only 30% accurate with mod-max cues visually/verbally.  May consider augmentative communication to assist with expressive language if pt able to utilize given extent of anoxic brain injury.  ST will continue to f/u while in acute setting for dysphagia/communicative needs.   HPI HPI: Pt is a 60 yo male s/p arrest with downtime 9 minutes. Initial rhythm was V. fib, shocked and received 6 rounds of epi before ROSC achieved.  Total CPR time was 30 minutes. ETT 12/4-12/13. MRI 12/7 suggestive of a widespread anoxic injury. PMH includes kidney  stones and HTN.      SLP Plan  Continue with current plan of care       Recommendations  Diet recommendations: Dysphagia 2 (fine chop);Thin liquid Liquids provided via: Cup;Straw Medication Administration: Via alternative means Supervision: Staff to assist with self feeding;Full supervision/cueing for compensatory strategies Compensations: Minimize environmental distractions;Slow rate;Small sips/bites;Follow solids with liquid Postural Changes and/or Swallow Maneuvers: Seated upright 90 degrees                Oral Care Recommendations: Oral care BID Follow up Recommendations: Skilled Nursing facility SLP Visit Diagnosis: Dysphagia, oral phase (R13.11) Plan: Continue with current plan of care                      Elvina Sidle, M.S., [place] 09/21/2020, 11:43 AM

## 2020-09-21 NOTE — Progress Notes (Signed)
DTP CSW and RN CM met with patient, his fiance Loralee Pacas, and Tammy from [facility] at bedside to complete discussion. Maudie Mercury was able to get the patient's two sisters on speaker phone so they could participate in the discussion. Tammy to inquire about possible placement at either [facility] in [place] or [facility]. [name] stated preference for placement in [place] due to the distance and requested patient receive COVID booster prior to discharge - DTP NP notified of request.  Tammy to return call to DTP team with clarity on potential bed availability.   [name], MSW, LCSW-A Transitions of Care  Clinical Social Worker I [phone]

## 2020-09-21 NOTE — Progress Notes (Signed)
Nutrition Follow-up  DOCUMENTATION CODES:   Not applicable  INTERVENTION:   -ContinueMagic cup TID with meals, each supplement provides 290 kcal and 9 grams of protein -Continue feeding assistance with meals -Continue nocturnal feedings:  Jevity 1.5@ 59ml/hr via PEG over 12 hour period  38ml Prosource TF BID   Continue 200 ml free water flush every 3 hours  Tube feeding regimen provides1250kcal (54% of needs),72grams of protein, and 54ml of H2O. Total free water: 2193 ml daily  NUTRITION DIAGNOSIS:   Inadequate oral intake related to inability to eat as evidenced by NPO status.  Progressing; advanced to dysphagia 2 diet on 08/29/20  GOAL:   Patient will meet greater than or equal to 90% of their needs  Progressing   MONITOR:   Diet advancement,Labs,Weight trends,TF tolerance,Skin,I & O's  REASON FOR ASSESSMENT:   Consult,Ventilator Enteral/tube feeding initiation and management  ASSESSMENT:   Patient with PMH significant for HTN and kidney stones. Presents this admission s/p cardiac arrest.  12/13- NGT d/c 12/15- cortrak placed, tip of tube in the stomach 12/17- s/p BSE- recommend continue NPO 12/19- pt pulled cortrak tube, replaced- placement verified by x-ray (stomach) 12/20- s/p BSE- pt refusing PO trials 12/30- cortrak removed, PEG placed 1/5- Advanced to thin liquid diet 1/6- Advanced to full liquid diet 1/11- Advanced to dysphagia 2 diet 1/14- transitioned to nocturnal feedings by MD 1/18- TF d/c by MD 1/24- calorie cont completed- pt consuming about 50% of needs PO; nocturnal feedings resumed  Reviewed I/O's: -880 ml x 24 hours and -8.8 L since 09/07/20  UOP: 1 L x 24 hours  Pt remains with erratic intake. Noted meal completion 10-50%. Nocturnal feedings resumed on 09/11/20; pt tolerating well.   Per chart review, pt continues to await SNF placement. He is medically stable for discharge.   Labs reviewed: CBGS: 129-216.  Diet  Order:   Diet Order            DIET DYS 2 Room service appropriate? No; Fluid consistency: Thin  Diet effective now                 EDUCATION NEEDS:   Not appropriate for education at this time  Skin:  Skin Assessment: Skin Integrity Issues: Skin Integrity Issues:: Other (Comment) Other: MASD to buttocks, skin tear to rt upper back  Last BM:  09/21/20  Height:   Ht Readings from Last 1 Encounters:  09/03/20 6' (1.829 m)    Weight:   Wt Readings from Last 1 Encounters:  09/21/20 83.3 kg    Ideal Body Weight:  80.9 kg  BMI:  Body mass index is 24.91 kg/m.  Estimated Nutritional Needs:   Kcal:  2300-2500 kcal  Protein:  115-130 grams  Fluid:  >/= 2 L/day    Loistine Chance, RD, LDN, [place] Registered Dietitian II Certified Diabetes Care and Education Specialist Please refer to [facility] for RD and/or RD on-call/weekend/after hours pager

## 2020-09-22 LAB — GLUCOSE, CAPILLARY
Glucose-Capillary: 140 mg/dL — ABNORMAL HIGH (ref 70–99)
Glucose-Capillary: 150 mg/dL — ABNORMAL HIGH (ref 70–99)
Glucose-Capillary: 160 mg/dL — ABNORMAL HIGH (ref 70–99)
Glucose-Capillary: 202 mg/dL — ABNORMAL HIGH (ref 70–99)

## 2020-09-22 MED ORDER — LORAZEPAM 2 MG/ML IJ SOLN: 1.0000 mg | Freq: Once | INTRAMUSCULAR | Status: DC | PRN

## 2020-09-22 MED ORDER — LORAZEPAM 2 MG/ML IJ SOLN: 1.0000 mg | Freq: Once | INTRAMUSCULAR | Status: AC | PRN

## 2020-09-22 MED ORDER — LORAZEPAM 2 MG/ML IJ SOLN
INTRAMUSCULAR | Status: AC
  Administered 2020-09-22: 1 mg via INTRAMUSCULAR
  Filled 2020-09-22: qty 1

## 2020-09-22 NOTE — Progress Notes (Signed)
PT Cancellation Note  Patient Details Name: Antonio Navarro MRN: [id number] DOB: 1961/05/26   Cancelled Treatment:    Reason Eval/Treat Not Completed: Patient's level of consciousness pt given ativan early this morning due to aggressive and agitated behavior so holding PT at this time as pt unable to participate due to level of arousal. Will follow as time allows.   [place] 09/22/2020, 8:58 AM [name], PT, DPT Acute Rehabilitation Services Pager [phone] Office [phone]

## 2020-09-22 NOTE — Progress Notes (Signed)
0030 pt became agitated and aggressive. Tried getting out of bed, pulling at Peg tube, ripped off condom cath, and attempted to punch RN and NA. Provider was notified, wrist restraints ordered and applied, and Ativan IM given. Pt continues to be easily agitated but is currently resting.

## 2020-09-22 NOTE — Progress Notes (Signed)
[facility] PROGRESS NOTE  MARSHUN DUVA WCH:[id number] DOB: 06-24-1961 DOA: 07/22/2020 PCP: Patient, No Pcp Per       Status: Remains inpatient appropriate because:Altered mental status and Unsafe d/c plan   Dispo: The patient is from: Home              Anticipated d/c is to: SNF              Anticipated d/c date is: > 3 days              Patient currently is medically stable to d/c.  Barriers to discharge: Medicaid and disability pending. No SNF bed offers   Code Status: Full Family Communication: 1/06 spoke with patient's sister [name] -1/24 DVT prophylaxis: sq heparin Vaccination status: Completed both doses of [place] Covid vaccination on 12/14/2019; no option for [employer] booster at this facility therefore [place] [facility] to ordered on 09/21/2020  Foley catheter: No  HPI: 60 year old male history of hypertension prescribed Norvasc prior to admission, chronic back pain, prior renal calculi who experienced an out of hospital cardiac arrest with downtime of 9 minutes.  Initial rhythm was ventricular fibrillation.  He was defibrillated deceived 6 doses of epi before ROSC.  Subsequent work-up revealed STEMI.  He underwent cardiac catheterization with a DES placed to the circumflex.  Cardiology following and has placed patient on Brilinta and aspirin and is also being treated with beta-blocker and statins.  From a neurological standpoint he clinically has anoxic brain injury.  During this hospitalization he experienced AKI and was treated with fluids and correction of hypoperfusion from cardiac arrest.  Echo performed this admission revealed ischemic cardiomyopathy with an EF of 40%  In addition to above patient has been unable to pass swallowing evaluations and subsequently has undergone percutaneous PEG tube placement on 12/30.  Patient is awake and moving all extremities spontaneously but does not follow commands and does not verbalize palliative care has met with patient and family  and has emphasized poor prognosis and will continue to meet with the family during the hospitalization.  Current plan is to transition to SNF bed once available.  Expect need for lifelong 24/7 care for all ADLs.  Subjective: Awake.  Able to communicate through questioning that he is very frustrated over not being home.  Also apparently patient had an issue last night where he became combative but this was after he was awakened from a sound sleep.  Discussed with patient that unfortunately he needs direct supervision because of his brain injury and is not safe to live at home alone at this time.  He seemed to understand.  Objective: Vitals:   09/21/20 2022 09/22/20 0450  BP: 129/89 (!) 166/92  Pulse: 86 93  Resp: 16 18  Temp: 97.8 F (36.6 C) 97.7 F (36.5 C)  SpO2: 99% 98%    Intake/Output Summary (Last 24 hours) at 09/22/2020 0821 Last data filed at 09/22/2020 0756 Gross per 24 hour  Intake 1554 ml  Output 1674 ml  Net -120 ml   Filed Weights   09/20/20 0600 09/21/20 0402 09/22/20 0450  Weight: 87 kg 83.3 kg 83.6 kg    Exam: Constitutional: Awake, not agitated, no acute distress Cardiovascular: Normal blood pressure, normal pulse, extremities warm to touch, no peripheral edema Abdomen: PEG for tube feedings.  Bowel sounds present and abdomen nontender.  LBM 2/4 Genitourinary: Condom catheter in place with yellow urine draining to bedside bag Neurologic: CN 2-12 roughly without any appreciable focal neurological deficits,  MOE x4, strength is 4/5.   Psychiatric: Awake, oriented to name.  Calm but at times flat affect.  Able to communicate frustration over remaining in the hospital.  Understand rationale for remaining in the hospital and not being able to discharge directly home.   Assessment/Plan: Acute problems: Out of hospital cardiac arrest secondary to STEMI -ROSC and subsequent catheterization revealed disease in circumflex requiring DES -Continue beta-blocker,  statin -continue Plavix, isosorbide and hydralazine  Ischemic cardiomyopathy -Echo this admission EF 40% -Continue pharmacotherapy as above -No ACE inhibitor secondary to underlying kidney disease -Continue hydralazine and Isordil -Will need follow-up echo 3 months post initial echo  Significant anoxic brain injury secondary to cardiac arrest/deconditioning/gait instability -Consistently participating with PT and OT.  SLP has cleared for dysphagia 2 with thin liquids -Declined by CIR since family unable to provide 24/7 care.  Plan is for SNF -improved mentation after HS Seroquel decreased to 25 mg -2/4 remote EKG from December with prolonged QTC.  Repeat EKG today on 2/4 with QTC 464 ms -Continue daytime Symmetrel  -Patient with frustration and acting out behaviors when awakened during an overnight.  Have transitioned to once daily vital signs  Hypertension -Was on Norvasc prior to admission -Continue cardiovascular meds as listed above  Nonsustained VT -Continue amiodarone and beta-blocker -LFTs were repeated on 1/7 with stable AST but ALT has increased from 52 to 133 -1/19 will repeat in a.m. -Cardiology managing  Acute on chronic kidney disease stage IIIb -Suspect prerenal etiology in the setting of hypoperfusion from ventricular fibrillation arrest -Baseline creatinine between 1.6 and 2; creatinine on 12/19 was 1.67-will repeat in a.m. on 08/19/2020 -Continue free water per tube and intermittently follow labs  Dysphagia, moderate protein calorie malnutrition Nutrition Problem: Inadequate oral intake Etiology: inability to eat Signs/Symptoms: NPO status  -patient now on D2 diet with thin liquids. DC tube feedings and monitor PO intake-can resume if consistently <50% -1/24 Resumed nocturnal TFs been ongoing poor oral intake during the day Interventions: Tube feeding,Prostat Estimated body mass index is 25 kg/m as calculated from the following:   Height as of this encounter:  6' (1.829 m).   Weight as of this encounter: 83.6 kg.  Skin tear right lateral back Wound / Incision (Open or Dehisced) 07/28/20 Skin tear Back Lateral;Right;Upper (Active)  Date First Assessed/Time First Assessed: 07/28/20 0300   Wound Type: Skin tear  Location: Back  Location Orientation: Lateral;Right;Upper  Present on Admission: No    Assessments 07/28/2020  7:49 PM 09/20/2020 10:00 AM  Dressing Type None -  Wound Length (cm) - 0 cm  Wound Width (cm) - 0 cm  Wound Depth (cm) - 0 cm  Wound Volume (cm^3) - 0 cm^3  Wound Surface Area (cm^2) - 0 cm^2  Drainage Amount None -  Treatment Cleansed -     No Linked orders to display    Other problems: Acute hypernatremia -Continue free water per tube  E. coli UTI -Has completed 5 days of Bactrim  Hyperglycemia without an underlying diagnosis of diabetes mellitus Resolved -Likely related to acute stress -Recent issues with hypoglycemia so sliding scale insulin decreased to very sensitive and Levemir decreased from 30 twice daily to 15 twice daily as of 1/14 -Hemoglobin A1c 4.7  -1/18 refusing CBG checks so TF held-will stop TF and dc Levemir and SSI. If TF resumed can resume SSI and CBG checks -Serum glucose on 1/19 was 103  Acute hypoxemic respiratory failure -Resolved -Stable on room air with O2 sats between  97 and 99%   Data Reviewed: Basic Metabolic Panel: Recent Labs  Lab 09/18/20 1100  NA 136  K 4.6  CL 101  CO2 26  GLUCOSE 196*  BUN 19  CREATININE 1.48*  CALCIUM 9.5   Liver Function Tests: Recent Labs  Lab 09/18/20 1100  AST 27  ALT 63*  ALKPHOS 77  BILITOT 0.7  PROT 7.4  ALBUMIN 2.9*   No results for input(s): LIPASE, AMYLASE in the last 168 hours. No results for input(s): AMMONIA in the last 168 hours. CBC: Recent Labs  Lab 09/18/20 1100  WBC 8.0  NEUTROABS 4.7  HGB 11.9*  HCT 35.3*  MCV 98.3  PLT 259   Cardiac Enzymes: No results for input(s): CKTOTAL, CKMB, CKMBINDEX, TROPONINI in the  last 168 hours. BNP (last 3 results) No results for input(s): BNP in the last 8760 hours.  ProBNP (last 3 results) No results for input(s): PROBNP in the last 8760 hours.  CBG: Recent Labs  Lab 09/21/20 0630 09/21/20 1203 09/21/20 1548 09/22/20 0013 09/22/20 0609  GLUCAP 187* 141* 139* 160* 202*    Recent Results (from the past 240 hour(s))  Culture, blood (routine x 2)     Status: None   Collection Time: 09/14/20  1:20 PM   Specimen: BLOOD  Result Value Ref Range Status   Specimen Description BLOOD RIGHT ANTECUBITAL  Final   Special Requests   Final    BOTTLES DRAWN AEROBIC ONLY Blood Culture results may not be optimal due to an inadequate volume of blood received in culture bottles   Culture   Final    NO GROWTH 5 DAYS Performed at [facility] [address], [place], [place] [address]    Report Status 09/19/2020 FINAL  Final  Culture, blood (routine x 2)     Status: None   Collection Time: 09/14/20  1:23 PM   Specimen: BLOOD  Result Value Ref Range Status   Specimen Description BLOOD LEFT ANTECUBITAL  Final   Special Requests   Final    BOTTLES DRAWN AEROBIC ONLY Blood Culture adequate volume   Culture   Final    NO GROWTH 5 DAYS Performed at [facility], [place] [address], [place],  [address]    Report Status 09/19/2020 FINAL  Final     Studies: No results found.  Scheduled Meds: . amantadine  100 mg Per Tube Daily  . aspirin  81 mg Per Tube Daily  . atorvastatin  80 mg Per Tube Daily  . bethanechol  10 mg Per Tube TID  . carvedilol  25 mg Per Tube BID WC  . chlorhexidine gluconate (MEDLINE KIT)  15 mL Mouth Rinse BID  . clopidogrel  75 mg Per Tube Daily  . COVID-19 mRNA vaccine (Moderna)  0.25 mL Intramuscular Once  . feeding supplement (PROSource TF)  45 mL Per Tube BID  . free water  200 mL Per Tube Q2H  . heparin  5,000 Units Subcutaneous Q8H  . hydrALAZINE  37.5 mg Per Tube Q8H  . insulin aspart  0-15 Units Subcutaneous Q6H  .  isosorbide dinitrate  20 mg Per Tube TID  . mouth rinse  15 mL Mouth Rinse BID  . polyvinyl alcohol  1 drop Both Eyes QID  . QUEtiapine  25 mg Per Tube QHS  . sodium chloride flush  3 mL Intravenous Q12H   Continuous Infusions: . feeding supplement (JEVITY 1.5 CAL/FIBER) 780 mL (09/20/20 2300)    Active Problems:  Acute ST elevation myocardial infarction (STEMI) [facility])   Cardiac arrest (HCC)   Elevated blood pressure reading with diagnosis of hypertension   Shock circulatory (HCC)   Encephalopathy   History of ETT   Cardiomyopathy, ischemic   Anoxic brain damage ([place])   Dysphagia   DNR (do not resuscitate) discussion   Gait instability   E. coli UTI   Consultants:  Cardiology  Interventional radiology  PCCM  Neurology  Procedures:  Cardiac catheterization 12/4  Continuous EEG 12/4 and overnight EEG with video 12/5  Echocardiogram 12/5  EEG 12/9  Percutaneous PEG tube placement 12/30  Antibiotics: Anti-infectives (From admission, onward)   Start     Dose/Rate Route Frequency Ordered Stop   09/13/20 1000  sulfamethoxazole-trimethoprim (BACTRIM) 200-40 MG/5ML suspension 20 mL        20 mL Per Tube Every 12 hours 09/13/20 0734 09/19/20 0926   09/13/20 0745  cefTRIAXone (ROCEPHIN) 1 g in sodium chloride 0.9 % 100 mL IVPB  Status:  Discontinued        1 g 200 mL/hr over 30 Minutes Intravenous Every 24 hours 09/13/20 0658 09/13/20 0734   08/17/20 0000  ceFAZolin (ANCEF) IVPB 2g/100 mL premix        2 g 200 mL/hr over 30 Minutes Intravenous To Radiology 08/15/20 1346 08/17/20 1111   07/22/20 0830  cefTRIAXone (ROCEPHIN) 2 g in sodium chloride 0.9 % 100 mL IVPB        2 g 200 mL/hr over 30 Minutes Intravenous Every 24 hours 07/22/20 0720 07/26/20 0858       Time spent: 20 minutes    Erin Hearing ANP  [facility] 7 am - 330 pm/M-F for direct patient care and secure chat Please refer to Amion for contact information 62 days

## 2020-09-22 NOTE — TOC Progression Note (Signed)
Transition of Care [facility]) - Progression Note    Patient Details  Name: Antonio Navarro MRN: [id number] Date of Birth: 1960-10-20  Transition [facility] CM/SW Contact  Curlene Labrum, RN Phone Number: 09/22/2020, 3:32 PM  Clinical Narrative:    Case management talked with Antonio Navarro, CM and they are unable to accept the patient for admission due to his recent behaviors.  CM will continue to follow the patient for [facility] placement.   Expected Discharge Plan: Skilled Nursing Facility Barriers to Discharge: Continued Medical Work up,No SNF bed  Expected Discharge Plan and Services Expected Discharge Plan: [place] arrangements for the past 2 months: Single Family Home                                       Social Determinants of Health (SDOH) Interventions    Readmission Risk Interventions No flowsheet data found.

## 2020-09-22 NOTE — Progress Notes (Signed)
OT Cancellation Note  Patient Details Name: Antonio Navarro MRN: [id number] DOB: 28-Sep-1960   Cancelled Treatment:    Reason Eval/Treat Not Completed: Medical issues which prohibited therapy. Session limited due to pt level of arousal per RN and given ativan early this AM. RN advised to hold session at this time. OT will continue to follow as time allows.  Minus Breeding, MSOT, OTR/L  [facility]  [phone]   Marius Ditch 09/22/2020, 12:11 PM

## 2020-09-23 LAB — GLUCOSE, CAPILLARY
Glucose-Capillary: 164 mg/dL — ABNORMAL HIGH (ref 70–99)
Glucose-Capillary: 173 mg/dL — ABNORMAL HIGH (ref 70–99)
Glucose-Capillary: 174 mg/dL — ABNORMAL HIGH (ref 70–99)
Glucose-Capillary: 197 mg/dL — ABNORMAL HIGH (ref 70–99)

## 2020-09-23 MED ORDER — ENOXAPARIN SODIUM 40 MG/0.4ML ~~LOC~~ SOLN: 40.0000 mg | SUBCUTANEOUS | Status: DC

## 2020-09-23 MED ORDER — ENOXAPARIN SODIUM 40 MG/0.4ML ~~LOC~~ SOLN
40.0000 mg | SUBCUTANEOUS | Status: DC
  Administered 2020-09-23 – 2020-11-23 (×61): 40 mg via SUBCUTANEOUS
  Filled 2020-09-23 (×62): qty 0.4

## 2020-09-23 NOTE — Progress Notes (Signed)
Patient seen and examined.  Last night was uneventful.  The night before he was disturbed to take vitals in the middle of the night and got agitated needed Ativan. Patient himself is poor historian.  He tells me that he is feeling okay.  Denies any complaints.  ST elevation MI/V. fib arrest Ischemic cardiomyopathy Anoxic brain injury Hypertension Dysphagia  Patient is medically stabilized.  Continue all medical care.  Continue tube feeding and encourage oral intake.  Seroquel dose adjusted. Discharge to skilled nursing facility whenever bed is available. Patient does not need frequent vitals and labs.  Change to once daily vitals.  Total time spent: 15 minutes.

## 2020-09-24 LAB — GLUCOSE, CAPILLARY
Glucose-Capillary: 197 mg/dL — ABNORMAL HIGH (ref 70–99)
Glucose-Capillary: 140 mg/dL — ABNORMAL HIGH (ref 70–99)
Glucose-Capillary: 145 mg/dL — ABNORMAL HIGH (ref 70–99)

## 2020-09-24 NOTE — Plan of Care (Signed)
  Problem: Health Behavior/Discharge Planning: Goal: Ability to manage health-related needs will improve Outcome: Progressing   Problem: Clinical Measurements: Goal: Ability to maintain clinical measurements within normal limits will improve Outcome: Progressing Goal: Will remain free from infection Outcome: Progressing Goal: Diagnostic test results will improve Outcome: Progressing Goal: Respiratory complications will improve Outcome: Progressing Goal: Cardiovascular complication will be avoided Outcome: Progressing   Problem: Activity: Goal: Risk for activity intolerance will decrease Outcome: Progressing   Problem: Nutrition: Goal: Adequate nutrition will be maintained Outcome: Progressing   Problem: Coping: Goal: Level of anxiety will decrease Outcome: Progressing   Problem: Elimination: Goal: Will not experience complications related to bowel motility Outcome: Progressing Goal: Will not experience complications related to urinary retention Outcome: Progressing   Problem: Safety: Goal: Ability to remain free from injury will improve Outcome: Progressing   Problem: Skin Integrity: Goal: Risk for impaired skin integrity will decrease Outcome: Progressing   Problem: Education: Goal: Ability to manage disease process will improve Outcome: Progressing   Problem: Cardiac: Goal: Ability to achieve and maintain adequate cardiopulmonary perfusion will improve Outcome: Progressing   Problem: Neurologic: Goal: Promote progressive neurologic recovery Outcome: Progressing   Problem: Skin Integrity: Goal: Risk for impaired skin integrity will be minimized. Outcome: Progressing   Problem: Education: Goal: Ability to demonstrate management of disease process will improve Outcome: Progressing Goal: Ability to verbalize understanding of medication therapies will improve Outcome: Progressing   Problem: Activity: Goal: Capacity to carry out activities will  improve Outcome: Progressing   Problem: Cardiac: Goal: Ability to achieve and maintain adequate cardiopulmonary perfusion will improve Outcome: Progressing   Problem: Safety: Goal: Non-violent Restraint(s) Outcome: Progressing

## 2020-09-24 NOTE — Progress Notes (Signed)
Patient seen and examined.  He is not able to be discharged because of unsafe disposition. Nursing were helping him with tube feeding.  He denied any overnight events. Today he was more alert and able to have some conversation.  ST elevation MI/V. fib arrest Ischemic cardiomyopathy Anoxic brain injury Hypertension Dysphagia  Patient is medically stabilized.  Continue all medical care.  Continue tube feeding and encourage oral intake.  Seroquel dose adjusted. Discharge to skilled nursing facility whenever bed is available. Minimize nighttime intervention. Unsafe disposition.  Total time spent: 15 minutes.

## 2020-09-25 LAB — GLUCOSE, CAPILLARY
Glucose-Capillary: 189 mg/dL — ABNORMAL HIGH (ref 70–99)
Glucose-Capillary: 173 mg/dL — ABNORMAL HIGH (ref 70–99)
Glucose-Capillary: 142 mg/dL — ABNORMAL HIGH (ref 70–99)
Glucose-Capillary: 129 mg/dL — ABNORMAL HIGH (ref 70–99)
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
## 2020-09-25 NOTE — Progress Notes (Addendum)
[facility] PROGRESS NOTE  Antonio Navarro KPT:[id number] DOB: 30-Jan-1961 DOA: 07/22/2020 PCP: Patient, No Pcp Per       Status: Remains inpatient appropriate because:Altered mental status and Unsafe d/c plan   Dispo: The patient is from: Home              Anticipated d/c is to: SNF              Anticipated d/c date is: > 3 days              Patient currently is medically stable to d/c.  Barriers to discharge: Medicaid and disability pending. No SNF bed offers   Code Status: Full Family Communication: 2/7 HIPPA appropriate voicemail left for patient's sister Antonio Navarro explaining loss of SNF bed and the circumstances surrounding that.  Also updated her that his clinicals have been submitted to another facility in [place]. DVT prophylaxis: sq heparin Vaccination status: Completed both doses of Pfizer Covid vaccination on 12/14/2019; no option for Pfizer booster at this facility therefore [place] [facility] to ordered on 09/21/2020  Foley catheter: No  HPI: 60 year old male history of hypertension prescribed Norvasc prior to admission, chronic back pain, prior renal calculi who experienced an out of hospital cardiac arrest with downtime of 9 minutes.  Initial rhythm was ventricular fibrillation.  He was defibrillated deceived 6 doses of epi before ROSC.  Subsequent work-up revealed STEMI.  He underwent cardiac catheterization with a DES placed to the circumflex.  Cardiology following and has placed patient on Brilinta and aspirin and is also being treated with beta-blocker and statins.  From a neurological standpoint he clinically has anoxic brain injury.  During this hospitalization he experienced AKI and was treated with fluids and correction of hypoperfusion from cardiac arrest.  Echo performed this admission revealed ischemic cardiomyopathy with an EF of 40%  In addition to above patient has been unable to pass swallowing evaluations and subsequently has undergone percutaneous PEG tube  placement on 12/30.  Patient is awake and moving all extremities spontaneously but does not follow commands and does not verbalize palliative care has met with patient and family and has emphasized poor prognosis and will continue to meet with the family during the hospitalization.  Current plan is to transition to SNF bed once available.  Expect need for lifelong 24/7 care for all ADLs.  Subjective: Awake and alert.  Smiling at times.  Discussed issue where he had one-time episode of striking out at nursing staff when he was awaken during the middle of sleeping for vital signs.  Informed patient that there is one issue kept him from being placed at the facility for further rehabilitative treatments.  He nodded understanding.  We reinforced to patient that we know that this is not his typical behavior and he was awakened in the middle of the night and it is understandable in that circumstance with sleep medications on board that he would respond in that manner.  Objective: Vitals:   09/25/20 0500 09/25/20 0540  BP: (!) 151/88 (!) 151/88  Pulse:  85  Resp:  17  Temp:  98 F (36.7 C)  SpO2:  100%    Intake/Output Summary (Last 24 hours) at 09/25/2020 0753 Last data filed at 09/25/2020 0503 Gross per 24 hour  Intake 2983 ml  Output 2802 ml  Net 181 ml   Filed Weights   09/22/20 0450 09/23/20 0530 09/25/20 0548  Weight: 83.6 kg 85.8 kg 86.1 kg    Exam: Constitutional: Awake alert  and in no acute distress Cardiovascular: Sounds S1-S2, pulse regular, extremities warm to touch Abdomen: PEG for nocturnal tube feeding.  Improved oral intake, bowel sounds.  LBM 2/6 Neurologic: CN 2-12 roughly without any appreciable focal neurological deficits, MOE x4, strength is 4/5.   Psychiatric:   Assessment/Plan: Acute problems: Out of hospital cardiac arrest secondary to STEMI -ROSC and subsequent catheterization revealed disease in circumflex requiring DES -Continue beta-blocker, statin, Plavix,  isosorbide and hydralazine  Ischemic cardiomyopathy -Echo this admission EF 40% -Continue pharmacotherapy as above -No ACE inhibitor secondary to underlying kidney disease -Continue hydralazine and Isordil -Will need follow-up echo 3 months post initial echo  Significant anoxic brain injury secondary to cardiac arrest/deconditioning/gait instability -Consistently participating with PT and OT.  SLP has cleared for dysphagia 2 with thin liquids -Declined by CIR since family unable to provide 24/7 care.  Plan is for SNF -improved mentation after HS Seroquel decreased to 25 mg -2/4 remote EKG from December with prolonged QTC.  Repeat EKG today on 2/4 with QTC 464 ms -Continue daytime Symmetrel  -Patient with frustration and acting out behaviors when awakened during an overnight.  Have transitioned to once daily vital signs  Hypertension -Was on Norvasc prior to admission -Continue cardiovascular meds as listed above  Nonsustained VT -Continue amiodarone and beta-blocker -LFTs were repeated on 1/7 with stable AST but ALT has increased from 52 to 133 -1/19 will repeat in a.m. -Cardiology managing  Acute on chronic kidney disease stage IIIb -Suspect prerenal etiology in the setting of hypoperfusion from ventricular fibrillation arrest -Baseline creatinine between 1.6 and 2; creatinine on 12/19 was 1.67-will repeat in a.m. on 08/19/2020 -Continue free water per tube and intermittently follow labs  Dysphagia, moderate protein calorie malnutrition Nutrition Problem: Inadequate oral intake Etiology: inability to eat Signs/Symptoms: NPO status  -Currently on nocturnal tube feedings.  Since 2/4 patient has eaten between 60 and 95% of meals.  If this trend continues likely can transition to oral diet only. -1/24 Resumed nocturnal TFs been ongoing poor oral intake during the day -Currently on D2 diet with thin liquids Interventions: Tube feeding,Prostat Estimated body mass index is 25.74 kg/m  as calculated from the following:   Height as of this encounter: 6' (1.829 m).   Weight as of this encounter: 86.1 kg.  Skin tear right lateral back Wound / Incision (Open or Dehisced) 07/28/20 Skin tear Back Lateral;Right;Upper (Active)  Date First Assessed/Time First Assessed: 07/28/20 0300   Wound Type: Skin tear  Location: Back  Location Orientation: Lateral;Right;Upper  Present on Admission: No    Assessments 07/28/2020  7:49 PM 09/23/2020  9:05 AM  Dressing Type None Foam - Lift dressing to assess site every shift  Dressing Change Frequency -- PRN  Drainage Amount None --  Treatment Cleansed --     No Linked orders to display    Other problems: Acute hypernatremia -Continue free water per tube  E. coli UTI -Has completed 5 days of Bactrim  Hyperglycemia without an underlying diagnosis of diabetes mellitus Resolved -Likely related to acute stress -Recent issues with hypoglycemia so sliding scale insulin decreased to very sensitive and Levemir decreased from 30 twice daily to 15 twice daily as of 1/14 -Hemoglobin A1c 4.7  -1/18 refusing CBG checks so TF held-will stop TF and dc Levemir and SSI. If TF resumed can resume SSI and CBG checks -Serum glucose on 1/19 was 103  Acute hypoxemic respiratory failure -Resolved -Stable on room air with O2 sats between 97 and 99%  Data Reviewed: Basic Metabolic Panel: Recent Labs  Lab 09/18/20 1100  NA 136  K 4.6  CL 101  CO2 26  GLUCOSE 196*  BUN 19  CREATININE 1.48*  CALCIUM 9.5   Liver Function Tests: Recent Labs  Lab 09/18/20 1100  AST 27  ALT 63*  ALKPHOS 77  BILITOT 0.7  PROT 7.4  ALBUMIN 2.9*   No results for input(s): LIPASE, AMYLASE in the last 168 hours. No results for input(s): AMMONIA in the last 168 hours. CBC: Recent Labs  Lab 09/18/20 1100  WBC 8.0  NEUTROABS 4.7  HGB 11.9*  HCT 35.3*  MCV 98.3  PLT 259   Cardiac Enzymes: No results for input(s): CKTOTAL, CKMB, CKMBINDEX, TROPONINI in the  last 168 hours. BNP (last 3 results) No results for input(s): BNP in the last 8760 hours.  ProBNP (last 3 results) No results for input(s): PROBNP in the last 8760 hours.  CBG: Recent Labs  Lab 09/23/20 1609 09/24/20 0503 09/24/20 1148 09/24/20 1553 09/25/20 0456  GLUCAP 164* 197* 140* 145* 189*    No results found for this or any previous visit (from the past 240 hour(s)).   Studies: No results found.  Scheduled Meds: . amantadine  100 mg Per Tube Daily  . aspirin  81 mg Per Tube Daily  . atorvastatin  80 mg Per Tube Daily  . bethanechol  10 mg Per Tube TID  . carvedilol  25 mg Per Tube BID WC  . chlorhexidine gluconate (MEDLINE KIT)  15 mL Mouth Rinse BID  . clopidogrel  75 mg Per Tube Daily  . COVID-19 mRNA vaccine (Moderna)  0.25 mL Intramuscular Once  . enoxaparin (LOVENOX) injection  40 mg Subcutaneous Q24H  . feeding supplement (PROSource TF)  45 mL Per Tube BID  . free water  200 mL Per Tube Q2H  . hydrALAZINE  37.5 mg Per Tube Q8H  . insulin aspart  0-15 Units Subcutaneous Q6H  . isosorbide dinitrate  20 mg Per Tube TID  . mouth rinse  15 mL Mouth Rinse BID  . polyvinyl alcohol  1 drop Both Eyes QID  . QUEtiapine  25 mg Per Tube QHS  . sodium chloride flush  3 mL Intravenous Q12H   Continuous Infusions: . feeding supplement (JEVITY 1.5 CAL/FIBER) 780 mL (09/24/20 2100)    Active Problems:   Acute ST elevation myocardial infarction (STEMI) [facility])   Cardiac arrest (HCC)   Elevated blood pressure reading with diagnosis of hypertension   Shock circulatory (HCC)   Encephalopathy   History of ETT   Cardiomyopathy, ischemic   Anoxic brain damage ([place])   Dysphagia   DNR (do not resuscitate) discussion   Gait instability   E. coli UTI   Consultants:  Cardiology  Interventional radiology  PCCM  Neurology  Procedures:  Cardiac catheterization 12/4  Continuous EEG 12/4 and overnight EEG with video 12/5  Echocardiogram 12/5  EEG  12/9  Percutaneous PEG tube placement 12/30  Antibiotics: Anti-infectives (From admission, onward)   Start     Dose/Rate Route Frequency Ordered Stop   09/13/20 1000  sulfamethoxazole-trimethoprim (BACTRIM) 200-40 MG/5ML suspension 20 mL        20 mL Per Tube Every 12 hours 09/13/20 0734 09/19/20 0926   09/13/20 0745  cefTRIAXone (ROCEPHIN) 1 g in sodium chloride 0.9 % 100 mL IVPB  Status:  Discontinued        1 g 200 mL/hr over 30 Minutes Intravenous Every 24 hours 09/13/20 0658 09/13/20  2233   08/17/20 0000  ceFAZolin (ANCEF) IVPB 2g/100 mL premix        2 g 200 mL/hr over 30 Minutes Intravenous To Radiology 08/15/20 1346 08/17/20 1111   07/22/20 0830  cefTRIAXone (ROCEPHIN) 2 g in sodium chloride 0.9 % 100 mL IVPB        2 g 200 mL/hr over 30 Minutes Intravenous Every 24 hours 07/22/20 0720 07/26/20 0858       Time spent: 20 minutes    Erin Hearing ANP  [facility] 7 am - 330 pm/M-F for direct patient care and secure chat Please refer to Amion for contact information 65 days

## 2020-09-25 NOTE — Progress Notes (Signed)
Physical Therapy Treatment Patient Details Name: Antonio Navarro MRN: [id number] DOB: [birth year] Today's Date: 09/25/2020    History of Present Illness [name] with history of tobacco abuse, obesity, HTN who was admitted 07/22/20 post cardiac arrest with inferolateral STEMI. He was resuscitated in the field by EMS after being found to be in V fib. Approximately 30 minutes of CPR. He was intubated on arrival.  Pt with anoxic brain injury. PEG placed on 08/17/20    PT Comments    Pt progressing towards his physical therapy goals. Able to initiate bed mobility today with cueing and repetition. Requiring min-mod assist (+2 for safety) for functional mobility. Ambulating 100 ft x 2 with a walker and chair follow. Pt not able to self regulate or verbalize when fatigued. Continue to recommend SNF for ongoing Physical Therapy.      Follow Up Recommendations  SNF     Equipment Recommendations  None recommended by PT    Recommendations for Other Services       Precautions / Restrictions Precautions Precautions: Fall;Other (comment) Precaution Comments: mitts, gtube Restrictions Weight Bearing Restrictions: No    Mobility  Bed Mobility Overal bed mobility: Needs Assistance Bed Mobility: Supine to Sit;Sit to Supine     Supine to sit: Min assist Sit to supine: Min assist   General bed mobility comments: Pt initiating BLE's to edge of bed, assist for trunk to upright. Assist for LLE back into bed  Transfers Overall transfer level: Needs assistance Equipment used: Rolling walker (2 wheeled) Transfers: Sit to/from Stand Sit to Stand: Min assist;+2 safety/equipment         General transfer comment: MinA to rise from edge of bed and recliner, cues for hand placement  Ambulation/Gait Ambulation/Gait assistance: Mod assist;+2 safety/equipment Gait Distance (Feet): 200 Feet (100", 100") Assistive device: Rolling walker (2 wheeled) Gait Pattern/deviations: Narrow base of  support;Decreased stride length;Step-through pattern Gait velocity: decreased   General Gait Details: Cues for forward gaze (pt distracted by environmental factors), up to modA for stability, close chair follow. Pt requiring one seated rest break   Stairs             Wheelchair Mobility    Modified Rankin (Stroke Patients Only)       Balance Overall balance assessment: Needs assistance Sitting-balance support: Feet supported Sitting balance-Leahy Scale: Fair     Standing balance support: Bilateral upper extremity supported Standing balance-Leahy Scale: Poor Standing balance comment: requires BUE support during mobility                            Cognition Arousal/Alertness: Awake/alert Behavior During Therapy: Flat affect Overall Cognitive Status: Impaired/Different from baseline Area of Impairment: Attention;Following commands;Awareness                   Current Attention Level: Selective Memory: Decreased short-term memory;Decreased recall of precautions Following Commands: Follows one step commands inconsistently;Follows one step commands with increased time Safety/Judgement: Decreased awareness of deficits;Decreased awareness of safety Awareness: Intellectual Problem Solving: Slow processing;Difficulty sequencing;Decreased initiation;Requires verbal cues;Requires tactile cues General Comments: Decreased verbalizations today. Cues provided for attending to task      Exercises      General Comments        Pertinent Vitals/Pain Pain Assessment: Faces Faces Pain Scale: No hurt    Home Living  Prior Function            PT Goals (current goals can now be found in the care plan section) Acute Rehab PT Goals Patient Stated Goal: none stated PT Goal Formulation: With patient Time For Goal Achievement: 10/09/20 Potential to Achieve Goals: Fair Progress towards PT goals: Progressing toward goals     Frequency    Min 3X/week      PT Plan Current plan remains appropriate    Co-evaluation              AM-PAC PT "6 Clicks" Mobility   Outcome Measure  Help needed turning from your back to your side while in a flat bed without using bedrails?: A Little Help needed moving from lying on your back to sitting on the side of a flat bed without using bedrails?: A Little Help needed moving to and from a bed to a chair (including a wheelchair)?: A Little Help needed standing up from a chair using your arms (e.g., wheelchair or bedside chair)?: A Little Help needed to walk in hospital room?: A Lot Help needed climbing 3-5 steps with a railing? : A Lot 6 Click Score: 16    End of Session Equipment Utilized During Treatment: Gait belt Activity Tolerance: Patient tolerated treatment well Patient left: in bed;with call bell/phone within reach;with bed alarm set Nurse Communication: Mobility status PT Visit Diagnosis: Other abnormalities of gait and mobility (R26.89)     Time: 0300-9233 PT Time Calculation (min) (ACUTE ONLY): 17 min  Charges:  $Gait Training: 8-22 mins                     Wyona Almas, PT, DPT Acute Rehabilitation Services Pager [phone] Office [phone]    Deno Etienne 09/25/2020, 3:11 PM

## 2020-09-26 LAB — GLUCOSE, CAPILLARY
Glucose-Capillary: 114 mg/dL — ABNORMAL HIGH (ref 70–99)
Glucose-Capillary: 141 mg/dL — ABNORMAL HIGH (ref 70–99)
Glucose-Capillary: 161 mg/dL — ABNORMAL HIGH (ref 70–99)
Glucose-Capillary: 163 mg/dL — ABNORMAL HIGH (ref 70–99)

## 2020-09-26 NOTE — Progress Notes (Signed)
Occupational Therapy Treatment Patient Details Name: Antonio Navarro MRN: [id number] DOB: 07/10/1961 Today's Date: 09/26/2020    History of present illness [name] with history of tobacco abuse, obesity, HTN who was admitted 07/22/20 post cardiac arrest with inferolateral STEMI. He was resuscitated in the field by EMS after being found to be in V fib. Approximately 30 minutes of CPR. He was intubated on arrival.  Pt with anoxic brain injury. PEG placed on 08/17/20   OT comments  Pt making steady progress towards OT goals this session. Pt more lethargic this session needing max multimodal cues to arouse and needed total A +2 to transition to EOB. Pt with decreased ability to follow commands this session making no efforts to verbalize, pt returned self to supine after attempting to sit<>stand x3 from EOB with RW despite MAX A+2. Pt would continue to benefit from skilled occupational therapy while admitted and after d/c to address the below listed limitations in order to improve overall functional mobility and facilitate independence with BADL participation. DC plan remains appropriate, will follow acutely per POC.     Follow Up Recommendations  SNF;Supervision/Assistance - 24 hour    Equipment Recommendations  Wheelchair (measurements OT);Wheelchair cushion (measurements OT);Hospital bed;3 in 1 bedside commode    Recommendations for Other Services      Precautions / Restrictions Precautions Precautions: Fall;Other (comment) Precaution Comments: mitts, gtube Restrictions Weight Bearing Restrictions: No       Mobility Bed Mobility Overal bed mobility: Needs Assistance Bed Mobility: Supine to Sit;Sit to Supine     Supine to sit: Total assist;+2 for physical assistance;HOB elevated Sit to supine: Min guard;HOB elevated   General bed mobility comments: pt requried total A +2 to transition to EOB, pt returned self back to supine with min guard for line mgmt  Transfers                  General transfer comment: attempted sit<>stand x3 from EOB from elevated EOB with pt unable to rise. pt not assisting COTA or tech with powering up, returned self to supine    Balance Overall balance assessment: Needs assistance Sitting-balance support: Feet supported Sitting balance-Leahy Scale: Fair Sitting balance - Comments: pt able to st EOB with no UE support and no LOB                                   ADL either performed or assessed with clinical judgement   ADL Overall ADL's : Needs assistance/impaired     Grooming: Wash/dry face;Bed level;Total assistance Grooming Details (indicate cue type and reason): total A to wash face secondary to decreased level of arousal             Lower Body Dressing: Total assistance;Bed level Lower Body Dressing Details (indicate cue type and reason): to don socks             Functional mobility during ADLs: Total assistance;+2 for physical assistance (bed mobility only) General ADL Comments: pt limited by lethargy this session, not following commands and presenting with self limiting behaviors     Vision       Perception     Praxis      Cognition Arousal/Alertness: Lethargic;Awake/alert (initially lethargic at start of session but woke up more as session progressed, once back in bed pt immediately returned to sleeping) Behavior During Therapy: Flat affect Overall Cognitive Status: Impaired/Different from baseline Area  of Impairment: Attention;Following commands;Awareness;Safety/judgement;Problem solving                   Current Attention Level: Selective   Following Commands: Follows one step commands inconsistently Safety/Judgement: Decreased awareness of deficits;Decreased awareness of safety Awareness: Intellectual Problem Solving: Slow processing;Difficulty sequencing;Decreased initiation;Requires verbal cues;Requires tactile cues General Comments: pt with no verbalizations this  session, not following commands returning self to supine        Exercises     Shoulder Instructions       General Comments      Pertinent Vitals/ Pain       Pain Assessment: Faces Faces Pain Scale: No hurt  Home Living                                          Prior Functioning/Environment              Frequency  Min 2X/week        Progress Toward Goals  OT Goals(current goals can now be found in the care plan section)  Progress towards OT goals: Progressing toward goals  Acute Rehab OT Goals Patient Stated Goal: none stated Time For Goal Achievement: 09/27/20 Potential to Achieve Goals: [place] Discharge plan remains appropriate;Frequency remains appropriate    Co-evaluation                 AM-PAC OT "6 Clicks" Daily Activity     Outcome Measure   Help from another person eating meals?: A Little Help from another person taking care of personal grooming?: A Little Help from another person toileting, which includes using toliet, bedpan, or urinal?: A Lot Help from another person bathing (including washing, rinsing, drying)?: A Lot   Help from another person to put on and taking off regular lower body clothing?: A Lot 6 Click Score: 12    End of Session Equipment Utilized During Treatment: Gait belt;Rolling walker  OT Visit Diagnosis: Muscle weakness (generalized) (M62.81);Pain;Other abnormalities of gait and mobility (R26.89);Cognitive communication deficit (R41.841);Low vision, both eyes (H54.2) Symptoms and signs involving cognitive functions: Other Nontraumatic ICH   Activity Tolerance Other (comment) (self limiting)   Patient Left in bed;with call bell/phone within reach;with bed alarm set   Nurse Communication Mobility status        Time: 1030-1044 OT Time Calculation (min): 14 min  Charges: OT General Charges $OT Visit: 1 Visit OT Treatments $Self Care/Home Management : 8-22 mins  [name].,  COTA/L Acute Rehabilitation Services [place]    Precious Haws 09/26/2020, 2:15 PM

## 2020-09-26 NOTE — Progress Notes (Signed)
  Speech Language Pathology Treatment: Dysphagia;Cognitive-Linquistic  Patient Details Name: Antonio Navarro MRN: [id number] DOB: 1961-01-02 Today's Date: 09/26/2020 Time: 9518-8416 SLP Time Calculation (min) (ACUTE ONLY): 19 min  Assessment / Plan / Recommendation Clinical Impression  Pt consumed thin liquids and solids with no overt s/s of aspiration during dysphagia tx with prolonged mastication which appeared to still be functional. Pt was also seen for cog/comm tx. Overall, pt spoke with more frequency and at the phrase level compared to previous sessions in which he spoke at the word level. Pt followed commands with Mod multimodal cues throughout today's session such as pushing himself up in bed and opening his mouth. SLP also targeted divergent naming in which he completed the tasks with accuracy x2 with phonemic and contextual cueing. Pt responded to yes/no questions with Mod multimodal cues. He stated his first name and his sisters' name independently but benefited from a phonemic cue for his last name. Recommend continuation of DYS 2 diet and thin liquids with potential for diet upgrade with persistent improvements in oral preparation and sustained attention. SLP will continue to f/u for dysphagia and cog/comm tx.    HPI HPI: Pt is a 60 yo male s/p arrest with downtime 9 minutes. Initial rhythm was V. fib, shocked and received 6 rounds of epi before ROSC achieved.  Total CPR time was 30 minutes. ETT 12/4-12/13. MRI 12/7 suggestive of a widespread anoxic injury. PMH includes kidney stones and HTN.      SLP Plan  Continue with current plan of care       Recommendations  Diet recommendations: Dysphagia 2 (fine chop);Thin liquid Liquids provided via: Cup;Straw Medication Administration: Crushed with puree Supervision: Staff to assist with self feeding;Full supervision/cueing for compensatory strategies Compensations: Slow rate;Small sips/bites;Minimize environmental distractions Postural  Changes and/or Swallow Maneuvers: Seated upright 90 degrees                Oral Care Recommendations: Oral care BID Follow up Recommendations: Skilled Nursing facility SLP Visit Diagnosis: Dysphagia, oral phase (R13.11) Plan: Continue with current plan of care       GO                Jeanine Luz., SLP Student 09/26/2020, 4:50 PM

## 2020-09-26 NOTE — Plan of Care (Signed)
  Problem: Health Behavior/Discharge Planning: Goal: Ability to manage health-related needs will improve Outcome: Progressing   

## 2020-09-26 NOTE — Progress Notes (Signed)
[facility] PROGRESS NOTE  Antonio Navarro CBS:[id number] DOB: [birth year] DOA: 07/22/2020 PCP: Patient, No Pcp Per       Status: Remains inpatient appropriate because:Altered mental status and Unsafe d/c plan   Dispo: The patient is from: Home              Anticipated d/c is to: SNF              Anticipated d/c date is: > 3 days              Patient currently is medically stable to d/c.  Barriers to discharge: Medicaid and disability pending. No SNF bed offers   Code Status: Full Family Communication: 2/7 HIPPA appropriate voicemail left for patient's sister Antonio Navarro explaining loss of SNF bed and the circumstances surrounding that.  Also updated her that his clinicals have been submitted to another facility in [place]. DVT prophylaxis: sq heparin Vaccination status: Completed both doses of Pfizer Covid vaccination on 12/14/2019; no option for Pfizer booster at this facility therefore [facility] to ordered on 09/21/2020  Foley catheter: No  HPI: 60 year old male history of hypertension prescribed Norvasc prior to admission, chronic back pain, prior renal calculi who experienced an out of hospital cardiac arrest with downtime of 9 minutes.  Initial rhythm was ventricular fibrillation.  He was defibrillated deceived 6 doses of epi before ROSC.  Subsequent work-up revealed STEMI.  He underwent cardiac catheterization with a DES placed to the circumflex.  Cardiology following and has placed patient on Brilinta and aspirin and is also being treated with beta-blocker and statins.  From a neurological standpoint he clinically has anoxic brain injury.  During this hospitalization he experienced AKI and was treated with fluids and correction of hypoperfusion from cardiac arrest.  Echo performed this admission revealed ischemic cardiomyopathy with an EF of 40%  In addition to above patient has been unable to pass swallowing evaluations and subsequently has undergone percutaneous PEG tube  placement on 12/30.  Patient is awake and moving all extremities spontaneously but does not follow commands and does not verbalize palliative care has met with patient and family and has emphasized poor prognosis and will continue to meet with the family during the hospitalization.  Current plan is to transition to SNF bed once available.  Expect need for lifelong 24/7 care for all ADLs.  Subjective: Sleeping soundly and did not awaken during exam.  Objective: Vitals:   09/25/20 2045 09/26/20 0537  BP: 127/87 125/75  Pulse: 89 90  Resp: 16 17  Temp: 98.3 F (36.8 C) 98.2 F (36.8 C)  SpO2: 95% 99%    Intake/Output Summary (Last 24 hours) at 09/26/2020 0748 Last data filed at 09/26/2020 0502 Gross per 24 hour  Intake 2133 ml  Output 1375 ml  Net 758 ml   Filed Weights   09/22/20 0450 09/23/20 0530 09/25/20 0548  Weight: 83.6 kg 85.8 kg 86.1 kg    Exam: Constitutional: Sleeping, no acute distress Cardiovascular: Heart sounds are normal, pulses regular, extremities warm to touch Abdomen: Abdomen soft, nondistended with normoactive bowel sounds PEG for nocturnal tube feedings.  LBM 2/7 Neurologic: CN 2-12 roughly without any appreciable focal neurological deficits, MOE x4, strength is 4/5.   Psychiatric: Sleeping soundly.  At baseline is awake and alert and will respond to his name but has difficulty answering questions consistently and appropriately   Assessment/Plan: Acute problems: Out of hospital cardiac arrest secondary to STEMI -ROSC and subsequent catheterization revealed disease in circumflex requiring  DES -Continue beta-blocker, statin, Plavix, isosorbide and hydralazine  Ischemic cardiomyopathy -Echo this admission EF 40% -Continue pharmacotherapy as above -No ACE inhibitor secondary to underlying kidney disease -Continue hydralazine and Isordil -Will need follow-up echo 3 months post initial echo which was completed on 07/23/2020  Significant anoxic brain injury  secondary to cardiac arrest/deconditioning/gait instability -Consistently participating with PT and OT.  SLP has cleared for dysphagia 2 with thin liquids -Declined by CIR since family unable to provide 24/7 care.  Plan is for SNF -improved mentation after HS Seroquel decreased to 25 mg -Continue daytime Symmetrel  **PT NOTES FROM 2/7**    Pt progressing towards his physical therapy goals. Able to initiate bed mobility today with cueing and repetition. Requiring min-mod assist (+2 for safety) for functional mobility. Ambulating 100 ft x 2 with a walker and chair follow. Pt not able to self regulate or verbalize when fatigued. Continue to recommend SNF for ongoing Physical Therapy.      Hypertension -Was on Norvasc prior to admission -Continue cardiovascular meds as listed above  Acute on chronic kidney disease stage IIIb -Suspect prerenal etiology in the setting of hypoperfusion from ventricular fibrillation arrest -Baseline creatinine between 1.6 and 2; creatinine on 12/19 was 1.67-will repeat in a.m. on 08/19/2020 -Continue free water per tube and intermittently follow labs  Dysphagia, moderate protein calorie malnutrition Nutrition Problem: Inadequate oral intake Etiology: inability to eat Signs/Symptoms: NPO status  -Currently on nocturnal tube feedings.  Since 2/4 patient has eaten between 60 and 95% of meals.  If this trend continues likely can transition to oral diet only. -1/24 Resumed nocturnal TFs been ongoing poor oral intake during the day -Currently on D2 diet with thin liquids Interventions: Tube feeding,Prostat Estimated body mass index is 25.74 kg/m as calculated from the following:   Height as of this encounter: 6' (1.829 m).   Weight as of this encounter: 86.1 kg.  Skin tear right lateral back Wound / Incision (Open or Dehisced) 07/28/20 Skin tear Back Lateral;Right;Upper (Active)  Date First Assessed/Time First Assessed: 07/28/20 0300   Wound Type: Skin tear   Location: Back  Location Orientation: Lateral;Right;Upper  Present on Admission: No    Assessments 07/28/2020  7:49 PM 09/23/2020  9:05 AM  Dressing Type None Foam - Lift dressing to assess site every shift  Dressing Change Frequency -- PRN  Drainage Amount None --  Treatment Cleansed --     No Linked orders to display    Other problems: Acute hypernatremia -Continue free water per tube  E. coli UTI -Has completed 5 days of Bactrim  Hyperglycemia without an underlying diagnosis of diabetes mellitus Resolved -Likely related to acute stress -Recent issues with hypoglycemia so sliding scale insulin decreased to very sensitive and Levemir decreased from 30 twice daily to 15 twice daily as of 1/14 -Hemoglobin A1c 4.7  -1/18 refusing CBG checks so TF held-will stop TF and dc Levemir and SSI. If TF resumed can resume SSI and CBG checks -Serum glucose on 1/19 was 103  Acute hypoxemic respiratory failure -Resolved -Stable on room air with O2 sats between 97 and 99%  Nonsustained VT -Continue amiodarone and beta-blocker -1/31 LFTs normal   Data Reviewed: Basic Metabolic Panel: No results for input(s): NA, K, CL, CO2, GLUCOSE, BUN, CREATININE, CALCIUM, MG, PHOS in the last 168 hours. Liver Function Tests: No results for input(s): AST, ALT, ALKPHOS, BILITOT, PROT, ALBUMIN in the last 168 hours. No results for input(s): LIPASE, AMYLASE in the last 168 hours. No  results for input(s): AMMONIA in the last 168 hours. CBC: No results for input(s): WBC, NEUTROABS, HGB, HCT, MCV, PLT in the last 168 hours. Cardiac Enzymes: No results for input(s): CKTOTAL, CKMB, CKMBINDEX, TROPONINI in the last 168 hours. BNP (last 3 results) No results for input(s): BNP in the last 8760 hours.  ProBNP (last 3 results) No results for input(s): PROBNP in the last 8760 hours.  CBG: Recent Labs  Lab 09/25/20 0456 09/25/20 0943 09/25/20 1135 09/25/20 1554 09/26/20 0542  GLUCAP 189* 173* 142* 129*  163*    No results found for this or any previous visit (from the past 240 hour(s)).   Studies: No results found.  Scheduled Meds: . amantadine  100 mg Per Tube Daily  . aspirin  81 mg Per Tube Daily  . atorvastatin  80 mg Per Tube Daily  . bethanechol  10 mg Per Tube TID  . carvedilol  25 mg Per Tube BID WC  . chlorhexidine gluconate (MEDLINE KIT)  15 mL Mouth Rinse BID  . clopidogrel  75 mg Per Tube Daily  . COVID-19 mRNA vaccine (Moderna)  0.25 mL Intramuscular Once  . enoxaparin (LOVENOX) injection  40 mg Subcutaneous Q24H  . feeding supplement (PROSource TF)  45 mL Per Tube BID  . free water  200 mL Per Tube Q2H  . hydrALAZINE  37.5 mg Per Tube Q8H  . insulin aspart  0-15 Units Subcutaneous Q6H  . isosorbide dinitrate  20 mg Per Tube TID  . mouth rinse  15 mL Mouth Rinse BID  . polyvinyl alcohol  1 drop Both Eyes QID  . QUEtiapine  25 mg Per Tube QHS  . sodium chloride flush  3 mL Intravenous Q12H   Continuous Infusions: . feeding supplement (JEVITY 1.5 CAL/FIBER) 780 mL (09/25/20 2104)    Active Problems:   Acute ST elevation myocardial infarction (STEMI) [facility])   Cardiac arrest (HCC)   Elevated blood pressure reading with diagnosis of hypertension   Shock circulatory (HCC)   Encephalopathy   History of ETT   Cardiomyopathy, ischemic   Anoxic brain damage ([place])   Dysphagia   DNR (do not resuscitate) discussion   Gait instability   E. coli UTI   Consultants:  Cardiology  Interventional radiology  PCCM  Neurology  Procedures:  Cardiac catheterization 12/4  Continuous EEG 12/4 and overnight EEG with video 12/5  Echocardiogram 12/5  EEG 12/9  Percutaneous PEG tube placement 12/30  Antibiotics: Anti-infectives (From admission, onward)   Start     Dose/Rate Route Frequency Ordered Stop   09/13/20 1000  sulfamethoxazole-trimethoprim (BACTRIM) 200-40 MG/5ML suspension 20 mL        20 mL Per Tube Every 12 hours 09/13/20 0734 09/19/20 0926    09/13/20 0745  cefTRIAXone (ROCEPHIN) 1 g in sodium chloride 0.9 % 100 mL IVPB  Status:  Discontinued        1 g 200 mL/hr over 30 Minutes Intravenous Every 24 hours 09/13/20 0658 09/13/20 0734   08/17/20 0000  ceFAZolin (ANCEF) IVPB 2g/100 mL premix        2 g 200 mL/hr over 30 Minutes Intravenous To Radiology 08/15/20 1346 08/17/20 1111   07/22/20 0830  cefTRIAXone (ROCEPHIN) 2 g in sodium chloride 0.9 % 100 mL IVPB        2 g 200 mL/hr over 30 Minutes Intravenous Every 24 hours 07/22/20 0720 07/26/20 0858       Time spent: 20 minutes    Erin Hearing ANP  [facility] 7 am - 330 pm/M-F for direct patient care and secure chat Please refer to [place] for contact information 66 days

## 2020-09-27 DIAGNOSIS — R531 Weakness: Secondary | ICD-10-CM

## 2020-09-27 LAB — GLUCOSE, CAPILLARY
Glucose-Capillary: 174 mg/dL — ABNORMAL HIGH (ref 70–99)
Glucose-Capillary: 156 mg/dL — ABNORMAL HIGH (ref 70–99)
Glucose-Capillary: 97 mg/dL (ref 70–99)

## 2020-09-27 NOTE — Progress Notes (Signed)
Patient ID: Antonio Navarro, male   DOB: [birth year], 60 y.o.   MRN: [id number]  This NP reviewed medical records , received report from team and then visited patient at the bedside as a follow up  for palliative medicine needs and emotional support.  Patient is awake and makes eye contact when spoken to.  He is lethargic and unable to follow commands today.  I spoke first today with Antonio Navarro/sister and then son/Antonio Navarro  by telephone to offer emotional support, and answer questions and concerns regarding current medical situation.  Patient does not have medical decisional capacity at this time .   His legal decision maker is his adult son Antonio Navarro  Ongoing education regarding diagnosis and likely long-term trajectory secondary to hypoxic brain injury.   This is likely [name]'s new  baseline.  Education offered on the importance of making medical decisions that are within the context of the patient's values and goals of care.  Education offered on the difference between aggressive medical intervention path and a palliative comfort path for this patient at this time in the situation.  I shared with his son my concern that his dad is high risk for decompensation into the future secondary to the fact that he is nonmobile, total care and risk for aspiration resulting in cyclical infection.  Education offered on the importance of documentation of advanced care planning, raised awareness specifically to [name]. Encouraged family to consider DNR/DNI status understanding evidenced based poor outcomes in similar hospitalized patient, as the cause of arrest is likely associated with advanced chronic illness rather than an easily reversible acute cardio-pulmonary event.    MOST form again re-introduced  Education offered on the likely next steps of transition of care to a skilled nursing facility for ongoing rehabilitation.    Questions and concerns addressed   I explained that PMT will be signing off at this time,  he has full support form attendings/ therapies/nurisng teams at this time.  If at any time family have questions or concerns they are encouraged to call PMT, they have the information.  Total time spent on the unit was 35 minutes  Greater than 50% of the time was spent in counseling and coordination of care  Wadie Lessen NP  Palliative Medicine Team Team Phone # [phone] Pager [phone]

## 2020-09-27 NOTE — TOC Progression Note (Addendum)
Transition of Care [facility]) - Progression Note    Patient Details  Name: Antonio Navarro MRN: [id number] Date of Birth: [birth year]  Transition of Care [facility]) CM/SW Contact  Curlene Labrum, RN Phone Number: 09/27/2020, 12:39 PM  Clinical Narrative:    Case management spoke with Jonelle Sidle, [place] at [facility] and she received the patient's clinicals via secure email and she plans to review them today to establish if the facility is able to offer a bed to the patient for possible long term care SNF placement.  CM and MSW will continue to follow the patient for SNF placement.  I spoke with Theressa Stamps with [facility] with financial counseling and I created a provider letter, signed by Erin Hearing, NP, and sent it to her in regards to establishing disability for the patient.   Expected Discharge Plan: Skilled Nursing Facility Barriers to Discharge: Continued Medical Work up,No SNF bed  Expected Discharge Plan and Services Expected Discharge Plan: [place] arrangements for the past 2 months: Single Family Home                                       Social Determinants of Health (SDOH) Interventions    Readmission Risk Interventions No flowsheet data found.

## 2020-09-27 NOTE — Progress Notes (Signed)
Physical Therapy Treatment Patient Details Name: Antonio Navarro MRN: [id number] DOB: 1960-12-30 Today's Date: 09/27/2020    History of Present Illness [name] with history of tobacco abuse, obesity, HTN who was admitted 07/22/20 post cardiac arrest with inferolateral STEMI. He was resuscitated in the field by EMS after being found to be in V fib. Approximately 30 minutes of CPR. He was intubated on arrival.  Pt with anoxic brain injury. PEG placed on 08/17/20    PT Comments    Pt making excellent progress towards his physical therapy goals; increased verbalizations today and improved initiation and activity tolerance. Pt ambulating 120 ft, then an additional 40 ft with a walker, mod assist and chair follow. Continues with balance deficits, cognitive impairments, and weakness. Continue to recommend SNF for ongoing Physical Therapy.      Follow Up Recommendations  SNF     Equipment Recommendations  None recommended by PT    Recommendations for Other Services       Precautions / Restrictions Precautions Precautions: Fall;Other (comment) Precaution Comments: mitts, gtube Restrictions Weight Bearing Restrictions: No    Mobility  Bed Mobility Overal bed mobility: Needs Assistance Bed Mobility: Supine to Sit     Supine to sit: Min guard     General bed mobility comments: Good initiation, no physical assist required  Transfers Overall transfer level: Needs assistance Equipment used: Rolling walker (2 wheeled) Transfers: Sit to/from Stand Sit to Stand: +2 safety/equipment;Min assist;Min guard         General transfer comment: Min guard to rise from edge of bed, good power up. MinA to rise from recliner  Ambulation/Gait Ambulation/Gait assistance: Mod assist;+2 safety/equipment Gait Distance (Feet): 160 Feet (120", 40") Assistive device: Rolling walker (2 wheeled) Gait Pattern/deviations: Narrow base of support;Decreased stride length;Step-through pattern Gait  velocity: decreased   General Gait Details: Manual assist for walker negotiation, cues for proximity, modA for balance. Pt with increased posterior lean when fatigued. Occasionally scissors when distracted and performing head turns   Marine scientist [place] (Stroke Patients Only)       Balance Overall balance assessment: Needs assistance Sitting-balance support: Feet supported Sitting balance-Leahy Scale: Fair     Standing balance support: Bilateral upper extremity supported Standing balance-Leahy Scale: Poor Standing balance comment: requires BUE support during mobility                            Cognition Arousal/Alertness: Awake/alert Behavior During Therapy: Flat affect Overall Cognitive Status: Impaired/Different from baseline Area of Impairment: Attention;Following commands;Awareness                 Orientation Level: Disoriented to;Place;Time;Situation Current Attention Level: Selective Memory: Decreased short-term memory;Decreased recall of precautions Following Commands: Follows one step commands inconsistently;Follows one step commands with increased time Safety/Judgement: Decreased awareness of deficits;Decreased awareness of safety Awareness: Intellectual Problem Solving: Slow processing;Difficulty sequencing;Decreased initiation;Requires verbal cues;Requires tactile cues General Comments: Increased verbalizations today, pt stating, "I am ready to get out of here." At times, indiscernable speech. Following 1 step commands > 75% of the time, improved initiation.      Exercises      General Comments        Pertinent Vitals/Pain Pain Assessment: Faces Faces Pain Scale: No hurt    Home Living  Prior Function            PT Goals (current goals can now be found in the care plan section) Acute Rehab PT Goals Patient Stated Goal: none stated PT Goal Formulation: With  patient Time For Goal Achievement: 10/09/20 Potential to Achieve Goals: Fair Progress towards PT goals: Progressing toward goals    Frequency    Min 3X/week      PT Plan Current plan remains appropriate    Co-evaluation              AM-PAC PT "6 Clicks" Mobility   Outcome Measure  Help needed turning from your back to your side while in a flat bed without using bedrails?: A Little Help needed moving from lying on your back to sitting on the side of a flat bed without using bedrails?: A Little Help needed moving to and from a bed to a chair (including a wheelchair)?: A Little Help needed standing up from a chair using your arms (e.g., wheelchair or bedside chair)?: A Little Help needed to walk in hospital room?: A Lot Help needed climbing 3-5 steps with a railing? : A Lot 6 Click Score: 16    End of Session Equipment Utilized During Treatment: Gait belt Activity Tolerance: Patient tolerated treatment well Patient left: with call bell/phone within reach;in chair;with chair alarm set;with family/visitor present Nurse Communication: Mobility status PT Visit Diagnosis: Other abnormalities of gait and mobility (R26.89)     Time: 1062-6948 PT Time Calculation (min) (ACUTE ONLY): 22 min  Charges:  $Gait Training: 8-22 mins                     Wyona Almas, PT, DPT Acute Rehabilitation Services Pager [phone] Office [phone]    Deno Etienne 09/27/2020, 1:07 PM

## 2020-09-27 NOTE — Progress Notes (Signed)
[facility] PROGRESS NOTE  Antonio Navarro IRS:[id number] DOB: [birth year] DOA: 07/22/2020 PCP: Patient, No Pcp Per       Status: Remains inpatient appropriate because:Altered mental status and Unsafe d/c plan   Dispo: The patient is from: Home              Anticipated d/c is to: SNF              Anticipated d/c date is: > 3 days              Patient currently is medically stable to d/c.  Barriers to discharge: Medicaid and disability pending. No SNF bed offers   Code Status: Full Family Communication: 2/7 HIPPA appropriate voicemail left for patient's sister Antonio Navarro explaining loss of SNF bed and the circumstances surrounding that.  Also updated her that his clinicals have been submitted to another facility in [place]. DVT prophylaxis: sq heparin Vaccination status: Completed both doses of Pfizer Covid vaccination on 12/14/2019; no option for Pfizer booster at this facility therefore [facility] to ordered on 09/21/2020  Foley catheter: No  HPI: 60 year old male history of hypertension prescribed Norvasc prior to admission, chronic back pain, prior renal calculi who experienced an out of hospital cardiac arrest with downtime of 9 minutes.  Initial rhythm was ventricular fibrillation.  He was defibrillated deceived 6 doses of epi before ROSC.  Subsequent work-up revealed STEMI.  He underwent cardiac catheterization with a DES placed to the circumflex.  Cardiology following and has placed patient on Brilinta and aspirin and is also being treated with beta-blocker and statins.  From a neurological standpoint he clinically has anoxic brain injury.  During this hospitalization he experienced AKI and was treated with fluids and correction of hypoperfusion from cardiac arrest.  Echo performed this admission revealed ischemic cardiomyopathy with an EF of 40%  In addition to above patient has been unable to pass swallowing evaluations and subsequently has undergone percutaneous PEG tube  placement on 12/30.  Patient is awake and moving all extremities spontaneously but does not follow commands and does not verbalize palliative care has met with patient and family and has emphasized poor prognosis and will continue to meet with the family during the hospitalization.  Current plan is to transition to SNF bed once available.  Expect need for lifelong 24/7 care for all ADLs.  Subjective: Awake, more interactive today, attempting to answering questions but having difficulty finding the correct answers.  He is able to definitively state that his name is Antonio Navarro.  Objective: Vitals:   09/26/20 2139 09/27/20 0655  BP: 122/77 (!) 134/91  Pulse: 93 76  Resp: 19 16  Temp: 98 F (36.7 C) 97.8 F (36.6 C)  SpO2: 96% 96%    Intake/Output Summary (Last 24 hours) at 09/27/2020 0736 Last data filed at 09/27/2020 0200 Gross per 24 hour  Intake 1580 ml  Output 1100 ml  Net 480 ml   Filed Weights   09/22/20 0450 09/23/20 0530 09/25/20 0548  Weight: 83.6 kg 85.8 kg 86.1 kg    Exam: Constitutional: Awake and alert, no acute distress Cardiovascular: Normal heart sounds, pulse regular nontachycardic, extremities warm to touch with adequate capillary refill Abdomen: Abdomen soft nontender nondistended with normoactive bowel sounds.  LBM 2/8 Neurologic: CN 2-12 roughly without any appreciable focal neurological deficits, MOE x4, strength is 4/5.   Psychiatric: Oriented times name only.  Pleasant affect   Assessment/Plan: Acute problems: Out of hospital cardiac arrest secondary to STEMI -ROSC and subsequent  catheterization revealed disease in circumflex requiring DES -Continue beta-blocker, statin, Plavix, isosorbide and hydralazine  Ischemic cardiomyopathy -Echo this admission EF 40% -Continue pharmacotherapy as above -No ACE inhibitor secondary to underlying kidney disease -Continue hydralazine and Isordil -Will need follow-up echo 3 months post initial echo which was completed on  07/23/2020  Significant anoxic brain injury secondary to cardiac arrest/deconditioning/gait instability -Consistently participating with PT and OT.  SLP has cleared for dysphagia 2 with thin liquids -Declined by CIR since family unable to provide 24/7 care.  Plan is for SNF -improved mentation after HS Seroquel decreased to 25 mg -Continue daytime Symmetrel  **PT NOTES FROM 2/7**    Pt progressing towards his physical therapy goals. Able to initiate bed mobility today with cueing and repetition. Requiring min-mod assist (+2 for safety) for functional mobility. Ambulating 100 ft x 2 with a walker and chair follow. Pt not able to self regulate or verbalize when fatigued. Continue to recommend SNF for ongoing Physical Therapy.      Hypertension -Was on Norvasc prior to admission -Continue cardiovascular meds as listed above  Acute on chronic kidney disease stage IIIb -Suspect prerenal etiology in the setting of hypoperfusion from ventricular fibrillation arrest -Baseline creatinine between 1.6 and 2; creatinine on 12/19 was 1.67-will repeat in a.m. on 08/19/2020 -Continue free water per tube and intermittently follow labs  Dysphagia, moderate protein calorie malnutrition Nutrition Problem: Inadequate oral intake Etiology: inability to eat Signs/Symptoms: NPO status  -Currently on nocturnal tube feedings.  Since 2/4 patient has eaten between 60 and 95% of meals.  If this trend continues likely can transition to oral diet only. -1/24 Resumed nocturnal TFs been ongoing poor oral intake during the day -Currently on D2 diet with thin liquids -Repeat SLP eval on 2/8 continues to recommend D2 diet with thin liquids. Interventions: Tube feeding,Prostat Estimated body mass index is 25.74 kg/m as calculated from the following:   Height as of this encounter: 6' (1.829 m).   Weight as of this encounter: 86.1 kg.  Skin tear right lateral back Wound / Incision (Open or Dehisced) 07/28/20 Skin tear  Back Lateral;Right;Upper (Active)  Date First Assessed/Time First Assessed: 07/28/20 0300   Wound Type: Skin tear  Location: Back  Location Orientation: Lateral;Right;Upper  Present on Admission: No    Assessments 07/28/2020  7:49 PM 09/26/2020  9:35 PM  Dressing Type None Foam - Lift dressing to assess site every shift  Dressing Status -- None  Dressing Change Frequency -- PRN  Site / Wound Assessment -- Clean;Dry  % Wound base Red or Granulating -- 0%  % Wound base Yellow/Fibrinous Exudate -- 0%  % Wound base Black/Eschar -- 0%  % Wound base Other/Granulation Tissue (Comment) -- 0%  Peri-wound Assessment -- Intact  Wound Length (cm) -- 0 cm  Wound Width (cm) -- 0 cm  Wound Depth (cm) -- 0 cm  Wound Volume (cm^3) -- 0 cm^3  Wound Surface Area (cm^2) -- 0 cm^2  Margins -- Attached edges (approximated)  Closure -- None  Drainage Amount None None  Drainage Description -- No odor  Non-staged Wound Description -- Not applicable  Treatment Cleansed Cleansed     No Linked orders to display    Other problems: Acute hypernatremia -Continue free water per tube  E. coli UTI -Has completed 5 days of Bactrim  Hyperglycemia without an underlying diagnosis of diabetes mellitus Resolved -Likely related to acute stress -Recent issues with hypoglycemia so sliding scale insulin decreased to very sensitive and Levemir  decreased from 30 twice daily to 15 twice daily as of 1/14 -Hemoglobin A1c 4.7  -1/18 refusing CBG checks so TF held-will stop TF and dc Levemir and SSI. If TF resumed can resume SSI and CBG checks -Serum glucose on 1/19 was 103  Acute hypoxemic respiratory failure -Resolved -Stable on room air with O2 sats between 97 and 99%  Nonsustained VT -Continue amiodarone and beta-blocker -1/31 LFTs normal   Data Reviewed: Basic Metabolic Panel: No results for input(s): NA, K, CL, CO2, GLUCOSE, BUN, CREATININE, CALCIUM, MG, PHOS in the last 168 hours. Liver Function Tests: No  results for input(s): AST, ALT, ALKPHOS, BILITOT, PROT, ALBUMIN in the last 168 hours. No results for input(s): LIPASE, AMYLASE in the last 168 hours. No results for input(s): AMMONIA in the last 168 hours. CBC: No results for input(s): WBC, NEUTROABS, HGB, HCT, MCV, PLT in the last 168 hours. Cardiac Enzymes: No results for input(s): CKTOTAL, CKMB, CKMBINDEX, TROPONINI in the last 168 hours. BNP (last 3 results) No results for input(s): BNP in the last 8760 hours.  ProBNP (last 3 results) No results for input(s): PROBNP in the last 8760 hours.  CBG: Recent Labs  Lab 09/26/20 0542 09/26/20 1114 09/26/20 1615 09/26/20 2355 09/27/20 0638  GLUCAP 163* 161* 141* 114* 174*    No results found for this or any previous visit (from the past 240 hour(s)).   Studies: No results found.  Scheduled Meds: . amantadine  100 mg Per Tube Daily  . aspirin  81 mg Per Tube Daily  . atorvastatin  80 mg Per Tube Daily  . bethanechol  10 mg Per Tube TID  . carvedilol  25 mg Per Tube BID WC  . chlorhexidine gluconate (MEDLINE KIT)  15 mL Mouth Rinse BID  . clopidogrel  75 mg Per Tube Daily  . COVID-19 mRNA vaccine (Moderna)  0.25 mL Intramuscular Once  . enoxaparin (LOVENOX) injection  40 mg Subcutaneous Q24H  . feeding supplement (PROSource TF)  45 mL Per Tube BID  . free water  200 mL Per Tube Q2H  . hydrALAZINE  37.5 mg Per Tube Q8H  . insulin aspart  0-15 Units Subcutaneous Q6H  . isosorbide dinitrate  20 mg Per Tube TID  . mouth rinse  15 mL Mouth Rinse BID  . polyvinyl alcohol  1 drop Both Eyes QID  . QUEtiapine  25 mg Per Tube QHS  . sodium chloride flush  3 mL Intravenous Q12H   Continuous Infusions: . feeding supplement (JEVITY 1.5 CAL/FIBER) 780 mL (09/26/20 2128)    Active Problems:   Acute ST elevation myocardial infarction (STEMI) (HCC)   Cardiac arrest (HCC)   Elevated blood pressure reading with diagnosis of hypertension   Shock circulatory (HCC)   Encephalopathy    History of ETT   Cardiomyopathy, ischemic   Anoxic brain damage ([place])   Dysphagia   DNR (do not resuscitate) discussion   Gait instability   E. coli UTI   Consultants:  Cardiology  Interventional radiology  PCCM  Neurology  Procedures:  Cardiac catheterization 12/4  Continuous EEG 12/4 and overnight EEG with video 12/5  Echocardiogram 12/5  EEG 12/9  Percutaneous PEG tube placement 12/30  Antibiotics: Anti-infectives (From admission, onward)   Start     Dose/Rate Route Frequency Ordered Stop   09/13/20 1000  sulfamethoxazole-trimethoprim (BACTRIM) 200-40 MG/5ML suspension 20 mL        20 mL Per Tube Every 12 hours 09/13/20 0734 09/19/20 0926   09/13/20  0745  cefTRIAXone (ROCEPHIN) 1 g in sodium chloride 0.9 % 100 mL IVPB  Status:  Discontinued        1 g 200 mL/hr over 30 Minutes Intravenous Every 24 hours 09/13/20 0658 09/13/20 0734   08/17/20 0000  ceFAZolin (ANCEF) IVPB 2g/100 mL premix        2 g 200 mL/hr over 30 Minutes Intravenous To Radiology 08/15/20 1346 08/17/20 1111   07/22/20 0830  cefTRIAXone (ROCEPHIN) 2 g in sodium chloride 0.9 % 100 mL IVPB        2 g 200 mL/hr over 30 Minutes Intravenous Every 24 hours 07/22/20 0720 07/26/20 0858       Time spent: 20 minutes    Erin Hearing ANP  [facility] 7 am - 330 pm/M-F for direct patient care and secure chat Please refer to Amion for contact information 67 days

## 2020-09-28 LAB — GLUCOSE, CAPILLARY
Glucose-Capillary: 103 mg/dL — ABNORMAL HIGH (ref 70–99)
Glucose-Capillary: 143 mg/dL — ABNORMAL HIGH (ref 70–99)
Glucose-Capillary: 154 mg/dL — ABNORMAL HIGH (ref 70–99)
Glucose-Capillary: 167 mg/dL — ABNORMAL HIGH (ref 70–99)
Glucose-Capillary: 168 mg/dL — ABNORMAL HIGH (ref 70–99)
Glucose-Capillary: 173 mg/dL — ABNORMAL HIGH (ref 70–99)

## 2020-09-28 NOTE — Progress Notes (Signed)
Nutrition Follow-up  DOCUMENTATION CODES:   Not applicable  INTERVENTION:   -ContinueMagic cup TID with meals, each supplement provides 290 kcal and 9 grams of protein -Continue feeding assistance with meals -Continuenocturnal feedings:  Jevity 1.5@ 77ml/hr via PEG over 12 hour period  3ml Prosource TF BID   Continue 200 ml free water flush every 3 hours  Tube feeding regimen provides1250kcal (54% of needs),72grams of protein, and 53ml of H2O. Total free water: 2193 ml daily  NUTRITION DIAGNOSIS:   Inadequate oral intake related to inability to eat as evidenced by NPO status.  Ongoing  GOAL:   Patient will meet greater than or equal to 90% of their needs  Progressing   MONITOR:   Diet advancement,Labs,Weight trends,TF tolerance,Skin,I & O's  REASON FOR ASSESSMENT:   Consult,Ventilator Enteral/tube feeding initiation and management  ASSESSMENT:   Patient with PMH significant for HTN and kidney stones. Presents this admission s/p cardiac arrest.  12/13- NGT d/c 12/15- cortrak placed, tip of tube in the stomach 12/17- s/p BSE- recommend continue NPO 12/19- pt pulled cortrak tube, replaced- placement verified by x-ray (stomach) 12/20- s/p BSE- pt refusing PO trials 12/30- cortrak removed, PEG placed 1/5- Advanced to thin liquid diet 1/6- Advanced to full liquid diet 1/11- Advanced to dysphagia 2 diet 1/14- transitioned to nocturnal feedings by MD 1/18- TF d/c by MD 1/24- calorie cont completed- pt consuming about 50% of needs PO; nocturnal feedings resumed  Reviewed I/O's: +3.1 L x 24 hours and -4.5 L since 09/14/20  UOP: 1.1 L x 24 hours  Pt sleeping soundly at time of visit. He did not respond to voice.  Pt remains with erratic intake. Noted meal completion 30-100%. Nocturnal feedings resumed on 09/11/20; pt tolerating well.   Per chart review, pt continues to await SNF placement. He is medically stable for discharge.  Medications  reviewed.   Labs reviewed: CBGS: 97-168 (inpatient orders for glycemic control are 0-15 units insulin aspart every 6 hours ).   Diet Order:   Diet Order            DIET DYS 2 Room service appropriate? No; Fluid consistency: Thin  Diet effective now                 EDUCATION NEEDS:   Not appropriate for education at this time  Skin:  Skin Assessment: Skin Integrity Issues: Skin Integrity Issues:: Other (Comment) Other: MASD to buttocks, skin tear to rt upper back  Last BM:  09/28/20  Height:   Ht Readings from Last 1 Encounters:  09/03/20 6' (1.829 m)    Weight:   Wt Readings from Last 1 Encounters:  09/28/20 87.4 kg    Ideal Body Weight:  80.9 kg  BMI:  Body mass index is 26.13 kg/m.  Estimated Nutritional Needs:   Kcal:  2300-2500 kcal  Protein:  115-130 grams  Fluid:  >/= 2 L/day    Loistine Chance, RD, LDN, [place] Registered Dietitian II Certified Diabetes Care and Education Specialist Please refer to [name] for RD and/or RD on-call/weekend/after hours pager

## 2020-09-28 NOTE — Progress Notes (Signed)
Occupational Therapy Treatment Patient Details Name: Antonio Navarro MRN: [id number] DOB: [birth year] Today's Date: 09/28/2020    History of present illness [name] with history of tobacco abuse, obesity, HTN who was admitted 07/22/20 post cardiac arrest with inferolateral STEMI. He was resuscitated in the field by EMS after being found to be in V fib. Approximately 30 minutes of CPR. He was intubated on arrival.  Pt with anoxic brain injury. PEG placed on 08/17/20   OT comments  Patient assisted up to the recliner with [place] and lap belt place with nursing aware patient is up with lunch tray present.  OT will continue to work with him in the acute setting to maximize his functional abilities prior to discharge to SNF.  He continues to work with rehab freely, and requires gentle verbal and tactile cues to participate.  The patient would really benefit from a SNF stay, where his day could be more structured to encourage out of bed, and activities.    Follow Up Recommendations  SNF;Supervision/Assistance - 24 hour    Equipment Recommendations  Wheelchair (measurements OT);Wheelchair cushion (measurements OT);Hospital bed;3 in 1 bedside commode    Recommendations for Other Services      Precautions / Restrictions Precautions Precautions: Fall;Other (comment) Precaution Comments: mitts and chair alarm Restrictions Weight Bearing Restrictions: No       Mobility Bed Mobility Overal bed mobility: Needs Assistance Bed Mobility: Sidelying to Sit   Sidelying to sit: Min assist          Transfers Overall transfer level: Needs assistance   Transfers: Sit to/from Stand;Stand Pivot Transfers Sit to Stand: Min assist Stand pivot transfers: Min assist            Balance           Standing balance support: Single extremity supported Standing balance-Leahy Scale: Poor Standing balance comment: requires BUE support during mobility                                                 Cognition Arousal/Alertness: Awake/alert Behavior During Therapy: Flat affect Overall Cognitive Status: Impaired/Different from baseline                   Orientation Level: Disoriented to;Place;Time;Situation           Problem Solving: Slow processing;Difficulty sequencing;Decreased initiation;Requires verbal cues;Requires tactile cues           Prior Functioning/Environment              Frequency  Min 2X/week        Progress Toward Goals  OT Goals(current goals can now be found in the care plan section)  Progress towards OT goals: Progressing toward goals  Acute Rehab OT Goals Patient Stated Goal: none stated OT Goal Formulation: Patient unable to participate in goal setting Time For Goal Achievement: 10/12/20 Potential to Achieve Goals: Fair ADL Goals Pt Will Perform Eating: with adaptive utensils;with min assist Pt Will Transfer to Toilet: with min guard assist;ambulating;regular height toilet  Plan Discharge plan remains appropriate;Frequency remains appropriate    Co-evaluation                 AM-PAC OT "6 Clicks" Daily Activity     Outcome Measure   Help from another person eating meals?: A Lot Help from another person  taking care of personal grooming?: A Lot Help from another person toileting, which includes using toliet, bedpan, or urinal?: A Lot Help from another person bathing (including washing, rinsing, drying)?: A Lot Help from another person to put on and taking off regular upper body clothing?: A Lot Help from another person to put on and taking off regular lower body clothing?: A Lot 6 Click Score: 12    End of Session Equipment Utilized During Treatment: Gait belt  OT Visit Diagnosis: Muscle weakness (generalized) (M62.81);Pain;Other abnormalities of gait and mobility (R26.89);Cognitive communication deficit (R41.841);Low vision, both eyes (H54.2)   Activity Tolerance Patient tolerated  treatment well   Patient Left in chair;with call bell/phone within reach;with chair alarm set   Nurse Communication Other (comment) (up in recliner)        Time: 1203-1220 OT Time Calculation (min): 17 min  Charges: OT General Charges $OT Visit: 1 Visit OT Treatments $Self Care/Home Management : 8-22 mins  09/28/2020  Rich, OTR/L  Acute Rehabilitation Services  Office:  [phone]    Metta Clines 09/28/2020, 1:29 PM

## 2020-09-28 NOTE — TOC Progression Note (Signed)
Transition of Care [facility]) - Progression Note    Patient Details  Name: Antonio Navarro MRN: [id number] Date of Birth: 1960-12-25  Transition of Care [facility]) CM/SW Contact  Curlene Labrum, RN Phone Number: 09/28/2020, 12:37 PM  Clinical Narrative:    CM left a voice message and secure text message with Tiffany, CM at [facility] to inquire if patient is able to be accepted to the facility under an LOG by [facility].    CM and MSW will continue to follow the patient for possible admission opportunities for SNF accepting a LOG.  Patient has pending Medicaid application and disability in process.  Provider letter was sent via secure email to financial counseling yesterday, 09/27/2020.   Expected Discharge Plan: Skilled Nursing Facility Barriers to Discharge: Continued Medical Work up,No SNF bed  Expected Discharge Plan and Services Expected Discharge Plan: [place] arrangements for the past 2 months: Single Family Home                                       Social Determinants of Health (SDOH) Interventions    Readmission Risk Interventions No flowsheet data found.

## 2020-09-28 NOTE — Progress Notes (Signed)
[facility] PROGRESS NOTE  Antonio Navarro PFX:[id number] DOB: [birth year] DOA: 07/22/2020 PCP: Patient, No Pcp Per       Status: Remains inpatient appropriate because:Altered mental status and Unsafe d/c plan   Dispo: The patient is from: Home              Anticipated d/c is to: SNF              Anticipated d/c date is: > 3 days              Patient currently is medically stable to d/c.  Barriers to discharge: Medicaid and disability pending. No SNF bed offers   Code Status: Full Family Communication: 2/7 HIPPA appropriate voicemail left for patient's sister Antonio Navarro explaining loss of SNF bed and the circumstances surrounding that.  Also updated her that his clinicals have been submitted to another facility in [place]. DVT prophylaxis: sq heparin Vaccination status: Completed both doses of Pfizer Covid vaccination on 12/14/2019; no option for Pfizer booster at this facility therefore [facility] to ordered on 09/21/2020  Foley catheter: No  HPI: 60 year old male history of hypertension prescribed Norvasc prior to admission, chronic back pain, prior renal calculi who experienced an out of hospital cardiac arrest with downtime of 9 minutes.  Initial rhythm was ventricular fibrillation.  He was defibrillated deceived 6 doses of epi before ROSC.  Subsequent work-up revealed STEMI.  He underwent cardiac catheterization with a DES placed to the circumflex.  Cardiology following and has placed patient on Brilinta and aspirin and is also being treated with beta-blocker and statins.  From a neurological standpoint he clinically has anoxic brain injury.  During this hospitalization he experienced AKI and was treated with fluids and correction of hypoperfusion from cardiac arrest.  Echo performed this admission revealed ischemic cardiomyopathy with an EF of 40%  In addition to above patient has been unable to pass swallowing evaluations and subsequently has undergone percutaneous PEG tube  placement on 12/30.  Patient is awake and moving all extremities spontaneously but does not follow commands and does not verbalize palliative care has met with patient and family and has emphasized poor prognosis and will continue to meet with the family during the hospitalization.  Current plan is to transition to SNF bed once available.  Expect need for lifelong 24/7 care for all ADLs.  Subjective: Sleepy.  Briefly awakens.  Not engaging in conversation today.  Objective: Vitals:   09/27/20 1948 09/28/20 0344  BP: (!) 143/97 (!) 142/93  Pulse: 96 93  Resp: 20 18  Temp: 97.8 F (36.6 C) 98.4 F (36.9 C)  SpO2: 100% 97%    Intake/Output Summary (Last 24 hours) at 09/28/2020 0744 Last data filed at 09/28/2020 0600 Gross per 24 hour  Intake 4240 ml  Output 1100 ml  Net 3140 ml   Filed Weights   09/23/20 0530 09/25/20 0548 09/28/20 0344  Weight: 85.8 kg 86.1 kg 87.4 kg    Exam: Constitutional: Sleepy, no acute distress Cardiovascular: Heart sounds S1-S2, pulse regular without peripheral edema, extremities warm to touch Abdomen: Abdomen soft nontender nondistended.  PEG tube nocturnal tube feedings.  LBM 2/9 Neurologic: CN 2-12 roughly without any appreciable focal neurological deficits, MOE x4, strength is 4/5.   Psychiatric: Sleepy, not verbally engaging today so unable to accurately assess orientation.   Assessment/Plan: Acute problems: Out of hospital cardiac arrest secondary to STEMI -ROSC and subsequent catheterization revealed disease in circumflex requiring DES -Continue beta-blocker, statin, Plavix, isosorbide and hydralazine  Ischemic cardiomyopathy -Echo this admission EF 40% -Continue pharmacotherapy as above (hydralazine and Isordil) -No ACE inhibitor secondary to underlying kidney disease -Will need follow-up echo 3 months post initial echo which was completed on 07/23/2020  Significant anoxic brain injury secondary to cardiac arrest/deconditioning/gait  instability -Consistently participating with PT and OT.  SLP has cleared for dysphagia 2 with thin liquids -Declined by CIR since family unable to provide 24/7 care.  Plan is for SNF -Continue HS Seroquel 25 mg -Continue daytime Symmetrel  **PT NOTES FROM 2/7**    Pt progressing towards his physical therapy goals. Able to initiate bed mobility today with cueing and repetition. Requiring min-mod assist (+2 for safety) for functional mobility. Ambulating 100 ft x 2 with a walker and chair follow. Pt not able to self regulate or verbalize when fatigued. Continue to recommend SNF for ongoing Physical Therapy.      Hypertension -Was on Norvasc prior to admission -Continue cardiovascular meds as listed above  Acute on chronic kidney disease stage IIIb -Suspect prerenal etiology in the setting of hypoperfusion from ventricular fibrillation arrest -Baseline creatinine between 1.6 and 2; creatinine on 12/19 was 1.67-will repeat in a.m. on 08/19/2020 -Continue free water per tube and intermittently follow labs  Dysphagia, moderate protein calorie malnutrition Nutrition Problem: Inadequate oral intake Etiology: inability to eat Signs/Symptoms: NPO status  -Currently on nocturnal tube feedings.  Since 2/4 patient has eaten between 60 and 95% of meals.  If this trend continues likely can transition to oral diet only. -1/24 Resumed nocturnal TFs been ongoing poor oral intake during the day -Currently on D2 diet with thin liquids -Repeat SLP eval on 2/8 continues to recommend D2 diet with thin liquids. Interventions: Tube feeding,Prostat Estimated body mass index is 26.13 kg/m as calculated from the following:   Height as of this encounter: 6' (1.829 m).   Weight as of this encounter: 87.4 kg.     Other problems: Acute hypernatremia -Continue free water per tube  E. coli UTI -Has completed 5 days of Bactrim  Hyperglycemia without an underlying diagnosis of diabetes  mellitus Resolved -Likely related to acute stress -Recent issues with hypoglycemia so sliding scale insulin decreased to very sensitive and Levemir decreased from 30 twice daily to 15 twice daily as of 1/14 -Hemoglobin A1c 4.7  -1/18 refusing CBG checks so TF held-will stop TF and dc Levemir and SSI. If TF resumed can resume SSI and CBG checks -Serum glucose on 1/19 was 103  Acute hypoxemic respiratory failure -Resolved -Stable on room air with O2 sats between 97 and 99%  Nonsustained VT -Continue amiodarone and beta-blocker -1/31 LFTs normal  Skin tear right lateral back Wound / Incision (Open or Dehisced) 07/28/20 Skin tear Back Lateral;Right;Upper (Active)  Date First Assessed/Time First Assessed: 07/28/20 0300   Wound Type: Skin tear  Location: Back  Location Orientation: Lateral;Right;Upper  Present on Admission: No    Assessments 07/28/2020  7:49 PM 09/26/2020  9:35 PM  Dressing Type None Foam - Lift dressing to assess site every shift  Dressing Status -- None  Dressing Change Frequency -- PRN  Site / Wound Assessment -- Clean;Dry  % Wound base Red or Granulating -- 0%  % Wound base Yellow/Fibrinous Exudate -- 0%  % Wound base Black/Eschar -- 0%  % Wound base Other/Granulation Tissue (Comment) -- 0%  Peri-wound Assessment -- Intact  Wound Length (cm) -- 0 cm  Wound Width (cm) -- 0 cm  Wound Depth (cm) -- 0 cm  Wound Volume (  cm^3) -- 0 cm^3  Wound Surface Area (cm^2) -- 0 cm^2  Margins -- Attached edges (approximated)  Closure -- None  Drainage Amount None None  Drainage Description -- No odor  Non-staged Wound Description -- Not applicable  Treatment Cleansed Cleansed     No Linked orders to display    Data Reviewed: Basic Metabolic Panel: No results for input(s): NA, K, CL, CO2, GLUCOSE, BUN, CREATININE, CALCIUM, MG, PHOS in the last 168 hours. Liver Function Tests: No results for input(s): AST, ALT, ALKPHOS, BILITOT, PROT, ALBUMIN in the last 168 hours. No  results for input(s): LIPASE, AMYLASE in the last 168 hours. No results for input(s): AMMONIA in the last 168 hours. CBC: No results for input(s): WBC, NEUTROABS, HGB, HCT, MCV, PLT in the last 168 hours. Cardiac Enzymes: No results for input(s): CKTOTAL, CKMB, CKMBINDEX, TROPONINI in the last 168 hours. BNP (last 3 results) No results for input(s): BNP in the last 8760 hours.  ProBNP (last 3 results) No results for input(s): PROBNP in the last 8760 hours.  CBG: Recent Labs  Lab 09/27/20 0638 09/27/20 1131 09/27/20 1828 09/28/20 0134 09/28/20 0634  GLUCAP 174* 156* 97 154* 167*    No results found for this or any previous visit (from the past 240 hour(s)).   Studies: No results found.  Scheduled Meds: . amantadine  100 mg Per Tube Daily  . aspirin  81 mg Per Tube Daily  . atorvastatin  80 mg Per Tube Daily  . bethanechol  10 mg Per Tube TID  . carvedilol  25 mg Per Tube BID WC  . chlorhexidine gluconate (MEDLINE KIT)  15 mL Mouth Rinse BID  . clopidogrel  75 mg Per Tube Daily  . COVID-19 mRNA vaccine (Moderna)  0.25 mL Intramuscular Once  . enoxaparin (LOVENOX) injection  40 mg Subcutaneous Q24H  . feeding supplement (PROSource TF)  45 mL Per Tube BID  . free water  200 mL Per Tube Q2H  . hydrALAZINE  37.5 mg Per Tube Q8H  . insulin aspart  0-15 Units Subcutaneous Q6H  . isosorbide dinitrate  20 mg Per Tube TID  . mouth rinse  15 mL Mouth Rinse BID  . polyvinyl alcohol  1 drop Both Eyes QID  . QUEtiapine  25 mg Per Tube QHS  . sodium chloride flush  3 mL Intravenous Q12H   Continuous Infusions: . feeding supplement (JEVITY 1.5 CAL/FIBER) 780 mL (09/27/20 2140)    Active Problems:   Acute ST elevation myocardial infarction (STEMI) (HCC)   Cardiac arrest (HCC)   Elevated blood pressure reading with diagnosis of hypertension   Shock circulatory (HCC)   Encephalopathy   History of ETT   Cardiomyopathy, ischemic   Anoxic brain damage ([place])   Dysphagia   DNR  (do not resuscitate) discussion   Gait instability   E. coli UTI   Consultants:  Cardiology  Interventional radiology  PCCM  Neurology  Procedures:  Cardiac catheterization 12/4  Continuous EEG 12/4 and overnight EEG with video 12/5  Echocardiogram 12/5  EEG 12/9  Percutaneous PEG tube placement 12/30  Antibiotics: Anti-infectives (From admission, onward)   Start     Dose/Rate Route Frequency Ordered Stop   09/13/20 1000  sulfamethoxazole-trimethoprim (BACTRIM) 200-40 MG/5ML suspension 20 mL        20 mL Per Tube Every 12 hours 09/13/20 0734 09/19/20 0926   09/13/20 0745  cefTRIAXone (ROCEPHIN) 1 g in sodium chloride 0.9 % 100 mL IVPB  Status:  Discontinued  1 g 200 mL/hr over 30 Minutes Intravenous Every 24 hours 09/13/20 0658 09/13/20 0734   08/17/20 0000  ceFAZolin (ANCEF) IVPB 2g/100 mL premix        2 g 200 mL/hr over 30 Minutes Intravenous To Radiology 08/15/20 1346 08/17/20 1111   07/22/20 0830  cefTRIAXone (ROCEPHIN) 2 g in sodium chloride 0.9 % 100 mL IVPB        2 g 200 mL/hr over 30 Minutes Intravenous Every 24 hours 07/22/20 0720 07/26/20 0858       Time spent: 20 minutes    Erin Hearing ANP  [facility] 7 am - 330 pm/M-F for direct patient care and secure chat Please refer to Amion for contact information 68 days

## 2020-09-29 LAB — GLUCOSE, CAPILLARY
Glucose-Capillary: 117 mg/dL — ABNORMAL HIGH (ref 70–99)
Glucose-Capillary: 147 mg/dL — ABNORMAL HIGH (ref 70–99)
Glucose-Capillary: 148 mg/dL — ABNORMAL HIGH (ref 70–99)
Glucose-Capillary: 167 mg/dL — ABNORMAL HIGH (ref 70–99)

## 2020-09-29 NOTE — Progress Notes (Signed)
CSW attempted to reach Tiffany in admissions at the [facility] without success - a voicemail was left requesting a return call.  [name], MSW, LCSW Transitions of Care  Clinical Social Worker II [phone]

## 2020-09-29 NOTE — Progress Notes (Signed)
[facility] PROGRESS NOTE  Antonio Navarro [id number]:[id number] DOB: [birth year] DOA: 07/22/2020 PCP: Patient, No Pcp Per       Status: Remains inpatient appropriate because:Altered mental status and Unsafe d/c plan   Dispo: The patient is from: Home              Anticipated d/c is to: SNF              Anticipated d/c date is: > 3 days              Patient currently is medically stable to d/c.  Barriers to discharge: Medicaid and disability pending. No SNF bed offers   Code Status: Full Family Communication: 2/7 HIPPA appropriate voicemail left for patient's sister [name] explaining loss of SNF bed and the circumstances surrounding that.  Also updated her that his clinicals have been submitted to another facility in [place]. DVT prophylaxis: sq heparin Vaccination status: Completed both doses of Pfizer Covid vaccination on 12/14/2019; no option for Pfizer booster at this facility therefore [facility] [place] place to ordered on 09/21/2020  Foley catheter: No  HPI: 60 year old male history of hypertension prescribed Norvasc prior to admission, chronic back pain, prior renal calculi who experienced an out of hospital cardiac arrest with downtime of 9 minutes.  Initial rhythm was ventricular fibrillation.  He was defibrillated deceived 6 doses of epi before ROSC.  Subsequent work-up revealed STEMI.  He underwent cardiac catheterization with a DES placed to the circumflex.  Cardiology following and has placed patient on Brilinta and aspirin and is also being treated with beta-blocker and statins.  From a neurological standpoint he clinically has anoxic brain injury.  During this hospitalization he experienced AKI and was treated with fluids and correction of hypoperfusion from cardiac arrest.  Echo performed this admission revealed ischemic cardiomyopathy with an EF of 40%  In addition to above patient has been unable to pass swallowing evaluations and subsequently has undergone percutaneous PEG tube  placement on 12/30.  Patient is awake and moving all extremities spontaneously but does not follow commands and does not verbalize palliative care has met with patient and family and has emphasized poor prognosis and will continue to meet with the family during the hospitalization.  Current plan is to transition to SNF bed once available.  Expect need for lifelong 24/7 care for all ADLs.  Subjective: Sleeping soundly so did not awaken  Objective: Vitals:   09/28/20 2009 09/29/20 0449  BP: (!) 156/92 (P) 122/87  Pulse: 90 (P) 87  Resp: 18 (P) 18  Temp: 98.1 F (36.7 C) (!) (P) 97.5 F (36.4 C)  SpO2: 96% (P) 98%    Intake/Output Summary (Last 24 hours) at 09/29/2020 0825 Last data filed at 09/29/2020 0160 Gross per 24 hour  Intake 3560 ml  Output 500 ml  Net 3060 ml   Filed Weights   09/25/20 0548 09/28/20 0344 09/29/20 0449  Weight: 86.1 kg 87.4 kg (P) 87.2 kg    Exam: Constitutional: Sleeping, calm in no acute distress Cardiovascular: Heart sounds, pulse regular, extremities warm to touch Abdomen: Abdomen soft with normoactive bowel sounds.  PEG tube nocturnal tube feedings.  LBM 2/11 Neurologic: CN 2-12 roughly without any appreciable focal neurological deficits, MOE x4, strength is 4/5.   Psychiatric: Sleeping   Assessment/Plan: Acute problems: Out of hospital cardiac arrest secondary to STEMI -ROSC and subsequent catheterization revealed disease in circumflex requiring DES -Continue beta-blocker, statin, Plavix, isosorbide and hydralazine  Ischemic cardiomyopathy -Echo this admission EF 40% -  Continue pharmacotherapy as above (hydralazine and Isordil) -No ACE inhibitor secondary to underlying kidney disease -Will need follow-up echo 3 months post initial echo which was completed on 07/23/2020  Significant anoxic brain injury secondary to cardiac arrest/deconditioning/gait instability -Consistently participating with PT and OT.  SLP has cleared for dysphagia 2 with  thin liquids -Declined by CIR since family unable to provide 24/7 care.  Plan is for SNF -Continue HS Seroquel 25 mg -Continue daytime Symmetrel  **PT NOTES FROM 2/7**    Pt progressing towards his physical therapy goals. Able to initiate bed mobility today with cueing and repetition. Requiring min-mod assist (+2 for safety) for functional mobility. Ambulating 100 ft x 2 with a walker and chair follow. Pt not able to self regulate or verbalize when fatigued. Continue to recommend SNF for ongoing Physical Therapy.      Hypertension -Was on Norvasc prior to admission -Continue cardiovascular meds as listed above  Acute on chronic kidney disease stage IIIb -Suspect prerenal etiology in the setting of hypoperfusion from ventricular fibrillation arrest -Baseline creatinine between 1.6 and 2; creatinine on 12/19 was 1.67-will repeat in a.m. on 08/19/2020 -Continue free water per tube and intermittently follow labs  Dysphagia, moderate protein calorie malnutrition Nutrition Problem: Inadequate oral intake Etiology: inability to eat Signs/Symptoms: NPO status  -Currently on nocturnal tube feedings.  Since 2/4 patient has eaten between 60 and 95% of meals.  If this trend continues likely can transition to oral diet only. -1/24 Resumed nocturnal TFs been ongoing poor oral intake during the day -Currently on D2 diet with thin liquids -Repeat SLP eval on 2/8 continues to recommend D2 diet with thin liquids. Interventions: Tube feeding,Prostat Estimated body mass index is 26.07 kg/m (pended) as calculated from the following:   Height as of this encounter: 6' (1.829 m).   Weight as of this encounter: (P) 87.2 kg.     Other problems: Acute hypernatremia -Continue free water per tube  E. coli UTI -Has completed 5 days of Bactrim  Hyperglycemia without an underlying diagnosis of diabetes mellitus Resolved -Likely related to acute stress -Recent issues with hypoglycemia so sliding scale  insulin decreased to very sensitive and Levemir decreased from 30 twice daily to 15 twice daily as of 1/14 -Hemoglobin A1c 4.7   Acute hypoxemic respiratory failure -Resolved -Stable on room air with O2 sats between 97 and 99%  Nonsustained VT -Continue amiodarone and beta-blocker -1/31 LFTs normal  Skin tear right lateral back Wound / Incision (Open or Dehisced) 07/28/20 Skin tear Back Lateral;Right;Upper (Active)  Date First Assessed/Time First Assessed: 07/28/20 0300   Wound Type: Skin tear  Location: Back  Location Orientation: Lateral;Right;Upper  Present on Admission: No    Assessments 07/28/2020  7:49 PM 09/26/2020  9:35 PM  Dressing Type None Foam - Lift dressing to assess site every shift  Dressing Status -- None  Dressing Change Frequency -- PRN  Site / Wound Assessment -- Clean;Dry  % Wound base Red or Granulating -- 0%  % Wound base Yellow/Fibrinous Exudate -- 0%  % Wound base Black/Eschar -- 0%  % Wound base Other/Granulation Tissue (Comment) -- 0%  Peri-wound Assessment -- Intact  Wound Length (cm) -- 0 cm  Wound Width (cm) -- 0 cm  Wound Depth (cm) -- 0 cm  Wound Volume (cm^3) -- 0 cm^3  Wound Surface Area (cm^2) -- 0 cm^2  Margins -- Attached edges (approximated)  Closure -- None  Drainage Amount None None  Drainage Description -- No odor  Non-staged Wound Description -- Not applicable  Treatment Cleansed Cleansed     No Linked orders to display    Data Reviewed: Basic Metabolic Panel: No results for input(s): NA, K, CL, CO2, GLUCOSE, BUN, CREATININE, CALCIUM, MG, PHOS in the last 168 hours. Liver Function Tests: No results for input(s): AST, ALT, ALKPHOS, BILITOT, PROT, ALBUMIN in the last 168 hours. No results for input(s): LIPASE, AMYLASE in the last 168 hours. No results for input(s): AMMONIA in the last 168 hours. CBC: No results for input(s): WBC, NEUTROABS, HGB, HCT, MCV, PLT in the last 168 hours. Cardiac Enzymes: No results for input(s):  CKTOTAL, CKMB, CKMBINDEX, TROPONINI in the last 168 hours. BNP (last 3 results) No results for input(s): BNP in the last 8760 hours.  ProBNP (last 3 results) No results for input(s): PROBNP in the last 8760 hours.  CBG: Recent Labs  Lab 09/28/20 0804 09/28/20 1252 09/28/20 1626 09/28/20 2344 09/29/20 0522  GLUCAP 168* 173* 143* 103* 167*    No results found for this or any previous visit (from the past 240 hour(s)).   Studies: No results found.  Scheduled Meds: . amantadine  100 mg Per Tube Daily  . aspirin  81 mg Per Tube Daily  . atorvastatin  80 mg Per Tube Daily  . bethanechol  10 mg Per Tube TID  . carvedilol  25 mg Per Tube BID WC  . chlorhexidine gluconate (MEDLINE KIT)  15 mL Mouth Rinse BID  . clopidogrel  75 mg Per Tube Daily  . COVID-19 mRNA vaccine (Moderna)  0.25 mL Intramuscular Once  . enoxaparin (LOVENOX) injection  40 mg Subcutaneous Q24H  . feeding supplement (PROSource TF)  45 mL Per Tube BID  . free water  200 mL Per Tube Q2H  . hydrALAZINE  37.5 mg Per Tube Q8H  . insulin aspart  0-15 Units Subcutaneous Q6H  . isosorbide dinitrate  20 mg Per Tube TID  . mouth rinse  15 mL Mouth Rinse BID  . polyvinyl alcohol  1 drop Both Eyes QID  . QUEtiapine  25 mg Per Tube QHS  . sodium chloride flush  3 mL Intravenous Q12H   Continuous Infusions: . feeding supplement (JEVITY 1.5 CAL/FIBER) 780 mL (09/28/20 2314)    Active Problems:   Acute ST elevation myocardial infarction (STEMI) (HCC)   Cardiac arrest (HCC)   Elevated blood pressure reading with diagnosis of hypertension   Shock circulatory (HCC)   Encephalopathy   History of ETT   Cardiomyopathy, ischemic   Anoxic brain damage ([place])   Dysphagia   DNR (do not resuscitate) discussion   Gait instability   E. coli UTI   Consultants:  Cardiology  Interventional radiology  PCCM  Neurology  Procedures:  Cardiac catheterization 12/4  Continuous EEG 12/4 and overnight EEG with video  12/5  Echocardiogram 12/5  EEG 12/9  Percutaneous PEG tube placement 12/30  Antibiotics: Anti-infectives (From admission, onward)   Start     Dose/Rate Route Frequency Ordered Stop   09/13/20 1000  sulfamethoxazole-trimethoprim (BACTRIM) 200-40 MG/5ML suspension 20 mL        20 mL Per Tube Every 12 hours 09/13/20 0734 09/19/20 0926   09/13/20 0745  cefTRIAXone (ROCEPHIN) 1 g in sodium chloride 0.9 % 100 mL IVPB  Status:  Discontinued        1 g 200 mL/hr over 30 Minutes Intravenous Every 24 hours 09/13/20 0658 09/13/20 0734   08/17/20 0000  ceFAZolin (ANCEF) IVPB 2g/100 mL premix  2 g 200 mL/hr over 30 Minutes Intravenous To Radiology 08/15/20 1346 08/17/20 1111   07/22/20 0830  cefTRIAXone (ROCEPHIN) 2 g in sodium chloride 0.9 % 100 mL IVPB        2 g 200 mL/hr over 30 Minutes Intravenous Every 24 hours 07/22/20 0720 07/26/20 0858       Time spent: 20 minutes    Erin Hearing ANP  [facility] 7 am - 330 pm/M-F for direct patient care and secure chat Please refer to Amion for contact information 69 days

## 2020-09-29 NOTE — Plan of Care (Signed)
  Problem: Health Behavior/Discharge Planning: Goal: Ability to manage health-related needs will improve Outcome: Progressing   Problem: Clinical Measurements: Goal: Ability to maintain clinical measurements within normal limits will improve Outcome: Progressing Goal: Will remain free from infection Outcome: Progressing   

## 2020-09-29 NOTE — Progress Notes (Signed)
Physical Therapy Treatment Patient Details Name: Antonio Navarro MRN: [id number] DOB: [birth year] Today's Date: 09/29/2020    History of Present Illness [name] with history of tobacco abuse, obesity, HTN who was admitted 07/22/20 post cardiac arrest with inferolateral STEMI. He was resuscitated in the field by EMS after being found to be in V fib. Approximately 30 minutes of CPR. He was intubated on arrival.  Pt with anoxic brain injury. PEG placed on 08/17/20.    PT Comments    Pt received in supine, significant other present and supportive, pt agreeable to therapy session with encouragement and good tolerance for mobility session. Pt requires increased time/cues to initiate and perform mobility tasks this date, per sig other Maudie Mercury he has been like this most of the day and seems more drowsy. Pt transitioned to EOB with minA and needed min to modA for sit<>stand from bed/chair heights with +2 for safety. VSS on RA during gait other than RR which was elevated in 30's rpm but improves at rest. Pt given close chair follow and performed gait trials x2 with mostly minA, up to [place] when turning with RW. Pt remained up in chair with Maudie Mercury present to assist with feeding at end of session. Pt continues to benefit from PT services to progress toward functional mobility goals. Continue to recommend SNF.   Follow Up Recommendations  SNF     Equipment Recommendations  None recommended by PT    Recommendations for Other Services       Precautions / Restrictions Precautions Precautions: Fall;Other (comment) Precaution Comments: mitts, gtube, lap belt chair alarm, abdominal binder Restrictions Weight Bearing Restrictions: No    Mobility  Bed Mobility Overal bed mobility: Needs Assistance Bed Mobility: Sidelying to Sit;Rolling Rolling: Min guard Sidelying to sit: Min assist       General bed mobility comments: tactile cues needed this date as pt slow to initiate, pt able to perform with min  guard, increased time, multimodal cues    Transfers Overall transfer level: Needs assistance Equipment used: Rolling walker (2 wheeled) Transfers: Sit to/from Stand Sit to Stand: +2 safety/equipment;Min assist;Mod assist         General transfer comment: initially modA to stand from EOB, from chair pt improves to minA x2 reps to RW  Ambulation/Gait Ambulation/Gait assistance: +2 safety/equipment;Min assist Gait Distance (Feet): 50 Feet (x2 with seated break between) Assistive device: Rolling walker (2 wheeled) Gait Pattern/deviations: Narrow base of support;Decreased stride length;Step-through pattern Gait velocity: decreased   General Gait Details: Manual assist for walker negotiation, cues for proximity, minA for balance. Pt tachypneic as mobility progressed however SpO2 94-100% on RA during mobility and HR to 110 bpm. Cues for wider BOS and closer proximity to RW with minimal carryover.   Stairs             Wheelchair Mobility    Modified Rankin (Stroke Patients Only)       Balance Overall balance assessment: Needs assistance Sitting-balance support: Feet supported Sitting balance-Leahy Scale: Fair Sitting balance - Comments: cues for upright posture needed as pt tending to try to return to supine initially, but more behavioral than balance related   Standing balance support: Bilateral upper extremity supported Standing balance-Leahy Scale: Poor Standing balance comment: requires BUE support during mobility and external assist                            Cognition Arousal/Alertness: Awake/alert Behavior During Therapy:  Flat affect Overall Cognitive Status: Impaired/Different from baseline Area of Impairment: Attention;Following commands;Awareness               Rancho Levels of Cognitive Functioning Rancho Los Amigos Scales of Cognitive Functioning: Confused/inappropriate/non-agitated Orientation Level: Disoriented  to;Place;Time;Situation Current Attention Level: Selective Memory: Decreased short-term memory;Decreased recall of precautions Following Commands: Follows one step commands inconsistently;Follows one step commands with increased time Safety/Judgement: Decreased awareness of deficits;Decreased awareness of safety Awareness: Intellectual Problem Solving: Slow processing;Difficulty sequencing;Decreased initiation;Requires verbal cues;Requires tactile cues General Comments: Minimal verbalizations this date, pt drowsy in supine but improved alertness once EOB/standing. Following 1 step commands 50-75% of the time, needs repetition of most cues today.      Exercises      General Comments General comments (skin integrity, edema, etc.): pt somewhat restless once up in chair at end of session, lap belt alarm donned for safety and pt fiancee in room to feed him, she is aware to notify RN and re-don mitts prior to leaving      Pertinent Vitals/Pain Pain Assessment: Faces Faces Pain Scale: No hurt Pain Intervention(s): Monitored during session;Repositioned    Home Living                      Prior Function            PT Goals (current goals can now be found in the care plan section) Acute Rehab PT Goals Patient Stated Goal: none stated PT Goal Formulation: With patient Time For Goal Achievement: 10/09/20 Potential to Achieve Goals: Fair Progress towards PT goals: Progressing toward goals    Frequency    Min 3X/week      PT Plan Current plan remains appropriate    Co-evaluation              AM-PAC PT "6 Clicks" Mobility   Outcome Measure  Help needed turning from your back to your side while in a flat bed without using bedrails?: A Little Help needed moving from lying on your back to sitting on the side of a flat bed without using bedrails?: A Little Help needed moving to and from a bed to a chair (including a wheelchair)?: A Little Help needed standing up from  a chair using your arms (e.g., wheelchair or bedside chair)?: A Little Help needed to walk in hospital room?: A Lot Help needed climbing 3-5 steps with a railing? : Total 6 Click Score: 15    End of Session Equipment Utilized During Treatment: Gait belt Activity Tolerance: Patient tolerated treatment well Patient left: with call bell/phone within reach;in chair;with chair alarm set;with family/visitor present Nurse Communication: Mobility status PT Visit Diagnosis: Other abnormalities of gait and mobility (R26.89)     Time: 1275-1700 PT Time Calculation (min) (ACUTE ONLY): 30 min  Charges:  $Gait Training: 8-22 mins $Therapeutic Activity: 8-22 mins                     Duwan Adrian P., PTA Acute Rehabilitation Services Pager: [phone] Office: [place] 09/29/2020, 2:32 PM

## 2020-09-30 LAB — BASIC METABOLIC PANEL
Anion gap: 10 (ref 5–15)
BUN: 15 mg/dL (ref 6–20)
CO2: 27 mmol/L (ref 22–32)
Calcium: 9.4 mg/dL (ref 8.9–10.3)
Chloride: 102 mmol/L (ref 98–111)
Creatinine, Ser: 1.26 mg/dL — ABNORMAL HIGH (ref 0.61–1.24)
GFR, Estimated: >60 mL/min (ref >60)
Glucose, Bld: 166 mg/dL — ABNORMAL HIGH (ref 70–99)
Potassium: 3.9 mmol/L (ref 3.5–5.1)
Sodium: 139 mmol/L (ref 135–145)

## 2020-09-30 LAB — CBC
HCT: 36.6 % — ABNORMAL LOW (ref 39.0–52.0)
Hemoglobin: 11.8 g/dL — ABNORMAL LOW (ref 13.0–17.0)
MCH: 31.6 pg (ref 26.0–34.0)
MCHC: 32.2 g/dL (ref 30.0–36.0)
MCV: 98.1 fL (ref 80.0–100.0)
Platelets: 199 10*3/uL (ref 150–400)
RBC: 3.73 MIL/uL — ABNORMAL LOW (ref 4.22–5.81)
RDW: 13.3 % (ref 11.5–15.5)
WBC: 6.6 10*3/uL (ref 4.0–10.5)
nRBC: 0 % (ref 0.0–0.2)

## 2020-09-30 LAB — GLUCOSE, CAPILLARY
Glucose-Capillary: 155 mg/dL — ABNORMAL HIGH (ref 70–99)
Glucose-Capillary: 156 mg/dL — ABNORMAL HIGH (ref 70–99)
Glucose-Capillary: 157 mg/dL — ABNORMAL HIGH (ref 70–99)
Glucose-Capillary: 163 mg/dL — ABNORMAL HIGH (ref 70–99)

## 2020-09-30 LAB — MAGNESIUM: Magnesium: 1.7 mg/dL (ref 1.7–2.4)

## 2020-09-30 NOTE — Progress Notes (Signed)
Patient seen and examined, he is alert, he was watching TV, he will stay few words difficult to understand.  General: Alert Cardiovascular: S1-S2 regular rhythm Lungs: Clear to auscultation  Patient has been awaiting placement for skilled nursing facility. 60 year old with past medical history significant for hypertension, who experienced an out of hospital cardiac arrest with downtime of 9 minutes. Subsequently was diagnosed with a STEMI. Underwent cardiac catheterization with a DES placed to the circumflex. Patient was not able to pass a swallow evaluation and underwent PEG tube placement.  Patient stable awaiting placement.

## 2020-09-30 NOTE — Progress Notes (Signed)
Minna Merritts the patient sister will be coming because wife having surgery

## 2020-09-30 NOTE — Progress Notes (Signed)
  Speech Language Pathology Treatment: Dysphagia;Cognitive-Linquistic  Patient Details Name: Antonio Navarro MRN: [id number] DOB: 12/16/1960 Today's Date: 09/30/2020 Time: 7290-2111 SLP Time Calculation (min) (ACUTE ONLY): 17 min  Assessment / Plan / Recommendation Clinical Impression  Ongoing dysphagia therapy provided with trials of regular textures and thin liquids; note prolonged oral prep with mild oral residue after the swallow which was cleared with a liquid wash. Pt did require cues and support to continue to attend to food in oral cavity. No overt s/sx of aspiration were noted with thin liquids. RN reports Pt often holds solids in his mouth and requires cues to swallow; recommend continue with current diet of D2/fine chop and thin liquids.   Cognitive-linguistic therapy targeting attention and following commands with functional tasks. Pt demonstrated gestures such as nodding his head and did follow basic commands with visual and occasional tactile cues. Pt said the names of children pictured in his room and demonstrated a big smile when looking at their faces. Despite the TV being muted, the visual stimulus was highly distracting for Pt and despite cues he continued to look at it. SLP turned off the TV and attention slightly improved, however he then began looking out the window. Pt followed basic commands during a functional task (washing face) with 30% accuracy, with max verbal and tactile cues slightly improved to 50% accuracy. ST will continue to follow acutely for dysphagia and communication. Thank you,   HPI HPI: Pt is a 60 yo male s/p arrest with downtime 9 minutes. Initial rhythm was V. fib, shocked and received 6 rounds of epi before ROSC achieved.  Total CPR time was 30 minutes. ETT 12/4-12/13. MRI 12/7 suggestive of a widespread anoxic injury. PMH includes kidney stones and HTN.      SLP Plan  Continue with current plan of care       Recommendations  Diet recommendations:  Dysphagia 2 (fine chop);Thin liquid Liquids provided via: Cup;Straw Medication Administration: Crushed with puree Supervision: Staff to assist with self feeding;Full supervision/cueing for compensatory strategies Compensations: Slow rate;Small sips/bites;Minimize environmental distractions Postural Changes and/or Swallow Maneuvers: Seated upright 90 degrees                Plan: Continue with current plan of care       Faheem Ziemann H. Roddie Mc, CCC-SLP Speech Language Pathologist   Wende Bushy 09/30/2020, 3:49 PM

## 2020-10-01 LAB — GLUCOSE, CAPILLARY
Glucose-Capillary: 176 mg/dL — ABNORMAL HIGH (ref 70–99)
Glucose-Capillary: 145 mg/dL — ABNORMAL HIGH (ref 70–99)
Glucose-Capillary: 185 mg/dL — ABNORMAL HIGH (ref 70–99)

## 2020-10-01 NOTE — Progress Notes (Signed)
Patient seen and examined. He is alert, he was  able to to tell me his name, he was not oriented to place. His speech is difficult to understand. aphasia  General: Alert, calm Cardiovascular: S1, S2 regular rhythm or rate Lungs: Clear to  auscultation  Patient has been awaiting placement for skilled nursing facility. 60 year old with past medical history significant for hypertension, who experienced an out of hospital cardiac arrest with downtime of 9 minutes. Subsequently was diagnosed with a STEMI. Underwent cardiac catheterization with a DES placed to the circumflex. Patient was not able to pass a swallow evaluation and underwent PEG tube placement.  Patient currently awaiting skilled nursing facility placement.

## 2020-10-02 LAB — GLUCOSE, CAPILLARY
Glucose-Capillary: 116 mg/dL — ABNORMAL HIGH (ref 70–99)
Glucose-Capillary: 118 mg/dL — ABNORMAL HIGH (ref 70–99)
Glucose-Capillary: 188 mg/dL — ABNORMAL HIGH (ref 70–99)
Glucose-Capillary: 96 mg/dL (ref 70–99)

## 2020-10-02 NOTE — Progress Notes (Signed)
[facility] PROGRESS NOTE  Antonio Navarro [name] DOB: 1961-02-07 DOA: 07/22/2020 PCP: Patient, No Pcp Per       Status: Remains inpatient appropriate because:Altered mental status and Unsafe d/c plan   Dispo: The patient is from: Home              Anticipated d/c is to: SNF              Anticipated d/c date is: > 3 days              Patient currently is medically stable to d/c.  Barriers to discharge: Medicaid and disability pending. No SNF bed offers   Code Status: Full Family Communication: 2/7 HIPPA appropriate voicemail left for patient's sister [name] explaining loss of SNF bed and the circumstances surrounding that.  Also updated her that his clinicals have been submitted to another facility in [place]. DVT prophylaxis: sq heparin Vaccination status: Completed both doses of Pfizer Covid vaccination on 12/14/2019; no option for Pfizer booster at this facility therefore [facility] to ordered on 09/21/2020  Foley catheter: No  HPI: 60 year old male history of hypertension prescribed Norvasc prior to admission, chronic back pain, prior renal calculi who experienced an out of hospital cardiac arrest with downtime of 9 minutes.  Initial rhythm was ventricular fibrillation.  He was defibrillated deceived 6 doses of epi before ROSC.  Subsequent work-up revealed STEMI.  He underwent cardiac catheterization with a DES placed to the circumflex.  Cardiology following and has placed patient on Brilinta and aspirin and is also being treated with beta-blocker and statins.  From a neurological standpoint he clinically has anoxic brain injury.  During this hospitalization he experienced AKI and was treated with fluids and correction of hypoperfusion from cardiac arrest.  Echo performed this admission revealed ischemic cardiomyopathy with an EF of 40%  In addition to above patient has been unable to pass swallowing evaluations and subsequently has undergone percutaneous PEG tube  placement on 12/30.  Patient is awake and moving all extremities spontaneously but does not follow commands and does not verbalize palliative care has met with patient and family and has emphasized poor prognosis and will continue to meet with the family during the hospitalization.  Current plan is to transition to SNF bed once available.  Expect need for lifelong 24/7 care for all ADLs.  Subjective: Blankets pulled up to chin and states he is cold.  Has no complaints when asked.  Continues to report dislike of dysphagia 2 consistency foods.  Objective: Vitals:   10/02/20 0000 10/02/20 0352  BP: 115/75 122/68  Pulse: 100 86  Resp: 20 18  Temp: 98.1 F (36.7 C) 98.1 F (36.7 C)  SpO2: 98% 100%    Intake/Output Summary (Last 24 hours) at 10/02/2020 0741 Last data filed at 10/02/2020 0400 Gross per 24 hour  Intake 4840 ml  Output 1550 ml  Net 3290 ml   Filed Weights   09/30/20 0419 10/01/20 0500 10/02/20 0500  Weight: 89.4 kg 88.9 kg 89.8 kg    Exam: Constitutional: Awake and alert and in no acute distress Cardiovascular: Normal heart sounds, extremities warm to touch with adequate capillary refill, normal heart sounds Abdomen: Soft nondistended.  Not eating much of D2 diet due to dislike of consistency.  PEG tube LBM 2/13 Neurologic: CN 2-12 roughly without any appreciable focal neurological deficits, MOE x4, strength is 4/5.   Psychiatric: Alert and oriented times name only.  Pleasant.   Assessment/Plan: Acute problems: Out of  hospital cardiac arrest secondary to STEMI -ROSC and subsequent catheterization revealed disease in circumflex requiring DES -Continue beta-blocker, statin, Plavix, isosorbide and hydralazine  Ischemic cardiomyopathy -Echo this admission EF 40% -Continue pharmacotherapy as above (hydralazine and Isordil) -No ACE inhibitor secondary to underlying kidney disease -Will need follow-up echo 3 months post initial echo which was completed on  07/23/2020  Significant anoxic brain injury secondary to cardiac arrest/deconditioning/gait instability -Consistently participating with PT and OT.  SLP has cleared for dysphagia 2 with thin liquids -Declined by CIR since family unable to provide 24/7 care.  Plan is for SNF -Continue HS Seroquel 25 mg -Continue daytime Symmetrel  **PT NOTES FROM 2/7**    Pt progressing towards his physical therapy goals. Able to initiate bed mobility today with cueing and repetition. Requiring min-mod assist (+2 for safety) for functional mobility. Ambulating 100 ft x 2 with a walker and chair follow. Pt not able to self regulate or verbalize when fatigued. Continue to recommend SNF for ongoing Physical Therapy.      Hypertension -Was on Norvasc prior to admission -Continue cardiovascular meds as listed above  Acute on chronic kidney disease stage IIIb -Suspect prerenal etiology in the setting of hypoperfusion from ventricular fibrillation arrest -Baseline creatinine between 1.6 and 2; creatinine on 12/19 was 1.67-will repeat in a.m. on 08/19/2020 -Continue free water per tube and intermittently follow labs  Dysphagia, moderate protein calorie malnutrition Nutrition Problem: Inadequate oral intake Etiology: inability to eat Signs/Symptoms: NPO status  -Currently on nocturnal tube feedings.  Since 2/4 patient has eaten between 60 and 95% of meals.  If this trend continues likely can transition to oral diet only. -1/24 Resumed nocturnal TFs been ongoing poor oral intake during the day -Currently on D2 diet with thin liquids -Repeat SLP eval on 2/8 continues to recommend D2 diet with thin liquids. Interventions: Tube feeding,Prostat Estimated body mass index is 26.85 kg/m as calculated from the following:   Height as of this encounter: 6' (1.829 m).   Weight as of this encounter: 89.8 kg.     Other problems: Acute hypernatremia -Continue free water per tube  E. coli UTI -Has completed 5 days  of Bactrim  Hyperglycemia without an underlying diagnosis of diabetes mellitus Resolved -Likely related to acute stress -Recent issues with hypoglycemia so sliding scale insulin decreased to very sensitive and Levemir decreased from 30 twice daily to 15 twice daily as of 1/14 -Hemoglobin A1c 4.7   Acute hypoxemic respiratory failure -Resolved -Stable on room air with O2 sats between 97 and 99%  Nonsustained VT -Continue amiodarone and beta-blocker -1/31 LFTs normal  Skin tear right lateral back Wound / Incision (Open or Dehisced) 07/28/20 Skin tear Back Lateral;Right;Upper (Active)  Date First Assessed/Time First Assessed: 07/28/20 0300   Wound Type: Skin tear  Location: Back  Location Orientation: Lateral;Right;Upper  Present on Admission: No    Assessments 07/28/2020  7:49 PM 09/26/2020  9:35 PM  Dressing Type None Foam - Lift dressing to assess site every shift  Dressing Status -- None  Dressing Change Frequency -- PRN  Site / Wound Assessment -- Clean;Dry  % Wound base Red or Granulating -- 0%  % Wound base Yellow/Fibrinous Exudate -- 0%  % Wound base Black/Eschar -- 0%  % Wound base Other/Granulation Tissue (Comment) -- 0%  Peri-wound Assessment -- Intact  Wound Length (cm) -- 0 cm  Wound Width (cm) -- 0 cm  Wound Depth (cm) -- 0 cm  Wound Volume (cm^3) -- 0 cm^3  Wound Surface Area (cm^2) -- 0 cm^2  Margins -- Attached edges (approximated)  Closure -- None  Drainage Amount None None  Drainage Description -- No odor  Non-staged Wound Description -- Not applicable  Treatment Cleansed Cleansed     No Linked orders to display    Data Reviewed: Basic Metabolic Panel: Recent Labs  Lab 09/30/20 0938  NA 139  K 3.9  CL 102  CO2 27  GLUCOSE 166*  BUN 15  CREATININE 1.26*  CALCIUM 9.4  MG 1.7   Liver Function Tests: No results for input(s): AST, ALT, ALKPHOS, BILITOT, PROT, ALBUMIN in the last 168 hours. No results for input(s): LIPASE, AMYLASE in the last 168  hours. No results for input(s): AMMONIA in the last 168 hours. CBC: Recent Labs  Lab 09/30/20 0938  WBC 6.6  HGB 11.8*  HCT 36.6*  MCV 98.1  PLT 199   Cardiac Enzymes: No results for input(s): CKTOTAL, CKMB, CKMBINDEX, TROPONINI in the last 168 hours. BNP (last 3 results) No results for input(s): BNP in the last 8760 hours.  ProBNP (last 3 results) No results for input(s): PROBNP in the last 8760 hours.  CBG: Recent Labs  Lab 10/01/20 0611 10/01/20 1129 10/01/20 1837 10/02/20 0021 10/02/20 0626  GLUCAP 176* 145* 185* 116* 118*    No results found for this or any previous visit (from the past 240 hour(s)).   Studies: No results found.  Scheduled Meds: . amantadine  100 mg Per Tube Daily  . aspirin  81 mg Per Tube Daily  . atorvastatin  80 mg Per Tube Daily  . bethanechol  10 mg Per Tube TID  . carvedilol  25 mg Per Tube BID WC  . chlorhexidine gluconate (MEDLINE KIT)  15 mL Mouth Rinse BID  . clopidogrel  75 mg Per Tube Daily  . COVID-19 mRNA vaccine (Moderna)  0.25 mL Intramuscular Once  . enoxaparin (LOVENOX) injection  40 mg Subcutaneous Q24H  . feeding supplement (PROSource TF)  45 mL Per Tube BID  . free water  200 mL Per Tube Q2H  . hydrALAZINE  37.5 mg Per Tube Q8H  . insulin aspart  0-15 Units Subcutaneous Q6H  . isosorbide dinitrate  20 mg Per Tube TID  . mouth rinse  15 mL Mouth Rinse BID  . polyvinyl alcohol  1 drop Both Eyes QID  . QUEtiapine  25 mg Per Tube QHS  . sodium chloride flush  3 mL Intravenous Q12H   Continuous Infusions: . feeding supplement (JEVITY 1.5 CAL/FIBER) 780 mL (10/02/20 0722)    Active Problems:   Acute ST elevation myocardial infarction (STEMI) ([place])   Cardiac arrest ([facility])   Elevated blood pressure reading with diagnosis of hypertension   Shock circulatory (HCC)   Encephalopathy   History of ETT   Cardiomyopathy, ischemic   Anoxic brain damage ([place])   Dysphagia   DNR (do not resuscitate) discussion   Gait  instability   E. coli UTI   Consultants:  Cardiology  Interventional radiology  PCCM  Neurology  Procedures:  Cardiac catheterization 12/4  Continuous EEG 12/4 and overnight EEG with video 12/5  Echocardiogram 12/5  EEG 12/9  Percutaneous PEG tube placement 12/30  Antibiotics: Anti-infectives (From admission, onward)   Start     Dose/Rate Route Frequency Ordered Stop   09/13/20 1000  sulfamethoxazole-trimethoprim (BACTRIM) 200-40 MG/5ML suspension 20 mL        20 mL Per Tube Every 12 hours 09/13/20 0734 09/19/20 0926  09/13/20 0745  cefTRIAXone (ROCEPHIN) 1 g in sodium chloride 0.9 % 100 mL IVPB  Status:  Discontinued        1 g 200 mL/hr over 30 Minutes Intravenous Every 24 hours 09/13/20 0658 09/13/20 0734   08/17/20 0000  ceFAZolin (ANCEF) IVPB 2g/100 mL premix        2 g 200 mL/hr over 30 Minutes Intravenous To Radiology 08/15/20 1346 08/17/20 1111   07/22/20 0830  cefTRIAXone (ROCEPHIN) 2 g in sodium chloride 0.9 % 100 mL IVPB        2 g 200 mL/hr over 30 Minutes Intravenous Every 24 hours 07/22/20 0720 07/26/20 0858       Time spent: 20 minutes    Erin Hearing ANP  [facility] 7 am - 330 pm/M-F for direct patient care and secure chat Please refer to Amion for contact information 72 days

## 2020-10-02 NOTE — Progress Notes (Signed)
Physical Therapy Treatment Patient Details Name: Antonio Navarro MRN: [id number] DOB: 01/18/1961 Today's Date: 10/02/2020    History of Present Illness [name] with history of tobacco abuse, obesity, HTN who was admitted 07/22/20 post cardiac arrest with inferolateral STEMI. He was resuscitated in the field by EMS after being found to be in V fib. Approximately 30 minutes of CPR. He was intubated on arrival.  Pt with anoxic brain injury. PEG placed on 08/17/20.    PT Comments    Patient received in bed, shaking head that he does not want to get up and walk. Patient requires multimodal cues to perform activities today. Requires mod/max assist for bed mobility and continually attempting to lie back down once seated. Patient required +2 max assist to transfer to recliner from bed. He will continue to benefit from skilled PT while here to improve functional independence and safety.     Follow Up Recommendations  SNF;Supervision/Assistance - 24 hour     Equipment Recommendations  None recommended by PT    Recommendations for Other Services       Precautions / Restrictions Precautions Precautions: Fall Precaution Comments: gtube, lap belt chair alarm Restrictions Weight Bearing Restrictions: No    Mobility  Bed Mobility Overal bed mobility: Needs Assistance Bed Mobility: Supine to Sit     Supine to sit: Mod assist;HOB elevated     General bed mobility comments: patient not initiating movement despite verbal, tactile, visual cues.    Transfers Overall transfer level: Needs assistance   Transfers: Squat Pivot Transfers;Sit to/from Stand Sit to Stand: Max assist;+2 physical assistance   Squat pivot transfers: Max assist;+2 physical assistance     General transfer comment: patient unable to perform sit to stand. Not assisting at all. with max +2 assist patient able to pivot to recliner.  Ambulation/Gait             General Gait Details: not attempted this date  based on transfer ability   Stairs             Wheelchair Mobility    Modified Rankin (Stroke Patients Only)       Balance Overall balance assessment: Needs assistance Sitting-balance support: Feet supported Sitting balance-Leahy Scale: Fair Sitting balance - Comments: patient continually trying to lie back down once sitting up on side of bed. Requiring mod assist to remain sitting. Postural control: Posterior lean Standing balance support: No upper extremity supported;During functional activity Standing balance-Leahy Scale: Poor Standing balance comment: requires BUE support during mobility and external assist                            Cognition Arousal/Alertness: Awake/alert Behavior During Therapy: Flat affect Overall Cognitive Status: Impaired/Different from baseline Area of Impairment: Attention;Following commands;Problem solving;Safety/judgement;Awareness               Rancho Levels of Cognitive Functioning Rancho Los Amigos Scales of Cognitive Functioning: Confused/inappropriate/non-agitated Orientation Level: Disoriented to;Place;Time;Situation Current Attention Level: Selective Memory: Decreased short-term memory;Decreased recall of precautions Following Commands: Follows one step commands inconsistently;Follows one step commands with increased time Safety/Judgement: Decreased awareness of deficits;Decreased awareness of safety Awareness: Intellectual Problem Solving: Slow processing;Difficulty sequencing;Decreased initiation;Requires verbal cues;Requires tactile cues General Comments: Minimal verbalizations this date, pt alert, but shaking head that he does not want to get up .      Exercises      General Comments        Pertinent Vitals/Pain  Pain Assessment: No/denies pain    Home Living                      Prior Function            PT Goals (current goals can now be found in the care plan section) Acute Rehab PT  Goals Patient Stated Goal: none stated PT Goal Formulation: With patient Time For Goal Achievement: 10/09/20 Progress towards PT goals: Progressing toward goals    Frequency    Min 3X/week      PT Plan Current plan remains appropriate    Co-evaluation              AM-PAC PT "6 Clicks" Mobility   Outcome Measure  Help needed turning from your back to your side while in a flat bed without using bedrails?: A Lot Help needed moving from lying on your back to sitting on the side of a flat bed without using bedrails?: A Lot Help needed moving to and from a bed to a chair (including a wheelchair)?: A Lot Help needed standing up from a chair using your arms (e.g., wheelchair or bedside chair)?: A Lot Help needed to walk in hospital room?: Total Help needed climbing 3-5 steps with a railing? : Total 6 Click Score: 10    End of Session Equipment Utilized During Treatment: Gait belt Activity Tolerance: Patient tolerated treatment well Patient left: with call bell/phone within reach;in chair;with chair alarm set Nurse Communication: Mobility status PT Visit Diagnosis: Other abnormalities of gait and mobility (R26.89);Difficulty in walking, not elsewhere classified (R26.2)     Time: 1115-1130 PT Time Calculation (min) (ACUTE ONLY): 15 min  Charges:  $Therapeutic Activity: 8-22 mins                     [employer], PT, GCS 10/02/20,12:31 PM

## 2020-10-03 LAB — GLUCOSE, CAPILLARY
Glucose-Capillary: 100 mg/dL — ABNORMAL HIGH (ref 70–99)
Glucose-Capillary: 145 mg/dL — ABNORMAL HIGH (ref 70–99)
Glucose-Capillary: 164 mg/dL — ABNORMAL HIGH (ref 70–99)
Glucose-Capillary: 193 mg/dL — ABNORMAL HIGH (ref 70–99)

## 2020-10-03 NOTE — Progress Notes (Signed)
CSW spoke with Rolla Plate at [facility] who states the facility is unable to offer the patient a bed due to facility capabilities.  [name], MSW, LCSW Transitions of Care  Clinical Social Worker II [phone]

## 2020-10-03 NOTE — Progress Notes (Signed)
Inpatient Rehabilitation Admissions Coordinator   Inpatient rehab consult received to reevaluate patient for a possible CIR admit. I last saw patient on 08/28/2020 for assessment of rehab venue options. I met with patient at bedside for assessment and then contacted his two sisters, [name] and [name] by phone. I sought clarification of caregiver supports at home that had changed since we last spoke on 1/10. Patient's fiance, [name], had her Cancer surgery yesterday 2/14. [name] and [facility] both state their abilities to provide 24/7 assist had not changed. They would speak again with family members and his fiance to clarify when they felt that could provide 24/7 care of him. I await their call back. I left TOC SW, Arbie Cookey, a voicemail to follow up with me to further discuss. Recommend to continue to seek SNF.  Danne Baxter, RN, MSN Rehab Admissions Coordinator [phone] 10/03/2020 5:13 PM

## 2020-10-03 NOTE — Progress Notes (Signed)
CSW spoke with patient's sister [name] at bedside to discuss attempts to place the patient in a facility. [name] reports she is interested in the patient possibly going to CIR as additional family support is now available. Patient has a girlfriend and two sisters in the area.  CSW informed DTP NP of information who will consult CIR for new evaluation.  [name], MSW, LCSW Transitions of Care  Clinical Social Worker II [phone]

## 2020-10-03 NOTE — Progress Notes (Signed)
Spoke with pt's sister Juventino Slovak on the phone. She states she would like to speak to the case manager sometime today while visiting with her brother.

## 2020-10-03 NOTE — Progress Notes (Addendum)
[facility] PROGRESS NOTE  Antonio Navarro FRT:[id number] DOB: 01-Jul-1961 DOA: 07/22/2020 PCP: Patient, No Pcp Per       Status: Remains inpatient appropriate because:Altered mental status and Unsafe d/c plan   Dispo: The patient is from: Home              Anticipated d/c is to: SNF              Anticipated d/c date is: > 3 days              Patient currently is medically stable to d/c.  Barriers to discharge: Medicaid and disability pending. No SNF bed offers   Code Status: Full Family Communication: LCSW spoke with patient's sister today on 2/15-would like CIR to review pt again DVT prophylaxis: sq heparin Vaccination status: Completed both doses of Pfizer Covid vaccination on 12/14/2019; no option for Pfizer booster at this facility therefore [facility] to ordered on 09/21/2020  Foley catheter: No  HPI: 60 year old male history of hypertension prescribed Norvasc prior to admission, chronic back pain, prior renal calculi who experienced an out of hospital cardiac arrest with downtime of 9 minutes.  Initial rhythm was ventricular fibrillation.  He was defibrillated deceived 6 doses of epi before ROSC.  Subsequent work-up revealed STEMI.  He underwent cardiac catheterization with a DES placed to the circumflex.  Cardiology following and has placed patient on Brilinta and aspirin and is also being treated with beta-blocker and statins.  From a neurological standpoint he clinically has anoxic brain injury.  During this hospitalization he experienced AKI and was treated with fluids and correction of hypoperfusion from cardiac arrest.  Echo performed this admission revealed ischemic cardiomyopathy with an EF of 40%  In addition to above patient has been unable to pass swallowing evaluations and subsequently has undergone percutaneous PEG tube placement on 12/30.  Patient is awake and moving all extremities spontaneously but does not follow commands and does not verbalize palliative care  has met with patient and family and has emphasized poor prognosis and will continue to meet with the family during the hospitalization.  Current plan is to transition to SNF bed once available.  Expect need for lifelong 24/7 care for all ADLs.  Subjective: Awake.  Was sleeping with blankets over his head.  Nurse stated he was slightly frustrated this morning but has not been aggressive or combative.  Objective: Vitals:   10/02/20 2100 10/03/20 0548  BP: 125/76 118/80  Pulse:  89  Resp:  19  Temp:  98.1 F (36.7 C)  SpO2:  97%    Intake/Output Summary (Last 24 hours) at 10/03/2020 0759 Last data filed at 10/03/2020 0300 Gross per 24 hour  Intake 2700 ml  Output 800 ml  Net 1900 ml   Filed Weights   10/01/20 0500 10/02/20 0500 10/03/20 0132  Weight: 88.9 kg 89.8 kg 86.5 kg    Exam: Constitutional: Awakens, calm, no acute distress Cardiovascular: Heart sounds S1-S2, pulses regular nontachycardic, extremities warm and no peripheral edema Abdomen: Soft nontender with normoactive bowel sounds.  Does not eat much of his D2 diet due to dislike of the texture.  PEG tube LBM 2/14 Neurologic: CN 2-12 roughly without any appreciable focal neurological deficits, MOE x4, strength is 4/5.   Psychiatric: Awake, oriented to name only.  More reserved today   Assessment/Plan: Acute problems: Out of hospital cardiac arrest secondary to STEMI -ROSC and subsequent catheterization revealed disease in circumflex requiring DES -Continue beta-blocker, statin, Plavix, isosorbide  and hydralazine  Significant anoxic brain injury secondary to cardiac arrest/deconditioning/gait instability -Consistently participating with PT and OT.  SLP has cleared for dysphagia 2 with thin liquids -Declined by CIR since family unable to provide 24/7 care.  Plan is for SNF -Continue HS Seroquel 25 mg -Continue daytime Symmetrel  **PT NOTES FROM 2/14**  Patient received in bed, shaking head that he does not want to  get up and walk. Patient requires multimodal cues to perform activities today. Requires mod/max assist for bed mobility and continually attempting to lie back down once seated. Patient required +2 max assist to transfer to recliner from bed. He will continue to benefit from skilled PT while here to improve functional independence and safety.     Ischemic cardiomyopathy -Echo this admission EF 40% -Continue hydralazine and Isordil -No ACE inhibitor secondary to underlying kidney disease -Will need follow-up echo 3 months post initial echo which was completed on 07/23/2020  Hypertension -Was on Norvasc prior to admission -Continue cardiovascular meds as listed above  Acute on chronic kidney disease stage IIIb -Suspect prerenal etiology in the setting of hypoperfusion from ventricular fibrillation arrest -Baseline creatinine between 1.6 and 2; creatinine on 12/19 was 1.67-will repeat in a.m. on 08/19/2020 -Continue free water per tube and intermittently follow labs  Dysphagia, moderate protein calorie malnutrition Nutrition Problem: Inadequate oral intake Etiology: inability to eat Signs/Symptoms: NPO status  -Currently on nocturnal tube feedings.  Since 2/4 patient has eaten between 60 and 95% of meals.  If this trend continues likely can transition to oral diet only. -1/24 Resumed nocturnal TFs been ongoing poor oral intake during the day -Currently on D2 diet with thin liquids -Repeat SLP eval on 2/8 continues to recommend D2 diet with thin liquids. Interventions: Tube feeding,Prostat Estimated body mass index is 25.86 kg/m as calculated from the following:   Height as of this encounter: 6' (1.829 m).   Weight as of this encounter: 86.5 kg.     Other problems: Acute hypernatremia -Continue free water per tube  E. coli UTI -Has completed 5 days of Bactrim  Hyperglycemia without an underlying diagnosis of diabetes mellitus Resolved -Likely related to acute stress -Recent  issues with hypoglycemia so sliding scale insulin decreased to very sensitive and Levemir decreased from 30 twice daily to 15 twice daily as of 1/14 -Hemoglobin A1c 4.7   Acute hypoxemic respiratory failure -Resolved -Stable on room air with O2 sats between 97 and 99%  Nonsustained VT -Continue amiodarone and beta-blocker -1/31 LFTs normal  Skin tear right lateral back Wound / Incision (Open or Dehisced) 07/28/20 Skin tear Back Lateral;Right;Upper (Active)  Date First Assessed/Time First Assessed: 07/28/20 0300   Wound Type: Skin tear  Location: Back  Location Orientation: Lateral;Right;Upper  Present on Admission: No    Assessments 07/28/2020  7:49 PM 09/26/2020  9:35 PM  Dressing Type None Foam - Lift dressing to assess site every shift  Dressing Status -- None  Dressing Change Frequency -- PRN  Site / Wound Assessment -- Clean;Dry  % Wound base Red or Granulating -- 0%  % Wound base Yellow/Fibrinous Exudate -- 0%  % Wound base Black/Eschar -- 0%  % Wound base Other/Granulation Tissue (Comment) -- 0%  Peri-wound Assessment -- Intact  Wound Length (cm) -- 0 cm  Wound Width (cm) -- 0 cm  Wound Depth (cm) -- 0 cm  Wound Volume (cm^3) -- 0 cm^3  Wound Surface Area (cm^2) -- 0 cm^2  Margins -- Attached edges (approximated)  Closure --  None  Drainage Amount None None  Drainage Description -- No odor  Non-staged Wound Description -- Not applicable  Treatment Cleansed Cleansed     No Linked orders to display    Data Reviewed: Basic Metabolic Panel: Recent Labs  Lab 09/30/20 0938  NA 139  K 3.9  CL 102  CO2 27  GLUCOSE 166*  BUN 15  CREATININE 1.26*  CALCIUM 9.4  MG 1.7   Liver Function Tests: No results for input(s): AST, ALT, ALKPHOS, BILITOT, PROT, ALBUMIN in the last 168 hours. No results for input(s): LIPASE, AMYLASE in the last 168 hours. No results for input(s): AMMONIA in the last 168 hours. CBC: Recent Labs  Lab 09/30/20 0938  WBC 6.6  HGB 11.8*  HCT  36.6*  MCV 98.1  PLT 199   Cardiac Enzymes: No results for input(s): CKTOTAL, CKMB, CKMBINDEX, TROPONINI in the last 168 hours. BNP (last 3 results) No results for input(s): BNP in the last 8760 hours.  ProBNP (last 3 results) No results for input(s): PROBNP in the last 8760 hours.  CBG: Recent Labs  Lab 10/02/20 0626 10/02/20 1202 10/02/20 1735 10/03/20 0016 10/03/20 0651  GLUCAP 118* 188* 96 145* 164*    No results found for this or any previous visit (from the past 240 hour(s)).   Studies: No results found.  Scheduled Meds: . amantadine  100 mg Per Tube Daily  . aspirin  81 mg Per Tube Daily  . atorvastatin  80 mg Per Tube Daily  . bethanechol  10 mg Per Tube TID  . carvedilol  25 mg Per Tube BID WC  . chlorhexidine gluconate (MEDLINE KIT)  15 mL Mouth Rinse BID  . clopidogrel  75 mg Per Tube Daily  . COVID-19 mRNA vaccine (Moderna)  0.25 mL Intramuscular Once  . enoxaparin (LOVENOX) injection  40 mg Subcutaneous Q24H  . feeding supplement (PROSource TF)  45 mL Per Tube BID  . free water  200 mL Per Tube Q2H  . hydrALAZINE  37.5 mg Per Tube Q8H  . insulin aspart  0-15 Units Subcutaneous Q6H  . isosorbide dinitrate  20 mg Per Tube TID  . mouth rinse  15 mL Mouth Rinse BID  . polyvinyl alcohol  1 drop Both Eyes QID  . QUEtiapine  25 mg Per Tube QHS  . sodium chloride flush  3 mL Intravenous Q12H   Continuous Infusions: . feeding supplement (JEVITY 1.5 CAL/FIBER) 780 mL (10/02/20 2206)    Active Problems:   Acute ST elevation myocardial infarction (STEMI) (HCC)   Cardiac arrest (HCC)   Elevated blood pressure reading with diagnosis of hypertension   Shock circulatory (HCC)   Encephalopathy   History of ETT   Cardiomyopathy, ischemic   Anoxic brain damage ([facility])   Dysphagia   DNR (do not resuscitate) discussion   Gait instability   E. coli UTI   Consultants:  Cardiology  Interventional radiology  PCCM  Neurology  Procedures:  Cardiac  catheterization 12/4  Continuous EEG 12/4 and overnight EEG with video 12/5  Echocardiogram 12/5  EEG 12/9  Percutaneous PEG tube placement 12/30  Antibiotics: Anti-infectives (From admission, onward)   Start     Dose/Rate Route Frequency Ordered Stop   09/13/20 1000  sulfamethoxazole-trimethoprim (BACTRIM) 200-40 MG/5ML suspension 20 mL        20 mL Per Tube Every 12 hours 09/13/20 0734 09/19/20 0926   09/13/20 0745  cefTRIAXone (ROCEPHIN) 1 g in sodium chloride 0.9 % 100 mL IVPB  Status:  Discontinued        1 g 200 mL/hr over 30 Minutes Intravenous Every 24 hours 09/13/20 0658 09/13/20 0734   08/17/20 0000  ceFAZolin (ANCEF) IVPB 2g/100 mL premix        2 g 200 mL/hr over 30 Minutes Intravenous To Radiology 08/15/20 1346 08/17/20 1111   07/22/20 0830  cefTRIAXone (ROCEPHIN) 2 g in sodium chloride 0.9 % 100 mL IVPB        2 g 200 mL/hr over 30 Minutes Intravenous Every 24 hours 07/22/20 0720 07/26/20 0858       Time spent: 20 minutes    Erin Hearing ANP  [facility] 7 am - 330 pm/M-F for direct patient care and secure chat Please refer to Amion for contact information 73 days

## 2020-10-04 LAB — GLUCOSE, CAPILLARY
Glucose-Capillary: 123 mg/dL — ABNORMAL HIGH (ref 70–99)
Glucose-Capillary: 155 mg/dL — ABNORMAL HIGH (ref 70–99)
Glucose-Capillary: 160 mg/dL — ABNORMAL HIGH (ref 70–99)
Glucose-Capillary: 176 mg/dL — ABNORMAL HIGH (ref 70–99)

## 2020-10-04 MED ORDER — PANCRELIPASE (LIP-PROT-AMYL) 10440-39150 UNITS PO TABS
20880.0000 [IU] | ORAL_TABLET | Freq: Once | ORAL | Status: DC
  Filled 2020-10-04: qty 2

## 2020-10-04 MED ORDER — HALOPERIDOL LACTATE 5 MG/ML IJ SOLN
1.0000 mg | Freq: Once | INTRAMUSCULAR | Status: DC
  Filled 2020-10-04 (×2): qty 1

## 2020-10-04 MED ORDER — CITALOPRAM HYDROBROMIDE 10 MG PO TABS
10.0000 mg | ORAL_TABLET | Freq: Every day | ORAL | Status: DC
  Administered 2020-10-04 – 2020-10-05 (×2): 10 mg
  Filled 2020-10-04 (×2): qty 1

## 2020-10-04 MED ORDER — SODIUM BICARBONATE 650 MG PO TABS
650.0000 mg | ORAL_TABLET | Freq: Once | ORAL | Status: DC
  Filled 2020-10-04: qty 1

## 2020-10-04 NOTE — Progress Notes (Signed)
CSW spoke with Pamala Hurry of [place] who states she spoke with the patient's family who is still not able to provide 24/7 care - family to return call to [facility] with updated caregiver information if it becomes available.  CSW will continue to seek SNF placement also.  [name], MSW, LCSW Transitions of Care  Clinical Social Worker II [phone]

## 2020-10-04 NOTE — Progress Notes (Signed)
  Speech Language Pathology Treatment: Dysphagia;Cognitive-Linquistic  Patient Details Name: Antonio Navarro MRN: [id number] DOB: 08-22-1960 Today's Date: 10/04/2020 Time: 1735-6701 SLP Time Calculation (min) (ACUTE ONLY): 15 min  Assessment / Plan / Recommendation Clinical Impression  No overt s/s of aspiration were noted with thin liquids and upgraded PO trial of DYS 3 diet. However, pt still demonstrated functional but prolonged mastication with solids, and benefited from Mod verbal cues for sustained attention to food to ensure pt was not holding solids in oral cavity. Recommend that pt remain on DYS 2 diet with thin liquids given s/s of oral dysphagia.   Pt was also seen for cognitive-communicative treatment in which he labeled sandwich and requested more water when given binary choice. Pt also independently labeled water but demonstrated difficulty labeling a tooth brush even when given verbal cues and binary choice. Pt was unable to state who gave him the balloons that were in his room but responded to yes/no questions indicating that they were from his family. Pt followed one-step directions well (e.g. touch your head, touch your nose) but demonstrated difficulty with two-step directions (e.g. touch your nose and then clap your hands) in which he benefited from visual cues. With support from SLP and verbal cues, pt turned his tv back on at the end our session. Will continue to f/u for potential diet upgrade and continued cog/comm tx.    HPI HPI: Pt is a 60 yo male s/p arrest with downtime 9 minutes. Initial rhythm was V. fib, shocked and received 6 rounds of epi before ROSC achieved.  Total CPR time was 30 minutes. ETT 12/4-12/13. MRI 12/7 suggestive of a widespread anoxic injury. PMH includes kidney stones and HTN.      SLP Plan  Continue with current plan of care       Recommendations  Diet recommendations: Dysphagia 2 (fine chop);Thin liquid Liquids provided via:  Cup;Straw Medication Administration: Crushed with puree Supervision: Staff to assist with self feeding;Full supervision/cueing for compensatory strategies Compensations: Slow rate;Small sips/bites;Minimize environmental distractions Postural Changes and/or Swallow Maneuvers: Seated upright 90 degrees                Oral Care Recommendations: Oral care BID Follow up Recommendations: Skilled Nursing facility SLP Visit Diagnosis: Dysphagia, oral phase (R13.11) Plan: Continue with current plan of care       GO               Jeanine Luz., SLP Student 10/04/2020, 4:14 PM

## 2020-10-04 NOTE — Progress Notes (Signed)
Physical Therapy Treatment Patient Details Name: Antonio Navarro MRN: [id number] DOB: [birth year] Today's Date: 10/04/2020    History of Present Illness [name] with history of tobacco abuse, obesity, HTN who was admitted 07/22/20 post cardiac arrest with inferolateral STEMI. He was resuscitated in the field by EMS after being found to be in V fib. Approximately 30 minutes of CPR. He was intubated on arrival.  Pt with anoxic brain injury. PEG placed on 08/17/20.    PT Comments    Pt seen in conjunction with OT in order to take pt outside for the first time in 74 days. Pt requiring two person moderate assist for transfers, two person minimal assist to ambulate 10 ft, then 50 ft with handheld assist. Pt also pedaling wheelchair with BLE's ~100 ft. Of note, pt mildly more resistive to moving this date compared to my prior interactions with him; when questioned, he does say "yes," to feeling sad. Pt denies pain. Will continue to progress as tolerated.     Follow Up Recommendations  SNF;Supervision/Assistance - 24 hour     Equipment Recommendations  None recommended by PT    Recommendations for Other Services       Precautions / Restrictions Precautions Precautions: Fall Precaution Comments: g tube Restrictions Weight Bearing Restrictions: No    Mobility  Bed Mobility Overal bed mobility: Needs Assistance Bed Mobility: Supine to Sit     Supine to sit: Mod assist     General bed mobility comments: Pt bringing LLE off edge of bed, requires assist for initiation of RLE to edge of bed and trunk to upright    Transfers Overall transfer level: Needs assistance Equipment used: 2 person hand held assist   Sit to Stand: Mod assist;+2 physical assistance Stand pivot transfers: Min assist;+2 physical assistance       General transfer comment: ModA + 2 to rise to stand from edge of bed and recliner  Ambulation/Gait Ambulation/Gait assistance: Min assist;+2 physical  assistance Gait Distance (Feet): 60 Feet (10", 50") Assistive device: Rolling walker (2 wheeled) Gait Pattern/deviations: Narrow base of support;Decreased stride length;Step-through pattern Gait velocity: decreased   General Gait Details: MinA + 2 for balance, handheld assist for guidance   Theme park manager mobility: Yes Wheelchair propulsion: Both lower extermities Wheelchair parts: Needs assistance Distance: 100 Wheelchair Assistance Details (indicate cue type and reason): Pt pedaling with BLE's, requires minA for steering  Modified Rankin (Stroke Patients Only)       Balance Overall balance assessment: Needs assistance Sitting-balance support: Feet supported Sitting balance-Leahy Scale: Good     Standing balance support: Bilateral upper extremity supported;During functional activity Standing balance-Leahy Scale: Poor Standing balance comment: requires BUE support during mobility and external assist                            Cognition Arousal/Alertness: Awake/alert Behavior During Therapy: Flat affect Overall Cognitive Status: Impaired/Different from baseline Area of Impairment: Attention;Following commands;Problem solving;Safety/judgement;Awareness                 Orientation Level: Disoriented to;Place;Time;Situation Current Attention Level: Selective Memory: Decreased short-term memory;Decreased recall of precautions Following Commands: Follows one step commands inconsistently;Follows one step commands with increased time Safety/Judgement: Decreased awareness of deficits;Decreased awareness of safety Awareness: Intellectual Problem Solving: Slow processing;Difficulty sequencing;Decreased initiation;Requires verbal cues;Requires tactile cues General Comments: Pt with one word  verbalizations to questions, shaking head yes/no      Exercises      General Comments        Pertinent  Vitals/Pain Pain Assessment: No/denies pain Faces Pain Scale: No hurt Pain Intervention(s): Monitored during session    Home Living                      Prior Function            PT Goals (current goals can now be found in the care plan section) Acute Rehab PT Goals Patient Stated Goal: none stated Potential to Achieve Goals: Fair    Frequency    Min 3X/week      PT Plan Current plan remains appropriate    Co-evaluation PT/OT/SLP Co-Evaluation/Treatment: Yes Reason for Co-Treatment: To address functional/ADL transfers;For patient/therapist safety PT goals addressed during session: Mobility/safety with mobility        AM-PAC PT "6 Clicks" Mobility   Outcome Measure  Help needed turning from your back to your side while in a flat bed without using bedrails?: A Lot Help needed moving from lying on your back to sitting on the side of a flat bed without using bedrails?: A Lot Help needed moving to and from a bed to a chair (including a wheelchair)?: A Lot Help needed standing up from a chair using your arms (e.g., wheelchair or bedside chair)?: A Lot Help needed to walk in hospital room?: A Little Help needed climbing 3-5 steps with a railing? : A Lot 6 Click Score: 13    End of Session Equipment Utilized During Treatment: Gait belt Activity Tolerance: Patient tolerated treatment well Patient left: in chair;with call bell/phone within reach;with chair alarm set Nurse Communication: Mobility status PT Visit Diagnosis: Other abnormalities of gait and mobility (R26.89);Difficulty in walking, not elsewhere classified (R26.2)     Time: 1245-8099 PT Time Calculation (min) (ACUTE ONLY): 37 min  Charges:  $Therapeutic Activity: 8-22 mins                     Antonio Navarro, PT, DPT Acute Rehabilitation Services Pager [phone] Office [phone]    Deno Etienne 10/04/2020, 3:14 PM

## 2020-10-04 NOTE — Progress Notes (Signed)
[facility] PROGRESS NOTE  Antonio Navarro ION:[id number] DOB: 1961/03/20 DOA: 07/22/2020 PCP: Patient, No Pcp Per       Status: Remains inpatient appropriate because:Altered mental status and Unsafe d/c plan   Dispo: The patient is from: Home              Anticipated d/c is to: SNF              Anticipated d/c date is: > 3 days              Patient currently is medically stable to d/c.  Barriers to discharge: Medicaid and disability pending. No SNF bed offers   Code Status: Full Family Communication: LCSW spoke with patient's sister today on 2/15-would like CIR to review pt again DVT prophylaxis: sq heparin Vaccination status: Completed both doses of Pfizer Covid vaccination on 12/14/2019; no option for [employer] booster at this facility therefore Box place to ordered on 09/21/2020 but was not given  Foley catheter: No  HPI: 60 year old male history of hypertension prescribed Norvasc prior to admission, chronic back pain, prior renal calculi who experienced an out of hospital cardiac arrest with downtime of 9 minutes.  Initial rhythm was ventricular fibrillation.  He was defibrillated deceived 6 doses of epi before ROSC.  Subsequent work-up revealed STEMI.  He underwent cardiac catheterization with a DES placed to the circumflex.  Cardiology following and has placed patient on Brilinta and aspirin and is also being treated with beta-blocker and statins.  From a neurological standpoint he clinically has anoxic brain injury.  During this hospitalization he experienced AKI and was treated with fluids and correction of hypoperfusion from cardiac arrest.  Echo performed this admission revealed ischemic cardiomyopathy with an EF of 40%  In addition to above patient has been unable to pass swallowing evaluations and subsequently has undergone percutaneous PEG tube placement on 12/30.  Patient is awake and moving all extremities spontaneously but does not follow commands and does not  verbalize palliative care has met with patient and family and has emphasized poor prognosis and will continue to meet with the family during the hospitalization.  Current plan is to transition to SNF bed once available.  Expect need for lifelong 24/7 care for all ADLs.  Subjective: Morning.  Did not awaken during encounter.  Objective: Vitals:   10/03/20 1947 10/04/20 0706  BP: (!) 151/97 133/75  Pulse: 76 81  Resp: 16 16  Temp: 97.8 F (36.6 C) 97.8 F (36.6 C)  SpO2: 95% 100%   No intake or output data in the 24 hours ending 10/04/20 0812 Filed Weights   10/02/20 0500 10/03/20 0132 10/04/20 0339  Weight: 89.8 kg 86.5 kg 89.8 kg    Exam: Constitutional: Sleeping and in no acute distress Heart: S1-S2, no peripheral edema, pulse regular not tachycardic Respiratory: Anterior lung sounds are clear, stable on room air Abdomen: On D2 diet but not eating well secondary to dislike of textures.  PEG tube for nocturnal tube feedings.  LBM 2/15 Neurologic: CN 2-12 roughly without any appreciable focal neurological deficits, MOE x4, strength is 4/5.   Psychiatric: Sleeping.  At baseline is oriented to name only   Assessment/Plan: Acute problems: Out of hospital cardiac arrest secondary to STEMI -ROSC and subsequent catheterization revealed disease in circumflex requiring DES -Continue beta-blocker, statin, Plavix, isosorbide and hydralazine  Significant anoxic brain injury secondary to cardiac arrest/deconditioning/gait instability -Consistently participating with PT and OT.  SLP has cleared for dysphagia 2 with thin liquids -  Declined by CIR since family unable to provide 24/7 care.  Plan is for SNF -Continue HS Seroquel 25 mg -Continue daytime Symmetrel  **PT NOTES FROM 2/14**  Patient received in bed, shaking head that he does not want to get up and walk. Patient requires multimodal cues to perform activities today. Requires mod/max assist for bed mobility and continually attempting  to lie back down once seated. Patient required +2 max assist to transfer to recliner from bed. He will continue to benefit from skilled PT while here to improve functional independence and safety.    Situational depression -Start Celexa 2/16  Ischemic cardiomyopathy -Echo this admission EF 40% -Continue hydralazine and Isordil -No ACE inhibitor secondary to underlying kidney disease -Will need follow-up echo 3 months post initial echo which was completed on 07/23/2020  Hypertension -Was on Norvasc prior to admission -Continue cardiovascular meds as listed above  Acute on chronic kidney disease stage IIIb -Suspect prerenal etiology in the setting of hypoperfusion from ventricular fibrillation arrest -Baseline creatinine between 1.6 and 2; creatinine on 12/19 was 1.67-will repeat in a.m. on 08/19/2020 -Continue free water per tube and intermittently follow labs  Dysphagia, moderate protein calorie malnutrition Nutrition Problem: Inadequate oral intake Etiology: inability to eat Signs/Symptoms: NPO status  -Currently on nocturnal tube feedings.  Since 2/4 patient has eaten between 60 and 95% of meals.  If this trend continues likely can transition to oral diet only. -1/24 Resumed nocturnal TFs been ongoing poor oral intake during the day -Currently on D2 diet with thin liquids -Repeat SLP eval on 2/8 continues to recommend D2 diet with thin liquids. Interventions: Tube feeding,Prostat Estimated body mass index is 26.85 kg/m as calculated from the following:   Height as of this encounter: 6' (1.829 m).   Weight as of this encounter: 89.8 kg.     Other problems: Acute hypernatremia -Continue free water per tube  E. coli UTI -Has completed 5 days of Bactrim  Hyperglycemia without an underlying diagnosis of diabetes mellitus Resolved -Likely related to acute stress -Recent issues with hypoglycemia so sliding scale insulin decreased to very sensitive and Levemir decreased from 30  twice daily to 15 twice daily as of 1/14 -Hemoglobin A1c 4.7   Acute hypoxemic respiratory failure -Resolved -Stable on room air with O2 sats between 97 and 99%  Nonsustained VT -Continue amiodarone and beta-blocker -1/31 LFTs normal  Skin tear right lateral back Wound / Incision (Open or Dehisced) 07/28/20 Skin tear Back Lateral;Right;Upper (Active)  Date First Assessed/Time First Assessed: 07/28/20 0300   Wound Type: Skin tear  Location: Back  Location Orientation: Lateral;Right;Upper  Present on Admission: No    Assessments 07/28/2020  7:49 PM 09/26/2020  9:35 PM  Dressing Type None Foam - Lift dressing to assess site every shift  Dressing Status -- None  Dressing Change Frequency -- PRN  Site / Wound Assessment -- Clean;Dry  % Wound base Red or Granulating -- 0%  % Wound base Yellow/Fibrinous Exudate -- 0%  % Wound base Black/Eschar -- 0%  % Wound base Other/Granulation Tissue (Comment) -- 0%  Peri-wound Assessment -- Intact  Wound Length (cm) -- 0 cm  Wound Width (cm) -- 0 cm  Wound Depth (cm) -- 0 cm  Wound Volume (cm^3) -- 0 cm^3  Wound Surface Area (cm^2) -- 0 cm^2  Margins -- Attached edges (approximated)  Closure -- None  Drainage Amount None None  Drainage Description -- No odor  Non-staged Wound Description -- Not applicable  Treatment Cleansed Cleansed  No Linked orders to display    Data Reviewed: Basic Metabolic Panel: Recent Labs  Lab 09/30/20 0938  NA 139  K 3.9  CL 102  CO2 27  GLUCOSE 166*  BUN 15  CREATININE 1.26*  CALCIUM 9.4  MG 1.7   Liver Function Tests: No results for input(s): AST, ALT, ALKPHOS, BILITOT, PROT, ALBUMIN in the last 168 hours. No results for input(s): LIPASE, AMYLASE in the last 168 hours. No results for input(s): AMMONIA in the last 168 hours. CBC: Recent Labs  Lab 09/30/20 0938  WBC 6.6  HGB 11.8*  HCT 36.6*  MCV 98.1  PLT 199   Cardiac Enzymes: No results for input(s): CKTOTAL, CKMB, CKMBINDEX, TROPONINI  in the last 168 hours. BNP (last 3 results) No results for input(s): BNP in the last 8760 hours.  ProBNP (last 3 results) No results for input(s): PROBNP in the last 8760 hours.  CBG: Recent Labs  Lab 10/03/20 0651 10/03/20 1155 10/03/20 1559 10/04/20 0017 10/04/20 0639  GLUCAP 164* 193* 100* 176* 155*    No results found for this or any previous visit (from the past 240 hour(s)).   Studies: No results found.  Scheduled Meds: . amantadine  100 mg Per Tube Daily  . aspirin  81 mg Per Tube Daily  . atorvastatin  80 mg Per Tube Daily  . bethanechol  10 mg Per Tube TID  . carvedilol  25 mg Per Tube BID WC  . chlorhexidine gluconate (MEDLINE KIT)  15 mL Mouth Rinse BID  . citalopram  10 mg Per Tube Daily  . clopidogrel  75 mg Per Tube Daily  . COVID-19 mRNA vaccine (Moderna)  0.25 mL Intramuscular Once  . enoxaparin (LOVENOX) injection  40 mg Subcutaneous Q24H  . feeding supplement (PROSource TF)  45 mL Per Tube BID  . free water  200 mL Per Tube Q2H  . hydrALAZINE  37.5 mg Per Tube Q8H  . insulin aspart  0-15 Units Subcutaneous Q6H  . isosorbide dinitrate  20 mg Per Tube TID  . mouth rinse  15 mL Mouth Rinse BID  . polyvinyl alcohol  1 drop Both Eyes QID  . QUEtiapine  25 mg Per Tube QHS  . sodium chloride flush  3 mL Intravenous Q12H   Continuous Infusions: . feeding supplement (JEVITY 1.5 CAL/FIBER) 780 mL (10/03/20 2157)    Active Problems:   Acute ST elevation myocardial infarction (STEMI) [facility])   Cardiac arrest (HCC)   Elevated blood pressure reading with diagnosis of hypertension   Shock circulatory (HCC)   Encephalopathy   History of ETT   Cardiomyopathy, ischemic   Anoxic brain damage ([place])   Dysphagia   DNR (do not resuscitate) discussion   Gait instability   E. coli UTI   Consultants:  Cardiology  Interventional radiology  PCCM  Neurology  Procedures:  Cardiac catheterization 12/4  Continuous EEG 12/4 and overnight EEG with video  12/5  Echocardiogram 12/5  EEG 12/9  Percutaneous PEG tube placement 12/30  Antibiotics: Anti-infectives (From admission, onward)   Start     Dose/Rate Route Frequency Ordered Stop   09/13/20 1000  sulfamethoxazole-trimethoprim (BACTRIM) 200-40 MG/5ML suspension 20 mL        20 mL Per Tube Every 12 hours 09/13/20 0734 09/19/20 0926   09/13/20 0745  cefTRIAXone (ROCEPHIN) 1 g in sodium chloride 0.9 % 100 mL IVPB  Status:  Discontinued        1 g 200 mL/hr over 30 Minutes Intravenous  Every 24 hours 09/13/20 0658 09/13/20 0734   08/17/20 0000  ceFAZolin (ANCEF) IVPB 2g/100 mL premix        2 g 200 mL/hr over 30 Minutes Intravenous To Radiology 08/15/20 1346 08/17/20 1111   07/22/20 0830  cefTRIAXone (ROCEPHIN) 2 g in sodium chloride 0.9 % 100 mL IVPB        2 g 200 mL/hr over 30 Minutes Intravenous Every 24 hours 07/22/20 0720 07/26/20 0858       Time spent: 20 minutes    Erin Hearing ANP  [facility] 7 am - 330 pm/M-F for direct patient care and secure chat Please refer to Amion for contact information 74 days

## 2020-10-04 NOTE — Progress Notes (Signed)
Occupational Therapy Treatment Patient Details Name: Antonio Navarro MRN: [id number] DOB: [birth year] Today's Date: 10/04/2020    History of present illness [name] with history of tobacco abuse, obesity, HTN who was admitted 07/22/20 post cardiac arrest with inferolateral STEMI. He was resuscitated in the field by EMS after being found to be in V fib. Approximately 30 minutes of CPR. He was intubated on arrival.  Pt with anoxic brain injury. PEG placed on 08/17/20.   OT comments  Patient seen this date for ADL task in prep for PT taking patient outside.  Unfortunately he was a little more resisitve this date.  He was not angry, just not his usual self.  Normally he needs verbal and tactile cues for basic mobility, but today he was closer to Mod A of 2.  OT will continue to see him in the acute care level, hoping he can advance enough to transition to SNF for continued, more intensive rehab.    Follow Up Recommendations  SNF;Supervision/Assistance - 24 hour    Equipment Recommendations  Wheelchair (measurements OT);Wheelchair cushion (measurements OT);Hospital bed;3 in 1 bedside commode    Recommendations for Other Services      Precautions / Restrictions Precautions Precautions: Fall Precaution Comments: gtube, lap belt chair alarm Restrictions Weight Bearing Restrictions: No       Mobility Bed Mobility         Supine to sit: Mod assist;HOB elevated;Min assist        Transfers Overall transfer level: Needs assistance Equipment used: 2 person hand held assist   Sit to Stand: +2 physical assistance;Mod assist;Min assist Stand pivot transfers: Min assist;+2 physical assistance                                 Cognition    No changes from prior session.                                                                  Pertinent Vitals/ Pain       Pain Assessment: Faces Faces Pain Scale: No hurt Pain Intervention(s):  Monitored during session                                                          Frequency  Min 2X/week        Progress Toward Goals  OT Goals(current goals can now be found in the care plan section)  Progress towards OT goals: Progressing toward goals  Acute Rehab OT Goals Patient Stated Goal: none stated OT Goal Formulation: Patient unable to participate in goal setting Time For Goal Achievement: 10/12/20 Potential to Achieve Goals: [place] Discharge plan remains appropriate;Frequency remains appropriate    Co-evaluation                 AM-PAC OT "6 Clicks" Daily Activity     Outcome Measure   Help from another person eating meals?: A Lot Help from another person taking care of personal grooming?: A Lot Help from another  person toileting, which includes using toliet, bedpan, or urinal?: A Lot Help from another person bathing (including washing, rinsing, drying)?: A Lot Help from another person to put on and taking off regular upper body clothing?: A Little Help from another person to put on and taking off regular lower body clothing?: A Lot 6 Click Score: 13    End of Session Equipment Utilized During Treatment: Gait belt  OT Visit Diagnosis: Muscle weakness (generalized) (M62.81);Pain;Other abnormalities of gait and mobility (R26.89);Cognitive communication deficit (R41.841);Low vision, both eyes (H54.2) Symptoms and signs involving cognitive functions: Other Nontraumatic ICH   Activity Tolerance Patient tolerated treatment well   Patient Left in chair;with call bell/phone within reach;with chair alarm set   Nurse Communication Other (comment)        Time: 1400-1420 OT Time Calculation (min): 20 min  Charges: OT General Charges $OT Visit: 1 Visit OT Treatments $Self Care/Home Management : 8-22 mins  10/04/2020  Rich, OTR/L  Acute Rehabilitation Services  Office:  [place] 10/04/2020,  2:55 PM

## 2020-10-04 NOTE — Progress Notes (Signed)
Patient agitated, wandering and stating that he's looking for a "way out" and that he "wants to go home". See new orders. Patient able to turn self.

## 2020-10-05 ENCOUNTER — Inpatient Hospital Stay (HOSPITAL_COMMUNITY): Payer: Medicaid Other

## 2020-10-05 LAB — CBC WITH DIFFERENTIAL/PLATELET
Abs Immature Granulocytes: 0.02 10*3/uL (ref 0.00–0.07)
Basophils Absolute: 0.1 10*3/uL (ref 0.0–0.1)
Basophils Relative: 1 %
Eosinophils Absolute: 0.2 10*3/uL (ref 0.0–0.5)
Eosinophils Relative: 4 %
HCT: 35.8 % — ABNORMAL LOW (ref 39.0–52.0)
Hemoglobin: 12 g/dL — ABNORMAL LOW (ref 13.0–17.0)
Immature Granulocytes: 0 %
Lymphocytes Relative: 32 %
Lymphs Abs: 2 10*3/uL (ref 0.7–4.0)
MCH: 32.2 pg (ref 26.0–34.0)
MCHC: 33.5 g/dL (ref 30.0–36.0)
MCV: 96 fL (ref 80.0–100.0)
Monocytes Absolute: 0.7 10*3/uL (ref 0.1–1.0)
Monocytes Relative: 12 %
Neutro Abs: 3.1 10*3/uL (ref 1.7–7.7)
Neutrophils Relative %: 51 %
Platelets: 191 10*3/uL (ref 150–400)
RBC: 3.73 MIL/uL — ABNORMAL LOW (ref 4.22–5.81)
RDW: 13.4 % (ref 11.5–15.5)
WBC: 6.1 10*3/uL (ref 4.0–10.5)
nRBC: 0 % (ref 0.0–0.2)

## 2020-10-05 LAB — GLUCOSE, CAPILLARY
Glucose-Capillary: 169 mg/dL — ABNORMAL HIGH (ref 70–99)
Glucose-Capillary: 143 mg/dL — ABNORMAL HIGH (ref 70–99)
Glucose-Capillary: 147 mg/dL — ABNORMAL HIGH (ref 70–99)

## 2020-10-05 LAB — COMPREHENSIVE METABOLIC PANEL
ALT: 33 U/L (ref 0–44)
AST: 21 U/L (ref 15–41)
Albumin: 3.1 g/dL — ABNORMAL LOW (ref 3.5–5.0)
Alkaline Phosphatase: 71 U/L (ref 38–126)
Anion gap: 9 (ref 5–15)
BUN: 14 mg/dL (ref 6–20)
CO2: 25 mmol/L (ref 22–32)
Calcium: 9.1 mg/dL (ref 8.9–10.3)
Chloride: 104 mmol/L (ref 98–111)
Creatinine, Ser: 1.25 mg/dL — ABNORMAL HIGH (ref 0.61–1.24)
GFR, Estimated: >60 mL/min (ref >60)
Glucose, Bld: 133 mg/dL — ABNORMAL HIGH (ref 70–99)
Potassium: 4 mmol/L (ref 3.5–5.1)
Sodium: 138 mmol/L (ref 135–145)
Total Bilirubin: 0.7 mg/dL (ref 0.3–1.2)
Total Protein: 6.7 g/dL (ref 6.5–8.1)

## 2020-10-05 LAB — URINALYSIS, ROUTINE W REFLEX MICROSCOPIC
Bilirubin Urine: NEGATIVE
Glucose, UA: NEGATIVE mg/dL
Hgb urine dipstick: NEGATIVE
Ketones, ur: NEGATIVE mg/dL
Leukocytes,Ua: NEGATIVE
Nitrite: NEGATIVE
Protein, ur: NEGATIVE mg/dL
Specific Gravity, Urine: 1.008 (ref 1.005–1.030)
pH: 6 (ref 5.0–8.0)

## 2020-10-05 IMAGING — DX DG CHEST 1V PORT
1 series · 1 of 1 positions shown · non-contrast
Comparison: Chest x-ray dated [DATE].

CLINICAL DATA: Fever.

EXAM:
PORTABLE CHEST 1 VIEW

[chest]
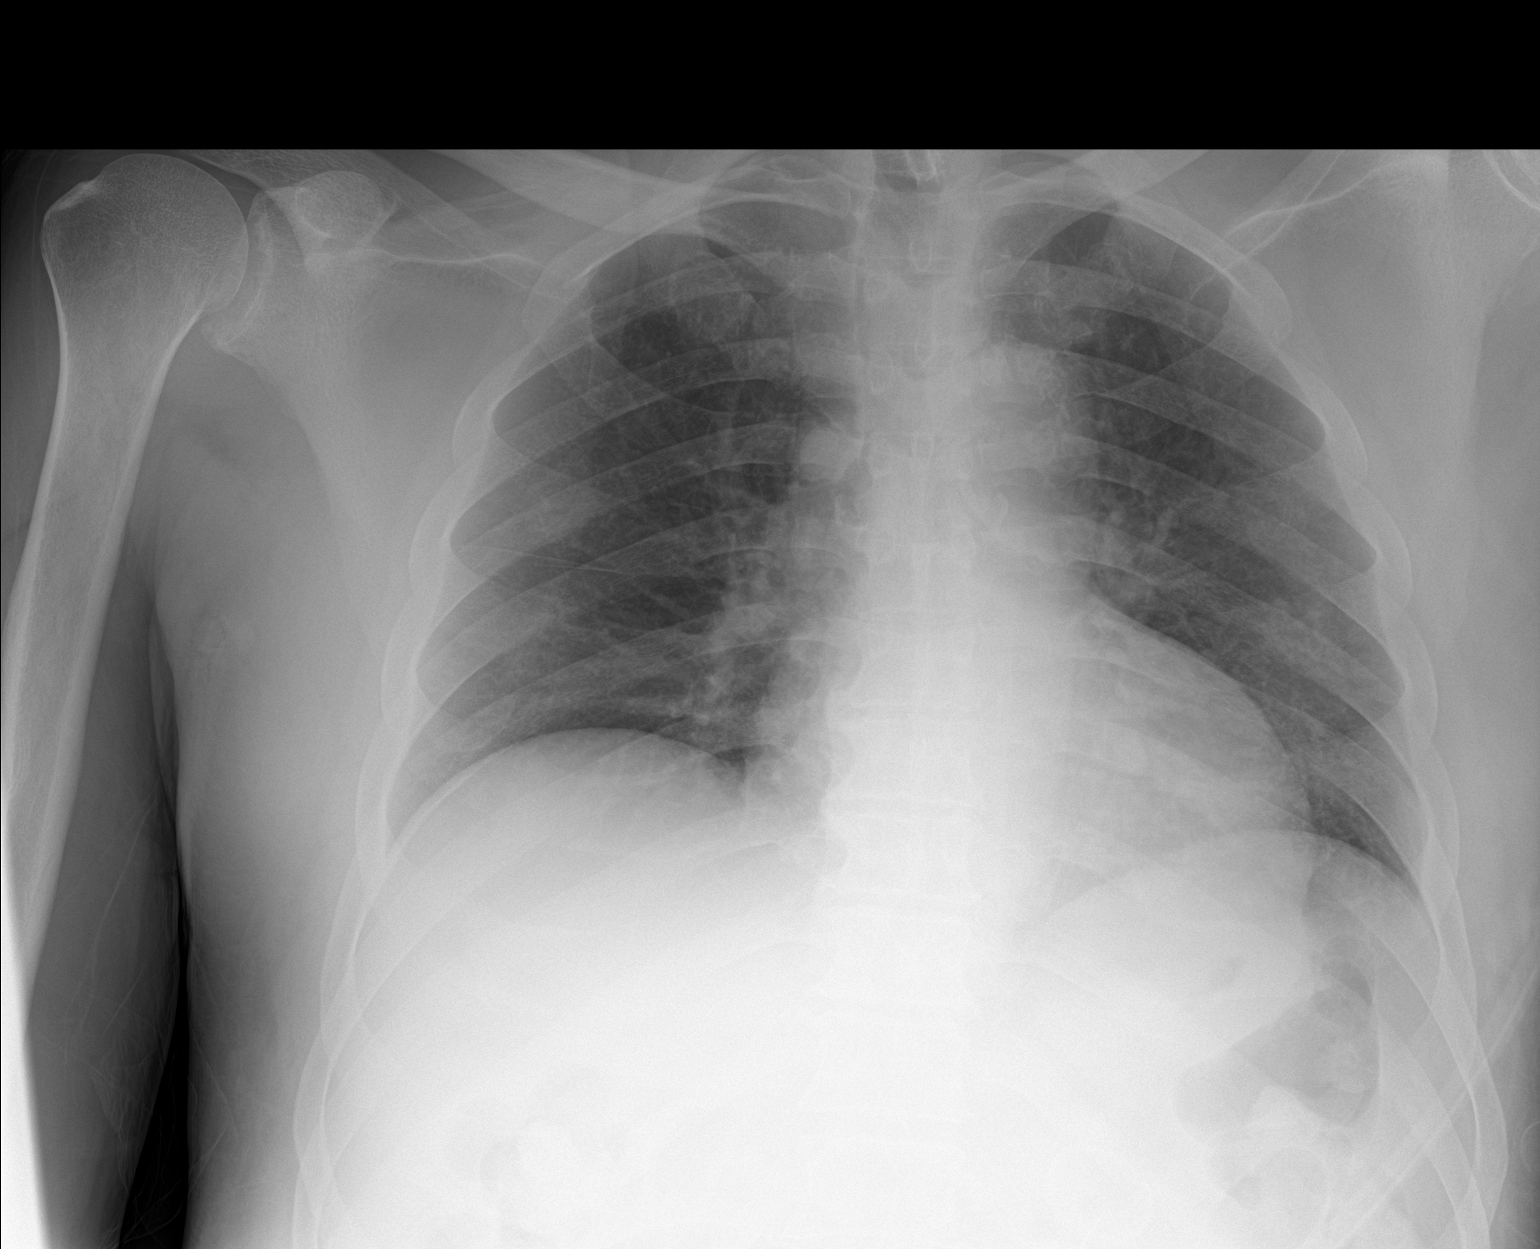

[1 of 1 positions shown; findings below may reference images not displayed]

FINDINGS: The heart size and mediastinal contours are within normal limits.
Both lungs are clear. The visualized skeletal structures are
unremarkable.
IMPRESSION: No active disease.

## 2020-10-05 MED ORDER — LORAZEPAM 2 MG/ML IJ SOLN
1.0000 mg | Freq: Once | INTRAMUSCULAR | Status: DC
  Filled 2020-10-05: qty 1

## 2020-10-05 NOTE — Progress Notes (Signed)
Inpatient Rehabilitation Admissions Coordinator  We will sign off at this time.Not a candidate for CIR admit unless 24/7 caregiver support can be arranged post CIR d/c. Please re consult if this becomes available.  Danne Baxter, RN, MSN Rehab Admissions Coordinator [phone] 10/05/2020 1:05 PM

## 2020-10-05 NOTE — Progress Notes (Signed)
[facility] PROGRESS NOTE  Antonio Navarro JJH:[id number] DOB: 09/04/1960 DOA: 07/22/2020 PCP: Patient, No Pcp Per       Status: Remains inpatient appropriate because:Altered mental status and Unsafe d/c plan   Dispo: The patient is from: Home              Anticipated d/c is to: SNF              Anticipated d/c date is: > 3 days              Patient currently is medically stable to d/c.  Barriers to discharge: Medicaid and disability pending. No SNF bed offers   Code Status: Full Family Communication: LCSW spoke with patient's sister today on 2/15-would like CIR to review pt again DVT prophylaxis: sq heparin Vaccination status: Completed both doses of Pfizer Covid vaccination on 12/14/2019; no option for [employer] booster at this facility therefore [place] [facility] to ordered on 09/21/2020 but was not given  Foley catheter: No  HPI: 60 year old male history of hypertension prescribed Norvasc prior to admission, chronic back pain, prior renal calculi who experienced an out of hospital cardiac arrest with downtime of 9 minutes.  Initial rhythm was ventricular fibrillation.  He was defibrillated deceived 6 doses of epi before ROSC.  Subsequent work-up revealed STEMI.  He underwent cardiac catheterization with a DES placed to the circumflex.  Cardiology following and has placed patient on Brilinta and aspirin and is also being treated with beta-blocker and statins.  From a neurological standpoint he clinically has anoxic brain injury.  During this hospitalization he experienced AKI and was treated with fluids and correction of hypoperfusion from cardiac arrest.  Echo performed this admission revealed ischemic cardiomyopathy with an EF of 40%  In addition to above patient has been unable to pass swallowing evaluations and subsequently has undergone percutaneous PEG tube placement on 12/30.  Patient is awake and moving all extremities spontaneously but does not follow commands and does not  verbalize palliative care has met with patient and family and has emphasized poor prognosis and will continue to meet with the family during the hospitalization.  Current plan is to transition to SNF bed once available.  Expect need for lifelong 24/7 care for all ADLs.  Subjective: Patient slightly more lethargic today than his baseline a.m. sleepiness.  Skin felt hot but upon checking patient was not febrile.  Patient has been up during the night with agitation and restlessness.  Reported he felt depressed to prior providers and was trying to get home last night.  Objective: Vitals:   10/04/20 2007 10/05/20 0636  BP: (!) 150/107 122/78  Pulse: 79 92  Resp: 19 19  Temp: 98.7 F (37.1 C) 97.8 F (36.6 C)  SpO2: 100% 99%    Intake/Output Summary (Last 24 hours) at 10/05/2020 0751 Last data filed at 10/05/2020 0600 Gross per 24 hour  Intake 1440 ml  Output 300 ml  Net 1140 ml   Filed Weights   10/03/20 0132 10/04/20 0339 10/05/20 0056  Weight: 86.5 kg 89.8 kg 87 kg    Exam: Constitutional: Sleepy but overall calm although when stimulated became a little restless Heart: Normal heart sounds, extremities without edema, no tachycardia, adequate capillary refill Respiratory: Lung sounds clear to auscultation and he is stable on room air Abdomen: On D2 diet -failed trial of D3 on 2/16 -abdomen appears slightly distended but does not appear to be having any pain upon palpation, bowel sounds are auscultated.  LBM 2/16  Neurologic: CN 2-12 roughly without any appreciable focal neurological deficits, MOE x4, strength is 4/5.   Psychiatric: Sleepy.  Did not attempt to verbally communicate today.  Flat affect   Assessment/Plan: Acute problems: Out of hospital cardiac arrest secondary to STEMI -ROSC and subsequent catheterization revealed disease in circumflex requiring DES -Continue beta-blocker, statin, Plavix, isosorbide and hydralazine  Significant anoxic brain injury secondary to  cardiac arrest/deconditioning/gait instability -Consistently participating with PT and OT.  SLP has cleared for dysphagia 2 with thin liquids -Declined by CIR since family unable to provide 24/7 care.  Plan is for SNF -Continue HS Seroquel 25 mg -Continue daytime Symmetrel  **PT NOTES FROM 2/14**  Patient received in bed, shaking head that he does not want to get up and walk. Patient requires multimodal cues to perform activities today. Requires mod/max assist for bed mobility and continually attempting to lie back down once seated. Patient required +2 max assist to transfer to recliner from bed. He will continue to benefit from skilled PT while here to improve functional independence and safety.   ?  Acute metabolic encephalopathy -Patient typically less responsive in the mornings but seems to be more so over the past 2 days.  Skin felt hot but patient was afebrile -Labs including urinalysis unremarkable -Chest x-ray unremarkable -Abdomen slightly distended so bladder scan ordered as was single view abdominal x-ray  Situational depression -Start Celexa 2/16  Ischemic cardiomyopathy -Echo this admission EF 40% -Continue hydralazine and Isordil -No ACE inhibitor secondary to underlying kidney disease -Will need follow-up echo 3 months post initial echo which was completed on 07/23/2020  Hypertension -Was on Norvasc prior to admission -Continue cardiovascular meds as listed above  Acute on chronic kidney disease stage IIIb -Suspect prerenal etiology in the setting of hypoperfusion from ventricular fibrillation arrest -Baseline creatinine between 1.6 and 2; creatinine on 12/19 was 1.67-will repeat in a.m. on 08/19/2020 -Continue free water per tube and intermittently follow labs  Dysphagia, moderate protein calorie malnutrition Nutrition Problem: Inadequate oral intake Etiology: inability to eat Signs/Symptoms: NPO status  -Currently on nocturnal tube feedings.  Since 2/4 patient has  eaten between 60 and 95% of meals.  If this trend continues likely can transition to oral diet only. -1/24 Resumed nocturnal TFs been ongoing poor oral intake during the day -Currently on D2 diet with thin liquids -Repeat SLP eval on 2/8 continues to recommend D2 diet with thin liquids. Interventions: Tube feeding,Prostat Estimated body mass index is 26.01 kg/m as calculated from the following:   Height as of this encounter: 6' (1.829 m).   Weight as of this encounter: 87 kg.     Other problems: Acute hypernatremia -Continue free water per tube  E. coli UTI -Has completed 5 days of Bactrim  Hyperglycemia without an underlying diagnosis of diabetes mellitus Resolved -Likely related to acute stress -Recent issues with hypoglycemia so sliding scale insulin decreased to very sensitive and Levemir decreased from 30 twice daily to 15 twice daily as of 1/14 -Hemoglobin A1c 4.7   Acute hypoxemic respiratory failure -Resolved -Stable on room air with O2 sats between 97 and 99%  Nonsustained VT -Continue amiodarone and beta-blocker -1/31 LFTs normal  Skin tear right lateral back Wound / Incision (Open or Dehisced) 07/28/20 Skin tear Back Lateral;Right;Upper (Active)  Date First Assessed/Time First Assessed: 07/28/20 0300   Wound Type: Skin tear  Location: Back  Location Orientation: Lateral;Right;Upper  Present on Admission: No    Assessments 07/28/2020  7:49 PM 09/26/2020  9:35 PM  Dressing Type None Foam - Lift dressing to assess site every shift  Dressing Status -- None  Dressing Change Frequency -- PRN  Site / Wound Assessment -- Clean;Dry  % Wound base Red or Granulating -- 0%  % Wound base Yellow/Fibrinous Exudate -- 0%  % Wound base Black/Eschar -- 0%  % Wound base Other/Granulation Tissue (Comment) -- 0%  Peri-wound Assessment -- Intact  Wound Length (cm) -- 0 cm  Wound Width (cm) -- 0 cm  Wound Depth (cm) -- 0 cm  Wound Volume (cm^3) -- 0 cm^3  Wound Surface Area  (cm^2) -- 0 cm^2  Margins -- Attached edges (approximated)  Closure -- None  Drainage Amount None None  Drainage Description -- No odor  Non-staged Wound Description -- Not applicable  Treatment Cleansed Cleansed     No Linked orders to display    Data Reviewed: Basic Metabolic Panel: Recent Labs  Lab 09/30/20 0938  NA 139  K 3.9  CL 102  CO2 27  GLUCOSE 166*  BUN 15  CREATININE 1.26*  CALCIUM 9.4  MG 1.7   Liver Function Tests: No results for input(s): AST, ALT, ALKPHOS, BILITOT, PROT, ALBUMIN in the last 168 hours. No results for input(s): LIPASE, AMYLASE in the last 168 hours. No results for input(s): AMMONIA in the last 168 hours. CBC: Recent Labs  Lab 09/30/20 0938  WBC 6.6  HGB 11.8*  HCT 36.6*  MCV 98.1  PLT 199   Cardiac Enzymes: No results for input(s): CKTOTAL, CKMB, CKMBINDEX, TROPONINI in the last 168 hours. BNP (last 3 results) No results for input(s): BNP in the last 8760 hours.  ProBNP (last 3 results) No results for input(s): PROBNP in the last 8760 hours.  CBG: Recent Labs  Lab 10/04/20 0017 10/04/20 0639 10/04/20 1124 10/04/20 1720 10/05/20 0633  GLUCAP 176* 155* 160* 123* 169*    No results found for this or any previous visit (from the past 240 hour(s)).   Studies: No results found.  Scheduled Meds: . amantadine  100 mg Per Tube Daily  . aspirin  81 mg Per Tube Daily  . atorvastatin  80 mg Per Tube Daily  . bethanechol  10 mg Per Tube TID  . carvedilol  25 mg Per Tube BID WC  . chlorhexidine gluconate (MEDLINE KIT)  15 mL Mouth Rinse BID  . citalopram  10 mg Per Tube Daily  . clopidogrel  75 mg Per Tube Daily  . COVID-19 mRNA vaccine (Moderna)  0.25 mL Intramuscular Once  . enoxaparin (LOVENOX) injection  40 mg Subcutaneous Q24H  . feeding supplement (PROSource TF)  45 mL Per Tube BID  . free water  200 mL Per Tube Q2H  . haloperidol lactate  1 mg Intravenous Once  . hydrALAZINE  37.5 mg Per Tube Q8H  . insulin aspart   0-15 Units Subcutaneous Q6H  . isosorbide dinitrate  20 mg Per Tube TID  . lipase/protease/amylase)  20,880 Units Per Tube Once   And  . sodium bicarbonate  650 mg Per Tube Once  . mouth rinse  15 mL Mouth Rinse BID  . polyvinyl alcohol  1 drop Both Eyes QID  . QUEtiapine  25 mg Per Tube QHS  . sodium chloride flush  3 mL Intravenous Q12H   Continuous Infusions: . feeding supplement (JEVITY 1.5 CAL/FIBER) 780 mL (10/05/20 0020)    Active Problems:   Acute ST elevation myocardial infarction (STEMI) ([place])   Cardiac arrest ([facility])   Elevated blood pressure  reading with diagnosis of hypertension   Shock circulatory ([place])   Encephalopathy   History of ETT   Cardiomyopathy, ischemic   Anoxic brain damage ([place])   Dysphagia   DNR (do not resuscitate) discussion   Gait instability   E. coli UTI   Consultants:  Cardiology  Interventional radiology  PCCM  Neurology  Procedures:  Cardiac catheterization 12/4  Continuous EEG 12/4 and overnight EEG with video 12/5  Echocardiogram 12/5  EEG 12/9  Percutaneous PEG tube placement 12/30  Antibiotics: Anti-infectives (From admission, onward)   Start     Dose/Rate Route Frequency Ordered Stop   09/13/20 1000  sulfamethoxazole-trimethoprim (BACTRIM) 200-40 MG/5ML suspension 20 mL        20 mL Per Tube Every 12 hours 09/13/20 0734 09/19/20 0926   09/13/20 0745  cefTRIAXone (ROCEPHIN) 1 g in sodium chloride 0.9 % 100 mL IVPB  Status:  Discontinued        1 g 200 mL/hr over 30 Minutes Intravenous Every 24 hours 09/13/20 0658 09/13/20 0734   08/17/20 0000  ceFAZolin (ANCEF) IVPB 2g/100 mL premix        2 g 200 mL/hr over 30 Minutes Intravenous To Radiology 08/15/20 1346 08/17/20 1111   07/22/20 0830  cefTRIAXone (ROCEPHIN) 2 g in sodium chloride 0.9 % 100 mL IVPB        2 g 200 mL/hr over 30 Minutes Intravenous Every 24 hours 07/22/20 0720 07/26/20 0858       Time spent: 20 minutes    Erin Hearing ANP  [facility] 7 am - 330 pm/M-F for direct patient care and secure chat Please refer to Amion for contact information 75 days

## 2020-10-05 NOTE — Progress Notes (Signed)
Nutrition Follow-up  DOCUMENTATION CODES:   Not applicable  INTERVENTION:   -ContinueMagic cup TID with meals, each supplement provides 290 kcal and 9 grams of protein -Continue feeding assistance with meals -Continuenocturnal feedings:  Jevity 1.5@ 60ml/hr via PEG over 12 hour period  20ml Prosource TF BID   Continue 200 ml free water flush every 3 hours  Tube feeding regimen provides1250kcal (54% of needs),72grams of protein, and 576ml of H2O. Total free water: 2193 ml daily  NUTRITION DIAGNOSIS:   Inadequate oral intake related to inability to eat as evidenced by NPO status.  Ongoing; advanced to a dysphagia 2 diet on 08/29/20  GOAL:   Patient will meet greater than or equal to 90% of their needs  Progressing   MONITOR:   Diet advancement,Labs,Weight trends,TF tolerance,Skin,I & O's  REASON FOR ASSESSMENT:   Consult,Ventilator Enteral/tube feeding initiation and management  ASSESSMENT:   Patient with PMH significant for HTN and kidney stones. Presents this admission s/p cardiac arrest.  12/13- NGT d/c 12/15- cortrak placed, tip of tube in the stomach 12/17- s/p BSE- recommend continue NPO 12/19- pt pulled cortrak tube, replaced- placement verified by x-ray (stomach) 12/20- s/p BSE- pt refusing PO trials 12/30- cortrak removed, PEG placed 1/5- Advanced to thin liquid diet 1/6- Advanced to full liquid diet 1/11- Advanced to dysphagia 2 diet 1/14- transitioned to nocturnal feedings by MD 1/18- TF d/c by MD 1/24- calorie cont completed- pt consuming about 50% of needs PO; nocturnal feedings resumed  Reviewed I/O's: +1.1 L x 24 hours and +11 L since 09/21/20  UOP: 300 ml x 24 hours  Pt unavailable at time of visit.   Pt remains with erratic intake; noted meal completion 10-100%. Nocturnal feedings resumed on 09/11/20; pt tolerating well.   Reviewed wt hx; wt has been stable over the past month.   Per chart review, pt continues to await  SNF placement. He is medically stable for discharge.  Medications reviewed and include viokace.  Lab Results  Component Value Date   HGBA1C 4.7 (L) 07/29/2020   PTA DM medications are .   Labs reviewed: CBGS: 123-176 (inpatient orders for glycemic control are 0-15 units insulin aspart every 6 hours).   Diet Order:   Diet Order            DIET DYS 2 Room service appropriate? No; Fluid consistency: Thin  Diet effective now                 EDUCATION NEEDS:   Not appropriate for education at this time  Skin:  Skin Assessment: Skin Integrity Issues: Skin Integrity Issues:: Other (Comment) Other: MASD to buttocks, skin tear to rt upper back  Last BM:  10/04/20  Height:   Ht Readings from Last 1 Encounters:  09/03/20 6' (1.829 m)    Weight:   Wt Readings from Last 1 Encounters:  10/05/20 87 kg    Ideal Body Weight:  80.9 kg  BMI:  Body mass index is 26.01 kg/m.  Estimated Nutritional Needs:   Kcal:  2300-2500 kcal  Protein:  115-130 grams  Fluid:  >/= 2 L/day    Loistine Chance, RD, LDN, [place] Registered Dietitian II Certified Diabetes Care and Education Specialist Please refer to [facility] for RD and/or RD on-call/weekend/after hours pager

## 2020-10-05 NOTE — Progress Notes (Signed)
Patient constantly trying to "go home", trying to wander in hallway, gait is unsteady, and patient is pulling at gtube. MD paged for orders.

## 2020-10-06 LAB — GLUCOSE, CAPILLARY
Glucose-Capillary: 107 mg/dL — ABNORMAL HIGH (ref 70–99)
Glucose-Capillary: 117 mg/dL — ABNORMAL HIGH (ref 70–99)
Glucose-Capillary: 122 mg/dL — ABNORMAL HIGH (ref 70–99)
Glucose-Capillary: 126 mg/dL — ABNORMAL HIGH (ref 70–99)
Glucose-Capillary: 148 mg/dL — ABNORMAL HIGH (ref 70–99)

## 2020-10-06 LAB — URINE CULTURE: Culture: NO GROWTH

## 2020-10-06 MED ORDER — QUETIAPINE FUMARATE 50 MG PO TABS: 50.0000 mg | ORAL_TABLET | Freq: Every day | ORAL | Status: DC

## 2020-10-06 MED ORDER — QUETIAPINE FUMARATE 25 MG PO TABS
25.0000 mg | ORAL_TABLET | Freq: Two times a day (BID) | ORAL | Status: DC | PRN
  Administered 2020-10-06: 25 mg via ORAL

## 2020-10-06 MED ORDER — QUETIAPINE FUMARATE 50 MG PO TABS
50.0000 mg | ORAL_TABLET | ORAL | Status: DC
  Administered 2020-10-06 – 2020-10-08 (×3): 50 mg
  Filled 2020-10-06 (×5): qty 1

## 2020-10-06 MED ORDER — HALOPERIDOL LACTATE 5 MG/ML IJ SOLN
2.0000 mg | Freq: Every day | INTRAMUSCULAR | Status: DC | PRN
  Administered 2020-10-06: 2 mg via INTRAMUSCULAR
  Filled 2020-10-06: qty 1

## 2020-10-06 MED ORDER — CITALOPRAM HYDROBROMIDE 20 MG PO TABS
20.0000 mg | ORAL_TABLET | Freq: Every day | ORAL | Status: DC
  Administered 2020-10-06 – 2020-10-18 (×13): 20 mg
  Filled 2020-10-06 (×13): qty 1

## 2020-10-06 MED ORDER — HALOPERIDOL LACTATE 5 MG/ML IJ SOLN: 1.0000 mg | Freq: Four times a day (QID) | INTRAMUSCULAR | Status: DC | PRN

## 2020-10-06 NOTE — Progress Notes (Signed)
Restraints removed, patient resting.

## 2020-10-06 NOTE — Progress Notes (Addendum)
[facility] PROGRESS NOTE  Antonio Navarro MWU:[id number] DOB: Jan 14, 1961 DOA: 07/22/2020 PCP: Patient, No Pcp Per       Status: Remains inpatient appropriate because:Altered mental status and Unsafe d/c plan   Dispo: The patient is from: Home              Anticipated d/c is to: SNF              Anticipated d/c date is: > 3 days              Patient currently is medically stable to d/c.  Barriers to discharge: Medicaid and disability pending. No SNF bed offers.  As of 2/18 patient has had increasing nocturnal agitation with wandering and has required as needed medications as well as safety sitter-hopefully with adjustment in his evening Seroquel these behaviors will resolve   Code Status: Full Family Communication: LCSW spoke with patient's sister today on 2/15-would like CIR to review pt again DVT prophylaxis: sq heparin Vaccination status: Completed both doses of [place] Covid vaccination on 12/14/2019; no option for [employer] booster at this facility therefore [place] place to ordered on 09/21/2020 but was not given  Foley catheter: No  HPI: 60 year old male history of hypertension prescribed Norvasc prior to admission, chronic back pain, prior renal calculi who experienced an out of hospital cardiac arrest with downtime of 9 minutes.  Initial rhythm was ventricular fibrillation.  He was defibrillated deceived 6 doses of epi before ROSC.  Subsequent work-up revealed STEMI.  He underwent cardiac catheterization with a DES placed to the circumflex.  Cardiology following and has placed patient on Brilinta and aspirin and is also being treated with beta-blocker and statins.  From a neurological standpoint he clinically has anoxic brain injury.  During this hospitalization he experienced AKI and was treated with fluids and correction of hypoperfusion from cardiac arrest.  Echo performed this admission revealed ischemic cardiomyopathy with an EF of 40%  In addition to above patient has been  unable to pass swallowing evaluations and subsequently has undergone percutaneous PEG tube placement on 12/30.  Patient is awake and moving all extremities spontaneously but does not follow commands and does not verbalize palliative care has met with patient and family and has emphasized poor prognosis and will continue to meet with the family during the hospitalization.  Current plan is to transition to SNF bed once available.  Expect need for lifelong 24/7 care for all ADLs.  Subjective: Alert and extremely talkative today.  This is the most I have heard this patient verbalized since I have begun taking care of him.  He initially thought he was at home but then later when I redirected and asked him if he was at the hospital he said yes.  He continues to request to be discharged home.  Try 2 weeks blamed to the patient that he needs somebody to stay with him all of the time and right now we do not have this available but we are certainly working on that and are trying to get him discharged as soon as possible.  He seemed to understand what I was telling him.  Objective: Vitals:   10/06/20 0408 10/06/20 0611  BP: (!) 141/85 (!) 148/103  Pulse: 79 81  Resp: 18   Temp: 98.1 F (36.7 C)   SpO2:  96%    Intake/Output Summary (Last 24 hours) at 10/06/2020 0756 Last data filed at 10/06/2020 0625 Gross per 24 hour  Intake 4200 ml  Output 1750  ml  Net 2450 ml   Filed Weights   10/04/20 0339 10/05/20 0056 10/06/20 0408  Weight: 89.8 kg 87 kg 88.5 kg    Exam: Constitutional: Alert, very interactive today, no acute distress Heart: Normal heart sounds, pulse regular, extremities warm Respiratory: Lungs are clear to auscultation and he is stable on room air Abdomen: On D2 diet -failed trial of D3 on 2/16 -PEG tube for nocturnal feedings.  LBM 2/16 Neurologic: CN 2-12 roughly without any appreciable focal neurological deficits, MOE x4, strength is 5/5.   Psychiatric: Awake and oriented times name  only although appears to be redirectable to location of hospital.  Not oriented to year.  Pleasant interactive affect today.   Assessment/Plan: Acute problems: Out of hospital cardiac arrest secondary to STEMI -ROSC and subsequent catheterization revealed disease in circumflex requiring DES -Continue beta-blocker, statin, Plavix, isosorbide and hydralazine  Significant anoxic brain injury secondary to cardiac arrest/deconditioning/gait instability -Consistently participating with PT and OT.  SLP has cleared for dysphagia 2 with thin liquids -Declined by CIR since family unable to provide 24/7 care.  Plan is for SNF -Increased nocturnal agitation-wandering "wants to go home" -Increase HS Seroquel to 50 mg and give dose earlier at 8 pm -2/18 add Seroquel 25 mg every 12 hours as needed for agitation and wandering -Continue daytime Symmetrel  **PT NOTES FROM 2/14**  Patient received in bed, shaking head that he does not want to get up and walk. Patient requires multimodal cues to perform activities today. Requires mod/max assist for bed mobility and continually attempting to lie back down once seated. Patient required +2 max assist to transfer to recliner from bed. He will continue to benefit from skilled PT while here to improve functional independence and safety.     ?  Acute metabolic encephalopathy -Patient typically less responsive in the mornings but seems to be more so over the past 2 days.  Skin felt hot but patient was afebrile -Labs including urinalysis unremarkable -Chest x-ray unremarkable -Abdomen slightly distended so bladder scan ordered as was single view abdominal x-ray  Situational depression -Started Celexa 2/16-will increase to 20 mg 2/18  Ischemic cardiomyopathy -Echo this admission EF 40% -Continue hydralazine and Isordil -No ACE inhibitor secondary to underlying kidney disease -Will need follow-up echo 3 months post initial echo which was completed on  07/23/2020  Hypertension -Was on Norvasc prior to admission -Continue cardiovascular meds as listed above  Acute on chronic kidney disease stage IIIb -Suspect prerenal etiology in the setting of hypoperfusion from ventricular fibrillation arrest -Baseline creatinine between 1.6 and 2; creatinine on 12/19 was 1.67-will repeat in a.m. on 08/19/2020 -Continue free water per tube and intermittently follow labs  Dysphagia, moderate protein calorie malnutrition Nutrition Problem: Inadequate oral intake Etiology: inability to eat Signs/Symptoms: NPO status  -Currently on nocturnal tube feedings.  Since 2/4 patient has eaten between 60 and 95% of meals.  If this trend continues likely can transition to oral diet only. -1/24 Resumed nocturnal TFs been ongoing poor oral intake during the day -Currently on D2 diet with thin liquids -Repeat SLP eval on 2/8 continues to recommend D2 diet with thin liquids. Interventions: Tube feeding,Prostat Estimated body mass index is 26.46 kg/m as calculated from the following:   Height as of this encounter: 6' (1.829 m).   Weight as of this encounter: 88.5 kg.     Other problems: Acute hypernatremia -Continue free water per tube  E. coli UTI -Has completed 5 days of Bactrim  Hyperglycemia without  an underlying diagnosis of diabetes mellitus Resolved -Likely related to acute stress -Recent issues with hypoglycemia so sliding scale insulin decreased to very sensitive and Levemir decreased from 30 twice daily to 15 twice daily as of 1/14 -Hemoglobin A1c 4.7   Acute hypoxemic respiratory failure -Resolved -Stable on room air with O2 sats between 97 and 99%  Nonsustained VT -Continue amiodarone and beta-blocker -1/31 LFTs normal  Skin tear right lateral back Wound / Incision (Open or Dehisced) 07/28/20 Skin tear Back Lateral;Right;Upper (Active)  Date First Assessed/Time First Assessed: 07/28/20 0300   Wound Type: Skin tear  Location: Back  Location  Orientation: Lateral;Right;Upper  Present on Admission: No    Assessments 07/28/2020  7:49 PM 09/26/2020  9:35 PM  Dressing Type None Foam - Lift dressing to assess site every shift  Dressing Status - None  Dressing Change Frequency - PRN  Site / Wound Assessment - Clean;Dry  % Wound base Red or Granulating - 0%  % Wound base Yellow/Fibrinous Exudate - 0%  % Wound base Black/Eschar - 0%  % Wound base Other/Granulation Tissue (Comment) - 0%  Peri-wound Assessment - Intact  Wound Length (cm) - 0 cm  Wound Width (cm) - 0 cm  Wound Depth (cm) - 0 cm  Wound Volume (cm^3) - 0 cm^3  Wound Surface Area (cm^2) - 0 cm^2  Margins - Attached edges (approximated)  Closure - None  Drainage Amount None None  Drainage Description - No odor  Non-staged Wound Description - Not applicable  Treatment Cleansed Cleansed     No Linked orders to display    Data Reviewed: Basic Metabolic Panel: Recent Labs  Lab 09/30/20 0938 10/05/20 0959  NA 139 138  K 3.9 4.0  CL 102 104  CO2 27 25  GLUCOSE 166* 133*  BUN 15 14  CREATININE 1.26* 1.25*  CALCIUM 9.4 9.1  MG 1.7  --    Liver Function Tests: Recent Labs  Lab 10/05/20 0959  AST 21  ALT 33  ALKPHOS 71  BILITOT 0.7  PROT 6.7  ALBUMIN 3.1*   No results for input(s): LIPASE, AMYLASE in the last 168 hours. No results for input(s): AMMONIA in the last 168 hours. CBC: Recent Labs  Lab 09/30/20 0938 10/05/20 0959  WBC 6.6 6.1  NEUTROABS  --  3.1  HGB 11.8* 12.0*  HCT 36.6* 35.8*  MCV 98.1 96.0  PLT 199 191   Cardiac Enzymes: No results for input(s): CKTOTAL, CKMB, CKMBINDEX, TROPONINI in the last 168 hours. BNP (last 3 results) No results for input(s): BNP in the last 8760 hours.  ProBNP (last 3 results) No results for input(s): PROBNP in the last 8760 hours.  CBG: Recent Labs  Lab 10/05/20 0633 10/05/20 1128 10/05/20 1513 10/06/20 0023 10/06/20 0557  GLUCAP 169* 143* 147* 117* 148*    No results found for this or any  previous visit (from the past 240 hour(s)).   Studies: DG CHEST PORT 1 VIEW  Result Date: 10/05/2020 CLINICAL DATA:  Fever. EXAM: PORTABLE CHEST 1 VIEW COMPARISON:  Chest x-ray dated July 30, 2020. FINDINGS: The heart size and mediastinal contours are within normal limits. Both lungs are clear. The visualized skeletal structures are unremarkable. IMPRESSION: No active disease. Electronically Signed   By: Titus Dubin M.D.   On: 10/05/2020 11:17    Scheduled Meds: . amantadine  100 mg Per Tube Daily  . aspirin  81 mg Per Tube Daily  . atorvastatin  80 mg Per Tube Daily  .  bethanechol  10 mg Per Tube TID  . carvedilol  25 mg Per Tube BID WC  . chlorhexidine gluconate (MEDLINE KIT)  15 mL Mouth Rinse BID  . citalopram  20 mg Per Tube Daily  . clopidogrel  75 mg Per Tube Daily  . COVID-19 mRNA vaccine (Moderna)  0.25 mL Intramuscular Once  . enoxaparin (LOVENOX) injection  40 mg Subcutaneous Q24H  . feeding supplement (PROSource TF)  45 mL Per Tube BID  . free water  200 mL Per Tube Q2H  . haloperidol lactate  1 mg Intravenous Once  . hydrALAZINE  37.5 mg Per Tube Q8H  . insulin aspart  0-15 Units Subcutaneous Q6H  . isosorbide dinitrate  20 mg Per Tube TID  . lipase/protease/amylase)  20,880 Units Per Tube Once   And  . sodium bicarbonate  650 mg Per Tube Once  . LORazepam  1 mg Intravenous Once  . mouth rinse  15 mL Mouth Rinse BID  . polyvinyl alcohol  1 drop Both Eyes QID  . QUEtiapine  50 mg Per Tube QHS  . sodium chloride flush  3 mL Intravenous Q12H   Continuous Infusions: . feeding supplement (JEVITY 1.5 CAL/FIBER) 780 mL (10/05/20 2226)    Active Problems:   Acute ST elevation myocardial infarction (STEMI) (HCC)   Cardiac arrest (HCC)   Elevated blood pressure reading with diagnosis of hypertension   Shock circulatory (HCC)   Encephalopathy   History of ETT   Cardiomyopathy, ischemic   Anoxic brain damage ([place])   Dysphagia   DNR (do not resuscitate)  discussion   Gait instability   E. coli UTI   Consultants:  Cardiology  Interventional radiology  PCCM  Neurology  Procedures:  Cardiac catheterization 12/4  Continuous EEG 12/4 and overnight EEG with video 12/5  Echocardiogram 12/5  EEG 12/9  Percutaneous PEG tube placement 12/30  Antibiotics: Anti-infectives (From admission, onward)   Start     Dose/Rate Route Frequency Ordered Stop   09/13/20 1000  sulfamethoxazole-trimethoprim (BACTRIM) 200-40 MG/5ML suspension 20 mL        20 mL Per Tube Every 12 hours 09/13/20 0734 09/19/20 0926   09/13/20 0745  cefTRIAXone (ROCEPHIN) 1 g in sodium chloride 0.9 % 100 mL IVPB  Status:  Discontinued        1 g 200 mL/hr over 30 Minutes Intravenous Every 24 hours 09/13/20 0658 09/13/20 0734   08/17/20 0000  ceFAZolin (ANCEF) IVPB 2g/100 mL premix        2 g 200 mL/hr over 30 Minutes Intravenous To Radiology 08/15/20 1346 08/17/20 1111   07/22/20 0830  cefTRIAXone (ROCEPHIN) 2 g in sodium chloride 0.9 % 100 mL IVPB        2 g 200 mL/hr over 30 Minutes Intravenous Every 24 hours 07/22/20 0720 07/26/20 0858       Time spent: 20 minutes    Erin Hearing ANP  [facility] 7 am - 330 pm/M-F for direct patient care and secure chat Please refer to Amion for contact information 76 days

## 2020-10-06 NOTE — Progress Notes (Signed)
Physical Therapy Treatment Patient Details Name: Antonio Navarro MRN: [id number] DOB: [birth year] Today's Date: 10/06/2020    History of Present Illness [name] with history of tobacco abuse, obesity, HTN who was admitted 07/22/20 post cardiac arrest with inferolateral STEMI. He was resuscitated in the field by EMS after being found to be in V fib. Approximately 30 minutes of CPR. He was intubated on arrival.  Pt with anoxic brain injury. PEG placed on 08/17/20.    PT Comments    Pt received in supine, mitts on and agreeable to therapy session and with pleasant demeanor/smiling, sister present in room and supportive. Pt performed bed mobility, transfers and short gait task at bedside with minA (sister present for safety but not needing to physically assist), pt bed noted to be wet and pt able to follow instruction for assisting with peri-care in front and tolerated sitting EOB ~5 minutes prior to chair transfer, VSS. Deferred longer gait trial for safety due to lack of +2 assist and pt wanting to work with OT, left in care of OT at end of session. Pt continues to benefit from PT services to progress toward functional mobility goals. Continue to recommend SNF.  Follow Up Recommendations  SNF;Supervision/Assistance - 24 hour     Equipment Recommendations  None recommended by PT    Recommendations for Other Services       Precautions / Restrictions Precautions Precautions: Fall Precaution Comments: g tube, mitts Restrictions Weight Bearing Restrictions: No    Mobility  Bed Mobility Overal bed mobility: Needs Assistance Bed Mobility: Sidelying to Sit Rolling: Min guard Sidelying to sit: Min assist       General bed mobility comments: Pt bringing LLE off edge of bed, requires minA for initiation of RLE to edge of bed and trunk to upright    Transfers Overall transfer level: Needs assistance Equipment used: Rolling walker (2 wheeled)   Sit to Stand: Min assist;+2  safety/equipment         General transfer comment: minA to stand from bed height to RW but unable to follow cues to push from bed; sister was present for safety but not needed for lift assist  Ambulation/Gait Ambulation/Gait assistance: Min assist Gait Distance (Feet): 5 Feet Assistive device: Rolling walker (2 wheeled) Gait Pattern/deviations: Narrow base of support;Decreased stride length;Step-through pattern Gait velocity: decreased   General Gait Details: distance limited due to lack of +2 assist but no overt LOB; OT entering room with +2 assist available and planning to work with pt   Marine scientist [place] (Stroke Patients Only)       Balance Overall balance assessment: Needs assistance Sitting-balance support: Feet supported Sitting balance-Leahy Scale: Good     Standing balance support: Bilateral upper extremity supported;During functional activity Standing balance-Leahy Scale: Poor Standing balance comment: requires BUE support during mobility and external assist                            Cognition Arousal/Alertness: Awake/alert Behavior During Therapy: Restless (smiling/participatory) Overall Cognitive Status: Impaired/Different from baseline Area of Impairment: Attention;Following commands;Problem solving;Safety/judgement;Awareness                 Orientation Level: Disoriented to;Place;Time;Situation Current Attention Level: Selective Memory: Decreased short-term memory;Decreased recall of precautions Following Commands: Follows one step commands inconsistently;Follows one step commands with increased time Safety/Judgement: Decreased awareness  of deficits;Decreased awareness of safety Awareness: Intellectual Problem Solving: Slow processing;Difficulty sequencing;Decreased initiation;Requires verbal cues;Requires tactile cues General Comments: pt participatory, good command following, slightly  impulsive/restless but following instructions to wait for staff prior to standing, etc; mitts doffed for mobility and remained off at end of session as OT entering room.      Exercises General Exercises - Lower Extremity Ankle Circles/Pumps: AROM;Both;5 reps;Supine Long Arc Quad: AAROM;Both;10 reps;Seated Hip Flexion/Marching: AROM;Both;10 reps;Seated    General Comments        Pertinent Vitals/Pain Pain Assessment: Faces Faces Pain Scale: No hurt Pain Intervention(s): Monitored during session    Home Living                      Prior Function            PT Goals (current goals can now be found in the care plan section) Acute Rehab PT Goals Patient Stated Goal: none stated PT Goal Formulation: With patient Time For Goal Achievement: 10/09/20 Potential to Achieve Goals: Fair Progress towards PT goals: Progressing toward goals    Frequency    Min 3X/week      PT Plan Current plan remains appropriate    Co-evaluation              AM-PAC PT "6 Clicks" Mobility   Outcome Measure  Help needed turning from your back to your side while in a flat bed without using bedrails?: A Little Help needed moving from lying on your back to sitting on the side of a flat bed without using bedrails?: A Little Help needed moving to and from a bed to a chair (including a wheelchair)?: A Little Help needed standing up from a chair using your arms (e.g., wheelchair or bedside chair)?: A Little Help needed to walk in hospital room?: A Little Help needed climbing 3-5 steps with a railing? : A Lot 6 Click Score: 17    End of Session Equipment Utilized During Treatment: Gait belt Activity Tolerance: Patient tolerated treatment well Patient left: in chair;with call bell/phone within reach;with family/visitor present;Other (comment) (in care of OT) Nurse Communication: Mobility status PT Visit Diagnosis: Other abnormalities of gait and mobility (R26.89);Difficulty in  walking, not elsewhere classified (R26.2)     Time: 9735-3299 PT Time Calculation (min) (ACUTE ONLY): 12 min  Charges:  $Therapeutic Activity: 8-22 mins                     Cloma Rahrig P., PTA Acute Rehabilitation Services Pager: [phone] Office: [place] 10/06/2020, 4:04 PM

## 2020-10-06 NOTE — Progress Notes (Addendum)
Patient constantly trying to get out of bed, pulling external cath and attempting to pull NG tube. PRN med given, RN tried to redirect patient but does not seen to be helping. Floor staff sitting in room with patient.  MD Broadus John made aware, see new orders on MAR.

## 2020-10-06 NOTE — Progress Notes (Signed)
Occupational Therapy Treatment Patient Details Name: Antonio Navarro MRN: [id number] DOB: [birth year] Today's Date: 10/06/2020    History of present illness [name] with history of tobacco abuse, obesity, HTN who was admitted 07/22/20 post cardiac arrest with inferolateral STEMI. He was resuscitated in the field by EMS after being found to be in V fib. Approximately 30 minutes of CPR. He was intubated on arrival.  Pt with anoxic brain injury. PEG placed on 08/17/20.   OT comments  Pt received from PTA seated in recliner. Session focus on functional mobility and BADL reeducation. Pt able to complete functional mobility greater than a household distance with RW and MIN A +2. Pt additionally able to complete standing ADLs at sink with min guard for balance. Pt following one step commands and noted to be more participatory and attentive during session, nodding and smiling appropriately. Pt would continue to benefit from skilled occupational therapy while admitted and after d/c to address the below listed limitations in order to improve overall functional mobility and facilitate independence with BADL participation. DC plan remains appropriate, will follow acutely per POC.     Follow Up Recommendations  SNF;Supervision/Assistance - 24 hour    Equipment Recommendations  Wheelchair (measurements OT);Wheelchair cushion (measurements OT);Hospital bed;3 in 1 bedside commode    Recommendations for Other Services      Precautions / Restrictions Precautions Precautions: Fall Precaution Comments: g tube, mitts Restrictions Weight Bearing Restrictions: No       Mobility Bed Mobility       General bed mobility comments: pt up in recliner at start of session and returned pt to recliner at end of session  Transfers Overall transfer level: Needs assistance Equipment used: Rolling walker (2 wheeled) Transfers: Sit to/from Stand Sit to Stand: Min assist;+2 safety/equipment          General transfer comment: x2 sit<>stands from recliner with cues for hand placement and assist to rise in standing    Balance Overall balance assessment: Needs assistance Sitting-balance support: Feet supported Sitting balance-Leahy Scale: Good     Standing balance support: Bilateral upper extremity supported;During functional activity;Single extremity supported Standing balance-Leahy Scale: Poor Standing balance comment: at least one UE supported during ADLs and functional mobility                           ADL either performed or assessed with clinical judgement   ADL Overall ADL's : Needs assistance/impaired     Grooming: Wash/dry hands;Standing;Min guard Grooming Details (indicate cue type and reason): minguard for safety with standing ADLs, pt following all commands related to ADL participation this session         Upper Body Dressing : Minimal assistance;Sitting Upper Body Dressing Details (indicate cue type and reason): to don gown as back side cover     Toilet Transfer: Minimal assistance;+2 for physical assistance;+2 for safety/equipment;Ambulation;RW Toilet Transfer Details (indicate cue type and reason): simulated via functional mobility with RW and MIN A +2 mostly for safety, assist at times needed for RW mgmt         Functional mobility during ADLs: +2 for physical assistance;Minimal assistance;+2 for safety/equipment General ADL Comments: pt with improvements in following commands, pt standing at sink to wash hands with minguard assist, noted to more attentive, nodding appropraitely and geturing appropriately, even giving COTA high five at end of session     Vision       Perception  Praxis      Cognition Arousal/Alertness: Awake/alert Behavior During Therapy: WFL for tasks assessed/performed Overall Cognitive Status: Impaired/Different from baseline Area of Impairment: Attention;Following commands;Problem  solving;Awareness;Safety/judgement                 Orientation Level: Disoriented to;Place;Time;Situation Current Attention Level: Selective Memory: Decreased short-term memory;Decreased recall of precautions Following Commands: Follows one step commands consistently;Follows multi-step commands with increased time Safety/Judgement: Decreased awareness of safety (attempt sit<>stand without RW) Awareness: Intellectual Problem Solving: Slow processing;Difficulty sequencing;Decreased initiation;Requires verbal cues;Requires tactile cues General Comments: pt very participatory this sesssion noted to be very attentive to session, smiling and nodding appropriately, pt following all one step commands related to mobility and ADL participation with 100% accuracy, increased time for multi step commands        Exercises Exercises: General Lower Extremity General Exercises - Lower Extremity Ankle Circles/Pumps: AROM;Both;5 reps;Supine Long Arc Quad: AAROM;Both;10 reps;Seated Hip Flexion/Marching: AROM;Both;10 reps;Seated   Shoulder Instructions       General Comments pts sister present during session, very helpful and supportive    Pertinent Vitals/ Pain       Pain Assessment: Faces Faces Pain Scale: No hurt Pain Intervention(s): Monitored during session  Home Living                                          Prior Functioning/Environment              Frequency  Min 2X/week        Progress Toward Goals  OT Goals(current goals can now be found in the care plan section)  Progress towards OT goals: Progressing toward goals  Acute Rehab OT Goals Patient Stated Goal: none stated OT Goal Formulation: Patient unable to participate in goal setting Time For Goal Achievement: 10/12/20 Potential to Achieve Goals: [place] Discharge plan remains appropriate;Frequency remains appropriate    Co-evaluation                 AM-PAC OT "6 Clicks" Daily  Activity     Outcome Measure     Help from another person taking care of personal grooming?: A Little Help from another person toileting, which includes using toliet, bedpan, or urinal?: A Lot Help from another person bathing (including washing, rinsing, drying)?: A Lot Help from another person to put on and taking off regular upper body clothing?: A Little Help from another person to put on and taking off regular lower body clothing?: A Lot 6 Click Score: 12    End of Session Equipment Utilized During Treatment: Gait belt;Rolling walker  OT Visit Diagnosis: Muscle weakness (generalized) (M62.81);Pain;Other abnormalities of gait and mobility (R26.89);Cognitive communication deficit (R41.841);Low vision, both eyes (H54.2) Symptoms and signs involving cognitive functions: Other Nontraumatic ICH   Activity Tolerance Patient tolerated treatment well   Patient Left in chair;with call bell/phone within reach;with nursing/sitter in room;with family/visitor present   Nurse Communication Mobility status        Time: 1435-1451 OT Time Calculation (min): 16 min  Charges: OT General Charges $OT Visit: 1 Visit OT Treatments $Self Care/Home Management : 8-22 mins  [name]., COTA/L Acute Rehabilitation Services [phone]   Precious Haws 10/06/2020, 4:15 PM

## 2020-10-06 NOTE — Progress Notes (Signed)
  Speech Language Pathology Treatment: Cognitive-Linquistic  Patient Details Name: Antonio Navarro MRN: [id number] DOB: Sep 29, 1960 Today's Date: 10/06/2020 Time: 6568-1275 SLP Time Calculation (min) (ACUTE ONLY): 20 min  Assessment / Plan / Recommendation Clinical Impression  Upon SLP arrival, pt was found to have d/c his feeding tube and catheter, lying in a soiled bed. Tx focused on cognitive and communicative goals as SLP/RN assisted pt with bed mobility and cleaning. Pt was frustrated throughout session and very verbal, speaking in short sentences for the first time with this SLP. Attempted to have him recall orientation information that was provided to him by NP at the start of the session, but after brief delay he needed Total A. Mod cues were given for simple command following - a little more than usual, but suspect this could be attributed to his frustration. Pt was also impulsive throughout session, requiring frequent cues to stop pulling at tubes. Will continue to follow.    HPI HPI: Pt is a 60 yo male s/p arrest with downtime 9 minutes. Initial rhythm was V. fib, shocked and received 6 rounds of epi before ROSC achieved.  Total CPR time was 30 minutes. ETT 12/4-12/13. MRI 12/7 suggestive of a widespread anoxic injury. PMH includes kidney stones and HTN.      SLP Plan  Continue with current plan of care       Recommendations  Diet recommendations: Dysphagia 2 (fine chop);Thin liquid Liquids provided via: Cup;Straw Medication Administration: Crushed with puree Supervision: Staff to assist with self feeding;Full supervision/cueing for compensatory strategies Compensations: Slow rate;Small sips/bites;Minimize environmental distractions Postural Changes and/or Swallow Maneuvers: Seated upright 90 degrees                Oral Care Recommendations: Oral care BID Follow up Recommendations: Skilled Nursing facility SLP Visit Diagnosis: Dysphagia, oral phase (R13.11) Plan: Continue  with current plan of care       GO                Osie Bond., M.A. [place] Acute Rehabilitation Services Pager [phone] Office [phone]  10/06/2020, 10:12 AM

## 2020-10-06 NOTE — Progress Notes (Signed)
Day shift RN gave PRN IM medication, patient continues to display threatening and unsafe behavior. Staff tried to get an IV for medications and patient would not stay still and was cursing staff-IV consult discontinued.  Patient was left alone to chil out and then started to constantly get out of the bed and remove clothing, safety mittens applied and patient tried to bite them off-blood on patients mouth from biting.   Patient may need restraints until he calms down, will page physician on call for restraint order.

## 2020-10-06 NOTE — Plan of Care (Signed)
  Problem: Clinical Measurements: Goal: Will remain free from infection Outcome: Adequate for Discharge   Problem: Activity: Goal: Risk for activity intolerance will decrease Outcome: Adequate for Discharge

## 2020-10-07 LAB — GLUCOSE, CAPILLARY
Glucose-Capillary: 176 mg/dL — ABNORMAL HIGH (ref 70–99)
Glucose-Capillary: 123 mg/dL — ABNORMAL HIGH (ref 70–99)
Glucose-Capillary: 103 mg/dL — ABNORMAL HIGH (ref 70–99)

## 2020-10-07 NOTE — Plan of Care (Signed)
  Problem: Health Behavior/Discharge Planning: Goal: Ability to manage health-related needs will improve Outcome: Adequate for Discharge   Problem: Clinical Measurements: Goal: Ability to maintain clinical measurements within normal limits will improve Outcome: Adequate for Discharge Goal: Will remain free from infection Outcome: Adequate for Discharge Goal: Diagnostic test results will improve Outcome: Adequate for Discharge Goal: Respiratory complications will improve Outcome: Adequate for Discharge Goal: Cardiovascular complication will be avoided Outcome: Adequate for Discharge   Problem: Activity: Goal: Risk for activity intolerance will decrease Outcome: Adequate for Discharge   Problem: Nutrition: Goal: Adequate nutrition will be maintained Outcome: Adequate for Discharge   Problem: Coping: Goal: Level of anxiety will decrease Outcome: Adequate for Discharge   Problem: Elimination: Goal: Will not experience complications related to bowel motility Outcome: Adequate for Discharge Goal: Will not experience complications related to urinary retention Outcome: Adequate for Discharge   Problem: Safety: Goal: Ability to remain free from injury will improve Outcome: Adequate for Discharge   Problem: Skin Integrity: Goal: Risk for impaired skin integrity will decrease Outcome: Adequate for Discharge   Problem: Education: Goal: Ability to manage disease process will improve Outcome: Adequate for Discharge   Problem: Cardiac: Goal: Ability to achieve and maintain adequate cardiopulmonary perfusion will improve Outcome: Adequate for Discharge   Problem: Neurologic: Goal: Promote progressive neurologic recovery Outcome: Adequate for Discharge   Problem: Skin Integrity: Goal: Risk for impaired skin integrity will be minimized. Outcome: Adequate for Discharge   Problem: Education: Goal: Ability to demonstrate management of disease process will improve Outcome:  Adequate for Discharge Goal: Ability to verbalize understanding of medication therapies will improve Outcome: Adequate for Discharge   Problem: Activity: Goal: Capacity to carry out activities will improve Outcome: Adequate for Discharge   Problem: Cardiac: Goal: Ability to achieve and maintain adequate cardiopulmonary perfusion will improve Outcome: Adequate for Discharge   Problem: Safety: Goal: Non-violent Restraint(s) Outcome: Adequate for Discharge   Problem: Safety: Goal: Violent Restraint(s) Outcome: Adequate for Discharge

## 2020-10-07 NOTE — Progress Notes (Signed)
Patient seen and examined, had issues with agitation overnight, now with mittens, not in restraints -Continue current medication regimen -No changes from last progress note per Erin Hearing, NP  Domenic Polite, MD

## 2020-10-08 LAB — GLUCOSE, CAPILLARY
Glucose-Capillary: 118 mg/dL — ABNORMAL HIGH (ref 70–99)
Glucose-Capillary: 145 mg/dL — ABNORMAL HIGH (ref 70–99)
Glucose-Capillary: 168 mg/dL — ABNORMAL HIGH (ref 70–99)
Glucose-Capillary: 81 mg/dL (ref 70–99)

## 2020-10-08 MED ORDER — HYDRALAZINE HCL 25 MG PO TABS
25.0000 mg | ORAL_TABLET | Freq: Three times a day (TID) | ORAL | Status: DC
  Administered 2020-10-08 – 2020-10-17 (×29): 25 mg
  Filled 2020-10-08 (×30): qty 1

## 2020-10-08 MED ORDER — ISOSORBIDE DINITRATE 10 MG PO TABS
10.0000 mg | ORAL_TABLET | Freq: Three times a day (TID) | ORAL | Status: DC
  Administered 2020-10-09 – 2020-10-18 (×28): 10 mg
  Filled 2020-10-08 (×28): qty 1

## 2020-10-08 NOTE — Progress Notes (Signed)
Pt has been calm and relaxed since the beginning of the shift. He has not been agitated or irritable on this shift so far. Mittens to bilateral hands in place. No distress or concerns at the moment. Will continue monitoring the patient closely.

## 2020-10-08 NOTE — Progress Notes (Signed)
Patient seen and examined, briefly 60 year old male brought to the hospital after an out of hospital cardiac arrest with downtime of 9 minutes, subsequent work-up noted STEMI and underwent left heart cath had drug-eluting stent placed to circumflex, has anoxic encephalopathy. -Remains stable -Agitation overall improved as well -Today laying in bed watching TV, tolerating tube feeds as well as dysphagia 2 diet. Awaiting Medicaid and disability for long-term SNF  Antonio Navarro

## 2020-10-09 LAB — GLUCOSE, CAPILLARY
Glucose-Capillary: 120 mg/dL — ABNORMAL HIGH (ref 70–99)
Glucose-Capillary: 123 mg/dL — ABNORMAL HIGH (ref 70–99)
Glucose-Capillary: 128 mg/dL — ABNORMAL HIGH (ref 70–99)

## 2020-10-09 MED ORDER — QUETIAPINE FUMARATE 100 MG PO TABS
100.0000 mg | ORAL_TABLET | ORAL | Status: DC
  Administered 2020-10-09: 100 mg
  Filled 2020-10-09: qty 1

## 2020-10-09 NOTE — Progress Notes (Signed)
Physical Therapy Treatment Patient Details Name: Antonio Navarro MRN: [id number] DOB: Jan 07, 1961 Today's Date: 10/09/2020    History of Present Illness [name] with history of tobacco abuse, obesity, HTN who was admitted 07/22/20 post cardiac arrest with inferolateral STEMI. He was resuscitated in the field by EMS after being found to be in V fib. Approximately 30 minutes of CPR. He was intubated on arrival.  Pt with anoxic brain injury. PEG placed on 08/17/20.    PT Comments    Pt progressing well towards his physical therapy goals; smiling occasionally and more talkative, although at times with unintelligible speech. Pt with urinary incontinence upon arrival. Assisted onto [facility] to get cleaned up. Then ambulating x 280 feet with a walker, min assist and close chair follow. Continues with cognitive deficits, balance impairments, and weakness. Continue to recommend SNF for ongoing Physical Therapy.      Follow Up Recommendations  SNF;Supervision/Assistance - 24 hour     Equipment Recommendations  None recommended by PT    Recommendations for Other Services       Precautions / Restrictions Precautions Precautions: Fall Precaution Comments: g tube Restrictions Weight Bearing Restrictions: No    Mobility  Bed Mobility Overal bed mobility: Needs Assistance Bed Mobility: Supine to Sit Rolling: Supervision         General bed mobility comments: Mod cues for initiation, no physical assist required    Transfers Overall transfer level: Needs assistance Equipment used: Rolling walker (2 wheeled);None Transfers: Sit to/from [employer] to Stand: Min assist Stand pivot transfers: Min assist       General transfer comment: MinA to rise from edge of bed and pivot onto [facility]  Ambulation/Gait Ambulation/Gait assistance: Min assist;+2 safety/equipment Gait Distance (Feet): 280 Feet Assistive device: Rolling walker (2 wheeled) Gait Pattern/deviations:  Narrow base of support;Decreased stride length;Step-through pattern Gait velocity: decreased   General Gait Details: Cues for keeping walker on ground, maintaining straight path, min A for balance and close chair follow. Pt with lateral LOB with distraction and tendency to drift   Stairs             Wheelchair Mobility    Modified Rankin (Stroke Patients Only)       Balance Overall balance assessment: Needs assistance Sitting-balance support: Feet supported Sitting balance-Leahy Scale: Good     Standing balance support: Bilateral upper extremity supported;During functional activity;Single extremity supported Standing balance-Leahy Scale: Poor                              Cognition Arousal/Alertness: Awake/alert Behavior During Therapy: WFL for tasks assessed/performed Overall Cognitive Status: Impaired/Different from baseline Area of Impairment: Attention;Following commands;Problem solving;Awareness;Safety/judgement                   Current Attention Level: Selective Memory: Decreased short-term memory;Decreased recall of precautions Following Commands: Follows one step commands consistently;Follows multi-step commands with increased time Safety/Judgement: Decreased awareness of safety Awareness: Intellectual Problem Solving: Slow processing;Difficulty sequencing;Decreased initiation;Requires verbal cues;Requires tactile cues        Exercises      General Comments        Pertinent Vitals/Pain Pain Assessment: Faces Faces Pain Scale: No hurt    Home Living                      Prior Function            PT  Goals (current goals can now be found in the care plan section) Acute Rehab PT Goals Patient Stated Goal: go home PT Goal Formulation: With patient Time For Goal Achievement: 10/23/20 Potential to Achieve Goals: Fair Progress towards PT goals: Progressing toward goals    Frequency    Min 3X/week      PT Plan  Current plan remains appropriate    Co-evaluation              AM-PAC PT "6 Clicks" Mobility   Outcome Measure  Help needed turning from your back to your side while in a flat bed without using bedrails?: None Help needed moving from lying on your back to sitting on the side of a flat bed without using bedrails?: None Help needed moving to and from a bed to a chair (including a wheelchair)?: A Little Help needed standing up from a chair using your arms (e.g., wheelchair or bedside chair)?: A Little Help needed to walk in hospital room?: A Little Help needed climbing 3-5 steps with a railing? : A Lot 6 Click Score: 19    End of Session Equipment Utilized During Treatment: Gait belt Activity Tolerance: Patient tolerated treatment well Patient left: in chair;with call bell/phone within reach;with chair alarm set Nurse Communication: Mobility status PT Visit Diagnosis: Other abnormalities of gait and mobility (R26.89);Difficulty in walking, not elsewhere classified (R26.2)     Time: 2500-3704 PT Time Calculation (min) (ACUTE ONLY): 17 min  Charges:  $Gait Training: 8-22 mins                     Wyona Almas, PT, DPT Acute Rehabilitation Services Pager [phone] Office [phone]    Deno Etienne 10/09/2020, 5:14 PM

## 2020-10-09 NOTE — NC FL2 (Signed)
[place] MEDICAID FL2 LEVEL OF CARE SCREENING TOOL     IDENTIFICATION  Patient Name: Antonio Navarro Birthdate: 03/06/1961 Sex: male Admission Date (Current Location): 07/22/2020  [facility] and [place] Number:  Herbalist and Address:  [facility], [place] [address] [place], [place] [address]      Provider Number: [id number]  Attending Physician Name and Address:  Domenic Polite, MD  Relative Name and Phone Number:  [name], Carstarphen (Son)   [phone] [facility])    Current Level of Care: Hospital Recommended Level of Care: [place] Prior Approval Number:    Date Approved/Denied:   PASRR Number: [id number]  Discharge Plan: SNF    Current Diagnoses: Patient Active Problem List   Diagnosis Date Noted  . E. coli UTI   . Gait instability   . DNR (do not resuscitate) discussion   . Anoxic brain damage (HCC)   . Dysphagia   . Cardiomyopathy, ischemic   . History of ETT   . Encephalopathy   . Acute ST elevation myocardial infarction (STEMI) ([place])   . Cardiac arrest ([place])   . Elevated blood pressure reading with diagnosis of hypertension   . Shock circulatory ([place])     Orientation RESPIRATION BLADDER Height & Weight     Self,Place  Normal External catheter Weight: 187 lb 13.3 oz (85.2 kg) Height:  6' (182.9 cm)  BEHAVIORAL SYMPTOMS/MOOD NEUROLOGICAL BOWEL NUTRITION STATUS      Incontinent Diet,Feeding tube (PEG)  AMBULATORY STATUS COMMUNICATION OF NEEDS Skin   Extensive Assist Verbally (Limited speech) Normal                       Personal Care Assistance Level of Assistance  Bathing,Feeding,Dressing Bathing Assistance: Maximum assistance Feeding assistance: Maximum assistance Dressing Assistance: Maximum assistance     Functional Limitations Info  Sight,Hearing,Speech Sight Info: Adequate Hearing Info: Adequate Speech Info: Impaired    SPECIAL CARE FACTORS FREQUENCY  PT (By licensed PT),OT (By licensed  OT),Speech therapy     PT Frequency: 5x weekly OT Frequency: 5x weekly     Speech Therapy Frequency: 5x weekly      Contractures Contractures Info: Not present    Additional Factors Info  Code Status,Allergies Code Status Info: Full Allergies Info: No known allergies           Current Medications (10/09/2020):  This is the current hospital active medication list Current Facility-Administered Medications  Medication Dose Route Frequency Provider Last Rate Last Admin  . acetaminophen (TYLENOL) tablet 650 mg  650 mg Per Tube Q6H PRN Zierle-Ghosh, Asia B, DO   650 mg at 09/14/20 0813  . amantadine (SYMMETREL) 50 MG/5ML solution 100 mg  100 mg Per Tube Daily Cristal Ford, DO   100 mg at 10/09/20 0913  . aspirin chewable tablet 81 mg  81 mg Per Tube Daily Blenda Nicely, RPH   81 mg at 10/09/20 3754  . atorvastatin (LIPITOR) tablet 80 mg  80 mg Per Tube Daily Blenda Nicely, RPH   80 mg at 10/09/20 0913  . carvedilol (COREG) tablet 25 mg  25 mg Per Tube BID WC Kipp Brood, MD   25 mg at 10/09/20 0913  . chlorhexidine gluconate (MEDLINE KIT) (PERIDEX) 0.12 % solution 15 mL  15 mL Mouth Rinse BID Lorretta Harp, MD   15 mL at 10/09/20 0914  . citalopram (CELEXA) tablet 20 mg  20 mg Per Tube Daily Samella Parr,  NP   20 mg at 10/09/20 0913  . clopidogrel (PLAVIX) tablet 75 mg  75 mg Per Tube Daily Samella Parr, NP   75 mg at 10/09/20 0913  . COVID-19 mRNA vaccine (Moderna) injection 0.25 mL  0.25 mL Intramuscular Once Samella Parr, NP      . docusate (COLACE) 50 MG/5ML liquid 100 mg  100 mg Per Tube BID PRN Blenda Nicely, RPH      . enoxaparin (LOVENOX) injection 40 mg  40 mg Subcutaneous Q24H Barb Merino, MD   40 mg at 10/08/20 1832  . feeding supplement (JEVITY 1.5 CAL/FIBER) liquid 780 mL  780 mL Per Tube Continuous Barb Merino, MD 65 mL/hr at 10/07/20 2124 780 mL at 10/07/20 2124  . feeding supplement (PROSource TF) liquid 45 mL  45 mL Per Tube BID  [place] Eric J, DO   45 mL at 10/09/20 6168  . free water 200 mL  200 mL Per Tube Q2H Samella Parr, NP   200 mL at 10/09/20 0915  . Gerhardt's butt cream   Topical PRN Charlynne Cousins, MD   1 application at 37/29/02 1716  . haloperidol lactate (HALDOL) injection 2 mg  2 mg Intramuscular Daily PRN Domenic Polite, MD   2 mg at 10/06/20 1920  . hydrALAZINE (APRESOLINE) tablet 25 mg  25 mg Per Tube Marlou Starks, MD   25 mg at 10/09/20 0538  . insulin aspart (novoLOG) injection 0-15 Units  0-15 Units Subcutaneous Q6H Shalhoub, Sherryll Burger, MD   2 Units at 10/09/20 [phone]  . isosorbide dinitrate (ISORDIL) tablet 10 mg  10 mg Per Tube TID Domenic Polite, MD   10 mg at 10/09/20 0913  . MEDLINE mouth rinse  15 mL Mouth Rinse BID Jacky Kindle, MD   15 mL at 10/09/20 0915  . metoprolol tartrate (LOPRESSOR) injection 5 mg  5 mg Intravenous Q6H PRN Sherren Mocha, MD   5 mg at 08/01/20 5208  . neomycin-bacitracin-polymyxin (NEOSPORIN) ointment   Topical PRN Reubin Milan, MD   1 application at 10/11/34 1031  . polyethylene glycol (MIRALAX / GLYCOLAX) packet 17 g  17 g Per Tube Daily PRN Blenda Nicely, RPH   17 g at 09/05/20 1840  . polyvinyl alcohol (LIQUIFILM TEARS) 1.4 % ophthalmic solution 1 drop  1 drop Both Eyes QID Candee Furbish, MD   1 drop at 10/08/20 2241  . QUEtiapine (SEROQUEL) tablet 100 mg  100 mg Per Tube Daily Samella Parr, NP      . QUEtiapine (SEROQUEL) tablet 25 mg  25 mg Oral Q12H PRN Samella Parr, NP   25 mg at 10/06/20 1723  . sodium chloride flush (NS) 0.9 % injection 3 mL  3 mL Intravenous Q12H Lorretta Harp, MD   3 mL at 09/28/20 0912  . sodium chloride flush (NS) 0.9 % injection 3 mL  3 mL Intravenous PRN Lorretta Harp, MD         Discharge Medications: Please see discharge summary for a list of discharge medications.  Relevant Imaging Results:  Relevant Lab Results:   Additional Information SSN: Kilmichael Watson,  [place]

## 2020-10-09 NOTE — Progress Notes (Signed)
[facility] PROGRESS NOTE  Antonio Navarro GHW:[id number] DOB: November 06, 1960 DOA: 07/22/2020 PCP: Patient, No Pcp Per       Status: Remains inpatient appropriate because:Altered mental status and Unsafe d/c plan   Dispo: The patient is from: Home              Anticipated d/c is to: SNF              Anticipated d/c date is: > 3 days              Patient currently is medically stable to d/c.  Barriers to discharge: Medicaid and disability pending. No SNF bed offers.  As of 2/18 patient has had increasing nocturnal agitation with wandering and has required as needed medications as well as safety sitter-hopefully with adjustment in his evening Seroquel these behaviors will resolve   Code Status: Full Family Communication: LCSW spoke with patient's sister today on 2/15-would like CIR to review pt again DVT prophylaxis: sq heparin Vaccination status: Completed both doses of [place] Covid vaccination on 12/14/2019; no option for [employer] booster at this facility therefore [place] [facility] to ordered on 09/21/2020 but was not given  Foley catheter: No  HPI: 60 year old male history of hypertension prescribed Norvasc prior to admission, chronic back pain, prior renal calculi who experienced an out of hospital cardiac arrest with downtime of 9 minutes.  Initial rhythm was ventricular fibrillation.  He was defibrillated deceived 6 doses of epi before ROSC.  Subsequent work-up revealed STEMI.  He underwent cardiac catheterization with a DES placed to the circumflex.  Cardiology following and has placed patient on Brilinta and aspirin and is also being treated with beta-blocker and statins.  From a neurological standpoint he clinically has anoxic brain injury.  During this hospitalization he experienced AKI and was treated with fluids and correction of hypoperfusion from cardiac arrest.  Echo performed this admission revealed ischemic cardiomyopathy with an EF of 40%  In addition to above patient has been  unable to pass swallowing evaluations and subsequently has undergone percutaneous PEG tube placement on 12/30.  Patient is awake and moving all extremities spontaneously but does not follow commands and does not verbalize palliative care has met with patient and family and has emphasized poor prognosis and will continue to meet with the family during the hospitalization.  Current plan is to transition to SNF bed once available.  Expect need for lifelong 24/7 care for all ADLs.  Subjective: Awake.  Interactive during conversation.  Minimal verbal response but would smile appropriately.  Discussed with him that we understand that he is frustrated that he cannot be discharged from the hospital just yet but we are working on a safe discharge plan for him.  Objective: Vitals:   10/08/20 1949 10/09/20 0547  BP: (!) 126/97 (!) 159/96  Pulse: 85 79  Resp: 19 16  Temp: 98.5 F (36.9 C) 98.3 F (36.8 C)  SpO2:  100%    Intake/Output Summary (Last 24 hours) at 10/09/2020 0740 Last data filed at 10/09/2020 0645 Gross per 24 hour  Intake 600 ml  Output 1700 ml  Net -1100 ml   Filed Weights   10/09/20 0028 10/09/20 0500 10/09/20 0547  Weight: 86.8 kg 86.8 kg 85.2 kg    Exam: Constitutional: Awake alert and in no acute distress. Heart: Normal heart sounds, pulses regular nontachycardic, extremities without edema Respiratory: Lungs are clear to auscultation anterior.  No increased work of breathing, stable on room air Abdomen: On D2 diet -  PEG tube for nocturnal feedings.  LBM 2/21 Neurologic: CN 2-12 roughly without any appreciable focal neurological deficits, MOE x4, strength is 5/5.   Psychiatric:    Assessment/Plan: Acute problems: Out of hospital cardiac arrest secondary to STEMI -ROSC and subsequent catheterization revealed disease in circumflex requiring DES -Continue beta-blocker, statin, Plavix, isosorbide and hydralazine  Significant anoxic brain injury secondary to cardiac  arrest/deconditioning/gait instability -Consistently participating with PT and OT.  SLP has cleared for dysphagia 2 with thin liquids -Declined by CIR since family unable to provide 24/7 care.  Plan is for SNF -Continues to have issues with intermittent nocturnal agitation-wandering "wants to go home"-Seroquel dosage had been increased to 50 mg in dose adjusted to 8 PM as opposed to 10 PM.  2/21 we will increase to 100 mg and may need to consider adding bedtime Vistaril scheduled as well. -2/18 add Seroquel 25 mg every 12 hours as needed for agitation and wandering -Continue daytime Symmetrel  **PT NOTES FROM 2/14**  Patient received in bed, shaking head that he does not want to get up and walk. Patient requires multimodal cues to perform activities today. Requires mod/max assist for bed mobility and continually attempting to lie back down once seated. Patient required +2 max assist to transfer to recliner from bed. He will continue to benefit from skilled PT while here to improve functional independence and safety.    Situational depression -Started Celexa 2/16-will increase to 20 mg 2/18  Ischemic cardiomyopathy -Echo this admission EF 40% -Continue hydralazine and Isordil -No ACE inhibitor secondary to underlying kidney disease -Will need follow-up echo 3 months post initial echo which was completed on 07/23/2020  Hypertension -Was on Norvasc prior to admission -Continue cardiovascular meds as listed above  Acute on chronic kidney disease stage IIIb -Suspect prerenal etiology in the setting of hypoperfusion from ventricular fibrillation arrest -Baseline creatinine between 1.6 and 2; creatinine on 12/19 was 1.67-will repeat in a.m. on 08/19/2020 -Continue free water per tube and intermittently follow labs  Dysphagia, moderate protein calorie malnutrition Nutrition Problem: Inadequate oral intake Etiology: inability to eat Signs/Symptoms: NPO status  -Currently on nocturnal tube  feedings.  Since 2/4 patient has eaten between 60 and 95% of meals.  If this trend continues likely can transition to oral diet only. -1/24 Resumed nocturnal TFs been ongoing poor oral intake during the day -Currently on D2 diet with thin liquids -Repeat SLP eval on 2/8 continues to recommend D2 diet with thin liquids. Interventions: Tube feeding,Prostat Estimated body mass index is 25.47 kg/m as calculated from the following:   Height as of this encounter: 6' (1.829 m).   Weight as of this encounter: 85.2 kg.     Other problems: Acute hypernatremia -Continue free water per tube  E. coli UTI -Has completed 5 days of Bactrim  Hyperglycemia without an underlying diagnosis of diabetes mellitus Resolved -Likely related to acute stress -Recent issues with hypoglycemia so sliding scale insulin decreased to very sensitive and Levemir decreased from 30 twice daily to 15 twice daily as of 1/14 -Hemoglobin A1c 4.7   Acute hypoxemic respiratory failure -Resolved -Stable on room air with O2 sats between 97 and 99%  Nonsustained VT -Continue amiodarone and beta-blocker -1/31 LFTs normal  Skin tear right lateral back Wound / Incision (Open or Dehisced) 07/28/20 Skin tear Back Lateral;Right;Upper (Active)  Date First Assessed/Time First Assessed: 07/28/20 0300   Wound Type: Skin tear  Location: Back  Location Orientation: Lateral;Right;Upper  Present on Admission: No  Assessments 07/28/2020  7:49 PM 09/26/2020  9:35 PM  Dressing Type None Foam - Lift dressing to assess site every shift  Dressing Status -- None  Dressing Change Frequency -- PRN  Site / Wound Assessment -- Clean;Dry  % Wound base Red or Granulating -- 0%  % Wound base Yellow/Fibrinous Exudate -- 0%  % Wound base Black/Eschar -- 0%  % Wound base Other/Granulation Tissue (Comment) -- 0%  Peri-wound Assessment -- Intact  Wound Length (cm) -- 0 cm  Wound Width (cm) -- 0 cm  Wound Depth (cm) -- 0 cm  Wound Volume (cm^3)  -- 0 cm^3  Wound Surface Area (cm^2) -- 0 cm^2  Margins -- Attached edges (approximated)  Closure -- None  Drainage Amount None None  Drainage Description -- No odor  Non-staged Wound Description -- Not applicable  Treatment Cleansed Cleansed     No Linked orders to display    Data Reviewed: Basic Metabolic Panel: Recent Labs  Lab 10/05/20 0959  NA 138  K 4.0  CL 104  CO2 25  GLUCOSE 133*  BUN 14  CREATININE 1.25*  CALCIUM 9.1   Liver Function Tests: Recent Labs  Lab 10/05/20 0959  AST 21  ALT 33  ALKPHOS 71  BILITOT 0.7  PROT 6.7  ALBUMIN 3.1*   No results for input(s): LIPASE, AMYLASE in the last 168 hours. No results for input(s): AMMONIA in the last 168 hours. CBC: Recent Labs  Lab 10/05/20 0959  WBC 6.1  NEUTROABS 3.1  HGB 12.0*  HCT 35.8*  MCV 96.0  PLT 191   Cardiac Enzymes: No results for input(s): CKTOTAL, CKMB, CKMBINDEX, TROPONINI in the last 168 hours. BNP (last 3 results) No results for input(s): BNP in the last 8760 hours.  ProBNP (last 3 results) No results for input(s): PROBNP in the last 8760 hours.  CBG: Recent Labs  Lab 10/08/20 0612 10/08/20 1213 10/08/20 1840 10/09/20 0024 10/09/20 0602  GLUCAP 118* 81 168* 120* 123*    Recent Results (from the past 240 hour(s))  Culture, Urine     Status: None   Collection Time: 10/05/20 10:40 AM   Specimen: Urine, Catheterized  Result Value Ref Range Status   Specimen Description URINE, CATHETERIZED  Final   Special Requests NONE  Final   Culture   Final    NO GROWTH Performed at [facility], [place] [address], [place],  [address]    Report Status 10/06/2020 FINAL  Final     Studies: No results found.  Scheduled Meds: . amantadine  100 mg Per Tube Daily  . aspirin  81 mg Per Tube Daily  . atorvastatin  80 mg Per Tube Daily  . carvedilol  25 mg Per Tube BID WC  . chlorhexidine gluconate (MEDLINE KIT)  15 mL Mouth Rinse BID  . citalopram  20 mg Per Tube Daily   . clopidogrel  75 mg Per Tube Daily  . COVID-19 mRNA vaccine (Moderna)  0.25 mL Intramuscular Once  . enoxaparin (LOVENOX) injection  40 mg Subcutaneous Q24H  . feeding supplement (PROSource TF)  45 mL Per Tube BID  . free water  200 mL Per Tube Q2H  . hydrALAZINE  25 mg Per Tube Q8H  . insulin aspart  0-15 Units Subcutaneous Q6H  . isosorbide dinitrate  10 mg Per Tube TID  . mouth rinse  15 mL Mouth Rinse BID  . polyvinyl alcohol  1 drop Both Eyes QID  . QUEtiapine  50 mg  Per Tube Daily  . sodium chloride flush  3 mL Intravenous Q12H   Continuous Infusions: . feeding supplement (JEVITY 1.5 CAL/FIBER) 780 mL (10/07/20 2124)    Active Problems:   Acute ST elevation myocardial infarction (STEMI) (HCC)   Cardiac arrest (HCC)   Elevated blood pressure reading with diagnosis of hypertension   Shock circulatory (HCC)   Encephalopathy   History of ETT   Cardiomyopathy, ischemic   Anoxic brain damage ([place])   Dysphagia   DNR (do not resuscitate) discussion   Gait instability   E. coli UTI   Consultants:  Cardiology  Interventional radiology  PCCM  Neurology  Procedures:  Cardiac catheterization 12/4  Continuous EEG 12/4 and overnight EEG with video 12/5  Echocardiogram 12/5  EEG 12/9  Percutaneous PEG tube placement 12/30  Antibiotics: Anti-infectives (From admission, onward)   Start     Dose/Rate Route Frequency Ordered Stop   09/13/20 1000  sulfamethoxazole-trimethoprim (BACTRIM) 200-40 MG/5ML suspension 20 mL        20 mL Per Tube Every 12 hours 09/13/20 0734 09/19/20 0926   09/13/20 0745  cefTRIAXone (ROCEPHIN) 1 g in sodium chloride 0.9 % 100 mL IVPB  Status:  Discontinued        1 g 200 mL/hr over 30 Minutes Intravenous Every 24 hours 09/13/20 0658 09/13/20 0734   08/17/20 0000  ceFAZolin (ANCEF) IVPB 2g/100 mL premix        2 g 200 mL/hr over 30 Minutes Intravenous To Radiology 08/15/20 1346 08/17/20 1111   07/22/20 0830  cefTRIAXone (ROCEPHIN) 2 g in  sodium chloride 0.9 % 100 mL IVPB        2 g 200 mL/hr over 30 Minutes Intravenous Every 24 hours 07/22/20 0720 07/26/20 0858       Time spent: 20 minutes    Erin Hearing ANP  [facility] 7 am - 330 pm/M-F for direct patient care and secure chat Please refer to Amion for contact information 79 days

## 2020-10-09 NOTE — Plan of Care (Signed)
  Problem: Activity: Goal: Risk for activity intolerance will decrease Outcome: Progressing   

## 2020-10-09 NOTE — Progress Notes (Signed)
CSW spoke with Antonio Navarro at [facility] - facility unable to meet the patient's needs.  [name], MSW, LCSW Transitions of Care  Clinical Social Worker II [phone]

## 2020-10-10 LAB — GLUCOSE, CAPILLARY
Glucose-Capillary: 120 mg/dL — ABNORMAL HIGH (ref 70–99)
Glucose-Capillary: 136 mg/dL — ABNORMAL HIGH (ref 70–99)
Glucose-Capillary: 142 mg/dL — ABNORMAL HIGH (ref 70–99)
Glucose-Capillary: 168 mg/dL — ABNORMAL HIGH (ref 70–99)

## 2020-10-10 MED ORDER — QUETIAPINE FUMARATE 50 MG PO TABS
50.0000 mg | ORAL_TABLET | ORAL | Status: DC
  Administered 2020-10-10: 50 mg
  Filled 2020-10-10: qty 1

## 2020-10-10 NOTE — Progress Notes (Signed)
[facility] PROGRESS NOTE  Antonio Navarro:[id number] DOB: 10/11/1960 DOA: 07/22/2020 PCP: Patient, No Pcp Per       Status: Remains inpatient appropriate because:Altered mental status and Unsafe d/c plan   Dispo: The patient is from: Home              Anticipated d/c is to: SNF              Anticipated d/c date is: > 3 days              Patient currently is medically stable to d/c.  Barriers to discharge: Medicaid and disability pending. No SNF bed offers.  As of 2/18 patient has had increasing nocturnal agitation with wandering and has required as needed medications as well as safety sitter-evening dose of Seroquel has been adjusted and patient appears to have stable   Code Status: Full Family Communication:  DVT prophylaxis: sq heparin Vaccination status: Completed both doses of [place] Covid vaccination on 12/14/2019; no option for [employer] booster at this facility therefore [place] place to ordered on 09/21/2020 but was not given  Foley catheter: No  HPI: 60 year old male history of hypertension prescribed Norvasc prior to admission, chronic back pain, prior renal calculi who experienced an out of hospital cardiac arrest with downtime of 9 minutes.  Initial rhythm was ventricular fibrillation.  He was defibrillated deceived 6 doses of epi before ROSC.  Subsequent work-up revealed STEMI.  He underwent cardiac catheterization with a DES placed to the circumflex.  Cardiology following and has placed patient on Brilinta and aspirin and is also being treated with beta-blocker and statins.  From a neurological standpoint he clinically has anoxic brain injury.  During this hospitalization he experienced AKI and was treated with fluids and correction of hypoperfusion from cardiac arrest.  Echo performed this admission revealed ischemic cardiomyopathy with an EF of 40%  In addition to above patient has been unable to pass swallowing evaluations and subsequently has undergone percutaneous  PEG tube placement on 12/30.  Patient is awake and moving all extremities spontaneously but does not follow commands and does not verbalize palliative care has met with patient and family and has emphasized poor prognosis and will continue to meet with the family during the hospitalization.  Current plan is to transition to SNF bed once available.  Expect need for lifelong 24/7 care for all ADLs.  Subjective: Awakened.  No specific complaints.  Nursing states that tube feeding became disconnected and patient had line and tube feeding for an unknown amount of time and when they cleaned him up they noted some white patches on his back.  During exam patient without any complaints of pain during exam.  Objective: Vitals:   10/09/20 2040 10/10/20 0702  BP: (!) 150/92 (!) 144/81  Pulse: 84 68  Resp: 17 18  Temp:  97.9 F (36.6 C)  SpO2: 100% 97%    Intake/Output Summary (Last 24 hours) at 10/10/2020 0810 Last data filed at 10/10/2020 0600 Gross per 24 hour  Intake 2765 ml  Output --  Net 2765 ml   Filed Weights   10/09/20 0500 10/09/20 0547 10/10/20 0314  Weight: 86.8 kg 85.2 kg 86.5 kg    Exam: Constitutional: Awake, calm Heart: Heart sounds are normal, pulses regular and nontachycardic, extremities warm to touch Respiratory: Posterior lung sounds clear and he is stable on room air Abdomen: Abdomen soft nontender with normoactive bowel sounds on D2 diet -PEG tube for LBM 2/21 Neurologic: CN 2-12 roughly  without any appreciable focal neurological deficits, MOE x4, strength is 5/5.   Psychiatric: Awake.  Oriented times name.  Aware he is not at home and intermittently oriented to hospital.  Not to year.  Pleasant affect. Skin: Multiple flat white patches on left side of back extending down to buttocks up to the mid back.  Have appearance similar to vitiligo but not present on admission.  These areas are nontender, nonswollen and no associated erythema or drainage.  Patient does have some  associated papular lesions on the upper buttocks just below these white areas.   Assessment/Plan: Acute problems: Out of hospital cardiac arrest secondary to STEMI -ROSC and subsequent catheterization revealed disease in circumflex requiring DES -Continue beta-blocker, statin, Plavix, isosorbide and hydralazine  Significant anoxic brain injury secondary to cardiac arrest/deconditioning/gait instability -Consistently participating with PT and OT.  SLP has cleared for dysphagia 2 with thin liquids -Declined by CIR since family unable to provide 24/7 care.  Plan is for SNF -Continue Seroquel 100 mg at at bedtime -2/18 add Seroquel 25 mg every 12 hours as needed for agitation and wandering -Continue daytime Symmetrel  **PT NOTES FROM 2/21**  Pt progressing well towards his physical therapy goals; smiling occasionally and more talkative, although at times with unintelligible speech. Pt with urinary incontinence upon arrival. Assisted onto [facility] to get cleaned up. Then ambulating x 280 feet with a walker, min assist and close chair follow. Continues with cognitive deficits, balance impairments, and weakness. Continue to recommend SNF for ongoing Physical Therapy.   White lesions/rash on back and buttocks -Appeared after patient had laying in tube feeding    10/10/20                           10/10/20 -We will continue to monitor any evolution of these lesions or changes document accordingly  Situational depression -Started Celexa 2/16-increased to 20 mg 2/18  Ischemic cardiomyopathy -Echo this admission EF 40% -Continue hydralazine and Isordil -No ACE inhibitor secondary to underlying kidney disease -Will need follow-up echo 3 months post initial echo which was completed on 07/23/2020  Hypertension -Was on Norvasc prior to admission -Continue cardiovascular meds as listed above  Acute on chronic kidney disease stage IIIb -Suspect prerenal etiology in the setting of hypoperfusion from  ventricular fibrillation arrest -Baseline creatinine between 1.6 and 2; creatinine on 12/19 was 1.67-will repeat in a.m. on 08/19/2020 -Continue free water per tube and intermittently follow labs  Dysphagia, moderate protein calorie malnutrition Nutrition Problem: Inadequate oral intake Etiology: inability to eat Signs/Symptoms: NPO status  -Currently on nocturnal tube feedings.  Since 2/4 patient has eaten between 60 and 95% of meals.  If this trend continues likely can transition to oral diet only. -1/24 Resumed nocturnal TFs been ongoing poor oral intake during the day -Currently on D2 diet with thin liquids -Repeat SLP eval on 2/8 continues to recommend D2 diet with thin liquids. Interventions: Tube feeding,Prostat Estimated body mass index is 25.86 kg/m as calculated from the following:   Height as of this encounter: 6' (1.829 m).   Weight as of this encounter: 86.5 kg.     Other problems: Acute hypernatremia -Continue free water per tube  E. coli UTI -Has completed 5 days of Bactrim  Hyperglycemia without an underlying diagnosis of diabetes mellitus Resolved -Likely related to acute stress -Recent issues with hypoglycemia so sliding scale insulin decreased to very sensitive and Levemir decreased from 30 twice daily to 15 twice  daily as of 1/14 -Hemoglobin A1c 4.7   Acute hypoxemic respiratory failure -Resolved -Stable on room air with O2 sats between 97 and 99%  Nonsustained VT -Continue amiodarone and beta-blocker -1/31 LFTs normal  Skin tear right lateral back Wound / Incision (Open or Dehisced) 07/28/20 Skin tear Back Lateral;Right;Upper (Active)  Date First Assessed/Time First Assessed: 07/28/20 0300   Wound Type: Skin tear  Location: Back  Location Orientation: Lateral;Right;Upper  Present on Admission: No    Assessments 07/28/2020  7:49 PM 09/26/2020  9:35 PM  Dressing Type None Foam - Lift dressing to assess site every shift  Dressing Status -- None  Dressing  Change Frequency -- PRN  Site / Wound Assessment -- Clean;Dry  % Wound base Red or Granulating -- 0%  % Wound base Yellow/Fibrinous Exudate -- 0%  % Wound base Black/Eschar -- 0%  % Wound base Other/Granulation Tissue (Comment) -- 0%  Peri-wound Assessment -- Intact  Wound Length (cm) -- 0 cm  Wound Width (cm) -- 0 cm  Wound Depth (cm) -- 0 cm  Wound Volume (cm^3) -- 0 cm^3  Wound Surface Area (cm^2) -- 0 cm^2  Margins -- Attached edges (approximated)  Closure -- None  Drainage Amount None None  Drainage Description -- No odor  Non-staged Wound Description -- Not applicable  Treatment Cleansed Cleansed     No Linked orders to display    Data Reviewed: Basic Metabolic Panel: Recent Labs  Lab 10/05/20 0959  NA 138  K 4.0  CL 104  CO2 25  GLUCOSE 133*  BUN 14  CREATININE 1.25*  CALCIUM 9.1   Liver Function Tests: Recent Labs  Lab 10/05/20 0959  AST 21  ALT 33  ALKPHOS 71  BILITOT 0.7  PROT 6.7  ALBUMIN 3.1*   No results for input(s): LIPASE, AMYLASE in the last 168 hours. No results for input(s): AMMONIA in the last 168 hours. CBC: Recent Labs  Lab 10/05/20 0959  WBC 6.1  NEUTROABS 3.1  HGB 12.0*  HCT 35.8*  MCV 96.0  PLT 191   Cardiac Enzymes: No results for input(s): CKTOTAL, CKMB, CKMBINDEX, TROPONINI in the last 168 hours. BNP (last 3 results) No results for input(s): BNP in the last 8760 hours.  ProBNP (last 3 results) No results for input(s): PROBNP in the last 8760 hours.  CBG: Recent Labs  Lab 10/09/20 0024 10/09/20 0602 10/09/20 1551 10/10/20 0009 10/10/20 0641  GLUCAP 120* 123* 128* 136* 168*    Recent Results (from the past 240 hour(s))  Culture, Urine     Status: None   Collection Time: 10/05/20 10:40 AM   Specimen: Urine, Catheterized  Result Value Ref Range Status   Specimen Description URINE, CATHETERIZED  Final   Special Requests NONE  Final   Culture   Final    NO GROWTH Performed at [place]  [address], [place], [place] [address]    Report Status 10/06/2020 FINAL  Final     Studies: No results found.  Scheduled Meds: . amantadine  100 mg Per Tube Daily  . aspirin  81 mg Per Tube Daily  . atorvastatin  80 mg Per Tube Daily  . carvedilol  25 mg Per Tube BID WC  . chlorhexidine gluconate (MEDLINE KIT)  15 mL Mouth Rinse BID  . citalopram  20 mg Per Tube Daily  . clopidogrel  75 mg Per Tube Daily  . COVID-19 mRNA vaccine (Moderna)  0.25 mL Intramuscular Once  . enoxaparin (LOVENOX)  injection  40 mg Subcutaneous Q24H  . feeding supplement (PROSource TF)  45 mL Per Tube BID  . free water  200 mL Per Tube Q2H  . hydrALAZINE  25 mg Per Tube Q8H  . insulin aspart  0-15 Units Subcutaneous Q6H  . isosorbide dinitrate  10 mg Per Tube TID  . mouth rinse  15 mL Mouth Rinse BID  . polyvinyl alcohol  1 drop Both Eyes QID  . QUEtiapine  100 mg Per Tube Daily  . sodium chloride flush  3 mL Intravenous Q12H   Continuous Infusions: . feeding supplement (JEVITY 1.5 CAL/FIBER) 780 mL (10/07/20 2124)    Active Problems:   Acute ST elevation myocardial infarction (STEMI) (HCC)   Cardiac arrest (HCC)   Elevated blood pressure reading with diagnosis of hypertension   Shock circulatory (HCC)   Encephalopathy   History of ETT   Cardiomyopathy, ischemic   Anoxic brain damage ([place])   Dysphagia   DNR (do not resuscitate) discussion   Gait instability   E. coli UTI   Consultants:  Cardiology  Interventional radiology  PCCM  Neurology  Procedures:  Cardiac catheterization 12/4  Continuous EEG 12/4 and overnight EEG with video 12/5  Echocardiogram 12/5  EEG 12/9  Percutaneous PEG tube placement 12/30  Antibiotics: Anti-infectives (From admission, onward)   Start     Dose/Rate Route Frequency Ordered Stop   09/13/20 1000  sulfamethoxazole-trimethoprim (BACTRIM) 200-40 MG/5ML suspension 20 mL        20 mL Per Tube Every 12 hours 09/13/20 0734 09/19/20 0926   09/13/20 0745   cefTRIAXone (ROCEPHIN) 1 g in sodium chloride 0.9 % 100 mL IVPB  Status:  Discontinued        1 g 200 mL/hr over 30 Minutes Intravenous Every 24 hours 09/13/20 0658 09/13/20 0734   08/17/20 0000  ceFAZolin (ANCEF) IVPB 2g/100 mL premix        2 g 200 mL/hr over 30 Minutes Intravenous To Radiology 08/15/20 1346 08/17/20 1111   07/22/20 0830  cefTRIAXone (ROCEPHIN) 2 g in sodium chloride 0.9 % 100 mL IVPB        2 g 200 mL/hr over 30 Minutes Intravenous Every 24 hours 07/22/20 0720 07/26/20 0858       Time spent: 20 minutes    Erin Hearing ANP  [facility] 7 am - 330 pm/M-F for direct patient care and secure chat Please refer to Amion for contact information 80 days

## 2020-10-10 NOTE — Progress Notes (Signed)
  Speech Language Pathology Treatment: Dysphagia;Cognitive-Linquistic  Patient Details Name: Antonio Navarro MRN: [id number] DOB: 03/31/1961 Today's Date: 10/10/2020 Time: 8657-8469 SLP Time Calculation (min) (ACUTE ONLY): 16 min  Assessment / Plan / Recommendation Clinical Impression  Pt observed with lunch tray and liquids with no overt s/s of aspiration; however, PO trials were limited due to pt's current lethargic state which may be medication related per RN. Pt continues to demonstrate prolonged mastication even with small bites today which may have been exacerbated by his mentation. Pt responded to a few yes/no questions by shaking his head. Overall, treatment for dysphagia and cognition/communication was limited due to pt's mentation despite encouragement from SLP - will continue to follow up.    HPI HPI: Pt is a 60 yo male s/p arrest with downtime 9 minutes. Initial rhythm was V. fib, shocked and received 6 rounds of epi before ROSC achieved.  Total CPR time was 30 minutes. ETT 12/4-12/13. MRI 12/7 suggestive of a widespread anoxic injury. PMH includes kidney stones and HTN.      SLP Plan  Continue with current plan of care       Recommendations  Diet recommendations: Dysphagia 2 (fine chop);Thin liquid Liquids provided via: Cup;Straw Medication Administration: Crushed with puree Supervision: Staff to assist with self feeding;Full supervision/cueing for compensatory strategies Compensations: Slow rate;Small sips/bites;Minimize environmental distractions Postural Changes and/or Swallow Maneuvers: Seated upright 90 degrees                Oral Care Recommendations: Oral care BID Follow up Recommendations: Skilled Nursing facility SLP Visit Diagnosis: Dysphagia, oral phase (R13.11) Plan: Continue with current plan of care       GO                Jeanine Luz., SLP Student 10/10/2020, 2:01 PM

## 2020-10-10 NOTE — Plan of Care (Signed)
  Problem: Activity: Goal: Risk for activity intolerance will decrease Outcome: Progressing   Problem: Coping: Goal: Level of anxiety will decrease Outcome: Progressing   

## 2020-10-10 NOTE — Progress Notes (Signed)
Area of white raised patches located on pts. right side back and buttocks. On coming RN made aware.

## 2020-10-11 LAB — GLUCOSE, CAPILLARY
Glucose-Capillary: 114 mg/dL — ABNORMAL HIGH (ref 70–99)
Glucose-Capillary: 126 mg/dL — ABNORMAL HIGH (ref 70–99)
Glucose-Capillary: 142 mg/dL — ABNORMAL HIGH (ref 70–99)
Glucose-Capillary: 149 mg/dL — ABNORMAL HIGH (ref 70–99)
Glucose-Capillary: 154 mg/dL — ABNORMAL HIGH (ref 70–99)

## 2020-10-11 MED ORDER — QUETIAPINE FUMARATE 25 MG PO TABS
25.0000 mg | ORAL_TABLET | ORAL | Status: DC
  Administered 2020-10-11 – 2020-10-17 (×7): 25 mg
  Filled 2020-10-11 (×7): qty 1

## 2020-10-11 NOTE — Progress Notes (Signed)
Occupational Therapy Treatment Patient Details Name: KAHNE HELFAND MRN: [id number] DOB: 04-13-1961 Today's Date: 10/11/2020    History of present illness [name] with history of tobacco abuse, obesity, HTN who was admitted 07/22/20 post cardiac arrest with inferolateral STEMI. He was resuscitated in the field by EMS after being found to be in V fib. Approximately 30 minutes of CPR. He was intubated on arrival.  Pt with anoxic brain injury. PEG placed on 08/17/20.   OT comments  Pt making steady progress towards OT goals this session. Session focus on functional mobility and BADL reeducation. Pt continues to present with impaired balance, decreased activity tolerance and cognitive deficits, however pt making excellent progress with following commands during ADL participation and functional mobility. Pt completed household distance functional mobility within pts room with RW and MIN A +2 mostly for safety, pt requires assist to manage RW at times especially when navigating around obstacles. Pt completed standing ADLs at sink with min guard for balance, pt initially required hand over hand assist to initiate hand washing however pt progressing to only needing verbal cues to sequence task. Pt would continue to benefit from skilled occupational therapy while admitted and after d/c to address the below listed limitations in order to improve overall functional mobility and facilitate independence with BADL participation. DC plan remains appropriate, will follow acutely per POC.     Follow Up Recommendations  SNF;Supervision/Assistance - 24 hour    Equipment Recommendations  Wheelchair (measurements OT);Wheelchair cushion (measurements OT);Hospital bed;3 in 1 bedside commode    Recommendations for Other Services      Precautions / Restrictions Precautions Precautions: Fall Precaution Comments: g tube Restrictions Weight Bearing Restrictions: No       Mobility Bed Mobility Overal bed  mobility: Needs Assistance Bed Mobility: Supine to Sit     Supine to sit: Mod assist     General bed mobility comments: MOD A to elevate trunk and fully advance BLEs to EOB    Transfers Overall transfer level: Needs assistance Equipment used: Rolling walker (2 wheeled) Transfers: Sit to/from Stand Sit to Stand: Min assist;+2 physical assistance         General transfer comment: MIN A +2 to rise from EOB with RW and initial steadying assist    Balance Overall balance assessment: Needs assistance Sitting-balance support: Feet supported Sitting balance-Leahy Scale: Good Sitting balance - Comments: sitting EOB with supervision with no LOB   Standing balance support: Bilateral upper extremity supported;During functional activity;Single extremity supported Standing balance-Leahy Scale: Poor Standing balance comment: at least one UE supported during ADLs and functional mobility                           ADL either performed or assessed with clinical judgement   ADL Overall ADL's : Needs assistance/impaired     Grooming: Wash/dry hands;Standing;Min guard;Cueing for sequencing Grooming Details (indicate cue type and reason): pt able to stand at sink to wash hands with minguard assist +2 for safety for balance, pt required initial hand over hand assist to initiate task but progressed to min guard and verbal cues for sequencing task         Upper Body Dressing : Minimal assistance;Sitting Upper Body Dressing Details (indicate cue type and reason): to don gown as back side cover, pt initiating threading arms this sesssion with only one verbal cue     Toilet Transfer: Minimal assistance;+2 for physical assistance;+2 for safety/equipment;Ambulation;RW Armed forces technical officer  Details (indicate cue type and reason): simulated via functional mobility with RW and MIN A +2 mostly for safety, assist at times needed for RW mgmt         Functional mobility during ADLs: +2 for  physical assistance;Minimal assistance;+2 for safety/equipment General ADL Comments: pt continues to make gains in following commands and completing functional tasks with less cues each session. pt initially needed hand over hand assist to initiate completing hand washing task but then able to follow all verbal cues for sequencing steps.     Vision       Perception     Praxis      Cognition Arousal/Alertness: Awake/alert Behavior During Therapy: WFL for tasks assessed/performed Overall Cognitive Status: Impaired/Different from baseline Area of Impairment: Attention;Following commands;Problem solving;Awareness;Safety/judgement                   Current Attention Level: Selective   Following Commands: Follows one step commands consistently;Follows multi-step commands with increased time Safety/Judgement: Decreased awareness of safety Awareness: Intellectual Problem Solving: Slow processing;Difficulty sequencing;Decreased initiation;Requires verbal cues;Requires tactile cues General Comments: pt making steady progress with following commands and remains attentive during session, giving COTA high five and saying "bye" at end of session        Exercises     Shoulder Instructions       General Comments pts fiance present during session    Pertinent Vitals/ Pain       Pain Assessment: Faces Faces Pain Scale: No hurt  Home Living                                          Prior Functioning/Environment              Frequency  Min 2X/week        Progress Toward Goals  OT Goals(current goals can now be found in the care plan section)  Progress towards OT goals: Progressing toward goals  Acute Rehab OT Goals Time For Goal Achievement: 10/12/20 Potential to Achieve Goals: [place] Discharge plan remains appropriate;Frequency remains appropriate    Co-evaluation                 AM-PAC OT "6 Clicks" Daily Activity     Outcome  Measure   Help from another person eating meals?: A Little Help from another person taking care of personal grooming?: A Little Help from another person toileting, which includes using toliet, bedpan, or urinal?: A Lot Help from another person bathing (including washing, rinsing, drying)?: A Lot Help from another person to put on and taking off regular upper body clothing?: A Little Help from another person to put on and taking off regular lower body clothing?: A Lot 6 Click Score: 15    End of Session Equipment Utilized During Treatment: Gait belt;Rolling walker  OT Visit Diagnosis: Muscle weakness (generalized) (M62.81);Pain;Other abnormalities of gait and mobility (R26.89);Cognitive communication deficit (R41.841);Low vision, both eyes (H54.2) Symptoms and signs involving cognitive functions: Other Nontraumatic ICH   Activity Tolerance Patient tolerated treatment well   Patient Left in chair;with call bell/phone within reach;with family/visitor present   Nurse Communication Mobility status        Time: 1203-1216 OT Time Calculation (min): 13 min  Charges: OT General Charges $OT Visit: 1 Visit OT Treatments $Self Care/Home Management : 8-22 mins  [name]., COTA/L Acute Rehabilitation Services  [place] Maxie Debose 10/11/2020, 1:11 PM

## 2020-10-11 NOTE — Plan of Care (Signed)
  Problem: Nutrition: Goal: Adequate nutrition will be maintained Outcome: Progressing   

## 2020-10-11 NOTE — Progress Notes (Addendum)
[facility] PROGRESS NOTE  Antonio Navarro BHA:[id number] DOB: 01-Oct-1960 DOA: 07/22/2020 PCP: Patient, No Pcp Per       Status: Remains inpatient appropriate because:Altered mental status and Unsafe d/c plan   Dispo: The patient is from: Home              Anticipated d/c is to: SNF              Anticipated d/c date is: > 3 days              Patient currently is medically stable to d/c.  Barriers to discharge: Medicaid and disability pending. No SNF bed offers.  As of 2/18 patient has had increasing nocturnal agitation with wandering and has required as needed medications as well as safety sitter-evening dose of Seroquel has been adjusted and patient appears to have stable   Code Status: Full Family Communication: VM left for sister on 2/23 explaining plans to decrease p.m. Seroquel dose due to her concerns over excessive a.m. sleepiness. DVT prophylaxis: sq heparin Vaccination status: Completed both doses of Pfizer Covid vaccination on 12/14/2019; no option for [employer] booster at this facility therefore [place] [facility] to ordered on 09/21/2020 but was not given  Foley catheter: No  HPI: 60 year old male history of hypertension prescribed Norvasc prior to admission, chronic back pain, prior renal calculi who experienced an out of hospital cardiac arrest with downtime of 9 minutes.  Initial rhythm was ventricular fibrillation.  He was defibrillated deceived 6 doses of epi before ROSC.  Subsequent work-up revealed STEMI.  He underwent cardiac catheterization with a DES placed to the circumflex.  Cardiology following and has placed patient on Brilinta and aspirin and is also being treated with beta-blocker and statins.  From a neurological standpoint he clinically has anoxic brain injury.  During this hospitalization he experienced AKI and was treated with fluids and correction of hypoperfusion from cardiac arrest.  Echo performed this admission revealed ischemic cardiomyopathy with an EF of  40%  In addition to above patient has been unable to pass swallowing evaluations and subsequently has undergone percutaneous PEG tube placement on 12/30.  Patient is awake and moving all extremities spontaneously but does not follow commands and does not verbalize palliative care has met with patient and family and has emphasized poor prognosis and will continue to meet with the family during the hospitalization.  Current plan is to transition to SNF bed once available.  Expect need for lifelong 24/7 care for all ADLs.  Subjective: Awakened.  No complaints verbalized.  Not as interactive today.  Objective: Vitals:   10/10/20 1959 10/11/20 0325  BP: 117/89 121/85  Pulse: 88 79  Resp: 16 16  Temp: 97.6 F (36.4 C) 98.2 F (36.8 C)  SpO2:  99%    Intake/Output Summary (Last 24 hours) at 10/11/2020 0759 Last data filed at 10/11/2020 0500 Gross per 24 hour  Intake 2425 ml  Output 620 ml  Net 1805 ml   Filed Weights   10/09/20 0547 10/10/20 0314 10/11/20 0208  Weight: 85.2 kg 86.5 kg 85.5 kg    Exam: Constitutional: Awake, calm in no acute distress Heart: Heart sounds are S1-S2.  Pulse regular and nontachycardic.  Extremities warm Respiratory: Posterior lung sounds are clear to auscultation, no increased work of breathing.  Stable on room air Abdomen:  D2 diet with marginal intake-PEG tube for nocturnal feedings.  LBM 2/21 Neurologic: CN 2-12 roughly without any appreciable focal neurological deficits, MOE x4, strength is 5/5.  Psychiatric:  Skin: Multiple flat white patches on left side of back extending down to buttocks up to the mid back.  Have appearance similar to vitiligo but not present on admission.  These are becoming dry to the touch.  Some of the lesions appear to have minor perilesion induration that does not involve erythema or drainage.  These areas are nontender, nonswollen and no associated erythema or drainage.  Patient does have some associated papular lesions on the  upper buttocks just below these white areas.  Essentially no significant change in the lesions in the past 24 h.   Assessment/Plan: Acute problems: Out of hospital cardiac arrest secondary to STEMI -ROSC and subsequent catheterization revealed disease in circumflex requiring DES -Continue beta-blocker, statin, Plavix, isosorbide and hydralazine  Significant anoxic brain injury secondary to cardiac arrest/deconditioning/gait instability -Consistently participating with PT and OT.  SLP has cleared for dysphagia 2 with thin liquids -Declined by CIR since family unable to provide 24/7 care.  Plan is for SNF -Continue Seroquel 100 mg at at bedtime -2/18 add Seroquel 25 mg every 12 hours as needed for agitation and wandering -Continue daytime Symmetrel  **PT NOTES FROM 2/21**  Pt progressing well towards his physical therapy goals; smiling occasionally and more talkative, although at times with unintelligible speech. Pt with urinary incontinence upon arrival. Assisted onto [facility] to get cleaned up. Then ambulating x 280 feet with a walker, min assist and close chair follow. Continues with cognitive deficits, balance impairments, and weakness. Continue to recommend SNF for ongoing Physical Therapy.   White lesions/rash on back and buttocks -Appeared after patient had laying in tube feeding    10/10/20                           10/10/20 -We will continue to monitor any evolution of these lesions or changes document accordingly  Situational depression -Started Celexa 2/16-increased to 20 mg 2/18  Ischemic cardiomyopathy -Echo this admission EF 40% -Continue hydralazine and Isordil -No ACE inhibitor secondary to underlying kidney disease -Will need follow-up echo 3 months post initial echo which was completed on 07/23/2020  Hypertension -Was on Norvasc prior to admission -Continue cardiovascular meds as listed above  Acute on chronic kidney disease stage IIIb -Suspect prerenal etiology in the  setting of hypoperfusion from ventricular fibrillation arrest -Baseline creatinine between 1.6 and 2; creatinine on 12/19 was 1.67-will repeat in a.m. on 08/19/2020 -Continue free water per tube and intermittently follow labs  Dysphagia, moderate protein calorie malnutrition Nutrition Problem: Inadequate oral intake Etiology: inability to eat Signs/Symptoms: NPO status  -Currently on nocturnal tube feedings.  Since 2/4 patient has eaten between 60 and 95% of meals.  If this trend continues likely can transition to oral diet only. -1/24 Resumed nocturnal TFs been ongoing poor oral intake during the day -Currently on D2 diet with thin liquids -Repeat SLP eval on 2/8 continues to recommend D2 diet with thin liquids. Interventions: Tube feeding,Prostat Estimated body mass index is 25.56 kg/m as calculated from the following:   Height as of this encounter: 6' (1.829 m).   Weight as of this encounter: 85.5 kg.     Other problems: Acute hypernatremia -Continue free water per tube  E. coli UTI -Has completed 5 days of Bactrim  Hyperglycemia without an underlying diagnosis of diabetes mellitus Resolved -Likely related to acute stress -Recent issues with hypoglycemia so sliding scale insulin decreased to very sensitive and Levemir decreased from 30 twice daily  to 15 twice daily as of 1/14 -Hemoglobin A1c 4.7   Acute hypoxemic respiratory failure -Resolved -Stable on room air with O2 sats between 97 and 99%  Nonsustained VT -Continue amiodarone and beta-blocker -1/31 LFTs normal  Skin tear right lateral back Wound / Incision (Open or Dehisced) 07/28/20 Skin tear Back Lateral;Right;Upper (Active)  Date First Assessed/Time First Assessed: 07/28/20 0300   Wound Type: Skin tear  Location: Back  Location Orientation: Lateral;Right;Upper  Present on Admission: No    Assessments 07/28/2020  7:49 PM 09/26/2020  9:35 PM  Dressing Type None Foam - Lift dressing to assess site every shift   Dressing Status - None  Dressing Change Frequency - PRN  Site / Wound Assessment - Clean;Dry  % Wound base Red or Granulating - 0%  % Wound base Yellow/Fibrinous Exudate - 0%  % Wound base Black/Eschar - 0%  % Wound base Other/Granulation Tissue (Comment) - 0%  Peri-wound Assessment - Intact  Wound Length (cm) - 0 cm  Wound Width (cm) - 0 cm  Wound Depth (cm) - 0 cm  Wound Volume (cm^3) - 0 cm^3  Wound Surface Area (cm^2) - 0 cm^2  Margins - Attached edges (approximated)  Closure - None  Drainage Amount None None  Drainage Description - No odor  Non-staged Wound Description - Not applicable  Treatment Cleansed Cleansed     No Linked orders to display    Data Reviewed: Basic Metabolic Panel: Recent Labs  Lab 10/05/20 0959  NA 138  K 4.0  CL 104  CO2 25  GLUCOSE 133*  BUN 14  CREATININE 1.25*  CALCIUM 9.1   Liver Function Tests: Recent Labs  Lab 10/05/20 0959  AST 21  ALT 33  ALKPHOS 71  BILITOT 0.7  PROT 6.7  ALBUMIN 3.1*   No results for input(s): LIPASE, AMYLASE in the last 168 hours. No results for input(s): AMMONIA in the last 168 hours. CBC: Recent Labs  Lab 10/05/20 0959  WBC 6.1  NEUTROABS 3.1  HGB 12.0*  HCT 35.8*  MCV 96.0  PLT 191   Cardiac Enzymes: No results for input(s): CKTOTAL, CKMB, CKMBINDEX, TROPONINI in the last 168 hours. BNP (last 3 results) No results for input(s): BNP in the last 8760 hours.  ProBNP (last 3 results) No results for input(s): PROBNP in the last 8760 hours.  CBG: Recent Labs  Lab 10/10/20 0641 10/10/20 1104 10/10/20 1600 10/11/20 0005 10/11/20 0604  GLUCAP 168* 142* 120* 149* 142*    Recent Results (from the past 240 hour(s))  Culture, Urine     Status: None   Collection Time: 10/05/20 10:40 AM   Specimen: Urine, Catheterized  Result Value Ref Range Status   Specimen Description URINE, CATHETERIZED  Final   Special Requests NONE  Final   Culture   Final    NO GROWTH Performed at [facility], [place] [address] [place], [place] [address]    Report Status 10/06/2020 FINAL  Final     Studies: No results found.  Scheduled Meds: . amantadine  100 mg Per Tube Daily  . aspirin  81 mg Per Tube Daily  . atorvastatin  80 mg Per Tube Daily  . carvedilol  25 mg Per Tube BID WC  . chlorhexidine gluconate (MEDLINE KIT)  15 mL Mouth Rinse BID  . citalopram  20 mg Per Tube Daily  . clopidogrel  75 mg Per Tube Daily  . COVID-19 mRNA vaccine (Moderna)  0.25 mL Intramuscular Once  .  enoxaparin (LOVENOX) injection  40 mg Subcutaneous Q24H  . feeding supplement (PROSource TF)  45 mL Per Tube BID  . free water  200 mL Per Tube Q2H  . hydrALAZINE  25 mg Per Tube Q8H  . insulin aspart  0-15 Units Subcutaneous Q6H  . isosorbide dinitrate  10 mg Per Tube TID  . mouth rinse  15 mL Mouth Rinse BID  . polyvinyl alcohol  1 drop Both Eyes QID  . QUEtiapine  50 mg Per Tube Daily  . sodium chloride flush  3 mL Intravenous Q12H   Continuous Infusions: . feeding supplement (JEVITY 1.5 CAL/FIBER) 780 mL (10/07/20 2124)    Active Problems:   Acute ST elevation myocardial infarction (STEMI) (HCC)   Cardiac arrest (HCC)   Elevated blood pressure reading with diagnosis of hypertension   Shock circulatory (HCC)   Encephalopathy   History of ETT   Cardiomyopathy, ischemic   Anoxic brain damage ([place])   Dysphagia   DNR (do not resuscitate) discussion   Gait instability   E. coli UTI   Consultants:  Cardiology  Interventional radiology  PCCM  Neurology  Procedures:  Cardiac catheterization 12/4  Continuous EEG 12/4 and overnight EEG with video 12/5  Echocardiogram 12/5  EEG 12/9  Percutaneous PEG tube placement 12/30  Antibiotics: Anti-infectives (From admission, onward)   Start     Dose/Rate Route Frequency Ordered Stop   09/13/20 1000  sulfamethoxazole-trimethoprim (BACTRIM) 200-40 MG/5ML suspension 20 mL        20 mL Per Tube Every 12 hours 09/13/20 0734 09/19/20  0926   09/13/20 0745  cefTRIAXone (ROCEPHIN) 1 g in sodium chloride 0.9 % 100 mL IVPB  Status:  Discontinued        1 g 200 mL/hr over 30 Minutes Intravenous Every 24 hours 09/13/20 0658 09/13/20 0734   08/17/20 0000  ceFAZolin (ANCEF) IVPB 2g/100 mL premix        2 g 200 mL/hr over 30 Minutes Intravenous To Radiology 08/15/20 1346 08/17/20 1111   07/22/20 0830  cefTRIAXone (ROCEPHIN) 2 g in sodium chloride 0.9 % 100 mL IVPB        2 g 200 mL/hr over 30 Minutes Intravenous Every 24 hours 07/22/20 0720 07/26/20 0858       Time spent: 20 minutes    Erin Hearing ANP  [facility] 7 am - 330 pm/M-F for direct patient care and secure chat Please refer to Amion for contact information 81 days

## 2020-10-11 NOTE — Progress Notes (Signed)
Physical Therapy Treatment Patient Details Name: Antonio Navarro MRN: [id number] DOB: 1960-12-13 Today's Date: 10/11/2020    History of Present Illness [name] with history of tobacco abuse, obesity, HTN who was admitted 07/22/20 post cardiac arrest with inferolateral STEMI. He was resuscitated in the field by EMS after being found to be in V fib. Approximately 30 minutes of CPR. He was intubated on arrival.  Pt with anoxic brain injury. PEG placed on 08/17/20.    PT Comments    Pt progressing steadily towards his physical therapy goals. Pt with increased verbalizations; requiring repetition at times to follow and maintain attention or following commands during tasks. Pt requiring min assist for functional mobility. Ambulating x 300 feet with a walker. Pt continues with weakness, poor standing balance, cognitive deficits. Continue to recommend SNF for ongoing Physical Therapy.      Follow Up Recommendations  SNF;Supervision/Assistance - 24 hour     Equipment Recommendations  Rolling walker with 5" wheels;3in1 (PT);Wheelchair (measurements PT);Wheelchair cushion (measurements PT)    Recommendations for Other Services       Precautions / Restrictions Precautions Precautions: Fall Precaution Comments: g tube Restrictions Weight Bearing Restrictions: No    Mobility  Bed Mobility Overal bed mobility: Needs Assistance Bed Mobility: Sit to Supine     Supine to sit: Mod assist Sit to supine: Min assist   General bed mobility comments: MinA for LE management back into bed    Transfers Overall transfer level: Needs assistance Equipment used: Rolling walker (2 wheeled) Transfers: Sit to/from Stand Sit to Stand: Min assist         General transfer comment: MinA to rise from edge of bed and toilet  Ambulation/Gait Ambulation/Gait assistance: Min assist Gait Distance (Feet): 300 Feet Assistive device: Rolling walker (2 wheeled) Gait Pattern/deviations: Narrow base of  support;Decreased stride length;Step-through pattern Gait velocity: decreased   General Gait Details: Cues for upward gaze, manual assist required at times for steering walker, minA for balance   Stairs             Wheelchair Mobility    Modified Rankin (Stroke Patients Only)       Balance Overall balance assessment: Needs assistance Sitting-balance support: Feet supported Sitting balance-Leahy Scale: Good Sitting balance - Comments: sitting EOB with supervision with no LOB   Standing balance support: Bilateral upper extremity supported Standing balance-Leahy Scale: Poor Standing balance comment: at least one UE supported during ADLs and functional mobility                            Cognition Arousal/Alertness: Awake/alert Behavior During Therapy: WFL for tasks assessed/performed Overall Cognitive Status: Impaired/Different from baseline Area of Impairment: Attention;Following commands;Problem solving;Awareness;Safety/judgement                 Orientation Level: Disoriented to;Place;Time;Situation Current Attention Level: Selective Memory: Decreased short-term memory;Decreased recall of precautions Following Commands: Follows one step commands consistently;Follows multi-step commands with increased time Safety/Judgement: Decreased awareness of safety Awareness: Intellectual Problem Solving: Slow processing;Difficulty sequencing;Decreased initiation;Requires verbal cues;Requires tactile cues General Comments: Pt with increased verbalizations, requires repetition for following some one step commands, smiling intermittently      Exercises      General Comments General comments (skin integrity, edema, etc.): pts fiance present during session      Pertinent Vitals/Pain Pain Assessment: Faces Faces Pain Scale: No hurt    Home Living  Prior Function            PT Goals (current goals can now be found in the  care plan section) Acute Rehab PT Goals Patient Stated Goal: go home PT Goal Formulation: With patient Time For Goal Achievement: 10/23/20 Potential to Achieve Goals: Fair Progress towards PT goals: Progressing toward goals    Frequency    Min 3X/week      PT Plan Current plan remains appropriate    Co-evaluation              AM-PAC PT "6 Clicks" Mobility   Outcome Measure  Help needed turning from your back to your side while in a flat bed without using bedrails?: None Help needed moving from lying on your back to sitting on the side of a flat bed without using bedrails?: None Help needed moving to and from a bed to a chair (including a wheelchair)?: A Little Help needed standing up from a chair using your arms (e.g., wheelchair or bedside chair)?: A Little Help needed to walk in hospital room?: A Little Help needed climbing 3-5 steps with a railing? : A Lot 6 Click Score: 19    End of Session Equipment Utilized During Treatment: Gait belt Activity Tolerance: Patient tolerated treatment well Patient left: in bed;with call bell/phone within reach;with family/visitor present;with nursing/sitter in room Nurse Communication: Mobility status PT Visit Diagnosis: Other abnormalities of gait and mobility (R26.89);Difficulty in walking, not elsewhere classified (R26.2)     Time: 4332-9518 PT Time Calculation (min) (ACUTE ONLY): 23 min  Charges:  $Gait Training: 8-22 mins $Therapeutic Activity: 8-22 mins                     Wyona Almas, PT, DPT Acute Rehabilitation Services Pager [phone] Office [phone]    Deno Etienne 10/11/2020, 4:03 PM

## 2020-10-12 LAB — GLUCOSE, CAPILLARY
Glucose-Capillary: 125 mg/dL — ABNORMAL HIGH (ref 70–99)
Glucose-Capillary: 132 mg/dL — ABNORMAL HIGH (ref 70–99)
Glucose-Capillary: 139 mg/dL — ABNORMAL HIGH (ref 70–99)
Glucose-Capillary: 156 mg/dL — ABNORMAL HIGH (ref 70–99)

## 2020-10-12 NOTE — Plan of Care (Signed)
  Problem: Health Behavior/Discharge Planning: Goal: Ability to manage health-related needs will improve 10/12/2020 0150 by Mollie Germany, RN Outcome: Progressing 10/12/2020 0148 by Mollie Germany, RN Outcome: Progressing   Problem: Clinical Measurements: Goal: Ability to maintain clinical measurements within normal limits will improve 10/12/2020 0150 by Mollie Germany, RN Outcome: Progressing 10/12/2020 0148 by Mollie Germany, RN Outcome: Progressing Goal: Will remain free from infection Outcome: Progressing

## 2020-10-12 NOTE — Plan of Care (Signed)

## 2020-10-12 NOTE — Plan of Care (Signed)
  Problem: Health Behavior/Discharge Planning: Goal: Ability to manage health-related needs will improve Outcome: Progressing   Problem: Clinical Measurements: Goal: Ability to maintain clinical measurements within normal limits will improve Outcome: Progressing Goal: Will remain free from infection Outcome: Progressing   

## 2020-10-12 NOTE — Progress Notes (Signed)
  Speech Language Pathology Treatment: Cognitive-Linquistic  Patient Details Name: Antonio Navarro MRN: [id number] DOB: 07/25/1961 Today's Date: 10/12/2020 Time: 9532-0233 SLP Time Calculation (min) (ACUTE ONLY): 23 min  Assessment / Plan / Recommendation Clinical Impression  Pt seen for cognitve-linguistic tx with functional tasks for toothbrushing and face washing. Pt benefited from [place] multimodal cues for sustained attention and one-step commands. He required more support from SLP for sequencing and problem-solving. Pt often stopped making attempts to problem solve when his way was not working instead of attempting new solutions and also appeared to be unsure of what to do next after he completed each step during these tasks. Pt also verbalized that he wanted his lights off by stating "off" and turned his tv back on with independence, when prompted by SLP. Recommend that continued follow up from SLP for cognitive-linguistic tx.    HPI HPI: Pt is a 61 yo male s/p arrest with downtime 9 minutes. Initial rhythm was V. fib, shocked and received 6 rounds of epi before ROSC achieved.  Total CPR time was 30 minutes. ETT 12/4-12/13. MRI 12/7 suggestive of a widespread anoxic injury. PMH includes kidney stones and HTN.      SLP Plan  Continue with current plan of care       Recommendations                   Oral Care Recommendations: Oral care BID Follow up Recommendations: Skilled Nursing facility SLP Visit Diagnosis: Cognitive communication deficit (I35.686) Plan: Continue with current plan of care       GO                Antonio Navarro., SLP Student 10/12/2020, 10:18 AM

## 2020-10-12 NOTE — Progress Notes (Signed)
Nutrition Follow-up  DOCUMENTATION CODES:   Not applicable  INTERVENTION:   -ContinueMagic cup TID with meals, each supplement provides 290 kcal and 9 grams of protein -Continue feeding assistance with meals -Continuenocturnal feedings:  Jevity 1.5@ 79ml/hr via PEG over 12 hour period  92ml Prosource TF BID   Continue 200 ml free water flush every 2 hours per MD  Tube feeding regimen provides1250kcal (54% of needs),72grams of protein, and 574ml of H2O. Total free water: 2993 ml daily  NUTRITION DIAGNOSIS:   Inadequate oral intake related to inability to eat as evidenced by NPO status.  Ongoing  GOAL:   Patient will meet greater than or equal to 90% of their needs  Progressing   MONITOR:   Diet advancement,Labs,Weight trends,TF tolerance,Skin,I & O's  REASON FOR ASSESSMENT:   Consult,Ventilator Enteral/tube feeding initiation and management  ASSESSMENT:   Patient with PMH significant for HTN and kidney stones. Presents this admission s/p cardiac arrest.  12/13- NGT d/c 12/15- cortrak placed, tip of tube in the stomach 12/17- s/p BSE- recommend continue NPO 12/19- pt pulled cortrak tube, replaced- placement verified by x-ray (stomach) 12/20- s/p BSE- pt refusing PO trials 12/30- cortrak removed, PEG placed 1/5- Advanced to thin liquid diet 1/6- Advanced to full liquid diet 1/11- Advanced to dysphagia 2 diet 1/14- transitioned to nocturnal feedings by MD 1/18- TF d/c by MD 1/24- calorie cont completed- pt consuming about 50% of needs PO; nocturnal feedings resumed  Reviewed I/O's: -180 ml x 24 hours and +17 L since 09/28/20  UOP: 400 ml x 24 hours  Pt unavailable at time of visit.   Pt remains with erratic intake; noted meal completion 0-100%. Nocturnal feedings resumed on 09/11/20; pt tolerating well.   Reviewed wt hx; wt has been stable over the past month.   Labs reviewed: CBGS: 114-156 (inpatient orders for glycemic control are 0-15  units insulin aspart every 6 hours).   Diet Order:   Diet Order            DIET DYS 2 Room service appropriate? No; Fluid consistency: Thin  Diet effective now                 EDUCATION NEEDS:   Not appropriate for education at this time  Skin:  Skin Assessment: Skin Integrity Issues: Skin Integrity Issues:: Other (Comment) Other: MASD to buttocks, skin tear to rt upper back  Last BM:  10/09/20  Height:   Ht Readings from Last 1 Encounters:  09/03/20 6' (1.829 m)    Weight:   Wt Readings from Last 1 Encounters:  10/12/20 86.1 kg    Ideal Body Weight:  80.9 kg  BMI:  Body mass index is 25.74 kg/m.  Estimated Nutritional Needs:   Kcal:  2300-2500 kcal  Protein:  115-130 grams  Fluid:  >/= 2 L/day    Loistine Chance, RD, LDN, [place] Registered Dietitian II Certified Diabetes Care and Education Specialist Please refer to [facility] for RD and/or RD on-call/weekend/after hours pager

## 2020-10-12 NOTE — Progress Notes (Signed)
[facility] PROGRESS NOTE  Antonio Navarro GYF:[id number] DOB: 11-30-1960 DOA: 07/22/2020 PCP: Patient, No Pcp Per       Status: Remains inpatient appropriate because:Altered mental status and Unsafe d/c plan   Dispo: The patient is from: Home              Anticipated d/c is to: SNF              Anticipated d/c date is: > 3 days              Patient currently is medically stable to d/c.  Barriers to discharge: Medicaid and disability pending. No SNF bed offers.  No further nocturnal agitation or wandering behavior so hopeful will be eligible for SNF bed offer.  Patient able to ambulate 300 feet so hopefully he can eventually discharge home with family as long as they can provide appropriate 24/7 monitoring.   Code Status: Full Family Communication: VM left for sister on 2/23 explaining plans to decrease p.m. Seroquel dose due to her concerns over excessive a.m. sleepiness. DVT prophylaxis: sq heparin Vaccination status: Completed both doses of Pfizer Covid vaccination on 12/14/2019; no option for [employer] booster at this facility therefore [facility] to ordered on 09/21/2020 but was not given  Foley catheter: No  HPI: 60 year old male history of hypertension prescribed Norvasc prior to admission, chronic back pain, prior renal calculi who experienced an out of hospital cardiac arrest with downtime of 9 minutes.  Initial rhythm was ventricular fibrillation.  He was defibrillated deceived 6 doses of epi before ROSC.  Subsequent work-up revealed STEMI.  He underwent cardiac catheterization with a DES placed to the circumflex.  Cardiology following and has placed patient on Brilinta and aspirin and is also being treated with beta-blocker and statins.  From a neurological standpoint he clinically has anoxic brain injury.  During this hospitalization he experienced AKI and was treated with fluids and correction of hypoperfusion from cardiac arrest.  Echo performed this admission revealed  ischemic cardiomyopathy with an EF of 40%  In addition to above patient has been unable to pass swallowing evaluations and subsequently has undergone percutaneous PEG tube placement on 12/30.  Patient is awake and moving all extremities spontaneously but does not follow commands and does not verbalize palliative care has met with patient and family and has emphasized poor prognosis and will continue to meet with the family during the hospitalization.  Current plan is to transition to SNF bed once available.  Expect need for lifelong 24/7 care for all ADLs.  Subjective: Patient much more alert today.  When performing orientation assessment I asked patient where he was at and if he knew where he was at and he nodded yes but then was unable to tell me.  When I asked him if he was at home he stated no but was unable to tell me where he was at.  I informed him he was at the hospital.  He could not remember what had occurred to bring him to the hospital so once again I informed him that he had had a heart attack resulting in lack of oxygen to the brain and causing a brain injury.  Because of this he requires 24-hour supervision and we are trying to find a safe place to discharge.  He nodded in agreement.  Objective: Vitals:   10/11/20 2034 10/12/20 0526  BP: (!) 143/91 (!) 148/81  Pulse: 83 79  Resp: 18 18  Temp: 98 F (36.7 C) 97.8  F (36.6 C)  SpO2: 100% 98%    Intake/Output Summary (Last 24 hours) at 10/12/2020 0741 Last data filed at 10/11/2020 1820 Gross per 24 hour  Intake 220 ml  Output 400 ml  Net -180 ml   Filed Weights   10/10/20 0314 10/11/20 0208 10/12/20 0057  Weight: 86.5 kg 85.5 kg 86.1 kg    Exam: Constitutional: Alert, no distress Heart: Normal heart sounds, skin warm and dry.  Pulse is regular.  No peripheral edema Respiratory: Lungs are clear and he is stable on room air Abdomen:  D2 diet with marginal intake-PEG tube for nocturnal feedings.  LBM 2/21 Neurologic: CN  2-12 roughly without any appreciable focal neurological deficits, MOE x4, strength is 5/5.   Psychiatric:  Skin: Multiple flat white patches on left side of back extending down to buttocks up to the mid back.  No areas of papular lesions near these white lesions on the buttocks.   Assessment/Plan: Acute problems: Out of hospital cardiac arrest secondary to STEMI -ROSC and subsequent catheterization revealed disease in circumflex requiring DES -Continue beta-blocker, statin, Plavix, isosorbide and hydralazine  Significant anoxic brain injury secondary to cardiac arrest/deconditioning/gait instability -Consistently participating with PT and OT.  SLP has cleared for dysphagia 2 with thin liquids -Declined by CIR since family unable to provide 24/7 care.  Plan is for SNF -Continue Seroquel 100 mg at at bedtime -2/18 add Seroquel 25 mg every 12 hours as needed for agitation and wandering -Continue daytime Symmetrel  **PT NOTES FROM 2/21**  Pt progressing steadily towards his physical therapy goals. Pt with increased verbalizations; requiring repetition at times to follow and maintain attention or following commands during tasks. Pt requiring min assist for functional mobility. Ambulating x 300 feet with a walker. Pt continues with weakness, poor standing balance, cognitive deficits. Continue to recommend SNF for ongoing Physical Therapy.     White lesions/rash on back and buttocks -Appeared after patient had laying in tube feeding    10/10/20                           10/10/20 -We will continue to monitor any evolution of these lesions or changes document accordingly  Situational depression -Started Celexa 2/16-increased to 20 mg 2/18  Ischemic cardiomyopathy -Echo this admission EF 40% -Continue hydralazine and Isordil -No ACE inhibitor secondary to underlying kidney disease -Will need follow-up echo 3 months post initial echo which was completed on 07/23/2020  Hypertension -Was on  Norvasc prior to admission -Continue cardiovascular meds as listed above  Acute on chronic kidney disease stage IIIb -Suspect prerenal etiology in the setting of hypoperfusion from ventricular fibrillation arrest -Baseline creatinine between 1.6 and 2; creatinine on 12/19 was 1.67-will repeat in a.m. on 08/19/2020 -Continue free water per tube and intermittently follow labs  Dysphagia, moderate protein calorie malnutrition Nutrition Problem: Inadequate oral intake Etiology: inability to eat Signs/Symptoms: NPO status  -Currently on nocturnal tube feedings.  Since 2/4 patient has eaten between 60 and 95% of meals.  If this trend continues likely can transition to oral diet only. -1/24 Resumed nocturnal TFs been ongoing poor oral intake during the day -Currently on D2 diet with thin liquids -Repeat SLP eval on 2/8 continues to recommend D2 diet with thin liquids. Interventions: Tube feeding,Prostat Estimated body mass index is 25.74 kg/m as calculated from the following:   Height as of this encounter: 6' (1.829 m).   Weight as of this encounter: 86.1 kg.  Other problems: Acute hypernatremia -Continue free water per tube  E. coli UTI -Has completed 5 days of Bactrim  Hyperglycemia without an underlying diagnosis of diabetes mellitus Resolved -Likely related to acute stress -Recent issues with hypoglycemia so sliding scale insulin decreased to very sensitive and Levemir decreased from 30 twice daily to 15 twice daily as of 1/14 -Hemoglobin A1c 4.7   Acute hypoxemic respiratory failure -Resolved -Stable on room air with O2 sats between 97 and 99%  Nonsustained VT -Continue amiodarone and beta-blocker -1/31 LFTs normal  Skin tear right lateral back Wound / Incision (Open or Dehisced) 07/28/20 Skin tear Back Lateral;Right;Upper (Active)  Date First Assessed/Time First Assessed: 07/28/20 0300   Wound Type: Skin tear  Location: Back  Location Orientation: Lateral;Right;Upper   Present on Admission: No    Assessments 07/28/2020  7:49 PM 10/11/2020 10:00 AM  Dressing Type None --  Wound Length (cm) -- 0 cm  Wound Width (cm) -- 0 cm  Wound Depth (cm) -- 0 cm  Wound Volume (cm^3) -- 0 cm^3  Wound Surface Area (cm^2) -- 0 cm^2  Drainage Amount None --  Treatment Cleansed --     No Linked orders to display    Data Reviewed: Basic Metabolic Panel: Recent Labs  Lab 10/05/20 0959  NA 138  K 4.0  CL 104  CO2 25  GLUCOSE 133*  BUN 14  CREATININE 1.25*  CALCIUM 9.1   Liver Function Tests: Recent Labs  Lab 10/05/20 0959  AST 21  ALT 33  ALKPHOS 71  BILITOT 0.7  PROT 6.7  ALBUMIN 3.1*   No results for input(s): LIPASE, AMYLASE in the last 168 hours. No results for input(s): AMMONIA in the last 168 hours. CBC: Recent Labs  Lab 10/05/20 0959  WBC 6.1  NEUTROABS 3.1  HGB 12.0*  HCT 35.8*  MCV 96.0  PLT 191   Cardiac Enzymes: No results for input(s): CKTOTAL, CKMB, CKMBINDEX, TROPONINI in the last 168 hours. BNP (last 3 results) No results for input(s): BNP in the last 8760 hours.  ProBNP (last 3 results) No results for input(s): PROBNP in the last 8760 hours.  CBG: Recent Labs  Lab 10/11/20 0934 10/11/20 1253 10/11/20 1621 10/12/20 0025 10/12/20 0628  GLUCAP 154* 126* 114* 156* 125*    Recent Results (from the past 240 hour(s))  Culture, Urine     Status: None   Collection Time: 10/05/20 10:40 AM   Specimen: Urine, Catheterized  Result Value Ref Range Status   Specimen Description URINE, CATHETERIZED  Final   Special Requests NONE  Final   Culture   Final    NO GROWTH Performed at [facility], [place] [address], [place], [place] [address]    Report Status 10/06/2020 FINAL  Final     Studies: No results found.  Scheduled Meds: . amantadine  100 mg Per Tube Daily  . aspirin  81 mg Per Tube Daily  . atorvastatin  80 mg Per Tube Daily  . carvedilol  25 mg Per Tube BID WC  . chlorhexidine gluconate (MEDLINE KIT)   15 mL Mouth Rinse BID  . citalopram  20 mg Per Tube Daily  . clopidogrel  75 mg Per Tube Daily  . COVID-19 mRNA vaccine (Moderna)  0.25 mL Intramuscular Once  . enoxaparin (LOVENOX) injection  40 mg Subcutaneous Q24H  . feeding supplement (PROSource TF)  45 mL Per Tube BID  . free water  200 mL Per Tube Q2H  . hydrALAZINE  25 mg Per Tube Q8H  . insulin aspart  0-15 Units Subcutaneous Q6H  . isosorbide dinitrate  10 mg Per Tube TID  . mouth rinse  15 mL Mouth Rinse BID  . polyvinyl alcohol  1 drop Both Eyes QID  . QUEtiapine  25 mg Per Tube Daily  . sodium chloride flush  3 mL Intravenous Q12H   Continuous Infusions: . feeding supplement (JEVITY 1.5 CAL/FIBER) 780 mL (10/11/20 2212)    Active Problems:   Acute ST elevation myocardial infarction (STEMI) [facility])   Cardiac arrest (HCC)   Elevated blood pressure reading with diagnosis of hypertension   Shock circulatory (HCC)   Encephalopathy   History of ETT   Cardiomyopathy, ischemic   Anoxic brain damage ([place])   Dysphagia   DNR (do not resuscitate) discussion   Gait instability   E. coli UTI   Consultants:  Cardiology  Interventional radiology  PCCM  Neurology  Procedures:  Cardiac catheterization 12/4  Continuous EEG 12/4 and overnight EEG with video 12/5  Echocardiogram 12/5  EEG 12/9  Percutaneous PEG tube placement 12/30  Antibiotics: Anti-infectives (From admission, onward)   Start     Dose/Rate Route Frequency Ordered Stop   09/13/20 1000  sulfamethoxazole-trimethoprim (BACTRIM) 200-40 MG/5ML suspension 20 mL        20 mL Per Tube Every 12 hours 09/13/20 0734 09/19/20 0926   09/13/20 0745  cefTRIAXone (ROCEPHIN) 1 g in sodium chloride 0.9 % 100 mL IVPB  Status:  Discontinued        1 g 200 mL/hr over 30 Minutes Intravenous Every 24 hours 09/13/20 0658 09/13/20 0734   08/17/20 0000  ceFAZolin (ANCEF) IVPB 2g/100 mL premix        2 g 200 mL/hr over 30 Minutes Intravenous To Radiology 08/15/20 1346  08/17/20 1111   07/22/20 0830  cefTRIAXone (ROCEPHIN) 2 g in sodium chloride 0.9 % 100 mL IVPB        2 g 200 mL/hr over 30 Minutes Intravenous Every 24 hours 07/22/20 0720 07/26/20 0858       Time spent: 20 minutes    Erin Hearing ANP  [facility] 7 am - 330 pm/M-F for direct patient care and secure chat Please refer to Amion for contact information 82 days

## 2020-10-13 LAB — GLUCOSE, CAPILLARY
Glucose-Capillary: 111 mg/dL — ABNORMAL HIGH (ref 70–99)
Glucose-Capillary: 134 mg/dL — ABNORMAL HIGH (ref 70–99)
Glucose-Capillary: 135 mg/dL — ABNORMAL HIGH (ref 70–99)
Glucose-Capillary: 147 mg/dL — ABNORMAL HIGH (ref 70–99)
Glucose-Capillary: 154 mg/dL — ABNORMAL HIGH (ref 70–99)

## 2020-10-13 NOTE — Plan of Care (Signed)
  Problem: Activity: Goal: Risk for activity intolerance will decrease Outcome: Progressing   

## 2020-10-13 NOTE — Progress Notes (Signed)
PT Cancellation Note  Patient Details Name: MEHKAI GALLO MRN: [id number] DOB: [birth year]   Cancelled Treatment:    Reason Eval/Treat Not Completed: Fatigue/lethargy limiting ability to participate;Other (comment)   Attempted x 3 1. With RN and NT cleaning up 2. Asleep 3. Eating and too lethargic to participate in session  Lyanne Co, DPT Acute Rehabilitation Services [phone]   [place] 10/13/2020, 12:32 PM

## 2020-10-13 NOTE — Progress Notes (Signed)
Occupational Therapy Treatment Patient Details Name: Antonio Navarro MRN: [id number] DOB: 04/20/1961 Today's Date: 10/13/2020    History of present illness [name] with history of tobacco abuse, obesity, HTN who was admitted 07/22/20 post cardiac arrest with inferolateral STEMI. He was resuscitated in the field by EMS after being found to be in V fib. Approximately 30 minutes of CPR. He was intubated on arrival.  Pt with anoxic brain injury. PEG placed on 08/17/20.   OT comments  Patient seen this date during lunch with SO at the bedside.  Patient assisted to recliner for lunch with Min A, tactile and verbal cues.  SO attempting to feed the patient with HOB below 30 degrees.  OT encouraged family to allow patient to complete as much of the task with appropriate verbal and tactile cues.  Also, discussed assisting when needed given increased time frame to eat and perform tasks.  OT reviewed goals with family, and discussed decreasing distractions, and trying to reduce level of cues for sequencing.  Goals adjusted as needed with decreasing cues added.  SNF is recommended for continued post acute rehab and cognitive retraining.  OT will continue to see him in the acute setting to maximize functional status for quality of life.    Follow Up Recommendations  SNF;Supervision/Assistance - 24 hour    Equipment Recommendations  Wheelchair (measurements OT);Wheelchair cushion (measurements OT);Hospital bed;3 in 1 bedside commode    Recommendations for Other Services      Precautions / Restrictions Precautions Precautions: Fall Restrictions Weight Bearing Restrictions: No       Mobility Bed Mobility Overal bed mobility: Needs Assistance Bed Mobility: Sit to Supine     Supine to sit: Min assist          Transfers Overall transfer level: Needs assistance   Transfers: Sit to/from Stand;Stand Pivot Transfers Sit to Stand: Min assist Stand pivot transfers: Min assist       General  transfer comment: HHA    Balance           Standing balance support: Single extremity supported Standing balance-Leahy Scale: Poor Standing balance comment: at least one UE supported during ADLs and functional mobility                           ADL either performed or assessed with clinical judgement   ADL   Eating/Feeding: Moderate assistance Eating/Feeding Details (indicate cue type and reason): patient found being fed this date by SO                                                           Cognition  No changes noted from prior session                                                                Pertinent Vitals/ Pain       Pain Assessment: Faces Faces Pain Scale: No hurt Pain Intervention(s): Monitored during session  Frequency  Min 2X/week        Progress Toward Goals  OT Goals(current goals can now be found in the care plan section)  Progress towards OT goals: Progressing toward goals  Acute Rehab OT Goals Patient Stated Goal: SO is hoping he can get stronger and do more for himself OT Goal Formulation: With patient/family Time For Goal Achievement: 10/26/20 Potential to Achieve Goals: Fair ADL Goals Pt Will Perform Eating: with min assist;with adaptive utensils;sitting Pt Will Perform Grooming: with set-up;sitting;standing Pt Will Perform Upper Body Bathing: with supervision;sitting;standing Pt Will Perform Upper Body Dressing: with supervision;sitting Pt Will Transfer to Toilet: with supervision;ambulating;regular height toilet Additional ADL Goal #2: Patient will use ADL items handed to him with 75% accuracy and moderate verbal cues  Plan Discharge plan remains appropriate;Frequency remains appropriate    Co-evaluation                 AM-PAC OT "6 Clicks" Daily Activity     Outcome Measure    Help from another person eating meals?: A Little Help from another person taking care of personal grooming?: A Little Help from another person toileting, which includes using toliet, bedpan, or urinal?: A Lot Help from another person bathing (including washing, rinsing, drying)?: A Lot Help from another person to put on and taking off regular upper body clothing?: A Little Help from another person to put on and taking off regular lower body clothing?: A Lot 6 Click Score: 15    End of Session    OT Visit Diagnosis: Muscle weakness (generalized) (M62.81);Pain;Other abnormalities of gait and mobility (R26.89);Cognitive communication deficit (R41.841);Low vision, both eyes (H54.2)   Activity Tolerance Patient tolerated treatment well   Patient Left in chair;with call bell/phone within reach;with family/visitor present   Nurse Communication          Time: 8502-7741 OT Time Calculation (min): 13 min  Charges: OT General Charges $OT Visit: 1 Visit OT Treatments $Self Care/Home Management : 8-22 mins  10/13/2020  Rich, OTR/L  Acute Rehabilitation Services  Office:  [phone]    Metta Clines 10/13/2020, 1:45 PM

## 2020-10-13 NOTE — Progress Notes (Signed)
[facility] PROGRESS NOTE  Antonio Navarro GLO:[id number] DOB: 09/26/1960 DOA: 07/22/2020 PCP: Patient, No Pcp Per       Status: Remains inpatient appropriate because:Altered mental status and Unsafe d/c plan   Dispo: The patient is from: Home              Anticipated d/c is to: SNF              Anticipated d/c date is: > 3 days              Patient currently is medically stable to d/c.  Barriers to discharge: Medicaid and disability pending. No SNF bed offers.  No further nocturnal agitation or wandering behavior so hopeful will be eligible for SNF bed offer.  Patient able to ambulate 300 feet so hopefully he can eventually discharge home with family as long as they can provide appropriate 24/7 monitoring.   Code Status: Full Family Communication: VM left for sister on 2/23 explaining plans to decrease p.m. Seroquel dose due to her concerns over excessive a.m. sleepiness. DVT prophylaxis: sq heparin Vaccination status: Completed both doses of Pfizer Covid vaccination on 12/14/2019; no option for [employer] booster at this facility therefore [facility] to ordered on 09/21/2020 but was not given  Foley catheter: No  HPI: 60 year old male history of hypertension prescribed Norvasc prior to admission, chronic back pain, prior renal calculi who experienced an out of hospital cardiac arrest with downtime of 9 minutes.  Initial rhythm was ventricular fibrillation.  He was defibrillated deceived 6 doses of epi before ROSC.  Subsequent work-up revealed STEMI.  He underwent cardiac catheterization with a DES placed to the circumflex.  Cardiology following and has placed patient on Brilinta and aspirin and is also being treated with beta-blocker and statins.  From a neurological standpoint he clinically has anoxic brain injury.  During this hospitalization he experienced AKI and was treated with fluids and correction of hypoperfusion from cardiac arrest.  Echo performed this admission revealed  ischemic cardiomyopathy with an EF of 40%  In addition to above patient has been unable to pass swallowing evaluations and subsequently has undergone percutaneous PEG tube placement on 12/30.  Patient is awake and moving all extremities spontaneously but does not follow commands and does not verbalize palliative care has met with patient and family and has emphasized poor prognosis and will continue to meet with the family during the hospitalization.  Current plan is to transition to SNF bed once available.  Expect need for lifelong 24/7 care for all ADLs.  Subjective: Awake and alert. Staff performing pericare and changing patient's bed.  Objective: Vitals:   10/13/20 0622 10/13/20 0723  BP: (!) 147/78 (!) 157/90  Pulse:  76  Resp:  16  Temp:  98.6 F (37 C)  SpO2:  95%    Intake/Output Summary (Last 24 hours) at 10/13/2020 0800 Last data filed at 10/13/2020 0300 Gross per 24 hour  Intake 100 ml  Output 1100 ml  Net -1000 ml   Filed Weights   10/11/20 0208 10/12/20 0057 10/13/20 0353  Weight: 85.5 kg 86.1 kg 85.8 kg    Exam: Constitutional: Alert, no acute distress Heart: Heart sounds, pulses regular, extremities warm to touch Respiratory: Lungs are clear posteriorly, stable on room air Abdomen:  D2 diet with marginal intake-PEG tube for nocturnal tube feedings. LBM 2/25 Neurologic: CN 2-12 roughly without any appreciable focal neurological deficits, MOE x4, strength is 5/5.   Psychiatric: Awake, oriented times name. Aware he  is not at home. Skin: Multiple flat white patches on left side of back extending down to buttocks up to the mid back. The areas have subsequently developed some mild pink/periwound redness without erythema, drainage or tenderness. Previous papular lesions near these white lesions on the buttocks decreased in number and size. Also has similar lesions but not as defined on posterior upper left back just below the scapula   Assessment/Plan: Acute  problems: Out of hospital cardiac arrest secondary to STEMI -ROSC and subsequent catheterization revealed disease in circumflex requiring DES -Continue beta-blocker, statin, Plavix, isosorbide and hydralazine  Significant anoxic brain injury secondary to cardiac arrest/deconditioning/gait instability -Consistently participating with PT and OT.  SLP has cleared for dysphagia 2 with thin liquids -Declined by CIR since family unable to provide 24/7 care.  Plan is for SNF -Continue Seroquel 100 mg at at bedtime -2/18 add Seroquel 25 mg every 12 hours as needed for agitation and wandering -Continue daytime Symmetrel  **PT NOTES FROM 2/21**  Pt progressing steadily towards his physical therapy goals. Pt with increased verbalizations; requiring repetition at times to follow and maintain attention or following commands during tasks. Pt requiring min assist for functional mobility. Ambulating x 300 feet with a walker. Pt continues with weakness, poor standing balance, cognitive deficits. Continue to recommend SNF for ongoing Physical Therapy.     White lesions/rash on back and buttocks -Appeared after patient had laying in tube feeding    10/10/20                           10/10/20 -We will continue to monitor any evolution of these lesions or changes document accordingly  Situational depression -Started Celexa 2/16-increased to 20 mg 2/18  Ischemic cardiomyopathy -Echo this admission EF 40% -Continue hydralazine and Isordil -No ACE inhibitor secondary to underlying kidney disease -Will need follow-up echo 3 months post initial echo which was completed on 07/23/2020  Hypertension -Was on Norvasc prior to admission -Continue cardiovascular meds as listed above  Acute on chronic kidney disease stage IIIb -Suspect prerenal etiology in the setting of hypoperfusion from ventricular fibrillation arrest -Baseline creatinine between 1.6 and 2; creatinine on 12/19 was 1.67-will repeat in a.m. on  08/19/2020 -Continue free water per tube and intermittently follow labs  Dysphagia, moderate protein calorie malnutrition Nutrition Problem: Inadequate oral intake Etiology: inability to eat Signs/Symptoms: NPO status  -Currently on nocturnal tube feedings.  Since 2/4 patient has eaten between 60 and 95% of meals.  If this trend continues likely can transition to oral diet only. -1/24 Resumed nocturnal TFs been ongoing poor oral intake during the day -Currently on D2 diet with thin liquids -Repeat SLP eval on 2/8 continues to recommend D2 diet with thin liquids. Interventions: Tube feeding,Prostat Estimated body mass index is 25.65 kg/m as calculated from the following:   Height as of this encounter: 6' (1.829 m).   Weight as of this encounter: 85.8 kg.     Other problems: Acute hypernatremia -Continue free water per tube  E. coli UTI -Has completed 5 days of Bactrim  Hyperglycemia without an underlying diagnosis of diabetes mellitus Resolved -Likely related to acute stress -Recent issues with hypoglycemia so sliding scale insulin decreased to very sensitive and Levemir decreased from 30 twice daily to 15 twice daily as of 1/14 -Hemoglobin A1c 4.7   Acute hypoxemic respiratory failure -Resolved -Stable on room air with O2 sats between 97 and 99%  Nonsustained VT -Continue amiodarone and  beta-blocker -1/31 LFTs normal  Skin tear right lateral back Wound / Incision (Open or Dehisced) 07/28/20 Skin tear Back Lateral;Right;Upper (Active)  Date First Assessed/Time First Assessed: 07/28/20 0300   Wound Type: Skin tear  Location: Back  Location Orientation: Lateral;Right;Upper  Present on Admission: No    Assessments 07/28/2020  7:49 PM 10/11/2020 10:00 AM  Dressing Type None --  Wound Length (cm) -- 0 cm  Wound Width (cm) -- 0 cm  Wound Depth (cm) -- 0 cm  Wound Volume (cm^3) -- 0 cm^3  Wound Surface Area (cm^2) -- 0 cm^2  Drainage Amount None --  Treatment Cleansed --      No Linked orders to display    Data Reviewed: Basic Metabolic Panel: No results for input(s): NA, K, CL, CO2, GLUCOSE, BUN, CREATININE, CALCIUM, MG, PHOS in the last 168 hours. Liver Function Tests: No results for input(s): AST, ALT, ALKPHOS, BILITOT, PROT, ALBUMIN in the last 168 hours. No results for input(s): LIPASE, AMYLASE in the last 168 hours. No results for input(s): AMMONIA in the last 168 hours. CBC: No results for input(s): WBC, NEUTROABS, HGB, HCT, MCV, PLT in the last 168 hours. Cardiac Enzymes: No results for input(s): CKTOTAL, CKMB, CKMBINDEX, TROPONINI in the last 168 hours. BNP (last 3 results) No results for input(s): BNP in the last 8760 hours.  ProBNP (last 3 results) No results for input(s): PROBNP in the last 8760 hours.  CBG: Recent Labs  Lab 10/12/20 0628 10/12/20 1153 10/12/20 1742 10/13/20 0014 10/13/20 0614  GLUCAP 125* 139* 132* 135* 134*    Recent Results (from the past 240 hour(s))  Culture, Urine     Status: None   Collection Time: 10/05/20 10:40 AM   Specimen: Urine, Catheterized  Result Value Ref Range Status   Specimen Description URINE, CATHETERIZED  Final   Special Requests NONE  Final   Culture   Final    NO GROWTH Performed at [facility], [place] [address], [place], [place] [address]    Report Status 10/06/2020 FINAL  Final     Studies: No results found.  Scheduled Meds: . amantadine  100 mg Per Tube Daily  . aspirin  81 mg Per Tube Daily  . atorvastatin  80 mg Per Tube Daily  . carvedilol  25 mg Per Tube BID WC  . chlorhexidine gluconate (MEDLINE KIT)  15 mL Mouth Rinse BID  . citalopram  20 mg Per Tube Daily  . clopidogrel  75 mg Per Tube Daily  . COVID-19 mRNA vaccine (Moderna)  0.25 mL Intramuscular Once  . enoxaparin (LOVENOX) injection  40 mg Subcutaneous Q24H  . feeding supplement (PROSource TF)  45 mL Per Tube BID  . free water  200 mL Per Tube Q2H  . hydrALAZINE  25 mg Per Tube Q8H  . insulin aspart   0-15 Units Subcutaneous Q6H  . isosorbide dinitrate  10 mg Per Tube TID  . mouth rinse  15 mL Mouth Rinse BID  . polyvinyl alcohol  1 drop Both Eyes QID  . QUEtiapine  25 mg Per Tube Daily  . sodium chloride flush  3 mL Intravenous Q12H   Continuous Infusions: . feeding supplement (JEVITY 1.5 CAL/FIBER) 780 mL (10/12/20 2215)    Active Problems:   Acute ST elevation myocardial infarction (STEMI) (HCC)   Cardiac arrest (HCC)   Elevated blood pressure reading with diagnosis of hypertension   Shock circulatory (HCC)   Encephalopathy   History of ETT   Cardiomyopathy,  ischemic   Anoxic brain damage ([place])   Dysphagia   DNR (do not resuscitate) discussion   Gait instability   E. coli UTI   Consultants:  Cardiology  Interventional radiology  PCCM  Neurology  Procedures:  Cardiac catheterization 12/4  Continuous EEG 12/4 and overnight EEG with video 12/5  Echocardiogram 12/5  EEG 12/9  Percutaneous PEG tube placement 12/30  Antibiotics: Anti-infectives (From admission, onward)   Start     Dose/Rate Route Frequency Ordered Stop   09/13/20 1000  sulfamethoxazole-trimethoprim (BACTRIM) 200-40 MG/5ML suspension 20 mL        20 mL Per Tube Every 12 hours 09/13/20 0734 09/19/20 0926   09/13/20 0745  cefTRIAXone (ROCEPHIN) 1 g in sodium chloride 0.9 % 100 mL IVPB  Status:  Discontinued        1 g 200 mL/hr over 30 Minutes Intravenous Every 24 hours 09/13/20 0658 09/13/20 0734   08/17/20 0000  ceFAZolin (ANCEF) IVPB 2g/100 mL premix        2 g 200 mL/hr over 30 Minutes Intravenous To Radiology 08/15/20 1346 08/17/20 1111   07/22/20 0830  cefTRIAXone (ROCEPHIN) 2 g in sodium chloride 0.9 % 100 mL IVPB        2 g 200 mL/hr over 30 Minutes Intravenous Every 24 hours 07/22/20 0720 07/26/20 0858       Time spent: 20 minutes    Erin Hearing ANP  [facility] 7 am - 330 pm/M-F for direct patient care and secure chat Please refer to Amion for contact  information 83 days

## 2020-10-13 NOTE — Progress Notes (Signed)
CSW spoke with Colletta Maryland at [facility] in [place] who is agreeable to review the clinicals for this patient.  CSW sent clinical information over for review.  [name], MSW, LCSW Transitions of Care  Clinical Social Worker II [phone]

## 2020-10-14 LAB — CBC
HCT: 38.4 % — ABNORMAL LOW (ref 39.0–52.0)
Hemoglobin: 12.4 g/dL — ABNORMAL LOW (ref 13.0–17.0)
MCH: 30.8 pg (ref 26.0–34.0)
MCHC: 32.3 g/dL (ref 30.0–36.0)
MCV: 95.5 fL (ref 80.0–100.0)
Platelets: 200 10*3/uL (ref 150–400)
RBC: 4.02 MIL/uL — ABNORMAL LOW (ref 4.22–5.81)
RDW: 13.2 % (ref 11.5–15.5)
WBC: 9.9 10*3/uL (ref 4.0–10.5)
nRBC: 0 % (ref 0.0–0.2)

## 2020-10-14 LAB — BASIC METABOLIC PANEL
Anion gap: 9 (ref 5–15)
BUN: 16 mg/dL (ref 6–20)
CO2: 28 mmol/L (ref 22–32)
Calcium: 9.3 mg/dL (ref 8.9–10.3)
Chloride: 100 mmol/L (ref 98–111)
Creatinine, Ser: 1.15 mg/dL (ref 0.61–1.24)
GFR, Estimated: >60 mL/min (ref >60)
Glucose, Bld: 161 mg/dL — ABNORMAL HIGH (ref 70–99)
Potassium: 4.3 mmol/L (ref 3.5–5.1)
Sodium: 137 mmol/L (ref 135–145)

## 2020-10-14 LAB — GLUCOSE, CAPILLARY
Glucose-Capillary: 142 mg/dL — ABNORMAL HIGH (ref 70–99)
Glucose-Capillary: 179 mg/dL — ABNORMAL HIGH (ref 70–99)
Glucose-Capillary: 118 mg/dL — ABNORMAL HIGH (ref 70–99)

## 2020-10-14 MED ORDER — FLUCONAZOLE 100 MG PO TABS
100.0000 mg | ORAL_TABLET | Freq: Every day | ORAL | Status: AC
  Administered 2020-10-14 – 2020-10-18 (×5): 100 mg via ORAL
  Filled 2020-10-14 (×5): qty 1

## 2020-10-14 NOTE — Plan of Care (Signed)

## 2020-10-14 NOTE — Plan of Care (Signed)
  Problem: Clinical Measurements: Goal: Will remain free from infection Outcome: Progressing Goal: Diagnostic test results will improve Outcome: Progressing   Problem: Nutrition: Goal: Adequate nutrition will be maintained Outcome: Progressing   Problem: Safety: Goal: Ability to remain free from injury will improve Outcome: Progressing   Problem: Skin Integrity: Goal: Risk for impaired skin integrity will decrease Outcome: Progressing   

## 2020-10-14 NOTE — Progress Notes (Signed)
Patient seen and examined.  He was sleeping, wake up to voice.  He denies pain. Nursing staff patient has been calm and less sleepy  General: Alert and calm Cardiovascular: S1-S2 regular rhythm or rate Lungs: Clear to auscultation. Skin: Rash with white small spot  fragile skin with a small bleeding.  60 year old with past medical history significant for hypertension, who experienced an out of hospital cardiac arrest with downtime of 9 minutes.  Subsequently was diagnosed with a STEMI.  Underwent cardiac catheterization with DES placed to the circumflex.  Patient was not able to pass a swallow evaluation and underwent PEG tube placement.  Was a started on dysphagia diet.  -Rash: We will start fluconazole for 5 days.  I have placed consult for wound care nurse

## 2020-10-15 LAB — GLUCOSE, CAPILLARY
Glucose-Capillary: 146 mg/dL — ABNORMAL HIGH (ref 70–99)
Glucose-Capillary: 149 mg/dL — ABNORMAL HIGH (ref 70–99)
Glucose-Capillary: 151 mg/dL — ABNORMAL HIGH (ref 70–99)
Glucose-Capillary: 175 mg/dL — ABNORMAL HIGH (ref 70–99)

## 2020-10-15 MED ORDER — GERHARDT'S BUTT CREAM
TOPICAL_CREAM | Freq: Three times a day (TID) | CUTANEOUS | Status: AC
  Administered 2020-10-15 – 2020-10-27 (×5): 1 via TOPICAL
  Filled 2020-10-15 (×2): qty 1

## 2020-10-15 MED ORDER — CHLORHEXIDINE GLUCONATE 0.12 % MT SOLN
OROMUCOSAL | Status: AC
  Administered 2020-10-15: 15 mL via OROMUCOSAL
  Filled 2020-10-15: qty 15

## 2020-10-15 NOTE — Progress Notes (Signed)
60 year old with past medical history significant for hypertension, who experienced an out of hospital cardiac arrest with downtime of 9 minutes.  Subsequently was diagnosed with a STEMI.  Underwent cardiac catheterization with DES placed to the circumflex.  Patient was not able to pass a swallow evaluation and underwent PEG tube placement.  Was a started on dysphagia diet.  Subjective; alert, denies  Pain he is calm.   General: Alert, in no distress Cardiovascular: S 1, S 2 RRR Lungs: CTA Skin: Rash with white small spot  fragile skin with small area of  bleeding.    -Rash: Continue with   fluconazole for 5 days.               Wound care Nurse consulted.

## 2020-10-15 NOTE — Progress Notes (Signed)
Patient feeding is ordered running from 10pm-10am  has tolerated sitting up and eating 75% lunch and supper.

## 2020-10-15 NOTE — Consult Note (Signed)
[facility] Nurse Consult Note: Reason for Consult: full and partial thickeness open areas at sacrum and left flank.  Sacral presentation is drying and left flank is clearing. Photos in EMR. Patient's daughter is present for my assessment and her questions are answered to her express satisfaction. Wound type: full and partial thickness (etiology not known) Pressure Injury POA: NA Measurement: Affected area on left hip measures 10cm x 8cm with scattered open areas within. Serous exudate Wound ZTA:EWYB, moist Drainage (amount, consistency, odor) serous, scant Periwound: intact, dry Dressing procedure/placement/frequency: I will provide guidance for the care of the left flank and sacrum areas for nursing: cleanse area with NS, pat dry. Apply a thin layer of Gerhart's Butt cream (hydrocortison:zinc oxide:lotrimin cream) three times daily.  Geomat provided for pressure redistribution.  [facility] nursing team will not follow, but will remain available to this patient, the nursing and medical teams.  Please re-consult if needed. Thanks, Maudie Flakes, MSN, RN, [place], Arther Abbott  Pager# [phone]

## 2020-10-16 LAB — GLUCOSE, CAPILLARY
Glucose-Capillary: 101 mg/dL — ABNORMAL HIGH (ref 70–99)
Glucose-Capillary: 111 mg/dL — ABNORMAL HIGH (ref 70–99)
Glucose-Capillary: 165 mg/dL — ABNORMAL HIGH (ref 70–99)
Glucose-Capillary: 168 mg/dL — ABNORMAL HIGH (ref 70–99)
Glucose-Capillary: 99 mg/dL (ref 70–99)

## 2020-10-16 MED ORDER — ENSURE ENLIVE PO LIQD
237.0000 mL | Freq: Two times a day (BID) | ORAL | Status: DC
  Administered 2020-10-16 – 2020-11-21 (×47): 237 mL via ORAL

## 2020-10-16 NOTE — Progress Notes (Signed)
Physical Therapy Treatment Patient Details Name: Antonio Navarro MRN: [id number] DOB: Dec 29, 1960 Today's Date: 10/16/2020    History of Present Illness [name] with history of tobacco abuse, obesity, HTN who was admitted 07/22/20 post cardiac arrest with inferolateral STEMI. He was resuscitated in the field by EMS after being found to be in V fib. Approximately 30 minutes of CPR. He was intubated on arrival.  Pt with anoxic brain injury. PEG placed on 08/17/20.    PT Comments    Pt aroused from sleep. Able to sit up on the side of the bed, but resisting standing up or further mobility, likely due to fatigue. PT and pt fiance spent significant amount of time trying to convince pt to get out of bed and played music to attempt to elicit participation, but ultimately pt lying self back down. Pt fiance stating he sat up for ~7 hours yesterday int he chair. Pt may benefit from morning appointment in future.     Follow Up Recommendations  SNF;Supervision/Assistance - 24 hour     Equipment Recommendations  Rolling walker with 5" wheels;3in1 (PT);Wheelchair (measurements PT);Wheelchair cushion (measurements PT)    Recommendations for Other Services       Precautions / Restrictions Precautions Precautions: Fall Precaution Comments: g tube Restrictions Weight Bearing Restrictions: No    Mobility  Bed Mobility Overal bed mobility: Needs Assistance Bed Mobility: Sit to Supine;Supine to Sit     Supine to sit: Max assist Sit to supine: Supervision   General bed mobility comments: Pt requiring maxA for initiation as he was resisiting. Supervision to lie back down    Transfers                 General transfer comment: pt resisting therefore did not successfully perform  Ambulation/Gait                 Stairs             Wheelchair Mobility    Modified Rankin (Stroke Patients Only)       Balance Overall balance assessment: Needs  assistance Sitting-balance support: Feet supported Sitting balance-Leahy Scale: Good                                      Cognition Arousal/Alertness: Awake/alert Behavior During Therapy: WFL for tasks assessed/performed Overall Cognitive Status: Impaired/Different from baseline Area of Impairment: Attention;Following commands;Problem solving;Awareness;Safety/judgement                 Orientation Level: Disoriented to;Place;Time;Situation Current Attention Level: Selective Memory: Decreased short-term memory;Decreased recall of precautions Following Commands: Follows one step commands consistently;Follows multi-step commands with increased time Safety/Judgement: Decreased awareness of safety Awareness: Intellectual Problem Solving: Slow processing;Difficulty sequencing;Decreased initiation;Requires verbal cues;Requires tactile cues        Exercises      General Comments        Pertinent Vitals/Pain Pain Assessment: Faces Faces Pain Scale: No hurt    Home Living                      Prior Function            PT Goals (current goals can now be found in the care plan section) Acute Rehab PT Goals Patient Stated Goal: SO is hoping he can get stronger and do more for himself Potential to Achieve Goals: Fair    Frequency  Min 3X/week      PT Plan Current plan remains appropriate    Co-evaluation              AM-PAC PT "6 Clicks" Mobility   Outcome Measure  Help needed turning from your back to your side while in a flat bed without using bedrails?: None Help needed moving from lying on your back to sitting on the side of a flat bed without using bedrails?: Total Help needed moving to and from a bed to a chair (including a wheelchair)?: A Little Help needed standing up from a chair using your arms (e.g., wheelchair or bedside chair)?: A Little Help needed to walk in hospital room?: A Little Help needed climbing 3-5 steps  with a railing? : A Lot 6 Click Score: 16    End of Session   Activity Tolerance: Other (comment) (self limiting) Patient left: in bed;with call bell/phone within reach;with family/visitor present;with bed alarm set Nurse Communication: Mobility status PT Visit Diagnosis: Other abnormalities of gait and mobility (R26.89);Difficulty in walking, not elsewhere classified (R26.2)     Time: 6834-1962 PT Time Calculation (min) (ACUTE ONLY): 20 min  Charges:  $Therapeutic Activity: 8-22 mins                     Wyona Almas, PT, DPT Acute Rehabilitation Services Pager [phone] Office [phone]    Deno Etienne 10/16/2020, 5:07 PM

## 2020-10-16 NOTE — Progress Notes (Signed)
[facility] PROGRESS NOTE  Antonio Navarro OIN:[id number] DOB: [birth year] DOA: 07/22/2020 PCP: Patient, No Pcp Per       Status: Remains inpatient appropriate because:Altered mental status and Unsafe d/c plan   Dispo: The patient is from: Home              Anticipated d/c is to: SNF              Anticipated d/c date is: > 3 days              Patient currently is medically stable to d/c.  Barriers to discharge: Medicaid and disability pending. No SNF bed offers.  No further nocturnal agitation or wandering behavior so hopeful will be eligible for SNF bed offer.  Patient able to ambulate 300 feet so hopefully he can eventually discharge home with family as long as they can provide appropriate 24/7 monitoring.   Code Status: Full Family Communication: VM left for sister on 2/23 explaining plans to decrease p.m. Seroquel dose due to her concerns over excessive a.m. sleepiness. DVT prophylaxis: sq heparin Vaccination status: Completed both doses of Pfizer Covid vaccination on 12/14/2019; no option for [employer] booster at this facility therefore [place] [facility] to ordered on 09/21/2020 but was not given  Foley catheter: No  HPI: 60 year old male history of hypertension prescribed Norvasc prior to admission, chronic back pain, prior renal calculi who experienced an out of hospital cardiac arrest with downtime of 9 minutes.  Initial rhythm was ventricular fibrillation.  He was defibrillated deceived 6 doses of epi before ROSC.  Subsequent work-up revealed STEMI.  He underwent cardiac catheterization with a DES placed to the circumflex.  Cardiology following and has placed patient on Brilinta and aspirin and is also being treated with beta-blocker and statins.  From a neurological standpoint he clinically has anoxic brain injury.  During this hospitalization he experienced AKI and was treated with fluids and correction of hypoperfusion from cardiac arrest.  Echo performed this admission revealed  ischemic cardiomyopathy with an EF of 40%  In addition to above patient has been unable to pass swallowing evaluations and subsequently has undergone percutaneous PEG tube placement on 12/30.  Patient is awake and moving all extremities spontaneously but does not follow commands and does not verbalize palliative care has met with patient and family and has emphasized poor prognosis and will continue to meet with the family during the hospitalization.  Current plan is to transition to SNF bed once available.  Expect need for lifelong 24/7 care for all ADLs.  Subjective: Awake, alert.  Follows simple commands.  Nonverbal today.  Documented increase in oral intake.  Objective: Vitals:   10/15/20 2031 10/16/20 0249  BP: (!) 163/87 116/85  Pulse: 66 72  Resp: 18 17  Temp: 98 F (36.7 C) 98.8 F (37.1 C)  SpO2: 100% 97%    Intake/Output Summary (Last 24 hours) at 10/16/2020 0740 Last data filed at 10/16/2020 0000 Gross per 24 hour  Intake 460 ml  Output 1550 ml  Net -1090 ml   Filed Weights   10/14/20 0500 10/15/20 0000 10/16/20 0249  Weight: 86.8 kg 86.5 kg 86.7 kg    Exam: Constitutional: Alert, calm, no acute distress Heart: Normal heart sounds, pulse regular, no peripheral edema Respiratory: Lungs are clear, room air Abdomen:  D2 diet with improved intake-PEG tube for nocturnal tube feedings/medication. LBM 2/26 Neurologic: CN 2-12 roughly without any appreciable focal neurological deficits, MOE x4, strength is 5/5.   Psychiatric: Awake,  oriented times name. Aware he is not at home. Skin: Multiple flat white patches on left side of back extending down to buttocks up to the mid back. The areas have subsequently developed some mild pink/periwound redness.  They remain dry.  There is a more confluent area in the mid back that is now covered with a dressing.  Wound care assisting   Assessment/Plan: Acute problems: Out of hospital cardiac arrest secondary to STEMI -ROSC and  subsequent catheterization revealed disease in circumflex requiring DES -Continue beta-blocker, statin, Plavix, isosorbide and hydralazine  Significant anoxic brain injury secondary to cardiac arrest/deconditioning/gait instability -Consistently participating with PT and OT.  SLP has cleared for dysphagia 2 with thin liquids -Declined by CIR since family unable to provide 24/7 care.  Plan is for SNF -Continue Seroquel 100 mg at at bedtime -2/18 add Seroquel 25 mg every 12 hours as needed for agitation and wandering -Continue daytime Symmetrel  **PT NOTES FROM 2/21**  Pt progressing steadily towards his physical therapy goals. Pt with increased verbalizations; requiring repetition at times to follow and maintain attention or following commands during tasks. Pt requiring min assist for functional mobility. Ambulating x 300 feet with a walker. Pt continues with weakness, poor standing balance, cognitive deficits. Continue to recommend SNF for ongoing Physical Therapy.     White lesions/rash on back and buttocks -Appeared after patient had laying in tube feeding    10/10/20                           10/10/20   10/16/2020 -Wounds have spread to mid back and more confluent therefore wound care nurse consulted with recommendations made.  Dressing placed over mid back wounds.  Situational depression -Started Celexa 2/16-increased to 20 mg 2/18  Ischemic cardiomyopathy -Echo this admission EF 40% -Continue hydralazine and Isordil -No ACE inhibitor secondary to underlying kidney disease -Will need follow-up echo 3 months post initial echo which was completed on 07/23/2020  Hypertension -Was on Norvasc prior to admission -Continue cardiovascular meds as listed above  Acute on chronic kidney disease stage IIIb -Suspect prerenal etiology in the setting of hypoperfusion from ventricular fibrillation arrest -Baseline creatinine between 1.6 and 2; creatinine on 12/19 was 1.67-will repeat in a.m. on  08/19/2020 -Continue free water per tube and intermittently follow labs  Dysphagia, moderate protein calorie malnutrition Nutrition Problem: Inadequate oral intake Etiology: inability to eat Signs/Symptoms: NPO status  -Continue D2 diet noting patient with improved oral intake.  Will discontinue nocturnal tube feedings for now continue to follow -Add Ensure beverage twice daily between meals. Interventions: Tube feeding,Prostat Estimated body mass index is 25.92 kg/m as calculated from the following:   Height as of this encounter: 6' (1.829 m).   Weight as of this encounter: 86.7 kg.     Other problems: Acute hypernatremia -Continue free water per tube  E. coli UTI -Has completed 5 days of Bactrim  Hyperglycemia without an underlying diagnosis of diabetes mellitus Resolved -Likely related to acute stress -Recent issues with hypoglycemia so sliding scale insulin decreased to very sensitive and Levemir decreased from 30 twice daily to 15 twice daily as of 1/14 -Hemoglobin A1c 4.7   Acute hypoxemic respiratory failure -Resolved -Stable on room air with O2 sats between 97 and 99%  Nonsustained VT -Continue amiodarone and beta-blocker -1/31 LFTs normal  Skin tear right lateral back Wound / Incision (Open or Dehisced) 07/28/20 Skin tear Back Lateral;Right;Upper (Active)  Date First Assessed/Time First  Assessed: 07/28/20 0300   Wound Type: Skin tear  Location: Back  Location Orientation: Lateral;Right;Upper  Present on Admission: No    Assessments 07/28/2020  7:49 PM 10/15/2020  8:30 PM  Dressing Type None --  Dressing Status -- None  Site / Wound Assessment -- Clean;Dry;Pink  Drainage Amount None --  Treatment Cleansed --     No Linked orders to display    Data Reviewed: Basic Metabolic Panel: Recent Labs  Lab 10/14/20 0737  NA 137  K 4.3  CL 100  CO2 28  GLUCOSE 161*  BUN 16  CREATININE 1.15  CALCIUM 9.3   Liver Function Tests: No results for input(s): AST,  ALT, ALKPHOS, BILITOT, PROT, ALBUMIN in the last 168 hours. No results for input(s): LIPASE, AMYLASE in the last 168 hours. No results for input(s): AMMONIA in the last 168 hours. CBC: Recent Labs  Lab 10/14/20 0737  WBC 9.9  HGB 12.4*  HCT 38.4*  MCV 95.5  PLT 200   Cardiac Enzymes: No results for input(s): CKTOTAL, CKMB, CKMBINDEX, TROPONINI in the last 168 hours. BNP (last 3 results) No results for input(s): BNP in the last 8760 hours.  ProBNP (last 3 results) No results for input(s): PROBNP in the last 8760 hours.  CBG: Recent Labs  Lab 10/15/20 0609 10/15/20 1156 10/15/20 1618 10/16/20 0040 10/16/20 0639  GLUCAP 175* 151* 146* 165* 168*    No results found for this or any previous visit (from the past 240 hour(s)).   Studies: No results found.  Scheduled Meds: . amantadine  100 mg Per Tube Daily  . aspirin  81 mg Per Tube Daily  . atorvastatin  80 mg Per Tube Daily  . carvedilol  25 mg Per Tube BID WC  . chlorhexidine gluconate (MEDLINE KIT)  15 mL Mouth Rinse BID  . citalopram  20 mg Per Tube Daily  . clopidogrel  75 mg Per Tube Daily  . COVID-19 mRNA vaccine (Moderna)  0.25 mL Intramuscular Once  . enoxaparin (LOVENOX) injection  40 mg Subcutaneous Q24H  . feeding supplement (PROSource TF)  45 mL Per Tube BID  . fluconazole  100 mg Oral Daily  . free water  200 mL Per Tube Q2H  . Gerhardt's butt cream   Topical TID  . hydrALAZINE  25 mg Per Tube Q8H  . insulin aspart  0-15 Units Subcutaneous Q6H  . isosorbide dinitrate  10 mg Per Tube TID  . mouth rinse  15 mL Mouth Rinse BID  . polyvinyl alcohol  1 drop Both Eyes QID  . QUEtiapine  25 mg Per Tube Daily  . sodium chloride flush  3 mL Intravenous Q12H   Continuous Infusions: . feeding supplement (JEVITY 1.5 CAL/FIBER) 780 mL (10/15/20 2221)    Active Problems:   Acute ST elevation myocardial infarction (STEMI) (HCC)   Cardiac arrest (HCC)   Elevated blood pressure reading with diagnosis of  hypertension   Shock circulatory (HCC)   Encephalopathy   History of ETT   Cardiomyopathy, ischemic   Anoxic brain damage ([place])   Dysphagia   DNR (do not resuscitate) discussion   Gait instability   E. coli UTI   Consultants:  Cardiology  Interventional radiology  PCCM  Neurology  Procedures:  Cardiac catheterization 12/4  Continuous EEG 12/4 and overnight EEG with video 12/5  Echocardiogram 12/5  EEG 12/9  Percutaneous PEG tube placement 12/30  Antibiotics: Anti-infectives (From admission, onward)   Start     Dose/Rate Route Frequency  Ordered Stop   10/14/20 1000  fluconazole (DIFLUCAN) tablet 100 mg        100 mg Oral Daily 10/14/20 0848 10/19/20 0959   09/13/20 1000  sulfamethoxazole-trimethoprim (BACTRIM) 200-40 MG/5ML suspension 20 mL        20 mL Per Tube Every 12 hours 09/13/20 0734 09/19/20 0926   09/13/20 0745  cefTRIAXone (ROCEPHIN) 1 g in sodium chloride 0.9 % 100 mL IVPB  Status:  Discontinued        1 g 200 mL/hr over 30 Minutes Intravenous Every 24 hours 09/13/20 0658 09/13/20 0734   08/17/20 0000  ceFAZolin (ANCEF) IVPB 2g/100 mL premix        2 g 200 mL/hr over 30 Minutes Intravenous To Radiology 08/15/20 1346 08/17/20 1111   07/22/20 0830  cefTRIAXone (ROCEPHIN) 2 g in sodium chloride 0.9 % 100 mL IVPB        2 g 200 mL/hr over 30 Minutes Intravenous Every 24 hours 07/22/20 0720 07/26/20 0858       Time spent: 20 minutes    Erin Hearing ANP  [facility] 7 am - 330 pm/M-F for direct patient care and secure chat Please refer to Amion for contact information 86 days

## 2020-10-16 NOTE — TOC Progression Note (Addendum)
Transition of Care [facility]) - Progression Note    Patient Details  Name: PILOT PRINDLE MRN: [id number] Date of Birth: [birth year]  [facility] CM/SW Contact  Curlene Labrum, RN Phone Number: 10/16/2020, 12:44 PM  Clinical Narrative:    Case management met with the patient at the bedside regarding transitions of care - patient will need SNF placement and possible LTC placement for care.  The patient was pleasant and was able to roll over in the bed this morning for assessment of skin rash on back and buttocks.  PEG tube feeds was placed on hold at this time to allow the patient to have ordered diet by mouth per Erin Hearing, NP.  I called and left a message with Colletta Maryland, CM at [facility] in [place] to inquire about bed offer for SNF paid through [facility] until Medicaid is approved for payment.  CM will continue to follow the patient for admission to SNF facility.  10/16/2020 1336 - Spoke with Martinique Boone, [place] at [facility] in [place], [place] and she was willing to view the patient's clinicals for admission for SNF placement - emailed and clinicals securely to [email or web address]   Expected Discharge Plan: Skilled Nursing Facility Barriers to Discharge: Continued Medical Work up,No SNF bed  Expected Discharge Plan and Services Expected Discharge Plan: [place] arrangements for the past 2 months: Single Family Home                                       Social Determinants of Health (SDOH) Interventions    Readmission Risk Interventions No flowsheet data found.

## 2020-10-17 LAB — GLUCOSE, CAPILLARY
Glucose-Capillary: 107 mg/dL — ABNORMAL HIGH (ref 70–99)
Glucose-Capillary: 112 mg/dL — ABNORMAL HIGH (ref 70–99)
Glucose-Capillary: 117 mg/dL — ABNORMAL HIGH (ref 70–99)
Glucose-Capillary: 152 mg/dL — ABNORMAL HIGH (ref 70–99)

## 2020-10-17 NOTE — Progress Notes (Addendum)
[facility] PROGRESS NOTE  ONEAL SCHOENBERGER DZH:[id number] DOB: August 16, 1961 DOA: 07/22/2020 PCP: Patient, No Pcp Per       Status: Remains inpatient appropriate because:Altered mental status and Unsafe d/c plan   Dispo: The patient is from: Home              Anticipated d/c is to: SNF              Anticipated d/c date is: > 3 days              Patient currently is medically stable to d/c.  Barriers to discharge: Medicaid and disability pending. No SNF bed offers.  No further nocturnal agitation or wandering behavior so hopeful will be eligible for SNF bed offer.  Patient able to ambulate 300 feet so hopefully he can eventually discharge home with family as long as they can provide appropriate 24/7 monitoring.  On 2/28 TOC contacted at [facility]   Code Status: Full Family Communication: Sister [name] on 3/1; she verbalized frustration over lack of regular communication and only receiving information once weekly.  Also did not understand how if patient is unable to communicate that he is depressed why staff would feel he would be depressed.  Explained to her behaviors we had noticed that were a change for him including sleeping more than usual and wandering behaviors at night with him telling the staff he was trying to go home.  Sister stated she thought that this was interesting since he had never spoken to her consistently while she was in the room.  Again explained to her that when people have brain injuries that they may not be able to express their needs and emotions but we must be alert to changes in behavior that would be consistent with depression.  The goal for pharmacotherapy short-term with hopes of increasing serotonin availability to improve affect.  Discussed patient's rash on his back but she states she had not been alerted to.  Apologized for lack of communication.  She reported that her other sister Garner Dullea also visits regularly and request that we speak to  her in addition to only speaking to [facility].  In regards to discharge disposition told her that right now we have sent out information to a nursing facility in [place].  She stated that family would prefer the [place] area.  She is also perplexed as to why we have not gotten any nursing home offers.  Did not discuss issues related to allergies and no Medicaid but did focus on other issues that are ongoing in the community in regards to COVID and lack of nursing home staff and nursing home bed availability. DVT prophylaxis: sq heparin Vaccination status: Completed both doses of Pfizer Covid vaccination on 12/14/2019; no option for [employer] booster at this facility therefore [facility] to ordered on 09/21/2020 but was not given  Foley catheter: No  HPI: 60 year old male history of hypertension prescribed Norvasc prior to admission, chronic back pain, prior renal calculi who experienced an out of hospital cardiac arrest with downtime of 9 minutes.  Initial rhythm was ventricular fibrillation.  He was defibrillated deceived 6 doses of epi before ROSC.  Subsequent work-up revealed STEMI.  He underwent cardiac catheterization with a DES placed to the circumflex.  Cardiology following and has placed patient on Brilinta and aspirin and is also being treated with beta-blocker and statins.  From a neurological standpoint he clinically has anoxic brain injury.  During this hospitalization he experienced AKI  and was treated with fluids and correction of hypoperfusion from cardiac arrest.  Echo performed this admission revealed ischemic cardiomyopathy with an EF of 40%  In addition to above patient has been unable to pass swallowing evaluations and subsequently has undergone percutaneous PEG tube placement on 12/30.  Patient is awake and moving all extremities spontaneously but does not follow commands and does not verbalize palliative care has met with patient and family and has emphasized poor prognosis and  will continue to meet with the family during the hospitalization.  Current plan is to transition to SNF bed once available.  Expect need for lifelong 24/7 care for all ADLs.  Subjective: Awake.  No complaints.  When asked if he felt more hungry now that we have stopped the tube feedings he nodded yes.  Objective: Vitals:   10/16/20 2233 10/17/20 0358  BP: 131/83 (!) 135/93  Pulse: 78 73  Resp: 18 16  Temp: 98.2 F (36.8 C) 98.4 F (36.9 C)  SpO2: 99% 98%    Intake/Output Summary (Last 24 hours) at 10/17/2020 0749 Last data filed at 10/17/2020 0355 Gross per 24 hour  Intake 1150 ml  Output 2000 ml  Net -850 ml   Filed Weights   10/15/20 0000 10/16/20 0249 10/17/20 0355  Weight: 86.5 kg 86.7 kg 84.6 kg    Exam: Constitutional: Awake, alert and in no distress Heart: Heart sounds, no peripheral edema, pulses regular Respiratory: Bilateral lung sounds are clear and he is stable on room air Abdomen:  D2 diet with improved intake-PEG tube for meds- LBM 2/26 Neurologic: CN 2-12 roughly without any appreciable focal neurological deficits, MOE x4, strength is 5/5.   Psychiatric: Awake, oriented times name. Aware he is not at home. Skin: Multiple flat dry areas on back w/ a more confluent area mid back that is covered by a dressing   Assessment/Plan: Acute problems: Out of hospital cardiac arrest secondary to STEMI -ROSC and subsequent catheterization revealed disease in circumflex requiring DES -Continue beta-blocker, statin, Plavix, isosorbide and hydralazine  Significant anoxic brain injury secondary to cardiac arrest/deconditioning/gait instability -Consistently participating with PT and OT.  SLP has cleared for dysphagia 2 with thin liquids -Declined by CIR since family unable to provide 24/7 care.  Plan is for SNF -Continue Seroquel 100 mg at at bedtime -2/18 add Seroquel 25 mg every 12 hours as needed for agitation and wandering -Continue daytime Symmetrel  **PT NOTES FROM  2/21**  Pt progressing steadily towards his physical therapy goals. Pt with increased verbalizations; requiring repetition at times to follow and maintain attention or following commands during tasks. Pt requiring min assist for functional mobility. Ambulating x 300 feet with a walker. Pt continues with weakness, poor standing balance, cognitive deficits. Continue to recommend SNF for ongoing Physical Therapy.     White lesions/rash on back and buttocks -Appeared after patient had laying in tube feeding    10/10/20                           10/10/20   10/16/2020 -Wounds have spread to mid back and more confluent therefore wound care nurse consulted with recommendations made.  Dressing placed over mid back wounds.  Situational depression -Started Celexa 2/16-increased to 20 mg 2/18  Ischemic cardiomyopathy -Echo this admission EF 40% -Continue hydralazine and Isordil -No ACE inhibitor secondary to underlying kidney disease -Will need follow-up echo 3 months post initial echo which was completed on 07/23/2020  Hypertension -Was  on Norvasc prior to admission -Continue cardiovascular meds as listed above  Acute on chronic kidney disease stage IIIb -Suspect prerenal etiology in the setting of hypoperfusion from ventricular fibrillation arrest -Baseline creatinine between 1.6 and 2; creatinine on 12/19 was 1.67-will repeat in a.m. on 08/19/2020 -Continue free water per tube and intermittently follow labs  Dysphagia, moderate protein calorie malnutrition Nutrition Problem: Inadequate oral intake Etiology: inability to eat Signs/Symptoms: NPO status  -Continue D2 diet noting patient with improved oral intake.  Will discontinue nocturnal tube feedings for now continue to follow -Add Ensure beverage twice daily between meals. Interventions: Tube feeding,Prostat Estimated body mass index is 25.3 kg/m as calculated from the following:   Height as of this encounter: 6' (1.829 m).   Weight as of  this encounter: 84.6 kg.     Other problems: Acute hypernatremia -Continue free water per tube  E. coli UTI -Has completed 5 days of Bactrim  Hyperglycemia without an underlying diagnosis of diabetes mellitus Resolved -Likely related to acute stress -Recent issues with hypoglycemia so sliding scale insulin decreased to very sensitive and Levemir decreased from 30 twice daily to 15 twice daily as of 1/14 -Hemoglobin A1c 4.7   Acute hypoxemic respiratory failure -Resolved -Stable on room air with O2 sats between 97 and 99%  Nonsustained VT -Continue amiodarone and beta-blocker -1/31 LFTs normal  Skin tear right lateral back Wound / Incision (Open or Dehisced) 07/28/20 Skin tear Back Lateral;Right;Upper (Active)  Date First Assessed/Time First Assessed: 07/28/20 0300   Wound Type: Skin tear  Location: Back  Location Orientation: Lateral;Right;Upper  Present on Admission: No    Assessments 07/28/2020  7:49 PM 10/16/2020  8:00 PM  Dressing Type None Foam - Lift dressing to assess site every shift  Dressing Status -- Clean;Dry;Intact  Drainage Amount None --  Treatment Cleansed --     No Linked orders to display    Data Reviewed: Basic Metabolic Panel: Recent Labs  Lab 10/14/20 0737  NA 137  K 4.3  CL 100  CO2 28  GLUCOSE 161*  BUN 16  CREATININE 1.15  CALCIUM 9.3   Liver Function Tests: No results for input(s): AST, ALT, ALKPHOS, BILITOT, PROT, ALBUMIN in the last 168 hours. No results for input(s): LIPASE, AMYLASE in the last 168 hours. No results for input(s): AMMONIA in the last 168 hours. CBC: Recent Labs  Lab 10/14/20 0737  WBC 9.9  HGB 12.4*  HCT 38.4*  MCV 95.5  PLT 200   Cardiac Enzymes: No results for input(s): CKTOTAL, CKMB, CKMBINDEX, TROPONINI in the last 168 hours. BNP (last 3 results) No results for input(s): BNP in the last 8760 hours.  ProBNP (last 3 results) No results for input(s): PROBNP in the last 8760 hours.  CBG: Recent Labs   Lab 10/16/20 1142 10/16/20 1720 10/16/20 2138 10/17/20 0036 10/17/20 0542  GLUCAP 111* 99 101* 112* 117*    No results found for this or any previous visit (from the past 240 hour(s)).   Studies: No results found.  Scheduled Meds: . amantadine  100 mg Per Tube Daily  . aspirin  81 mg Per Tube Daily  . atorvastatin  80 mg Per Tube Daily  . carvedilol  25 mg Per Tube BID WC  . chlorhexidine gluconate (MEDLINE KIT)  15 mL Mouth Rinse BID  . citalopram  20 mg Per Tube Daily  . clopidogrel  75 mg Per Tube Daily  . COVID-19 mRNA vaccine (Moderna)  0.25 mL Intramuscular Once  .  enoxaparin (LOVENOX) injection  40 mg Subcutaneous Q24H  . feeding supplement  237 mL Oral BID BM  . feeding supplement (PROSource TF)  45 mL Per Tube BID  . fluconazole  100 mg Oral Daily  . free water  200 mL Per Tube Q2H  . Gerhardt's butt cream   Topical TID  . hydrALAZINE  25 mg Per Tube Q8H  . insulin aspart  0-15 Units Subcutaneous Q6H  . isosorbide dinitrate  10 mg Per Tube TID  . mouth rinse  15 mL Mouth Rinse BID  . polyvinyl alcohol  1 drop Both Eyes QID  . QUEtiapine  25 mg Per Tube Daily  . sodium chloride flush  3 mL Intravenous Q12H   Continuous Infusions:   Active Problems:   Acute ST elevation myocardial infarction (STEMI) [facility])   Cardiac arrest (HCC)   Elevated blood pressure reading with diagnosis of hypertension   Shock circulatory (HCC)   Encephalopathy   History of ETT   Cardiomyopathy, ischemic   Anoxic brain damage ([place])   Dysphagia   DNR (do not resuscitate) discussion   Gait instability   E. coli UTI   Consultants:  Cardiology  Interventional radiology  PCCM  Neurology  Procedures:  Cardiac catheterization 12/4  Continuous EEG 12/4 and overnight EEG with video 12/5  Echocardiogram 12/5  EEG 12/9  Percutaneous PEG tube placement 12/30  Antibiotics: Anti-infectives (From admission, onward)   Start     Dose/Rate Route Frequency Ordered Stop    10/14/20 1000  fluconazole (DIFLUCAN) tablet 100 mg        100 mg Oral Daily 10/14/20 0848 10/19/20 0959   09/13/20 1000  sulfamethoxazole-trimethoprim (BACTRIM) 200-40 MG/5ML suspension 20 mL        20 mL Per Tube Every 12 hours 09/13/20 0734 09/19/20 0926   09/13/20 0745  cefTRIAXone (ROCEPHIN) 1 g in sodium chloride 0.9 % 100 mL IVPB  Status:  Discontinued        1 g 200 mL/hr over 30 Minutes Intravenous Every 24 hours 09/13/20 0658 09/13/20 0734   08/17/20 0000  ceFAZolin (ANCEF) IVPB 2g/100 mL premix        2 g 200 mL/hr over 30 Minutes Intravenous To Radiology 08/15/20 1346 08/17/20 1111   07/22/20 0830  cefTRIAXone (ROCEPHIN) 2 g in sodium chloride 0.9 % 100 mL IVPB        2 g 200 mL/hr over 30 Minutes Intravenous Every 24 hours 07/22/20 0720 07/26/20 0858       Time spent: 20 minutes    Erin Hearing ANP  [facility] 7 am - 330 pm/M-F for direct patient care and secure chat Please refer to Amion for contact information 87 days

## 2020-10-17 NOTE — Progress Notes (Signed)
Occupational Therapy Treatment Patient Details Name: Antonio Navarro MRN: [id number] DOB: [birth year] Today's Date: 10/17/2020    History of present illness [name] with history of tobacco abuse, obesity, HTN who was admitted 07/22/20 post cardiac arrest with inferolateral STEMI. He was resuscitated in the field by EMS after being found to be in V fib. Approximately 30 minutes of CPR. He was intubated on arrival.  Pt with anoxic brain injury. PEG placed on 08/17/20.   OT comments  Pt making gradual progress towards OT goals this session. Pt more resistant to OT session today with pt needing MOD A to transition to EOB and MAX A +1 to power into standing with RW x2, pt unable to take steps this session and returned self to supine with supervision. Pt seemed much more flat and internally distracted this session, following commands inconsistently. DC plan currently remains appropriate, will continue to follow acutely per POC.    Follow Up Recommendations  SNF;Supervision/Assistance - 24 hour    Equipment Recommendations  Wheelchair (measurements OT);Wheelchair cushion (measurements OT);Hospital bed;3 in 1 bedside commode    Recommendations for Other Services      Precautions / Restrictions Precautions Precautions: Fall Precaution Comments: g tube Restrictions Weight Bearing Restrictions: No       Mobility Bed Mobility Overal bed mobility: Needs Assistance Bed Mobility: Supine to Sit;Sit to Supine     Supine to sit: Mod assist Sit to supine: Supervision   General bed mobility comments: MOD A to elevate trunk into sitting with pt able to maneuver BLEs to EOB, pt returning self to supine with supervision    Transfers Overall transfer level: Needs assistance Equipment used: Rolling walker (2 wheeled) Transfers: Sit to/from Stand Sit to Stand: Max assist;From elevated surface         General transfer comment: pt required MAX A +1 to power into standing from EOB with pt  unable to take steps this session    Balance Overall balance assessment: Needs assistance Sitting-balance support: Feet supported Sitting balance-Leahy Scale: Good Sitting balance - Comments: sitting EOB with supervision with no LOB   Standing balance support: Bilateral upper extremity supported;During functional activity Standing balance-Leahy Scale: Poor Standing balance comment: reliant on BUE support this session                           ADL either performed or assessed with clinical judgement   ADL Overall ADL's : Needs assistance/impaired                 Upper Body Dressing : Minimal assistance;Sitting Upper Body Dressing Details (indicate cue type and reason): to don gown as back side cover     Toilet Transfer: Maximal assistance;RW Toilet Transfer Details (indicate cue type and reason): sit<>stand only from EOB with pt resistant to movement needing MAX A +1 to power into standing with Rw         Functional mobility during ADLs: Maximal assistance;Rolling walker (sit<>stand only) General ADL Comments: pt more resistant to therapy this session with pt needing MAX A +1 to complete x2 sit<>stand from EOB before pt returned self to supine     Vision       Perception     Praxis      Cognition Arousal/Alertness: Awake/alert Behavior During Therapy: Flat affect Overall Cognitive Status: Impaired/Different from baseline Area of Impairment: Attention;Following commands;Problem solving;Awareness;Safety/judgement;Memory  Current Attention Level: Selective Memory: Decreased short-term memory Following Commands: Follows multi-step commands inconsistently;Follows one step commands with increased time Safety/Judgement: Decreased awareness of safety Awareness: Intellectual Problem Solving: Slow processing;Difficulty sequencing;Decreased initiation;Requires verbal cues;Requires tactile cues General Comments: pt with increased  difficulty following commands this session with pt much more flat and resistant to therapy        Exercises     Shoulder Instructions       General Comments pts fiance present during session    Pertinent Vitals/ Pain       Pain Assessment: Faces Faces Pain Scale: No hurt  Home Living                                          Prior Functioning/Environment              Frequency  Min 2X/week        Progress Toward Goals  OT Goals(current goals can now be found in the care plan section)  Progress towards OT goals: Progressing toward goals  Acute Rehab OT Goals Patient Stated Goal: SO is hoping he can get stronger and do more for himself OT Goal Formulation: With patient/family Time For Goal Achievement: 10/26/20 Potential to Achieve Goals: [place] Discharge plan remains appropriate;Frequency remains appropriate    Co-evaluation                 AM-PAC OT "6 Clicks" Daily Activity     Outcome Measure   Help from another person eating meals?: A Little Help from another person taking care of personal grooming?: A Little Help from another person toileting, which includes using toliet, bedpan, or urinal?: A Lot Help from another person bathing (including washing, rinsing, drying)?: A Lot Help from another person to put on and taking off regular upper body clothing?: A Little Help from another person to put on and taking off regular lower body clothing?: A Lot 6 Click Score: 15    End of Session Equipment Utilized During Treatment: Gait belt;Rolling walker  OT Visit Diagnosis: Muscle weakness (generalized) (M62.81);Pain;Other abnormalities of gait and mobility (R26.89);Cognitive communication deficit (R41.841);Low vision, both eyes (H54.2) Symptoms and signs involving cognitive functions: Other Nontraumatic ICH   Activity Tolerance Other (comment) (resistant to participation)   Patient Left in bed;with call bell/phone within  reach;with bed alarm set;with family/visitor present   Nurse Communication Mobility status        Time: 1417-1431 OT Time Calculation (min): 14 min  Charges: OT General Charges $OT Visit: 1 Visit OT Treatments $Therapeutic Activity: 8-22 mins Antonio Navarro., COTA/L Acute Rehabilitation Services [place] 10/17/2020, 3:54 PM

## 2020-10-17 NOTE — Progress Notes (Signed)
Pulled off replaced with a size 25

## 2020-10-18 LAB — GLUCOSE, CAPILLARY
Glucose-Capillary: 100 mg/dL — ABNORMAL HIGH (ref 70–99)
Glucose-Capillary: 115 mg/dL — ABNORMAL HIGH (ref 70–99)
Glucose-Capillary: 116 mg/dL — ABNORMAL HIGH (ref 70–99)
Glucose-Capillary: 133 mg/dL — ABNORMAL HIGH (ref 70–99)

## 2020-10-18 MED ORDER — ACETAMINOPHEN 325 MG PO TABS
650.0000 mg | ORAL_TABLET | Freq: Four times a day (QID) | ORAL | Status: DC | PRN
  Filled 2020-10-18: qty 2

## 2020-10-18 MED ORDER — CLOPIDOGREL BISULFATE 75 MG PO TABS
75.0000 mg | ORAL_TABLET | Freq: Every day | ORAL | Status: DC
  Administered 2020-10-19 – 2020-11-09 (×22): 75 mg via ORAL
  Filled 2020-10-18 (×24): qty 1

## 2020-10-18 MED ORDER — DOCUSATE SODIUM 50 MG/5ML PO LIQD
100.0000 mg | Freq: Two times a day (BID) | ORAL | Status: DC | PRN
  Filled 2020-10-18: qty 10

## 2020-10-18 MED ORDER — ATORVASTATIN CALCIUM 80 MG PO TABS
80.0000 mg | ORAL_TABLET | Freq: Every day | ORAL | Status: DC
  Administered 2020-10-19 – 2020-11-08 (×21): 80 mg via ORAL
  Filled 2020-10-18 (×22): qty 1

## 2020-10-18 MED ORDER — AMANTADINE HCL 50 MG/5ML PO SOLN
100.0000 mg | Freq: Every day | ORAL | Status: DC
  Administered 2020-10-19 – 2020-11-09 (×22): 100 mg via ORAL
  Filled 2020-10-18 (×23): qty 10

## 2020-10-18 MED ORDER — OSMOLITE 1.5 CAL PO LIQD: 237.0000 mL | Freq: Three times a day (TID) | ORAL | Status: DC

## 2020-10-18 MED ORDER — CARVEDILOL 25 MG PO TABS
25.0000 mg | ORAL_TABLET | Freq: Two times a day (BID) | ORAL | Status: DC
  Administered 2020-10-18 – 2020-11-06 (×39): 25 mg via ORAL
  Filled 2020-10-18 (×40): qty 1

## 2020-10-18 MED ORDER — ISOSORBIDE DINITRATE 10 MG PO TABS
10.0000 mg | ORAL_TABLET | Freq: Three times a day (TID) | ORAL | Status: DC
  Administered 2020-10-18 – 2020-10-27 (×26): 10 mg via ORAL
  Filled 2020-10-18 (×26): qty 1

## 2020-10-18 MED ORDER — HYDRALAZINE HCL 25 MG PO TABS
25.0000 mg | ORAL_TABLET | Freq: Three times a day (TID) | ORAL | Status: DC
  Administered 2020-10-18 – 2020-10-27 (×25): 25 mg via ORAL
  Filled 2020-10-18 (×28): qty 1

## 2020-10-18 MED ORDER — POLYETHYLENE GLYCOL 3350 17 G PO PACK: 17.0000 g | PACK | Freq: Every day | ORAL | Status: DC | PRN

## 2020-10-18 MED ORDER — QUETIAPINE FUMARATE 25 MG PO TABS
25.0000 mg | ORAL_TABLET | ORAL | Status: DC
  Administered 2020-10-18 – 2020-11-09 (×23): 25 mg via ORAL
  Filled 2020-10-18 (×23): qty 1

## 2020-10-18 MED ORDER — ASPIRIN 81 MG PO CHEW
81.0000 mg | CHEWABLE_TABLET | Freq: Every day | ORAL | Status: DC
  Administered 2020-10-19 – 2020-11-09 (×22): 81 mg via ORAL
  Filled 2020-10-18 (×24): qty 1

## 2020-10-18 MED ORDER — CITALOPRAM HYDROBROMIDE 20 MG PO TABS
20.0000 mg | ORAL_TABLET | Freq: Every day | ORAL | Status: DC
  Administered 2020-10-19 – 2020-11-09 (×22): 20 mg via ORAL
  Filled 2020-10-18 (×24): qty 1

## 2020-10-18 MED ORDER — JEVITY 1.5 CAL/FIBER PO LIQD
237.0000 mL | Freq: Three times a day (TID) | ORAL | Status: DC
  Administered 2020-10-18 – 2020-10-20 (×5): 237 mL
  Filled 2020-10-18 (×2): qty 1000
  Filled 2020-10-18: qty 237
  Filled 2020-10-18 (×5): qty 1000

## 2020-10-18 NOTE — Progress Notes (Signed)
Patient has been sleepy over the last 3 days. Seroquel was adjusted.

## 2020-10-18 NOTE — Plan of Care (Signed)
  Problem: Nutrition: Goal: Adequate nutrition will be maintained Outcome: Progressing   Problem: Coping: Goal: Level of anxiety will decrease Outcome: Progressing   

## 2020-10-18 NOTE — Plan of Care (Signed)
  Problem: Activity: Goal: Risk for activity intolerance will decrease Outcome: Progressing Note: Walking in the hall

## 2020-10-18 NOTE — TOC Progression Note (Signed)
Transition of Care [facility]) - Progression Note    Patient Details  Name: Antonio Navarro MRN: [id number] Date of Birth: 11-04-1960  Transition of Care [facility]) CM/SW [place] [facility], RN Phone Number: 10/18/2020, 12:05 PM  Clinical Narrative:    Case management spoke with Martinique Boone, CM at [facility] in [place] and the facility is currently reviewing the patient's clinicals for possible bed offer of admission under an LOG from [facility] hospital while Medicaid and disability are pending with financial counseling.  Erin Hearing, NP is aware of patient's pending admission at an accepting SNF facility - and Lissa Merlin, NP updated the patient's sister, [name].  Either [place] SN[facility] facility [name] versus [facility]) may be an option for admission for this patient if the facility is able to provide care and acceptance of LOG for admission.  Expected Discharge Plan: Skilled Nursing Facility Barriers to Discharge: Continued Medical Work up,No SNF bed  Expected Discharge Plan and Services Expected Discharge Plan: [place] arrangements for the past 2 months: Single Family Home                                       Social Determinants of Health (SDOH) Interventions    Readmission Risk Interventions No flowsheet data found.

## 2020-10-18 NOTE — Progress Notes (Addendum)
[facility] PROGRESS NOTE  Antonio Navarro WGY:[id number] DOB: 11/28/1960 DOA: 07/22/2020 PCP: Patient, No Pcp Per       Status: Remains inpatient appropriate because:Altered mental status and Unsafe d/c plan   Dispo: The patient is from: Home              Anticipated d/c is to: SNF              Anticipated d/c date is: > 3 days              Patient currently is medically stable to d/c.  Barriers to discharge: Medicaid and disability pending. No SNF bed offers.  No further nocturnal agitation or wandering behavior so hopeful will be eligible for SNF bed offer.  Patient able to ambulate 300 feet so hopefully he can eventually discharge home with family as long as they can provide appropriate 24/7 monitoring.  On 2/28 TOC contacted at [place]   Code Status: Full Family Communication: Updated Sister [name] who works as a Recruitment consultant from 6 AM to 11 AM.  She is aware that major restriction in regarding placement has to do with Medicaid application still pending and many facilities are reluctant to accept a long-term patient without Medicaid in place. DVT prophylaxis: sq heparin Vaccination status: Completed both doses of Pfizer Covid vaccination on 12/14/2019; no option for [employer] booster at this facility therefore [facility] to ordered on 09/21/2020 but was not given  Foley catheter: No  HPI: 60 year old male history of hypertension prescribed Norvasc prior to admission, chronic back pain, prior renal calculi who experienced an out of hospital cardiac arrest with downtime of 9 minutes.  Initial rhythm was ventricular fibrillation.  He was defibrillated deceived 6 doses of epi before ROSC.  Subsequent work-up revealed STEMI.  He underwent cardiac catheterization with a DES placed to the circumflex.  Cardiology following and has placed patient on Brilinta and aspirin and is also being treated with beta-blocker and statins.  From a neurological standpoint he clinically has  anoxic brain injury.  During this hospitalization he experienced AKI and was treated with fluids and correction of hypoperfusion from cardiac arrest.  Echo performed this admission revealed ischemic cardiomyopathy with an EF of 40%  In addition to above patient has been unable to pass swallowing evaluations and subsequently has undergone percutaneous PEG tube placement on 12/30.  Patient is awake and moving all extremities spontaneously but does not follow commands and does not verbalize palliative care has met with patient and family and has emphasized poor prognosis and will continue to meet with the family during the hospitalization.  Current plan is to transition to SNF bed once available.  Expect need for lifelong 24/7 care for all ADLs.  Subjective: Awakened.  Sleepy but to tell me he was not having any abdominal discomfort or any other problems.  Informed him that I had updated his Sister Adrienne yesterday.  He smiled.  Objective: Vitals:   10/17/20 2000 10/18/20 0610  BP: 117/77 (!) 121/93  Pulse: 85 81  Resp: 18 20  Temp: 98.2 F (36.8 C) 98.3 F (36.8 C)  SpO2: 96% 95%    Intake/Output Summary (Last 24 hours) at 10/18/2020 0753 Last data filed at 10/18/2020 0617 Gross per 24 hour  Intake 480 ml  Output 1700 ml  Net -1220 ml   Filed Weights   10/16/20 0249 10/17/20 0355 10/18/20 0110  Weight: 86.7 kg 84.6 kg 84.2 kg    Exam: Constitutional: Calm,  no acute distress Heart: Heart sounds are normal, pulses regular nontachycardic, skin warm and dry Respiratory: Bilateral lung sounds clear to auscultation anteriorly, no increased work of breathing and he is stable on room air Abdomen:  D2 diet with improved intake-PEG tube for intermittent feedings and for medications if necessary.  LBM 2/26 Neurologic: CN 2-12 roughly without any appreciable focal neurological deficits, MOE x4, strength is 5/5.   Psychiatric: Awakened.  Oriented times name.  Able to communicate but clearly has  significant short-term memory deficits. Skin: Multiple flat dry areas on back w/ a more confluent area mid back that is covered by a dressing   Assessment/Plan: Acute problems: Out of hospital cardiac arrest secondary to STEMI -ROSC and subsequent catheterization revealed disease in circumflex requiring DES -Continue beta-blocker, statin, Plavix, isosorbide and hydralazine  Significant anoxic brain injury secondary to cardiac arrest/deconditioning/gait instability -Consistently participating with PT and OT.  SLP has cleared for dysphagia 2 with thin liquids -Declined by CIR since family unable to provide 24/7 care.  Plan is for SNF -Continue Seroquel 100 mg at at bedtime -2/18 add Seroquel 25 mg every 12 hours as needed for agitation and wandering -Continue daytime Symmetrel  **PT NOTES FROM 2/21**  Pt progressing steadily towards his physical therapy goals. Pt with increased verbalizations; requiring repetition at times to follow and maintain attention or following commands during tasks. Pt requiring min assist for functional mobility. Ambulating x 300 feet with a walker. Pt continues with weakness, poor standing balance, cognitive deficits. Continue to recommend SNF for ongoing Physical Therapy.     White lesions/rash on back and buttocks -Appeared after patient had laying in tube feeding    10/10/20                           10/10/20   10/16/2020 -Wounds have spread to mid back and more confluent therefore wound care nurse consulted with recommendations made.  Dressing placed over mid back wounds.  Situational depression -Started Celexa 2/16-increased to 20 mg 2/18  Ischemic cardiomyopathy -Echo this admission EF 40% -Continue hydralazine and Isordil -No ACE inhibitor secondary to underlying kidney disease -Will need follow-up echo 3 months post initial echo which was completed on 07/23/2020  Hypertension -Was on Norvasc prior to admission -Continue cardiovascular meds as listed  above  Acute on chronic kidney disease stage IIIb -Suspect prerenal etiology in the setting of hypoperfusion from ventricular fibrillation arrest -Baseline creatinine between 1.6 and 2; creatinine on 12/19 was 1.67-will repeat in a.m. on 08/19/2020 -Continue free water per tube and intermittently follow labs  Dysphagia, moderate protein calorie malnutrition Nutrition Problem: Inadequate oral intake Etiology: inability to eat Signs/Symptoms: NPO status  -Continue D2 diet noting patient with improved oral intake.  Will discontinue nocturnal tube feedings for now continue to follow -Add Ensure beverage twice daily between meals. Interventions: Tube feeding,Prostat Estimated body mass index is 25.18 kg/m as calculated from the following:   Height as of this encounter: 6' (1.829 m).   Weight as of this encounter: 84.2 kg. -3/2 have changed all per tube meds to oral. If patient able to maintain adequate oral intake of both medications food and fluids will consider removing PEG tube to allow patient to discharge to a nonskilled nursing facility if necessary    Other problems: Acute hypernatremia -Continue free water per tube  E. coli UTI -Has completed 5 days of Bactrim  Hyperglycemia without an underlying diagnosis of diabetes mellitus Resolved -Likely  related to acute stress -Recent issues with hypoglycemia so sliding scale insulin decreased to very sensitive and Levemir decreased from 30 twice daily to 15 twice daily as of 1/14 -Hemoglobin A1c 4.7   Acute hypoxemic respiratory failure -Resolved -Stable on room air with O2 sats between 97 and 99%  Nonsustained VT -Continue amiodarone and beta-blocker -1/31 LFTs normal  Skin tear right lateral back Wound / Incision (Open or Dehisced) 07/28/20 Skin tear Back Lateral;Right;Upper (Active)  Date First Assessed/Time First Assessed: 07/28/20 0300   Wound Type: Skin tear  Location: Back  Location Orientation: Lateral;Right;Upper   Present on Admission: No    Assessments 07/28/2020  7:49 PM 10/17/2020  8:00 PM  Dressing Type None None  Site / Wound Assessment - Clean;Dry  Peri-wound Assessment - Intact  Margins - Attached edges (approximated)  Closure - None  Drainage Amount None None  Drainage Description - No odor  Non-staged Wound Description - Not applicable  Treatment Cleansed -     No Linked orders to display    Data Reviewed: Basic Metabolic Panel: Recent Labs  Lab 10/14/20 0737  NA 137  K 4.3  CL 100  CO2 28  GLUCOSE 161*  BUN 16  CREATININE 1.15  CALCIUM 9.3   Liver Function Tests: No results for input(s): AST, ALT, ALKPHOS, BILITOT, PROT, ALBUMIN in the last 168 hours. No results for input(s): LIPASE, AMYLASE in the last 168 hours. No results for input(s): AMMONIA in the last 168 hours. CBC: Recent Labs  Lab 10/14/20 0737  WBC 9.9  HGB 12.4*  HCT 38.4*  MCV 95.5  PLT 200   Cardiac Enzymes: No results for input(s): CKTOTAL, CKMB, CKMBINDEX, TROPONINI in the last 168 hours. BNP (last 3 results) No results for input(s): BNP in the last 8760 hours.  ProBNP (last 3 results) No results for input(s): PROBNP in the last 8760 hours.  CBG: Recent Labs  Lab 10/17/20 0542 10/17/20 1202 10/17/20 1621 10/18/20 0021 10/18/20 0613  GLUCAP 117* 152* 107* 100* 116*    No results found for this or any previous visit (from the past 240 hour(s)).   Studies: No results found.  Scheduled Meds: . amantadine  100 mg Per Tube Daily  . aspirin  81 mg Per Tube Daily  . atorvastatin  80 mg Per Tube Daily  . carvedilol  25 mg Per Tube BID WC  . chlorhexidine gluconate (MEDLINE KIT)  15 mL Mouth Rinse BID  . citalopram  20 mg Per Tube Daily  . clopidogrel  75 mg Per Tube Daily  . COVID-19 mRNA vaccine (Moderna)  0.25 mL Intramuscular Once  . enoxaparin (LOVENOX) injection  40 mg Subcutaneous Q24H  . feeding supplement  237 mL Oral BID BM  . feeding supplement (PROSource TF)  45 mL Per  Tube BID  . fluconazole  100 mg Oral Daily  . free water  200 mL Per Tube Q2H  . Gerhardt's butt cream   Topical TID  . hydrALAZINE  25 mg Per Tube Q8H  . insulin aspart  0-15 Units Subcutaneous Q6H  . isosorbide dinitrate  10 mg Per Tube TID  . mouth rinse  15 mL Mouth Rinse BID  . polyvinyl alcohol  1 drop Both Eyes QID  . QUEtiapine  25 mg Per Tube Daily  . sodium chloride flush  3 mL Intravenous Q12H   Continuous Infusions:   Active Problems:   Acute ST elevation myocardial infarction (STEMI) [facility])   Cardiac arrest ([place])  Elevated blood pressure reading with diagnosis of hypertension   Shock circulatory ([facility])   Encephalopathy   History of ETT   Cardiomyopathy, ischemic   Anoxic brain damage ([place])   Dysphagia   DNR (do not resuscitate) discussion   Gait instability   E. coli UTI   Consultants:  Cardiology  Interventional radiology  PCCM  Neurology  Procedures:  Cardiac catheterization 12/4  Continuous EEG 12/4 and overnight EEG with video 12/5  Echocardiogram 12/5  EEG 12/9  Percutaneous PEG tube placement 12/30  Antibiotics: Anti-infectives (From admission, onward)   Start     Dose/Rate Route Frequency Ordered Stop   10/14/20 1000  fluconazole (DIFLUCAN) tablet 100 mg        100 mg Oral Daily 10/14/20 0848 10/19/20 0959   09/13/20 1000  sulfamethoxazole-trimethoprim (BACTRIM) 200-40 MG/5ML suspension 20 mL        20 mL Per Tube Every 12 hours 09/13/20 0734 09/19/20 0926   09/13/20 0745  cefTRIAXone (ROCEPHIN) 1 g in sodium chloride 0.9 % 100 mL IVPB  Status:  Discontinued        1 g 200 mL/hr over 30 Minutes Intravenous Every 24 hours 09/13/20 0658 09/13/20 0734   08/17/20 0000  ceFAZolin (ANCEF) IVPB 2g/100 mL premix        2 g 200 mL/hr over 30 Minutes Intravenous To Radiology 08/15/20 1346 08/17/20 1111   07/22/20 0830  cefTRIAXone (ROCEPHIN) 2 g in sodium chloride 0.9 % 100 mL IVPB        2 g 200 mL/hr over 30 Minutes Intravenous Every 24  hours 07/22/20 0720 07/26/20 0858       Time spent: 20 minutes    Erin Hearing ANP  [facility] 7 am - 330 pm/M-F for direct patient care and secure chat Please refer to Amion for contact information 88 days

## 2020-10-18 NOTE — Progress Notes (Signed)
Physical Therapy Treatment Patient Details Name: Antonio Navarro MRN: [id number] DOB: [birth year] Today's Date: 10/18/2020    History of Present Illness [name] with history of tobacco abuse, obesity, HTN who was admitted 07/22/20 post cardiac arrest with inferolateral STEMI. He was resuscitated in the field by EMS after being found to be in V fib. Approximately 30 minutes of CPR. He was intubated on arrival.  Pt with anoxic brain injury. PEG placed on 08/17/20.    PT Comments    Pt resistive initially, but once able to get him up on his feet, ambulating 320 feet with a walker at a min assist level. He is demonstrating mildly improved balance. Pt was unable to way find back to his room. Continues to need 24/7 assist in context of cognitive deficits and decreased ability to complete ADL's. In light of this and family being unable to provide, continue to recommend SNF for ongoing Physical Therapy.      Follow Up Recommendations  SNF;Supervision/Assistance - 24 hour     Equipment Recommendations  Rolling walker with 5" wheels;3in1 (PT);Wheelchair (measurements PT);Wheelchair cushion (measurements PT)    Recommendations for Other Services       Precautions / Restrictions Precautions Precautions: Fall Precaution Comments: g tube Restrictions Weight Bearing Restrictions: No    Mobility  Bed Mobility Overal bed mobility: Needs Assistance Bed Mobility: Supine to Sit     Supine to sit: Mod assist     General bed mobility comments: ModA to elevate trunk into sitting with pt able to maneuver BLEs to EOB    Transfers Overall transfer level: Needs assistance Equipment used: Rolling walker (2 wheeled) Transfers: Sit to/from Stand Sit to Stand: Mod assist         General transfer comment: ModA to rise to stand from edge of bed after multiple attempts. Pt resistive  Ambulation/Gait Ambulation/Gait assistance: Min assist Gait Distance (Feet): 320 Feet Assistive device:  Rolling walker (2 wheeled) Gait Pattern/deviations: Narrow base of support;Decreased stride length;Step-through pattern Gait velocity: decreased   General Gait Details: Cues for upward gaze, maintaining walker on straight path, minA for balance and walker negotiation   Stairs             Wheelchair Mobility    Modified Rankin (Stroke Patients Only)       Balance Overall balance assessment: Needs assistance Sitting-balance support: Feet supported Sitting balance-Leahy Scale: Good     Standing balance support: Bilateral upper extremity supported;During functional activity Standing balance-Leahy Scale: Poor Standing balance comment: reliant on BUE support this session                            Cognition Arousal/Alertness: Awake/alert Behavior During Therapy: Flat affect Overall Cognitive Status: Impaired/Different from baseline Area of Impairment: Attention;Following commands;Problem solving;Awareness;Safety/judgement;Memory                 Orientation Level: Disoriented to;Place;Time;Situation Current Attention Level: Selective Memory: Decreased short-term memory Following Commands: Follows multi-step commands inconsistently;Follows one step commands with increased time Safety/Judgement: Decreased awareness of safety Awareness: Intellectual Problem Solving: Slow processing;Difficulty sequencing;Decreased initiation;Requires verbal cues;Requires tactile cues        Exercises      General Comments        Pertinent Vitals/Pain Pain Assessment: Faces Faces Pain Scale: No hurt    Home Living  Prior Function            PT Goals (current goals can now be found in the care plan section) Acute Rehab PT Goals Patient Stated Goal: SO is hoping he can get stronger and do more for himself Potential to Achieve Goals: Fair    Frequency    Min 3X/week      PT Plan Current plan remains appropriate     Co-evaluation              AM-PAC PT "6 Clicks" Mobility   Outcome Measure  Help needed turning from your back to your side while in a flat bed without using bedrails?: None Help needed moving from lying on your back to sitting on the side of a flat bed without using bedrails?: A Lot Help needed moving to and from a bed to a chair (including a wheelchair)?: A Little Help needed standing up from a chair using your arms (e.g., wheelchair or bedside chair)?: A Lot Help needed to walk in hospital room?: A Little Help needed climbing 3-5 steps with a railing? : A Lot 6 Click Score: 16    End of Session Equipment Utilized During Treatment: Gait belt Activity Tolerance: Patient tolerated treatment well Patient left: in chair;with call bell/phone within reach;with chair alarm set Nurse Communication: Mobility status PT Visit Diagnosis: Other abnormalities of gait and mobility (R26.89);Difficulty in walking, not elsewhere classified (R26.2)     Time: 2956-2130 PT Time Calculation (min) (ACUTE ONLY): 23 min  Charges:  $Gait Training: 8-22 mins $Therapeutic Activity: 8-22 mins                     Wyona Almas, PT, DPT Acute Rehabilitation Services Pager [phone] Office [phone]    Deno Etienne 10/18/2020, 4:31 PM

## 2020-10-18 NOTE — Progress Notes (Signed)
  Speech Language Pathology Treatment: Dysphagia  Patient Details Name: Antonio Navarro MRN: [id number] DOB: [birth year] Today's Date: 10/18/2020 Time: 6381-7711 SLP Time Calculation (min) (ACUTE ONLY): 17 min  Assessment / Plan / Recommendation Clinical Impression  Pt was lethargic today but was agreeable to PO trials after multimodal cues to make pt more alert and arousable. One cough was noted following trial of thin liquid; however, pt was slouched over to the R. Once repositioned, no other overt s/s of aspiration were noted during limited PO trials. Pt still noted with prolonged mastication and benefited from liquid washes for potential oral holding. Pt was responsive to some yes/no questions by shaking his head especially when shown familiar pictures. Recommend continuation of DYS 2 diet with thin liquids and SLP will continue to follow up.      HPI HPI: Pt is a 60 yo male s/p arrest with downtime 9 minutes. Initial rhythm was V. fib, shocked and received 6 rounds of epi before ROSC achieved.  Total CPR time was 30 minutes. ETT 12/4-12/13. MRI 12/7 suggestive of a widespread anoxic injury. PMH includes kidney stones and HTN.      SLP Plan  Continue with current plan of care       Recommendations  Diet recommendations: Dysphagia 2 (fine chop);Thin liquid Liquids provided via: Cup;Straw Medication Administration: Crushed with puree Supervision: Staff to assist with self feeding;Full supervision/cueing for compensatory strategies Compensations: Slow rate;Small sips/bites;Minimize environmental distractions Postural Changes and/or Swallow Maneuvers: Seated upright 90 degrees                Oral Care Recommendations: Oral care BID Follow up Recommendations: Skilled Nursing facility SLP Visit Diagnosis: Dysphagia, unspecified (R13.10) Plan: Continue with current plan of care       GO                Jeanine Luz., SLP Student 10/18/2020, 10:04 AM

## 2020-10-18 NOTE — Progress Notes (Signed)
Nutrition Follow-up  DOCUMENTATION CODES:   Not applicable  INTERVENTION:   Recommend continuing tube feeding until patient shows progression in am meal. Once patient consuming 75-100% of each meal, can consider d/c of tube feeding.   Transition to bolus feedings:  -1 can/ARC of Jevity 1.5 TID between meals -Prosource TF 45 ml BID  Provides: 1145 kcals, 67 grams protein, 540 ml free water. Meets 50% of kcal needs and 58% protein needs.    Ensure Enlive po BID, each supplement provides 350 kcal and 20 grams of protein  Magic cup TID with meals, each supplement provides 290 kcal and 9 grams of protein  Assistance with meals   NUTRITION DIAGNOSIS:   Inadequate oral intake related to inability to eat as evidenced by NPO status.  Diet advanced   GOAL:   Patient will meet greater than or equal to 90% of their needs  Progressing   MONITOR:   Diet advancement,Labs,Weight trends,TF tolerance,Skin,I & O's  REASON FOR ASSESSMENT:   Consult,Ventilator Enteral/tube feeding initiation and management  ASSESSMENT:   Patient with PMH significant for HTN and kidney stones. Presents this admission s/p cardiac arrest.  12/13- NGT d/c 12/15- cortrak placed, tip of tube in the stomach 12/17- s/p BSE- recommend continue NPO 12/19- pt pulled cortrak tube, replaced- placement verified by x-ray (stomach) 12/20- s/p BSE- pt refusing PO trials 12/30- cortrak removed, PEG placed 1/5- Advanced to thin liquid diet 1/6- Advanced to full liquid diet 1/11- Advanced to dysphagia 2 diet 1/14- transitioned to nocturnal feedings by MD 1/18- TF d/c by MD 1/24- calorie cont completed- pt consuming about 50% of needs PO; nocturnal feedings resumed  Intake progressing. Last five meals documented as 0%, 80%, 100%, and 75%. Patient skipping breakfast due to feelings of fullness from nocturnal tube feeding. Ensure intake inconsistent per RN. Patient needs encouragement for all meals and supplements  from family member at bedside. Transition to bolus to promote am appetite. Once patient demonstrates completion of 75-100% of ALL meals, may consider d/c of tube feeding.   Admission weight: 97.7 kg  Current weight: 84.2 kg   UOP: 1700 ml x 24 hrs   Medications: SS novolog Labs: CBG 99-152  Diet Order:   Diet Order            DIET DYS 2 Room service appropriate? No; Fluid consistency: Thin  Diet effective now                 EDUCATION NEEDS:   Not appropriate for education at this time  Skin:  Skin Assessment: Skin Integrity Issues: Skin Integrity Issues:: Other (Comment) Other: MASD to buttocks, skin tear to rt upper back  Last BM:  2/26  Height:   Ht Readings from Last 1 Encounters:  09/03/20 6' (1.829 m)    Weight:   Wt Readings from Last 1 Encounters:  10/18/20 84.2 kg    Ideal Body Weight:  80.9 kg  BMI:  Body mass index is 25.18 kg/m.  Estimated Nutritional Needs:   Kcal:  2300-2500 kcal  Protein:  115-130 grams  Fluid:  >/= 2 L/day   Mariana Single RD, LDN Clinical Nutrition Pager listed in [place]

## 2020-10-19 LAB — GLUCOSE, CAPILLARY
Glucose-Capillary: 114 mg/dL — ABNORMAL HIGH (ref 70–99)
Glucose-Capillary: 137 mg/dL — ABNORMAL HIGH (ref 70–99)
Glucose-Capillary: 155 mg/dL — ABNORMAL HIGH (ref 70–99)
Glucose-Capillary: 220 mg/dL — ABNORMAL HIGH (ref 70–99)
Glucose-Capillary: 62 mg/dL — ABNORMAL LOW (ref 70–99)

## 2020-10-19 MED ORDER — COVID-19 MRNA VAC-TRIS(PFIZER) 30 MCG/0.3ML IM SUSP
0.3000 mL | Freq: Once | INTRAMUSCULAR | Status: AC
  Administered 2020-10-19: 0.3 mL via INTRAMUSCULAR
  Filled 2020-10-19: qty 0.3

## 2020-10-19 NOTE — Progress Notes (Signed)
Pt was able to eat a little more than 50% of his lunch this shift with assistance from [place], family member.

## 2020-10-19 NOTE — Progress Notes (Addendum)
[facility] PROGRESS NOTE  Antonio Navarro GHW:[id number] DOB: 11/21/1960 DOA: 07/22/2020 PCP: Patient, No Pcp Per       Status: Remains inpatient appropriate because:Altered mental status and Unsafe d/c plan   Dispo: The patient is from: Home              Anticipated d/c is to: SNF              Anticipated d/c date is: > 3 days              Patient currently is medically stable to d/c.  Barriers to discharge: Medicaid and disability pending. No SNF bed offers.  No further nocturnal agitation or wandering behavior so hopeful will be eligible for SNF bed offer.  Patient able to ambulate 300 feet so hopefully he can eventually discharge home with family as long as they can provide appropriate 24/7 monitoring.  On 2/28 TOC contacted at [facility]   Code Status: Full Family Communication: Updated Sister [name] (who works as a Recruitment consultant from 6 AM to 11 AM).  She is aware that major restriction regarding placement has to do with Medicaid application still pending and many facilities are reluctant to accept a long-term patient without Medicaid in place. DVT prophylaxis: sq heparin Vaccination status: Completed both doses of Pfizer Covid vaccination on 12/14/2019; Percival booster ordered to be given on 3/3 Foley catheter: No  HPI: 60 year old male history of hypertension prescribed Norvasc prior to admission, chronic back pain, prior renal calculi who experienced an out of hospital cardiac arrest with downtime of 9 minutes.  Initial rhythm was ventricular fibrillation.  He was defibrillated deceived 6 doses of epi before ROSC.  Subsequent work-up revealed STEMI.  He underwent cardiac catheterization with a DES placed to the circumflex.  Cardiology following and has placed patient on Brilinta and aspirin and is also being treated with beta-blocker and statins.  From a neurological standpoint he clinically has anoxic brain injury.  During this hospitalization he experienced  AKI and was treated with fluids and correction of hypoperfusion from cardiac arrest.  Echo performed this admission revealed ischemic cardiomyopathy with an EF of 40%  In addition to above patient has been unable to pass swallowing evaluations and subsequently has undergone percutaneous PEG tube placement on 12/30.  Patient is awake and moving all extremities spontaneously but does not follow commands and does not verbalize palliative care has met with patient and family and has emphasized poor prognosis and will continue to meet with the family during the hospitalization.  Current plan is to transition to SNF bed once available.  Expect need for lifelong 24/7 care for all ADLs.  Subjective: Awake and sleepy which is not unusual for this patient first thing in the morning.  Did not want to take medications by mouth so nurse giving per tube.  Also did not wish to take Ensure by mouth.  He did not want to eat his breakfast but nurse was informed this is not unusual for patient.  Discussed with nurse need to have staff document in progress notes exactly how much patient is eating determine if we need to resume nocturnal tube feedings.  Objective: Vitals:   10/18/20 2234 10/19/20 0519  BP: (!) 133/91 (!) 133/91  Pulse: 82 79  Resp: 16 12  Temp: 98 F (36.7 C) 99.2 F (37.3 C)  SpO2: 99% 100%    Intake/Output Summary (Last 24 hours) at 10/19/2020 7893 Last data filed at 10/19/2020 0300 Gross  per 24 hour  Intake 837 ml  Output 250 ml  Net 587 ml   Filed Weights   10/16/20 0249 10/17/20 0355 10/18/20 0110  Weight: 86.7 kg 84.6 kg 84.2 kg    Exam: Constitutional: Awake but sleepy.  Opens eyes and does communicate verbally and nodding to questions asked Heart: Heart sounds are normal, no peripheral edema, pulse regular, skin warm and dry. Respiratory: Lungs are clear to auscultation and he is stable on room air Abdomen:  D2 diet with variable intake.  Tube feedings have been stopped for  now.-PEG tube site unremarkable.  LBM 3/2 Neurologic: CN 2-12 roughly without any appreciable focal neurological deficits, MOE x4, strength is 5/5.   Psychiatric: Awake, oriented times name only.  Minimal verbal response some days are better than others. Skin: Multiple flat dry areas on back w/ a more confluent area mid back that is covered by a dressing   Assessment/Plan: Acute problems: Out of hospital cardiac arrest secondary to STEMI -ROSC and subsequent cardiac catheterization revealed dz in circumflex requiring DES -Continue beta-blocker, statin, Plavix, isosorbide and hydralazine  Significant anoxic brain injury secondary to cardiac arrest/deconditioning/gait instability -Consistently participating with PT and OT.  SLP has cleared for dysphagia 2 with thin liquids -Declined by CIR since family unable to provide 24/7 care.  Plan is for SNF -Continue Seroquel 100 mg at at bedtime -2/18 add Seroquel 25 mg every 12 hours as needed for agitation and wandering -Continue daytime Symmetrel  **PT NOTES FROM 2/21**  Pt progressing steadily towards his physical therapy goals. Pt with increased verbalizations; requiring repetition at times to follow and maintain attention or following commands during tasks. Pt requiring min assist for functional mobility. Ambulating x 300 feet with a walker. Pt continues with weakness, poor standing balance, cognitive deficits. Continue to recommend SNF for ongoing Physical Therapy.     White lesions/rash on back and buttocks -Appeared after patient had laying in tube feeding    10/10/20                           10/10/20   10/16/2020 -Wounds have spread to mid back and more confluent therefore wound care nurse consulted with recommendations made.  Dressing placed over mid back wounds.  Situational depression -Started Celexa 2/16-increased to 20 mg 2/18  Ischemic cardiomyopathy -Echo this admission EF 40% -Continue hydralazine and Isordil -No ACE  inhibitor secondary to underlying kidney disease -Will need follow-up echo 3 months post initial echo which was completed on 07/23/2020  Hypertension -Was on Norvasc prior to admission -Continue cardiovascular meds as listed above  Acute on chronic kidney disease stage IIIb -Suspect prerenal etiology in the setting of hypoperfusion from ventricular fibrillation arrest -Baseline creatinine between 1.6 and 2; creatinine on 12/19 was 1.67-will repeat in a.m. on 08/19/2020 -Continue free water per tube and intermittently follow labs  Dysphagia, moderate protein calorie malnutrition Nutrition Problem: Inadequate oral intake Etiology: inability to eat Signs/Symptoms: NPO status  -Continue D2 diet noting patient with improved oral intake.  Will discontinue nocturnal tube feedings for now continue to follow -Add Ensure beverage twice daily between meals. Interventions: Tube feeding,Prostat Estimated body mass index is 25.18 kg/m as calculated from the following:   Height as of this encounter: 6' (1.829 m).   Weight as of this encounter: 84.2 kg. -3/2 have changed all per tube meds to oral. If patient able to maintain adequate oral intake of both medications food and fluids will  consider removing PEG tube to allow patient to discharge to a nonskilled nursing facility if necessary    Other problems: Acute hypernatremia -Continue free water per tube  E. coli UTI -Has completed 5 days of Bactrim  Hyperglycemia without an underlying diagnosis of diabetes mellitus Resolved -Likely related to acute stress -Recent issues with hypoglycemia so sliding scale insulin decreased to very sensitive and Levemir decreased from 30 twice daily to 15 twice daily as of 1/14 -Hemoglobin A1c 4.7   Acute hypoxemic respiratory failure -Resolved -Stable on room air with O2 sats between 97 and 99%  Nonsustained VT -Continue amiodarone and beta-blocker -1/31 LFTs normal  Skin tear right lateral back Wound /  Incision (Open or Dehisced) 07/28/20 Skin tear Back Lateral;Right;Upper (Active)  Date First Assessed/Time First Assessed: 07/28/20 0300   Wound Type: Skin tear  Location: Back  Location Orientation: Lateral;Right;Upper  Present on Admission: No    Assessments 07/28/2020  7:49 PM 10/17/2020  8:00 PM  Dressing Type None None  Site / Wound Assessment -- Clean;Dry  Peri-wound Assessment -- Intact  Margins -- Attached edges (approximated)  Closure -- None  Drainage Amount None None  Drainage Description -- No odor  Non-staged Wound Description -- Not applicable  Treatment Cleansed --     No Linked orders to display    Data Reviewed: Basic Metabolic Panel: Recent Labs  Lab 10/14/20 0737  NA 137  K 4.3  CL 100  CO2 28  GLUCOSE 161*  BUN 16  CREATININE 1.15  CALCIUM 9.3   Liver Function Tests: No results for input(s): AST, ALT, ALKPHOS, BILITOT, PROT, ALBUMIN in the last 168 hours. No results for input(s): LIPASE, AMYLASE in the last 168 hours. No results for input(s): AMMONIA in the last 168 hours. CBC: Recent Labs  Lab 10/14/20 0737  WBC 9.9  HGB 12.4*  HCT 38.4*  MCV 95.5  PLT 200   Cardiac Enzymes: No results for input(s): CKTOTAL, CKMB, CKMBINDEX, TROPONINI in the last 168 hours. BNP (last 3 results) No results for input(s): BNP in the last 8760 hours.  ProBNP (last 3 results) No results for input(s): PROBNP in the last 8760 hours.  CBG: Recent Labs  Lab 10/18/20 0613 10/18/20 1146 10/18/20 1614 10/19/20 0012 10/19/20 0616  GLUCAP 116* 133* 115* 155* 114*    No results found for this or any previous visit (from the past 240 hour(s)).   Studies: No results found.  Scheduled Meds: . amantadine  100 mg Oral Daily  . aspirin  81 mg Oral Daily  . atorvastatin  80 mg Oral Daily  . carvedilol  25 mg Oral BID WC  . chlorhexidine gluconate (MEDLINE KIT)  15 mL Mouth Rinse BID  . citalopram  20 mg Oral Daily  . clopidogrel  75 mg Oral Daily  . COVID-19  mRNA vaccine (Moderna)  0.25 mL Intramuscular Once  . enoxaparin (LOVENOX) injection  40 mg Subcutaneous Q24H  . feeding supplement  237 mL Oral BID BM  . feeding supplement (JEVITY 1.5 CAL/FIBER)  237 mL Per Tube TID BM  . feeding supplement (PROSource TF)  45 mL Per Tube BID  . Gerhardt's butt cream   Topical TID  . hydrALAZINE  25 mg Oral Q8H  . insulin aspart  0-15 Units Subcutaneous Q6H  . isosorbide dinitrate  10 mg Oral TID  . mouth rinse  15 mL Mouth Rinse BID  . polyvinyl alcohol  1 drop Both Eyes QID  . QUEtiapine  25 mg  Oral Daily  . sodium chloride flush  3 mL Intravenous Q12H   Continuous Infusions:   Active Problems:   Acute ST elevation myocardial infarction (STEMI) [facility])   Cardiac arrest (HCC)   Elevated blood pressure reading with diagnosis of hypertension   Shock circulatory (HCC)   Encephalopathy   History of ETT   Cardiomyopathy, ischemic   Anoxic brain damage ([place])   Dysphagia   DNR (do not resuscitate) discussion   Gait instability   E. coli UTI   Consultants:  Cardiology  Interventional radiology  PCCM  Neurology  Procedures:  Cardiac catheterization 12/4  Continuous EEG 12/4 and overnight EEG with video 12/5  Echocardiogram 12/5  EEG 12/9  Percutaneous PEG tube placement 12/30  Antibiotics: Anti-infectives (From admission, onward)   Start     Dose/Rate Route Frequency Ordered Stop   10/14/20 1000  fluconazole (DIFLUCAN) tablet 100 mg        100 mg Oral Daily 10/14/20 0848 10/18/20 0932   09/13/20 1000  sulfamethoxazole-trimethoprim (BACTRIM) 200-40 MG/5ML suspension 20 mL        20 mL Per Tube Every 12 hours 09/13/20 0734 09/19/20 0926   09/13/20 0745  cefTRIAXone (ROCEPHIN) 1 g in sodium chloride 0.9 % 100 mL IVPB  Status:  Discontinued        1 g 200 mL/hr over 30 Minutes Intravenous Every 24 hours 09/13/20 0658 09/13/20 0734   08/17/20 0000  ceFAZolin (ANCEF) IVPB 2g/100 mL premix        2 g 200 mL/hr over 30 Minutes  Intravenous To Radiology 08/15/20 1346 08/17/20 1111   07/22/20 0830  cefTRIAXone (ROCEPHIN) 2 g in sodium chloride 0.9 % 100 mL IVPB        2 g 200 mL/hr over 30 Minutes Intravenous Every 24 hours 07/22/20 0720 07/26/20 0858       Time spent: 20 minutes    Erin Hearing ANP  [facility] 7 am - 330 pm/M-F for direct patient care and secure chat Please refer to Amion for contact information 89 days

## 2020-10-20 ENCOUNTER — Inpatient Hospital Stay (HOSPITAL_COMMUNITY): Payer: Medicaid Other

## 2020-10-20 DIAGNOSIS — I255 Ischemic cardiomyopathy: Secondary | ICD-10-CM

## 2020-10-20 LAB — ECHOCARDIOGRAM COMPLETE
AR max vel: 2.65 cm2
AV Area VTI: 2.36 cm2
AV Area mean vel: 2.24 cm2
AV Mean grad: 2 mmHg
AV Peak grad: 3.4 mmHg
Ao pk vel: 0.92 m/s
Area-P 1/2: 2.11 cm2
Calc EF: 43.1 %
Height: 72 in
S' Lateral: 3.1 cm
Single Plane A2C EF: 47.3 %
Single Plane A4C EF: 41.7 %
Weight: 3012.37 oz

## 2020-10-20 LAB — GLUCOSE, CAPILLARY
Glucose-Capillary: 101 mg/dL — ABNORMAL HIGH (ref 70–99)
Glucose-Capillary: 149 mg/dL — ABNORMAL HIGH (ref 70–99)
Glucose-Capillary: 165 mg/dL — ABNORMAL HIGH (ref 70–99)
Glucose-Capillary: 192 mg/dL — ABNORMAL HIGH (ref 70–99)

## 2020-10-20 MED ORDER — JEVITY 1.2 CAL PO LIQD
237.0000 mL | Freq: Four times a day (QID) | ORAL | Status: DC
  Administered 2020-10-20 – 2020-10-26 (×21): 237 mL
  Filled 2020-10-20 (×26): qty 237

## 2020-10-20 MED ORDER — JEVITY 1.5 CAL/FIBER PO LIQD
237.0000 mL | Freq: Three times a day (TID) | ORAL | Status: DC
  Filled 2020-10-20 (×2): qty 237

## 2020-10-20 NOTE — Progress Notes (Signed)
Nutrition Follow-up  DOCUMENTATION CODES:   Not applicable  INTERVENTION:   -Initiate 72 hour calorie count -Continue Ensure Enlive po BID, each supplement provides 350 kcal and 20 grams of protein -Continue Magic cup TID with meals, each supplement provides 290 kcal and 9 grams of protein -D/c Jevity 1.5 -D/c Prosource TF -Bolus feedings:   1 ARC carton (237 ml) Jevity 1.2 QID   Tube feeding regimen provides 1140 kcal (50% of needs), 53 grams of protein, and 764 ml of H2O.   NUTRITION DIAGNOSIS:   Inadequate oral intake related to inability to eat as evidenced by NPO status.  Ongoing; advanced to a dysphagia 2 diet on 08/29/20  GOAL:   Patient will meet greater than or equal to 90% of their needs  Progressing   MONITOR:   Diet advancement,Labs,Weight trends,TF tolerance,Skin,I & O's  REASON FOR ASSESSMENT:   Consult,Ventilator Enteral/tube feeding initiation and management  ASSESSMENT:   Patient with PMH significant for HTN and kidney stones. Presents this admission s/p cardiac arrest.  12/13- NGT d/c 12/15- cortrak placed, tip of tube in the stomach 12/17- s/p BSE- recommend continue NPO 12/19- pt pulled cortrak tube, replaced- placement verified by x-ray (stomach) 12/20- s/p BSE- pt refusing PO trials 12/30- cortrak removed, PEG placed 1/5- Advanced to thin liquid diet 1/6- Advanced to full liquid diet 1/11- Advanced to dysphagia 2 diet 1/14- transitioned to nocturnal feedings by MD 1/18- TF d/c by MD 1/24- calorie cont completed- pt consuming about 50% of needs PO; nocturnal feedings resumed  Reviewed I/O's: -243 ml x 24 hours and +932 ml since 10/06/20  UOP: 1.3 L x 24 hours  Pt intake continues to improve, however, still usually skipping breakfast. He usually consumes 60-100% of his meal at lunch and dinner.   Case discussed with pharmacist; recommending changing Jevity 1.5 to Jevity 1.2 due to stock availability.   Wt has been stable over the  past month.   Per chart review, pt continues to await SNF placement. He is medically stable for discharge.  Labs reviewed: CBGS: 62-220 (inpatient orders for glycemic control are 0-15 units insulin aspart every 6 hours).   Diet Order:   Diet Order            DIET DYS 2 Room service appropriate? No; Fluid consistency: Thin  Diet effective now                 EDUCATION NEEDS:   Not appropriate for education at this time  Skin:  Skin Assessment: Skin Integrity Issues: Skin Integrity Issues:: Other (Comment) Other: MASD to buttocks, skin tear to rt upper back  Last BM:  2/26  Height:   Ht Readings from Last 1 Encounters:  09/03/20 6' (1.829 m)    Weight:   Wt Readings from Last 1 Encounters:  10/20/20 85.4 kg    Ideal Body Weight:  80.9 kg  BMI:  Body mass index is 25.53 kg/m.  Estimated Nutritional Needs:   Kcal:  2300-2500 kcal  Protein:  115-130 grams  Fluid:  >/= 2 L/day    Loistine Chance, RD, LDN, [place] Registered Dietitian II Certified Diabetes Care and Education Specialist Please refer to [facility] for RD and/or RD on-call/weekend/after hours pager

## 2020-10-20 NOTE — Progress Notes (Signed)
Physical Therapy Treatment Patient Details Name: Antonio Navarro MRN: [id number] DOB: [birth year] Today's Date: 10/20/2020    History of Present Illness [name] with history of tobacco abuse, obesity, HTN who was admitted 07/22/20 post cardiac arrest with inferolateral STEMI. He was resuscitated in the field by EMS after being found to be in V fib. Approximately 30 minutes of CPR. He was intubated on arrival.  Pt with anoxic brain injury. PEG placed on 08/17/20.    PT Comments    Pt resting in recliner upon PT arrival to room, pt's fiance at bedside encouraging pt to ambulate in hallway. Pt overall requiring min assist this day for steadying, guiding RW, and multimodal cuing for form and safety during gait. Pt very motivated by fiance during gait. PT to continue to follow acutely.    Follow Up Recommendations  SNF;Supervision/Assistance - 24 hour     Equipment Recommendations  Rolling walker with 5" wheels;3in1 (PT);Wheelchair (measurements PT);Wheelchair cushion (measurements PT)    Recommendations for Other Services       Precautions / Restrictions Precautions Precautions: Fall Precaution Comments: g tube Restrictions Weight Bearing Restrictions: No    Mobility  Bed Mobility   Bed Mobility: Supine to Sit     Supine to sit: Min assist;Mod assist     General bed mobility comments: up in chair upon PT arrival to room    Transfers Overall transfer level: Needs assistance Equipment used: Rolling walker (2 wheeled) Transfers: Sit to/from Stand Sit to Stand: Min assist Stand pivot transfers: Min assist       General transfer comment: Min assist for power up, rise, and steadying upon standing. VC for hand placement when rising.  Ambulation/Gait Ambulation/Gait assistance: Min assist Gait Distance (Feet): 180 Feet Assistive device: Rolling walker (2 wheeled) Gait Pattern/deviations: Narrow base of support;Decreased stride length;Step-through pattern;Trunk  flexed;Staggering right Gait velocity: decr   General Gait Details: Min assist to steady and guide RW, verbal cuing for upright posture, looking forward as opposed to down, widening BOS. Pt's RW listing R and difficult for pt to correct without PT assist   Stairs             Wheelchair Mobility    Modified Rankin (Stroke Patients Only)       Balance Overall balance assessment: Needs assistance Sitting-balance support: Feet supported Sitting balance-Leahy Scale: Good Sitting balance - Comments: patient tends to lean forward and place elbows on knees     Standing balance-Leahy Scale: Poor Standing balance comment: reliant on UE support this session and cueing for direction                            Cognition Arousal/Alertness: Awake/alert Behavior During Therapy: Flat affect Overall Cognitive Status: Impaired/Different from baseline Area of Impairment: Attention;Following commands;Problem solving;Awareness;Safety/judgement;Memory;Orientation                 Orientation Level: Disoriented to;Place;Time;Situation Current Attention Level: Sustained Memory: Decreased short-term memory Following Commands: Follows multi-step commands inconsistently;Follows one step commands with increased time Safety/Judgement: Decreased awareness of safety Awareness: Intellectual Problem Solving: Slow processing;Difficulty sequencing;Decreased initiation;Requires verbal cues;Requires tactile cues        Exercises      General Comments General comments (skin integrity, edema, etc.): pt's fiance present during session      Pertinent Vitals/Pain Pain Assessment: Faces Faces Pain Scale: Hurts a little bit Pain Location: generalized, during sit>stand Pain Descriptors / Indicators: Discomfort;Grimacing Pain Intervention(s):  Monitored during session    Home Living                      Prior Function            PT Goals (current goals can now be found  in the care plan section) Acute Rehab PT Goals Patient Stated Goal: SO is hoping he can get stronger and do more for himself PT Goal Formulation: With patient Time For Goal Achievement: 10/23/20 Potential to Achieve Goals: Fair Progress towards PT goals: Progressing toward goals    Frequency    Min 3X/week      PT Plan Current plan remains appropriate    Co-evaluation              AM-PAC PT "6 Clicks" Mobility   Outcome Measure  Help needed turning from your back to your side while in a flat bed without using bedrails?: None Help needed moving from lying on your back to sitting on the side of a flat bed without using bedrails?: A Lot Help needed moving to and from a bed to a chair (including a wheelchair)?: A Little Help needed standing up from a chair using your arms (e.g., wheelchair or bedside chair)?: A Little Help needed to walk in hospital room?: A Little Help needed climbing 3-5 steps with a railing? : A Lot 6 Click Score: 17    End of Session Equipment Utilized During Treatment: Gait belt Activity Tolerance: Patient tolerated treatment well Patient left: in chair;with call bell/phone within reach;with chair alarm set;with family/visitor present Nurse Communication: Mobility status PT Visit Diagnosis: Other abnormalities of gait and mobility (R26.89);Difficulty in walking, not elsewhere classified (R26.2)     Time: 1420-1440 PT Time Calculation (min) (ACUTE ONLY): 20 min  Charges:  $Gait Training: 8-22 mins                     Stacie Glaze, PT Acute Rehabilitation Services Pager [phone]  Office [phone]    [place] 10/20/2020, 3:55 PM

## 2020-10-20 NOTE — Progress Notes (Signed)
[facility] PROGRESS NOTE  Antonio Navarro VVK:[id number] DOB: Jul 13, 1961 DOA: 07/22/2020 PCP: Patient, No Pcp Per       Status: Remains inpatient appropriate because:Altered mental status and Unsafe d/c plan   Dispo: The patient is from: Home              Anticipated d/c is to: SNF              Anticipated d/c date is: > 3 days              Patient currently is medically stable to d/c.  Barriers to discharge: Medicaid and disability pending. No SNF bed offers.  No further nocturnal agitation or wandering behavior so hopeful will be eligible for SNF bed offer.  Patient able to ambulate 300 feet so hopefully he can eventually discharge home with family as long as they can provide appropriate 24/7 monitoring.  On 2/28 TOC contacted at [facility]   Code Status: Full Family Communication: Updated Sister [name] (who works as a Recruitment consultant from 6 AM to 11 AM).  She is aware that major restriction regarding placement has to do with Medicaid application still pending and many facilities are reluctant to accept a long-term patient without Medicaid in place. DVT prophylaxis: sq heparin Vaccination status: Completed both doses of Pfizer Covid vaccination on 12/14/2019; [place] booster ordered to be given on 3/3 Foley catheter: No  HPI: 60 year old male history of hypertension prescribed Norvasc prior to admission, chronic back pain, prior renal calculi who experienced an out of hospital cardiac arrest with downtime of 9 minutes.  Initial rhythm was ventricular fibrillation.  He was defibrillated deceived 6 doses of epi before ROSC.  Subsequent work-up revealed STEMI.  He underwent cardiac catheterization with a DES placed to the circumflex.  Cardiology following and has placed patient on Brilinta and aspirin and is also being treated with beta-blocker and statins.  From a neurological standpoint he clinically has anoxic brain injury.  During this hospitalization he experienced  AKI and was treated with fluids and correction of hypoperfusion from cardiac arrest.  Echo performed this admission revealed ischemic cardiomyopathy with an EF of 40%  In addition to above patient has been unable to pass swallowing evaluations and subsequently has undergone percutaneous PEG tube placement on 12/30.  Patient is awake and moving all extremities spontaneously but does not follow commands and does not verbalize palliative care has met with patient and family and has emphasized poor prognosis and will continue to meet with the family during the hospitalization.  Current plan is to transition to SNF bed once available.  Expect need for lifelong 24/7 care for all ADLs.  Subjective: Awake.  As per usual he is very sleepy in the morning but will smile appropriately when interacted with and nods appropriately to questions asked.  Denies pain regarding lesions on his back.  Objective: Vitals:   10/19/20 2031 10/20/20 0608  BP: 107/81 (!) 146/85  Pulse: 85 83  Resp: 19 18  Temp: 98.1 F (36.7 C) 97.8 F (36.6 C)  SpO2: 99% 94%    Intake/Output Summary (Last 24 hours) at 10/20/2020 0744 Last data filed at 10/20/2020 0500 Gross per 24 hour  Intake 1007 ml  Output 1250 ml  Net -243 ml   Filed Weights   10/18/20 0110 10/20/20 0042 10/20/20 0500  Weight: 84.2 kg 85.4 kg 85.4 kg    Exam: Constitutional: Awake, calm, no acute distress Heart: Normal heart sounds, pulses regular, normotensive, extremities  warm to touch Respiratory: Posterior lung sounds are clear to auscultation and he remained stable on room air Abdomen:  D2 diet with variable intake noting very poor intake in the morning but eats between 50 and 100% of meals at lunch and supper and does better when assisted with meals.Marland Kitchen LBM 3/2 Neurologic: CN 2-12 roughly without any appreciable focal neurological deficits, MOE x4, strength is 5/5.   Psychiatric: Calm.  Oriented to name only. Skin: Multiple flat dry areas on back w/ a  more confluent area mid back that is covered by a dressing   Assessment/Plan: Acute problems: Out of hospital cardiac arrest secondary to STEMI -ROSC and subsequent cardiac catheterization revealed dz in circumflex requiring DES -Continue beta-blocker, statin, Plavix, isosorbide and hydralazine  Significant anoxic brain injury secondary to cardiac arrest/deconditioning/gait instability -Consistently participating with PT and OT.  SLP has cleared for dysphagia 2 with thin liquids -Declined by CIR since family unable to provide 24/7 care.  Plan is for SNF -Continue Seroquel 100 mg at at bedtime -2/18 add Seroquel 25 mg every 12 hours as needed for agitation and wandering -Continue daytime Symmetrel  **PT NOTES FROM 2/21**  Pt progressing steadily towards his physical therapy goals. Pt with increased verbalizations; requiring repetition at times to follow and maintain attention or following commands during tasks. Pt requiring min assist for functional mobility. Ambulating x 300 feet with a walker. Pt continues with weakness, poor standing balance, cognitive deficits. Continue to recommend SNF for ongoing Physical Therapy.     White lesions/rash on back and buttocks -Appeared after patient had laying in tube feeding    10/10/20                           10/10/20     10/16/2020     10/20/2020                                     10/20/2020 -As of 3/4 wounds not covered by dressing are drying up and are clearing.  Have opted to remove wounds covered by dressing to allow to dry up and clear as well.  Situational depression -Started Celexa 2/16-increased to 20 mg 2/18  Ischemic cardiomyopathy -Echo this admission EF 40% -Continue hydralazine and Isordil -No ACE inhibitor secondary to underlying kidney disease -Will need follow-up echo 3 months post initial echo which was completed on 07/23/2020-will obtain ECHO on 3/4  Hypertension -Was on Norvasc prior to admission -Continue cardiovascular  meds as listed above  Acute on chronic kidney disease stage IIIb -Suspect prerenal etiology in the setting of hypoperfusion from ventricular fibrillation arrest -Baseline creatinine between 1.6 and 2; creatinine on 12/19 was 1.67-will repeat in a.m. on 08/19/2020 -Continue free water per tube and intermittently follow labs  Dysphagia, moderate protein calorie malnutrition Nutrition Problem: Inadequate oral intake Etiology: inability to eat Signs/Symptoms: NPO status  -Continue D2 diet noting patient with improved oral intake.  Will discontinue nocturnal tube feedings for now continue to follow -Add Ensure beverage twice daily between meals. Interventions: Tube feeding,Prostat Estimated body mass index is 25.53 kg/m as calculated from the following:   Height as of this encounter: 6' (1.829 m).   Weight as of this encounter: 85.4 kg. -3/2 have changed all per tube meds to oral. If patient able to maintain adequate oral intake of both medications food and fluids will consider removing PEG  tube to allow patient to discharge to a nonskilled nursing facility if necessary    Other problems: Acute hypernatremia -Continue free water per tube  E. coli UTI -Has completed 5 days of Bactrim  Hyperglycemia without an underlying diagnosis of diabetes mellitus Resolved -Likely related to acute stress -Recent issues with hypoglycemia so sliding scale insulin decreased to very sensitive and Levemir decreased from 30 twice daily to 15 twice daily as of 1/14 -Hemoglobin A1c 4.7   Acute hypoxemic respiratory failure -Resolved -Stable on room air with O2 sats between 97 and 99%  Nonsustained VT -Continue amiodarone and beta-blocker -1/31 LFTs normal  Skin tear right lateral back Wound / Incision (Open or Dehisced) 07/28/20 Skin tear Back Lateral;Right;Upper (Active)  Date First Assessed/Time First Assessed: 07/28/20 0300   Wound Type: Skin tear  Location: Back  Location Orientation:  Lateral;Right;Upper  Present on Admission: No    Assessments 07/28/2020  7:49 PM 10/17/2020  8:00 PM  Dressing Type None None  Site / Wound Assessment - Clean;Dry  Peri-wound Assessment - Intact  Margins - Attached edges (approximated)  Closure - None  Drainage Amount None None  Drainage Description - No odor  Non-staged Wound Description - Not applicable  Treatment Cleansed -     No Linked orders to display    Data Reviewed: Basic Metabolic Panel: Recent Labs  Lab 10/14/20 0737  NA 137  K 4.3  CL 100  CO2 28  GLUCOSE 161*  BUN 16  CREATININE 1.15  CALCIUM 9.3   Liver Function Tests: No results for input(s): AST, ALT, ALKPHOS, BILITOT, PROT, ALBUMIN in the last 168 hours. No results for input(s): LIPASE, AMYLASE in the last 168 hours. No results for input(s): AMMONIA in the last 168 hours. CBC: Recent Labs  Lab 10/14/20 0737  WBC 9.9  HGB 12.4*  HCT 38.4*  MCV 95.5  PLT 200   Cardiac Enzymes: No results for input(s): CKTOTAL, CKMB, CKMBINDEX, TROPONINI in the last 168 hours. BNP (last 3 results) No results for input(s): BNP in the last 8760 hours.  ProBNP (last 3 results) No results for input(s): PROBNP in the last 8760 hours.  CBG: Recent Labs  Lab 10/19/20 0616 10/19/20 1101 10/19/20 1611 10/19/20 2340 10/20/20 0609  GLUCAP 114* 220* 62* 137* 101*    No results found for this or any previous visit (from the past 240 hour(s)).   Studies: No results found.  Scheduled Meds: . amantadine  100 mg Oral Daily  . aspirin  81 mg Oral Daily  . atorvastatin  80 mg Oral Daily  . carvedilol  25 mg Oral BID WC  . chlorhexidine gluconate (MEDLINE KIT)  15 mL Mouth Rinse BID  . citalopram  20 mg Oral Daily  . clopidogrel  75 mg Oral Daily  . enoxaparin (LOVENOX) injection  40 mg Subcutaneous Q24H  . feeding supplement  237 mL Oral BID BM  . feeding supplement (JEVITY 1.5 CAL/FIBER)  237 mL Per Tube TID BM  . feeding supplement (PROSource TF)  45 mL Per  Tube BID  . Gerhardt's butt cream   Topical TID  . hydrALAZINE  25 mg Oral Q8H  . insulin aspart  0-15 Units Subcutaneous Q6H  . isosorbide dinitrate  10 mg Oral TID  . mouth rinse  15 mL Mouth Rinse BID  . polyvinyl alcohol  1 drop Both Eyes QID  . QUEtiapine  25 mg Oral Daily  . sodium chloride flush  3 mL Intravenous Q12H  Continuous Infusions:   Active Problems:   Acute ST elevation myocardial infarction (STEMI) [facility])   Cardiac arrest (HCC)   Elevated blood pressure reading with diagnosis of hypertension   Shock circulatory (HCC)   Encephalopathy   History of ETT   Cardiomyopathy, ischemic   Anoxic brain damage ([place])   Dysphagia   DNR (do not resuscitate) discussion   Gait instability   E. coli UTI   Consultants:  Cardiology  Interventional radiology  PCCM  Neurology  Procedures:  Cardiac catheterization 12/4  Continuous EEG 12/4 and overnight EEG with video 12/5  Echocardiogram 12/5  EEG 12/9  Percutaneous PEG tube placement 12/30  Antibiotics: Anti-infectives (From admission, onward)   Start     Dose/Rate Route Frequency Ordered Stop   10/14/20 1000  fluconazole (DIFLUCAN) tablet 100 mg        100 mg Oral Daily 10/14/20 0848 10/18/20 0932   09/13/20 1000  sulfamethoxazole-trimethoprim (BACTRIM) 200-40 MG/5ML suspension 20 mL        20 mL Per Tube Every 12 hours 09/13/20 0734 09/19/20 0926   09/13/20 0745  cefTRIAXone (ROCEPHIN) 1 g in sodium chloride 0.9 % 100 mL IVPB  Status:  Discontinued        1 g 200 mL/hr over 30 Minutes Intravenous Every 24 hours 09/13/20 0658 09/13/20 0734   08/17/20 0000  ceFAZolin (ANCEF) IVPB 2g/100 mL premix        2 g 200 mL/hr over 30 Minutes Intravenous To Radiology 08/15/20 1346 08/17/20 1111   07/22/20 0830  cefTRIAXone (ROCEPHIN) 2 g in sodium chloride 0.9 % 100 mL IVPB        2 g 200 mL/hr over 30 Minutes Intravenous Every 24 hours 07/22/20 0720 07/26/20 0858       Time spent: 20 minutes    Erin Hearing ANP  [facility] 7 am - 330 pm/M-F for direct patient care and secure chat Please refer to Amion for contact information 90 days

## 2020-10-20 NOTE — Progress Notes (Signed)
  Speech Language Pathology Treatment: Dysphagia  Patient Details Name: Antonio Navarro MRN: [id number] DOB: [birth year] Today's Date: 10/20/2020 Time: 6283-6629 SLP Time Calculation (min) (ACUTE ONLY): 9 min  Assessment / Plan / Recommendation Clinical Impression  Pt was seen for a brief session, as he needed to have other testing done in his room (ECHO) and he needed to be cleaned up first. Maudie Mercury was present upon SLP arrival and assisting pt by feeding him his lunch meal with no overt s/s of aspiration. He needs extra time for mastication, and she monitors this well. Pt had no oral holding observed today, but Maudie Mercury says that he is still doing this throughout meals and is spitting out food that sits in his mouth. Education was provided about current recommendations and POC. Will continue to follow.    HPI HPI: Pt is a 60 yo male s/p arrest with downtime 9 minutes. Initial rhythm was V. fib, shocked and received 6 rounds of epi before ROSC achieved.  Total CPR time was 30 minutes. ETT 12/4-12/13. MRI 12/7 suggestive of a widespread anoxic injury. PMH includes kidney stones and HTN.      SLP Plan  Continue with current plan of care       Recommendations  Diet recommendations: Dysphagia 2 (fine chop);Thin liquid Liquids provided via: Cup;Straw Medication Administration: Crushed with puree Supervision: Staff to assist with self feeding;Full supervision/cueing for compensatory strategies Compensations: Slow rate;Small sips/bites;Minimize environmental distractions Postural Changes and/or Swallow Maneuvers: Seated upright 90 degrees                Oral Care Recommendations: Oral care BID Follow up Recommendations: Skilled Nursing facility SLP Visit Diagnosis: Dysphagia, unspecified (R13.10) Plan: Continue with current plan of care       GO                Osie Bond., M.A. [place] [facility] Pager [phone] Office [phone]  10/20/2020, 2:17 PM

## 2020-10-20 NOTE — Progress Notes (Signed)
Occupational Therapy Treatment Patient Details Name: Antonio Navarro MRN: [id number] DOB: 05-27-1961 Today's Date: 10/20/2020    History of present illness [name] with history of tobacco abuse, obesity, HTN who was admitted 07/22/20 post cardiac arrest with inferolateral STEMI. He was resuscitated in the field by EMS after being found to be in V fib. Approximately 30 minutes of CPR. He was intubated on arrival.  Pt with anoxic brain injury. PEG placed on 08/17/20.   OT comments  Patient seen prior to lunch this date.  He continues to need tactile and verbal cues to initiate requested activity.  Patient was found with his condom cath removed and urine soaked gown and pad.  The patient continues to struggle from a cognitive capacity.  He does not have the insight to understand he has gone to the bathroom, whom to call, or how to resolve the issue without verbal and physical cues.  NT informed about the cath, and that he is up in the recliner with all sheets removed.  NT in the room to finish feeding the patient.  His goals will need to be updated next week, OT will need to consider his capacity to improve functionally from this point.  His frequency may need to be adjusted done in preparation of discharge from OT.  SNF is recommended unless family can provide the necessary 24 hour care needed.    Follow Up Recommendations  SNF;Supervision/Assistance - 24 hour    Equipment Recommendations  Wheelchair (measurements OT);Wheelchair cushion (measurements OT);Hospital bed;3 in 1 bedside commode    Recommendations for Other Services      Precautions / Restrictions Precautions Precautions: Fall Restrictions Weight Bearing Restrictions: No       Mobility Bed Mobility   Bed Mobility: Supine to Sit     Supine to sit: Min assist;Mod assist     General bed mobility comments: tactile and verbal cues to initiate    Transfers     Transfers: Sit to/from Stand;Stand Pivot Transfers Sit to  Stand: Min assist Stand pivot transfers: Min assist       General transfer comment: HHA    Balance Overall balance assessment: Needs assistance Sitting-balance support: Feet supported Sitting balance-Leahy Scale: Good Sitting balance - Comments: patient tends to lean forward and place elbows on knees     Standing balance-Leahy Scale: Poor Standing balance comment: reliant on UE support this session and cueing for direction                           ADL either performed or assessed with clinical judgement   ADL Overall ADL's : Needs assistance/impaired     Grooming: Wash/dry hands;Wash/dry face;Sitting;Standing;Set up;Min guard;Cueing for sequencing       Lower Body Bathing: Maximal assistance;Sit to/from stand Lower Body Bathing Details (indicate cue type and reason): patient removed cath - noted urine soaked gown and pad.                     Functional mobility during ADLs: Minimal assistance;Cueing for safety                         Cognition  No real improvements noted.  He continues to struggle with command following, processing and complex thought.  Pertinent Vitals/ Pain       Pain Assessment: Faces Faces Pain Scale: No hurt Pain Intervention(s): Monitored during session                                                          Frequency    2x/wk       Progress Toward Goals  OT Goals(current goals can now be found in the care plan section)   Incremental if any.       Plan Discharge plan remains appropriate;Frequency remains appropriate    Co-evaluation                 AM-PAC OT "6 Clicks" Daily Activity     Outcome Measure   Help from another person eating meals?: A Little Help from another person taking care of personal grooming?: A Little Help from another person toileting, which includes  using toliet, bedpan, or urinal?: A Lot Help from another person bathing (including washing, rinsing, drying)?: A Lot Help from another person to put on and taking off regular upper body clothing?: A Little Help from another person to put on and taking off regular lower body clothing?: A Lot 6 Click Score: 15    End of Session    OT Visit Diagnosis: Muscle weakness (generalized) (M62.81);Pain;Other abnormalities of gait and mobility (R26.89);Cognitive communication deficit (R41.841);Low vision, both eyes (H54.2)   Activity Tolerance Patient tolerated treatment well   Patient Left in chair;with call bell/phone within reach;with chair alarm set   Nurse Communication Other (comment) (condom cath off and sheets removed.  Patient up in recliner)        Time: 1610-9604 OT Time Calculation (min): 16 min  Charges: OT General Charges $OT Visit: 1 Visit OT Treatments $Self Care/Home Management : 8-22 mins  10/20/2020  Rich, OTR/L  Acute Rehabilitation Services  Office:  [place] 10/20/2020, 1:57 PM

## 2020-10-20 NOTE — Progress Notes (Signed)
  Echocardiogram 2D Echocardiogram has been performed.  Luisa Hart RDCS 10/20/2020, 1:51 PM

## 2020-10-21 LAB — COMPREHENSIVE METABOLIC PANEL
ALT: 70 U/L — ABNORMAL HIGH (ref 0–44)
AST: 32 U/L (ref 15–41)
Albumin: 3.2 g/dL — ABNORMAL LOW (ref 3.5–5.0)
Alkaline Phosphatase: 72 U/L (ref 38–126)
Anion gap: 11 (ref 5–15)
BUN: 15 mg/dL (ref 6–20)
CO2: 23 mmol/L (ref 22–32)
Calcium: 9.3 mg/dL (ref 8.9–10.3)
Chloride: 103 mmol/L (ref 98–111)
Creatinine, Ser: 1.21 mg/dL (ref 0.61–1.24)
GFR, Estimated: >60 mL/min (ref >60)
Glucose, Bld: 98 mg/dL (ref 70–99)
Potassium: 4.3 mmol/L (ref 3.5–5.1)
Sodium: 137 mmol/L (ref 135–145)
Total Bilirubin: 0.8 mg/dL (ref 0.3–1.2)
Total Protein: 7.3 g/dL (ref 6.5–8.1)

## 2020-10-21 LAB — CBC
HCT: 38.8 % — ABNORMAL LOW (ref 39.0–52.0)
Hemoglobin: 12.6 g/dL — ABNORMAL LOW (ref 13.0–17.0)
MCH: 31.2 pg (ref 26.0–34.0)
MCHC: 32.5 g/dL (ref 30.0–36.0)
MCV: 96 fL (ref 80.0–100.0)
Platelets: 216 10*3/uL (ref 150–400)
RBC: 4.04 MIL/uL — ABNORMAL LOW (ref 4.22–5.81)
RDW: 13.2 % (ref 11.5–15.5)
WBC: 8.8 10*3/uL (ref 4.0–10.5)
nRBC: 0 % (ref 0.0–0.2)

## 2020-10-21 LAB — GLUCOSE, CAPILLARY
Glucose-Capillary: 110 mg/dL — ABNORMAL HIGH (ref 70–99)
Glucose-Capillary: 119 mg/dL — ABNORMAL HIGH (ref 70–99)
Glucose-Capillary: 186 mg/dL — ABNORMAL HIGH (ref 70–99)
Glucose-Capillary: 197 mg/dL — ABNORMAL HIGH (ref 70–99)

## 2020-10-21 NOTE — Progress Notes (Signed)
PROGRESS NOTE    Antonio Navarro  MRN:[id number] DOB: 10/06/1960 DOA: 07/22/2020 PCP: Patient, No Pcp Per   Brief Narrative:  60-year-old male history of hypertension prescribed Norvasc prior to admission, chronic back pain, prior renal calculi who experienced an out of hospital cardiac arrest with downtime of 9 minutes.  Initial rhythm was ventricular fibrillation.  He was defibrillated deceived 6 doses of epi before ROSC.  Subsequent work-up revealed STEMI.  He underwent cardiac catheterization with a DES placed to the circumflex.  Cardiology following and has placed patient on Brilinta and aspirin and is also being treated with beta-blocker and statins.  From a neurological standpoint he clinically has anoxic brain injury.  During this hospitalization he experienced AKI and was treated with fluids and correction of hypoperfusion from cardiac arrest.  Echo performed this admission revealed ischemic cardiomyopathy with an EF of 40%  In addition to above patient has been unable to pass swallowing evaluations and subsequently has undergone percutaneous PEG tube placement on 12/30.  Patient is awake and moving all extremities spontaneously but does not follow commands and does not verbalize palliative care has met with patient and family and has emphasized poor prognosis and will continue to meet with the family during the hospitalization.  Current plan is to transition to SNF bed once available.  Expect need for lifelong 24/7 care for all ADLs.  Patient also developed a rash which is improving.   He continues to wait for placement.  Assessment & Plan   Cardiac arrest secondary to STEMI -occurred outside of the hospital -ROSC and subsequent catheterization revealed disease in the circumflex requiring DES -Continue Coreg, statin, Plavix, isosorbide, hydralazine  Ischemic cardiomyopathy -Echocardiogram showed an EF of 40% -Currently not on ACE inhibitor due to underlying kidney disease -Appears  to be euvolemic and compensated -Monitor intake and output, daily weights -Echocardiogram repeated on 10/21/2019 shows an EF of 40 to 45%,  LV regional wall motion abnormalities, mild LVH, grade 1 diastolic dysfunction.  Severe akinesis of the left ventricular, basal mid inferior wall  Significant anoxic brain injury secondary to cardiac arrest -Patient is alert and oriented to self at this time -He is awake and participating with PT and OT as well as speech therapy -Currently on full liquid diet -Currently no hypertonicity or spasticity -Continue Seroquel and Symmetrel -Still pending placement  White lesions/rash on back and buttocks -Appeared after patient was found laying on his feeding tube -Areas are improving slowly, and currently drying up  Essential hypertension -Patient was on amlodipine prior to admission -Continue medications as listed above  Nonsustained VT -Continue Coreg. No longer on amiodarone -Cardiology consulted and appreciated  Acute kidney injury on chronic disease, stage IIIb -Resolved -Likely prerenal given hypoperfusion from cardiac arrest -Baseline creatinine approximate 1.6-2, currently 1.21 -Continue to monitor BMP intermittently  Dysphagia, moderate protein calorie malnutrition -Speech therapy has been working with patient, currently on dysphagia 2 diet -Patient does have PEG tube in place  Skin tear right lateral back -Continue wound care and dressing changes  Hypernatremia -Resolved, no longer receiving free water flushes per tube  Hyperglycemia -Patient without a history or diagnosis of diabetes mellitus -Suspect secondary to stress -Hemoglobin A1c 4.7 -Continue Levemir, sliding scale, CBG monitoring  Acute hypoxemic respiratory failure -Resolved, currently on room air and maintaining oxygen saturations in the high 90s  E. coli UTI -Patient completed 5 days of Bactrim  Situational depression -Celexa started on 10/05/2019 and dose was  increased on 218  DVT Prophylaxis    heparin  Code Status: Full  Family Communication: None at bedside  Disposition Plan:  Status is: Inpatient  Remains inpatient appropriate because:Unsafe d/c plan  Dispo: The patient is from: Home              Anticipated d/c is to: SNF              Patient currently is medically stable to d/c.   Difficult to place patient Yes  Consultants Cardiology Interventional radiology PCCM Neurology Inpatient rehab  Procedures  Cardiac catheterization 12/4 Continuous EEG monitoring 12/4 and overnight EEG with video 12/5 Echocardiogram 12/5 EEG 12/9 Percutaneous PEG tube placement 12/30  Antibiotics   Anti-infectives (From admission, onward)   Start     Dose/Rate Route Frequency Ordered Stop   10/14/20 1000  fluconazole (DIFLUCAN) tablet 100 mg        100 mg Oral Daily 10/14/20 0848 10/18/20 0932   09/13/20 1000  sulfamethoxazole-trimethoprim (BACTRIM) 200-40 MG/5ML suspension 20 mL        20 mL Per Tube Every 12 hours 09/13/20 0734 09/19/20 0926   09/13/20 0745  cefTRIAXone (ROCEPHIN) 1 g in sodium chloride 0.9 % 100 mL IVPB  Status:  Discontinued        1 g 200 mL/hr over 30 Minutes Intravenous Every 24 hours 09/13/20 0658 09/13/20 0734   08/17/20 0000  ceFAZolin (ANCEF) IVPB 2g/100 mL premix        2 g 200 mL/hr over 30 Minutes Intravenous To Radiology 08/15/20 1346 08/17/20 1111   07/22/20 0830  cefTRIAXone (ROCEPHIN) 2 g in sodium chloride 0.9 % 100 mL IVPB        2 g 200 mL/hr over 30 Minutes Intravenous Every 24 hours 07/22/20 0720 07/26/20 0858      Subjective:   Synetta Fail seen and examined today.  (Patient with anoxic brain injury.)  No complaints at this time.  Patient minimally interactive today.  Objective:   Vitals:   10/20/20 0608 10/20/20 1226 10/20/20 2100 10/21/20 0548  BP: (!) 146/85 107/80 (!) 145/90 (!) 149/94  Pulse: 83 74 85 78  Resp: 18 (!) _0 Temp: 97.8 F (36.6 C) (!) 97.5 F (36.4 C) 98 F (36.7  C) (!) 97.5 F (36.4 C)  TempSrc: Oral Oral Oral Oral  SpO2: 94% 97% 100% 96%  Weight:    84.7 kg  Height:        Intake/Output Summary (Last 24 hours) at 10/21/2020 0951 Last data filed at 10/21/2020 3007 Gross per 24 hour  Intake 1011 ml  Output 500 ml  Net 511 ml   Filed Weights   10/20/20 0042 10/20/20 0500 10/21/20 0548  Weight: 85.4 kg 85.4 kg 84.7 kg   Exam  General: Well developed, chronically ill-appearing, NAD  HEENT: NCAT, mucous membranes moist.   Cardiovascular: S1 S2 auscultated, RRR  Respiratory: Diminished however clear  Abdomen: Soft, nontender, nondistended, + bowel sounds, PEG placed  Extremities: warm dry without cyanosis clubbing or edema  Neuro: AAOx1 (self), moves extremities with ease  Data Reviewed: I have personally reviewed following labs and imaging studies  CBC: Recent Labs  Lab 10/21/20 0427  WBC 8.8  HGB 12.6*  HCT 38.8*  MCV 96.0  PLT 622   Basic Metabolic Panel: Recent Labs  Lab 10/21/20 0427  NA 137  K 4.3  CL 103  CO2 23  GLUCOSE 98  BUN 15  CREATININE 1.21  CALCIUM 9.3   GFR: Estimated Creatinine Clearance: 73 mL/min (  by C-G formula based on SCr of 1.21 mg/dL). Liver Function Tests: Recent Labs  Lab 10/21/20 0427  AST 32  ALT 70*  ALKPHOS 72  BILITOT 0.8  PROT 7.3  ALBUMIN 3.2*   No results for input(s): LIPASE, AMYLASE in the last 168 hours. No results for input(s): AMMONIA in the last 168 hours. Coagulation Profile: No results for input(s): INR, PROTIME in the last 168 hours. Cardiac Enzymes: No results for input(s): CKTOTAL, CKMB, CKMBINDEX, TROPONINI in the last 168 hours. BNP (last 3 results) No results for input(s): PROBNP in the last 8760 hours. HbA1C: No results for input(s): HGBA1C in the last 72 hours. CBG: Recent Labs  Lab 10/20/20 0609 10/20/20 1213 10/20/20 1821 10/20/20 2355 10/21/20 0557  GLUCAP 101* 192* 165* 149* 110*   Lipid Profile: No results for input(s): CHOL, HDL,  LDLCALC, TRIG, CHOLHDL, LDLDIRECT in the last 72 hours. Thyroid Function Tests: No results for input(s): TSH, T4TOTAL, FREET4, T3FREE, THYROIDAB in the last 72 hours. Anemia Panel: No results for input(s): VITAMINB12, FOLATE, FERRITIN, TIBC, IRON, RETICCTPCT in the last 72 hours. Urine analysis:    Component Value Date/Time   COLORURINE YELLOW 10/05/2020 1040   APPEARANCEUR CLEAR 10/05/2020 1040   LABSPEC 1.008 10/05/2020 1040   PHURINE 6.0 10/05/2020 1040   GLUCOSEU NEGATIVE 10/05/2020 1040   HGBUR NEGATIVE 10/05/2020 1040   BILIRUBINUR NEGATIVE 10/05/2020 1040   KETONESUR NEGATIVE 10/05/2020 1040   PROTEINUR NEGATIVE 10/05/2020 1040   NITRITE NEGATIVE 10/05/2020 1040   LEUKOCYTESUR NEGATIVE 10/05/2020 1040   Sepsis Labs: _0 (procalcitonin:4,lacticidven:4)  )No results found for this or any previous visit (from the past 240 hour(s)).    Radiology Studies: ECHOCARDIOGRAM COMPLETE  Result Date: 10/20/2020    ECHOCARDIOGRAM REPORT   Patient Name:   [facility] Date of Exam: 10/20/2020 Medical Rec #:  [id number]      Height:       72.0 in Accession #:    [id number]     Weight:       188.3 lb Date of Birth:  June 17, 1961      BSA:          2.077 m Patient Age:    21 years       BP:           107/80 mmHg Patient Gender: M              HR:           79 bpm. Exam Location:  Inpatient Procedure: 2D Echo, Cardiac Doppler and Color Doppler Indications:    Cardiomyopathy, Ischemic  History:        Patient has prior history of Echocardiogram examinations, most                 recent 07/23/2020. Cardiomyopathy; Previous Myocardial                 Infarction.  Sonographer:    Luisa Hart RDCS Referring Phys: [id number] ALLISON L ELLIS  Sonographer Comments: No IV IMPRESSIONS  1. Left ventricular ejection fraction, by estimation, is 40 to 45%. The left ventricle has mildly decreased function. The left ventricle demonstrates regional wall motion abnormalities (see scoring diagram/findings for  description). There is mild left ventricular hypertrophy. Left ventricular diastolic parameters are consistent with Grade I diastolic dysfunction (impaired relaxation). There is severe akinesis of the left ventricular, basal-mid inferior wall.  2. Right ventricular systolic function is normal. The right ventricular size is normal.  3. The mitral  valve is grossly normal. No evidence of mitral valve regurgitation.  4. The aortic valve is tricuspid. Aortic valve regurgitation is not visualized.  5. The inferior vena cava is normal in size with greater than 50% respiratory variability, suggesting right atrial pressure of 3 mmHg. Comparison(s): No significant change from prior study. 07/23/2020: LVEF 40-45%, basal inferior WMA. FINDINGS  Left Ventricle: Left ventricular ejection fraction, by estimation, is 40 to 45%. The left ventricle has mildly decreased function. The left ventricle demonstrates regional wall motion abnormalities. Severe akinesis of the left ventricular, basal-mid inferior wall. The left ventricular internal cavity size was normal in size. There is mild left ventricular hypertrophy. Left ventricular diastolic parameters are consistent with Grade I diastolic dysfunction (impaired relaxation). Normal left ventricular filling pressure. Right Ventricle: The right ventricular size is normal. No increase in right ventricular wall thickness. Right ventricular systolic function is normal. Left Atrium: Left atrial size was normal in size. Right Atrium: Right atrial size was normal in size. Pericardium: There is no evidence of pericardial effusion. Mitral Valve: The mitral valve is grossly normal. No evidence of mitral valve regurgitation. Tricuspid Valve: The tricuspid valve is grossly normal. Tricuspid valve regurgitation is trivial. Aortic Valve: The aortic valve is tricuspid. Aortic valve regurgitation is not visualized. Aortic valve mean gradient measures 2.0 mmHg. Aortic valve peak gradient measures 3.4  mmHg. Aortic valve area, by VTI measures 2.36 cm. Pulmonic Valve: The pulmonic valve was normal in structure. Pulmonic valve regurgitation is not visualized. Aorta: The ascending aorta was not well visualized. Venous: The inferior vena cava is normal in size with greater than 50% respiratory variability, suggesting right atrial pressure of 3 mmHg. IAS/Shunts: No atrial level shunt detected by color flow Doppler.  LEFT VENTRICLE PLAX 2D LVIDd:         3.90 cm     Diastology LVIDs:         3.10 cm     LV e' medial:    5.00 cm/s LV PW:         1.60 cm     LV E/e' medial:  6.5 LV IVS:        1.00 cm     LV e' lateral:   7.51 cm/s LVOT diam:     2.10 cm     LV E/e' lateral: 4.3 LV SV:         35 LV SV Index:   17 LVOT Area:     3.46 cm  LV Volumes (MOD) LV vol d, MOD A2C: 29.8 ml LV vol d, MOD A4C: 70.7 ml LV vol s, MOD A2C: 15.7 ml LV vol s, MOD A4C: 41.2 ml LV SV MOD A2C:     14.1 ml LV SV MOD A4C:     70.7 ml LV SV MOD BP:      19.6 ml RIGHT VENTRICLE RV S prime:     11.00 cm/s TAPSE (M-mode): 1.4 cm LEFT ATRIUM             Index      RIGHT ATRIUM          Index LA diam:        2.90 cm 1.40 cm/m RA Area:     6.89 cm LA Vol (A2C):   17.6 ml 8.48 ml/m RA Volume:   10.60 ml 5.10 ml/m LA Vol (A4C):   20.2 ml 9.73 ml/m LA Biplane Vol: 20.2 ml 9.73 ml/m  AORTIC VALVE                     PULMONIC VALVE AV Area (Vmax):    2.65 cm    PV Vmax:       0.66 m/s AV Area (Vmean):   2.24 cm    PV Vmean:      57.700 cm/s AV Area (VTI):     2.36 cm    PV VTI:        0.134 m AV Vmax:           92.20 cm/s  PV Peak grad:  1.8 mmHg AV Vmean:          70.200 cm/s PV Mean grad:  1.0 mmHg AV VTI:            0.150 m AV Peak Grad:      3.4 mmHg AV Mean Grad:      2.0 mmHg LVOT Vmax:         70.50 cm/s LVOT Vmean:        45.400 cm/s LVOT VTI:          0.102 m LVOT/AV VTI ratio: 0.68  AORTA Ao Root diam: 3.10 cm Ao Asc diam:  3.00 cm MITRAL VALVE MV Area (PHT): 2.11 cm    SHUNTS MV Decel Time: 359 msec    Systemic VTI:  0.10 m MV E  velocity: 32.60 cm/s  Systemic Diam: 2.10 cm MV A velocity: 53.10 cm/s MV E/A ratio:  0.61 Kenneth Hilty MD Electronically signed by Kenneth Hilty MD Signature Date/Time: 10/20/2020/2:06:43 PM    Final      Scheduled Meds: . amantadine  100 mg Oral Daily  . aspirin  81 mg Oral Daily  . atorvastatin  80 mg Oral Daily  . carvedilol  25 mg Oral BID WC  . chlorhexidine gluconate (MEDLINE KIT)  15 mL Mouth Rinse BID  . citalopram  20 mg Oral Daily  . clopidogrel  75 mg Oral Daily  . enoxaparin (LOVENOX) injection  40 mg Subcutaneous Q24H  . feeding supplement  237 mL Oral BID BM  . feeding supplement (JEVITY 1.2 CAL)  237 mL Per Tube QID  . Gerhardt's butt cream   Topical TID  . hydrALAZINE  25 mg Oral Q8H  . insulin aspart  0-15 Units Subcutaneous Q6H  . isosorbide dinitrate  10 mg Oral TID  . mouth rinse  15 mL Mouth Rinse BID  . polyvinyl alcohol  1 drop Both Eyes QID  . QUEtiapine  25 mg Oral Daily  . sodium chloride flush  3 mL Intravenous Q12H   Continuous Infusions:    LOS: 91 days   Time Spent in minutes   20 minutes  Maryann Mikhail D.O. on 10/21/2020 at 9:51 AM  Between 7am to 7pm - Please see pager noted on [email or web address]  After 7pm go to [email or web address]  And look for the night coverage person covering for me after hours  [facility] Office  [phone] 

## 2020-10-22 LAB — GLUCOSE, CAPILLARY
Glucose-Capillary: 109 mg/dL — ABNORMAL HIGH (ref 70–99)
Glucose-Capillary: 142 mg/dL — ABNORMAL HIGH (ref 70–99)
Glucose-Capillary: 116 mg/dL — ABNORMAL HIGH (ref 70–99)

## 2020-10-22 NOTE — Progress Notes (Signed)
PROGRESS NOTE    Antonio Navarro  MRN:[id number] DOB: 11/05/1960 DOA: 07/22/2020 PCP: Patient, No Pcp Per   Brief Narrative:  60-year-old male history of hypertension prescribed Norvasc prior to admission, chronic back pain, prior renal calculi who experienced an out of hospital cardiac arrest with downtime of 9 minutes.  Initial rhythm was ventricular fibrillation.  He was defibrillated deceived 6 doses of epi before ROSC.  Subsequent work-up revealed STEMI.  He underwent cardiac catheterization with a DES placed to the circumflex.  Cardiology following and has placed patient on Brilinta and aspirin and is also being treated with beta-blocker and statins.  From a neurological standpoint he clinically has anoxic brain injury.  During this hospitalization he experienced AKI and was treated with fluids and correction of hypoperfusion from cardiac arrest.  Echo performed this admission revealed ischemic cardiomyopathy with an EF of 40%  In addition to above patient has been unable to pass swallowing evaluations and subsequently has undergone percutaneous PEG tube placement on 12/30.  Patient is awake and moving all extremities spontaneously but does not follow commands and does not verbalize palliative care has met with patient and family and has emphasized poor prognosis and will continue to meet with the family during the hospitalization.  Current plan is to transition to SNF bed once available.  Expect need for lifelong 24/7 care for all ADLs.  Patient also developed a rash which is improving.   He continues to wait for placement.  Assessment & Plan   Cardiac arrest secondary to STEMI -occurred outside of the hospital -ROSC and subsequent catheterization revealed disease in the circumflex requiring DES -Continue Coreg, statin, Plavix, isosorbide, hydralazine  Ischemic cardiomyopathy -Echocardiogram showed an EF of 40% -Currently not on ACE inhibitor due to underlying kidney disease -Appears  to be euvolemic and compensated -Monitor intake and output, daily weights -Echocardiogram repeated on 10/21/2019 shows an EF of 40 to 45%, LV regional wall motion abnormalities, mild LVH, grade 1 diastolic dysfunction.  Severe akinesis of the left ventricular, basal mid inferior wall  Significant anoxic brain injury secondary to cardiac arrest -Patient is alert and oriented to self at this time -He is awake and participating with PT and OT as well as speech therapy -Currently on full liquid diet -Currently no hypertonicity or spasticity -Continue Seroquel and Symmetrel -Still pending placement  White lesions/rash on back and buttocks -Appeared after patient was found laying on his feeding tube -Areas are improving slowly, and currently drying up  Essential hypertension -Patient was on amlodipine prior to admission -Continue medications as listed above  Nonsustained VT -Continue Coreg. No longer on amiodarone -Cardiology consulted and appreciated  Acute kidney injury on chronic disease, stage IIIb -Resolved -Likely prerenal given hypoperfusion from cardiac arrest -Baseline creatinine approximate 1.6-2, currently 1.21 -Continue to monitor BMP intermittently  Dysphagia, moderate protein calorie malnutrition -Speech therapy has been working with patient, currently on dysphagia 2 diet -Patient does have PEG tube in place  Skin tear right lateral back -Continue wound care and dressing changes  Hypernatremia -Resolved, no longer receiving free water flushes per tube  Hyperglycemia -Patient without a history or diagnosis of diabetes mellitus -Suspect secondary to stress -Hemoglobin A1c 4.7 -Continue Levemir, sliding scale, CBG monitoring  Acute hypoxemic respiratory failure -Resolved, currently on room air and maintaining oxygen saturations in the high 90s  E. coli UTI -Patient completed 5 days of Bactrim  Situational depression -Celexa started on 10/05/2019 and dose was  increased on 218  DVT Prophylaxis    heparin  Code Status: Full  Family Communication: None at bedside  Disposition Plan:  Status is: Inpatient  Remains inpatient appropriate because:Unsafe d/c plan  Dispo: The patient is from: Home              Anticipated d/c is to: SNF              Patient currently is medically stable to d/c.   Difficult to place patient Yes  Consultants Cardiology Interventional radiology PCCM Neurology Inpatient rehab  Procedures  Cardiac catheterization 12/4 Continuous EEG monitoring 12/4 and overnight EEG with video 12/5 Echocardiogram 12/5 EEG 12/9 Percutaneous PEG tube placement 12/30  Antibiotics   Anti-infectives (From admission, onward)   Start     Dose/Rate Route Frequency Ordered Stop   10/14/20 1000  fluconazole (DIFLUCAN) tablet 100 mg        100 mg Oral Daily 10/14/20 0848 10/18/20 0932   09/13/20 1000  sulfamethoxazole-trimethoprim (BACTRIM) 200-40 MG/5ML suspension 20 mL        20 mL Per Tube Every 12 hours 09/13/20 0734 09/19/20 0926   09/13/20 0745  cefTRIAXone (ROCEPHIN) 1 g in sodium chloride 0.9 % 100 mL IVPB  Status:  Discontinued        1 g 200 mL/hr over 30 Minutes Intravenous Every 24 hours 09/13/20 0658 09/13/20 0734   08/17/20 0000  ceFAZolin (ANCEF) IVPB 2g/100 mL premix        2 g 200 mL/hr over 30 Minutes Intravenous To Radiology 08/15/20 1346 08/17/20 1111   07/22/20 0830  cefTRIAXone (ROCEPHIN) 2 g in sodium chloride 0.9 % 100 mL IVPB        2 g 200 mL/hr over 30 Minutes Intravenous Every 24 hours 07/22/20 0720 07/26/20 0858      Subjective:   Synetta Fail seen and examined today.  (Patient with anoxic brain injury.)  No complaints today, minimally interactive  Objective:   Vitals:   10/22/20 0014 10/22/20 0241 10/22/20 0341 10/22/20 1158  BP:   (!) 153/94 118/82  Pulse:   75 73  Resp:   16 18  Temp:   98.1 F (36.7 C) 98.2 F (36.8 C)  TempSrc:   Oral Oral  SpO2:   96% 97%  Weight: 85.1 kg 85.1 kg     Height:        Intake/Output Summary (Last 24 hours) at 10/22/2020 1344 Last data filed at 10/22/2020 0128 Gross per 24 hour  Intake 357 ml  Output 1500 ml  Net -1143 ml   Filed Weights   10/21/20 0548 10/22/20 0014 10/22/20 0241  Weight: 84.7 kg 85.1 kg 85.1 kg   Exam (see no change in physical exam since 10/21/2020)  General: Well developed, chronically ill-appearing, NAD  HEENT: NCAT, mucous membranes moist.   Cardiovascular: S1 S2 auscultated, RRR  Respiratory: Diminished however clear  Abdomen: Soft, nontender, nondistended, + bowel sounds, PEG placed  Extremities: warm dry without cyanosis clubbing or edema  Neuro: AAOx1 (self), moves extremities with ease  Data Reviewed: I have personally reviewed following labs and imaging studies  CBC: Recent Labs  Lab 10/21/20 0427  WBC 8.8  HGB 12.6*  HCT 38.8*  MCV 96.0  PLT 935   Basic Metabolic Panel: Recent Labs  Lab 10/21/20 0427  NA 137  K 4.3  CL 103  CO2 23  GLUCOSE 98  BUN 15  CREATININE 1.21  CALCIUM 9.3   GFR: Estimated Creatinine Clearance: 73 mL/min (by C-G formula based on SCr of  1.21 mg/dL). Liver Function Tests: Recent Labs  Lab 10/21/20 0427  AST 32  ALT 70*  ALKPHOS 72  BILITOT 0.8  PROT 7.3  ALBUMIN 3.2*   No results for input(s): LIPASE, AMYLASE in the last 168 hours. No results for input(s): AMMONIA in the last 168 hours. Coagulation Profile: No results for input(s): INR, PROTIME in the last 168 hours. Cardiac Enzymes: No results for input(s): CKTOTAL, CKMB, CKMBINDEX, TROPONINI in the last 168 hours. BNP (last 3 results) No results for input(s): PROBNP in the last 8760 hours. HbA1C: No results for input(s): HGBA1C in the last 72 hours. CBG: Recent Labs  Lab 10/21/20 1212 10/21/20 1736 10/21/20 2352 10/22/20 0544 10/22/20 1156  GLUCAP 197* 119* 186* 109* 142*   Lipid Profile: No results for input(s): CHOL, HDL, LDLCALC, TRIG, CHOLHDL, LDLDIRECT in the last 72  hours. Thyroid Function Tests: No results for input(s): TSH, T4TOTAL, FREET4, T3FREE, THYROIDAB in the last 72 hours. Anemia Panel: No results for input(s): VITAMINB12, FOLATE, FERRITIN, TIBC, IRON, RETICCTPCT in the last 72 hours. Urine analysis:    Component Value Date/Time   COLORURINE YELLOW 10/05/2020 1040   APPEARANCEUR CLEAR 10/05/2020 1040   LABSPEC 1.008 10/05/2020 1040   PHURINE 6.0 10/05/2020 1040   GLUCOSEU NEGATIVE 10/05/2020 1040   HGBUR NEGATIVE 10/05/2020 1040   BILIRUBINUR NEGATIVE 10/05/2020 1040   KETONESUR NEGATIVE 10/05/2020 1040   PROTEINUR NEGATIVE 10/05/2020 1040   NITRITE NEGATIVE 10/05/2020 1040   LEUKOCYTESUR NEGATIVE 10/05/2020 1040   Sepsis Labs: $[email or web address]'[email or web address]$ (procalcitonin:4,lacticidven:4)  )No results found for this or any previous visit (from the past 240 hour(s)).    Radiology Studies: ECHOCARDIOGRAM COMPLETE  Result Date: 10/20/2020    ECHOCARDIOGRAM REPORT   Patient Name:   JAQUISE FAUX [facility] Date of Exam: 10/20/2020 Medical Rec #:  [id number]      Height:       72.0 in Accession #:    [id number]     Weight:       188.3 lb Date of Birth:  1961-06-18      BSA:          2.077 m Patient Age:    70 years       BP:           107/80 mmHg Patient Gender: M              HR:           79 bpm. Exam Location:  Inpatient Procedure: 2D Echo, Cardiac Doppler and Color Doppler Indications:    Cardiomyopathy, Ischemic  History:        Patient has prior history of Echocardiogram examinations, most                 recent 07/23/2020. Cardiomyopathy; Previous Myocardial                 Infarction.  Sonographer:    Luisa Hart RDCS Referring Phys: [id number] ALLISON L ELLIS  Sonographer Comments: No IV IMPRESSIONS  1. Left ventricular ejection fraction, by estimation, is 40 to 45%. The left ventricle has mildly decreased function. The left ventricle demonstrates regional wall motion abnormalities (see scoring diagram/findings for description). There is mild left ventricular hypertrophy.  Left ventricular diastolic parameters are consistent with Grade I diastolic dysfunction (impaired relaxation). There is severe akinesis of the left ventricular, basal-mid inferior wall.  2. Right ventricular systolic function is normal. The right ventricular size is normal.  3. The mitral valve is grossly normal. No evidence of  mitral valve regurgitation.  4. The aortic valve is tricuspid. Aortic valve regurgitation is not visualized.  5. The inferior vena cava is normal in size with greater than 50% respiratory variability, suggesting right atrial pressure of 3 mmHg. Comparison(s): No significant change from prior study. 07/23/2020: LVEF 40-45%, basal inferior WMA. FINDINGS  Left Ventricle: Left ventricular ejection fraction, by estimation, is 40 to 45%. The left ventricle has mildly decreased function. The left ventricle demonstrates regional wall motion abnormalities. Severe akinesis of the left ventricular, basal-mid inferior wall. The left ventricular internal cavity size was normal in size. There is mild left ventricular hypertrophy. Left ventricular diastolic parameters are consistent with Grade I diastolic dysfunction (impaired relaxation). Normal left ventricular filling pressure. Right Ventricle: The right ventricular size is normal. No increase in right ventricular wall thickness. Right ventricular systolic function is normal. Left Atrium: Left atrial size was normal in size. Right Atrium: Right atrial size was normal in size. Pericardium: There is no evidence of pericardial effusion. Mitral Valve: The mitral valve is grossly normal. No evidence of mitral valve regurgitation. Tricuspid Valve: The tricuspid valve is grossly normal. Tricuspid valve regurgitation is trivial. Aortic Valve: The aortic valve is tricuspid. Aortic valve regurgitation is not visualized. Aortic valve mean gradient measures 2.0 mmHg. Aortic valve peak gradient measures 3.4 mmHg. Aortic valve area, by VTI measures 2.36 cm. Pulmonic  Valve: The pulmonic valve was normal in structure. Pulmonic valve regurgitation is not visualized. Aorta: The ascending aorta was not well visualized. Venous: The inferior vena cava is normal in size with greater than 50% respiratory variability, suggesting right atrial pressure of 3 mmHg. IAS/Shunts: No atrial level shunt detected by color flow Doppler.  LEFT VENTRICLE PLAX 2D LVIDd:         3.90 cm     Diastology LVIDs:         3.10 cm     LV e' medial:    5.00 cm/s LV PW:         1.60 cm     LV E/e' medial:  6.5 LV IVS:        1.00 cm     LV e' lateral:   7.51 cm/s LVOT diam:     2.10 cm     LV E/e' lateral: 4.3 LV SV:         35 LV SV Index:   17 LVOT Area:     3.46 cm  LV Volumes (MOD) LV vol d, MOD A2C: 29.8 ml LV vol d, MOD A4C: 70.7 ml LV vol s, MOD A2C: 15.7 ml LV vol s, MOD A4C: 41.2 ml LV SV MOD A2C:     14.1 ml LV SV MOD A4C:     70.7 ml LV SV MOD BP:      19.6 ml RIGHT VENTRICLE RV S prime:     11.00 cm/s TAPSE (M-mode): 1.4 cm LEFT ATRIUM             Index      RIGHT ATRIUM          Index LA diam:        2.90 cm 1.40 cm/m RA Area:     6.89 cm LA Vol (A2C):   17.6 ml 8.48 ml/m RA Volume:   10.60 ml 5.10 ml/m LA Vol (A4C):   20.2 ml 9.73 ml/m LA Biplane Vol: 20.2 ml 9.73 ml/m  AORTIC VALVE  PULMONIC VALVE AV Area (Vmax):    2.65 cm    PV Vmax:       0.66 m/s AV Area (Vmean):   2.24 cm    PV Vmean:      57.700 cm/s AV Area (VTI):     2.36 cm    PV VTI:        0.134 m AV Vmax:           92.20 cm/s  PV Peak grad:  1.8 mmHg AV Vmean:          70.200 cm/s PV Mean grad:  1.0 mmHg AV VTI:            0.150 m AV Peak Grad:      3.4 mmHg AV Mean Grad:      2.0 mmHg LVOT Vmax:         70.50 cm/s LVOT Vmean:        45.400 cm/s LVOT VTI:          0.102 m LVOT/AV VTI ratio: 0.68  AORTA Ao Root diam: 3.10 cm Ao Asc diam:  3.00 cm MITRAL VALVE MV Area (PHT): 2.11 cm    SHUNTS MV Decel Time: 359 msec    Systemic VTI:  0.10 m MV E velocity: 32.60 cm/s  Systemic Diam: 2.10 cm MV A velocity:  53.10 cm/s MV E/A ratio:  0.61 Lyman Bishop MD Electronically signed by Lyman Bishop MD Signature Date/Time: 10/20/2020/2:06:43 PM    Final      Scheduled Meds: . amantadine  100 mg Oral Daily  . aspirin  81 mg Oral Daily  . atorvastatin  80 mg Oral Daily  . carvedilol  25 mg Oral BID WC  . chlorhexidine gluconate (MEDLINE KIT)  15 mL Mouth Rinse BID  . citalopram  20 mg Oral Daily  . clopidogrel  75 mg Oral Daily  . enoxaparin (LOVENOX) injection  40 mg Subcutaneous Q24H  . feeding supplement  237 mL Oral BID BM  . feeding supplement (JEVITY 1.2 CAL)  237 mL Per Tube QID  . Gerhardt's butt cream   Topical TID  . hydrALAZINE  25 mg Oral Q8H  . insulin aspart  0-15 Units Subcutaneous Q6H  . isosorbide dinitrate  10 mg Oral TID  . mouth rinse  15 mL Mouth Rinse BID  . polyvinyl alcohol  1 drop Both Eyes QID  . QUEtiapine  25 mg Oral Daily  . sodium chloride flush  3 mL Intravenous Q12H   Continuous Infusions:    LOS: 92 days   Time Spent in minutes   20 minutes  Maryann Mikhail D.O. on 10/22/2020 at 1:44 PM  Between 7am to 7pm - Please see pager noted on [email or web address]  After 7pm go to [email or web address]  And look for the night coverage person covering for me after hours  [facility] Office  [phone]

## 2020-10-22 NOTE — Plan of Care (Signed)
?  Problem: Clinical Measurements: ?Goal: Will remain free from infection ?Outcome: Progressing ?  ?

## 2020-10-23 LAB — GLUCOSE, CAPILLARY
Glucose-Capillary: 139 mg/dL — ABNORMAL HIGH (ref 70–99)
Glucose-Capillary: 104 mg/dL — ABNORMAL HIGH (ref 70–99)
Glucose-Capillary: 206 mg/dL — ABNORMAL HIGH (ref 70–99)
Glucose-Capillary: 96 mg/dL (ref 70–99)

## 2020-10-23 NOTE — Progress Notes (Signed)
Nutrition Follow-up  DOCUMENTATION CODES:   Not applicable  INTERVENTION:   -D/c calorie count -Continue Ensure Enlive po BID, each supplement provides 350 kcal and 20 grams of protein -Continue Ensure Enlive po BID, each supplement provides 350 kcal and 20 grams of protein -Continue Magic cup TID with meals, each supplement provides 290 kcal and 9 grams of protein -Bolus feedings:   1 ARC carton (237 ml) Jevity 1.2 QID   Tube feeding regimen provides 1140 kcal (50% of needs), 53 grams of protein, and 764 ml of H2O.  NUTRITION DIAGNOSIS:   Inadequate oral intake related to inability to eat as evidenced by NPO status.  Ongoing; advanced to a dysphagia 2 diet on 08/29/20  GOAL:   Patient will meet greater than or equal to 90% of their needs  Progressing   MONITOR:   Diet advancement,Labs,Weight trends,TF tolerance,Skin,I & O's  REASON FOR ASSESSMENT:   Consult,Ventilator Enteral/tube feeding initiation and management  ASSESSMENT:   Patient with PMH significant for HTN and kidney stones. Presents this admission s/p cardiac arrest.  12/13- NGT d/c 12/15- cortrak placed, tip of tube in the stomach 12/17- s/p BSE- recommend continue NPO 12/19- pt pulled cortrak tube, replaced- placement verified by x-ray (stomach) 12/20- s/p BSE- pt refusing PO trials 12/30- cortrak removed, PEG placed 1/5- Advanced to thin liquid diet 1/6- Advanced to full liquid diet 1/11- Advanced to dysphagia 2 diet 1/14- transitioned to nocturnal feedings by MD 1/18- TF d/c by MD 1/24- calorie count completed- pt consuming about 50% of needs PO; nocturnal feedings resumed  Reviewed I/O's: -739 ml x 24 hours and -1.5 L since 10/09/20  UOP: 975 ml x 24 hours  Pt sitting up in recliner chair, smiling at time of visit. No family at bedside.   Pt with erratic intake. Calorie count tickets not saved, so used meal completions to assess  3/5-3/6 Breakfast: 351 kcals, 18 grams protein Lunch:  192 kcals, 11 grams protein Dinner: nothing recorded Supplements: 2 Ensure Enlive (700 kcals, 40 grams protein)  Total intake: 1243 kcal (54% of minimum estimated needs)  69 grams protein (60% of minimum estimated needs)  3/5-3/6 Breakfast: 192 kcals, 11 grams protein Lunch: 337 kcals, 14 grams protein Dinner: 790 kcals, 40 grams protein Supplements: 1 Ensure Enlive (350 kcals, 20 grams protein)  Total intake: 1669 kcal (73% of minimum estimated needs)  85 grams protein (75% of minimum estimated needs)  Average total intake: 1456 kcal (63% of minimum estimated needs)  77 grams protein (67% of minimum estimated needs)  Pt meeting approximately 2596 kcals and 130 grams protein daily (100% of needs).   Labs reviewed: CBGS: 104-206 (inpatient orders for glycemic control are 0-15 units insulin aspart every 6 hours).   Diet Order:   Diet Order            DIET DYS 2 Room service appropriate? No; Fluid consistency: Thin  Diet effective now                 EDUCATION NEEDS:   Not appropriate for education at this time  Skin:  Skin Assessment: Skin Integrity Issues: Skin Integrity Issues:: Other (Comment) Other: MASD to buttocks, skin tear to rt upper back  Last BM:  10/23/20  Height:   Ht Readings from Last 1 Encounters:  09/03/20 6' (1.829 m)    Weight:   Wt Readings from Last 1 Encounters:  10/23/20 83.4 kg    Ideal Body Weight:  80.9 kg  BMI:  Body  mass index is 24.94 kg/m.  Estimated Nutritional Needs:   Kcal:  2300-2500 kcal  Protein:  115-130 grams  Fluid:  >/= 2 L/day    Loistine Chance, RD, LDN, [place] Registered Dietitian II Certified Diabetes Care and Education Specialist Please refer to [facility] for RD and/or RD on-call/weekend/after hours pager

## 2020-10-23 NOTE — TOC Progression Note (Signed)
Transition of Care [facility]) - Progression Note    Patient Details  Name: Antonio Navarro MRN: [id number] Date of Birth: 14-Feb-1961  Transition of Care [facility]) CM/SW [place], RN Phone Number: 10/23/2020, 2:57 PM  Clinical Narrative:    Case management called and left a message with [facility] in [place], [place] and [place], [place] to inquire about admission opportunity at the facility.  Secure message was left on the voicemail at this time and MSW and CM will follow up in the morning.   Expected Discharge Plan: Skilled Nursing Facility Barriers to Discharge: Continued Medical Work up,No SNF bed  Expected Discharge Plan and Services Expected Discharge Plan: [place] arrangements for the past 2 months: Single Family Home                                       Social Determinants of Health (SDOH) Interventions    Readmission Risk Interventions No flowsheet data found.

## 2020-10-23 NOTE — Progress Notes (Signed)
Physical Therapy Treatment Patient Details Name: Antonio Navarro MRN: [id number] DOB: May 23, 1961 Today's Date: 10/23/2020    History of Present Illness [name] with history of tobacco abuse, obesity, HTN who was admitted 07/22/20 post cardiac arrest with inferolateral STEMI. He was resuscitated in the field by EMS after being found to be in V fib. Approximately 30 minutes of CPR. He was intubated on arrival.  Pt with anoxic brain injury. PEG placed on 08/17/20.    PT Comments    Patient progressing slowly towards PT goals. Continues to require Min A for standing with manual assist to push through UEs instead of pulling up on RW to stand. Posterior bias upon transition to standing. Pt reports feeling tired this afternoon. Requires seated rest break due to fatigue. Requires Min A for gait training with use of RW for support with cues to widen BOS, RW management (pt listing right) and for overall balance. Continues to have significant cognitive deficits relating to orientation, awareness, attention, safety, memory and problem solving.  Will continue to follow and progress.   Follow Up Recommendations  SNF;Supervision/Assistance - 24 hour     Equipment Recommendations  Rolling walker with 5" wheels;3in1 (PT);Wheelchair (measurements PT);Wheelchair cushion (measurements PT)    Recommendations for Other Services       Precautions / Restrictions Precautions Precautions: Fall;Other (comment) Precaution Comments: g tube Restrictions Weight Bearing Restrictions: No    Mobility  Bed Mobility Overal bed mobility: Needs Assistance Bed Mobility: Sit to Supine       Sit to supine: Supervision   General bed mobility comments: No assist to bring LEs into bed to return to supine.    Transfers Overall transfer level: Needs assistance Equipment used: Rolling walker (2 wheeled) Transfers: Sit to/from Stand Sit to Stand: Min assist Stand pivot transfers: Min assist       General  transfer comment: Min A to power to standing with manual cues for hand placement; stood from chair x5. SPT chair to bed with Min A and help with RW management.  Ambulation/Gait Ambulation/Gait assistance: Min assist Gait Distance (Feet): 40 Feet (x2 bouts) Assistive device: Rolling walker (2 wheeled) Gait Pattern/deviations: Narrow base of support;Decreased stride length;Step-through pattern;Trunk flexed;Staggering right Gait velocity: decr   General Gait Details: Slow, unsteady gait with narrow BoS, listing to the right. Cues to widen BOS, upright posture and straight path with RW. 1 seated rest break.   Stairs             Wheelchair Mobility    Modified Rankin (Stroke Patients Only)       Balance Overall balance assessment: Needs assistance Sitting-balance support: Feet supported;No upper extremity supported Sitting balance-Leahy Scale: Good Sitting balance - Comments: Able to reach down and donn crocs with min assist   Standing balance support: During functional activity Standing balance-Leahy Scale: Poor Standing balance comment: reliant on UE support                            Cognition Arousal/Alertness: Awake/alert Behavior During Therapy: Flat affect Overall Cognitive Status: Impaired/Different from baseline Area of Impairment: Orientation;Attention;Memory;Following commands;Safety/judgement;Awareness;Problem solving                 Orientation Level: Disoriented to;Time;Situation Current Attention Level: Sustained Memory: Decreased short-term memory Following Commands: Follows multi-step commands inconsistently;Follows one step commands with increased time Safety/Judgement: Decreased awareness of safety Awareness: Intellectual Problem Solving: Slow processing;Difficulty sequencing;Decreased initiation;Requires verbal cues;Requires tactile cues  General Comments: Requires repetition to follow commands today. Appears frustrated with  questioning relating to orientation. Able to state he was in the hospital and "Antonio Navarro" with contextual cues from his sister that it was his sister's birth month.      Exercises      General Comments General comments (skin integrity, edema, etc.): Pt's sister, Antonio Navarro present during session.      Pertinent Vitals/Pain Pain Assessment: Faces Faces Pain Scale: Hurts a little bit Pain Location: generalized; bottom from sitting in chair too long Pain Descriptors / Indicators: Discomfort;Grimacing;Restless Pain Intervention(s): Monitored during session;Repositioned    Home Living                      Prior Function            PT Goals (current goals can now be found in the care plan section) Acute Rehab PT Goals Patient Stated Goal: to get better PT Goal Formulation: With patient Time For Goal Achievement: 11/06/20 Potential to Achieve Goals: Fair Progress towards PT goals: Progressing toward goals    Frequency    Min 3X/week      PT Plan Current plan remains appropriate    Co-evaluation              AM-PAC PT "6 Clicks" Mobility   Outcome Measure  Help needed turning from your back to your side while in a flat bed without using bedrails?: None Help needed moving from lying on your back to sitting on the side of a flat bed without using bedrails?: None Help needed moving to and from a bed to a chair (including a wheelchair)?: A Little Help needed standing up from a chair using your arms (e.g., wheelchair or bedside chair)?: A Little Help needed to walk in hospital room?: A Little Help needed climbing 3-5 steps with a railing? : A Lot 6 Click Score: 19    End of Session Equipment Utilized During Treatment: Gait belt Activity Tolerance: Patient tolerated treatment well;Patient limited by fatigue Patient left: in bed;with call bell/phone within reach;with bed alarm set;with family/visitor present Nurse Communication: Mobility status PT Visit Diagnosis:  Other abnormalities of gait and mobility (R26.89);Difficulty in walking, not elsewhere classified (R26.2)     Time: 1330-1400 PT Time Calculation (min) (ACUTE ONLY): 30 min  Charges:  $Gait Training: 8-22 mins $Therapeutic Activity: 8-22 mins                     [name], PT, DPT Acute Rehabilitation Services Pager [phone] Office [place] 10/23/2020, 3:39 PM

## 2020-10-23 NOTE — Progress Notes (Addendum)
PROGRESS NOTE    Antonio Navarro  WJX:[id number] DOB: 24-Dec-1960 DOA: 07/22/2020 PCP: Patient, No Pcp Per   Brief Narrative:  60 year old male history of hypertension prescribed Norvasc prior to admission, chronic back pain, prior renal calculi who experienced an out of hospital cardiac arrest with downtime of 9 minutes.  Initial rhythm was ventricular fibrillation.  He was defibrillated deceived 6 doses of epi before ROSC.  Subsequent work-up revealed STEMI.  He underwent cardiac catheterization with a DES placed to the circumflex.  Cardiology following and has placed patient on Brilinta and aspirin and is also being treated with beta-blocker and statins.  From a neurological standpoint he clinically has anoxic brain injury.  During this hospitalization he experienced AKI and was treated with fluids and correction of hypoperfusion from cardiac arrest.  Echo performed this admission revealed ischemic cardiomyopathy with an EF of 40%  In addition to above patient has been unable to pass swallowing evaluations and subsequently has undergone percutaneous PEG tube placement on 12/30.  Patient is awake and moving all extremities spontaneously but does not follow commands and does not verbalize palliative care has met with patient and family and has emphasized poor prognosis and will continue to meet with the family during the hospitalization.  Current plan is to transition to SNF bed once available.  Expect need for lifelong 24/7 care for all ADLs.  Patient also developed a rash which is improving.   He continues to wait for placement.  Assessment & Plan   Cardiac arrest secondary to STEMI -occurred outside of the hospital -ROSC and subsequent catheterization revealed disease in the circumflex requiring DES -Continue Coreg, statin, Plavix, isosorbide, hydralazine  Ischemic cardiomyopathy -Echocardiogram showed an EF of 40% -Currently not on ACE inhibitor due to underlying kidney disease -Appears  to be euvolemic and compensated -Monitor intake and output, daily weights -Echocardiogram repeated on 10/21/2019 shows an EF of 40 to 45%, LV regional wall motion abnormalities, mild LVH, grade 1 diastolic dysfunction.  Severe akinesis of the left ventricular, basal mid inferior wall  Significant anoxic brain injury secondary to cardiac arrest -Patient is alert and oriented to self at this time -He is awake and participating with PT and OT as well as speech therapy -Currently on full liquid diet -Currently no hypertonicity or spasticity -Continue Seroquel and Symmetrel -Still pending placement  White lesions/rash on back and buttocks -Appeared after patient was found laying on his feeding tube -Areas continue to improve   Essential hypertension -Patient was on amlodipine prior to admission -Continue medications as listed above  Nonsustained VT -Continue Coreg. No longer on amiodarone -Cardiology consulted and appreciated  Acute kidney injury on chronic disease, stage IIIb -Resolved -Likely prerenal given hypoperfusion from cardiac arrest -Baseline creatinine approximate 1.6-2, currently 1.21 -Continue to monitor BMP intermittently  Dysphagia, moderate protein calorie malnutrition -Speech therapy has been working with patient, currently on dysphagia 2 diet -Patient does have PEG tube in place  Skin tear right lateral back -Continue wound care and dressing changes  Hypernatremia -Resolved, no longer receiving free water flushes per tube  Hyperglycemia -Patient without a history or diagnosis of diabetes mellitus -Suspect secondary to stress -Hemoglobin A1c 4.7 -Continue Levemir, sliding scale, CBG monitoring  Acute hypoxemic respiratory failure -Resolved, currently on room air and maintaining oxygen saturations in the high 90s  E. coli UTI -Patient completed 5 days of Bactrim  Situational depression -Celexa started on 10/05/2019 and dose was increased on 218  Mildly  elevated ALT -ALT 70- will  continue to monitor CMP -Currently on statin. If no improvement, may consider holding  DVT Prophylaxis  heparin  Code Status: Full  Family Communication: None at bedside  Disposition Plan:  Status is: Inpatient  Remains inpatient appropriate because:Unsafe d/c plan  Dispo: The patient is from: Home              Anticipated d/c is to: SNF              Patient currently is medically stable to d/c.   Difficult to place patient Yes  Consultants Cardiology Interventional radiology PCCM Neurology Inpatient rehab  Procedures  Cardiac catheterization 12/4 Continuous EEG monitoring 12/4 and overnight EEG with video 12/5 Echocardiogram 12/5 EEG 12/9 Percutaneous PEG tube placement 12/30  Antibiotics   Anti-infectives (From admission, onward)   Start     Dose/Rate Route Frequency Ordered Stop   10/14/20 1000  fluconazole (DIFLUCAN) tablet 100 mg        100 mg Oral Daily 10/14/20 0848 10/18/20 0932   09/13/20 1000  sulfamethoxazole-trimethoprim (BACTRIM) 200-40 MG/5ML suspension 20 mL        20 mL Per Tube Every 12 hours 09/13/20 0734 09/19/20 0926   09/13/20 0745  cefTRIAXone (ROCEPHIN) 1 g in sodium chloride 0.9 % 100 mL IVPB  Status:  Discontinued        1 g 200 mL/hr over 30 Minutes Intravenous Every 24 hours 09/13/20 0658 09/13/20 0734   08/17/20 0000  ceFAZolin (ANCEF) IVPB 2g/100 mL premix        2 g 200 mL/hr over 30 Minutes Intravenous To Radiology 08/15/20 1346 08/17/20 1111   07/22/20 0830  cefTRIAXone (ROCEPHIN) 2 g in sodium chloride 0.9 % 100 mL IVPB        2 g 200 mL/hr over 30 Minutes Intravenous Every 24 hours 07/22/20 0720 07/26/20 0858      Subjective:   Antonio Navarro seen and examined today.  (Patient with anoxic brain injury.)  Patient has no complaints this morning.  Does smile at me and shakes and nods his head to yes/no questions.    Objective:   Vitals:   10/22/20 2108 10/23/20 0041 10/23/20 0506 10/23/20 0824  BP:  (!) 155/95  (!) 152/98 (!) 142/88  Pulse: 80  76 79  Resp: '18  18 16  ' Temp: 98.8 F (37.1 C)  97.8 F (36.6 C) 97.8 F (36.6 C)  TempSrc: Oral  Axillary Oral  SpO2: 99%  97% 98%  Weight:  83.4 kg 83.4 kg   Height:        Intake/Output Summary (Last 24 hours) at 10/23/2020 1054 Last data filed at 10/23/2020 0900 Gross per 24 hour  Intake 237 ml  Output 976 ml  Net -739 ml   Filed Weights   10/22/20 0241 10/23/20 0041 10/23/20 0506  Weight: 85.1 kg 83.4 kg 83.4 kg   Exam  General: Well developed, chronically ill-appearing, NAD  HEENT: NCAT,  mucous membranes moist.   Cardiovascular: S1 S2 auscultated, RRR  Respiratory: Diminished breath sounds, however clear  Abdomen: Soft, nontender, nondistended, + bowel sounds, PEG  Extremities: warm dry without cyanosis clubbing or edema  Neuro: AAOx1 (self), moves extremities with ease, does respond to some questions by shaking and nodding his head  Data Reviewed: I have personally reviewed following labs and imaging studies  CBC: Recent Labs  Lab 10/21/20 0427  WBC 8.8  HGB 12.6*  HCT 38.8*  MCV 96.0  PLT 916   Basic Metabolic Panel: Recent  Labs  Lab 10/21/20 0427  NA 137  K 4.3  CL 103  CO2 23  GLUCOSE 98  BUN 15  CREATININE 1.21  CALCIUM 9.3   GFR: Estimated Creatinine Clearance: 73 mL/min (by C-G formula based on SCr of 1.21 mg/dL). Liver Function Tests: Recent Labs  Lab 10/21/20 0427  AST 32  ALT 70*  ALKPHOS 72  BILITOT 0.8  PROT 7.3  ALBUMIN 3.2*   No results for input(s): LIPASE, AMYLASE in the last 168 hours. No results for input(s): AMMONIA in the last 168 hours. Coagulation Profile: No results for input(s): INR, PROTIME in the last 168 hours. Cardiac Enzymes: No results for input(s): CKTOTAL, CKMB, CKMBINDEX, TROPONINI in the last 168 hours. BNP (last 3 results) No results for input(s): PROBNP in the last 8760 hours. HbA1C: No results for input(s): HGBA1C in the last 72  hours. CBG: Recent Labs  Lab 10/22/20 0544 10/22/20 1156 10/22/20 1752 10/23/20 0030 10/23/20 0556  GLUCAP 109* 142* 116* 139* 104*   Lipid Profile: No results for input(s): CHOL, HDL, LDLCALC, TRIG, CHOLHDL, LDLDIRECT in the last 72 hours. Thyroid Function Tests: No results for input(s): TSH, T4TOTAL, FREET4, T3FREE, THYROIDAB in the last 72 hours. Anemia Panel: No results for input(s): VITAMINB12, FOLATE, FERRITIN, TIBC, IRON, RETICCTPCT in the last 72 hours. Urine analysis:    Component Value Date/Time   COLORURINE YELLOW 10/05/2020 1040   APPEARANCEUR CLEAR 10/05/2020 1040   LABSPEC 1.008 10/05/2020 1040   PHURINE 6.0 10/05/2020 1040   GLUCOSEU NEGATIVE 10/05/2020 1040   HGBUR NEGATIVE 10/05/2020 1040   BILIRUBINUR NEGATIVE 10/05/2020 1040   KETONESUR NEGATIVE 10/05/2020 1040   PROTEINUR NEGATIVE 10/05/2020 1040   NITRITE NEGATIVE 10/05/2020 1040   LEUKOCYTESUR NEGATIVE 10/05/2020 1040   Sepsis Labs: '@LABRCNTIP' (procalcitonin:4,lacticidven:4)  )No results found for this or any previous visit (from the past 240 hour(s)).    Radiology Studies: No results found.   Scheduled Meds: . amantadine  100 mg Oral Daily  . aspirin  81 mg Oral Daily  . atorvastatin  80 mg Oral Daily  . carvedilol  25 mg Oral BID WC  . chlorhexidine gluconate (MEDLINE KIT)  15 mL Mouth Rinse BID  . citalopram  20 mg Oral Daily  . clopidogrel  75 mg Oral Daily  . enoxaparin (LOVENOX) injection  40 mg Subcutaneous Q24H  . feeding supplement  237 mL Oral BID BM  . feeding supplement (JEVITY 1.2 CAL)  237 mL Per Tube QID  . Gerhardt's butt cream   Topical TID  . hydrALAZINE  25 mg Oral Q8H  . insulin aspart  0-15 Units Subcutaneous Q6H  . isosorbide dinitrate  10 mg Oral TID  . mouth rinse  15 mL Mouth Rinse BID  . polyvinyl alcohol  1 drop Both Eyes QID  . QUEtiapine  25 mg Oral Daily  . sodium chloride flush  3 mL Intravenous Q12H   Continuous Infusions:    LOS: 93 days   Time  Spent in minutes   20 minutes  Daxten Kovalenko D.O. on 10/23/2020 at 10:54 AM  Between 7am to 7pm - Please see pager noted on [email or web address]  After 7pm go to [email or web address]  And look for the night coverage person covering for me after hours  [facility] Office  [phone]

## 2020-10-24 LAB — GLUCOSE, CAPILLARY
Glucose-Capillary: 117 mg/dL — ABNORMAL HIGH (ref 70–99)
Glucose-Capillary: 125 mg/dL — ABNORMAL HIGH (ref 70–99)
Glucose-Capillary: 164 mg/dL — ABNORMAL HIGH (ref 70–99)

## 2020-10-24 LAB — COMPREHENSIVE METABOLIC PANEL
ALT: 61 U/L — ABNORMAL HIGH (ref 0–44)
AST: 28 U/L (ref 15–41)
Albumin: 3.1 g/dL — ABNORMAL LOW (ref 3.5–5.0)
Alkaline Phosphatase: 71 U/L (ref 38–126)
Anion gap: 8 (ref 5–15)
BUN: 17 mg/dL (ref 6–20)
CO2: 29 mmol/L (ref 22–32)
Calcium: 9.8 mg/dL (ref 8.9–10.3)
Chloride: 102 mmol/L (ref 98–111)
Creatinine, Ser: 1.3 mg/dL — ABNORMAL HIGH (ref 0.61–1.24)
GFR, Estimated: >60 mL/min (ref >60)
Glucose, Bld: 103 mg/dL — ABNORMAL HIGH (ref 70–99)
Potassium: 4.5 mmol/L (ref 3.5–5.1)
Sodium: 139 mmol/L (ref 135–145)
Total Bilirubin: 0.7 mg/dL (ref 0.3–1.2)
Total Protein: 7.4 g/dL (ref 6.5–8.1)

## 2020-10-24 NOTE — Progress Notes (Signed)
  Speech Language Pathology Treatment: Dysphagia;Cognitive-Linquistic  Patient Details Name: Antonio Navarro MRN: [id number] DOB: [birth year] Today's Date: 10/24/2020 Time: 9741-6384 SLP Time Calculation (min) (ACUTE ONLY): 19 min  Assessment / Plan / Recommendation Clinical Impression  Pt was seen for ongoing dysphagia and cognitive-linguistic tx. When given binary choices and cues for initiation, pt verbalized at the word level to make food and drink selections. He also engaged in self-feeding when given consistent cueing for initiation. Oral holding was noted with purees, but he responded well to use of liquid washes to clear his mouth. Pt participated in a verbal description task, also needing binary choices to complete. He was not able to produce any words in a simple divergent naming task; however, he did attempt reading one-word responses written by SLP. He read the first word aloud and demonstrated perseverative errors on the remaining words, alleviated when SLP provided phonemic cues. Pt will continue to benefit from ongoing SLP services.    HPI HPI: Pt is a 60 yo male s/p arrest with downtime 9 minutes. Initial rhythm was V. fib, shocked and received 6 rounds of epi before ROSC achieved.  Total CPR time was 30 minutes. ETT 12/4-12/13. MRI 12/7 suggestive of a widespread anoxic injury. PMH includes kidney stones and HTN.      SLP Plan  Continue with current plan of care       Recommendations  Diet recommendations: Dysphagia 2 (fine chop);Thin liquid Liquids provided via: Cup;Straw Medication Administration: Crushed with puree Supervision: Staff to assist with self feeding;Full supervision/cueing for compensatory strategies Compensations: Slow rate;Small sips/bites;Minimize environmental distractions Postural Changes and/or Swallow Maneuvers: Seated upright 90 degrees                Oral Care Recommendations: Oral care BID Follow up Recommendations: Skilled Nursing facility SLP  Visit Diagnosis: Dysphagia, unspecified (R13.10);Cognitive communication deficit (R41.841) Plan: Continue with current plan of care       GO                Osie Bond., M.A. [place] Acute Rehabilitation Services Pager [phone] Office [phone]  10/24/2020, 4:27 PM

## 2020-10-24 NOTE — Progress Notes (Signed)
PT Cancellation Note  Patient Details Name: Antonio Navarro MRN: [id number] DOB: 1961-01-11   Cancelled Treatment:    Reason Eval/Treat Not Completed: Patient declined, no reason specified pt asleep upon arrival; attempted to coax/motivate pt to participate in therapy services but pt not responding or interacting with therapist this date. Not responding to questions. Shaking head no a few times. Will follow.   [name] 10/24/2020, 12:23 PM [name], PT, DPT Acute Rehabilitation Services Pager [phone] Office [phone]

## 2020-10-24 NOTE — Progress Notes (Signed)
[facility] PROGRESS NOTE  Antonio Navarro FGH:[id number] DOB: 1960/08/27 DOA: 07/22/2020 PCP: Patient, No Pcp Per       Status: Remains inpatient appropriate because:Altered mental status and Unsafe d/c plan   Dispo: The patient is from: Home              Anticipated d/c is to: SNF              Anticipated d/c date is: > 3 days              Patient currently is medically stable to d/c.  Barriers to discharge: Medicaid and disability pending. No SNF bed offers.  No further nocturnal agitation or wandering behavior so hopeful will be eligible for SNF bed offer.  Patient able to ambulate 300 feet so hopefully he can eventually discharge home with family as long as they can provide appropriate 24/7 monitoring.  On 2/28 TOC contacted at [facility]   Code Status: Full Family Communication: Updated Sister [name] (who works as a Recruitment consultant from 6 AM to 11 AM).  She is aware that major restriction regarding placement has to do with Medicaid application still pending and many facilities are reluctant to accept a long-term patient without Medicaid in place. DVT prophylaxis: sq heparin Vaccination status: Completed both doses of Pfizer Covid vaccination on 12/14/2019; Sawpit booster ordered to be given on 3/3 Foley catheter: No  HPI: 60 year old male history of hypertension prescribed Norvasc prior to admission, chronic back pain, prior renal calculi who experienced an out of hospital cardiac arrest with downtime of 9 minutes.  Initial rhythm was ventricular fibrillation.  He was defibrillated deceived 6 doses of epi before ROSC.  Subsequent work-up revealed STEMI.  He underwent cardiac catheterization with a DES placed to the circumflex.  Cardiology following and has placed patient on Brilinta and aspirin and is also being treated with beta-blocker and statins.  From a neurological standpoint he clinically has anoxic brain injury.  During this hospitalization he experienced  AKI and was treated with fluids and correction of hypoperfusion from cardiac arrest.  Echo performed this admission revealed ischemic cardiomyopathy with an EF of 40%  In addition to above patient has been unable to pass swallowing evaluations and subsequently has undergone percutaneous PEG tube placement on 12/30.  Patient is awake and moving all extremities spontaneously but does not follow commands and does not verbalize palliative care has met with patient and family and has emphasized poor prognosis and will continue to meet with the family during the hospitalization.  Current plan is to transition to SNF bed once available.  Expect need for lifelong 24/7 care for all ADLs.  Subjective: Awake and alert.  Smiling and interactive today.  Patient's nurse is at bedside.  States patient did take his oral medications today.  Did not wish to eat his breakfast.  Of note he typically does not eat breakfast but eats better for lunch and supper especially if assisted with meals.  Objective: Vitals:   10/23/20 2035 10/24/20 0549  BP: 128/90 129/80  Pulse: 81 81  Resp:  15  Temp: 98.2 F (36.8 C) 98.3 F (36.8 C)  SpO2: 99% 98%    Intake/Output Summary (Last 24 hours) at 10/24/2020 0749 Last data filed at 10/24/2020 0500 Gross per 24 hour  Intake 557 ml  Output 1000 ml  Net -443 ml   Filed Weights   10/23/20 0041 10/23/20 0506 10/24/20 0549  Weight: 83.4 kg 83.4 kg 84.3 kg  Exam: Constitutional: Awake, alert and in no acute distress, calm affect Heart: Normal heart sounds, pulses regular, no peripheral edema Respiratory:  Abdomen: Soft nontender nondistended.  Variable intake of D2 diet .  Bowel sounds are present LBM 3/2 Neurologic: CN 2-12 roughly without any appreciable focal neurological deficits, MOE x4, strength is 5/5.   Psychiatric: Awake and alert.  Oriented times name.  Nods appropriately but does not speak although at times is able to speak. Skin: Multiple flat dry areas on  back w/ a more confluent area mid back that is covered by a dressing   Assessment/Plan: Acute problems: Out of hospital cardiac arrest secondary to STEMI -ROSC and subsequent cardiac catheterization revealed dz in circumflex requiring DES -Continue beta-blocker, statin, Plavix, isosorbide and hydralazine  Significant anoxic brain injury secondary to cardiac arrest/deconditioning/gait instability -Consistently participating with PT and OT.  SLP has cleared for dysphagia 2 with thin liquids -Declined by CIR since family unable to provide 24/7 care.  Plan is for SNF -Continue Seroquel 100 mg at at bedtime -2/18 add Seroquel 25 mg every 12 hours as needed for agitation and wandering -Continue daytime Symmetrel  **PT NOTES FROM 3/7**  Patient progressing slowly towards PT goals. Continues to require Min A for standing with manual assist to push through UEs instead of pulling up on RW to stand. Posterior bias upon transition to standing. Pt reports feeling tired this afternoon. Requires seated rest break due to fatigue. Requires Min A for gait training with use of RW for support with cues to widen BOS, RW management (pt listing right) and for overall balance. Continues to have significant cognitive deficits relating to orientation, awareness, attention, safety, memory and problem solving.  Will continue to follow and progress.   White lesions/rash on back and buttocks -Appeared after patient had laying in tube feeding    10/10/20                           10/10/20     10/16/2020     10/20/2020                                     10/20/2020 -As of 3/4 wounds not covered by dressing are drying up and are clearing.  Have opted to remove wounds covered by dressing to allow to dry up and clear as well.  Situational depression -Started Celexa 2/16-increased to 20 mg 2/18  Ischemic cardiomyopathy -Echo this admission EF 40% -Continue hydralazine and Isordil -No ACE inhibitor secondary to underlying  kidney disease -Will need follow-up echo 3 months post initial echo which was completed on 07/23/2020-will obtain ECHO on 3/4  Hypertension -Was on Norvasc prior to admission -Continue cardiovascular meds as listed above  Acute on chronic kidney disease stage IIIb -Suspect prerenal etiology in the setting of hypoperfusion from ventricular fibrillation arrest -Baseline creatinine between 1.6 and 2; creatinine on 12/19 was 1.67-will repeat in a.m. on 08/19/2020 -Continue free water per tube and intermittently follow labs  Dysphagia, moderate protein calorie malnutrition Nutrition Problem: Inadequate oral intake Etiology: inability to eat Signs/Symptoms: NPO status  -Continue D2 diet noting patient with improved oral intake.  Will discontinue nocturnal tube feedings for now continue to follow -Add Ensure beverage twice daily between meals. Interventions: Tube feeding,Prostat Estimated body mass index is 25.21 kg/m as calculated from the following:   Height as of this encounter:  6' (1.829 m).   Weight as of this encounter: 84.3 kg. -3/2 have changed all per tube meds to oral. If patient able to maintain adequate oral intake of both medications food and fluids will consider removing PEG tube to allow patient to discharge to a nonskilled nursing facility if necessary    Other problems: Acute hypernatremia -Continue free water per tube  E. coli UTI -Has completed 5 days of Bactrim  Hyperglycemia without an underlying diagnosis of diabetes mellitus Resolved -Likely related to acute stress -Recent issues with hypoglycemia so sliding scale insulin decreased to very sensitive and Levemir decreased from 30 twice daily to 15 twice daily as of 1/14 -Hemoglobin A1c 4.7   Acute hypoxemic respiratory failure -Resolved -Stable on room air with O2 sats between 97 and 99%  Nonsustained VT -Continue amiodarone and beta-blocker -1/31 LFTs normal  Skin tear right lateral back Wound / Incision  (Open or Dehisced) 07/28/20 Skin tear Back Lateral;Right;Upper (Active)  Date First Assessed/Time First Assessed: 07/28/20 0300   Wound Type: Skin tear  Location: Back  Location Orientation: Lateral;Right;Upper  Present on Admission: No    Assessments 07/28/2020  7:49 PM 10/23/2020  8:42 PM  Dressing Type None None  Site / Wound Assessment - Clean;Dry  Drainage Amount None -  Treatment Cleansed Cleansed     No Linked orders to display    Data Reviewed: Basic Metabolic Panel: Recent Labs  Lab 10/21/20 0427 10/24/20 0423  NA 137 139  K 4.3 4.5  CL 103 102  CO2 23 29  GLUCOSE 98 103*  BUN 15 17  CREATININE 1.21 1.30*  CALCIUM 9.3 9.8   Liver Function Tests: Recent Labs  Lab 10/21/20 0427 10/24/20 0423  AST 32 28  ALT 70* 61*  ALKPHOS 72 71  BILITOT 0.8 0.7  PROT 7.3 7.4  ALBUMIN 3.2* 3.1*   No results for input(s): LIPASE, AMYLASE in the last 168 hours. No results for input(s): AMMONIA in the last 168 hours. CBC: Recent Labs  Lab 10/21/20 0427  WBC 8.8  HGB 12.6*  HCT 38.8*  MCV 96.0  PLT 216   Cardiac Enzymes: No results for input(s): CKTOTAL, CKMB, CKMBINDEX, TROPONINI in the last 168 hours. BNP (last 3 results) No results for input(s): BNP in the last 8760 hours.  ProBNP (last 3 results) No results for input(s): PROBNP in the last 8760 hours.  CBG: Recent Labs  Lab 10/23/20 0556 10/23/20 1209 10/23/20 1654 10/24/20 0041 10/24/20 0547  GLUCAP 104* 206* 96 164* 117*    No results found for this or any previous visit (from the past 240 hour(s)).   Studies: No results found.  Scheduled Meds: . amantadine  100 mg Oral Daily  . aspirin  81 mg Oral Daily  . atorvastatin  80 mg Oral Daily  . carvedilol  25 mg Oral BID WC  . chlorhexidine gluconate (MEDLINE KIT)  15 mL Mouth Rinse BID  . citalopram  20 mg Oral Daily  . clopidogrel  75 mg Oral Daily  . enoxaparin (LOVENOX) injection  40 mg Subcutaneous Q24H  . feeding supplement  237 mL Oral BID  BM  . feeding supplement (JEVITY 1.2 CAL)  237 mL Per Tube QID  . Gerhardt's butt cream   Topical TID  . hydrALAZINE  25 mg Oral Q8H  . insulin aspart  0-15 Units Subcutaneous Q6H  . isosorbide dinitrate  10 mg Oral TID  . mouth rinse  15 mL Mouth Rinse BID  . polyvinyl  alcohol  1 drop Both Eyes QID  . QUEtiapine  25 mg Oral Daily  . sodium chloride flush  3 mL Intravenous Q12H   Continuous Infusions:   Active Problems:   Acute ST elevation myocardial infarction (STEMI) [facility])   Cardiac arrest (HCC)   Elevated blood pressure reading with diagnosis of hypertension   Shock circulatory (HCC)   Encephalopathy   History of ETT   Cardiomyopathy, ischemic   Anoxic brain damage ([place])   Dysphagia   DNR (do not resuscitate) discussion   Gait instability   E. coli UTI   Consultants:  Cardiology  Interventional radiology  PCCM  Neurology  Procedures:  Cardiac catheterization 12/4  Continuous EEG 12/4 and overnight EEG with video 12/5  Echocardiogram 12/5  EEG 12/9  Percutaneous PEG tube placement 12/30  Antibiotics: Anti-infectives (From admission, onward)   Start     Dose/Rate Route Frequency Ordered Stop   10/14/20 1000  fluconazole (DIFLUCAN) tablet 100 mg        100 mg Oral Daily 10/14/20 0848 10/18/20 0932   09/13/20 1000  sulfamethoxazole-trimethoprim (BACTRIM) 200-40 MG/5ML suspension 20 mL        20 mL Per Tube Every 12 hours 09/13/20 0734 09/19/20 0926   09/13/20 0745  cefTRIAXone (ROCEPHIN) 1 g in sodium chloride 0.9 % 100 mL IVPB  Status:  Discontinued        1 g 200 mL/hr over 30 Minutes Intravenous Every 24 hours 09/13/20 0658 09/13/20 0734   08/17/20 0000  ceFAZolin (ANCEF) IVPB 2g/100 mL premix        2 g 200 mL/hr over 30 Minutes Intravenous To Radiology 08/15/20 1346 08/17/20 1111   07/22/20 0830  cefTRIAXone (ROCEPHIN) 2 g in sodium chloride 0.9 % 100 mL IVPB        2 g 200 mL/hr over 30 Minutes Intravenous Every 24 hours 07/22/20 0720 07/26/20  0858       Time spent: 20 minutes    Erin Hearing ANP  [facility] 7 am - 330 pm/M-F for direct patient care and secure chat Please refer to Amion for contact information 94 days

## 2020-10-24 NOTE — Progress Notes (Signed)
Occupational Therapy Treatment Patient Details Name: Antonio Navarro MRN: [id number] DOB: 1960-09-05 Today's Date: 10/24/2020    History of present illness [name] with history of tobacco abuse, obesity, HTN who was admitted 07/22/20 post cardiac arrest with inferolateral STEMI. He was resuscitated in the field by EMS after being found to be in V fib. Approximately 30 minutes of CPR. He was intubated on arrival.  Pt with anoxic brain injury. PEG placed on 08/17/20.   OT comments   Pt received supine in bed upon arrival initially declining OT session but agreeable with encouragement. Pt continues to present with impaired balance, cognitive deficits and decreased activity tolerance. Pt sit<>stands from EOB with MOD A, pt able to take steps to door with MIN A but then impulsively decided to sit into recliner. Pt currently requires MAX A for LB ADLs and up to MOD A for functional mobility. Pt noted to verbalize more this session stating the names of his fiance and sisters. Pt would continue to benefit from skilled occupational therapy while admitted and after d/c to address the below listed limitations in order to improve overall functional mobility and facilitate independence with BADL participation. DC plan remains appropriate, will follow acutely per POC.     Follow Up Recommendations  SNF;Supervision/Assistance - 24 hour    Equipment Recommendations  Wheelchair (measurements OT);Wheelchair cushion (measurements OT);Hospital bed;3 in 1 bedside commode    Recommendations for Other Services      Precautions / Restrictions Precautions Precautions: Fall;Other (comment) Precaution Comments: g tube Restrictions Weight Bearing Restrictions: No       Mobility Bed Mobility Overal bed mobility: Needs Assistance Bed Mobility: Supine to Sit     Supine to sit: Mod assist;HOB elevated     General bed mobility comments: MOD A to elevate pts trunk into sitting and maintain BLEs off EOB as  pt tends to return to BLEs to supine    Transfers Overall transfer level: Needs assistance Equipment used: Rolling walker (2 wheeled) Transfers: Sit to/from Stand Sit to Stand: Mod assist;From elevated surface         General transfer comment: MOD A to rise from EOB d/t posterior lean    Balance Overall balance assessment: Needs assistance Sitting-balance support: Feet supported;No upper extremity supported Sitting balance-Leahy Scale: Good Sitting balance - Comments: sitting EOB with no LOB   Standing balance support: During functional activity;Bilateral upper extremity supported Standing balance-Leahy Scale: Poor Standing balance comment: reliant on UE support, heavy intitial posterior lean when standing from EOB                           ADL either performed or assessed with clinical judgement   ADL Overall ADL's : Needs assistance/impaired             Lower Body Bathing: Sitting/lateral leans;Maximal assistance Lower Body Bathing Details (indicate cue type and reason): simualted via LB dressing from EOB Upper Body Dressing : Minimal assistance;Sitting Upper Body Dressing Details (indicate cue type and reason): to don gown as back side cover Lower Body Dressing: Maximal assistance;Sitting/lateral leans Lower Body Dressing Details (indicate cue type and reason): to don shoes Toilet Transfer: Moderate assistance;Minimal assistance;RW;Ambulation Armed forces technical officer Details (indicate cue type and reason): simulated via functional mobiltiy with RW, MOD A to power into standing MIN A for functional mobility to recliner         Functional mobility during ADLs: Minimal assistance;Cueing for safety;Moderate assistance;Rolling walker General  ADL Comments: pt needed max encouragment but able to mobilize OOB to recliner with up to MOD A needed to rise from EOB     Vision       Perception     Praxis      Cognition Arousal/Alertness: Awake/alert Behavior During  Therapy: Flat affect Overall Cognitive Status: Impaired/Different from baseline Area of Impairment: Attention;Following commands;Safety/judgement;Awareness;Problem solving                   Current Attention Level: Sustained   Following Commands: Follows one step commands inconsistently;Follows one step commands with increased time Safety/Judgement: Decreased awareness of safety (prematurley sits into recliner) Awareness: Intellectual Problem Solving: Slow processing;Difficulty sequencing;Decreased initiation;Requires verbal cues;Requires tactile cues General Comments: pt verbalizing today, when asked where pts fiance is pt states, "shes around here somewhere" pt also able to state her name and his 2 sisters names correctly.        Exercises     Shoulder Instructions       General Comments      Pertinent Vitals/ Pain       Pain Assessment: Faces Faces Pain Scale: No hurt  Home Living                                          Prior Functioning/Environment              Frequency  Min 2X/week        Progress Toward Goals  OT Goals(current goals can now be found in the care plan section)  Progress towards OT goals: Progressing toward goals  Acute Rehab OT Goals OT Goal Formulation: Patient unable to participate in goal setting Time For Goal Achievement: 10/26/20 Potential to Achieve Goals: [place] Discharge plan remains appropriate;Frequency remains appropriate    Co-evaluation                 AM-PAC OT "6 Clicks" Daily Activity     Outcome Measure   Help from another person eating meals?: A Little Help from another person taking care of personal grooming?: A Little Help from another person toileting, which includes using toliet, bedpan, or urinal?: A Lot Help from another person bathing (including washing, rinsing, drying)?: A Lot Help from another person to put on and taking off regular upper body clothing?: A  Little Help from another person to put on and taking off regular lower body clothing?: A Lot 6 Click Score: 15    End of Session Equipment Utilized During Treatment: Gait belt;Rolling walker  OT Visit Diagnosis: Muscle weakness (generalized) (M62.81);Pain;Other abnormalities of gait and mobility (R26.89);Cognitive communication deficit (R41.841);Low vision, both eyes (H54.2) Symptoms and signs involving cognitive functions: Other Nontraumatic ICH   Activity Tolerance Patient tolerated treatment well   Patient Left in chair;with call bell/phone within reach;with chair alarm set   Nurse Communication Mobility status        Time: 6948-5462 OT Time Calculation (min): 15 min  Charges: OT General Charges $OT Visit: 1 Visit OT Treatments $Therapeutic Activity: 8-22 mins  [name]., COTA/L Acute Rehabilitation Services [phone]    Precious Haws 10/24/2020, 4:35 PM

## 2020-10-25 LAB — GLUCOSE, CAPILLARY
Glucose-Capillary: 110 mg/dL — ABNORMAL HIGH (ref 70–99)
Glucose-Capillary: 141 mg/dL — ABNORMAL HIGH (ref 70–99)
Glucose-Capillary: 157 mg/dL — ABNORMAL HIGH (ref 70–99)
Glucose-Capillary: 164 mg/dL — ABNORMAL HIGH (ref 70–99)

## 2020-10-25 NOTE — Progress Notes (Signed)
[facility] PROGRESS NOTE  Antonio Navarro YDX:[id number] DOB: [birth year] DOA: 07/22/2020 PCP: Patient, No Pcp Per       Status: Remains inpatient appropriate because:Altered mental status and Unsafe d/c plan   Dispo: The patient is from: Home              Anticipated d/c is to: SNF              Anticipated d/c date is: > 3 days              Patient currently is medically stable to d/c.  Barriers to discharge: Medicaid and disability pending. No SNF bed offers.  No further nocturnal agitation or wandering behavior so hopeful will be eligible for SNF bed offer.  Patient able to ambulate 300 feet so hopefully he can eventually discharge home with family as long as they can provide appropriate 24/7 monitoring.  On 2/28 TOC contacted at [facility]   Code Status: Full Family Communication: Updated Sister Antonio Navarro (who works as a Recruitment consultant from 6 AM to 11 AM).  She is aware that major restriction regarding placement has to do with Medicaid application still pending and many facilities are reluctant to accept a long-term patient without Medicaid in place. DVT prophylaxis: sq heparin Vaccination status: Completed both doses of Pfizer Covid vaccination on 12/14/2019; [place] booster ordered to be given on 3/3 Foley catheter: No  HPI: 60 year old male history of hypertension prescribed Norvasc prior to admission, chronic back pain, prior renal calculi who experienced an out of hospital cardiac arrest with downtime of 9 minutes.  Initial rhythm was ventricular fibrillation.  He was defibrillated deceived 6 doses of epi before ROSC.  Subsequent work-up revealed STEMI.  He underwent cardiac catheterization with a DES placed to the circumflex.  Cardiology following and has placed patient on Brilinta and aspirin and is also being treated with beta-blocker and statins.  From a neurological standpoint he clinically has anoxic brain injury.  During this hospitalization he experienced  AKI and was treated with fluids and correction of hypoperfusion from cardiac arrest.  Echo performed this admission revealed ischemic cardiomyopathy with an EF of 40%  In addition to above patient has been unable to pass swallowing evaluations and subsequently has undergone percutaneous PEG tube placement on 12/30.  Patient is awake and moving all extremities spontaneously but does not follow commands and does not verbalize palliative care has met with patient and family and has emphasized poor prognosis and will continue to meet with the family during the hospitalization.  Current plan is to transition to SNF bed once available.  Expect need for lifelong 24/7 care for all ADLs.  Subjective: Awake.  Laying on left side.  Smiled when spoken to but did not reply with verbal response.  Objective: Vitals:   10/24/20 2018 10/25/20 0550  BP: (!) 133/95 (!) 151/95  Pulse: 85 87  Resp: 17 19  Temp: 98.4 F (36.9 C) 97.7 F (36.5 C)  SpO2: 99% 100%    Intake/Output Summary (Last 24 hours) at 10/25/2020 0742 Last data filed at 10/25/2020 0600 Gross per 24 hour  Intake 1628 ml  Output 1100 ml  Net 528 ml   Filed Weights   10/23/20 0506 10/24/20 0549 10/25/20 0500  Weight: 83.4 kg 84.3 kg 84.2 kg    Exam: Constitutional: Awake, calm, no acute distress Heart: Heart sounds S1-S2, no tachycardia, no peripheral edema, skin warm and dry Respiratory: Posterior lung sounds clear to auscultation, stable on  room air Abdomen: D2 diet with variable oral intake.  Abdomen soft nontender.  PEG tube in place for supplemental bolus feedings and medications.  LBM 3/8 Neurologic: CN 2-12 roughly without any appreciable focal neurological deficits, MOE x4, strength is 5/5.   Psychiatric: Oriented times name only.  Has minimal verbal response but will nod when asked questions.  Pleasant. Skin: Previous areas of flat white lesions have essentially resolved except for a small cluster in the mid back and these  lesions are dry and resolving   Assessment/Plan: Acute problems: Out of hospital cardiac arrest secondary to STEMI -ROSC and subsequent cardiac catheterization revealed dz in circumflex requiring DES -Continue beta-blocker, statin, Plavix, isosorbide and hydralazine  Significant anoxic brain injury secondary to cardiac arrest/deconditioning/gait instability -Consistently participating with PT and OT.  SLP has cleared for dysphagia 2 with thin liquids -Declined by CIR since family unable to provide 24/7 care.  Plan is for SNF -Continue Seroquel 100 mg at at bedtime -2/18 add Seroquel 25 mg every 12 hours as needed for agitation and wandering -Continue daytime Symmetrel  **PT NOTES FROM 3/8**  Patient declined, no reason specified pt asleep upon arrival; attempted to coax/motivate pt to participate in therapy services but pt not responding or interacting with therapist this date. Not responding to questions. Shaking head no a few times. Will follow.  SLP notes 3/8: Pt was seen for ongoing dysphagia and cognitive-linguistic tx. When given binary choices and cues for initiation, pt verbalized at the word level to make food and drink selections. He also engaged in self-feeding when given consistent cueing for initiation. Oral holding was noted with purees, but he responded well to use of liquid washes to clear his mouth. Pt participated in a verbal description task, also needing binary choices to complete. He was not able to produce any words in a simple divergent naming task; however, he did attempt reading one-word responses written by SLP. He read the first word aloud and demonstrated perseverative errors on the remaining words, alleviated when SLP provided phonemic cues. Pt will continue to benefit from ongoing SLP services.   OT notes 3/8: Pt received supine in bed upon arrival initially declining OT session but agreeable with encouragement. Pt continues to present with impaired balance,  cognitive deficits and decreased activity tolerance. Pt sit<>stands from EOB with MOD A, pt able to take steps to door with MIN A but then impulsively decided to sit into recliner. Pt currently requires MAX A for LB ADLs and up to MOD A for functional mobility. Pt noted to verbalize more this session stating the names of his fiance and sisters. Pt would continue to benefit from skilled occupational therapy while admitted and after d/c to address the below listed limitations in order to improve overall functional mobility and facilitate independence with BADL participation. DC plan remains appropriate, will follow acutely per POC.   White lesions/rash on back and buttocks -Appeared after patient had laying in tube feeding    10/10/20                           10/10/20     10/16/2020     10/20/2020                                     10/20/2020 -As of 3/4 wounds not covered by dressing are drying up and are clearing.  Have opted to remove wounds covered by dressing to allow to dry up and clear as well.  Situational depression -Started Celexa 2/16-increased to 20 mg 2/18  Ischemic cardiomyopathy -Echo this admission EF 40% -Continue hydralazine and Isordil -No ACE inhibitor secondary to underlying kidney disease -Will need follow-up echo 3 months post initial echo which was completed on 07/23/2020-will obtain ECHO on 3/4  Hypertension -Was on Norvasc prior to admission -Continue cardiovascular meds as listed above  Acute on chronic kidney disease stage IIIb -Suspect prerenal etiology in the setting of hypoperfusion from ventricular fibrillation arrest -Baseline creatinine between 1.6 and 2; creatinine on 12/19 was 1.67-will repeat in a.m. on 08/19/2020 -Continue free water per tube and intermittently follow labs  Dysphagia, moderate protein calorie malnutrition Nutrition Problem: Inadequate oral intake Etiology: inability to eat Signs/Symptoms: NPO status  -Continue D2 diet noting patient with  improved oral intake.  Will discontinue nocturnal tube feedings for now continue to follow -Add Ensure beverage twice daily between meals. Interventions: Tube feeding,Prostat Estimated body mass index is 25.18 kg/m as calculated from the following:   Height as of this encounter: 6' (1.829 m).   Weight as of this encounter: 84.2 kg. -3/2 have changed all per tube meds to oral. If patient able to maintain adequate oral intake of both medications food and fluids will consider removing PEG tube to allow patient to discharge to a nonskilled nursing facility if necessary    Other problems: Acute hypernatremia -Continue free water per tube  E. coli UTI -Has completed 5 days of Bactrim  Hyperglycemia without an underlying diagnosis of diabetes mellitus Resolved -Likely related to acute stress -Recent issues with hypoglycemia so sliding scale insulin decreased to very sensitive and Levemir decreased from 30 twice daily to 15 twice daily as of 1/14 -Hemoglobin A1c 4.7   Acute hypoxemic respiratory failure -Resolved -Stable on room air with O2 sats between 97 and 99%  Nonsustained VT -Continue amiodarone and beta-blocker -1/31 LFTs normal  Skin tear right lateral back Wound / Incision (Open or Dehisced) 07/28/20 Skin tear Back Lateral;Right;Upper (Active)  Date First Assessed/Time First Assessed: 07/28/20 0300   Wound Type: Skin tear  Location: Back  Location Orientation: Lateral;Right;Upper  Present on Admission: No    Assessments 07/28/2020  7:49 PM 10/24/2020  9:00 AM  Dressing Type None None  Drainage Amount None --  Treatment Cleansed --     No Linked orders to display    Data Reviewed: Basic Metabolic Panel: Recent Labs  Lab 10/21/20 0427 10/24/20 0423  NA 137 139  K 4.3 4.5  CL 103 102  CO2 23 29  GLUCOSE 98 103*  BUN 15 17  CREATININE 1.21 1.30*  CALCIUM 9.3 9.8   Liver Function Tests: Recent Labs  Lab 10/21/20 0427 10/24/20 0423  AST 32 28  ALT 70* 61*   ALKPHOS 72 71  BILITOT 0.8 0.7  PROT 7.3 7.4  ALBUMIN 3.2* 3.1*   No results for input(s): LIPASE, AMYLASE in the last 168 hours. No results for input(s): AMMONIA in the last 168 hours. CBC: Recent Labs  Lab 10/21/20 0427  WBC 8.8  HGB 12.6*  HCT 38.8*  MCV 96.0  PLT 216   Cardiac Enzymes: No results for input(s): CKTOTAL, CKMB, CKMBINDEX, TROPONINI in the last 168 hours. BNP (last 3 results) No results for input(s): BNP in the last 8760 hours.  ProBNP (last 3 results) No results for input(s): PROBNP in the last 8760 hours.  CBG: Recent Labs  Lab 10/24/20 0041 10/24/20 0547 10/24/20 1726 10/25/20 0003 10/25/20 0603  GLUCAP 164* 117* 125* 141* 110*    No results found for this or any previous visit (from the past 240 hour(s)).   Studies: No results found.  Scheduled Meds: . amantadine  100 mg Oral Daily  . aspirin  81 mg Oral Daily  . atorvastatin  80 mg Oral Daily  . carvedilol  25 mg Oral BID WC  . chlorhexidine gluconate (MEDLINE KIT)  15 mL Mouth Rinse BID  . citalopram  20 mg Oral Daily  . clopidogrel  75 mg Oral Daily  . enoxaparin (LOVENOX) injection  40 mg Subcutaneous Q24H  . feeding supplement  237 mL Oral BID BM  . feeding supplement (JEVITY 1.2 CAL)  237 mL Per Tube QID  . Gerhardt's butt cream   Topical TID  . hydrALAZINE  25 mg Oral Q8H  . insulin aspart  0-15 Units Subcutaneous Q6H  . isosorbide dinitrate  10 mg Oral TID  . mouth rinse  15 mL Mouth Rinse BID  . polyvinyl alcohol  1 drop Both Eyes QID  . QUEtiapine  25 mg Oral Daily  . sodium chloride flush  3 mL Intravenous Q12H   Continuous Infusions:   Active Problems:   Acute ST elevation myocardial infarction (STEMI) [facility])   Cardiac arrest (HCC)   Elevated blood pressure reading with diagnosis of hypertension   Shock circulatory (HCC)   Encephalopathy   History of ETT   Cardiomyopathy, ischemic   Anoxic brain damage ([place])   Dysphagia   DNR (do not resuscitate) discussion    Gait instability   E. coli UTI   Consultants:  Cardiology  Interventional radiology  PCCM  Neurology  Procedures:  Cardiac catheterization 12/4  Continuous EEG 12/4 and overnight EEG with video 12/5  Echocardiogram 12/5  EEG 12/9  Percutaneous PEG tube placement 12/30  Antibiotics: Anti-infectives (From admission, onward)   Start     Dose/Rate Route Frequency Ordered Stop   10/14/20 1000  fluconazole (DIFLUCAN) tablet 100 mg        100 mg Oral Daily 10/14/20 0848 10/18/20 0932   09/13/20 1000  sulfamethoxazole-trimethoprim (BACTRIM) 200-40 MG/5ML suspension 20 mL        20 mL Per Tube Every 12 hours 09/13/20 0734 09/19/20 0926   09/13/20 0745  cefTRIAXone (ROCEPHIN) 1 g in sodium chloride 0.9 % 100 mL IVPB  Status:  Discontinued        1 g 200 mL/hr over 30 Minutes Intravenous Every 24 hours 09/13/20 0658 09/13/20 0734   08/17/20 0000  ceFAZolin (ANCEF) IVPB 2g/100 mL premix        2 g 200 mL/hr over 30 Minutes Intravenous To Radiology 08/15/20 1346 08/17/20 1111   07/22/20 0830  cefTRIAXone (ROCEPHIN) 2 g in sodium chloride 0.9 % 100 mL IVPB        2 g 200 mL/hr over 30 Minutes Intravenous Every 24 hours 07/22/20 0720 07/26/20 0858       Time spent: 20 minutes    Erin Hearing ANP  [facility] 7 am - 330 pm/M-F for direct patient care and secure chat Please refer to Amion for contact information 95 days

## 2020-10-25 NOTE — Progress Notes (Signed)
Patient was seen and examined. Agree with assessment and plan as per Ms. Erin Hearing. Briefly, pt experienced out of hospital cardiac arrest with eventual ROSC after defibrillation for vfib and multiple doses of epi. Pt remains with anoxic brain injury, otherwise stable. Pending disposition

## 2020-10-25 NOTE — Progress Notes (Addendum)
Physical Therapy Treatment Patient Details Name: Antonio Navarro MRN: [id number] DOB: Aug 08, 1961 Today's Date: 10/25/2020    History of Present Illness [name] admitted 07/22/20 post cardiac arrest with inferolateral STEMI. He was resuscitated in the field by EMS after being found to be in V fib. Approx 30 minutes of CPR. He was intubated on arrival.  Pt with anoxic brain injury. PEG placed on 08/17/20. PMH: tobacco abuse, obesity, HTN.    PT Comments    Pt received in chair, agreeable to therapy session and with good participation and tolerance for mobility, significant other present during session and encouraging. Pt continues to require minA for transfers and household distance ambulation task, pt at times with short/shallow breathing during mobility and orthostatics checked, SBP drop from 160/106 seated to 131/87 standing although pt denies dizziness/LE weakness may not be able to comprehend question due to ABI; VSS otherwise on RA. He continues to make good progress toward goals as updated today by supervising PT Chrys Racer B. Pt continues to benefit from PT services to progress toward functional mobility goals. Continue to recommend SNF.  Orthostatic BPs  Sitting taken in RUE 160/106 (124)   Standing 131/87 (102)  Sitting after standing 143/107 (116)    Follow Up Recommendations  SNF;Supervision/Assistance - 24 hour     Equipment Recommendations  Rolling walker with 5" wheels;3in1 (PT);Wheelchair (measurements PT);Wheelchair cushion (measurements PT)    Recommendations for Other Services       Precautions / Restrictions Precautions Precautions: Fall Precaution Comments: g tube Restrictions Weight Bearing Restrictions: No    Mobility  Bed Mobility Overal bed mobility: Needs Assistance         Sit to supine: Supervision   General bed mobility comments: pt motivated to return to supine, supervision for blankets/safety cues; also able to perform supine posterior scoot  toward HOB with supervision/cues and overhead rail    Transfers Overall transfer level: Needs assistance Equipment used: Rolling walker (2 wheeled) Transfers: Sit to/from Stand Sit to Stand: Min assist;+2 safety/equipment         General transfer comment: from EOB and chair heights to RW, successful on 2/3 attempts, cues needed for hand placement  Ambulation/Gait Ambulation/Gait assistance: Min assist Gait Distance (Feet): 60 Feet Assistive device: Rolling walker (2 wheeled) Gait Pattern/deviations: Narrow base of support;Decreased stride length;Step-through pattern;Trunk flexed;Staggering right Gait velocity: grossly <0.4 m/s   General Gait Details: slow, unsteady gait with narrow BOS, pt with quick/shallow breathing and fair use of RW; upon sitting checked orthostatics and pt SBP dropped 30 points with sit>stand however he denies dizziness; VSS on RA   Stairs             Wheelchair Mobility    Modified Rankin (Stroke Patients Only)       Balance Overall balance assessment: Needs assistance Sitting-balance support: Feet supported;No upper extremity supported Sitting balance-Leahy Scale: Good Sitting balance - Comments: static sitting and weight shifting no LOB   Standing balance support: During functional activity;Bilateral upper extremity supported Standing balance-Leahy Scale: Poor Standing balance comment: reliant on BUE support of RW, min guard to minA for static/dynamic standing tasks                            Cognition Arousal/Alertness: Awake/alert Behavior During Therapy: Flat affect Overall Cognitive Status: Impaired/Different from baseline Area of Impairment: Attention;Following commands;Problem solving;Awareness;Safety/judgement;Memory;Orientation               [place]  Levels of Cognitive Functioning [place] BuildDNA.es Scales of Cognitive Functioning: Confused/appropriate Orientation Level: Disoriented  to;Place;Time;Situation Current Attention Level: Sustained Memory: Decreased short-term memory Following Commands: Follows multi-step commands inconsistently;Follows one step commands with increased time;Follows multi-step commands with increased time;Follows one step commands consistently Safety/Judgement: Decreased awareness of safety Awareness: Intellectual Problem Solving: Slow processing;Difficulty sequencing;Decreased initiation;Requires verbal cues;Requires tactile cues General Comments: pt calm and cooperative, able to follow some 2-step commands (counts out loud during seated exercises) but will stop prior to reaching recommended number of repetitions unless prompted to continue; he denies dizziness however SBP dropped 30 points from sitting to standing      Exercises General Exercises - Lower Extremity Long Arc Quad: AROM;Both;15 reps;Seated (pt able to count out loud the reps with encouragement) Hip Flexion/Marching: AROM;Both;15 reps;Seated Other Exercises Other Exercises: shoulder shrugs x15 reps seated, pt able to count at loud some of the reps    General Comments General comments (skin integrity, edema, etc.): SpO2 96% on RA, HR 70's bpm seated/standing      Pertinent Vitals/Pain Pain Assessment: Faces Faces Pain Scale: No hurt Pain Location: denies pain, no grimacing, possible bottom soreness from sitting in chair too long? Pain Descriptors / Indicators:  (wiggling around in chair when staff entered room) Pain Intervention(s): Monitored during session;Repositioned    Home Living                      Prior Function            PT Goals (current goals can now be found in the care plan section) Acute Rehab PT Goals Patient Stated Goal: to get better PT Goal Formulation: With patient Time For Goal Achievement: 11/08/20 Potential to Achieve Goals: Fair Progress towards PT goals: Progressing toward goals    Frequency    Min 3X/week      PT Plan  Current plan remains appropriate    Co-evaluation              AM-PAC PT "6 Clicks" Mobility   Outcome Measure  Help needed turning from your back to your side while in a flat bed without using bedrails?: None Help needed moving from lying on your back to sitting on the side of a flat bed without using bedrails?: A Little Help needed moving to and from a bed to a chair (including a wheelchair)?: A Little Help needed standing up from a chair using your arms (e.g., wheelchair or bedside chair)?: A Little Help needed to walk in hospital room?: A Little Help needed climbing 3-5 steps with a railing? : A Lot 6 Click Score: 18    End of Session Equipment Utilized During Treatment: Gait belt Activity Tolerance: Patient tolerated treatment well;Other (comment) (orthostatic hypotension) Patient left: in bed;with call bell/phone within reach;with bed alarm set;with family/visitor present Nurse Communication: Mobility status PT Visit Diagnosis: Other abnormalities of gait and mobility (R26.89);Difficulty in walking, not elsewhere classified (R26.2)     Time: 7915-0569 PT Time Calculation (min) (ACUTE ONLY): 21 min  Charges:  $Therapeutic Exercise: 8-22 mins                     Curties Conigliaro P., PTA Acute Rehabilitation Services Pager: [phone] Office: [place] 10/25/2020, 5:14 PM

## 2020-10-26 LAB — GLUCOSE, CAPILLARY
Glucose-Capillary: 109 mg/dL — ABNORMAL HIGH (ref 70–99)
Glucose-Capillary: 131 mg/dL — ABNORMAL HIGH (ref 70–99)
Glucose-Capillary: 156 mg/dL — ABNORMAL HIGH (ref 70–99)

## 2020-10-26 MED ORDER — PROSOURCE PLUS PO LIQD
30.0000 mL | Freq: Three times a day (TID) | ORAL | Status: DC
  Administered 2020-10-26 – 2020-11-05 (×32): 30 mL via ORAL
  Administered 2020-11-06: 15 mL via ORAL
  Administered 2020-11-06 – 2020-11-08 (×4): 30 mL via ORAL
  Filled 2020-10-26 (×38): qty 30

## 2020-10-26 NOTE — Progress Notes (Signed)
[facility] PROGRESS NOTE  OSEIAS HORSEY QMG:[id number] DOB: 07-12-1961 DOA: 07/22/2020 PCP: Patient, No Pcp Per       Status: Remains inpatient appropriate because:Altered mental status and Unsafe d/c plan   Dispo: The patient is from: Home              Anticipated d/c is to: SNF              Anticipated d/c date is: > 3 days              Patient currently is medically stable to d/c.  Barriers to discharge: Medicaid and disability pending. No SNF bed offers.  No further nocturnal agitation or wandering behavior so hopeful will be eligible for SNF bed offer.  Patient able to ambulate 300 feet so hopefully he can eventually discharge home with family as long as they can provide appropriate 24/7 monitoring.  On 2/28 TOC contacted at [facility]   Code Status: Full Family Communication: Updated Sister [name] (who works as a Recruitment consultant from 6 AM to 11 AM).  She is aware that major restriction regarding placement has to do with Medicaid application still pending and many facilities are reluctant to accept a long-term patient without Medicaid in place. DVT prophylaxis: sq heparin Vaccination status: Completed both doses of Pfizer Covid vaccination on 12/14/2019; [place] booster ordered to be given on 3/3 Foley catheter: No  HPI: 60 year old male history of hypertension prescribed Norvasc prior to admission, chronic back pain, prior renal calculi who experienced an out of hospital cardiac arrest with downtime of 9 minutes.  Initial rhythm was ventricular fibrillation.  He was defibrillated deceived 6 doses of epi before ROSC.  Subsequent work-up revealed STEMI.  He underwent cardiac catheterization with a DES placed to the circumflex.  Cardiology following and has placed patient on Brilinta and aspirin and is also being treated with beta-blocker and statins.  From a neurological standpoint he clinically has anoxic brain injury.  During this hospitalization he experienced  AKI and was treated with fluids and correction of hypoperfusion from cardiac arrest.  Echo performed this admission revealed ischemic cardiomyopathy with an EF of 40%  In addition to above patient has been unable to pass swallowing evaluations and subsequently has undergone percutaneous PEG tube placement on 12/30.  Patient is awake and moving all extremities spontaneously but does not follow commands and does not verbalize palliative care has met with patient and family and has emphasized poor prognosis and will continue to meet with the family during the hospitalization.  Current plan is to transition to SNF bed once available.  Expect need for lifelong 24/7 care for all ADLs.  Subjective: Awakened.  Mr. Peace is not a morning person.  Nursing staff opening blinds behind him and he started pulling the covers up over his head.  I started joking with him that the light was going to injure him like a vampire and he started laughing.  Objective: Vitals:   10/25/20 1945 10/26/20 0545  BP: (!) 142/87 108/77  Pulse: 84 80  Resp: 18 18  Temp: 97.6 F (36.4 C) 97.7 F (36.5 C)  SpO2: 99% 100%    Intake/Output Summary (Last 24 hours) at 10/26/2020 0738 Last data filed at 10/26/2020 0500 Gross per 24 hour  Intake 1144 ml  Output 750 ml  Net 394 ml   Filed Weights   10/25/20 0500 10/26/20 0058 10/26/20 0500  Weight: 84.2 kg 84.3 kg 84.3 kg    Exam:  Constitutional: Awaken, calm, no acute distress Heart: Heart sounds S1-S2, pulse regular, no peripheral edema Respiratory: Lung sounds are clear and he is stable on room air Abdomen: D2 diet  PEG tube in place for bolus feedings and medications LBM 3/9 Neurologic: CN 2-12 roughly without any appreciable focal neurological deficits, MOE x4, strength is 5/5.   Psychiatric: Awakens.  Oriented times name.  Appropriate affect. Skin: Previous areas of flat white lesions have essentially resolved except for a small cluster in the mid back and these  lesions are dry and resolving   Assessment/Plan: Acute problems: Out of hospital cardiac arrest secondary to STEMI -ROSC and subsequent cardiac catheterization revealed dz in circumflex requiring DES -Continue beta-blocker, statin, Plavix, isosorbide and hydralazine  Significant anoxic brain injury secondary to cardiac arrest/deconditioning/gait instability -Consistently participating with PT and OT.  SLP has cleared for dysphagia 2 with thin liquids -Declined by CIR since family unable to provide 24/7 care.  Plan is for SNF -Continue Seroquel 100 mg at at bedtime -2/18 add Seroquel 25 mg every 12 hours as needed for agitation and wandering -Continue daytime Symmetrel  **PT NOTES FROM 3/8**  Patient declined, no reason specified pt asleep upon arrival; attempted to coax/motivate pt to participate in therapy services but pt not responding or interacting with therapist this date. Not responding to questions. Shaking head no a few times. Will follow.  SLP notes 3/8: Pt was seen for ongoing dysphagia and cognitive-linguistic tx. When given binary choices and cues for initiation, pt verbalized at the word level to make food and drink selections. He also engaged in self-feeding when given consistent cueing for initiation. Oral holding was noted with purees, but he responded well to use of liquid washes to clear his mouth. Pt participated in a verbal description task, also needing binary choices to complete. He was not able to produce any words in a simple divergent naming task; however, he did attempt reading one-word responses written by SLP. He read the first word aloud and demonstrated perseverative errors on the remaining words, alleviated when SLP provided phonemic cues. Pt will continue to benefit from ongoing SLP services.   OT notes 3/8: Pt received supine in bed upon arrival initially declining OT session but agreeable with encouragement. Pt continues to present with impaired balance,  cognitive deficits and decreased activity tolerance. Pt sit<>stands from EOB with MOD A, pt able to take steps to door with MIN A but then impulsively decided to sit into recliner. Pt currently requires MAX A for LB ADLs and up to MOD A for functional mobility. Pt noted to verbalize more this session stating the names of his fiance and sisters. Pt would continue to benefit from skilled occupational therapy while admitted and after d/c to address the below listed limitations in order to improve overall functional mobility and facilitate independence with BADL participation. DC plan remains appropriate, will follow acutely per POC.   White lesions/rash on back and buttocks -Appeared after patient had laying in tube feeding    10/10/20                           10/10/20     10/16/2020     10/20/2020                                     10/20/2020 -As of 3/4 wounds not covered by dressing  are drying up and are clearing.  Have opted to remove wounds covered by dressing to allow to dry up and clear as well.  Situational depression -Started Celexa 2/16-increased to 20 mg 2/18  Ischemic cardiomyopathy -Echo this admission EF 40% -Continue hydralazine and Isordil -No ACE inhibitor secondary to underlying kidney disease -Follow-up echocardiogram done March 2022 demonstrates an EF of 40 to 45% with LVH and grade 1 diastolic dysfunction physiology  Hypertension -Was on Norvasc prior to admission -Continue cardiovascular meds as listed above  Acute on chronic kidney disease stage IIIb -Suspect prerenal etiology in the setting of hypoperfusion from ventricular fibrillation arrest -Baseline creatinine between 1.6 and 2; creatinine on 12/19 was 1.67-will repeat in a.m. on 08/19/2020 -Continue free water per tube and intermittently follow labs  Dysphagia, moderate protein calorie malnutrition Nutrition Problem: Inadequate oral intake Etiology: inability to eat Signs/Symptoms: NPO status  -Continue D2 diet  noting patient with improved oral intake.  Will discontinue nocturnal tube feedings for now continue to follow -Add Ensure beverage twice daily between meals. Interventions: Tube feeding,Prostat Estimated body mass index is 25.21 kg/m as calculated from the following:   Height as of this encounter: 6' (1.829 m).   Weight as of this encounter: 84.3 kg. -3/10 discussed with nutritional team patient has demonstrated improved intake of oral food and fluids.  Previously ordered bolus tube feedings have been discontinued.  We will continue Ensure as above    Other problems: Acute hypernatremia -Continue free water per tube  E. coli UTI -Has completed 5 days of Bactrim  Hyperglycemia without an underlying diagnosis of diabetes mellitus Resolved -Likely related to acute stress -Recent issues with hypoglycemia so sliding scale insulin decreased to very sensitive and Levemir decreased from 30 twice daily to 15 twice daily as of 1/14 -Hemoglobin A1c 4.7   Acute hypoxemic respiratory failure -Resolved -Stable on room air with O2 sats between 97 and 99%  Nonsustained VT -Continue amiodarone and beta-blocker -1/31 LFTs normal  Skin tear right lateral back Wound / Incision (Open or Dehisced) 07/28/20 Skin tear Back Lateral;Right;Upper (Active)  Date First Assessed/Time First Assessed: 07/28/20 0300   Wound Type: Skin tear  Location: Back  Location Orientation: Lateral;Right;Upper  Present on Admission: No    Assessments 07/28/2020  7:49 PM 10/25/2020 10:00 AM  Dressing Type None -  Wound Length (cm) - 0 cm  Wound Width (cm) - 0 cm  Wound Depth (cm) - 0 cm  Wound Volume (cm^3) - 0 cm^3  Wound Surface Area (cm^2) - 0 cm^2  Drainage Amount None -  Treatment Cleansed -     No Linked orders to display    Data Reviewed: Basic Metabolic Panel: Recent Labs  Lab 10/21/20 0427 10/24/20 0423  NA 137 139  K 4.3 4.5  CL 103 102  CO2 23 29  GLUCOSE 98 103*  BUN 15 17  CREATININE 1.21  1.30*  CALCIUM 9.3 9.8   Liver Function Tests: Recent Labs  Lab 10/21/20 0427 10/24/20 0423  AST 32 28  ALT 70* 61*  ALKPHOS 72 71  BILITOT 0.8 0.7  PROT 7.3 7.4  ALBUMIN 3.2* 3.1*   No results for input(s): LIPASE, AMYLASE in the last 168 hours. No results for input(s): AMMONIA in the last 168 hours. CBC: Recent Labs  Lab 10/21/20 0427  WBC 8.8  HGB 12.6*  HCT 38.8*  MCV 96.0  PLT 216   Cardiac Enzymes: No results for input(s): CKTOTAL, CKMB, CKMBINDEX, TROPONINI in the last 168  hours. BNP (last 3 results) No results for input(s): BNP in the last 8760 hours.  ProBNP (last 3 results) No results for input(s): PROBNP in the last 8760 hours.  CBG: Recent Labs  Lab 10/25/20 0603 10/25/20 1204 10/25/20 1801 10/25/20 2359 10/26/20 0603  GLUCAP 110* 157* 164* 131* 109*    No results found for this or any previous visit (from the past 240 hour(s)).   Studies: No results found.  Scheduled Meds: . amantadine  100 mg Oral Daily  . aspirin  81 mg Oral Daily  . atorvastatin  80 mg Oral Daily  . carvedilol  25 mg Oral BID WC  . chlorhexidine gluconate (MEDLINE KIT)  15 mL Mouth Rinse BID  . citalopram  20 mg Oral Daily  . clopidogrel  75 mg Oral Daily  . enoxaparin (LOVENOX) injection  40 mg Subcutaneous Q24H  . feeding supplement  237 mL Oral BID BM  . feeding supplement (JEVITY 1.2 CAL)  237 mL Per Tube QID  . Gerhardt's butt cream   Topical TID  . hydrALAZINE  25 mg Oral Q8H  . insulin aspart  0-15 Units Subcutaneous Q6H  . isosorbide dinitrate  10 mg Oral TID  . mouth rinse  15 mL Mouth Rinse BID  . polyvinyl alcohol  1 drop Both Eyes QID  . QUEtiapine  25 mg Oral Daily  . sodium chloride flush  3 mL Intravenous Q12H   Continuous Infusions:   Active Problems:   Acute ST elevation myocardial infarction (STEMI) [facility])   Cardiac arrest (HCC)   Elevated blood pressure reading with diagnosis of hypertension   Shock circulatory (HCC)   Encephalopathy    History of ETT   Cardiomyopathy, ischemic   Anoxic brain damage ([place])   Dysphagia   DNR (do not resuscitate) discussion   Gait instability   E. coli UTI   Consultants:  Cardiology  Interventional radiology  PCCM  Neurology  Procedures:  Cardiac catheterization 12/4  Continuous EEG 12/4 and overnight EEG with video 12/5  Echocardiogram 12/5  EEG 12/9  Percutaneous PEG tube placement 12/30  Antibiotics: Anti-infectives (From admission, onward)   Start     Dose/Rate Route Frequency Ordered Stop   10/14/20 1000  fluconazole (DIFLUCAN) tablet 100 mg        100 mg Oral Daily 10/14/20 0848 10/18/20 0932   09/13/20 1000  sulfamethoxazole-trimethoprim (BACTRIM) 200-40 MG/5ML suspension 20 mL        20 mL Per Tube Every 12 hours 09/13/20 0734 09/19/20 0926   09/13/20 0745  cefTRIAXone (ROCEPHIN) 1 g in sodium chloride 0.9 % 100 mL IVPB  Status:  Discontinued        1 g 200 mL/hr over 30 Minutes Intravenous Every 24 hours 09/13/20 0658 09/13/20 0734   08/17/20 0000  ceFAZolin (ANCEF) IVPB 2g/100 mL premix        2 g 200 mL/hr over 30 Minutes Intravenous To Radiology 08/15/20 1346 08/17/20 1111   07/22/20 0830  cefTRIAXone (ROCEPHIN) 2 g in sodium chloride 0.9 % 100 mL IVPB        2 g 200 mL/hr over 30 Minutes Intravenous Every 24 hours 07/22/20 0720 07/26/20 0858       Time spent: 20 minutes    Erin Hearing ANP  [facility] 7 am - 330 pm/M-F for direct patient care and secure chat Please refer to Amion for contact information 96 days

## 2020-10-26 NOTE — TOC Progression Note (Signed)
Transition of Care [facility]) - Progression Note    Patient Details  Name: Antonio Navarro MRN: [id number] Date of Birth: Oct 28, 1960  Transition of [facility] CM/SW Contact  Curlene Labrum, RN Phone Number: 10/26/2020, 1:14 PM  Clinical Narrative:    Case management spoke with [name] and they are unable to offer an admission bed at this point through an [facility].  The patient has Medicaid pending only at this point and is waiting approval.  The patient continues to need a SNF facility that will accept an LOG for [facility] admission.  I called and spoke with [name], CM at [facility] and the facility is now open to admissions and has a Memory care unit.  The facility was willing to review the patient's clinicals and clinicals were faxed to [place], [place] at [phone].  CM and MSW will continue to follow the patient for SNF admission.   Expected Discharge Plan: Skilled Nursing Facility Barriers to Discharge: Continued Medical Work up,No SNF bed  Expected Discharge Plan and Services Expected Discharge Plan: [place] arrangements for the past 2 months: Single Family Home                                       Social Determinants of Health (SDOH) Interventions    Readmission Risk Interventions No flowsheet data found.

## 2020-10-26 NOTE — Progress Notes (Signed)
Nutrition Follow-up  DOCUMENTATION CODES:   Not applicable  INTERVENTION:   -ContinueEnsure Enlive po BID, each supplement provides 350 kcal and 20 grams of protein -ContinueMagic cup TID with meals, each supplement provides 290 kcal and 9 grams of protein -30 ml Prosource Plus TID, each supplement provides 100 kcals and 15 grams protein -Continue feeding assistance with meals -D/c TF per discussion with Erin Hearing, NP  NUTRITION DIAGNOSIS:   Inadequate oral intake related to inability to eat as evidenced by NPO status.  Ongoing; advanced to a dysphagia 2 diet on 08/29/20  GOAL:   Patient will meet greater than or equal to 90% of their needs  Progressing   MONITOR:   Diet advancement,Labs,Weight trends,TF tolerance,Skin,I & O's  REASON FOR ASSESSMENT:   Consult,Ventilator Enteral/tube feeding initiation and management  ASSESSMENT:   Patient with PMH significant for HTN and kidney stones. Presents this admission s/p cardiac arrest.  12/13- NGT d/c 12/15- cortrak placed, tip of tube in the stomach 12/17- s/p BSE- recommend continue NPO 12/19- pt pulled cortrak tube, replaced- placement verified by x-ray (stomach) 12/20- s/p BSE- pt refusing PO trials 12/30- cortrak removed, PEG placed 1/5- Advanced to thin liquid diet 1/6- Advanced to full liquid diet 1/11- Advanced to dysphagia 2 diet 1/14- transitioned to nocturnal feedings by MD 1/18- TF d/c by MD 1/24- calorie count completed- pt consuming about 50% of needs PO; nocturnal feedings resumed  Reviewed I/O's: +394 ml x 24 hours and -5.5 L since 10/12/20  UOP: 750 ml x 24 hours   Case discussed with Erin Hearing, NP. Reviewed meal intake over the past 3 days; intake has improved pt and pt meeting 75% of needs via PO route on average. NP desires to d/c TF at this time, but request RD maximize supplement intake. Per NP, pt has never been a "breakfast person", but consistently eats well at lunch and dinner with  assistance.   3/7 Breakfast: 337 kcals, 14 grams protein Lunch: 790 kcals, 40 grams protein Dinner: nothing documented Supplements: 1 Ensure Enlive supplement (350 kcals, 20 grams protein)  Total intake: 1477 kcal (64% of minimum estimated needs)  74 grams protein (64% of minimum estimated needs)  3/8 Breakfast: 92 kcals, 3 grams protein Lunch: nothing documented Dinner: 872 kcals, 40 grams protein Supplements: 2 Ensure Enlive supplements (700 kcals, 40 grams protein)  Total intake: 1664 kcal (72% of minimum estimated needs)  83 grams protein (72% of minimum estimated needs)  3/9 Breakfast: nothing documented Lunch: 555 kcals, 27 grams protein Dinner: 862 kcals, 32 grams protein Supplements: 2 Ensure Enlive supplements (700 kcals, 40 grams protein)  Total intake: 2117 kcal (92% of minimum estimated needs)  99 grams protein (86% of minimum estimated needs)  Average Total intake: 1752 kcal (76% of minimum estimated needs)  85 grams protein (75% of minimum estimated needs)  Labs reviewed: CBGS: 109-157 (inpatient orders for glycemic control are none).   Diet Order:   Diet Order            DIET DYS 2 Room service appropriate? No; Fluid consistency: Thin  Diet effective now                 EDUCATION NEEDS:   Not appropriate for education at this time  Skin:  Skin Assessment: Skin Integrity Issues: Skin Integrity Issues:: Other (Comment) Other: MASD to buttocks, skin tear to rt upper back  Last BM:  10/23/20  Height:   Ht Readings from Last 1 Encounters:  09/03/20 6' (  1.829 m)    Weight:   Wt Readings from Last 1 Encounters:  10/26/20 84.3 kg    Ideal Body Weight:  80.9 kg  BMI:  Body mass index is 25.21 kg/m.  Estimated Nutritional Needs:   Kcal:  2300-2500 kcal  Protein:  115-130 grams  Fluid:  >/= 2 L/day    Loistine Chance, RD, LDN, [place] Registered Dietitian II Certified Diabetes Care and Education Specialist Please refer to [facility] for RD  and/or RD on-call/weekend/after hours pager

## 2020-10-26 NOTE — Progress Notes (Signed)
  Speech Language Pathology Treatment: Dysphagia  Patient Details Name: Antonio Navarro MRN: [id number] DOB: Jan 04, 1961 Today's Date: 10/26/2020 Time: 2449-7530 SLP Time Calculation (min) (ACUTE ONLY): 12 min  Assessment / Plan / Recommendation Clinical Impression  Pt was asleep upon entering the room but became more alert with repositioning and multimodal cues. No overt s/s of aspiration were observed with limited PO trials of thin liquids. SLP attempted trials of solids and more trials of thin liquids, but pt shook his head no despite Max encouragement. Recommend continuation of his current diet and SLP will continue to follow.    HPI HPI: Pt is a 60 yo male s/p arrest with downtime 9 minutes. Initial rhythm was V. fib, shocked and received 6 rounds of epi before ROSC achieved.  Total CPR time was 30 minutes. ETT 12/4-12/13. MRI 12/7 suggestive of a widespread anoxic injury. PMH includes kidney stones and HTN.      SLP Plan  Continue with current plan of care       Recommendations  Diet recommendations: Dysphagia 2 (fine chop);Thin liquid Liquids provided via: Cup;Straw Medication Administration: Crushed with puree Supervision: Staff to assist with self feeding;Full supervision/cueing for compensatory strategies Compensations: Slow rate;Small sips/bites;Minimize environmental distractions Postural Changes and/or Swallow Maneuvers: Seated upright 90 degrees                Oral Care Recommendations: Oral care BID Follow up Recommendations: Skilled Nursing facility SLP Visit Diagnosis: Dysphagia, unspecified (R13.10);Cognitive communication deficit (R41.841) Plan: Continue with current plan of care       GO                Jeanine Luz., SLP Student 10/26/2020, 12:02 PM

## 2020-10-27 LAB — GLUCOSE, CAPILLARY
Glucose-Capillary: 111 mg/dL — ABNORMAL HIGH (ref 70–99)
Glucose-Capillary: 119 mg/dL — ABNORMAL HIGH (ref 70–99)
Glucose-Capillary: 133 mg/dL — ABNORMAL HIGH (ref 70–99)
Glucose-Capillary: 139 mg/dL — ABNORMAL HIGH (ref 70–99)
Glucose-Capillary: 158 mg/dL — ABNORMAL HIGH (ref 70–99)

## 2020-10-27 MED ORDER — HYDRALAZINE HCL 50 MG PO TABS
50.0000 mg | ORAL_TABLET | Freq: Three times a day (TID) | ORAL | Status: DC
  Administered 2020-10-27 – 2020-11-07 (×29): 50 mg via ORAL
  Filled 2020-10-27 (×33): qty 1

## 2020-10-27 MED ORDER — ISOSORBIDE DINITRATE 10 MG PO TABS
20.0000 mg | ORAL_TABLET | Freq: Three times a day (TID) | ORAL | Status: DC
  Administered 2020-10-27 – 2020-11-06 (×32): 20 mg via ORAL
  Filled 2020-10-27 (×34): qty 2

## 2020-10-27 NOTE — Plan of Care (Signed)
  Problem: Elimination: Goal: Will not experience complications related to bowel motility Outcome: Completed/Met   Problem: Elimination: Goal: Will not experience complications related to urinary retention Outcome: Completed/Met   Problem: Cardiac: Goal: Ability to achieve and maintain adequate cardiopulmonary perfusion will improve Outcome: Completed/Met

## 2020-10-27 NOTE — Progress Notes (Addendum)
Occupational Therapy Treatment Patient Details Name: Antonio Navarro MRN: [id number] DOB: [birth year] Today's Date: 10/27/2020    History of present illness [name] admitted 07/22/20 post cardiac arrest with inferolateral STEMI. He was resuscitated in the field by EMS after being found to be in V fib. Approx 30 minutes of CPR. He was intubated on arrival.  Pt with anoxic brain injury. PEG placed on 08/17/20. PMH: tobacco abuse, obesity, HTN.   OT comments  Pt progressing towards acute OT goals. Able to walk from EOB to chair placed in front of sink with min steadying assist and rw. Sat to work on grooming tasks at sink. Impaired cognition remains significant factor in current assist level with ADLs, see details below. Pt was able to correctly identify faces in family photos in the room, big smiles from pt with this activity. Minimal verbalizations this session. Pleasant demeanor. OT goals updated this session.  D/c plan remains appropriate.    Follow Up Recommendations  SNF;Supervision/Assistance - 24 hour    Equipment Recommendations  Wheelchair (measurements OT);Wheelchair cushion (measurements OT);Hospital bed;3 in 1 bedside commode    Recommendations for Other Services      Precautions / Restrictions Precautions Precautions: Fall Precaution Comments: g tube Restrictions Weight Bearing Restrictions: No       Mobility Bed Mobility Overal bed mobility: Needs Assistance Bed Mobility: Supine to Sit     Supine to sit: Min assist;HOB elevated     General bed mobility comments: pt reaching out for therapist arm to faciliate powerup to EOB position.    Transfers Overall transfer level: Needs assistance Equipment used: Rolling walker (2 wheeled) Transfers: Sit to/from Stand Sit to Stand: Max assist;Min assist;From elevated surface         General transfer comment: Max A to stand from elevated EOB height. Assist to powerup and stabilize rw as pt pulling up with both  hands on rw. Min A to control descent into recliner.    Balance Overall balance assessment: Needs assistance Sitting-balance support: Feet supported;No upper extremity supported Sitting balance-Leahy Scale: Good Sitting balance - Comments: static sitting and weight shifting no LOB. Postural control: Posterior lean Standing balance support: During functional activity;Bilateral upper extremity supported Standing balance-Leahy Scale: Poor Standing balance comment: reliant on BUE external support                           ADL either performed or assessed with clinical judgement   ADL Overall ADL's : Needs assistance/impaired     Grooming: Oral care;Maximal assistance;Sitting Grooming Details (indicate cue type and reason): max A 2/2 cognitive deficits                             Functional mobility during ADLs: Minimal assistance;Cueing for safety;Rolling walker General ADL Comments: Max encouragement to initiate each activity 2/2 cogniton. Bed mobility, sat EOB a minute. Then walked to recliner placed in front of sink. Sat for oral care tasks.     Vision       Perception     Praxis      Cognition Arousal/Alertness: Awake/alert Behavior During Therapy: Flat affect Overall Cognitive Status: Impaired/Different from baseline Area of Impairment: Attention;Following commands;Problem solving;Awareness;Safety/judgement;Memory;Orientation                 Orientation Level: Disoriented to;Place;Time;Situation Current Attention Level: Sustained Memory: Decreased short-term memory Following Commands: Follows one step commands inconsistently;Follows one  step commands with increased time Safety/Judgement: Decreased awareness of safety;Decreased awareness of deficits Awareness: Intellectual Problem Solving: Slow processing;Difficulty sequencing;Decreased initiation;Requires verbal cues;Requires tactile cues General Comments: Pt pleasantly declining OOB at  first. Able to sate names of faces in photos in his room. Decreased initiation. Max multimodal cues to initiate activitiy. Attempted oral care at sink. Pt brought toothbrush to mouth 1/5 trials. 2/5 assisted toothbrush to mouth, 2/5 pt resisiting/declining activity. Did note pt bring toothbrush out to waterstream. Making some cognitive connections during  this functional, familiar task but significant deficits remain.        Exercises     Shoulder Instructions       General Comments      Pertinent Vitals/ Pain       Pain Assessment: Faces Faces Pain Scale: No hurt Pain Intervention(s): Monitored during session  Home Living                                          Prior Functioning/Environment              Frequency  Min 2X/week        Progress Toward Goals  OT Goals(current goals can now be found in the care plan section)  Progress towards OT goals: Progressing toward goals  Acute Rehab OT Goals Patient Stated Goal: to get better OT Goal Formulation: With patient/family ADL Goals Pt Will Perform Grooming: with set-up;sitting;standing  Plan Discharge plan remains appropriate;Frequency remains appropriate    Co-evaluation                 AM-PAC OT "6 Clicks" Daily Activity     Outcome Measure   Help from another person eating meals?: A Little Help from another person taking care of personal grooming?: A Lot Help from another person toileting, which includes using toliet, bedpan, or urinal?: A Little Help from another person bathing (including washing, rinsing, drying)?: A Lot Help from another person to put on and taking off regular upper body clothing?: A Little Help from another person to put on and taking off regular lower body clothing?: A Lot 6 Click Score: 15    End of Session Equipment Utilized During Treatment: Rolling walker  OT Visit Diagnosis: Muscle weakness (generalized) (M62.81);Pain;Other abnormalities of gait and  mobility (R26.89);Cognitive communication deficit (R41.841);Low vision, both eyes (H54.2) Symptoms and signs involving cognitive functions: Other Nontraumatic ICH   Activity Tolerance Patient tolerated treatment well   Patient Left in chair;with call bell/phone within reach;with chair alarm set   Nurse Communication          Time:  - 09:30-10:00  30 minutes    Charges:  $Self Care/Home Management: 23-37 min  Tyrone Schimke, OT Acute Rehabilitation Services Pager: [phone] Office: [phone]    Hortencia Pilar 10/27/2020, 10:24 AM

## 2020-10-27 NOTE — TOC Progression Note (Signed)
Transition of Care [facility]) - Progression Note    Patient Details  Name: Antonio Navarro MRN: [id number] Date of Birth: 08-27-1960  Transition of [facility] CM/SW [place], RN Phone Number: 10/27/2020, 10:50 AM  Clinical Narrative:    Case management spoke with [name], [place] at [facility] and she is having the corporate office review the patient's clinicals for possible admission under an LOG at this time.  CM will continue to follow the patient for admission for SNF facility.  No bed offers are available at this time.   Expected Discharge Plan: Skilled Nursing Facility Barriers to Discharge: Continued Medical Work up,No SNF bed  Expected Discharge Plan and Services Expected Discharge Plan: [place] arrangements for the past 2 months: Single Family Home                                       Social Determinants of Health (SDOH) Interventions    Readmission Risk Interventions No flowsheet data found.

## 2020-10-27 NOTE — Progress Notes (Signed)
Physical Therapy Treatment Patient Details Name: Antonio Navarro MRN: [id number] DOB: July 31, 1961 Today's Date: 10/27/2020    History of Present Illness [name] admitted 07/22/20 post cardiac arrest with inferolateral STEMI. He was resuscitated in the field by EMS after being found to be in V fib. Approx 30 minutes of CPR. He was intubated on arrival.  Pt with anoxic brain injury. PEG placed on 08/17/20. PMH: tobacco abuse, obesity, HTN.    PT Comments    Pt tolerates treatment well with encouragement from PT, initially declining to walk but later becoming agreeable. Pt incontinent of stool and urine upon PT arrival and performs multiple transfers and dynamic standing balance to assist in hygiene tasks. Pt continues to demonstrate narrowed gait with feet hitting each other frequently, increasing his falls risk. PT continues to recommend SNF placement at this time.   Follow Up Recommendations  SNF;Supervision/Assistance - 24 hour     Equipment Recommendations  Rolling walker with 5" wheels;3in1 (PT);Wheelchair (measurements PT);Wheelchair cushion (measurements PT)    Recommendations for Other Services       Precautions / Restrictions Precautions Precautions: Fall Precaution Comments: g tube Restrictions Weight Bearing Restrictions: No    Mobility  Bed Mobility Overal bed mobility: Needs Assistance Bed Mobility: Sit to Supine     Supine to sit: Min assist;HOB elevated Sit to supine: Min assist   General bed mobility comments: pt requires cues and assistance to straighten body in bed after laying down diagonally across bed    Transfers Overall transfer level: Needs assistance Equipment used: Rolling walker (2 wheeled) Transfers: Sit to/from Stand Sit to Stand: Min assist         General transfer comment: cues for hand placement and initiation of mobility, physical assistance to initiate and aide in powering into standing  Ambulation/Gait Ambulation/Gait  assistance: Min assist Gait Distance (Feet): 60 Feet Assistive device: Rolling walker (2 wheeled) Gait Pattern/deviations: Step-through pattern;Ataxic;Narrow base of support Gait velocity: reduced Gait velocity interpretation: <1.31 ft/sec, indicative of household ambulator General Gait Details: pt with narrowed step-through gait, shoes often hitting each other when passing in swing-through gait phase   Stairs             Wheelchair Mobility    Modified Rankin (Stroke Patients Only)       Balance Overall balance assessment: Needs assistance Sitting-balance support: Single extremity supported;Feet supported;No upper extremity supported Sitting balance-Leahy Scale: Fair Sitting balance - Comments: static sitting and weight shifting no LOB. Postural control: Posterior lean Standing balance support: Single extremity supported;Bilateral upper extremity supported Standing balance-Leahy Scale: Poor Standing balance comment: reliant on BUE external support                            Cognition Arousal/Alertness: Awake/alert Behavior During Therapy: WFL for tasks assessed/performed Overall Cognitive Status: Impaired/Different from baseline Area of Impairment: Attention;Memory;Following commands;Safety/judgement;Awareness;Problem solving               Rancho Levels of Cognitive Functioning Rancho Los Amigos Scales of Cognitive Functioning: Confused/appropriate Orientation Level: Disoriented to;Place;Time;Situation Current Attention Level: Sustained Memory: Decreased recall of precautions;Decreased short-term memory Following Commands: Follows one step commands with increased time Safety/Judgement: Decreased awareness of safety;Decreased awareness of deficits Awareness: Intellectual Problem Solving: Slow processing;Decreased initiation;Difficulty sequencing;Requires verbal cues;Requires tactile cues General Comments: Pt pleasantly declining OOB at first. Able to  sate names of faces in photos in his room. Decreased initiation. Max multimodal cues to initiate activitiy.  Attempted oral care at sink. Pt brought toothbrush to mouth 1/5 trials. 2/5 assisted toothbrush to mouth, 2/5 pt resisiting/declining activity. Did note pt bring toothbrush out to waterstream. Making some cognitive connections during  this functional, familiar task but significant deficits remain.      Exercises      General Comments General comments (skin integrity, edema, etc.): VSS on RA      Pertinent Vitals/Pain Pain Assessment: Faces Faces Pain Scale: No hurt Pain Intervention(s): Monitored during session    Home Living                      Prior Function            PT Goals (current goals can now be found in the care plan section) Acute Rehab PT Goals Patient Stated Goal: to get better Progress towards PT goals: Progressing toward goals    Frequency    Min 3X/week      PT Plan Current plan remains appropriate    Co-evaluation              AM-PAC PT "6 Clicks" Mobility   Outcome Measure  Help needed turning from your back to your side while in a flat bed without using bedrails?: A Little Help needed moving from lying on your back to sitting on the side of a flat bed without using bedrails?: A Little Help needed moving to and from a bed to a chair (including a wheelchair)?: A Little Help needed standing up from a chair using your arms (e.g., wheelchair or bedside chair)?: A Little Help needed to walk in hospital room?: A Little Help needed climbing 3-5 steps with a railing? : A Lot 6 Click Score: 17    End of Session Equipment Utilized During Treatment: Gait belt Activity Tolerance: Patient tolerated treatment well Patient left: in bed;with call bell/phone within reach;with bed alarm set Nurse Communication: Mobility status PT Visit Diagnosis: Other abnormalities of gait and mobility (R26.89);Difficulty in walking, not elsewhere  classified (R26.2)     Time: 2119-4174 PT Time Calculation (min) (ACUTE ONLY): 23 min  Charges:  $Gait Training: 8-22 mins $Therapeutic Activity: 8-22 mins                     Zenaida Niece, PT, DPT Acute Rehabilitation Pager: [phone]    Zenaida Niece 10/27/2020, 12:40 PM

## 2020-10-27 NOTE — Progress Notes (Signed)
[facility] PROGRESS NOTE  Antonio Navarro BSW:[id number] DOB: April 05, 1961 DOA: 07/22/2020 PCP: Patient, No Pcp Per       Status: Remains inpatient appropriate because:Altered mental status and Unsafe d/c plan   Dispo: The patient is from: Home              Anticipated d/c is to: SNF              Anticipated d/c date is: > 3 days              Patient currently is medically stable to d/c.  Barriers to discharge: Medicaid and disability pending. No SNF bed offers.  No further nocturnal agitation or wandering behavior so hopeful will be eligible for SNF bed offer.  Patient able to ambulate 300 feet so hopefully he can eventually discharge home with family as long as they can provide appropriate 24/7 monitoring.  On 2/28 TOC contacted at [facility]   Code Status: Full Family Communication: Updated Sister Antonio Navarro (who works as a Recruitment consultant from 6 AM to 11 AM).  She is aware that major restriction regarding placement has to do with Medicaid application still pending and many facilities are reluctant to accept a long-term patient without Medicaid in place. DVT prophylaxis: sq heparin Vaccination status: Completed both doses of Pfizer Covid vaccination on 12/14/2019; [place] booster ordered to be given on 3/3 Foley catheter: No  HPI: 60 year old male history of hypertension prescribed Norvasc prior to admission, chronic back pain, prior renal calculi who experienced an out of hospital cardiac arrest with downtime of 9 minutes.  Initial rhythm was ventricular fibrillation.  He was defibrillated deceived 6 doses of epi before ROSC.  Subsequent work-up revealed STEMI.  He underwent cardiac catheterization with a DES placed to the circumflex.  Cardiology following and has placed patient on Brilinta and aspirin and is also being treated with beta-blocker and statins.  From a neurological standpoint he clinically has anoxic brain injury.  During this hospitalization he experienced  AKI and was treated with fluids and correction of hypoperfusion from cardiac arrest.  Echo performed this admission revealed ischemic cardiomyopathy with an EF of 40%  In addition to above patient has been unable to pass swallowing evaluations and subsequently has undergone percutaneous PEG tube placement on 12/30.  Patient is awake and moving all extremities spontaneously but does not follow commands and does not verbalize palliative care has met with patient and family and has emphasized poor prognosis and will continue to meet with the family during the hospitalization.  Current plan is to transition to SNF bed once available.  Expect need for lifelong 24/7 care for all ADLs.  Subjective: Patient awakened.  Smiling.  Told him I would see him again on Monday and he stated "all right"  Objective: Vitals:   10/27/20 0100 10/27/20 0635  BP: (!) 118/100 (!) 137/101  Pulse: 72 82  Resp: 18 18  Temp: 97.6 F (36.4 C) 98.7 F (37.1 C)  SpO2: 100% 97%    Intake/Output Summary (Last 24 hours) at 10/27/2020 0746 Last data filed at 10/27/2020 0600 Gross per 24 hour  Intake 840 ml  Output 400 ml  Net 440 ml   Filed Weights   10/26/20 0058 10/26/20 0500 10/27/20 0100  Weight: 84.3 kg 84.3 kg 85.5 kg    Exam: Constitutional: Calm and in no acute distress Heart: Normal heart sounds, extremities warm without peripheral edema, regular pulse Respiratory: Posterior lung sounds are clear to auscultation, stable  on room air Abdomen: D2 diet with variable oral intake more consistent than previously.  PEG tube remains in place LBM 3/10 Neurologic: CN 2-12 roughly without any appreciable focal neurological deficits, MOE x4, strength is 5/5.   Psychiatric: Awakens easily.  Alert.  Oriented to name.  Sometimes to place.  Not to year. Skin: Previous areas of flat white lesions have essentially resolved except for a small cluster in the mid back and these lesions are dry and  resolving   Assessment/Plan: Acute problems: Out of hospital cardiac arrest secondary to STEMI -ROSC and subsequent cardiac catheterization revealed dz in circumflex requiring DES -Continue beta-blocker, statin, Plavix, isosorbide and hydralazine  Significant anoxic brain injury secondary to cardiac arrest/deconditioning/gait instability -Consistently participating with PT and OT.  SLP has cleared for dysphagia 2 with thin liquids -Declined by CIR since family unable to provide 24/7 care.  Plan is for SNF -Continue Seroquel 100 mg at at bedtime -2/18 add Seroquel 25 mg every 12 hours as needed for agitation and wandering -Continue daytime Symmetrel  **PT NOTES FROM 3/9**  Pt received in chair, agreeable to therapy session and with good participation and tolerance for mobility, significant other present during session and encouraging. Pt continues to require minA for transfers and household distance ambulation task, pt at times with short/shallow breathing during mobility and orthostatics checked, SBP drop from 160/106 seated to 131/87 standing although pt denies dizziness/LE weakness may not be able to comprehend question due to ABI; VSS otherwise on RA. He continues to make good progress toward goals as updated today by supervising PT Chrys Racer B. Pt continues to benefit from PT services to progress toward functional mobility goals. Continue to recommend SNF.  Orthostatic BPs  Sitting taken in RUE 160/106 (124)   Standing 131/87 (102)  Sitting after standing 143/107 (116)     SLP notes 3/10:   Pt was asleep upon entering the room but became more alert with repositioning and multimodal cues. No overt s/s of aspiration were observed with limited PO trials of thin liquids. SLP attempted trials of solids and more trials of thin liquids, but pt shook his head no despite Max encouragement. Recommend continuation of his current diet and SLP will continue to follow.    OT notes 3/11: Pt  progressing towards acute OT goals. Able to walk from EOB to chair placed in front of sink with min steadying assist and rw. Sat to work on grooming tasks at sink. Impaired cognition remains significant factor in current assist level with ADLs, see details below. Pt was able to correctly identify faces in family photos in the room, big smiles from pt with this activity. Minimal verbalizations this session. Pleasant demeanor. OT goals updated this session.  D/c plan remains appropriate.   White lesions/rash on back and buttocks -Appeared after patient had laying in tube feeding    10/10/20                           10/10/20     10/16/2020     10/20/2020                                     10/20/2020 -As of 3/4 wounds not covered by dressing are drying up and are clearing.  Have opted to remove wounds covered by dressing to allow to dry up and clear as well.  Situational  depression -Started Celexa 2/16-increased to 20 mg 2/18  Ischemic cardiomyopathy -Echo this admission EF 40% -BP elevated. Increase hydralazine to 50 TID and increase Isordil to 20 TID -Continue Carvedilol -No ACE inhibitor secondary to underlying kidney disease -Follow-up echocardiogram done March 2022 demonstrates an EF of 40 to 45% with LVH and grade 1 diastolic dysfunction physiology  Hypertension -Was on Norvasc prior to admission -Continue cardiovascular meds as listed above  Acute on chronic kidney disease stage IIIb -Suspect prerenal etiology in the setting of hypoperfusion from ventricular fibrillation arrest -Baseline creatinine between 1.6 and 2; creatinine on 12/19 was 1.67-will repeat in a.m. on 08/19/2020 -Continue free water per tube and intermittently follow labs  Dysphagia, moderate protein calorie malnutrition Nutrition Problem: Inadequate oral intake Etiology: inability to eat Signs/Symptoms: NPO status  -Continue D2 diet noting patient with improved oral intake.  Will discontinue nocturnal tube feedings  for now continue to follow -Add Ensure beverage twice daily between meals. Interventions: Tube feeding,Prostat Estimated body mass index is 25.56 kg/m as calculated from the following:   Height as of this encounter: 6' (1.829 m).   Weight as of this encounter: 85.5 kg. -3/10 discussed with nutritional team patient has demonstrated improved intake of oral food and fluids.  Previously ordered bolus tube feedings have been discontinued.  We will continue Ensure as above    Other problems: Acute hypernatremia -Continue free water per tube  E. coli UTI -Has completed 5 days of Bactrim  Hyperglycemia without an underlying diagnosis of diabetes mellitus Resolved -Likely related to acute stress -Recent issues with hypoglycemia so sliding scale insulin decreased to very sensitive and Levemir decreased from 30 twice daily to 15 twice daily as of 1/14 -Hemoglobin A1c 4.7   Acute hypoxemic respiratory failure -Resolved -Stable on room air with O2 sats between 97 and 99%  Nonsustained VT -Continue amiodarone and beta-blocker -1/31 LFTs normal  Skin tear right lateral back Wound / Incision (Open or Dehisced) 07/28/20 Skin tear Back Lateral;Right;Upper (Active)  Date First Assessed/Time First Assessed: 07/28/20 0300   Wound Type: Skin tear  Location: Back  Location Orientation: Lateral;Right;Upper  Present on Admission: No    Assessments 07/28/2020  7:49 PM 10/25/2020 10:00 AM  Dressing Type None --  Wound Length (cm) -- 0 cm  Wound Width (cm) -- 0 cm  Wound Depth (cm) -- 0 cm  Wound Volume (cm^3) -- 0 cm^3  Wound Surface Area (cm^2) -- 0 cm^2  Drainage Amount None --  Treatment Cleansed --     No Linked orders to display    Data Reviewed: Basic Metabolic Panel: Recent Labs  Lab 10/21/20 0427 10/24/20 0423  NA 137 139  K 4.3 4.5  CL 103 102  CO2 23 29  GLUCOSE 98 103*  BUN 15 17  CREATININE 1.21 1.30*  CALCIUM 9.3 9.8   Liver Function Tests: Recent Labs  Lab  10/21/20 0427 10/24/20 0423  AST 32 28  ALT 70* 61*  ALKPHOS 72 71  BILITOT 0.8 0.7  PROT 7.3 7.4  ALBUMIN 3.2* 3.1*   No results for input(s): LIPASE, AMYLASE in the last 168 hours. No results for input(s): AMMONIA in the last 168 hours. CBC: Recent Labs  Lab 10/21/20 0427  WBC 8.8  HGB 12.6*  HCT 38.8*  MCV 96.0  PLT 216   Cardiac Enzymes: No results for input(s): CKTOTAL, CKMB, CKMBINDEX, TROPONINI in the last 168 hours. BNP (last 3 results) No results for input(s): BNP in the last 8760 hours.  ProBNP (last 3 results) No results for input(s): PROBNP in the last 8760 hours.  CBG: Recent Labs  Lab 10/25/20 2359 10/26/20 0603 10/26/20 1812 10/27/20 0005 10/27/20 0544  GLUCAP 131* 109* 156* 119* 133*    No results found for this or any previous visit (from the past 240 hour(s)).   Studies: No results found.  Scheduled Meds: . (feeding supplement) PROSource Plus  30 mL Oral TID BM  . amantadine  100 mg Oral Daily  . aspirin  81 mg Oral Daily  . atorvastatin  80 mg Oral Daily  . carvedilol  25 mg Oral BID WC  . chlorhexidine gluconate (MEDLINE KIT)  15 mL Mouth Rinse BID  . citalopram  20 mg Oral Daily  . clopidogrel  75 mg Oral Daily  . enoxaparin (LOVENOX) injection  40 mg Subcutaneous Q24H  . feeding supplement  237 mL Oral BID BM  . Gerhardt's butt cream   Topical TID  . hydrALAZINE  50 mg Oral Q8H  . insulin aspart  0-15 Units Subcutaneous Q6H  . isosorbide dinitrate  20 mg Oral TID  . mouth rinse  15 mL Mouth Rinse BID  . polyvinyl alcohol  1 drop Both Eyes QID  . QUEtiapine  25 mg Oral Daily  . sodium chloride flush  3 mL Intravenous Q12H   Continuous Infusions:   Active Problems:   Acute ST elevation myocardial infarction (STEMI) [facility])   Cardiac arrest (HCC)   Elevated blood pressure reading with diagnosis of hypertension   Shock circulatory (HCC)   Encephalopathy   History of ETT   Cardiomyopathy, ischemic   Anoxic brain damage  ([facility])   Dysphagia   DNR (do not resuscitate) discussion   Gait instability   E. coli UTI   Consultants:  Cardiology  Interventional radiology  PCCM  Neurology  Procedures:  Cardiac catheterization 12/4  Continuous EEG 12/4 and overnight EEG with video 12/5  Echocardiogram 12/5  EEG 12/9  Percutaneous PEG tube placement 12/30  Antibiotics: Anti-infectives (From admission, onward)   Start     Dose/Rate Route Frequency Ordered Stop   10/14/20 1000  fluconazole (DIFLUCAN) tablet 100 mg        100 mg Oral Daily 10/14/20 0848 10/18/20 0932   09/13/20 1000  sulfamethoxazole-trimethoprim (BACTRIM) 200-40 MG/5ML suspension 20 mL        20 mL Per Tube Every 12 hours 09/13/20 0734 09/19/20 0926   09/13/20 0745  cefTRIAXone (ROCEPHIN) 1 g in sodium chloride 0.9 % 100 mL IVPB  Status:  Discontinued        1 g 200 mL/hr over 30 Minutes Intravenous Every 24 hours 09/13/20 0658 09/13/20 0734   08/17/20 0000  ceFAZolin (ANCEF) IVPB 2g/100 mL premix        2 g 200 mL/hr over 30 Minutes Intravenous To Radiology 08/15/20 1346 08/17/20 1111   07/22/20 0830  cefTRIAXone (ROCEPHIN) 2 g in sodium chloride 0.9 % 100 mL IVPB        2 g 200 mL/hr over 30 Minutes Intravenous Every 24 hours 07/22/20 0720 07/26/20 0858       Time spent: 20 minutes    Erin Hearing ANP  [facility] 7 am - 330 pm/M-F for direct patient care and secure chat Please refer to Amion for contact information 97 days

## 2020-10-28 LAB — GLUCOSE, CAPILLARY
Glucose-Capillary: 111 mg/dL — ABNORMAL HIGH (ref 70–99)
Glucose-Capillary: 126 mg/dL — ABNORMAL HIGH (ref 70–99)
Glucose-Capillary: 169 mg/dL — ABNORMAL HIGH (ref 70–99)

## 2020-10-28 NOTE — Progress Notes (Signed)
PROGRESS NOTE    Antonio Navarro  JIR:[id number] DOB: [birth year] DOA: 07/22/2020 PCP: Patient, No Pcp Per    Brief Narrative:  60 year old male history of hypertension prescribed Norvasc prior to admission, chronic back pain, prior renal calculi who experienced an out of hospital cardiac arrest with downtime of 9 minutes.  Initial rhythm was ventricular fibrillation.  He was defibrillated deceived 6 doses of epi before ROSC.  Subsequent work-up revealed STEMI.  He underwent cardiac catheterization with a DES placed to the circumflex.  Cardiology following and has placed patient on Brilinta and aspirin and is also being treated with beta-blocker and statins.  From a neurological standpoint he clinically has anoxic brain injury.  During this hospitalization he experienced AKI and was treated with fluids and correction of hypoperfusion from cardiac arrest.  Echo performed this admission revealed ischemic cardiomyopathy with an EF of 40%  In addition to above patient has been unable to pass swallowing evaluations and subsequently has undergone percutaneous PEG tube placement on 12/30.  Patient is awake and moving all extremities spontaneously but does not follow commands and does not verbalize palliative care has met with patient and family and has emphasized poor prognosis and will continue to meet with the family during the hospitalization.  Current plan is to transition to SNF bed once available.  Expect need for lifelong 24/7 care for all ADLs.  Assessment & Plan:   Active Problems:   Acute ST elevation myocardial infarction (STEMI) (HCC)   Cardiac arrest (HCC)   Elevated blood pressure reading with diagnosis of hypertension   Shock circulatory (HCC)   Encephalopathy   History of ETT   Cardiomyopathy, ischemic   Anoxic brain damage ([place])   Dysphagia   DNR (do not resuscitate) discussion   Gait instability   E. coli UTI  Acute problems: Out of hospital cardiac arrest secondary to  STEMI -ROSC and subsequent cardiac catheterization revealed dz in circumflex requiring DES -Continue beta-blocker, statin, Plavix, isosorbide and hydralazine -BP currently well controlled per below. Cont current regimen  Significant anoxic brain injury secondary to cardiac arrest/deconditioning/gait instability -Consistently participating with PT and OT.  SLP has cleared for dysphagia 2 with thin liquids -Declined by CIR since family unable to provide 24/7 care.  Plan is for SNF -Continue Seroquel 100 mg at at bedtime -2/18 add Seroquel 25 mg every 12 hours as needed for agitation and wandering -Continue daytime Symmetrel as tolerated **PT NOTES FROM 3/9**  Pt received in chair, agreeable to therapy session and with good participation and tolerance for mobility, significant other present during session and encouraging. Pt continues to require minA for transfers and household distance ambulation task, pt at times with short/shallow breathing during mobility and orthostatics checked, SBP drop from 160/106 seated to 131/87 standing although pt denies dizziness/LE weakness may not be able tocomprehend questiondue to ABI; VSS otherwise on RA. He continues to make good progress toward goals as updated today by supervising PT Chrys Racer B.Pt continues to benefit from PT services to progress toward functional mobility goals.Continue to recommend SNF. Orthostatic BPs  Sittingtaken in RUE 160/106 (124)  Standing 131/87 (102)  Sitting after standing 143/107 (116)     SLP notes 3/10:   Pt was asleep upon entering the room but became more alert with repositioning and multimodal cues. No overt s/s of aspiration were observed with limited PO trials of thin liquids. SLP attempted trials of solids and more trials of thin liquids, but pt shook his head no despite Max  encouragement. Recommend continuation of his current diet and SLP will continue to follow.   OT notes 3/11: Pt progressing towards  acute OT goals. Able to walk from EOB to chair placed in front of sink with min steadying assist and rw. Sat to work on grooming tasks at sink. Impaired cognition remains significant factor in current assist level with ADLs, see details below. Pt was able to correctly identify faces in family photos in the room, big smiles from pt with this activity. Minimal verbalizations this session. Pleasant demeanor. OT goals updated this session. D/c plan remains appropriate.   White lesions/rash on back and buttocks -Appeared after patient had laying in tube feeding    10/10/20                           10/10/20     10/16/2020     10/20/2020                                     10/20/2020 -As of 3/4 wounds not covered by dressing are drying up and are clearing.  Have opted to remove wounds covered by dressing to allow to dry up and clear as well.  Situational depression -Started Celexa 2/16-increased to 20 mg 2/18  Ischemic cardiomyopathy -Echo this admission EF 40% -BP elevated. Increase hydralazine to 50 TID and increase Isordil to 20 TID -Continue Carvedilol -No ACE inhibitor secondary to underlying kidney disease -Follow-up echocardiogram done March 2022 demonstrates an EF of 40 to 45% with LVH and grade 1 diastolic dysfunction physiology  Hypertension -Was on Norvasc prior to admission -Continue cardiovascular meds as listed above -BP well controlled  Acute on chronic kidney disease stage IIIb -Suspect prerenal etiology in the setting of hypoperfusion from ventricular fibrillation arrest -Baseline creatinine between 1.6 and 2; creatinine on 12/19 was 1.67-will repeat in a.m. on 08/19/2020 -Continue free water per tube and intermittently follow labs  Dysphagia, moderate protein calorie malnutrition Nutrition Problem: Inadequate oral intake Etiology: inability to eat Signs/Symptoms: NPO status  -Continue D2 diet noting patient with improved oral intake.  Will discontinue nocturnal tube  feedings for now continue to follow -Add Ensure beverage twice daily between meals. Interventions: Tube feeding,Prostat Estimated body mass index is 25.56 kg/m as calculated from the following:   Height as of this encounter: 6' (1.829 m).   Weight as of this encounter: 85.5 kg. -3/10 discussed with nutritional team patient has demonstrated improved intake of oral food and fluids.  Previously ordered bolus tube feedings have been discontinued.  We will continue Ensure as above    Other problems: Acute hypernatremia -Continue free water per tube  E. coli UTI -Has completed 5 days of Bactrim  Hyperglycemia without an underlying diagnosis of diabetes mellitus Resolved -Likely related to acute stress -Recent issues with hypoglycemia so sliding scale insulin decreased to very sensitive and Levemir decreased from 30 twice daily to 15 twice daily as of 1/14 -Hemoglobin A1c 4.7   Acute hypoxemic respiratory failure -Resolved -Stable on room air with O2 sats between 97 and 99%  Nonsustained VT -Continue amiodarone and beta-blocker -1/31 LFTs normal  Skin tear right lateral back     Wound / Incision (Open or Dehisced) 07/28/20 Skin tear Back Lateral;Right;Upper (Active)  Date First Assessed/Time First Assessed: 07/28/20 0300   Wound Type: Skin tear  Location: Back  Location Orientation: Lateral;Right;Upper  Present  on Admission: No    Assessments 07/28/2020  7:49 PM 10/25/2020 10:00 AM  Dressing Type None --  Wound Length (cm) -- 0 cm  Wound Width (cm) -- 0 cm  Wound Depth (cm) -- 0 cm  Wound Volume (cm^3) -- 0 cm^3  Wound Surface Area (cm^2) -- 0 cm^2  Drainage Amount None --  Treatment Cleansed --   DVT prophylaxis: Lovenox subq Code Status: Full Family Communication: Pt in room, family currently not at bedside  Status is: Inpatient  Remains inpatient appropriate because:Unsafe d/c plan   Dispo: The patient is from: Home              Anticipated d/c is to: SNF               Patient currently is medically stable to d/c. Just awaiting dispo   Difficult to place patient Yes       Consultants:   Cardiology  Interventional radiology  PCCM  Neurology  Procedures:   Cardiac catheterization 12/4  Continuous EEG 12/4 and overnight EEG with video 12/5  Echocardiogram 12/5  EEG 12/9  Percutaneous PEG tube placement 12/30  Antimicrobials: Anti-infectives (From admission, onward)   Start     Dose/Rate Route Frequency Ordered Stop   10/14/20 1000  fluconazole (DIFLUCAN) tablet 100 mg        100 mg Oral Daily 10/14/20 0848 10/18/20 0932   09/13/20 1000  sulfamethoxazole-trimethoprim (BACTRIM) 200-40 MG/5ML suspension 20 mL        20 mL Per Tube Every 12 hours 09/13/20 0734 09/19/20 0926   09/13/20 0745  cefTRIAXone (ROCEPHIN) 1 g in sodium chloride 0.9 % 100 mL IVPB  Status:  Discontinued        1 g 200 mL/hr over 30 Minutes Intravenous Every 24 hours 09/13/20 0658 09/13/20 0734   08/17/20 0000  ceFAZolin (ANCEF) IVPB 2g/100 mL premix        2 g 200 mL/hr over 30 Minutes Intravenous To Radiology 08/15/20 1346 08/17/20 1111   07/22/20 0830  cefTRIAXone (ROCEPHIN) 2 g in sodium chloride 0.9 % 100 mL IVPB        2 g 200 mL/hr over 30 Minutes Intravenous Every 24 hours 07/22/20 0720 07/26/20 0858       Subjective: Unable to assess given current state  Objective: Vitals:   10/28/20 0632 10/28/20 1011 10/28/20 1019 10/28/20 1411  BP: 139/82 (!) 131/98 (!) 131/98 107/78  Pulse: 70 76 75   Resp: 18  16   Temp: (!) 97.4 F (36.3 C)  98.6 F (37 C)   TempSrc: Oral  Oral   SpO2: 97%  100%   Weight:      Height:        Intake/Output Summary (Last 24 hours) at 10/28/2020 1428 Last data filed at 10/28/2020 1300 Gross per 24 hour  Intake 1090 ml  Output 400 ml  Net 690 ml   Filed Weights   10/26/20 0500 10/27/20 0100 10/28/20 0149  Weight: 84.3 kg 85.5 kg 84.5 kg    Examination:  General exam: Appears calm and comfortable   Respiratory system: no audible wheezing. Respiratory effort normal.  Data Reviewed: I have personally reviewed following labs and imaging studies  CBC: No results for input(s): WBC, NEUTROABS, HGB, HCT, MCV, PLT in the last 168 hours. Basic Metabolic Panel: Recent Labs  Lab 10/24/20 0423  NA 139  K 4.5  CL 102  CO2 29  GLUCOSE 103*  BUN 17  CREATININE 1.30*  CALCIUM 9.8   GFR: Estimated Creatinine Clearance: 68 mL/min (A) (by C-G formula based on SCr of 1.3 mg/dL (H)). Liver Function Tests: Recent Labs  Lab 10/24/20 0423  AST 28  ALT 61*  ALKPHOS 71  BILITOT 0.7  PROT 7.4  ALBUMIN 3.1*   No results for input(s): LIPASE, AMYLASE in the last 168 hours. No results for input(s): AMMONIA in the last 168 hours. Coagulation Profile: No results for input(s): INR, PROTIME in the last 168 hours. Cardiac Enzymes: No results for input(s): CKTOTAL, CKMB, CKMBINDEX, TROPONINI in the last 168 hours. BNP (last 3 results) No results for input(s): PROBNP in the last 8760 hours. HbA1C: No results for input(s): HGBA1C in the last 72 hours. CBG: Recent Labs  Lab 10/27/20 1105 10/27/20 1558 10/27/20 2321 10/28/20 0633 10/28/20 1147  GLUCAP 139* 111* 158* 111* 126*   Lipid Profile: No results for input(s): CHOL, HDL, LDLCALC, TRIG, CHOLHDL, LDLDIRECT in the last 72 hours. Thyroid Function Tests: No results for input(s): TSH, T4TOTAL, FREET4, T3FREE, THYROIDAB in the last 72 hours. Anemia Panel: No results for input(s): VITAMINB12, FOLATE, FERRITIN, TIBC, IRON, RETICCTPCT in the last 72 hours. Sepsis Labs: No results for input(s): PROCALCITON, LATICACIDVEN in the last 168 hours.  No results found for this or any previous visit (from the past 240 hour(s)).   Radiology Studies: No results found.  Scheduled Meds: . (feeding supplement) PROSource Plus  30 mL Oral TID BM  . amantadine  100 mg Oral Daily  . aspirin  81 mg Oral Daily  . atorvastatin  80 mg Oral Daily  .  carvedilol  25 mg Oral BID WC  . chlorhexidine gluconate (MEDLINE KIT)  15 mL Mouth Rinse BID  . citalopram  20 mg Oral Daily  . clopidogrel  75 mg Oral Daily  . enoxaparin (LOVENOX) injection  40 mg Subcutaneous Q24H  . feeding supplement  237 mL Oral BID BM  . Gerhardt's butt cream   Topical TID  . hydrALAZINE  50 mg Oral Q8H  . insulin aspart  0-15 Units Subcutaneous Q6H  . isosorbide dinitrate  20 mg Oral TID  . mouth rinse  15 mL Mouth Rinse BID  . polyvinyl alcohol  1 drop Both Eyes QID  . QUEtiapine  25 mg Oral Daily  . sodium chloride flush  3 mL Intravenous Q12H   Continuous Infusions:   LOS: 98 days   Marylu Lund, MD [facility] Pager On Amion  If 7PM-7AM, please contact night-coverage 10/28/2020, 2:28 PM

## 2020-10-29 LAB — GLUCOSE, CAPILLARY
Glucose-Capillary: 118 mg/dL — ABNORMAL HIGH (ref 70–99)
Glucose-Capillary: 119 mg/dL — ABNORMAL HIGH (ref 70–99)
Glucose-Capillary: 158 mg/dL — ABNORMAL HIGH (ref 70–99)
Glucose-Capillary: 166 mg/dL — ABNORMAL HIGH (ref 70–99)

## 2020-10-29 NOTE — Progress Notes (Signed)
PROGRESS NOTE    Antonio Navarro  VXB:[id number] DOB: July 04, 1961 DOA: 07/22/2020 PCP: Patient, No Pcp Per    Brief Narrative:  60 year old male history of hypertension prescribed Norvasc prior to admission, chronic back pain, prior renal calculi who experienced an out of hospital cardiac arrest with downtime of 9 minutes.  Initial rhythm was ventricular fibrillation.  He was defibrillated deceived 6 doses of epi before ROSC.  Subsequent work-up revealed STEMI.  He underwent cardiac catheterization with a DES placed to the circumflex.  Cardiology following and has placed patient on Brilinta and aspirin and is also being treated with beta-blocker and statins.  From a neurological standpoint he clinically has anoxic brain injury.  During this hospitalization he experienced AKI and was treated with fluids and correction of hypoperfusion from cardiac arrest.  Echo performed this admission revealed ischemic cardiomyopathy with an EF of 40%  In addition to above patient has been unable to pass swallowing evaluations and subsequently has undergone percutaneous PEG tube placement on 12/30.  Patient is awake and moving all extremities spontaneously but does not follow commands and does not verbalize palliative care has met with patient and family and has emphasized poor prognosis and will continue to meet with the family during the hospitalization.  Current plan is to transition to SNF bed once available.  Expect need for lifelong 24/7 care for all ADLs.  Assessment & Plan:   Active Problems:   Acute ST elevation myocardial infarction (STEMI) (HCC)   Cardiac arrest (HCC)   Elevated blood pressure reading with diagnosis of hypertension   Shock circulatory (HCC)   Encephalopathy   History of ETT   Cardiomyopathy, ischemic   Anoxic brain damage ([place])   Dysphagia   DNR (do not resuscitate) discussion   Gait instability   E. coli UTI  Acute problems: Out of hospital cardiac arrest secondary to  STEMI -ROSC and subsequent cardiac catheterization revealed dz in circumflex requiring DES -Continue beta-blocker, statin, Plavix, isosorbide and hydralazine -BP remains well controlled at this time  Significant anoxic brain injury secondary to cardiac arrest/deconditioning/gait instability -Consistently participating with PT and OT.  SLP has cleared for dysphagia 2 with thin liquids -Declined by CIR since family unable to provide 24/7 care.  Plan is for SNF -Continue Seroquel 100 mg at at bedtime -2/18 add Seroquel 25 mg every 12 hours as needed for agitation and wandering -Continue daytime Symmetrel as tolerated **PT NOTES FROM 3/9**  Pt received in chair, agreeable to therapy session and with good participation and tolerance for mobility, significant other present during session and encouraging. Pt continues to require minA for transfers and household distance ambulation task, pt at times with short/shallow breathing during mobility and orthostatics checked, SBP drop from 160/106 seated to 131/87 standing although pt denies dizziness/LE weakness may not be able tocomprehend questiondue to ABI; VSS otherwise on RA. He continues to make good progress toward goals as updated today by supervising PT Chrys Racer B.Pt continues to benefit from PT services to progress toward functional mobility goals.Continue to recommend SNF. Orthostatic BPs  Sittingtaken in RUE 160/106 (124)  Standing 131/87 (102)  Sitting after standing 143/107 (116)     SLP notes 3/10:   Pt was asleep upon entering the room but became more alert with repositioning and multimodal cues. No overt s/s of aspiration were observed with limited PO trials of thin liquids. SLP attempted trials of solids and more trials of thin liquids, but pt shook his head no despite Max encouragement. Recommend  continuation of his current diet and SLP will continue to follow.   OT notes 3/11: Pt progressing towards acute OT goals. Able  to walk from EOB to chair placed in front of sink with min steadying assist and rw. Sat to work on grooming tasks at sink. Impaired cognition remains significant factor in current assist level with ADLs, see details below. Pt was able to correctly identify faces in family photos in the room, big smiles from pt with this activity. Minimal verbalizations this session. Pleasant demeanor. OT goals updated this session. D/c plan remains appropriate.   White lesions/rash on back and buttocks -Appeared after patient had laying in tube feeding    10/10/20                           10/10/20     10/16/2020     10/20/2020                                     10/20/2020 -As of 3/4 wounds not covered by dressing are drying up and are clearing.  Have opted to remove wounds covered by dressing to allow to dry up and clear as well.  Situational depression -Started Celexa 2/16-increased to 20 mg 2/18  Ischemic cardiomyopathy -Echo this admission EF 40% -BP elevated. Increase hydralazine to 50 TID and increase Isordil to 20 TID -Continue Carvedilol -No ACE inhibitor secondary to underlying kidney disease -Follow-up echocardiogram done March 2022 demonstrates an EF of 40 to 45% with LVH and grade 1 diastolic dysfunction physiology  Hypertension -Was on Norvasc prior to admission -Continue cardiovascular meds as listed above -BP well controlled  Acute on chronic kidney disease stage IIIb -Suspect prerenal etiology in the setting of hypoperfusion from ventricular fibrillation arrest -Baseline creatinine between 1.6 and 2; creatinine on 12/19 was 1.67-will repeat in a.m. on 08/19/2020 -Continue free water per tube and intermittently follow labs  Dysphagia, moderate protein calorie malnutrition Nutrition Problem: Inadequate oral intake Etiology: inability to eat Signs/Symptoms: NPO status  -Continue D2 diet noting patient with improved oral intake.  Will discontinue nocturnal tube feedings for now  continue to follow -Add Ensure beverage twice daily between meals. Interventions: Tube feeding,Prostat Estimated body mass index is 25.56 kg/m as calculated from the following:   Height as of this encounter: 6' (1.829 m).   Weight as of this encounter: 85.5 kg. -3/10 discussed with nutritional team patient has demonstrated improved intake of oral food and fluids.  Previously ordered bolus tube feedings have been discontinued.  We will continue Ensure as above    Other problems: Acute hypernatremia -Continue free water per tube  E. coli UTI -Has completed 5 days of Bactrim  Hyperglycemia without an underlying diagnosis of diabetes mellitus Resolved -Likely related to acute stress -Recent issues with hypoglycemia so sliding scale insulin decreased to very sensitive and Levemir decreased from 30 twice daily to 15 twice daily as of 1/14 -Hemoglobin A1c 4.7   Acute hypoxemic respiratory failure -Resolved -Stable on room air with O2 sats between 97 and 99%  Nonsustained VT -Continue amiodarone and beta-blocker -1/31 LFTs normal  Skin tear right lateral back     Wound / Incision (Open or Dehisced) 07/28/20 Skin tear Back Lateral;Right;Upper (Active)  Date First Assessed/Time First Assessed: 07/28/20 0300   Wound Type: Skin tear  Location: Back  Location Orientation: Lateral;Right;Upper  Present on Admission:  No    Assessments 07/28/2020  7:49 PM 10/25/2020 10:00 AM  Dressing Type None --  Wound Length (cm) -- 0 cm  Wound Width (cm) -- 0 cm  Wound Depth (cm) -- 0 cm  Wound Volume (cm^3) -- 0 cm^3  Wound Surface Area (cm^2) -- 0 cm^2  Drainage Amount None --  Treatment Cleansed --   DVT prophylaxis: Lovenox subq Code Status: Full Family Communication: Pt in room, family currently not at bedside  Status is: Inpatient  Remains inpatient appropriate because:Unsafe d/c plan   Dispo: The patient is from: Home              Anticipated d/c is to: SNF               Patient currently is medically stable to d/c. Just awaiting dispo   Difficult to place patient Yes       Consultants:   Cardiology  Interventional radiology  PCCM  Neurology  Procedures:   Cardiac catheterization 12/4  Continuous EEG 12/4 and overnight EEG with video 12/5  Echocardiogram 12/5  EEG 12/9  Percutaneous PEG tube placement 12/30  Antimicrobials: Anti-infectives (From admission, onward)   Start     Dose/Rate Route Frequency Ordered Stop   10/14/20 1000  fluconazole (DIFLUCAN) tablet 100 mg        100 mg Oral Daily 10/14/20 0848 10/18/20 0932   09/13/20 1000  sulfamethoxazole-trimethoprim (BACTRIM) 200-40 MG/5ML suspension 20 mL        20 mL Per Tube Every 12 hours 09/13/20 0734 09/19/20 0926   09/13/20 0745  cefTRIAXone (ROCEPHIN) 1 g in sodium chloride 0.9 % 100 mL IVPB  Status:  Discontinued        1 g 200 mL/hr over 30 Minutes Intravenous Every 24 hours 09/13/20 0658 09/13/20 0734   08/17/20 0000  ceFAZolin (ANCEF) IVPB 2g/100 mL premix        2 g 200 mL/hr over 30 Minutes Intravenous To Radiology 08/15/20 1346 08/17/20 1111   07/22/20 0830  cefTRIAXone (ROCEPHIN) 2 g in sodium chloride 0.9 % 100 mL IVPB        2 g 200 mL/hr over 30 Minutes Intravenous Every 24 hours 07/22/20 0720 07/26/20 0858      Subjective: Nods "yes" to desiring help with breakfast this AM  Objective: Vitals:   10/28/20 1411 10/28/20 1939 10/29/20 0108 10/29/20 0450  BP: 107/78 (!) 149/83  125/87  Pulse:  90  73  Resp:  19  18  Temp:  98.5 F (36.9 C)  (!) 97.5 F (36.4 C)  TempSrc:  Oral  Oral  SpO2:  97%  95%  Weight:   84.9 kg   Height:        Intake/Output Summary (Last 24 hours) at 10/29/2020 1347 Last data filed at 10/29/2020 0101 Gross per 24 hour  Intake 300 ml  Output 325 ml  Net -25 ml   Filed Weights   10/27/20 0100 10/28/20 0149 10/29/20 0108  Weight: 85.5 kg 84.5 kg 84.9 kg    Examination: General exam: Awake, laying in bed, in  nad Respiratory system: Normal respiratory effort, no wheezing  Data Reviewed: I have personally reviewed following labs and imaging studies  CBC: No results for input(s): WBC, NEUTROABS, HGB, HCT, MCV, PLT in the last 168 hours. Basic Metabolic Panel: Recent Labs  Lab 10/24/20 0423  NA 139  K 4.5  CL 102  CO2 29  GLUCOSE 103*  BUN 17  CREATININE 1.30*  CALCIUM 9.8   GFR: Estimated Creatinine Clearance: 68 mL/min (A) (by C-G formula based on SCr of 1.3 mg/dL (H)). Liver Function Tests: Recent Labs  Lab 10/24/20 0423  AST 28  ALT 61*  ALKPHOS 71  BILITOT 0.7  PROT 7.4  ALBUMIN 3.1*   No results for input(s): LIPASE, AMYLASE in the last 168 hours. No results for input(s): AMMONIA in the last 168 hours. Coagulation Profile: No results for input(s): INR, PROTIME in the last 168 hours. Cardiac Enzymes: No results for input(s): CKTOTAL, CKMB, CKMBINDEX, TROPONINI in the last 168 hours. BNP (last 3 results) No results for input(s): PROBNP in the last 8760 hours. HbA1C: No results for input(s): HGBA1C in the last 72 hours. CBG: Recent Labs  Lab 10/28/20 1147 10/28/20 1604 10/29/20 0057 10/29/20 0603 10/29/20 1230  GLUCAP 126* 169* 118* 119* 158*   Lipid Profile: No results for input(s): CHOL, HDL, LDLCALC, TRIG, CHOLHDL, LDLDIRECT in the last 72 hours. Thyroid Function Tests: No results for input(s): TSH, T4TOTAL, FREET4, T3FREE, THYROIDAB in the last 72 hours. Anemia Panel: No results for input(s): VITAMINB12, FOLATE, FERRITIN, TIBC, IRON, RETICCTPCT in the last 72 hours. Sepsis Labs: No results for input(s): PROCALCITON, LATICACIDVEN in the last 168 hours.  No results found for this or any previous visit (from the past 240 hour(s)).   Radiology Studies: No results found.  Scheduled Meds: . (feeding supplement) PROSource Plus  30 mL Oral TID BM  . amantadine  100 mg Oral Daily  . aspirin  81 mg Oral Daily  . atorvastatin  80 mg Oral Daily  . carvedilol   25 mg Oral BID WC  . chlorhexidine gluconate (MEDLINE KIT)  15 mL Mouth Rinse BID  . citalopram  20 mg Oral Daily  . clopidogrel  75 mg Oral Daily  . enoxaparin (LOVENOX) injection  40 mg Subcutaneous Q24H  . feeding supplement  237 mL Oral BID BM  . Gerhardt's butt cream   Topical TID  . hydrALAZINE  50 mg Oral Q8H  . insulin aspart  0-15 Units Subcutaneous Q6H  . isosorbide dinitrate  20 mg Oral TID  . mouth rinse  15 mL Mouth Rinse BID  . polyvinyl alcohol  1 drop Both Eyes QID  . QUEtiapine  25 mg Oral Daily  . sodium chloride flush  3 mL Intravenous Q12H   Continuous Infusions:   LOS: 99 days    , MD [facility] Pager On Amion  If 7PM-7AM, please contact night-coverage 10/29/2020, 1:47 PM    

## 2020-10-29 NOTE — Plan of Care (Signed)
  Problem: Neurologic: Goal: Promote progressive neurologic recovery Outcome: Adequate for Discharge

## 2020-10-30 DIAGNOSIS — I10 Essential (primary) hypertension: Secondary | ICD-10-CM

## 2020-10-30 LAB — CBC
HCT: 37.7 % — ABNORMAL LOW (ref 39.0–52.0)
Hemoglobin: 12.6 g/dL — ABNORMAL LOW (ref 13.0–17.0)
MCH: 31.3 pg (ref 26.0–34.0)
MCHC: 33.4 g/dL (ref 30.0–36.0)
MCV: 93.8 fL (ref 80.0–100.0)
Platelets: 220 10*3/uL (ref 150–400)
RBC: 4.02 MIL/uL — ABNORMAL LOW (ref 4.22–5.81)
RDW: 13.8 % (ref 11.5–15.5)
WBC: 8.1 10*3/uL (ref 4.0–10.5)
nRBC: 0 % (ref 0.0–0.2)

## 2020-10-30 LAB — COMPREHENSIVE METABOLIC PANEL
ALT: 50 U/L — ABNORMAL HIGH (ref 0–44)
AST: 15 U/L (ref 15–41)
Albumin: 3.2 g/dL — ABNORMAL LOW (ref 3.5–5.0)
Alkaline Phosphatase: 71 U/L (ref 38–126)
Anion gap: 9 (ref 5–15)
BUN: 16 mg/dL (ref 6–20)
CO2: 26 mmol/L (ref 22–32)
Calcium: 9.5 mg/dL (ref 8.9–10.3)
Chloride: 105 mmol/L (ref 98–111)
Creatinine, Ser: 1.33 mg/dL — ABNORMAL HIGH (ref 0.61–1.24)
GFR, Estimated: >60 mL/min (ref >60)
Glucose, Bld: 103 mg/dL — ABNORMAL HIGH (ref 70–99)
Potassium: 4.2 mmol/L (ref 3.5–5.1)
Sodium: 140 mmol/L (ref 135–145)
Total Bilirubin: 0.5 mg/dL (ref 0.3–1.2)
Total Protein: 7.4 g/dL (ref 6.5–8.1)

## 2020-10-30 LAB — GLUCOSE, CAPILLARY
Glucose-Capillary: 106 mg/dL — ABNORMAL HIGH (ref 70–99)
Glucose-Capillary: 122 mg/dL — ABNORMAL HIGH (ref 70–99)
Glucose-Capillary: 143 mg/dL — ABNORMAL HIGH (ref 70–99)
Glucose-Capillary: 169 mg/dL — ABNORMAL HIGH (ref 70–99)

## 2020-10-30 NOTE — Progress Notes (Signed)
Physical Therapy Treatment Patient Details Name: Antonio Navarro MRN: [id number] DOB: 1960-12-29 Today's Date: 10/30/2020    History of Present Illness [name] admitted 07/22/20 post cardiac arrest with inferolateral STEMI. He was resuscitated in the field by EMS after being found to be in V fib. Approx 30 minutes of CPR. He was intubated on arrival.  Pt with anoxic brain injury. PEG placed on 08/17/20. PMH: tobacco abuse, obesity, HTN.    PT Comments    Making slow progression toward goals.  Emphasis on following simple direction, tranfers to stand, standing balance/sink activity, progression of gait stability/stamina with the RW.    Follow Up Recommendations  SNF;Supervision/Assistance - 24 hour     Equipment Recommendations  Rolling walker with 5" wheels;3in1 (PT);Wheelchair (measurements PT);Wheelchair cushion (measurements PT)    Recommendations for Other Services       Precautions / Restrictions Precautions Precautions: Fall    Mobility  Bed Mobility               General bed mobility comments: OOB on arrival    Transfers Overall transfer level: Needs assistance Equipment used: Rolling walker (2 wheeled) Transfers: Sit to/from Stand Sit to Stand: Min assist         General transfer comment: cues for hand placement, assist to come forward and stay forward, minimal boost.  Ambulation/Gait Ambulation/Gait assistance: Min assist Gait Distance (Feet): 280 Feet Assistive device: Rolling walker (2 wheeled) Gait Pattern/deviations: Step-through pattern     General Gait Details: mildly ataxic, narrowed gait, with infrequent mild stagger and consistent drift to the right.  Pt needs redirection often.   Stairs             Wheelchair Mobility    Modified Rankin (Stroke Patients Only)       Balance   Sitting-balance support: Single extremity supported;Feet supported Sitting balance-Leahy Scale: Fair     Standing balance support: Single  extremity supported;No upper extremity supported;During functional activity Standing balance-Leahy Scale: Poor Standing balance comment: Needed minimal stability support standing at the sink or with 1 hand supporting while pt brushed his teeth.  Pt knee the toothbrush was for his teeth, but initially had trouble manipulating the toothbrush until place appropriately in the R hand.  Pt needed direction/redirection for general technique and thoroughness.                            Cognition Arousal/Alertness: Awake/alert Behavior During Therapy: WFL for tasks assessed/performed Overall Cognitive Status: Impaired/Different from baseline Area of Impairment: Attention;Memory;Following commands;Safety/judgement;Awareness;Problem solving               Rancho Levels of Cognitive Functioning Ranch[place] Amigos Scales of Cognitive Functioning: Confused/appropriate Orientation Level: Situation;Time Current Attention Level: Sustained Memory: Decreased short-term memory Following Commands: Follows one step commands with increased time Safety/Judgement: Decreased awareness of safety;Decreased awareness of deficits Awareness: Intellectual Problem Solving: Slow processing;Requires verbal cues        Exercises      General Comments        Pertinent Vitals/Pain Pain Assessment: Faces Faces Pain Scale: No hurt Pain Intervention(s): Monitored during session    Home Living                      Prior Function            PT Goals (current goals can now be found in the care plan section) Acute Rehab PT  Goals Patient Stated Goal: to get better PT Goal Formulation: With patient Time For Goal Achievement: 11/08/20 Potential to Achieve Goals: Fair Progress towards PT goals: Progressing toward goals    Frequency    Min 3X/week      PT Plan Current plan remains appropriate    Co-evaluation              AM-PAC PT "6 Clicks" Mobility   Outcome Measure   Help needed turning from your back to your side while in a flat bed without using bedrails?: A Little Help needed moving from lying on your back to sitting on the side of a flat bed without using bedrails?: A Little Help needed moving to and from a bed to a chair (including a wheelchair)?: A Little Help needed standing up from a chair using your arms (e.g., wheelchair or bedside chair)?: A Little Help needed to walk in hospital room?: A Little Help needed climbing 3-5 steps with a railing? : A Lot 6 Click Score: 17    End of Session   Activity Tolerance: Patient tolerated treatment well;Other (comment) (mild fatigue affecting mobility) Patient left: in chair;with call bell/phone within reach;with chair alarm set;Other (comment) (posey alarm belt.) Nurse Communication: Mobility status PT Visit Diagnosis: Other abnormalities of gait and mobility (R26.89);Difficulty in walking, not elsewhere classified (R26.2)     Time: 4496-7591 PT Time Calculation (min) (ACUTE ONLY): 24 min  Charges:  $Gait Training: 8-22 mins $Therapeutic Activity: 8-22 mins                     10/30/2020  Antonio Navarro., PT Acute Rehabilitation Services [phone]  (pager) [phone]  (office)   Antonio Navarro 10/30/2020, 3:43 PM

## 2020-10-30 NOTE — Progress Notes (Signed)
  Speech Language Pathology Treatment: Dysphagia;Cognitive-Linquistic  Patient Details Name: Antonio Navarro MRN: [id number] DOB: 19-Nov-1960 Today's Date: 10/30/2020 Time: 9622-2979 SLP Time Calculation (min) (ACUTE ONLY): 14 min  Assessment / Plan / Recommendation Clinical Impression  Pt was asleep when SLP entered the room but became more alert with repositioning and multimodal cues. He was agreeable to more PO trials today in which no overt s/s of aspiration were noted with liquids or solids; however, prolonged mastication was observed with solids. Pt continues to demonstrate limited verbal expression despite Max multimodal cues, but did nod/shake his head. He repeated the word "off" to indicate that he wanted his lights off at the end of today's session with Max encouragement and verbal cues. SLP will continue to follow.    HPI HPI: Pt is a 60 yo male s/p arrest with downtime 9 minutes. Initial rhythm was V. fib, shocked and received 6 rounds of epi before ROSC achieved.  Total CPR time was 30 minutes. ETT 12/4-12/13. MRI 12/7 suggestive of a widespread anoxic injury. PMH includes kidney stones and HTN.      SLP Plan  Continue with current plan of care       Recommendations  Diet recommendations: Dysphagia 2 (fine chop);Thin liquid Liquids provided via: Cup;Straw Medication Administration: Crushed with puree Supervision: Staff to assist with self feeding;Full supervision/cueing for compensatory strategies Compensations: Slow rate;Small sips/bites;Minimize environmental distractions Postural Changes and/or Swallow Maneuvers: Seated upright 90 degrees                Oral Care Recommendations: Oral care BID Follow up Recommendations: Skilled Nursing facility SLP Visit Diagnosis: Dysphagia, unspecified (R13.10);Cognitive communication deficit (R41.841) Plan: Continue with current plan of care       GO                Jeanine Luz., SLP Student 10/30/2020, 12:10 PM

## 2020-10-30 NOTE — Progress Notes (Signed)
[facility] PROGRESS NOTE  ZAFAR DEBROSSE ZOX:[id number] DOB: 1961/02/01 DOA: 07/22/2020 PCP: Patient, No Pcp Per       Status: Remains inpatient appropriate because:Altered mental status and Unsafe d/c plan   Dispo: The patient is from: Home              Anticipated d/c is to: SNF              Anticipated d/c date is: > 3 days              Patient currently is medically stable to d/c.  Barriers to discharge: Medicaid and disability pending. No SNF bed offers.  No further nocturnal agitation or wandering behavior so hopeful will be eligible for SNF bed offer.  Patient able to ambulate 300 feet so hopefully he can eventually discharge home with family as long as they can provide appropriate 24/7 monitoring.  On 2/28 TOC contacted at [facility]   Code Status: Full Family Communication: Updated Sister [name] (who works as a Recruitment consultant from 6 AM to 11 AM).  She is aware that major restriction regarding placement has to do with Medicaid application still pending and many facilities are reluctant to accept a long-term patient without Medicaid in place. DVT prophylaxis: sq heparin Vaccination status: Completed both doses of Pfizer Covid vaccination on 12/14/2019; [place] booster ordered to be given on 3/3 Foley catheter: No  HPI: 60 year old male history of hypertension prescribed Norvasc prior to admission, chronic back pain, prior renal calculi who experienced an out of hospital cardiac arrest with downtime of 9 minutes.  Initial rhythm was ventricular fibrillation.  He was defibrillated deceived 6 doses of epi before ROSC.  Subsequent work-up revealed STEMI.  He underwent cardiac catheterization with a DES placed to the circumflex.  Cardiology following and has placed patient on Brilinta and aspirin and is also being treated with beta-blocker and statins.  From a neurological standpoint he clinically has anoxic brain injury.  During this hospitalization he experienced  AKI and was treated with fluids and correction of hypoperfusion from cardiac arrest.  Echo performed this admission revealed ischemic cardiomyopathy with an EF of 40%  In addition to above patient has been unable to pass swallowing evaluations and subsequently has undergone percutaneous PEG tube placement on 12/30.  Patient is awake and moving all extremities spontaneously but does not follow commands and does not verbalize palliative care has met with patient and family and has emphasized poor prognosis and will continue to meet with the family during the hospitalization.  Current plan is to transition to SNF bed once available.  Expect need for lifelong 24/7 care for all ADLs.  Subjective: Patient sleeping.  Attempted to pull back cover Solik at max.  Was able to look at rational but Mr. Kainz became frustrated and pull covers back over his back.  Did not fully awaken.  Objective: Vitals:   10/29/20 2125 10/30/20 0529  BP: 126/85 123/74  Pulse:  73  Resp:  18  Temp:  97.9 F (36.6 C)  SpO2:  99%    Intake/Output Summary (Last 24 hours) at 10/30/2020 0749 Last data filed at 10/30/2020 0416 Gross per 24 hour  Intake 367 ml  Output 750 ml  Net -383 ml   Filed Weights   10/28/20 0149 10/29/20 0108 10/30/20 0035  Weight: 84.5 kg 84.9 kg 85.6 kg    Exam: Constitutional: Sleeping but appears calm and in no acute distress Heart: Heart sounds were normal with  regular pulse and warm and dry skin Respiratory: Bilateral lung sounds clear to auscultation on posterior exam and he is stable on room air Abdomen: D2 diet, abdomen soft and nontender with normoactive bowel sounds LBM 3/13 Neurologic: CN 2-12 roughly without any appreciable focal neurological deficits, MOE x4, strength is 5/5.   Psychiatric: Asleep, briefly awakened.  Went back to sleep so therefore complete exam not performed. Skin: Previous areas of flat white lesions have essentially resolved except for a small cluster in the mid  back and these lesions are dry and resolving.  Staff have placed barrier cream on back.   Assessment/Plan: Acute problems: Out of hospital cardiac arrest secondary to STEMI -ROSC and subsequent cardiac catheterization revealed dz in circumflex requiring DES -Continue beta-blocker, statin, Plavix, isosorbide and hydralazine  Significant anoxic brain injury secondary to cardiac arrest/deconditioning/gait instability -Consistently participating with PT and OT.  SLP has cleared for dysphagia 2 with thin liquids -Declined by CIR since family unable to provide 24/7 care.  Plan is for SNF -Continue Seroquel 100 mg at at bedtime -2/18 add Seroquel 25 mg every 12 hours as needed for agitation and wandering -Continue daytime Symmetrel  **PT NOTES FROM 3/9**  Pt received in chair, agreeable to therapy session and with good participation and tolerance for mobility, significant other present during session and encouraging. Pt continues to require minA for transfers and household distance ambulation task, pt at times with short/shallow breathing during mobility and orthostatics checked, SBP drop from 160/106 seated to 131/87 standing although pt denies dizziness/LE weakness may not be able to comprehend question due to ABI; VSS otherwise on RA. He continues to make good progress toward goals as updated today by supervising PT Chrys Racer B. Pt continues to benefit from PT services to progress toward functional mobility goals. Continue to recommend SNF.  Orthostatic BPs  Sitting taken in RUE 160/106 (124)   Standing 131/87 (102)  Sitting after standing 143/107 (116)     SLP notes 3/10:   Pt was asleep upon entering the room but became more alert with repositioning and multimodal cues. No overt s/s of aspiration were observed with limited PO trials of thin liquids. SLP attempted trials of solids and more trials of thin liquids, but pt shook his head no despite Max encouragement. Recommend continuation of  his current diet and SLP will continue to follow.    OT notes 3/11: Pt progressing towards acute OT goals. Able to walk from EOB to chair placed in front of sink with min steadying assist and rw. Sat to work on grooming tasks at sink. Impaired cognition remains significant factor in current assist level with ADLs, see details below. Pt was able to correctly identify faces in family photos in the room, big smiles from pt with this activity. Minimal verbalizations this session. Pleasant demeanor. OT goals updated this session.  D/c plan remains appropriate.   White lesions/rash on back and buttocks -Appeared after patient had laying in tube feeding    10/10/20                           10/10/20     10/16/2020     10/20/2020                                     10/20/2020 -As of 3/4 wounds not covered by dressing are drying up and are  clearing.  Have opted to remove wounds covered by dressing to allow to dry up and clear as well.  Situational depression -Started Celexa 2/16-increased to 20 mg 2/18  Ischemic cardiomyopathy -Echo this admission EF 40% -Continue hydralazine to 50 TID and increase Isordil to 20 TID -Continue Carvedilol -No ACE inhibitor secondary to underlying kidney disease -Follow-up echocardiogram done March 2022 demonstrates an EF of 40 to 45% with LVH and grade 1 diastolic dysfunction physiology  Hypertension -Was on Norvasc prior to admission -Continue cardiovascular meds as listed above  Acute on chronic kidney disease stage IIIb -Suspect prerenal etiology in the setting of hypoperfusion from ventricular fibrillation arrest -Baseline creatinine between 1.6 and 2-creatinine as of 3/14 stable at 1.33 -Continue free water per tube and intermittently follow labs  Dysphagia, moderate protein calorie malnutrition Nutrition Problem: Inadequate oral intake Etiology: inability to eat Signs/Symptoms: NPO status  -Continue D2 diet noting patient with improved oral  intake -Discontinue sliding scale insulin since no longer on tube feedings and is not diabetic -Add Ensure beverage twice daily between meals. Interventions: Tube feeding,Prostat Estimated body mass index is 25.59 kg/m as calculated from the following:   Height as of this encounter: 6' (1.829 m).   Weight as of this encounter: 85.6 kg. -3/10 discussed with nutritional team patient has demonstrated improved intake of oral food and fluids.  Previously ordered bolus tube feedings have been discontinued.  We will continue Ensure as above    Other problems: Acute hypernatremia -Continue free water per tube  E. coli UTI -Has completed 5 days of Bactrim  Hyperglycemia without an underlying diagnosis of diabetes mellitus Resolved -Likely related to acute stress -Hemoglobin A1c 4.7   Acute hypoxemic respiratory failure -Resolved -Stable on room air with O2 sats between 97 and 99%  Nonsustained VT -Continue amiodarone and beta-blocker -1/31 LFTs normal  Skin tear right lateral back Wound / Incision (Open or Dehisced) 07/28/20 Skin tear Back Lateral;Right;Upper (Active)  Date First Assessed/Time First Assessed: 07/28/20 0300   Wound Type: Skin tear  Location: Back  Location Orientation: Lateral;Right;Upper  Present on Admission: No    Assessments 07/28/2020  7:49 PM 10/29/2020  8:00 AM  Dressing Type None None  Dressing Status -- Dry;Intact  Drainage Amount None --  Treatment Cleansed --     No Linked orders to display    Data Reviewed: Basic Metabolic Panel: Recent Labs  Lab 10/24/20 0423 10/30/20 0317  NA 139 140  K 4.5 4.2  CL 102 105  CO2 29 26  GLUCOSE 103* 103*  BUN 17 16  CREATININE 1.30* 1.33*  CALCIUM 9.8 9.5   Liver Function Tests: Recent Labs  Lab 10/24/20 0423 10/30/20 0317  AST 28 15  ALT 61* 50*  ALKPHOS 71 71  BILITOT 0.7 0.5  PROT 7.4 7.4  ALBUMIN 3.1* 3.2*   No results for input(s): LIPASE, AMYLASE in the last 168 hours. No results for  input(s): AMMONIA in the last 168 hours. CBC: Recent Labs  Lab 10/30/20 0317  WBC 8.1  HGB 12.6*  HCT 37.7*  MCV 93.8  PLT 220   Cardiac Enzymes: No results for input(s): CKTOTAL, CKMB, CKMBINDEX, TROPONINI in the last 168 hours. BNP (last 3 results) No results for input(s): BNP in the last 8760 hours.  ProBNP (last 3 results) No results for input(s): PROBNP in the last 8760 hours.  CBG: Recent Labs  Lab 10/29/20 0603 10/29/20 1230 10/29/20 1831 10/30/20 0016 10/30/20 0532  GLUCAP 119* 158* 166* 122* 106*  No results found for this or any previous visit (from the past 240 hour(s)).   Studies: No results found.  Scheduled Meds: . (feeding supplement) PROSource Plus  30 mL Oral TID BM  . amantadine  100 mg Oral Daily  . aspirin  81 mg Oral Daily  . atorvastatin  80 mg Oral Daily  . carvedilol  25 mg Oral BID WC  . chlorhexidine gluconate (MEDLINE KIT)  15 mL Mouth Rinse BID  . citalopram  20 mg Oral Daily  . clopidogrel  75 mg Oral Daily  . enoxaparin (LOVENOX) injection  40 mg Subcutaneous Q24H  . feeding supplement  237 mL Oral BID BM  . Gerhardt's butt cream   Topical TID  . hydrALAZINE  50 mg Oral Q8H  . insulin aspart  0-15 Units Subcutaneous Q6H  . isosorbide dinitrate  20 mg Oral TID  . mouth rinse  15 mL Mouth Rinse BID  . polyvinyl alcohol  1 drop Both Eyes QID  . QUEtiapine  25 mg Oral Daily  . sodium chloride flush  3 mL Intravenous Q12H   Continuous Infusions:   Active Problems:   Acute ST elevation myocardial infarction (STEMI) [facility])   Cardiac arrest (HCC)   Elevated blood pressure reading with diagnosis of hypertension   Shock circulatory (HCC)   Encephalopathy   History of ETT   Cardiomyopathy, ischemic   Anoxic brain damage ([place])   Dysphagia   DNR (do not resuscitate) discussion   Gait instability   E. coli UTI   Consultants:  Cardiology  Interventional radiology  PCCM  Neurology  Procedures:  Cardiac catheterization  12/4  Continuous EEG 12/4 and overnight EEG with video 12/5  Echocardiogram 12/5  EEG 12/9  Percutaneous PEG tube placement 12/30  Antibiotics: Anti-infectives (From admission, onward)   Start     Dose/Rate Route Frequency Ordered Stop   10/14/20 1000  fluconazole (DIFLUCAN) tablet 100 mg        100 mg Oral Daily 10/14/20 0848 10/18/20 0932   09/13/20 1000  sulfamethoxazole-trimethoprim (BACTRIM) 200-40 MG/5ML suspension 20 mL        20 mL Per Tube Every 12 hours 09/13/20 0734 09/19/20 0926   09/13/20 0745  cefTRIAXone (ROCEPHIN) 1 g in sodium chloride 0.9 % 100 mL IVPB  Status:  Discontinued        1 g 200 mL/hr over 30 Minutes Intravenous Every 24 hours 09/13/20 0658 09/13/20 0734   08/17/20 0000  ceFAZolin (ANCEF) IVPB 2g/100 mL premix        2 g 200 mL/hr over 30 Minutes Intravenous To Radiology 08/15/20 1346 08/17/20 1111   07/22/20 0830  cefTRIAXone (ROCEPHIN) 2 g in sodium chloride 0.9 % 100 mL IVPB        2 g 200 mL/hr over 30 Minutes Intravenous Every 24 hours 07/22/20 0720 07/26/20 0858       Time spent: 20 minutes    Erin Hearing ANP  [facility] 7 am - 330 pm/M-F for direct patient care and secure chat Please refer to Amion for contact information 100 days

## 2020-10-30 NOTE — TOC Progression Note (Signed)
Transition of Care [facility]) - Progression Note    Patient Details  Name: Antonio Navarro MRN: [id number] Date of Birth: 1960-09-06  Transition of [facility]) CM/SW Contact  Curlene Labrum, RN Phone Number: 10/30/2020, 10:16 AM  Clinical Narrative:    Case management met with the patient at the bedside in regards to transitions of care to an accepting SNF facility.  The patient currently does not have any accepting bed offers at this time with pending Medicaid approval.  I called and left a message with [name], CM at [facility] to check on the availability of accepting bed offer to the facility with LOG needed from [facility].  Waiting for return call from [facility] SNF facility.   Expected Discharge Plan: Skilled Nursing Facility Barriers to Discharge: Continued Medical Work up,No SNF bed  Expected Discharge Plan and Services Expected Discharge Plan: [place] arrangements for the past 2 months: Single Family Home                                       Social Determinants of Health (SDOH) Interventions    Readmission Risk Interventions No flowsheet data found.

## 2020-10-31 ENCOUNTER — Inpatient Hospital Stay (HOSPITAL_COMMUNITY): Payer: Medicaid Other

## 2020-10-31 LAB — GLUCOSE, CAPILLARY
Glucose-Capillary: 105 mg/dL — ABNORMAL HIGH (ref 70–99)
Glucose-Capillary: 110 mg/dL — ABNORMAL HIGH (ref 70–99)
Glucose-Capillary: 155 mg/dL — ABNORMAL HIGH (ref 70–99)

## 2020-10-31 IMAGING — CT CT HEAD W/O CM
3 series · 16 of 47 positions shown, 19 images · non-contrast
Comparison: [DATE]

CLINICAL DATA: Altered mental status.

EXAM:
CT HEAD WITHOUT CONTRAST
TECHNIQUE: Contiguous axial images were obtained from the base of the skull
through the vertex without intravenous contrast.

[Series 3: head 5.0 h30s · axial · 0.46mm/px · z∈[-121,+19]mm · 10 of 34 slices shown, 13 images]
[im 3/34  brain]
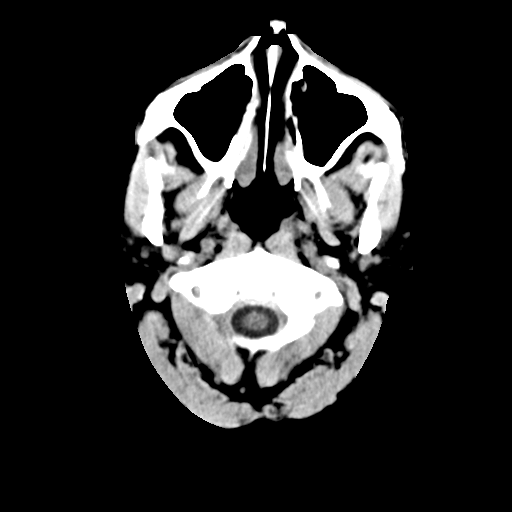
[im 3/34  bone]
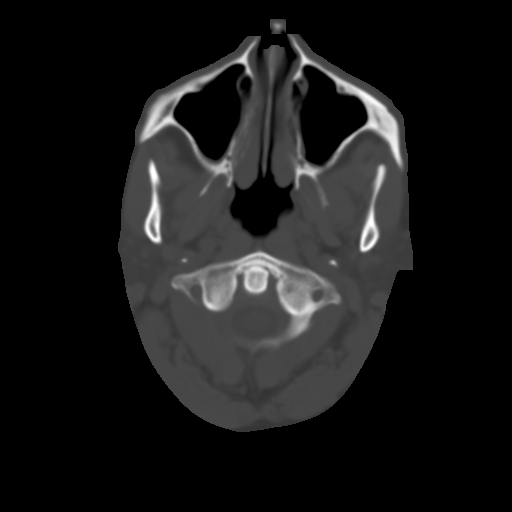
[im 6/34  brain]
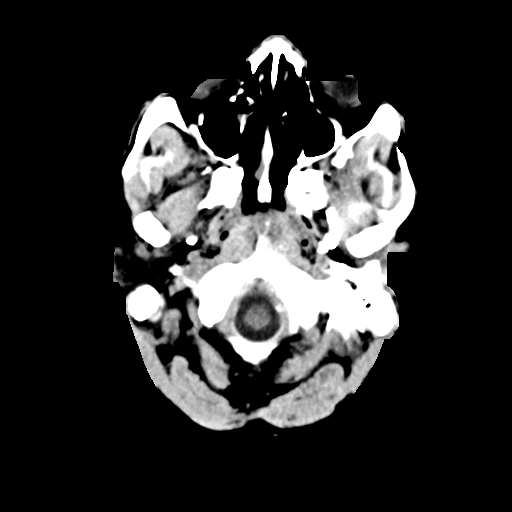
[im 10/34  brain]
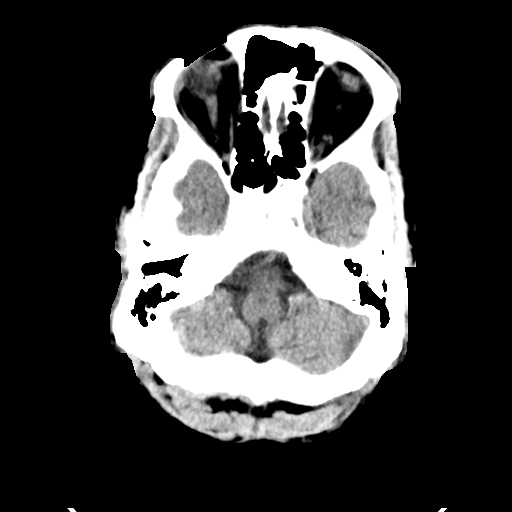
[im 12/34  brain]
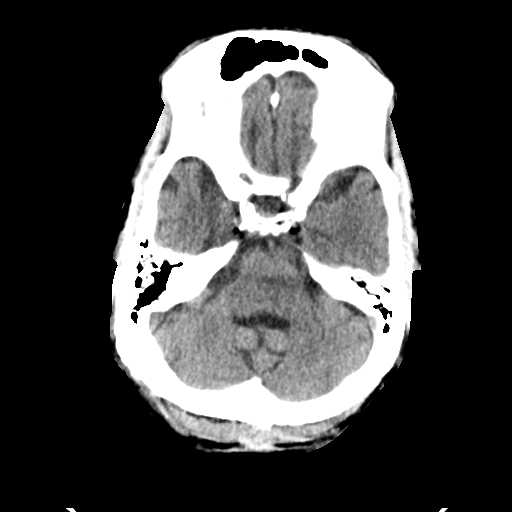
[im 15/34  brain]
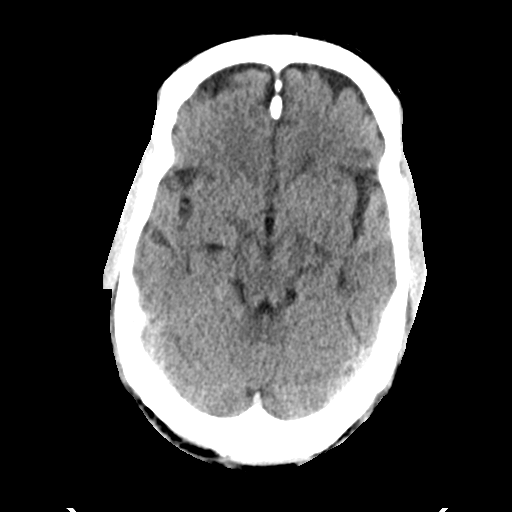
[im 15/34  bone]
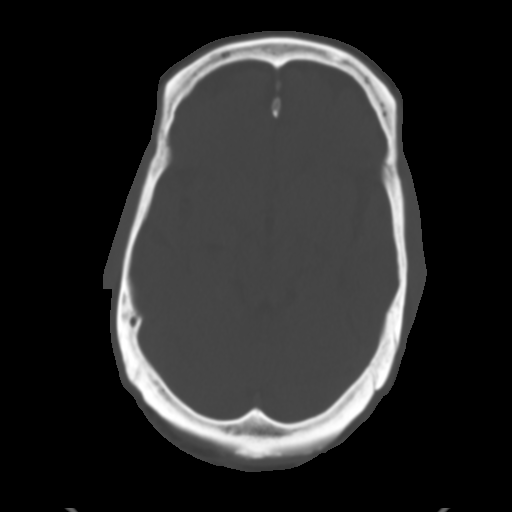
[im 19/34  brain]
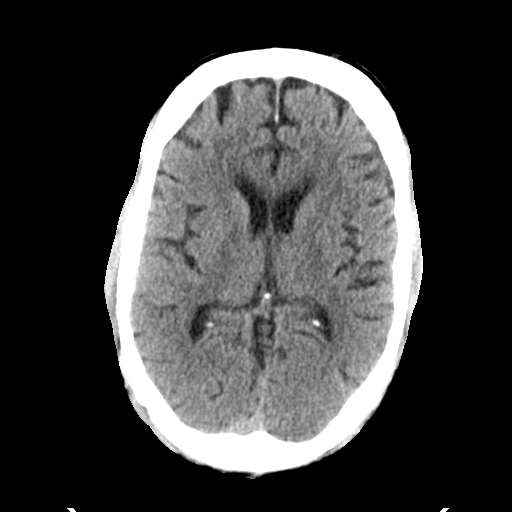
[im 22/34  brain]
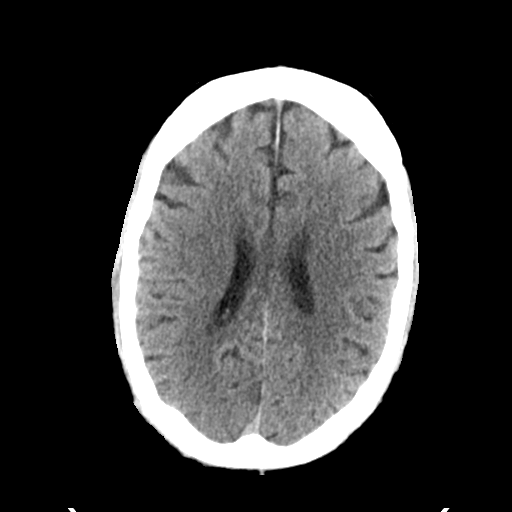
[im 26/34  brain]
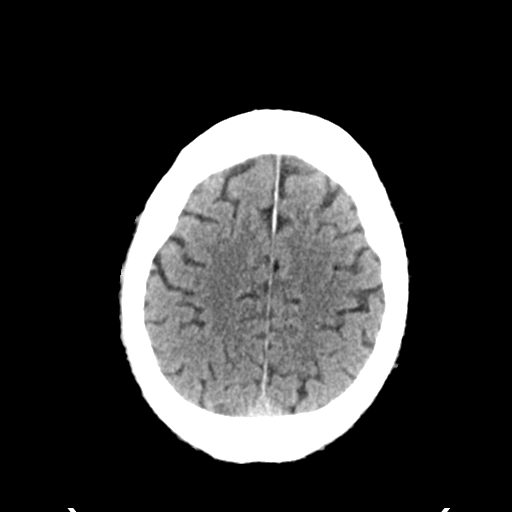
[im 28/34  brain]
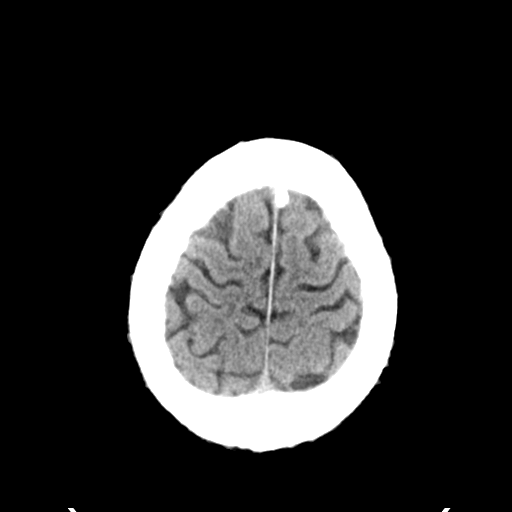
[im 28/34  bone]
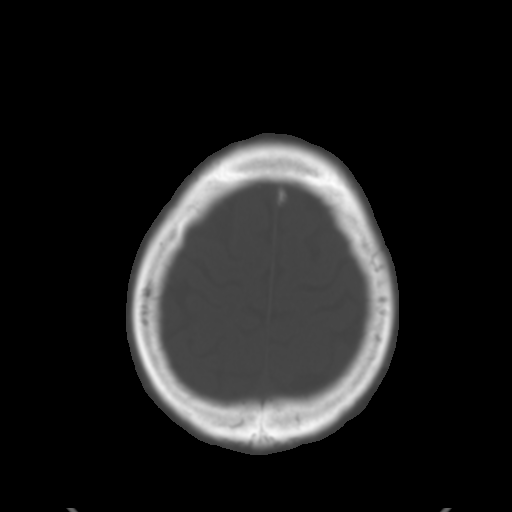
[im 31/34  brain]
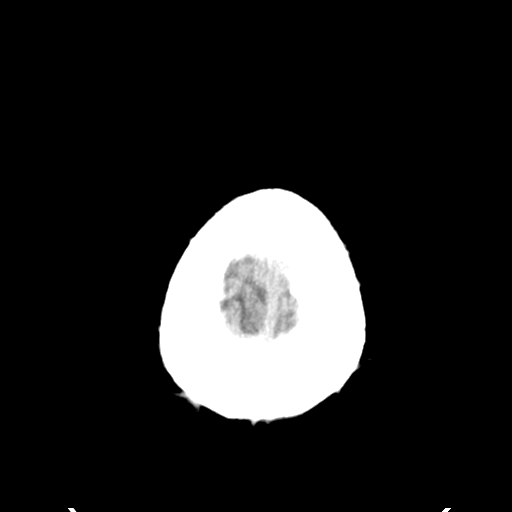

[Series 5: head 3.0 mpr cor · coronal · 0.36mm/px · 3 of 76 slices shown]
[im 26/76  brain]
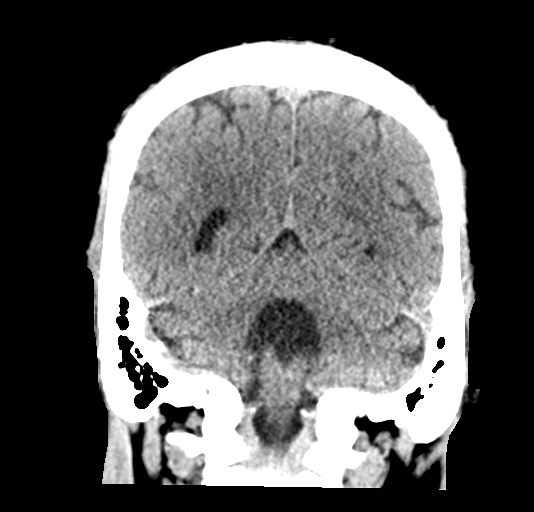
[im 34/76  brain]
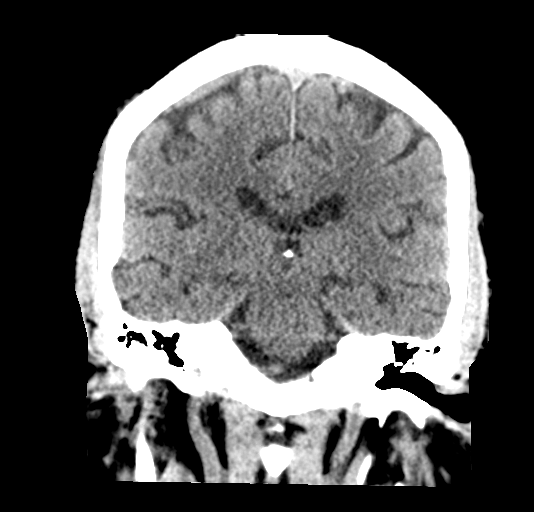
[im 42/76  brain]
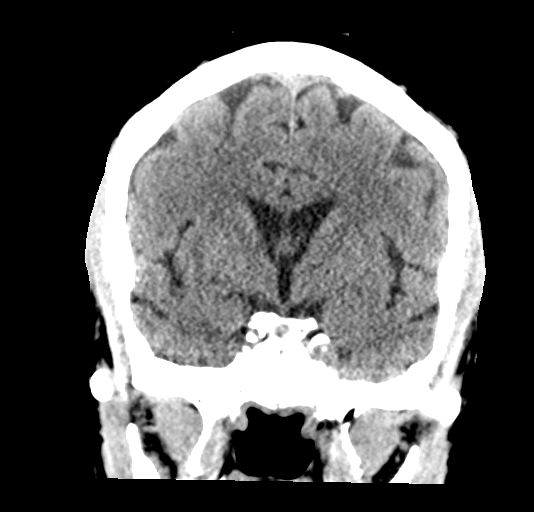

[Series 6: head 3.0 mpr sag · sagittal · 0.37mm/px · 3 of 66 slices shown]
[im 22/66  brain]
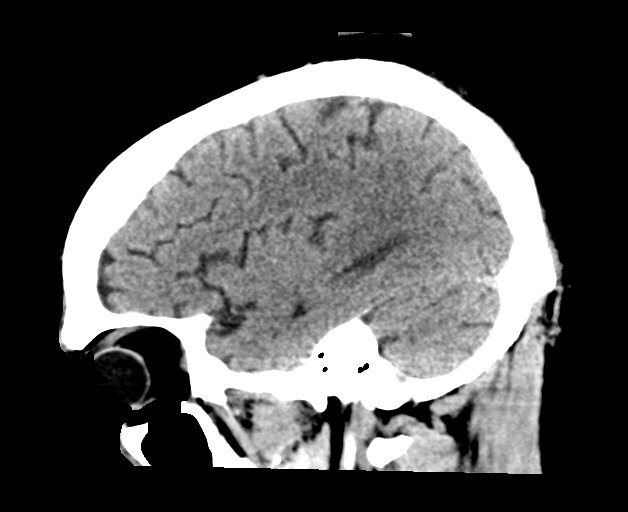
[im 33/66  brain]
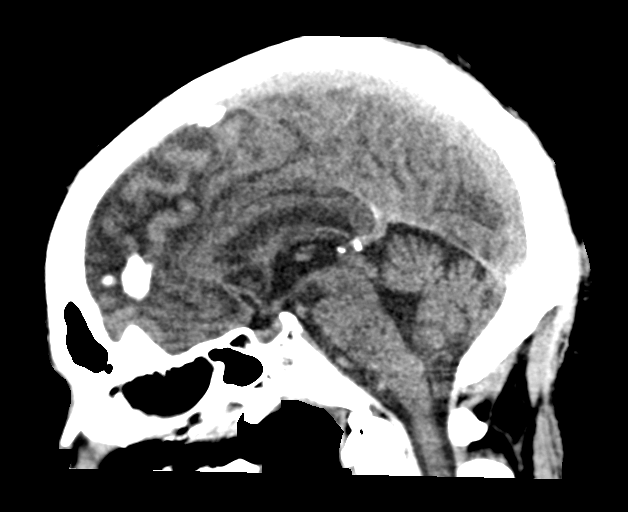
[im 44/66  brain]
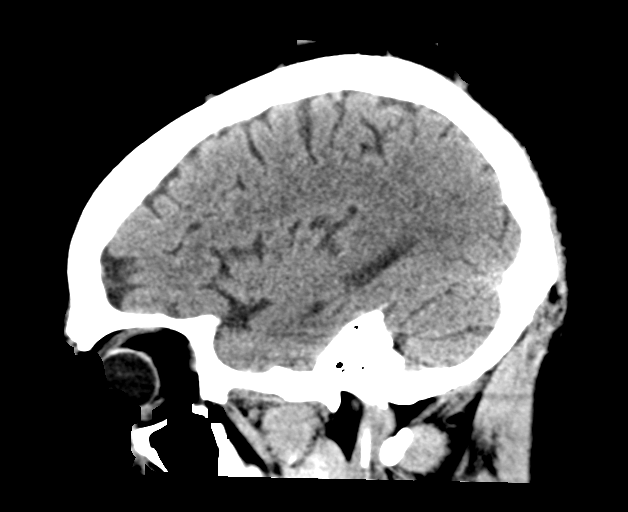

[16 of 47 positions shown; findings below may reference images not displayed]

FINDINGS: Brain: There is mild cerebral atrophy with widening of the
extra-axial spaces and ventricular dilatation.
There are areas of decreased attenuation within the white matter
tracts of the supratentorial brain, consistent with microvascular
disease changes.

Vascular: No hyperdense vessel or unexpected calcification.

Skull: Normal. Negative for fracture or focal lesion.

Sinuses/Orbits: There is mild bilateral maxillary sinus mucosal
thickening.

Other: None.
IMPRESSION: 1. Generalized cerebral atrophy without evidence of acute
intracranial pathology.

## 2020-10-31 NOTE — Progress Notes (Signed)
Patient returned to room from CT. 

## 2020-10-31 NOTE — Plan of Care (Signed)
  Problem: Clinical Measurements: Goal: Ability to maintain clinical measurements within normal limits will improve Outcome: Adequate for Discharge   

## 2020-10-31 NOTE — Progress Notes (Signed)
Patient family just left the room patient was in the chair with chair alarm on, few minutes later secretary called chair alarm going off in 3e26 ran out of 3e27 found patient on the floor. Patient stated he trying to go to the bathroom to BM. Patient sustain bruise on his forehead denies pain no other injury observed, v/s stable. MD and family notified.see epic for CT of head order.

## 2020-10-31 NOTE — Progress Notes (Signed)
CSW attempted to reach patient's Medicaid worker Juanetta Snow [phone]) @ [place] without success - a voicemail was left requesting a return call.  [name], MSW, LCSW Transitions of Care  Clinical Social Worker II [phone]

## 2020-10-31 NOTE — Progress Notes (Signed)
Patient off unit to CT

## 2020-10-31 NOTE — TOC Progression Note (Signed)
Transition of Care [facility]) - Progression Note    Patient Details  Name: Antonio Navarro MRN: [id number] Date of Birth: 1961-02-28  [facility]) CM/SW Contact  Curlene Labrum, RN Phone Number: 10/31/2020, 3:02 PM  Clinical Narrative:    Case management called and spoke with the family, [name], sister at length regarding patient's need for SNF placement - most probably [facility] placement since the patient has existing poor short term and long term cognitive memory issues and need to have 24 hour supervision.  The family is unable to provide 24 hour care for the patient at this time since they are working.  The family was frustrated, understandably, that the patient continues to wait on bed offers after 101 days in the hospital.  I explained that the patient is still waiting on approval of Medicaid application at this point and will need SNF placement through an LOG from [facility].  I explained to the family that the [facility] team is continuing to reach out to numerous facilities to find an appropriate SNF bed for the patient but currently the patient does not have any bed offer.  I spoke with [facility] on the phone today and they were unable to offer the patient a bed offer.  The patient's clinicals were resent to [facility] since the facility has a few open beds for LTC placement today. I updated the family regarding this matter and also reached out to financial counseling to check on the status of the Medicaid application as well.  Support was offered to the family regarding SNF placement process and that [facility] team will continue to seek out bed offers and will update the family accordingly.  CM will continue to follow for SNF placement.   Expected Discharge Plan: Skilled Nursing Facility Barriers to Discharge: Continued Medical Work up,No SNF bed  Expected Discharge Plan and Services Expected Discharge Plan: [place] arrangements for the past  2 months: Single Family Home                                       Social Determinants of Health (SDOH) Interventions    Readmission Risk Interventions No flowsheet data found.

## 2020-10-31 NOTE — Progress Notes (Addendum)
[facility] PROGRESS NOTE  JOSH NICOLOSI SEG:[id number] DOB: [birth year] DOA: 07/22/2020 PCP: Patient, No Pcp Per       Status: Remains inpatient appropriate because:Altered mental status and Unsafe d/c plan   Dispo: The patient is from: Home              Anticipated d/c is to: SNF              Anticipated d/c date is: > 3 days              Patient currently is medically stable to d/c.  Barriers to discharge: Medicaid and disability pending. No SNF bed offers.  No further nocturnal agitation or wandering behavior so hopeful will be eligible for SNF bed offer.  Patient able to ambulate 300 feet so hopefully he can eventually discharge home with family as long as they can provide appropriate 24/7 monitoring.  On 2/28 TOC contacted at [facility]   Code Status: Full Family Communication: 3/15 with both myself and Sharyn Lull Case manager spoke at length to patient's Sister [name] about lack of allergy beds and likelihood of needing to pursue placement in a facility outside of the [place] area. DVT prophylaxis: sq heparin Vaccination status: Completed both doses of Pfizer Covid vaccination on 12/14/2019; [place] booster ordered to be given on 3/3 Foley catheter: No  HPI: 60 year old male history of hypertension prescribed Norvasc prior to admission, chronic back pain, prior renal calculi who experienced an out of hospital cardiac arrest with downtime of 9 minutes.  Initial rhythm was ventricular fibrillation.  He was defibrillated deceived 6 doses of epi before ROSC.  Subsequent work-up revealed STEMI.  He underwent cardiac catheterization with a DES placed to the circumflex.  Cardiology following and has placed patient on Brilinta and aspirin and is also being treated with beta-blocker and statins.  From a neurological standpoint he clinically has anoxic brain injury.  During this hospitalization he experienced AKI and was treated with fluids and correction of  hypoperfusion from cardiac arrest.  Echo performed this admission revealed ischemic cardiomyopathy with an EF of 40%  In addition to above patient has been unable to pass swallowing evaluations and subsequently has undergone percutaneous PEG tube placement on 12/30.  Patient is awake and moving all extremities spontaneously but does not follow commands and does not verbalize palliative care has met with patient and family and has emphasized poor prognosis and will continue to meet with the family during the hospitalization.  Current plan is to transition to SNF bed once available.  Expect need for lifelong 24/7 care for all ADLs.  Subjective: Awake and alert.  Sitting up in chair.  Smiled broadly when staff entered the room today.  Laughed appropriately at jokes.  Easily follow commands.  Attempting to talk but most of his speech is unintelligible although at times some of the words are cohesive and appropriate to conversation at home.  He was able to express that his back is itching at location of previously documented lesions.  Objective: Vitals:   10/31/20 0339 10/31/20 0619  BP: 130/72 129/90  Pulse: 83   Resp: 18   Temp: 98.6 F (37 C)   SpO2: 98%     Intake/Output Summary (Last 24 hours) at 10/31/2020 0745 Last data filed at 10/31/2020 7371 Gross per 24 hour  Intake 140 ml  Output 550 ml  Net -410 ml   Filed Weights   10/29/20 0108 10/30/20 0035 10/31/20 0339  Weight: 84.9 kg 85.6  kg 84.8 kg    Exam: Constitutional: Alert, calm, no acute distress Heart: Normal heart sounds, pulse is regular, extremities warm to touch without peripheral edema Respiratory: Anterior lung sounds are clear, stable on room air Abdomen: D2 diet, soft and nontender.  Normoactive bowel sounds.  PEG in place.  LBM 3/14 Neurologic: CN 2-12 roughly without any appreciable focal neurological deficits, MOE x4, strength is 5/5.   Psychiatric: Alert, oriented times name.  Not to year.  Pleasant. Skin:  Previous areas of flat white lesions have essentially resolved except for a small cluster in the mid back and these lesions are dry and resolving.  Staff have placed barrier cream on back.   Assessment/Plan: Acute problems: Out of hospital cardiac arrest secondary to STEMI -ROSC and subsequent cardiac catheterization revealed dz in circumflex requiring DES -Continue beta-blocker, statin, Plavix, isosorbide and hydralazine  Significant anoxic brain injury secondary to cardiac arrest/deconditioning/gait instability -Consistently participating with PT and OT.  SLP has cleared for dysphagia 2 with thin liquids -Declined by CIR since family unable to provide 24/7 care.  Plan is for SNF -Continue Seroquel 100 mg at at bedtime -2/18 add Seroquel 25 mg every 12 hours as needed for agitation and wandering -Continue daytime Symmetrel  **PT NOTES FROM 3/14**  Making slow progression toward goals.  Emphasis on following simple direction, tranfers to stand, standing balance/sink activity, progression of gait stability/stamina with the RW.   SLP notes 3/14:   Pt was asleep when SLP entered the room but became more alert with repositioning and multimodal cues. He was agreeable to more PO trials today in which no overt s/s of aspiration were noted with liquids or solids; however, prolonged mastication was observed with solids. Pt continues to demonstrate limited verbal expression despite Max multimodal cues, but did nod/shake his head. He repeated the word "off" to indicate that he wanted his lights off at the end of today's session with Max encouragement and verbal cues. SLP will continue to follow.    OT notes 3/11: Pt progressing towards acute OT goals. Able to walk from EOB to chair placed in front of sink with min steadying assist and rw. Sat to work on grooming tasks at sink. Impaired cognition remains significant factor in current assist level with ADLs, see details below. Pt was able to correctly  identify faces in family photos in the room, big smiles from pt with this activity. Minimal verbalizations this session. Pleasant demeanor. OT goals updated this session.  D/c plan remains appropriate.   White lesions/rash on back and buttocks -Appeared after patient had laying in tube feeding    10/10/20                           10/10/20     10/16/2020     10/20/2020                                     10/20/2020 -As of 3/4 wounds not covered by dressing are drying up and are clearing.  Have opted to remove wounds covered by dressing to allow to dry up and clear as well.  Situational depression -Started Celexa 2/16-increased to 20 mg 2/18  Ischemic cardiomyopathy -Echo this admission EF 40% -Continue hydralazine to 50 TID and increase Isordil to 20 TID -Continue Carvedilol -No ACE inhibitor secondary to underlying kidney disease -Follow-up echocardiogram done March 2022  demonstrates an EF of 40 to 45% with LVH and grade 1 diastolic dysfunction physiology  Hypertension -Was on Norvasc prior to admission -Continue cardiovascular meds as listed above  Acute on chronic kidney disease stage IIIb -Suspect prerenal etiology in the setting of hypoperfusion from ventricular fibrillation arrest -Baseline creatinine between 1.6 and 2-creatinine as of 3/14 stable at 1.33 -Continue free water per tube and intermittently follow labs  Dysphagia, moderate protein calorie malnutrition Nutrition Problem: Inadequate oral intake Etiology: inability to eat Signs/Symptoms: NPO status  -Continue D2 diet noting patient with improved oral intake -Discontinue sliding scale insulin since no longer on tube feedings and is not diabetic -Add Ensure beverage twice daily between meals. Interventions: Tube feeding,Prostat Estimated body mass index is 25.35 kg/m as calculated from the following:   Height as of this encounter: 6' (1.829 m).   Weight as of this encounter: 84.8 kg. -3/10 discussed with  nutritional team patient has demonstrated improved intake of oral food and fluids.  Previously ordered bolus tube feedings have been discontinued.  We will continue Ensure as above    Other problems: Acute hypernatremia -Continue free water per tube  E. coli UTI -Has completed 5 days of Bactrim  Hyperglycemia without an underlying diagnosis of diabetes mellitus Resolved -Likely related to acute stress -Hemoglobin A1c 4.7   Acute hypoxemic respiratory failure -Resolved -Stable on room air with O2 sats between 97 and 99%  Nonsustained VT -Continue amiodarone and beta-blocker -1/31 LFTs normal  Skin tear right lateral back Wound / Incision (Open or Dehisced) 07/28/20 Skin tear Back Lateral;Right;Upper (Active)  Date First Assessed/Time First Assessed: 07/28/20 0300   Wound Type: Skin tear  Location: Back  Location Orientation: Lateral;Right;Upper  Present on Admission: No    Assessments 07/28/2020  7:49 PM 10/29/2020  8:00 AM  Dressing Type None None  Dressing Status -- Dry;Intact  Drainage Amount None --  Treatment Cleansed --     No Linked orders to display    Data Reviewed: Basic Metabolic Panel: Recent Labs  Lab 10/30/20 0317  NA 140  K 4.2  CL 105  CO2 26  GLUCOSE 103*  BUN 16  CREATININE 1.33*  CALCIUM 9.5   Liver Function Tests: Recent Labs  Lab 10/30/20 0317  AST 15  ALT 50*  ALKPHOS 71  BILITOT 0.5  PROT 7.4  ALBUMIN 3.2*   No results for input(s): LIPASE, AMYLASE in the last 168 hours. No results for input(s): AMMONIA in the last 168 hours. CBC: Recent Labs  Lab 10/30/20 0317  WBC 8.1  HGB 12.6*  HCT 37.7*  MCV 93.8  PLT 220   Cardiac Enzymes: No results for input(s): CKTOTAL, CKMB, CKMBINDEX, TROPONINI in the last 168 hours. BNP (last 3 results) No results for input(s): BNP in the last 8760 hours.  ProBNP (last 3 results) No results for input(s): PROBNP in the last 8760 hours.  CBG: Recent Labs  Lab 10/30/20 0016  10/30/20 0532 10/30/20 1220 10/30/20 2349 10/31/20 0605  GLUCAP 122* 106* 169* 143* 105*    No results found for this or any previous visit (from the past 240 hour(s)).   Studies: No results found.  Scheduled Meds: . (feeding supplement) PROSource Plus  30 mL Oral TID BM  . amantadine  100 mg Oral Daily  . aspirin  81 mg Oral Daily  . atorvastatin  80 mg Oral Daily  . carvedilol  25 mg Oral BID WC  . chlorhexidine gluconate (MEDLINE KIT)  15 mL Mouth Rinse  BID  . citalopram  20 mg Oral Daily  . clopidogrel  75 mg Oral Daily  . enoxaparin (LOVENOX) injection  40 mg Subcutaneous Q24H  . feeding supplement  237 mL Oral BID BM  . Gerhardt's butt cream   Topical TID  . hydrALAZINE  50 mg Oral Q8H  . isosorbide dinitrate  20 mg Oral TID  . mouth rinse  15 mL Mouth Rinse BID  . polyvinyl alcohol  1 drop Both Eyes QID  . QUEtiapine  25 mg Oral Daily  . sodium chloride flush  3 mL Intravenous Q12H   Continuous Infusions:   Active Problems:   Acute ST elevation myocardial infarction (STEMI) (HCC)   Cardiac arrest (HCC)   Elevated blood pressure reading with diagnosis of hypertension   Shock circulatory (HCC)   Encephalopathy   History of ETT   Cardiomyopathy, ischemic   Anoxic brain damage ([place])   Dysphagia   DNR (do not resuscitate) discussion   Gait instability   E. coli UTI   Essential hypertension   Consultants:  Cardiology  Interventional radiology  PCCM  Neurology  Procedures:  Cardiac catheterization 12/4  Continuous EEG 12/4 and overnight EEG with video 12/5  Echocardiogram 12/5  EEG 12/9  Percutaneous PEG tube placement 12/30  Antibiotics: Anti-infectives (From admission, onward)   Start     Dose/Rate Route Frequency Ordered Stop   10/14/20 1000  fluconazole (DIFLUCAN) tablet 100 mg        100 mg Oral Daily 10/14/20 0848 10/18/20 0932   09/13/20 1000  sulfamethoxazole-trimethoprim (BACTRIM) 200-40 MG/5ML suspension 20 mL        20 mL Per  Tube Every 12 hours 09/13/20 0734 09/19/20 0926   09/13/20 0745  cefTRIAXone (ROCEPHIN) 1 g in sodium chloride 0.9 % 100 mL IVPB  Status:  Discontinued        1 g 200 mL/hr over 30 Minutes Intravenous Every 24 hours 09/13/20 0658 09/13/20 0734   08/17/20 0000  ceFAZolin (ANCEF) IVPB 2g/100 mL premix        2 g 200 mL/hr over 30 Minutes Intravenous To Radiology 08/15/20 1346 08/17/20 1111   07/22/20 0830  cefTRIAXone (ROCEPHIN) 2 g in sodium chloride 0.9 % 100 mL IVPB        2 g 200 mL/hr over 30 Minutes Intravenous Every 24 hours 07/22/20 0720 07/26/20 0858       Time spent: 20 minutes    Erin Hearing ANP  [facility] 7 am - 330 pm/M-F for direct patient care and secure chat Please refer to Amion for contact information 101 days

## 2020-11-01 LAB — GLUCOSE, CAPILLARY
Glucose-Capillary: 110 mg/dL — ABNORMAL HIGH (ref 70–99)
Glucose-Capillary: 125 mg/dL — ABNORMAL HIGH (ref 70–99)
Glucose-Capillary: 148 mg/dL — ABNORMAL HIGH (ref 70–99)

## 2020-11-01 NOTE — Progress Notes (Signed)
[facility] PROGRESS NOTE  Antonio Navarro SWF:[id number] DOB: Jan 06, 1961 DOA: 07/22/2020 PCP: Patient, No Pcp Per       Status: Remains inpatient appropriate because:Altered mental status and Unsafe d/c plan   Dispo: The patient is from: Home              Anticipated d/c is to: SNF              Anticipated d/c date is: > 3 days              Patient currently is medically stable to d/c.  Barriers to discharge: Medicaid and disability pending. No SNF bed offers.  No further nocturnal agitation or wandering behavior so hopeful will be eligible for SNF bed offer.  Patient able to ambulate 300 feet so hopefully he can eventually discharge home with family as long as they can provide appropriate 24/7 monitoring.  On 2/28 TOC contacted at [place] [facility]   Code Status: Full Family Communication: none at bedside DVT prophylaxis: sq heparin Vaccination status: Completed both doses of Pfizer Covid vaccination on 12/14/2019; [name] booster ordered to be given on 3/3 Foley catheter: No  HPI: 60 year old male history of hypertension prescribed Norvasc prior to admission, chronic back pain, prior renal calculi who experienced an out of hospital cardiac arrest with downtime of 9 minutes.  Initial rhythm was ventricular fibrillation.  He was defibrillated deceived 6 doses of epi before ROSC.  Subsequent work-up revealed STEMI.  He underwent cardiac catheterization with a DES placed to the circumflex.  Cardiology following and has placed patient on Brilinta and aspirin and is also being treated with beta-blocker and statins.  From a neurological standpoint he clinically has anoxic brain injury.  During this hospitalization he experienced AKI and was treated with fluids and correction of hypoperfusion from cardiac arrest.  Echo performed this admission revealed ischemic cardiomyopathy with an EF of 40%  In addition to above patient has been unable to pass swallowing evaluations and  subsequently has undergone percutaneous PEG tube placement on 12/30.  Patient is awake and moving all extremities spontaneously but does not follow commands and does not verbalize palliative care has met with patient and family and has emphasized poor prognosis and will continue to meet with the family during the hospitalization.  Current plan is to transition to SNF bed once available.  Expect need for lifelong 24/7 care for all ADLs.  Subjective: Falls asleep during exam. But when wakes up denies sob, cp  Objective: Vitals:   11/01/20 1155 11/01/20 1956  BP: (!) 141/96 118/78  Pulse: 73 83  Resp: 20 18  Temp: (!) 97.4 F (36.3 C) 98 F (36.7 C)  SpO2: 96% 100%    Intake/Output Summary (Last 24 hours) at 11/01/2020 2007 Last data filed at 11/01/2020 0500 Gross per 24 hour  Intake 0 ml  Output 375 ml  Net -375 ml   Filed Weights   10/30/20 0035 10/31/20 0339 11/01/20 0321  Weight: 85.6 kg 84.8 kg 84.6 kg    Exam: nad Sleepy cta no w/r/r Regular s1/s2  Soft benign +bs No edema   Assessment/Plan: Acute problems: Out of hospital cardiac arrest secondary to STEMI ROSC and subsequent cardiac cath revealed cx dz, s/p DES Continue beta blk, statin, plavix, isosorbide and hydralazine    Significant anoxic brain injury secondary to cardiac arrest/deconditioning/gait instability -Consistently participating with PT and OT.  SLP has cleared for dysphagia 2 with thin liquids -Declined by CIR since family unable  to provide 24/7 care.  Plan is for SNF -Continue Seroquel 100 mg at at bedtime -2/18 add Seroquel 25 mg every 12 hours as needed for agitation and wandering -Continue daytime Symmetrel  **PT NOTES FROM 3/14**  Making slow progression toward goals.  Emphasis on following simple direction, tranfers to stand, standing balance/sink activity, progression of gait stability/stamina with the RW.   SLP notes 3/14:   Pt was asleep when SLP entered the room but became more  alert with repositioning and multimodal cues. He was agreeable to more PO trials today in which no overt s/s of aspiration were noted with liquids or solids; however, prolonged mastication was observed with solids. Pt continues to demonstrate limited verbal expression despite Max multimodal cues, but did nod/shake his head. He repeated the word "off" to indicate that he wanted his lights off at the end of today's session with Max encouragement and verbal cues. SLP will continue to follow.    OT notes 3/11: Pt progressing towards acute OT goals. Able to walk from EOB to chair placed in front of sink with min steadying assist and rw. Sat to work on grooming tasks at sink. Impaired cognition remains significant factor in current assist level with ADLs, see details below. Pt was able to correctly identify faces in family photos in the room, big smiles from pt with this activity. Minimal verbalizations this session. Pleasant demeanor. OT goals updated this session.  D/c plan remains appropriate.   White lesions/rash on back and buttocks -Appeared after patient had laying in tube feeding    10/10/20                           10/10/20     10/16/2020     10/20/2020                                     10/20/2020 -As of 3/4 wounds not covered by dressing are drying up and are clearing.  Have opted to remove wounds covered by dressing to allow to dry up and clear as well.  Situational depression -Started Celexa 2/16-increased to 20 mg 2/18  Ischemic cardiomyopathy -Echo this admission EF 40% -Continue hydralazine to 50 TID and increase Isordil to 20 TID -Continue Carvedilol -No ACE inhibitor secondary to underlying kidney disease -Follow-up echocardiogram done March 2022 demonstrates an EF of 40 to 45% with LVH and grade 1 diastolic dysfunction physiology  Hypertension -Was on Norvasc prior to admission -Continue cardiovascular meds as listed above  Acute on chronic kidney disease stage IIIb -Suspect  prerenal etiology in the setting of hypoperfusion from ventricular fibrillation arrest -Baseline creatinine between 1.6 and 2-creatinine as of 3/14 stable at 1.33 -Continue free water per tube and intermittently follow labs  Dysphagia, moderate protein calorie malnutrition Nutrition Problem: Inadequate oral intake Etiology: inability to eat Signs/Symptoms: NPO status  -Continue D2 diet noting patient with improved oral intake -Discontinue sliding scale insulin since no longer on tube feedings and is not diabetic -Add Ensure beverage twice daily between meals. Interventions: Tube feeding,Prostat Estimated body mass index is 25.3 kg/m as calculated from the following:   Height as of this encounter: 6' (1.829 m).   Weight as of this encounter: 84.6 kg. -3/10 discussed with nutritional team patient has demonstrated improved intake of oral food and fluids.  Previously ordered bolus tube feedings have been discontinued.  We  will continue Ensure as above    Other problems: Acute hypernatremia -Continue free water per tube  E. coli UTI -Has completed 5 days of Bactrim  Hyperglycemia without an underlying diagnosis of diabetes mellitus Resolved -Likely related to acute stress -Hemoglobin A1c 4.7   Acute hypoxemic respiratory failure -Resolved -Stable on room air with O2 sats between 97 and 99%  Nonsustained VT -Continue amiodarone and beta-blocker -1/31 LFTs normal  Skin tear right lateral back Wound / Incision (Open or Dehisced) 07/28/20 Skin tear Back Lateral;Right;Upper (Active)  Date First Assessed/Time First Assessed: 07/28/20 0300   Wound Type: Skin tear  Location: Back  Location Orientation: Lateral;Right;Upper  Present on Admission: No    Assessments 07/28/2020  7:49 PM 10/29/2020  8:00 AM  Dressing Type None None  Dressing Status - Dry;Intact  Drainage Amount None -  Treatment Cleansed -     No Linked orders to display    Data Reviewed: Basic Metabolic  Panel: Recent Labs  Lab 10/30/20 0317  NA 140  K 4.2  CL 105  CO2 26  GLUCOSE 103*  BUN 16  CREATININE 1.33*  CALCIUM 9.5   Liver Function Tests: Recent Labs  Lab 10/30/20 0317  AST 15  ALT 50*  ALKPHOS 71  BILITOT 0.5  PROT 7.4  ALBUMIN 3.2*   No results for input(s): LIPASE, AMYLASE in the last 168 hours. No results for input(s): AMMONIA in the last 168 hours. CBC: Recent Labs  Lab 10/30/20 0317  WBC 8.1  HGB 12.6*  HCT 37.7*  MCV 93.8  PLT 220   Cardiac Enzymes: No results for input(s): CKTOTAL, CKMB, CKMBINDEX, TROPONINI in the last 168 hours. BNP (last 3 results) No results for input(s): BNP in the last 8760 hours.  ProBNP (last 3 results) No results for input(s): PROBNP in the last 8760 hours.  CBG: Recent Labs  Lab 10/31/20 1201 10/31/20 1614 11/01/20 0625 11/01/20 1151 11/01/20 1546  GLUCAP 110* 155* 110* 125* 148*    No results found for this or any previous visit (from the past 240 hour(s)).   Studies: CT HEAD WO CONTRAST  Result Date: 10/31/2020 CLINICAL DATA:  Altered mental status. EXAM: CT HEAD WITHOUT CONTRAST TECHNIQUE: Contiguous axial images were obtained from the base of the skull through the vertex without intravenous contrast. COMPARISON:  July 22, 2020 FINDINGS: Brain: There is mild cerebral atrophy with widening of the extra-axial spaces and ventricular dilatation. There are areas of decreased attenuation within the white matter tracts of the supratentorial brain, consistent with microvascular disease changes. Vascular: No hyperdense vessel or unexpected calcification. Skull: Normal. Negative for fracture or focal lesion. Sinuses/Orbits: There is mild bilateral maxillary sinus mucosal thickening. Other: None. IMPRESSION: 1. Generalized cerebral atrophy without evidence of acute intracranial pathology. Electronically Signed   By: Virgina Norfolk M.D.   On: 10/31/2020 21:12    Scheduled Meds: . (feeding supplement) PROSource  Plus  30 mL Oral TID BM  . amantadine  100 mg Oral Daily  . aspirin  81 mg Oral Daily  . atorvastatin  80 mg Oral Daily  . carvedilol  25 mg Oral BID WC  . chlorhexidine gluconate (MEDLINE KIT)  15 mL Mouth Rinse BID  . citalopram  20 mg Oral Daily  . clopidogrel  75 mg Oral Daily  . enoxaparin (LOVENOX) injection  40 mg Subcutaneous Q24H  . feeding supplement  237 mL Oral BID BM  . Gerhardt's butt cream   Topical TID  . hydrALAZINE  50  mg Oral Q8H  . isosorbide dinitrate  20 mg Oral TID  . mouth rinse  15 mL Mouth Rinse BID  . polyvinyl alcohol  1 drop Both Eyes QID  . QUEtiapine  25 mg Oral Daily  . sodium chloride flush  3 mL Intravenous Q12H   Continuous Infusions:   Active Problems:   Acute ST elevation myocardial infarction (STEMI) (HCC)   Cardiac arrest (HCC)   Elevated blood pressure reading with diagnosis of hypertension   Shock circulatory (HCC)   Encephalopathy   History of ETT   Cardiomyopathy, ischemic   Anoxic brain damage ([place])   Dysphagia   DNR (do not resuscitate) discussion   Gait instability   E. coli UTI   Essential hypertension   Consultants:  Cardiology  Interventional radiology  PCCM  Neurology  Procedures:  Cardiac catheterization 12/4  Continuous EEG 12/4 and overnight EEG with video 12/5  Echocardiogram 12/5  EEG 12/9  Percutaneous PEG tube placement 12/30  Antibiotics: Anti-infectives (From admission, onward)   Start     Dose/Rate Route Frequency Ordered Stop   10/14/20 1000  fluconazole (DIFLUCAN) tablet 100 mg        100 mg Oral Daily 10/14/20 0848 10/18/20 0932   09/13/20 1000  sulfamethoxazole-trimethoprim (BACTRIM) 200-40 MG/5ML suspension 20 mL        20 mL Per Tube Every 12 hours 09/13/20 0734 09/19/20 0926   09/13/20 0745  cefTRIAXone (ROCEPHIN) 1 g in sodium chloride 0.9 % 100 mL IVPB  Status:  Discontinued        1 g 200 mL/hr over 30 Minutes Intravenous Every 24 hours 09/13/20 0658 09/13/20 0734   08/17/20 0000   ceFAZolin (ANCEF) IVPB 2g/100 mL premix        2 g 200 mL/hr over 30 Minutes Intravenous To Radiology 08/15/20 1346 08/17/20 1111   07/22/20 0830  cefTRIAXone (ROCEPHIN) 2 g in sodium chloride 0.9 % 100 mL IVPB        2 g 200 mL/hr over 30 Minutes Intravenous Every 24 hours 07/22/20 0720 07/26/20 0858       Time spent: 20 minutes    Nolberto Hanlon MD [facility] 7 am - 330 pm/M-F for direct patient care and secure chat Please refer to [place] for contact information 102 days

## 2020-11-01 NOTE — TOC Progression Note (Addendum)
Transition of Care [facility]) - Progression Note    Patient Details  Name: OLIN GURSKI MRN: [id number] Date of Birth: Sep 28, 1960  Transition of Care [facility]) CM/SW [place], RN Phone Number: 11/01/2020, 9:59 AM  Clinical Narrative:    Case management spoke with Ebony Hail, [place] with [facility] this morning and the facility is unable to extend a bed offer to the patient until his Medicaid application and disability are approved.  The facility is unable to accept an LOG for admission to the facility at this time.  I called and spoke with [facility] financial counseling this morning and the patient's disability is still pending and Medicaid approval is still pending at this point.  I called and updated the daughter, [name], on the phone in regards to no bed offers for admission at this point and [facility] was unable to offer a bed at this time.  CM and MSW with DTP team are still continuing to seek admission opportunities for this patient with facilities that are willing to accept an LOG with admission.  The family is aware that admission may include transfer to a facility outside of the area if needed until the patient's Medicaid and disability are approved.  11/01/2020 - CM spoke with Elana Alm, TOC supervisior to see if he could speak with corporate contact at [facility] to see if the facility was willing to entertain admission to facility under an LOG from [facility] for [facility] placement.  Expected Discharge Plan: Skilled Nursing Facility Barriers to Discharge: Continued Medical Work up,No SNF bed  Expected Discharge Plan and Services Expected Discharge Plan: [place] arrangements for the past 2 months: Single Family Home                                       Social Determinants of Health (SDOH) Interventions    Readmission Risk Interventions No flowsheet data found.

## 2020-11-01 NOTE — Progress Notes (Signed)
Physical Therapy Treatment Patient Details Name: Antonio Navarro MRN: [id number] DOB: Nov 28, 1960 Today's Date: 11/01/2020    History of Present Illness [name] admitted 07/22/20 post cardiac arrest with inferolateral STEMI. He was resuscitated in the field by EMS after being found to be in V fib. Approx 30 minutes of CPR. He was intubated on arrival.  Pt with anoxic brain injury. PEG placed on 08/17/20. PMH: tobacco abuse, obesity, HTN.    PT Comments    Pt slow to respond to commands/questions.  Needing encouragement to appreciate the need to get out of his wet (urined) bed.  Emphasis on warm up exercise, transitions, sit to stand, progressing gait stability and following commands with less need for associated cuing.    Follow Up Recommendations  SNF;Supervision/Assistance - 24 hour     Equipment Recommendations  Rolling walker with 5" wheels;3in1 (PT);Wheelchair (measurements PT);Wheelchair cushion (measurements PT)    Recommendations for Other Services       Precautions / Restrictions Precautions Precautions: Fall    Mobility  Bed Mobility Overal bed mobility: Needs Assistance Bed Mobility: Supine to Sit     Supine to sit: Min assist     General bed mobility comments: cues and assist to initiate getting OOB, min assist once pt non verbally agree to getting up.    Transfers Overall transfer level: Needs assistance Equipment used: Rolling walker (2 wheeled) Transfers: Sit to/from Stand Sit to Stand: Min assist         General transfer comment: cues for hand placement, assist to come forward and stay forward, minimal boost.  Ambulation/Gait Ambulation/Gait assistance: Min assist   Assistive device: Rolling walker (2 wheeled) Gait Pattern/deviations: Step-through pattern Gait velocity: reduced Gait velocity interpretation: <1.8 ft/sec, indicate of risk for recurrent falls General Gait Details: narrowed BOS, drifting and deviations occurring when pt loses  focus   Stairs             Wheelchair Mobility    Modified Rankin (Stroke Patients Only)       Balance     Sitting balance-Leahy Scale: Fair       Standing balance-Leahy Scale: Poor Standing balance comment: Needed minimal stability support standing at the sink or with 1 hand supporting while pt brushed his teeth.  Pt knee the toothbrush was for his teeth, but initially had trouble manipulating the toothbrush until place appropriately in the R hand.  Pt needed direction/redirection for general technique and thoroughness.                            Cognition Arousal/Alertness: Awake/alert Behavior During Therapy: WFL for tasks assessed/performed;Flat affect Overall Cognitive Status: Impaired/Different from baseline Area of Impairment: Attention;Memory;Following commands;Safety/judgement;Awareness;Problem solving               Rancho Levels of Cognitive Functioning Rancho Los Amigos Scales of Cognitive Functioning: Confused/appropriate Orientation Level: Situation;Time Current Attention Level: Sustained Memory: Decreased short-term memory Following Commands: Follows one step commands with increased time Safety/Judgement: Decreased awareness of safety;Decreased awareness of deficits Awareness: Intellectual Problem Solving: Slow processing;Requires verbal cues        Exercises Other Exercises Other Exercises: warm up knee/hip flexion ext  A/Resistive ROM bil x10 Other Exercises: bicep/tricep presses bil aa to resistive ROM x10 as warm up.    General Comments        Pertinent Vitals/Pain Pain Assessment: Faces Faces Pain Scale: No hurt Pain Intervention(s): Monitored during session  Home Living                      Prior Function            PT Goals (current goals can now be found in the care plan section) Acute Rehab PT Goals Patient Stated Goal: to get better PT Goal Formulation: With patient Time For Goal Achievement:  11/08/20 Potential to Achieve Goals: Fair Progress towards PT goals: Progressing toward goals    Frequency    Min 3X/week      PT Plan Current plan remains appropriate    Co-evaluation              AM-PAC PT "6 Clicks" Mobility   Outcome Measure  Help needed turning from your back to your side while in a flat bed without using bedrails?: A Little Help needed moving from lying on your back to sitting on the side of a flat bed without using bedrails?: A Little Help needed moving to and from a bed to a chair (including a wheelchair)?: A Little Help needed standing up from a chair using your arms (e.g., wheelchair or bedside chair)?: A Little Help needed to walk in hospital room?: A Little Help needed climbing 3-5 steps with a railing? : A Little 6 Click Score: 18    End of Session   Activity Tolerance: Patient tolerated treatment well;Other (comment) Patient left: in chair;with call bell/phone within reach;with chair alarm set Nurse Communication: Mobility status PT Visit Diagnosis: Other abnormalities of gait and mobility (R26.89);Difficulty in walking, not elsewhere classified (R26.2)     Time: 9675-9163 PT Time Calculation (min) (ACUTE ONLY): 28 min  Charges:  $Gait Training: 8-22 mins $Therapeutic Exercise: 8-22 mins                     11/01/2020  Antonio Carne., PT Acute Rehabilitation Services [phone]  (pager) [phone]  (office)   Antonio Navarro 11/01/2020, 12:02 PM

## 2020-11-01 NOTE — Progress Notes (Signed)
OT Cancellation Note  Patient Details Name: SIVAN QUAST MRN: [id number] DOB: [birth year]   Cancelled Treatment:    Reason Eval/Treat Not Completed: Fatigue/lethargy limiting ability to participate;Other (comment) Checked on pt x2 this afternoon with pt asleep, will continue efforts as time allows.  Corinne Ports K., COTA/L Acute Rehabilitation Services [phone]   Precious Haws 11/01/2020, 3:22 PM

## 2020-11-02 LAB — GLUCOSE, CAPILLARY
Glucose-Capillary: 115 mg/dL — ABNORMAL HIGH (ref 70–99)
Glucose-Capillary: 129 mg/dL — ABNORMAL HIGH (ref 70–99)
Glucose-Capillary: 166 mg/dL — ABNORMAL HIGH (ref 70–99)

## 2020-11-02 MED ORDER — ADULT MULTIVITAMIN W/MINERALS CH
1.0000 | ORAL_TABLET | Freq: Every day | ORAL | Status: DC
  Administered 2020-11-02 – 2020-11-24 (×20): 1 via ORAL
  Filled 2020-11-02 (×21): qty 1

## 2020-11-02 NOTE — Progress Notes (Signed)
CSW received return call from patient's Medicaid worker Juanetta Snow - no further action necessary, waiting on disability determination before Medicaid is approved.  [name], MSW, LCSW Transitions of Care  Clinical Social Worker II [phone]

## 2020-11-02 NOTE — Progress Notes (Signed)
Nutrition Follow-up  DOCUMENTATION CODES:   Not applicable  INTERVENTION:   -MVI with minerals po daily   -Continue Ensure Enlive po BID, each supplement provides 350 kcal, 20 grams protein   -Continue Magic cup TID with meals, each supplement provides 290 kcal, 9 grams protein   -Continue 30 ml Prosource Plus TID, each supplement provides 100 kcals, 15 grams protein   -Continue feeding assistance with meals  NUTRITION DIAGNOSIS:   Increased nutrient needs related to chronic illness (CKD3b) as evidenced by estimated needs.  GOAL:   Patient will meet greater than or equal to 90% of their needs  MONITOR:   PO intake,Supplement acceptance,Skin,I & O's,Labs,Weight trends  REASON FOR ASSESSMENT:   Consult,Ventilator Enteral/tube feeding initiation and management  ASSESSMENT:   Patient with PMH significant for HTN and kidney stones. Presents this admission s/p cardiac arrest. Recent onset of CKD3b, dysphagia.   Per MD note, pt's oral intake has improved on D2 diet with supplements, bolus tube feeds d/c 3/10. Palliative care has met with pt and family and emphasized poor prognosis, current plan is to transition to SNF bed once available.   Per chart meal documentation, pt has been consuming 0-100% of meals. Per last RD note, pt has never consistently eaten breakfast, but consistently eats well at lunch and dinner with assistance. Omitting breakfasts, pt meal documentation has been 75-100%. Spoke with pt and pt's sister who mentioned pt has been eating well and has continued to eat more than 50% of his trays. However she mentioned pt didn't seem as hungry today and was not wanting as much of his meals. Pt's sister reports that pt has enjoyed his nutrition supplements and consumes most of them.   Pt's weights reviewed and have been stable the past month. Admit weight: 224.87#  Current weight: 187.17#  Meds reviewed: ProSource (TID), Ensure Enlive (BID) Labs reviewed: CBG  (110-148)  I&O's reviewed: -758 L since 10/19/20 UOP: 300 mL x 24 hrs  NUTRITION - FOCUSED PHYSICAL EXAM: Flowsheet Row Most Recent Value  Orbital Region No depletion  Upper Arm Region No depletion  Thoracic and Lumbar Region No depletion  Buccal Region No depletion  Temple Region Mild depletion  Clavicle Bone Region Mild depletion  Clavicle and Acromion Bone Region Mild depletion  Scapular Bone Region No depletion  Dorsal Hand Mild depletion  Patellar Region Mild depletion  Anterior Thigh Region Moderate depletion  Posterior Calf Region Mild depletion  Edema (RD Assessment) None  Hair Reviewed  Eyes Reviewed  Mouth Reviewed  Skin Reviewed  Nails Reviewed      Diet Order:   Diet Order            DIET DYS 2 Room service appropriate? No; Fluid consistency: Thin  Diet effective now                 EDUCATION NEEDS:   No education needs have been identified at this time  Skin:  Skin Assessment: Skin Integrity Issues: Skin Integrity Issues:: Other (Comment) Other: Skin tear R upper back, L buttocks rash  Last BM:  3/15 (type 4)  Height:   Ht Readings from Last 1 Encounters:  09/03/20 6' (1.829 m)    Weight:   Wt Readings from Last 1 Encounters:  11/02/20 84.9 kg    Ideal Body Weight:  80.9 kg  BMI:  Body mass index is 25.38 kg/m.  Estimated Nutritional Needs:   Kcal:  2300-2500 kcal  Protein:  115-130 grams  Fluid:  >/=  2 L/day    Salvadore Oxford, Dietetic Intern 11/02/2020 2:47 PM

## 2020-11-02 NOTE — Progress Notes (Addendum)
  Speech Language Pathology Treatment: Dysphagia;Cognitive-Linquistic  Patient Details Name: Antonio Navarro MRN: [id number] DOB: 1961-01-08 Today's Date: 11/02/2020 Time: 0300-9233 SLP Time Calculation (min) (ACUTE ONLY): 18 min  Assessment / Plan / Recommendation Clinical Impression  Pt consumed advanced trials of solids with mastication time that appeared to increase as trials continued. He needed Min cues to sustain attention to mastication and to use liquid washes to help clear his oral cavity. At times, there was immediate, weak coughing noted after these liquid washes. Anterior spillage was also noted during these moments when he appeared to have reduced oral control with mixed consistencies. Pt initiated self-feeding a little more today, especially with grabbing for his bottle of water, although he still benefited from Min-Mod cues for this. Pt continues to require fairly consistent cues for initiation of any verbal output with the exception today of a social greeting. Goals updated today - given pt's ongoing, but slow progress, will continue to follow but with frequency adjusted to 1x/week.    HPI HPI: Pt is a 60 yo male s/p arrest with downtime 9 minutes. Initial rhythm was V. fib, shocked and received 6 rounds of epi before ROSC achieved.  Total CPR time was 30 minutes. ETT 12/4-12/13. MRI 12/7 suggestive of a widespread anoxic injury. PMH includes kidney stones and HTN.      SLP Plan  Continue with current plan of care       Recommendations  Diet recommendations: Dysphagia 2 (fine chop);Thin liquid Liquids provided via: Cup;Straw Medication Administration: Crushed with puree Supervision: Staff to assist with self feeding;Full supervision/cueing for compensatory strategies Compensations: Slow rate;Small sips/bites;Minimize environmental distractions Postural Changes and/or Swallow Maneuvers: Seated upright 90 degrees                Oral Care Recommendations: Oral care  BID Follow up Recommendations: Skilled Nursing facility SLP Visit Diagnosis: Dysphagia, unspecified (R13.10);Cognitive communication deficit (R41.841) Plan: Continue with current plan of care       GO                Osie Bond., M.A. [place] [facility] Pager [phone] Office [phone]  11/02/2020, 11:47 AM

## 2020-11-02 NOTE — Progress Notes (Signed)
[facility] PROGRESS NOTE  Antonio Navarro GGY:[id number] DOB: 1960-11-12 DOA: 07/22/2020 PCP: Patient, No Pcp Per       Status: Remains inpatient appropriate because:Altered mental status and Unsafe d/c plan   Dispo: The patient is from: Home              Anticipated d/c is to: SNF              Anticipated d/c date is: > 3 days              Patient currently is medically stable to d/c.  Barriers to discharge: Medicaid and disability pending. No SNF bed offers.  No further nocturnal agitation or wandering behavior so hopeful will be eligible for SNF bed offer.  Patient able to ambulate 300 feet so hopefully he can eventually discharge home with family as long as they can provide appropriate 24/7 monitoring.  On 2/28 TOC contacted at [facility]   Code Status: Full Family Communication: none at bedside DVT prophylaxis: sq heparin Vaccination status: Completed both doses of Pfizer Covid vaccination on 12/14/2019; [place] booster ordered to be given on 3/3 Foley catheter: No  HPI: 60 year old male history of hypertension prescribed Norvasc prior to admission, chronic back pain, prior renal calculi who experienced an out of hospital cardiac arrest with downtime of 9 minutes.  Initial rhythm was ventricular fibrillation.  He was defibrillated deceived 6 doses of epi before ROSC.  Subsequent work-up revealed STEMI.  He underwent cardiac catheterization with a DES placed to the circumflex.  Cardiology following and has placed patient on Brilinta and aspirin and is also being treated with beta-blocker and statins.  From a neurological standpoint he clinically has anoxic brain injury.  During this hospitalization he experienced AKI and was treated with fluids and correction of hypoperfusion from cardiac arrest.  Echo performed this admission revealed ischemic cardiomyopathy with an EF of 40%  In addition to above patient has been unable to pass swallowing evaluations and  subsequently has undergone percutaneous PEG tube placement on 12/30.  Patient is awake and moving all extremities spontaneously but does not follow commands and does not verbalize palliative care has met with patient and family and has emphasized poor prognosis and will continue to meet with the family during the hospitalization.  Current plan is to transition to SNF bed once available.  Expect need for lifelong 24/7 care for all ADLs.  Subjective: Pt more awake today. Nods his head to my questions . No complaints reported  Objective: Vitals:   11/02/20 1222 11/02/20 1733  BP: 116/76 (!) 165/124  Pulse: 81 79  Resp: 18   Temp: 98.5 F (36.9 C)   SpO2: 98%     Intake/Output Summary (Last 24 hours) at 11/02/2020 1804 Last data filed at 11/02/2020 1548 Gross per 24 hour  Intake 500 ml  Output 600 ml  Net -100 ml   Filed Weights   10/31/20 0339 11/01/20 0321 11/02/20 0500  Weight: 84.8 kg 84.6 kg 84.9 kg    Exam: Calm, nad cta no w/r/r Regular s1/s2 Soft benign +bs No edema Awake , alert    Assessment/Plan: Acute problems: Out of hospital cardiac arrest secondary to STEMI ROSC and subsequent cardiac cath revealed Cx dz, s/p DES Continue beta-blockers, statin, Plavix, isosorbide and hydralazine      Significant anoxic brain injury secondary to cardiac arrest/deconditioning/gait instability -Consistently participating with PT and OT.  SLP has cleared for dysphagia 2 with thin liquids -Declined by CIR  since family unable to provide 24/7 care.  Plan is for SNF -Continue Seroquel 100 mg at at bedtime -2/18 add Seroquel 25 mg every 12 hours as needed for agitation and wandering -Continue daytime Symmetrel  **PT NOTES FROM 3/14**  Making slow progression toward goals.  Emphasis on following simple direction, tranfers to stand, standing balance/sink activity, progression of gait stability/stamina with the RW.   SLP notes 3/14:   Pt was asleep when SLP entered the room but  became more alert with repositioning and multimodal cues. He was agreeable to more PO trials today in which no overt s/s of aspiration were noted with liquids or solids; however, prolonged mastication was observed with solids. Pt continues to demonstrate limited verbal expression despite Max multimodal cues, but did nod/shake his head. He repeated the word "off" to indicate that he wanted his lights off at the end of today's session with Max encouragement and verbal cues. SLP will continue to follow.    OT notes 3/11: Pt progressing towards acute OT goals. Able to walk from EOB to chair placed in front of sink with min steadying assist and rw. Sat to work on grooming tasks at sink. Impaired cognition remains significant factor in current assist level with ADLs, see details below. Pt was able to correctly identify faces in family photos in the room, big smiles from pt with this activity. Minimal verbalizations this session. Pleasant demeanor. OT goals updated this session.  D/c plan remains appropriate.   White lesions/rash on back and buttocks -Appeared after patient had laying in tube feeding    10/10/20                           10/10/20     10/16/2020     10/20/2020                                     10/20/2020 -As of 3/4 wounds not covered by dressing are drying up and are clearing.  Have opted to remove wounds covered by dressing to allow to dry up and clear as well.  Situational depression -Started Celexa 2/16-increased to 20 mg 2/18  Ischemic cardiomyopathy -Echo this admission EF 40% -Continue hydralazine to 50 TID and increase Isordil to 20 TID -Continue Carvedilol -No ACE inhibitor secondary to underlying kidney disease -Follow-up echocardiogram done March 2022 demonstrates an EF of 40 to 45% with LVH and grade 1 diastolic dysfunction physiology  Hypertension -Was on Norvasc prior to admission -Continue cardiovascular meds as listed above  Acute on chronic kidney disease stage  IIIb -Suspect prerenal etiology in the setting of hypoperfusion from ventricular fibrillation arrest -Baseline creatinine between 1.6 and 2-creatinine as of 3/14 stable at 1.33 -Continue free water per tube and intermittently follow labs  Dysphagia, moderate protein calorie malnutrition Nutrition Problem: Increased nutrient needs Etiology: chronic illness (CKD3b) Signs/Symptoms: estimated needs  -Continue D2 diet noting patient with improved oral intake -Discontinue sliding scale insulin since no longer on tube feedings and is not diabetic -Add Ensure beverage twice daily between meals. Interventions: Ensure Enlive (each supplement provides 350kcal and 20 grams of protein),MVI,Magic cup,Prostat,Refer to RD note for recommendations Estimated body mass index is 25.38 kg/m as calculated from the following:   Height as of this encounter: 6' (1.829 m).   Weight as of this encounter: 84.9 kg. -3/10 discussed with nutritional team patient has demonstrated  improved intake of oral food and fluids.  Previously ordered bolus tube feedings have been discontinued.  We will continue Ensure as above    Other problems: Acute hypernatremia -Continue free water per tube  E. coli UTI -Has completed 5 days of Bactrim  Hyperglycemia without an underlying diagnosis of diabetes mellitus Resolved -Likely related to acute stress -Hemoglobin A1c 4.7   Acute hypoxemic respiratory failure -Resolved -Stable on room air with O2 sats between 97 and 99%  Nonsustained VT -Continue amiodarone and beta-blocker -1/31 LFTs normal  Skin tear right lateral back Wound / Incision (Open or Dehisced) 07/28/20 Skin tear Back Lateral;Right;Upper (Active)  Date First Assessed/Time First Assessed: 07/28/20 0300   Wound Type: Skin tear  Location: Back  Location Orientation: Lateral;Right;Upper  Present on Admission: No    Assessments 07/28/2020  7:49 PM 10/29/2020  8:00 AM  Dressing Type None None  Dressing Status -  Dry;Intact  Drainage Amount None -  Treatment Cleansed -     No Linked orders to display    Data Reviewed: Basic Metabolic Panel: Recent Labs  Lab 10/30/20 0317  NA 140  K 4.2  CL 105  CO2 26  GLUCOSE 103*  BUN 16  CREATININE 1.33*  CALCIUM 9.5   Liver Function Tests: Recent Labs  Lab 10/30/20 0317  AST 15  ALT 50*  ALKPHOS 71  BILITOT 0.5  PROT 7.4  ALBUMIN 3.2*   No results for input(s): LIPASE, AMYLASE in the last 168 hours. No results for input(s): AMMONIA in the last 168 hours. CBC: Recent Labs  Lab 10/30/20 0317  WBC 8.1  HGB 12.6*  HCT 37.7*  MCV 93.8  PLT 220   Cardiac Enzymes: No results for input(s): CKTOTAL, CKMB, CKMBINDEX, TROPONINI in the last 168 hours. BNP (last 3 results) No results for input(s): BNP in the last 8760 hours.  ProBNP (last 3 results) No results for input(s): PROBNP in the last 8760 hours.  CBG: Recent Labs  Lab 11/01/20 1151 11/01/20 1546 11/02/20 0004 11/02/20 0613 11/02/20 1217  GLUCAP 125* 148* 129* 115* 166*    No results found for this or any previous visit (from the past 240 hour(s)).   Studies: CT HEAD WO CONTRAST  Result Date: 10/31/2020 CLINICAL DATA:  Altered mental status. EXAM: CT HEAD WITHOUT CONTRAST TECHNIQUE: Contiguous axial images were obtained from the base of the skull through the vertex without intravenous contrast. COMPARISON:  July 22, 2020 FINDINGS: Brain: There is mild cerebral atrophy with widening of the extra-axial spaces and ventricular dilatation. There are areas of decreased attenuation within the white matter tracts of the supratentorial brain, consistent with microvascular disease changes. Vascular: No hyperdense vessel or unexpected calcification. Skull: Normal. Negative for fracture or focal lesion. Sinuses/Orbits: There is mild bilateral maxillary sinus mucosal thickening. Other: None. IMPRESSION: 1. Generalized cerebral atrophy without evidence of acute intracranial pathology.  Electronically Signed   By: Virgina Norfolk M.D.   On: 10/31/2020 21:12    Scheduled Meds: . (feeding supplement) PROSource Plus  30 mL Oral TID BM  . amantadine  100 mg Oral Daily  . aspirin  81 mg Oral Daily  . atorvastatin  80 mg Oral Daily  . carvedilol  25 mg Oral BID WC  . chlorhexidine gluconate (MEDLINE KIT)  15 mL Mouth Rinse BID  . citalopram  20 mg Oral Daily  . clopidogrel  75 mg Oral Daily  . enoxaparin (LOVENOX) injection  40 mg Subcutaneous Q24H  . feeding supplement  237  mL Oral BID BM  . Gerhardt's butt cream   Topical TID  . hydrALAZINE  50 mg Oral Q8H  . isosorbide dinitrate  20 mg Oral TID  . mouth rinse  15 mL Mouth Rinse BID  . multivitamin with minerals  1 tablet Oral Daily  . polyvinyl alcohol  1 drop Both Eyes QID  . QUEtiapine  25 mg Oral Daily  . sodium chloride flush  3 mL Intravenous Q12H   Continuous Infusions:   Active Problems:   Acute ST elevation myocardial infarction (STEMI) (HCC)   Cardiac arrest (HCC)   Elevated blood pressure reading with diagnosis of hypertension   Shock circulatory (HCC)   Encephalopathy   History of ETT   Cardiomyopathy, ischemic   Anoxic brain damage ([place])   Dysphagia   DNR (do not resuscitate) discussion   Gait instability   E. coli UTI   Essential hypertension   Consultants:  Cardiology  Interventional radiology  PCCM  Neurology  Procedures:  Cardiac catheterization 12/4  Continuous EEG 12/4 and overnight EEG with video 12/5  Echocardiogram 12/5  EEG 12/9  Percutaneous PEG tube placement 12/30  Antibiotics: Anti-infectives (From admission, onward)   Start     Dose/Rate Route Frequency Ordered Stop   10/14/20 1000  fluconazole (DIFLUCAN) tablet 100 mg        100 mg Oral Daily 10/14/20 0848 10/18/20 0932   09/13/20 1000  sulfamethoxazole-trimethoprim (BACTRIM) 200-40 MG/5ML suspension 20 mL        20 mL Per Tube Every 12 hours 09/13/20 0734 09/19/20 0926   09/13/20 0745  cefTRIAXone  (ROCEPHIN) 1 g in sodium chloride 0.9 % 100 mL IVPB  Status:  Discontinued        1 g 200 mL/hr over 30 Minutes Intravenous Every 24 hours 09/13/20 0658 09/13/20 0734   08/17/20 0000  ceFAZolin (ANCEF) IVPB 2g/100 mL premix        2 g 200 mL/hr over 30 Minutes Intravenous To Radiology 08/15/20 1346 08/17/20 1111   07/22/20 0830  cefTRIAXone (ROCEPHIN) 2 g in sodium chloride 0.9 % 100 mL IVPB        2 g 200 mL/hr over 30 Minutes Intravenous Every 24 hours 07/22/20 0720 07/26/20 0858       Time spent: 20 minutes    Nolberto Hanlon MD [facility] 7 am - 330 pm/M-F for direct patient care and secure chat Please refer to [place] for contact information 103 days

## 2020-11-03 LAB — GLUCOSE, CAPILLARY
Glucose-Capillary: 105 mg/dL — ABNORMAL HIGH (ref 70–99)
Glucose-Capillary: 118 mg/dL — ABNORMAL HIGH (ref 70–99)
Glucose-Capillary: 144 mg/dL — ABNORMAL HIGH (ref 70–99)
Glucose-Capillary: 82 mg/dL (ref 70–99)

## 2020-11-03 MED ORDER — ALPRAZOLAM 0.25 MG PO TABS
0.2500 mg | ORAL_TABLET | Freq: Once | ORAL | Status: AC
  Administered 2020-11-05: 0.25 mg via ORAL
  Filled 2020-11-03: qty 1

## 2020-11-03 NOTE — Progress Notes (Signed)
Physical Therapy Treatment Patient Details Name: Antonio Navarro MRN: [id number] DOB: 1960/11/06 Today's Date: 11/03/2020    History of Present Illness [name] admitted 07/22/20 post cardiac arrest with inferolateral STEMI. He was resuscitated in the field by EMS after being found to be in V fib. Approx 30 minutes of CPR. He was intubated on arrival.  Pt with anoxic brain injury. PEG placed on 08/17/20. PMH: tobacco abuse, obesity, HTN.    PT Comments    Patient progressing slowly towards PT goals. Pt impulsive and eager to get OOB today as he has been getting up all morning per nursing. Requires min A for bed mobility and transfers; decreased initiation noted. Tolerated gait training with Min A and use of RW with multiple LOB requiring max A to prevent fall due to pt adamantly trying to stay away from own room and jerking walker in different directions. Difficult to redirect at times as pt easily distracted. Poor awareness of safety/deficits. Pt would benefit from getting some fresh air outside if plausible when family present to help with cognition/mental state etc. Will message MD. Will follow.    Follow Up Recommendations  SNF;Supervision/Assistance - 24 hour     Equipment Recommendations  Rolling walker with 5" wheels;3in1 (PT);Wheelchair (measurements PT);Wheelchair cushion (measurements PT)    Recommendations for Other Services       Precautions / Restrictions Precautions Precautions: Fall Precaution Comments: g tube Restrictions Weight Bearing Restrictions: No    Mobility  Bed Mobility Overal bed mobility: Needs Assistance Bed Mobility: Supine to Sit     Supine to sit: Min assist;HOB elevated     General bed mobility comments: cues and assist to initiate getting OOB, min assist for trunk.    Transfers Overall transfer level: Needs assistance Equipment used: None;Rolling walker (2 wheeled) Transfers: Sit to/from Stand Sit to Stand: Min assist          General transfer comment: Min A to power up and steady in standing. Grabbing RW for UE support due to imbalance.  Ambulation/Gait Ambulation/Gait assistance: Min assist;Max assist Gait Distance (Feet): 280 Feet Assistive device: Rolling walker (2 wheeled) Gait Pattern/deviations: Step-through pattern;Narrow base of support;Scissoring Gait velocity: reduced   General Gait Details: Slow, unsteady gait with narrow BoS, scissoring at times and drifting in both directions trying to enter other patient's rooms. Difficult to redirect at times, easily distracted. Difficulty with turns. Multiple LOB requiring max A to prevent fall esp when trying to veer RW away from therapist's cues/his own room.   Stairs             Wheelchair Mobility    Modified Rankin (Stroke Patients Only)       Balance Overall balance assessment: Needs assistance Sitting-balance support: Feet supported;Single extremity supported Sitting balance-Leahy Scale: Fair     Standing balance support: During functional activity Standing balance-Leahy Scale: Poor Standing balance comment: Requires UE vs external support for standing. poor awareness of balance/deficits.                            Cognition Arousal/Alertness: Awake/alert Behavior During Therapy: WFL for tasks assessed/performed;Impulsive Overall Cognitive Status: Impaired/Different from baseline Area of Impairment: Orientation;Attention;Memory;Following commands;Safety/judgement;Awareness;Problem solving                 Orientation Level: Situation;Time;Disoriented to Current Attention Level: Sustained Memory: Decreased short-term memory Following Commands: Follows one step commands with increased time (except when choosing not too) Safety/Judgement: Decreased  awareness of safety;Decreased awareness of deficits Awareness: Intellectual Problem Solving: Slow processing;Requires verbal cues;Decreased initiation General Comments:  Impulsive with all movement; asking to get out of here. Poor awareness of safety/deficits. Difficulty redirecting at times, trying to walk into patient's rooms. Not wanting to return to his own room despite cues resulting in pulling RW away from that direction and a few LOBs.      Exercises      General Comments        Pertinent Vitals/Pain Pain Assessment: Faces Faces Pain Scale: No hurt    Home Living                      Prior Function            PT Goals (current goals can now be found in the care plan section) Progress towards PT goals: Progressing toward goals    Frequency    Min 3X/week      PT Plan Current plan remains appropriate    Co-evaluation              AM-PAC PT "6 Clicks" Mobility   Outcome Measure  Help needed turning from your back to your side while in a flat bed without using bedrails?: A Little Help needed moving from lying on your back to sitting on the side of a flat bed without using bedrails?: A Little Help needed moving to and from a bed to a chair (including a wheelchair)?: A Little Help needed standing up from a chair using your arms (e.g., wheelchair or bedside chair)?: A Little Help needed to walk in hospital room?: A Little Help needed climbing 3-5 steps with a railing? : A Lot 6 Click Score: 17    End of Session Equipment Utilized During Treatment: Gait belt Activity Tolerance: Patient tolerated treatment well Patient left: in bed;with call bell/phone within reach;with bed alarm set (sitting EOB) Nurse Communication: Mobility status PT Visit Diagnosis: Other abnormalities of gait and mobility (R26.89);Difficulty in walking, not elsewhere classified (R26.2)     Time: 2542-7062 PT Time Calculation (min) (ACUTE ONLY): 21 min  Charges:  $Gait Training: 8-22 mins                     [name], PT, DPT Acute Rehabilitation Services Pager [phone] Office [place] 11/03/2020, 12:37 PM

## 2020-11-03 NOTE — Discharge Instructions (Signed)

## 2020-11-03 NOTE — Progress Notes (Signed)
TOC Director Nathaniel Man spoke with administration at [facility] - patient is still under review at this time.  [name], MSW, LCSW Transitions of Care  Clinical Social Worker II [phone]

## 2020-11-03 NOTE — Progress Notes (Signed)
[facility] PROGRESS NOTE  Antonio Navarro IPJ:[id number] DOB: 12-18-1960 DOA: 07/22/2020 PCP: Patient, No Pcp Per       Status: Remains inpatient appropriate because:Altered mental status and Unsafe d/c plan   Dispo: The patient is from: Home              Anticipated d/c is to: SNF              Anticipated d/c date is: > 3 days              Patient currently is medically stable to d/c.  Barriers to discharge: Medicaid and disability pending. No SNF bed offers.  No further nocturnal agitation or wandering behavior so hopeful will be eligible for SNF bed offer.  Patient able to ambulate 300 feet so hopefully he can eventually discharge home with family as long as they can provide appropriate 24/7 monitoring.  On 2/28 TOC contacted at [facility]   Code Status: Full Family Communication: none at bedside DVT prophylaxis: sq heparin Vaccination status: Completed both doses of Pfizer Covid vaccination on 12/14/2019; [name] ordered to be given on 3/3 Foley catheter: No  HPI: 60 year old male history of hypertension prescribed Norvasc prior to admission, chronic back pain, prior renal calculi who experienced an out of hospital cardiac arrest with downtime of 9 minutes.  Initial rhythm was ventricular fibrillation.  He was defibrillated deceived 6 doses of epi before ROSC.  Subsequent work-up revealed STEMI.  He underwent cardiac catheterization with a DES placed to the circumflex.  Cardiology following and has placed patient on Brilinta and aspirin and is also being treated with beta-blocker and statins.  From a neurological standpoint he clinically has anoxic brain injury.  During this hospitalization he experienced AKI and was treated with fluids and correction of hypoperfusion from cardiac arrest.  Echo performed this admission revealed ischemic cardiomyopathy with an EF of 40%  In addition to above patient has been unable to pass swallowing evaluations and  subsequently has undergone percutaneous PEG tube placement on 12/30.  Patient is awake and moving all extremities spontaneously but does not follow commands and does not verbalize palliative care has met with patient and family and has emphasized poor prognosis and will continue to meet with the family during the hospitalization.  Current plan is to transition to SNF bed once available.  Expect need for lifelong 24/7 care for all ADLs.  Subjective: No issues or complaints  Objective: Vitals:   11/02/20 1930 11/03/20 0420  BP: 134/77 117/77  Pulse: 83 85  Resp: 18 18  Temp: 98.3 F (36.8 C) 98 F (36.7 C)  SpO2: 99% 98%    Intake/Output Summary (Last 24 hours) at 11/03/2020 7341 Last data filed at 11/03/2020 0100 Gross per 24 hour  Intake 740 ml  Output 800 ml  Net -60 ml   Filed Weights   11/01/20 0321 11/02/20 0500 11/03/20 0132  Weight: 84.6 kg 84.9 kg 84.9 kg    Exam: Calm, nad cta no w/r/r Regular s1/s2 no gallop Soft benign +bs No edema Grossly intact    Assessment/Plan: Acute problems: Out of hospital cardiac arrest secondary to STEMI ROSC and subsequuent cardiac cath revealed CX dx, s/p DES Continue beta blk, statin, plavix, isosorbide and hydralazine     Significant anoxic brain injury secondary to cardiac arrest/deconditioning/gait instability -Consistently participating with PT and OT.  SLP has cleared for dysphagia 2 with thin liquids -Declined by CIR since family unable to provide 24/7 care.  Plan is for SNF -Continue Seroquel 100 mg at at bedtime -2/18 add Seroquel 25 mg every 12 hours as needed for agitation and wandering -Continue daytime Symmetrel  **PT NOTES FROM 3/14**  Making slow progression toward goals.  Emphasis on following simple direction, tranfers to stand, standing balance/sink activity, progression of gait stability/stamina with the RW.   SLP notes 3/14:   Pt was asleep when SLP entered the room but became more alert with  repositioning and multimodal cues. He was agreeable to more PO trials today in which no overt s/s of aspiration were noted with liquids or solids; however, prolonged mastication was observed with solids. Pt continues to demonstrate limited verbal expression despite Max multimodal cues, but did nod/shake his head. He repeated the word "off" to indicate that he wanted his lights off at the end of today's session with Max encouragement and verbal cues. SLP will continue to follow.    OT notes 3/11: Pt progressing towards acute OT goals. Able to walk from EOB to chair placed in front of sink with min steadying assist and rw. Sat to work on grooming tasks at sink. Impaired cognition remains significant factor in current assist level with ADLs, see details below. Pt was able to correctly identify faces in family photos in the room, big smiles from pt with this activity. Minimal verbalizations this session. Pleasant demeanor. OT goals updated this session.  D/c plan remains appropriate.   White lesions/rash on back and buttocks -Appeared after patient had laying in tube feeding    10/10/20                           10/10/20     10/16/2020     10/20/2020                                     10/20/2020 -As of 3/4 wounds not covered by dressing are drying up and are clearing.  Have opted to remove wounds covered by dressing to allow to dry up and clear as well.  Situational depression -Started Celexa 2/16-increased to 20 mg 2/18  Ischemic cardiomyopathy -Echo this admission EF 40% -Continue hydralazine to 50 TID and increase Isordil to 20 TID -Continue Carvedilol -No ACE inhibitor secondary to underlying kidney disease -Follow-up echocardiogram done March 2022 demonstrates an EF of 40 to 45% with LVH and grade 1 diastolic dysfunction physiology  Hypertension -Was on Norvasc prior to admission -Continue cardiovascular meds as listed above  Acute on chronic kidney disease stage IIIb -Suspect prerenal  etiology in the setting of hypoperfusion from ventricular fibrillation arrest -Baseline creatinine between 1.6 and 2-creatinine as of 3/14 stable at 1.33 -Continue free water per tube and intermittently follow labs  Dysphagia, moderate protein calorie malnutrition Nutrition Problem: Increased nutrient needs Etiology: chronic illness (CKD3b) Signs/Symptoms: estimated needs  -Continue D2 diet noting patient with improved oral intake -Discontinue sliding scale insulin since no longer on tube feedings and is not diabetic -Add Ensure beverage twice daily between meals. Interventions: Ensure Enlive (each supplement provides 350kcal and 20 grams of protein),MVI,Magic cup,Prostat,Refer to RD note for recommendations Estimated body mass index is 25.38 kg/m as calculated from the following:   Height as of this encounter: 6' (1.829 m).   Weight as of this encounter: 84.9 kg. -3/10 discussed with nutritional team patient has demonstrated improved intake of oral food and fluids.  Previously ordered bolus tube feedings have been discontinued.  We will continue Ensure as above    Other problems: Acute hypernatremia -Continue free water per tube  E. coli UTI -Has completed 5 days of Bactrim  Hyperglycemia without an underlying diagnosis of diabetes mellitus Resolved -Likely related to acute stress -Hemoglobin A1c 4.7   Acute hypoxemic respiratory failure -Resolved -Stable on room air with O2 sats between 97 and 99%  Nonsustained VT -Continue amiodarone and beta-blocker -1/31 LFTs normal  Skin tear right lateral back Wound / Incision (Open or Dehisced) 07/28/20 Skin tear Back Lateral;Right;Upper (Active)  Date First Assessed/Time First Assessed: 07/28/20 0300   Wound Type: Skin tear  Location: Back  Location Orientation: Lateral;Right;Upper  Present on Admission: No    Assessments 07/28/2020  7:49 PM 10/29/2020  8:00 AM  Dressing Type None None  Dressing Status - Dry;Intact  Drainage  Amount None -  Treatment Cleansed -     No Linked orders to display    Data Reviewed: Basic Metabolic Panel: Recent Labs  Lab 10/30/20 0317  NA 140  K 4.2  CL 105  CO2 26  GLUCOSE 103*  BUN 16  CREATININE 1.33*  CALCIUM 9.5   Liver Function Tests: Recent Labs  Lab 10/30/20 0317  AST 15  ALT 50*  ALKPHOS 71  BILITOT 0.5  PROT 7.4  ALBUMIN 3.2*   No results for input(s): LIPASE, AMYLASE in the last 168 hours. No results for input(s): AMMONIA in the last 168 hours. CBC: Recent Labs  Lab 10/30/20 0317  WBC 8.1  HGB 12.6*  HCT 37.7*  MCV 93.8  PLT 220   Cardiac Enzymes: No results for input(s): CKTOTAL, CKMB, CKMBINDEX, TROPONINI in the last 168 hours. BNP (last 3 results) No results for input(s): BNP in the last 8760 hours.  ProBNP (last 3 results) No results for input(s): PROBNP in the last 8760 hours.  CBG: Recent Labs  Lab 11/02/20 0613 11/02/20 1217 11/03/20 0001 11/03/20 0545 11/03/20 0742  GLUCAP 115* 166* 82 105* 118*    No results found for this or any previous visit (from the past 240 hour(s)).   Studies: No results found.  Scheduled Meds: . (feeding supplement) PROSource Plus  30 mL Oral TID BM  . amantadine  100 mg Oral Daily  . aspirin  81 mg Oral Daily  . atorvastatin  80 mg Oral Daily  . carvedilol  25 mg Oral BID WC  . chlorhexidine gluconate (MEDLINE KIT)  15 mL Mouth Rinse BID  . citalopram  20 mg Oral Daily  . clopidogrel  75 mg Oral Daily  . enoxaparin (LOVENOX) injection  40 mg Subcutaneous Q24H  . feeding supplement  237 mL Oral BID BM  . Gerhardt's butt cream   Topical TID  . hydrALAZINE  50 mg Oral Q8H  . isosorbide dinitrate  20 mg Oral TID  . mouth rinse  15 mL Mouth Rinse BID  . multivitamin with minerals  1 tablet Oral Daily  . polyvinyl alcohol  1 drop Both Eyes QID  . QUEtiapine  25 mg Oral Daily  . sodium chloride flush  3 mL Intravenous Q12H   Continuous Infusions:   Active Problems:   Acute ST  elevation myocardial infarction (STEMI) (HCC)   Cardiac arrest (HCC)   Elevated blood pressure reading with diagnosis of hypertension   Shock circulatory (HCC)   Encephalopathy   History of ETT   Cardiomyopathy, ischemic   Anoxic brain damage (HCC)   Dysphagia  DNR (do not resuscitate) discussion   Gait instability   E. coli UTI   Essential hypertension   Consultants:  Cardiology  Interventional radiology  PCCM  Neurology  Procedures:  Cardiac catheterization 12/4  Continuous EEG 12/4 and overnight EEG with video 12/5  Echocardiogram 12/5  EEG 12/9  Percutaneous PEG tube placement 12/30  Antibiotics: Anti-infectives (From admission, onward)   Start     Dose/Rate Route Frequency Ordered Stop   10/14/20 1000  fluconazole (DIFLUCAN) tablet 100 mg        100 mg Oral Daily 10/14/20 0848 10/18/20 0932   09/13/20 1000  sulfamethoxazole-trimethoprim (BACTRIM) 200-40 MG/5ML suspension 20 mL        20 mL Per Tube Every 12 hours 09/13/20 0734 09/19/20 0926   09/13/20 0745  cefTRIAXone (ROCEPHIN) 1 g in sodium chloride 0.9 % 100 mL IVPB  Status:  Discontinued        1 g 200 mL/hr over 30 Minutes Intravenous Every 24 hours 09/13/20 0658 09/13/20 0734   08/17/20 0000  ceFAZolin (ANCEF) IVPB 2g/100 mL premix        2 g 200 mL/hr over 30 Minutes Intravenous To Radiology 08/15/20 1346 08/17/20 1111   07/22/20 0830  cefTRIAXone (ROCEPHIN) 2 g in sodium chloride 0.9 % 100 mL IVPB        2 g 200 mL/hr over 30 Minutes Intravenous Every 24 hours 07/22/20 0720 07/26/20 0858       Time spent: 20 minutes    Nolberto Hanlon MD [facility] 7 am - 330 pm/M-F for direct patient care and secure chat Please refer to Amion for contact information 104 days

## 2020-11-03 NOTE — Progress Notes (Signed)
Occupational Therapy Treatment Patient Details Name: Antonio Navarro MRN: [id number] DOB: 10-21-1960 Today's Date: 11/03/2020    History of present illness [name] admitted 07/22/20 post cardiac arrest with inferolateral STEMI. He was resuscitated in the field by EMS after being found to be in V fib. Approx 30 minutes of CPR. He was intubated on arrival.  Pt with anoxic brain injury. PEG placed on 08/17/20. PMH: tobacco abuse, obesity, HTN.   OT comments  Pt making good progress towards OT goals, especially in comparison to initial eval. Worked on command following this session and higher level cognitive tasks as pt verbalizing more, stating his name, DOB, fiances name and what he was watching on TV- see cog section. Pt agreeable to functional mobility in hallway this session although pt noted to present with a narrow BOS during ambulation, one LOB noted during transition in hallway needing MIN A to correct. Worked on stating room numbers during ambulation with pt needing increased time and MOD cues to locate and state numbers. MAX cues needed to locate his own room. Pt would continue to benefit from skilled occupational therapy while admitted and after d/c to address the below listed limitations in order to improve overall functional mobility and facilitate independence with BADL participation. DC plan remains appropriate, will follow acutely per POC.     Follow Up Recommendations  SNF;Supervision/Assistance - 24 hour    Equipment Recommendations  Wheelchair (measurements OT);Wheelchair cushion (measurements OT);Hospital bed;3 in 1 bedside commode    Recommendations for Other Services      Precautions / Restrictions Precautions Precautions: Fall Precaution Comments: g tube Restrictions Weight Bearing Restrictions: No       Mobility Bed Mobility Overal bed mobility: Needs Assistance Bed Mobility: Supine to Sit;Sit to Supine     Supine to sit: Min assist;HOB elevated Sit to  supine: Min assist;HOB elevated   General bed mobility comments: light MIN A, more so for directional and tactile cues to initiate and sequence task    Transfers Overall transfer level: Needs assistance Equipment used: Rolling walker (2 wheeled) Transfers: Sit to/from Stand Sit to Stand: Supervision         General transfer comment: supervision from EOB    Balance Overall balance assessment: Needs assistance Sitting-balance support: Feet supported;No upper extremity supported Sitting balance-Leahy Scale: Fair     Standing balance support: During functional activity;Bilateral upper extremity supported Standing balance-Leahy Scale: Poor Standing balance comment: BUE support for mobility this session                           ADL either performed or assessed with clinical judgement   ADL Overall ADL's : Needs assistance/impaired                 Upper Body Dressing : Minimal assistance;Sitting Upper Body Dressing Details (indicate cue type and reason): to don gown as back side cover Lower Body Dressing: Supervision/safety;Set up Lower Body Dressing Details (indicate cue type and reason): to don slide on shoes from EOB Toilet Transfer: Minimal assistance;RW;Ambulation Toilet Transfer Details (indicate cue type and reason): simulated via functional mobility with RW with pt needing increased cues to widen BOS as pt presents with very narrow BOS. MIN A for overall balance, one LOB noted during transition turn         Functional mobility during ADLs: Minimal assistance;Cueing for safety;Rolling walker General ADL Comments: session focus on functional mobiltiy with emphasis on cognition  Vision Baseline Vision/History: Wears glasses Wears Glasses: Reading only     Perception     Praxis      Cognition Arousal/Alertness: Awake/alert Behavior During Therapy: WFL for tasks assessed/performed;Impulsive Overall Cognitive Status: Impaired/Different from  baseline Area of Impairment: Attention;Following commands;Safety/judgement;Awareness;Problem solving                 Orientation Level: Situation;Time;Disoriented to Current Attention Level: Sustained Memory: Decreased short-term memory Following Commands: Follows one step commands with increased time Safety/Judgement: Decreased awareness of safety;Decreased awareness of deficits Awareness: Intellectual Problem Solving: Slow processing;Requires verbal cues;Decreased initiation General Comments: pt able to locate items on menu with increased time ( helps when menu is folded in half so environement is not so stimulating), pt communicating more stating his own name, bithday and fiance name. pt able to state what show he was watching on TV and was able to copy familar words on paper such as "[place]""kim" and "panthers" attempted tic- tac-toe game with pt unable to sequence steps. pt able to circle pictures of items on paper such as "coffee" and "soup"        Exercises     Shoulder Instructions       General Comments encouraged fiance to work on cog tasks with pt such as ordering from menu and  problem solving tasks    Pertinent Vitals/ Pain       Pain Assessment: No/denies pain Faces Pain Scale: No hurt  Home Living                                          Prior Functioning/Environment              Frequency  Min 2X/week        Progress Toward Goals  OT Goals(current goals can now be found in the care plan section)  Progress towards OT goals: Progressing toward goals  Acute Rehab OT Goals Patient Stated Goal: to go home after rehab OT Goal Formulation: With patient/family Time For Goal Achievement: 11/10/20 (seen by OTR 3/11) Potential to Achieve Goals: [place] Discharge plan remains appropriate;Frequency remains appropriate    Co-evaluation                 AM-PAC OT "6 Clicks" Daily Activity     Outcome Measure   Help from  another person eating meals?: A Little Help from another person taking care of personal grooming?: A Little Help from another person toileting, which includes using toliet, bedpan, or urinal?: A Little Help from another person bathing (including washing, rinsing, drying)?: A Little Help from another person to put on and taking off regular upper body clothing?: None Help from another person to put on and taking off regular lower body clothing?: None 6 Click Score: 20    End of Session Equipment Utilized During Treatment: Gait belt;Rolling walker  OT Visit Diagnosis: Muscle weakness (generalized) (M62.81);Pain;Other abnormalities of gait and mobility (R26.89);Cognitive communication deficit (R41.841);Low vision, both eyes (H54.2) Symptoms and signs involving cognitive functions: Other Nontraumatic ICH   Activity Tolerance Patient tolerated treatment well   Patient Left in bed;with call bell/phone within reach;with bed alarm set;with family/visitor present   Nurse Communication Mobility status;Other (comment) (needs new condom cath)        Time: 1355-1440 OT Time Calculation (min): 45 min  Charges: OT General Charges $OT Visit: 1 Visit OT Treatments $Therapeutic  Activity: 38-52 mins  [name]., COTA/L Acute Rehabilitation Services [phone]   Precious Haws 11/03/2020, 4:17 PM

## 2020-11-04 LAB — GLUCOSE, CAPILLARY
Glucose-Capillary: 128 mg/dL — ABNORMAL HIGH (ref 70–99)
Glucose-Capillary: 131 mg/dL — ABNORMAL HIGH (ref 70–99)
Glucose-Capillary: 133 mg/dL — ABNORMAL HIGH (ref 70–99)
Glucose-Capillary: 178 mg/dL — ABNORMAL HIGH (ref 70–99)

## 2020-11-04 NOTE — Progress Notes (Signed)
[facility] PROGRESS NOTE  Antonio Navarro DVV:[id number] DOB: [birth year] DOA: 07/22/2020 PCP: Patient, No Pcp Per       Status: Remains inpatient appropriate because:Altered mental status and Unsafe d/c plan   Dispo: The patient is from: Home              Anticipated d/c is to: SNF              Anticipated d/c date is: > 3 days              Patient currently is medically stable to d/c.  Barriers to discharge: Medicaid and disability pending. No SNF bed offers.  No further nocturnal agitation or wandering behavior so hopeful will be eligible for SNF bed offer.  Patient able to ambulate 300 feet so hopefully he can eventually discharge home with family as long as they can provide appropriate 24/7 monitoring.  On 2/28 TOC contacted at [facility]   Code Status: Full Family Communication: none at bedside DVT prophylaxis: sq heparin Vaccination status: Completed both doses of Pfizer Covid vaccination on 12/14/2019; [place] booster ordered to be given on 3/3 Foley catheter: No  HPI: 60 year old male history of hypertension prescribed Norvasc prior to admission, chronic back pain, prior renal calculi who experienced an out of hospital cardiac arrest with downtime of 9 minutes.  Initial rhythm was ventricular fibrillation.  He was defibrillated deceived 6 doses of epi before ROSC.  Subsequent work-up revealed STEMI.  He underwent cardiac catheterization with a DES placed to the circumflex.  Cardiology following and has placed patient on Brilinta and aspirin and is also being treated with beta-blocker and statins.  From a neurological standpoint he clinically has anoxic brain injury.  During this hospitalization he experienced AKI and was treated with fluids and correction of hypoperfusion from cardiac arrest.  Echo performed this admission revealed ischemic cardiomyopathy with an EF of 40%  In addition to above patient has been unable to pass swallowing evaluations and  subsequently has undergone percutaneous PEG tube placement on 12/30.  Patient is awake and moving all extremities spontaneously but does not follow commands and does not verbalize palliative care has met with patient and family and has emphasized poor prognosis and will continue to meet with the family during the hospitalization.  Current plan is to transition to SNF bed once available.  Expect need for lifelong 24/7 care for all ADLs.  Subjective: No issues or complaints  Objective: Vitals:   11/04/20 0541 11/04/20 1340  BP: 107/84 (!) 89/62  Pulse: 74 75  Resp: 18 20  Temp: (!) 97.4 F (36.3 C)   SpO2: 99% 98%    Intake/Output Summary (Last 24 hours) at 11/04/2020 1623 Last data filed at 11/04/2020 0900 Gross per 24 hour  Intake 794 ml  Output 150 ml  Net 644 ml   Filed Weights   11/03/20 0132 11/04/20 0037 11/04/20 0251  Weight: 84.9 kg 84.3 kg 84.3 kg    Exam: Calm, sleepy this am cta no w/r/r rrr s1/s2 no gallop Soft benign +bs No edema Mood and affect appropriate in current setting   Assessment/Plan: Acute problems: Out of hospital cardiac arrest secondary to STEMI ROSC and subsequent cadiac cath revealed CX dx, s/p DES cotninue beta blk, statin, asa, isosorbide, and hydralazine      Significant anoxic brain injury secondary to cardiac arrest/deconditioning/gait instability -Consistently participating with PT and OT.  SLP has cleared for dysphagia 2 with thin liquids -Declined by CIR since  family unable to provide 24/7 care.  Plan is for SNF -Continue Seroquel 100 mg at at bedtime -2/18 add Seroquel 25 mg every 12 hours as needed for agitation and wandering -Continue daytime Symmetrel  **PT NOTES FROM 3/14**  Making slow progression toward goals.  Emphasis on following simple direction, tranfers to stand, standing balance/sink activity, progression of gait stability/stamina with the RW.   SLP notes 3/14:   Pt was asleep when SLP entered the room but  became more alert with repositioning and multimodal cues. He was agreeable to more PO trials today in which no overt s/s of aspiration were noted with liquids or solids; however, prolonged mastication was observed with solids. Pt continues to demonstrate limited verbal expression despite Max multimodal cues, but did nod/shake his head. He repeated the word "off" to indicate that he wanted his lights off at the end of today's session with Max encouragement and verbal cues. SLP will continue to follow.    OT notes 3/11: Pt progressing towards acute OT goals. Able to walk from EOB to chair placed in front of sink with min steadying assist and rw. Sat to work on grooming tasks at sink. Impaired cognition remains significant factor in current assist level with ADLs, see details below. Pt was able to correctly identify faces in family photos in the room, big smiles from pt with this activity. Minimal verbalizations this session. Pleasant demeanor. OT goals updated this session.  D/c plan remains appropriate.   White lesions/rash on back and buttocks -Appeared after patient had laying in tube feeding    10/10/20                           10/10/20     10/16/2020     10/20/2020                                     10/20/2020 -As of 3/4 wounds not covered by dressing are drying up and are clearing.  Have opted to remove wounds covered by dressing to allow to dry up and clear as well.  Situational depression -Started Celexa 2/16-increased to 20 mg 2/18  Ischemic cardiomyopathy -Echo this admission EF 40% -Continue hydralazine to 50 TID and increase Isordil to 20 TID -Continue Carvedilol -No ACE inhibitor secondary to underlying kidney disease -Follow-up echocardiogram done March 2022 demonstrates an EF of 40 to 45% with LVH and grade 1 diastolic dysfunction physiology  Hypertension -Was on Norvasc prior to admission -Continue cardiovascular meds as listed above  Acute on chronic kidney disease stage  IIIb -Suspect prerenal etiology in the setting of hypoperfusion from ventricular fibrillation arrest -Baseline creatinine between 1.6 and 2-creatinine as of 3/14 stable at 1.33 -Continue free water per tube and intermittently follow labs  Dysphagia, moderate protein calorie malnutrition Nutrition Problem: Increased nutrient needs Etiology: chronic illness (CKD3b) Signs/Symptoms: estimated needs  -Continue D2 diet noting patient with improved oral intake -Discontinue sliding scale insulin since no longer on tube feedings and is not diabetic -Add Ensure beverage twice daily between meals. Interventions: Ensure Enlive (each supplement provides 350kcal and 20 grams of protein),MVI,Magic cup,Prostat,Refer to RD note for recommendations Estimated body mass index is 25.21 kg/m as calculated from the following:   Height as of this encounter: 6' (1.829 m).   Weight as of this encounter: 84.3 kg. -3/10 discussed with nutritional team patient has demonstrated improved  intake of oral food and fluids.  Previously ordered bolus tube feedings have been discontinued.  We will continue Ensure as above    Other problems: Acute hypernatremia -Continue free water per tube  E. coli UTI -Has completed 5 days of Bactrim  Hyperglycemia without an underlying diagnosis of diabetes mellitus Resolved -Likely related to acute stress -Hemoglobin A1c 4.7   Acute hypoxemic respiratory failure -Resolved -Stable on room air with O2 sats between 97 and 99%  Nonsustained VT -Continue amiodarone and beta-blocker -1/31 LFTs normal  Skin tear right lateral back Wound / Incision (Open or Dehisced) 07/28/20 Skin tear Back Lateral;Right;Upper (Active)  Date First Assessed/Time First Assessed: 07/28/20 0300   Wound Type: Skin tear  Location: Back  Location Orientation: Lateral;Right;Upper  Present on Admission: No    Assessments 07/28/2020  7:49 PM 10/29/2020  8:00 AM  Dressing Type None None  Dressing Status -  Dry;Intact  Drainage Amount None -  Treatment Cleansed -     No Linked orders to display    Data Reviewed: Basic Metabolic Panel: Recent Labs  Lab 10/30/20 0317  NA 140  K 4.2  CL 105  CO2 26  GLUCOSE 103*  BUN 16  CREATININE 1.33*  CALCIUM 9.5   Liver Function Tests: Recent Labs  Lab 10/30/20 0317  AST 15  ALT 50*  ALKPHOS 71  BILITOT 0.5  PROT 7.4  ALBUMIN 3.2*   No results for input(s): LIPASE, AMYLASE in the last 168 hours. No results for input(s): AMMONIA in the last 168 hours. CBC: Recent Labs  Lab 10/30/20 0317  WBC 8.1  HGB 12.6*  HCT 37.7*  MCV 93.8  PLT 220   Cardiac Enzymes: No results for input(s): CKTOTAL, CKMB, CKMBINDEX, TROPONINI in the last 168 hours. BNP (last 3 results) No results for input(s): BNP in the last 8760 hours.  ProBNP (last 3 results) No results for input(s): PROBNP in the last 8760 hours.  CBG: Recent Labs  Lab 11/03/20 0742 11/03/20 1152 11/04/20 0000 11/04/20 0610 11/04/20 1134  GLUCAP 118* 144* 133* 128* 131*    No results found for this or any previous visit (from the past 240 hour(s)).   Studies: No results found.  Scheduled Meds: . (feeding supplement) PROSource Plus  30 mL Oral TID BM  . ALPRAZolam  0.25 mg Oral Once  . amantadine  100 mg Oral Daily  . aspirin  81 mg Oral Daily  . atorvastatin  80 mg Oral Daily  . carvedilol  25 mg Oral BID WC  . chlorhexidine gluconate (MEDLINE KIT)  15 mL Mouth Rinse BID  . citalopram  20 mg Oral Daily  . clopidogrel  75 mg Oral Daily  . enoxaparin (LOVENOX) injection  40 mg Subcutaneous Q24H  . feeding supplement  237 mL Oral BID BM  . Gerhardt's butt cream   Topical TID  . hydrALAZINE  50 mg Oral Q8H  . isosorbide dinitrate  20 mg Oral TID  . mouth rinse  15 mL Mouth Rinse BID  . multivitamin with minerals  1 tablet Oral Daily  . polyvinyl alcohol  1 drop Both Eyes QID  . QUEtiapine  25 mg Oral Daily  . sodium chloride flush  3 mL Intravenous Q12H    Continuous Infusions:   Active Problems:   Acute ST elevation myocardial infarction (STEMI) (HCC)   Cardiac arrest (HCC)   Elevated blood pressure reading with diagnosis of hypertension   Shock circulatory (HCC)   Encephalopathy   History  of ETT   Cardiomyopathy, ischemic   Anoxic brain damage ([place])   Dysphagia   DNR (do not resuscitate) discussion   Gait instability   E. coli UTI   Essential hypertension   Consultants:  Cardiology  Interventional radiology  PCCM  Neurology  Procedures:  Cardiac catheterization 12/4  Continuous EEG 12/4 and overnight EEG with video 12/5  Echocardiogram 12/5  EEG 12/9  Percutaneous PEG tube placement 12/30  Antibiotics: Anti-infectives (From admission, onward)   Start     Dose/Rate Route Frequency Ordered Stop   10/14/20 1000  fluconazole (DIFLUCAN) tablet 100 mg        100 mg Oral Daily 10/14/20 0848 10/18/20 0932   09/13/20 1000  sulfamethoxazole-trimethoprim (BACTRIM) 200-40 MG/5ML suspension 20 mL        20 mL Per Tube Every 12 hours 09/13/20 0734 09/19/20 0926   09/13/20 0745  cefTRIAXone (ROCEPHIN) 1 g in sodium chloride 0.9 % 100 mL IVPB  Status:  Discontinued        1 g 200 mL/hr over 30 Minutes Intravenous Every 24 hours 09/13/20 0658 09/13/20 0734   08/17/20 0000  ceFAZolin (ANCEF) IVPB 2g/100 mL premix        2 g 200 mL/hr over 30 Minutes Intravenous To Radiology 08/15/20 1346 08/17/20 1111   07/22/20 0830  cefTRIAXone (ROCEPHIN) 2 g in sodium chloride 0.9 % 100 mL IVPB        2 g 200 mL/hr over 30 Minutes Intravenous Every 24 hours 07/22/20 0720 07/26/20 0858       Time spent: 20 minutes    Nolberto Hanlon MD [facility] 7 am - 330 pm/M-F for direct patient care and secure chat Please refer to Amion for contact information 105 days

## 2020-11-05 LAB — GLUCOSE, CAPILLARY
Glucose-Capillary: 107 mg/dL — ABNORMAL HIGH (ref 70–99)
Glucose-Capillary: 142 mg/dL — ABNORMAL HIGH (ref 70–99)
Glucose-Capillary: 184 mg/dL — ABNORMAL HIGH (ref 70–99)
Glucose-Capillary: 94 mg/dL (ref 70–99)

## 2020-11-05 NOTE — Progress Notes (Signed)
[facility] PROGRESS NOTE  Antonio Navarro CHE:[id number] DOB: 10-01-1960 DOA: 07/22/2020 PCP: Patient, No Pcp Per       Status: Remains inpatient appropriate because:Altered mental status and Unsafe d/c plan   Dispo: The patient is from: Home              Anticipated d/c is to: SNF              Anticipated d/c date is: > 3 days              Patient currently is medically stable to d/c.  Barriers to discharge: Medicaid and disability pending. No SNF bed offers.  No further nocturnal agitation or wandering behavior so hopeful will be eligible for SNF bed offer.  Patient able to ambulate 300 feet so hopefully he can eventually discharge home with family as long as they can provide appropriate 24/7 monitoring.  On 2/28 TOC contacted at [facility]   Code Status: Full Family Communication: none at bedside DVT prophylaxis: sq heparin Vaccination status: Completed both doses of Pfizer Covid vaccination on 12/14/2019; [place] booster ordered to be given on 3/3 Foley catheter: No  HPI: 60 year old male history of hypertension prescribed Norvasc prior to admission, chronic back pain, prior renal calculi who experienced an out of hospital cardiac arrest with downtime of 9 minutes.  Initial rhythm was ventricular fibrillation.  He was defibrillated deceived 6 doses of epi before ROSC.  Subsequent work-up revealed STEMI.  He underwent cardiac catheterization with a DES placed to the circumflex.  Cardiology following and has placed patient on Brilinta and aspirin and is also being treated with beta-blocker and statins.  From a neurological standpoint he clinically has anoxic brain injury.  During this hospitalization he experienced AKI and was treated with fluids and correction of hypoperfusion from cardiac arrest.  Echo performed this admission revealed ischemic cardiomyopathy with an EF of 40%  In addition to above patient has been unable to pass swallowing evaluations and  subsequently has undergone percutaneous PEG tube placement on 12/30.  Patient is awake and moving all extremities spontaneously but does not follow commands and does not verbalize palliative care has met with patient and family and has emphasized poor prognosis and will continue to meet with the family during the hospitalization.  Current plan is to transition to SNF bed once available.  Expect need for lifelong 24/7 care for all ADLs.  Subjective: Appears to be in good mood this am, watching TV  Objective: Vitals:   11/05/20 0800 11/05/20 1157  BP: (!) 166/100 116/74  Pulse:  73  Resp:  20  Temp:    SpO2:  100%    Intake/Output Summary (Last 24 hours) at 11/05/2020 1421 Last data filed at 11/05/2020 1000 Gross per 24 hour  Intake 320 ml  Output 500 ml  Net -180 ml   Filed Weights   11/04/20 0037 11/04/20 0251 11/05/20 0500  Weight: 84.3 kg 84.3 kg 84.8 kg    Exam: Calm, in good mood cta no w/r/r rrr s1/s2 Soft benign +bs No edema Awake and alert   Assessment/Plan: Acute problems: Out of hospital cardiac arrest secondary to STEMI ROSC  and subsequent cardiac cath revealed circumflex disease, status post DES  Continue beta-blockers, statin, aspirin, isosorbide, hydralazine     Significant anoxic brain injury secondary to cardiac arrest/deconditioning/gait instability -Consistently participating with PT and OT.  SLP has cleared for dysphagia 2 with thin liquids -Declined by CIR since family unable to provide  24/7 care.  Plan is for SNF -Continue Seroquel 100 mg at at bedtime -2/18 add Seroquel 25 mg every 12 hours as needed for agitation and wandering -Continue daytime Symmetrel  **PT NOTES FROM 3/14**  Making slow progression toward goals.  Emphasis on following simple direction, tranfers to stand, standing balance/sink activity, progression of gait stability/stamina with the RW.   SLP notes 3/14:   Pt was asleep when SLP entered the room but became more alert  with repositioning and multimodal cues. He was agreeable to more PO trials today in which no overt s/s of aspiration were noted with liquids or solids; however, prolonged mastication was observed with solids. Pt continues to demonstrate limited verbal expression despite Max multimodal cues, but did nod/shake his head. He repeated the word "off" to indicate that he wanted his lights off at the end of today's session with Max encouragement and verbal cues. SLP will continue to follow.    OT notes 3/11: Pt progressing towards acute OT goals. Able to walk from EOB to chair placed in front of sink with min steadying assist and rw. Sat to work on grooming tasks at sink. Impaired cognition remains significant factor in current assist level with ADLs, see details below. Pt was able to correctly identify faces in family photos in the room, big smiles from pt with this activity. Minimal verbalizations this session. Pleasant demeanor. OT goals updated this session.  D/c plan remains appropriate.   White lesions/rash on back and buttocks -Appeared after patient had laying in tube feeding    10/10/20                           10/10/20     10/16/2020     10/20/2020                                     10/20/2020 -As of 3/4 wounds not covered by dressing are drying up and are clearing.  Have opted to remove wounds covered by dressing to allow to dry up and clear as well.  Situational depression -Started Celexa 2/16-increased to 20 mg 2/18  Ischemic cardiomyopathy -Echo this admission EF 40% -Continue hydralazine to 50 TID and increase Isordil to 20 TID -Continue Carvedilol -No ACE inhibitor secondary to underlying kidney disease -Follow-up echocardiogram done March 2022 demonstrates an EF of 40 to 45% with LVH and grade 1 diastolic dysfunction physiology  Hypertension -Was on Norvasc prior to admission -Continue cardiovascular meds as listed above  Acute on chronic kidney disease stage IIIb -Suspect  prerenal etiology in the setting of hypoperfusion from ventricular fibrillation arrest -Baseline creatinine between 1.6 and 2-creatinine as of 3/14 stable at 1.33 -Continue free water per tube and intermittently follow labs  Dysphagia, moderate protein calorie malnutrition Nutrition Problem: Increased nutrient needs Etiology: chronic illness (CKD3b) Signs/Symptoms: estimated needs  -Continue D2 diet noting patient with improved oral intake -Discontinue sliding scale insulin since no longer on tube feedings and is not diabetic -Add Ensure beverage twice daily between meals. Interventions: Ensure Enlive (each supplement provides 350kcal and 20 grams of protein),MVI,Magic cup,Prostat,Refer to RD note for recommendations Estimated body mass index is 25.35 kg/m as calculated from the following:   Height as of this encounter: 6' (1.829 m).   Weight as of this encounter: 84.8 kg. -3/10 discussed with nutritional team patient has demonstrated improved intake of oral food  and fluids.  Previously ordered bolus tube feedings have been discontinued.  We will continue Ensure as above    Other problems: Acute hypernatremia -Continue free water per tube  E. coli UTI -Has completed 5 days of Bactrim  Hyperglycemia without an underlying diagnosis of diabetes mellitus Resolved -Likely related to acute stress -Hemoglobin A1c 4.7   Acute hypoxemic respiratory failure -Resolved -Stable on room air with O2 sats between 97 and 99%  Nonsustained VT -Continue amiodarone and beta-blocker -1/31 LFTs normal  Skin tear right lateral back Wound / Incision (Open or Dehisced) 07/28/20 Skin tear Back Lateral;Right;Upper (Active)  Date First Assessed/Time First Assessed: 07/28/20 0300   Wound Type: Skin tear  Location: Back  Location Orientation: Lateral;Right;Upper  Present on Admission: No    Assessments 07/28/2020  7:49 PM 10/29/2020  8:00 AM  Dressing Type None None  Dressing Status - Dry;Intact   Drainage Amount None -  Treatment Cleansed -     No Linked orders to display    Data Reviewed: Basic Metabolic Panel: Recent Labs  Lab 10/30/20 0317  NA 140  K 4.2  CL 105  CO2 26  GLUCOSE 103*  BUN 16  CREATININE 1.33*  CALCIUM 9.5   Liver Function Tests: Recent Labs  Lab 10/30/20 0317  AST 15  ALT 50*  ALKPHOS 71  BILITOT 0.5  PROT 7.4  ALBUMIN 3.2*   No results for input(s): LIPASE, AMYLASE in the last 168 hours. No results for input(s): AMMONIA in the last 168 hours. CBC: Recent Labs  Lab 10/30/20 0317  WBC 8.1  HGB 12.6*  HCT 37.7*  MCV 93.8  PLT 220   Cardiac Enzymes: No results for input(s): CKTOTAL, CKMB, CKMBINDEX, TROPONINI in the last 168 hours. BNP (last 3 results) No results for input(s): BNP in the last 8760 hours.  ProBNP (last 3 results) No results for input(s): PROBNP in the last 8760 hours.  CBG: Recent Labs  Lab 11/04/20 1134 11/04/20 1942 11/05/20 0022 11/05/20 0554 11/05/20 1200  GLUCAP 131* 178* 107* 94 142*    No results found for this or any previous visit (from the past 240 hour(s)).   Studies: No results found.  Scheduled Meds: . (feeding supplement) PROSource Plus  30 mL Oral TID BM  . ALPRAZolam  0.25 mg Oral Once  . amantadine  100 mg Oral Daily  . aspirin  81 mg Oral Daily  . atorvastatin  80 mg Oral Daily  . carvedilol  25 mg Oral BID WC  . chlorhexidine gluconate (MEDLINE KIT)  15 mL Mouth Rinse BID  . citalopram  20 mg Oral Daily  . clopidogrel  75 mg Oral Daily  . enoxaparin (LOVENOX) injection  40 mg Subcutaneous Q24H  . feeding supplement  237 mL Oral BID BM  . Gerhardt's butt cream   Topical TID  . hydrALAZINE  50 mg Oral Q8H  . isosorbide dinitrate  20 mg Oral TID  . mouth rinse  15 mL Mouth Rinse BID  . multivitamin with minerals  1 tablet Oral Daily  . polyvinyl alcohol  1 drop Both Eyes QID  . QUEtiapine  25 mg Oral Daily  . sodium chloride flush  3 mL Intravenous Q12H   Continuous  Infusions:   Active Problems:   Acute ST elevation myocardial infarction (STEMI) (HCC)   Cardiac arrest (HCC)   Elevated blood pressure reading with diagnosis of hypertension   Shock circulatory (HCC)   Encephalopathy   History of ETT  Cardiomyopathy, ischemic   Anoxic brain damage ([place])   Dysphagia   DNR (do not resuscitate) discussion   Gait instability   E. coli UTI   Essential hypertension   Consultants:  Cardiology  Interventional radiology  PCCM  Neurology  Procedures:  Cardiac catheterization 12/4  Continuous EEG 12/4 and overnight EEG with video 12/5  Echocardiogram 12/5  EEG 12/9  Percutaneous PEG tube placement 12/30  Antibiotics: Anti-infectives (From admission, onward)   Start     Dose/Rate Route Frequency Ordered Stop   10/14/20 1000  fluconazole (DIFLUCAN) tablet 100 mg        100 mg Oral Daily 10/14/20 0848 10/18/20 0932   09/13/20 1000  sulfamethoxazole-trimethoprim (BACTRIM) 200-40 MG/5ML suspension 20 mL        20 mL Per Tube Every 12 hours 09/13/20 0734 09/19/20 0926   09/13/20 0745  cefTRIAXone (ROCEPHIN) 1 g in sodium chloride 0.9 % 100 mL IVPB  Status:  Discontinued        1 g 200 mL/hr over 30 Minutes Intravenous Every 24 hours 09/13/20 0658 09/13/20 0734   08/17/20 0000  ceFAZolin (ANCEF) IVPB 2g/100 mL premix        2 g 200 mL/hr over 30 Minutes Intravenous To Radiology 08/15/20 1346 08/17/20 1111   07/22/20 0830  cefTRIAXone (ROCEPHIN) 2 g in sodium chloride 0.9 % 100 mL IVPB        2 g 200 mL/hr over 30 Minutes Intravenous Every 24 hours 07/22/20 0720 07/26/20 0858       Time spent: 20 minutes    Nolberto Hanlon MD [facility] 7 am - 330 pm/M-F for direct patient care and secure chat Please refer to Amion for contact information 106 days

## 2020-11-06 LAB — GLUCOSE, CAPILLARY
Glucose-Capillary: 107 mg/dL — ABNORMAL HIGH (ref 70–99)
Glucose-Capillary: 108 mg/dL — ABNORMAL HIGH (ref 70–99)
Glucose-Capillary: 111 mg/dL — ABNORMAL HIGH (ref 70–99)
Glucose-Capillary: 150 mg/dL — ABNORMAL HIGH (ref 70–99)

## 2020-11-06 NOTE — Progress Notes (Signed)
Physical Therapy Treatment Patient Details Name: Antonio Navarro MRN: [id number] DOB: [birth year] Today's Date: 11/06/2020    History of Present Illness Pt is a 60 y.o. male admitted 07/22/20 post-cardiac arrest with STEMI; found to be in V fib with resucitation by EMS in the field, approximately 30-min of CPR. ETT 12/4-12/13. MRI 12/7 suggestive of a widespread anoxic injury. S/p PEG tube 12/30. PMH includes tobacco abuse, HTN, chronic back pain.   PT Comments    Pt with increased fatigue this session, reportedly was up all night. Pt actively resisting attempts to initiate mobility out of bed. Pt's fiance present and supportive; initiated discussion regarding pt's cognitive status and likely need for long-term care with 24/7 supervision for safety. Noted pt tends to work better with familiar therapist, will plan to strategize this next session.    Follow Up Recommendations  SNF;Supervision/Assistance - 24 hour     Equipment Recommendations  Rolling walker with 5" wheels;3in1 (PT);Wheelchair (measurements PT);Wheelchair cushion (measurements PT)    Recommendations for Other Services       Precautions / Restrictions Precautions Precautions: Fall Restrictions Weight Bearing Restrictions: No    Mobility  Bed Mobility Overal bed mobility: Needs Assistance Bed Mobility: Rolling Rolling: Supervision         General bed mobility comments: Pt rolling right/left and repositioning himself in bed without assist; pt not following commands to sit EOB, tactile cues and assist provided to initiate movement, maxA to bring legs to EOB but pt actively resisting trunk elevation, pulling legs back in bed. Scooted up in bed with verbal cues without assist    Transfers                 General transfer comment: unable to progress to standing this session, pt declining and actively resisting movement  Ambulation/Gait                 Stairs             Wheelchair Mobility     Modified Rankin (Stroke Patients Only)       Balance                                            Cognition Arousal/Alertness: Lethargic;Awake/alert Behavior During Therapy: Impulsive Overall Cognitive Status: Impaired/Different from baseline Area of Impairment: Attention;Following commands;Safety/judgement;Awareness;Problem solving                   Current Attention Level: Sustained   Following Commands: Follows one step commands inconsistently Safety/Judgement: Decreased awareness of safety;Decreased awareness of deficits Awareness: Intellectual Problem Solving: Slow processing;Requires verbal cues;Decreased initiation General Comments: Pt keeping eyes closed majority of session, would open to his name, nodding yes/no appropriately. Actively resisting mobility attempts      Exercises      General Comments General comments (skin integrity, edema, etc.): Pt reportedly was awake all night, contributing to fatigue/lethargy this session; pt's fiance present and supportive, encouraged normalizing sleep/wake schedule, opened up blinds to let in sunlight. Initiated discussion with pt's fiance regarding his cognitive status and likely lifelong need for 24/7 assist for safety, options for this (P[facility], [place] aide, ALF, group home)      Pertinent Vitals/Pain Pain Assessment: Faces Faces Pain Scale: No hurt Pain Intervention(s): Monitored during session    Home Living  Prior Function            PT Goals (current goals can now be found in the care plan section) Acute Rehab PT Goals Patient Stated Goal: to go home after rehab PT Goal Formulation: With patient/family Time For Goal Achievement: 11/20/20 Potential to Achieve Goals: Fair Progress towards PT goals: Progressing toward goals    Frequency    Min 3X/week      PT Plan Current plan remains appropriate    Co-evaluation              AM-PAC PT "6 Clicks"  Mobility   Outcome Measure  Help needed turning from your back to your side while in a flat bed without using bedrails?: None Help needed moving from lying on your back to sitting on the side of a flat bed without using bedrails?: A Little Help needed moving to and from a bed to a chair (including a wheelchair)?: A Little Help needed standing up from a chair using your arms (e.g., wheelchair or bedside chair)?: A Little Help needed to walk in hospital room?: A Lot Help needed climbing 3-5 steps with a railing? : A Lot 6 Click Score: 17    End of Session   Activity Tolerance: Patient limited by fatigue Patient left: in bed;with call bell/phone within reach;with bed alarm set;with nursing/sitter in room;with family/visitor present Nurse Communication: Mobility status PT Visit Diagnosis: Other abnormalities of gait and mobility (R26.89);Difficulty in walking, not elsewhere classified (R26.2)     Time: 1207-1221 PT Time Calculation (min) (ACUTE ONLY): 14 min  Charges:  $Self Care/Home Management: [place], PT, DPT Acute Rehabilitation Services  Pager [phone] Office [phone]  Derry Lory 11/06/2020, 1:27 PM

## 2020-11-06 NOTE — Progress Notes (Signed)
[facility] PROGRESS NOTE  Antonio Navarro HBZ:[id number] DOB: 04-Nov-1960 DOA: 07/22/2020 PCP: Patient, No Pcp Per       Status: Remains inpatient appropriate because:Altered mental status and Unsafe d/c plan   Dispo: The patient is from: Home              Anticipated d/c is to: SNF              Anticipated d/c date is: > 3 days              Patient currently is medically stable to d/c.  Barriers to discharge: Medicaid and disability pending. No SNF bed offers.  No further nocturnal agitation or wandering behavior so hopeful will be eligible for SNF bed offer.  Patient able to ambulate 300 feet so hopefully he can eventually discharge home with family as long as they can provide appropriate 24/7 monitoring.  On 2/28 TOC contacted at [facility]   Code Status: Full Family Communication: 3/15 with both myself and Sharyn Lull Case manager spoke at length to patient's Sister [name] about lack of allergy beds and likelihood of needing to pursue placement in a facility outside of the [place] area. DVT prophylaxis: sq heparin Vaccination status: Completed both doses of Pfizer Covid vaccination on 12/14/2019; Rives booster ordered to be given on 3/3 Foley catheter: No  HPI: 60 year old male history of hypertension prescribed Norvasc prior to admission, chronic back pain, prior renal calculi who experienced an out of hospital cardiac arrest with downtime of 9 minutes.  Initial rhythm was ventricular fibrillation.  He was defibrillated deceived 6 doses of epi before ROSC.  Subsequent work-up revealed STEMI.  He underwent cardiac catheterization with a DES placed to the circumflex.  Cardiology following and has placed patient on Brilinta and aspirin and is also being treated with beta-blocker and statins.  From a neurological standpoint he clinically has anoxic brain injury.  During this hospitalization he experienced AKI and was treated with fluids and correction of  hypoperfusion from cardiac arrest.  Echo performed this admission revealed ischemic cardiomyopathy with an EF of 40%  In addition to above patient has been unable to pass swallowing evaluations and subsequently has undergone percutaneous PEG tube placement on 12/30.  Patient is awake and moving all extremities spontaneously but does not follow commands and does not verbalize palliative care has met with patient and family and has emphasized poor prognosis and will continue to meet with the family during the hospitalization.  Current plan is to transition to SNF bed once available.  Expect need for lifelong 24/7 care for all ADLs.  Subjective: Sleeping in. RN stated he had been up late last night.  Objective: Vitals:   11/05/20 1945 11/06/20 0528  BP: 119/86 125/75  Pulse: 81 79  Resp: 18 18  Temp: 98.1 F (36.7 C) 98.3 F (36.8 C)  SpO2: 100% 97%    Intake/Output Summary (Last 24 hours) at 11/06/2020 0739 Last data filed at 11/05/2020 2136 Gross per 24 hour  Intake 880 ml  Output 250 ml  Net 630 ml   Filed Weights   11/05/20 0500 11/06/20 0048 11/06/20 0500  Weight: 84.8 kg 85 kg 85 kg    Exam: Constitutional: Sleeping. NAD Heart: S1S2, pulse regular, ne peripheral edema Respiratory: Lungs clear bilaterally, RA Abdomen: Tol D2 diet,BS +, nontender, LBM 3/19 Neurologic: CN 2-12 intact, exam nonfocal, ambulates w/ assist Psychiatric: Sleeping Skin: Previous areas of flat white lesions have essentially resolved except for a small  cluster in the mid back and these lesions are dry and resolving.  Staff have placed barrier cream on back.   Assessment/Plan: Acute problems: Out of hospital cardiac arrest secondary to STEMI -ROSC and subsequent cardiac catheterization revealed dz in circumflex requiring DES -Continue beta-blocker, statin, Plavix, isosorbide and hydralazine  Significant anoxic brain injury secondary to cardiac arrest/deconditioning/gait instability -Consistently  participating with PT and OT.  SLP has cleared for dysphagia 2 with thin liquids -Declined by CIR since family unable to provide 24/7 care.  Plan is for SNF -Continue Seroquel 100 mg at at bedtime -2/18 add Seroquel 25 mg every 12 hours as needed for agitation and wandering -Continue daytime Symmetrel  **PT NOTES FROM 3/18**  Patient progressing slowly towards PT goals. Pt impulsive and eager to get OOB today as he has been getting up all morning per nursing. Requires min A for bed mobility and transfers; decreased initiation noted. Tolerated gait training with Min A and use of RW with multiple LOB requiring max A to prevent fall due to pt adamantly trying to stay away from own room and jerking walker in different directions. Difficult to redirect at times as pt easily distracted. Poor awareness of safety/deficits. Pt would benefit from getting some fresh air outside if plausible when family present to help with cognition/mental state etc. Will message MD. Will follow.   SLP notes 3/17:     Pt consumed advanced trials of solids with mastication time that appeared to increase as trials continued. He needed Min cues to sustain attention to mastication and to use liquid washes to help clear his oral cavity. At times, there was immediate, weak coughing noted after these liquid washes. Anterior spillage was also noted during these moments when he appeared to have reduced oral control with mixed consistencies. Pt initiated self-feeding a little more today, especially with grabbing for his bottle of water, although he still benefited from Min-Mod cues for this. Pt continues to require fairly consistent cues for initiation of any verbal output with the exception today of a social greeting. Goals updated today - given pt's ongoing, but slow progress, will continue to follow but with frequency adjusted to 1x/week.    Diet recommendations: Dysphagia 2 (fine chop);Thin liquid Liquids provided via:  Cup;Straw Medication Administration: Crushed with puree Supervision: Staff to assist with self feeding;Full supervision/cueing for compensatory strategies Compensations: Slow rate;Small sips/bites;Minimize environmental distractions Postural Changes and/or Swallow Maneuvers: Seated upright 90 degrees   OT notes 3/18:  Pt making good progress towards OT goals, especially in comparison to initial eval. Worked on command following this session and higher level cognitive tasks as pt verbalizing more, stating his name, DOB, fiances name and what he was watching on TV- see cog section. Pt agreeable to functional mobility in hallway this session although pt noted to present with a narrow BOS during ambulation, one LOB noted during transition in hallway needing MIN A to correct. Worked on stating room numbers during ambulation with pt needing increased time and MOD cues to locate and state numbers. MAX cues needed to locate his own room. Pt would continue to benefit from skilled occupational therapy while admitted and after d/c to address the below listed limitations in order to improve overall functional mobility and facilitate independence with BADL participation. DC plan remains appropriate, will follow acutely per POC.     White lesions/rash on back and buttocks -Appeared after patient had laying in tube feeding    10/10/20  10/10/20     10/16/2020     10/20/2020                                     10/20/2020 -As of 3/4 wounds not covered by dressing are drying up and are clearing.  Have opted to remove wounds covered by dressing to allow to dry up and clear as well.  Situational depression -Started Celexa 2/16-increased to 20 mg 2/18  Ischemic cardiomyopathy -Echo this admission EF 40% -Continue hydralazine to 50 TID and increase Isordil to 20 TID -Continue Carvedilol -No ACE inhibitor secondary to underlying kidney disease -Follow-up echocardiogram done March 2022  demonstrates an EF of 40 to 45% with LVH and grade 1 diastolic dysfunction physiology  Hypertension -Was on Norvasc prior to admission -Continue cardiovascular meds as listed above  Acute on chronic kidney disease stage IIIb -Suspect prerenal etiology in the setting of hypoperfusion from ventricular fibrillation arrest -Baseline creatinine between 1.6 and 2-creatinine as of 3/14 stable at 1.33 -Continue free water per tube and intermittently follow labs  Dysphagia, moderate protein calorie malnutrition Nutrition Problem: Increased nutrient needs Etiology: chronic illness (CKD3b) Signs/Symptoms: estimated needs  -Continue D2 diet noting patient with improved oral intake -Discontinue sliding scale insulin since no longer on tube feedings and is not diabetic -Add Ensure beverage twice daily between meals. Interventions: Ensure Enlive (each supplement provides 350kcal and 20 grams of protein),MVI,Magic cup,Prostat,Refer to RD note for recommendations Estimated body mass index is 25.41 kg/m as calculated from the following:   Height as of this encounter: 6' (1.829 m).   Weight as of this encounter: 85 kg. -3/10 discussed with nutritional team patient has demonstrated improved intake of oral food and fluids.  Previously ordered bolus tube feedings have been discontinued.  We will continue Ensure as above    Other problems: Acute hypernatremia -Continue free water per tube  E. coli UTI -Has completed 5 days of Bactrim  Hyperglycemia without an underlying diagnosis of diabetes mellitus Resolved -Likely related to acute stress -Hemoglobin A1c 4.7   Acute hypoxemic respiratory failure -Resolved -Stable on room air with O2 sats between 97 and 99%  Nonsustained VT -Continue amiodarone and beta-blocker -1/31 LFTs normal  Skin tear right lateral back Wound / Incision (Open or Dehisced) 07/28/20 Skin tear Back Lateral;Right;Upper (Active)  Date First Assessed/Time First Assessed:  07/28/20 0300   Wound Type: Skin tear  Location: Back  Location Orientation: Lateral;Right;Upper  Present on Admission: No    Assessments 07/28/2020  7:49 PM 10/29/2020  8:00 AM  Dressing Type None None  Dressing Status -- Dry;Intact  Drainage Amount None --  Treatment Cleansed --     No Linked orders to display    Data Reviewed: Basic Metabolic Panel: No results for input(s): NA, K, CL, CO2, GLUCOSE, BUN, CREATININE, CALCIUM, MG, PHOS in the last 168 hours. Liver Function Tests: No results for input(s): AST, ALT, ALKPHOS, BILITOT, PROT, ALBUMIN in the last 168 hours. No results for input(s): LIPASE, AMYLASE in the last 168 hours. No results for input(s): AMMONIA in the last 168 hours. CBC: No results for input(s): WBC, NEUTROABS, HGB, HCT, MCV, PLT in the last 168 hours. Cardiac Enzymes: No results for input(s): CKTOTAL, CKMB, CKMBINDEX, TROPONINI in the last 168 hours. BNP (last 3 results) No results for input(s): BNP in the last 8760 hours.  ProBNP (last 3 results) No results for input(s): PROBNP in the  last 8760 hours.  CBG: Recent Labs  Lab 11/05/20 0554 11/05/20 1200 11/05/20 1857 11/06/20 0002 11/06/20 0555  GLUCAP 94 142* 184* 111* 107*    No results found for this or any previous visit (from the past 240 hour(s)).   Studies: No results found.  Scheduled Meds: . (feeding supplement) PROSource Plus  30 mL Oral TID BM  . amantadine  100 mg Oral Daily  . aspirin  81 mg Oral Daily  . atorvastatin  80 mg Oral Daily  . carvedilol  25 mg Oral BID WC  . chlorhexidine gluconate (MEDLINE KIT)  15 mL Mouth Rinse BID  . citalopram  20 mg Oral Daily  . clopidogrel  75 mg Oral Daily  . enoxaparin (LOVENOX) injection  40 mg Subcutaneous Q24H  . feeding supplement  237 mL Oral BID BM  . hydrALAZINE  50 mg Oral Q8H  . isosorbide dinitrate  20 mg Oral TID  . mouth rinse  15 mL Mouth Rinse BID  . multivitamin with minerals  1 tablet Oral Daily  . polyvinyl alcohol  1  drop Both Eyes QID  . QUEtiapine  25 mg Oral Daily  . sodium chloride flush  3 mL Intravenous Q12H   Continuous Infusions:   Active Problems:   Acute ST elevation myocardial infarction (STEMI) (HCC)   Cardiac arrest (HCC)   Elevated blood pressure reading with diagnosis of hypertension   Shock circulatory (HCC)   Encephalopathy   History of ETT   Cardiomyopathy, ischemic   Anoxic brain damage ([place])   Dysphagia   DNR (do not resuscitate) discussion   Gait instability   E. coli UTI   Essential hypertension   Consultants:  Cardiology  Interventional radiology  PCCM  Neurology  Procedures:  Cardiac catheterization 12/4  Continuous EEG 12/4 and overnight EEG with video 12/5  Echocardiogram 12/5  EEG 12/9  Percutaneous PEG tube placement 12/30  Antibiotics: Anti-infectives (From admission, onward)   Start     Dose/Rate Route Frequency Ordered Stop   10/14/20 1000  fluconazole (DIFLUCAN) tablet 100 mg        100 mg Oral Daily 10/14/20 0848 10/18/20 0932   09/13/20 1000  sulfamethoxazole-trimethoprim (BACTRIM) 200-40 MG/5ML suspension 20 mL        20 mL Per Tube Every 12 hours 09/13/20 0734 09/19/20 0926   09/13/20 0745  cefTRIAXone (ROCEPHIN) 1 g in sodium chloride 0.9 % 100 mL IVPB  Status:  Discontinued        1 g 200 mL/hr over 30 Minutes Intravenous Every 24 hours 09/13/20 0658 09/13/20 0734   08/17/20 0000  ceFAZolin (ANCEF) IVPB 2g/100 mL premix        2 g 200 mL/hr over 30 Minutes Intravenous To Radiology 08/15/20 1346 08/17/20 1111   07/22/20 0830  cefTRIAXone (ROCEPHIN) 2 g in sodium chloride 0.9 % 100 mL IVPB        2 g 200 mL/hr over 30 Minutes Intravenous Every 24 hours 07/22/20 0720 07/26/20 0858       Time spent: 20 minutes    Erin Hearing ANP  [facility] 7 am - 330 pm/M-F for direct patient care and secure chat Please refer to Amion for contact information 107 days

## 2020-11-06 NOTE — Progress Notes (Signed)
PT refused morning dose of hydralazine. On call provider made aware.

## 2020-11-06 NOTE — Plan of Care (Signed)

## 2020-11-07 LAB — GLUCOSE, CAPILLARY
Glucose-Capillary: 116 mg/dL — ABNORMAL HIGH (ref 70–99)
Glucose-Capillary: 116 mg/dL — ABNORMAL HIGH (ref 70–99)
Glucose-Capillary: 133 mg/dL — ABNORMAL HIGH (ref 70–99)

## 2020-11-07 MED ORDER — HYDRALAZINE HCL 25 MG PO TABS
25.0000 mg | ORAL_TABLET | Freq: Three times a day (TID) | ORAL | Status: DC
  Administered 2020-11-07 – 2020-11-09 (×7): 25 mg via ORAL
  Filled 2020-11-07 (×9): qty 1

## 2020-11-07 MED ORDER — CARVEDILOL 12.5 MG PO TABS
12.5000 mg | ORAL_TABLET | Freq: Two times a day (BID) | ORAL | Status: DC
  Administered 2020-11-07 – 2020-11-09 (×5): 12.5 mg via ORAL
  Filled 2020-11-07 (×7): qty 1

## 2020-11-07 MED ORDER — ISOSORBIDE DINITRATE 10 MG PO TABS
10.0000 mg | ORAL_TABLET | Freq: Three times a day (TID) | ORAL | Status: DC
  Administered 2020-11-07 – 2020-11-09 (×7): 10 mg via ORAL
  Filled 2020-11-07 (×9): qty 1

## 2020-11-07 NOTE — Progress Notes (Addendum)
[facility] PROGRESS NOTE  Antonio GRAMM CBJ:[id number] DOB: Mar 12, 1961 DOA: 07/22/2020 PCP: Patient, No Pcp Per       Status: Remains inpatient appropriate because:Altered mental status and Unsafe d/c plan   Dispo: The patient is from: Home              Anticipated d/c is to: SNF              Anticipated d/c date is: > 3 days              Patient currently is medically stable to d/c.  Barriers to discharge: Medicaid and disability pending. No SNF bed offers.  No further nocturnal agitation or wandering behavior so hopeful will be eligible for SNF bed offer.  Patient able to ambulate 300 feet so hopefully he can eventually discharge home with family as long as they can provide appropriate 24/7 monitoring.  On 2/28 TOC contacted at [facility]   Code Status: Full Family Communication: 3/22 updated Sister [name] by telephone DVT prophylaxis: sq heparin Vaccination status: Completed both doses of Pfizer Covid vaccination on 12/14/2019; [place] booster ordered to be given on 3/3   Foley catheter: No  HPI: 60 year old male history of hypertension prescribed Norvasc prior to admission, chronic back pain, prior renal calculi who experienced an out of hospital cardiac arrest with downtime of 9 minutes.  Initial rhythm was ventricular fibrillation.  He was defibrillated deceived 6 doses of epi before ROSC.  Subsequent work-up revealed STEMI.  He underwent cardiac catheterization with a DES placed to the circumflex.  Cardiology following and has placed patient on Brilinta and aspirin and is also being treated with beta-blocker and statins.  From a neurological standpoint he clinically has anoxic brain injury.  During this hospitalization he experienced AKI and was treated with fluids and correction of hypoperfusion from cardiac arrest.  Echo performed this admission revealed ischemic cardiomyopathy with an EF of 40%  In addition to above patient has been unable to pass  swallowing evaluations and subsequently has undergone percutaneous PEG tube placement on 12/30.  Patient is awake and moving all extremities spontaneously but does not follow commands and does not verbalize palliative care has met with patient and family and has emphasized poor prognosis and will continue to meet with the family during the hospitalization.  Current plan is to transition to SNF bed once available.  Expect need for lifelong 24/7 care for all ADLs.  Subjective: Patient awake.  Smiling.  Attempting to verbally communicate but speech incoherent.  Motioning that he wants padding behind back adjusted.  Objective: Vitals:   11/06/20 1948 11/07/20 0419  BP: 113/74 119/89  Pulse: 77 89  Resp: 18 18  Temp: 98.1 F (36.7 C) 98.9 F (37.2 C)  SpO2: 97% 100%    Intake/Output Summary (Last 24 hours) at 11/07/2020 0805 Last data filed at 11/07/2020 0422 Gross per 24 hour  Intake 590 ml  Output 620 ml  Net -30 ml   Filed Weights   11/06/20 0048 11/06/20 0500 11/07/20 0419  Weight: 85 kg 85 kg 84.6 kg    Exam: Constitutional: Calm, no acute distress Heart: Normal heart sounds, pulse regular, no peripheral edema Respiratory: Lungs are clear to auscultation, stable on room air she is Abdomen: LBM 3/19, soft nontender nondistended.  PEG tube in place and clamped.  Bowel sounds present. Neurologic: CN 2-12 intact, no focal neurological deficits Psychiatric:  Skin: Previous areas of flat white lesions have resolved   Assessment/Plan:  Acute problems: Out of hospital cardiac arrest secondary to STEMI -ROSC and subsequent cardiac catheterization revealed dz in circumflex requiring DES -Continue beta-blocker, statin, Plavix, isosorbide and hydralazine  Significant anoxic brain injury secondary to cardiac arrest/deconditioning/gait instability -Consistently participating with PT and OT.  SLP has cleared for dysphagia 2 with thin liquids -Declined by CIR since family unable to  provide 24/7 care.  Plan is for SNF -Continue Seroquel 100 mg at at bedtime -2/18 add Seroquel 25 mg every 12 hours as needed for agitation and wandering -Continue daytime Symmetrel  **PT NOTES FROM 3/21**  Pt with increased fatigue this session, reportedly was up all night. Pt actively resisting attempts to initiate mobility out of bed. Pt's fiance present and supportive; initiated discussion regarding pt's cognitive status and likely need for long-term care with 24/7 supervision for safety. Noted pt tends to work better with familiar therapist, will plan to strategize this next session.   SLP notes 3/17:     Pt consumed advanced trials of solids with mastication time that appeared to increase as trials continued. He needed Min cues to sustain attention to mastication and to use liquid washes to help clear his oral cavity. At times, there was immediate, weak coughing noted after these liquid washes. Anterior spillage was also noted during these moments when he appeared to have reduced oral control with mixed consistencies. Pt initiated self-feeding a little more today, especially with grabbing for his bottle of water, although he still benefited from Min-Mod cues for this. Pt continues to require fairly consistent cues for initiation of any verbal output with the exception today of a social greeting. Goals updated today - given pt's ongoing, but slow progress, will continue to follow but with frequency adjusted to 1x/week.    Diet recommendations: Dysphagia 2 (fine chop);Thin liquid Liquids provided via: Cup;Straw Medication Administration: Crushed with puree Supervision: Staff to assist with self feeding;Full supervision/cueing for compensatory strategies Compensations: Slow rate;Small sips/bites;Minimize environmental distractions Postural Changes and/or Swallow Maneuvers: Seated upright 90 degrees   OT notes 3/18:  Pt making good progress towards OT goals, especially in comparison to  initial eval. Worked on command following this session and higher level cognitive tasks as pt verbalizing more, stating his name, DOB, fiances name and what he was watching on TV- see cog section. Pt agreeable to functional mobility in hallway this session although pt noted to present with a narrow BOS during ambulation, one LOB noted during transition in hallway needing MIN A to correct. Worked on stating room numbers during ambulation with pt needing increased time and MOD cues to locate and state numbers. MAX cues needed to locate his own room. Pt would continue to benefit from skilled occupational therapy while admitted and after d/c to address the below listed limitations in order to improve overall functional mobility and facilitate independence with BADL participation. DC plan remains appropriate, will follow acutely per POC.     White lesions/rash on back and buttocks -Appeared after patient had laying in tube feeding    10/10/20                           10/10/20     10/16/2020     10/20/2020                                     10/20/2020 -Rash has resolved  Situational depression -  Started Celexa 2/16-increased to 20 mg 2/18  Ischemic cardiomyopathy -Echo this admission EF 40% -Has redeveloped suboptimal blood pressure readings therefore will decrease hydralazine to 25 TID and increase Isordil to 10 TID -Decrease carvedilol to 12.5 twice daily -No ACE inhibitor secondary to underlying kidney disease -Follow-up echocardiogram done March 2022 demonstrates an EF of 40 to 45% with LVH and grade 1 diastolic dysfunction physiology  Hypertension -Was on Norvasc prior to admission -Continue cardiovascular meds as listed above  Acute on chronic kidney disease stage IIIb -Suspect prerenal etiology in the setting of hypoperfusion from ventricular fibrillation arrest -Baseline creatinine between 1.6 and 2-creatinine as of 3/14 stable at 1.33 -Continue free water per tube and intermittently  follow labs  Dysphagia, moderate protein calorie malnutrition Nutrition Problem: Increased nutrient needs Etiology: chronic illness (CKD3b) Signs/Symptoms: estimated needs  -Continue D2 diet noting patient with improved oral intake -Discontinue sliding scale insulin since no longer on tube feedings and is not diabetic -Add Ensure beverage twice daily between meals. Interventions: Ensure Enlive (each supplement provides 350kcal and 20 grams of protein),MVI,Magic cup,Prostat,Refer to RD note for recommendations Estimated body mass index is 25.3 kg/m as calculated from the following:   Height as of this encounter: 6' (1.829 m).   Weight as of this encounter: 84.6 kg. -3/10 discussed with nutritional team patient has demonstrated improved intake of oral food and fluids.  Previously ordered bolus tube feedings have been discontinued.  We will continue Ensure as above    Other problems: Acute hypernatremia -Continue free water per tube  E. coli UTI -Has completed 5 days of Bactrim  Hyperglycemia without an underlying diagnosis of diabetes mellitus Resolved -Likely related to acute stress -Hemoglobin A1c 4.7   Acute hypoxemic respiratory failure -Resolved -Stable on room air with O2 sats between 97 and 99%  Nonsustained VT -Continue amiodarone and beta-blocker -1/31 LFTs normal  Skin tear right lateral back Wound / Incision (Open or Dehisced) 07/28/20 Skin tear Back Lateral;Right;Upper (Active)  Date First Assessed/Time First Assessed: 07/28/20 0300   Wound Type: Skin tear  Location: Back  Location Orientation: Lateral;Right;Upper  Present on Admission: No    Assessments 07/28/2020  7:49 PM 10/29/2020  8:00 AM  Dressing Type None None  Dressing Status - Dry;Intact  Drainage Amount None -  Treatment Cleansed -     No Linked orders to display    Data Reviewed: Basic Metabolic Panel: No results for input(s): NA, K, CL, CO2, GLUCOSE, BUN, CREATININE, CALCIUM, MG, PHOS in the  last 168 hours. Liver Function Tests: No results for input(s): AST, ALT, ALKPHOS, BILITOT, PROT, ALBUMIN in the last 168 hours. No results for input(s): LIPASE, AMYLASE in the last 168 hours. No results for input(s): AMMONIA in the last 168 hours. CBC: No results for input(s): WBC, NEUTROABS, HGB, HCT, MCV, PLT in the last 168 hours. Cardiac Enzymes: No results for input(s): CKTOTAL, CKMB, CKMBINDEX, TROPONINI in the last 168 hours. BNP (last 3 results) No results for input(s): BNP in the last 8760 hours.  ProBNP (last 3 results) No results for input(s): PROBNP in the last 8760 hours.  CBG: Recent Labs  Lab 11/06/20 0555 11/06/20 1220 11/06/20 1811 11/07/20 0012 11/07/20 0605  GLUCAP 107* 108* 150* 116* 116*    No results found for this or any previous visit (from the past 240 hour(s)).   Studies: No results found.  Scheduled Meds: . (feeding supplement) PROSource Plus  30 mL Oral TID BM  . amantadine  100 mg Oral Daily  .  aspirin  81 mg Oral Daily  . atorvastatin  80 mg Oral Daily  . carvedilol  25 mg Oral BID WC  . chlorhexidine gluconate (MEDLINE KIT)  15 mL Mouth Rinse BID  . citalopram  20 mg Oral Daily  . clopidogrel  75 mg Oral Daily  . enoxaparin (LOVENOX) injection  40 mg Subcutaneous Q24H  . feeding supplement  237 mL Oral BID BM  . hydrALAZINE  50 mg Oral Q8H  . isosorbide dinitrate  20 mg Oral TID  . mouth rinse  15 mL Mouth Rinse BID  . multivitamin with minerals  1 tablet Oral Daily  . polyvinyl alcohol  1 drop Both Eyes QID  . QUEtiapine  25 mg Oral Daily  . sodium chloride flush  3 mL Intravenous Q12H   Continuous Infusions:   Active Problems:   Acute ST elevation myocardial infarction (STEMI) (HCC)   Cardiac arrest (HCC)   Elevated blood pressure reading with diagnosis of hypertension   Shock circulatory (HCC)   Encephalopathy   History of ETT   Cardiomyopathy, ischemic   Anoxic brain damage ([place])   Dysphagia   DNR (do not resuscitate)  discussion   Gait instability   E. coli UTI   Essential hypertension   Consultants:  Cardiology  Interventional radiology  PCCM  Neurology  Procedures:  Cardiac catheterization 12/4  Continuous EEG 12/4 and overnight EEG with video 12/5  Echocardiogram 12/5  EEG 12/9  Percutaneous PEG tube placement 12/30  Antibiotics: Anti-infectives (From admission, onward)   Start     Dose/Rate Route Frequency Ordered Stop   10/14/20 1000  fluconazole (DIFLUCAN) tablet 100 mg        100 mg Oral Daily 10/14/20 0848 10/18/20 0932   09/13/20 1000  sulfamethoxazole-trimethoprim (BACTRIM) 200-40 MG/5ML suspension 20 mL        20 mL Per Tube Every 12 hours 09/13/20 0734 09/19/20 0926   09/13/20 0745  cefTRIAXone (ROCEPHIN) 1 g in sodium chloride 0.9 % 100 mL IVPB  Status:  Discontinued        1 g 200 mL/hr over 30 Minutes Intravenous Every 24 hours 09/13/20 0658 09/13/20 0734   08/17/20 0000  ceFAZolin (ANCEF) IVPB 2g/100 mL premix        2 g 200 mL/hr over 30 Minutes Intravenous To Radiology 08/15/20 1346 08/17/20 1111   07/22/20 0830  cefTRIAXone (ROCEPHIN) 2 g in sodium chloride 0.9 % 100 mL IVPB        2 g 200 mL/hr over 30 Minutes Intravenous Every 24 hours 07/22/20 0720 07/26/20 0858       Time spent: 20 minutes    Erin Hearing ANP  [facility] 7 am - 330 pm/M-F for direct patient care and secure chat Please refer to Amion for contact information 108 days

## 2020-11-07 NOTE — Progress Notes (Signed)
Pt didn't want to take any of his morning medications even if he was alert. Made MD aware.

## 2020-11-07 NOTE — Progress Notes (Signed)
  Speech Language Pathology Treatment: Cognitive-Linquistic  Patient Details Name: Antonio Navarro MRN: [id number] DOB: 1961/04/21 Today's Date: 11/07/2020 Time: 4098-1191 SLP Time Calculation (min) (ACUTE ONLY): 16 min  Assessment / Plan / Recommendation Clinical Impression  Pt was seen primarily for cognitive-linguistic tx, taking a single bite of puree off his breakfast tray but declining the rest of it. Tx focused on receptive identification and reading at the word level, with pt matching single words to objects on his tray. Pt needed distractions reduced in his environment to provide a field of two, and matched each word to the correct item when given extra time. When pt was given two words at a time he had increased difficulty deciphering which word to put with each item, needing Mod cues from SLP. Only Min cues were provided for accuracy with reading. Pt will benefit from ongoing therapy to address cognitive-linguistic and swallowing goals.    HPI HPI: Pt is a 60 yo male s/p arrest with downtime 9 minutes. Initial rhythm was V. fib, shocked and received 6 rounds of epi before ROSC achieved.  Total CPR time was 30 minutes. ETT 12/4-12/13. MRI 12/7 suggestive of a widespread anoxic injury. PMH includes kidney stones and HTN.      SLP Plan  Continue with current plan of care       Recommendations  Diet recommendations: Dysphagia 2 (fine chop);Thin liquid Liquids provided via: Cup;Straw Medication Administration: Crushed with puree Supervision: Staff to assist with self feeding;Full supervision/cueing for compensatory strategies Compensations: Slow rate;Small sips/bites;Minimize environmental distractions Postural Changes and/or Swallow Maneuvers: Seated upright 90 degrees                Oral Care Recommendations: Oral care BID Follow up Recommendations: Skilled Nursing facility SLP Visit Diagnosis: Cognitive communication deficit (Y78.295) Plan: Continue with current plan of  care       GO                Osie Bond., M.A. [facility] Pager [phone] Office [phone]  11/07/2020, 2:55 PM

## 2020-11-08 ENCOUNTER — Inpatient Hospital Stay (HOSPITAL_COMMUNITY): Payer: Medicaid Other

## 2020-11-08 HISTORY — PX: IR GASTROSTOMY TUBE REMOVAL: IMG5492

## 2020-11-08 LAB — GLUCOSE, CAPILLARY
Glucose-Capillary: 116 mg/dL — ABNORMAL HIGH (ref 70–99)
Glucose-Capillary: 117 mg/dL — ABNORMAL HIGH (ref 70–99)
Glucose-Capillary: 124 mg/dL — ABNORMAL HIGH (ref 70–99)

## 2020-11-08 MED ORDER — LIDOCAINE VISCOUS HCL 2 % MT SOLN
OROMUCOSAL | Status: AC
  Filled 2020-11-08: qty 15

## 2020-11-08 NOTE — Progress Notes (Signed)
Occupational Therapy Treatment Patient Details Name: Antonio Navarro MRN: [id number] DOB: 1961-08-10 Today's Date: 11/08/2020    History of present illness Pt is a 60 y.o. male admitted 07/22/20 post-cardiac arrest with STEMI; found to be in V fib with resucitation by EMS in the field, approximately 30-min of CPR. ETT 12/4-12/13. MRI 12/7 suggestive of a widespread anoxic injury. S/p PEG tube 12/30. PMH includes tobacco abuse, HTN, chronic back pain.   OT comments  Pt making steady progress towards OT goals this session. Session focus on following commands during cognitive tasks such as engaging in tic-tac-toe game, handwriting and completing table top search and find activity ( see cog section). Pt able to follow one steps and 2 steps command with increased time and step by step cues to problem solve through sequencing all tasks. Pts fiance reports that her goal for the pt is for him to be able to verbalize toileting needs. Will follow acutely for OT needs.    Follow Up Recommendations  SNF;Supervision/Assistance - 24 hour    Equipment Recommendations  Wheelchair (measurements OT);Wheelchair cushion (measurements OT);Hospital bed;3 in 1 bedside commode    Recommendations for Other Services      Precautions / Restrictions Precautions Precautions: Fall Restrictions Weight Bearing Restrictions: No       Mobility Bed Mobility Overal bed mobility: Needs Assistance Bed Mobility: Supine to Sit;Sit to Supine     Supine to sit: Min guard;HOB elevated Sit to supine: Min guard;HOB elevated   General bed mobility comments: light minguard for safety and line mgmt, tactile cues helpful at times    Transfers Overall transfer level: Needs assistance Equipment used: Rolling walker (2 wheeled) Transfers: Sit to/from Stand Sit to Stand: Mod assist         General transfer comment: MOD A to stand from EOB d/t pt being resistant    Balance Overall balance assessment: Needs  assistance Sitting-balance support: Feet supported;No upper extremity supported Sitting balance-Leahy Scale: Good     Standing balance support: Bilateral upper extremity supported;During functional activity Standing balance-Leahy Scale: Poor Standing balance comment: BUE support for mobility this session                           ADL either performed or assessed with clinical judgement   ADL Overall ADL's : Needs assistance/impaired                     Lower Body Dressing: Minimal assistance;Sit to/from stand Lower Body Dressing Details (indicate cue type and reason): light MIN A to push heels fully into crocs Toilet Transfer: Minimal assistance;RW;Ambulation Toilet Transfer Details (indicate cue type and reason): simulated via functional mobility with RW with pt needing increased cues to widen BOS as pt presents with very narrow BOS. MIN A for overall balance, increased steadying assist needed for transitional turns         Functional mobility during ADLs: Minimal assistance;Cueing for safety;Rolling walker General ADL Comments: fiance reports pt is now feeding himself, and requries at least set- up assist for grooming tasks, pt following one step commands with increased time and MOD cues for problem solving cog tasks, pt using ADL items appropriately     Vision       Perception     Praxis      Cognition Arousal/Alertness: Awake/alert Behavior During Therapy: WFL for tasks assessed/performed Overall Cognitive Status: Impaired/Different from baseline Area of Impairment: Attention;Following commands;Safety/judgement;Awareness;Problem solving  Current Attention Level: Sustained   Following Commands: Follows one step commands consistently;Follows multi-step commands with increased time Safety/Judgement: Decreased awareness of safety;Decreased awareness of deficits Awareness: Intellectual Problem Solving: Requires verbal cues;Slow  processing General Comments: pt able to follow commands in order to play tic- tac-toe game, pt required set-up of pen in hand and step by step cues to problem solve through turn taking as well as overall sequencing of game such as objective of game etc. pt following multistep commands related to table top task with increased time. Pt able to locate all appropriate shapes on paper during search and find activity. Pt also able to write initials on paper.         Exercises     Shoulder Instructions       General Comments      Pertinent Vitals/ Pain       Pain Assessment: No/denies pain  Home Living                                          Prior Functioning/Environment              Frequency  Min 2X/week        Progress Toward Goals  OT Goals(current goals can now be found in the care plan section)  Progress towards OT goals: Progressing toward goals  Acute Rehab OT Goals Patient Stated Goal: pt fiance would like him to get intensive rehab OT Goal Formulation: With patient/family Time For Goal Achievement: 11/10/20 (seen by OTR 3/11) Potential to Achieve Goals: [place] Discharge plan remains appropriate;Frequency remains appropriate    Co-evaluation                 AM-PAC OT "6 Clicks" Daily Activity     Outcome Measure   Help from another person eating meals?: A Little (s/u) Help from another person taking care of personal grooming?: A Little (s/u) Help from another person toileting, which includes using toliet, bedpan, or urinal?: A Little Help from another person bathing (including washing, rinsing, drying)?: A Lot Help from another person to put on and taking off regular upper body clothing?: A Little Help from another person to put on and taking off regular lower body clothing?: A Little 6 Click Score: 17    End of Session Equipment Utilized During Treatment: Gait belt;Rolling walker  OT Visit Diagnosis: Muscle weakness  (generalized) (M62.81);Pain;Other abnormalities of gait and mobility (R26.89);Cognitive communication deficit (R41.841);Low vision, both eyes (H54.2) Symptoms and signs involving cognitive functions: Other Nontraumatic ICH   Activity Tolerance Patient tolerated treatment well   Patient Left in bed;with call bell/phone within reach;with family/visitor present   Nurse Communication Mobility status        Time: 1601-1630 OT Time Calculation (min): 29 min  Charges: OT General Charges $OT Visit: 1 Visit OT Treatments $Therapeutic Activity: 23-37 mins  [name]., COTA/L Acute Rehabilitation Services [phone]    Precious Haws 11/08/2020, 5:03 PM

## 2020-11-08 NOTE — Progress Notes (Signed)
Patient tube feedings stopped, tube removed in IR, gauze in place- clean and dry, with complaint of pain. Flushes and CBG's discontinued.  Family aware.

## 2020-11-08 NOTE — Progress Notes (Signed)
CSW spoke with Ebony Hail at [facility] who states [facility] and facility administration are discussing the financial requirements of this patient's care to determine if a bed offer can be made.  [name], MSW, LCSW Transitions of Care  Clinical Social Worker II [phone]

## 2020-11-08 NOTE — Progress Notes (Signed)
[facility] PROGRESS NOTE  Antonio Navarro UGQ:[id number] DOB: 17-Jan-1961 DOA: 07/22/2020 PCP: Antonio Navarro       Status: Remains inpatient appropriate because:Altered mental status and Unsafe d/c plan   Dispo: The patient is from: Home              Anticipated d/c is to: SNF              Anticipated d/c date is: > 3 days              Patient currently is medically stable to d/c.  Barriers to discharge: Medicaid and disability pending. No SNF bed offers.  No further nocturnal agitation or wandering behavior so hopeful will be eligible for SNF bed offer.  Patient able to ambulate 300 feet so hopefully he can eventually discharge home with family as long as they can provide appropriate 24/7 monitoring.  On 2/28 TOC contacted at [facility]   Code Status: Full Family Communication: 3/22 updated Sister Antonio Navarro by telephone DVT prophylaxis: sq heparin Vaccination status: Completed both doses of Pfizer Covid vaccination on 12/14/2019; [place] booster ordered to be given on 3/3   Foley catheter: No  HPI: 60 year old male history of hypertension prescribed Norvasc prior to admission, chronic back pain, prior renal calculi who experienced an out of hospital cardiac arrest with downtime of 9 minutes.  Initial rhythm was ventricular fibrillation.  He was defibrillated deceived 6 doses of epi before ROSC.  Subsequent work-up revealed STEMI.  He underwent cardiac catheterization with a DES placed to the circumflex.  Cardiology following and has placed patient on Brilinta and aspirin and is also being treated with beta-blocker and statins.  From a neurological standpoint he clinically has anoxic brain injury.  During this hospitalization he experienced AKI and was treated with fluids and correction of hypoperfusion from cardiac arrest.  Echo performed this admission revealed ischemic cardiomyopathy with an EF of 40%  In addition to above patient has been unable to pass  swallowing evaluations and subsequently has undergone percutaneous PEG tube placement on 12/30.  Patient is awake and moving all extremities spontaneously but does not follow commands and does not verbalize palliative care has met with patient and family and has emphasized poor prognosis and will continue to meet with the family during the hospitalization.  Current plan is to transition to SNF bed once available.  Expect need for lifelong 24/7 care for all ADLs.  Subjective: Patient very sleepy this morning.  Did smile briefly but went back to sleep.  Could not get him to interact.  Nursing states he was again up all night and spent most of the night walking up and down the hallways.  Not demonstrating true wandering behavior just was bored and had difficulty sleeping.  Objective: Vitals:   11/07/20 1948 11/08/20 0439  BP: 136/77 (!) 164/92  Pulse: 98 80  Resp: 18 20  Temp: 98.2 F (36.8 C) 98.4 F (36.9 C)  SpO2: 99% 100%    Intake/Output Summary (Last 24 hours) at 11/08/2020 0752 Last data filed at 11/08/2020 0017 Gross Navarro 24 hour  Intake 820 ml  Output 150 ml  Net 670 ml   Filed Weights   11/06/20 0500 11/07/20 0419 11/08/20 0016  Weight: 85 kg 84.6 kg 86.6 kg    Exam: Constitutional: Sleeping but awakened and was pleasant. Heart: Normal heart sounds, skin warm and dry.  Pulse regular and he is normotensive with improved blood pressure readings after adjustments in  medications on 3/22 Respiratory: Lung sounds are clear and he is stable on room air Abdomen: LBM 3/19, abdomen soft.  Eating adequately.  Nursing states he is taking medications by mouth.  PEG tube unremarkable. Neurologic: CN 2-12 intact, no focal neurological deficits Psychiatric: Sleeping.  Did awaken.  Was not very interactive so unable to accurately assess orientation. Skin: Previous areas of flat white lesions have resolved   Assessment/Plan: Acute problems: Out of hospital cardiac arrest secondary to  STEMI -ROSC and subsequent cardiac catheterization revealed dz in circumflex requiring DES -Continue beta-blocker, statin, Plavix, isosorbide and hydralazine  Significant anoxic brain injury secondary to cardiac arrest/deconditioning/gait instability -Consistently participating with PT and OT.  SLP has cleared for dysphagia 2 with thin liquids -Declined by CIR since family unable to provide 24/7 care.  Plan is for SNF -Continue Seroquel 100 mg at at bedtime -2/18 add Seroquel 25 mg every 12 hours as needed for agitation and wandering -Continue daytime Symmetrel  -Patient is a night owl and has difficulty sleeping at night.  At times he is very bored and walks the halls.  Because he has been up all night/most of the night he prefers to sleep in therefore after discussion with the nurse we have opted to change his a.m. dosing of Mansel to 10 AM and they can be given later if necessary. **PT NOTES FROM 3/23**  Pt received up in the chair; agreeable, interactive and laughing with PT. Session focused on continued gait/balance training and cognitive remediation. Pt ambulating hallway distances with a walker at a min guard assist level. Requires consistent cueing for obstacle negotiation and often runs into objects on the right. Presents as a high fall risk based on decreased gait speed and safety awareness. Continue to recommend SNF for ongoing Physical Therapy.     SLP notes 3/22:   Pt was seen primarily for cognitive-linguistic tx, taking a single bite of puree off his breakfast tray but declining the rest of it. Tx focused on receptive identification and reading at the word level, with pt matching single words to objects on his tray. Pt needed distractions reduced in his environment to provide a field of two, and matched each word to the correct item when given extra time. When pt was given two words at a time he had increased difficulty deciphering which word to put with each item, needing Mod cues  from SLP. Only Min cues were provided for accuracy with reading. Pt will benefit from ongoing therapy to address cognitive-linguistic and swallowing goals.    OT notes 3/18:  Pt making good progress towards OT goals, especially in comparison to initial eval. Worked on command following this session and higher level cognitive tasks as pt verbalizing more, stating his name, DOB, fiances name and what he was watching on TV- see cog section. Pt agreeable to functional mobility in hallway this session although pt noted to present with a narrow BOS during ambulation, one LOB noted during transition in hallway needing MIN A to correct. Worked on stating room numbers during ambulation with pt needing increased time and MOD cues to locate and state numbers. MAX cues needed to locate his own room. Pt would continue to benefit from skilled occupational therapy while admitted and after d/c to address the below listed limitations in order to improve overall functional mobility and facilitate independence with BADL participation. DC plan remains appropriate, will follow acutely Navarro POC.     White lesions/rash on back and buttocks -Appeared after patient had  laying in tube feeding    10/10/20                           10/10/20     10/16/2020     10/20/2020                                     10/20/2020 -Rash has resolved  Situational depression -Started Celexa 2/16-increased to 20 mg 2/18  Ischemic cardiomyopathy -Echo this admission EF 40% -Has redeveloped suboptimal blood pressure readings therefore will decrease hydralazine to 25 TID and increase Isordil to 10 TID -Decrease carvedilol to 12.5 twice daily -No ACE inhibitor secondary to underlying kidney disease -Follow-up echocardiogram done March 2022 demonstrates an EF of 40 to 45% with LVH and grade 1 diastolic dysfunction physiology  Hypertension -Was on Norvasc prior to admission -Continue cardiovascular meds as listed above  Acute on chronic kidney  disease stage IIIb -Suspect prerenal etiology in the setting of hypoperfusion from ventricular fibrillation arrest -Baseline creatinine between 1.6 and 2-creatinine as of 3/14 stable at 1.33 -Continue free water Navarro tube and intermittently follow labs  Dysphagia, moderate protein calorie malnutrition Nutrition Problem: Increased nutrient needs Etiology: chronic illness (CKD3b) Signs/Symptoms: estimated needs  -Continue D2 diet noting patient with improved oral intake -Discontinue sliding scale insulin since no longer on tube feedings and is not diabetic -Add Ensure beverage twice daily between meals. Interventions: Ensure Enlive (each supplement provides 350kcal and 20 grams of protein),MVI,Magic cup,Prostat,Refer to RD note for recommendations Estimated body mass index is 25.89 kg/m as calculated from the following:   Height as of this encounter: 6' (1.829 m).   Weight as of this encounter: 86.6 kg. -Order placed on 3/23 for IR to evaluate patient and proceed with PEG tube removal since patient is tolerating medications and diet by mouth.    Other problems: Acute hypernatremia -Continue free water Navarro tube  E. coli UTI -Has completed 5 days of Bactrim  Hyperglycemia without an underlying diagnosis of diabetes mellitus Resolved -Likely related to acute stress -Hemoglobin A1c 4.7   Acute hypoxemic respiratory failure -Resolved -Stable on room air with O2 sats between 97 and 99%  Nonsustained VT -Continue amiodarone and beta-blocker -1/31 LFTs normal  Skin tear right lateral back Wound / Incision (Open or Dehisced) 07/28/20 Skin tear Back Lateral;Right;Upper (Active)  Date First Assessed/Time First Assessed: 07/28/20 0300   Wound Type: Skin tear  Location: Back  Location Orientation: Lateral;Right;Upper  Present on Admission: No    Assessments 07/28/2020  7:49 PM 11/07/2020  9:54 PM  Dressing Type None None  Drainage Amount None --  Treatment Cleansed --     No Linked  orders to display    Data Reviewed: Basic Metabolic Panel: No results for input(s): NA, K, CL, CO2, GLUCOSE, BUN, CREATININE, CALCIUM, MG, PHOS in the last 168 hours. Liver Function Tests: No results for input(s): AST, ALT, ALKPHOS, BILITOT, PROT, ALBUMIN in the last 168 hours. No results for input(s): LIPASE, AMYLASE in the last 168 hours. No results for input(s): AMMONIA in the last 168 hours. CBC: No results for input(s): WBC, NEUTROABS, HGB, HCT, MCV, PLT in the last 168 hours. Cardiac Enzymes: No results for input(s): CKTOTAL, CKMB, CKMBINDEX, TROPONINI in the last 168 hours. BNP (last 3 results) No results for input(s): BNP in the last 8760 hours.  ProBNP (last 3 results) No results  for input(s): PROBNP in the last 8760 hours.  CBG: Recent Labs  Lab 11/07/20 0012 11/07/20 0605 11/07/20 1217 11/08/20 0013 11/08/20 0607  GLUCAP 116* 116* 133* 116* 117*    No results found for this or any previous visit (from the past 240 hour(s)).   Studies: No results found.  Scheduled Meds: . (feeding supplement) PROSource Plus  30 mL Oral TID BM  . amantadine  100 mg Oral Daily  . aspirin  81 mg Oral Daily  . atorvastatin  80 mg Oral Daily  . carvedilol  12.5 mg Oral BID WC  . chlorhexidine gluconate (MEDLINE KIT)  15 mL Mouth Rinse BID  . citalopram  20 mg Oral Daily  . clopidogrel  75 mg Oral Daily  . enoxaparin (LOVENOX) injection  40 mg Subcutaneous Q24H  . feeding supplement  237 mL Oral BID BM  . hydrALAZINE  25 mg Oral Q8H  . isosorbide dinitrate  10 mg Oral TID  . mouth rinse  15 mL Mouth Rinse BID  . multivitamin with minerals  1 tablet Oral Daily  . polyvinyl alcohol  1 drop Both Eyes QID  . QUEtiapine  25 mg Oral Daily  . sodium chloride flush  3 mL Intravenous Q12H   Continuous Infusions:   Active Problems:   Acute ST elevation myocardial infarction (STEMI) (HCC)   Cardiac arrest (HCC)   Elevated blood pressure reading with diagnosis of hypertension    Shock circulatory (HCC)   Encephalopathy   History of ETT   Cardiomyopathy, ischemic   Anoxic brain damage ([place])   Dysphagia   DNR (do not resuscitate) discussion   Gait instability   E. coli UTI   Essential hypertension   Consultants:  Cardiology  Interventional radiology  PCCM  Neurology  Procedures:  Cardiac catheterization 12/4  Continuous EEG 12/4 and overnight EEG with video 12/5  Echocardiogram 12/5  EEG 12/9  Percutaneous PEG tube placement 12/30  Antibiotics: Anti-infectives (From admission, onward)   Start     Dose/Rate Route Frequency Ordered Stop   10/14/20 1000  fluconazole (DIFLUCAN) tablet 100 mg        100 mg Oral Daily 10/14/20 0848 10/18/20 0932   09/13/20 1000  sulfamethoxazole-trimethoprim (BACTRIM) 200-40 MG/5ML suspension 20 mL        20 mL Navarro Tube Every 12 hours 09/13/20 0734 09/19/20 0926   09/13/20 0745  cefTRIAXone (ROCEPHIN) 1 g in sodium chloride 0.9 % 100 mL IVPB  Status:  Discontinued        1 g 200 mL/hr over 30 Minutes Intravenous Every 24 hours 09/13/20 0658 09/13/20 0734   08/17/20 0000  ceFAZolin (ANCEF) IVPB 2g/100 mL premix        2 g 200 mL/hr over 30 Minutes Intravenous To Radiology 08/15/20 1346 08/17/20 1111   07/22/20 0830  cefTRIAXone (ROCEPHIN) 2 g in sodium chloride 0.9 % 100 mL IVPB        2 g 200 mL/hr over 30 Minutes Intravenous Every 24 hours 07/22/20 0720 07/26/20 0858       Time spent: 20 minutes    Erin Hearing ANP  [facility] 7 am - 330 pm/M-F for direct patient care and secure chat Please refer to Amion for contact information 109 days

## 2020-11-08 NOTE — Progress Notes (Signed)
Physical Therapy Treatment Patient Details Name: Antonio Navarro MRN: [id number] DOB: 1961/05/19 Today's Date: 11/08/2020    History of Present Illness Pt is a 60 y.o. male admitted 07/22/20 post-cardiac arrest with STEMI; found to be in V fib with resucitation by EMS in the field, approximately 30-min of CPR. ETT 12/4-12/13. MRI 12/7 suggestive of a widespread anoxic injury. S/p PEG tube 12/30. PMH includes tobacco abuse, HTN, chronic back pain.    PT Comments    Pt received up in the chair; agreeable, interactive and laughing with PT. Session focused on continued gait/balance training and cognitive remediation. Pt ambulating hallway distances with a walker at a min guard assist level. Requires consistent cueing for obstacle negotiation and often runs into objects on the right. Presents as a high fall risk based on decreased gait speed and safety awareness. Continue to recommend SNF for ongoing Physical Therapy.      Follow Up Recommendations  SNF;Supervision/Assistance - 24 hour     Equipment Recommendations  Rolling walker with 5" wheels;3in1 (PT);Wheelchair (measurements PT);Wheelchair cushion (measurements PT)    Recommendations for Other Services       Precautions / Restrictions Precautions Precautions: Fall Restrictions Weight Bearing Restrictions: No    Mobility  Bed Mobility               General bed mobility comments: OOB in chair    Transfers Overall transfer level: Needs assistance Equipment used: Rolling walker (2 wheeled) Transfers: Sit to/from Stand Sit to Stand: Min guard         General transfer comment: Min guard to rise to stand from recliner  Ambulation/Gait Ambulation/Gait assistance: Min guard Gait Distance (Feet): 380 Feet Assistive device: Rolling walker (2 wheeled) Gait Pattern/deviations: Step-through pattern;Narrow base of support;Scissoring Gait velocity: decreased   General Gait Details: Pt running into several obstacles on the  right, required verbal cues to correct. Cues also provided for wider BOS, and keeping feet apart during turns. Easily distracted by environmental stimuli. Min guard for balance   Stairs             Wheelchair Mobility    Modified Rankin (Stroke Patients Only)       Balance Overall balance assessment: Needs assistance Sitting-balance support: Feet supported;No upper extremity supported Sitting balance-Leahy Scale: Fair     Standing balance support: Bilateral upper extremity supported;During functional activity Standing balance-Leahy Scale: Poor Standing balance comment: BUE support for mobility this session                            Cognition Arousal/Alertness: Awake/alert Behavior During Therapy: WFL for tasks assessed/performed Overall Cognitive Status: Impaired/Different from baseline Area of Impairment: Attention;Following commands;Safety/judgement;Awareness;Problem solving                   Current Attention Level: Sustained Memory: Decreased short-term memory Following Commands: Follows multi-step commands inconsistently Safety/Judgement: Decreased awareness of safety;Decreased awareness of deficits Awareness: Intellectual Problem Solving: Requires verbal cues General Comments: Pt pleasant, laughing, interactive with therapist. Able to follow 1 step commands, but not consistently. Decreased STM, unable to recognize room or room number despite cues      Exercises      General Comments        Pertinent Vitals/Pain Pain Assessment: Faces Faces Pain Scale: No hurt    Home Living                      Prior  Function            PT Goals (current goals can now be found in the care plan section) Acute Rehab PT Goals Patient Stated Goal: pt fiance would like him to get intensive rehab Potential to Achieve Goals: Fair Progress towards PT goals: Progressing toward goals    Frequency    Min 3X/week      PT Plan Current  plan remains appropriate    Co-evaluation              AM-PAC PT "6 Clicks" Mobility   Outcome Measure  Help needed turning from your back to your side while in a flat bed without using bedrails?: None Help needed moving from lying on your back to sitting on the side of a flat bed without using bedrails?: A Little Help needed moving to and from a bed to a chair (including a wheelchair)?: A Little Help needed standing up from a chair using your arms (e.g., wheelchair or bedside chair)?: A Little Help needed to walk in hospital room?: A Little Help needed climbing 3-5 steps with a railing? : A Lot 6 Click Score: 18    End of Session Equipment Utilized During Treatment: Gait belt Activity Tolerance: Patient tolerated treatment well Patient left: in chair;with call bell/phone within reach;with chair alarm set Nurse Communication: Mobility status PT Visit Diagnosis: Other abnormalities of gait and mobility (R26.89);Difficulty in walking, not elsewhere classified (R26.2)     Time: 4970-2637 PT Time Calculation (min) (ACUTE ONLY): 20 min  Charges:  $Gait Training: 8-22 mins                     Wyona Almas, PT, DPT Acute Rehabilitation Services Pager [phone] Office [phone]    Deno Etienne 11/08/2020, 12:48 PM

## 2020-11-08 NOTE — Procedures (Signed)
PROCEDURE SUMMARY:  Successful removal of gastrostomy tube, removed intact.  No immediate complications.  EBL <1 mL. Patient tolerated well.  Pressure dressing (gauze + tape) applied to site.  Please see imaging section of Epic for full dictation.   Earley Abide PA-C 11/08/2020 3:37 PM

## 2020-11-08 NOTE — Progress Notes (Signed)
OT Cancellation Note  Patient Details Name: Antonio Navarro MRN: [id number] DOB: July 25, 1961   Cancelled Treatment:    Reason Eval/Treat Not Completed: Patient at procedure or test/ unavailable;Other (comment) Pt out of room at IR to have PEG removed, will check back as time allows for OT session.   Corinne Ports K., COTA/L Acute Rehabilitation Services [phone]   Precious Haws 11/08/2020, 3:17 PM

## 2020-11-08 NOTE — Plan of Care (Signed)
  Problem: Activity: Goal: Risk for activity intolerance will decrease Outcome: Progressing   Problem: Neurologic: Goal: Promote progressive neurologic recovery Outcome: Progressing

## 2020-11-08 NOTE — Progress Notes (Signed)
Patient was very active in the night per report, attempted to coax patient to get up for breakfast with nurse tech assisting, or to assist in set up in bed, patient refused, rolling over and covering his head in the blankets.  Will try again later.

## 2020-11-09 NOTE — Progress Notes (Signed)
Occupational Therapy Treatment Patient Details Name: Antonio Navarro MRN: [id number] DOB: 27-Oct-1960 Today's Date: 11/09/2020    History of present illness Pt is a 60 y.o. male admitted 07/22/20 post-cardiac arrest with STEMI; found to be in V fib with resucitation by EMS in the field, approximately 30-min of CPR. ETT 12/4-12/13. MRI 12/7 suggestive of a widespread anoxic injury. S/p PEG tube 12/30. PMH includes tobacco abuse, HTN, chronic back pain.  PEG tube removed.   OT comments  Patient admitted 110 days ago for the above diagnosis.  During this time Antonio Navarro has made progress with his mobility, and certain aspects of his self care.  Unfortunately, OT believes he is most likely at his new cognitive baseline, and it is not expected, he will progress much beyond his current level for bathing, dressing, toileting and self feeding.  Over the past treatments, he has shown inconsistency in physical performance of ADL mainly due to continued balance deficits, and cognitive deficits.  Antonio Navarro will need 24 hour minimal assist, supervision, and cueing for most, if not all, functional tasks.  He needs cues to initiate tasks, and cues to sequence through tasks to complete them, and this is not anticipated to improve.  This OT had conversations with NT and RN that are familiar with Antonio Navarro.  We discussed trying to establish a routine for [name]: consistent awake times, times in the chair, rest times, ADL times, and a toileting schedule.  Both NT and RN verbalized they try to accomplish this, but as described above, inconsistencies with behavior, cooperation and cognitive capacity are the barriers.  OT continues to recommend 24 hour assist as needed, SNF is an option, but home with family and a toileting plan, could be considered if 24/7 is possible.  Of note: patient's diet was downgraded to full liquid 3/23 due to pocketing of solids, whereas earlier in the week he ate meat and potatoes with utensils and supervision  without assist.  Please re-consult OT if definitive, documented improvements in cognition impacting ADL are noted.  Antonio Navarro would benefit from a care planned routine that is followed consistently.      Follow Up Recommendations  SNF;Supervision/Assistance - 24 hour    Equipment Recommendations  None recommended by OT    Recommendations for Other Services      Precautions / Restrictions Precautions Precautions: Fall Restrictions Weight Bearing Restrictions: No       Mobility Bed Mobility Overal bed mobility: Needs Assistance             General bed mobility comments: up in recliner    Transfers Overall transfer level: Needs assistance Equipment used: Rolling walker (2 wheeled) Transfers: Sit to/from Stand Sit to Stand: Min guard         General transfer comment: Min A for in room and hallway mobility.    Balance Overall balance assessment: Needs assistance Sitting-balance support: Feet supported;No upper extremity supported Sitting balance-Leahy Scale: Good     Standing balance support: Bilateral upper extremity supported;During functional activity Standing balance-Leahy Scale: Poor Standing balance comment: BUE support for mobility this session                           ADL either performed or assessed with clinical judgement   ADL Overall ADL's : Needs assistance/impaired Eating/Feeding: Supervision/ safety;Set up;Minimal assistance Eating/Feeding Details (indicate cue type and reason): inconsistent - some meals he is able to feed himself with supervision, other times he is fed. Grooming:  Oral care;Sitting;Cueing for sequencing;Cueing for compensatory techniques;Standing   Upper Body Bathing: Sitting;Standing;Cueing for safety;Cueing for sequencing;Cueing for compensatory techniques   Lower Body Bathing: Sit to/from stand;Cueing for sequencing;Cueing for compensatory techniques   Upper Body Dressing : Minimal assistance;Sitting;Cueing for  sequencing;Cueing for compensatory techniques   Lower Body Dressing: Minimal assistance;Sit to/from stand;Cueing for compensatory techniques;Cueing for sequencing   Toilet Transfer: Minimal assistance;RW;Ambulation           Functional mobility during ADLs: Minimal assistance;Cueing for safety;Rolling walker General ADL Comments: patient trialed without RW for in room mobility - patient will need external support.  Patient exhibits short choppy steps, poor spacial awareness and balance deficits.                       Cognition Arousal/Alertness: Awake/alert Behavior During Therapy: WFL for tasks assessed/performed Overall Cognitive Status: Impaired/Different from baseline                 Rancho Levels of Cognitive Functioning [place] [employer] Scales of Cognitive Functioning: Confused/appropriate               General Comments: patient exhibits inconsistent level of support, assist, cueing depending on the day and time of session.  OT believes he is most likely at his new cognitive baseline.  He has shown more positive emotion as of late.                          Pertinent Vitals/ Pain       Pain Assessment: No/denies pain Faces Pain Scale: No hurt Pain Intervention(s): Monitored during session                                                          Frequency    discharge       Progress Toward Goals  OT Goals(current goals can now be found in the care plan section)  Progress towards OT goals: Not progressing toward goals - comment (OT discontinuing)  Acute Rehab OT Goals OT Goal Formulation: With patient/family Potential to Achieve Goals: [place] Discharge plan needs to be updated;Frequency needs to be updated    Co-evaluation                 AM-PAC OT "6 Clicks" Daily Activity     Outcome Measure   Help from another person eating meals?: A Little Help from another person taking care of personal  grooming?: A Little Help from another person toileting, which includes using toliet, bedpan, or urinal?: A Little Help from another person bathing (including washing, rinsing, drying)?: A Lot Help from another person to put on and taking off regular upper body clothing?: A Little Help from another person to put on and taking off regular lower body clothing?: A Lot 6 Click Score: 16    End of Session Equipment Utilized During Treatment: Gait belt;Rolling walker  OT Visit Diagnosis: Other symptoms and signs involving cognitive function   Activity Tolerance Patient tolerated treatment well   Patient Left in chair;with call bell/phone within reach;with chair alarm set;with family/visitor present   Nurse Communication Other (comment) (OT discharge)        Time: 2536-6440 OT Time Calculation (min): 25 min  Charges: OT General Charges $OT Visit:  1 Visit OT Treatments $Self Care/Home Management : 23-37 mins  11/09/2020  Rich, OTR/L  Acute Rehabilitation Services  Office:  [place] 11/09/2020, 1:01 PM

## 2020-11-09 NOTE — Progress Notes (Signed)
[facility] PROGRESS NOTE  TEJ MURDAUGH ASN:[id number] DOB: November 24, 1960 DOA: 07/22/2020 PCP: Patient, No Pcp Per       Status: Remains inpatient appropriate because:Altered mental status and Unsafe d/c plan   Dispo: The patient is from: Home              Anticipated d/c is to: SNF              Anticipated d/c date is: > 3 days              Patient currently is medically stable to d/c.  Barriers to discharge: Medicaid and disability pending. No SNF bed offers.  No further nocturnal agitation or wandering behavior so hopeful will be eligible for SNF bed offer.  Patient able to ambulate 300 feet so hopefully he can eventually discharge home with family as long as they can provide appropriate 24/7 monitoring.  On 2/28 TOC contacted at [facility]   Code Status: Full Family Communication: 3/22 updated Sister [name] by telephone DVT prophylaxis: sq heparin Vaccination status: Completed both doses of Pfizer Covid vaccination on 12/14/2019; [place] booster ordered to be given on 3/3   Foley catheter: No  HPI: 60 year old male history of hypertension prescribed Norvasc prior to admission, chronic back pain, prior renal calculi who experienced an out of hospital cardiac arrest with downtime of 9 minutes.  Initial rhythm was ventricular fibrillation.  He was defibrillated deceived 6 doses of epi before ROSC.  Subsequent work-up revealed STEMI.  He underwent cardiac catheterization with a DES placed to the circumflex.  Cardiology following and has placed patient on Brilinta and aspirin and is also being treated with beta-blocker and statins.  From a neurological standpoint he clinically has anoxic brain injury.  During this hospitalization he experienced AKI and was treated with fluids and correction of hypoperfusion from cardiac arrest.  Echo performed this admission revealed ischemic cardiomyopathy with an EF of 40%  In addition to above patient has been unable to pass  swallowing evaluations and subsequently has undergone percutaneous PEG tube placement on 12/30.  Patient is awake and moving all extremities spontaneously but does not follow commands and does not verbalize palliative care has met with patient and family and has emphasized poor prognosis and will continue to meet with the family during the hospitalization.  Current plan is to transition to SNF bed once available.  Expect need for lifelong 24/7 care for all ADLs.  Subjective: Patient sleepy this morning but did awaken during exam and smiled.  Unfortunately since PEG tube has been removed patient is now not eating consistently since tube removed and has refused medications overnight.  SLP asked to reevaluate the patient in the event he could upgrade to D 3/mechanical soft diet since he has repeatedly stated he does not like the texture of the foods on his tray.  Evaluation was completed and patient was essentially not very cooperative and was holding food in his mouth.  Because of this abnormal exam diet was decreased to full liquids with speech continuing to follow.  Objective: Vitals:   11/08/20 1946 11/09/20 0452  BP: 114/80 129/82  Pulse: 81 74  Resp: 20 18  Temp: 98.3 F (36.8 C) 98.1 F (36.7 C)  SpO2: 98% 100%    Intake/Output Summary (Last 24 hours) at 11/09/2020 0747 Last data filed at 11/09/2020 0400 Gross per 24 hour  Intake 720 ml  Output 600 ml  Net 120 ml   Filed Weights   11/07/20  5361 11/08/20 0016 11/09/20 0559  Weight: 84.6 kg 86.6 kg 85.5 kg    Exam: Constitutional: Calm, no acute distress Heart: Normal heart sounds, no peripheral edema, no obvious JVD Respiratory: Lungs are clear and he remained stable on room air Abdomen: LBM 3/19, PEG tube has been removed, ordered add D2 diet but over the past 24 hours has been inconsistent with intake Neurologic: CN 2-12 intact without any focal neurological deficits.  Ambulates without difficulty Psychiatric: Sleeping but  awakened.  Smiled appropriately.  Oriented times name.  Attempts to verbally engage but speech is limited due to expressive aphasia and dysarthria. Skin: Previous areas of flat white lesions have resolved   Assessment/Plan: Acute problems: Out of hospital cardiac arrest secondary to STEMI -ROSC and subsequent cardiac catheterization revealed dz in circumflex requiring DES -Continue beta-blocker, statin, Plavix, isosorbide and hydralazine  Significant anoxic brain injury secondary to cardiac arrest/deconditioning/gait instability -Consistently participating with PT and OT.  SLP has cleared for dysphagia 2 with thin liquids -Declined by CIR since family unable to provide 24/7 care.  Plan is for SNF -Continue Seroquel 100 mg at at bedtime -2/18 add Seroquel 25 mg every 12 hours as needed for agitation and wandering -Continue daytime Symmetrel  -Patient is a "night owl" and has difficulty sleeping at night.  At times he is very bored and walks the halls.  Because he has been up all night/most of the night he prefers to sleep in therefore after discussion with the nurse we have opted to change his a.m. dosing of all meds 10 AM and they can be given later if necessary. **PT NOTES FROM 3/23**  Pt received up in the chair; agreeable, interactive and laughing with PT. Session focused on continued gait/balance training and cognitive remediation. Pt ambulating hallway distances with a walker at a min guard assist level. Requires consistent cueing for obstacle negotiation and often runs into objects on the right. Presents as a high fall risk based on decreased gait speed and safety awareness. Continue to recommend SNF for ongoing Physical Therapy.     SLP notes 3/22:   Pt was seen primarily for cognitive-linguistic tx, taking a single bite of puree off his breakfast tray but declining the rest of it. Tx focused on receptive identification and reading at the word level, with pt matching single words to  objects on his tray. Pt needed distractions reduced in his environment to provide a field of two, and matched each word to the correct item when given extra time. When pt was given two words at a time he had increased difficulty deciphering which word to put with each item, needing Mod cues from SLP. Only Min cues were provided for accuracy with reading. Pt will benefit from ongoing therapy to address cognitive-linguistic and swallowing goals.    OT notes 3/23:  Pt making steady progress towards OT goals this session. Session focus on following commands during cognitive tasks such as engaging in tic-tac-toe game, handwriting and completing table top search and find activity ( see cog section). Pt able to follow one steps and 2 steps command with increased time and step by step cues to problem solve through sequencing all tasks. Pts fiance reports that her goal for the pt is for him to be able to verbalize toileting needs. Will follow acutely for OT needs.     White lesions/rash on back and buttocks -Appeared after patient had laying in tube feeding    10/10/20  10/10/20     10/16/2020     10/20/2020                                     10/20/2020 -Rash has resolved  Situational depression -Started Celexa 2/16-increased to 20 mg 2/18  Ischemic cardiomyopathy -Echo this admission EF 40% -Has redeveloped suboptimal blood pressure readings therefore will decrease hydralazine to 25 TID and increase Isordil to 10 TID -Decrease carvedilol to 12.5 twice daily -No ACE inhibitor secondary to underlying kidney disease -Follow-up echocardiogram done March 2022 demonstrates an EF of 40 to 45% with LVH and grade 1 diastolic dysfunction physiology  Nutrition Status: Dysphagia with moderate protein calorie malnutrition Nutrition Problem: Increased nutrient needs Etiology: chronic illness (CKD3b) Signs/Symptoms: estimated needs Interventions: Ensure Enlive (each supplement provides  350kcal and 20 grams of protein),MVI,Magic cup,Prostat,Refer to RD note for recommendations PEG tube discontinued on 3/23 after patient demonstrated several weeks of consistent intake of oral diet and medications 3/24 unfortunately since PEG tube has been discontinued patient is now inconsistently eating and refusing medications overnight and this morning 3/24 follow-up swallowing evaluation per SLP: Patient appears to not be cooperative with exam and was holding liquids in his mouth.  Because of this finding SLP had no choice but to decrease diet back to full liquids but will continue to follow. Continue Ensure protein beverages twice daily between meals; Prosource discontinued since this medication needs to be given via tube given disgusting taste Estimated body mass index is 25.56 kg/m as calculated from the following:   Height as of this encounter: 6' (1.829 m).   Weight as of this encounter: 85.5 kg.  Hypertension -Was on Norvasc prior to admission -Continue cardiovascular meds as listed above  Acute on chronic kidney disease stage IIIb -Suspect prerenal etiology in the setting of hypoperfusion from ventricular fibrillation arrest -Baseline creatinine between 1.6 and 2-creatinine as of 3/14 stable at 1.33 -Continue free water per tube and intermittently follow labs   Other problems: Acute hypernatremia -Continue free water per tube  E. coli UTI -Has completed 5 days of Bactrim  Hyperglycemia without an underlying diagnosis of diabetes mellitus Resolved -Likely related to acute stress -Hemoglobin A1c 4.7   Acute hypoxemic respiratory failure -Resolved -Stable on room air with O2 sats between 97 and 99%  Nonsustained VT -Continue amiodarone and beta-blocker -1/31 LFTs normal  Skin tear right lateral back Wound / Incision (Open or Dehisced) 07/28/20 Skin tear Back Lateral;Right;Upper (Active)  Date First Assessed/Time First Assessed: 07/28/20 0300   Wound Type: Skin tear   Location: Back  Location Orientation: Lateral;Right;Upper  Present on Admission: No    Assessments 07/28/2020  7:49 PM 11/08/2020  9:00 AM  Dressing Type None None  Drainage Amount None --  Treatment Cleansed --     No Linked orders to display    Data Reviewed: Basic Metabolic Panel: No results for input(s): NA, K, CL, CO2, GLUCOSE, BUN, CREATININE, CALCIUM, MG, PHOS in the last 168 hours. Liver Function Tests: No results for input(s): AST, ALT, ALKPHOS, BILITOT, PROT, ALBUMIN in the last 168 hours. No results for input(s): LIPASE, AMYLASE in the last 168 hours. No results for input(s): AMMONIA in the last 168 hours. CBC: No results for input(s): WBC, NEUTROABS, HGB, HCT, MCV, PLT in the last 168 hours. Cardiac Enzymes: No results for input(s): CKTOTAL, CKMB, CKMBINDEX, TROPONINI in the last 168 hours. BNP (last 3 results) No  results for input(s): BNP in the last 8760 hours.  ProBNP (last 3 results) No results for input(s): PROBNP in the last 8760 hours.  CBG: Recent Labs  Lab 11/07/20 0605 11/07/20 1217 11/08/20 0013 11/08/20 0607 11/08/20 1204  GLUCAP 116* 133* 116* 117* 124*    No results found for this or any previous visit (from the past 240 hour(s)).   Studies: IR GASTROSTOMY TUBE REMOVAL/REPAIR  Result Date: 11/08/2020 INDICATION: Patient history of dysphagia protein calorie malnutrition s/p gastrostomy tube placement in IR 08/17/2020. Patient now tolerating p.o. intake. Request is made for removal of gastrostomy tube. EXAM: BEDSIDE REMOVAL OF GASTROSTOMY TUBE COMPARISON:  None. MEDICATIONS: 6 mL 1% viscous lidocaine CONTRAST:  None FLUOROSCOPY TIME:  None COMPLICATIONS: None immediate. PROCEDURE: A time-out was performed prior to the initiation of the procedure. The track of the existing gastrostomy tube was lubricated with viscous lidocaine. Using manual traction, the existing pull-through gastrostomy tube was removed intact. A dressing was placed. The patient  tolerated the procedure well without immediate postprocedural complication. IMPRESSION: Successful bedside removal of pull-through gastrostomy tube without complication Read by: Earley Abide, PA-C Electronically Signed   By: Aletta Edouard M.D.   On: 11/08/2020 15:40    Scheduled Meds: . amantadine  100 mg Oral Daily  . aspirin  81 mg Oral Daily  . atorvastatin  80 mg Oral Daily  . carvedilol  12.5 mg Oral BID WC  . chlorhexidine gluconate (MEDLINE KIT)  15 mL Mouth Rinse BID  . citalopram  20 mg Oral Daily  . clopidogrel  75 mg Oral Daily  . enoxaparin (LOVENOX) injection  40 mg Subcutaneous Q24H  . feeding supplement  237 mL Oral BID BM  . hydrALAZINE  25 mg Oral Q8H  . isosorbide dinitrate  10 mg Oral TID  . mouth rinse  15 mL Mouth Rinse BID  . multivitamin with minerals  1 tablet Oral Daily  . polyvinyl alcohol  1 drop Both Eyes QID  . QUEtiapine  25 mg Oral Daily  . sodium chloride flush  3 mL Intravenous Q12H   Continuous Infusions:   Active Problems:   Acute ST elevation myocardial infarction (STEMI) (HCC)   Cardiac arrest (HCC)   Elevated blood pressure reading with diagnosis of hypertension   Shock circulatory (HCC)   Encephalopathy   History of ETT   Cardiomyopathy, ischemic   Anoxic brain damage ([place])   Dysphagia   DNR (do not resuscitate) discussion   Gait instability   E. coli UTI   Essential hypertension   Consultants:  Cardiology  Interventional radiology  PCCM  Neurology  Procedures:  Cardiac catheterization 12/4  Continuous EEG 12/4 and overnight EEG with video 12/5  Echocardiogram 12/5  EEG 12/9  Percutaneous PEG tube placement 12/30  Antibiotics: Anti-infectives (From admission, onward)   Start     Dose/Rate Route Frequency Ordered Stop   10/14/20 1000  fluconazole (DIFLUCAN) tablet 100 mg        100 mg Oral Daily 10/14/20 0848 10/18/20 0932   09/13/20 1000  sulfamethoxazole-trimethoprim (BACTRIM) 200-40 MG/5ML suspension 20 mL         20 mL Per Tube Every 12 hours 09/13/20 0734 09/19/20 0926   09/13/20 0745  cefTRIAXone (ROCEPHIN) 1 g in sodium chloride 0.9 % 100 mL IVPB  Status:  Discontinued        1 g 200 mL/hr over 30 Minutes Intravenous Every 24 hours 09/13/20 0658 09/13/20 0734   08/17/20 0000  ceFAZolin (ANCEF) IVPB  2g/100 mL premix        2 g 200 mL/hr over 30 Minutes Intravenous To Radiology 08/15/20 1346 08/17/20 1111   07/22/20 0830  cefTRIAXone (ROCEPHIN) 2 g in sodium chloride 0.9 % 100 mL IVPB        2 g 200 mL/hr over 30 Minutes Intravenous Every 24 hours 07/22/20 0720 07/26/20 0858       Time spent: 20 minutes    Erin Hearing ANP  [facility] 7 am - 330 pm/M-F for direct patient care and secure chat Please refer to Amion for contact information 110 days

## 2020-11-09 NOTE — Progress Notes (Signed)
946: Pt refusing medication and breakfast at this time. MD aware. Meds held for now will reattempt later.   1145: reattempted to give medication prior to working with OT; Pt agreed to take some medications but not all. MAR updated. Will continue to monitor.

## 2020-11-09 NOTE — TOC Progression Note (Signed)
Transition of Care [facility]) - Progression Note    Patient Details  Name: Antonio Navarro MRN: [id number] Date of Birth: 07-Aug-1961  Transition of Care [facility]) CM/SW Contact  Curlene Labrum, RN Phone Number: 11/09/2020, 11:34 AM  Clinical Narrative:    Case management met with the patient at the bedside regarding transitions of care.  The patient's PEG was removed yesterday.  The patient is currently taking decreased oral intake for diet and was unwilling/ unable to take his medications this morning per the bedside nurse. ST met with the patient for evaluation and Erin Hearing, NP is aware.  The patient currently has no bed offers for SNF and TOC team will continue to follow the patient for possible bed offers at SNF facilities.  The patient's medicaid application is pending and continues to be a barrier in finding SNF placement with acceptance of LOG.  TOC leadership is aware and CM and MSW will continue to follow the patient for possible bed offers for care since the patient is unable to provide 24 hour supervision at home and is not a safe discharge plan.  The patient's family is aware.   Expected Discharge Plan: Skilled Nursing Facility Barriers to Discharge: Continued Medical Work up,No SNF bed  Expected Discharge Plan and Services Expected Discharge Plan: [place] arrangements for the past 2 months: Single Family Home                                       Social Determinants of Health (SDOH) Interventions    Readmission Risk Interventions No flowsheet data found.

## 2020-11-09 NOTE — Progress Notes (Signed)
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  Ongoing  GOAL:   Patient will meet greater than or equal to 90% of their needs  Progressing   MONITOR:   PO intake,Supplement acceptance,Skin,I & O's,Labs,Weight trends  REASON FOR ASSESSMENT:   Consult,Ventilator Enteral/tube feeding initiation and management  ASSESSMENT:   Patient with PMH significant for HTN and kidney stones. Presents this admission s/p cardiac arrest. Recent onset of CKD3b, dysphagia.  12/13- NGT d/c 12/15- cortrak placed, tip of tube in the stomach 12/17- s/p BSE- recommend continue NPO 12/19- pt pulled cortrak tube, replaced- placement verified by x-ray (stomach) 12/20- s/p BSE- pt refusing PO trials 12/30- cortrak removed, PEG placed 1/5- Advanced to thin liquid diet 1/6- Advanced to full liquid diet 1/11- Advanced to dysphagia 2 diet 1/14- transitioned to nocturnal feedings by MD 1/18- TF d/c by MD 1/24- calorie count completed- pt consuming about 50% of needs PO; nocturnal feedings resumed 3/10- TF d/c  3/23- g-tube removed 3/24- diet downgraded to full liquids per SLP  Reviewed I/O's: +120 ml x 24 hours and +2.6 L since 10/26/20  UOP: 600 ml x 24 hours  Spoke with pt at bedside. He did not respond to most questions but smiled and laughed when this RD greeted him. Per RN notes, pt refused breakfast and medications this morning.   Prior to g-tube removal yesterday, pt with good appetite, consuming 50-100% of meals and was drinking Ensure Enlive supplements. Prosource supplements d/c by MD  due to poor acceptance.  Wt has been stable over the past month.   Per TOC notes, pt continues to await SNF placement.   Labs reviewed: CBGS: 116-133.  Diet Order:   Diet Order            Diet full liquid Room service appropriate? No; Fluid consistency: Thin  Diet effective now                 EDUCATION NEEDS:   No education needs have been identified at this time  Skin:  Skin Assessment: Skin Integrity Issues: Skin Integrity Issues:: Other (Comment) Other: Skin tear R upper back, L buttocks rash  Last BM:  11/08/20  Height:   Ht Readings from Last 1 Encounters:  09/03/20 6' (1.829 m)    Weight:   Wt Readings from Last 1 Encounters:  11/09/20 85.5 kg    Ideal Body Weight:  80.9 kg  BMI:  Body mass index is 25.56 kg/m.  Estimated Nutritional Needs:   Kcal:  2300-2500 kcal  Protein:  115-130 grams  Fluid:  >/= 2 L/day    Loistine Chance, RD, LDN, [place] Registered Dietitian II Certified Diabetes Care and Education Specialist Please refer to [facility] for RD and/or RD on-call/weekend/after hours pager

## 2020-11-09 NOTE — Plan of Care (Signed)
  Problem: Activity: Goal: Risk for activity intolerance will decrease Outcome: Progressing   Problem: Safety: Goal: Ability to remain free from injury will improve Outcome: Progressing   

## 2020-11-09 NOTE — Progress Notes (Signed)
  Speech Language Pathology Treatment: Dysphagia;Cognitive-Linquistic  Patient Details Name: Antonio Navarro MRN: [id number] DOB: [birth year] Today's Date: 11/09/2020 Time: 0940-1000 SLP Time Calculation (min) (ACUTE ONLY): 20 min  Assessment / Plan / Recommendation Clinical Impression  Pt seen at bedside for skilled ST intervention targeting goals for cognitive/communication and swallowing. RN consulted SLP, requesting reassessment. RN reports Pt's PEG tube was removed yesterday, and pt is currently refusing all PO intake and meds. Pt was awake, seated upright in bed with RN present. He was nonvocal, and did not follow commands. Pt was noted to nod in response to yes/no questions. RN provided pt a small piece of pear from breakfast tray, which resulted in no oral manipulation, and no swallow. Pt did accept small boluses of liquid via straw, but held this as well. SLP set up suction to facilitate oral care and clearing, and cleaned pt oral cavity to remove PO trials. At this time, downgraded diet is recommended. Will change diet to full liquid to minimizing choking hazard of solid textures. Safe swallow precautions were updated at [facility], RN and MD informed. SLP will continue to follow to assess pt willingness to accept PO and appropriate diet advancement, as well as cognitive/communication treatments.   HPI HPI: Pt is a 60 yo male s/p arrest with downtime 9 minutes. Initial rhythm was V. fib, shocked and received 6 rounds of epi before ROSC achieved.  Total CPR time was 30 minutes. ETT 12/4-12/13. MRI 12/7 suggestive of a widespread anoxic injury. PMH includes kidney stones and HTN.      SLP Plan  Goals updated       Recommendations  Diet recommendations: Other(comment) (return to full liquids) Liquids provided via: Cup;Straw Medication Administration: Crushed with puree Supervision: Staff to assist with self feeding;Full supervision/cueing for compensatory strategies Compensations: Slow  rate;Small sips/bites;Minimize environmental distractions (use suction to insure oral clearing - pt now holding PO trials orally) Postural Changes and/or Swallow Maneuvers: Seated upright 90 degrees                Oral Care Recommendations: Oral care QID;Staff/trained caregiver to provide oral care Follow up Recommendations: Skilled Nursing facility SLP Visit Diagnosis: Cognitive communication deficit (R41.841);Dysphagia, unspecified (R13.10) Plan: Goals updated       GO               Celia B. Quentin Ore, [facility], [place] Speech Language Pathologist Office: [phone] Pager: [phone]  Shonna Chock 11/09/2020, 10:03 AM

## 2020-11-10 DIAGNOSIS — F4321 Adjustment disorder with depressed mood: Secondary | ICD-10-CM

## 2020-11-10 MED ORDER — CLOPIDOGREL BISULFATE 75 MG PO TABS
75.0000 mg | ORAL_TABLET | Freq: Every day | ORAL | Status: DC
  Administered 2020-11-11 – 2020-11-24 (×14): 75 mg via ORAL
  Filled 2020-11-10 (×15): qty 1

## 2020-11-10 MED ORDER — QUETIAPINE FUMARATE 25 MG PO TABS
25.0000 mg | ORAL_TABLET | Freq: Every day | ORAL | Status: DC
  Administered 2020-11-10 – 2020-11-23 (×13): 25 mg via ORAL
  Filled 2020-11-10 (×15): qty 1

## 2020-11-10 MED ORDER — ISOSORBIDE MONONITRATE ER 30 MG PO TB24
30.0000 mg | ORAL_TABLET | Freq: Every day | ORAL | Status: DC
  Administered 2020-11-10 – 2020-11-24 (×14): 30 mg via ORAL
  Filled 2020-11-10 (×15): qty 1

## 2020-11-10 MED ORDER — METOPROLOL SUCCINATE ER 25 MG PO TB24
12.5000 mg | ORAL_TABLET | Freq: Every day | ORAL | Status: DC
  Administered 2020-11-10 – 2020-11-24 (×14): 12.5 mg via ORAL
  Filled 2020-11-10 (×16): qty 1

## 2020-11-10 MED ORDER — ATORVASTATIN CALCIUM 80 MG PO TABS
80.0000 mg | ORAL_TABLET | Freq: Every day | ORAL | Status: DC
  Administered 2020-11-11 – 2020-11-24 (×14): 80 mg via ORAL
  Filled 2020-11-10 (×13): qty 1
  Filled 2020-11-10: qty 2

## 2020-11-10 MED ORDER — DOCUSATE SODIUM 100 MG PO CAPS: 100.0000 mg | ORAL_CAPSULE | Freq: Two times a day (BID) | ORAL | Status: DC | PRN

## 2020-11-10 MED ORDER — ASPIRIN 81 MG PO CHEW
81.0000 mg | CHEWABLE_TABLET | Freq: Every day | ORAL | Status: DC
  Administered 2020-11-11 – 2020-11-24 (×14): 81 mg via ORAL
  Filled 2020-11-10 (×14): qty 1

## 2020-11-10 MED ORDER — CITALOPRAM HYDROBROMIDE 20 MG PO TABS: 20.0000 mg | ORAL_TABLET | Freq: Every day | ORAL | Status: DC

## 2020-11-10 MED ORDER — CITALOPRAM HYDROBROMIDE 20 MG PO TABS
40.0000 mg | ORAL_TABLET | Freq: Every day | ORAL | Status: DC
  Administered 2020-11-11 – 2020-11-24 (×14): 40 mg via ORAL
  Filled 2020-11-10 (×15): qty 2

## 2020-11-10 NOTE — Plan of Care (Signed)
?  Problem: Coping: ?Goal: Level of anxiety will decrease ?Outcome: Progressing ?  ?Problem: Safety: ?Goal: Ability to remain free from injury will improve ?Outcome: Progressing ?  ?

## 2020-11-10 NOTE — Progress Notes (Signed)
Physical Therapy Treatment Patient Details Name: Antonio Navarro MRN: [id number] DOB: 1961/01/19 Today's Date: 11/10/2020    History of Present Illness Pt is a 60 y.o. male admitted 07/22/20 post-cardiac arrest with STEMI; found to be in V fib with resucitation by EMS in the field, approximately 30-min of CPR. ETT 12/4-12/13. MRI 12/7 suggestive of a widespread anoxic injury. S/p PEG tube 12/30. PMH includes tobacco abuse, HTN, chronic back pain.  PEG tube removed.    PT Comments    Pt tolerates treatment well, initially resistant to sitting at edge of bed but becoming more participatory with encouragement from fiance. Pt demonstrates increased tone in LEs with bed mobility and has difficulty coordinating UEs for fine motor tasks, ie donning mask. Pt will benefit from continued acute PT services to improve dynamic balance and to reduce falls risk. PT continues to recommend SNF placement.   Follow Up Recommendations  SNF;Supervision/Assistance - 24 hour     Equipment Recommendations  Rolling walker with 5" wheels;3in1 (PT);Wheelchair (measurements PT);Wheelchair cushion (measurements PT)    Recommendations for Other Services       Precautions / Restrictions Precautions Precautions: Fall Precaution Comments: g tube Restrictions Weight Bearing Restrictions: No    Mobility  Bed Mobility Overal bed mobility: Needs Assistance Bed Mobility: Rolling;Sidelying to Sit;Sit to Supine Rolling: Min assist Sidelying to sit: Mod assist   Sit to supine: Supervision   General bed mobility comments: pt initially resistant to bed mobility and with increased adductor tone in BLE    Transfers Overall transfer level: Needs assistance Equipment used: Rolling walker (2 wheeled) Transfers: Sit to/from Stand Sit to Stand: Min assist            Ambulation/Gait Ambulation/Gait assistance: Min guard Gait Distance (Feet): 250 Feet Assistive device: Rolling walker (2 wheeled) Gait  Pattern/deviations: Step-to pattern;Step-through pattern Gait velocity: reduced Gait velocity interpretation: <1.8 ft/sec, indicate of risk for recurrent falls General Gait Details: pt with slowed step-through gait, PT provides encouragement for increased step length which pt intermittently follows.   Stairs             Wheelchair Mobility    Modified Rankin (Stroke Patients Only)       Balance Overall balance assessment: Needs assistance Sitting-balance support: No upper extremity supported;Feet supported Sitting balance-Leahy Scale: Good     Standing balance support: Bilateral upper extremity supported Standing balance-Leahy Scale: Poor Standing balance comment: BUE support for mobility this session                            Cognition Arousal/Alertness: Awake/alert Behavior During Therapy: Impulsive Overall Cognitive Status: Impaired/Different from baseline Area of Impairment: Attention;Following commands;Safety/judgement;Awareness;Problem solving               Rancho Levels of Cognitive Functioning Rancho Los Amigos Scales of Cognitive Functioning: Confused/appropriate   Current Attention Level: Sustained   Following Commands: Follows one step commands with increased time Safety/Judgement: Decreased awareness of safety;Decreased awareness of deficits Awareness: Intellectual Problem Solving: Slow processing;Difficulty sequencing;Requires verbal cues;Requires tactile cues        Exercises      General Comments General comments (skin integrity, edema, etc.): VSS on RA, fiance present and supportive      Pertinent Vitals/Pain Pain Assessment: Faces Faces Pain Scale: No hurt    Home Living                      Prior Function  PT Goals (current goals can now be found in the care plan section) Acute Rehab PT Goals Patient Stated Goal: pt fiance would like him to get intensive rehab Progress towards PT goals:  Progressing toward goals    Frequency    Min 3X/week      PT Plan Current plan remains appropriate    Co-evaluation              AM-PAC PT "6 Clicks" Mobility   Outcome Measure  Help needed turning from your back to your side while in a flat bed without using bedrails?: A Little Help needed moving from lying on your back to sitting on the side of a flat bed without using bedrails?: A Little Help needed moving to and from a bed to a chair (including a wheelchair)?: A Little Help needed standing up from a chair using your arms (e.g., wheelchair or bedside chair)?: A Little Help needed to walk in hospital room?: A Little Help needed climbing 3-5 steps with a railing? : A Lot 6 Click Score: 17    End of Session Equipment Utilized During Treatment: Gait belt Activity Tolerance: Patient tolerated treatment well Patient left: with call bell/phone within reach;in bed;with bed alarm set;with family/visitor present Nurse Communication: Mobility status PT Visit Diagnosis: Other abnormalities of gait and mobility (R26.89);Difficulty in walking, not elsewhere classified (R26.2)     Time: 1540-0867 PT Time Calculation (min) (ACUTE ONLY): 19 min  Charges:  $Gait Training: 8-22 mins                     Zenaida Niece, PT, DPT Acute Rehabilitation Pager: [phone]    Zenaida Niece 11/10/2020, 2:52 PM

## 2020-11-10 NOTE — Progress Notes (Signed)
  Speech Language Pathology Treatment: Dysphagia;Cognitive-Linquistic  Patient Details Name: Antonio Navarro MRN: [id number] DOB: 1961-06-05 Today's Date: 11/10/2020 Time: 7893-8101 SLP Time Calculation (min) (ACUTE ONLY): 25 min  Assessment / Plan / Recommendation Clinical Impression  He was seen with thin liquids and upgraded trials of solids. Pt consumed almost a whole sandwich with no oral signs of dysphagia, which is a significant improvement compared to previous sessions. No overt s/s of aspiration were noted during PO trials as well. Given his progress today, recommend an upgraded diet to DYS 3 solids and thin liquids with full supervision to check for oral holding and to initiate PO intake. Discussed this diet recommendation with his RN and Maudie Mercury (his fiance) who both agreed with this upgrade and to supervision during mealtimes. Provided education on chopping his foods up more if any difficulty presents in the interim before he is seen again.   Pt with spontaneous social greeting upon entering the room but with limited verbalizations beyond this without supoprt from the SLP. Pt expressed his wants/needs at the word level with cueing for repetition. SLP will continue to follow to assess his oropharyngeal swallow with upgraded solids and for cog-ling tx.    HPI HPI: Pt is a 60 yo male s/p arrest with downtime 9 minutes. Initial rhythm was V. fib, shocked and received 6 rounds of epi before ROSC achieved.  Total CPR time was 30 minutes. ETT 12/4-12/13. MRI 12/7 suggestive of a widespread anoxic injury. PMH includes kidney stones and HTN.      SLP Plan  Continue with current plan of care       Recommendations  Diet recommendations: Dysphagia 3 (mechanical soft);Thin liquid Liquids provided via: Cup;Straw Medication Administration: Crushed with puree Supervision: Staff to assist with self feeding;Full supervision/cueing for compensatory strategies Compensations: Slow rate;Small  sips/bites;Minimize environmental distractions Postural Changes and/or Swallow Maneuvers: Seated upright 90 degrees                Oral Care Recommendations: Oral care BID Follow up Recommendations: Skilled Nursing facility SLP Visit Diagnosis: Dysphagia, unspecified (R13.10) Plan: Continue with current plan of care       GO                Jeanine Luz., SLP Student 11/10/2020, 2:17 PM

## 2020-11-10 NOTE — Progress Notes (Signed)
[facility] PROGRESS NOTE  Antonio Navarro ZOX:[id number] DOB: Sep 23, 1960 DOA: 07/22/2020 PCP: Patient, No Pcp Per       Status: Remains inpatient appropriate because:Altered mental status and Unsafe d/c plan   Dispo: The patient is from: Home              Anticipated d/c is to: SNF              Anticipated d/c date is: > 3 days              Patient currently is medically stable to d/c.  Barriers to discharge: Medicaid and disability pending. No SNF bed offers.  No further nocturnal agitation or wandering behavior so hopeful will be eligible for SNF bed offer.  Patient able to ambulate 300 feet so hopefully he can eventually discharge home with family as long as they can provide appropriate 24/7 monitoring.  On 2/28 TOC contacted at [facility]   Code Status: Full Family Communication: 3/22 updated Sister [name] by telephone DVT prophylaxis: sq heparin Vaccination status: Completed both doses of Pfizer Covid vaccination on 12/14/2019; [place] booster ordered to be given on 3/3   Foley catheter: No  HPI: 60 year old male history of hypertension prescribed Norvasc prior to admission, chronic back pain, prior renal calculi who experienced an out of hospital cardiac arrest with downtime of 9 minutes.  Initial rhythm was ventricular fibrillation.  He was defibrillated deceived 6 doses of epi before ROSC.  Subsequent work-up revealed STEMI.  He underwent cardiac catheterization with a DES placed to the circumflex.  Cardiology following and has placed patient on Brilinta and aspirin and is also being treated with beta-blocker and statins.  From a neurological standpoint he clinically has anoxic brain injury.  During this hospitalization he experienced AKI and was treated with fluids and correction of hypoperfusion from cardiac arrest.  Echo performed this admission revealed ischemic cardiomyopathy with an EF of 40%  In addition to above patient has been unable to pass  swallowing evaluations and subsequently has undergone percutaneous PEG tube placement on 12/30.  Patient is awake and moving all extremities spontaneously but does not follow commands and does not verbalize palliative care has met with patient and family and has emphasized poor prognosis and will continue to meet with the family during the hospitalization.  Current plan is to transition to SNF bed once available.  Expect need for lifelong 24/7 care for all ADLs.  Subjective: Sleeping more than usual and appears depressed.  Has asked staff to leave him alone.  He is aware that his girlfriend has breast cancer and it is suspected this is contributing to his change in behavior noting he has been refusing medications and not eating.  Objective: Vitals:   11/09/20 2005 11/10/20 0532  BP: 135/77 127/71  Pulse: 79 77  Resp: 16 18  Temp: 98.4 F (36.9 C) 97.8 F (36.6 C)  SpO2: 100% 97%    Intake/Output Summary (Last 24 hours) at 11/10/2020 0809 Last data filed at 11/10/2020 0536 Gross per 24 hour  Intake 840 ml  Output 1000 ml  Net -160 ml   Filed Weights   11/08/20 0016 11/09/20 0559 11/10/20 0132  Weight: 86.6 kg 85.5 kg 85.4 kg    Exam: Constitutional: Awakens but wakes people often request to be left alone Heart: Normal heart sounds, pulse regular, extremities warm to touch Respiratory: Lungs are clear to auscultation.  Room air  Abdomen: LBM 3/19, Abdomen nontender.  Poor oral intake  over the past 48 hours. Neurologic: CN 2-12 intact, exam nonfocal.  Ambulatory Psychiatric: Awakens.  Oriented to name and place.  Not to year.  Very depressed.  Prefers to sleep and not eat.    Assessment/Plan: Acute problems: Out of hospital cardiac arrest secondary to STEMI -ROSC and subsequent cardiac catheterization revealed dz in circumflex requiring DES -Continue beta-blocker, statin, Plavix, and Imdur  Significant anoxic brain injury secondary to cardiac arrest/deconditioning/gait  instability -Consistently participating with PT and OT.  SLP has cleared for dysphagia 2 with thin liquids -Declined by CIR since family unable to provide 24/7 care.  Plan is for SNF -3/25 change Seroquel to bedtime dosing only 25 mg -3/25 discontinue Symmetrel  **PT NOTES FROM 3/23**  Pt received up in the chair; agreeable, interactive and laughing with PT. Session focused on continued gait/balance training and cognitive remediation. Pt ambulating hallway distances with a walker at a min guard assist level. Requires consistent cueing for obstacle negotiation and often runs into objects on the right. Presents as a high fall risk based on decreased gait speed and safety awareness. Continue to recommend SNF for ongoing Physical Therapy.     SLP notes 3/24:   Pt seen at bedside for skilled ST intervention targeting goals for cognitive/communication and swallowing. RN consulted SLP, requesting reassessment. RN reports Pt's PEG tube was removed yesterday, and pt is currently refusing all PO intake and meds. Pt was awake, seated upright in bed with RN present. He was nonvocal, and did not follow commands. Pt was noted to nod in response to yes/no questions. RN provided pt a small piece of pear from breakfast tray, which resulted in no oral manipulation, and no swallow. Pt did accept small boluses of liquid via straw, but held this as well. SLP set up suction to facilitate oral care and clearing, and cleaned pt oral cavity to remove PO trials. At this time, downgraded diet is recommended. Will change diet to full liquid to minimizing choking hazard of solid textures. Safe swallow precautions were updated at [facility], RN and MD informed. SLP will continue to follow to assess pt willingness to accept PO and appropriate diet advancement, as well as cognitive/communication treatments.   OT notes 3/24:  Patient admitted 110 days ago for the above diagnosis.  During this time Pilot has made progress with his mobility,  and certain aspects of his self care.  Unfortunately, OT believes he is most likely at his new cognitive baseline, and it is not expected, he will progress much beyond his current level for bathing, dressing, toileting and self feeding.  Over the past treatments, he has shown inconsistency in physical performance of ADL mainly due to continued balance deficits, and cognitive deficits.  Rylynn will need 24 hour minimal assist, supervision, and cueing for most, if not all, functional tasks.  He needs cues to initiate tasks, and cues to sequence through tasks to complete them, and this is not anticipated to improve.  This OT had conversations with NT and RN that are familiar with [name].  We discussed trying to establish a routine for [facility]: consistent awake times, times in the chair, rest times, ADL times, and a toileting schedule.  Both NT and RN verbalized they try to accomplish this, but as described above, inconsistencies with behavior, cooperation and cognitive capacity are the barriers.  OT continues to recommend 24 hour assist as needed, SNF is an option, but home with family and a toileting plan, could be considered if 24/7 is possible.  Of note: patient's diet was downgraded to full liquid 3/23 due to pocketing of solids, whereas earlier in the week he ate meat and potatoes with utensils and supervision without assist.  Please re-consult OT if definitive, documented improvements in cognition impacting ADL are noted.  Daltyn would benefit from a care planned routine that is followed consistently.        White lesions/rash on back and buttocks -Appeared after patient had laying in tube feeding    10/10/20                           10/10/20     10/16/2020     10/20/2020                                     10/20/2020 -Rash has resolved  Situational depression -Started Celexa 2/16-increased to 20 mg 2/18 and given increase in depression likely related to significant other's diagnosis of breast cancer on  3/25 will increase Celexa to 40 mg  Ischemic cardiomyopathy -Echo this admission EF 40% -Given difficulty taking oral medications consistently have opted to transition to once daily dosing as follows: Change Coreg to Toprol-XL 12.5 daily Discontinue hydralazine Changed 3 times daily Isordil to Imdur 30 mg daily -No ACE inhibitor secondary to underlying kidney disease -Follow-up echocardiogram done March 2022 demonstrates an EF of 40 to 45% with LVH and grade 1 diastolic dysfunction physiology  Nutrition Status: Dysphagia with moderate protein calorie malnutrition Nutrition Problem: Increased nutrient needs Etiology: chronic illness (CKD3b) Signs/Symptoms: estimated needs Interventions: Ensure Enlive (each supplement provides 350kcal and 20 grams of protein),MVI,Magic cup,Prostat,Refer to RD note for recommendations PEG tube discontinued on 3/23 after patient demonstrated several weeks of consistent intake of oral diet and medications 3/24 unfortunately since PEG tube has been discontinued patient is now inconsistently eating and refusing medications overnight and this morning 3/24 follow-up swallowing evaluation per SLP: Patient appears to not be cooperative with exam and was holding liquids in his mouth.  Because of this finding SLP had no choice but to decrease diet back to full liquids but will continue to follow. Continue Ensure protein beverages twice daily between meals; Prosource discontinued since this medication needs to be given via tube given disgusting taste Estimated body mass index is 25.53 kg/m as calculated from the following:   Height as of this encounter: 6' (1.829 m).   Weight as of this encounter: 85.4 kg.  Hypertension -Was on Norvasc prior to admission -Continue cardiovascular meds as listed above  Acute on chronic kidney disease stage IIIb -Suspect prerenal etiology in the setting of hypoperfusion from ventricular fibrillation arrest -Baseline creatinine between  1.6 and 2-creatinine as of 3/14 stable at 1.33    Other problems: Acute hypernatremia -Continue free water per tube  E. coli UTI -Has completed 5 days of Bactrim  Hyperglycemia without an underlying diagnosis of diabetes mellitus Resolved -Likely related to acute stress -Hemoglobin A1c 4.7   Acute hypoxemic respiratory failure -Resolved -Stable on room air with O2 sats between 97 and 99%  Nonsustained VT -Continue amiodarone and beta-blocker -1/31 LFTs normal  Skin tear right lateral back Wound / Incision (Open or Dehisced) 07/28/20 Skin tear Back Lateral;Right;Upper (Active)  Date First Assessed/Time First Assessed: 07/28/20 0300   Wound Type: Skin tear  Location: Back  Location Orientation: Lateral;Right;Upper  Present on Admission: No    Assessments 07/28/2020  7:49 PM 11/08/2020  9:00 AM  Dressing Type None None  Drainage Amount None --  Treatment Cleansed --     No Linked orders to display    Data Reviewed: Basic Metabolic Panel: No results for input(s): NA, K, CL, CO2, GLUCOSE, BUN, CREATININE, CALCIUM, MG, PHOS in the last 168 hours. Liver Function Tests: No results for input(s): AST, ALT, ALKPHOS, BILITOT, PROT, ALBUMIN in the last 168 hours. No results for input(s): LIPASE, AMYLASE in the last 168 hours. No results for input(s): AMMONIA in the last 168 hours. CBC: No results for input(s): WBC, NEUTROABS, HGB, HCT, MCV, PLT in the last 168 hours. Cardiac Enzymes: No results for input(s): CKTOTAL, CKMB, CKMBINDEX, TROPONINI in the last 168 hours. BNP (last 3 results) No results for input(s): BNP in the last 8760 hours.  ProBNP (last 3 results) No results for input(s): PROBNP in the last 8760 hours.  CBG: Recent Labs  Lab 11/07/20 0605 11/07/20 1217 11/08/20 0013 11/08/20 0607 11/08/20 1204  GLUCAP 116* 133* 116* 117* 124*    No results found for this or any previous visit (from the past 240 hour(s)).   Studies: IR GASTROSTOMY TUBE  REMOVAL/REPAIR  Result Date: 11/08/2020 INDICATION: Patient history of dysphagia protein calorie malnutrition s/p gastrostomy tube placement in IR 08/17/2020. Patient now tolerating p.o. intake. Request is made for removal of gastrostomy tube. EXAM: BEDSIDE REMOVAL OF GASTROSTOMY TUBE COMPARISON:  None. MEDICATIONS: 6 mL 1% viscous lidocaine CONTRAST:  None FLUOROSCOPY TIME:  None COMPLICATIONS: None immediate. PROCEDURE: A time-out was performed prior to the initiation of the procedure. The track of the existing gastrostomy tube was lubricated with viscous lidocaine. Using manual traction, the existing pull-through gastrostomy tube was removed intact. A dressing was placed. The patient tolerated the procedure well without immediate postprocedural complication. IMPRESSION: Successful bedside removal of pull-through gastrostomy tube without complication Read by: Earley Abide, PA-C Electronically Signed   By: Aletta Edouard M.D.   On: 11/08/2020 15:40    Scheduled Meds: . amantadine  100 mg Oral Daily  . aspirin  81 mg Oral Daily  . atorvastatin  80 mg Oral Daily  . carvedilol  12.5 mg Oral BID WC  . chlorhexidine gluconate (MEDLINE KIT)  15 mL Mouth Rinse BID  . citalopram  20 mg Oral Daily  . clopidogrel  75 mg Oral Daily  . enoxaparin (LOVENOX) injection  40 mg Subcutaneous Q24H  . feeding supplement  237 mL Oral BID BM  . hydrALAZINE  25 mg Oral Q8H  . isosorbide dinitrate  10 mg Oral TID  . mouth rinse  15 mL Mouth Rinse BID  . multivitamin with minerals  1 tablet Oral Daily  . polyvinyl alcohol  1 drop Both Eyes QID  . QUEtiapine  25 mg Oral Daily  . sodium chloride flush  3 mL Intravenous Q12H   Continuous Infusions:   Active Problems:   Acute ST elevation myocardial infarction (STEMI) (HCC)   Cardiac arrest (HCC)   Elevated blood pressure reading with diagnosis of hypertension   Shock circulatory (HCC)   Encephalopathy   History of ETT   Cardiomyopathy, ischemic   Anoxic  brain damage ([place])   Dysphagia   DNR (do not resuscitate) discussion   Gait instability   E. coli UTI   Essential hypertension   Consultants:  Cardiology  Interventional radiology  PCCM  Neurology  Procedures:  Cardiac catheterization 12/4  Continuous EEG 12/4 and overnight EEG with video 12/5  Echocardiogram 12/5  EEG  12/9  Percutaneous PEG tube placement 12/30  Antibiotics: Anti-infectives (From admission, onward)   Start     Dose/Rate Route Frequency Ordered Stop   10/14/20 1000  fluconazole (DIFLUCAN) tablet 100 mg        100 mg Oral Daily 10/14/20 0848 10/18/20 0932   09/13/20 1000  sulfamethoxazole-trimethoprim (BACTRIM) 200-40 MG/5ML suspension 20 mL        20 mL Per Tube Every 12 hours 09/13/20 0734 09/19/20 0926   09/13/20 0745  cefTRIAXone (ROCEPHIN) 1 g in sodium chloride 0.9 % 100 mL IVPB  Status:  Discontinued        1 g 200 mL/hr over 30 Minutes Intravenous Every 24 hours 09/13/20 0658 09/13/20 0734   08/17/20 0000  ceFAZolin (ANCEF) IVPB 2g/100 mL premix        2 g 200 mL/hr over 30 Minutes Intravenous To Radiology 08/15/20 1346 08/17/20 1111   07/22/20 0830  cefTRIAXone (ROCEPHIN) 2 g in sodium chloride 0.9 % 100 mL IVPB        2 g 200 mL/hr over 30 Minutes Intravenous Every 24 hours 07/22/20 0720 07/26/20 0858       Time spent: 20 minutes    Erin Hearing ANP  [facility] 7 am - 330 pm/M-F for direct patient care and secure chat Please refer to Amion for contact information 111 days

## 2020-11-11 LAB — CBC WITH DIFFERENTIAL/PLATELET
Abs Immature Granulocytes: 0.02 10*3/uL (ref 0.00–0.07)
Basophils Absolute: 0.1 10*3/uL (ref 0.0–0.1)
Basophils Relative: 1 %
Eosinophils Absolute: 0.4 10*3/uL (ref 0.0–0.5)
Eosinophils Relative: 5 %
HCT: 38.3 % — ABNORMAL LOW (ref 39.0–52.0)
Hemoglobin: 12.4 g/dL — ABNORMAL LOW (ref 13.0–17.0)
Immature Granulocytes: 0 %
Lymphocytes Relative: 42 %
Lymphs Abs: 3.1 10*3/uL (ref 0.7–4.0)
MCH: 30.7 pg (ref 26.0–34.0)
MCHC: 32.4 g/dL (ref 30.0–36.0)
MCV: 94.8 fL (ref 80.0–100.0)
Monocytes Absolute: 0.7 10*3/uL (ref 0.1–1.0)
Monocytes Relative: 10 %
Neutro Abs: 3.1 10*3/uL (ref 1.7–7.7)
Neutrophils Relative %: 42 %
Platelets: 177 10*3/uL (ref 150–400)
RBC: 4.04 MIL/uL — ABNORMAL LOW (ref 4.22–5.81)
RDW: 13.9 % (ref 11.5–15.5)
WBC: 7.5 10*3/uL (ref 4.0–10.5)
nRBC: 0 % (ref 0.0–0.2)

## 2020-11-11 LAB — COMPREHENSIVE METABOLIC PANEL
ALT: 34 U/L (ref 0–44)
AST: 19 U/L (ref 15–41)
Albumin: 3.2 g/dL — ABNORMAL LOW (ref 3.5–5.0)
Alkaline Phosphatase: 74 U/L (ref 38–126)
Anion gap: 9 (ref 5–15)
BUN: 10 mg/dL (ref 6–20)
CO2: 26 mmol/L (ref 22–32)
Calcium: 9.5 mg/dL (ref 8.9–10.3)
Chloride: 105 mmol/L (ref 98–111)
Creatinine, Ser: 1.26 mg/dL — ABNORMAL HIGH (ref 0.61–1.24)
GFR, Estimated: >60 mL/min (ref >60)
Glucose, Bld: 102 mg/dL — ABNORMAL HIGH (ref 70–99)
Potassium: 3.5 mmol/L (ref 3.5–5.1)
Sodium: 140 mmol/L (ref 135–145)
Total Bilirubin: 1 mg/dL (ref 0.3–1.2)
Total Protein: 7.1 g/dL (ref 6.5–8.1)

## 2020-11-11 NOTE — Progress Notes (Signed)
PROGRESS NOTE    Antonio Navarro  WPY:[id number] DOB: 08-26-1960 DOA: 07/22/2020 PCP: Patient, No Pcp Per    Brief Narrative:  Mr.Palermo was admitted to the hospital after a cardiac arrest secondary to ST elevation myocardial infarction. Patient with prolonged hospitalization due to significant anoxic brain injury, unsafe discharge.  60 year old male who was brought to the hospital after cardiac arrest, his wife found him agonal in his bed, 911 was called, apparently sick minutes without resuscitative efforts, when EMS arrived he was found in ventricular fibrillation.  An advanced airway was placed and patient received 6 cardioversion attempts along with ACLS maneuvers he received amiodarone and 6 rounds of epinephrine.  He returned to spontaneous circulation was brought to the hospital.  Estimated CPR efforts 30 minutes. In the emergency department he was diagnosed with ST elevation myocardial infarction and he was taken emergently to the Cath Lab.  A drug-eluting stent was placed in the circumflex.  Unfortunately patient developed anoxic brain injury, prolonged hospitalization with multiple complications, including acute kidney injury, urinary tract infection, hyponatremia and swallow dysfunction.  Patient required a PEG tube which was inserted 08/17/2020, patient's swallow dysfunction improved and PEG tube was removed on 03/23.   Patient's neurologic function has improved, but continue to have significant deconditioning. Unsafe home discharge.   Patient currently waiting for placement.  Assessment & Plan:   Active Problems:   Acute ST elevation myocardial infarction (STEMI) (HCC)   Cardiac arrest (HCC)   Elevated blood pressure reading with diagnosis of hypertension   Shock circulatory (HCC)   Encephalopathy   History of ETT   Cardiomyopathy, ischemic   Anoxic brain damage ([place])   Dysphagia   DNR (do not resuscitate) discussion   Gait instability   E. coli UTI   Essential  hypertension   Situational depression   1. Acute ST elevation myocardial infarction, ischemic cardiomyopathy, CAD sp DES circumflex. Non sustained VT.  Patient has remained chest pain free, no further arrhythmias, now off telemetry monitoring.  Continue with dual antiplatelet therapy with aspirin and clopidogrel. On isosorbide mononitrate and metoprolol succinate.   Statin therapy.   2. Anoxic brain injury, with swallow dysfunction,  (acute hypoxemic respiratory failure). Neurologic condition has been improving, but continue to be very weak and deconditioned, considered not safe home discharge.  Pending placement. Continue with PT/OT and speech therapy.  On dysphagia 3 diet.  Able to respond to simple questions and follow simple commands.   3. HTN/ dyslipidemia. Continue blood pressure control with metoprolol  Continue with atorvastatin.   4. AKI on CKD stage 3b/ hyponatremia renal function has been stable, continue to avoid hypotension and nephrotoxic medications.   5. Pressure skin tear. Right lateral back, not present on admission, continue frequent turning and skin care.   6. Depression. Continue with quetiapine and citalopram.    Status is: Inpatient  Remains inpatient appropriate because:Unsafe d/c plan   Dispo: The patient is from: Home              Anticipated d/c is to: SNF              Patient currently is medically stable to d/c.   Difficult to place patient Yes   DVT prophylaxis: Enoxaparin   Code Status:   full  Family Communication:  No family at the bedside      Nutrition Status: Nutrition Problem: Increased nutrient needs Etiology: chronic illness (CKD3b) Signs/Symptoms: estimated needs Interventions: Ensure Enlive (each supplement provides 350kcal and 20 grams  of protein),MVI,Magic cup,Prostat,Refer to RD note for recommendations      Consultants:   Cardiology   Procedures:    cardiac catheterization and angioplasty       Subjective: Patient denies any chest pain, no nausea or vomiting, no abdominal pain or dyspnea. Continue to be very weak and deconditioned   Objective: Vitals:   11/10/20 1140 11/10/20 2006 11/11/20 0053 11/11/20 0436  BP: (!) 158/100 (!) 147/96  104/75  Pulse: 74 83  93  Resp: '18 18  18  ' Temp:  98.2 F (36.8 C)  99.2 F (37.3 C)  TempSrc:  Oral  Oral  SpO2: 92% 98%  98%  Weight:   84.3 kg   Height:        Intake/Output Summary (Last 24 hours) at 11/11/2020 1132 Last data filed at 11/10/2020 2000 Gross per 24 hour  Intake 560 ml  Output --  Net 560 ml   Filed Weights   11/09/20 0559 11/10/20 0132 11/11/20 0053  Weight: 85.5 kg 85.4 kg 84.3 kg    Examination:   General: deconditioned  Neurology: Awake and alert, moves all 4 extremities, follows simple commands, and respond to simple questions. Did not want or could not engage in more advance conversation.  E ENT: mild pallor, no icterus, oral mucosa moist Cardiovascular: No JVD. S1-S2 present, rhythmic, no gallops, rubs, or murmurs. No lower extremity edema. Pulmonary: positive breath sounds bilaterally, adequate air movement, no wheezing, rhonchi or rales. Gastrointestinal. Abdomen soft and non tender Skin. No rashes Musculoskeletal: no joint deformities     Data Reviewed: I have personally reviewed following labs and imaging studies  CBC: Recent Labs  Lab 11/11/20 0219  WBC 7.5  NEUTROABS 3.1  HGB 12.4*  HCT 38.3*  MCV 94.8  PLT 468   Basic Metabolic Panel: Recent Labs  Lab 11/11/20 0219  NA 140  K 3.5  CL 105  CO2 26  GLUCOSE 102*  BUN 10  CREATININE 1.26*  CALCIUM 9.5   GFR: Estimated Creatinine Clearance: 70.1 mL/min (A) (by C-G formula based on SCr of 1.26 mg/dL (H)). Liver Function Tests: Recent Labs  Lab 11/11/20 0219  AST 19  ALT 34  ALKPHOS 74  BILITOT 1.0  PROT 7.1  ALBUMIN 3.2*   No results for input(s): LIPASE, AMYLASE in the last 168 hours. No results for input(s):  AMMONIA in the last 168 hours. Coagulation Profile: No results for input(s): INR, PROTIME in the last 168 hours. Cardiac Enzymes: No results for input(s): CKTOTAL, CKMB, CKMBINDEX, TROPONINI in the last 168 hours. BNP (last 3 results) No results for input(s): PROBNP in the last 8760 hours. HbA1C: No results for input(s): HGBA1C in the last 72 hours. CBG: Recent Labs  Lab 11/07/20 0605 11/07/20 1217 11/08/20 0013 11/08/20 0607 11/08/20 1204  GLUCAP 116* 133* 116* 117* 124*   Lipid Profile: No results for input(s): CHOL, HDL, LDLCALC, TRIG, CHOLHDL, LDLDIRECT in the last 72 hours. Thyroid Function Tests: No results for input(s): TSH, T4TOTAL, FREET4, T3FREE, THYROIDAB in the last 72 hours. Anemia Panel: No results for input(s): VITAMINB12, FOLATE, FERRITIN, TIBC, IRON, RETICCTPCT in the last 72 hours.    Radiology Studies: I have reviewed all of the imaging during this hospital visit personally     Scheduled Meds: . aspirin  81 mg Oral Daily  . atorvastatin  80 mg Oral Daily  . chlorhexidine gluconate (MEDLINE KIT)  15 mL Mouth Rinse BID  . citalopram  40 mg Oral Daily  . clopidogrel  75 mg Oral Daily  . enoxaparin (LOVENOX) injection  40 mg Subcutaneous Q24H  . feeding supplement  237 mL Oral BID BM  . isosorbide mononitrate  30 mg Oral Daily  . mouth rinse  15 mL Mouth Rinse BID  . metoprolol succinate  12.5 mg Oral Daily  . multivitamin with minerals  1 tablet Oral Daily  . polyvinyl alcohol  1 drop Both Eyes QID  . QUEtiapine  25 mg Oral QHS  . sodium chloride flush  3 mL Intravenous Q12H   Continuous Infusions:   LOS: 112 days        Rayanna Matusik Gerome Apley, MD

## 2020-11-12 NOTE — Plan of Care (Signed)
  Problem: Clinical Measurements: Goal: Ability to maintain clinical measurements within normal limits will improve Outcome: Progressing   

## 2020-11-12 NOTE — Plan of Care (Signed)
  Problem: Clinical Measurements: Goal: Ability to maintain clinical measurements within normal limits will improve Outcome: Adequate for Discharge   

## 2020-11-12 NOTE — Progress Notes (Signed)
PROGRESS NOTE    Antonio Navarro  QKM:[id number] DOB: 18-Jun-1961 DOA: 07/22/2020 PCP: Patient, No Pcp Per    Brief Narrative:  Antonio Navarro was admitted to the hospital after a cardiac arrest secondary to ST elevation myocardial infarction. Patient with prolonged hospitalization due to significant anoxic brain injury, unsafe discharge.  60 year old male who was brought to the hospital after cardiac arrest, his wife found him agonal in his bed, 911 was called, apparently sick minutes without resuscitative efforts, when EMS arrived he was found in ventricular fibrillation.  An advanced airway was placed and patient received 6 cardioversion attempts along with ACLS maneuvers he received amiodarone and 6 rounds of epinephrine.  He returned to spontaneous circulation was brought to the hospital.  Estimated CPR efforts 30 minutes. In the emergency department he was diagnosed with ST elevation myocardial infarction and he was taken emergently to the Cath Lab.  A drug-eluting stent was placed in the circumflex.  Unfortunately patient developed anoxic brain injury, prolonged hospitalization with multiple complications, including acute kidney injury, urinary tract infection, hyponatremia and swallow dysfunction.  Patient required a PEG tube which was inserted 08/17/2020, patient's swallow dysfunction improved and PEG tube was removed on 03/23.   Patient's neurologic function has improved, but continue to have significant deconditioning. Unsafe home discharge.   Patient currently waiting for placement.   Assessment & Plan:   Active Problems:   Acute ST elevation myocardial infarction (STEMI) (HCC)   Cardiac arrest (HCC)   Elevated blood pressure reading with diagnosis of hypertension   Shock circulatory (HCC)   Encephalopathy   History of ETT   Cardiomyopathy, ischemic   Anoxic brain damage ([facility])   Dysphagia   DNR (do not resuscitate) discussion   Gait instability   E. coli UTI   Essential  hypertension   Situational depression    1. Acute ST elevation myocardial infarction, ischemic cardiomyopathy, CAD sp DES circumflex. Non sustained VT.  Patient has remained chest pain free, no further arrhythmias, now off telemetry monitoring.  Plan to continue with dual antiplatelet therapy with aspirin and clopidogrel.  Continue with isosorbide mononitrate and metoprolol succinate, along with statin therapy.  Patient very weak and deconditioned, not able to go home, pending SNF placement.   2. Anoxic brain injury, with swallow dysfunction,  (acute hypoxemic respiratory failure). Neurologic condition has been improving, but continue to be very weak and deconditioned, considered not safe home discharge.   Continue with dysphagia 3 diet.  This am is less communicative than yesterday, moves all 4 extremities. Out of bed to chair tid with meals as tolerated.   3. HTN/ dyslipidemia. On metoprolol for blood pressure control.   On atorvastatin.   4. AKI on CKD stage 3b/ hyponatremia.  Intermittent check on renal function. Serum cr has been stable.   5. Pressure skin tear. Right lateral back, not present on admission, frequent turning and skin care.   6. Depression. On quetiapine and citalopram.   Status is: Inpatient  Remains inpatient appropriate because:Unsafe d/c plan   Dispo: The patient is from: Home              Anticipated d/c is to: SNF              Patient currently is medically stable to d/c.   Difficult to place patient No   DVT prophylaxis: Enoxaparin   Code Status:   full  Family Communication:  No family at the bedside      Nutrition Status: Nutrition Problem:  Increased nutrient needs Etiology: chronic illness (CKD3b) Signs/Symptoms: estimated needs Interventions: Ensure Enlive (each supplement provides 350kcal and 20 grams of protein),MVI,Magic cup,Prostat,Refer to RD note for recommendations   Consultants:   Cardiology   Procedures:     cardiac catheterization and angioplasty     Subjective: Patient less communicative than yesterday, denies any chest pain or dyspnea. Per nursing he has been taking his oral medications.   Objective: Vitals:   11/11/20 1145 11/11/20 1950 11/12/20 0436 11/12/20 0509  BP: 127/90 (!) 154/91 (!) 147/97   Pulse: 80 80 88   Resp: '18 18 18   ' Temp: 98.5 F (36.9 C) 98.2 F (36.8 C) 99.5 F (37.5 C)   TempSrc: Oral Oral Oral   SpO2: 100% 98% 100%   Weight:    84 kg  Height:        Intake/Output Summary (Last 24 hours) at 11/12/2020 0938 Last data filed at 11/12/2020 0855 Gross per 24 hour  Intake 714 ml  Output 300 ml  Net 414 ml   Filed Weights   11/10/20 0132 11/11/20 0053 11/12/20 0509  Weight: 85.4 kg 84.3 kg 84 kg    Examination:   General: Not in pain or dyspnea, deconditioned  Neurology: Awake and alert, non focal  E ENT: no pallor, no icterus, oral mucosa moist Cardiovascular: No JVD. S1-S2 present, rhythmic, no gallops, rubs, or murmurs. No lower extremity edema. Pulmonary: positive breath sounds bilaterally, adequate air movement, no wheezing, rhonchi or rales. Gastrointestinal. Abdomen soft and non tender Skin. No rashes Musculoskeletal: no joint deformities     Data Reviewed: I have personally reviewed following labs and imaging studies  CBC: Recent Labs  Lab 11/11/20 0219  WBC 7.5  NEUTROABS 3.1  HGB 12.4*  HCT 38.3*  MCV 94.8  PLT 144   Basic Metabolic Panel: Recent Labs  Lab 11/11/20 0219  NA 140  K 3.5  CL 105  CO2 26  GLUCOSE 102*  BUN 10  CREATININE 1.26*  CALCIUM 9.5   GFR: Estimated Creatinine Clearance: 70.1 mL/min (A) (by C-G formula based on SCr of 1.26 mg/dL (H)). Liver Function Tests: Recent Labs  Lab 11/11/20 0219  AST 19  ALT 34  ALKPHOS 74  BILITOT 1.0  PROT 7.1  ALBUMIN 3.2*   No results for input(s): LIPASE, AMYLASE in the last 168 hours. No results for input(s): AMMONIA in the last 168 hours. Coagulation  Profile: No results for input(s): INR, PROTIME in the last 168 hours. Cardiac Enzymes: No results for input(s): CKTOTAL, CKMB, CKMBINDEX, TROPONINI in the last 168 hours. BNP (last 3 results) No results for input(s): PROBNP in the last 8760 hours. HbA1C: No results for input(s): HGBA1C in the last 72 hours. CBG: Recent Labs  Lab 11/07/20 0605 11/07/20 1217 11/08/20 0013 11/08/20 0607 11/08/20 1204  GLUCAP 116* 133* 116* 117* 124*   Lipid Profile: No results for input(s): CHOL, HDL, LDLCALC, TRIG, CHOLHDL, LDLDIRECT in the last 72 hours. Thyroid Function Tests: No results for input(s): TSH, T4TOTAL, FREET4, T3FREE, THYROIDAB in the last 72 hours. Anemia Panel: No results for input(s): VITAMINB12, FOLATE, FERRITIN, TIBC, IRON, RETICCTPCT in the last 72 hours.    Radiology Studies: I have reviewed all of the imaging during this hospital visit personally     Scheduled Meds: . aspirin  81 mg Oral Daily  . atorvastatin  80 mg Oral Daily  . chlorhexidine gluconate (MEDLINE KIT)  15 mL Mouth Rinse BID  . citalopram  40 mg Oral Daily  .  clopidogrel  75 mg Oral Daily  . enoxaparin (LOVENOX) injection  40 mg Subcutaneous Q24H  . feeding supplement  237 mL Oral BID BM  . isosorbide mononitrate  30 mg Oral Daily  . mouth rinse  15 mL Mouth Rinse BID  . metoprolol succinate  12.5 mg Oral Daily  . multivitamin with minerals  1 tablet Oral Daily  . polyvinyl alcohol  1 drop Both Eyes QID  . QUEtiapine  25 mg Oral QHS  . sodium chloride flush  3 mL Intravenous Q12H   Continuous Infusions:   LOS: 113 days        Mauricio Gerome Apley, MD

## 2020-11-13 NOTE — Progress Notes (Signed)
Physical Therapy Treatment Patient Details Name: Antonio Navarro MRN: [id number] DOB: [birth year] Today's Date: 11/13/2020    History of Present Illness Pt is a 60 y.o. male admitted 07/22/20 post-cardiac arrest with STEMI; found to be in V fib with resucitation by EMS in the field, approximately 30-min of CPR. ETT 12/4-12/13. MRI 12/7 suggestive of a widespread anoxic injury. S/p PEG tube 12/30. PEG removed 3/23. PMH includes tobacco abuse, HTN, chronic back pain.  PEG tube removed.    PT Comments    Treatment limited today as pt internally distracted and seemingly uncomfortable. Once pt was assisted up to edge of bed, pt spitting out pocketed food. Following, pt with increased saliva production and consistently spitting and rubbing tongue with a paper towel throughout rest of session. Walked pt to sink to brush his teeth and rinse, though did not seem to alleviate discomfort. Pt was able to have a bowel movement when prompted to sit down on toilet.  RN/SLP notified of above. Will continue to follow.     Follow Up Recommendations  SNF;Supervision/Assistance - 24 hour     Equipment Recommendations  Rolling walker with 5" wheels;3in1 (PT);Wheelchair (measurements PT);Wheelchair cushion (measurements PT)    Recommendations for Other Services       Precautions / Restrictions Precautions Precautions: Fall Restrictions Weight Bearing Restrictions: No    Mobility  Bed Mobility Overal bed mobility: Needs Assistance Bed Mobility: Sit to Supine;Supine to Sit     Supine to sit: Mod assist Sit to supine: Min assist   General bed mobility comments: ModA for trunk to upright, assist for BLE negotiation back into bed    Transfers Overall transfer level: Needs assistance Equipment used: Rolling walker (2 wheeled) Transfers: Sit to/from Stand Sit to Stand: Min assist;Mod assist         General transfer comment: Min-modA to rise from edge of bed and  toilet  Ambulation/Gait Ambulation/Gait assistance: Min assist Gait Distance (Feet): 30 Feet Assistive device: Rolling walker (2 wheeled) Gait Pattern/deviations: Step-through pattern;Narrow base of support;Decreased stride length Gait velocity: reduced   General Gait Details: Dynamic instability, requiring minA for balance. Assist for walker negotiation over unlevel surface   Stairs             Wheelchair Mobility    Modified Rankin (Stroke Patients Only)       Balance Overall balance assessment: Needs assistance Sitting-balance support: No upper extremity supported;Feet supported Sitting balance-Leahy Scale: Good     Standing balance support: Bilateral upper extremity supported Standing balance-Leahy Scale: Poor Standing balance comment: BUE support for mobility this session                            Cognition Arousal/Alertness: Awake/alert Behavior During Therapy: Impulsive Overall Cognitive Status: Impaired/Different from baseline Area of Impairment: Attention;Following commands;Safety/judgement;Awareness;Problem solving                   Current Attention Level: Sustained   Following Commands: Follows one step commands with increased time Safety/Judgement: Decreased awareness of safety;Decreased awareness of deficits Awareness: Intellectual Problem Solving: Slow processing;Difficulty sequencing;Requires verbal cues;Requires tactile cues        Exercises      General Comments        Pertinent Vitals/Pain Pain Assessment: Faces Faces Pain Scale: No hurt    Home Living  Prior Function            PT Goals (current goals can now be found in the care plan section) Acute Rehab PT Goals Patient Stated Goal: pt fiance would like him to get intensive rehab Potential to Achieve Goals: Fair    Frequency    Min 3X/week      PT Plan Current plan remains appropriate    Co-evaluation               AM-PAC PT "6 Clicks" Mobility   Outcome Measure  Help needed turning from your back to your side while in a flat bed without using bedrails?: A Little Help needed moving from lying on your back to sitting on the side of a flat bed without using bedrails?: A Lot Help needed moving to and from a bed to a chair (including a wheelchair)?: A Little Help needed standing up from a chair using your arms (e.g., wheelchair or bedside chair)?: A Lot Help needed to walk in hospital room?: A Little Help needed climbing 3-5 steps with a railing? : A Lot 6 Click Score: 15    End of Session Equipment Utilized During Treatment: Gait belt Activity Tolerance: Other (comment) (limited by internal discomfort) Patient left: with call bell/phone within reach;in bed;with bed alarm set;with family/visitor present Nurse Communication: Mobility status PT Visit Diagnosis: Other abnormalities of gait and mobility (R26.89);Difficulty in walking, not elsewhere classified (R26.2)     Time: 2355-7322 PT Time Calculation (min) (ACUTE ONLY): 39 min  Charges:  $Therapeutic Activity: 38-52 mins                     Wyona Almas, PT, DPT Acute Rehabilitation Services Pager [phone] Office [phone]    Deno Etienne 11/13/2020, 3:01 PM

## 2020-11-13 NOTE — Progress Notes (Signed)
[facility] PROGRESS NOTE  Antonio Navarro OAC:[id number] DOB: 02-Feb-1961 DOA: 07/22/2020 PCP: Patient, No Pcp Per       Status: Remains inpatient appropriate because:Altered mental status and Unsafe d/c plan   Dispo: The patient is from: Home              Anticipated d/c is to: SNF              Anticipated d/c date is: > 3 days              Patient currently is medically stable to d/c.  Barriers to discharge: Medicaid and disability pending. No SNF bed offers.  No further nocturnal agitation or wandering behavior so hopeful will be eligible for SNF bed offer.  Patient able to ambulate 300 feet so hopefully he can eventually discharge home with family as long as they can provide appropriate 24/7 monitoring.  On 2/28 TOC contacted at [facility] [place]   Code Status: Full Family Communication: 3/22 updated Sister Antonio Navarro by telephone DVT prophylaxis: sq heparin Vaccination status: Completed both doses of Pfizer Covid vaccination on 12/14/2019; Zavalla booster ordered to be given on 3/3   Foley catheter: No  HPI: 60 year old male history of hypertension prescribed Norvasc prior to admission, chronic back pain, prior renal calculi who experienced an out of hospital cardiac arrest with downtime of 9 minutes.  Initial rhythm was ventricular fibrillation.  He was defibrillated deceived 6 doses of epi before ROSC.  Subsequent work-up revealed STEMI.  He underwent cardiac catheterization with a DES placed to the circumflex.  Cardiology following and has placed patient on Brilinta and aspirin and is also being treated with beta-blocker and statins.  From a neurological standpoint he clinically has anoxic brain injury.  During this hospitalization he experienced AKI and was treated with fluids and correction of hypoperfusion from cardiac arrest.  Echo performed this admission revealed ischemic cardiomyopathy with an EF of 40%  In addition to above patient has been unable to pass  swallowing evaluations and subsequently has undergone percutaneous PEG tube placement on 12/30.  Patient is awake and moving all extremities spontaneously but does not follow commands and does not verbalize palliative care has met with patient and family and has emphasized poor prognosis and will continue to meet with the family during the hospitalization.  Current plan is to transition to SNF bed once available.  Expect need for lifelong 24/7 care for all ADLs.  Subjective: Sleeping in which is not atypical for this patient.  Did not awaken during exam briefly opened eyes but went back to sleep  Objective: Vitals:   11/12/20 2051 11/13/20 0343  BP: 139/87 111/83  Pulse: 92 79  Resp: 18 18  Temp: 98.6 F (37 C) 99 F (37.2 C)  SpO2: 99% 97%    Intake/Output Summary (Last 24 hours) at 11/13/2020 0756 Last data filed at 11/13/2020 0400 Gross per 24 hour  Intake 1212 ml  Output 1050 ml  Net 162 ml   Filed Weights   11/11/20 0053 11/12/20 0509 11/13/20 0018  Weight: 84.3 kg 84 kg 85.2 kg    Exam: Constitutional: Sleeping but appears to be in no acute distress Heart: Normal heart sounds, no tachycardia, no peripheral edema Respiratory: Lungs are clear to auscultation, stable on room air Abdomen: LBM 3/19, eating better now that he is on D3 diet.  Abdominal exam unremarkable.  Old PEG site unremarkable. Neurologic: CN 2-12 intact, exam nonfocal.  Ambulatory Psychiatric: Sleeping but at baseline  oriented times name.  He is also aware he is not at the hospital but has difficulty communicating secondary to aphasia and dysarthria.  At baseline typically has a very pleasant affect.    Assessment/Plan: Acute problems: Out of hospital cardiac arrest secondary to STEMI -ROSC and subsequent cardiac catheterization revealed dz in circumflex requiring DES -Continue beta-blocker, statin, Plavix, and Imdur  Significant anoxic brain injury secondary to cardiac arrest/deconditioning/gait  instability -Consistently participating with PT and OT.  SLP has cleared for dysphagia 2 with thin liquids -Declined by CIR since family unable to provide 24/7 care.  Plan is for SNF -3/25 changed Seroquel to bedtime dosing only 25 mg -3/25 discontinued Symmetrel  **PT NOTES FROM 3/25**  Pt tolerates treatment well, initially resistant to sitting at edge of bed but becoming more participatory with encouragement from fiance. Pt demonstrates increased tone in LEs with bed mobility and has difficulty coordinating UEs for fine motor tasks, ie donning mask. Pt will benefit from continued acute PT services to improve dynamic balance and to reduce falls risk. PT continues to recommend SNF placement.    SLP notes 3/24:   Pt seen at bedside for skilled ST intervention targeting goals for cognitive/communication and swallowing. RN consulted SLP, requesting reassessment. RN reports Pt's PEG tube was removed yesterday, and pt is currently refusing all PO intake and meds. Pt was awake, seated upright in bed with RN present. He was nonvocal, and did not follow commands. Pt was noted to nod in response to yes/no questions. RN provided pt a small piece of pear from breakfast tray, which resulted in no oral manipulation, and no swallow. Pt did accept small boluses of liquid via straw, but held this as well. SLP set up suction to facilitate oral care and clearing, and cleaned pt oral cavity to remove PO trials. At this time, downgraded diet is recommended. Will change diet to full liquid to minimizing choking hazard of solid textures. Safe swallow precautions were updated at [facility], RN and MD informed. SLP will continue to follow to assess pt willingness to accept PO and appropriate diet advancement, as well as cognitive/communication treatments.   OT notes 3/24:  Patient admitted 110 days ago for the above diagnosis.  During this time Antonio Navarro has made progress with his mobility, and certain aspects of his self care.   Unfortunately, OT believes he is most likely at his new cognitive baseline, and it is not expected, he will progress much beyond his current level for bathing, dressing, toileting and self feeding.  Over the past treatments, he has shown inconsistency in physical performance of ADL mainly due to continued balance deficits, and cognitive deficits.  Antonio Navarro will need 24 hour minimal assist, supervision, and cueing for most, if not all, functional tasks.  He needs cues to initiate tasks, and cues to sequence through tasks to complete them, and this is not anticipated to improve.  This OT had conversations with NT and RN that are familiar with Antonio Navarro.  We discussed trying to establish a routine for [facility]: consistent awake times, times in the chair, rest times, ADL times, and a toileting schedule.  Both NT and RN verbalized they try to accomplish this, but as described above, inconsistencies with behavior, cooperation and cognitive capacity are the barriers.  OT continues to recommend 24 hour assist as needed, SNF is an option, but home with family and a toileting plan, could be considered if 24/7 is possible.  Of note: patient's diet was downgraded to full liquid 3/23  due to pocketing of solids, whereas earlier in the week he ate meat and potatoes with utensils and supervision without assist.  Please re-consult OT if definitive, documented improvements in cognition impacting ADL are noted.  Antonio Navarro would benefit from a care planned routine that is followed consistently.        White lesions/rash on back and buttocks -Appeared after patient had laying in tube feeding    10/10/20                           10/10/20     10/16/2020     10/20/2020                                     10/20/2020 -Rash has resolved  Situational depression -Started Celexa 2/16-increased to 20 mg 2/18 and given increase in depression likely related to significant other's diagnosis of breast cancer on 3/25 will increase Celexa to 40  mg  Ischemic cardiomyopathy -Echo this admission EF 40% -Given difficulty taking oral medications consistently have opted to transition to once daily dosing as follows: Change Coreg to Toprol-XL 12.5 daily Discontinue hydralazine Changed 3 times daily Isordil to Imdur 30 mg daily -No ACE inhibitor secondary to underlying kidney disease -Follow-up echocardiogram done March 2022 demonstrates an EF of 40 to 45% with LVH and grade 1 diastolic dysfunction physiology  Nutrition Status: Dysphagia with moderate protein calorie malnutrition Nutrition Problem: Increased nutrient needs Etiology: chronic illness (CKD3b) Signs/Symptoms: estimated needs Interventions: Ensure Enlive (each supplement provides 350kcal and 20 grams of protein),MVI,Magic cup,Prostat,Refer to RD note for recommendations PEG tube discontinued on 3/23  Tolerating D3 diet Continue Ensure protein beverages twice daily between meals;  Estimated body mass index is 25.47 kg/m as calculated from the following:   Height as of this encounter: 6' (1.829 m).   Weight as of this encounter: 85.2 kg.  Hypertension -Was on Norvasc prior to admission -Continue cardiovascular meds as listed above  Acute on chronic kidney disease stage IIIb -Suspect prerenal etiology in the setting of hypoperfusion from ventricular fibrillation arrest -Baseline creatinine between 1.6 and 2-creatinine as of 3/14 stable at 1.33    Other problems: Acute hypernatremia -Continue free water per tube  E. coli UTI -Has completed 5 days of Bactrim  Hyperglycemia without an underlying diagnosis of diabetes mellitus Resolved -Likely related to acute stress -Hemoglobin A1c 4.7   Acute hypoxemic respiratory failure -Resolved -Stable on room air with O2 sats between 97 and 99%  Nonsustained VT -Continue amiodarone and beta-blocker -1/31 LFTs normal  Skin tear right lateral back Wound / Incision (Open or Dehisced) 07/28/20 Skin tear Back  Lateral;Right;Upper (Active)  Date First Assessed/Time First Assessed: 07/28/20 0300   Wound Type: Skin tear  Location: Back  Location Orientation: Lateral;Right;Upper  Present on Admission: No    Assessments 07/28/2020  7:49 PM 11/08/2020  9:00 AM  Dressing Type None None  Drainage Amount None --  Treatment Cleansed --     No Linked orders to display    Data Reviewed: Basic Metabolic Panel: Recent Labs  Lab 11/11/20 0219  NA 140  K 3.5  CL 105  CO2 26  GLUCOSE 102*  BUN 10  CREATININE 1.26*  CALCIUM 9.5   Liver Function Tests: Recent Labs  Lab 11/11/20 0219  AST 19  ALT 34  ALKPHOS 74  BILITOT 1.0  PROT 7.1  ALBUMIN 3.2*   No results for input(s): LIPASE, AMYLASE in the last 168 hours. No results for input(s): AMMONIA in the last 168 hours. CBC: Recent Labs  Lab 11/11/20 0219  WBC 7.5  NEUTROABS 3.1  HGB 12.4*  HCT 38.3*  MCV 94.8  PLT 177   Cardiac Enzymes: No results for input(s): CKTOTAL, CKMB, CKMBINDEX, TROPONINI in the last 168 hours. BNP (last 3 results) No results for input(s): BNP in the last 8760 hours.  ProBNP (last 3 results) No results for input(s): PROBNP in the last 8760 hours.  CBG: Recent Labs  Lab 11/07/20 0605 11/07/20 1217 11/08/20 0013 11/08/20 0607 11/08/20 1204  GLUCAP 116* 133* 116* 117* 124*    No results found for this or any previous visit (from the past 240 hour(s)).   Studies: No results found.  Scheduled Meds: . aspirin  81 mg Oral Daily  . atorvastatin  80 mg Oral Daily  . chlorhexidine gluconate (MEDLINE KIT)  15 mL Mouth Rinse BID  . citalopram  40 mg Oral Daily  . clopidogrel  75 mg Oral Daily  . enoxaparin (LOVENOX) injection  40 mg Subcutaneous Q24H  . feeding supplement  237 mL Oral BID BM  . isosorbide mononitrate  30 mg Oral Daily  . mouth rinse  15 mL Mouth Rinse BID  . metoprolol succinate  12.5 mg Oral Daily  . multivitamin with minerals  1 tablet Oral Daily  . polyvinyl alcohol  1 drop Both  Eyes QID  . QUEtiapine  25 mg Oral QHS  . sodium chloride flush  3 mL Intravenous Q12H   Continuous Infusions:   Active Problems:   Acute ST elevation myocardial infarction (STEMI) (HCC)   Cardiac arrest (HCC)   Elevated blood pressure reading with diagnosis of hypertension   Shock circulatory (HCC)   Encephalopathy   History of ETT   Cardiomyopathy, ischemic   Anoxic brain damage ([place])   Dysphagia   DNR (do not resuscitate) discussion   Gait instability   E. coli UTI   Essential hypertension   Situational depression   Consultants:  Cardiology  Interventional radiology  PCCM  Neurology  Procedures:  Cardiac catheterization 12/4  Continuous EEG 12/4 and overnight EEG with video 12/5  Echocardiogram 12/5  EEG 12/9  Percutaneous PEG tube placement 12/30  Antibiotics: Anti-infectives (From admission, onward)   Start     Dose/Rate Route Frequency Ordered Stop   10/14/20 1000  fluconazole (DIFLUCAN) tablet 100 mg        100 mg Oral Daily 10/14/20 0848 10/18/20 0932   09/13/20 1000  sulfamethoxazole-trimethoprim (BACTRIM) 200-40 MG/5ML suspension 20 mL        20 mL Per Tube Every 12 hours 09/13/20 0734 09/19/20 0926   09/13/20 0745  cefTRIAXone (ROCEPHIN) 1 g in sodium chloride 0.9 % 100 mL IVPB  Status:  Discontinued        1 g 200 mL/hr over 30 Minutes Intravenous Every 24 hours 09/13/20 0658 09/13/20 0734   08/17/20 0000  ceFAZolin (ANCEF) IVPB 2g/100 mL premix        2 g 200 mL/hr over 30 Minutes Intravenous To Radiology 08/15/20 1346 08/17/20 1111   07/22/20 0830  cefTRIAXone (ROCEPHIN) 2 g in sodium chloride 0.9 % 100 mL IVPB        2 g 200 mL/hr over 30 Minutes Intravenous Every 24 hours 07/22/20 0720 07/26/20 0858       Time spent: 20 minutes    Erin Hearing ANP  [facility] 7 am - 330 pm/M-F for direct patient care and secure chat Please refer to [place] for contact information 114 days

## 2020-11-13 NOTE — Progress Notes (Signed)
  Speech Language Pathology Treatment: Dysphagia;Cognitive-Linquistic  Patient Details Name: Antonio Navarro MRN: [id number] DOB: Apr 22, 1961 Today's Date: 11/13/2020 Time: 3299-2426 SLP Time Calculation (min) (ACUTE ONLY): 12 min  Assessment / Plan / Recommendation Clinical Impression  Pt demonstrated prolonged mastication but good oral clearance with PO trials today. He benefited from liquid washes to ensure his oral cavity was completely clear especially with recent upgrade in solids to DYS 3. Recommend continuation of his current diet with full supervision to check for oral holding of solids and to initiate PO intake. Pt also benefited from cueing for repetition to communicate his wants/needs especially when branching up to two-word phrases. SLP will continue to follow for dysphagia and cog-ling tx.    HPI HPI: Pt is a 60 yo male s/p arrest with downtime 9 minutes. Initial rhythm was V. fib, shocked and received 6 rounds of epi before ROSC achieved.  Total CPR time was 30 minutes. ETT 12/4-12/13. MRI 12/7 suggestive of a widespread anoxic injury. PMH includes kidney stones and HTN.      SLP Plan  Continue with current plan of care       Recommendations  Diet recommendations: Dysphagia 3 (mechanical soft);Thin liquid Liquids provided via: Cup;Straw Medication Administration: Crushed with puree Supervision: Staff to assist with self feeding;Full supervision/cueing for compensatory strategies Compensations: Slow rate;Small sips/bites;Minimize environmental distractions Postural Changes and/or Swallow Maneuvers: Seated upright 90 degrees                Oral Care Recommendations: Oral care BID Follow up Recommendations: Skilled Nursing facility SLP Visit Diagnosis: Dysphagia, unspecified (R13.10) Plan: Continue with current plan of care       GO                Jeanine Luz., SLP Student 11/13/2020, 11:43 AM

## 2020-11-14 NOTE — Progress Notes (Signed)
[facility] PROGRESS NOTE  Antonio Navarro:[id number] DOB: 05/25/1961 DOA: 07/22/2020 PCP: Patient, No Pcp Per       Status: Remains inpatient appropriate because:Altered mental status and Unsafe d/c plan   Dispo: The patient is from: Home              Anticipated d/c is to: SNF              Anticipated d/c date is: > 3 days              Patient currently is medically stable to d/c.  Barriers to discharge: Medicaid and disability pending. No SNF bed offers.  No further nocturnal agitation or wandering behavior so hopeful will be eligible for SNF bed offer.  Patient able to ambulate 300 feet so hopefully he can eventually discharge home with family as long as they can provide appropriate 24/7 monitoring.  On 2/28 TOC contacted at [place] [facility] [place]   Code Status: Full Family Communication: 3/22 updated Sister Antonio Navarro by telephone DVT prophylaxis: sq heparin Vaccination status: Completed both doses of Pfizer Covid vaccination on 12/14/2019; Protection booster ordered to be given on 3/3   Foley catheter: No  HPI: 60 year old male history of hypertension prescribed Norvasc prior to admission, chronic back pain, prior renal calculi who experienced an out of hospital cardiac arrest with downtime of 9 minutes.  Initial rhythm was ventricular fibrillation.  He was defibrillated deceived 6 doses of epi before ROSC.  Subsequent work-up revealed STEMI.  He underwent cardiac catheterization with a DES placed to the circumflex.  Cardiology following and has placed patient on Brilinta and aspirin and is also being treated with beta-blocker and statins.  From a neurological standpoint he clinically has anoxic brain injury.  During this hospitalization he experienced AKI and was treated with fluids and correction of hypoperfusion from cardiac arrest.  Echo performed this admission revealed ischemic cardiomyopathy with an EF of 40%  In addition to above patient has been unable to pass  swallowing evaluations and subsequently has undergone percutaneous PEG tube placement on 12/30.  Patient is awake and moving all extremities spontaneously but does not follow commands and does not verbalize palliative care has met with patient and family and has emphasized poor prognosis and will continue to meet with the family during the hospitalization.  Current plan is to transition to SNF bed once available.  Expect need for lifelong 24/7 care for all ADLs.  Subjective: Awakened.  Cooperative with exam.  Denied any oral or dental pain.  When discussed his recent issues regarding spitting food out and wiping food off of his tongue he confirmed that it was because the food did not taste good to him.  He smiled and laughed appropriately while we are joking with him in the room.  Objective: Vitals:   11/13/20 2100 11/14/20 0437  BP: (!) 183/87 (!) 114/50  Pulse: 87   Resp: 20 12  Temp: 98.9 F (37.2 C) 98.1 F (36.7 C)  SpO2: 92% 99%    Intake/Output Summary (Last 24 hours) at 11/14/2020 0804 Last data filed at 11/13/2020 1700 Gross per 24 hour  Intake 360 ml  Output 625 ml  Net -265 ml   Filed Weights   11/12/20 0509 11/13/20 0018 11/14/20 0400  Weight: 84 kg 85.2 kg 85.7 kg    Exam: Constitutional: Awake, no acute distress ENT: Oral cavity unremarkable.  Patient has several dental caries but no foul orders from mouth no edema or lesions that  would be concerning for infection or abscess. Heart: Normal heart sounds, pulse regular, no peripheral edema, no JVD Respiratory: Lungs are clear and he is stable on room air without evidence of dyspnea on exertion when ambulating Abdomen: LBM 3/19, bowel sounds are present.  Poor oral intake.  Neurologic: CN 2-12 intact, still has significant issues with dysarthria and he was encouraged that if he is having trouble communicating to use a picture board or write down responses.  He nodded yes when I asked him if he felt he could write out his  responses.  Able to ambulate independently. Psychiatric: Alert and oriented x3 although has difficulty responding verbally due to dysarthria.  Pleasant affect.  Stated that [place] would beat [facility] when asked    Assessment/Plan: Acute problems: Out of hospital cardiac arrest secondary to STEMI -ROSC and subsequent cardiac catheterization revealed dz in circumflex requiring DES -Continue beta-blocker, statin, Plavix, and Imdur  Significant anoxic brain injury secondary to cardiac arrest/deconditioning/gait instability -Consistently participating with PT and OT.  SLP has cleared for dysphagia 2 with thin liquids -Declined by CIR since family unable to provide 24/7 care.  Plan is for SNF -3/25 changed Seroquel to bedtime dosing only 25 mg -3/25 discontinued Symmetrel  **PT NOTES FROM 3/28**  Treatment limited today as pt internally distracted and seemingly uncomfortable. Once pt was assisted up to edge of bed, pt spitting out pocketed food. Following, pt with increased saliva production and consistently spitting and rubbing tongue with a paper towel throughout rest of session. Walked pt to sink to brush his teeth and rinse, though did not seem to alleviate discomfort. Pt was able to have a bowel movement when prompted to sit down on toilet.  RN/SLP notified of above. Will continue to follow.    SLP notes 3/28:   Pt demonstrated prolonged mastication but good oral clearance with PO trials today. He benefited from liquid washes to ensure his oral cavity was completely clear especially with recent upgrade in solids to DYS 3. Recommend continuation of his current diet with full supervision to check for oral holding of solids and to initiate PO intake. Pt also benefited from cueing for repetition to communicate his wants/needs especially when branching up to two-word phrases. SLP will continue to follow for dysphagia and cog-ling tx.    OT notes 3/24:  Patient admitted 110 days ago for the above  diagnosis.  During this time Antonio Navarro has made progress with his mobility, and certain aspects of his self care.  Unfortunately, OT believes he is most likely at his new cognitive baseline, and it is not expected, he will progress much beyond his current level for bathing, dressing, toileting and self feeding.  Over the past treatments, he has shown inconsistency in physical performance of ADL mainly due to continued balance deficits, and cognitive deficits.  Dewel will need 24 hour minimal assist, supervision, and cueing for most, if not all, functional tasks.  He needs cues to initiate tasks, and cues to sequence through tasks to complete them, and this is not anticipated to improve.  This OT had conversations with NT and RN that are familiar with [name].  We discussed trying to establish a routine for [facility]: consistent awake times, times in the chair, rest times, ADL times, and a toileting schedule.  Both NT and RN verbalized they try to accomplish this, but as described above, inconsistencies with behavior, cooperation and cognitive capacity are the barriers.  OT continues to recommend 24 hour assist as needed, SNF  is an option, but home with family and a toileting plan, could be considered if 24/7 is possible.  Of note: patient's diet was downgraded to full liquid 3/23 due to pocketing of solids, whereas earlier in the week he ate meat and potatoes with utensils and supervision without assist.  Please re-consult OT if definitive, documented improvements in cognition impacting ADL are noted.  Yvon would benefit from a care planned routine that is followed consistently.        White lesions/rash on back and buttocks -Appeared after patient had laying in tube feeding    10/10/20                           10/10/20     10/16/2020     10/20/2020                                     10/20/2020 -Rash has resolved  Situational depression -Started Celexa 2/16-increased to 20 mg 2/18 and given increase in  depression likely related to significant other's diagnosis of breast cancer on 3/25 will increase Celexa to 40 mg  Ischemic cardiomyopathy -Echo this admission EF 40% -Given difficulty taking oral medications consistently have opted to transition to once daily dosing as follows: Change Coreg to Toprol-XL 12.5 daily Discontinue hydralazine Changed 3 times daily Isordil to Imdur 30 mg daily -No ACE inhibitor secondary to underlying kidney disease -Follow-up echocardiogram done March 2022 demonstrates an EF of 40 to 45% with LVH and grade 1 diastolic dysfunction physiology  Nutrition Status: Dysphagia with moderate protein calorie malnutrition Nutrition Problem: Increased nutrient needs Etiology: chronic illness (CKD3b) Signs/Symptoms: estimated needs Interventions: Ensure Enlive (each supplement provides 350kcal and 20 grams of protein),MVI,Magic cup,Prostat,Refer to RD note for recommendations PEG tube discontinued on 3/23  Tolerating D3 diet Continue Ensure protein beverages twice daily between meals;  Estimated body mass index is 25.62 kg/m as calculated from the following:   Height as of this encounter: 6' (1.829 m).   Weight as of this encounter: 85.7 kg.  Hypertension -Was on Norvasc prior to admission -Continue cardiovascular meds as listed above  Acute on chronic kidney disease stage IIIb -Suspect prerenal etiology in the setting of hypoperfusion from ventricular fibrillation arrest -Baseline creatinine between 1.6 and 2-creatinine as of 3/14 stable at 1.33    Other problems: Acute hypernatremia -Continue free water per tube  E. coli UTI -Has completed 5 days of Bactrim  Hyperglycemia without an underlying diagnosis of diabetes mellitus Resolved -Likely related to acute stress -Hemoglobin A1c 4.7   Acute hypoxemic respiratory failure -Resolved -Stable on room air with O2 sats between 97 and 99%  Nonsustained VT -Continue amiodarone and beta-blocker -3/26 LFTs  normal  Skin tear right lateral back Wound / Incision (Open or Dehisced) 07/28/20 Skin tear Back Lateral;Right;Upper (Active)  Date First Assessed/Time First Assessed: 07/28/20 0300   Wound Type: Skin tear  Location: Back  Location Orientation: Lateral;Right;Upper  Present on Admission: No    Assessments 07/28/2020  7:49 PM 11/08/2020  9:00 AM  Dressing Type None None  Drainage Amount None --  Treatment Cleansed --     No Linked orders to display    Data Reviewed: Basic Metabolic Panel: Recent Labs  Lab 11/11/20 0219  NA 140  K 3.5  CL 105  CO2 26  GLUCOSE 102*  BUN 10  CREATININE 1.26*  CALCIUM 9.5   Liver Function Tests: Recent Labs  Lab 11/11/20 0219  AST 19  ALT 34  ALKPHOS 74  BILITOT 1.0  PROT 7.1  ALBUMIN 3.2*   No results for input(s): LIPASE, AMYLASE in the last 168 hours. No results for input(s): AMMONIA in the last 168 hours. CBC: Recent Labs  Lab 11/11/20 0219  WBC 7.5  NEUTROABS 3.1  HGB 12.4*  HCT 38.3*  MCV 94.8  PLT 177   Cardiac Enzymes: No results for input(s): CKTOTAL, CKMB, CKMBINDEX, TROPONINI in the last 168 hours. BNP (last 3 results) No results for input(s): BNP in the last 8760 hours.  ProBNP (last 3 results) No results for input(s): PROBNP in the last 8760 hours.  CBG: Recent Labs  Lab 11/07/20 1217 11/08/20 0013 11/08/20 0607 11/08/20 1204  GLUCAP 133* 116* 117* 124*    No results found for this or any previous visit (from the past 240 hour(s)).   Studies: No results found.  Scheduled Meds: . aspirin  81 mg Oral Daily  . atorvastatin  80 mg Oral Daily  . chlorhexidine gluconate (MEDLINE KIT)  15 mL Mouth Rinse BID  . citalopram  40 mg Oral Daily  . clopidogrel  75 mg Oral Daily  . enoxaparin (LOVENOX) injection  40 mg Subcutaneous Q24H  . feeding supplement  237 mL Oral BID BM  . isosorbide mononitrate  30 mg Oral Daily  . mouth rinse  15 mL Mouth Rinse BID  . metoprolol succinate  12.5 mg Oral Daily  .  multivitamin with minerals  1 tablet Oral Daily  . polyvinyl alcohol  1 drop Both Eyes QID  . QUEtiapine  25 mg Oral QHS  . sodium chloride flush  3 mL Intravenous Q12H   Continuous Infusions:   Active Problems:   Acute ST elevation myocardial infarction (STEMI) (HCC)   Cardiac arrest (HCC)   Elevated blood pressure reading with diagnosis of hypertension   Shock circulatory (HCC)   Encephalopathy   History of ETT   Cardiomyopathy, ischemic   Anoxic brain damage ([place])   Dysphagia   DNR (do not resuscitate) discussion   Gait instability   E. coli UTI   Essential hypertension   Situational depression   Consultants:  Cardiology  Interventional radiology  PCCM  Neurology  Procedures:  Cardiac catheterization 12/4  Continuous EEG 12/4 and overnight EEG with video 12/5  Echocardiogram 12/5  EEG 12/9  Percutaneous PEG tube placement 12/30  Antibiotics: Anti-infectives (From admission, onward)   Start     Dose/Rate Route Frequency Ordered Stop   10/14/20 1000  fluconazole (DIFLUCAN) tablet 100 mg        100 mg Oral Daily 10/14/20 0848 10/18/20 0932   09/13/20 1000  sulfamethoxazole-trimethoprim (BACTRIM) 200-40 MG/5ML suspension 20 mL        20 mL Per Tube Every 12 hours 09/13/20 0734 09/19/20 0926   09/13/20 0745  cefTRIAXone (ROCEPHIN) 1 g in sodium chloride 0.9 % 100 mL IVPB  Status:  Discontinued        1 g 200 mL/hr over 30 Minutes Intravenous Every 24 hours 09/13/20 0658 09/13/20 0734   08/17/20 0000  ceFAZolin (ANCEF) IVPB 2g/100 mL premix        2 g 200 mL/hr over 30 Minutes Intravenous To Radiology 08/15/20 1346 08/17/20 1111   07/22/20 0830  cefTRIAXone (ROCEPHIN) 2 g in sodium chloride 0.9 % 100 mL IVPB        2 g 200 mL/hr over  30 Minutes Intravenous Every 24 hours 07/22/20 0720 07/26/20 0858       Time spent: 20 minutes    Erin Hearing ANP  [facility] 7 am - 330 pm/M-F for direct patient care and secure chat Please refer to Amion  for contact information 115 days

## 2020-11-14 NOTE — Progress Notes (Signed)
CSW spoke with Jolene Schimke at [facility] - agreement for [place] contract still under review by facility administration.  [name], MSW, LCSW Transitions of Care  Clinical Social Worker II [phone]

## 2020-11-14 NOTE — Progress Notes (Signed)
Nutrition Follow-up  DOCUMENTATION CODES:   Not applicable  INTERVENTION:   -Continue MVI with minerals daily -Continue Ensure Enlive po BID, each supplement provides 350 kcal and 20 grams of protein -Continue Magic cup TID with meals, each supplement provides 290 kcal and 9 grams of protein -Continue feeding assistance with meals -Hormel Shake BID with meals, each supplement provides 520 kcals and 22 grams protein  NUTRITION DIAGNOSIS:   Increased nutrient needs related to chronic illness (CKD3b) as evidenced by estimated needs.  Ongoing  GOAL:   Patient will meet greater than or equal to 90% of their needs  Progressing   MONITOR:   PO intake,Supplement acceptance,Skin,I & O's,Labs,Weight trends  REASON FOR ASSESSMENT:   Consult,Ventilator Enteral/tube feeding initiation and management  ASSESSMENT:   Patient with PMH significant for HTN and kidney stones. Presents this admission s/p cardiac arrest. Recent onset of CKD3b, dysphagia.  12/13- NGT d/c 12/15- cortrak placed, tip of tube in the stomach 12/17- s/p BSE- recommend continue NPO 12/19- pt pulled cortrak tube, replaced- placement verified by x-ray (stomach) 12/20- s/p BSE- pt refusing PO trials 12/30- cortrak removed, PEG placed 1/5- Advanced to thin liquid diet 1/6- Advanced to full liquid diet 1/11- Advanced to dysphagia 2 diet 1/14- transitioned to nocturnal feedings by MD 1/18- TF d/c by MD 1/24- calorie count completed- pt consuming about 50% of needs PO; nocturnal feedings resumed 3/10- TF d/c  3/23- g-tube removed 3/24- diet downgraded to full liquids per SLP 3/25- s/p BSE- advanced to dysphagia 3 diet with thin liquids  Reviewed I/O's: -265 ml x 24 hours and +2.8 L since 10/31/20  UOP: 625 ml x 24 hours  Per SLP notes, pt tolerating diet upgrade well, but with prolonged mastication. Pt routine skips or eats very little for breakfast. Noted He consumes 75-100% of meals for lunch and dinner. Pt  is also consuming Ensure Enlive supplements.   Wt has been stable over the past month.   11/11/20 Breakfast: 0% completed Lunch: 761 kcals, 26 grams protein Dinner: 649 kcals, 26 grams protein Supplements: refused supplements  Total intake: 1410 kcal (61% of minimum estimated needs)  52 grams protein (45% of minimum estimated needs)  11/12/20 Breakfast: 0% completed Lunch: 383 kcals, 20 grams protein Dinner: 625 kcals, 18 grams protein Supplements: 2 Ensure Enlive supplements (700 kclas, 40 grams protein)  Total intake: 1708 kcal (74% of minimum estimated needs)  78 grams protein (68% of minimum estimated needs)  11/13/20 Breakfast: 128 kcals, 2 grams protein Lunch: 506 kcals, 21 grams protein Dinner: nothing documented Supplements: 2 Ensure Enlive supplements (700 kcals, 40 grams protein)  Total intake: 1334 kcal (58% of minimum estimated needs)  63 grams protein (55% of minimum estimated needs)  Average Total intake: 1484 kcal (65% of minimum estimated needs)  64 grams protein (56% of minimum estimated needs)  Pt medically stable for discharge. Pt continues to await placement.   Labs reviewed: CBGS: [phone] (inpatient orders for glycemic control are none).   Diet Order:   Diet Order            DIET DYS 3 Room service appropriate? Yes with Assist; Fluid consistency: Thin  Diet effective now                 EDUCATION NEEDS:   No education needs have been identified at this time  Skin:  Skin Assessment: Skin Integrity Issues: Skin Integrity Issues:: Other (Comment) Other: Skin tear R upper back, L buttocks rash  Last BM:  11/11/20  Height:   Ht Readings from Last 1 Encounters:  09/03/20 6' (1.829 m)    Weight:   Wt Readings from Last 1 Encounters:  11/14/20 85.7 kg    Ideal Body Weight:  80.9 kg  BMI:  Body mass index is 25.62 kg/m.  Estimated Nutritional Needs:   Kcal:  2300-2500 kcal  Protein:  115-130 grams  Fluid:  >/= 2  L/day    Loistine Chance, RD, LDN, [place] Registered Dietitian II Certified Diabetes Care and Education Specialist Please refer to [facility] for RD and/or RD on-call/weekend/after hours pager

## 2020-11-15 MED ORDER — HYDRALAZINE HCL 20 MG/ML IJ SOLN
5.0000 mg | INTRAMUSCULAR | Status: DC | PRN
  Administered 2020-11-15: 5 mg via INTRAVENOUS
  Filled 2020-11-15: qty 1

## 2020-11-15 NOTE — Plan of Care (Signed)
  Problem: Clinical Measurements: Goal: Will remain free from infection Outcome: Progressing Goal: Diagnostic test results will improve Outcome: Progressing   Problem: Activity: Goal: Risk for activity intolerance will decrease Outcome: Progressing   Problem: Safety: Goal: Ability to remain free from injury will improve Outcome: Progressing   Problem: Skin Integrity: Goal: Risk for impaired skin integrity will decrease Outcome: Progressing   Problem: Neurologic: Goal: Promote progressive neurologic recovery Outcome: Progressing   Problem: Skin Integrity: Goal: Risk for impaired skin integrity will be minimized. Outcome: Progressing

## 2020-11-15 NOTE — Progress Notes (Signed)
Physical Therapy Treatment Patient Details Name: MANASSEH PITTSLEY MRN: [id number] DOB: [birth year] Today's Date: 11/15/2020    History of Present Illness Pt is a 60 y.o. male admitted 07/22/20 post-cardiac arrest with STEMI; found to be in V fib with resucitation by EMS in the field, approximately 30-min of CPR. ETT 12/4-12/13. MRI 12/7 suggestive of a widespread anoxic injury. S/p PEG tube 12/30. PEG removed 3/23. PMH includes tobacco abuse, HTN, chronic back pain.  PEG tube removed.    PT Comments    Pt tolerates treatment well but continues to require significant motivation from therapist and staff to progress mobility. Pt with limited awareness of deficits and safety. Pt continues to require cues to facilitate anterior trunk lean during transfers. Pt remains very easily distracted during session and demonstrates poor retention between sessions which is a limiting factor in progress. Pt will benefit from continued acute PT POC to reduce falls risk. PT continues to recommend SNF placement.   Follow Up Recommendations  SNF;Supervision/Assistance - 24 hour     Equipment Recommendations  Rolling walker with 5" wheels;3in1 (PT);Wheelchair (measurements PT);Wheelchair cushion (measurements PT)    Recommendations for Other Services       Precautions / Restrictions Precautions Precautions: Fall Restrictions Weight Bearing Restrictions: No    Mobility  Bed Mobility Overal bed mobility: Needs Assistance Bed Mobility: Supine to Sit     Supine to sit: Mod assist     General bed mobility comments: pt requires assistance to move LEs off edge of bed. Pt appears to have difficulty coordinating LE extension off edge of bed when pushing trunk up into sitting. LEs often flex back onto bed when pt attempts to push trunk up    Transfers Overall transfer level: Needs assistance Equipment used: Rolling walker (2 wheeled) Transfers: Sit to/from Stand Sit to Stand: Mod assist;Min assist          General transfer comment: PT provides visual/verbal/tactile cues to aide in anterior trunk lean to facilitate transfers  Ambulation/Gait Ambulation/Gait assistance: Min assist Gait Distance (Feet): 130 Feet (additional trial of 40') Assistive device: Rolling walker (2 wheeled) Gait Pattern/deviations: Step-to pattern;Step-through pattern Gait velocity: reduced Gait velocity interpretation: <1.31 ft/sec, indicative of household ambulator General Gait Details: pt with slowed step-through gait, drifts left/right in hall and is easily distracted, requires PT cues to bring eyes up and maintain straight path   Stairs             Wheelchair Mobility    Modified Rankin (Stroke Patients Only)       Balance Overall balance assessment: Needs assistance Sitting-balance support: No upper extremity supported;Feet supported Sitting balance-Leahy Scale: Fair     Standing balance support: Bilateral upper extremity supported Standing balance-Leahy Scale: Poor Standing balance comment: reliant on UE support of RW                            Cognition Arousal/Alertness: Awake/alert Behavior During Therapy: WFL for tasks assessed/performed Overall Cognitive Status: Impaired/Different from baseline Area of Impairment: Attention;Memory;Following commands;Safety/judgement;Awareness;Problem solving               Rancho Levels of Cognitive Functioning Ranch[place] Amigos Scales of Cognitive Functioning: Confused/appropriate   Current Attention Level: Sustained Memory: Decreased recall of precautions;Decreased short-term memory Following Commands: Follows one step commands with increased time Safety/Judgement: Decreased awareness of safety;Decreased awareness of deficits Awareness: Intellectual Problem Solving: Slow processing;Decreased initiation;Difficulty sequencing;Requires verbal cues;Requires tactile cues  Exercises      General Comments General comments  (skin integrity, edema, etc.): VSS on RA      Pertinent Vitals/Pain Pain Assessment: Faces Faces Pain Scale: No hurt    Home Living                      Prior Function            PT Goals (current goals can now be found in the care plan section) Acute Rehab PT Goals Patient Stated Goal: pt fiance would like him to get intensive rehab Progress towards PT goals: Progressing toward goals    Frequency    Min 3X/week      PT Plan Current plan remains appropriate    Co-evaluation              AM-PAC PT "6 Clicks" Mobility   Outcome Measure  Help needed turning from your back to your side while in a flat bed without using bedrails?: A Little Help needed moving from lying on your back to sitting on the side of a flat bed without using bedrails?: A Lot Help needed moving to and from a bed to a chair (including a wheelchair)?: A Little Help needed standing up from a chair using your arms (e.g., wheelchair or bedside chair)?: A Lot Help needed to walk in hospital room?: A Little Help needed climbing 3-5 steps with a railing? : A Lot 6 Click Score: 15    End of Session Equipment Utilized During Treatment: Gait belt Activity Tolerance: Patient tolerated treatment well Patient left: in chair;with call bell/phone within reach;with chair alarm set Nurse Communication: Mobility status PT Visit Diagnosis: Other abnormalities of gait and mobility (R26.89);Difficulty in walking, not elsewhere classified (R26.2)     Time: 8127-5170 PT Time Calculation (min) (ACUTE ONLY): 35 min  Charges:  $Gait Training: 8-22 mins $Therapeutic Activity: 8-22 mins                     Zenaida Niece, PT, DPT Acute Rehabilitation Pager: [phone]    Zenaida Niece 11/15/2020, 4:46 PM

## 2020-11-15 NOTE — Progress Notes (Signed)
[facility] PROGRESS NOTE  Antonio Navarro SWH:[id number] DOB: [birth year] DOA: 07/22/2020 PCP: Patient, No Pcp Per (Inactive)       Status: Remains inpatient appropriate because:Altered mental status and Unsafe d/c plan   Dispo: The patient is from: Home              Anticipated d/c is to: SNF              Anticipated d/c date is: > 3 days              Patient currently is medically stable to d/c.  Barriers to discharge: Medicaid and disability pending. No SNF bed offers.  No further nocturnal agitation or wandering behavior so hopeful will be eligible for SNF bed offer.  Patient able to ambulate 300 feet so hopefully he can eventually discharge home with family as long as they can provide appropriate 24/7 monitoring.  On 2/28 TOC contacted at [facility]   Code Status: Full Family Communication: 3/22 updated Sister [name] by telephone DVT prophylaxis: sq heparin Vaccination status: Completed both doses of Pfizer Covid vaccination on 12/14/2019; [place] booster ordered to be given on 3/3   Foley catheter: No  HPI: 60 year old male history of hypertension prescribed Norvasc prior to admission, chronic back pain, prior renal calculi who experienced an out of hospital cardiac arrest with downtime of 9 minutes.  Initial rhythm was ventricular fibrillation.  He was defibrillated deceived 6 doses of epi before ROSC.  Subsequent work-up revealed STEMI.  He underwent cardiac catheterization with a DES placed to the circumflex.  Cardiology following and has placed patient on Brilinta and aspirin and is also being treated with beta-blocker and statins.  From a neurological standpoint he clinically has anoxic brain injury.  During this hospitalization he experienced AKI and was treated with fluids and correction of hypoperfusion from cardiac arrest.  Echo performed this admission revealed ischemic cardiomyopathy with an EF of 40%  In addition to above patient has been unable  to pass swallowing evaluations and subsequently has undergone percutaneous PEG tube placement on 12/30.  Patient is awake and moving all extremities spontaneously but does not follow commands and does not verbalize palliative care has met with patient and family and has emphasized poor prognosis and will continue to meet with the family during the hospitalization.  Current plan is to transition to SNF bed once available.  Expect need for lifelong 24/7 care for all ADLs.  Subjective: Sleeping soundly so did not awaken  Objective: Vitals:   11/15/20 0000 11/15/20 0500  BP: (!) 138/100 (!) 154/86  Pulse: 78 65  Resp: 16   Temp: 98.6 F (37 C) 97.9 F (36.6 C)  SpO2: 100% 99%    Intake/Output Summary (Last 24 hours) at 11/15/2020 0748 Last data filed at 11/15/2020 0430 Gross per 24 hour  Intake 540 ml  Output 825 ml  Net -285 ml   Filed Weights   11/13/20 0018 11/14/20 0400 11/15/20 0238  Weight: 85.2 kg 85.7 kg 84.3 kg    Exam: Constitutional: Sleeping so did not awaken.  In no acute distress Heart: S1-S2, pulse regular, no peripheral edema Respiratory: Anterior lung sounds clear.  Room air Abdomen: LBM 3/19, continues with inconsistent oral intake based on food preferences.  Abdominal exam unremarkable with normoactive bowel sounds Neurologic: CN 2-12 intact, still has significant issues with dysarthria Able to ambulate independently. Psychiatric: Sleeping but at baseline oriented definitely to name and appears to be oriented x3 otherwise.  Orientation assessment limited by patient's dysarthria.    Assessment/Plan: Acute problems: Out of hospital cardiac arrest secondary to STEMI -ROSC and subsequent cardiac catheterization revealed dz in circumflex requiring DES -Continue beta-blocker, statin, Plavix, and Imdur  Significant anoxic brain injury secondary to cardiac arrest/deconditioning/gait instability -Consistently participating with PT and OT.  SLP has cleared for  dysphagia 2 with thin liquids -Declined by CIR since family unable to provide 24/7 care.  Plan is for SNF -3/25 changed Seroquel to bedtime dosing only 25 mg -3/25 discontinued Symmetrel  **PT NOTES FROM 3/28**  Treatment limited today as pt internally distracted and seemingly uncomfortable. Once pt was assisted up to edge of bed, pt spitting out pocketed food. Following, pt with increased saliva production and consistently spitting and rubbing tongue with a paper towel throughout rest of session. Walked pt to sink to brush his teeth and rinse, though did not seem to alleviate discomfort. Pt was able to have a bowel movement when prompted to sit down on toilet.  RN/SLP notified of above. Will continue to follow.    SLP notes 3/28:   Pt demonstrated prolonged mastication but good oral clearance with PO trials today. He benefited from liquid washes to ensure his oral cavity was completely clear especially with recent upgrade in solids to DYS 3. Recommend continuation of his current diet with full supervision to check for oral holding of solids and to initiate PO intake. Pt also benefited from cueing for repetition to communicate his wants/needs especially when branching up to two-word phrases. SLP will continue to follow for dysphagia and cog-ling tx.    OT notes 3/24:  Patient admitted 110 days ago for the above diagnosis.  During this time Treyce has made progress with his mobility, and certain aspects of his self care.  Unfortunately, OT believes he is most likely at his new cognitive baseline, and it is not expected, he will progress much beyond his current level for bathing, dressing, toileting and self feeding.  Over the past treatments, he has shown inconsistency in physical performance of ADL mainly due to continued balance deficits, and cognitive deficits.  Jaqualyn will need 24 hour minimal assist, supervision, and cueing for most, if not all, functional tasks.  He needs cues to initiate tasks,  and cues to sequence through tasks to complete them, and this is not anticipated to improve.  This OT had conversations with NT and RN that are familiar with [name].  We discussed trying to establish a routine for [facility]: consistent awake times, times in the chair, rest times, ADL times, and a toileting schedule.  Both NT and RN verbalized they try to accomplish this, but as described above, inconsistencies with behavior, cooperation and cognitive capacity are the barriers.  OT continues to recommend 24 hour assist as needed, SNF is an option, but home with family and a toileting plan, could be considered if 24/7 is possible.  Of note: patient's diet was downgraded to full liquid 3/23 due to pocketing of solids, whereas earlier in the week he ate meat and potatoes with utensils and supervision without assist.  Please re-consult OT if definitive, documented improvements in cognition impacting ADL are noted.  Oluwadarasimi would benefit from a care planned routine that is followed consistently.        White lesions/rash on back and buttocks -Appeared after patient had laying in tube feeding    10/10/20  10/10/20     10/16/2020     10/20/2020                                     10/20/2020 -Rash has resolved  Situational depression -Started Celexa 2/16-increased to 20 mg 2/18 and given increase in depression likely related to significant other's diagnosis of breast cancer on 3/25 will increase Celexa to 40 mg  Ischemic cardiomyopathy -Echo this admission EF 40% -Given difficulty taking oral medications consistently have opted to transition to once daily dosing as follows: Change Coreg to Toprol-XL 12.5 daily Discontinue hydralazine Changed 3 times daily Isordil to Imdur 30 mg daily -No ACE inhibitor secondary to underlying kidney disease -Follow-up echocardiogram done March 2022 demonstrates an EF of 40 to 45% with LVH and grade 1 diastolic dysfunction physiology  Nutrition Status:  Dysphagia with moderate protein calorie malnutrition Nutrition Problem: Increased nutrient needs Etiology: chronic illness (CKD3b) Signs/Symptoms: estimated needs Interventions: Ensure Enlive (each supplement provides 350kcal and 20 grams of protein),MVI,Magic cup,Prostat,Refer to RD note for recommendations PEG tube discontinued on 3/23  Tolerating D3 diet Continue Ensure protein beverages twice daily between meals;  Estimated body mass index is 25.21 kg/m as calculated from the following:   Height as of this encounter: 6' (1.829 m).   Weight as of this encounter: 84.3 kg.  Hypertension -Was on Norvasc prior to admission -Continue cardiovascular meds as listed above  Acute on chronic kidney disease stage IIIb -Suspect prerenal etiology in the setting of hypoperfusion from ventricular fibrillation arrest -Baseline creatinine between 1.6 and 2-creatinine as of 3/14 stable at 1.33    Other problems: Acute hypernatremia -Continue free water per tube  E. coli UTI -Has completed 5 days of Bactrim  Hyperglycemia without an underlying diagnosis of diabetes mellitus Resolved -Likely related to acute stress -Hemoglobin A1c 4.7   Acute hypoxemic respiratory failure -Resolved -Stable on room air with O2 sats between 97 and 99%  Nonsustained VT -Continue amiodarone and beta-blocker -3/26 LFTs normal  Skin tear right lateral back Wound / Incision (Open or Dehisced) 07/28/20 Skin tear Back Lateral;Right;Upper (Active)  Date First Assessed/Time First Assessed: 07/28/20 0300   Wound Type: Skin tear  Location: Back  Location Orientation: Lateral;Right;Upper  Present on Admission: No    Assessments 07/28/2020  7:49 PM 11/08/2020  9:00 AM  Dressing Type None None  Drainage Amount None --  Treatment Cleansed --     No Linked orders to display    Data Reviewed: Basic Metabolic Panel: Recent Labs  Lab 11/11/20 0219  NA 140  K 3.5  CL 105  CO2 26  GLUCOSE 102*  BUN 10   CREATININE 1.26*  CALCIUM 9.5   Liver Function Tests: Recent Labs  Lab 11/11/20 0219  AST 19  ALT 34  ALKPHOS 74  BILITOT 1.0  PROT 7.1  ALBUMIN 3.2*   No results for input(s): LIPASE, AMYLASE in the last 168 hours. No results for input(s): AMMONIA in the last 168 hours. CBC: Recent Labs  Lab 11/11/20 0219  WBC 7.5  NEUTROABS 3.1  HGB 12.4*  HCT 38.3*  MCV 94.8  PLT 177   Cardiac Enzymes: No results for input(s): CKTOTAL, CKMB, CKMBINDEX, TROPONINI in the last 168 hours. BNP (last 3 results) No results for input(s): BNP in the last 8760 hours.  ProBNP (last 3 results) No results for input(s): PROBNP in the last 8760 hours.  CBG: Recent  Labs  Lab 11/08/20 1204  GLUCAP 124*    No results found for this or any previous visit (from the past 240 hour(s)).   Studies: No results found.  Scheduled Meds: . aspirin  81 mg Oral Daily  . atorvastatin  80 mg Oral Daily  . chlorhexidine gluconate (MEDLINE KIT)  15 mL Mouth Rinse BID  . citalopram  40 mg Oral Daily  . clopidogrel  75 mg Oral Daily  . enoxaparin (LOVENOX) injection  40 mg Subcutaneous Q24H  . feeding supplement  237 mL Oral BID BM  . isosorbide mononitrate  30 mg Oral Daily  . mouth rinse  15 mL Mouth Rinse BID  . metoprolol succinate  12.5 mg Oral Daily  . multivitamin with minerals  1 tablet Oral Daily  . polyvinyl alcohol  1 drop Both Eyes QID  . QUEtiapine  25 mg Oral QHS  . sodium chloride flush  3 mL Intravenous Q12H   Continuous Infusions:   Active Problems:   Acute ST elevation myocardial infarction (STEMI) (HCC)   Cardiac arrest (HCC)   Elevated blood pressure reading with diagnosis of hypertension   Shock circulatory (HCC)   Encephalopathy   History of ETT   Cardiomyopathy, ischemic   Anoxic brain damage ([place])   Dysphagia   DNR (do not resuscitate) discussion   Gait instability   E. coli UTI   Essential hypertension   Situational  depression   Consultants:  Cardiology  Interventional radiology  PCCM  Neurology  Procedures:  Cardiac catheterization 12/4  Continuous EEG 12/4 and overnight EEG with video 12/5  Echocardiogram 12/5  EEG 12/9  Percutaneous PEG tube placement 12/30  Antibiotics: Anti-infectives (From admission, onward)   Start     Dose/Rate Route Frequency Ordered Stop   10/14/20 1000  fluconazole (DIFLUCAN) tablet 100 mg        100 mg Oral Daily 10/14/20 0848 10/18/20 0932   09/13/20 1000  sulfamethoxazole-trimethoprim (BACTRIM) 200-40 MG/5ML suspension 20 mL        20 mL Per Tube Every 12 hours 09/13/20 0734 09/19/20 0926   09/13/20 0745  cefTRIAXone (ROCEPHIN) 1 g in sodium chloride 0.9 % 100 mL IVPB  Status:  Discontinued        1 g 200 mL/hr over 30 Minutes Intravenous Every 24 hours 09/13/20 0658 09/13/20 0734   08/17/20 0000  ceFAZolin (ANCEF) IVPB 2g/100 mL premix        2 g 200 mL/hr over 30 Minutes Intravenous To Radiology 08/15/20 1346 08/17/20 1111   07/22/20 0830  cefTRIAXone (ROCEPHIN) 2 g in sodium chloride 0.9 % 100 mL IVPB        2 g 200 mL/hr over 30 Minutes Intravenous Every 24 hours 07/22/20 0720 07/26/20 0858       Time spent: 20 minutes    Erin Hearing ANP  [facility] 7 am - 330 pm/M-F for direct patient care and secure chat Please refer to Amion for contact information 116 days

## 2020-11-16 NOTE — Progress Notes (Signed)
[facility] PROGRESS NOTE  KENTRELL HALLAHAN LFY:[id number] DOB: February 02, 1961 DOA: 07/22/2020 PCP: Patient, No Pcp Per (Inactive)       Status: Remains inpatient appropriate because:Altered mental status and Unsafe d/c plan   Dispo: The patient is from: Home              Anticipated d/c is to: SNF              Anticipated d/c date is: > 3 days              Patient currently is medically stable to d/c.  Barriers to discharge: Medicaid and disability pending. No SNF bed offers.  No further nocturnal agitation or wandering behavior so hopeful will be eligible for SNF bed offer.  Patient able to ambulate 300 feet so hopefully he can eventually discharge home with family as long as they can provide appropriate 24/7 monitoring.  On 2/28 TOC contacted at [facility]   Code Status: Full Family Communication: 3/22 updated Sister [name] by telephone DVT prophylaxis: sq heparin Vaccination status: Completed both doses of Pfizer Covid vaccination on 12/14/2019; [place] booster ordered to be given on 3/3   Foley catheter: No  HPI: 60 year old male history of hypertension prescribed Norvasc prior to admission, chronic back pain, prior renal calculi who experienced an out of hospital cardiac arrest with downtime of 9 minutes.  Initial rhythm was ventricular fibrillation.  He was defibrillated deceived 6 doses of epi before ROSC.  Subsequent work-up revealed STEMI.  He underwent cardiac catheterization with a DES placed to the circumflex.  Cardiology following and has placed patient on Brilinta and aspirin and is also being treated with beta-blocker and statins.  From a neurological standpoint he clinically has anoxic brain injury.  During this hospitalization he experienced AKI and was treated with fluids and correction of hypoperfusion from cardiac arrest.  Echo performed this admission revealed ischemic cardiomyopathy with an EF of 40%  In addition to above patient has been unable  to pass swallowing evaluations and subsequently has undergone percutaneous PEG tube placement on 12/30.  Patient is awake and moving all extremities spontaneously but does not follow commands and does not verbalize palliative care has met with patient and family and has emphasized poor prognosis and will continue to meet with the family during the hospitalization.  Current plan is to transition to SNF bed once available.  Expect need for lifelong 24/7 care for all ADLs.  Subjective: Awake.  Staff states he did well with nursing student in regards to eating breakfast this morning.  When I asked him if he was going to take his medications he shook his head no and smiled.  I told him to be nice for the nursing student.  Objective: Vitals:   11/15/20 1933 11/16/20 0601  BP: (!) 150/94 114/83  Pulse: 75 80  Resp: 20 18  Temp: 98.4 F (36.9 C) 97.9 F (36.6 C)  SpO2: 99% 97%    Intake/Output Summary (Last 24 hours) at 11/16/2020 0814 Last data filed at 11/16/2020 0606 Gross per 24 hour  Intake 240 ml  Output 650 ml  Net -410 ml   Filed Weights   11/14/20 0400 11/15/20 0238 11/16/20 0235  Weight: 85.7 kg 84.3 kg 85.7 kg    Exam: Constitutional: Awake, calm, no acute distress Heart: Normal heart sounds, regular pulse, skin warm and dry, no peripheral edema Respiratory: Bilateral lung sounds clear to auscultation posteriorly.  Remained stable on room air. Abdomen: LBM 3/30,  soft nontender nondistended.  Variable oral intake.  Bowel sounds present and normal active Neurologic: CN 2-12 intact, still has significant issues with dysarthria Able to ambulate independently. Psychiatric: Awake, oriented times name and place although has difficulty answering orientation questions effectively given dysarthria and aphasia.    Assessment/Plan: Acute problems: Out of hospital cardiac arrest secondary to STEMI -ROSC and subsequent cardiac catheterization revealed dz in circumflex requiring  DES -Continue beta-blocker, statin, Plavix, and Imdur  Significant anoxic brain injury secondary to cardiac arrest/deconditioning/gait instability -Consistently participating with PT and OT.  SLP has cleared for dysphagia 2 with thin liquids -Declined by CIR since family unable to provide 24/7 care.  Plan is for SNF -3/25 changed Seroquel to bedtime dosing only 25 mg -3/25 discontinued Symmetrel  **PT NOTES FROM 3/30**  Pt tolerates treatment well but continues to require significant motivation from therapist and staff to progress mobility. Pt with limited awareness of deficits and safety. Pt continues to require cues to facilitate anterior trunk lean during transfers. Pt remains very easily distracted during session and demonstrates poor retention between sessions which is a limiting factor in progress. Pt will benefit from continued acute PT POC to reduce falls risk. PT continues to recommend SNF placement.    SLP notes 3/28:   Pt demonstrated prolonged mastication but good oral clearance with PO trials today. He benefited from liquid washes to ensure his oral cavity was completely clear especially with recent upgrade in solids to DYS 3. Recommend continuation of his current diet with full supervision to check for oral holding of solids and to initiate PO intake. Pt also benefited from cueing for repetition to communicate his wants/needs especially when branching up to two-word phrases. SLP will continue to follow for dysphagia and cog-ling tx.    OT notes 3/24:  Patient admitted 110 days ago for the above diagnosis.  During this time Varnell has made progress with his mobility, and certain aspects of his self care.  Unfortunately, OT believes he is most likely at his new cognitive baseline, and it is not expected, he will progress much beyond his current level for bathing, dressing, toileting and self feeding.  Over the past treatments, he has shown inconsistency in physical performance of ADL  mainly due to continued balance deficits, and cognitive deficits.  Hearl will need 24 hour minimal assist, supervision, and cueing for most, if not all, functional tasks.  He needs cues to initiate tasks, and cues to sequence through tasks to complete them, and this is not anticipated to improve.  This OT had conversations with NT and RN that are familiar with [name].  We discussed trying to establish a routine for [facility]: consistent awake times, times in the chair, rest times, ADL times, and a toileting schedule.  Both NT and RN verbalized they try to accomplish this, but as described above, inconsistencies with behavior, cooperation and cognitive capacity are the barriers.  OT continues to recommend 24 hour assist as needed, SNF is an option, but home with family and a toileting plan, could be considered if 24/7 is possible.  Of note: patient's diet was downgraded to full liquid 3/23 due to pocketing of solids, whereas earlier in the week he ate meat and potatoes with utensils and supervision without assist.  Please re-consult OT if definitive, documented improvements in cognition impacting ADL are noted.  Wadie would benefit from a care planned routine that is followed consistently.        Situational depression -Started Celexa 2/16-increased to  20 mg 2/18 and given increase in depression likely related to significant other's diagnosis of breast cancer on 3/25 will increase Celexa to 40 mg  Ischemic cardiomyopathy -Echo this admission EF 40% -Given difficulty taking oral medications consistently have opted to transition to once daily dosing as follows: Change Coreg to Toprol-XL 12.5 daily Discontinue hydralazine Changed 3 times daily Isordil to Imdur 30 mg daily -No ACE inhibitor secondary to underlying kidney disease -Follow-up echocardiogram done March 2022 demonstrates an EF of 40 to 45% with LVH and grade 1 diastolic dysfunction physiology  Nutrition Status: Dysphagia with moderate protein  calorie malnutrition Nutrition Problem: Increased nutrient needs Etiology: chronic illness (CKD3b) Signs/Symptoms: estimated needs Interventions: Ensure Enlive (each supplement provides 350kcal and 20 grams of protein),MVI,Magic cup,Prostat,Refer to RD note for recommendations PEG tube discontinued on 3/23  Tolerating D3 diet Continue Ensure protein beverages twice daily between meals;  Estimated body mass index is 25.62 kg/m as calculated from the following:   Height as of this encounter: 6' (1.829 m).   Weight as of this encounter: 85.7 kg.  Hypertension -Was on Norvasc prior to admission -Continue cardiovascular meds as listed above  Acute on chronic kidney disease stage IIIb -Suspect prerenal etiology in the setting of hypoperfusion from ventricular fibrillation arrest -Baseline creatinine between 1.6 and 2-creatinine as of 3/14 stable at 1.33    Other problems: Acute hypernatremia -Continue free water per tube  E. coli UTI -Has completed 5 days of Bactrim  Hyperglycemia without an underlying diagnosis of diabetes mellitus Resolved -Likely related to acute stress -Hemoglobin A1c 4.7   Acute hypoxemic respiratory failure -Resolved -Stable on room air with O2 sats between 97 and 99%  Nonsustained VT -Continue amiodarone and beta-blocker -3/26 LFTs normal  Skin tear right lateral back/Rash Wound / Incision (Open or Dehisced) 07/28/20 Skin tear Back Lateral;Right;Upper (Active)  Date First Assessed/Time First Assessed: 07/28/20 0300   Wound Type: Skin tear  Location: Back  Location Orientation: Lateral;Right;Upper  Present on Admission: No    Assessments 07/28/2020  7:49 PM 11/15/2020 10:00 AM  Dressing Type None --  Wound Length (cm) -- 0 cm  Wound Width (cm) -- 0 cm  Wound Depth (cm) -- 0 cm  Wound Volume (cm^3) -- 0 cm^3  Wound Surface Area (cm^2) -- 0 cm^2  Drainage Amount None --  Treatment Cleansed --     No Linked orders to display   -Rash  resolved   Data Reviewed: Basic Metabolic Panel: Recent Labs  Lab 11/11/20 0219  NA 140  K 3.5  CL 105  CO2 26  GLUCOSE 102*  BUN 10  CREATININE 1.26*  CALCIUM 9.5   Liver Function Tests: Recent Labs  Lab 11/11/20 0219  AST 19  ALT 34  ALKPHOS 74  BILITOT 1.0  PROT 7.1  ALBUMIN 3.2*   No results for input(s): LIPASE, AMYLASE in the last 168 hours. No results for input(s): AMMONIA in the last 168 hours. CBC: Recent Labs  Lab 11/11/20 0219  WBC 7.5  NEUTROABS 3.1  HGB 12.4*  HCT 38.3*  MCV 94.8  PLT 177   Cardiac Enzymes: No results for input(s): CKTOTAL, CKMB, CKMBINDEX, TROPONINI in the last 168 hours. BNP (last 3 results) No results for input(s): BNP in the last 8760 hours.  ProBNP (last 3 results) No results for input(s): PROBNP in the last 8760 hours.  CBG: No results for input(s): GLUCAP in the last 168 hours.  No results found for this or any previous visit (from  the past 240 hour(s)).   Studies: No results found.  Scheduled Meds: . aspirin  81 mg Oral Daily  . atorvastatin  80 mg Oral Daily  . chlorhexidine gluconate (MEDLINE KIT)  15 mL Mouth Rinse BID  . citalopram  40 mg Oral Daily  . clopidogrel  75 mg Oral Daily  . enoxaparin (LOVENOX) injection  40 mg Subcutaneous Q24H  . feeding supplement  237 mL Oral BID BM  . isosorbide mononitrate  30 mg Oral Daily  . mouth rinse  15 mL Mouth Rinse BID  . metoprolol succinate  12.5 mg Oral Daily  . multivitamin with minerals  1 tablet Oral Daily  . polyvinyl alcohol  1 drop Both Eyes QID  . QUEtiapine  25 mg Oral QHS  . sodium chloride flush  3 mL Intravenous Q12H   Continuous Infusions:   Active Problems:   Acute ST elevation myocardial infarction (STEMI) (HCC)   Cardiac arrest (HCC)   Elevated blood pressure reading with diagnosis of hypertension   Shock circulatory (HCC)   Encephalopathy   History of ETT   Cardiomyopathy, ischemic   Anoxic brain damage ([place])   Dysphagia   DNR  (do not resuscitate) discussion   Gait instability   E. coli UTI   Essential hypertension   Situational depression   Consultants:  Cardiology  Interventional radiology  PCCM  Neurology  Procedures:  Cardiac catheterization 12/4  Continuous EEG 12/4 and overnight EEG with video 12/5  Echocardiogram 12/5  EEG 12/9  Percutaneous PEG tube placement 12/30  Antibiotics: Anti-infectives (From admission, onward)   Start     Dose/Rate Route Frequency Ordered Stop   10/14/20 1000  fluconazole (DIFLUCAN) tablet 100 mg        100 mg Oral Daily 10/14/20 0848 10/18/20 0932   09/13/20 1000  sulfamethoxazole-trimethoprim (BACTRIM) 200-40 MG/5ML suspension 20 mL        20 mL Per Tube Every 12 hours 09/13/20 0734 09/19/20 0926   09/13/20 0745  cefTRIAXone (ROCEPHIN) 1 g in sodium chloride 0.9 % 100 mL IVPB  Status:  Discontinued        1 g 200 mL/hr over 30 Minutes Intravenous Every 24 hours 09/13/20 0658 09/13/20 0734   08/17/20 0000  ceFAZolin (ANCEF) IVPB 2g/100 mL premix        2 g 200 mL/hr over 30 Minutes Intravenous To Radiology 08/15/20 1346 08/17/20 1111   07/22/20 0830  cefTRIAXone (ROCEPHIN) 2 g in sodium chloride 0.9 % 100 mL IVPB        2 g 200 mL/hr over 30 Minutes Intravenous Every 24 hours 07/22/20 0720 07/26/20 0858       Time spent: 20 minutes    Erin Hearing ANP  [facility] 7 am - 330 pm/M-F for direct patient care and secure chat Please refer to Amion for contact information 117 days

## 2020-11-17 NOTE — TOC Progression Note (Signed)
Transition of Care [facility]) - Progression Note    Patient Details  Name: Antonio Navarro MRN: [id number] Date of Birth: [birth year]  Transition of Care [facility]) CM/SW Contact  Curlene Labrum, RN Phone Number: 11/17/2020, 11:59 AM  Clinical Narrative:    Case management revised and updated the patient's FL2 and re-faxed the patient out to numerous SNF facilities in the hub to explore admission opportunities for the patient.  [facility] is willing to offer LOG for placement including PCS services, medication and room and board.  This information was included in an internal comment before faxing clinicals to the facilities.  The patient is fully vaccinated.  CM and MSW on Difficult to Place Team will continue to explore options for SNF versus Group Home placement considering the patient has no bed offers in the hub at this time.   Expected Discharge Plan: Skilled Nursing Facility Barriers to Discharge: Continued Medical Work up,No SNF bed  Expected Discharge Plan and Services Expected Discharge Plan: [place] arrangements for the past 2 months: Single Family Home                                       Social Determinants of Health (SDOH) Interventions    Readmission Risk Interventions No flowsheet data found.

## 2020-11-17 NOTE — Progress Notes (Signed)
  Speech Language Pathology Treatment: Dysphagia;Cognitive-Linquistic  Patient Details Name: Antonio Navarro MRN: [id number] DOB: 07-29-1961 Today's Date: 11/17/2020 Time: [id number] SLP Time Calculation (min) (ACUTE ONLY): 22 min  Assessment / Plan / Recommendation Clinical Impression  Pt consumed thin liquids and graham crackers with peanut butter without overt s/s of aspiration. Prolonged mastication was noted but no oral holding or residue was observed. Recommend continuation of his current diet and to check for oral holding with solids as this reported to still occur intermittently. Pt was also seen for cognitive-linguistic treatment in which he was noted to read a two-word phrase when prompted to do so by the SLP. However, when SLP wrote down phrase level commands he required Max verbal and visual cues to read and follow the commands even when SLP branched down to reading one word at a time. Pt followed one of these commands after he was given a verbal cue but only attempted one other with a model. SLP will continue to follow for dysphagia and cognitive-linguistic treatment.    HPI HPI: Pt is a 60 yo male s/p arrest with downtime 9 minutes. Initial rhythm was V. fib, shocked and received 6 rounds of epi before ROSC achieved.  Total CPR time was 30 minutes. ETT 12/4-12/13. MRI 12/7 suggestive of a widespread anoxic injury. PMH includes kidney stones and HTN.      SLP Plan  Continue with current plan of care       Recommendations  Diet recommendations: Dysphagia 3 (mechanical soft);Thin liquid Liquids provided via: Cup;Straw Medication Administration: Crushed with puree Supervision: Staff to assist with self feeding;Full supervision/cueing for compensatory strategies Compensations: Slow rate;Small sips/bites;Minimize environmental distractions Postural Changes and/or Swallow Maneuvers: Seated upright 90 degrees                Oral Care Recommendations: Oral care BID Follow up  Recommendations: Skilled Nursing facility SLP Visit Diagnosis: Dysphagia, unspecified (R13.10) Plan: Continue with current plan of care       GO                Jeanine Luz., SLP Student 11/17/2020, 3:27 PM

## 2020-11-17 NOTE — Progress Notes (Addendum)
[facility] PROGRESS NOTE  CEZAR MISIASZEK VZS:[id number] DOB: 11-03-1960 DOA: 07/22/2020 PCP: Patient, No Pcp Per (Inactive)       Status: Remains inpatient appropriate because:Altered mental status and Unsafe d/c plan   Dispo: The patient is from: Home              Anticipated d/c is to: SNF              Anticipated d/c date is: > 3 days              Patient currently is medically stable to d/c.  Barriers to discharge: Medicaid and disability pending. No SNF bed offers.  No further nocturnal agitation or wandering behavior so hopeful will be eligible for SNF bed offer.  Improving from a PT standpoint.  May be a candidate for a group home especially once Medicaid is activated.  TOC will explore options.   Code Status: Full Family Communication: 4/1 updated sister [name] by telephone DVT prophylaxis: sq heparin Vaccination status: Completed both doses of Pfizer Covid vaccination on 12/14/2019; Pfizer booster ordered to be given on 3/3   Foley catheter: No  HPI: 60 year old male history of hypertension prescribed Norvasc prior to admission, chronic back pain, prior renal calculi who experienced an out of hospital cardiac arrest with downtime of 9 minutes.  Initial rhythm was ventricular fibrillation.  He was defibrillated deceived 6 doses of epi before ROSC.  Subsequent work-up revealed STEMI.  He underwent cardiac catheterization with a DES placed to the circumflex.  Cardiology following and has placed patient on Brilinta and aspirin and is also being treated with beta-blocker and statins.  From a neurological standpoint he clinically has anoxic brain injury.  During this hospitalization he experienced AKI and was treated with fluids and correction of hypoperfusion from cardiac arrest.  Echo performed this admission revealed ischemic cardiomyopathy with an EF of 40%  In addition to above patient has been unable to pass swallowing evaluations and subsequently has undergone  percutaneous PEG tube placement on 12/30.  Patient is awake and moving all extremities spontaneously but does not follow commands and does not verbalize palliative care has met with patient and family and has emphasized poor prognosis and will continue to meet with the family during the hospitalization.  Current plan is to transition to SNF bed once available.  Expect need for lifelong 24/7 care for all ADLs.  Subjective: Awakened.  Has typical he is not a morning person and had limited interaction but was appropriate and smiled.  Objective: Vitals:   11/16/20 2035 11/17/20 0636  BP: (!) 165/102 (!) 152/96  Pulse: 97 92  Resp: 18 16  Temp: 97.9 F (36.6 C)   SpO2: 97% 96%    Intake/Output Summary (Last 24 hours) at 11/17/2020 0820 Last data filed at 11/16/2020 1824 Gross per 24 hour  Intake 240 ml  Output --  Net 240 ml   Filed Weights   11/15/20 0238 11/16/20 0235 11/17/20 0500  Weight: 84.3 kg 85.7 kg 83.4 kg    Exam: Constitutional: Awakened, calm, no acute distress Heart: Normal heart sounds, pulse regular, no peripheral edema, skin warm and dry and pink Respiratory: Lungs are clear, breathing well in supine position, room air Abdomen: LBM 3/30; soft nontender.  Variable oral intake.  Is picky eater. Neurologic: CN 2-12 intact, still has significant issues with dysarthria Able to ambulate independently. Psychiatric: Awake.  Oriented times name.  Difficulty answering orientation questions secondary to ongoing issues with aphasia and  dysarthria.    Assessment/Plan: Acute problems: Out of hospital cardiac arrest secondary to STEMI -ROSC and subsequent cardiac catheterization revealed dz in circumflex requiring DES -Continue beta-blocker, statin, Plavix, and Imdur  Significant anoxic brain injury secondary to cardiac arrest/deconditioning/gait instability -Consistently participating with PT and OT.  SLP has cleared for dysphagia 2 with thin liquids -Declined by CIR since  family unable to provide 24/7 care.  Plan is for SNF -3/25 changed Seroquel to bedtime dosing only 25 mg -3/25 discontinued Symmetrel  **PT NOTES FROM 3/30**  Pt tolerates treatment well but continues to require significant motivation from therapist and staff to progress mobility. Pt with limited awareness of deficits and safety. Pt continues to require cues to facilitate anterior trunk lean during transfers. Pt remains very easily distracted during session and demonstrates poor retention between sessions which is a limiting factor in progress. Pt will benefit from continued acute PT POC to reduce falls risk. PT continues to recommend SNF placement.    SLP notes 3/28:   Pt demonstrated prolonged mastication but good oral clearance with PO trials today. He benefited from liquid washes to ensure his oral cavity was completely clear especially with recent upgrade in solids to DYS 3. Recommend continuation of his current diet with full supervision to check for oral holding of solids and to initiate PO intake. Pt also benefited from cueing for repetition to communicate his wants/needs especially when branching up to two-word phrases. SLP will continue to follow for dysphagia and cog-ling tx.    OT notes 3/24:  Patient admitted 110 days ago for the above diagnosis.  During this time Lynard has made progress with his mobility, and certain aspects of his self care.  Unfortunately, OT believes he is most likely at his new cognitive baseline, and it is not expected, he will progress much beyond his current level for bathing, dressing, toileting and self feeding.  Over the past treatments, he has shown inconsistency in physical performance of ADL mainly due to continued balance deficits, and cognitive deficits.  Add will need 24 hour minimal assist, supervision, and cueing for most, if not all, functional tasks.  He needs cues to initiate tasks, and cues to sequence through tasks to complete them, and this is  not anticipated to improve.  This OT had conversations with NT and RN that are familiar with [name].  We discussed trying to establish a routine for [facility]: consistent awake times, times in the chair, rest times, ADL times, and a toileting schedule.  Both NT and RN verbalized they try to accomplish this, but as described above, inconsistencies with behavior, cooperation and cognitive capacity are the barriers.  OT continues to recommend 24 hour assist as needed, SNF is an option, but home with family and a toileting plan, could be considered if 24/7 is possible.  Of note: patient's diet was downgraded to full liquid 3/23 due to pocketing of solids, whereas earlier in the week he ate meat and potatoes with utensils and supervision without assist.  Please re-consult OT if definitive, documented improvements in cognition impacting ADL are noted.  Kaesen would benefit from a care planned routine that is followed consistently.        Situational depression -Started Celexa 2/16-increased to 20 mg 2/18 and given increase in depression likely related to significant other's diagnosis of breast cancer on 3/25 will increase Celexa to 40 mg  Ischemic cardiomyopathy -Echo this admission EF 40% -Given difficulty taking oral medications consistently have opted to transition to once  daily dosing as follows: Change Coreg to Toprol-XL 12.5 daily Discontinue hydralazine Changed 3 times daily Isordil to Imdur 30 mg daily -No ACE inhibitor secondary to underlying kidney disease -Follow-up echocardiogram done March 2022 demonstrates an EF of 40 to 45% with LVH and grade 1 diastolic dysfunction physiology  Nutrition Status: Dysphagia with moderate protein calorie malnutrition Nutrition Problem: Increased nutrient needs Etiology: chronic illness (CKD3b) Signs/Symptoms: estimated needs Interventions: Ensure Enlive (each supplement provides 350kcal and 20 grams of protein),MVI,Magic cup,Prostat,Refer to RD note for  recommendations PEG tube discontinued on 3/23  Tolerating D3 diet Continue Ensure protein beverages twice daily between meals;  Estimated body mass index is 24.94 kg/m as calculated from the following:   Height as of this encounter: 6' (1.829 m).   Weight as of this encounter: 83.4 kg.  Hypertension -Was on Norvasc prior to admission -Continue cardiovascular meds as listed above  Acute on chronic kidney disease stage IIIb -Suspect prerenal etiology in the setting of hypoperfusion from ventricular fibrillation arrest -Baseline creatinine between 1.6 and 2-creatinine as of 3/14 stable at 1.33    Other problems: Acute hypernatremia -Continue free water per tube  E. coli UTI -Has completed 5 days of Bactrim  Hyperglycemia without an underlying diagnosis of diabetes mellitus Resolved -Likely related to acute stress -Hemoglobin A1c 4.7   Acute hypoxemic respiratory failure -Resolved -Stable on room air with O2 sats between 97 and 99%  Nonsustained VT -Continue amiodarone and beta-blocker -3/26 LFTs normal  Skin tear right lateral back/Rash Wound / Incision (Open or Dehisced) 07/28/20 Skin tear Back Lateral;Right;Upper (Active)  Date First Assessed/Time First Assessed: 07/28/20 0300   Wound Type: Skin tear  Location: Back  Location Orientation: Lateral;Right;Upper  Present on Admission: No    Assessments 07/28/2020  7:49 PM 11/15/2020 10:00 AM  Dressing Type None --  Wound Length (cm) -- 0 cm  Wound Width (cm) -- 0 cm  Wound Depth (cm) -- 0 cm  Wound Volume (cm^3) -- 0 cm^3  Wound Surface Area (cm^2) -- 0 cm^2  Drainage Amount None --  Treatment Cleansed --     No Linked orders to display   -Rash resolved   Data Reviewed: Basic Metabolic Panel: Recent Labs  Lab 11/11/20 0219  NA 140  K 3.5  CL 105  CO2 26  GLUCOSE 102*  BUN 10  CREATININE 1.26*  CALCIUM 9.5   Liver Function Tests: Recent Labs  Lab 11/11/20 0219  AST 19  ALT 34  ALKPHOS 74  BILITOT  1.0  PROT 7.1  ALBUMIN 3.2*   No results for input(s): LIPASE, AMYLASE in the last 168 hours. No results for input(s): AMMONIA in the last 168 hours. CBC: Recent Labs  Lab 11/11/20 0219  WBC 7.5  NEUTROABS 3.1  HGB 12.4*  HCT 38.3*  MCV 94.8  PLT 177   Cardiac Enzymes: No results for input(s): CKTOTAL, CKMB, CKMBINDEX, TROPONINI in the last 168 hours. BNP (last 3 results) No results for input(s): BNP in the last 8760 hours.  ProBNP (last 3 results) No results for input(s): PROBNP in the last 8760 hours.  CBG: No results for input(s): GLUCAP in the last 168 hours.  No results found for this or any previous visit (from the past 240 hour(s)).   Studies: No results found.  Scheduled Meds: . aspirin  81 mg Oral Daily  . atorvastatin  80 mg Oral Daily  . chlorhexidine  15 mL Mouth Rinse BID  . citalopram  40 mg Oral Daily  .  clopidogrel  75 mg Oral Daily  . enoxaparin (LOVENOX) injection  40 mg Subcutaneous Q24H  . feeding supplement  237 mL Oral BID BM  . isosorbide mononitrate  30 mg Oral Daily  . mouth rinse  15 mL Mouth Rinse BID  . metoprolol succinate  12.5 mg Oral Daily  . multivitamin with minerals  1 tablet Oral Daily  . polyvinyl alcohol  1 drop Both Eyes QID  . QUEtiapine  25 mg Oral QHS  . sodium chloride flush  3 mL Intravenous Q12H   Continuous Infusions:   Active Problems:   Acute ST elevation myocardial infarction (STEMI) (HCC)   Cardiac arrest (HCC)   Elevated blood pressure reading with diagnosis of hypertension   Shock circulatory (HCC)   Encephalopathy   History of ETT   Cardiomyopathy, ischemic   Anoxic brain damage ([place])   Dysphagia   DNR (do not resuscitate) discussion   Gait instability   E. coli UTI   Essential hypertension   Situational depression   Consultants:  Cardiology  Interventional radiology  PCCM  Neurology  Procedures:  Cardiac catheterization 12/4  Continuous EEG 12/4 and overnight EEG with video  12/5  Echocardiogram 12/5  EEG 12/9  Percutaneous PEG tube placement 12/30  Antibiotics: Anti-infectives (From admission, onward)   Start     Dose/Rate Route Frequency Ordered Stop   10/14/20 1000  fluconazole (DIFLUCAN) tablet 100 mg        100 mg Oral Daily 10/14/20 0848 10/18/20 0932   09/13/20 1000  sulfamethoxazole-trimethoprim (BACTRIM) 200-40 MG/5ML suspension 20 mL        20 mL Per Tube Every 12 hours 09/13/20 0734 09/19/20 0926   09/13/20 0745  cefTRIAXone (ROCEPHIN) 1 g in sodium chloride 0.9 % 100 mL IVPB  Status:  Discontinued        1 g 200 mL/hr over 30 Minutes Intravenous Every 24 hours 09/13/20 0658 09/13/20 0734   08/17/20 0000  ceFAZolin (ANCEF) IVPB 2g/100 mL premix        2 g 200 mL/hr over 30 Minutes Intravenous To Radiology 08/15/20 1346 08/17/20 1111   07/22/20 0830  cefTRIAXone (ROCEPHIN) 2 g in sodium chloride 0.9 % 100 mL IVPB        2 g 200 mL/hr over 30 Minutes Intravenous Every 24 hours 07/22/20 0720 07/26/20 0858       Time spent: 20 minutes    Erin Hearing ANP  [facility] 7 am - 330 pm/M-F for direct patient care and secure chat Please refer to Amion for contact information 118 days   Addendum Patient seen and examined, awake but no interaction.  Chart reviewed, significant anoxic brain injury due to out of hospital cardiac arrest, STEMI. Continue current management and plan per Ms. Lissa Merlin.  Awaiting placement.   Estill Cotta M.D.  [facility] 11/20/2020, 2:59 PM

## 2020-11-17 NOTE — NC FL2 (Addendum)
[place] LEVEL OF CARE SCREENING TOOL     IDENTIFICATION  Patient Name: Antonio Navarro Birthdate: 1961-03-10 Sex: male Admission Date (Current Location): 07/22/2020  [place] and [place] Number:  [place] and disability pending #[id number] Facility and Address:  [facility] [place], [place] [address] [place], [place] [address]      Provider Number: [id number]  Attending Physician Name and Address:  Bonnell Public, MD  Relative Name and Phone Number:  Adline Mango - sister - [phone]    Current Level of Care: Hospital Recommended Level of Care: [place] Prior Approval Number:    Date Approved/Denied:   PASRR Number: [id number]  Discharge Plan: Other (Comment) (SNF versus ALF)    Current Diagnoses: Patient Active Problem List   Diagnosis Date Noted  . Situational depression   . Essential hypertension   . E. coli UTI   . Gait instability   . DNR (do not resuscitate) discussion   . Anoxic brain damage (HCC)   . Dysphagia   . Cardiomyopathy, ischemic   . History of ETT   . Encephalopathy   . Acute ST elevation myocardial infarction (STEMI) ([place])   . Cardiac arrest ([place])   . Elevated blood pressure reading with diagnosis of hypertension   . Shock circulatory ([place])     Orientation RESPIRATION BLADDER Height & Weight     Self,Place  Normal External catheter Weight: 83.4 kg Height:  6' (182.9 cm)  BEHAVIORAL SYMPTOMS/MOOD NEUROLOGICAL BOWEL NUTRITION STATUS  [place]   Incontinent Supplemental,Diet  AMBULATORY STATUS COMMUNICATION OF NEEDS Skin   Supervision Verbally Normal                       Personal Care Assistance Level of Assistance  Bathing,Dressing,Feeding Bathing Assistance: Maximum assistance Feeding assistance: Limited assistance Dressing Assistance: Maximum assistance     Functional Limitations Info  Sight,Hearing,Speech Sight Info: Adequate Hearing Info: Adequate Speech  Info: Impaired    SPECIAL CARE FACTORS FREQUENCY  PT (By licensed PT),OT (By licensed OT),Speech therapy     PT Frequency: 5x weekly OT Frequency: 5x weekly     Speech Therapy Frequency: 5x weekly      Contractures Contractures Info: Not present    Additional Factors Info  Code Status,Allergies,Psychotropic Code Status Info: Full code Allergies Info: NKDA Psychotropic Info: Seroquel         Current Medications (11/17/2020):  This is the current hospital active medication list Current Facility-Administered Medications  Medication Dose Route Frequency Provider Last Rate Last Admin  . acetaminophen (TYLENOL) tablet 650 mg  650 mg Oral Q6H PRN Samella Parr, NP      . aspirin chewable tablet 81 mg  81 mg Oral Daily Samella Parr, NP   81 mg at 11/17/20 1000  . atorvastatin (LIPITOR) tablet 80 mg  80 mg Oral Daily Erin Hearing L, NP   80 mg at 11/17/20 1000  . chlorhexidine (PERIDEX) 0.12 % solution 15 mL  15 mL Mouth Rinse BID Lorretta Harp, MD   15 mL at 11/16/20 0845  . citalopram (CELEXA) tablet 40 mg  40 mg Oral Daily Erin Hearing L, NP   40 mg at 11/17/20 1000  . clopidogrel (PLAVIX) tablet 75 mg  75 mg Oral Daily Samella Parr, NP   75 mg at 11/17/20 1000  . docusate sodium (COLACE) capsule 100 mg  100 mg Oral BID PRN Samella Parr, NP      .  enoxaparin (LOVENOX) injection 40 mg  40 mg Subcutaneous Q24H Barb Merino, MD   40 mg at 11/16/20 1827  . feeding supplement (ENSURE ENLIVE / ENSURE PLUS) liquid 237 mL  237 mL Oral BID BM Samella Parr, NP   237 mL at 11/16/20 1337  . hydrALAZINE (APRESOLINE) injection 5 mg  5 mg Intravenous Q4H PRN Arrien, Jimmy Picket, MD   5 mg at 11/15/20 1805  . isosorbide mononitrate (IMDUR) 24 hr tablet 30 mg  30 mg Oral Daily Erin Hearing L, NP   30 mg at 11/17/20 1000  . MEDLINE mouth rinse  15 mL Mouth Rinse BID Jacky Kindle, MD   15 mL at 11/16/20 1031  . metoprolol succinate (TOPROL-XL) 24 hr tablet 12.5 mg   12.5 mg Oral Daily Erin Hearing L, NP   12.5 mg at 11/17/20 1000  . multivitamin with minerals tablet 1 tablet  1 tablet Oral Daily Nolberto Hanlon, MD   1 tablet at 11/17/20 1000  . polyethylene glycol (MIRALAX / GLYCOLAX) packet 17 g  17 g Oral Daily PRN Samella Parr, NP      . polyvinyl alcohol (LIQUIFILM TEARS) 1.4 % ophthalmic solution 1 drop  1 drop Both Eyes QID Candee Furbish, MD   1 drop at 11/16/20 1827  . QUEtiapine (SEROQUEL) tablet 25 mg  25 mg Oral QHS Samella Parr, NP   25 mg at 11/16/20 2029  . sodium chloride flush (NS) 0.9 % injection 3 mL  3 mL Intravenous Q12H Lorretta Harp, MD   3 mL at 11/14/20 2136  . sodium chloride flush (NS) 0.9 % injection 3 mL  3 mL Intravenous PRN Lorretta Harp, MD         Discharge Medications: Please see discharge summary for a list of discharge medications.  Relevant Imaging Results:  Relevant Lab Results:   Additional Information SSN: [place], [place]

## 2020-11-17 NOTE — Progress Notes (Signed)
Physical Therapy Treatment Patient Details Name: Antonio Navarro MRN: [id number] DOB: Oct 22, 1960 Today's Date: 11/17/2020    History of Present Illness Pt is a 60 y.o. male admitted 07/22/20 post-cardiac arrest with STEMI; found to be in V fib with resucitation by EMS in the field, approximately 30-min of CPR. ETT 12/4-12/13. MRI 12/7 suggestive of a widespread anoxic injury. S/p PEG tube 12/30. PEG removed 3/23. PMH includes tobacco abuse, HTN, chronic back pain.  PEG tube removed.    PT Comments    Pt tolerates treatment well and ambulates for limited community distances. Pt performance improved by arrival of fiance who helps to motivate. Pt is able to increase gait speed during session and demonstrates good balance when focused on task, however is easily distracted and  Loses balance once due to this. Pt will benefit from continued aggressive mobilization to improve mobility and reduce falls risk. PT continues to recommends SNF placement however the pt does continue to slowly improve. Will be beneficial to establish what tasks the pt needs to perform and at what assist level to discharge home safely.  Follow Up Recommendations  SNF;Supervision/Assistance - 24 hour (improving mobility)     Equipment Recommendations  Rolling walker with 5" wheels;3in1 (PT);Wheelchair (measurements PT);Wheelchair cushion (measurements PT)    Recommendations for Other Services       Precautions / Restrictions Precautions Precautions: Fall Restrictions Weight Bearing Restrictions: No    Mobility  Bed Mobility Overal bed mobility: Needs Assistance Bed Mobility: Supine to Sit     Supine to sit: Supervision          Transfers Overall transfer level: Needs assistance Equipment used: Rolling walker (2 wheeled) Transfers: Sit to/from Stand Sit to Stand: Min guard            Ambulation/Gait Ambulation/Gait assistance: Min guard;Min assist (minG, one brief instance of minA when distracted and  making quick turn around object in hall) Gait Distance (Feet): 160 Feet Assistive device: Rolling walker (2 wheeled) Gait Pattern/deviations: Step-through pattern Gait velocity: reduced Gait velocity interpretation: <1.8 ft/sec, indicate of risk for recurrent falls General Gait Details: pt with step-through gait, able to increase gait speed to verbal cue   Stairs             Wheelchair Mobility    Modified Rankin (Stroke Patients Only)       Balance Overall balance assessment: Needs assistance Sitting-balance support: No upper extremity supported;Feet supported Sitting balance-Leahy Scale: Good     Standing balance support: Single extremity supported;Bilateral upper extremity supported Standing balance-Leahy Scale: Poor Standing balance comment: reliant on UE support of RW                            Cognition Arousal/Alertness: Awake/alert Behavior During Therapy: WFL for tasks assessed/performed Overall Cognitive Status: Impaired/Different from baseline Area of Impairment: Attention;Memory;Awareness;Problem solving;Rancho level               Rancho Levels of Cognitive Functioning Ranch[place] Amigos Scales of Cognitive Functioning: Confused/appropriate   Current Attention Level: Sustained Memory: Decreased recall of precautions;Decreased short-term memory Following Commands: Follows one step commands consistently Safety/Judgement: Decreased awareness of safety;Decreased awareness of deficits Awareness: Intellectual Problem Solving: Slow processing;Requires verbal cues        Exercises      General Comments General comments (skin integrity, edema, etc.): VSS on RA      Pertinent Vitals/Pain Pain Assessment: Faces Faces Pain Scale: No hurt  Home Living                      Prior Function            PT Goals (current goals can now be found in the care plan section) Acute Rehab PT Goals Patient Stated Goal: pt fiance would  like him to get intensive rehab Progress towards PT goals: Progressing toward goals    Frequency    Min 3X/week      PT Plan Current plan remains appropriate    Co-evaluation              AM-PAC PT "6 Clicks" Mobility   Outcome Measure  Help needed turning from your back to your side while in a flat bed without using bedrails?: A Little Help needed moving from lying on your back to sitting on the side of a flat bed without using bedrails?: A Little Help needed moving to and from a bed to a chair (including a wheelchair)?: A Little Help needed standing up from a chair using your arms (e.g., wheelchair or bedside chair)?: A Little Help needed to walk in hospital room?: A Little Help needed climbing 3-5 steps with a railing? : A Lot 6 Click Score: 17    End of Session Equipment Utilized During Treatment: Gait belt Activity Tolerance: Patient tolerated treatment well Patient left: in chair;with call bell/phone within reach;with family/visitor present Nurse Communication: Mobility status PT Visit Diagnosis: Other abnormalities of gait and mobility (R26.89);Difficulty in walking, not elsewhere classified (R26.2)     Time: 8416-6063 PT Time Calculation (min) (ACUTE ONLY): 13 min  Charges:  $Gait Training: 8-22 mins                     Zenaida Niece, PT, DPT Acute Rehabilitation Pager: [phone]    Zenaida Niece 11/17/2020, 5:04 PM

## 2020-11-18 LAB — COMPREHENSIVE METABOLIC PANEL
ALT: 32 U/L (ref 0–44)
AST: 19 U/L (ref 15–41)
Albumin: 3.3 g/dL — ABNORMAL LOW (ref 3.5–5.0)
Alkaline Phosphatase: 71 U/L (ref 38–126)
Anion gap: 10 (ref 5–15)
BUN: 11 mg/dL (ref 6–20)
CO2: 22 mmol/L (ref 22–32)
Calcium: 9.1 mg/dL (ref 8.9–10.3)
Chloride: 105 mmol/L (ref 98–111)
Creatinine, Ser: 1.27 mg/dL — ABNORMAL HIGH (ref 0.61–1.24)
GFR, Estimated: >60 mL/min (ref >60)
Glucose, Bld: 95 mg/dL (ref 70–99)
Potassium: 3.8 mmol/L (ref 3.5–5.1)
Sodium: 137 mmol/L (ref 135–145)
Total Bilirubin: 1 mg/dL (ref 0.3–1.2)
Total Protein: 7.1 g/dL (ref 6.5–8.1)

## 2020-11-18 LAB — CBC WITH DIFFERENTIAL/PLATELET
Abs Immature Granulocytes: 0.01 10*3/uL (ref 0.00–0.07)
Basophils Absolute: 0.1 10*3/uL (ref 0.0–0.1)
Basophils Relative: 2 %
Eosinophils Absolute: 0.4 10*3/uL (ref 0.0–0.5)
Eosinophils Relative: 6 %
HCT: 40.6 % (ref 39.0–52.0)
Hemoglobin: 13.1 g/dL (ref 13.0–17.0)
Immature Granulocytes: 0 %
Lymphocytes Relative: 43 %
Lymphs Abs: 2.9 10*3/uL (ref 0.7–4.0)
MCH: 30.4 pg (ref 26.0–34.0)
MCHC: 32.3 g/dL (ref 30.0–36.0)
MCV: 94.2 fL (ref 80.0–100.0)
Monocytes Absolute: 0.7 10*3/uL (ref 0.1–1.0)
Monocytes Relative: 10 %
Neutro Abs: 2.7 10*3/uL (ref 1.7–7.7)
Neutrophils Relative %: 39 %
Platelets: 174 10*3/uL (ref 150–400)
RBC: 4.31 MIL/uL (ref 4.22–5.81)
RDW: 13.3 % (ref 11.5–15.5)
WBC: 6.8 10*3/uL (ref 4.0–10.5)
nRBC: 0 % (ref 0.0–0.2)

## 2020-11-18 NOTE — Progress Notes (Signed)
[facility] PROGRESS NOTE  Antonio Navarro ZJQ:[id number] DOB: 1960-10-08 DOA: 07/22/2020 PCP: Patient, No Pcp Per (Inactive)       Status: Remains inpatient appropriate because:Altered mental status and Unsafe d/c plan   Dispo: The patient is from: Home              Anticipated d/c is to: SNF              Anticipated d/c date is: > 3 days              Patient currently is medically stable to d/c.  Barriers to discharge: Medicaid and disability pending. No SNF bed offers.  No further nocturnal agitation or wandering behavior so hopeful will be eligible for SNF bed offer.  Improving from a PT standpoint.  May be a candidate for a group home especially once Medicaid is activated.  TOC will explore options.   Code Status: Full Family Communication: 4/1 updated sister [name] by telephone DVT prophylaxis: sq heparin Vaccination status: Completed both doses of Pfizer Covid vaccination on 12/14/2019; Pfizer booster ordered to be given on 3/3   Foley catheter: No  HPI: 60 year old male history of hypertension prescribed Norvasc prior to admission, chronic back pain, prior renal calculi who experienced an out of hospital cardiac arrest with downtime of 9 minutes.  Initial rhythm was ventricular fibrillation.  He was defibrillated deceived 6 doses of epi before ROSC.  Subsequent work-up revealed STEMI.  He underwent cardiac catheterization with a DES placed to the circumflex.  Cardiology following and has placed patient on Brilinta and aspirin and is also being treated with beta-blocker and statins.  From a neurological standpoint he clinically has anoxic brain injury.  During this hospitalization he experienced AKI and was treated with fluids and correction of hypoperfusion from cardiac arrest.  Echo performed this admission revealed ischemic cardiomyopathy with an EF of 40%  In addition to above patient has been unable to pass swallowing evaluations and subsequently has undergone  percutaneous PEG tube placement on 12/30.  Patient is awake and moving all extremities spontaneously but does not follow commands and does not verbalize palliative care has met with patient and family and has emphasized poor prognosis and will continue to meet with the family during the hospitalization.  Current plan is to transition to SNF bed once available.  Expect need for lifelong 24/7 care for all ADLs.  11/18/2020: Patient seen alongside patient's partner.  Patient symptoms started today.  No changes.  Pursue disposition when feasible.  Subjective: Awake but no coherent history from patient.    Objective: Vitals:   11/18/20 0410 11/18/20 1305  BP: 140/81 (!) 110/94  Pulse: 79 83  Resp: 18 20  Temp: 98.2 F (36.8 C) 98 F (36.7 C)  SpO2: 100% 98%    Intake/Output Summary (Last 24 hours) at 11/18/2020 1827 Last data filed at 11/18/2020 1500 Gross per 24 hour  Intake 358 ml  Output 101 ml  Net 257 ml   Filed Weights   11/16/20 0235 11/17/20 0500 11/18/20 0410  Weight: 85.7 kg 83.4 kg 85.3 kg    Exam: Constitutional: Awakened, calm, no acute distress.  Patient actually seems happy today. Heart: S1-S2.   Respiratory: Clear to auscultation.  Abdomen: Soft and nontender.  Organs are not palpable.   Neurologic: Awake and alert.  Patient moves all extremities.  Extremities: No leg edema.    Assessment/Plan: Acute problems: Out of hospital cardiac arrest secondary to STEMI -ROSC and subsequent cardiac  catheterization revealed dz in circumflex requiring DES -Continue beta-blocker, statin, Plavix, and Imdur  Significant anoxic brain injury secondary to cardiac arrest/deconditioning/gait instability -Consistently participating with PT and OT.  SLP has cleared for dysphagia 2 with thin liquids -Declined by CIR since family unable to provide 24/7 care.  Plan is for SNF -3/25 changed Seroquel to bedtime dosing only 25 mg -3/25 discontinued Symmetrel  **PT NOTES FROM 3/30**  Pt  tolerates treatment well but continues to require significant motivation from therapist and staff to progress mobility. Pt with limited awareness of deficits and safety. Pt continues to require cues to facilitate anterior trunk lean during transfers. Pt remains very easily distracted during session and demonstrates poor retention between sessions which is a limiting factor in progress. Pt will benefit from continued acute PT POC to reduce falls risk. PT continues to recommend SNF placement.    SLP notes 3/28:   Pt demonstrated prolonged mastication but good oral clearance with PO trials today. He benefited from liquid washes to ensure his oral cavity was completely clear especially with recent upgrade in solids to DYS 3. Recommend continuation of his current diet with full supervision to check for oral holding of solids and to initiate PO intake. Pt also benefited from cueing for repetition to communicate his wants/needs especially when branching up to two-word phrases. SLP will continue to follow for dysphagia and cog-ling tx.    OT notes 3/24:  Patient admitted 110 days ago for the above diagnosis.  During this time Shaye has made progress with his mobility, and certain aspects of his self care.  Unfortunately, OT believes he is most likely at his new cognitive baseline, and it is not expected, he will progress much beyond his current level for bathing, dressing, toileting and self feeding.  Over the past treatments, he has shown inconsistency in physical performance of ADL mainly due to continued balance deficits, and cognitive deficits.  Emir will need 24 hour minimal assist, supervision, and cueing for most, if not all, functional tasks.  He needs cues to initiate tasks, and cues to sequence through tasks to complete them, and this is not anticipated to improve.  This OT had conversations with NT and RN that are familiar with [name].  We discussed trying to establish a routine for [facility]: consistent  awake times, times in the chair, rest times, ADL times, and a toileting schedule.  Both NT and RN verbalized they try to accomplish this, but as described above, inconsistencies with behavior, cooperation and cognitive capacity are the barriers.  OT continues to recommend 24 hour assist as needed, SNF is an option, but home with family and a toileting plan, could be considered if 24/7 is possible.  Of note: patient's diet was downgraded to full liquid 3/23 due to pocketing of solids, whereas earlier in the week he ate meat and potatoes with utensils and supervision without assist.  Please re-consult OT if definitive, documented improvements in cognition impacting ADL are noted.  Larry would benefit from a care planned routine that is followed consistently.        Situational depression -Started Celexa 2/16-increased to 20 mg 2/18 and given increase in depression likely related to significant other's diagnosis of breast cancer on 3/25 will increase Celexa to 40 mg  Ischemic cardiomyopathy -Echo this admission EF 40% -Given difficulty taking oral medications consistently have opted to transition to once daily dosing as follows: Change Coreg to Toprol-XL 12.5 daily Discontinue hydralazine Changed 3 times daily Isordil to Imdur  30 mg daily -No ACE inhibitor secondary to underlying kidney disease -Follow-up echocardiogram done March 2022 demonstrates an EF of 40 to 45% with LVH and grade 1 diastolic dysfunction physiology  Nutrition Status: Dysphagia with moderate protein calorie malnutrition Nutrition Problem: Increased nutrient needs Etiology: chronic illness (CKD3b) Signs/Symptoms: estimated needs Interventions: Ensure Enlive (each supplement provides 350kcal and 20 grams of protein),MVI,Magic cup,Prostat,Refer to RD note for recommendations PEG tube discontinued on 3/23  Tolerating D3 diet Continue Ensure protein beverages twice daily between meals;  Estimated body mass index is 25.5 kg/m as  calculated from the following:   Height as of this encounter: 6' (1.829 m).   Weight as of this encounter: 85.3 kg.  Hypertension -Was on Norvasc prior to admission -Continue cardiovascular meds as listed above  Acute on chronic kidney disease stage IIIb -Suspect prerenal etiology in the setting of hypoperfusion from ventricular fibrillation arrest -Baseline creatinine between 1.6 and 2-creatinine as of 3/14 stable at 1.33    Other problems: Acute hypernatremia -Continue free water per tube  E. coli UTI -Has completed 5 days of Bactrim  Hyperglycemia without an underlying diagnosis of diabetes mellitus Resolved -Likely related to acute stress -Hemoglobin A1c 4.7   Acute hypoxemic respiratory failure -Resolved -Stable on room air with O2 sats between 97 and 99%  Nonsustained VT -Continue amiodarone and beta-blocker -3/26 LFTs normal  Skin tear right lateral back/Rash Wound / Incision (Open or Dehisced) 07/28/20 Skin tear Back Lateral;Right;Upper (Active)  Date First Assessed/Time First Assessed: 07/28/20 0300   Wound Type: Skin tear  Location: Back  Location Orientation: Lateral;Right;Upper  Present on Admission: No    Assessments 07/28/2020  7:49 PM 11/15/2020 10:00 AM  Dressing Type None --  Wound Length (cm) -- 0 cm  Wound Width (cm) -- 0 cm  Wound Depth (cm) -- 0 cm  Wound Volume (cm^3) -- 0 cm^3  Wound Surface Area (cm^2) -- 0 cm^2  Drainage Amount None --  Treatment Cleansed --     No Linked orders to display   -Rash resolved   Data Reviewed: Basic Metabolic Panel: Recent Labs  Lab 11/18/20 0435  NA 137  K 3.8  CL 105  CO2 22  GLUCOSE 95  BUN 11  CREATININE 1.27*  CALCIUM 9.1   Liver Function Tests: Recent Labs  Lab 11/18/20 0435  AST 19  ALT 32  ALKPHOS 71  BILITOT 1.0  PROT 7.1  ALBUMIN 3.3*   No results for input(s): LIPASE, AMYLASE in the last 168 hours. No results for input(s): AMMONIA in the last 168 hours. CBC: Recent Labs   Lab 11/18/20 0435  WBC 6.8  NEUTROABS 2.7  HGB 13.1  HCT 40.6  MCV 94.2  PLT 174   Cardiac Enzymes: No results for input(s): CKTOTAL, CKMB, CKMBINDEX, TROPONINI in the last 168 hours. BNP (last 3 results) No results for input(s): BNP in the last 8760 hours.  ProBNP (last 3 results) No results for input(s): PROBNP in the last 8760 hours.  CBG: No results for input(s): GLUCAP in the last 168 hours.  No results found for this or any previous visit (from the past 240 hour(s)).   Studies: No results found.  Scheduled Meds: . aspirin  81 mg Oral Daily  . atorvastatin  80 mg Oral Daily  . chlorhexidine  15 mL Mouth Rinse BID  . citalopram  40 mg Oral Daily  . clopidogrel  75 mg Oral Daily  . enoxaparin (LOVENOX) injection  40 mg Subcutaneous Q24H  .  feeding supplement  237 mL Oral BID BM  . isosorbide mononitrate  30 mg Oral Daily  . mouth rinse  15 mL Mouth Rinse BID  . metoprolol succinate  12.5 mg Oral Daily  . multivitamin with minerals  1 tablet Oral Daily  . polyvinyl alcohol  1 drop Both Eyes QID  . QUEtiapine  25 mg Oral QHS  . sodium chloride flush  3 mL Intravenous Q12H   Continuous Infusions:   Active Problems:   Acute ST elevation myocardial infarction (STEMI) (HCC)   Cardiac arrest (HCC)   Elevated blood pressure reading with diagnosis of hypertension   Shock circulatory (HCC)   Encephalopathy   History of ETT   Cardiomyopathy, ischemic   Anoxic brain damage ([place])   Dysphagia   DNR (do not resuscitate) discussion   Gait instability   E. coli UTI   Essential hypertension   Situational depression   Consultants:  Cardiology  Interventional radiology  PCCM  Neurology  Procedures:  Cardiac catheterization 12/4  Continuous EEG 12/4 and overnight EEG with video 12/5  Echocardiogram 12/5  EEG 12/9  Percutaneous PEG tube placement 12/30  Antibiotics: Anti-infectives (From admission, onward)   Start     Dose/Rate Route Frequency  Ordered Stop   10/14/20 1000  fluconazole (DIFLUCAN) tablet 100 mg        100 mg Oral Daily 10/14/20 0848 10/18/20 0932   09/13/20 1000  sulfamethoxazole-trimethoprim (BACTRIM) 200-40 MG/5ML suspension 20 mL        20 mL Per Tube Every 12 hours 09/13/20 0734 09/19/20 0926   09/13/20 0745  cefTRIAXone (ROCEPHIN) 1 g in sodium chloride 0.9 % 100 mL IVPB  Status:  Discontinued        1 g 200 mL/hr over 30 Minutes Intravenous Every 24 hours 09/13/20 0658 09/13/20 0734   08/17/20 0000  ceFAZolin (ANCEF) IVPB 2g/100 mL premix        2 g 200 mL/hr over 30 Minutes Intravenous To Radiology 08/15/20 1346 08/17/20 1111   07/22/20 0830  cefTRIAXone (ROCEPHIN) 2 g in sodium chloride 0.9 % 100 mL IVPB        2 g 200 mL/hr over 30 Minutes Intravenous Every 24 hours 07/22/20 0720 07/26/20 0858       Time spent: 25 minutes    Bonnell Public ANP  [facility] Please refer to Amion for contact information 119 days

## 2020-11-19 NOTE — Progress Notes (Signed)
[facility] PROGRESS NOTE  Antonio Navarro DGU:[id number] DOB: 06-15-1961 DOA: 07/22/2020 PCP: Patient, No Pcp Per (Inactive)       Status: Remains inpatient appropriate because:Altered mental status and Unsafe d/c plan   Dispo: The patient is from: Home              Anticipated d/c is to: SNF              Anticipated d/c date is: > 3 days              Patient currently is medically stable to d/c.  Barriers to discharge: Medicaid and disability pending. No SNF bed offers.  No further nocturnal agitation or wandering behavior so hopeful will be eligible for SNF bed offer.  Improving from a PT standpoint.  May be a candidate for a group home especially once Medicaid is activated.  TOC will explore options.   Code Status: Full Family Communication: 4/1 updated sister [name] by telephone DVT prophylaxis: sq heparin Vaccination status: Completed both doses of Pfizer Covid vaccination on 12/14/2019; Pfizer booster ordered to be given on 3/3   Foley catheter: No  HPI: 60 year old male history of hypertension prescribed Norvasc prior to admission, chronic back pain, prior renal calculi who experienced an out of hospital cardiac arrest with downtime of 9 minutes.  Initial rhythm was ventricular fibrillation.  He was defibrillated deceived 6 doses of epi before ROSC.  Subsequent work-up revealed STEMI.  He underwent cardiac catheterization with a DES placed to the circumflex.  Cardiology following and has placed patient on Brilinta and aspirin and is also being treated with beta-blocker and statins.  From a neurological standpoint he clinically has anoxic brain injury.  During this hospitalization he experienced AKI and was treated with fluids and correction of hypoperfusion from cardiac arrest.  Echo performed this admission revealed ischemic cardiomyopathy with an EF of 40%  In addition to above patient has been unable to pass swallowing evaluations and subsequently has undergone  percutaneous PEG tube placement on 12/30.  Patient is awake and moving all extremities spontaneously but does not follow commands and does not verbalize palliative care has met with patient and family and has emphasized poor prognosis and will continue to meet with the family during the hospitalization.  Current plan is to transition to SNF bed once available.  Expect need for lifelong 24/7 care for all ADLs.  11/18/2020: Patient seen alongside patient's partner.  Patient symptoms started today.  No changes.  Pursue disposition when feasible. 11/19/2020: Patient seen.  No new changes.  Subjective: No significant history from patient.  Objective: Vitals:   11/19/20 0429 11/19/20 1130  BP: (!) 146/94 133/79  Pulse: 72 89  Resp: 18 20  Temp: (!) 97.5 F (36.4 C)   SpO2: 98% 98%    Intake/Output Summary (Last 24 hours) at 11/19/2020 1548 Last data filed at 11/19/2020 1500 Gross per 24 hour  Intake 720 ml  Output --  Net 720 ml   Filed Weights   11/17/20 0500 11/18/20 0410 11/19/20 0429  Weight: 83.4 kg 85.3 kg 85.3 kg    Exam: Constitutional: Awakened, calm, no acute distress.  Patient actually seems happy today. Heart: S1-S2.   Respiratory: Clear to auscultation.  Abdomen: Soft and nontender.  Organs are not palpable.   Neurologic: Awake and alert.  Patient moves all extremities.  Extremities: No leg edema.    Assessment/Plan: Acute problems: Out of hospital cardiac arrest secondary to STEMI -ROSC and subsequent cardiac  catheterization revealed dz in circumflex requiring DES -Continue beta-blocker, statin, Plavix, and Imdur  Significant anoxic brain injury secondary to cardiac arrest/deconditioning/gait instability -Consistently participating with PT and OT.  SLP has cleared for dysphagia 2 with thin liquids -Declined by CIR since family unable to provide 24/7 care.  Plan is for SNF -3/25 changed Seroquel to bedtime dosing only 25 mg -3/25 discontinued Symmetrel  **PT NOTES  FROM 3/30**  Pt tolerates treatment well but continues to require significant motivation from therapist and staff to progress mobility. Pt with limited awareness of deficits and safety. Pt continues to require cues to facilitate anterior trunk lean during transfers. Pt remains very easily distracted during session and demonstrates poor retention between sessions which is a limiting factor in progress. Pt will benefit from continued acute PT POC to reduce falls risk. PT continues to recommend SNF placement.    SLP notes 3/28:   Pt demonstrated prolonged mastication but good oral clearance with PO trials today. He benefited from liquid washes to ensure his oral cavity was completely clear especially with recent upgrade in solids to DYS 3. Recommend continuation of his current diet with full supervision to check for oral holding of solids and to initiate PO intake. Pt also benefited from cueing for repetition to communicate his wants/needs especially when branching up to two-word phrases. SLP will continue to follow for dysphagia and cog-ling tx.    OT notes 3/24:  Patient admitted 110 days ago for the above diagnosis.  During this time Cleave has made progress with his mobility, and certain aspects of his self care.  Unfortunately, OT believes he is most likely at his new cognitive baseline, and it is not expected, he will progress much beyond his current level for bathing, dressing, toileting and self feeding.  Over the past treatments, he has shown inconsistency in physical performance of ADL mainly due to continued balance deficits, and cognitive deficits.  Nishawn will need 24 hour minimal assist, supervision, and cueing for most, if not all, functional tasks.  He needs cues to initiate tasks, and cues to sequence through tasks to complete them, and this is not anticipated to improve.  This OT had conversations with NT and RN that are familiar with [name].  We discussed trying to establish a routine for  [facility]: consistent awake times, times in the chair, rest times, ADL times, and a toileting schedule.  Both NT and RN verbalized they try to accomplish this, but as described above, inconsistencies with behavior, cooperation and cognitive capacity are the barriers.  OT continues to recommend 24 hour assist as needed, SNF is an option, but home with family and a toileting plan, could be considered if 24/7 is possible.  Of note: patient's diet was downgraded to full liquid 3/23 due to pocketing of solids, whereas earlier in the week he ate meat and potatoes with utensils and supervision without assist.  Please re-consult OT if definitive, documented improvements in cognition impacting ADL are noted.  Karee would benefit from a care planned routine that is followed consistently.        Situational depression -Started Celexa 2/16-increased to 20 mg 2/18 and given increase in depression likely related to significant other's diagnosis of breast cancer on 3/25 will increase Celexa to 40 mg  Ischemic cardiomyopathy -Echo this admission EF 40% -Given difficulty taking oral medications consistently have opted to transition to once daily dosing as follows: Change Coreg to Toprol-XL 12.5 daily Discontinue hydralazine Changed 3 times daily Isordil to Imdur  30 mg daily -No ACE inhibitor secondary to underlying kidney disease -Follow-up echocardiogram done March 2022 demonstrates an EF of 40 to 45% with LVH and grade 1 diastolic dysfunction physiology  Nutrition Status: Dysphagia with moderate protein calorie malnutrition Nutrition Problem: Increased nutrient needs Etiology: chronic illness (CKD3b) Signs/Symptoms: estimated needs Interventions: Ensure Enlive (each supplement provides 350kcal and 20 grams of protein),MVI,Magic cup,Prostat,Refer to RD note for recommendations PEG tube discontinued on 3/23  Tolerating D3 diet Continue Ensure protein beverages twice daily between meals;  Estimated body mass  index is 25.5 kg/m as calculated from the following:   Height as of this encounter: 6' (1.829 m).   Weight as of this encounter: 85.3 kg.  Hypertension -Was on Norvasc prior to admission -Continue cardiovascular meds as listed above  Acute on chronic kidney disease stage IIIb -Suspect prerenal etiology in the setting of hypoperfusion from ventricular fibrillation arrest -Baseline creatinine between 1.6 and 2-creatinine as of 3/14 stable at 1.33    Other problems: Acute hypernatremia -Continue free water per tube  E. coli UTI -Has completed 5 days of Bactrim  Hyperglycemia without an underlying diagnosis of diabetes mellitus Resolved -Likely related to acute stress -Hemoglobin A1c 4.7   Acute hypoxemic respiratory failure -Resolved -Stable on room air with O2 sats between 97 and 99%  Nonsustained VT -Continue amiodarone and beta-blocker -3/26 LFTs normal  Skin tear right lateral back/Rash Wound / Incision (Open or Dehisced) 07/28/20 Skin tear Back Lateral;Right;Upper (Active)  Date First Assessed/Time First Assessed: 07/28/20 0300   Wound Type: Skin tear  Location: Back  Location Orientation: Lateral;Right;Upper  Present on Admission: No    Assessments 07/28/2020  7:49 PM 11/19/2020  8:00 AM  Dressing Type None None  Dressing Status -- Dry;Intact  Dressing Change Frequency -- PRN  Site / Wound Assessment -- Clean;Dry  Drainage Amount None --  Treatment Cleansed --     No Linked orders to display   -Rash resolved   Data Reviewed: Basic Metabolic Panel: Recent Labs  Lab 11/18/20 0435  NA 137  K 3.8  CL 105  CO2 22  GLUCOSE 95  BUN 11  CREATININE 1.27*  CALCIUM 9.1   Liver Function Tests: Recent Labs  Lab 11/18/20 0435  AST 19  ALT 32  ALKPHOS 71  BILITOT 1.0  PROT 7.1  ALBUMIN 3.3*   No results for input(s): LIPASE, AMYLASE in the last 168 hours. No results for input(s): AMMONIA in the last 168 hours. CBC: Recent Labs  Lab 11/18/20 0435   WBC 6.8  NEUTROABS 2.7  HGB 13.1  HCT 40.6  MCV 94.2  PLT 174   Cardiac Enzymes: No results for input(s): CKTOTAL, CKMB, CKMBINDEX, TROPONINI in the last 168 hours. BNP (last 3 results) No results for input(s): BNP in the last 8760 hours.  ProBNP (last 3 results) No results for input(s): PROBNP in the last 8760 hours.  CBG: No results for input(s): GLUCAP in the last 168 hours.  No results found for this or any previous visit (from the past 240 hour(s)).   Studies: No results found.  Scheduled Meds: . aspirin  81 mg Oral Daily  . atorvastatin  80 mg Oral Daily  . chlorhexidine  15 mL Mouth Rinse BID  . citalopram  40 mg Oral Daily  . clopidogrel  75 mg Oral Daily  . enoxaparin (LOVENOX) injection  40 mg Subcutaneous Q24H  . feeding supplement  237 mL Oral BID BM  . isosorbide mononitrate  30 mg Oral  Daily  . mouth rinse  15 mL Mouth Rinse BID  . metoprolol succinate  12.5 mg Oral Daily  . multivitamin with minerals  1 tablet Oral Daily  . polyvinyl alcohol  1 drop Both Eyes QID  . QUEtiapine  25 mg Oral QHS  . sodium chloride flush  3 mL Intravenous Q12H   Continuous Infusions:   Active Problems:   Acute ST elevation myocardial infarction (STEMI) (HCC)   Cardiac arrest (HCC)   Elevated blood pressure reading with diagnosis of hypertension   Shock circulatory (HCC)   Encephalopathy   History of ETT   Cardiomyopathy, ischemic   Anoxic brain damage ([place])   Dysphagia   DNR (do not resuscitate) discussion   Gait instability   E. coli UTI   Essential hypertension   Situational depression   Consultants:  Cardiology  Interventional radiology  PCCM  Neurology  Procedures:  Cardiac catheterization 12/4  Continuous EEG 12/4 and overnight EEG with video 12/5  Echocardiogram 12/5  EEG 12/9  Percutaneous PEG tube placement 12/30  Antibiotics: Anti-infectives (From admission, onward)   Start     Dose/Rate Route Frequency Ordered Stop   10/14/20  1000  fluconazole (DIFLUCAN) tablet 100 mg        100 mg Oral Daily 10/14/20 0848 10/18/20 0932   09/13/20 1000  sulfamethoxazole-trimethoprim (BACTRIM) 200-40 MG/5ML suspension 20 mL        20 mL Per Tube Every 12 hours 09/13/20 0734 09/19/20 0926   09/13/20 0745  cefTRIAXone (ROCEPHIN) 1 g in sodium chloride 0.9 % 100 mL IVPB  Status:  Discontinued        1 g 200 mL/hr over 30 Minutes Intravenous Every 24 hours 09/13/20 0658 09/13/20 0734   08/17/20 0000  ceFAZolin (ANCEF) IVPB 2g/100 mL premix        2 g 200 mL/hr over 30 Minutes Intravenous To Radiology 08/15/20 1346 08/17/20 1111   07/22/20 0830  cefTRIAXone (ROCEPHIN) 2 g in sodium chloride 0.9 % 100 mL IVPB        2 g 200 mL/hr over 30 Minutes Intravenous Every 24 hours 07/22/20 0720 07/26/20 0858       Time spent: 25 minutes    Bonnell Public ANP  [facility] Please refer to Amion for contact information 120 days

## 2020-11-20 NOTE — Progress Notes (Addendum)
[facility] PROGRESS NOTE  Antonio Navarro IWL:[id number] DOB: 1961-06-01 DOA: 07/22/2020 PCP: Patient, No Pcp Per (Inactive)       Status: Remains inpatient appropriate because:Altered mental status and Unsafe d/c plan   Dispo: The patient is from: Home              Anticipated d/c is to: SNF              Anticipated d/c date is: > 3 days              Patient currently is medically stable to d/c.  Barriers to discharge: Medicaid and disability pending. No SNF bed offers.  No further nocturnal agitation or wandering behavior so hopeful will be eligible for SNF bed offer.  Improving from a PT standpoint.  May be a candidate for a group home especially once Medicaid is activated.  TOC will explore options.   Code Status: Full Family Communication: 4/1 updated sister Antonio Navarro by telephone DVT prophylaxis: sq heparin Vaccination status: Completed both doses of Pfizer Covid vaccination on 12/14/2019; Pfizer booster ordered to be given on 3/3   Foley catheter: No  HPI: 60 year old male history of hypertension prescribed Norvasc prior to admission, chronic back pain, prior renal calculi who experienced an out of Navarro cardiac arrest with downtime of 9 minutes.  Initial rhythm was ventricular fibrillation.  He was defibrillated deceived 6 doses of epi before ROSC.  Subsequent work-up revealed STEMI.  He underwent cardiac catheterization with a DES placed to the circumflex.  Cardiology following and has placed patient on Brilinta and aspirin and is also being treated with beta-blocker and statins.  From a neurological standpoint he clinically has anoxic brain injury.  During this hospitalization he experienced AKI and was treated with fluids and correction of hypoperfusion from cardiac arrest.  Echo performed this admission revealed ischemic cardiomyopathy with an EF of 40%  In addition to above patient has been unable to pass swallowing evaluations and subsequently has undergone  percutaneous PEG tube placement on 12/30.  Patient is awake and moving all extremities spontaneously but does not follow commands and does not verbalize palliative care has met with patient and family and has emphasized poor prognosis and will continue to meet with the family during the hospitalization.  Current plan is to transition to SNF bed once available.  Expect need for lifelong 24/7 care for all ADLs.  Subjective: Sleeping.  Did awaken briefly but went back to sleep.  This is baseline for him he is not a morning person.  Objective: Vitals:   11/19/20 1936 11/20/20 0421  BP: (!) 157/95 (!) 179/93  Pulse: 94 80  Resp: 18 18  Temp: 98 F (36.7 C) 97.9 F (36.6 C)  SpO2: 96% 96%    Intake/Output Summary (Last 24 hours) at 11/20/2020 0745 Last data filed at 11/20/2020 0400 Gross per 24 hour  Intake 820 ml  Output 100 ml  Net 720 ml   Filed Weights   11/19/20 0429 11/20/20 0040 11/20/20 0500  Weight: 85.3 kg 85.6 kg 85.6 kg    Exam: Constitutional: Calm and in no acute distress Heart: Normotensive, no tachycardia, normal heart sounds. Respiratory: Lungs are clear, stable on room air Abdomen: LBM 4/3, soft nontender nondistended.  Normoactive bowel sounds. Neurologic: CN 2-12 intact, exam nonfocal.  Continues to require some assistance with mobility but can independently ambulate as well Psychiatric: Awake.  Definitely oriented to name.  Also aware he is in the Navarro.  Orientation difficult  to accurately assess due to degree of expressive aphasia.  Typically has pleasant jocular mood.    Assessment/Plan: Acute problems: Out of Navarro cardiac arrest secondary to STEMI -ROSC and subsequent cardiac catheterization revealed dz in circumflex requiring DES -Continue beta-blocker, statin, Plavix, and Imdur  Significant anoxic brain injury secondary to cardiac arrest/deconditioning/gait instability -Consistently participating with PT and OT.  SLP has cleared for dysphagia 2  with thin liquids -Declined by CIR since family unable to provide 24/7 care.  Plan is for SNF -3/25 changed Seroquel to bedtime dosing only 25 mg -3/25 discontinued Symmetrel  PT NOTES FROM 4/1  Pt tolerates treatment well and ambulates for limited community distances. Pt performance improved by arrival of fiance who helps to motivate. Pt is able to increase gait speed during session and demonstrates good balance when focused on task, however is easily distracted and  Loses balance once due to this. Pt will benefit from continued aggressive mobilization to improve mobility and reduce falls risk. PT continues to recommends SNF placement however the pt does continue to slowly improve. Will be beneficial to establish what tasks the pt needs to perform and at what assist level to discharge home safely.  SLP notes:   Pt consumed thin liquids and graham crackers with peanut butter without overt s/s of aspiration. Prolonged mastication was noted but no oral holding or residue was observed. Recommend continuation of his current diet and to check for oral holding with solids as this reported to still occur intermittently. Pt was also seen for cognitive-linguistic treatment in which he was noted to read a two-word phrase when prompted to do so by the SLP. However, when SLP wrote down phrase level commands he required Max verbal and visual cues to read and follow the commands even when SLP branched down to reading one word at a time. Pt followed one of these commands after he was given a verbal cue but only attempted one other with a model. SLP will continue to follow for dysphagia and cognitive-linguistic treatment.    OT notes 3/24:  Patient admitted 110 days ago for the above diagnosis.  During this time Antonio Navarro has made progress with his mobility, and certain aspects of his self care.  Unfortunately, OT believes he is most likely at his new cognitive baseline, and it is not expected, he will progress much beyond  his current level for bathing, dressing, toileting and self feeding.  Over the past treatments, he has shown inconsistency in physical performance of ADL mainly due to continued balance deficits, and cognitive deficits.  Antonio Navarro will need 24 hour minimal assist, supervision, and cueing for most, if not all, functional tasks.  He needs cues to initiate tasks, and cues to sequence through tasks to complete them, and this is not anticipated to improve.  This OT had conversations with NT and RN that are familiar with Antonio Navarro.  We discussed trying to establish a routine for Antonio Navarro: consistent awake times, times in the chair, rest times, ADL times, and a toileting schedule.  Both NT and RN verbalized they try to accomplish this, but as described above, inconsistencies with behavior, cooperation and cognitive capacity are the barriers.  OT continues to recommend 24 hour assist as needed, SNF is an option, but home with family and a toileting plan, could be considered if 24/7 is possible.  Of note: patient's diet was downgraded to full liquid 3/23 due to pocketing of solids, whereas earlier in the week he ate meat and potatoes with utensils  and supervision without assist.  Please re-consult OT if definitive, documented improvements in cognition impacting ADL are noted.  Antonio Navarro would benefit from a care planned routine that is followed consistently.        Situational depression -Started Celexa 2/16-increased to 20 mg 2/18 and given increase in depression likely related to significant other's diagnosis of breast cancer on 3/25 will increase Celexa to 40 mg  Ischemic cardiomyopathy -Echo this admission EF 40% -Given difficulty taking oral medications consistently have opted to transition to once daily dosing as follows: Change Coreg to Toprol-XL 12.5 daily Discontinue hydralazine Changed 3 times daily Isordil to Imdur 30 mg daily -No ACE inhibitor secondary to underlying kidney disease -Follow-up echocardiogram  done March 2022 demonstrates an EF of 40 to 45% with LVH and grade 1 diastolic dysfunction physiology  Nutrition Status: Dysphagia with moderate protein calorie malnutrition Nutrition Problem: Increased nutrient needs Etiology: chronic illness (CKD3b) Signs/Symptoms: estimated needs Interventions: Ensure Enlive (each supplement provides 350kcal and 20 grams of protein),MVI,Magic cup,Prostat,Refer to RD note for recommendations PEG tube discontinued on 3/23  Tolerating D3 diet Continue Ensure protein beverages twice daily between meals;  Estimated body mass index is 25.59 kg/m as calculated from the following:   Height as of this encounter: 6' (1.829 m).   Weight as of this encounter: 85.6 kg.  Hypertension -Was on Norvasc prior to admission -Continue cardiovascular meds as listed above  Acute on chronic kidney disease stage IIIb -Suspect prerenal etiology in the setting of hypoperfusion from ventricular fibrillation arrest -Baseline creatinine between 1.6 and 2-creatinine as of 3/14 stable at 1.33    Other problems: Acute hypernatremia -Continue free water per tube  E. coli UTI -Has completed 5 days of Bactrim  Hyperglycemia without an underlying diagnosis of diabetes mellitus Resolved -Likely related to acute stress -Hemoglobin A1c 4.7   Acute hypoxemic respiratory failure -Resolved -Stable on room air with O2 sats between 97 and 99%  Nonsustained VT -Continue amiodarone and beta-blocker -3/26 LFTs normal  Skin tear right lateral back/Rash Wound / Incision (Open or Dehisced) 07/28/20 Skin tear Back Lateral;Right;Upper (Active)  Date First Assessed/Time First Assessed: 07/28/20 0300   Wound Type: Skin tear  Location: Back  Location Orientation: Lateral;Right;Upper  Present on Admission: No    Assessments 07/28/2020  7:49 PM 11/19/2020  8:00 AM  Dressing Type None None  Dressing Status -- Dry;Intact  Dressing Change Frequency -- PRN  Site / Wound Assessment --  Clean;Dry  Drainage Amount None --  Treatment Cleansed --     No Linked orders to display   -Rash resolved   Data Reviewed: Basic Metabolic Panel: Recent Labs  Lab 11/18/20 0435  NA 137  K 3.8  CL 105  CO2 22  GLUCOSE 95  BUN 11  CREATININE 1.27*  CALCIUM 9.1   Liver Function Tests: Recent Labs  Lab 11/18/20 0435  AST 19  ALT 32  ALKPHOS 71  BILITOT 1.0  PROT 7.1  ALBUMIN 3.3*   No results for input(s): LIPASE, AMYLASE in the last 168 hours. No results for input(s): AMMONIA in the last 168 hours. CBC: Recent Labs  Lab 11/18/20 0435  WBC 6.8  NEUTROABS 2.7  HGB 13.1  HCT 40.6  MCV 94.2  PLT 174   Cardiac Enzymes: No results for input(s): CKTOTAL, CKMB, CKMBINDEX, TROPONINI in the last 168 hours. BNP (last 3 results) No results for input(s): BNP in the last 8760 hours.  ProBNP (last 3 results) No results for input(s): PROBNP in  the last 8760 hours.  CBG: No results for input(s): GLUCAP in the last 168 hours.  No results found for this or any previous visit (from the past 240 hour(s)).   Studies: No results found.  Scheduled Meds: . aspirin  81 mg Oral Daily  . atorvastatin  80 mg Oral Daily  . chlorhexidine  15 mL Mouth Rinse BID  . citalopram  40 mg Oral Daily  . clopidogrel  75 mg Oral Daily  . enoxaparin (LOVENOX) injection  40 mg Subcutaneous Q24H  . feeding supplement  237 mL Oral BID BM  . isosorbide mononitrate  30 mg Oral Daily  . mouth rinse  15 mL Mouth Rinse BID  . metoprolol succinate  12.5 mg Oral Daily  . multivitamin with minerals  1 tablet Oral Daily  . polyvinyl alcohol  1 drop Both Eyes QID  . QUEtiapine  25 mg Oral QHS  . sodium chloride flush  3 mL Intravenous Q12H   Continuous Infusions:   Active Problems:   Acute ST elevation myocardial infarction (STEMI) (HCC)   Cardiac arrest (HCC)   Elevated blood pressure reading with diagnosis of hypertension   Shock circulatory (HCC)   Encephalopathy   History of ETT    Cardiomyopathy, ischemic   Anoxic brain damage ([place])   Dysphagia   DNR (do not resuscitate) discussion   Gait instability   E. coli UTI   Essential hypertension   Situational depression   Consultants:  Cardiology  Interventional radiology  PCCM  Neurology  Procedures:  Cardiac catheterization 12/4  Continuous EEG 12/4 and overnight EEG with video 12/5  Echocardiogram 12/5  EEG 12/9  Percutaneous PEG tube placement 12/30  Antibiotics: Anti-infectives (From admission, onward)   Start     Dose/Rate Route Frequency Ordered Stop   10/14/20 1000  fluconazole (DIFLUCAN) tablet 100 mg        100 mg Oral Daily 10/14/20 0848 10/18/20 0932   09/13/20 1000  sulfamethoxazole-trimethoprim (BACTRIM) 200-40 MG/5ML suspension 20 mL        20 mL Per Tube Every 12 hours 09/13/20 0734 09/19/20 0926   09/13/20 0745  cefTRIAXone (ROCEPHIN) 1 g in sodium chloride 0.9 % 100 mL IVPB  Status:  Discontinued        1 g 200 mL/hr over 30 Minutes Intravenous Every 24 hours 09/13/20 0658 09/13/20 0734   08/17/20 0000  ceFAZolin (ANCEF) IVPB 2g/100 mL premix        2 g 200 mL/hr over 30 Minutes Intravenous To Radiology 08/15/20 1346 08/17/20 1111   07/22/20 0830  cefTRIAXone (ROCEPHIN) 2 g in sodium chloride 0.9 % 100 mL IVPB        2 g 200 mL/hr over 30 Minutes Intravenous Every 24 hours 07/22/20 0720 07/26/20 0858       Time spent: 20 minutes    Erin Hearing ANP  [facility] 7 am - 330 pm/M-F for direct patient care and secure chat Please refer to Amion for contact information 121 days  Addendum Patient seen and examined, awake but not interacting.  Chart reviewed, anoxic brain injury secondary to out-of-Navarro cardiac arrest, STEMI.  Continue management and plan per Ms. Lissa Merlin.    Estill Cotta M.D.  [facility] 11/20/2020, 3:02 PM

## 2020-11-20 NOTE — Progress Notes (Addendum)
Physical Therapy Treatment Patient Details Name: Antonio Navarro MRN: [id number] DOB: July 11, 1961 Today's Date: 11/20/2020    History of Present Illness Pt is a 60 y.o. male admitted 07/22/20 post-cardiac arrest with STEMI; found to be in V fib with resucitation by EMS in the field, approximately 30-min of CPR. ETT 12/4-12/13. MRI 12/7 suggestive of a widespread anoxic injury. S/p PEG tube 12/30. PEG removed 3/23. PMH includes tobacco abuse, HTN, chronic back pain.   PT Comments    Pt continues to progress with mobility. Today's session focused on transfer and gait training, as well as attempts to perform pericare ADL tasks. Pt requires modA to initiate standing, once upright, ambulatory with RW and min guard, requiring intermittent assist to prevent LOB. Pt with difficulty completing ADL task (posterior hygiene secondary to bowel incontinence) despite simple verbal cues for sequencing. Pt pleasant and agreeable even without fiance present to encourage mobility. Short-term acute PT goals updated based on pt's progress with functional mobility. Will continue to follow acutely.    Follow Up Recommendations  SNF;Supervision/Assistance - 24 hour (vs. ALF/group home)     Equipment Recommendations  Rolling walker with 5" wheels;3in1 (PT);Wheelchair (measurements PT);Wheelchair cushion (measurements PT)    Recommendations for Other Services       Precautions / Restrictions Precautions Precautions: Fall;Other (comment) Precaution Comments: Bladder/bowel incontinence Restrictions Weight Bearing Restrictions: No    Mobility  Bed Mobility               General bed mobility comments: Received sitting in recliner    Transfers Overall transfer level: Needs assistance Equipment used: Rolling walker (2 wheeled) Transfers: Sit to/from Stand Sit to Stand: Mod assist         General transfer comment: Pt appears reluctant to initiate mobility, requiring modA for HHA to facilitate anterior  weight translation and elevate trunk  Ambulation/Gait Ambulation/Gait assistance: Min guard;Min assist Gait Distance (Feet): 220 Feet Assistive device: Rolling walker (2 wheeled) Gait Pattern/deviations: Step-through pattern;Decreased stride length;Drifts right/left;Narrow base of support Gait velocity: Decreased   General Gait Details: Slow, mildly unsteady gait with RW and min guard; pt visually distracted in hallway, but ultimately staying on task with occasional verbal cues; when asked, "How do we get back to your room?" pt states, "turn around" - difficulty navigating back to room, requiring max verbal cuing to find correct room; 3x minA to prevent LOB, seems related to attempting to navigate around object with RW or when BOS becomes narrow/scissors; pt also with some self-corrected instability   Stairs             Wheelchair Mobility    Modified Rankin (Stroke Patients Only)       Balance Overall balance assessment: Needs assistance Sitting-balance support: No upper extremity supported;Feet supported Sitting balance-Leahy Scale: Good     Standing balance support: Single extremity supported;Bilateral upper extremity supported Standing balance-Leahy Scale: Poor Standing balance comment: Reliant on at least single UE support; required assist for posterior hygiene (seems to be mix of cognition and poor balance)                            Cognition Arousal/Alertness: Awake/alert Behavior During Therapy: WFL for tasks assessed/performed Overall Cognitive Status: Impaired/Different from baseline Area of Impairment: Attention;Memory;Awareness;Problem solving;Rancho level;Following commands               Rancho Levels of Cognitive Functioning [place] Amigos Scales of Cognitive Functioning: Confused/appropriate   Current Attention  Level: Sustained Memory: Decreased recall of precautions;Decreased short-term memory Following Commands: Follows one step  commands consistently Safety/Judgement: Decreased awareness of safety;Decreased awareness of deficits Awareness: Intellectual Problem Solving: Slow processing;Requires verbal cues General Comments: Pt handed soapy washcloth with simple verbal cues to perform posterior hygiene, pt minimally reaching behind buttocks with washcloth but not completing command, instead attempting to sit back down      Exercises      General Comments        Pertinent Vitals/Pain Pain Assessment: No/denies pain Faces Pain Scale: No hurt Pain Intervention(s): Monitored during session    Home Living                      Prior Function            PT Goals (current goals can now be found in the care plan section) Acute Rehab PT Goals Patient Stated Goal: pt fiance would like him to get intensive rehab PT Goal Formulation: With family Time For Goal Achievement: 12/04/20 Potential to Achieve Goals: Fair Progress towards PT goals: Progressing toward goals    Frequency    Min 3X/week      PT Plan Current plan remains appropriate    Co-evaluation              AM-PAC PT "6 Clicks" Mobility   Outcome Measure  Help needed turning from your back to your side while in a flat bed without using bedrails?: A Little Help needed moving from lying on your back to sitting on the side of a flat bed without using bedrails?: A Little Help needed moving to and from a bed to a chair (including a wheelchair)?: A Little Help needed standing up from a chair using your arms (e.g., wheelchair or bedside chair)?: A Lot Help needed to walk in hospital room?: A Little Help needed climbing 3-5 steps with a railing? : A Lot 6 Click Score: 16    End of Session Equipment Utilized During Treatment: Gait belt Activity Tolerance: Patient tolerated treatment well Patient left: in chair;with call bell/phone within reach (posey chair alarm belt) Nurse Communication: Mobility status PT Visit Diagnosis: Other  abnormalities of gait and mobility (R26.89);Difficulty in walking, not elsewhere classified (R26.2)     Time: 2197-5883 PT Time Calculation (min) (ACUTE ONLY): 24 min  Charges:  $Gait Training: 8-22 mins $Therapeutic Activity: 8-22 mins                     Mabeline Caras, PT, DPT Acute Rehabilitation Services  Pager [phone] Office [place] 11/20/2020, 3:07 PM

## 2020-11-20 NOTE — TOC Progression Note (Signed)
Transition of Care [facility]) - Progression Note    Patient Details  Name: Antonio Navarro MRN: [id number] Date of Birth: 08-25-1960  Transition of Care [facility]) CM/SW [place], RN Phone Number: 11/20/2020, 10:34 AM  Clinical Narrative:    Case management called and left a message with Earlean Polka, MSW to explore availability of placement at [facility] facility in [place], [place].  Clinicals were emailed to H. Pamella Pert, MSW on Friday, 11/17/2020.  No current bed offers for placement at this time.  Medicaid application is still pending at this time.  MSW and CM will continue to follow the patient for SNF versus ALF placement.   Expected Discharge Plan: Skilled Nursing Facility Barriers to Discharge: Continued Medical Work up,No SNF bed  Expected Discharge Plan and Services Expected Discharge Plan: [place] arrangements for the past 2 months: Single Family Home                                       Social Determinants of Health (SDOH) Interventions    Readmission Risk Interventions No flowsheet data found.

## 2020-11-21 NOTE — Progress Notes (Signed)
CSW spoke with Antonio Navarro at [facility] - she is willing to give referral to DON for review.  [name], MSW, LCSW Transitions of Care  Clinical Social Worker II [phone]

## 2020-11-21 NOTE — TOC Progression Note (Signed)
Transition of Care [facility]) - Progression Note    Patient Details  Name: Antonio Navarro MRN: [id number] Date of Birth: 05/10/1961  [facility]) CM/SW Contact  Curlene Labrum, RN Phone Number: 11/21/2020, 2:24 PM  Clinical Narrative:    CM and MSW with DTP Team is continuing to follow the patient for SNF placement with LOG support through [facility].  The patient was faxed out to [facility] in [place], and [place].  MSW and CM will continue to follow the patient for placement.   Expected Discharge Plan: Skilled Nursing Facility Barriers to Discharge: Continued Medical Work up,No SNF bed  Expected Discharge Plan and Services Expected Discharge Plan: [place] arrangements for the past 2 months: Single Family Home                                       Social Determinants of Health (SDOH) Interventions    Readmission Risk Interventions No flowsheet data found.

## 2020-11-21 NOTE — Progress Notes (Signed)
[facility] PROGRESS NOTE  Antonio Navarro YKZ:[id number] DOB: 06-Apr-1961 DOA: 07/22/2020 PCP: Patient, No Pcp Per (Inactive)       Status: Remains inpatient appropriate because:Altered mental status and Unsafe d/c plan   Dispo: The patient is from: Home              Anticipated d/c is to: SNF              Anticipated d/c date is: > 3 days              Patient currently is medically stable to d/c.  Barriers to discharge: Medicaid and disability pending. No SNF bed offers.  No further nocturnal agitation or wandering behavior so hopeful will be eligible for SNF bed offer.  Patient able to ambulate 300 feet so hopefully he can eventually discharge home with family as long as they can provide appropriate 24/7 monitoring.  On 2/28 TOC contacted at [facility]   Code Status: Full Family Communication: 3/22 updated Sister Antonio Navarro by telephone DVT prophylaxis: sq heparin Vaccination status: Completed both doses of Pfizer Covid vaccination on 12/14/2019; [place] booster ordered to be given on 3/3   Foley catheter: No  HPI: 60 year old male history of hypertension prescribed Norvasc prior to admission, chronic back pain, prior renal calculi who experienced an out of hospital cardiac arrest with downtime of 9 minutes.  Initial rhythm was ventricular fibrillation.  He was defibrillated deceived 6 doses of epi before ROSC.  Subsequent work-up revealed STEMI.  He underwent cardiac catheterization with a DES placed to the circumflex.  Cardiology following and has placed patient on Brilinta and aspirin and is also being treated with beta-blocker and statins.  From a neurological standpoint he clinically has anoxic brain injury.  During this hospitalization he experienced AKI and was treated with fluids and correction of hypoperfusion from cardiac arrest.  Echo performed this admission revealed ischemic cardiomyopathy with an EF of 40%  In addition to above patient has been unable  to pass swallowing evaluations and subsequently has undergone percutaneous PEG tube placement on 12/30.  Patient is awake and moving all extremities spontaneously but does not follow commands and does not verbalize palliative care has met with patient and family and has emphasized poor prognosis and will continue to meet with the family during the hospitalization.  Current plan is to transition to SNF bed once available.  Expect need for lifelong 24/7 care for all ADLs.  Subjective: Awake.  Sitting up in bed.  Nursing states she had just fed patient.  Question patient as to why he was not feeding himself.  He smiled broadly.  Objective: Vitals:   11/20/20 2024 11/21/20 0533  BP: (!) 126/100 (!) 142/96  Pulse: 96 87  Resp: 18 18  Temp: 98.8 F (37.1 C) 97.8 F (36.6 C)  SpO2: 96% 99%    Intake/Output Summary (Last 24 hours) at 11/21/2020 0802 Last data filed at 11/21/2020 0300 Gross per 24 hour  Intake 540 ml  Output --  Net 540 ml   Filed Weights   11/20/20 0040 11/20/20 0500 11/21/20 0533  Weight: 85.6 kg 85.6 kg 83.1 kg    Exam: Constitutional: Alert, calm, no acute distress Heart: S1-S2, no peripheral edema, no JVD, pulse regular Respiratory: Anterior lung sounds are clear to auscultation, remained stable on room air Abdomen: LBM 4/1, eating well but prefers to be fed instead of feeding himself.  Normoactive bowel sounds. Neurologic: CN 2-12 intact, still has significant issues with  dysarthria Able to ambulate independently. Psychiatric: Alert, oriented definitely to name.  Has significant dysarthria so unable to accurately assess orientation.  Pleasant affect.    Assessment/Plan: Acute problems: Out of hospital cardiac arrest secondary to STEMI -ROSC and subsequent cardiac catheterization revealed dz in circumflex requiring DES -Continue beta-blocker, statin, Plavix, and Imdur  Significant anoxic brain injury secondary to cardiac arrest/deconditioning/gait  instability -Consistently participating with PT and OT.  SLP has cleared for dysphagia 2 with thin liquids -Declined by CIR since family unable to provide 24/7 care.  Plan is for SNF -3/25 changed Seroquel to bedtime dosing only 25 mg -3/25 discontinued Symmetrel  -Discussed with nursing staff.  Plan is to provide patient with pictograph as well as pen and paper to write out responses. **PT NOTES FROM 3/30**  Pt tolerates treatment well but continues to require significant motivation from therapist and staff to progress mobility. Pt with limited awareness of deficits and safety. Pt continues to require cues to facilitate anterior trunk lean during transfers. Pt remains very easily distracted during session and demonstrates poor retention between sessions which is a limiting factor in progress. Pt will benefit from continued acute PT POC to reduce falls risk. PT continues to recommend SNF placement.    SLP notes 3/28:   Pt demonstrated prolonged mastication but good oral clearance with PO trials today. He benefited from liquid washes to ensure his oral cavity was completely clear especially with recent upgrade in solids to DYS 3. Recommend continuation of his current diet with full supervision to check for oral holding of solids and to initiate PO intake. Pt also benefited from cueing for repetition to communicate his wants/needs especially when branching up to two-word phrases. SLP will continue to follow for dysphagia and cog-ling tx.    OT notes 3/24:  Patient admitted 110 days ago for the above diagnosis.  During this time Tore has made progress with his mobility, and certain aspects of his self care.  Unfortunately, OT believes he is most likely at his new cognitive baseline, and it is not expected, he will progress much beyond his current level for bathing, dressing, toileting and self feeding.  Over the past treatments, he has shown inconsistency in physical performance of ADL mainly due to  continued balance deficits, and cognitive deficits.  Kennet will need 24 hour minimal assist, supervision, and cueing for most, if not all, functional tasks.  He needs cues to initiate tasks, and cues to sequence through tasks to complete them, and this is not anticipated to improve.  This OT had conversations with NT and RN that are familiar with [name].  We discussed trying to establish a routine for [facility]: consistent awake times, times in the chair, rest times, ADL times, and a toileting schedule.  Both NT and RN verbalized they try to accomplish this, but as described above, inconsistencies with behavior, cooperation and cognitive capacity are the barriers.  OT continues to recommend 24 hour assist as needed, SNF is an option, but home with family and a toileting plan, could be considered if 24/7 is possible.  Of note: patient's diet was downgraded to full liquid 3/23 due to pocketing of solids, whereas earlier in the week he ate meat and potatoes with utensils and supervision without assist.  Please re-consult OT if definitive, documented improvements in cognition impacting ADL are noted.  Youssef would benefit from a care planned routine that is followed consistently.        Situational depression -Started Celexa 2/16-increased to  20 mg 2/18 and given increase in depression likely related to significant other's diagnosis of breast cancer on 3/25 will increase Celexa to 40 mg  Ischemic cardiomyopathy -Echo this admission EF 40% -Given difficulty taking oral medications consistently have opted to transition to once daily dosing as follows: Change Coreg to Toprol-XL 12.5 daily Discontinue hydralazine Changed 3 times daily Isordil to Imdur 30 mg daily -No ACE inhibitor secondary to underlying kidney disease -Follow-up echocardiogram done March 2022 demonstrates an EF of 40 to 45% with LVH and grade 1 diastolic dysfunction physiology  Nutrition Status: Dysphagia with moderate protein calorie  malnutrition Nutrition Problem: Increased nutrient needs Etiology: chronic illness (CKD3b) Signs/Symptoms: estimated needs Interventions: Ensure Enlive (each supplement provides 350kcal and 20 grams of protein),MVI,Magic cup,Prostat,Refer to RD note for recommendations PEG tube discontinued on 3/23  Tolerating D3 diet Continue Ensure protein beverages twice daily between meals;  Estimated body mass index is 24.85 kg/m as calculated from the following:   Height as of this encounter: 6' (1.829 m).   Weight as of this encounter: 83.1 kg.  Hypertension -Was on Norvasc prior to admission -Continue cardiovascular meds as listed above  Acute on chronic kidney disease stage IIIb -Suspect prerenal etiology in the setting of hypoperfusion from ventricular fibrillation arrest -Baseline creatinine between 1.6 and 2-creatinine as of 3/14 stable at 1.33    Other problems: Acute hypernatremia -Continue free water per tube  E. coli UTI -Has completed 5 days of Bactrim  Hyperglycemia without an underlying diagnosis of diabetes mellitus Resolved -Likely related to acute stress -Hemoglobin A1c 4.7   Acute hypoxemic respiratory failure -Resolved -Stable on room air with O2 sats between 97 and 99%  Nonsustained VT -Continue amiodarone and beta-blocker -3/26 LFTs normal  Skin tear right lateral back/Rash Wound / Incision (Open or Dehisced) 07/28/20 Skin tear Back Lateral;Right;Upper (Active)  Date First Assessed/Time First Assessed: 07/28/20 0300   Wound Type: Skin tear  Location: Back  Location Orientation: Lateral;Right;Upper  Present on Admission: No    Assessments 07/28/2020  7:49 PM 11/19/2020  8:00 AM  Dressing Type None None  Dressing Status -- Dry;Intact  Dressing Change Frequency -- PRN  Site / Wound Assessment -- Clean;Dry  Drainage Amount None --  Treatment Cleansed --     No Linked orders to display   -Rash resolved   Data Reviewed: Basic Metabolic Panel: Recent Labs   Lab 11/18/20 0435  NA 137  K 3.8  CL 105  CO2 22  GLUCOSE 95  BUN 11  CREATININE 1.27*  CALCIUM 9.1   Liver Function Tests: Recent Labs  Lab 11/18/20 0435  AST 19  ALT 32  ALKPHOS 71  BILITOT 1.0  PROT 7.1  ALBUMIN 3.3*   No results for input(s): LIPASE, AMYLASE in the last 168 hours. No results for input(s): AMMONIA in the last 168 hours. CBC: Recent Labs  Lab 11/18/20 0435  WBC 6.8  NEUTROABS 2.7  HGB 13.1  HCT 40.6  MCV 94.2  PLT 174   Cardiac Enzymes: No results for input(s): CKTOTAL, CKMB, CKMBINDEX, TROPONINI in the last 168 hours. BNP (last 3 results) No results for input(s): BNP in the last 8760 hours.  ProBNP (last 3 results) No results for input(s): PROBNP in the last 8760 hours.  CBG: No results for input(s): GLUCAP in the last 168 hours.  No results found for this or any previous visit (from the past 240 hour(s)).   Studies: No results found.  Scheduled Meds: . aspirin  81  mg Oral Daily  . atorvastatin  80 mg Oral Daily  . chlorhexidine  15 mL Mouth Rinse BID  . citalopram  40 mg Oral Daily  . clopidogrel  75 mg Oral Daily  . enoxaparin (LOVENOX) injection  40 mg Subcutaneous Q24H  . feeding supplement  237 mL Oral BID BM  . isosorbide mononitrate  30 mg Oral Daily  . mouth rinse  15 mL Mouth Rinse BID  . metoprolol succinate  12.5 mg Oral Daily  . multivitamin with minerals  1 tablet Oral Daily  . polyvinyl alcohol  1 drop Both Eyes QID  . QUEtiapine  25 mg Oral QHS  . sodium chloride flush  3 mL Intravenous Q12H   Continuous Infusions:   Active Problems:   Acute ST elevation myocardial infarction (STEMI) (HCC)   Cardiac arrest (HCC)   Elevated blood pressure reading with diagnosis of hypertension   Shock circulatory (HCC)   Encephalopathy   History of ETT   Cardiomyopathy, ischemic   Anoxic brain damage ([place])   Dysphagia   DNR (do not resuscitate) discussion   Gait instability   E. coli UTI   Essential hypertension    Situational depression   Consultants:  Cardiology  Interventional radiology  PCCM  Neurology  Procedures:  Cardiac catheterization 12/4  Continuous EEG 12/4 and overnight EEG with video 12/5  Echocardiogram 12/5  EEG 12/9  Percutaneous PEG tube placement 12/30  Antibiotics: Anti-infectives (From admission, onward)   Start     Dose/Rate Route Frequency Ordered Stop   10/14/20 1000  fluconazole (DIFLUCAN) tablet 100 mg        100 mg Oral Daily 10/14/20 0848 10/18/20 0932   09/13/20 1000  sulfamethoxazole-trimethoprim (BACTRIM) 200-40 MG/5ML suspension 20 mL        20 mL Per Tube Every 12 hours 09/13/20 0734 09/19/20 0926   09/13/20 0745  cefTRIAXone (ROCEPHIN) 1 g in sodium chloride 0.9 % 100 mL IVPB  Status:  Discontinued        1 g 200 mL/hr over 30 Minutes Intravenous Every 24 hours 09/13/20 0658 09/13/20 0734   08/17/20 0000  ceFAZolin (ANCEF) IVPB 2g/100 mL premix        2 g 200 mL/hr over 30 Minutes Intravenous To Radiology 08/15/20 1346 08/17/20 1111   07/22/20 0830  cefTRIAXone (ROCEPHIN) 2 g in sodium chloride 0.9 % 100 mL IVPB        2 g 200 mL/hr over 30 Minutes Intravenous Every 24 hours 07/22/20 0720 07/26/20 0858       Time spent: 20 minutes    Erin Hearing ANP  [facility] 7 am - 330 pm/M-F for direct patient care and secure chat Please refer to Amion for contact information 122 days

## 2020-11-21 NOTE — Progress Notes (Signed)
Nutrition Follow-up  DOCUMENTATION CODES:   Not applicable  INTERVENTION:   -Continue MVI with minerals daily -ContinueEnsure Enlive po BID, each supplement provides 350 kcal and 20 grams of protein -ContinueMagic cup TID with meals, each supplement provides 290 kcal and 9 grams of protein -Continue feeding assistance with meals -Continue Hormel Shake BID with meals, each supplement provides 520 kcals and 22 grams protein -Due to medical stability, RD will sign off at this time. If further nutriton-related issues arise, please re-consult RD.   NUTRITION DIAGNOSIS:   Increased nutrient needs related to chronic illness (CKD3b) as evidenced by estimated needs.  Ongoing  GOAL:   Patient will meet greater than or equal to 90% of their needs  Progressing  MONITOR:   PO intake,Supplement acceptance,Skin,I & O's,Labs,Weight trends  REASON FOR ASSESSMENT:   Consult,Ventilator Enteral/tube feeding initiation and management  ASSESSMENT:   Patient with PMH significant for HTN and kidney stones. Presents this admission s/p cardiac arrest. Recent onset of CKD3b, dysphagia.  12/13- NGT d/c 12/15- cortrak placed, tip of tube in the stomach 12/17- s/p BSE- recommend continue NPO 12/19- pt pulled cortrak tube, replaced- placement verified by x-ray (stomach) 12/20- s/p BSE- pt refusing PO trials 12/30- cortrak removed, PEG placed 1/5- Advanced to thin liquid diet 1/6- Advanced to full liquid diet 1/11- Advanced to dysphagia 2 diet 1/14- transitioned to nocturnal feedings by MD 1/18- TF d/c by MD 1/24- calorie count completed- pt consuming about 50% of needs PO; nocturnal feedings resumed 3/10- TF d/c 3/23- g-tube removed 3/24- diet downgraded to full liquids per SLP 3/25- s/p BSE- advanced to dysphagia 3 diet with thin liquids  Reviewed I/O's: +540 ml x 24 hours and +2.6 L x 24 hours  Pt continues to tolerate dysphagia 3 diet with thin liquids well. He continues to receive  assistance from staff with meals. Pt habitually skips breakfast, but usually consume 100% of lunch and dinner. He is intermittently accepting of Ensure Enlive supplements.   Reviewed wt hx; wt has been stable over the past month.   4/1 Breakfast: nothing documented Lunch: 715 kcals, 25 grams protein Dinner: 599 kcals, 28 grams protein Supplements: 1 Ensure Emlive supplement (350 kcals, 20 grams protein)  Total intake: 1664 kcal (79% of minimum estimated needs)  73 grams protein (70% of minimum estimated needs)  4/2 Breakfast: nothing documented Lunch: 508 kcals, 23 grams protein Dinner: 834 kcals, 23 grams protein Supplements: refused Ensure ENlive  Total intake: 1342 kcal (64% of minimum estimated needs)  46 grams protein (44% of minimum estimated needs)  4/3 Breakfast: 741 kcals, 11 grams protein Lunch: nothing documented Dinner: nothing documented Supplements: refused Ensure Enlive  Total intake: 741 kcal (35% of minimum estimated needs)  11 grams protein (10% of minimum estimated needs)  4/4 Breakfast: nothing documented Lunch: 460 kcals, 24 grams protein Dinner: 1040 kcals, 32 grams protein Supplements: 1 Ensure Enlive supplement (350 kcals, 20 grams protein)  Total intake: 1850 kcal (88% of minimum estimated needs)  76 grams protein (72% of minimum estimated needs)  AverageTotal intake: 1339 kcal (67% of minimum estimated needs)  52 grams protein (49% of minimum estimated needs)  Pt medically stable for discharge. Pt continues to await placement (SNF vs ALF vs group home). Due to medical; stability, RD will sign off at this time. If further nutrition-related issues arise, please re-consult RD.   Labs reviewed: CBGS: 117-124.  Diet Order:   Diet Order  DIET DYS 3 Room service appropriate? Yes with Assist; Fluid consistency: Thin  Diet effective now                 EDUCATION NEEDS:   No education needs have been identified at this  time  Skin:  Skin Assessment: Skin Integrity Issues: Skin Integrity Issues:: Other (Comment) Other: Skin tear R upper back, L buttocks rash  Last BM:  11/11/20  Height:   Ht Readings from Last 1 Encounters:  09/03/20 6' (1.829 m)    Weight:   Wt Readings from Last 1 Encounters:  11/21/20 83.1 kg    Ideal Body Weight:  80.9 kg  BMI:  Body mass index is 24.85 kg/m.  Estimated Nutritional Needs:   Kcal:  2300-2500 kcal  Protein:  115-130 grams  Fluid:  >/= 2 L/day    Loistine Chance, RD, LDN, [place] Registered Dietitian II Certified Diabetes Care and Education Specialist Please refer to [facility] for RD and/or RD on-call/weekend/after hours pager

## 2020-11-22 DIAGNOSIS — R1010 Upper abdominal pain, unspecified: Secondary | ICD-10-CM

## 2020-11-22 DIAGNOSIS — R109 Unspecified abdominal pain: Secondary | ICD-10-CM

## 2020-11-22 NOTE — Progress Notes (Signed)
[facility] PROGRESS NOTE  Antonio Navarro UJW:[id number] DOB: [birth year] DOA: 07/22/2020 PCP: Patient, No Pcp Per (Inactive)       Status: Remains inpatient appropriate because:Altered mental status and Unsafe d/c plan   Dispo: The patient is from: Home              Anticipated d/c is to: SNF              Anticipated d/c date is: > 3 days              Patient currently is medically stable to d/c.  Barriers to discharge: Medicaid and disability pending. No SNF bed offers.  No further nocturnal agitation or wandering behavior so hopeful will be eligible for SNF bed offer.  Patient able to ambulate 300 feet so hopefully he can eventually discharge home with family as long as they can provide appropriate 24/7 monitoring.  On 2/28 TOC contacted at [facility]   Code Status: Full Family Communication: 3/22 updated Sister Antonio Navarro by telephone DVT prophylaxis: sq heparin Vaccination status: Completed both doses of Pfizer Covid vaccination on 12/14/2019; [place] booster ordered to be given on 3/3   Foley catheter: No  HPI: 60 year old male history of hypertension prescribed Norvasc prior to admission, chronic back pain, prior renal calculi who experienced an out of hospital cardiac arrest with downtime of 9 minutes.  Initial rhythm was ventricular fibrillation.  He was defibrillated deceived 6 doses of epi before ROSC.  Subsequent work-up revealed STEMI.  He underwent cardiac catheterization with a DES placed to the circumflex.  Cardiology following and has placed patient on Brilinta and aspirin and is also being treated with beta-blocker and statins.  From a neurological standpoint he clinically has anoxic brain injury.  During this hospitalization he experienced AKI and was treated with fluids and correction of hypoperfusion from cardiac arrest.  Echo performed this admission revealed ischemic cardiomyopathy with an EF of 40%  In addition to above patient has been unable  to pass swallowing evaluations and subsequently has undergone percutaneous PEG tube placement on 12/30.  Patient is awake and moving all extremities spontaneously but does not follow commands and does not verbalize palliative care has met with patient and family and has emphasized poor prognosis and will continue to meet with the family during the hospitalization.  Current plan is to transition to SNF bed once available.  Expect need for lifelong 24/7 care for all ADLs.  Subjective: Awake lying supine in bed.  Interactive.  Noticed he had not eaten any breakfast.  Instructed him that he needs to begin feeding himself.  Objective: Vitals:   11/21/20 2056 11/22/20 0359  BP: (!) 138/93 129/82  Pulse: 84 74  Resp: 14 19  Temp: 98.3 F (36.8 C) 99.3 F (37.4 C)  SpO2: 96% 100%    Intake/Output Summary (Last 24 hours) at 11/22/2020 0749 Last data filed at 11/22/2020 0300 Gross per 24 hour  Intake 650 ml  Output --  Net 650 ml   Filed Weights   11/20/20 0500 11/21/20 0533 11/22/20 0353  Weight: 85.6 kg 83.1 kg 85.7 kg    Exam: Constitutional: Awake, pleasant, no acute distress Heart: S1-S2, no JVD, no peripheral edema, pulse regular Respiratory: Anterior lung sounds clear, remained stable on room air, no dyspnea on exertion or orthopnea reported Abdomen: LBM 4/4, soft nontender.  Refuses to feed self.  Variable oral intake. Neurologic: CN 2-12 intact, still has significant issues with dysarthria Able to ambulate independently.  Psychiatric:    Assessment/Plan: Acute problems: Out of hospital cardiac arrest secondary to STEMI -ROSC and subsequent cardiac catheterization revealed dz in circumflex requiring DES -Continue beta-blocker, statin, Plavix, and Imdur  Significant anoxic brain injury secondary to cardiac arrest/deconditioning/gait instability -Consistently participating with PT and OT.  SLP has cleared for dysphagia 2 with thin liquids -Declined by CIR since family unable to  provide 24/7 care.  Plan is for SNF -3/25 changed Seroquel to bedtime dosing only 25 mg -3/25 discontinued Symmetrel  -Discussed with nursing staff.  Plan is to provide patient with pictograph as well as pen and paper to write out responses. PT NOTES FROM 4/4    Pt continues to progress with mobility. Today's session focused on transfer and gait training, as well as attempts to perform pericare ADL tasks. Pt requires modA to initiate standing, once upright, ambulatory with RW and min guard, requiring intermittent assist to prevent LOB. Pt with difficulty completing ADL task (posterior hygiene secondary to bowel incontinence) despite simple verbal cues for sequencing. Pt pleasant and agreeable even without fiance present to encourage mobility. Short-term acute PT goals updated based on pt's progress with functional mobility. Will continue to follow acutely.     SLP notes 4/1:   Pt consumed thin liquids and graham crackers with peanut butter without overt s/s of aspiration. Prolonged mastication was noted but no oral holding or residue was observed. Recommend continuation of his current diet and to check for oral holding with solids as this reported to still occur intermittently. Pt was also seen for cognitive-linguistic treatment in which he was noted to read a two-word phrase when prompted to do so by the SLP. However, when SLP wrote down phrase level commands he required Max verbal and visual cues to read and follow the commands even when SLP branched down to reading one word at a time. Pt followed one of these commands after he was given a verbal cue but only attempted one other with a model. SLP will continue to follow for dysphagia and cognitive-linguistic treatment.    OT notes 3/24:  Patient admitted 110 days ago for the above diagnosis.  During this time Antonio Navarro has made progress with his mobility, and certain aspects of his self care.  Unfortunately, OT believes he is most likely at his new  cognitive baseline, and it is not expected, he will progress much beyond his current level for bathing, dressing, toileting and self feeding.  Over the past treatments, he has shown inconsistency in physical performance of ADL mainly due to continued balance deficits, and cognitive deficits.  Donielle will need 24 hour minimal assist, supervision, and cueing for most, if not all, functional tasks.  He needs cues to initiate tasks, and cues to sequence through tasks to complete them, and this is not anticipated to improve.  This OT had conversations with NT and RN that are familiar with [name].  We discussed trying to establish a routine for [facility]: consistent awake times, times in the chair, rest times, ADL times, and a toileting schedule.  Both NT and RN verbalized they try to accomplish this, but as described above, inconsistencies with behavior, cooperation and cognitive capacity are the barriers.  OT continues to recommend 24 hour assist as needed, SNF is an option, but home with family and a toileting plan, could be considered if 24/7 is possible.  Of note: patient's diet was downgraded to full liquid 3/23 due to pocketing of solids, whereas earlier in the week he ate meat  and potatoes with utensils and supervision without assist.  Please re-consult OT if definitive, documented improvements in cognition impacting ADL are noted.  Isador would benefit from a care planned routine that is followed consistently.        Situational depression -Started Celexa 2/16-increased to 20 mg 2/18 and given increase in depression likely related to significant other's diagnosis of breast cancer on 3/25 will increase Celexa to 40 mg  Ischemic cardiomyopathy -Echo this admission EF 40% -Given difficulty taking oral medications consistently have opted to transition to once daily dosing as follows: Change Coreg to Toprol-XL 12.5 daily Discontinue hydralazine Changed 3 times daily Isordil to Imdur 30 mg daily -No ACE  inhibitor secondary to underlying kidney disease -Follow-up echocardiogram done March 2022 demonstrates an EF of 40 to 45% with LVH and grade 1 diastolic dysfunction physiology  Nutrition Status: Dysphagia with moderate protein calorie malnutrition Nutrition Problem: Increased nutrient needs Etiology: chronic illness (CKD3b) Signs/Symptoms: estimated needs Interventions: Ensure Enlive (each supplement provides 350kcal and 20 grams of protein),MVI,Magic cup,Prostat,Refer to RD note for recommendations PEG tube discontinued on 3/23  Tolerating D3 diet Continue Ensure protein beverages twice daily between meals;  Estimated body mass index is 25.62 kg/m as calculated from the following:   Height as of this encounter: 6' (1.829 m).   Weight as of this encounter: 85.7 kg.  Hypertension -Was on Norvasc prior to admission -Continue cardiovascular meds as listed above  Acute on chronic kidney disease stage IIIb -Suspect prerenal etiology in the setting of hypoperfusion from ventricular fibrillation arrest -Baseline creatinine between 1.6 and 2-creatinine as of 3/14 stable at 1.33    Other problems: Acute hypernatremia -Continue free water per tube  E. coli UTI -Has completed 5 days of Bactrim  Hyperglycemia without an underlying diagnosis of diabetes mellitus Resolved -Likely related to acute stress -Hemoglobin A1c 4.7   Acute hypoxemic respiratory failure -Resolved -Stable on room air with O2 sats between 97 and 99%  Nonsustained VT -Continue amiodarone and beta-blocker -3/26 LFTs normal  Skin tear right lateral back/Rash Wound / Incision (Open or Dehisced) 07/28/20 Skin tear Back Lateral;Right;Upper (Active)  Date First Assessed/Time First Assessed: 07/28/20 0300   Wound Type: Skin tear  Location: Back  Location Orientation: Lateral;Right;Upper  Present on Admission: No    Assessments 07/28/2020  7:49 PM 11/19/2020  8:00 AM  Dressing Type None None  Dressing Status --  Dry;Intact  Dressing Change Frequency -- PRN  Site / Wound Assessment -- Clean;Dry  Drainage Amount None --  Treatment Cleansed --     No Linked orders to display   -Rash resolved   Data Reviewed: Basic Metabolic Panel: Recent Labs  Lab 11/18/20 0435  NA 137  K 3.8  CL 105  CO2 22  GLUCOSE 95  BUN 11  CREATININE 1.27*  CALCIUM 9.1   Liver Function Tests: Recent Labs  Lab 11/18/20 0435  AST 19  ALT 32  ALKPHOS 71  BILITOT 1.0  PROT 7.1  ALBUMIN 3.3*   No results for input(s): LIPASE, AMYLASE in the last 168 hours. No results for input(s): AMMONIA in the last 168 hours. CBC: Recent Labs  Lab 11/18/20 0435  WBC 6.8  NEUTROABS 2.7  HGB 13.1  HCT 40.6  MCV 94.2  PLT 174   Cardiac Enzymes: No results for input(s): CKTOTAL, CKMB, CKMBINDEX, TROPONINI in the last 168 hours. BNP (last 3 results) No results for input(s): BNP in the last 8760 hours.  ProBNP (last 3 results) No results  for input(s): PROBNP in the last 8760 hours.  CBG: No results for input(s): GLUCAP in the last 168 hours.  No results found for this or any previous visit (from the past 240 hour(s)).   Studies: No results found.  Scheduled Meds: . aspirin  81 mg Oral Daily  . atorvastatin  80 mg Oral Daily  . chlorhexidine  15 mL Mouth Rinse BID  . citalopram  40 mg Oral Daily  . clopidogrel  75 mg Oral Daily  . enoxaparin (LOVENOX) injection  40 mg Subcutaneous Q24H  . feeding supplement  237 mL Oral BID BM  . isosorbide mononitrate  30 mg Oral Daily  . mouth rinse  15 mL Mouth Rinse BID  . metoprolol succinate  12.5 mg Oral Daily  . multivitamin with minerals  1 tablet Oral Daily  . polyvinyl alcohol  1 drop Both Eyes QID  . QUEtiapine  25 mg Oral QHS  . sodium chloride flush  3 mL Intravenous Q12H   Continuous Infusions:   Active Problems:   Acute ST elevation myocardial infarction (STEMI) (HCC)   Cardiac arrest (HCC)   Elevated blood pressure reading with diagnosis of  hypertension   Shock circulatory (HCC)   Encephalopathy   History of ETT   Cardiomyopathy, ischemic   Anoxic brain damage ([place])   Dysphagia   DNR (do not resuscitate) discussion   Gait instability   E. coli UTI   Essential hypertension   Situational depression   Consultants:  Cardiology  Interventional radiology  PCCM  Neurology  Procedures:  Cardiac catheterization 12/4  Continuous EEG 12/4 and overnight EEG with video 12/5  Echocardiogram 12/5  EEG 12/9  Percutaneous PEG tube placement 12/30  Antibiotics: Anti-infectives (From admission, onward)   Start     Dose/Rate Route Frequency Ordered Stop   10/14/20 1000  fluconazole (DIFLUCAN) tablet 100 mg        100 mg Oral Daily 10/14/20 0848 10/18/20 0932   09/13/20 1000  sulfamethoxazole-trimethoprim (BACTRIM) 200-40 MG/5ML suspension 20 mL        20 mL Per Tube Every 12 hours 09/13/20 0734 09/19/20 0926   09/13/20 0745  cefTRIAXone (ROCEPHIN) 1 g in sodium chloride 0.9 % 100 mL IVPB  Status:  Discontinued        1 g 200 mL/hr over 30 Minutes Intravenous Every 24 hours 09/13/20 0658 09/13/20 0734   08/17/20 0000  ceFAZolin (ANCEF) IVPB 2g/100 mL premix        2 g 200 mL/hr over 30 Minutes Intravenous To Radiology 08/15/20 1346 08/17/20 1111   07/22/20 0830  cefTRIAXone (ROCEPHIN) 2 g in sodium chloride 0.9 % 100 mL IVPB        2 g 200 mL/hr over 30 Minutes Intravenous Every 24 hours 07/22/20 0720 07/26/20 0858       Time spent: 20 minutes    Erin Hearing ANP  [facility] 7 am - 330 pm/M-F for direct patient care and secure chat Please refer to Amion for contact information 123 days

## 2020-11-22 NOTE — Plan of Care (Signed)
  Problem: Clinical Measurements: Goal: Ability to maintain clinical measurements within normal limits will improve Outcome: Progressing Goal: Will remain free from infection Outcome: Progressing Goal: Diagnostic test results will improve Outcome: Progressing Goal: Respiratory complications will improve Outcome: Progressing Goal: Cardiovascular complication will be avoided Outcome: Progressing   Problem: Nutrition: Goal: Adequate nutrition will be maintained Outcome: Progressing   Problem: Activity: Goal: Risk for activity intolerance will decrease Outcome: Progressing   

## 2020-11-22 NOTE — Progress Notes (Signed)
Physical Therapy Treatment Patient Details Name: Antonio Navarro MRN: [id number] DOB: 02-21-1961 Today's Date: 11/22/2020    History of Present Illness Pt is a 60 y.o. male admitted 07/22/20 post-cardiac arrest with STEMI; found to be in V fib with resucitation by EMS in the field; estimated time 30-min of CPR efforts. ETT 12/4-12/13. MRI 12/7 suggestive of a widespread anoxic injury. S/p PEG tube 12/30. PEG removed 3/23. PMH includes tobacco abuse, HTN, chronic back pain.    PT Comments    Pt with increased interaction, verbalizations and laughing with therapist this session. Received with bowel/urine incontinence and did not seem aware. Pt able to perform ADL tasks such as peri care and washing hands at sink with cueing. Ambulating x 380 feet with a walker at a min guard-min assist level. Tends to drift with environmental distractions and often tries to go in other patient's rooms. Worked on reading room numbers and locating, but pt still with difficulty identifying and navigating. Mobility is steadily improving; continue to recommend regular toileting schedule with staff to address incontinence.     Follow Up Recommendations  SNF;Supervision/Assistance - 24 hour     Equipment Recommendations  Rolling walker with 5" wheels;3in1 (PT);Wheelchair (measurements PT);Wheelchair cushion (measurements PT)    Recommendations for Other Services       Precautions / Restrictions Precautions Precautions: Fall;Other (comment) Precaution Comments: Bladder/bowel incontinence Restrictions Weight Bearing Restrictions: No    Mobility  Bed Mobility               General bed mobility comments: OOB in chair    Transfers Overall transfer level: Needs assistance Equipment used: Rolling walker (2 wheeled) Transfers: Sit to/from Stand Sit to Stand: Min assist;Min guard         General transfer comment: MinA to rise and steady from chair, min guard from  toilet  Ambulation/Gait Ambulation/Gait assistance: Min guard;Min assist Gait Distance (Feet): 380 Feet Assistive device: Rolling walker (2 wheeled);None Gait Pattern/deviations: Step-through pattern;Decreased stride length;Drifts right/left Gait velocity: decreased   General Gait Details: Improved wider BOS; requires minA with no AD and min guard with walker for stability. Tends to get distracted and drift in hallway, requires verbal cues or manual assist of walker to correct   Stairs             Wheelchair Mobility    Modified Rankin (Stroke Patients Only)       Balance Overall balance assessment: Needs assistance Sitting-balance support: No upper extremity supported;Feet supported Sitting balance-Leahy Scale: Good     Standing balance support: No upper extremity supported;During functional activity Standing balance-Leahy Scale: Poor                              Cognition Arousal/Alertness: Awake/alert Behavior During Therapy: WFL for tasks assessed/performed Overall Cognitive Status: Impaired/Different from baseline Area of Impairment: Attention;Memory;Awareness;Problem solving;[place] level;Following commands                 Orientation Level: Situation;Time;Disoriented to Current Attention Level: Sustained Memory: Decreased recall of precautions;Decreased short-term memory Following Commands: Follows one step commands consistently Safety/Judgement: Decreased awareness of safety;Decreased awareness of deficits Awareness: Intellectual Problem Solving: Slow processing;Requires verbal cues General Comments: Pt able to perform ADL tasks such as peri care and washing hands, but requires step by step cues. Does not recall month or room number. Increased verbalizations and laughing with therapist      Exercises      General  Comments        Pertinent Vitals/Pain Pain Assessment: Faces Faces Pain Scale: No hurt    Home Living                       Prior Function            PT Goals (current goals can now be found in the care plan section) Acute Rehab PT Goals Patient Stated Goal: pt fiance would like him to be able to verbalize when he needed to use the bathroom Potential to Achieve Goals: Fair Progress towards PT goals: Progressing toward goals    Frequency    Min 3X/week      PT Plan Current plan remains appropriate    Co-evaluation              AM-PAC PT "6 Clicks" Mobility   Outcome Measure  Help needed turning from your back to your side while in a flat bed without using bedrails?: A Little Help needed moving from lying on your back to sitting on the side of a flat bed without using bedrails?: A Little Help needed moving to and from a bed to a chair (including a wheelchair)?: A Little Help needed standing up from a chair using your arms (e.g., wheelchair or bedside chair)?: A Little Help needed to walk in hospital room?: A Little Help needed climbing 3-5 steps with a railing? : A Lot 6 Click Score: 17    End of Session Equipment Utilized During Treatment: Gait belt Activity Tolerance: Patient tolerated treatment well Patient left: in chair;with call bell/phone within reach Nurse Communication: Mobility status PT Visit Diagnosis: Other abnormalities of gait and mobility (R26.89);Difficulty in walking, not elsewhere classified (R26.2)     Time: 4888-9169 PT Time Calculation (min) (ACUTE ONLY): 24 min  Charges:  $Therapeutic Activity: 23-37 mins                     Wyona Almas, PT, DPT Acute Rehabilitation Services Pager [phone] Office [phone]    Deno Etienne 11/22/2020, 5:24 PM

## 2020-11-22 NOTE — Plan of Care (Signed)
  Problem: Health Behavior/Discharge Planning: Goal: Ability to manage health-related needs will improve Outcome: Progressing   Problem: Clinical Measurements: Goal: Ability to maintain clinical measurements within normal limits will improve Outcome: Progressing Goal: Will remain free from infection Outcome: Progressing Goal: Diagnostic test results will improve Outcome: Progressing Goal: Respiratory complications will improve Outcome: Progressing Goal: Cardiovascular complication will be avoided Outcome: Progressing   Problem: Nutrition: Goal: Adequate nutrition will be maintained Outcome: Progressing   Problem: Safety: Goal: Ability to remain free from injury will improve Outcome: Progressing   Problem: Skin Integrity: Goal: Risk for impaired skin integrity will decrease Outcome: Progressing   Problem: Neurologic: Goal: Promote progressive neurologic recovery Outcome: Progressing   Problem: Activity: Goal: Capacity to carry out activities will improve Outcome: Progressing

## 2020-11-23 DIAGNOSIS — R1012 Left upper quadrant pain: Secondary | ICD-10-CM

## 2020-11-23 NOTE — Plan of Care (Signed)
  Problem: Activity: Goal: Risk for activity intolerance will decrease Outcome: Progressing   Problem: Nutrition: Goal: Adequate nutrition will be maintained Outcome: Progressing   Problem: Coping: Goal: Level of anxiety will decrease Outcome: Progressing   

## 2020-11-23 NOTE — Progress Notes (Signed)
  Speech Language Pathology Treatment: Dysphagia;Cognitive-Linquistic  Patient Details Name: Antonio Navarro MRN: [id number] DOB: [birth year] Today's Date: 11/23/2020 Time: 6962-9528 SLP Time Calculation (min) (ACUTE ONLY): 22 min  Assessment / Plan / Recommendation Clinical Impression  Pt was seen for dysphagia and cognitive-linguistic tx. No overt s/s of aspiration were noted during trials of thin liquids and solids. He also demonstrated good oral clearance with no pocketing given extra time for mastication. Recommend continuation of his current DYS 3 diet with thin liquids. SLP also targeted expansion of pt's utterance as he continues to primarily use social greetings as well as head nods/shakes when communicating. Today he expanded his utterances to 3-4 word phrases given Mod-Max cues to express his wants/needs, make comments, and ask questions. He benefited from multimodal cueing such as fill in sentences and phonemic cues as well as repeating.    HPI HPI: Pt is a 60 yo male s/p arrest with downtime 9 minutes. Initial rhythm was V. fib, shocked and received 6 rounds of epi before ROSC achieved.  Total CPR time was 30 minutes. ETT 12/4-12/13. MRI 12/7 suggestive of a widespread anoxic injury. PMH includes kidney stones and HTN.      SLP Plan  Continue with current plan of care       Recommendations  Diet recommendations: Dysphagia 3 (mechanical soft);Thin liquid Liquids provided via: Cup;Straw Medication Administration: Crushed with puree Supervision: Staff to assist with self feeding;Full supervision/cueing for compensatory strategies Compensations: Slow rate;Small sips/bites;Minimize environmental distractions Postural Changes and/or Swallow Maneuvers: Seated upright 90 degrees                Oral Care Recommendations: Oral care BID Follow up Recommendations: Skilled Nursing facility SLP Visit Diagnosis: Dysphagia, unspecified (R13.10) Plan: Continue with current plan of  care       GO                Jeanine Luz., SLP Student 11/23/2020, 11:38 AM

## 2020-11-23 NOTE — Progress Notes (Addendum)
2pm: CSW sent clinical information to [place] at [facility] for review.  8am: CSW spoke with staff at [facility] - facility unable to offer patient a bed.  CSW spoke with Rip Harbour at [facility] in [place] - she is agreeable to review referral and complete asset check to determine if she can accept the patient as an LOG. Re  CSW spoke with Josh at [facility] in [place] - facility does not have a locked unit and the patient does wander so they cannot offer a bed.  [name], MSW, LCSW Transitions of Care  Clinical Social Worker II [phone]

## 2020-11-23 NOTE — Progress Notes (Signed)
[facility] PROGRESS NOTE  Antonio Navarro CBS:[id number] DOB: [birth year] DOA: 07/22/2020 PCP: Patient, No Pcp Per (Inactive)       Status: Remains inpatient appropriate because:Altered mental status and Unsafe d/c plan   Dispo: The patient is from: Home              Anticipated d/c is to: SNF              Anticipated d/c date is: > 3 days              Patient currently is medically stable to d/c.  Barriers to discharge: Medicaid and disability pending. No SNF bed offers.  No further nocturnal agitation or wandering behavior so hopeful will be eligible for SNF bed offer.  Patient able to ambulate 300 feet so hopefully he can eventually discharge home with family as long as they can provide appropriate 24/7 monitoring.  On 2/28 TOC contacted at [facility]   Code Status: Full Family Communication: 3/22 updated Sister Antonio Navarro by telephone DVT prophylaxis: sq heparin Vaccination status: Completed both doses of Pfizer Covid vaccination on 12/14/2019; [place] booster ordered to be given on 3/3   Foley catheter: No  HPI: 60 year old male history of hypertension prescribed Norvasc prior to admission, chronic back pain, prior renal calculi who experienced an out of hospital cardiac arrest with downtime of 9 minutes.  Initial rhythm was ventricular fibrillation.  He was defibrillated deceived 6 doses of epi before ROSC.  Subsequent work-up revealed STEMI.  He underwent cardiac catheterization with a DES placed to the circumflex.  Cardiology following and has placed patient on Brilinta and aspirin and is also being treated with beta-blocker and statins.  From a neurological standpoint he clinically has anoxic brain injury.  During this hospitalization he experienced AKI and was treated with fluids and correction of hypoperfusion from cardiac arrest.  Echo performed this admission revealed ischemic cardiomyopathy with an EF of 40%  In addition to above patient has been unable  to pass swallowing evaluations and subsequently has undergone percutaneous PEG tube placement on 12/30.  Patient is awake and moving all extremities spontaneously but does not follow commands and does not verbalize palliative care has met with patient and family and has emphasized poor prognosis and will continue to meet with the family during the hospitalization.  Current plan is to transition to SNF bed once available.  Expect need for lifelong 24/7 care for all ADLs.  Subjective: Sleeping initially but awakened upon my arrival to bedside.  When I asked him if he had eaten breakfast he shook his head yes.  When I asked him if he fed himself he did not respond with shaking of the head in either direction.  Objective: Vitals:   11/22/20 2030 11/23/20 0511  BP: (!) 161/86 (!) 132/93  Pulse:  84  Resp:  12  Temp:  97.8 F (36.6 C)  SpO2:  96%    Intake/Output Summary (Last 24 hours) at 11/23/2020 0814 Last data filed at 11/22/2020 2000 Gross per 24 hour  Intake 628 ml  Output 0 ml  Net 628 ml   Filed Weights   11/21/20 0533 11/22/20 0353 11/23/20 0200  Weight: 83.1 kg 85.7 kg 84.9 kg    Exam: Constitutional: Comfortable, no acute distress Heart: S1-S2, no JVD, no peripheral edema, regular pulse Respiratory: Posterior lung sounds are clear to auscultation, no increased work of breathing.  Room air Abdomen: LBM 4/6, soft and nontender.  Normoactive bowel sounds.  628 cc of oral fluids in the past 24 hours.  Percentage of meals ingested not documented Neurologic: CN 2-12 intact, still has significant issues with dysarthria Able to ambulate independently. Psychiatric:    Assessment/Plan: Acute problems: Out of hospital cardiac arrest secondary to STEMI -ROSC and subsequent cardiac catheterization revealed dz in circumflex requiring DES -Continue beta-blocker, statin, Plavix, and Imdur  Significant anoxic brain injury secondary to cardiac arrest/deconditioning/gait  instability -Consistently participating with PT and OT.  SLP has cleared for dysphagia 2 with thin liquids -Declined by CIR since family unable to provide 24/7 care.  Plan is for SNF -3/25 changed Seroquel to bedtime dosing only 25 mg -3/25 discontinued Symmetrel  -Discussed with nursing staff.  Plan is to provide patient with pictograph as well as pen and paper to write out responses. -Continues to have issues with bowel and bladder incontinence PT NOTES FROM 4/6  Pt with increased interaction, verbalizations and laughing with therapist this session. Received with bowel/urine incontinence and did not seem aware. Pt able to perform ADL tasks such as peri care and washing hands at sink with cueing. Ambulating x 380 feet with a walker at a min guard-min assist level. Tends to drift with environmental distractions and often tries to go in other patient's rooms. Worked on reading room numbers and locating, but pt still with difficulty identifying and navigating. Mobility is steadily improving; continue to recommend regular toileting schedule with staff to address incontinence.    SLP notes 4/1:   Pt consumed thin liquids and graham crackers with peanut butter without overt s/s of aspiration. Prolonged mastication was noted but no oral holding or residue was observed. Recommend continuation of his current diet and to check for oral holding with solids as this reported to still occur intermittently. Pt was also seen for cognitive-linguistic treatment in which he was noted to read a two-word phrase when prompted to do so by the SLP. However, when SLP wrote down phrase level commands he required Max verbal and visual cues to read and follow the commands even when SLP branched down to reading one word at a time. Pt followed one of these commands after he was given a verbal cue but only attempted one other with a model. SLP will continue to follow for dysphagia and cognitive-linguistic treatment.    OT notes  3/24:  Patient admitted 110 days ago for the above diagnosis.  During this time Antonio Navarro has made progress with his mobility, and certain aspects of his self care.  Unfortunately, OT believes he is most likely at his new cognitive baseline, and it is not expected, he will progress much beyond his current level for bathing, dressing, toileting and self feeding.  Over the past treatments, he has shown inconsistency in physical performance of ADL mainly due to continued balance deficits, and cognitive deficits.  Antonio Navarro will need 24 hour minimal assist, supervision, and cueing for most, if not all, functional tasks.  He needs cues to initiate tasks, and cues to sequence through tasks to complete them, and this is not anticipated to improve.  This OT had conversations with NT and RN that are familiar with Antonio Navarro.  We discussed trying to establish a routine for [facility]: consistent awake times, times in the chair, rest times, ADL times, and a toileting schedule.  Both NT and RN verbalized they try to accomplish this, but as described above, inconsistencies with behavior, cooperation and cognitive capacity are the barriers.  OT continues to recommend 24 hour assist as needed,  SNF is an option, but home with family and a toileting plan, could be considered if 24/7 is possible.  Of note: patient's diet was downgraded to full liquid 3/23 due to pocketing of solids, whereas earlier in the week he ate meat and potatoes with utensils and supervision without assist.  Please re-consult OT if definitive, documented improvements in cognition impacting ADL are noted.  Antonio Navarro would benefit from a care planned routine that is followed consistently.        Situational depression -Started Celexa 2/16-increased to 20 mg 2/18 and given increase in depression likely related to significant other's diagnosis of breast cancer on 3/25 will increase Celexa to 40 mg  Ischemic cardiomyopathy -Echo this admission EF 40% -Given difficulty  taking oral medications consistently have opted to transition to once daily dosing as follows: Change Coreg to Toprol-XL 12.5 daily Discontinue hydralazine Changed 3 times daily Isordil to Imdur 30 mg daily -No ACE inhibitor secondary to underlying kidney disease -Follow-up echocardiogram done March 2022 demonstrates an EF of 40 to 45% with LVH and grade 1 diastolic dysfunction physiology  Nutrition Status: Dysphagia with moderate protein calorie malnutrition Nutrition Problem: Increased nutrient needs Etiology: chronic illness (CKD3b) Signs/Symptoms: estimated needs Interventions: Ensure Enlive (each supplement provides 350kcal and 20 grams of protein),MVI,Magic cup,Prostat,Refer to RD note for recommendations PEG tube discontinued on 3/23  Tolerating D3 diet Continue Ensure protein beverages twice daily between meals;  Estimated body mass index is 25.38 kg/m as calculated from the following:   Height as of this encounter: 6' (1.829 m).   Weight as of this encounter: 84.9 kg.  Hypertension -Was on Norvasc prior to admission -Continue cardiovascular meds as listed above  Acute on chronic kidney disease stage IIIb -Suspect prerenal etiology in the setting of hypoperfusion from ventricular fibrillation arrest -Baseline creatinine between 1.6 and 2-creatinine as of 3/14 stable at 1.33    Other problems: Acute hypernatremia -Continue free water per tube  E. coli UTI -Has completed 5 days of Bactrim  Hyperglycemia without an underlying diagnosis of diabetes mellitus Resolved -Likely related to acute stress -Hemoglobin A1c 4.7   Acute hypoxemic respiratory failure -Resolved -Stable on room air with O2 sats between 97 and 99%  Nonsustained VT -Continue amiodarone and beta-blocker -3/26 LFTs normal  Skin tear right lateral back/Rash Wound / Incision (Open or Dehisced) 07/28/20 Skin tear Back Lateral;Right;Upper (Active)  Date First Assessed/Time First Assessed: 07/28/20  0300   Wound Type: Skin tear  Location: Back  Location Orientation: Lateral;Right;Upper  Present on Admission: No    Assessments 07/28/2020  7:49 PM 11/22/2020 10:00 AM  Dressing Type None --  Wound Length (cm) -- 0 cm  Wound Width (cm) -- 0 cm  Wound Depth (cm) -- 0 cm  Wound Volume (cm^3) -- 0 cm^3  Wound Surface Area (cm^2) -- 0 cm^2  Drainage Amount None --  Treatment Cleansed --     No Linked orders to display   -Rash resolved   Data Reviewed: Basic Metabolic Panel: Recent Labs  Lab 11/18/20 0435  NA 137  K 3.8  CL 105  CO2 22  GLUCOSE 95  BUN 11  CREATININE 1.27*  CALCIUM 9.1   Liver Function Tests: Recent Labs  Lab 11/18/20 0435  AST 19  ALT 32  ALKPHOS 71  BILITOT 1.0  PROT 7.1  ALBUMIN 3.3*   No results for input(s): LIPASE, AMYLASE in the last 168 hours. No results for input(s): AMMONIA in the last 168 hours. CBC: Recent Labs  Lab 11/18/20 0435  WBC 6.8  NEUTROABS 2.7  HGB 13.1  HCT 40.6  MCV 94.2  PLT 174   Cardiac Enzymes: No results for input(s): CKTOTAL, CKMB, CKMBINDEX, TROPONINI in the last 168 hours. BNP (last 3 results) No results for input(s): BNP in the last 8760 hours.  ProBNP (last 3 results) No results for input(s): PROBNP in the last 8760 hours.  CBG: No results for input(s): GLUCAP in the last 168 hours.  No results found for this or any previous visit (from the past 240 hour(s)).   Studies: No results found.  Scheduled Meds: . aspirin  81 mg Oral Daily  . atorvastatin  80 mg Oral Daily  . chlorhexidine  15 mL Mouth Rinse BID  . citalopram  40 mg Oral Daily  . clopidogrel  75 mg Oral Daily  . enoxaparin (LOVENOX) injection  40 mg Subcutaneous Q24H  . feeding supplement  237 mL Oral BID BM  . isosorbide mononitrate  30 mg Oral Daily  . mouth rinse  15 mL Mouth Rinse BID  . metoprolol succinate  12.5 mg Oral Daily  . multivitamin with minerals  1 tablet Oral Daily  . polyvinyl alcohol  1 drop Both Eyes QID  .  QUEtiapine  25 mg Oral QHS  . sodium chloride flush  3 mL Intravenous Q12H   Continuous Infusions:   Active Problems:   Acute ST elevation myocardial infarction (STEMI) (HCC)   Cardiac arrest (HCC)   Elevated blood pressure reading with diagnosis of hypertension   Shock circulatory (HCC)   Encephalopathy   History of ETT   Cardiomyopathy, ischemic   Anoxic brain damage ([place])   Dysphagia   DNR (do not resuscitate) discussion   Gait instability   E. coli UTI   Essential hypertension   Situational depression   Abdominal pain   Consultants:  Cardiology  Interventional radiology  PCCM  Neurology  Procedures:  Cardiac catheterization 12/4  Continuous EEG 12/4 and overnight EEG with video 12/5  Echocardiogram 12/5  EEG 12/9  Percutaneous PEG tube placement 12/30  Antibiotics: Anti-infectives (From admission, onward)   Start     Dose/Rate Route Frequency Ordered Stop   10/14/20 1000  fluconazole (DIFLUCAN) tablet 100 mg        100 mg Oral Daily 10/14/20 0848 10/18/20 0932   09/13/20 1000  sulfamethoxazole-trimethoprim (BACTRIM) 200-40 MG/5ML suspension 20 mL        20 mL Per Tube Every 12 hours 09/13/20 0734 09/19/20 0926   09/13/20 0745  cefTRIAXone (ROCEPHIN) 1 g in sodium chloride 0.9 % 100 mL IVPB  Status:  Discontinued        1 g 200 mL/hr over 30 Minutes Intravenous Every 24 hours 09/13/20 0658 09/13/20 0734   08/17/20 0000  ceFAZolin (ANCEF) IVPB 2g/100 mL premix        2 g 200 mL/hr over 30 Minutes Intravenous To Radiology 08/15/20 1346 08/17/20 1111   07/22/20 0830  cefTRIAXone (ROCEPHIN) 2 g in sodium chloride 0.9 % 100 mL IVPB        2 g 200 mL/hr over 30 Minutes Intravenous Every 24 hours 07/22/20 0720 07/26/20 0858       Time spent: 20 minutes    Erin Hearing ANP  [facility] 7 am - 330 pm/M-F for direct patient care and secure chat Please refer to Amion for contact information 124 days

## 2020-11-24 LAB — RESP PANEL BY RT-PCR (FLU A&B, COVID) ARPGX2
Influenza A by PCR: NEGATIVE
Influenza B by PCR: NEGATIVE
SARS Coronavirus 2 by RT PCR: NEGATIVE

## 2020-11-24 MED ORDER — CLOPIDOGREL BISULFATE 75 MG PO TABS: 75.0000 mg | ORAL_TABLET | Freq: Every day | ORAL | Status: DC

## 2020-11-24 MED ORDER — ADULT MULTIVITAMIN W/MINERALS CH: 1.0000 | ORAL_TABLET | Freq: Every day | ORAL | Status: DC

## 2020-11-24 MED ORDER — ISOSORBIDE MONONITRATE ER 30 MG PO TB24: 30.0000 mg | ORAL_TABLET | Freq: Every day | ORAL | Status: DC

## 2020-11-24 MED ORDER — ENSURE ENLIVE PO LIQD: 237.0000 mL | Freq: Two times a day (BID) | ORAL | 12 refills | Status: DC

## 2020-11-24 MED ORDER — ASPIRIN 81 MG PO CHEW: 81.0000 mg | CHEWABLE_TABLET | Freq: Every day | ORAL | Status: AC

## 2020-11-24 MED ORDER — POLYETHYLENE GLYCOL 3350 17 G PO PACK: 17.0000 g | PACK | Freq: Every day | ORAL | 0 refills | Status: AC | PRN

## 2020-11-24 MED ORDER — ATORVASTATIN CALCIUM 80 MG PO TABS: 80.0000 mg | ORAL_TABLET | Freq: Every day | ORAL | Status: DC

## 2020-11-24 MED ORDER — QUETIAPINE FUMARATE 25 MG PO TABS: 25.0000 mg | ORAL_TABLET | Freq: Every day | ORAL | Status: DC

## 2020-11-24 MED ORDER — CITALOPRAM HYDROBROMIDE 40 MG PO TABS: 40.0000 mg | ORAL_TABLET | Freq: Every day | ORAL | Status: DC

## 2020-11-24 MED ORDER — METOPROLOL SUCCINATE ER 25 MG PO TB24: 12.5000 mg | ORAL_TABLET | Freq: Every day | ORAL | Status: DC

## 2020-11-24 MED ORDER — ACETAMINOPHEN 325 MG PO TABS: 650.0000 mg | ORAL_TABLET | Freq: Four times a day (QID) | ORAL | Status: DC | PRN

## 2020-11-24 MED ORDER — DOCUSATE SODIUM 100 MG PO CAPS: 100.0000 mg | ORAL_CAPSULE | Freq: Two times a day (BID) | ORAL | 0 refills | Status: DC | PRN

## 2020-11-24 NOTE — Progress Notes (Addendum)
CSW received call from [place] at [facility] who states the facility can offer this patient a bed.  The patient will be transported via [facility] has been called. The number to call for report is [phone]. The patient will go to room [facility].  A rapid COVID test has been ordered by Dr. Tana Coast - RN aware of need to swab patient.  [name], MSW, LCSW Transitions of Care  Clinical Social Worker II [phone]

## 2020-11-24 NOTE — Plan of Care (Signed)

## 2020-11-24 NOTE — Progress Notes (Deleted)
[facility] PROGRESS NOTE  Antonio Navarro BJS:[id number] DOB: 1961-03-19 DOA: 07/22/2020 PCP: Patient, No Pcp Per (Inactive)       Status: Remains inpatient appropriate because:Altered mental status and Unsafe d/c plan   Dispo: The patient is from: Home              Anticipated d/c is to: SNF              Anticipated d/c date is: > 3 days              Patient currently is medically stable to d/c.  Barriers to discharge: Medicaid and disability pending. No SNF bed offers.  No further nocturnal agitation or wandering behavior so hopeful will be eligible for SNF bed offer.  Patient able to ambulate 300 feet so hopefully he can eventually discharge home with family as long as they can provide appropriate 24/7 monitoring.  On 2/28 TOC contacted at [facility]   Code Status: Full Family Communication: 3/22 updated Sister Antonio Navarro by telephone DVT prophylaxis: sq heparin Vaccination status: Completed both doses of Pfizer Covid vaccination on 12/14/2019; [place] booster ordered to be given on 3/3   Foley catheter: No  HPI: 60 year old male history of hypertension prescribed Norvasc prior to admission, chronic back pain, prior renal calculi who experienced an out of hospital cardiac arrest with downtime of 9 minutes.  Initial rhythm was ventricular fibrillation.  He was defibrillated deceived 6 doses of epi before ROSC.  Subsequent work-up revealed STEMI.  He underwent cardiac catheterization with a DES placed to the circumflex.  Cardiology following and has placed patient on Brilinta and aspirin and is also being treated with beta-blocker and statins.  From a neurological standpoint he clinically has anoxic brain injury.  During this hospitalization he experienced AKI and was treated with fluids and correction of hypoperfusion from cardiac arrest.  Echo performed this admission revealed ischemic cardiomyopathy with an EF of 40%  In addition to above patient has been unable  to pass swallowing evaluations and subsequently has undergone percutaneous PEG tube placement on 12/30.  Patient is awake and moving all extremities spontaneously but does not follow commands and does not verbalize palliative care has met with patient and family and has emphasized poor prognosis and will continue to meet with the family during the hospitalization.  Current plan is to transition to SNF bed once available.  Expect need for lifelong 24/7 care for all ADLs.  Subjective: Sleeping.  Briefly awakened during exam.  Smiles when awake.  Patient's nurse stated that although he is able to eat he prefers to be fed.  Again does not eat breakfast well which is his baseline.  Likes to stay up late at night and sleeping during the day.  Objective: Vitals:   11/23/20 2001 11/24/20 0505  BP: (!) 158/97 (!) 133/94  Pulse: 89 85  Resp: 18 18  Temp: (!) 97.4 F (36.3 C) 98.6 F (37 C)  SpO2: 96% 93%    Intake/Output Summary (Last 24 hours) at 11/24/2020 0737 Last data filed at 11/24/2020 0500 Gross per 24 hour  Intake 840 ml  Output 250 ml  Net 590 ml   Filed Weights   11/22/20 0353 11/23/20 0200 11/24/20 0505  Weight: 85.7 kg 84.9 kg 85.6 kg    Exam: Constitutional: Calm, no acute distress, appears to be comfortable Heart: Heart sounds are normal, no peripheral edema, pulses regular Respiratory: Posterior lung sounds are clear to auscultation.  Room air. Abdomen: LBM  4/7, soft and nontender.  Bowel sounds present.  Eats marginally well when fed. Genitourinary: Condom catheter in place secondary to ongoing incontinence Neurologic: CN 2-12 intact, still has significant issues with dysarthria Able to ambulate independently. Psychiatric: Sleeping but at baseline awake and oriented to name.  Has significant dysarthria so accurate assessment of orientation not possible.   Assessment/Plan: Acute problems: Out of hospital cardiac arrest secondary to STEMI -ROSC and subsequent cardiac  catheterization revealed dz in circumflex requiring DES -Continue beta-blocker, statin, Plavix, and Imdur  Significant anoxic brain injury secondary to cardiac arrest/deconditioning/gait instability -Consistently participating with PT and OT.  SLP has cleared for dysphagia 2 with thin liquids -Declined by CIR since family unable to provide 24/7 care.  Plan is for SNF -3/25 changed Seroquel to bedtime dosing only 25 mg -3/25 discontinued Symmetrel  -Discussed with nursing staff.  Plan is to provide patient with pictograph as well as pen and paper to write out responses. -Continues to have issues with bowel and bladder incontinence PT NOTES FROM 4/6  Pt with increased interaction, verbalizations and laughing with therapist this session. Received with bowel/urine incontinence and did not seem aware. Pt able to perform ADL tasks such as peri care and washing hands at sink with cueing. Ambulating x 380 feet with a walker at a min guard-min assist level. Tends to drift with environmental distractions and often tries to go in other patient's rooms. Worked on reading room numbers and locating, but pt still with difficulty identifying and navigating. Mobility is steadily improving; continue to recommend regular toileting schedule with staff to address incontinence.    SLP notes 4/7:   Pt was seen for dysphagia and cognitive-linguistic tx. No overt s/s of aspiration were noted during trials of thin liquids and solids. He also demonstrated good oral clearance with no pocketing given extra time for mastication. Recommend continuation of his current DYS 3 diet with thin liquids. SLP also targeted expansion of pt's utterance as he continues to primarily use social greetings as well as head nods/shakes when communicating. Today he expanded his utterances to 3-4 word phrases given Mod-Max cues to express his wants/needs, make comments, and ask questions. He benefited from multimodal cueing such as fill in sentences  and phonemic cues as well as repeating.    OT notes 3/24:  Patient admitted 110 days ago for the above diagnosis.  During this time Jahmani has made progress with his mobility, and certain aspects of his self care.  Unfortunately, OT believes he is most likely at his new cognitive baseline, and it is not expected, he will progress much beyond his current level for bathing, dressing, toileting and self feeding.  Over the past treatments, he has shown inconsistency in physical performance of ADL mainly due to continued balance deficits, and cognitive deficits.  Shanan will need 24 hour minimal assist, supervision, and cueing for most, if not all, functional tasks.  He needs cues to initiate tasks, and cues to sequence through tasks to complete them, and this is not anticipated to improve.  This OT had conversations with NT and RN that are familiar with [name].  We discussed trying to establish a routine for [facility]: consistent awake times, times in the chair, rest times, ADL times, and a toileting schedule.  Both NT and RN verbalized they try to accomplish this, but as described above, inconsistencies with behavior, cooperation and cognitive capacity are the barriers.  OT continues to recommend 24 hour assist as needed, SNF is an option, but  home with family and a toileting plan, could be considered if 24/7 is possible.  Of note: patient's diet was downgraded to full liquid 3/23 due to pocketing of solids, whereas earlier in the week he ate meat and potatoes with utensils and supervision without assist.  Please re-consult OT if definitive, documented improvements in cognition impacting ADL are noted.  Jakarius would benefit from a care planned routine that is followed consistently.        Situational depression -Started Celexa 2/16-increased to 20 mg 2/18 and given increase in depression likely related to significant other's diagnosis of breast cancer on 3/25 will increase Celexa to 40 mg  Ischemic  cardiomyopathy -Echo this admission EF 40% -Given difficulty taking oral medications consistently have opted to transition to once daily dosing as follows: Change Coreg to Toprol-XL 12.5 daily Discontinue hydralazine Changed 3 times daily Isordil to Imdur 30 mg daily -No ACE inhibitor secondary to underlying kidney disease -Follow-up echocardiogram done March 2022 demonstrates an EF of 40 to 45% with LVH and grade 1 diastolic dysfunction physiology  Nutrition Status: Dysphagia with moderate protein calorie malnutrition Nutrition Problem: Increased nutrient needs Etiology: chronic illness (CKD3b) Signs/Symptoms: estimated needs Interventions: Ensure Enlive (each supplement provides 350kcal and 20 grams of protein),MVI,Magic cup,Prostat,Refer to RD note for recommendations PEG tube discontinued on 3/23  Tolerating D3 diet Continue Ensure protein beverages twice daily between meals;  Estimated body mass index is 25.59 kg/m as calculated from the following:   Height as of this encounter: 6' (1.829 m).   Weight as of this encounter: 85.6 kg.  Hypertension -Was on Norvasc prior to admission -Continue cardiovascular meds as listed above  Acute on chronic kidney disease stage IIIb -Suspect prerenal etiology in the setting of hypoperfusion from ventricular fibrillation arrest -Baseline creatinine between 1.6 and 2-creatinine as of 3/14 stable at 1.33    Other problems: Acute hypernatremia -Continue free water per tube  E. coli UTI -Has completed 5 days of Bactrim  Hyperglycemia without an underlying diagnosis of diabetes mellitus Resolved -Likely related to acute stress -Hemoglobin A1c 4.7   Acute hypoxemic respiratory failure -Resolved -Stable on room air with O2 sats between 97 and 99%  Nonsustained VT -Continue amiodarone and beta-blocker -3/26 LFTs normal  Skin tear right lateral back/Rash Wound / Incision (Open or Dehisced) 07/28/20 Skin tear Back Lateral;Right;Upper  (Active)  Date First Assessed/Time First Assessed: 07/28/20 0300   Wound Type: Skin tear  Location: Back  Location Orientation: Lateral;Right;Upper  Present on Admission: No    Assessments 07/28/2020  7:49 PM 11/22/2020 10:00 AM  Dressing Type None --  Wound Length (cm) -- 0 cm  Wound Width (cm) -- 0 cm  Wound Depth (cm) -- 0 cm  Wound Volume (cm^3) -- 0 cm^3  Wound Surface Area (cm^2) -- 0 cm^2  Drainage Amount None --  Treatment Cleansed --     No Linked orders to display   -Rash resolved   Data Reviewed: Basic Metabolic Panel: Recent Labs  Lab 11/18/20 0435  NA 137  K 3.8  CL 105  CO2 22  GLUCOSE 95  BUN 11  CREATININE 1.27*  CALCIUM 9.1   Liver Function Tests: Recent Labs  Lab 11/18/20 0435  AST 19  ALT 32  ALKPHOS 71  BILITOT 1.0  PROT 7.1  ALBUMIN 3.3*   No results for input(s): LIPASE, AMYLASE in the last 168 hours. No results for input(s): AMMONIA in the last 168 hours. CBC: Recent Labs  Lab 11/18/20 0435  WBC 6.8  NEUTROABS 2.7  HGB 13.1  HCT 40.6  MCV 94.2  PLT 174   Cardiac Enzymes: No results for input(s): CKTOTAL, CKMB, CKMBINDEX, TROPONINI in the last 168 hours. BNP (last 3 results) No results for input(s): BNP in the last 8760 hours.  ProBNP (last 3 results) No results for input(s): PROBNP in the last 8760 hours.  CBG: No results for input(s): GLUCAP in the last 168 hours.  No results found for this or any previous visit (from the past 240 hour(s)).   Studies: No results found.  Scheduled Meds: . aspirin  81 mg Oral Daily  . atorvastatin  80 mg Oral Daily  . chlorhexidine  15 mL Mouth Rinse BID  . citalopram  40 mg Oral Daily  . clopidogrel  75 mg Oral Daily  . enoxaparin (LOVENOX) injection  40 mg Subcutaneous Q24H  . feeding supplement  237 mL Oral BID BM  . isosorbide mononitrate  30 mg Oral Daily  . mouth rinse  15 mL Mouth Rinse BID  . metoprolol succinate  12.5 mg Oral Daily  . multivitamin with minerals  1 tablet  Oral Daily  . polyvinyl alcohol  1 drop Both Eyes QID  . QUEtiapine  25 mg Oral QHS  . sodium chloride flush  3 mL Intravenous Q12H   Continuous Infusions:   Active Problems:   Acute ST elevation myocardial infarction (STEMI) (HCC)   Cardiac arrest (HCC)   Elevated blood pressure reading with diagnosis of hypertension   Shock circulatory (HCC)   Encephalopathy   History of ETT   Cardiomyopathy, ischemic   Anoxic brain damage ([place])   Dysphagia   DNR (do not resuscitate) discussion   Gait instability   E. coli UTI   Essential hypertension   Situational depression   Abdominal pain   Consultants:  Cardiology  Interventional radiology  PCCM  Neurology  Procedures:  Cardiac catheterization 12/4  Continuous EEG 12/4 and overnight EEG with video 12/5  Echocardiogram 12/5  EEG 12/9  Percutaneous PEG tube placement 12/30  Antibiotics: Anti-infectives (From admission, onward)   Start     Dose/Rate Route Frequency Ordered Stop   10/14/20 1000  fluconazole (DIFLUCAN) tablet 100 mg        100 mg Oral Daily 10/14/20 0848 10/18/20 0932   09/13/20 1000  sulfamethoxazole-trimethoprim (BACTRIM) 200-40 MG/5ML suspension 20 mL        20 mL Per Tube Every 12 hours 09/13/20 0734 09/19/20 0926   09/13/20 0745  cefTRIAXone (ROCEPHIN) 1 g in sodium chloride 0.9 % 100 mL IVPB  Status:  Discontinued        1 g 200 mL/hr over 30 Minutes Intravenous Every 24 hours 09/13/20 0658 09/13/20 0734   08/17/20 0000  ceFAZolin (ANCEF) IVPB 2g/100 mL premix        2 g 200 mL/hr over 30 Minutes Intravenous To Radiology 08/15/20 1346 08/17/20 1111   07/22/20 0830  cefTRIAXone (ROCEPHIN) 2 g in sodium chloride 0.9 % 100 mL IVPB        2 g 200 mL/hr over 30 Minutes Intravenous Every 24 hours 07/22/20 0720 07/26/20 0858       Time spent: 20 minutes    Erin Hearing ANP  [facility] 7 am - 330 pm/M-F for direct patient care and secure chat Please refer to Amion for contact  information 125 days

## 2020-11-24 NOTE — TOC Transition Note (Addendum)
Transition of Care [facility]) - CM/SW Discharge Note   Patient Details  Name: Antonio Navarro MRN: [id number] Date of Birth: Jun 18, 1961  [facility] CM/SW Contact:  Curlene Labrum, RN Phone Number: 11/24/2020, 11:02 AM   Clinical Narrative:    Case management called and left a message with Antonio Navarro, patient's sister, to inform her that the patient has been offered a bed at [facility] today for admission.  I will continue to reach out to the family today so that transfer can be arranged to the facility.  COVID screen will be ordered and once results returned - the patient will be transferred by ambulance to the facility.  CM and MSW will continue to follow the patient for admission to the facility today.  11/24/2020 1139 - I called and spoke with Antonio Navarro , sister, on the phone and she was informed that the patient received a bed offer from [facility] today and will be able to transfer to the facility by ambulance.  The patient's sister Antonio Navarro agreed on acceptance of the bed offer and transfer to the facility today.  She is currently out of town - so I called and left a message with Antonio Navarro and Antonio Navarro, sister.     Barriers to Discharge: Continued Medical Work up,No SNF bed   Patient Goals and CMS Choice Patient states their goals for this hospitalization and ongoing recovery are:: Family wants pt to go to SNF CMS Medicare.gov Compare Post Acute Care list provided to:: Patient Represenative (must comment) (Son) Choice offered to / list presented to : Adult Children  Discharge Placement                       Discharge Plan and Services                                     Social Determinants of Health (SDOH) Interventions     Readmission Risk Interventions No flowsheet data found.

## 2020-11-24 NOTE — Discharge Summary (Addendum)
Physician Discharge Summary  Antonio Navarro WFU:[id number] DOB: [birth year] DOA: 07/22/2020  PCP: Patient, No Pcp Per (Inactive)  Admit date: 07/22/2020 Discharge date: 11/24/2020  Time spent: 35 minutes  Recommendations for Outpatient Follow-up:  1. Cardiology appointment scheduled with Dr. Radford Pax (cardiology) on Jan 02, 2021 at 8:20AM 2. He will dc to [facility] 3. PT/OT recommends continued therapy. SLP recommends linguistic and swallowing therapies as well.   Discharge Diagnoses:  Active Problems:   Acute ST elevation myocardial infarction (STEMI) (HCC)   Cardiac arrest (HCC)   Elevated blood pressure reading with diagnosis of hypertension   Shock circulatory (HCC)   Encephalopathy   History of ETT   Cardiomyopathy, ischemic   Anoxic brain damage ([place])   Dysphagia   DNR (do not resuscitate) discussion   Gait instability   E. coli UTI   Essential hypertension   Situational depression   Abdominal pain    Discharge Condition: stable  Diet recommendation: [place] Weights   11/22/20 0353 11/23/20 0200 11/24/20 0505  Weight: 85.7 kg 84.9 kg 85.6 kg    History of present illness:  60 year old male history of hypertension prescribed Norvasc prior to admission, chronic back pain, prior renal calculi who experienced an out of hospital cardiac arrest with downtime of 9 minutes.  Initial rhythm was ventricular fibrillation.  He was defibrillated deceived 6 doses of epi before ROSC.  Subsequent work-up revealed STEMI.  He underwent cardiac catheterization with a DES placed to the circumflex.  Cardiology following and has placed patient on Brilinta and aspirin and is also being treated with beta-blocker and statins.  From a neurological standpoint he clinically has anoxic brain injury.  During this hospitalization he experienced AKI and was treated with fluids and correction of hypoperfusion from cardiac arrest.  Echo performed this admission revealed ischemic  cardiomyopathy with an EF of 40%  In addition to above patient has been unable to pass swallowing evaluations and subsequently has undergone percutaneous PEG tube placement on 12/30.  Patient is awake and moving all extremities spontaneously but does not follow commands and does not verbalize palliative care has met with patient and family and has emphasized poor prognosis and will continue to meet with the family during the hospitalization.  Current plan is to transition to SNF bed once available.  Expect need for lifelong 24/7 care for all ADLs.  Hospital Course:  60 year old male history of hypertension prescribed Norvasc prior to admission, chronic back pain, prior renal calculi who experienced an out of hospital cardiac arrest with downtime of 9 minutes.  Initial rhythm was ventricular fibrillation.  He was defibrillated deceived 6 doses of epi before ROSC.  Subsequent work-up revealed STEMI.  He underwent cardiac catheterization with a DES placed to the circumflex.  Cardiology following and has placed patient on Brilinta and aspirin and is also being treated with beta-blocker and statins.  From a neurological standpoint he clinically has anoxic brain injury.  During this hospitalization he experienced AKI and was treated with fluids and correction of hypoperfusion from cardiac arrest.  Echo performed this admission revealed ischemic cardiomyopathy with an EF of 40%  In addition to above patient has been unable to pass swallowing evaluations and subsequently has undergone percutaneous PEG tube placement on 12/30.  Patient is awake and moving all extremities spontaneously but does not follow commands and does not verbalize palliative care has met with patient and family and has emphasized poor prognosis and will continue to meet with the family  during the hospitalization.  Current plan is to transition to SNF bed once available.  Expect need for lifelong 24/7 care for all ADLs.  Procedures:  Cardiac  catheterization 12/4  Continuous EEG 12/4 and overnight EEG with video 12/5  Echocardiogram 12/5  EEG 12/9  Percutaneous PEG tube placement 12/30 and has subsequently been discontinued   Consultations:  Cardiology  Interventional radiology  PCCM  Neurology    Discharge Exam: Vitals:   11/23/20 2001 11/24/20 0505  BP: (!) 158/97 (!) 133/94  Pulse: 89 85  Resp: 18 18  Temp: (!) 97.4 F (36.3 C) 98.6 F (37 C)  SpO2: 96% 93%   Constitutional: Calm, no acute distress, appears to be comfortable Heart: Heart sounds are normal, no peripheral edema, pulses regular Respiratory: Posterior lung sounds are clear to auscultation.  Room air. Abdomen: LBM 4/7, soft and nontender.  Bowel sounds present.  Eats marginally well when fed. Genitourinary: Condom catheter in place secondary to ongoing incontinence Neurologic: CN 2-12 intact, still has significant issues with dysarthria Able to ambulate independently. Psychiatric: Sleeping but at baseline awake and oriented to name.  Has significant dysarthria so accurate assessment of orientation not possible.  Discharge Instructions   Discharge Instructions    AMB Referral to Cardiac Rehabilitation - Phase II   Complete by: As directed    Diagnosis: STEMI   After initial evaluation and assessments completed: Virtual Based Care may be provided alone or in conjunction with Phase 2 Cardiac Rehab based on patient barriers.: Yes   Diet - low sodium heart healthy   Complete by: As directed    Increase activity slowly   Complete by: As directed    Continue physical therapy and Occupational Therapy.  Patient will also need to continue speech therapy for linguistic as well as swallowing issues.   No wound care   Complete by: As directed      Allergies as of 11/24/2020   No Known Allergies     Medication List    STOP taking these medications   amLODipine 10 MG tablet Commonly known as: NORVASC   aspirin-acetaminophen-caffeine  250-250-65 MG tablet Commonly known as: EXCEDRIN MIGRAINE   cyclobenzaprine 5 MG tablet Commonly known as: FLEXERIL   gabapentin 300 MG capsule Commonly known as: Neurontin   lidocaine 5 % Commonly known as: Lidoderm   predniSONE 10 MG (21) Tbpk tablet Commonly known as: STERAPRED UNI-PAK 21 TAB   tiZANidine 4 MG tablet Commonly known as: Zanaflex   traMADol 50 MG tablet Commonly known as: ULTRAM     TAKE these medications   acetaminophen 325 MG tablet Commonly known as: TYLENOL Take 2 tablets (650 mg total) by mouth every 6 (six) hours as needed for mild pain, fever or headache.   aspirin 81 MG chewable tablet Chew 1 tablet (81 mg total) by mouth daily.   atorvastatin 80 MG tablet Commonly known as: LIPITOR Take 1 tablet (80 mg total) by mouth daily.   citalopram 40 MG tablet Commonly known as: CELEXA Take 1 tablet (40 mg total) by mouth daily.   clopidogrel 75 MG tablet Commonly known as: PLAVIX Take 1 tablet (75 mg total) by mouth daily.   docusate sodium 100 MG capsule Commonly known as: COLACE Take 1 capsule (100 mg total) by mouth 2 (two) times daily as needed for mild constipation.   feeding supplement Liqd Take 237 mLs by mouth 2 (two) times daily between meals.   isosorbide mononitrate 30 MG 24 hr tablet Commonly known  as: IMDUR Take 1 tablet (30 mg total) by mouth daily.   metoprolol succinate 25 MG 24 hr tablet Commonly known as: TOPROL-XL Take 0.5 tablets (12.5 mg total) by mouth daily.   multivitamin with minerals Tabs tablet Take 1 tablet by mouth daily.   polyethylene glycol 17 g packet Commonly known as: MIRALAX / GLYCOLAX Take 17 g by mouth daily as needed for moderate constipation.   QUEtiapine 25 MG tablet Commonly known as: SEROQUEL Take 1 tablet (25 mg total) by mouth at bedtime.      No Known Allergies  Contact information for follow-up providers    Sueanne Margarita, MD Follow up on 01/02/2021.   Specialty: Cardiology Why:  Appointment at 820 am Contact information: [address] [place] [address] [phone]            Contact information for after-discharge care    [facility] .   Service: Skilled Nursing Contact information: [place] [phone]                   The results of significant diagnostics from this hospitalization (including imaging, microbiology, ancillary and laboratory) are listed below for reference.    Significant Diagnostic Studies: CT HEAD WO CONTRAST  Result Date: 10/31/2020 CLINICAL DATA:  Altered mental status. EXAM: CT HEAD WITHOUT CONTRAST TECHNIQUE: Contiguous axial images were obtained from the base of the skull through the vertex without intravenous contrast. COMPARISON:  July 22, 2020 FINDINGS: Brain: There is mild cerebral atrophy with widening of the extra-axial spaces and ventricular dilatation. There are areas of decreased attenuation within the white matter tracts of the supratentorial brain, consistent with microvascular disease changes. Vascular: No hyperdense vessel or unexpected calcification. Skull: Normal. Negative for fracture or focal lesion. Sinuses/Orbits: There is mild bilateral maxillary sinus mucosal thickening. Other: None. IMPRESSION: 1. Generalized cerebral atrophy without evidence of acute intracranial pathology. Electronically Signed   By: Virgina Norfolk M.D.   On: 10/31/2020 21:12   IR GASTROSTOMY TUBE REMOVAL/REPAIR  Result Date: 11/08/2020 INDICATION: Patient history of dysphagia protein calorie malnutrition s/p gastrostomy tube placement in IR 08/17/2020. Patient now tolerating p.o. intake. Request is made for removal of gastrostomy tube. EXAM: BEDSIDE REMOVAL OF GASTROSTOMY TUBE COMPARISON:  None. MEDICATIONS: 6 mL 1% viscous lidocaine CONTRAST:  None FLUOROSCOPY TIME:  None COMPLICATIONS: None immediate. PROCEDURE: A time-out was performed prior  to the initiation of the procedure. The track of the existing gastrostomy tube was lubricated with viscous lidocaine. Using manual traction, the existing pull-through gastrostomy tube was removed intact. A dressing was placed. The patient tolerated the procedure well without immediate postprocedural complication. IMPRESSION: Successful bedside removal of pull-through gastrostomy tube without complication Read by: Earley Abide, PA-C Electronically Signed   By: Aletta Edouard M.D.   On: 11/08/2020 15:40    Microbiology: No results found for this or any previous visit (from the past 240 hour(s)).   Labs: Basic Metabolic Panel: Recent Labs  Lab 11/18/20 0435  NA 137  K 3.8  CL 105  CO2 22  GLUCOSE 95  BUN 11  CREATININE 1.27*  CALCIUM 9.1   Liver Function Tests: Recent Labs  Lab 11/18/20 0435  AST 19  ALT 32  ALKPHOS 71  BILITOT 1.0  PROT 7.1  ALBUMIN 3.3*   No results for input(s): LIPASE, AMYLASE in the last 168 hours. No results for input(s): AMMONIA in the last 168 hours.  CBC: Recent Labs  Lab 11/18/20 0435  WBC 6.8  NEUTROABS 2.7  HGB 13.1  HCT 40.6  MCV 94.2  PLT 174   Cardiac Enzymes: No results for input(s): CKTOTAL, CKMB, CKMBINDEX, TROPONINI in the last 168 hours. BNP: BNP (last 3 results) No results for input(s): BNP in the last 8760 hours.  ProBNP (last 3 results) No results for input(s): PROBNP in the last 8760 hours.  CBG: No results for input(s): GLUCAP in the last 168 hours.     Signed:  Binh Doten ANP [facility] 11/24/2020, 12:06 PM

## 2020-11-25 ENCOUNTER — Emergency Department (HOSPITAL_COMMUNITY): Payer: Medicaid Other

## 2020-11-25 ENCOUNTER — Emergency Department (HOSPITAL_COMMUNITY)
Admission: EM | Admit: 2020-11-25 | Discharge: 2020-11-25 | Disposition: A | Discharge status: Home / Self Care | Payer: Medicaid Other | Attending: Emergency Medicine | Admitting: Emergency Medicine

## 2020-11-25 ENCOUNTER — Other Ambulatory Visit: Payer: Self-pay

## 2020-11-25 ENCOUNTER — Encounter (HOSPITAL_COMMUNITY): Payer: Self-pay

## 2020-11-25 DIAGNOSIS — I10 Essential (primary) hypertension: Secondary | ICD-10-CM | POA: Diagnosis not present

## 2020-11-25 DIAGNOSIS — Z79899 Other long term (current) drug therapy: Secondary | ICD-10-CM | POA: Insufficient documentation

## 2020-11-25 DIAGNOSIS — Z20822 Contact with and (suspected) exposure to covid-19: Secondary | ICD-10-CM | POA: Diagnosis not present

## 2020-11-25 DIAGNOSIS — Z7982 Long term (current) use of aspirin: Secondary | ICD-10-CM | POA: Diagnosis not present

## 2020-11-25 DIAGNOSIS — F17210 Nicotine dependence, cigarettes, uncomplicated: Secondary | ICD-10-CM | POA: Insufficient documentation

## 2020-11-25 DIAGNOSIS — R1110 Vomiting, unspecified: Secondary | ICD-10-CM | POA: Diagnosis not present

## 2020-11-25 DIAGNOSIS — Z951 Presence of aortocoronary bypass graft: Secondary | ICD-10-CM | POA: Diagnosis not present

## 2020-11-25 HISTORY — DX: Acute myocardial infarction, unspecified: I21.9

## 2020-11-25 LAB — COMPREHENSIVE METABOLIC PANEL
ALT: 30 U/L (ref 0–44)
AST: 20 U/L (ref 15–41)
Albumin: 3.8 g/dL (ref 3.5–5.0)
Alkaline Phosphatase: 81 U/L (ref 38–126)
Anion gap: 6 (ref 5–15)
BUN: 6 mg/dL (ref 6–20)
CO2: 26 mmol/L (ref 22–32)
Calcium: 9.7 mg/dL (ref 8.9–10.3)
Chloride: 108 mmol/L (ref 98–111)
Creatinine, Ser: 1.25 mg/dL — ABNORMAL HIGH (ref 0.61–1.24)
GFR, Estimated: >60 mL/min (ref >60)
Glucose, Bld: 116 mg/dL — ABNORMAL HIGH (ref 70–99)
Potassium: 4.3 mmol/L (ref 3.5–5.1)
Sodium: 140 mmol/L (ref 135–145)
Total Bilirubin: 1.1 mg/dL (ref 0.3–1.2)
Total Protein: 7.8 g/dL (ref 6.5–8.1)

## 2020-11-25 LAB — CBC
HCT: 44.6 % (ref 39.0–52.0)
Hemoglobin: 14.6 g/dL (ref 13.0–17.0)
MCH: 30.3 pg (ref 26.0–34.0)
MCHC: 32.7 g/dL (ref 30.0–36.0)
MCV: 92.5 fL (ref 80.0–100.0)
Platelets: 219 10*3/uL (ref 150–400)
RBC: 4.82 MIL/uL (ref 4.22–5.81)
RDW: 13.3 % (ref 11.5–15.5)
WBC: 8.2 10*3/uL (ref 4.0–10.5)
nRBC: 0 % (ref 0.0–0.2)

## 2020-11-25 LAB — URINALYSIS, ROUTINE W REFLEX MICROSCOPIC
Bilirubin Urine: NEGATIVE
Glucose, UA: NEGATIVE mg/dL
Hgb urine dipstick: NEGATIVE
Ketones, ur: NEGATIVE mg/dL
Leukocytes,Ua: NEGATIVE
Nitrite: NEGATIVE
Protein, ur: 30 mg/dL — AB
Specific Gravity, Urine: >1.046 — ABNORMAL HIGH (ref 1.005–1.030)
pH: 7 (ref 5.0–8.0)

## 2020-11-25 LAB — RESP PANEL BY RT-PCR (FLU A&B, COVID) ARPGX2
Influenza A by PCR: NEGATIVE
Influenza B by PCR: NEGATIVE
SARS Coronavirus 2 by RT PCR: NEGATIVE

## 2020-11-25 LAB — LIPASE, BLOOD: Lipase: 71 U/L — ABNORMAL HIGH (ref 11–51)

## 2020-11-25 IMAGING — DX DG ABDOMEN 1V
2 series · 2 of 2 positions shown · non-contrast
Comparison: [DATE]

CLINICAL DATA: Vomiting

EXAM:
ABDOMEN - 1 VIEW

[abdomen kub (1 of 2)]
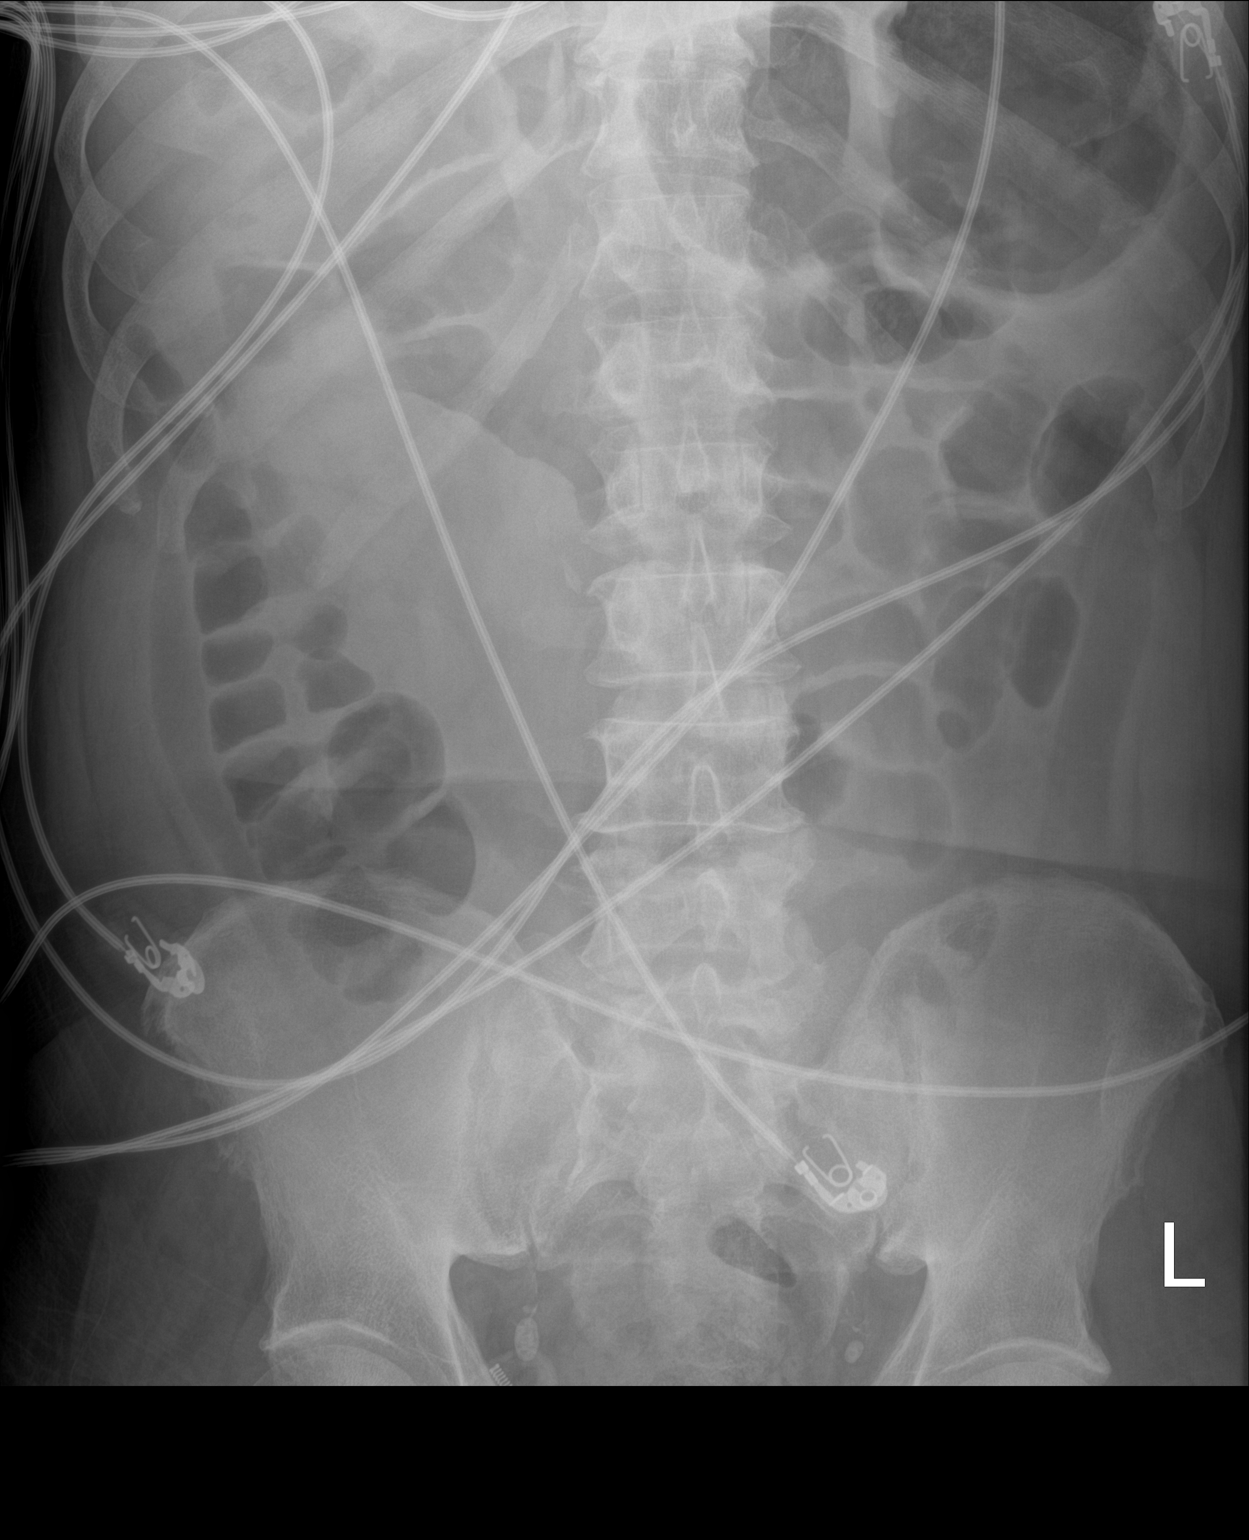

[abdomen kub (2 of 2)]
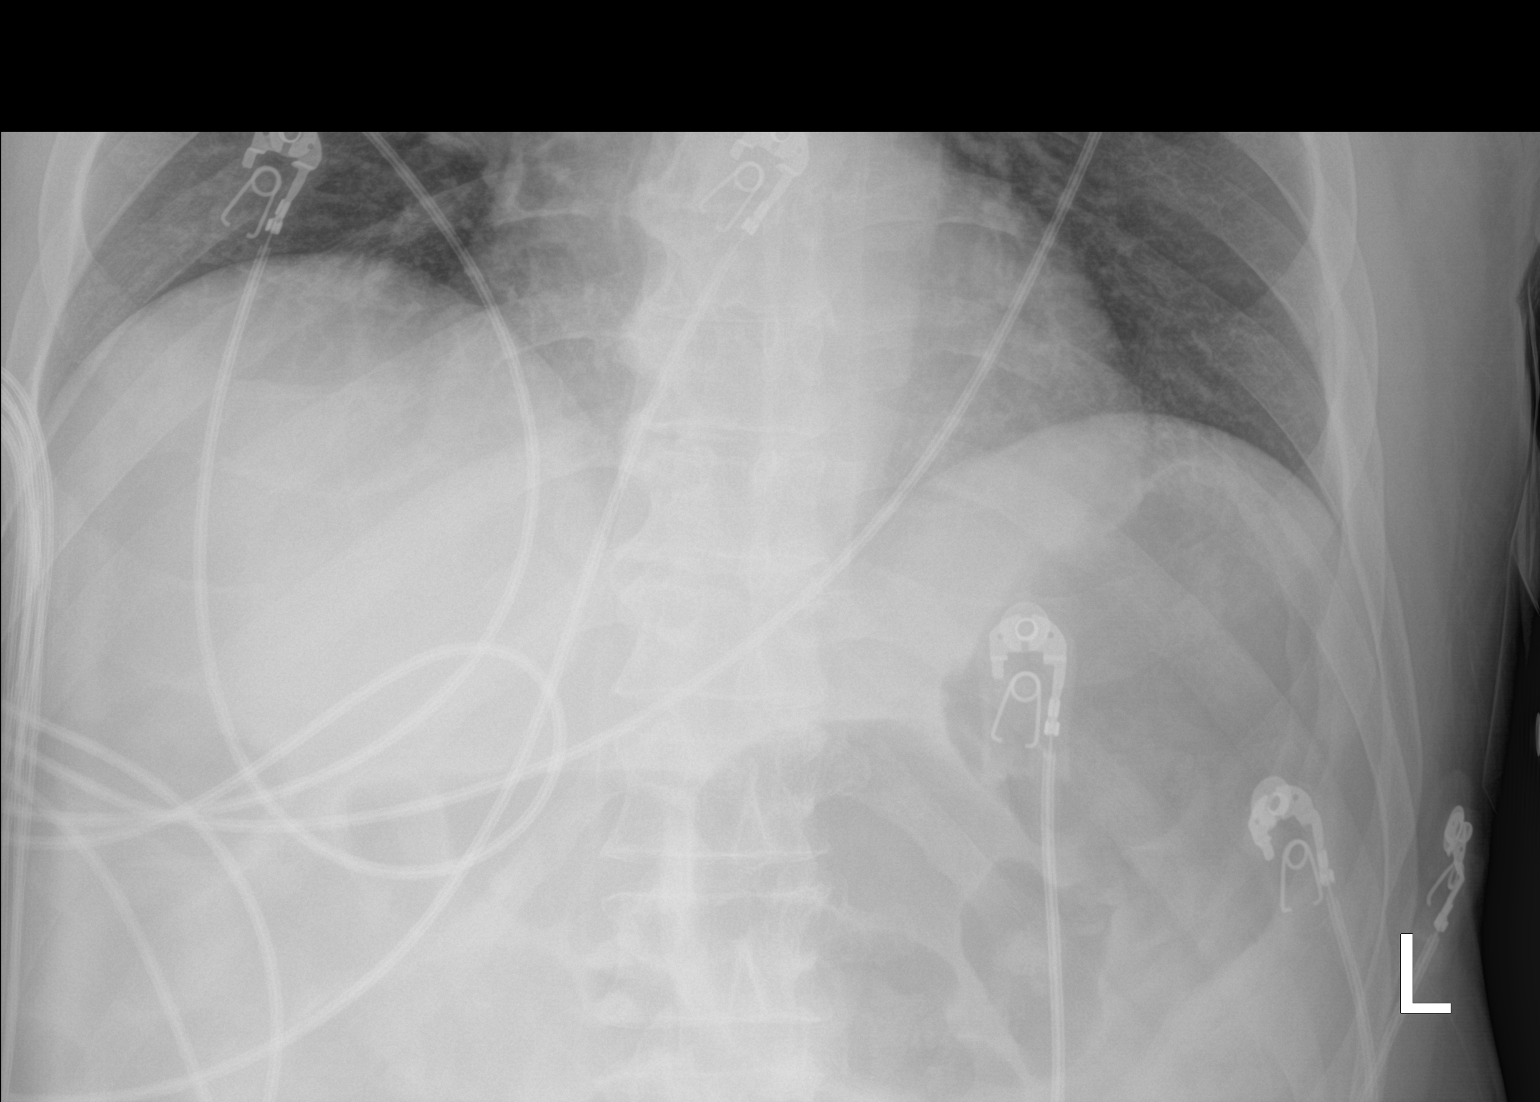

[2 of 2 positions shown; findings below may reference images not displayed]

FINDINGS: There is no bowel dilatation or air-fluid level to suggest bowel
obstruction. No free air. Apparent phleboliths noted in the pelvis.
Gastrostomy catheter no longer appreciable. Lung bases clear.
IMPRESSION: No bowel obstruction or free air evident on supine examination. Lung
bases clear.

## 2020-11-25 IMAGING — CT CT HEAD W/O CM
3 of 4 series · 14 of 47 positions shown, 16 images · non-contrast
Comparison: CT head dated [DATE].

CLINICAL DATA: Altered mental status.

EXAM:
CT HEAD WITHOUT CONTRAST
TECHNIQUE: Contiguous axial images were obtained from the base of the skull
through the vertex without intravenous contrast.

[Series 4: head 2.0 h70h · axial · 0.51mm/px · z∈[-102,+38]mm · 8 of 88 slices shown, 10 images]
[im 9/88  brain]
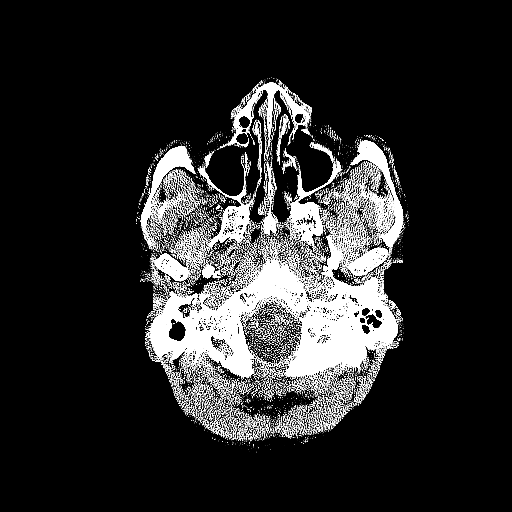
[im 9/88  bone]
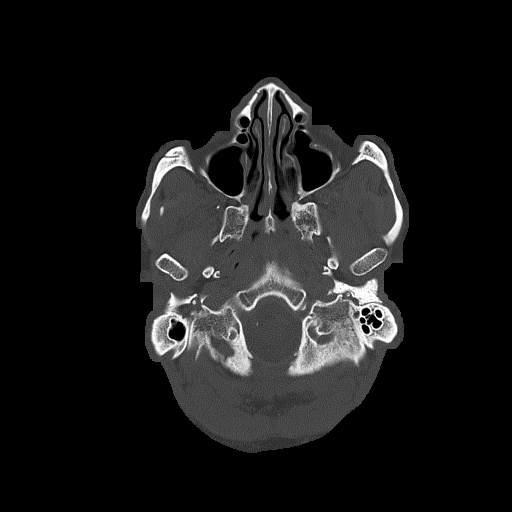
[im 18/88  brain]
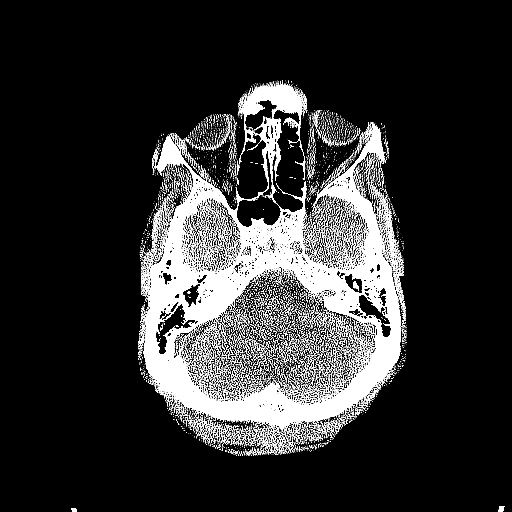
[im 27/88  brain]
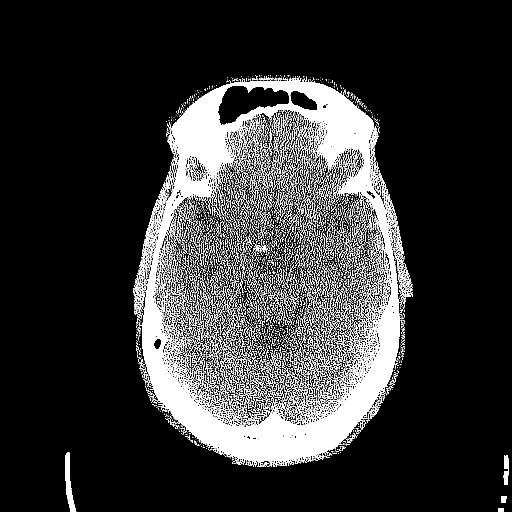
[im 40/88  brain]
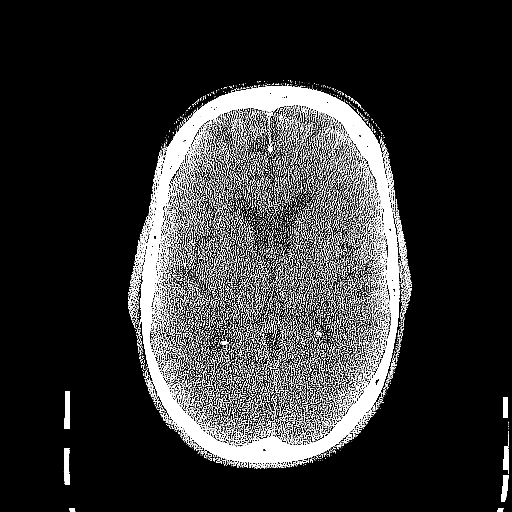
[im 48/88  brain]
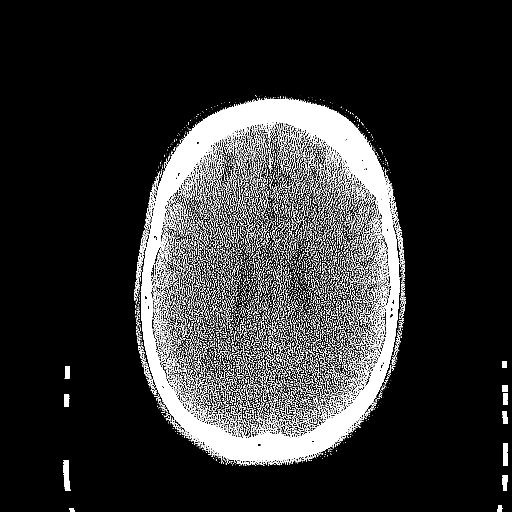
[im 48/88  bone]
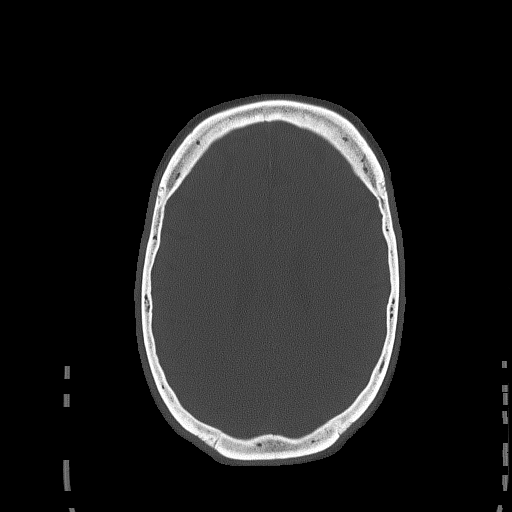
[im 61/88  brain]
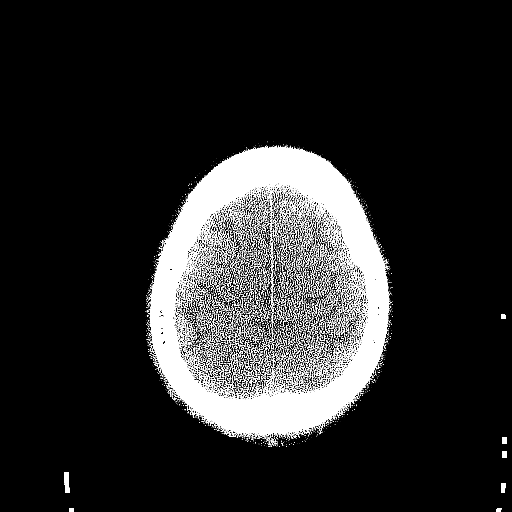
[im 70/88  brain]
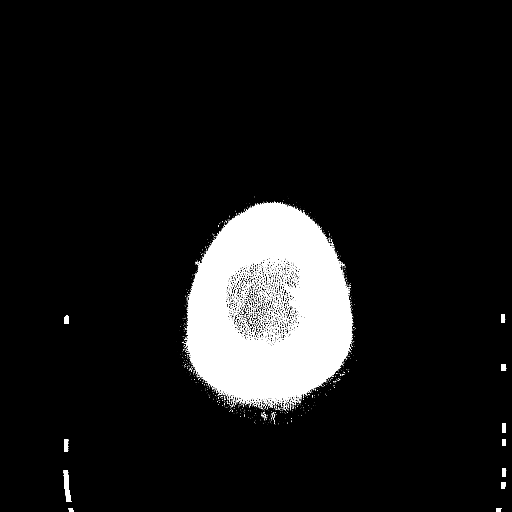
[im 79/88  brain]
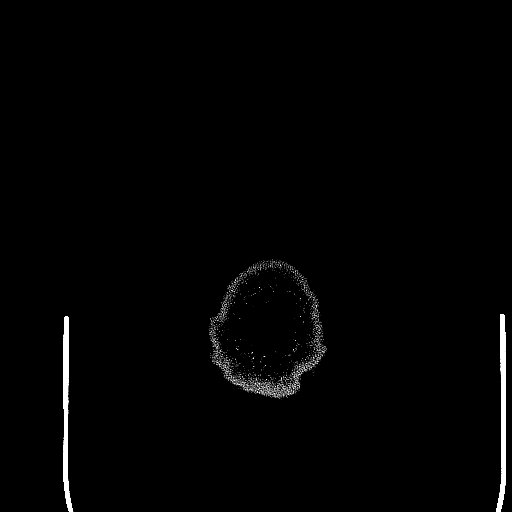

[Series 5: head 3.0 mpr cor · coronal · 0.34mm/px · 3 of 74 slices shown]
[im 25/74  brain]
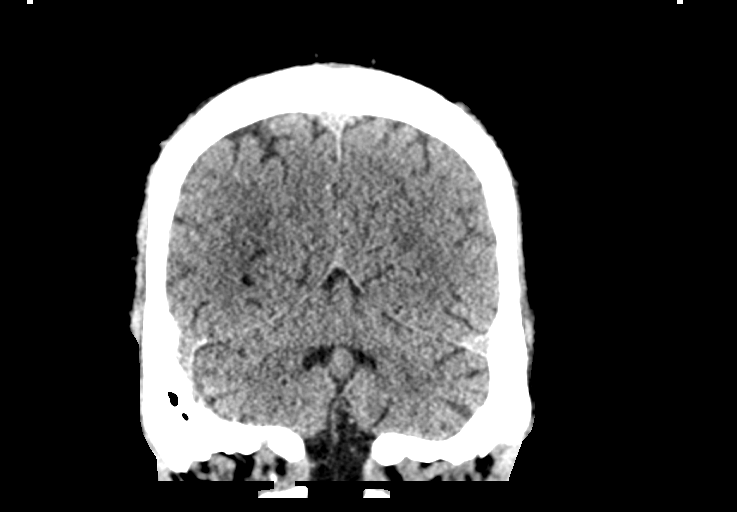
[im 33/74  brain]
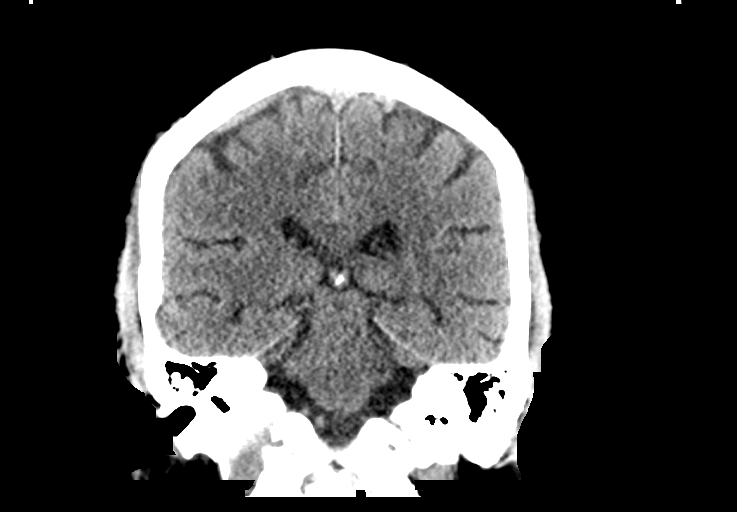
[im 41/74  brain]
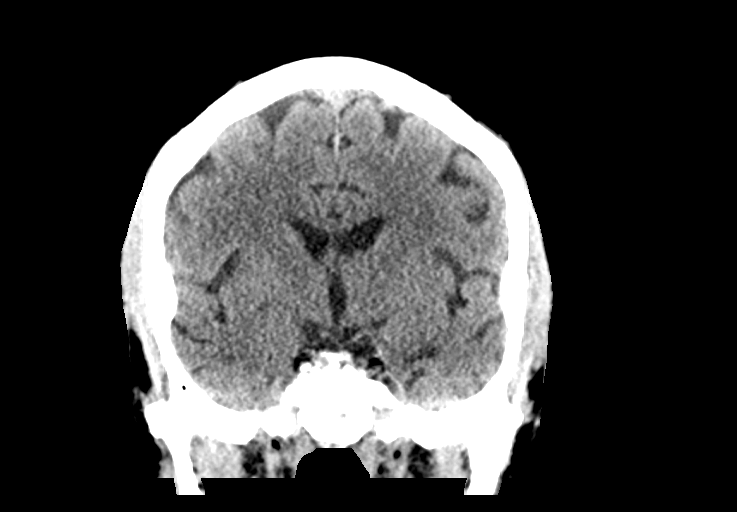

[Series 6: head 3.0 mpr sag · sagittal · 0.34mm/px · 3 of 67 slices shown]
[im 23/67  brain]
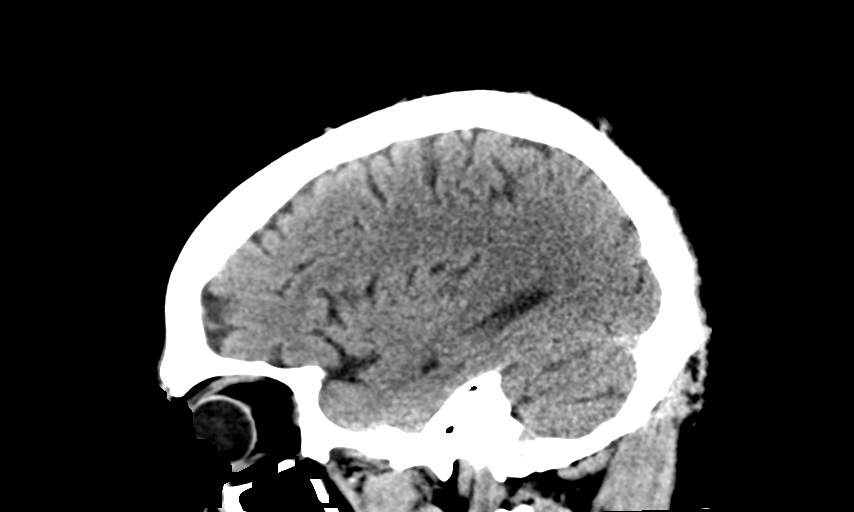
[im 34/67  brain]
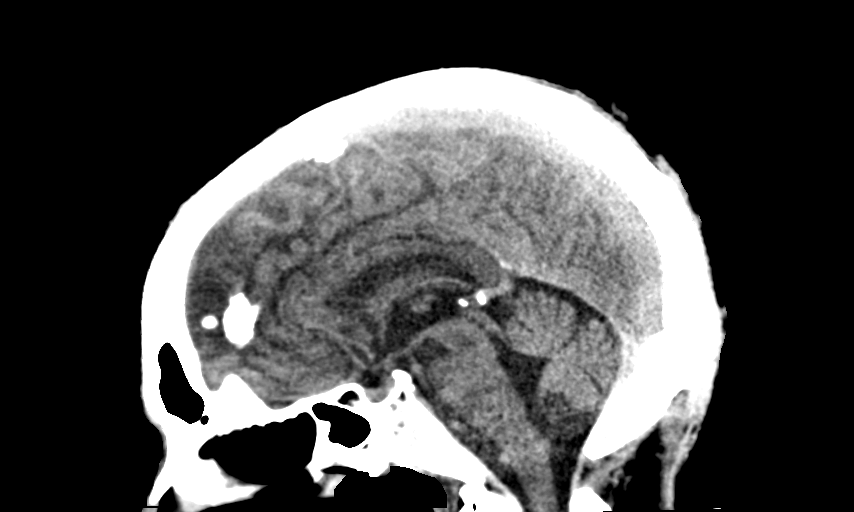
[im 45/67  brain]
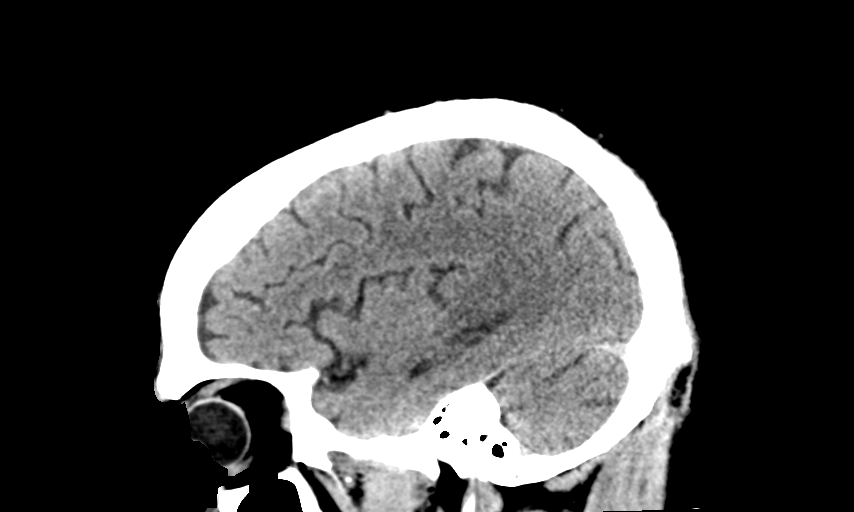

[14 of 47 positions shown; findings below may reference images not displayed]

FINDINGS: Brain: No evidence of acute infarction, hemorrhage, hydrocephalus,
extra-axial collection or mass lesion/mass effect. Similar mild
generalized cerebral atrophy.

Vascular: Calcified atherosclerosis at the skullbase. No hyperdense
vessel.

Skull: Normal. Negative for fracture or focal lesion.

Sinuses/Orbits: No acute finding.

Other: None.
IMPRESSION: 1. No acute intracranial abnormality.

## 2020-11-25 IMAGING — DX DG CHEST 1V PORT
1 series · 1 of 1 positions shown · non-contrast
Comparison: [DATE]

CLINICAL DATA: Hypertension and vomiting

EXAM:
PORTABLE CHEST 1 VIEW

[chest ap]
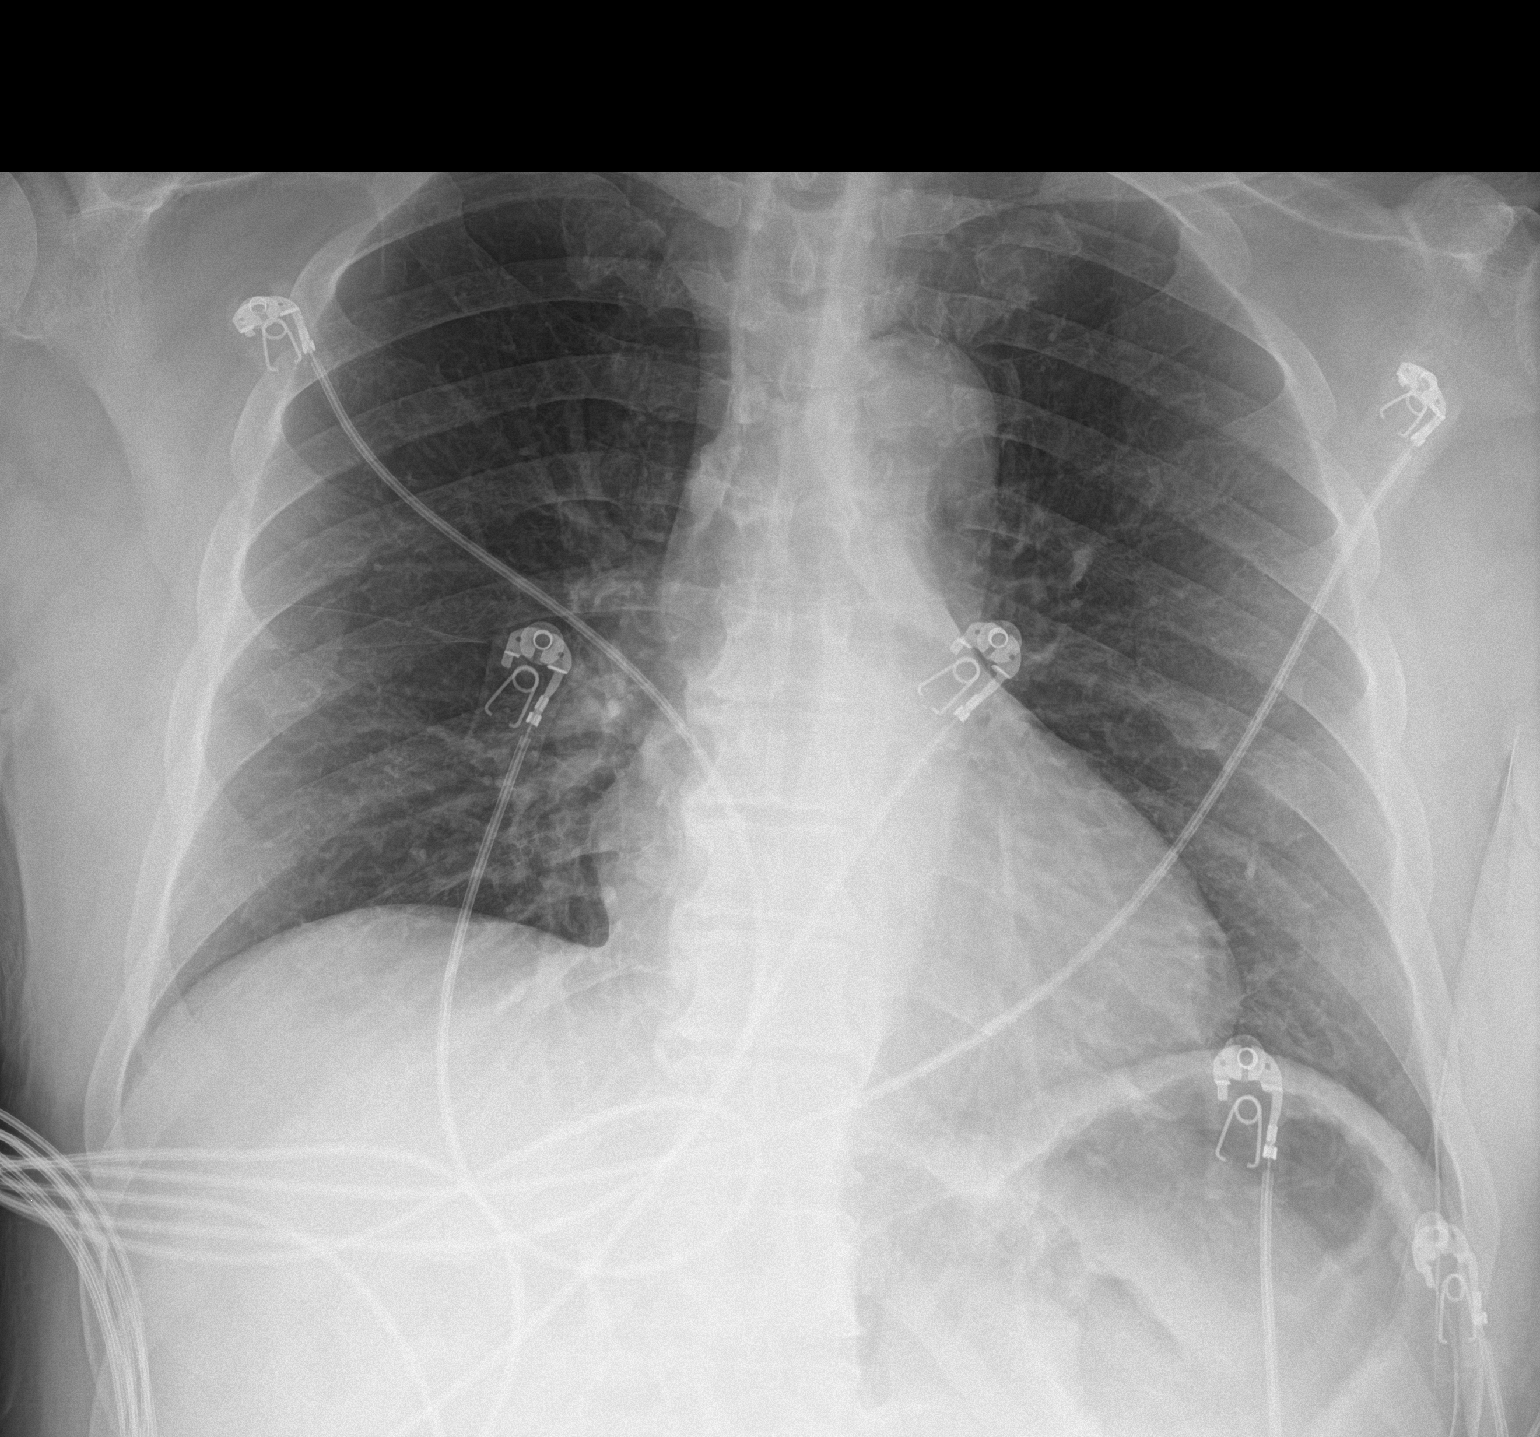

[1 of 1 positions shown; findings below may reference images not displayed]

FINDINGS: Lungs are clear. Heart size and pulmonary vascularity are normal. No
adenopathy. There are foci of degenerative change in the thoracic
spine.
IMPRESSION: Lungs clear.  Cardiac silhouette normal.

## 2020-11-25 IMAGING — CT CT ABD-PELV W/ CM
2 of 5 series · 16 of 46 positions shown, 18 images · IV contrast (omnipaque)
Comparison: Abdomen radiographs obtained earlier today. Abdomen CT
dated [DATE]. Abdomen and pelvis CT dated [DATE].

CLINICAL DATA: Single episode of a large amount of vomiting.

EXAM:
CT ABDOMEN AND PELVIS WITH CONTRAST
TECHNIQUE: Multidetector CT imaging of the abdomen and pelvis was performed
using the standard protocol following bolus administration of
intravenous contrast.
CONTRAST:  100mL OMNIPAQUE IOHEXOL 300 MG/ML  SOLN

[Series 3: abd/ pelvis 5.0 i30f 2 · axial · 0.81mm/px · z∈[-886,-421]mm · 13 of 105 slices shown, 15 images]
[im 6/105  soft-tissue]
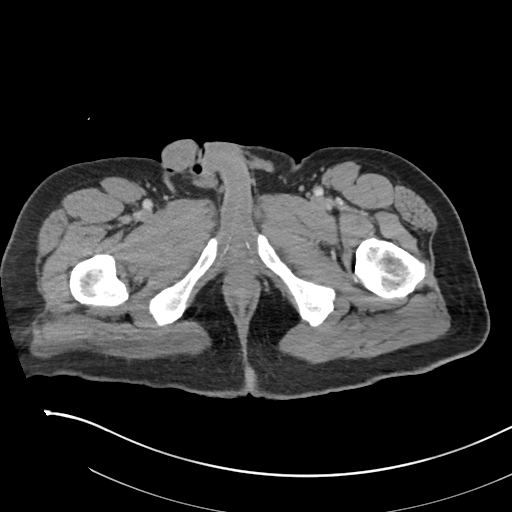
[im 6/105  bone]
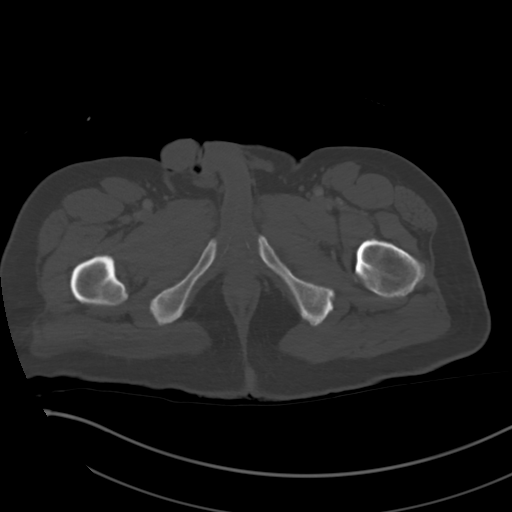
[im 16/105  soft-tissue]
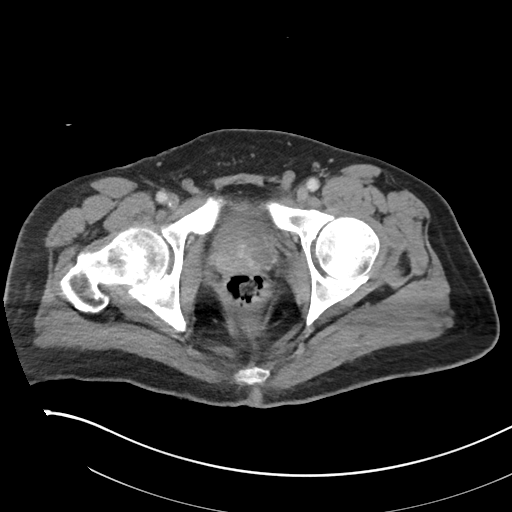
[im 21/105  soft-tissue]
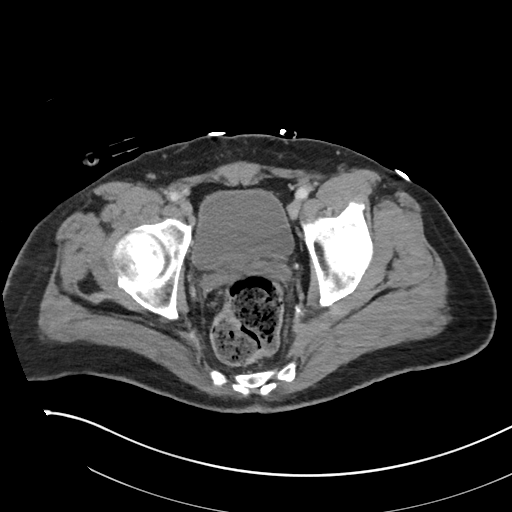
[im 32/105  soft-tissue]
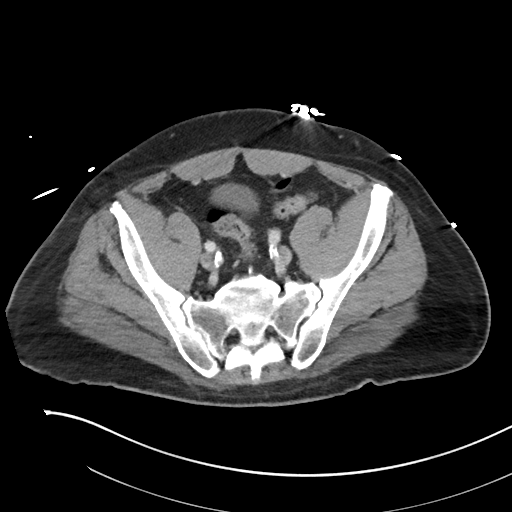
[im 37/105  soft-tissue]
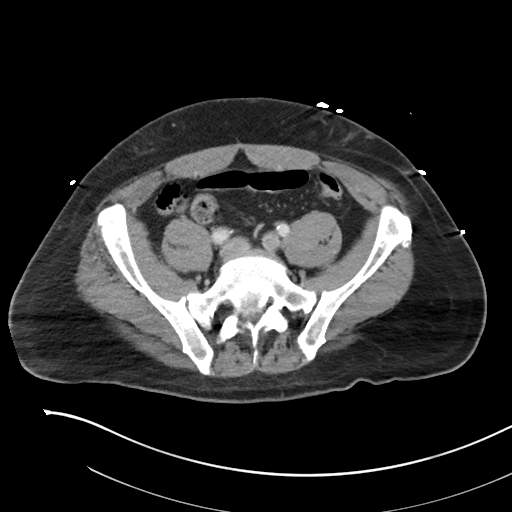
[im 47/105  soft-tissue]
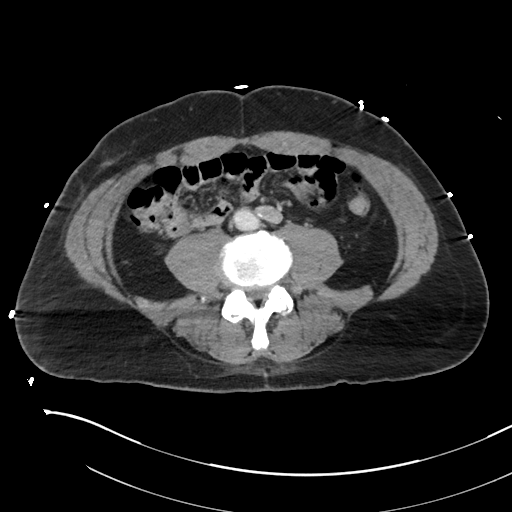
[im 53/105  soft-tissue]
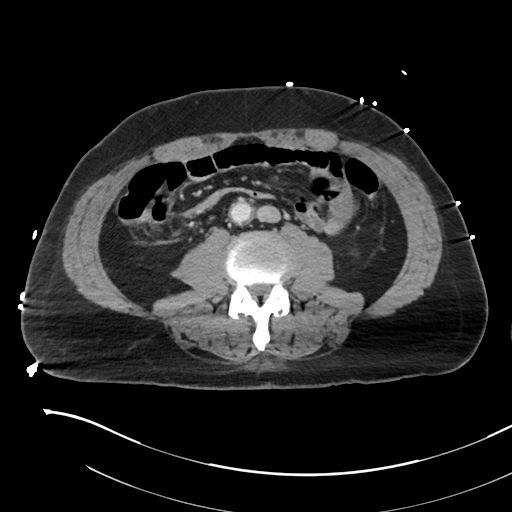
[im 58/105  soft-tissue]
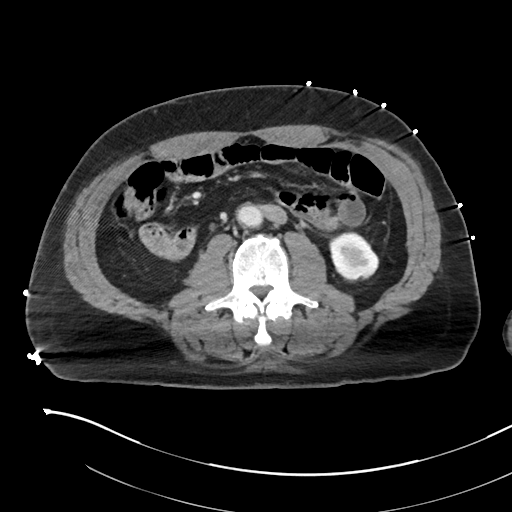
[im 68/105  soft-tissue]
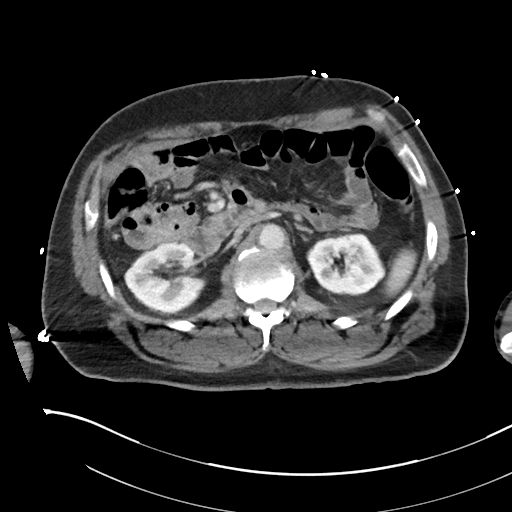
[im 68/105  bone]
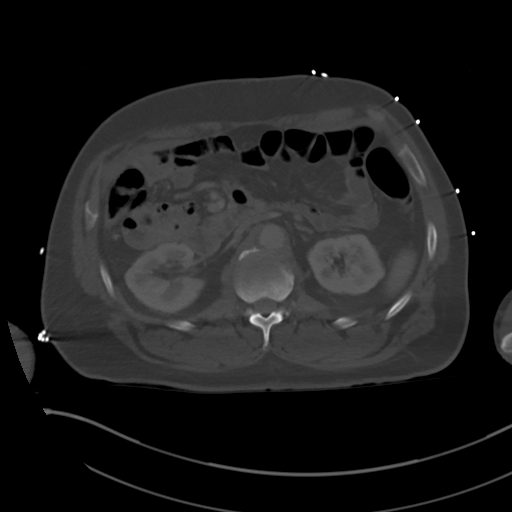
[im 73/105  soft-tissue]
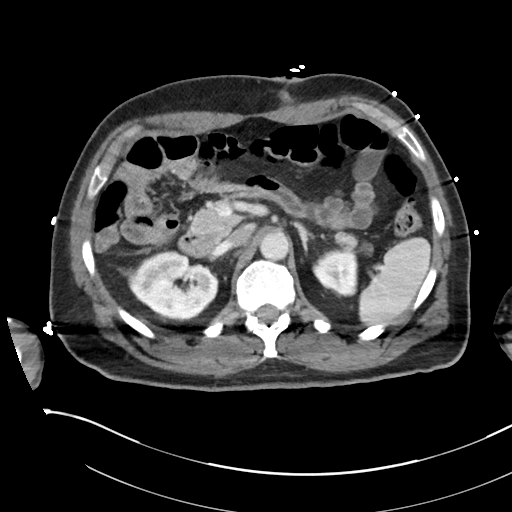
[im 84/105  soft-tissue]
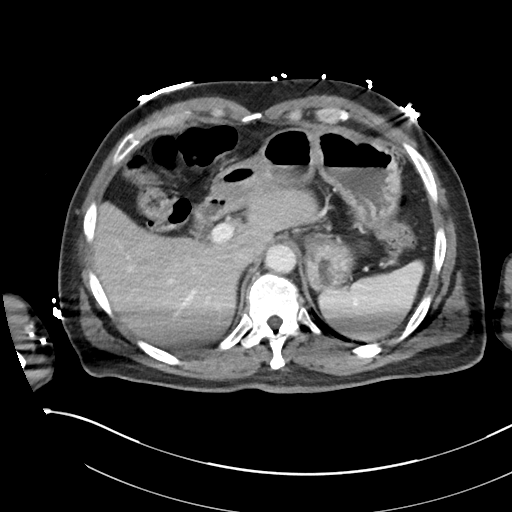
[im 89/105  soft-tissue]
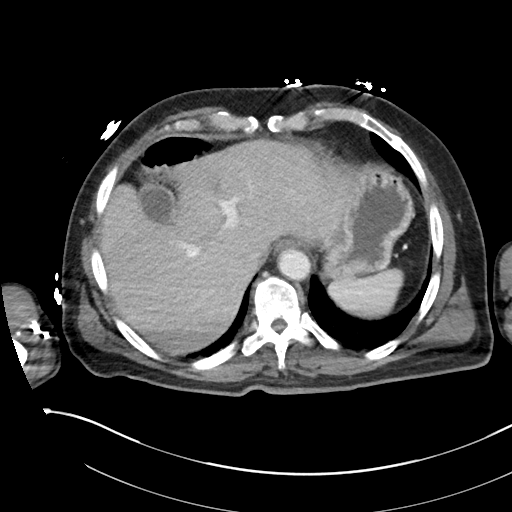
[im 99/105  soft-tissue]
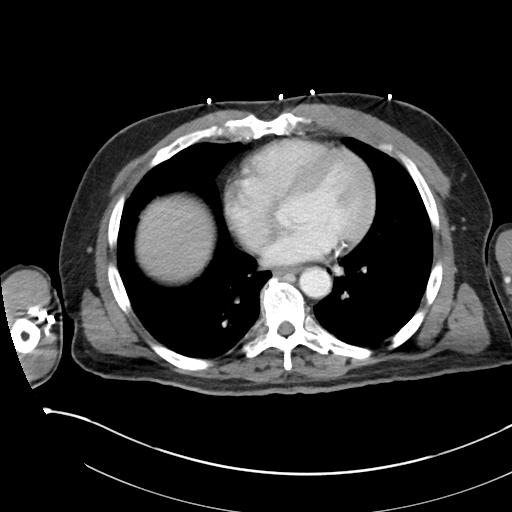

[Series 6: coronal soft tissue · coronal · 0.88mm/px · 3 of 99 slices shown]
[im 33/99  soft-tissue]
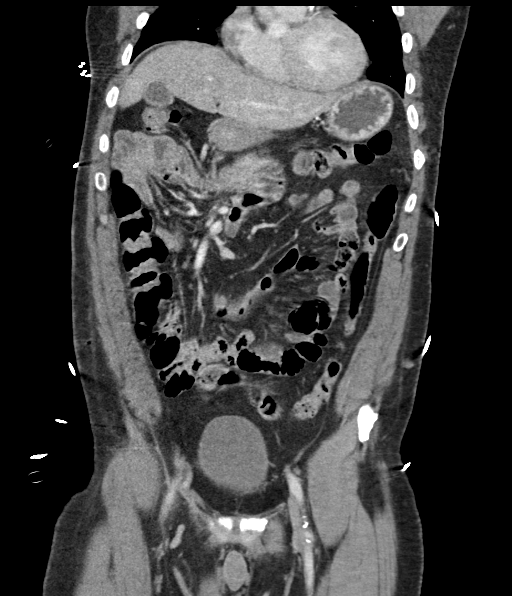
[im 44/99  soft-tissue]
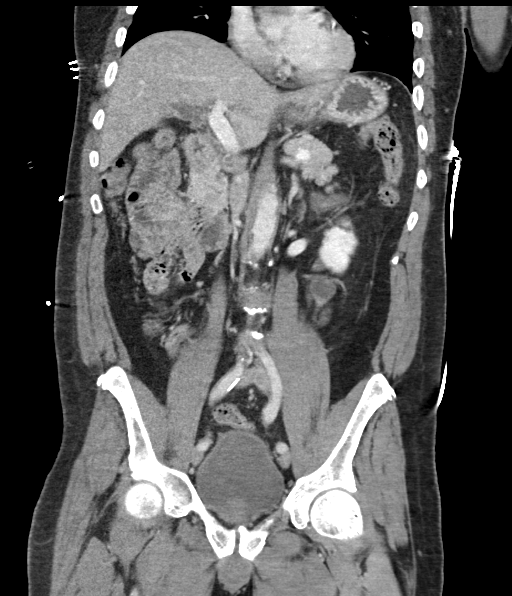
[im 55/99  soft-tissue]
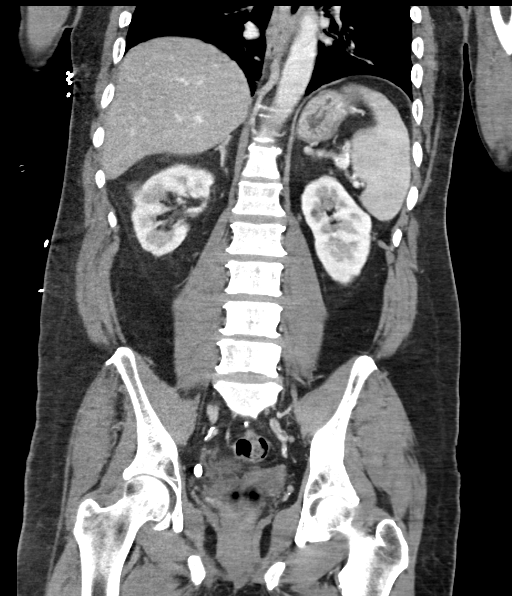

[16 of 46 positions shown; findings below may reference images not displayed]

FINDINGS: Lower chest: Normal sized heart with dense coronary artery
calcifications. Minimal dependent atelectasis/scarring at both lung
bases, greater on the right.

Hepatobiliary: Mild diffuse low density of the liver relative to the
spleen. Normal appearing gallbladder.

Pancreas: Unremarkable. No pancreatic ductal dilatation or
surrounding inflammatory changes.

Spleen: Normal in size without focal abnormality.

Adrenals/Urinary Tract: Normal appearing adrenal glands. Small
bilateral renal cysts. The previously demonstrated small bilateral
renal calculi are not visualized today. Unremarkable ureters and
urinary bladder.

Stomach/Bowel: Unremarkable stomach, small bowel and colon. No
evidence of appendicitis.

Vascular/Lymphatic: Atheromatous arterial calcifications without
aneurysm. No enlarged lymph nodes.

Reproductive: Mildly enlarged prostate gland.

Other: Multiple rounded areas of soft tissue density and soft tissue
stranding in the anterior subcutaneous fat bilaterally, compatible
with multiple current/previous injections.

Musculoskeletal: Old, healed bilateral rib fractures. Lumbar and
lower thoracic spine degenerative changes.
IMPRESSION: 1. No acute abnormality.
2. Mild diffuse hepatic steatosis.
3. Dense coronary artery atheromatous calcifications.

## 2020-11-25 MED ORDER — CLONIDINE HCL 0.1 MG PO TABS
0.1000 mg | ORAL_TABLET | Freq: Once | ORAL | Status: AC
  Administered 2020-11-25: 0.1 mg via ORAL
  Filled 2020-11-25: qty 1

## 2020-11-25 MED ORDER — ONDANSETRON 4 MG PO TBDP: ORAL_TABLET | ORAL | 0 refills | Status: DC

## 2020-11-25 MED ORDER — ONDANSETRON HCL 4 MG/2ML IJ SOLN
4.0000 mg | Freq: Once | INTRAMUSCULAR | Status: AC | PRN
  Administered 2020-11-25: 4 mg via INTRAVENOUS
  Filled 2020-11-25: qty 2

## 2020-11-25 MED ORDER — SODIUM CHLORIDE 0.9 % IV SOLN
12.5000 mg | Freq: Four times a day (QID) | INTRAVENOUS | Status: DC | PRN
  Administered 2020-11-25: 12.5 mg via INTRAVENOUS
  Filled 2020-11-25: qty 0.5

## 2020-11-25 MED ORDER — IOHEXOL 300 MG/ML  SOLN
100.0000 mL | Freq: Once | INTRAMUSCULAR | Status: AC | PRN
  Administered 2020-11-25: 100 mL via INTRAVENOUS

## 2020-11-25 NOTE — ED Notes (Signed)
I am waiting to give clonidine until phenergan admin starts

## 2020-11-25 NOTE — ED Notes (Signed)
ED Provider at bedside. 

## 2020-11-25 NOTE — ED Notes (Signed)
Pt's sister requests for pt's fiancee to be called at phone number [phone] regarding if he is going to be discharged.  I will pass on to next shift.

## 2020-11-25 NOTE — ED Notes (Signed)
Returned from CT.

## 2020-11-25 NOTE — Discharge Instructions (Addendum)
Continue taking your medicine as prescribed. Take Zofran for nausea  See your doctor for follow up in the next 2 to 3 days  Return to ER if you have worse vomiting, abdominal pain, altered mental status

## 2020-11-25 NOTE — ED Triage Notes (Signed)
[facility] staff called EMS due to HTN and vomitting that started this am.  Hx of stemi in 12/21, suffered anoxic brain injury per ems

## 2020-11-25 NOTE — ED Notes (Signed)
Patient transported to CT 

## 2020-11-25 NOTE — ED Notes (Signed)
Pt d/c by MD and is provided w/ d/c instructions Pt is out of ED transported by [facility]

## 2020-11-25 NOTE — ED Provider Notes (Signed)
[facility] EMERGENCY DEPARTMENT Provider Note   CSN: [id number] Arrival date & time: 11/25/20  1305     History Chief Complaint  Patient presents with  . Hypertension  . Emesis    Emesis one time today, threw up am b/p meds    Antonio Navarro is a 60 y.o. male.  HPI    Level 5 caveat secondary to patient's history of TBI His sister is at the bedside.  She, another sister, and his son have been providing direction for his medical care.  He has a significant other but is not married. 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  He had a prolonged hospitalization and was discharged yesterday to [facility] and skilled nursing facility.  He was returned here to the ED today due to reports that he had had a large amount of vomiting with one episode.  He then was noted to have an elevated blood pressure which was thought to be probably secondary to vomiting his blood pressure medications this morning.  He did vomit again here in the ED.  There is no report of decreased appetite, fever, chills, abdominal pain, or diarrhea.  Patient denies any of these things.  Past Medical History:  Diagnosis Date  . Hypertension   . Kidney stones   . MI (myocardial infarction) [facility])     Patient Active Problem List   Diagnosis Date Noted  . Abdominal pain   . Situational depression   . Essential hypertension   . E. coli UTI   . Gait instability   . DNR (do not resuscitate) discussion   . Anoxic brain damage (HCC)   . Dysphagia   . Cardiomyopathy, ischemic   . History of ETT   . Encephalopathy   . Acute ST elevation myocardial infarction (STEMI) ([place])   . Cardiac arrest ([place])   . Elevated blood pressure reading with diagnosis of hypertension   . Shock circulatory [facility])     Past Surgical History:  Procedure Laterality Date  . CORONARY/GRAFT ACUTE MI REVASCULARIZATION N/A 07/22/2020   Procedure: Coronary/Graft Acute MI Revascularization;  Surgeon:  Lorretta Harp, MD;  Location: [place] CV LAB;  Service: Cardiovascular;  Laterality: N/A;  . IR GASTROSTOMY TUBE MOD SED  08/17/2020  . IR GASTROSTOMY TUBE REMOVAL  11/08/2020  . LEFT HEART CATH AND CORONARY ANGIOGRAPHY N/A 07/22/2020   Procedure: LEFT HEART CATH AND CORONARY ANGIOGRAPHY;  Surgeon: Lorretta Harp, MD;  Location: [place] CV LAB;  Service: Cardiovascular;  Laterality: N/A;       Family History  Problem Relation Age of Onset  . Cancer Mother   . Hypertension Father     Social History   Tobacco Use  . Smoking status: Current Every Day Smoker    Packs/day: 1.00    Types: Cigarettes  . Smokeless tobacco: Never Used  Vaping Use  . Vaping Use: Never used  Substance Use Topics  . Alcohol use: Yes    Comment: occasional  . Drug use: Yes    Types: Marijuana    Comment: occ    Home Medications Prior to Admission medications   Medication Sig Start Date End Date Taking? Authorizing Provider  acetaminophen (TYLENOL) 325 MG tablet Take 2 tablets (650 mg total) by mouth every 6 (six) hours as needed for mild pain, fever or headache. 11/24/20   Samella Parr, NP  aspirin 81 MG chewable tablet Chew 1 tablet (81 mg total) by  mouth daily. 11/24/20   Samella Parr, NP  atorvastatin (LIPITOR) 80 MG tablet Take 1 tablet (80 mg total) by mouth daily. 11/24/20   Samella Parr, NP  citalopram (CELEXA) 40 MG tablet Take 1 tablet (40 mg total) by mouth daily. 11/24/20   Samella Parr, NP  clopidogrel (PLAVIX) 75 MG tablet Take 1 tablet (75 mg total) by mouth daily. 11/24/20   Samella Parr, NP  docusate sodium (COLACE) 100 MG capsule Take 1 capsule (100 mg total) by mouth 2 (two) times daily as needed for mild constipation. 11/24/20   Samella Parr, NP  feeding supplement (ENSURE ENLIVE / ENSURE PLUS) LIQD Take 237 mLs by mouth 2 (two) times daily between meals. 11/24/20   Samella Parr, NP  isosorbide mononitrate (IMDUR) 30 MG 24 hr tablet Take 1 tablet (30 mg  total) by mouth daily. 11/24/20   Samella Parr, NP  metoprolol succinate (TOPROL-XL) 25 MG 24 hr tablet Take 0.5 tablets (12.5 mg total) by mouth daily. 11/24/20   Samella Parr, NP  Multiple Vitamin (MULTIVITAMIN WITH MINERALS) TABS tablet Take 1 tablet by mouth daily. 11/24/20   Samella Parr, NP  polyethylene glycol (MIRALAX / GLYCOLAX) 17 g packet Take 17 g by mouth daily as needed for moderate constipation. 11/24/20   Samella Parr, NP  QUEtiapine (SEROQUEL) 25 MG tablet Take 1 tablet (25 mg total) by mouth at bedtime. 11/24/20   Samella Parr, NP    Allergies    Patient has no known allergies.  Review of Systems   Review of Systems  Unable to perform ROS: Other    Physical Exam Updated Vital Signs BP (!) 185/119 (BP Location: Right Arm)   Pulse 82   Temp 97.7 F (36.5 C) (Oral)   Resp 18   Ht 1.829 m (6')   Wt 85.6 kg   SpO2 97%   BMI 25.59 kg/m   Physical Exam Vitals reviewed.  Constitutional:      Appearance: Normal appearance.  HENT:     Head: Normocephalic.     Right Ear: External ear normal.     Left Ear: External ear normal.     Nose: Nose normal.     Mouth/Throat:     Pharynx: Oropharynx is clear.  Eyes:     Pupils: Pupils are equal, round, and reactive to light.  Cardiovascular:     Rate and Rhythm: Normal rate and regular rhythm.     Pulses: Normal pulses.  Pulmonary:     Effort: Pulmonary effort is normal.  Abdominal:     General: Abdomen is flat.     Palpations: Abdomen is soft.     Comments: Healing G-tube site  Musculoskeletal:        General: Normal range of motion.     Cervical back: Normal range of motion.  Skin:    General: Skin is warm.     Capillary Refill: Capillary refill takes less than 2 seconds.  Neurological:     General: No focal deficit present.     Mental Status: He is alert.  Psychiatric:        Mood and Affect: Mood normal.     ED Results / Procedures / Treatments   Labs (all labs ordered are listed, but only  abnormal results are displayed) Labs Reviewed  LIPASE, BLOOD  COMPREHENSIVE METABOLIC PANEL  CBC  URINALYSIS, ROUTINE W REFLEX MICROSCOPIC    EKG EKG Interpretation  Date/Time:  Saturday  November 25 2020 13:17:18 EDT Ventricular Rate:  82 PR Interval:  132 QRS Duration: 96 QT Interval:  404 QTC Calculation: 472 R Axis:   -46 Text Interpretation: Sinus rhythm Abnormal R-wave progression, early transition Inferior infarct, old Confirmed by Pattricia Boss [phone]) on 11/25/2020 3:12:23 PM   Radiology DG Abdomen 1 View  Result Date: 11/25/2020 CLINICAL DATA:  Vomiting EXAM: ABDOMEN - 1 VIEW COMPARISON:  September 15, 2020 FINDINGS: There is no bowel dilatation or air-fluid level to suggest bowel obstruction. No free air. Apparent phleboliths noted in the pelvis. Gastrostomy catheter no longer appreciable. Lung bases clear. IMPRESSION: No bowel obstruction or free air evident on supine examination. Lung bases clear. Electronically Signed   By: Lowella Grip III M.D.   On: 11/25/2020 14:50   DG Chest Port 1 View  Result Date: 11/25/2020 CLINICAL DATA:  Hypertension and vomiting EXAM: PORTABLE CHEST 1 VIEW COMPARISON:  October 05, 2020 FINDINGS: Lungs are clear. Heart size and pulmonary vascularity are normal. No adenopathy. There are foci of degenerative change in the thoracic spine. IMPRESSION: Lungs clear.  Cardiac silhouette normal. Electronically Signed   By: Lowella Grip III M.D.   On: 11/25/2020 14:51    Procedures .Critical Care Performed by: Pattricia Boss, MD Authorized by: Pattricia Boss, MD   Critical care provider statement:    Critical care time (minutes):  45   Critical care end time:  11/25/2020 3:59 PM   Critical care was time spent personally by me on the following activities:  Discussions with consultants, evaluation of patient's response to treatment, examination of patient, ordering and performing treatments and interventions, ordering and review of laboratory studies,  ordering and review of radiographic studies, pulse oximetry, re-evaluation of patient's condition, obtaining history from patient or surrogate and review of old charts     Medications Ordered in ED Medications  ondansetron (ZOFRAN) injection 4 mg (has no administration in time range)    ED Course  I have reviewed the triage vital signs and the nursing notes.  Pertinent labs & imaging results that were available during my care of the patient were reviewed by me and considered in my medical decision making (see chart for details).    MDM Rules/Calculators/A&P                          60 year old man with recent discharge to skilled nursing facility.  Today he is sent back in hypertension and vomiting.  It is unclear if the hypertension is due to the vomiting and vomiting his medication or is the initiating factor.  He has been given antihypertensives here in the ED.  He has vomited twice here.  He has been given antiemetics.  His abdomen is soft nontender and do not think there is indication for abdominal CT.  Given his high blood pressure, TBI, CT of head ordered to assure there is no acute intracranial cause of his vomiting. If vomiting controlled, CT negative, and blood pressure controlled, will at ride to get patient's discharged back to his skilled nursing facility. Patient care discussed with Dr. Darl Householder and he has assumed care Final Clinical Impression(s) / ED Diagnoses Final diagnoses:  Vomiting, intractability of vomiting not specified, presence of nausea not specified, unspecified vomiting type  Hypertension, unspecified type    Rx / DC Orders ED Discharge Orders    None       Pattricia Boss, MD 11/25/20 1559

## 2020-11-25 NOTE — ED Notes (Signed)
Report called to [facility], They are aware pt is returning back to facility

## 2020-11-25 NOTE — ED Provider Notes (Signed)
  Physical Exam  BP (!) 167/102 (BP Location: Right Arm)   Pulse 82   Temp 97.7 F (36.5 C) (Oral)   Resp 17   Ht 6' (1.829 m)   Wt 85.6 kg   SpO2 97%   BMI 25.59 kg/m   Physical Exam  ED Course/Procedures     Procedures  MDM  Care assumed at 3 PM from Dr. Jeanell Sparrow.  Patient was just discharged from the hospital yesterday and went to a nursing home.  He had MI during the last admission.  He returned today because of vomiting.  Abdomen was soft and nontender and lipase was minimally elevated.  Patient was noted to be hypertensive.  Signout pending CT head and reassessment blood pressure  7:21 PM CT head unremarkable.  Blood pressure down to the 160s now.  Patient tolerated p.o. in the ED.  CT abdomen pelvis unremarkable.  Continues to denies any chest pain.  Stable for discharge back to facility    Drenda Freeze, MD 11/25/20 1921

## 2020-11-25 NOTE — ED Notes (Signed)
Spoke to ED pharmacist, she will have main pharmacy mix the phenergan and tube, pt aware we are waiting, has had one additional episode of emesis - approx 100cc yellow, thin

## 2020-12-27 NOTE — Progress Notes (Deleted)
Virtual Visit via Video Note   This visit type was conducted due to national recommendations for restrictions regarding the COVID-19 Pandemic (e.g. social distancing) in an effort to limit this patient's exposure and mitigate transmission in our community.  Due to his co-morbid illnesses, this patient is at least at moderate risk for complications without adequate follow up.  This format is felt to be most appropriate for this patient at this time.  All issues noted in this document were discussed and addressed.  A limited physical exam was performed with this format.  Please refer to the patient's chart for his consent to telehealth for [facility].   Date:  12/27/2020   ID:  Antonio Navarro, DOB [birth year], MRN [id number] The patient was identified using 2 identifiers.  Patient Location: Home Provider Location: Home Office   PCP:  Patient, No Pcp Per (Inactive)   [facility] Providers Cardiologist:  Fransico Him, MD {   Evaluation Performed:  Follow-Up Visit  Chief Complaint:  CAD, post mI  History of Present Illness:    Antonio Navarro is a 60 y.o. male with a hx of HTN who presented to [facility] ER with an out of the hospital cardiac arrest with downtime of 9 minutes.  Initial rhythm was ventricular fibrillation. He was defibrillated deceived 6 doses of epi before ROSC. Subsequent work-up revealed STEMI. He underwent cardiac catheterization showing 40% pRCA and 95% mLCx and had a DES placed to the circumflex and was placed on Brilinta, aspirin, beta-blocker and statins. From a neurological standpoint he clinically suffered anoxic brain injury. During this hospitalization he experienced AKIEcho revealed ischemic cardiomyopathy with an EF of 40% and basal inferior wall motion abnormality. He had a prolonged hospital course and then went to rehab and was discharged after admission 4 months prior.    He is here today for followup and is doing well.  He denies any chest pain or  pressure, SOB, DOE, PND, orthopnea, LE edema, dizziness, palpitations or syncope. He is compliant with his meds and is tolerating meds with no SE.    The patient does not have symptoms concerning for COVID-19 infection (fever, chills, cough, or new shortness of breath).    Past Medical History:  Diagnosis Date  . Hypertension   . Kidney stones   . MI (myocardial infarction) [facility])    Past Surgical History:  Procedure Laterality Date  . CORONARY/GRAFT ACUTE MI REVASCULARIZATION N/A 07/22/2020   Procedure: Coronary/Graft Acute MI Revascularization;  Surgeon: Lorretta Harp, MD;  Location: [place] CV LAB;  Service: Cardiovascular;  Laterality: N/A;  . IR GASTROSTOMY TUBE MOD SED  08/17/2020  . IR GASTROSTOMY TUBE REMOVAL  11/08/2020  . LEFT HEART CATH AND CORONARY ANGIOGRAPHY N/A 07/22/2020   Procedure: LEFT HEART CATH AND CORONARY ANGIOGRAPHY;  Surgeon: Lorretta Harp, MD;  Location: [place] CV LAB;  Service: Cardiovascular;  Laterality: N/A;     No outpatient medications have been marked as taking for the 01/02/21 encounter (Appointment) with Sueanne Margarita, MD.     Allergies:   Patient has no known allergies.   Social History   Tobacco Use  . Smoking status: Current Every Day Smoker    Packs/day: 1.00    Types: Cigarettes  . Smokeless tobacco: Never Used  Vaping Use  . Vaping Use: Never used  Substance Use Topics  . Alcohol use: Yes    Comment: occasional  . Drug use: Yes    Types: Marijuana  Comment: occ     Family Hx: The patient's family history includes Cancer in his mother; Hypertension in his father.  ROS:   Please see the history of present illness.     All other systems reviewed and are negative.   Prior CV studies:   The following studies were reviewed today:  Cardiac Cath 07/2021 Conclusion    Prox RCA lesion is 40% stenosed.  Mid Cx lesion is 95% stenosed.  A drug-eluting stent was successfully placed using a [place]  [id number].  Post intervention, there is a 0% residual stenosis.  2D echo 07/2020 IMPRESSIONS   1. Left ventricular ejection fraction, by estimation, is 40 to 45%. The  left ventricle has mildly decreased function. Left ventricular endocardial  border not optimally defined to evaluate regional wall motion, but  regional wall motion abnormalities are  present. There is mild left ventricular hypertrophy. Left ventricular  diastolic parameters are consistent with Grade I diastolic dysfunction  (impaired relaxation).  2. Right ventricular systolic function is mildly reduced. The right  ventricular size is normal. Tricuspid regurgitation signal is inadequate  for assessing PA pressure.  3. The aortic valve is tricuspid. Aortic valve regurgitation is not  visualized. No aortic stenosis is present.  4. The mitral valve is grossly normal. No evidence of mitral valve  regurgitation. No evidence of mitral stenosis.  5. The inferior vena cava is normal in size with <50% respiratory  variability, suggesting right atrial pressure of 8 mmHg.    Labs/Other Tests and Data Reviewed:    EKG:  No ECG reviewed.  Recent Labs: 09/30/2020: Magnesium 1.7 11/25/2020: ALT 30; BUN 6; Creatinine, Ser 1.25; Hemoglobin 14.6; Platelets 219; Potassium 4.3; Sodium 140   Recent Lipid Panel Lab Results  Component Value Date/Time   TRIG 138 07/26/2020 05:26 AM    Wt Readings from Last 3 Encounters:  11/25/20 188 lb 11.4 oz (85.6 kg)  11/24/20 188 lb 11.4 oz (85.6 kg)  06/02/20 225 lb (102.1 kg)     Risk Assessment/Calculations:      Objective:    Vital Signs:  There were no vitals taken for this visit.   VITAL SIGNS:  reviewed GEN:  no acute distress EYES:  sclerae anicteric, EOMI - Extraocular Movements Intact RESPIRATORY:  normal respiratory effort, symmetric expansion CARDIOVASCULAR:  no peripheral edema SKIN:  no rash, lesions or ulcers. MUSCULOSKELETAL:  no obvious deformities. NEURO:   alert and oriented x 3, no obvious focal deficit PSYCH:  normal affect  ASSESSMENT & PLAN:    1.  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  Ischemic DCM -EF 40-45% with basal inferior wall motion abnormality on echo -continue Toprol XL 12.5mg  daily   3.  HLD -LDL goal < 70 -continue atorvastatin 80mg  daily -check FLP and ALT   {Are you ordering a CV Procedure (e.g. stress test, cath, DCCV, TEE, etc)?   Press F2        :[id number]}    COVID-19 Education: The signs and symptoms of COVID-19 were discussed with the patient and how to seek care for testing (follow up with PCP or arrange E-visit).  ***The importance of social distancing was discussed today.  Time:   Today, I have spent *** minutes with the patient with telehealth technology  discussing the above problems.     Medication Adjustments/Labs and Tests Ordered: Current medicines are reviewed at length with the patient today.  Concerns regarding medicines are outlined above.   Tests Ordered: No orders of the defined types were placed in this encounter.   Medication Changes: No orders of the defined types were placed in this encounter.   Follow Up:  {F/U [phone]} {follow up:15908}  Signed, Fransico Him, MD  12/27/2020 8:22 AM    [facility]

## 2021-01-02 ENCOUNTER — Ambulatory Visit: Payer: Self-pay | Admitting: Cardiology

## 2021-02-16 ENCOUNTER — Other Ambulatory Visit (HOSPITAL_COMMUNITY): Payer: Self-pay | Admitting: *Deleted

## 2021-02-16 DIAGNOSIS — R1310 Dysphagia, unspecified: Secondary | ICD-10-CM

## 2021-02-21 ENCOUNTER — Encounter (HOSPITAL_COMMUNITY): Payer: Self-pay

## 2021-02-21 ENCOUNTER — Ambulatory Visit (HOSPITAL_COMMUNITY): Payer: Self-pay

## 2021-02-21 ENCOUNTER — Other Ambulatory Visit: Payer: Self-pay

## 2021-02-21 ENCOUNTER — Ambulatory Visit (HOSPITAL_COMMUNITY): Admission: RE | Admit: 2021-02-21 | Payer: Medicaid Other | Source: Ambulatory Visit

## 2021-02-24 ENCOUNTER — Encounter (HOSPITAL_COMMUNITY): Payer: Self-pay | Admitting: Emergency Medicine

## 2021-02-24 ENCOUNTER — Emergency Department (HOSPITAL_COMMUNITY)
Admission: EM | Admit: 2021-02-24 | Discharge: 2021-02-24 | Disposition: A | Discharge status: Home / Self Care | Payer: Medicaid Other | Attending: Emergency Medicine | Admitting: Emergency Medicine

## 2021-02-24 ENCOUNTER — Other Ambulatory Visit: Payer: Self-pay

## 2021-02-24 ENCOUNTER — Emergency Department (HOSPITAL_COMMUNITY): Payer: Medicaid Other

## 2021-02-24 DIAGNOSIS — R456 Violent behavior: Secondary | ICD-10-CM | POA: Insufficient documentation

## 2021-02-24 DIAGNOSIS — Z79899 Other long term (current) drug therapy: Secondary | ICD-10-CM | POA: Diagnosis not present

## 2021-02-24 DIAGNOSIS — I10 Essential (primary) hypertension: Secondary | ICD-10-CM | POA: Diagnosis not present

## 2021-02-24 DIAGNOSIS — F03918 Unspecified dementia, unspecified severity, with other behavioral disturbance: Secondary | ICD-10-CM

## 2021-02-24 DIAGNOSIS — Z20822 Contact with and (suspected) exposure to covid-19: Secondary | ICD-10-CM | POA: Diagnosis not present

## 2021-02-24 DIAGNOSIS — F0391 Unspecified dementia with behavioral disturbance: Secondary | ICD-10-CM

## 2021-02-24 DIAGNOSIS — Z7982 Long term (current) use of aspirin: Secondary | ICD-10-CM | POA: Diagnosis not present

## 2021-02-24 DIAGNOSIS — Z7902 Long term (current) use of antithrombotics/antiplatelets: Secondary | ICD-10-CM | POA: Insufficient documentation

## 2021-02-24 DIAGNOSIS — F17210 Nicotine dependence, cigarettes, uncomplicated: Secondary | ICD-10-CM | POA: Insufficient documentation

## 2021-02-24 DIAGNOSIS — R4182 Altered mental status, unspecified: Secondary | ICD-10-CM | POA: Insufficient documentation

## 2021-02-24 LAB — CBC
HCT: 37.4 % — ABNORMAL LOW (ref 39.0–52.0)
Hemoglobin: 12.3 g/dL — ABNORMAL LOW (ref 13.0–17.0)
MCH: 30.8 pg (ref 26.0–34.0)
MCHC: 32.9 g/dL (ref 30.0–36.0)
MCV: 93.5 fL (ref 80.0–100.0)
Platelets: 159 10*3/uL (ref 150–400)
RBC: 4 MIL/uL — ABNORMAL LOW (ref 4.22–5.81)
RDW: 12.6 % (ref 11.5–15.5)
WBC: 4.3 10*3/uL (ref 4.0–10.5)
nRBC: 0 % (ref 0.0–0.2)

## 2021-02-24 LAB — COMPREHENSIVE METABOLIC PANEL
ALT: 24 U/L (ref 0–44)
AST: 23 U/L (ref 15–41)
Albumin: 3.3 g/dL — ABNORMAL LOW (ref 3.5–5.0)
Alkaline Phosphatase: 60 U/L (ref 38–126)
Anion gap: 8 (ref 5–15)
BUN: 9 mg/dL (ref 6–20)
CO2: 26 mmol/L (ref 22–32)
Calcium: 9.3 mg/dL (ref 8.9–10.3)
Chloride: 108 mmol/L (ref 98–111)
Creatinine, Ser: 1.19 mg/dL (ref 0.61–1.24)
GFR, Estimated: >60 mL/min (ref >60)
Glucose, Bld: 92 mg/dL (ref 70–99)
Potassium: 3.5 mmol/L (ref 3.5–5.1)
Sodium: 142 mmol/L (ref 135–145)
Total Bilirubin: 1.2 mg/dL (ref 0.3–1.2)
Total Protein: 6.4 g/dL — ABNORMAL LOW (ref 6.5–8.1)

## 2021-02-24 LAB — URINALYSIS, ROUTINE W REFLEX MICROSCOPIC
Bilirubin Urine: NEGATIVE
Glucose, UA: NEGATIVE mg/dL
Hgb urine dipstick: NEGATIVE
Ketones, ur: NEGATIVE mg/dL
Leukocytes,Ua: NEGATIVE
Nitrite: NEGATIVE
Protein, ur: NEGATIVE mg/dL
Specific Gravity, Urine: 1.019 (ref 1.005–1.030)
pH: 5 (ref 5.0–8.0)

## 2021-02-24 LAB — RESP PANEL BY RT-PCR (FLU A&B, COVID) ARPGX2
Influenza A by PCR: NEGATIVE
Influenza B by PCR: NEGATIVE
SARS Coronavirus 2 by RT PCR: NEGATIVE

## 2021-02-24 IMAGING — CT CT HEAD W/O CM
4 series · 15 of 47 positions shown, 17 images · non-contrast
Comparison: Head CT dated [DATE].

CLINICAL DATA: Head trauma, coagulopathy.  Altered mental status.

EXAM:
CT HEAD WITHOUT CONTRAST
TECHNIQUE: Contiguous axial images were obtained from the base of the skull
through the vertex without intravenous contrast.

[Series 3: head without · axial · non-contrast · 0.48mm/px · z∈[+9,+134]mm · 7 of 35 slices shown, 9 images]
[im 5/35  brain]
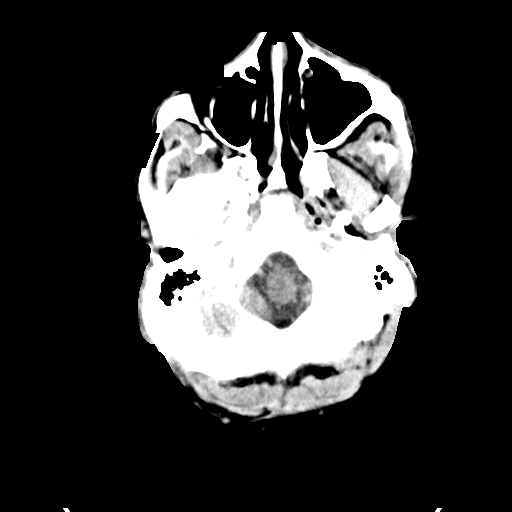
[im 5/35  bone]
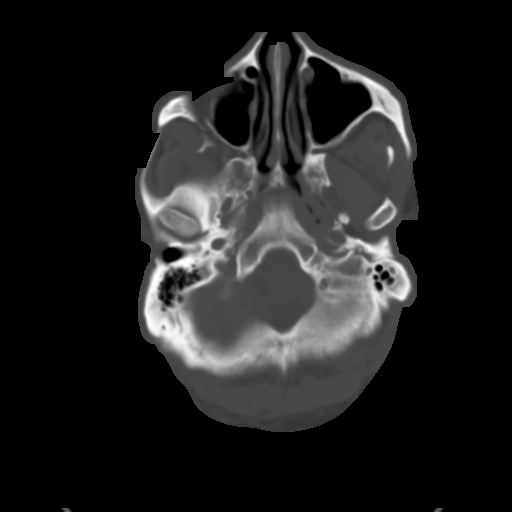
[im 9/35  brain]
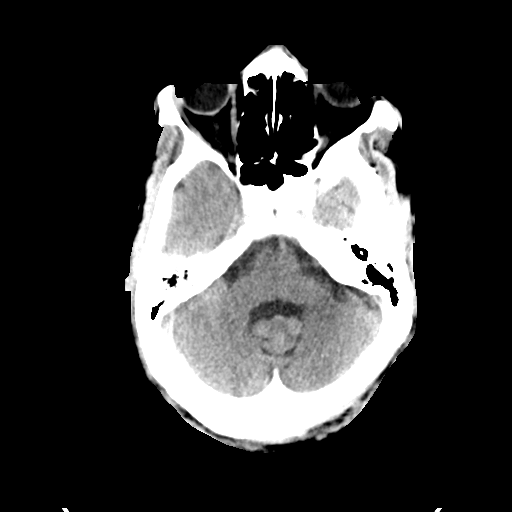
[im 13/35  brain]
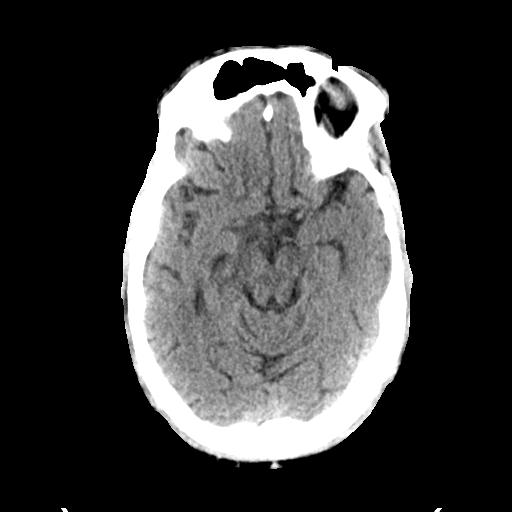
[im 18/35  brain]
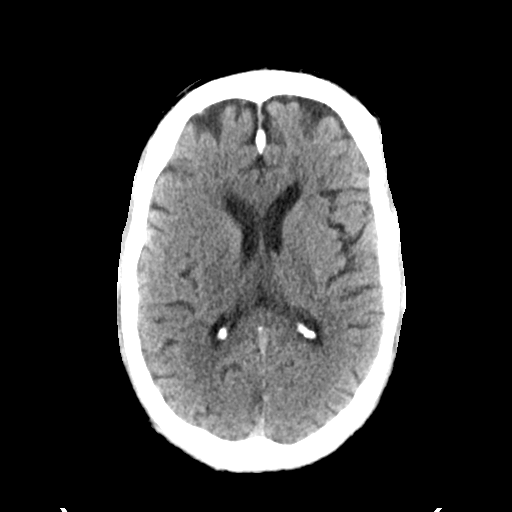
[im 22/35  brain]
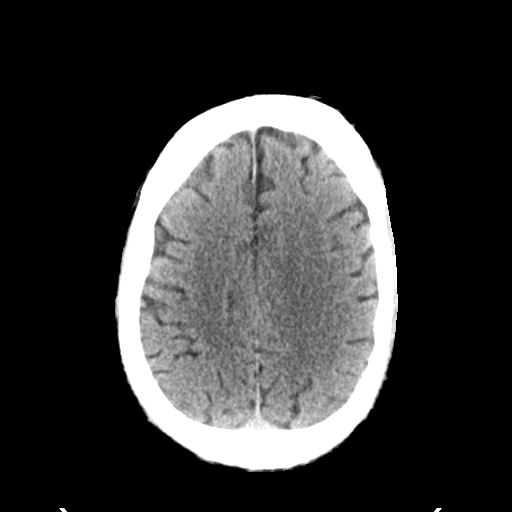
[im 22/35  bone]
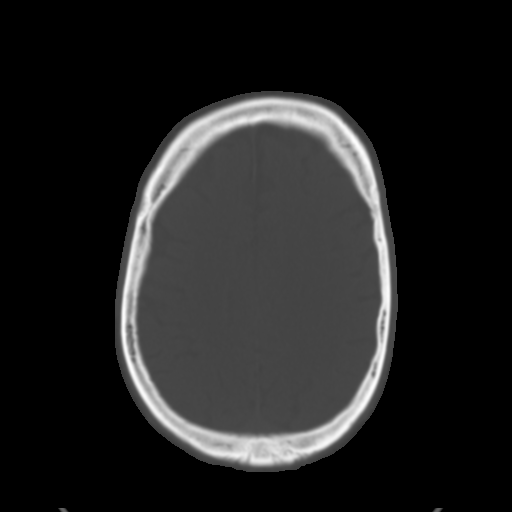
[im 26/35  brain]
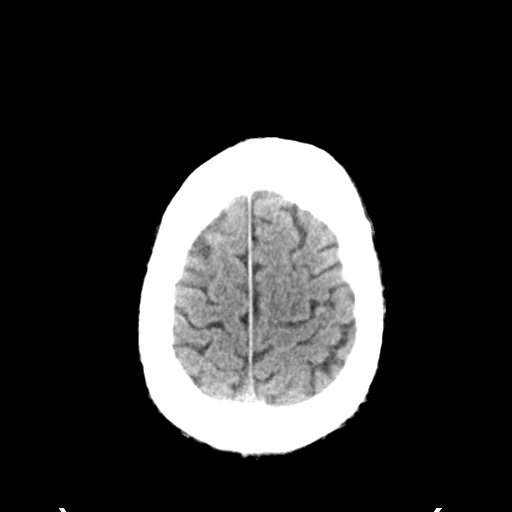
[im 30/35  brain]
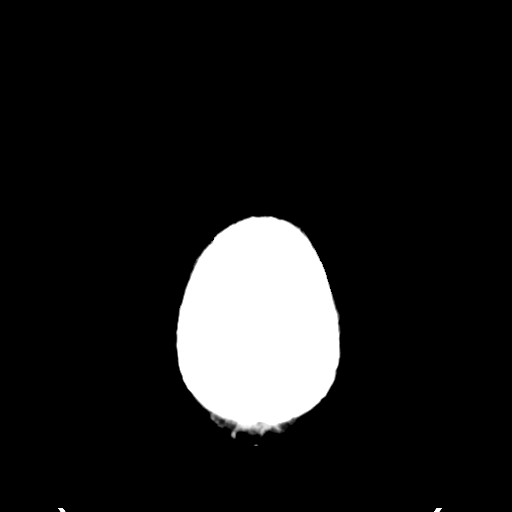

[Series 4: ax head bone · axial · 0.44mm/px · z∈[-11,+7]mm · 2 of 86 slices shown]
[im 9/86  bone]
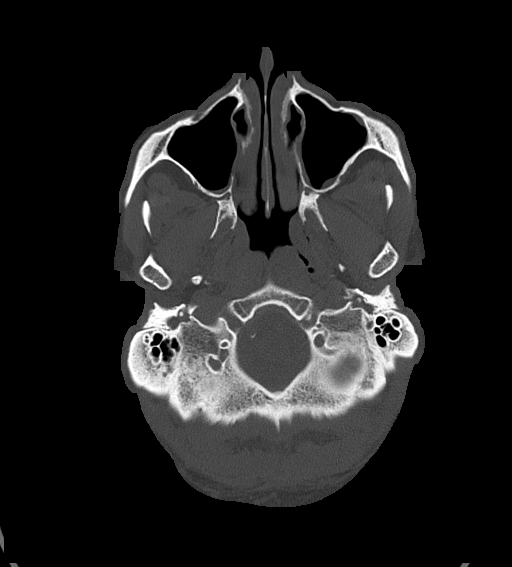
[im 18/86  bone]
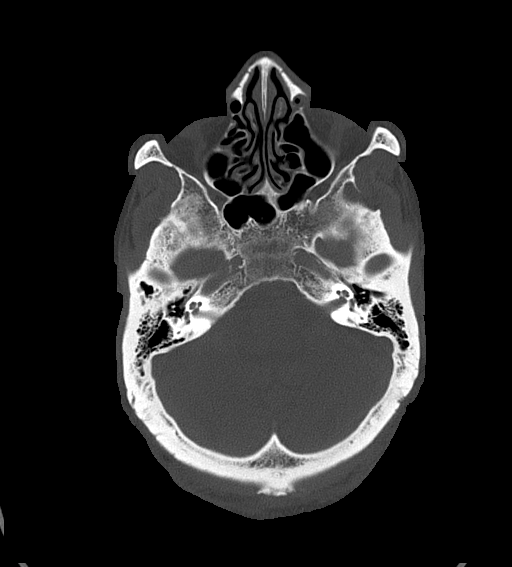

[Series 5: head without cor · coronal · non-contrast · 0.34mm/px · 3 of 71 slices shown]
[im 24/71  brain]
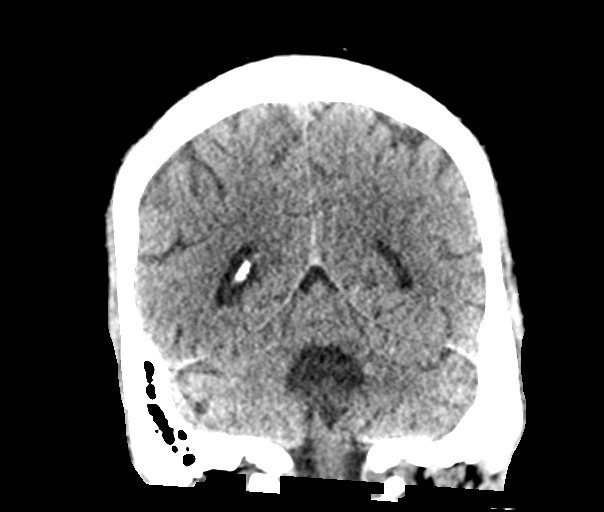
[im 32/71  brain]
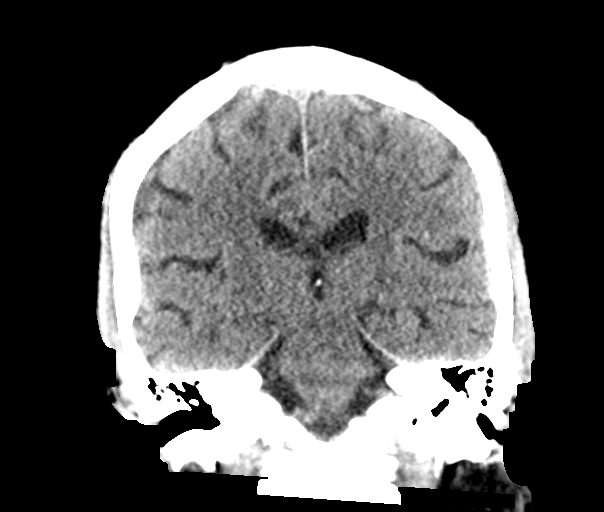
[im 39/71  brain]
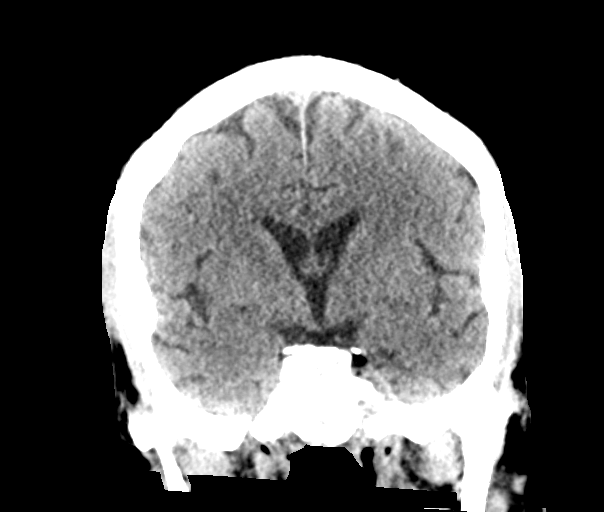

[Series 6: head without sag · sagittal · non-contrast · 0.34mm/px · 3 of 53 slices shown]
[im 18/53  brain]
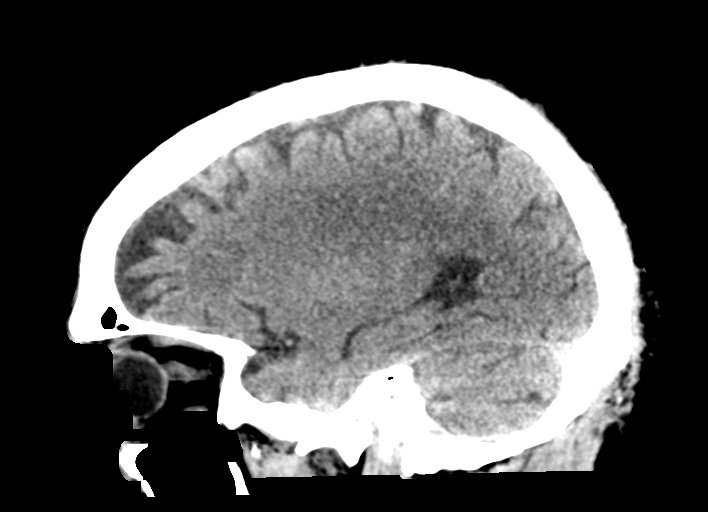
[im 27/53  brain]
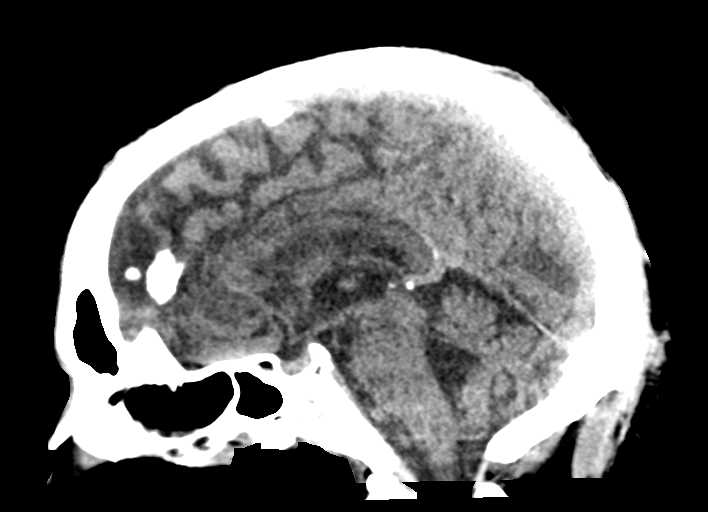
[im 35/53  brain]
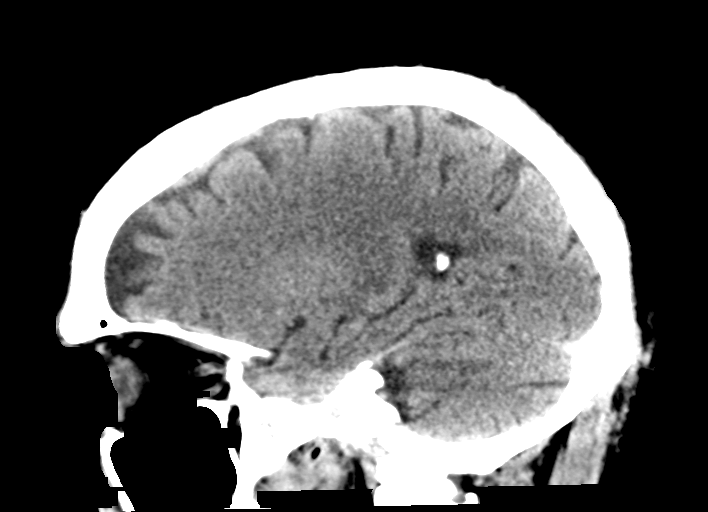

[15 of 47 positions shown; findings below may reference images not displayed]

FINDINGS: Brain: Generalized parenchymal volume loss with commensurate
dilatation of the ventricles and sulci. Mild chronic small vessel
ischemic changes within the bilateral periventricular white matter
regions.

No mass, hemorrhage, edema or other evidence of acute parenchymal
abnormality. No extra-axial hemorrhage.

Vascular: Chronic calcified atherosclerotic changes of the large
vessels at the skull base. No unexpected hyperdense vessel.

Skull: Normal. Negative for fracture or focal lesion.

Sinuses/Orbits: No acute finding.

Other: None.
IMPRESSION: 1. No acute findings. No intracranial mass, hemorrhage or edema. No
skull fracture.
2. Mild chronic small vessel ischemic changes in the white matter.

## 2021-02-24 IMAGING — DX DG CHEST 1V PORT
1 series · 1 of 1 positions shown · non-contrast
Comparison: [DATE]

CLINICAL DATA: Altered mental status

EXAM:
PORTABLE CHEST 1 VIEW

[chest ap]
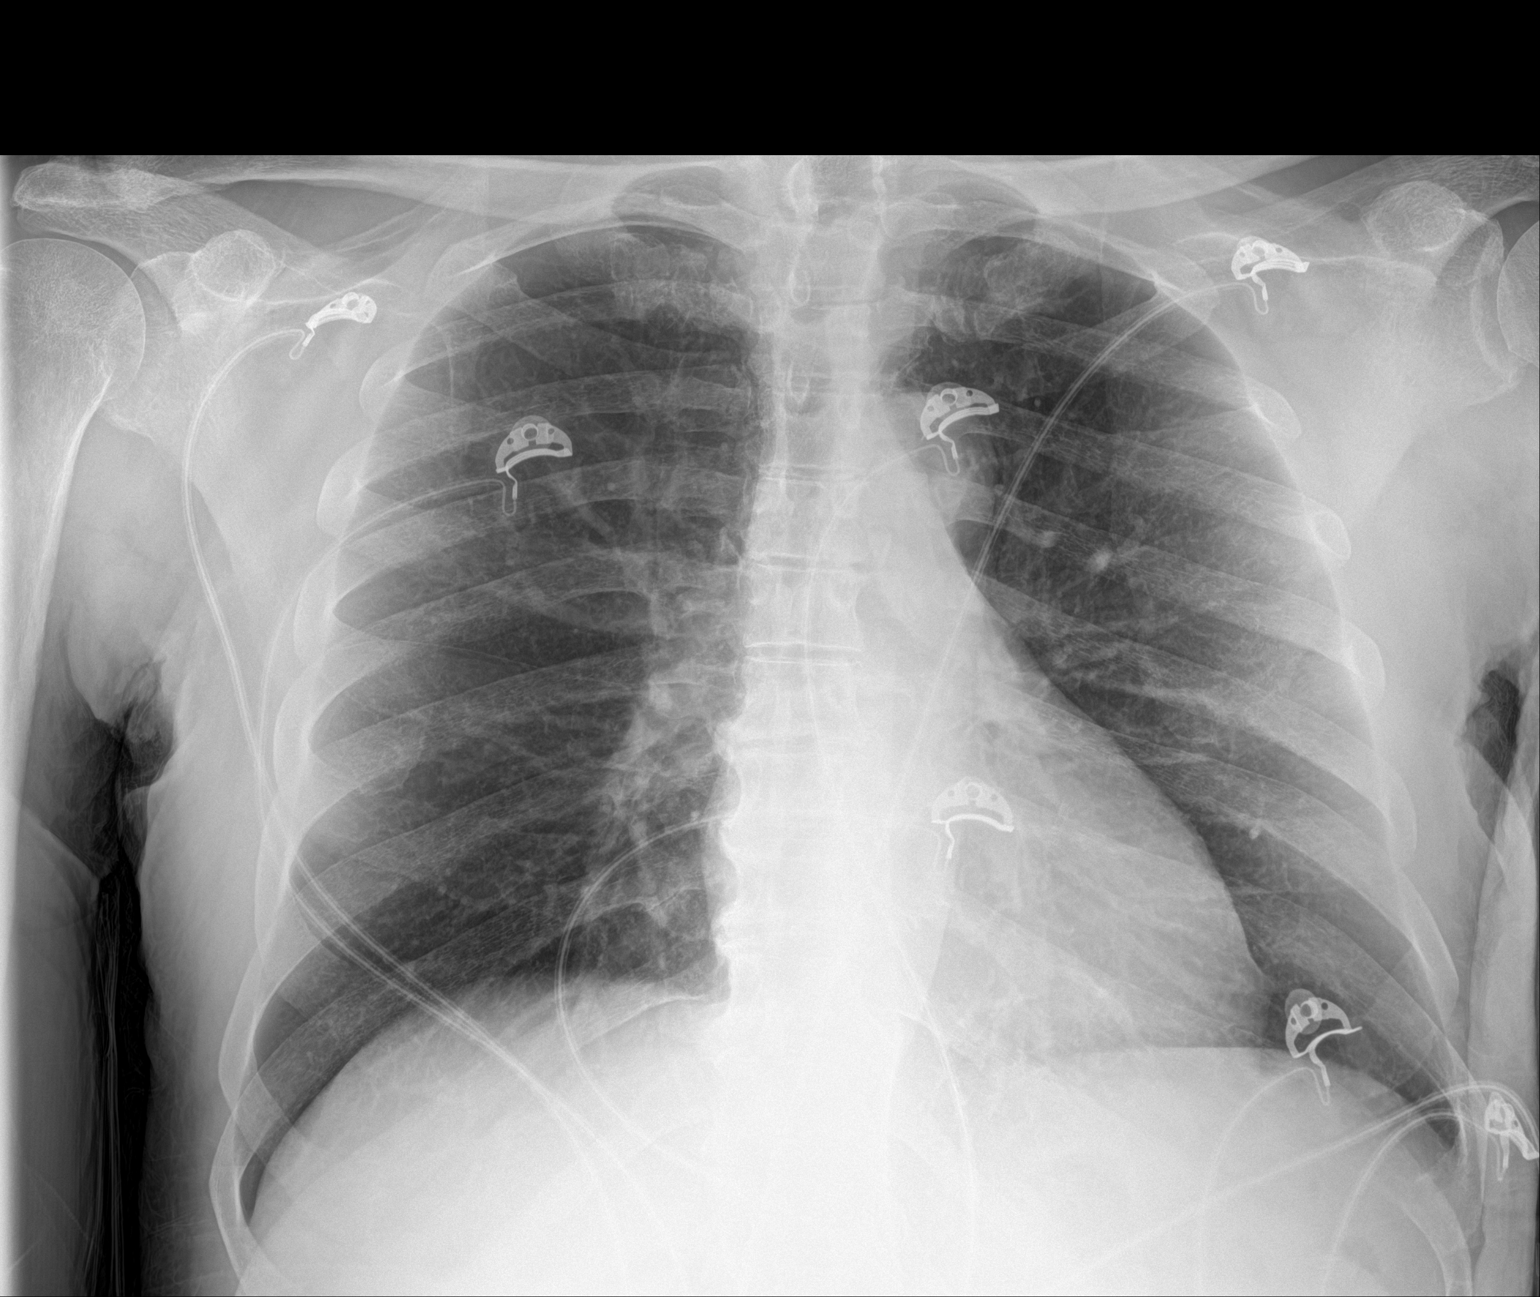

[1 of 1 positions shown; findings below may reference images not displayed]

FINDINGS: Numerous leads and wires project over the chest. Midline trachea.
Normal heart size. No pleural effusion or pneumothorax. Clear lungs.
IMPRESSION: No acute cardiopulmonary disease.

## 2021-02-24 MED ORDER — METOPROLOL SUCCINATE ER 25 MG PO TB24
12.5000 mg | ORAL_TABLET | Freq: Every day | ORAL | Status: DC
  Administered 2021-02-24: 12.5 mg via ORAL
  Filled 2021-02-24: qty 1

## 2021-02-24 NOTE — ED Notes (Signed)
Patient transported to CT 
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
## 2021-02-24 NOTE — ED Notes (Signed)
Patient had run of V tach. Patient was laying in bed watching tv and denies any pain. EDP given printed view and another copy placed in chart.

## 2021-02-24 NOTE — ED Provider Notes (Signed)
[facility] EMERGENCY DEPARTMENT Provider Note   CSN: [id number] Arrival date & time: 02/24/21  4098     History Chief Complaint  Patient presents with   Altered Mental Status    Antonio Navarro is a 60 y.o. male.  Patient sent in from [place].  For being combative.  EMS gave him 5 mg IM Versed.  Patient came in somewhat sedated.  Would open his eyes.  But did not seem to be baseline for him.  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  And ischemic cardiomyopathy.  But normally is quite alert.  The facility stated that he was being very disruptive and he would not take his meds.  About an hour after arrival patient was alert and cooperative.      Past Medical History:  Diagnosis Date   Hypertension    Kidney stones    MI (myocardial infarction) [facility])     Patient Active Problem List   Diagnosis Date Noted   Abdominal pain    Situational depression    Essential hypertension    E. coli UTI    Gait instability    DNR (do not resuscitate) discussion    Anoxic brain damage ([place])    Dysphagia    Cardiomyopathy, ischemic    History of ETT    Encephalopathy    Acute ST elevation myocardial infarction (STEMI) ([place])    Cardiac arrest (HCC)    Elevated blood pressure reading with diagnosis of hypertension    Shock circulatory ([place])     Past Surgical History:  Procedure Laterality Date   CORONARY/GRAFT ACUTE MI REVASCULARIZATION N/A 07/22/2020   Procedure: Coronary/Graft Acute MI Revascularization;  Surgeon: Lorretta Harp, MD;  Location: [place] [facility];  Service: Cardiovascular;  Laterality: N/A;   IR GASTROSTOMY TUBE MOD SED  08/17/2020   IR GASTROSTOMY TUBE REMOVAL  11/08/2020   LEFT HEART CATH AND CORONARY ANGIOGRAPHY N/A 07/22/2020   Procedure: LEFT HEART CATH AND CORONARY ANGIOGRAPHY;  Surgeon: Lorretta Harp, MD;  Location: [place] [facility];  Service:  Cardiovascular;  Laterality: N/A;       Family History  Problem Relation Age of Onset   Cancer Mother    Hypertension Father     Social History   Tobacco Use   Smoking status: Every Day    Packs/day: 1.00    Pack years: 0.00    Types: Cigarettes   Smokeless tobacco: Never  Vaping Use   Vaping Use: Never used  Substance Use Topics   Alcohol use: Yes    Comment: occasional   Drug use: Yes    Types: Marijuana    Comment: occ    Home Medications Prior to Admission medications   Medication Sig Start Date End Date Taking? Authorizing Provider  acetaminophen (TYLENOL) 325 MG tablet Take 2 tablets (650 mg total) by mouth every 6 (six) hours as needed for mild pain, fever or headache. 11/24/20   Samella Parr, NP  aspirin 81 MG chewable tablet Chew 1 tablet (81 mg total) by mouth daily. 11/24/20   Samella Parr, NP  atorvastatin (LIPITOR) 80 MG tablet Take 1 tablet (80 mg total) by mouth daily. 11/24/20   Samella Parr, NP  citalopram (CELEXA) 40 MG tablet Take 1 tablet (40 mg total) by mouth daily. 11/24/20   Samella Parr, NP  clopidogrel (PLAVIX) 75 MG tablet Take 1 tablet (  75 mg total) by mouth daily. 11/24/20   Samella Parr, NP  docusate sodium (COLACE) 100 MG capsule Take 1 capsule (100 mg total) by mouth 2 (two) times daily as needed for mild constipation. 11/24/20   Samella Parr, NP  feeding supplement (ENSURE ENLIVE / ENSURE PLUS) LIQD Take 237 mLs by mouth 2 (two) times daily between meals. 11/24/20   Samella Parr, NP  isosorbide mononitrate (IMDUR) 30 MG 24 hr tablet Take 1 tablet (30 mg total) by mouth daily. 11/24/20   Samella Parr, NP  metoprolol succinate (TOPROL-XL) 25 MG 24 hr tablet Take 0.5 tablets (12.5 mg total) by mouth daily. 11/24/20   Samella Parr, NP  Multiple Vitamin (MULTIVITAMIN WITH MINERALS) TABS tablet Take 1 tablet by mouth daily. 11/24/20   Samella Parr, NP  ondansetron (ZOFRAN ODT) 4 MG disintegrating tablet 4mg  ODT q4 hours prn  nausea/vomit 11/25/20   Drenda Freeze, MD  polyethylene glycol (MIRALAX / GLYCOLAX) 17 g packet Take 17 g by mouth daily as needed for moderate constipation. 11/24/20   Samella Parr, NP  QUEtiapine (SEROQUEL) 25 MG tablet Take 1 tablet (25 mg total) by mouth at bedtime. 11/24/20   Samella Parr, NP    Allergies    Patient has no known allergies.  Review of Systems   Review of Systems  Unable to perform ROS: Mental status change   Physical Exam Updated Vital Signs BP (!) 144/106 (BP Location: Right Arm)   Pulse 67   Temp (!) 97.5 F (36.4 C) (Axillary)   Resp 14   Ht 1.829 m (6')   Wt 81.6 kg   SpO2 96%   BMI 24.41 kg/m   Physical Exam Vitals and nursing note reviewed.  Constitutional:      Appearance: Normal appearance. He is well-developed.  HENT:     Head: Normocephalic and atraumatic.  Eyes:     Extraocular Movements: Extraocular movements intact.     Conjunctiva/sclera: Conjunctivae normal.     Pupils: Pupils are equal, round, and reactive to light.  Cardiovascular:     Rate and Rhythm: Normal rate and regular rhythm.     Heart sounds: No murmur heard. Pulmonary:     Effort: Pulmonary effort is normal. No respiratory distress.     Breath sounds: Normal breath sounds.  Abdominal:     Palpations: Abdomen is soft.     Tenderness: There is no abdominal tenderness.  Musculoskeletal:        General: Normal range of motion.     Cervical back: Normal range of motion and neck supple.  Skin:    General: Skin is warm and dry.  Neurological:     General: No focal deficit present.     Mental Status: He is alert. Mental status is at baseline.     Comments: According to family member patient is back to baseline    ED Results / Procedures / Treatments   Labs (all labs ordered are listed, but only abnormal results are displayed) Labs Reviewed - No data to display  EKG EKG Interpretation  Date/Time:  Saturday February 24 2021 09:54:18 EDT Ventricular Rate:  69 PR  Interval:  143 QRS Duration: 94 QT Interval:  402 QTC Calculation: 431 R Axis:   -42 Text Interpretation: Sinus rhythm Abnormal R-wave progression, early transition Inferior infarct, old No significant change since last tracing Confirmed by Fredia Sorrow [phone]) on 02/24/2021 10:01:40 AM  Radiology No results found.  Procedures  Procedures   Medications Ordered in ED Medications - No data to display  ED Course  I have reviewed the triage vital signs and the nursing notes.  Pertinent labs & imaging results that were available during my care of the patient were reviewed by me and considered in my medical decision making (see chart for details).    MDM Rules/Calculators/A&P                          Approximately 1 hour after arrival patient suddenly was awake and was acting baseline according to family members.  Still has some speech difficulties.  But very cooperative.  Work-up here without any acute findings COVID testing negative urinalysis negative.  Complete metabolic panel was normal.  CBC no leukocytosis.  Hemoglobin 12.3.  Chest x-ray without any acute cardiopulmonary findings.  CT head without any acute findings no intracranial mass hemorrhage or edema no skull fracture.   Patient was observed for a period of time.  He remained quite stable.  Family member felt that he was fine.  Blood pressures are running a little on the high side so we gave him his normal blood pressure medication prior to discharge.  Patient stable for discharge back to [facility] Final Clinical Impression(s) / ED Diagnoses Final diagnoses:  None    Rx / DC Orders ED Discharge Orders     None        Fredia Sorrow, MD 02/24/21 1542

## 2021-02-24 NOTE — Discharge Instructions (Addendum)
Patient now back to normal.  Patient's work-up without any significant findings.  Patient did receive his blood pressure medicine here prior to discharge.  Patient stable to discharge back to [facility].

## 2021-02-28 ENCOUNTER — Other Ambulatory Visit: Payer: Self-pay

## 2021-02-28 ENCOUNTER — Ambulatory Visit (HOSPITAL_COMMUNITY)
Admission: RE | Admit: 2021-02-28 | Discharge: 2021-02-28 | Disposition: A | Discharge status: Home / Self Care | Payer: Medicaid Other | Source: Ambulatory Visit | Attending: Internal Medicine | Admitting: Internal Medicine

## 2021-02-28 DIAGNOSIS — R1310 Dysphagia, unspecified: Secondary | ICD-10-CM | POA: Diagnosis present

## 2021-02-28 NOTE — Progress Notes (Signed)
Modified Barium Swallow Progress Note  Patient Details  Name: Antonio Navarro MRN: [id number] Date of Birth: 1961/03/10  Today's Date: 02/28/2021  Modified Barium Swallow completed.  Full report located under Chart Review in the Imaging Section.  Brief recommendations include the following:  Clinical Impression  Pt presents with minimal oral dysphagia with functional pharyngeal swallowing. He has mild lingual residue with consecutive sips of thin liquids, but he clears this on his own. He also had oral holding of the barium tablet, orally expectorating it when he did not swallow it. Pharyngeally he has adequate efficiency with intermittent, trace penetration that clears spontaneously upon completion of the swallow (PAS 2). No aspiration occurs, even when pt is allowed to self-pace at a rapid rate. He did have a single instance of coughing immediately after swallowing a bite of graham cracker, but this was not associated with any airway invasion or residuals. Recommend advancement up to regular solids and thin liquids at the discretion of primary SLP.   Swallow Evaluation Recommendations       SLP Diet Recommendations: Regular solids;Thin liquid   Liquid Administration via: Straw;Cup   Medication Administration: Whole meds with puree (may need to crush if orally holding)   Supervision: Staff to assist with self feeding   Compensations: Minimize environmental distractions;Slow rate;Small sips/bites   Postural Changes: Seated upright at 90 degrees   Oral Care Recommendations: Oral care BID        Osie Bond., M.A. [place] Pager [phone] Office [phone]  02/28/2021,2:30 PM

## 2021-04-06 ENCOUNTER — Emergency Department (HOSPITAL_COMMUNITY): Payer: Medicaid Other

## 2021-04-06 ENCOUNTER — Emergency Department (HOSPITAL_COMMUNITY)
Admission: EM | Admit: 2021-04-06 | Discharge: 2021-04-07 | Disposition: A | Discharge status: Home / Self Care | Payer: Medicaid Other | Attending: Emergency Medicine | Admitting: Emergency Medicine

## 2021-04-06 ENCOUNTER — Other Ambulatory Visit: Payer: Self-pay

## 2021-04-06 DIAGNOSIS — S0181XA Laceration without foreign body of other part of head, initial encounter: Secondary | ICD-10-CM

## 2021-04-06 DIAGNOSIS — Z7902 Long term (current) use of antithrombotics/antiplatelets: Secondary | ICD-10-CM | POA: Diagnosis not present

## 2021-04-06 DIAGNOSIS — W050XXA Fall from non-moving wheelchair, initial encounter: Secondary | ICD-10-CM | POA: Insufficient documentation

## 2021-04-06 DIAGNOSIS — S0990XA Unspecified injury of head, initial encounter: Secondary | ICD-10-CM | POA: Diagnosis present

## 2021-04-06 DIAGNOSIS — R791 Abnormal coagulation profile: Secondary | ICD-10-CM | POA: Diagnosis not present

## 2021-04-06 DIAGNOSIS — W19XXXA Unspecified fall, initial encounter: Secondary | ICD-10-CM

## 2021-04-06 LAB — CBC WITH DIFFERENTIAL/PLATELET
Abs Immature Granulocytes: 0.02 10*3/uL (ref 0.00–0.07)
Basophils Absolute: 0.1 10*3/uL (ref 0.0–0.1)
Basophils Relative: 1 %
Eosinophils Absolute: 0.2 10*3/uL (ref 0.0–0.5)
Eosinophils Relative: 3 %
HCT: 39.5 % (ref 39.0–52.0)
Hemoglobin: 13.2 g/dL (ref 13.0–17.0)
Immature Granulocytes: 0 %
Lymphocytes Relative: 45 %
Lymphs Abs: 3 10*3/uL (ref 0.7–4.0)
MCH: 31.9 pg (ref 26.0–34.0)
MCHC: 33.4 g/dL (ref 30.0–36.0)
MCV: 95.4 fL (ref 80.0–100.0)
Monocytes Absolute: 0.5 10*3/uL (ref 0.1–1.0)
Monocytes Relative: 8 %
Neutro Abs: 2.8 10*3/uL (ref 1.7–7.7)
Neutrophils Relative %: 43 %
Platelets: 156 10*3/uL (ref 150–400)
RBC: 4.14 MIL/uL — ABNORMAL LOW (ref 4.22–5.81)
RDW: 12.7 % (ref 11.5–15.5)
WBC: 6.7 10*3/uL (ref 4.0–10.5)
nRBC: 0 % (ref 0.0–0.2)

## 2021-04-06 LAB — BASIC METABOLIC PANEL
Anion gap: 7 (ref 5–15)
BUN: 13 mg/dL (ref 6–20)
CO2: 26 mmol/L (ref 22–32)
Calcium: 8.9 mg/dL (ref 8.9–10.3)
Chloride: 102 mmol/L (ref 98–111)
Creatinine, Ser: 1.27 mg/dL — ABNORMAL HIGH (ref 0.61–1.24)
GFR, Estimated: >60 mL/min (ref >60)
Glucose, Bld: 94 mg/dL (ref 70–99)
Potassium: 3.7 mmol/L (ref 3.5–5.1)
Sodium: 135 mmol/L (ref 135–145)

## 2021-04-06 LAB — PROTIME-INR
INR: 1 (ref 0.8–1.2)
Prothrombin Time: 13 seconds (ref 11.4–15.2)

## 2021-04-06 IMAGING — CT CT HEAD W/O CM
4 series · 16 of 47 positions shown, 18 images · non-contrast
Comparison: None.

CLINICAL DATA: Trauma.

EXAM:
CT HEAD WITHOUT CONTRAST
CT CERVICAL SPINE WITHOUT CONTRAST
TECHNIQUE: Multidetector CT imaging of the head and cervical spine was
performed following the standard protocol without intravenous
contrast. Multiplanar CT image reconstructions of the cervical spine
were also generated.

[Series 2: head wo · axial · 0.46mm/px · z∈[-114,+12]mm · 7 of 35 slices shown, 9 images]
[im 5/35  brain]
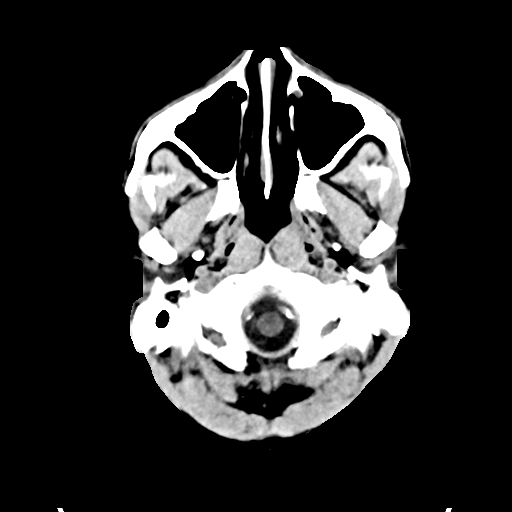
[im 5/35  bone]
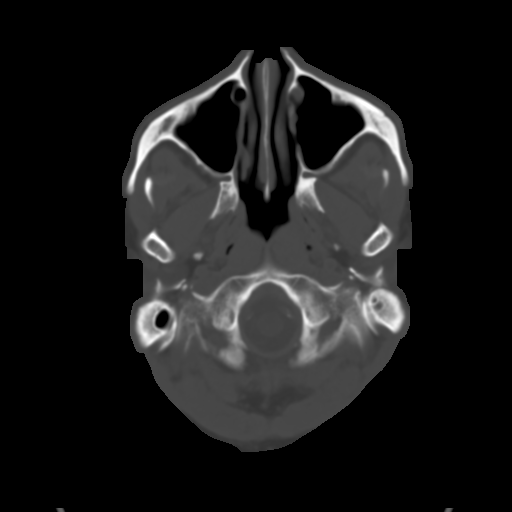
[im 9/35  brain]
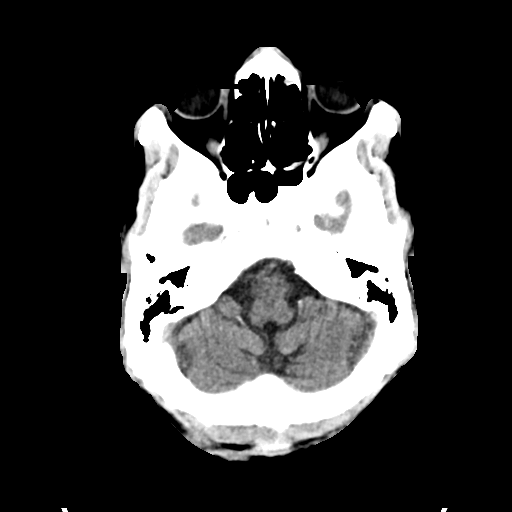
[im 13/35  brain]
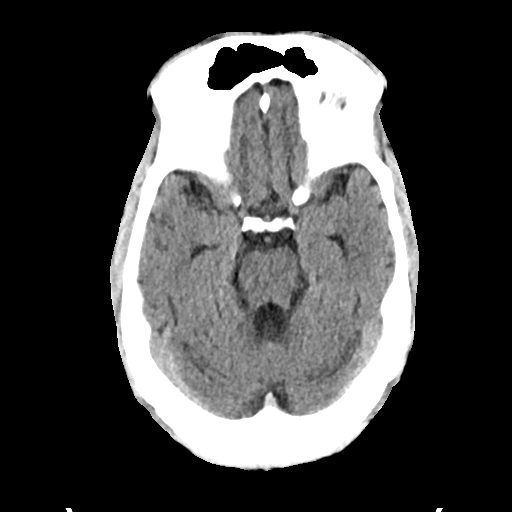
[im 18/35  brain]
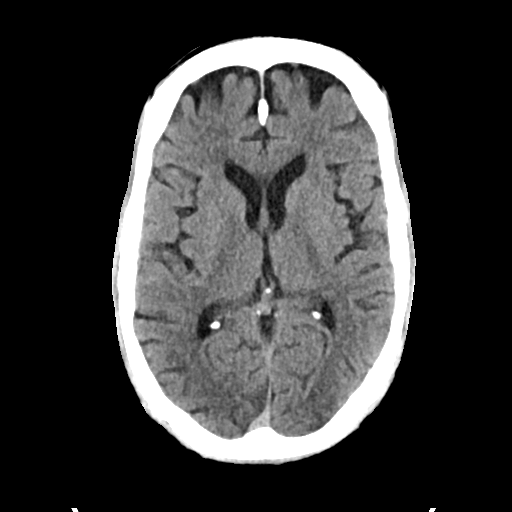
[im 22/35  brain]
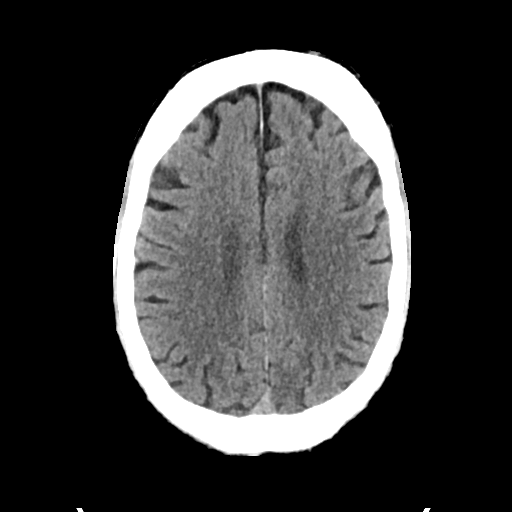
[im 22/35  bone]
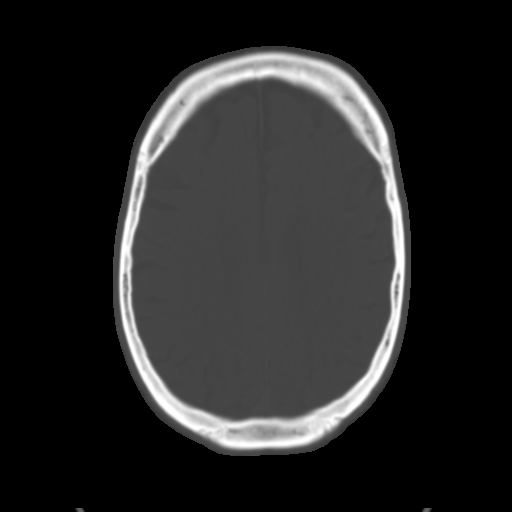
[im 26/35  brain]
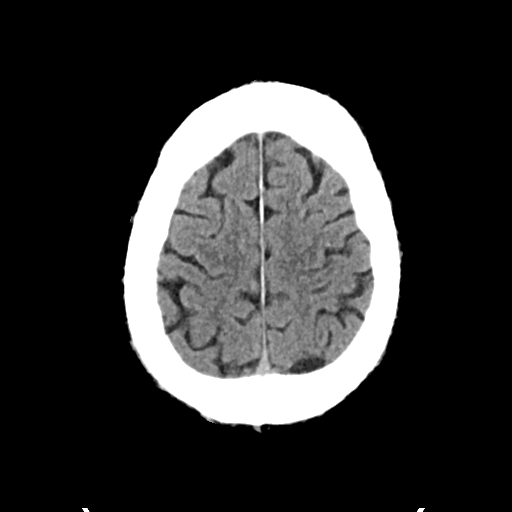
[im 30/35  brain]
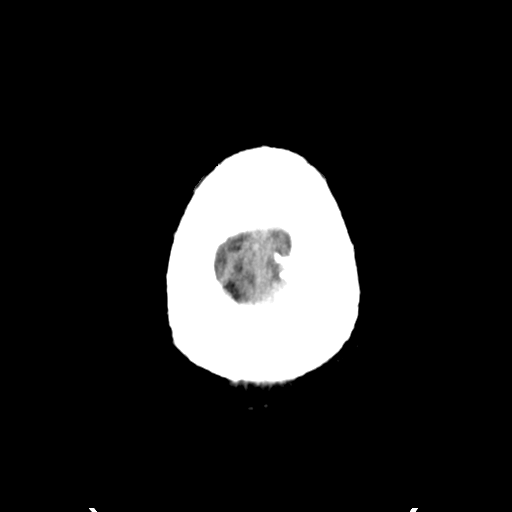

[Series 3: head bone · axial · 0.46mm/px · z∈[-118,-84]mm · 3 of 86 slices shown]
[im 9/86  bone]
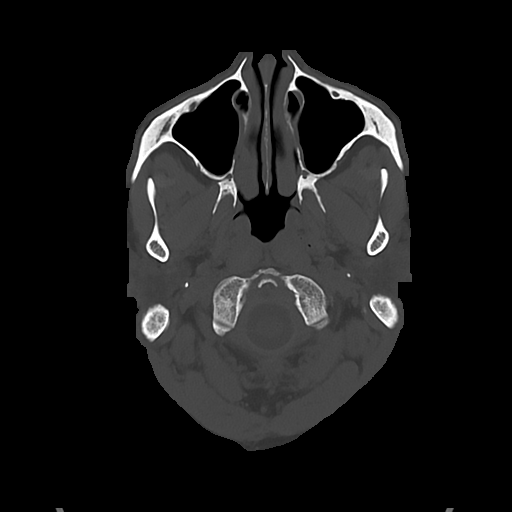
[im 18/86  bone]
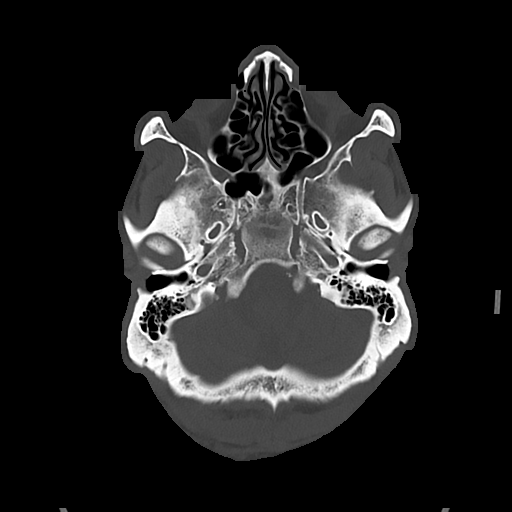
[im 26/86  bone]
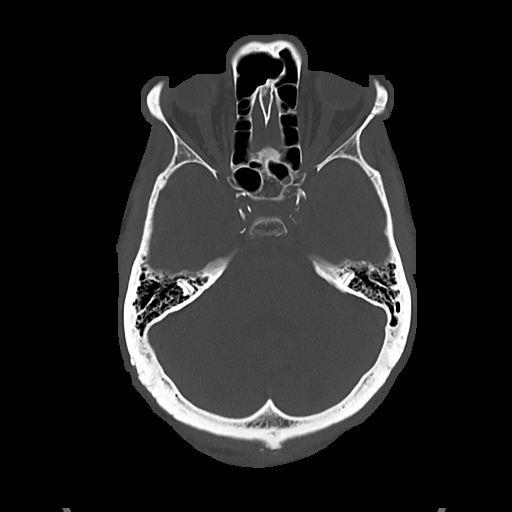

[Series 4: cor soft · coronal · 0.33mm/px · 3 of 75 slices shown]
[im 25/75  brain]
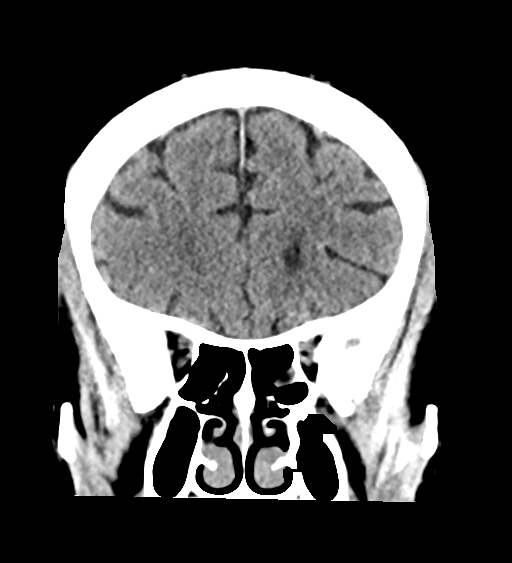
[im 33/75  brain]
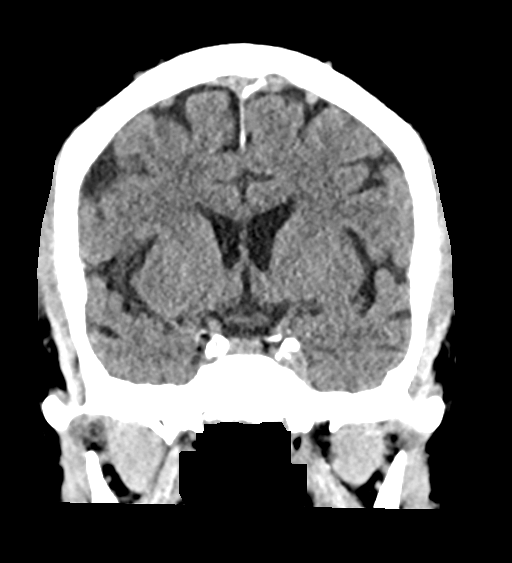
[im 42/75  brain]
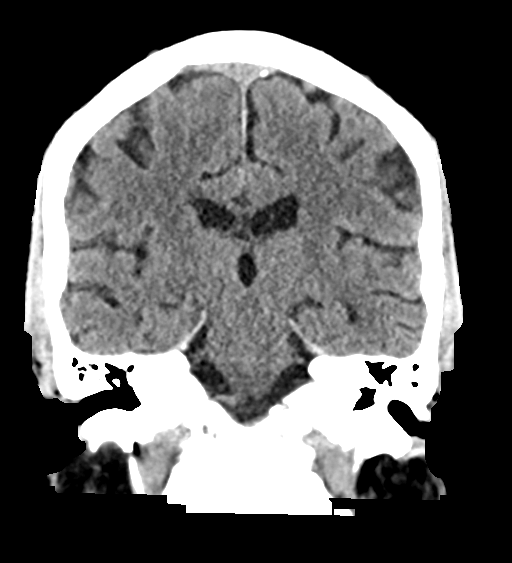

[Series 5: sag soft · sagittal · 0.38mm/px · 3 of 57 slices shown]
[im 19/57  brain]
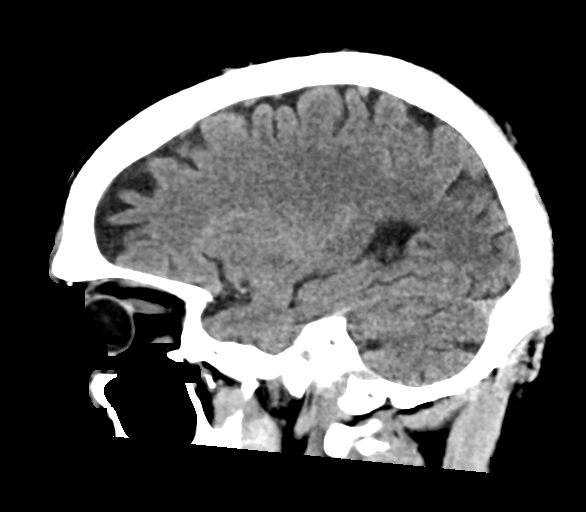
[im 29/57  brain]
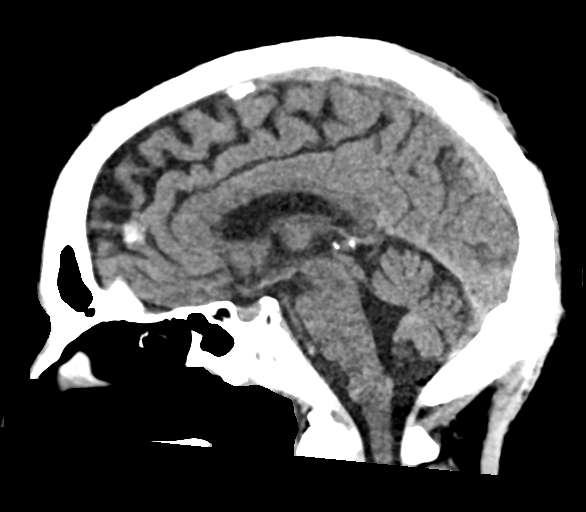
[im 38/57  brain]
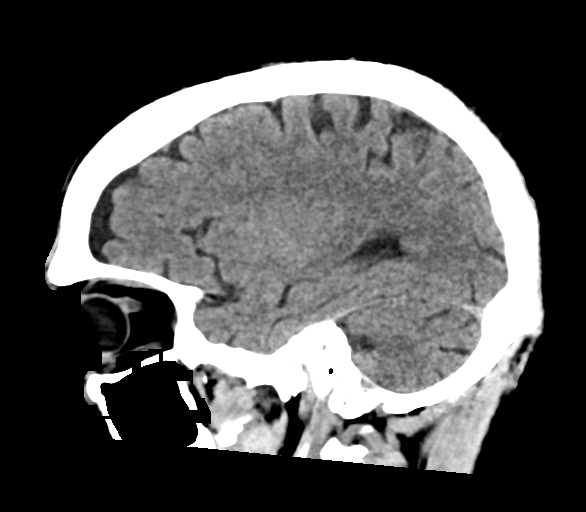

[16 of 47 positions shown; findings below may reference images not displayed]

FINDINGS: CT HEAD FINDINGS

Brain:

Cerebral ventricle sizes are concordant with the degree of cerebral
volume loss.

No evidence of large-territorial acute infarction. No parenchymal
hemorrhage. No mass lesion. No extra-axial collection.

No mass effect or midline shift. No hydrocephalus. Basilar cisterns
are patent.

Vascular: No hyperdense vessel. Atherosclerotic calcifications are
present within the cavernous internal carotid arteries.

Skull: No acute fracture or focal lesion.

Sinuses/Orbits: Paranasal sinuses and mastoid air cells are clear.
The orbits are unremarkable.

Other: Mild diffuse soft tissue edema with foci of gas along the
right frontal scalp.

CT CERVICAL SPINE FINDINGS

Alignment: Straightening of the normal cervical lordosis likely due
to positioning and degenerative changes.

Skull base and vertebrae: Multilevel mild to moderate degenerative
changes. No acute fracture. No aggressive appearing focal osseous
lesion or focal pathologic process.

Soft tissues and spinal canal: No prevertebral fluid or swelling. No
visible canal hematoma.

Upper chest: Unremarkable.

Other: None.
IMPRESSION: 1. No acute intracranial abnormality.
2. No acute displaced fracture or traumatic listhesis of the
cervical spine.

## 2021-04-06 IMAGING — CT CT CERVICAL SPINE W/O CM
4 series · 15 of 33 positions shown, 18 images · non-contrast
Comparison: None.

CLINICAL DATA: Trauma.

EXAM:
CT HEAD WITHOUT CONTRAST
CT CERVICAL SPINE WITHOUT CONTRAST
TECHNIQUE: Multidetector CT imaging of the head and cervical spine was
performed following the standard protocol without intravenous
contrast. Multiplanar CT image reconstructions of the cervical spine
were also generated.

[Series 6: c spine soft · axial · 0.30mm/px · z∈[-252,-214]mm · 2 of 113 slices shown]
[im 19/113  soft-tissue]
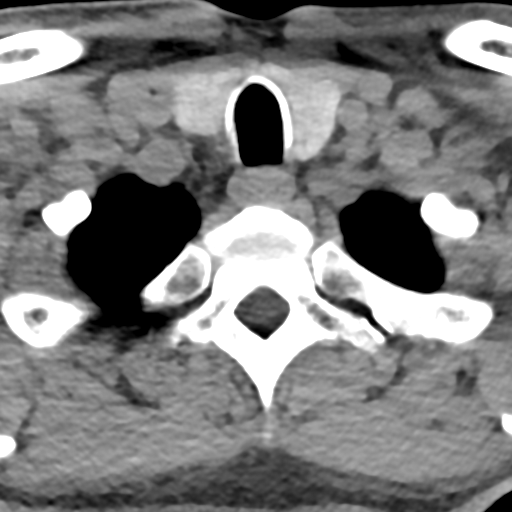
[im 38/113  soft-tissue]
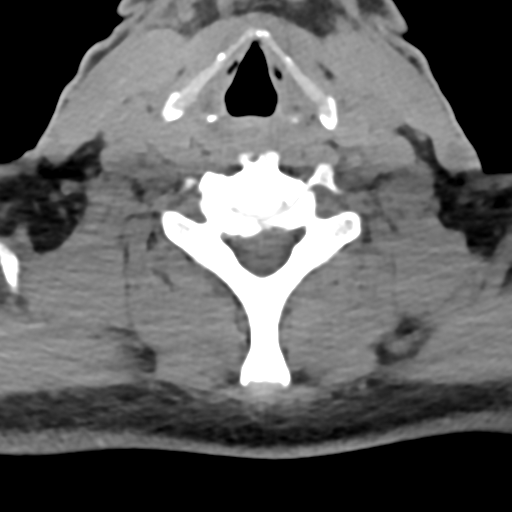

[Series 9: sag bone · sagittal · 0.39mm/px · 5 of 87 slices shown, 6 images]
[im 29/87  bone]
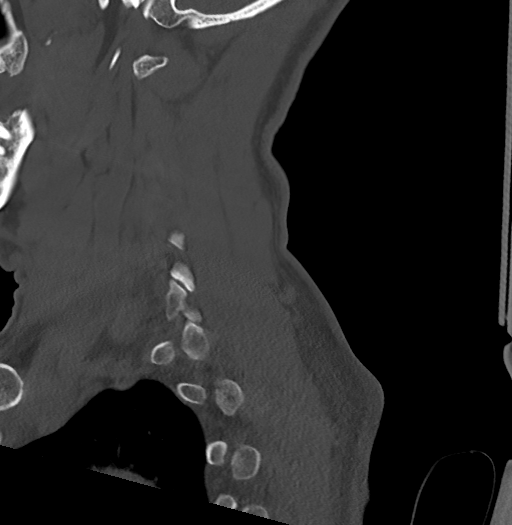
[im 36/87  bone]
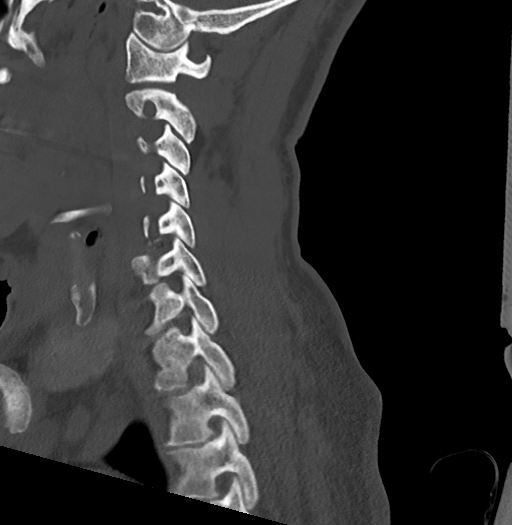
[im 44/87  soft-tissue]
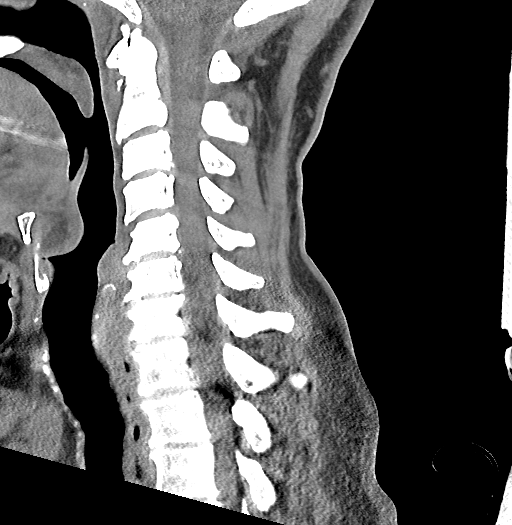
[im 44/87  bone]
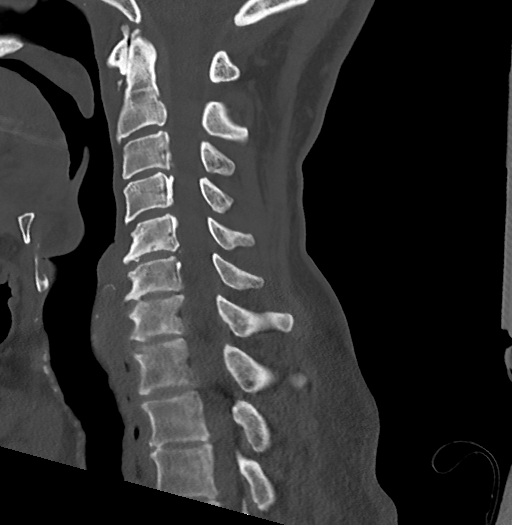
[im 51/87  bone]
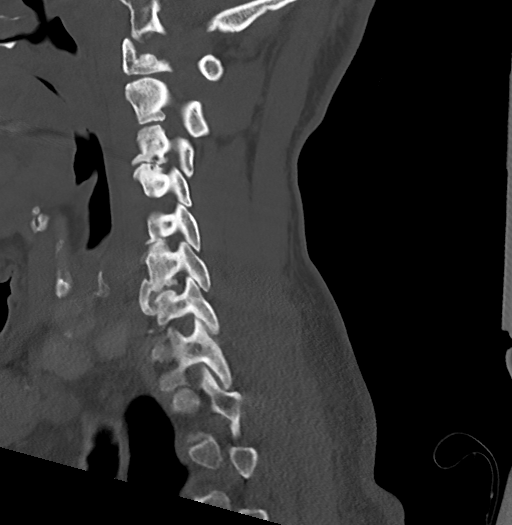
[im 58/87  bone]
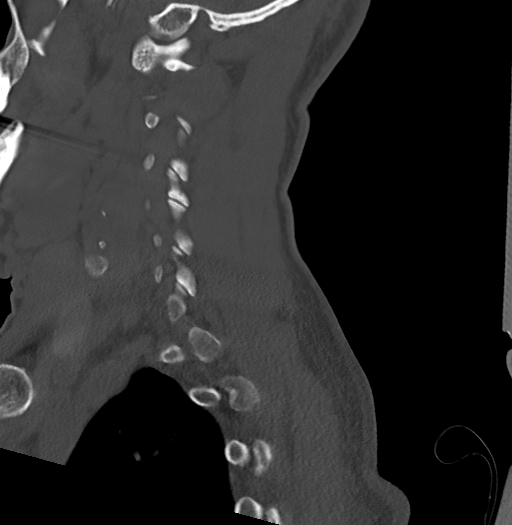

[Series 10: cor bone · coronal · 0.30mm/px · 3 of 87 slices shown]
[im 20/87  bone]
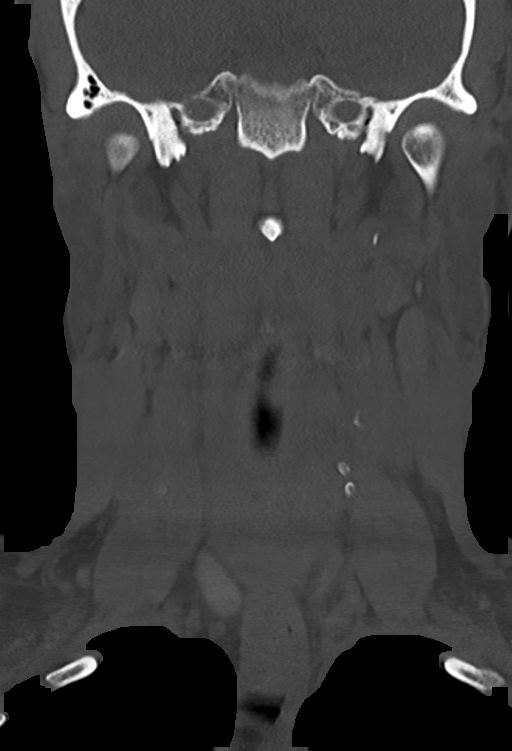
[im 36/87  bone]
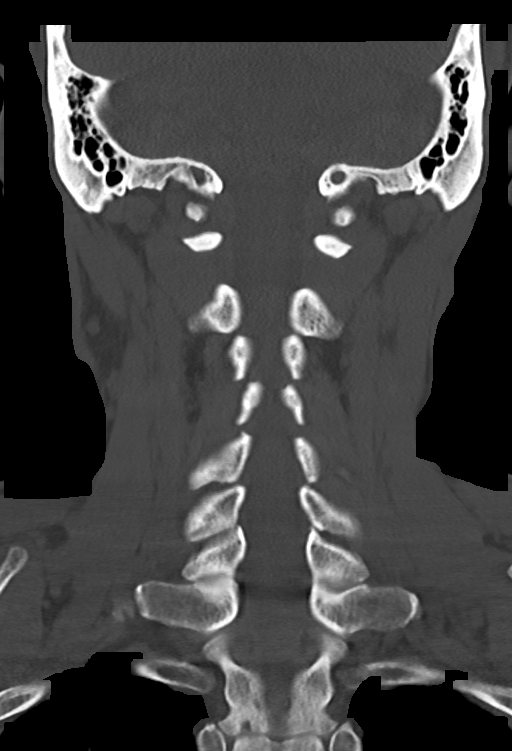
[im 52/87  bone]
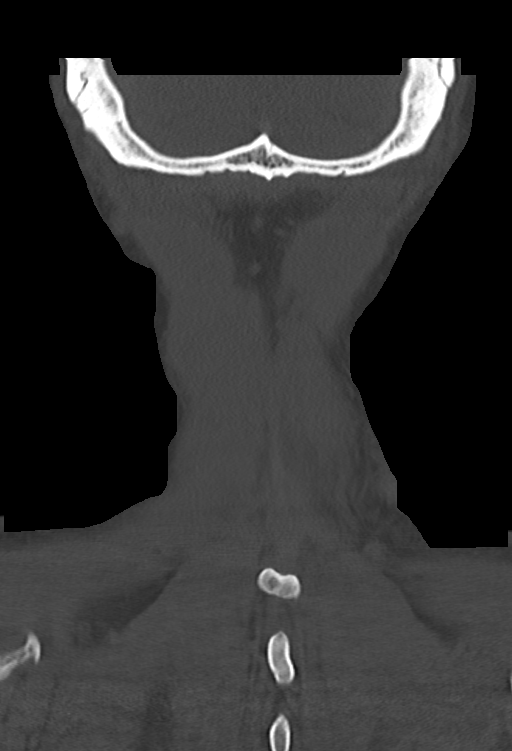

[Series 11: orthogonal axials · axial · 0.21mm/px · z∈[-272,-153]mm · 5 of 101 slices shown, 7 images]
[im 17/101  soft-tissue]
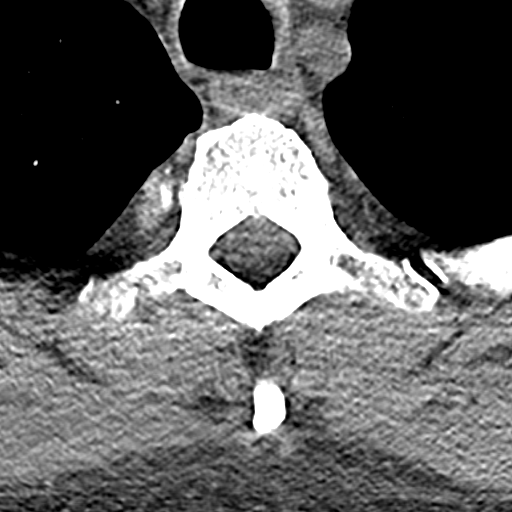
[im 17/101  bone]
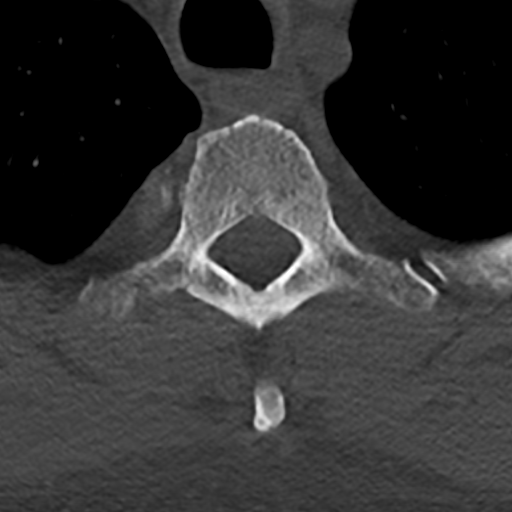
[im 34/101  bone]
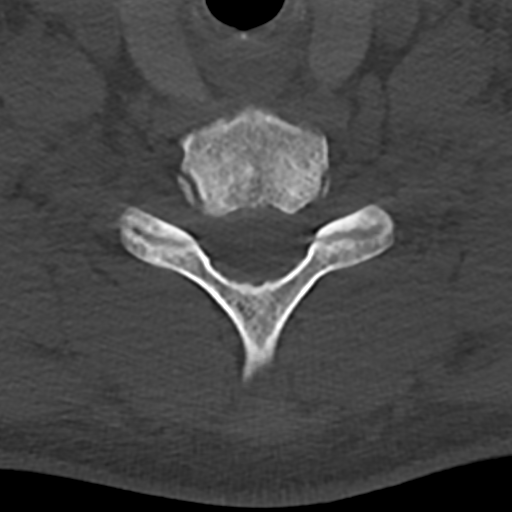
[im 51/101  bone]
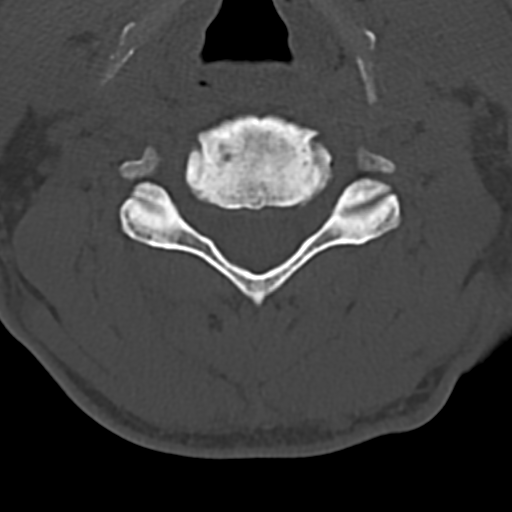
[im 67/101  bone]
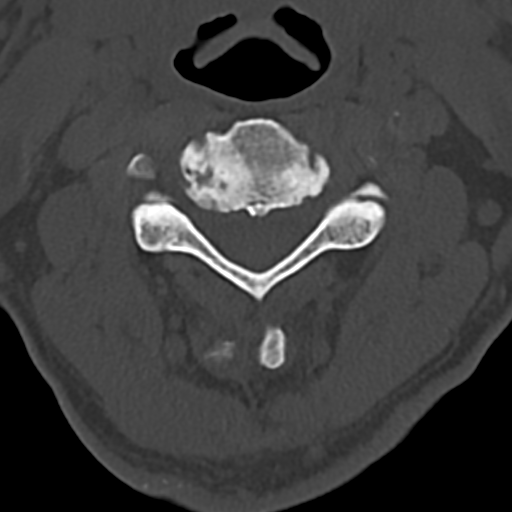
[im 84/101  soft-tissue]
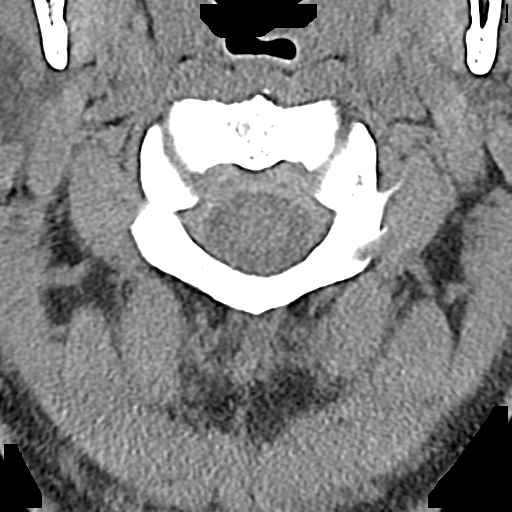
[im 84/101  bone]
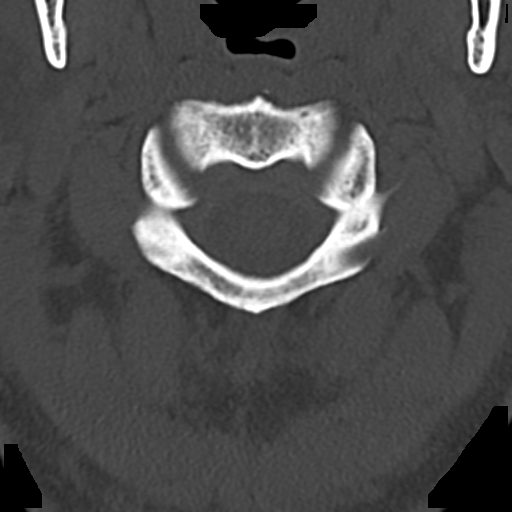

[15 of 33 positions shown; findings below may reference images not displayed]

FINDINGS: CT HEAD FINDINGS

Brain:

Cerebral ventricle sizes are concordant with the degree of cerebral
volume loss.

No evidence of large-territorial acute infarction. No parenchymal
hemorrhage. No mass lesion. No extra-axial collection.

No mass effect or midline shift. No hydrocephalus. Basilar cisterns
are patent.

Vascular: No hyperdense vessel. Atherosclerotic calcifications are
present within the cavernous internal carotid arteries.

Skull: No acute fracture or focal lesion.

Sinuses/Orbits: Paranasal sinuses and mastoid air cells are clear.
The orbits are unremarkable.

Other: Mild diffuse soft tissue edema with foci of gas along the
right frontal scalp.

CT CERVICAL SPINE FINDINGS

Alignment: Straightening of the normal cervical lordosis likely due
to positioning and degenerative changes.

Skull base and vertebrae: Multilevel mild to moderate degenerative
changes. No acute fracture. No aggressive appearing focal osseous
lesion or focal pathologic process.

Soft tissues and spinal canal: No prevertebral fluid or swelling. No
visible canal hematoma.

Upper chest: Unremarkable.

Other: None.
IMPRESSION: 1. No acute intracranial abnormality.
2. No acute displaced fracture or traumatic listhesis of the
cervical spine.

## 2021-04-06 IMAGING — DX DG CHEST 1V PORT
1 series · 1 of 1 positions shown · non-contrast
Comparison: Chest radiograph dated [DATE].

CLINICAL DATA: Fall.

EXAM:
PORTABLE CHEST 1 VIEW

[chest ap]
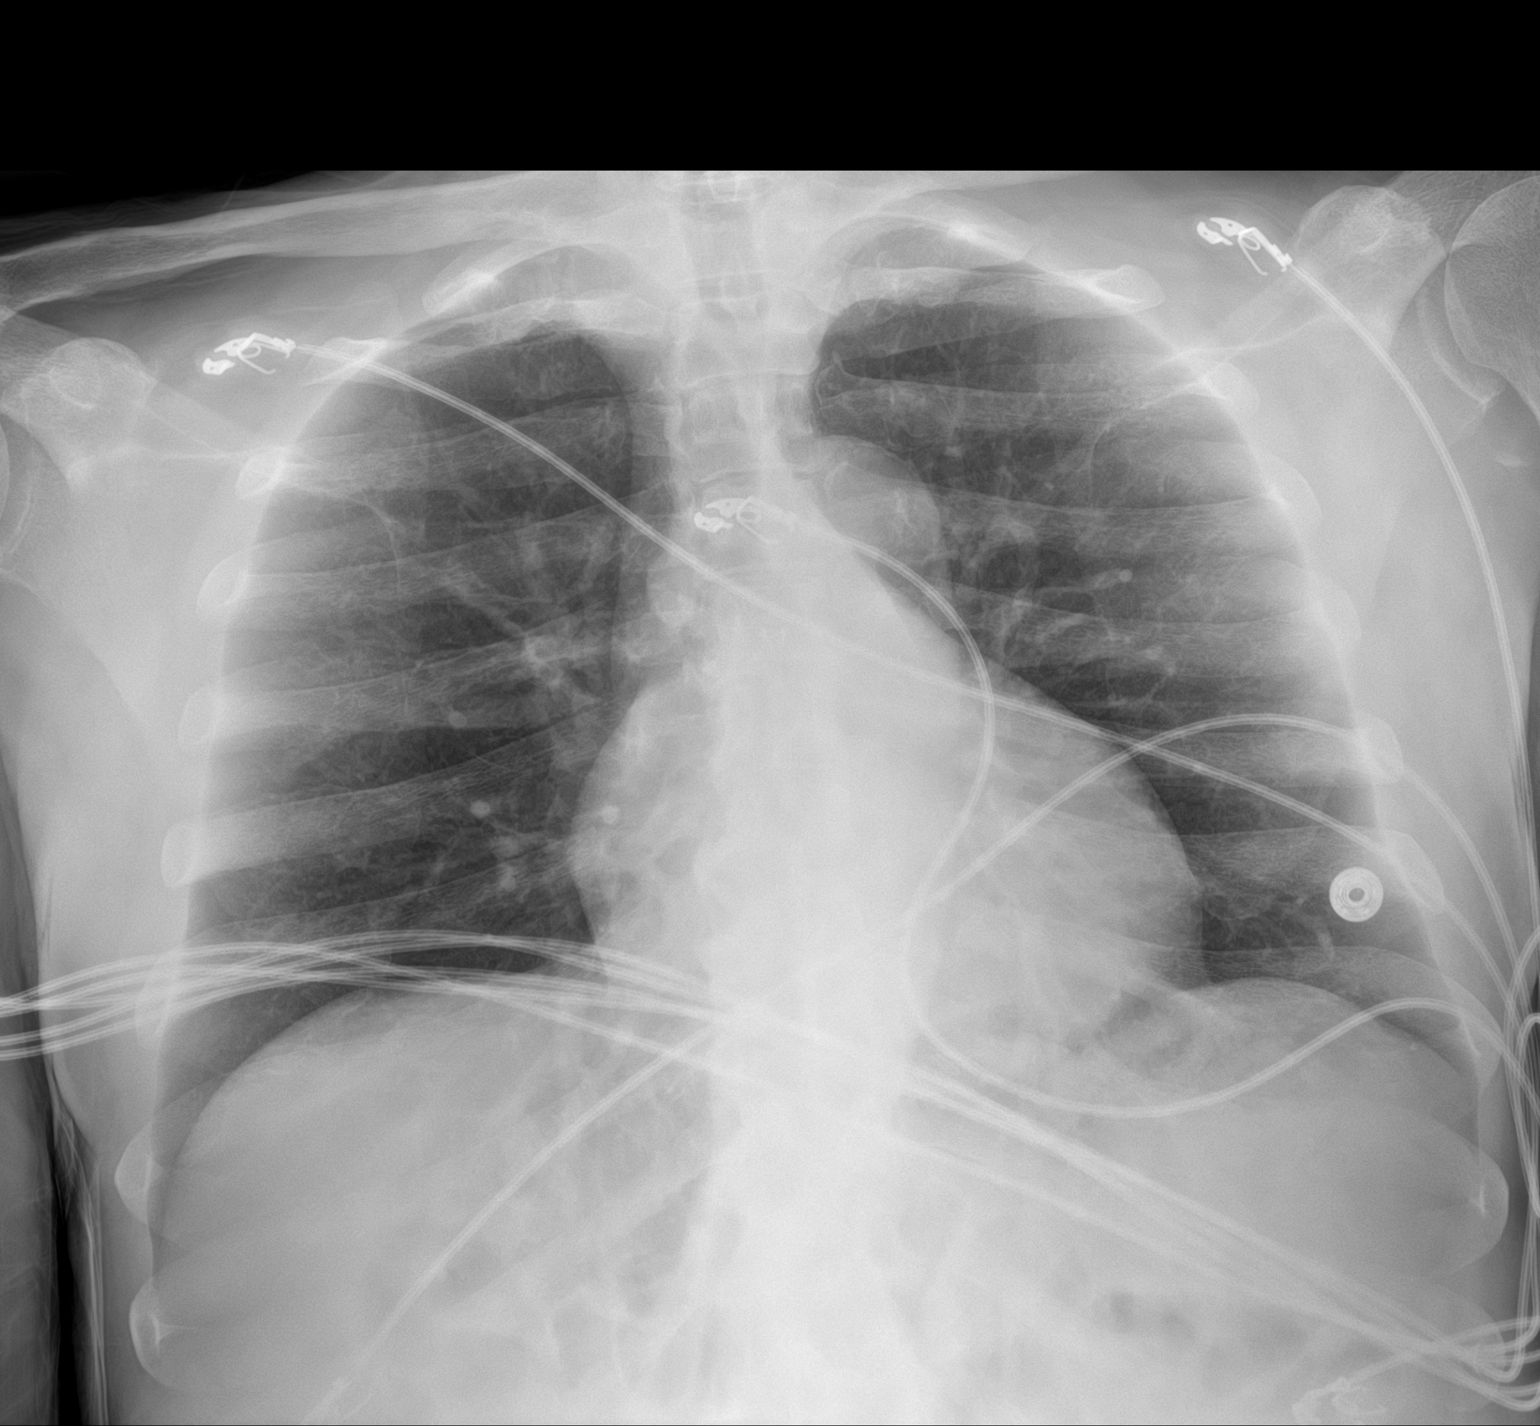

[1 of 1 positions shown; findings below may reference images not displayed]

FINDINGS: The lungs are clear. There is no pleural effusion pneumothorax. The
cardiac silhouette is within limits. No acute osseous pathology.
IMPRESSION: No active disease.

## 2021-04-06 IMAGING — DX DG PORTABLE PELVIS
1 series · 1 of 1 positions shown · non-contrast
Comparison: None.

CLINICAL DATA: Fall.

EXAM:
PORTABLE PELVIS 1-2 VIEWS

[pelvis ap]
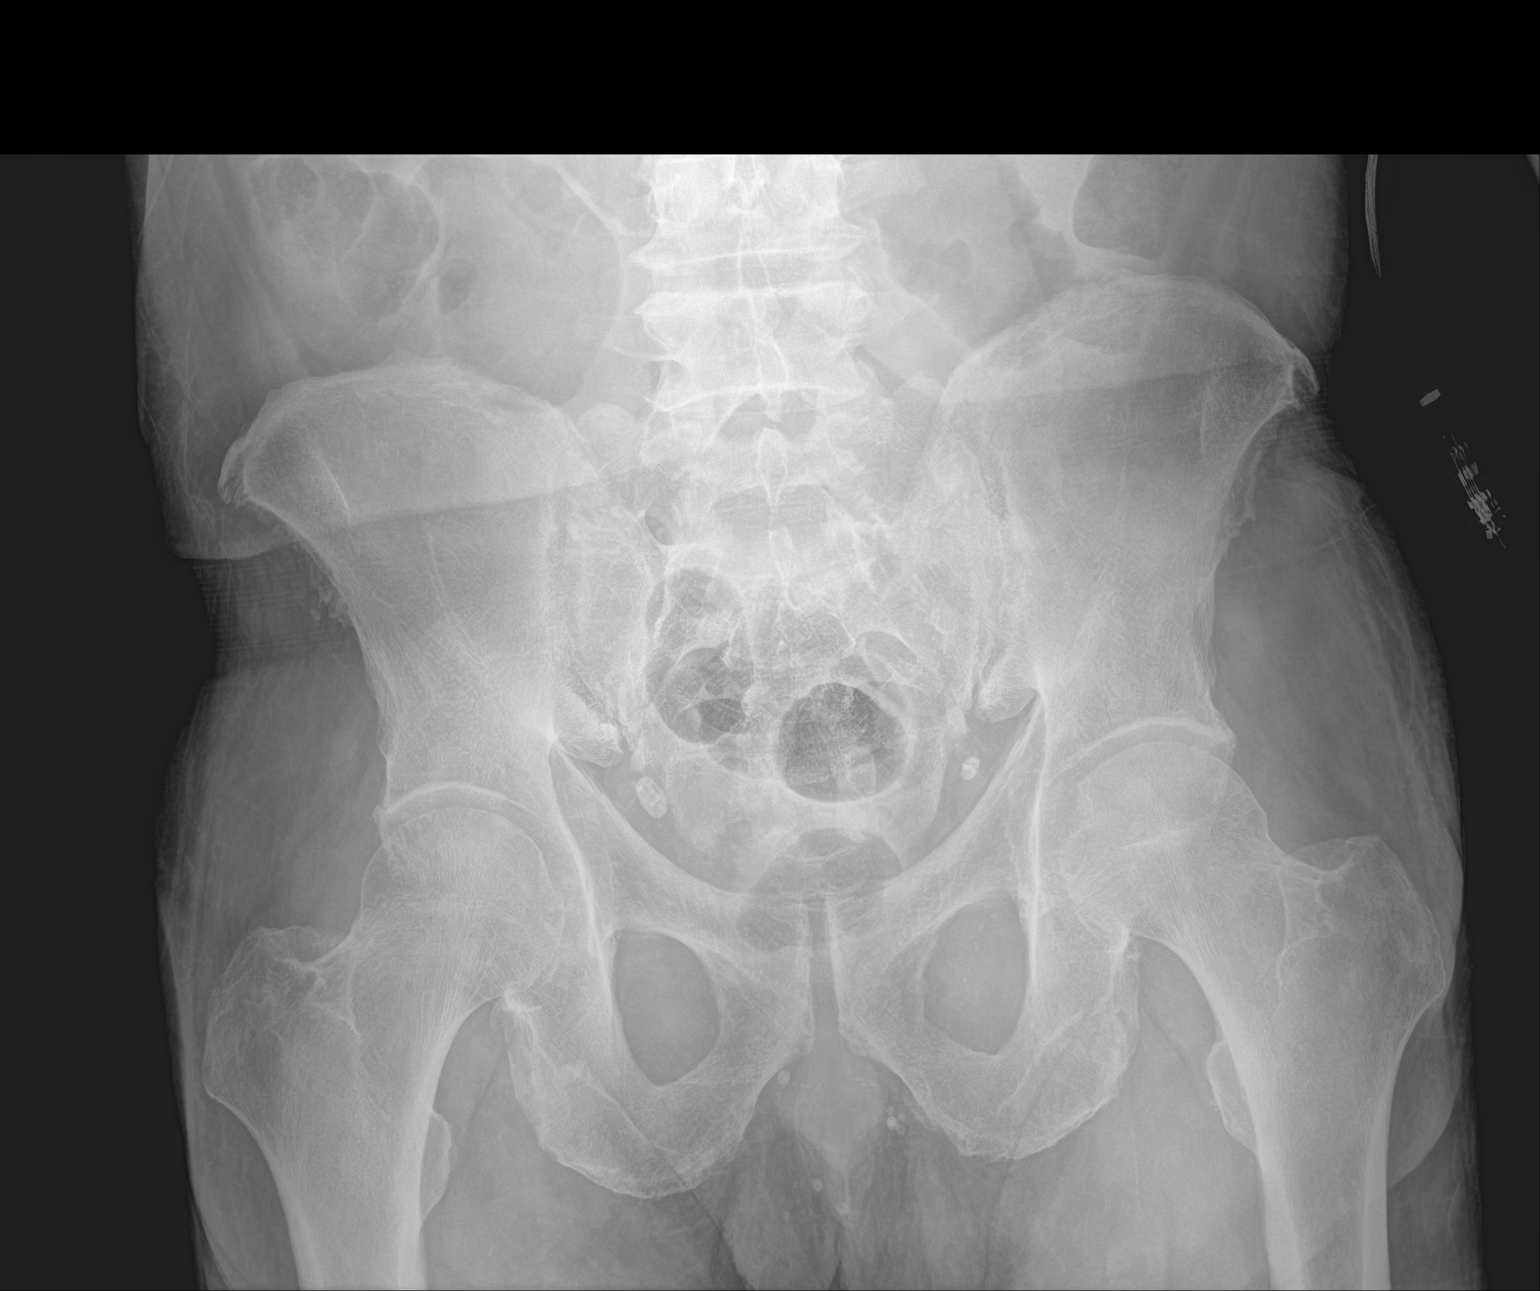

[1 of 1 positions shown; findings below may reference images not displayed]

FINDINGS: There is no acute fracture or dislocation. Mild bilateral hip
arthritic changes. There is mild degenerative changes of the lumbar
spine and SI joints. The soft tissues are unremarkable.
IMPRESSION: No acute findings.

## 2021-04-06 MED ORDER — LIDOCAINE-EPINEPHRINE 1 %-1:100000 IJ SOLN
20.0000 mL | Freq: Once | INTRAMUSCULAR | Status: AC
  Administered 2021-04-06: 20 mL
  Filled 2021-04-06: qty 1

## 2021-04-06 NOTE — ED Notes (Signed)
Only basic labs at this time per MD, no full trauma panel.

## 2021-04-06 NOTE — ED Triage Notes (Signed)
Pt brought in by EMS for a fall out of his wheel chair at [facility]. Pt is on Plavix.

## 2021-04-06 NOTE — Discharge Instructions (Addendum)
Please follow-up with your primary doctor in 1 week for suture removal and wound recheck.  Recommend changing dressing daily.  Come back to ER if you have any additional falls, or other new concerning symptom.

## 2021-04-06 NOTE — H&P (Signed)
Transition of Care [facility]) - CAGE-AID Screening   Patient Details  Name: Antonio Navarro MRN: [id number] Date of Birth: 02/03/1961  Transition of Care [facility]):    [name], RN Phone Number: 04/06/2021, 11:35 PM   CAGE-AID Screening: Substance Abuse Screening unable to be completed due to: : Patient unable to participate (Patient confused)

## 2021-04-06 NOTE — Progress Notes (Signed)
Orthopedic Tech Progress Note Patient Details:  Antonio Navarro [birth year] [id number] Level 2 trauma Patient ID: Antonio Navarro, male   DOB: [birth year], 60 y.o.   MRN: [id number]  Antonio Navarro 04/06/2021, 11:27 PM

## 2021-04-06 NOTE — ED Provider Notes (Signed)
[facility] EMERGENCY DEPARTMENT Provider Note   CSN: [id number] Arrival date & time: 04/06/21  2245     History Chief Complaint  Patient presents with   Antonio Navarro    DEBRON FRITCH is a 60 y.o. male.  Presents to ER with concern for fall.  Level 2 trauma due to fall on thinners.  Patient reportedly takes Plavix.  Patient was getting out of his wheelchair and fell to the ground and hit his head.  Witnessed.  No LOC.  Patient denies any acute complaints at present. History limited due to acuity.  HPI     No past medical history on file.  There are no problems to display for this patient.   No family history on file.     Home Medications Prior to Admission medications   Not on File    Allergies    Patient has no known allergies.  Review of Systems   Review of Systems  Unable to perform ROS: Acuity of condition   Physical Exam Updated Vital Signs BP (!) 129/91 (BP Location: Left Arm)   Pulse 77   Temp 97.8 F (36.6 C) (Oral)   Resp 18   Ht 6' (1.829 m)   Wt 70.3 kg   SpO2 99%   BMI 21.02 kg/m   Physical Exam Vitals and nursing note reviewed.  Constitutional:      Appearance: He is well-developed.  HENT:     Head: Normocephalic.     Comments: 3 cm laceration to the right forehead, small venous ooze noted Eyes:     Conjunctiva/sclera: Conjunctivae normal.  Cardiovascular:     Rate and Rhythm: Normal rate and regular rhythm.     Heart sounds: No murmur heard. Pulmonary:     Effort: Pulmonary effort is normal. No respiratory distress.     Breath sounds: Normal breath sounds.  Abdominal:     Palpations: Abdomen is soft.     Tenderness: There is no abdominal tenderness.  Musculoskeletal:     Cervical back: Neck supple.     Comments: Back: no C, T, L spine TTP, no step off or deformity RUE: no TTP throughout, no deformity, normal joint ROM, radial pulse intact, distal sensation and motor intact LUE: no TTP throughout, no deformity,  normal joint ROM, radial pulse intact, distal sensation and motor intact RLE:  no TTP throughout, no deformity, normal joint ROM, distal pulse, sensation and motor intact LLE: no TTP throughout, no deformity, normal joint ROM, distal pulse, sensation and motor intact  Skin:    General: Skin is warm and dry.  Neurological:     Mental Status: He is alert.     Comments: Alert  Psychiatric:        Mood and Affect: Mood normal.    ED Results / Procedures / Treatments   Labs (all labs ordered are listed, but only abnormal results are displayed) Labs Reviewed  CBC WITH DIFFERENTIAL/PLATELET - Abnormal; Notable for the following components:      Result Value   RBC 4.14 (*)    All other components within normal limits  BASIC METABOLIC PANEL - Abnormal; Notable for the following components:   Creatinine, Ser 1.27 (*)    All other components within normal limits  PROTIME-INR    EKG None  Radiology CT HEAD WO CONTRAST (5MM)  Result Date: 04/06/2021 CLINICAL DATA:  Trauma. EXAM: CT HEAD WITHOUT CONTRAST CT CERVICAL SPINE WITHOUT CONTRAST TECHNIQUE: Multidetector CT imaging of the head and cervical  spine was performed following the standard protocol without intravenous contrast. Multiplanar CT image reconstructions of the cervical spine were also generated. COMPARISON:  None. FINDINGS: CT HEAD FINDINGS Brain: Cerebral ventricle sizes are concordant with the degree of cerebral volume loss. No evidence of large-territorial acute infarction. No parenchymal hemorrhage. No mass lesion. No extra-axial collection. No mass effect or midline shift. No hydrocephalus. Basilar cisterns are patent. Vascular: No hyperdense vessel. Atherosclerotic calcifications are present within the cavernous internal carotid arteries. Skull: No acute fracture or focal lesion. Sinuses/Orbits: Paranasal sinuses and mastoid air cells are clear. The orbits are unremarkable. Other: Mild diffuse soft tissue edema with foci of gas  along the right frontal scalp. CT CERVICAL SPINE FINDINGS Alignment: Straightening of the normal cervical lordosis likely due to positioning and degenerative changes. Skull base and vertebrae: Multilevel mild to moderate degenerative changes. No acute fracture. No aggressive appearing focal osseous lesion or focal pathologic process. Soft tissues and spinal canal: No prevertebral fluid or swelling. No visible canal hematoma. Upper chest: Unremarkable. Other: None. IMPRESSION: 1. No acute intracranial abnormality. 2. No acute displaced fracture or traumatic listhesis of the cervical spine. Electronically Signed   By: Iven Finn M.D.   On: 04/06/2021 23:09   CT Cervical Spine Wo Contrast  Result Date: 04/06/2021 CLINICAL DATA:  Trauma. EXAM: CT HEAD WITHOUT CONTRAST CT CERVICAL SPINE WITHOUT CONTRAST TECHNIQUE: Multidetector CT imaging of the head and cervical spine was performed following the standard protocol without intravenous contrast. Multiplanar CT image reconstructions of the cervical spine were also generated. COMPARISON:  None. FINDINGS: CT HEAD FINDINGS Brain: Cerebral ventricle sizes are concordant with the degree of cerebral volume loss. No evidence of large-territorial acute infarction. No parenchymal hemorrhage. No mass lesion. No extra-axial collection. No mass effect or midline shift. No hydrocephalus. Basilar cisterns are patent. Vascular: No hyperdense vessel. Atherosclerotic calcifications are present within the cavernous internal carotid arteries. Skull: No acute fracture or focal lesion. Sinuses/Orbits: Paranasal sinuses and mastoid air cells are clear. The orbits are unremarkable. Other: Mild diffuse soft tissue edema with foci of gas along the right frontal scalp. CT CERVICAL SPINE FINDINGS Alignment: Straightening of the normal cervical lordosis likely due to positioning and degenerative changes. Skull base and vertebrae: Multilevel mild to moderate degenerative changes. No acute  fracture. No aggressive appearing focal osseous lesion or focal pathologic process. Soft tissues and spinal canal: No prevertebral fluid or swelling. No visible canal hematoma. Upper chest: Unremarkable. Other: None. IMPRESSION: 1. No acute intracranial abnormality. 2. No acute displaced fracture or traumatic listhesis of the cervical spine. Electronically Signed   By: Iven Finn M.D.   On: 04/06/2021 23:09   DG Pelvis Portable  Result Date: 04/06/2021 CLINICAL DATA:  Fall. EXAM: PORTABLE PELVIS 1-2 VIEWS COMPARISON:  None. FINDINGS: There is no acute fracture or dislocation. Mild bilateral hip arthritic changes. There is mild degenerative changes of the lumbar spine and SI joints. The soft tissues are unremarkable. IMPRESSION: No acute findings. Electronically Signed   By: Anner Crete M.D.   On: 04/06/2021 23:09   DG Chest Portable 1 View  Result Date: 04/06/2021 CLINICAL DATA:  Fall. EXAM: PORTABLE CHEST 1 VIEW COMPARISON:  Chest radiograph dated 02/24/2021. FINDINGS: The lungs are clear. There is no pleural effusion pneumothorax. The cardiac silhouette is within limits. No acute osseous pathology. IMPRESSION: No active disease. Electronically Signed   By: Anner Crete M.D.   On: 04/06/2021 23:07    Procedures .Marland KitchenLaceration Repair  Date/Time: 04/07/2021 5:36 PM Performed  by: Lucrezia Starch, MD Authorized by: Lucrezia Starch, MD   Consent:    Consent obtained:  Verbal   Risks discussed:  Infection, need for additional repair, nerve damage, poor wound healing, poor cosmetic result, retained foreign body, tendon damage, vascular damage and pain   Alternatives discussed:  No treatment Universal protocol:    Immediately prior to procedure, a time out was called: yes     Patient identity confirmed:  Verbally with patient Anesthesia:    Anesthesia method:  Local infiltration   Local anesthetic:  Lidocaine 1% WITH epi Laceration details:    Location:  Face   Face location:   Forehead   Length (cm):  3 Pre-procedure details:    Preparation:  Patient was prepped and draped in usual sterile fashion Treatment:    Area cleansed with:  Saline and povidone-iodine   Amount of cleaning:  Extensive   Irrigation method:  Syringe   Debridement:  None   Undermining:  None Skin repair:    Repair method:  Sutures   Suture size:  5-0   Suture material:  Nylon   Suture technique:  Simple interrupted   Number of sutures:  2 Approximation:    Approximation:  Close Repair type:    Repair type:  Simple Post-procedure details:    Dressing: gauze.   Procedure completion:  Tolerated well, no immediate complications   Medications Ordered in ED Medications  lidocaine-EPINEPHrine (XYLOCAINE W/EPI) 1 %-1:100000 (with pres) injection 20 mL (20 mLs Infiltration Given by Other 04/06/21 2324)    ED Course  I have reviewed the triage vital signs and the nursing notes.  Pertinent labs & imaging results that were available during my care of the patient were reviewed by me and considered in my medical decision making (see chart for details).    MDM Rules/Calculators/A&P                           60 year old male presented to ER with concern for level 2 trauma, fall on thinners with head trauma.  Had laceration to forehead.  Performed wound closure.  Good approximation.  No further bleeding.  CT imaging negative.  Basic labs stable.  Discharged back to facility.  Will need wound check and suture removal in 1 week.    After the discussed management above, the patient was determined to be safe for discharge.  The patient was in agreement with this plan and all questions regarding their care were answered.  ED return precautions were discussed and the patient will return to the ED with any significant worsening of condition.  Final Clinical Impression(s) / ED Diagnoses Final diagnoses:  Fall, initial encounter  Facial laceration, initial encounter    Rx / DC Orders ED  Discharge Orders     None        Lucrezia Starch, MD 04/07/21 1737

## 2021-04-07 NOTE — ED Notes (Signed)
PTAR CALLED  °

## 2021-06-04 ENCOUNTER — Encounter: Payer: Self-pay | Admitting: Cardiology

## 2021-06-04 ENCOUNTER — Ambulatory Visit (INDEPENDENT_AMBULATORY_CARE_PROVIDER_SITE_OTHER): Payer: Medicaid Other | Admitting: Cardiology

## 2021-06-04 ENCOUNTER — Other Ambulatory Visit: Payer: Self-pay

## 2021-06-04 VITALS — BP 100/60 | HR 71 | Ht 72.0 in | Wt 157.8 lb

## 2021-06-04 DIAGNOSIS — I2583 Coronary atherosclerosis due to lipid rich plaque: Secondary | ICD-10-CM

## 2021-06-04 DIAGNOSIS — I2510 Atherosclerotic heart disease of native coronary artery without angina pectoris: Secondary | ICD-10-CM

## 2021-06-04 DIAGNOSIS — E785 Hyperlipidemia, unspecified: Secondary | ICD-10-CM | POA: Insufficient documentation

## 2021-06-04 DIAGNOSIS — I255 Ischemic cardiomyopathy: Secondary | ICD-10-CM | POA: Diagnosis not present

## 2021-06-04 LAB — COMPREHENSIVE METABOLIC PANEL
ALT: 22 IU/L (ref 0–44)
AST: 17 IU/L (ref 0–40)
Albumin/Globulin Ratio: 1.6 (ref 1.2–2.2)
Albumin: 4.2 g/dL (ref 3.8–4.9)
Alkaline Phosphatase: 78 IU/L (ref 44–121)
BUN/Creatinine Ratio: 12 (ref 9–20)
BUN: 14 mg/dL (ref 6–24)
Bilirubin Total: 1.2 mg/dL (ref 0.0–1.2)
CO2: 27 mmol/L (ref 20–29)
Calcium: 9.6 mg/dL (ref 8.7–10.2)
Chloride: 107 mmol/L — ABNORMAL HIGH (ref 96–106)
Creatinine, Ser: 1.2 mg/dL (ref 0.76–1.27)
Globulin, Total: 2.6 g/dL (ref 1.5–4.5)
Glucose: 88 mg/dL (ref 70–99)
Potassium: 3.9 mmol/L (ref 3.5–5.2)
Sodium: 144 mmol/L (ref 134–144)
Total Protein: 6.8 g/dL (ref 6.0–8.5)
eGFR: 70 mL/min/{1.73_m2} (ref >59)

## 2021-06-04 LAB — LIPID PANEL
Chol/HDL Ratio: 1.9 ratio (ref 0.0–5.0)
Cholesterol, Total: 92 mg/dL — ABNORMAL LOW (ref 100–199)
HDL: 48 mg/dL (ref >39)
LDL Chol Calc (NIH): 32 mg/dL (ref 0–99)
Triglycerides: 45 mg/dL (ref 0–149)
VLDL Cholesterol Cal: 12 mg/dL (ref 5–40)

## 2021-06-04 MED ORDER — ATORVASTATIN CALCIUM 80 MG PO TABS: 80.0000 mg | ORAL_TABLET | Freq: Every day | ORAL | 3 refills | Status: DC

## 2021-06-04 MED ORDER — METOPROLOL SUCCINATE ER 25 MG PO TB24: 12.5000 mg | ORAL_TABLET | Freq: Every day | ORAL | 3 refills | Status: DC

## 2021-06-04 MED ORDER — LOSARTAN POTASSIUM 25 MG PO TABS: 25.0000 mg | ORAL_TABLET | Freq: Every day | ORAL | 3 refills | Status: DC

## 2021-06-04 MED ORDER — CLOPIDOGREL BISULFATE 75 MG PO TABS: 75.0000 mg | ORAL_TABLET | Freq: Every day | ORAL | 3 refills | Status: DC

## 2021-06-04 MED ORDER — ISOSORBIDE MONONITRATE ER 30 MG PO TB24: 30.0000 mg | ORAL_TABLET | Freq: Every day | ORAL | 3 refills | Status: DC

## 2021-06-04 NOTE — Patient Instructions (Signed)
Medication Instructions:  Your physician recommends that you continue on your current medications as directed. Please refer to the Current Medication list given to you today.  *If you need a refill on your cardiac medications before your next appointment, please call your pharmacy*   Lab Work: TODAY: FLP and CMET If you have labs (blood work) drawn today and your tests are completely normal, you will receive your results only by: [place] (if you have MyChart) OR A paper copy in the mail If you have any lab test that is abnormal or we need to change your treatment, we will call you to review the results.   Testing/Procedures: Your physician has requested that you have an echocardiogram. Echocardiography is a painless test that uses sound waves to create images of your heart. It provides your doctor with information about the size and shape of your heart and how well your heart's chambers and valves are working. This procedure takes approximately one hour. There are no restrictions for this procedure.  Follow-Up: At [facility], you and your health needs are our priority.  As part of our continuing mission to provide you with exceptional heart care, we have created designated Provider Care Teams.  These Care Teams include your primary Cardiologist (physician) and Advanced Practice Providers (APPs -  Physician Assistants and Nurse Practitioners) who all work together to provide you with the care you need, when you need it.  Your next appointment:   6 month(s)  The format for your next appointment:   In Person  Provider:   You may see Fransico Him, MD or one of the following Advanced Practice Providers on your designated Care Team:   Melina Copa, PA-C Ermalinda Barrios, PA-C

## 2021-06-04 NOTE — Progress Notes (Signed)
Cardiology Office Note:    Date:  06/04/2021   ID:  Antonio Navarro, DOB Mar 31, 1961, MRN [id number]  PCP:  Shelbie Ammons, MD  Cardiologist:  Fransico Him, MD    Referring MD: No ref. provider found   Chief Complaint  Patient presents with   Coronary Artery Disease   Cardiomyopathy   Hyperlipidemia     History of Present Illness:    Antonio Navarro is a 60 y.o. male with a hx of ASCAD s/p STEMI with cardiac arrest, ischemic DCM, HTN, HLD who is here today for followup.  He was dx with CAD in Dec 2021 when he presented with cardiac arrest with down time 9 minutes and dx with STEMI and underwent emergent cath which showed 95% mid LCx and 40% pRCA and underwent  DES to the LCx.  He suffered anoxic brain injury with prolonged hospitalization.  Initial EF was 40% on echo.     is here today for followup and is doing well.  He denies any chest pain or pressure, SOB, DOE, PND, orthopnea, LE edema, dizziness, palpitations or syncope. He is compliant with his meds and is tolerating meds with no SE.     Past Medical History:  Diagnosis Date   CAD (coronary artery disease), native coronary artery 07/2020   s/p cardiac arrest in setting of STEMI with cath showing 40%pRCA and 95% mLCx s/p PCI   HLD (hyperlipidemia)    Hypertension    Ischemic cardiomyopathy 07/2020   EF 40% at time of MI   Kidney stones     Past Surgical History:  Procedure Laterality Date   CORONARY/GRAFT ACUTE MI REVASCULARIZATION N/A 07/22/2020   Procedure: Coronary/Graft Acute MI Revascularization;  Surgeon: Lorretta Harp, MD;  Location: [place] [facility];  Service: Cardiovascular;  Laterality: N/A;   IR GASTROSTOMY TUBE MOD SED  08/17/2020   IR GASTROSTOMY TUBE REMOVAL  11/08/2020   LEFT HEART CATH AND CORONARY ANGIOGRAPHY N/A 07/22/2020   Procedure: LEFT HEART CATH AND CORONARY ANGIOGRAPHY;  Surgeon: Lorretta Harp, MD;  Location: [place] [facility];  Service: Cardiovascular;  Laterality: N/A;     Current Medications: Current Meds  Medication Sig   acetaminophen (TYLENOL) 325 MG tablet Take 2 tablets (650 mg total) by mouth every 6 (six) hours as needed for mild pain, fever or headache.   aspirin 81 MG chewable tablet Chew 1 tablet (81 mg total) by mouth daily.   atorvastatin (LIPITOR) 80 MG tablet Take 1 tablet (80 mg total) by mouth daily.   citalopram (CELEXA) 40 MG tablet Take 1 tablet (40 mg total) by mouth daily.   clopidogrel (PLAVIX) 75 MG tablet Take 1 tablet (75 mg total) by mouth daily.   docusate sodium (COLACE) 100 MG capsule Take 1 capsule (100 mg total) by mouth 2 (two) times daily as needed for mild constipation.   feeding supplement (ENSURE ENLIVE / ENSURE PLUS) LIQD Take 237 mLs by mouth 2 (two) times daily between meals.   isosorbide mononitrate (IMDUR) 30 MG 24 hr tablet Take 1 tablet (30 mg total) by mouth daily.   losartan (COZAAR) 25 MG tablet Take 25 mg by mouth daily.   metoprolol succinate (TOPROL-XL) 25 MG 24 hr tablet Take 0.5 tablets (12.5 mg total) by mouth daily.   Multiple Vitamin (MULTIVITAMIN WITH MINERALS) TABS tablet Take 1 tablet by mouth daily.   Nutritional Supplements (RESOURCE 2.0 PO) Take 60 mLs by mouth in the morning and at bedtime.  ondansetron (ZOFRAN ODT) 4 MG disintegrating tablet 4mg  ODT q4 hours prn nausea/vomit   polyethylene glycol (MIRALAX / GLYCOLAX) 17 g packet Take 17 g by mouth daily as needed for moderate constipation.   QUEtiapine (SEROQUEL) 25 MG tablet Take 1 tablet (25 mg total) by mouth at bedtime.     Allergies:   Patient has no known allergies.   Social History   Socioeconomic History   Marital status: Single    Spouse name: Not on file   Number of children: Not on file   Years of education: Not on file   Highest education level: Not on file  Occupational History   Not on file  Tobacco Use   Smoking status: Every Day    Packs/day: 1.00    Types: Cigarettes   Smokeless tobacco: Never  Vaping Use    Vaping Use: Never used  Substance and Sexual Activity   Alcohol use: Yes    Comment: occasional   Drug use: Yes    Types: Marijuana    Comment: occ   Sexual activity: Not on file  Other Topics Concern   Not on file  Social History Narrative   Not on file   Social Determinants of Health   Financial Resource Strain: Not on file  Food Insecurity: Not on file  Transportation Needs: Not on file  Physical Activity: Not on file  Stress: Not on file  Social Connections: Not on file     Family History: The patient's family history includes Cancer in his mother; Hypertension in his father.  ROS:   Please see the history of present illness.    ROS  All other systems reviewed and negative.   EKGs/Labs/Other Studies Reviewed:    The following studies were reviewed today:   EKG:  EKG is not ordered today.   Recent Labs: 09/30/2020: Magnesium 1.7 02/24/2021: ALT 24 04/06/2021: BUN 13; Creatinine, Ser 1.27; Hemoglobin 13.2; Platelets 156; Potassium 3.7; Sodium 135   Recent Lipid Panel    Component Value Date/Time   TRIG 138 07/26/2020 0526     Physical Exam:    VS:  BP 100/60   Pulse 71   Ht 6' (1.829 m)   Wt 157 lb 12.8 oz (71.6 kg)   SpO2 94%   BMI 21.40 kg/m     Wt Readings from Last 3 Encounters:  06/04/21 157 lb 12.8 oz (71.6 kg)  04/06/21 155 lb (70.3 kg)  02/24/21 180 lb (81.6 kg)     GEN:  Well nourished, well developed in no acute distress HEENT: Normal NECK: No JVD; No carotid bruits LYMPHATICS: No lymphadenopathy CARDIAC: RRR, no murmurs, rubs, gallops RESPIRATORY:  Clear to auscultation without rales, wheezing or rhonchi  ABDOMEN: Soft, non-tender, non-distended MUSCULOSKELETAL:  No edema; No deformity  SKIN: Warm and dry NEUROLOGIC:  Alert and oriented x 3 PSYCHIATRIC:  Normal affect   ASSESSMENT:    1. Coronary artery disease due to lipid rich plaque   2. Cardiomyopathy, ischemic   3. Hyperlipidemia LDL goal <70    PLAN:    In order of  problems listed above:   ASCAD -s/p STEMI complicated by cardiac arrest -cath with 40% pRCA and 95% mLCX s/p PCI -he denies any anginal sx -continue ASA, Plavix 75mg  daily, BB, Imdur and statin  2.  Ischemic DCM -EF by echo was 40% at time of MI and Cath -continue prescription drug management with Toprol XL 25mg  daily, Losartan 25mg  daily, Imdur 30mg  daily -repeat 2D echo and  if EF remains low then change Losartan to Entresto  3.  HLD -LDL goal < 70 -check FLP and CMET -continue prescription drug management with Atorvastatin 80mg  daily>refilled  4.  HTN -BP controlled on exam today -continue Toprol and Losartan   Time Spent: 25 minutes total time of encounter, including 15 minutes spent in face-to-face patient care on the date of this encounter. This time includes coordination of care and counseling regarding above mentioned problem list. Remainder of non-face-to-face time involved reviewing chart documents/testing relevant to the patient encounter and documentation in the medical record. I have independently reviewed documentation from referring provider  Medication Adjustments/Labs and Tests Ordered: Current medicines are reviewed at length with the patient today.  Concerns regarding medicines are outlined above.  No orders of the defined types were placed in this encounter.  No orders of the defined types were placed in this encounter.   Signed, Fransico Him, MD  06/04/2021 8:31 AM    [place]

## 2021-06-11 ENCOUNTER — Ambulatory Visit: Payer: Medicaid Other | Admitting: Neurology

## 2021-06-11 ENCOUNTER — Encounter: Payer: Self-pay | Admitting: Neurology

## 2021-06-11 ENCOUNTER — Telehealth: Payer: Self-pay | Admitting: Cardiology

## 2021-06-11 NOTE — Telephone Encounter (Signed)
Spoke with the patient's sister and provided her with the number for [employer] as well as some phone numbers for dentists in the area.

## 2021-06-11 NOTE — Telephone Encounter (Signed)
Antonio Navarro , sister of the patient called. She was wondering if Dr. Radford Pax or her Nurse could email her a list of PCP's the patient could see. The patient is in a [place] recovering from a brain injury and she is handeling all of his affairs.  Please e-mail them to    [email or web address]

## 2021-06-20 ENCOUNTER — Ambulatory Visit (HOSPITAL_COMMUNITY): Payer: Medicaid Other

## 2021-07-03 ENCOUNTER — Ambulatory Visit (HOSPITAL_COMMUNITY): Payer: Medicaid Other

## 2021-07-18 ENCOUNTER — Other Ambulatory Visit (HOSPITAL_COMMUNITY): Payer: Medicaid Other

## 2021-07-22 DIAGNOSIS — Z955 Presence of coronary angioplasty implant and graft: Secondary | ICD-10-CM

## 2021-07-22 HISTORY — DX: Presence of coronary angioplasty implant and graft: Z95.5

## 2021-08-03 ENCOUNTER — Ambulatory Visit (HOSPITAL_COMMUNITY): Payer: Medicaid Other | Attending: Cardiology

## 2021-08-03 ENCOUNTER — Other Ambulatory Visit: Payer: Self-pay

## 2021-08-03 DIAGNOSIS — I2583 Coronary atherosclerosis due to lipid rich plaque: Secondary | ICD-10-CM | POA: Diagnosis present

## 2021-08-03 DIAGNOSIS — I2510 Atherosclerotic heart disease of native coronary artery without angina pectoris: Secondary | ICD-10-CM | POA: Diagnosis not present

## 2021-08-03 DIAGNOSIS — E785 Hyperlipidemia, unspecified: Secondary | ICD-10-CM | POA: Diagnosis not present

## 2021-08-03 DIAGNOSIS — I255 Ischemic cardiomyopathy: Secondary | ICD-10-CM | POA: Diagnosis present

## 2021-08-03 LAB — ECHOCARDIOGRAM COMPLETE
Area-P 1/2: 2.32 cm2
S' Lateral: 3.4 cm

## 2021-08-09 ENCOUNTER — Telehealth: Payer: Self-pay | Admitting: Cardiology

## 2021-08-09 NOTE — Telephone Encounter (Signed)
Antonio Bergeron, MD  08/03/2021  8:13 PM EST     His echo looks great with normal pumping function and no significant valve disease. His heart relaxes a little abnormally so we just need to make sure his blood pressure is well controlled going forward  The patient's spouse has been notified of the result and verbalized understanding.  All questions (if any) were answered. Antonieta Iba, RN 08/09/2021 1:23 PM

## 2021-08-09 NOTE — Telephone Encounter (Signed)
Patient's spouse is returning call to discuss echo results.

## 2021-08-27 ENCOUNTER — Other Ambulatory Visit: Payer: Self-pay | Admitting: Urology

## 2021-08-27 DIAGNOSIS — R972 Elevated prostate specific antigen [PSA]: Secondary | ICD-10-CM

## 2021-09-17 ENCOUNTER — Other Ambulatory Visit: Payer: Medicaid Other

## 2021-09-25 ENCOUNTER — Other Ambulatory Visit: Payer: Self-pay

## 2021-09-25 ENCOUNTER — Ambulatory Visit
Admission: RE | Admit: 2021-09-25 | Discharge: 2021-09-25 | Disposition: A | Discharge status: Home / Self Care | Payer: Medicaid Other | Source: Ambulatory Visit | Attending: Urology | Admitting: Urology

## 2021-09-25 DIAGNOSIS — R972 Elevated prostate specific antigen [PSA]: Secondary | ICD-10-CM

## 2021-09-26 ENCOUNTER — Other Ambulatory Visit: Payer: Self-pay | Admitting: Urology

## 2021-09-26 ENCOUNTER — Other Ambulatory Visit (HOSPITAL_COMMUNITY): Payer: Self-pay | Admitting: Urology

## 2021-09-26 DIAGNOSIS — R972 Elevated prostate specific antigen [PSA]: Secondary | ICD-10-CM

## 2021-10-12 ENCOUNTER — Encounter (HOSPITAL_COMMUNITY): Payer: Self-pay | Admitting: *Deleted

## 2021-10-15 ENCOUNTER — Encounter (HOSPITAL_COMMUNITY): Payer: Self-pay | Admitting: *Deleted

## 2021-10-15 ENCOUNTER — Other Ambulatory Visit: Payer: Self-pay

## 2021-10-15 NOTE — Progress Notes (Signed)
Spoke with pt's sister, Adline Mango for pre-op call. Pt is unable to do own call because of an Anoxic brain injury. Pt had a cardiac arrest in 2021 causing the brain injury. She states pt is not diabetic. She states he can answer some questions, but will need either her or his significant other to be with him in pre-op.  Pt's surgery is scheduled as ambulatory so no Covid test is required prior to surgery.

## 2021-10-16 ENCOUNTER — Encounter (HOSPITAL_COMMUNITY): Admission: AD | Disposition: A | Discharge status: Home / Self Care | Payer: Self-pay | Source: Ambulatory Visit

## 2021-10-16 ENCOUNTER — Ambulatory Visit (HOSPITAL_COMMUNITY)
Admission: RE | Admit: 2021-10-16 | Discharge: 2021-10-16 | Disposition: A | Discharge status: Home / Self Care | Payer: Medicaid Other | Source: Ambulatory Visit | Attending: Urology | Admitting: Urology

## 2021-10-16 ENCOUNTER — Other Ambulatory Visit: Payer: Self-pay

## 2021-10-16 ENCOUNTER — Ambulatory Visit (HOSPITAL_COMMUNITY)
Admission: AD | Admit: 2021-10-16 | Discharge: 2021-10-16 | Disposition: A | Discharge status: Home / Self Care | Payer: Medicaid Other | Source: Ambulatory Visit | Attending: Urology | Admitting: Urology

## 2021-10-16 ENCOUNTER — Ambulatory Visit (HOSPITAL_BASED_OUTPATIENT_CLINIC_OR_DEPARTMENT_OTHER): Payer: Medicaid Other | Admitting: Certified Registered"

## 2021-10-16 ENCOUNTER — Encounter (HOSPITAL_COMMUNITY): Payer: Self-pay

## 2021-10-16 ENCOUNTER — Ambulatory Visit (HOSPITAL_COMMUNITY): Payer: Medicaid Other | Admitting: Certified Registered"

## 2021-10-16 DIAGNOSIS — I2510 Atherosclerotic heart disease of native coronary artery without angina pectoris: Secondary | ICD-10-CM | POA: Insufficient documentation

## 2021-10-16 DIAGNOSIS — I10 Essential (primary) hypertension: Secondary | ICD-10-CM | POA: Insufficient documentation

## 2021-10-16 DIAGNOSIS — I252 Old myocardial infarction: Secondary | ICD-10-CM | POA: Insufficient documentation

## 2021-10-16 DIAGNOSIS — Z87891 Personal history of nicotine dependence: Secondary | ICD-10-CM | POA: Diagnosis not present

## 2021-10-16 DIAGNOSIS — R972 Elevated prostate specific antigen [PSA]: Secondary | ICD-10-CM | POA: Insufficient documentation

## 2021-10-16 DIAGNOSIS — Z955 Presence of coronary angioplasty implant and graft: Secondary | ICD-10-CM | POA: Diagnosis not present

## 2021-10-16 HISTORY — DX: Family history of other specified conditions: Z84.89

## 2021-10-16 HISTORY — DX: Personal history of urinary calculi: Z87.442

## 2021-10-16 HISTORY — PX: RADIOLOGY WITH ANESTHESIA: SHX6223

## 2021-10-16 LAB — BASIC METABOLIC PANEL
Anion gap: 8 (ref 5–15)
BUN: 16 mg/dL (ref 6–20)
CO2: 27 mmol/L (ref 22–32)
Calcium: 9.2 mg/dL (ref 8.9–10.3)
Chloride: 107 mmol/L (ref 98–111)
Creatinine, Ser: 1.23 mg/dL (ref 0.61–1.24)
GFR, Estimated: >60 mL/min (ref >60)
Glucose, Bld: 87 mg/dL (ref 70–99)
Potassium: 3.9 mmol/L (ref 3.5–5.1)
Sodium: 142 mmol/L (ref 135–145)

## 2021-10-16 IMAGING — MR MR PROSTATE WO/W CM
40 of 56 series · 40 of 56 positions shown · IV contrast (y GAD)
Comparison: CT pelvis [DATE]

CLINICAL DATA: Elevated PSA level.

EXAM:
MR PROSTATE WITHOUT AND WITH CONTRAST
TECHNIQUE: Multiplanar multisequence MRI images were obtained of the pelvis
centered about the prostate. Pre and post contrast images were
obtained.
CONTRAST:  7.5mL GADAVIST GADOBUTROL 1 MMOL/ML IV SOLN

[Series 3: T1 dynamic · axial · 5.0mm · 0.70mm/px · 1 of 112 slices shown (1 of 28)]
[im 1/112]
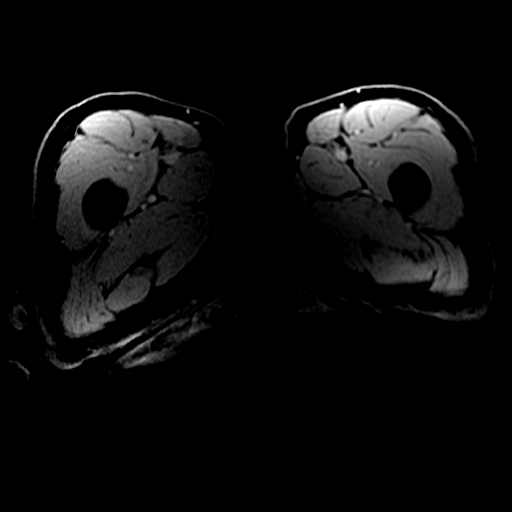

[Series 5: T2 · axial · 3.0mm · 0.31mm/px · 1 of 29 slices shown (1 of 2)]
[im 1/29]
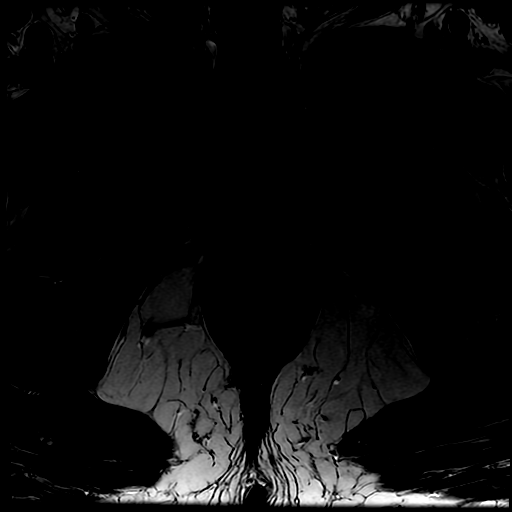

[Series 6: T2 · coronal · 3.0mm · 0.31mm/px · 1 of 26 slices shown (2 of 2)]
[im 1/26]
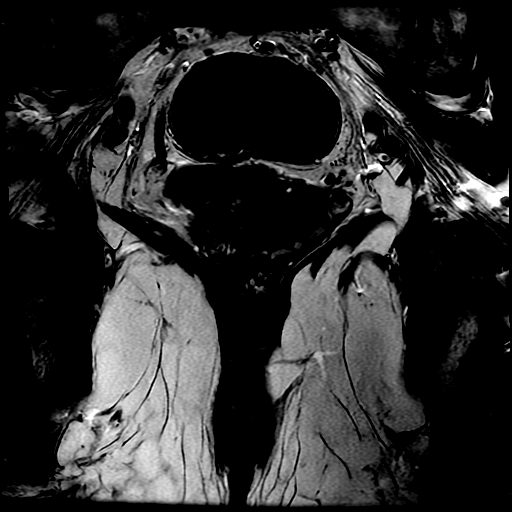

[Series 8: DWI · axial · 3.0mm · 0.78mm/px · 1 of 78 slices shown (1 of 2)]
[im 1/78]
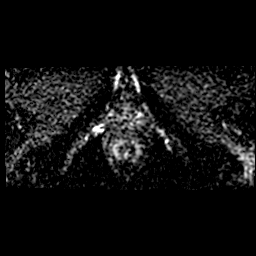

[Series 10: T1 dynamic post-contrast · axial · 4.0mm · 0.86mm/px · 1 of 116 slices shown]
[im 1/116]
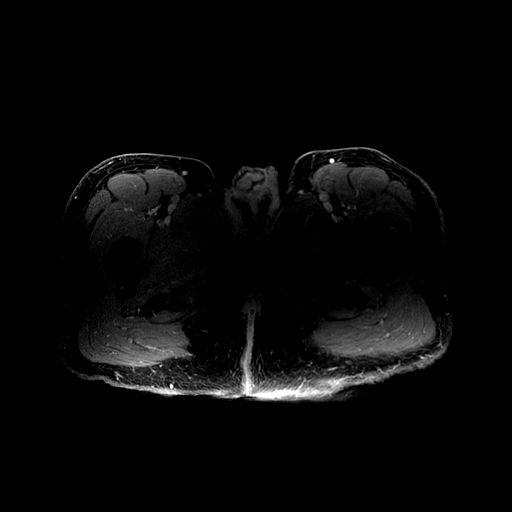

[Series 301: T1 dynamic · axial · 5.0mm · 0.70mm/px · 1 of 112 slices shown (2 of 28)]
[im 1/112]
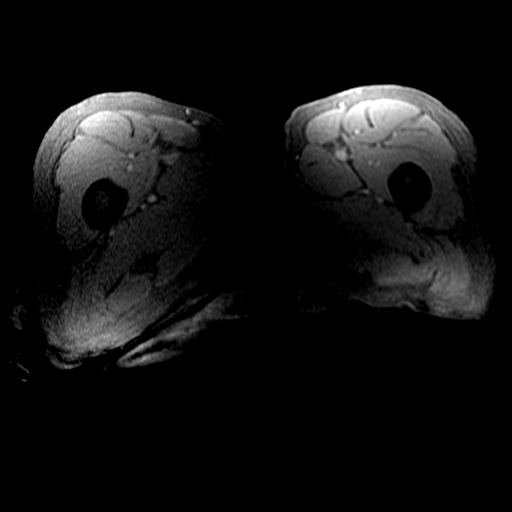

[Series 302: T1 dynamic · axial · 5.0mm · 0.70mm/px · 1 of 112 slices shown (3 of 28)]
[im 1/112]
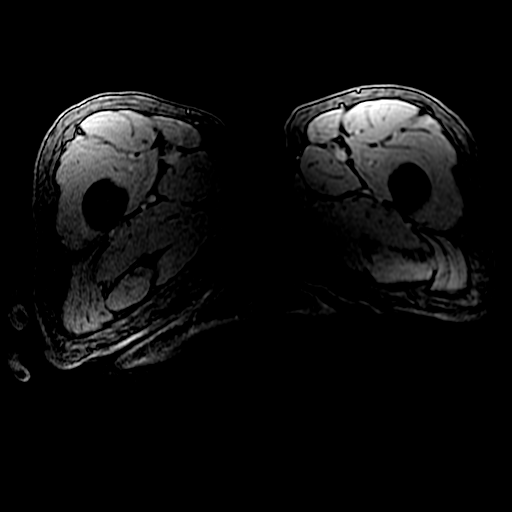

[Series 700: multiplanar reconstruction (mpr) · sagittal · 0.8mm · 0.39mm/px · 1 of 255 slices shown (1 of 2)]
[im 1/255]
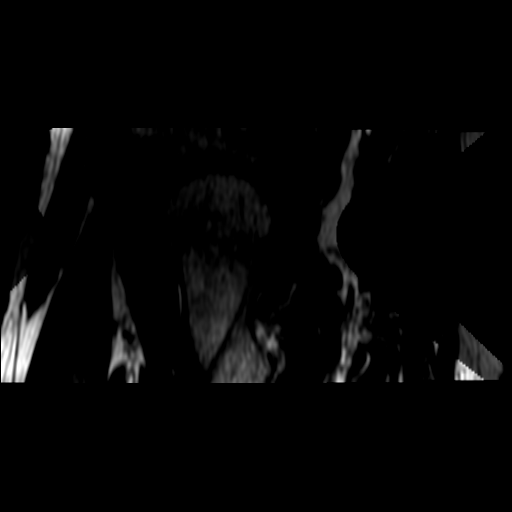

[Series 701: multiplanar reconstruction (mpr) · coronal · 0.8mm · 0.39mm/px · 1 of 256 slices shown (2 of 2)]
[im 1/256]
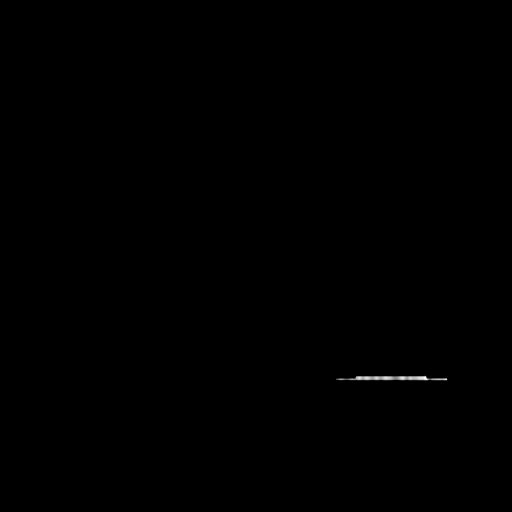

[Series 800: DWI · axial · 3.0mm · 0.78mm/px · 1 of 27 slices shown (2 of 2)]
[im 1/27]
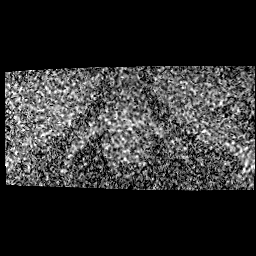

[Series 850: ADC · axial · 3.0mm · 0.78mm/px · 1 of 27 slices shown]
[im 1/27]
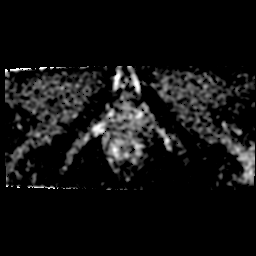

[Series 900: T1 dynamic · axial · 3.0mm · 0.78mm/px · 1 of 60 slices shown (4 of 28)]
[im 1/60]
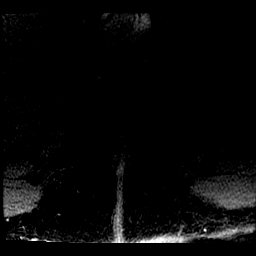

[Series 901: T1 dynamic · axial · 3.0mm · 0.78mm/px · 1 of 60 slices shown (5 of 28)]
[im 1/60]
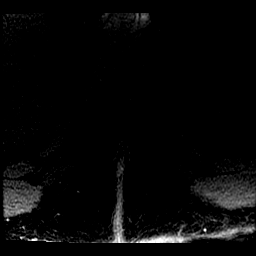

[Series 902: T1 dynamic · axial · 3.0mm · 0.78mm/px · 1 of 60 slices shown (6 of 28)]
[im 1/60]
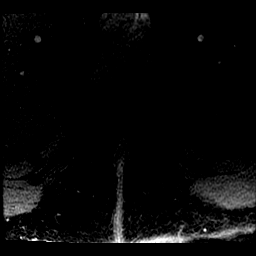

[Series 903: T1 dynamic · axial · 3.0mm · 0.78mm/px · 1 of 60 slices shown (7 of 28)]
[im 1/60]
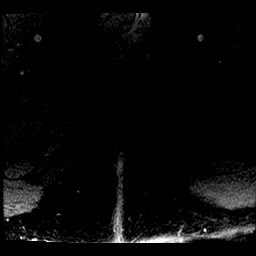

[Series 904: T1 dynamic · axial · 3.0mm · 0.78mm/px · 1 of 60 slices shown (8 of 28)]
[im 1/60]
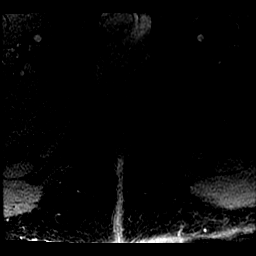

[Series 905: T1 dynamic · axial · 3.0mm · 0.78mm/px · 1 of 60 slices shown (9 of 28)]
[im 1/60]
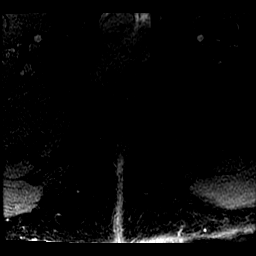

[Series 906: T1 dynamic · axial · 3.0mm · 0.78mm/px · 1 of 60 slices shown (10 of 28)]
[im 1/60]
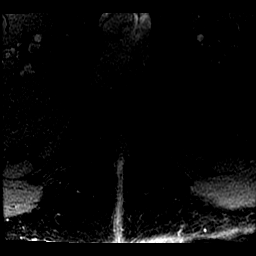

[Series 907: T1 dynamic · axial · 3.0mm · 0.78mm/px · 1 of 60 slices shown (11 of 28)]
[im 1/60]
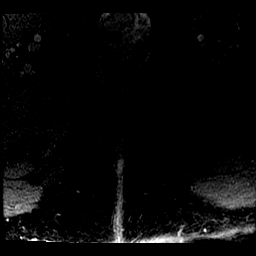

[Series 908: T1 dynamic · axial · 3.0mm · 0.78mm/px · 1 of 60 slices shown (12 of 28)]
[im 1/60]
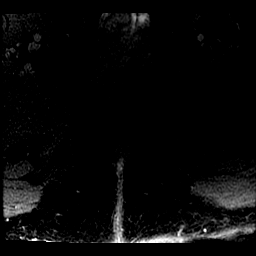

[Series 909: T1 dynamic · axial · 3.0mm · 0.78mm/px · 1 of 60 slices shown (13 of 28)]
[im 1/60]
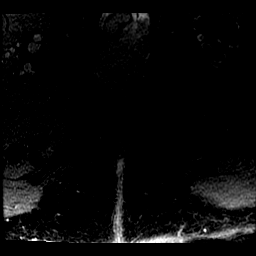

[Series 910: T1 dynamic · axial · 3.0mm · 0.78mm/px · 1 of 60 slices shown (14 of 28)]
[im 1/60]
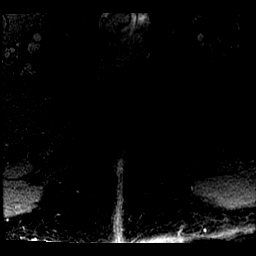

[Series 911: T1 dynamic · axial · 3.0mm · 0.78mm/px · 1 of 60 slices shown (15 of 28)]
[im 1/60]
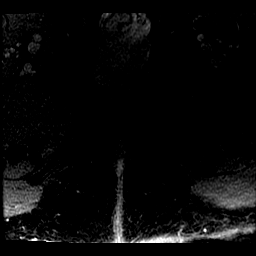

[Series 912: T1 dynamic · axial · 3.0mm · 0.78mm/px · 1 of 60 slices shown (16 of 28)]
[im 1/60]
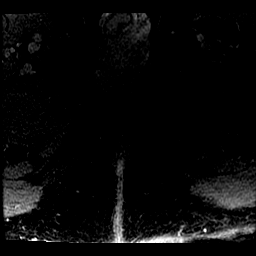

[Series 913: T1 dynamic · axial · 3.0mm · 0.78mm/px · 1 of 60 slices shown (17 of 28)]
[im 1/60]
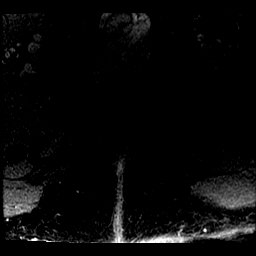

[Series 914: T1 dynamic · axial · 3.0mm · 0.78mm/px · 1 of 60 slices shown (18 of 28)]
[im 1/60]
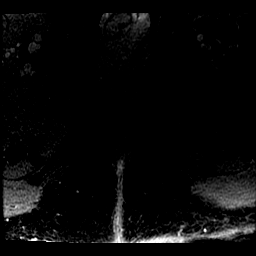

[Series 915: T1 dynamic · axial · 3.0mm · 0.78mm/px · 1 of 60 slices shown (19 of 28)]
[im 1/60]
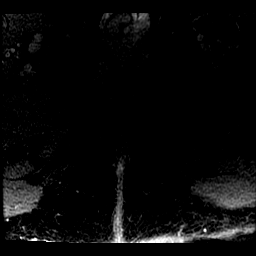

[Series 916: T1 dynamic · axial · 3.0mm · 0.78mm/px · 1 of 60 slices shown (20 of 28)]
[im 1/60]
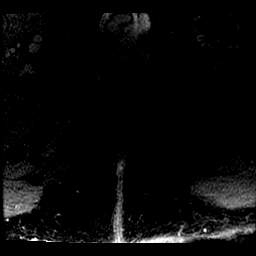

[Series 917: T1 dynamic · axial · 3.0mm · 0.78mm/px · 1 of 60 slices shown (21 of 28)]
[im 1/60]
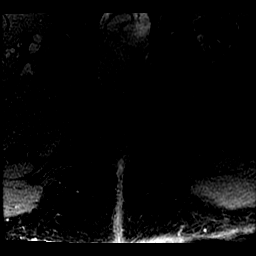

[Series 918: T1 dynamic · axial · 3.0mm · 0.78mm/px · 1 of 60 slices shown (22 of 28)]
[im 1/60]
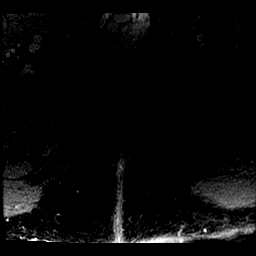

[Series 919: T1 dynamic · axial · 3.0mm · 0.78mm/px · 1 of 60 slices shown (23 of 28)]
[im 1/60]
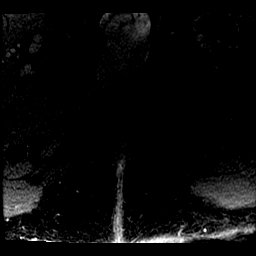

[Series 920: T1 dynamic · axial · 3.0mm · 0.78mm/px · 1 of 60 slices shown (24 of 28)]
[im 1/60]
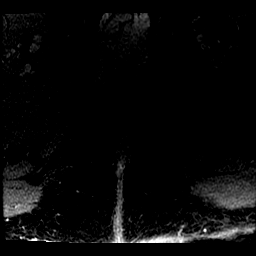

[Series 921: T1 dynamic · axial · 3.0mm · 0.78mm/px · 1 of 60 slices shown (25 of 28)]
[im 1/60]
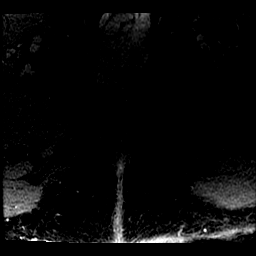

[Series 922: T1 dynamic · axial · 3.0mm · 0.78mm/px · 1 of 60 slices shown (26 of 28)]
[im 1/60]
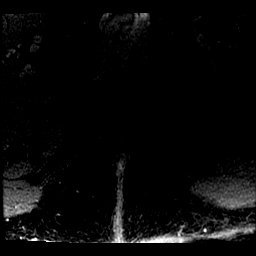

[Series 923: T1 dynamic · axial · 3.0mm · 0.78mm/px · 1 of 60 slices shown (27 of 28)]
[im 1/60]
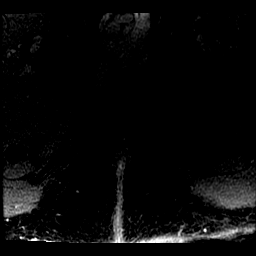

[Series 924: T1 dynamic · axial · 3.0mm · 0.78mm/px · 1 of 60 slices shown (28 of 28)]
[im 1/60]
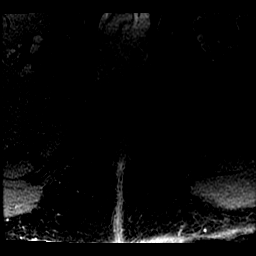

[((date))-((date)) · axial · 3.0mm · 0.78mm/px · 1 of 43 slices shown (1 of 4)]
[im 1/43]
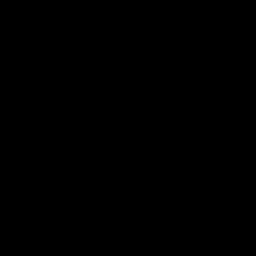

[((date))-((date)) · axial · 3.0mm · 0.78mm/px · 1 of 53 slices shown (2 of 4)]
[im 1/53]
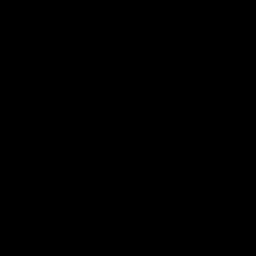

[((date))-((date)) · axial · 3.0mm · 0.78mm/px · 1 of 55 slices shown (3 of 4)]
[im 1/55]
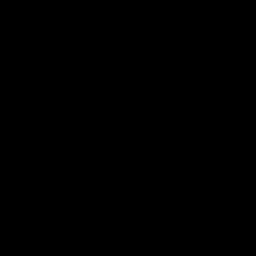

[((date))-((date)) · axial · 3.0mm · 0.78mm/px · 1 of 57 slices shown (4 of 4)]
[im 1/57]
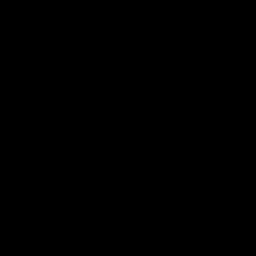

[40 of 56 positions shown; findings below may reference images not displayed]

FINDINGS: Prostate: Heterogeneous diffuse low T2 signal throughout the
peripheral zone with associated enhancement and restriction of
diffusion which may be inflammatory, but is difficult to exclude the
possibility of malignancy. Because of the diffuse infiltrative
appearance, it is difficult to assign a normal Pai rads
categorization. The appearance is most confluent in the
posteromedial peripheral zones and in the central zone and probably
has abnormal enhancement extending up into the left seminal vesicle
based on subtraction images, and accordingly raises substantial
suspicion for prostate cancer.

Region of interest # 1: PI-RADS category 5 lesion of the bilateral
posteromedial peripheral zone in the apex, mid gland, and base, as
well as the bilateral central zone, with confluent low T2 signal (as
on image 62, series 7) as well as restriction of diffusion and early
enhancement similar to the remainder of the peripheral zone. Given
the concern for left seminal vesicle invasion, this is considered
PI-RADS category 5, and has greater than 1.5 cm of capsular contact.
This region measures 3.02 cc (2.8 by 1.9 by 2.6 cm).

Volume: 3D volumetric analysis: Prostate volume 28.98 cc (4.3 by
by 3.8 cm).

Transcapsular spread: Region of interest # 1 has greater than 1.5 cm
of capsular contact which raises risk for occult transcapsular
spread. There is also some slight effacement of the normal acute
angulation between the rectum and prostate gland towards the apex.

Seminal vesicle involvement: Suspected on the left side based on
enhancement.

Neurovascular bundle involvement: Not directly visualized

Pelvic adenopathy: There is an enlarged left perirectal lymph node
at 0.7 cm in short axis on image 29 series 7. Right perirectal lymph
node 0.4 cm in short axis on image 26 series 7.

Bone metastasis: Absent

Other findings: Suspected testicular retraction along the spermatic
cords.
IMPRESSION: 1. Diffuse scattered infiltrative low T2 signal throughout the
peripheral zone of the prostate gland with widespread restriction of
diffusion in the peripheral zone and early enhancement. The T2
hypointensity is more confluent in the posteromedial peripheral
zones and central zones, with enhancing tissue believed to probably
extend up into the left seminal vesicle. High suspicion for prostate
cancer, especially posteriorly in the peripheral zone, with
suspicion for left seminal vesicle involvement. Biopsy is likely
warranted. Targeting data for the most confluent posterior region
sent to [facility].
2. Enlarged left perirectal lymph node and upper normal sized right
perirectal lymph node, significance uncertain.
3. Suspected testicular retraction along the spermatic cords.

## 2021-10-16 SURGERY — MRI WITH ANESTHESIA
Anesthesia: General

## 2021-10-16 MED ORDER — LACTATED RINGERS IV SOLN: INTRAVENOUS | Status: DC

## 2021-10-16 MED ORDER — CHLORHEXIDINE GLUCONATE 0.12 % MT SOLN
15.0000 mL | Freq: Once | OROMUCOSAL | Status: AC
  Administered 2021-10-16: 15 mL via OROMUCOSAL
  Filled 2021-10-16: qty 15

## 2021-10-16 MED ORDER — GADOBUTROL 1 MMOL/ML IV SOLN
7.5000 mL | Freq: Once | INTRAVENOUS | Status: AC | PRN
  Administered 2021-10-16: 7.5 mL via INTRAVENOUS

## 2021-10-16 MED ORDER — LIDOCAINE 2% (20 MG/ML) 5 ML SYRINGE
INTRAMUSCULAR | Status: DC | PRN
  Administered 2021-10-16: 40 mg via INTRAVENOUS

## 2021-10-16 MED ORDER — PHENYLEPHRINE 40 MCG/ML (10ML) SYRINGE FOR IV PUSH (FOR BLOOD PRESSURE SUPPORT)
PREFILLED_SYRINGE | INTRAVENOUS | Status: DC | PRN
  Administered 2021-10-16: 40 ug via INTRAVENOUS

## 2021-10-16 MED ORDER — DEXAMETHASONE SODIUM PHOSPHATE 10 MG/ML IJ SOLN
INTRAMUSCULAR | Status: DC | PRN
  Administered 2021-10-16: 5 mg via INTRAVENOUS

## 2021-10-16 MED ORDER — PHENYLEPHRINE HCL-NACL 20-0.9 MG/250ML-% IV SOLN
INTRAVENOUS | Status: DC | PRN
  Administered 2021-10-16: 15 ug/min via INTRAVENOUS

## 2021-10-16 MED ORDER — PROPOFOL 10 MG/ML IV BOLUS
INTRAVENOUS | Status: DC | PRN
  Administered 2021-10-16: 150 mg via INTRAVENOUS

## 2021-10-16 MED ORDER — ONDANSETRON HCL 4 MG/2ML IJ SOLN
INTRAMUSCULAR | Status: DC | PRN
  Administered 2021-10-16: 4 mg via INTRAVENOUS

## 2021-10-16 MED ORDER — ORAL CARE MOUTH RINSE: 15.0000 mL | Freq: Once | OROMUCOSAL | Status: AC

## 2021-10-16 MED ORDER — FENTANYL CITRATE (PF) 250 MCG/5ML IJ SOLN
INTRAMUSCULAR | Status: DC | PRN
  Administered 2021-10-16: 25 ug via INTRAVENOUS

## 2021-10-16 MED ORDER — GLYCOPYRROLATE 0.2 MG/ML IJ SOLN
INTRAMUSCULAR | Status: DC | PRN
  Administered 2021-10-16: .2 mg via INTRAVENOUS

## 2021-10-16 NOTE — Transfer of Care (Signed)
Immediate Anesthesia Transfer of Care Note  Patient: Antonio Navarro  Procedure(s) Performed: MRI WITH ANESTHESIA OF PROSTATE WITH AND WITHOUT CONTRAST  Patient Location: PACU  Anesthesia Type:General  Level of Consciousness: drowsy and patient cooperative  Airway & Oxygen Therapy: Patient Spontanous Breathing  Post-op Assessment: Report given to RN, Post -op Vital signs reviewed and stable and Patient moving all extremities X 4  Post vital signs: Reviewed and stable  Last Vitals:  Vitals Value Taken Time  BP 134/98 10/16/21 1112  Temp 36.7 C 10/16/21 1112  Pulse 111 10/16/21 1114  Resp 14 10/16/21 1114  SpO2 95 % 10/16/21 1114  Vitals shown include unvalidated device data.  Last Pain:  Vitals:   10/16/21 1112  PainSc: 0-No pain         Complications: No notable events documented.

## 2021-10-16 NOTE — Anesthesia Procedure Notes (Signed)
Procedure Name: LMA Insertion Date/Time: 10/16/2021 9:45 AM Performed by: Gaylene Brooks, CRNA Pre-anesthesia Checklist: Patient identified, Emergency Drugs available, Suction available and Patient being monitored Patient Re-evaluated:Patient Re-evaluated prior to induction Oxygen Delivery Method: Circle System Utilized Preoxygenation: Pre-oxygenation with 100% oxygen Induction Type: IV induction Ventilation: Mask ventilation without difficulty LMA: LMA inserted LMA Size: 5.0 Number of attempts: 1 Airway Equipment and Method: Bite block Placement Confirmation: positive ETCO2 Tube secured with: Tape Dental Injury: Teeth and Oropharynx as per pre-operative assessment  Comments: LMA 4 placed with audible leak. LMA removed and replaced with size 5. Much better seal obtained. +ETCO2, BBS=

## 2021-10-16 NOTE — Anesthesia Preprocedure Evaluation (Addendum)
Anesthesia Evaluation  Patient identified by MRN, date of birth, ID band Patient awake    Reviewed: Allergy & Precautions, NPO status , Patient's Chart, lab work & pertinent test results, reviewed documented beta blocker date and time   History of Anesthesia Complications (+) Family history of anesthesia reaction  Airway Mallampati: III  TM Distance: >3 FB Neck ROM: Full    Dental  (+) Poor Dentition, Dental Advisory Given   Pulmonary former smoker,    Pulmonary exam normal breath sounds clear to auscultation       Cardiovascular hypertension, Pt. on medications and Pt. on home beta blockers + CAD, + Past MI and + Cardiac Stents  Normal cardiovascular exam Rhythm:Regular Rate:Normal  Hx/o cardiac arrest in setting of STEMI 07/2020  PCI LCX with stent  Echo 08/05/21 1. Left ventricular ejection fraction, by estimation, is 55 to 60%. Left ventricular ejection fraction by PLAX is 58 %. The left ventricle has normal function. The left ventricle has no regional wall motion abnormalities. There is mild left ventricular  hypertrophy. Left ventricular diastolic parameters are consistent with  Grade I diastolic dysfunction (impaired relaxation).  2. Right ventricular systolic function is normal. The right ventricular size is normal.  3. The mitral valve is normal in structure. Trivial mitral valve regurgitation. No evidence of mitral stenosis.  4. The aortic valve is tricuspid. Aortic valve regurgitation is not visualized. No aortic stenosis is present.  5. The inferior vena cava is normal in size with greater than 50% respiratory variability, suggesting right atrial pressure of 3 mmHg.   EKG 02/26/21 NSR, Inferior MI, poor R wave progression   Neuro/Psych PSYCHIATRIC DISORDERS Depression Ischemic encephalopathy    GI/Hepatic negative GI ROS, Neg liver ROS,   Endo/Other  Hyperlipidemia  Renal/GU Renal diseaseHx/o renal calculi      Musculoskeletal   Abdominal   Peds  Hematology Plavix therapy- last dose 2/27   Anesthesia Other Findings   Reproductive/Obstetrics                            Anesthesia Physical Anesthesia Plan  ASA: 3  Anesthesia Plan: General   Post-op Pain Management: Minimal or no pain anticipated   Induction: Intravenous  PONV Risk Score and Plan: 2 and Treatment may vary due to age or medical condition and Ondansetron  Airway Management Planned: LMA  Additional Equipment:   Intra-op Plan:   Post-operative Plan: Extubation in OR  Informed Consent: I have reviewed the patients History and Physical, chart, labs and discussed the procedure including the risks, benefits and alternatives for the proposed anesthesia with the patient or authorized representative who has indicated his/her understanding and acceptance.     Dental advisory given  Plan Discussed with: CRNA and Anesthesiologist  Anesthesia Plan Comments:         Anesthesia Quick Evaluation

## 2021-10-16 NOTE — Anesthesia Postprocedure Evaluation (Signed)
Anesthesia Post Note  Patient: ZYGMUND PASSERO  Procedure(s) Performed: MRI WITH ANESTHESIA OF PROSTATE WITH AND WITHOUT CONTRAST     Patient location during evaluation: PACU Anesthesia Type: General Level of consciousness: awake and alert and oriented Pain management: pain level controlled Vital Signs Assessment: post-procedure vital signs reviewed and stable Respiratory status: spontaneous breathing, nonlabored ventilation and respiratory function stable Cardiovascular status: blood pressure returned to baseline and stable Postop Assessment: no apparent nausea or vomiting Anesthetic complications: no   No notable events documented.  Last Vitals:  Vitals:   10/16/21 1112 10/16/21 1127  BP: (!) 134/98 (!) 138/94  Pulse: (!) 115 (!) 112  Resp: 12 10  Temp: 36.7 C   SpO2: 95% 94%    Last Pain:  Vitals:   10/16/21 1112  PainSc: 0-No pain                 Shaianne Nucci A.

## 2021-10-17 ENCOUNTER — Encounter (HOSPITAL_COMMUNITY): Payer: Self-pay | Admitting: Radiology

## 2021-10-17 DIAGNOSIS — C61 Malignant neoplasm of prostate: Secondary | ICD-10-CM

## 2021-10-17 HISTORY — DX: Malignant neoplasm of prostate: C61

## 2021-10-29 ENCOUNTER — Telehealth: Payer: Self-pay | Admitting: Cardiology

## 2021-10-29 ENCOUNTER — Other Ambulatory Visit: Payer: Self-pay | Admitting: Urology

## 2021-10-29 MED ORDER — FLEET ENEMA 7-19 GM/118ML RE ENEM: 1.0000 | ENEMA | Freq: Once | RECTAL | Status: DC

## 2021-10-29 NOTE — Telephone Encounter (Signed)
? ?  Pre-operative Risk Assessment  ?  ?Patient Name: Antonio Navarro  ?DOB: 05-14-1961 ?MRN: [id number]  ? ?  ? ?Request for Surgical Clearance   ? ?Procedure:   Prostate Ultrasound and Biopsy  ? ?Date of Surgery:  Clearance 11/13/21                              ?   ?Surgeon:  Dr. Terrilee Files  ?Surgeon's Group or Practice Name:  [facility]  ?Phone number:  [phone] ?Fax number:  [phone] ?  ?Type of Clearance Requested:   ?- Medical  ?- Pharmacy:  Hold Clopidogrel (Plavix) 5 days prior ?  ?Type of Anesthesia:   Choice  ?  ?Additional requests/questions:   ? ?Signed, ?Trilby Drummer   ?10/29/2021, 3:32 PM   ?

## 2021-10-30 NOTE — Telephone Encounter (Signed)
Left message for the pt to call the office ASAP for an appt for pre op clearance. I was going to offer the pt an appt today with DOD Dr. Harrington Challenger @ 4:30. Per pre op provider pt needs in person appt due to cardiac h/x. Requesting office is asking to hold Plavix x 5 days and procedure is scheduled for 11/07/21. Ok per Alden Hipp RN to use 4:30 acute time slot today.  ?

## 2021-10-30 NOTE — Telephone Encounter (Signed)
I was able to s/w the pt's wife. I explained the pt will need an appt for pre op clearance. I did offer DOD 4:30 today, but she could not bring him in. Pt has been scheduled to see DOD Dr. Gasper Sells 10/31/21 @ 3 pm. Pt's wife thanked me for the call and the help that our office has given. I will forward clearance to MD for upcoming appt tomorrow. Will send FYI to requesting office the pt has appt tomorrow 10/31/21. ?

## 2021-10-30 NOTE — Telephone Encounter (Signed)
? ?  Name: Antonio Navarro  ?DOB: 04-20-1961  ?MRN: [id number] ? ?Primary Cardiologist: Fransico Him, MD ? ?Chart reviewed as part of pre-operative protocol coverage. Because of Jewelz Kobus Lamons's past medical history and time since last visit, he will require a follow-up visit in order to better assess preoperative cardiovascular risk. ? ?Pre-op covering staff: ?- Please schedule appointment and call patient to inform them. If patient already had an upcoming appointment within acceptable timeframe, please add "pre-op clearance" to the appointment notes so provider is aware. ?- Please contact requesting surgeon's office via preferred method (i.e, phone, fax) to inform them of need for appointment prior to surgery. ? ?If applicable, this message will also be routed to pharmacy pool and/or primary cardiologist for input on holding anticoagulant/antiplatelet agent as requested below so that this information is available to the clearing provider at time of patient's appointment.  ? ? ? ?Need appt on or before 3/22 as the patient need to hold plavix for 5 days prior to the procedure.  ? ? ? ?Almyra Deforest, [place]  ?10/30/2021, 9:10 AM  ? ?

## 2021-10-31 ENCOUNTER — Ambulatory Visit (INDEPENDENT_AMBULATORY_CARE_PROVIDER_SITE_OTHER): Payer: Medicaid Other | Admitting: Internal Medicine

## 2021-10-31 ENCOUNTER — Encounter: Payer: Self-pay | Admitting: Internal Medicine

## 2021-10-31 ENCOUNTER — Other Ambulatory Visit: Payer: Self-pay

## 2021-10-31 VITALS — BP 81/56 | HR 74 | Ht 72.0 in | Wt 165.0 lb

## 2021-10-31 DIAGNOSIS — I2583 Coronary atherosclerosis due to lipid rich plaque: Secondary | ICD-10-CM

## 2021-10-31 DIAGNOSIS — I10 Essential (primary) hypertension: Secondary | ICD-10-CM

## 2021-10-31 DIAGNOSIS — I255 Ischemic cardiomyopathy: Secondary | ICD-10-CM

## 2021-10-31 DIAGNOSIS — I2510 Atherosclerotic heart disease of native coronary artery without angina pectoris: Secondary | ICD-10-CM

## 2021-10-31 MED ORDER — ISOSORBIDE MONONITRATE ER 30 MG PO TB24: 15.0000 mg | ORAL_TABLET | Freq: Every day | ORAL | 3 refills | Status: DC

## 2021-10-31 NOTE — H&P (View-Only) (Signed)
?Cardiology Office Note:   ? ?Date:  10/31/2021  ? ?ID:  Antonio Navarro, DOB 03/23/1961, MRN [id number] ? ?PCP:  Fredrich Romans, [place] ?  ?[facility] Providers ?Cardiologist:  Fransico Him, MD    ? ?Referring MD: Fredrich Romans, PA  ? ?CC: preoperative risk stratification ? ?History of Present Illness:   ? ?Antonio Navarro is a 61 y.o. male with a hx of CAD s/p prior STEMI and cardiac arrest (2021), ICM, HTN seen for evaluation of prostate [place] and biopsy. ? ?Patient notes that he is doing fine.   ?Notes that he does not have chest pain; did not have chest pain prior ?He is pretty much bedridden. ?He has an MRI that is concerning and if getting a biopsy.  ?In the past he used to complain of heart burn. ? ?No chest pain or pressure .  No SOB- he is wheelchair bound.  He cannot do much on his own.  Has in home therapy. . No palpitations or syncope. ?Patient denies any complaint and re-iterates that he is alright. ? ? ?Past Medical History:  ?Diagnosis Date  ? CAD (coronary artery disease), native coronary artery 07/2020  ? s/p cardiac arrest in setting of STEMI with cath showing 40%pRCA and 95% mLCx s/p PCI  ? Family history of adverse reaction to anesthesia   ? sister has had a hard time waking after surgery  ? History of kidney stones   ? HLD (hyperlipidemia)   ? Hypertension   ? Ischemic cardiomyopathy 07/2020  ? EF 40% at time of MI  ? ? ?Past Surgical History:  ?Procedure Laterality Date  ? CORONARY/GRAFT ACUTE MI REVASCULARIZATION N/A 07/22/2020  ? Procedure: Coronary/Graft Acute MI Revascularization;  Surgeon: Lorretta Harp, MD;  Location: [facility];  Service: Cardiovascular;  Laterality: N/A;  ? IR GASTROSTOMY TUBE MOD SED  08/17/2020  ? IR GASTROSTOMY TUBE REMOVAL  11/08/2020  ? LEFT HEART CATH AND CORONARY ANGIOGRAPHY N/A 07/22/2020  ? Procedure: LEFT HEART CATH AND CORONARY ANGIOGRAPHY;  Surgeon: Lorretta Harp, MD;  Location: [facility];  Service: Cardiovascular;  Laterality: N/A;  ?  RADIOLOGY WITH ANESTHESIA N/A 10/16/2021  ? Procedure: MRI WITH ANESTHESIA OF PROSTATE WITH AND WITHOUT CONTRAST;  Surgeon: Radiologist, Medication, MD;  Location: [facility];  Service: Radiology;  Laterality: N/A;  ? ? ?Current Medications: ?Current Meds  ?Medication Sig  ? acetaminophen (TYLENOL) 325 MG tablet Take 2 tablets (650 mg total) by mouth every 6 (six) hours as needed for mild pain, fever or headache.  ? aspirin 81 MG chewable tablet Chew 1 tablet (81 mg total) by mouth daily.  ? atorvastatin (LIPITOR) 80 MG tablet Take 1 tablet (80 mg total) by mouth daily.  ? clopidogrel (PLAVIX) 75 MG tablet Take 1 tablet (75 mg total) by mouth daily.  ? isosorbide mononitrate (IMDUR) 30 MG 24 hr tablet Take 0.5 tablets (15 mg total) by mouth daily.  ? losartan (COZAAR) 25 MG tablet Take 1 tablet (25 mg total) by mouth daily.  ? metoprolol succinate (TOPROL-XL) 25 MG 24 hr tablet Take 0.5 tablets (12.5 mg total) by mouth daily. (Patient taking differently: Take 12.5 mg by mouth 2 (two) times daily.)  ? polyethylene glycol (MIRALAX / GLYCOLAX) 17 g packet Take 17 g by mouth daily as needed for moderate constipation.  ? Vitamin D, Ergocalciferol, (DRISDOL) 1.25 MG (50000 UNIT) CAPS capsule Take 50,000 Units by mouth once a week.  ? [DISCONTINUED] isosorbide mononitrate (IMDUR) 30  MG 24 hr tablet Take 1 tablet (30 mg total) by mouth daily.  ?  ? ?Allergies:   Patient has no known allergies.  ? ?Social History  ? ?Socioeconomic History  ? Marital status: Single  ?  Spouse name: Not on file  ? Number of children: Not on file  ? Years of education: Not on file  ? Highest education level: Not on file  ?Occupational History  ? Not on file  ?Tobacco Use  ? Smoking status: Former  ?  Packs/day: 1.00  ?  Types: Cigarettes  ? Smokeless tobacco: Never  ?Vaping Use  ? Vaping Use: Never used  ?Substance and Sexual Activity  ? Alcohol use: Not Currently  ?  Comment: occasional  ? Drug use: Not Currently  ?  Types: Marijuana  ?  Comment:  occ  ? Sexual activity: Not on file  ?Other Topics Concern  ? Not on file  ?Social History Narrative  ? Not on file  ? ?Social Determinants of Health  ? ?Financial Resource Strain: Not on file  ?Food Insecurity: Not on file  ?Transportation Needs: Not on file  ?Physical Activity: Not on file  ?Stress: Not on file  ?Social Connections: Not on file  ?  ? ?Family History: ?The patient's family history includes Cancer in his mother; Hypertension in his father. ? ?ROS:   ?Please see the history of present illness.    ? All other systems reviewed and are negative. ? ?EKGs/Labs/Other Studies Reviewed:   ? ?The following studies were reviewed today: ? ? ?EKG:  EKG is  ordered today.  The ekg ordered today demonstrates  ?10/31/21: SR rate 74 ? ?Recent Labs: ?04/06/2021: Hemoglobin 13.2; Platelets 156 ?06/04/2021: ALT 22 ?10/16/2021: BUN 16; Creatinine, Ser 1.23; Potassium 3.9; Sodium 142  ?Recent Lipid Panel ?   ?Component Value Date/Time  ? CHOL 92 (L) 06/04/2021 0848  ? TRIG 45 06/04/2021 0848  ? HDL 48 06/04/2021 0848  ? CHOLHDL 1.9 06/04/2021 0848  ? [place] 32 06/04/2021 0848  ? ?    ? ?Physical Exam:   ? ?VS:  BP (!) 81/56   Pulse 74   Ht 6' (1.829 m)   Wt 165 lb (74.8 kg)   SpO2 96%   BMI 22.38 kg/m?    ? ?Wt Readings from Last 3 Encounters:  ?10/31/21 165 lb (74.8 kg)  ?10/16/21 170 lb (77.1 kg)  ?06/04/21 157 lb 12.8 oz (71.6 kg)  ?  ?Gen: no distress, Appears older than stated age   ?Neck: No JVD  ?Cardiac: No Rubs or Gallops, no Murmur, RRR +2 radial pulses ?Respiratory: Clear to auscultation bilaterally, normal effort, normal  respiratory rate ?GI: Soft, nontender, non-distended  ?MS: No  edema; move BUE 5/5, 4/5 LE ?Integument: Skin feels warm ?Neuro:  At time of evaluation, alert and oriented to person place and time ?Psych: Normal affect, patient feels fine ? ? ?ASSESSMENT:   ? ?1. Coronary artery disease due to lipid rich plaque   ?2. Ischemic cardiomyopathy   ?3. Essential hypertension   ? ?PLAN:    ? ?Preoperative Risk Assessment ?- The Revised Cardiac Risk Index = 2  CAD, and CHF (now recovered) which equates to 6.6%: moderate estimated risk of perioperative myocardial infarction, pulmonary edema, ventricular fibrillation, cardiac arrest, or complete heart block.  ?- He is planned for a low risk procedure ( prostate biopsy) ?- we have discussed the cardiac risks with patient and family; it could be reasonable to proceed with low  risk procedures without cardiac testing, if he were to need riskier procedure would recommend a lexiscan prior to surgery given his low functional status ? ? ?CAD ?HFRecovered EF ?- continue ASA and plavix (had been planned indefinite) but this can be stopped for his biopsy as per urology ?- family notes labile BP as high as 170 during the MRI; will cute Imdur to 15 mg and continue BB and ARB for now; given lack of CP, may end up coming of IMDUR for BP reasons ? ?Given that he may end up needing pre-op for more invasive surgery, would have patient f/u with Dr. Radford Pax or her APP in 3-4 months ? ? ? ?   ? ?Shared Decision Making/Informed Consent ?The risks [chest pain, shortness of breath, cardiac arrhythmias, dizziness, blood pressure fluctuations, myocardial infarction, stroke/transient ischemic attack, nausea, vomiting, allergic reaction, radiation exposure, metallic taste sensation and life-threatening complications (estimated to be 1 in 10,000)], benefits (risk stratification, diagnosing coronary artery disease, treatment guidance) and alternatives of a nuclear stress test were discussed in detail with Mr. Denk and he agrees to proceed.  ? ? ?Medication Adjustments/Labs and Tests Ordered: ?Current medicines are reviewed at length with the patient today.  Concerns regarding medicines are outlined above.  ?Orders Placed This Encounter  ?Procedures  ? EKG 12-Lead  ? ?Meds ordered this encounter  ?Medications  ? isosorbide mononitrate (IMDUR) 30 MG 24 hr tablet  ?  Sig: Take 0.5  tablets (15 mg total) by mouth daily.  ?  Dispense:  45 tablet  ?  Refill:  3  ? ? ?Patient Instructions  ?Medication Instructions:  ?Your physician has recommended you make the following change in your medication:  ?DECREASE:

## 2021-10-31 NOTE — Patient Instructions (Signed)
Medication Instructions:  ?Your physician has recommended you make the following change in your medication:  ?DECREASE: isosorbide mononitrate (Imdur) to 15 mg by mouth once daily ? ?*If you need a refill on your cardiac medications before your next appointment, please call your pharmacy* ? ? ?Lab Work: ?NONE ?If you have labs (blood work) drawn today and your tests are completely normal, you will receive your results only by: ?MyChart Message (if you have MyChart) OR ?A paper copy in the mail ?If you have any lab test that is abnormal or we need to change your treatment, we will call you to review the results. ? ? ?Testing/Procedures: ?NONE ? ? ?Follow-Up: ?At [facility], you and your health needs are our priority.  As part of our continuing mission to provide you with exceptional heart care, we have created designated Provider Care Teams.  These Care Teams include your primary Cardiologist (physician) and Advanced Practice Providers (APPs -  Physician Assistants and Nurse Practitioners) who all work together to provide you with the care you need, when you need it. ? ? ?Your next appointment:   ?3-4 month(s) ? ?The format for your next appointment:   ?In Person ? ?Provider:   ?Fransico Him, MD  or Melina Copa, PA-C or Ermalinda Barrios, PA-C      ? ? ?

## 2021-10-31 NOTE — Progress Notes (Signed)
Cardiology Office Note:    Date:  10/31/2021   ID:  Antonio Navarro, DOB August 01, 1961, MRN [id number]  PCP:  Burnice Logan, PA   [facility] Providers Cardiologist:  Armanda Magic, MD     Referring MD: Burnice Logan, PA   CC: preoperative risk stratification  History of Present Illness:    Antonio Navarro is a 61 y.o. male with a hx of CAD s/p prior STEMI and cardiac arrest (2021), ICM, HTN seen for evaluation of prostate [place] and biopsy.  Patient notes that he is doing fine.   Notes that he does not have chest pain; did not have chest pain prior He is pretty much bedridden. He has an MRI that is concerning and if getting a biopsy.  In the past he used to complain of heart burn.  No chest pain or pressure .  No SOB- he is wheelchair bound.  He cannot do much on his own.  Has in home therapy. . No palpitations or syncope. Patient denies any complaint and re-iterates that he is alright.   Past Medical History:  Diagnosis Date   CAD (coronary artery disease), native coronary artery 07/2020   s/p cardiac arrest in setting of STEMI with cath showing 40%pRCA and 95% mLCx s/p PCI   Family history of adverse reaction to anesthesia    sister has had a hard time waking after surgery   History of kidney stones    HLD (hyperlipidemia)    Hypertension    Ischemic cardiomyopathy 07/2020   EF 40% at time of MI    Past Surgical History:  Procedure Laterality Date   CORONARY/GRAFT ACUTE MI REVASCULARIZATION N/A 07/22/2020   Procedure: Coronary/Graft Acute MI Revascularization;  Surgeon: Runell Gess, MD;  Location: [facility] INVASIVE CV LAB;  Service: Cardiovascular;  Laterality: N/A;   IR GASTROSTOMY TUBE MOD SED  08/17/2020   IR GASTROSTOMY TUBE REMOVAL  11/08/2020   LEFT HEART CATH AND CORONARY ANGIOGRAPHY N/A 07/22/2020   Procedure: LEFT HEART CATH AND CORONARY ANGIOGRAPHY;  Surgeon: Runell Gess, MD;  Location: [facility] INVASIVE CV LAB;  Service: Cardiovascular;  Laterality: N/A;    RADIOLOGY WITH ANESTHESIA N/A 10/16/2021   Procedure: MRI WITH ANESTHESIA OF PROSTATE WITH AND WITHOUT CONTRAST;  Surgeon: Radiologist, Medication, MD;  Location: [facility] OR;  Service: Radiology;  Laterality: N/A;    Current Medications: Current Meds  Medication Sig   acetaminophen (TYLENOL) 325 MG tablet Take 2 tablets (650 mg total) by mouth every 6 (six) hours as needed for mild pain, fever or headache.   aspirin 81 MG chewable tablet Chew 1 tablet (81 mg total) by mouth daily.   atorvastatin (LIPITOR) 80 MG tablet Take 1 tablet (80 mg total) by mouth daily.   clopidogrel (PLAVIX) 75 MG tablet Take 1 tablet (75 mg total) by mouth daily.   isosorbide mononitrate (IMDUR) 30 MG 24 hr tablet Take 0.5 tablets (15 mg total) by mouth daily.   losartan (COZAAR) 25 MG tablet Take 1 tablet (25 mg total) by mouth daily.   metoprolol succinate (TOPROL-XL) 25 MG 24 hr tablet Take 0.5 tablets (12.5 mg total) by mouth daily. (Patient taking differently: Take 12.5 mg by mouth 2 (two) times daily.)   polyethylene glycol (MIRALAX / GLYCOLAX) 17 g packet Take 17 g by mouth daily as needed for moderate constipation.   Vitamin D, Ergocalciferol, (DRISDOL) 1.25 MG (50000 UNIT) CAPS capsule Take 50,000 Units by mouth once a week.   [DISCONTINUED] isosorbide mononitrate (IMDUR) 30  MG 24 hr tablet Take 1 tablet (30 mg total) by mouth daily.     Allergies:   Patient has no known allergies.   Social History   Socioeconomic History   Marital status: Single    Spouse name: Not on file   Number of children: Not on file   Years of education: Not on file   Highest education level: Not on file  Occupational History   Not on file  Tobacco Use   Smoking status: Former    Packs/day: 1.00    Types: Cigarettes   Smokeless tobacco: Never  Vaping Use   Vaping Use: Never used  Substance and Sexual Activity   Alcohol use: Not Currently    Comment: occasional   Drug use: Not Currently    Types: Marijuana    Comment:  occ   Sexual activity: Not on file  Other Topics Concern   Not on file  Social History Narrative   Not on file   Social Determinants of Health   Financial Resource Strain: Not on file  Food Insecurity: Not on file  Transportation Needs: Not on file  Physical Activity: Not on file  Stress: Not on file  Social Connections: Not on file     Family History: The patient's family history includes Cancer in his mother; Hypertension in his father.  ROS:   Please see the history of present illness.     All other systems reviewed and are negative.  EKGs/Labs/Other Studies Reviewed:    The following studies were reviewed today:   EKG:  EKG is  ordered today.  The ekg ordered today demonstrates  10/31/21: SR rate 74  Recent Labs: 04/06/2021: Hemoglobin 13.2; Platelets 156 06/04/2021: ALT 22 10/16/2021: BUN 16; Creatinine, Ser 1.23; Potassium 3.9; Sodium 142  Recent Lipid Panel    Component Value Date/Time   CHOL 92 (L) 06/04/2021 0848   TRIG 45 06/04/2021 0848   HDL 48 06/04/2021 0848   CHOLHDL 1.9 06/04/2021 0848   LDLCALC 32 06/04/2021 0848        Physical Exam:    VS:  BP (!) 81/56   Pulse 74   Ht 6' (1.829 m)   Wt 165 lb (74.8 kg)   SpO2 96%   BMI 22.38 kg/m     Wt Readings from Last 3 Encounters:  10/31/21 165 lb (74.8 kg)  10/16/21 170 lb (77.1 kg)  06/04/21 157 lb 12.8 oz (71.6 kg)    Gen: no distress, Appears older than stated age   Neck: No JVD  Cardiac: No Rubs or Gallops, no Murmur, RRR +2 radial pulses Respiratory: Clear to auscultation bilaterally, normal effort, normal  respiratory rate GI: Soft, nontender, non-distended  MS: No  edema; move BUE 5/5, 4/5 LE Integument: Skin feels warm Neuro:  At time of evaluation, alert and oriented to person place and time Psych: Normal affect, patient feels fine   ASSESSMENT:    1. Coronary artery disease due to lipid rich plaque   2. Ischemic cardiomyopathy   3. Essential hypertension    PLAN:     Preoperative Risk Assessment - The Revised Cardiac Risk Index = 2  CAD, and CHF (now recovered) which equates to 6.6%: moderate estimated risk of perioperative myocardial infarction, pulmonary edema, ventricular fibrillation, cardiac arrest, or complete heart block.  - He is planned for a low risk procedure ( prostate biopsy) - we have discussed the cardiac risks with patient and family; it could be reasonable to proceed with low  risk procedures without cardiac testing, if he were to need riskier procedure would recommend a lexiscan prior to surgery given his low functional status   CAD HFRecovered EF - continue ASA and plavix (had been planned indefinite) but this can be stopped for his biopsy as per urology - family notes labile BP as high as 170 during the MRI; will cute Imdur to 15 mg and continue BB and ARB for now; given lack of CP, may end up coming of IMDUR for BP reasons  Given that he may end up needing pre-op for more invasive surgery, would have patient f/u with Dr. Mayford Knife or her APP in 3-4 months        Shared Decision Making/Informed Consent The risks [chest pain, shortness of breath, cardiac arrhythmias, dizziness, blood pressure fluctuations, myocardial infarction, stroke/transient ischemic attack, nausea, vomiting, allergic reaction, radiation exposure, metallic taste sensation and life-threatening complications (estimated to be 1 in 10,000)], benefits (risk stratification, diagnosing coronary artery disease, treatment guidance) and alternatives of a nuclear stress test were discussed in detail with Mr. Guttilla and he agrees to proceed.    Medication Adjustments/Labs and Tests Ordered: Current medicines are reviewed at length with the patient today.  Concerns regarding medicines are outlined above.  Orders Placed This Encounter  Procedures   EKG 12-Lead   Meds ordered this encounter  Medications   isosorbide mononitrate (IMDUR) 30 MG 24 hr tablet    Sig: Take 0.5  tablets (15 mg total) by mouth daily.    Dispense:  45 tablet    Refill:  3    Patient Instructions  Medication Instructions:  Your physician has recommended you make the following change in your medication:  DECREASE: isosorbide mononitrate (Imdur) to 15 mg by mouth once daily  *If you need a refill on your cardiac medications before your next appointment, please call your pharmacy*   Lab Work: NONE If you have labs (blood work) drawn today and your tests are completely normal, you will receive your results only by: MyChart Message (if you have MyChart) OR A paper copy in the mail If you have any lab test that is abnormal or we need to change your treatment, we will call you to review the results.   Testing/Procedures: NONE   Follow-Up: At [facility], you and your health needs are our priority.  As part of our continuing mission to provide you with exceptional heart care, we have created designated Provider Care Teams.  These Care Teams include your primary Cardiologist (physician) and Advanced Practice Providers (APPs -  Physician Assistants and Nurse Practitioners) who all work together to provide you with the care you need, when you need it.   Your next appointment:   3-4 month(s)  The format for your next appointment:   In Person  Provider:   Armanda Magic, MD  or Ronie Spies, PA-C or Jacolyn Reedy, PA-C          Signed, Christell Constant, MD  10/31/2021 4:05 PM    [facility]

## 2021-11-02 NOTE — Progress Notes (Signed)
DUE TO COVID-19 ONLY ONE VISITOR IS ALLOWED TO COME WITH YOU AND STAY IN THE WAITING ROOM ONLY DURING PRE OP AND PROCEDURE DAY OF SURGERY.  2 VISITOR  MAY VISIT WITH YOU AFTER SURGERY IN YOUR PRIVATE ROOM DURING VISITING HOURS ONLY! ?YOU MAY HAVE ONE PERSON SPEND THE NITE WITH YOU IN YOUR ROOM AFTER SURGERY.   ? ? ? ? Your procedure is scheduled on:  ?  11/13/21.  ? Report to [facility] Main  Entrance ? ? Report to admitting at        [place]          AM ?DO NOT [place], PICTURE ID OR WALLET DAY OF SURGERY.  ?  ? ? Call this number if you have problems the morning of surgery [phone]  ? ? REMEMBER: NO  SOLID FOODS , CANDY, GUM OR MINTS AFTER MIDNITE THE NITE BEFORE SURGERY .       [place] Kitchen CLEAR LIQUIDS UNTIL      0800am           DAY OF SURGERY.       ? ? ?CLEAR LIQUID DIET ? ? ?Foods Allowed      ?WATER ?BLACK COFFEE ( SUGAR OK, NO MILK, CREAM OR CREAMER) REGULAR AND DECAF  ?TEA ( SUGAR OK NO MILK, CREAM, OR CREAMER) REGULAR AND DECAF  ?PLAIN JELLO ( NO RED)  ?FRUIT ICES ( NO RED, NO FRUIT PULP)  ?POPSICLES ( NO RED)  ?JUICE- APPLE, WHITE GRAPE AND WHITE CRANBERRY  ?SPORT DRINK LIKE GATORADE ( NO RED)  ?CLEAR BROTH ( VEGETABLE , CHICKEN OR BEEF)                                                               ? ?    ? ?BRUSH YOUR TEETH MORNING OF SURGERY AND RINSE YOUR MOUTH OUT, NO CHEWING GUM CANDY OR MINTS. ?  ? ? Take these medicines the morning of surgery with A SIP OF WATER:  imdur, toprol  ? ? ?DO NOT TAKE ANY DIABETIC MEDICATIONS DAY OF YOUR SURGERY ?                  ?            You may not have any metal on your body including hair pins and  ?            piercings  Do not wear jewelry, make-up, lotions, powders or perfumes, deodorant ?            Do not wear nail polish on your fingernails.   ?           IF YOU ARE A MALE AND WANT TO SHAVE UNDER ARMS OR LEGS PRIOR TO SURGERY YOU MUST DO SO AT LEAST 48 HOURS PRIOR TO SURGERY.  ?            Men may shave face and neck. ? ? Do not bring  valuables to the hospital. [place] NOT ?            RESPONSIBLE   FOR VALUABLES. ? Contacts, dentures or bridgework may not be worn into surgery. ? Leave suitcase in the car. After surgery it may be brought to your room. ? ?  ?  Patients discharged the day of surgery will not be allowed to drive home. IF YOU ARE HAVING SURGERY AND GOING HOME THE SAME DAY, YOU MUST HAVE AN ADULT TO DRIVE YOU HOME AND BE WITH YOU FOR 24 HOURS. YOU MAY GO HOME BY TAXI OR UBER OR ORTHERWISE, BUT AN ADULT MUST ACCOMPANY YOU HOME AND STAY WITH YOU FOR 24 HOURS. ?  ? ?            Please read over the following fact sheets you were given: ?_____________________________________________________________________ ? ?[place] - Preparing for Surgery ?Before surgery, you can play an important role.  Because skin is not sterile, your skin needs to be as free of germs as possible.  You can reduce the number of germs on your skin by washing with CHG (chlorahexidine gluconate) soap before surgery.  CHG is an antiseptic cleaner which kills germs and bonds with the skin to continue killing germs even after washing. ?Please DO NOT use if you have an allergy to CHG or antibacterial soaps.  If your skin becomes reddened/irritated stop using the CHG and inform your nurse when you arrive at Short Stay. ?Do not shave (including legs and underarms) for at least 48 hours prior to the first CHG shower.  You may shave your face/neck. ?Please follow these instructions carefully: ? 1.  Shower with CHG Soap the night before surgery and the  morning of Surgery. ? 2.  If you choose to wash your hair, wash your hair first as usual with your  normal  shampoo. ? 3.  After you shampoo, rinse your hair and body thoroughly to remove the  shampoo.                           4.  Use CHG as you would any other liquid soap.  You can apply chg directly  to the skin and wash  ?                     Gently with a scrungie or clean washcloth. ? 5.  Apply the CHG Soap to your  body ONLY FROM THE NECK DOWN.   Do not use on face/ open      ?                     Wound or open sores. Avoid contact with eyes, ears mouth and genitals (private parts).  ?                     Production manager,  Genitals (private parts) with your normal soap. ?            6.  Wash thoroughly, paying special attention to the area where your surgery  will be performed. ? 7.  Thoroughly rinse your body with warm water from the neck down. ? 8.  DO NOT shower/wash with your normal soap after using and rinsing off  the CHG Soap. ?               9.  Pat yourself dry with a clean towel. ?           10.  Wear clean pajamas. ?           11.  Place clean sheets on your bed the night of your first shower and do not  sleep with pets. ?Day of Surgery : ?Do not apply any lotions/deodorants the  morning of surgery.  Please wear clean clothes to the hospital/surgery center. ? ?FAILURE TO FOLLOW THESE INSTRUCTIONS MAY RESULT IN THE CANCELLATION OF YOUR SURGERY ?PATIENT SIGNATURE_________________________________ ? ?NURSE SIGNATURE__________________________________ ? ?________________________________________________________________________  ? ? ?           ?

## 2021-11-02 NOTE — Progress Notes (Addendum)
Anesthesia Review: ? ?PCP: Fredrich Romans, PA with [facility] ( sister reports just left practice last week)  ?Cardiologist : DR Gasper Sells LOV 10/31/21 ?Primary cardiologist- DR Fransico Him  ?Chest x-ray :04/06/21- [facility]  ?EKG : 02/26/21  ?Echo :08/03/21  ?Stress test: ?Cardiac Cath :  2021  ?Activity level:  wheelchair  ?Sleep Study/ CPAP : none  ?Fasting Blood Sugar :      / Checks Blood Sugar -- times a day:   ?Blood Thinner/ Instructions /Last Dose: ?ASA / Instructions/ Last Dose :   ?81 mg Aspirin and Plavix - stop 5 Days prior oer sister, Adrean  ?Pt had Cardiac arrest . MI - 2021 spent 5 months at [facility].  Was 10 minutes without oxygen per sister at preop appt.  PT is alert.  PT has speech issues and is unable to walk.  Wears diapers for incontinence.  Was at [facility] until 10.31/22 per sister.  PT has had not PT since.  Unable to obtain PT due to Medicaid per sister.  Does have [facility] m-f from 1-9.  LIves with Particia Nearing.  Has 2 sisters who are involved in his care, also,  Adrean, sister was with pt at preop with fiance in waitin area.  Fiance administers meds and was able to inform nurse what time he took meds.  PT is able to feed himself.  PEr pt and sister has good appetite.  PT currently in wheelchair.   ?Preop appt completed with sister, Adrean.   ?PT does not have HCPOA per sister.  NOte routed to cardiology DR Cghandrasekhar inquiring about possible order for PT initiated by cardiology.   ?

## 2021-11-06 ENCOUNTER — Other Ambulatory Visit: Payer: Self-pay

## 2021-11-06 ENCOUNTER — Encounter (HOSPITAL_COMMUNITY): Payer: Self-pay

## 2021-11-06 ENCOUNTER — Encounter (HOSPITAL_COMMUNITY)
Admission: RE | Admit: 2021-11-06 | Discharge: 2021-11-06 | Disposition: A | Discharge status: Home / Self Care | Payer: Medicaid Other | Source: Ambulatory Visit | Attending: Urology | Admitting: Urology

## 2021-11-06 VITALS — BP 112/70 | HR 79 | Temp 98.5°F | Resp 16 | Ht 72.0 in

## 2021-11-06 DIAGNOSIS — K219 Gastro-esophageal reflux disease without esophagitis: Secondary | ICD-10-CM | POA: Diagnosis not present

## 2021-11-06 DIAGNOSIS — I255 Ischemic cardiomyopathy: Secondary | ICD-10-CM | POA: Insufficient documentation

## 2021-11-06 DIAGNOSIS — Z01812 Encounter for preprocedural laboratory examination: Secondary | ICD-10-CM | POA: Insufficient documentation

## 2021-11-06 DIAGNOSIS — I110 Hypertensive heart disease with heart failure: Secondary | ICD-10-CM | POA: Insufficient documentation

## 2021-11-06 DIAGNOSIS — R972 Elevated prostate specific antigen [PSA]: Secondary | ICD-10-CM | POA: Diagnosis not present

## 2021-11-06 DIAGNOSIS — Z87891 Personal history of nicotine dependence: Secondary | ICD-10-CM | POA: Insufficient documentation

## 2021-11-06 DIAGNOSIS — Z8674 Personal history of sudden cardiac arrest: Secondary | ICD-10-CM | POA: Insufficient documentation

## 2021-11-06 DIAGNOSIS — I252 Old myocardial infarction: Secondary | ICD-10-CM | POA: Diagnosis not present

## 2021-11-06 DIAGNOSIS — Z01818 Encounter for other preprocedural examination: Secondary | ICD-10-CM

## 2021-11-06 HISTORY — DX: Gastro-esophageal reflux disease without esophagitis: K21.9

## 2021-11-06 HISTORY — DX: Acute myocardial infarction, unspecified: I21.9

## 2021-11-06 LAB — BASIC METABOLIC PANEL
Anion gap: 6 (ref 5–15)
BUN: 16 mg/dL (ref 6–20)
CO2: 27 mmol/L (ref 22–32)
Calcium: 9.3 mg/dL (ref 8.9–10.3)
Chloride: 108 mmol/L (ref 98–111)
Creatinine, Ser: 0.97 mg/dL (ref 0.61–1.24)
GFR, Estimated: >60 mL/min (ref >60)
Glucose, Bld: 94 mg/dL (ref 70–99)
Potassium: 4.2 mmol/L (ref 3.5–5.1)
Sodium: 141 mmol/L (ref 135–145)

## 2021-11-06 LAB — CBC
HCT: 38.7 % — ABNORMAL LOW (ref 39.0–52.0)
Hemoglobin: 12.9 g/dL — ABNORMAL LOW (ref 13.0–17.0)
MCH: 32 pg (ref 26.0–34.0)
MCHC: 33.3 g/dL (ref 30.0–36.0)
MCV: 96 fL (ref 80.0–100.0)
Platelets: 153 10*3/uL (ref 150–400)
RBC: 4.03 MIL/uL — ABNORMAL LOW (ref 4.22–5.81)
RDW: 12.3 % (ref 11.5–15.5)
WBC: 4.9 10*3/uL (ref 4.0–10.5)
nRBC: 0 % (ref 0.0–0.2)

## 2021-11-06 NOTE — Progress Notes (Signed)
Preop nurse saw pt and sister for preop appt on 11/06/21.  Preop nurse feels pt would benefit from Home PT or PT going to rehab facility for further PT.  PT has been home from [facility] since 06/18/21 with no PT.  Pt does have home nurse that comes in.  Sister, Kyra Searles states because he has Medicaid he is unable to get home or outside PT.  Is there anyway cardiology could order home PT for PT or outside PT ( family reports they would be able to take him to appts)?   ?Thanks for any help in this.   ?

## 2021-11-07 NOTE — Anesthesia Preprocedure Evaluation (Addendum)
Anesthesia Evaluation  ?Patient identified by MRN, date of birth, ID band ?Patient awake ? ? ? ?Reviewed: ?Allergy & Precautions, NPO status , Patient's Chart, lab work & pertinent test results ? ?Airway ?Mallampati: II ? ?TM Distance: >3 FB ?Neck ROM: Full ? ? ? Dental ?no notable dental hx. ? ?  ?Pulmonary ?neg pulmonary ROS, former smoker,  ?  ?Pulmonary exam normal ?breath sounds clear to auscultation ? ? ? ? ? ? Cardiovascular ?hypertension, + CAD, + Past MI and + Cardiac Stents  ?Normal cardiovascular exam ?Rhythm:Regular Rate:Normal ? ? ?  ?Neuro/Psych ?Developmental delay ?negative psych ROS  ? GI/Hepatic ?negative GI ROS, Neg liver ROS,   ?Endo/Other  ?negative endocrine ROS ? Renal/GU ?negative Renal ROS  ?negative genitourinary ?  ?Musculoskeletal ?negative musculoskeletal ROS ?(+)  ? Abdominal ?  ?Peds ?negative pediatric ROS ?(+)  Hematology ?negative hematology ROS ?(+)   ?Anesthesia Other Findings ? ? Reproductive/Obstetrics ?negative OB ROS ? ?  ? ? ? ? ? ? ? ? ? ? ? ? ? ?  ?  ? ? ? ? ? ? ?Anesthesia Physical ?Anesthesia Plan ? ?ASA: 2 ? ?Anesthesia Plan: General  ? ?Post-op Pain Management:   ? ?Induction: Intravenous ? ?PONV Risk Score and Plan: 2 and Ondansetron, Dexamethasone and Treatment may vary due to age or medical condition ? ?Airway Management Planned: LMA ? ?Additional Equipment:  ? ?Intra-op Plan:  ? ?Post-operative Plan: Extubation in OR ? ?Informed Consent: I have reviewed the patients History and Physical, chart, labs and discussed the procedure including the risks, benefits and alternatives for the proposed anesthesia with the patient or authorized representative who has indicated his/her understanding and acceptance.  ? ? ? ?Dental advisory given ? ?Plan Discussed with: CRNA and Surgeon ? ?Anesthesia Plan Comments: (See PAT note 11/06/2021, Konrad Felix Ward, PA-C)  ? ? ? ? ? ?Anesthesia Quick Evaluation ? ?

## 2021-11-07 NOTE — Progress Notes (Signed)
Anesthesia Chart Review ? ? Case: [id number] Date/Time: 11/13/21 1045  ? Procedure: BIOPSY TRANSRECTAL ULTRASONIC PROSTATE (TUBP) - 45 MINS FOR THIS CASE  ? Anesthesia type: Choice  ? Pre-op diagnosis: ELEVATED PROSTATE SPECIFIC ANTIGEN  ? Location: [facility] / [facility]  ? Surgeons: Vira Agar, MD  ? ?  ? ? ?DISCUSSION:61 y.o. former smoker with h/o HTN, GERD, CAD (STEMI and cardiac arrest 2021), ischemic cardiomyopathy, elevated PSA scheduled for above procedure 11/13/2021 with Dr. Terrilee Files.  ? ?Pt seen by cardiology 10/31/2021 for preoperative evaluation.  Per OV note, "Preoperative Risk Assessment ?- The Revised Cardiac Risk Index = 2  CAD, and CHF (now recovered) which equates to 6.6%: moderate estimated risk of perioperative myocardial infarction, pulmonary edema, ventricular fibrillation, cardiac arrest, or complete heart block.  ?- He is planned for a low risk procedure ( prostate biopsy) ?- we have discussed the cardiac risks with patient and family; it could be reasonable to proceed with low risk procedures without cardiac testing, if he were to need riskier procedure would recommend a lexiscan prior to surgery given his low functional status" ? ?Anticipate pt can proceed with planned procedure barring acute status change.   ?VS: BP 112/70   Pulse 79   Temp 36.9 ?C (Oral)   Resp 16   Ht 6' (1.829 m)   SpO2 99%   BMI 22.38 kg/m?  ? ?PROVIDERS: ?Fredrich Romans, PA is PCP  ? ?Primary Cardiologist: Fransico Him, MD ?LABS: Labs reviewed: Acceptable for surgery. ?(all labs ordered are listed, but only abnormal results are displayed) ? ?Labs Reviewed  ?CBC - Abnormal; Notable for the following components:  ?    Result Value  ? RBC 4.03 (*)   ? Hemoglobin 12.9 (*)   ? HCT 38.7 (*)   ? All other components within normal limits  ?BASIC METABOLIC PANEL  ? ? ? ?IMAGES: ? ? ?EKG: ?10/31/2021 ?Rate 74 bpm  ? ?CV: ?Echo 08/06/2021 ?1. Left ventricular ejection fraction, by estimation, is 55 to 60%. Left   ?ventricular ejection fraction by PLAX is 58 %. The left ventricle has  ?normal function. The left ventricle has no regional wall motion  ?abnormalities. There is mild left ventricular  ?hypertrophy. Left ventricular diastolic parameters are consistent with  ?Grade I diastolic dysfunction (impaired relaxation).  ? 2. Right ventricular systolic function is normal. The right ventricular  ?size is normal.  ? 3. The mitral valve is normal in structure. Trivial mitral valve  ?regurgitation. No evidence of mitral stenosis.  ? 4. The aortic valve is tricuspid. Aortic valve regurgitation is not  ?visualized. No aortic stenosis is present.  ? 5. The inferior vena cava is normal in size with greater than 50%  ?respiratory variability, suggesting right atrial pressure of 3 mmHg.  ?Past Medical History:  ?Diagnosis Date  ? CAD (coronary artery disease), native coronary artery 07/2020  ? s/p cardiac arrest in setting of STEMI with cath showing 40%pRCA and 95% mLCx s/p PCI  ? Family history of adverse reaction to anesthesia   ? sister has had a hard time waking after surgery  ? GERD (gastroesophageal reflux disease)   ? History of kidney stones   ? HLD (hyperlipidemia)   ? Hypertension   ? Ischemic cardiomyopathy 07/2020  ? EF 40% at time of MI  ? Myocardial infarction [facility])   ? ? ?Past Surgical History:  ?Procedure Laterality Date  ? CORONARY/GRAFT ACUTE MI REVASCULARIZATION N/A 07/22/2020  ? Procedure: Coronary/Graft Acute  MI Revascularization;  Surgeon: Lorretta Harp, MD;  Location: [place] [facility];  Service: Cardiovascular;  Laterality: N/A;  ? IR GASTROSTOMY TUBE MOD SED  08/17/2020  ? IR GASTROSTOMY TUBE REMOVAL  11/08/2020  ? LEFT HEART CATH AND CORONARY ANGIOGRAPHY N/A 07/22/2020  ? Procedure: LEFT HEART CATH AND CORONARY ANGIOGRAPHY;  Surgeon: Lorretta Harp, MD;  Location: [place] [facility];  Service: Cardiovascular;  Laterality: N/A;  ? RADIOLOGY WITH ANESTHESIA N/A 10/16/2021  ? Procedure: MRI WITH ANESTHESIA  OF PROSTATE WITH AND WITHOUT CONTRAST;  Surgeon: Radiologist, Medication, MD;  Location: [place];  Service: Radiology;  Laterality: N/A;  ? ? ?MEDICATIONS: ? acetaminophen (TYLENOL) 325 MG tablet  ? aspirin 81 MG chewable tablet  ? atorvastatin (LIPITOR) 80 MG tablet  ? clopidogrel (PLAVIX) 75 MG tablet  ? isosorbide mononitrate (IMDUR) 30 MG 24 hr tablet  ? losartan (COZAAR) 25 MG tablet  ? metoprolol succinate (TOPROL-XL) 25 MG 24 hr tablet  ? polyethylene glycol (MIRALAX / GLYCOLAX) 17 g packet  ? Vitamin D, Ergocalciferol, (DRISDOL) 1.25 MG (50000 UNIT) CAPS capsule  ? ?No current facility-administered medications for this encounter.  ? ? sodium phosphate (FLEET) 7-19 GM/118ML enema 1 enema  ? ?Konrad Felix Ward, PA-C ?[facility] ?[phone] ? ? ? ? ? ? ? ?

## 2021-11-13 ENCOUNTER — Ambulatory Visit (HOSPITAL_COMMUNITY): Payer: Medicaid Other

## 2021-11-13 ENCOUNTER — Ambulatory Visit (HOSPITAL_BASED_OUTPATIENT_CLINIC_OR_DEPARTMENT_OTHER): Payer: Medicaid Other | Admitting: Anesthesiology

## 2021-11-13 ENCOUNTER — Ambulatory Visit (HOSPITAL_COMMUNITY): Payer: Medicaid Other | Admitting: Physician Assistant

## 2021-11-13 ENCOUNTER — Encounter (HOSPITAL_COMMUNITY): Payer: Self-pay | Admitting: Urology

## 2021-11-13 ENCOUNTER — Encounter (HOSPITAL_COMMUNITY)
Admission: RE | Disposition: A | Discharge status: Home / Self Care | Payer: Self-pay | Source: Home / Self Care | Attending: Urology

## 2021-11-13 ENCOUNTER — Ambulatory Visit (HOSPITAL_COMMUNITY)
Admission: RE | Admit: 2021-11-13 | Discharge: 2021-11-13 | Disposition: A | Discharge status: Home / Self Care | Payer: Medicaid Other | Attending: Urology | Admitting: Urology

## 2021-11-13 ENCOUNTER — Other Ambulatory Visit (HOSPITAL_COMMUNITY): Payer: Medicaid Other

## 2021-11-13 DIAGNOSIS — R972 Elevated prostate specific antigen [PSA]: Secondary | ICD-10-CM | POA: Diagnosis present

## 2021-11-13 DIAGNOSIS — Z8674 Personal history of sudden cardiac arrest: Secondary | ICD-10-CM | POA: Diagnosis not present

## 2021-11-13 DIAGNOSIS — Z87891 Personal history of nicotine dependence: Secondary | ICD-10-CM | POA: Insufficient documentation

## 2021-11-13 DIAGNOSIS — C61 Malignant neoplasm of prostate: Secondary | ICD-10-CM | POA: Diagnosis not present

## 2021-11-13 DIAGNOSIS — Z7982 Long term (current) use of aspirin: Secondary | ICD-10-CM | POA: Insufficient documentation

## 2021-11-13 DIAGNOSIS — Z955 Presence of coronary angioplasty implant and graft: Secondary | ICD-10-CM | POA: Diagnosis not present

## 2021-11-13 DIAGNOSIS — I10 Essential (primary) hypertension: Secondary | ICD-10-CM | POA: Insufficient documentation

## 2021-11-13 DIAGNOSIS — I252 Old myocardial infarction: Secondary | ICD-10-CM | POA: Diagnosis not present

## 2021-11-13 DIAGNOSIS — Z7901 Long term (current) use of anticoagulants: Secondary | ICD-10-CM | POA: Diagnosis not present

## 2021-11-13 DIAGNOSIS — N4289 Other specified disorders of prostate: Secondary | ICD-10-CM | POA: Diagnosis not present

## 2021-11-13 DIAGNOSIS — I2510 Atherosclerotic heart disease of native coronary artery without angina pectoris: Secondary | ICD-10-CM | POA: Insufficient documentation

## 2021-11-13 DIAGNOSIS — Z993 Dependence on wheelchair: Secondary | ICD-10-CM | POA: Diagnosis not present

## 2021-11-13 DIAGNOSIS — I255 Ischemic cardiomyopathy: Secondary | ICD-10-CM | POA: Diagnosis not present

## 2021-11-13 HISTORY — PX: PROSTATE BIOPSY: SHX241

## 2021-11-13 SURGERY — BIOPSY, PROSTATE, RECTAL APPROACH, WITH US GUIDANCE
Anesthesia: General

## 2021-11-13 MED ORDER — LACTATED RINGERS IV SOLN: INTRAVENOUS | Status: DC

## 2021-11-13 MED ORDER — ONDANSETRON HCL 4 MG/2ML IJ SOLN
INTRAMUSCULAR | Status: AC
  Filled 2021-11-13: qty 2

## 2021-11-13 MED ORDER — BUPIVACAINE HCL 0.25 % IJ SOLN
INTRAMUSCULAR | Status: AC
  Filled 2021-11-13: qty 1

## 2021-11-13 MED ORDER — EPHEDRINE 5 MG/ML INJ
INTRAVENOUS | Status: AC
  Filled 2021-11-13: qty 5

## 2021-11-13 MED ORDER — LIDOCAINE HCL (PF) 2 % IJ SOLN
INTRAMUSCULAR | Status: AC
  Filled 2021-11-13: qty 5

## 2021-11-13 MED ORDER — DEXAMETHASONE SODIUM PHOSPHATE 10 MG/ML IJ SOLN
INTRAMUSCULAR | Status: DC | PRN
  Administered 2021-11-13: 10 mg via INTRAVENOUS

## 2021-11-13 MED ORDER — ONDANSETRON HCL 4 MG/2ML IJ SOLN
INTRAMUSCULAR | Status: DC | PRN
Start: 2021-11-13 — End: 2021-11-13
  Administered 2021-11-13: 4 mg via INTRAVENOUS

## 2021-11-13 MED ORDER — EPHEDRINE SULFATE-NACL 50-0.9 MG/10ML-% IV SOSY
PREFILLED_SYRINGE | INTRAVENOUS | Status: DC | PRN
  Administered 2021-11-13: 15 mg via INTRAVENOUS
  Administered 2021-11-13 (×2): 10 mg via INTRAVENOUS

## 2021-11-13 MED ORDER — SODIUM CHLORIDE 0.9 % IV SOLN
2.0000 g | INTRAVENOUS | Status: AC
  Administered 2021-11-13: 2 g via INTRAVENOUS
  Filled 2021-11-13: qty 20

## 2021-11-13 MED ORDER — ORAL CARE MOUTH RINSE: 15.0000 mL | Freq: Once | OROMUCOSAL | Status: AC

## 2021-11-13 MED ORDER — CHLORHEXIDINE GLUCONATE 0.12 % MT SOLN
15.0000 mL | Freq: Once | OROMUCOSAL | Status: AC
  Administered 2021-11-13: 15 mL via OROMUCOSAL

## 2021-11-13 MED ORDER — ONDANSETRON HCL 4 MG/2ML IJ SOLN: 4.0000 mg | Freq: Once | INTRAMUSCULAR | Status: DC | PRN

## 2021-11-13 MED ORDER — FENTANYL CITRATE PF 50 MCG/ML IJ SOSY: 25.0000 ug | PREFILLED_SYRINGE | INTRAMUSCULAR | Status: DC | PRN

## 2021-11-13 MED ORDER — MIDAZOLAM HCL 2 MG/2ML IJ SOLN
INTRAMUSCULAR | Status: AC
  Filled 2021-11-13: qty 2

## 2021-11-13 MED ORDER — DEXAMETHASONE SODIUM PHOSPHATE 10 MG/ML IJ SOLN
INTRAMUSCULAR | Status: AC
  Filled 2021-11-13: qty 1

## 2021-11-13 MED ORDER — BUPIVACAINE HCL (PF) 0.25 % IJ SOLN
INTRAMUSCULAR | Status: DC | PRN
  Administered 2021-11-13: 10 mL

## 2021-11-13 MED ORDER — FENTANYL CITRATE (PF) 100 MCG/2ML IJ SOLN
INTRAMUSCULAR | Status: DC | PRN
  Administered 2021-11-13: 50 ug via INTRAVENOUS

## 2021-11-13 MED ORDER — FENTANYL CITRATE (PF) 100 MCG/2ML IJ SOLN
INTRAMUSCULAR | Status: AC
  Filled 2021-11-13: qty 2

## 2021-11-13 MED ORDER — PROPOFOL 10 MG/ML IV BOLUS
INTRAVENOUS | Status: AC
  Filled 2021-11-13: qty 20

## 2021-11-13 MED ORDER — PROPOFOL 10 MG/ML IV BOLUS
INTRAVENOUS | Status: DC | PRN
  Administered 2021-11-13: 180 mg via INTRAVENOUS

## 2021-11-13 MED ORDER — LIDOCAINE HCL (CARDIAC) PF 100 MG/5ML IV SOSY
PREFILLED_SYRINGE | INTRAVENOUS | Status: DC | PRN
Start: 2021-11-13 — End: 2021-11-13
  Administered 2021-11-13: 100 mg via INTRAVENOUS

## 2021-11-13 SURGICAL SUPPLY — 7 items
INST BIOPSY MAXCORE 18GX25 (NEEDLE) ×1 IMPLANT
INSTR BIOPSY MAXCORE 18GX20 (NEEDLE) IMPLANT
NDL SAFETY ECLIPSE 18X1.5 (NEEDLE) IMPLANT
NEEDLE HYPO 18GX1.5 SHARP (NEEDLE) ×2
SYR CONTROL 10ML LL (SYRINGE) ×1 IMPLANT
UNDERPAD 30X36 HEAVY ABSORB (UNDERPADS AND DIAPERS) ×4 IMPLANT
WATER STERILE IRR 500ML POUR (IV SOLUTION) ×1 IMPLANT

## 2021-11-13 NOTE — Anesthesia Procedure Notes (Signed)
Procedure Name: LMA Insertion ?Date/Time: 11/13/2021 11:51 AM ?Performed by: Lind Covert, CRNA ?Pre-anesthesia Checklist: Patient identified, Emergency Drugs available, Suction available, Patient being monitored and Timeout performed ?Patient Re-evaluated:Patient Re-evaluated prior to induction ?Oxygen Delivery Method: Circle system utilized ?Preoxygenation: Pre-oxygenation with 100% oxygen ?Induction Type: IV induction ?LMA: LMA inserted ?LMA Size: 5.0 ?Tube type: Oral ?Number of attempts: 1 ?Placement Confirmation: positive ETCO2 ?Tube secured with: Tape ?Dental Injury: Teeth and Oropharynx as per pre-operative assessment  ? ? ? ? ?

## 2021-11-13 NOTE — Op Note (Signed)
Preoperative diagnosis: 1. elevated PSA, PIRADS 5 MRI ? ?Postoperative diagnosis: same ? ?Procedure: ?1. Transrectal ultrasound of the prostate ?2. Prostate needle biopsy ? ?Surgeon: Donald Pore MD ? ?Anesthesia: IV sedation ? ?Complications: None ? ?EBL: Minimal ? ?Specimens: Specimens were obtained from the lateral and parasagittal regions of the base, mid, and apex of the prostate bilaterally. An additional 2 were taken targeting the L prostate capsule/SV. A total of 14 biopsy cores were obtained. ? ?Disposition specimens: Pathology lab. ? ?Indication: Antonio Navarro is a 61 y.o. with a history of elevated PSA . An MRI was obtained and demonstrated a PIRADS 5 lesion occupying most of the PZ with some area on the left concerning for SV invasion. The patient and his family elected to proceed with biopsy.  ? ?Description of procedure: ? ?The patient was taken to the operating room and conscious sedation was administered. He had been provided preoperative antibiotics and did prepare himself with an enema prior to his procedure. He was laid in the left lateral decubitus position. The transrectal ultrasound probe was then placed into the rectum and used to visualize the prostate. See pictures in PACs. 10 cc of .5% marcaine was then utilized to provide local anesthesia with a periprostatic nerve block. Images of the prostate were then obtained systematically. The prostate volume was measured at 28 cc. Systematic biopsies of the prostate were then performed under ultrasound guidance. A total of 14 cores were obtained. Biopsies were taken from the lateral and parasagittal regions of the base, mid, and apex of the prostate bilaterally. I additionally took two samples targeting the left SZ/prostate capsule. Following removal of the transrectal ultrasound, a digital rectal exam was performed and it was confirmed that no significant bleeding was present. The patient tolerated the procedure well and without  complications. ? ?PLAN: ?F/u as scheduled to review results. ? ?

## 2021-11-13 NOTE — Anesthesia Postprocedure Evaluation (Signed)
Anesthesia Post Note ? ?Patient: Antonio Navarro ? ?Procedure(s) Performed: BIOPSY TRANSRECTAL ULTRASONIC PROSTATE (TUBP) ? ?  ? ?Patient location during evaluation: PACU ?Anesthesia Type: General ?Level of consciousness: awake and alert ?Pain management: pain level controlled ?Vital Signs Assessment: post-procedure vital signs reviewed and stable ?Respiratory status: spontaneous breathing, nonlabored ventilation, respiratory function stable and patient connected to nasal cannula oxygen ?Cardiovascular status: blood pressure returned to baseline and stable ?Postop Assessment: no apparent nausea or vomiting ?Anesthetic complications: no ? ? ?No notable events documented. ? ?Last Vitals:  ?Vitals:  ? 11/13/21 1250 11/13/21 1300  ?BP:  (!) 150/89  ?Pulse: 83 92  ?Resp: 11 20  ?Temp:  36.6 ?C  ?SpO2: 98% 97%  ?  ?Last Pain:  ?Vitals:  ? 11/13/21 1300  ?TempSrc:   ?PainSc: 0-No pain  ? ? ?  ?  ?  ?  ?  ?  ? ?Davied Nocito S ? ? ? ? ?

## 2021-11-13 NOTE — Discharge Instructions (Addendum)
You should avoid strenuous activities today but may resume all normal activities tomorrow. ? ?You can take Tylenol or Advil as needed for any pain or discomfort. ? ?You can resume your plavix on 11/15/2021 ? ?

## 2021-11-13 NOTE — Interval H&P Note (Signed)
History and Physical Interval Note: ? ?11/13/2021 ?9:24 AM ? ?Antonio Navarro  has presented today for surgery, with the diagnosis of [facility].  The various methods of treatment have been discussed with the patient and family. After consideration of risks, benefits and other options for treatment, the patient has consented to  Procedure(s) with comments: ?BIOPSY TRANSRECTAL ULTRASONIC PROSTATE (TUBP) (N/A) - 45 MINS FOR THIS CASE as a surgical intervention.  The patient's history has been reviewed, patient examined, no change in status, stable for surgery.  I have reviewed the patient's chart and labs.  Questions were answered to the patient's satisfaction.   ? ? ?Octa ? ? ?

## 2021-11-13 NOTE — Transfer of Care (Signed)
Immediate Anesthesia Transfer of Care Note ? ?Patient: Antonio Navarro ? ?Procedure(s) Performed: BIOPSY TRANSRECTAL ULTRASONIC PROSTATE (TUBP) ? ?Patient Location: PACU ? ?Anesthesia Type:General ? ?Level of Consciousness: sedated ? ?Airway & Oxygen Therapy: Patient Spontanous Breathing and Patient connected to face mask oxygen ? ?Post-op Assessment: Report given to RN and Post -op Vital signs reviewed and stable ? ?Post vital signs: Reviewed and stable ? ?Last Vitals:  ?Vitals Value Taken Time  ?BP    ?Temp    ?Pulse 68 11/13/21 1231  ?Resp 12 11/13/21 1231  ?SpO2 100 % 11/13/21 1231  ?Vitals shown include unvalidated device data. ? ?Last Pain:  ?Vitals:  ? 11/13/21 0926  ?TempSrc:   ?PainSc: 0-No pain  ?   ? ?Patients Stated Pain Goal: 3 (11/13/21 6116) ? ?Complications: No notable events documented. ?

## 2021-11-14 ENCOUNTER — Encounter (HOSPITAL_COMMUNITY): Payer: Self-pay | Admitting: Urology

## 2021-11-14 LAB — SURGICAL PATHOLOGY

## 2021-11-29 ENCOUNTER — Other Ambulatory Visit (HOSPITAL_COMMUNITY): Payer: Self-pay | Admitting: Urology

## 2021-11-29 DIAGNOSIS — C61 Malignant neoplasm of prostate: Secondary | ICD-10-CM

## 2021-12-03 ENCOUNTER — Telehealth: Payer: Self-pay | Admitting: Radiation Oncology

## 2021-12-03 NOTE — Telephone Encounter (Signed)
LVM to schedule consult with Dr. Manning.  ?

## 2021-12-03 NOTE — Telephone Encounter (Signed)
LVM for pt after missed call to schedule appt ?

## 2021-12-12 ENCOUNTER — Inpatient Hospital Stay (HOSPITAL_COMMUNITY): Admission: RE | Admit: 2021-12-12 | Payer: Medicaid Other | Source: Ambulatory Visit

## 2021-12-12 NOTE — Progress Notes (Addendum)
GU Location of Tumor / Histology: Prostate Ca ? ?If Prostate Cancer, Gleason Score is (5 + 4) and PSA is (10.81 as of 06/2021) ? ?Biopsies: ?Dr. Cain Sieve ? ? ? ? ? ? ? ?Past/Anticipated interventions by urology, if any:  ? ? ? ?Past/Anticipated interventions by medical oncology, if any: NA ? ?Dose of Decadron, if applicable: No ? ?Recent neurologic symptoms, if any:  ?Seizures: No ?Headaches: No ?Dizziness/ataxia: No ?Difficulty with hand coordination: Yes ?Focal numbness/weakness: Yes ?Visual deficits/changes: No ?Confusion/Memory deficits: Yes ? ? ?Weight changes, if any:  No ? ?IPSS:  0 ?SHIM:  1 ? ?Bowel/Bladder complaints, if any:  No ? ?Nausea/Vomiting, if any: No ? ?Pain issues, if any:  0/10 ? ?SAFETY ISSUES: ?Prior radiation?  No ?Pacemaker/ICD? No, ?Possible current pregnancy? Male ?Is the patient on methotrexate? No ? ?Current Complaints / other details:  Need more information on treatment options. ?

## 2021-12-13 ENCOUNTER — Encounter (HOSPITAL_COMMUNITY)
Admission: RE | Admit: 2021-12-13 | Discharge: 2021-12-13 | Disposition: A | Discharge status: Home / Self Care | Payer: Medicaid Other | Source: Ambulatory Visit | Attending: Urology | Admitting: Urology

## 2021-12-13 DIAGNOSIS — N200 Calculus of kidney: Secondary | ICD-10-CM | POA: Insufficient documentation

## 2021-12-13 DIAGNOSIS — C775 Secondary and unspecified malignant neoplasm of intrapelvic lymph nodes: Secondary | ICD-10-CM | POA: Insufficient documentation

## 2021-12-13 DIAGNOSIS — C61 Malignant neoplasm of prostate: Secondary | ICD-10-CM | POA: Diagnosis present

## 2021-12-13 DIAGNOSIS — I700 Atherosclerosis of aorta: Secondary | ICD-10-CM | POA: Diagnosis not present

## 2021-12-13 IMAGING — CT NM PET TUM IMG SKULL BASE T - THIGH
7 series · 25 of 25 positions shown · non-contrast
Comparison: None.

CLINICAL DATA: Prostate carcinoma with biochemical recurrence.

EXAM:
NUCLEAR MEDICINE PET SKULL BASE TO THIGH
TECHNIQUE: 9.28 mCi F18 Piflufolastat (Pylarify) was injected intravenously.
Full-ring PET imaging was performed from the skull base to thigh
after the radiotracer. CT data was obtained and used for attenuation
correction and anatomic localization.

[Series 3: pet sk_thigh ac · axial · 5.0mm · 4.07mm/px · z∈[-656,+324]mm · 6 of 246 slices shown]
[im 1/246]
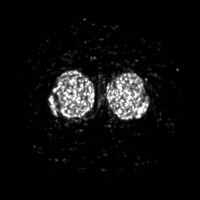
[im 50/246]
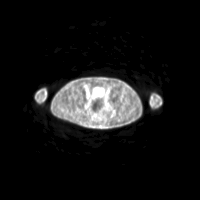
[im 99/246]
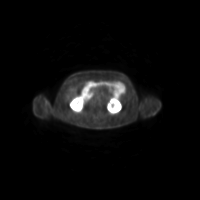
[im 148/246]
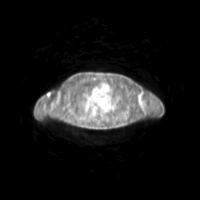
[im 197/246]
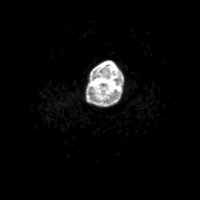
[im 246/246]
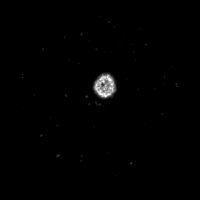

[Series 4: ct sk_thigh 5.0 br38 · axial · 5.0mm · 0.98mm/px · z∈[-656,+324]mm · 5 of 246 slices shown]
[im 1/246]
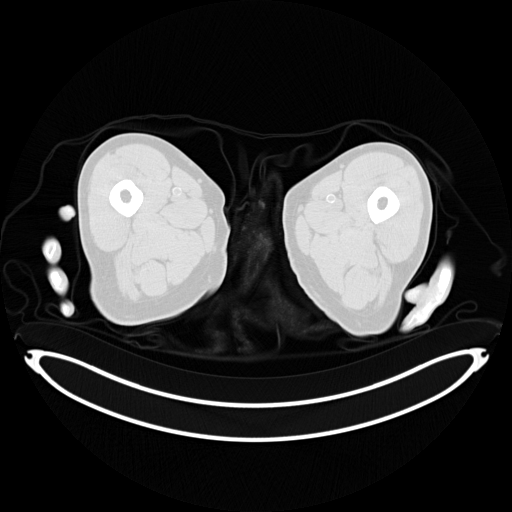
[im 62/246]
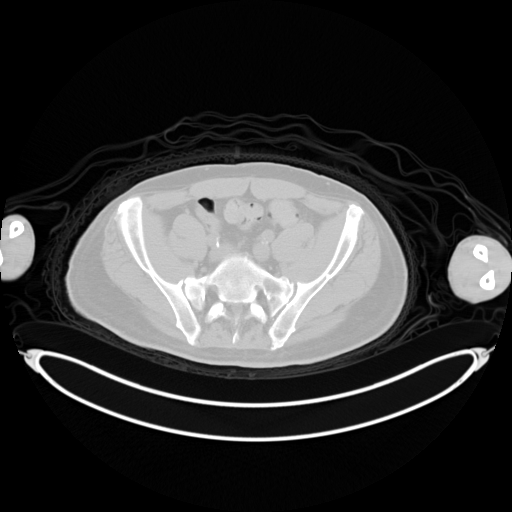
[im 123/246]
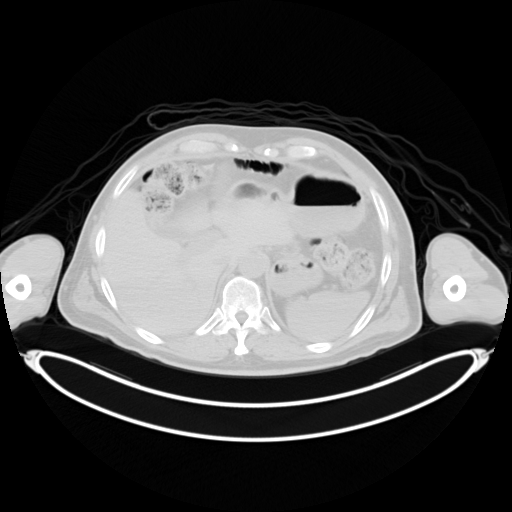
[im 184/246]
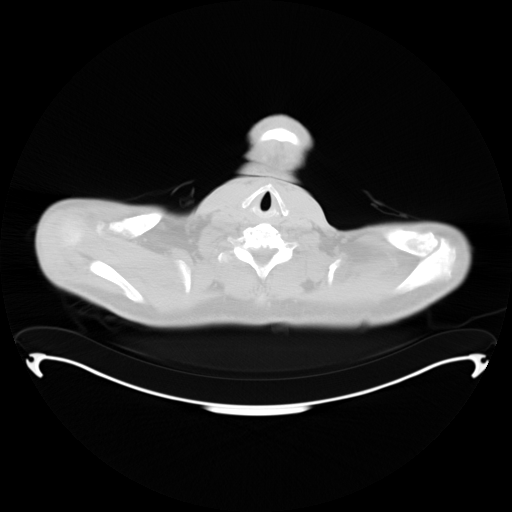
[im 246/246]
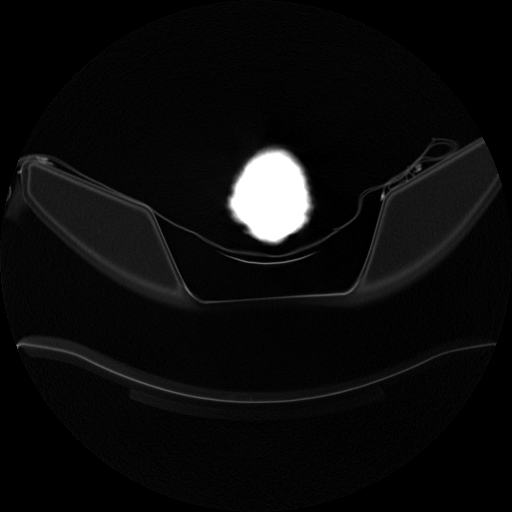

[Series 5: pet sk_thigh nac · axial · 5.0mm · 4.07mm/px · z∈[-656,+324]mm · 5 of 246 slices shown]
[im 1/246]
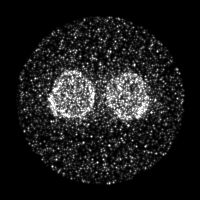
[im 62/246]
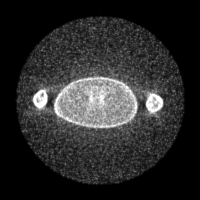
[im 123/246]
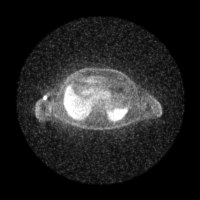
[im 184/246]
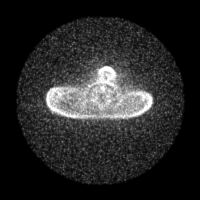
[im 246/246]
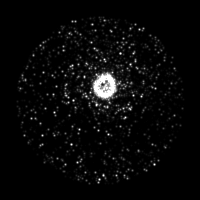

[Series 7: ct 5.0 bl57 lung_bone · axial · 5.0mm · 0.64mm/px · z∈[-232,+80]mm · 2 of 79 slices shown]
[im 1/79]
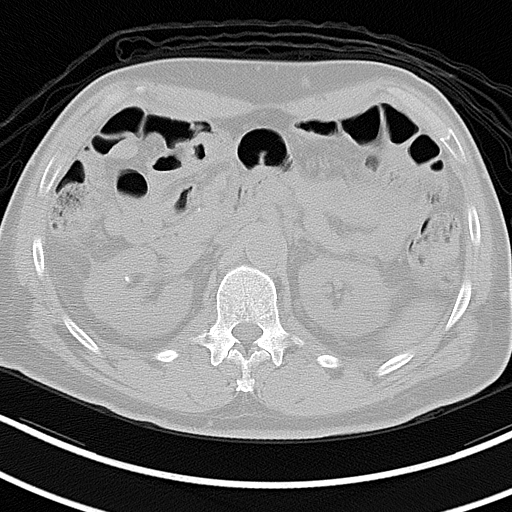
[im 79/79]
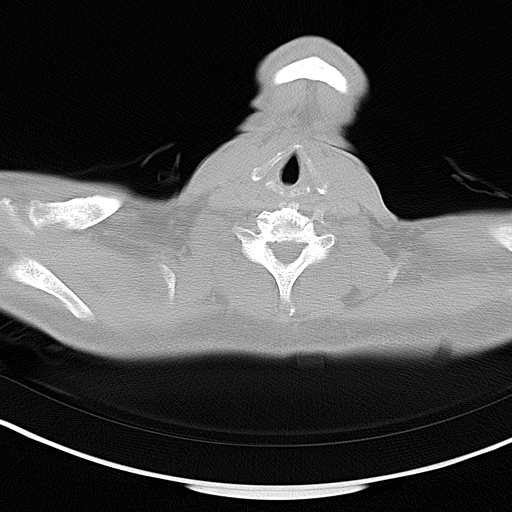

[Series 604: fused tra · 5 of 245 slices shown]
[im 1/245]
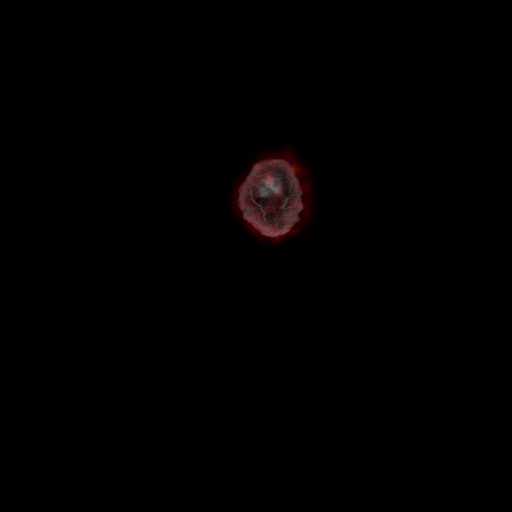
[im 62/245]
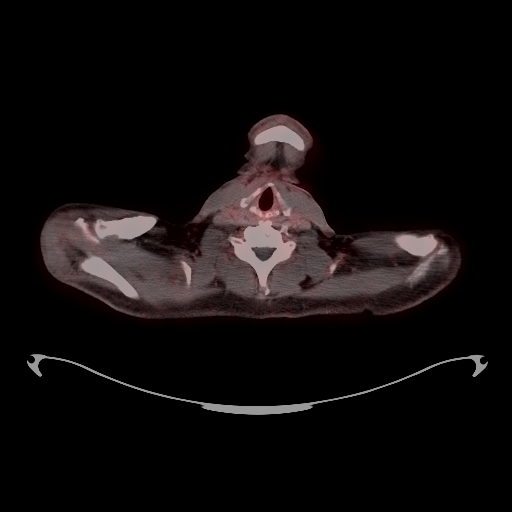
[im 123/245]
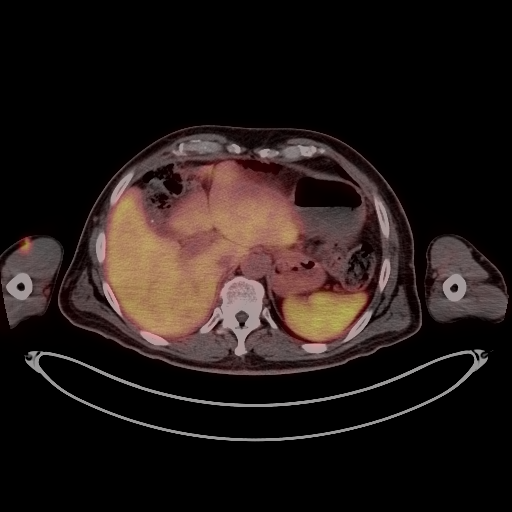
[im 184/245]
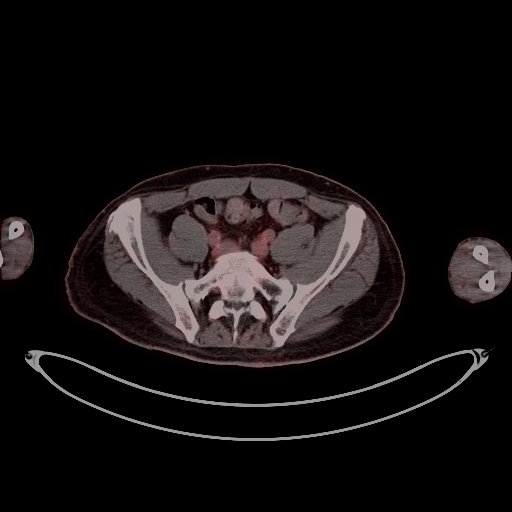
[im 245/245]
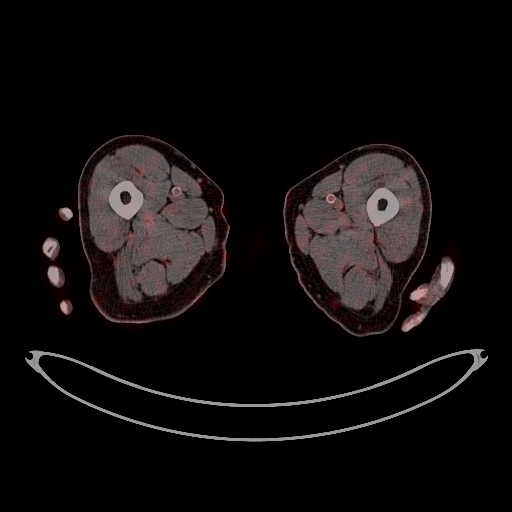

[Series 605: fused cor · 1 of 62 slices shown]
[im 1/62]
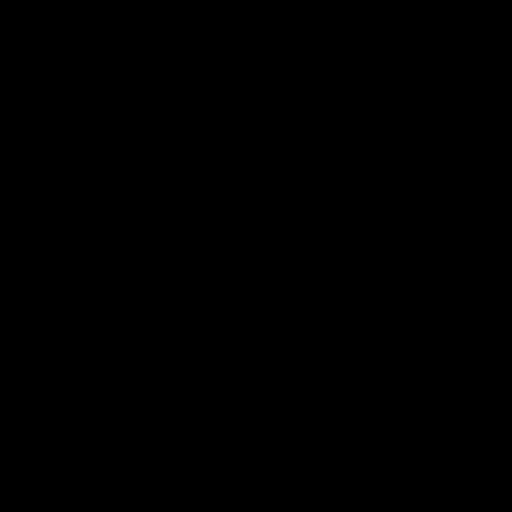

[Series 606: mip pet · coronal · 2.03mm/px · 1 of 32 slices shown]
[im 1/32]
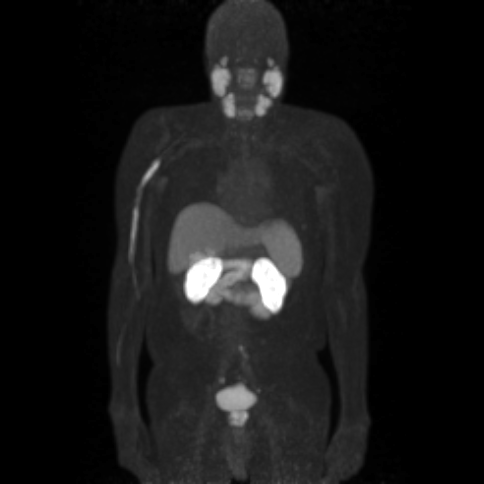

[25 of 25 positions shown; findings below may reference images not displayed]

FINDINGS: NECK

No radiotracer activity in neck lymph nodes.

Incidental CT finding: None

CHEST

No radiotracer accumulation within mediastinal or hilar lymph nodes.
No suspicious pulmonary nodules on the CT scan.

Incidental CT finding: None

ABDOMEN/PELVIS

Prostate: Intense radiotracer activity involving the near entirety
of the prostate gland with SUV max equal 13.7.

Lymph nodes: Minimally enlarged but intensely radiotracer avid
pelvic lymph nodes are present.

For example LEFT external iliac node adjacent to the operator fossa
measures 5 mm (image 199) with SUV max equal 4.4.

LEFT common iliac node measuring 5 mm (image 177) with SUV max equal
6.1.

RIGHT external iliac node measuring 5 mm (image 194) SUV max equal
5.4.

Lymph node in the RIGHT mesorectum adjacent to the RIGHT seminal
vesicle with SUV max equal 7.1 (image 207).

No periaortic retroperitoneal radiotracer avid nodes.

Liver: No evidence of liver metastasis

Incidental CT finding: Bilateral nonobstructing renal calculi.
Atherosclerotic calcification of the aorta.

SKELETON

No focal  activity to suggest skeletal metastasis.
IMPRESSION: 1. Intense radiotracer activity involving the near entirety of the
prostate gland consistent with high-grade prostate carcinoma.
2. Prostate cancer nodal metastasis to the bilateral iliac nodal
stations as well as the mesorectal sheath.
3. No evidence of metastatic disease outside the pelvis. No visceral
metastasis or skeletal metastasis

## 2021-12-13 MED ORDER — PIFLIFOLASTAT F 18 (PYLARIFY) INJECTION
9.0000 | Freq: Once | INTRAVENOUS | Status: AC
  Administered 2021-12-13: 9.28 via INTRAVENOUS

## 2021-12-16 DIAGNOSIS — C61 Malignant neoplasm of prostate: Secondary | ICD-10-CM | POA: Insufficient documentation

## 2021-12-16 NOTE — Progress Notes (Signed)
?Radiation Oncology         [phone] ?________________________________ ? ?Initial Outpatient Consultation ? ?Name: Antonio Navarro MRN: [id number]  ?Date: 12/17/2021  DOB: 1960-12-13 ? ?XI:PJASNK, [place], [place]  Vira Agar, MD  ? ?REFERRING PHYSICIAN: Vira Agar, MD ? ?DIAGNOSIS: 61 y.o. 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 ? ?  ICD-10-CM   ?1. Malignant neoplasm of prostate ([place])  C61   ?  ? ? ?HISTORY OF PRESENT ILLNESS: Antonio Navarro is a 61 y.o. male with a diagnosis of prostate cancer. 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 He was noted to have an elevated PSA of 10.81 by his primary care provider, Fredrich Romans, [place], in November 2022.  Accordingly, he was referred for evaluation in urology by Dr. Phillip Heal L. Machen on 08/17/21. He was subsequently referred for MRI of the Prostate which revealed a PIRADS 5 lesion in the PZ bilaterally as well as concern for SV invasion and capsular contact as well as an enlarged L perirectal LN. No bone metastases noted. Prostate noted to be 29 cc on MRI. Due to the suspicious findings on MRI, the patient proceeded to transrectal ultrasound with 12 biopsies of the prostate on 11/13/21.  Out of 12 core biopsies, 12 were positive. The maximum Gleason score was 5+4=9, and this was seen in 6 of the cores throughout the prostate. He was subsequently referred for PSMA PET which he underwent on 12/13/21 and which revealed intense activity of almost the entirety of the prostate gland, consistent with known prostate cancer as well as nodal mets to the bilateral iliac nodal stations and the mesorectal sheath. No metastatic disease outside the pelvis noted. No visceral or bony metastases noted. ? ?The patient reviewed the biopsy results with his urologist and he has kindly been referred today for discussion of  potential radiation treatment options. ? ? ?PREVIOUS RADIATION THERAPY: No ? ?PAST MEDICAL HISTORY:  ?Past Medical History:  ?Diagnosis Date  ? CAD (coronary artery disease), native coronary artery 07/2020  ? s/p cardiac arrest in setting of STEMI with cath showing 40%pRCA and 95% mLCx s/p PCI  ? Family history of adverse reaction to anesthesia   ? sister has had a hard time waking after surgery  ? GERD (gastroesophageal reflux disease)   ? History of kidney stones   ? HLD (hyperlipidemia)   ? Hypertension   ? Ischemic cardiomyopathy 07/2020  ? EF 40% at time of MI  ? Myocardial infarction [facility])   ?   ? ?PAST SURGICAL HISTORY: ?Past Surgical History:  ?Procedure Laterality Date  ? CORONARY/GRAFT ACUTE MI REVASCULARIZATION N/A 07/22/2020  ? Procedure: Coronary/Graft Acute MI Revascularization;  Surgeon: Lorretta Harp, MD;  Location: [place] [facility];  Service: Cardiovascular;  Laterality: N/A;  ? IR GASTROSTOMY TUBE MOD SED  08/17/2020  ? IR GASTROSTOMY TUBE REMOVAL  11/08/2020  ? LEFT HEART CATH AND CORONARY ANGIOGRAPHY N/A 07/22/2020  ? Procedure: LEFT HEART CATH AND CORONARY ANGIOGRAPHY;  Surgeon: Lorretta Harp, MD;  Location: [place] [facility];  Service: Cardiovascular;  Laterality: N/A;  ? PROSTATE BIOPSY N/A 11/13/2021  ? Procedure: BIOPSY TRANSRECTAL ULTRASONIC PROSTATE (TUBP);  Surgeon: Vira Agar, MD;  [facility];  Service: Urology;  Laterality: N/A;  [place] THIS CASE  ? RADIOLOGY WITH ANESTHESIA N/A 10/16/2021  ?  Procedure: MRI WITH ANESTHESIA OF PROSTATE WITH AND WITHOUT CONTRAST;  Surgeon: Radiologist, Medication, MD;  Location: [place];  Service: Radiology;  Laterality: N/A;  ? ? ?FAMILY HISTORY:  ?Family History  ?Problem Relation Age of Onset  ? Cancer Mother   ? Hypertension Father   ? ? ?SOCIAL HISTORY:  ?Social History  ? ?Socioeconomic History  ? Marital status: Single  ?  Spouse name: Not on file  ? Number of children: Not on file  ? Years of education: Not on file  ? Highest  education level: Not on file  ?Occupational History  ? Not on file  ?Tobacco Use  ? Smoking status: Former  ?  Packs/day: 1.00  ?  Types: Cigarettes  ? Smokeless tobacco: Never  ?Vaping Use  ? Vaping Use: Never used  ?Substance and Sexual Activity  ? Alcohol use: Not Currently  ?  Comment: occasional  ? Drug use: Not Currently  ?  Types: Marijuana  ?  Comment: occ  ? Sexual activity: Not on file  ?Other Topics Concern  ? Not on file  ?Social History Narrative  ? Not on file  ? ?Social Determinants of Health  ? ?Financial Resource Strain: Not on file  ?Food Insecurity: Not on file  ?Transportation Needs: Not on file  ?Physical Activity: Not on file  ?Stress: Not on file  ?Social Connections: Not on file  ?Intimate Partner Violence: Not on file  ? ? ?ALLERGIES: Patient has no known allergies. ? ?MEDICATIONS:  ?Current Outpatient Medications  ?Medication Sig Dispense Refill  ? acetaminophen (TYLENOL) 325 MG tablet Take 2 tablets (650 mg total) by mouth every 6 (six) hours as needed for mild pain, fever or headache.    ? aspirin 81 MG chewable tablet Chew 1 tablet (81 mg total) by mouth daily.    ? atorvastatin (LIPITOR) 80 MG tablet Take 1 tablet (80 mg total) by mouth daily. 90 tablet 3  ? clopidogrel (PLAVIX) 75 MG tablet Take 1 tablet (75 mg total) by mouth daily. 90 tablet 3  ? isosorbide mononitrate (IMDUR) 30 MG 24 hr tablet Take 0.5 tablets (15 mg total) by mouth daily. 45 tablet 3  ? levofloxacin (LEVAQUIN) 750 MG tablet Take 750 mg by mouth every morning.    ? losartan (COZAAR) 25 MG tablet Take 1 tablet (25 mg total) by mouth daily. 90 tablet 3  ? metoprolol succinate (TOPROL-XL) 25 MG 24 hr tablet Take 0.5 tablets (12.5 mg total) by mouth daily. (Patient taking differently: Take 12.5 mg by mouth 2 (two) times daily.) 45 tablet 3  ? polyethylene glycol (MIRALAX / GLYCOLAX) 17 g packet Take 17 g by mouth daily as needed for moderate constipation. 14 each 0  ? Vitamin D, Ergocalciferol, (DRISDOL) 1.25 MG (50000  UNIT) CAPS capsule Take 50,000 Units by mouth once a week. Friday    ? ?No current facility-administered medications for this encounter.  ? ?Facility-Administered Medications Ordered in Other Encounters  ?Medication Dose Route Frequency Provider Last Rate Last Admin  ? sodium phosphate (FLEET) 7-19 GM/118ML enema 1 enema  1 enema Rectal Once Vira Agar, MD      ? ? ? ? ?REVIEW OF SYSTEMS:  On review of systems, the patient reports that he is doing well overall. He denies any chest pain, shortness of breath, cough, fevers, chills, night sweats, unintended weight changes. He denies any bowel disturbances, and denies abdominal pain, nausea or vomiting. He denies any new musculoskeletal or joint aches or pains.  His IPSS was Total Score: 0, indicating mild urinary symptoms (Reference 0-7 mild, 8-19 moderate, 20-35 severe).  His SHIM: 1, indicating he does have severe erectile dysfunction (Reference - 22-25 None, 17-21 Mild, 8-16 Moderate, 1-7 Severe). A complete review of systems is obtained and is otherwise negative. ? ? ?  ?PHYSICAL EXAM:  ?Wt Readings from Last 3 Encounters:  ?12/17/21 168 lb 3.2 oz (76.3 kg)  ?11/13/21 165 lb (74.8 kg)  ?10/31/21 165 lb (74.8 kg)  ? ?Temp Readings from Last 3 Encounters:  ?12/17/21 (!) 97.5 ?F (36.4 ?C)  ?11/13/21 97.9 ?F (36.6 ?C) (Oral)  ?11/06/21 98.5 ?F (36.9 ?C) (Oral)  ? ?BP Readings from Last 3 Encounters:  ?12/17/21 (!) 87/74  ?11/13/21 (!) 144/83  ?11/06/21 112/70  ? ?Pulse Readings from Last 3 Encounters:  ?12/17/21 83  ?11/13/21 78  ?11/06/21 79  ? ? /10 ? ?In general this is a well appearing male in no acute distress. He's alert and oriented x4 and appropriate throughout the examination. Cardiopulmonary assessment is negative for acute distress, and he exhibits normal effort.   ? ? ?KPS = 100 ? ?100 - Normal; no complaints; no evidence of disease. ?90   - Able to carry on normal activity; minor signs or symptoms of disease. ?80   - Normal activity with effort; some  signs or symptoms of disease. ?13   - Cares for self; unable to carry on normal activity or to do active work. ?60   - Requires occasional assistance, but is able to care for most of his personal needs. ?

## 2021-12-17 ENCOUNTER — Other Ambulatory Visit: Payer: Self-pay

## 2021-12-17 ENCOUNTER — Ambulatory Visit
Admission: RE | Admit: 2021-12-17 | Discharge: 2021-12-17 | Disposition: A | Discharge status: Home / Self Care | Payer: Medicaid Other | Source: Ambulatory Visit | Attending: Radiation Oncology | Admitting: Radiation Oncology

## 2021-12-17 VITALS — BP 87/74 | HR 83 | Temp 97.5°F | Resp 20 | Ht 72.0 in | Wt 168.2 lb

## 2021-12-17 DIAGNOSIS — Z809 Family history of malignant neoplasm, unspecified: Secondary | ICD-10-CM | POA: Diagnosis not present

## 2021-12-17 DIAGNOSIS — Z87891 Personal history of nicotine dependence: Secondary | ICD-10-CM | POA: Insufficient documentation

## 2021-12-17 DIAGNOSIS — I252 Old myocardial infarction: Secondary | ICD-10-CM | POA: Diagnosis not present

## 2021-12-17 DIAGNOSIS — C61 Malignant neoplasm of prostate: Secondary | ICD-10-CM | POA: Insufficient documentation

## 2021-12-17 DIAGNOSIS — I10 Essential (primary) hypertension: Secondary | ICD-10-CM | POA: Diagnosis not present

## 2021-12-17 DIAGNOSIS — K219 Gastro-esophageal reflux disease without esophagitis: Secondary | ICD-10-CM | POA: Diagnosis not present

## 2021-12-17 DIAGNOSIS — E785 Hyperlipidemia, unspecified: Secondary | ICD-10-CM | POA: Insufficient documentation

## 2021-12-17 DIAGNOSIS — Z79899 Other long term (current) drug therapy: Secondary | ICD-10-CM | POA: Insufficient documentation

## 2021-12-17 DIAGNOSIS — I2510 Atherosclerotic heart disease of native coronary artery without angina pectoris: Secondary | ICD-10-CM | POA: Diagnosis not present

## 2021-12-18 ENCOUNTER — Telehealth: Payer: Self-pay | Admitting: *Deleted

## 2021-12-18 NOTE — Telephone Encounter (Signed)
CALLED PATIENT TO INFORM OF ADT FOR 12-25-21- ARRIVAL TIME- 10:15 AM @ DR. BORDEN'S OFFICE, SPOKE WITH SGO [name] HAMILTON AND SHE IS AWARE OF THIS APPT. ?

## 2021-12-19 ENCOUNTER — Telehealth: Payer: Self-pay | Admitting: Oncology

## 2021-12-19 ENCOUNTER — Ambulatory Visit: Payer: Self-pay | Admitting: Cardiology

## 2021-12-19 NOTE — Telephone Encounter (Signed)
Scheduled appt per 5/3 referral. Pt is aware of appt date and time. Pt is aware to arrive 15 mins prior to appt time and to bring and updated insurance card. Pt is aware of appt location.   ?

## 2021-12-26 ENCOUNTER — Encounter: Payer: Self-pay | Admitting: Internal Medicine

## 2021-12-26 ENCOUNTER — Ambulatory Visit (INDEPENDENT_AMBULATORY_CARE_PROVIDER_SITE_OTHER): Payer: Medicaid Other | Admitting: Internal Medicine

## 2021-12-26 VITALS — BP 98/64 | HR 84 | Temp 97.6°F | Ht 72.0 in | Wt 172.7 lb

## 2021-12-26 DIAGNOSIS — R1312 Dysphagia, oropharyngeal phase: Secondary | ICD-10-CM | POA: Diagnosis not present

## 2021-12-26 DIAGNOSIS — I2583 Coronary atherosclerosis due to lipid rich plaque: Secondary | ICD-10-CM | POA: Diagnosis not present

## 2021-12-26 DIAGNOSIS — G931 Anoxic brain damage, not elsewhere classified: Secondary | ICD-10-CM | POA: Diagnosis not present

## 2021-12-26 DIAGNOSIS — I2510 Atherosclerotic heart disease of native coronary artery without angina pectoris: Secondary | ICD-10-CM

## 2021-12-26 DIAGNOSIS — I252 Old myocardial infarction: Secondary | ICD-10-CM

## 2021-12-26 DIAGNOSIS — E785 Hyperlipidemia, unspecified: Secondary | ICD-10-CM | POA: Diagnosis not present

## 2021-12-26 DIAGNOSIS — I10 Essential (primary) hypertension: Secondary | ICD-10-CM | POA: Diagnosis not present

## 2021-12-26 DIAGNOSIS — R2681 Unsteadiness on feet: Secondary | ICD-10-CM

## 2021-12-26 NOTE — Patient Instructions (Signed)
Thank you, [name] for allowing [place] to provide your care today. Today we discussed: ? ?Low blood pressures: I will discontinue the Imdur (isosorbide) since Antonio Navarro's blood pressures have been low and he has been having dizziness and lightheadedness  I will send his cardiologist a message letting them know we have done this.  Perhaps this will also help Antonio Navarro feel more steady on his feet. ? ?Home needs: I will put in a new order for wheelchair, culture pad, hospital bed, shower chair.  I will also put in orders for home health occupational therapy, physical therapy, speech therapy. ? ? ?My Chart Access: ?[email or web address] ? ?Please follow-up in in 6 months for routine health appointment.  Please call our office to set up an earlier appointment if needed for any reason. ? ?Please make sure to arrive 15 minutes prior to your next appointment. If you arrive late, you may be asked to reschedule.  ?  ?We look forward to seeing you next time. Please call our clinic at [phone] if you have any questions or concerns. The best time to call is Monday-Friday from 9am-4pm, but there is someone available 24/7. If after hours or the weekend, call the main hospital number and ask for the Internal Medicine Resident On-Call. If you need medication refills, please notify your pharmacy one week in advance and they will send [place] a request. ?  ?Thank you for letting us take part in your care. Wishing you the best! ? ?Wayland Denis, MD ?12/26/2021, 2:29 PM ?IM Resident, PGY-1 ? ?

## 2021-12-26 NOTE — Progress Notes (Signed)
?CC: establish care ? ?HPI: ? ?[name] is a 61 y.o. male with a past medical history stated below and presents today to establish care. Please see problem based assessment and plan for additional details. ? ?Patient presents with his sister and fiance who provide the history. They are his primary caregivers. ? ?Past Medical History:  ?Diagnosis Date  ? CAD (coronary artery disease), native coronary artery 07/2020  ? s/p cardiac arrest in setting of STEMI with cath showing 40%pRCA and 95% mLCx s/p PCI  ? Family history of adverse reaction to anesthesia   ? sister has had a hard time waking after surgery  ? GERD (gastroesophageal reflux disease)   ? History of kidney stones   ? HLD (hyperlipidemia)   ? Hypertension   ? Ischemic cardiomyopathy 07/2020  ? EF 40% at time of MI  ? Myocardial infarction [facility])   ? ? ?Current Outpatient Medications on File Prior to Visit  ?Medication Sig Dispense Refill  ? acetaminophen (TYLENOL) 325 MG tablet Take 2 tablets (650 mg total) by mouth every 6 (six) hours as needed for mild pain, fever or headache.    ? aspirin 81 MG chewable tablet Chew 1 tablet (81 mg total) by mouth daily.    ? atorvastatin (LIPITOR) 80 MG tablet Take 1 tablet (80 mg total) by mouth daily. 90 tablet 3  ? clopidogrel (PLAVIX) 75 MG tablet Take 1 tablet (75 mg total) by mouth daily. 90 tablet 3  ? losartan (COZAAR) 25 MG tablet Take 1 tablet (25 mg total) by mouth daily. 90 tablet 3  ? metoprolol succinate (TOPROL-XL) 25 MG 24 hr tablet Take 0.5 tablets (12.5 mg total) by mouth daily. (Patient taking differently: Take 12.5 mg by mouth 2 (two) times daily.) 45 tablet 3  ? polyethylene glycol (MIRALAX / GLYCOLAX) 17 g packet Take 17 g by mouth daily as needed for moderate constipation. 14 each 0  ? Vitamin D, Ergocalciferol, (DRISDOL) 1.25 MG (50000 UNIT) CAPS capsule Take 50,000 Units by mouth once a week. Friday    ? ?No current facility-administered medications on file prior to visit.  ? ? ?Family  History  ?Problem Relation Age of Onset  ? Cancer Mother   ?     Passed at age 76  ? Hypertension Father   ? Congestive Heart Failure Father   ? Breast cancer Sister   ? Diabetes type II Sister   ? Hypertension Sister   ? Hypertension Brother   ? Autism spectrum disorder Brother   ? ? ?Social History:  ?Patient lives in [place] with his fiance. He is WC bound and requires assistance with transfers, all ALDs, and iADLs. ? ?Review of Systems: ?ROS negative except for what is noted on the assessment and plan. ? ?Vitals:  ? 12/26/21 1336  ?BP: 98/64  ?Pulse: 84  ?Temp: 97.6 ?F (36.4 ?C)  ?TempSrc: Oral  ?SpO2: 99%  ?Weight: 172 lb 11.2 oz (78.3 kg)  ?Height: 6' (1.829 m)  ? ? ? ?Physical Exam: ?General: Well appearing african [place] male, normal weight, seated in WC, NAD ?HENT: normocephalic, atraumatic, poor dentition, MMM ?EYES: conjunctiva non-erythematous, no scleral icterus ?CV: regular rate, normal rhythm, no murmurs, rubs, gallops. ?Pulmonary: normal work of breathing on RA, lungs clear to auscultation, no rales, wheezes, rhonchi ?Abdominal: non-distended, soft, non-tender to palpation, normal BS ?Skin: Warm and dry, no rashes or lesions ?Neurological: ?MS: awake, alert and oriented x2 (oriented to name and location, not oriented to date aside from  spring season, did not know the year or his age), dysarthric speech and very limited fund of knowledge, he answers questions appropriately mostly with one-word answers, and follows all simple commands ?Motor: BUE 5/5, patient has difficulties coordinating fingers for grip, BLE 5/5.   ?Psych: happy, confused affect ? ? ? ?Assessment & Plan:  ? ?See Encounters Tab for problem based charting. ? ?Patient discussed with Dr.  Saverio Danker ? ?Wayland Denis, M.D. ?[facility] Internal Medicine, PGY-1 ?Pager: [phone] ?Date 12/26/2021 Time 7:11 PM ? ?

## 2021-12-26 NOTE — Assessment & Plan Note (Signed)
Last lipid profile October 2022, with total cholesterol 92, HDL 48, triglycerides 45, LDL 32.  Given history of STEMI, CAD status post stenting, will continue patient on Lipitor 80 mg daily. ?

## 2021-12-26 NOTE — Assessment & Plan Note (Addendum)
Patient presents to establish care with [facility].  Per chart review, patient experienced anoxic brain injury following STEMI in 2021.  Since this event, patient is wheelchair-bound, reliant on his sister and fianc? for his care.  He requires assistance with all ADLs and IADLs including transfers.  He lives a sedentary life.  Patient received acute therapies during acute hospitalization and therapies at SNF.  Since discharge from SNF he has not had further rehab.  On exam patient has full strength bilateral upper extremities and lower extremities with some difficulty coordinating grip and difficulty with RAM.  Patient's caregivers report that he is off balance and this causes his gait to be unstable.  They report that at the SNF the staff did not feel comfortable with him walking.  Since discharge from SNF he has not made any improvements in his dysarthria, unsteady gait, and coordination. ? ?On exam patient is alert and oriented to self and location only,(not oriented to date aside from spring season, did not know the year or his age, recognizes faces).  His speech was dysarthric and he had very limited fund of knowledge.  He answers questions mostly with one-word answers, and follows all simple commands. His strength is intact though he has difficulty coordinating his fingers for hand grip and cannot perform RAM. Suspect imbalance plays a large role in his gait instability. His caregivers and I are in agreement that he would benefit from further sessions with physical, occupational and speech therapy to improve his quality of life.  He will also continue to need a wheelchair, wheelchair pad, hospital bed, shower chair at home. ?Plan: ?-Face-to-face home health PT, OT, SLP ?-Orders for DME as above ?

## 2021-12-26 NOTE — Assessment & Plan Note (Signed)
Patient's daughter and fianc? report that Antonio Navarro had dysphagia following extubation during his hospitalization for STEMI.  His caregivers report that his dysphagia resolved in the weeks following extubation.  He can tolerate thin liquids and all solids without concern for aspiration. ?

## 2021-12-26 NOTE — Assessment & Plan Note (Addendum)
Patient presents to establish care.  He has a history of hypertension.  Current regimen includes: Metoprolol XL 12.5 twice daily, losartan 25 mg daily, Imdur 15 mg daily.  Patient's fianc? assists with medications, endorses patient has been adherent.  Blood pressure today 98/64 and per chart review patient's blood pressure at last OV with radiation oncology on 5/01 was 87/74.  Today patient endorses dizziness, lightheadedness.  When asked if this is recent or has been going on for a while he reports going on for a while.  At recent visit with cardiology, patient's Imdur had been decreased from 30 mg to 15 mg daily.  Cardiology had indicated that if they were to discontinue an antihypertensive due to low blood pressures, they would choose Imdur first. ? ?On assessment, suspect patient's blood pressure currently overtreated on current regimen. Patient has lost ~20lbs in the last year.  We will discontinue Imdur.  We will send a courtesy message to patient's cardiologist about this. ?Plan: ?-Discontinue Imdur ?-Continue losartan 25 mg daily ?-Continue metoprolol XL 12.'5mg'$  twice daily ?

## 2021-12-26 NOTE — Assessment & Plan Note (Signed)
Patient presents to establish care. Has history of STEMI in 2021 requiring revascularization and stent placement.  This was complicated by anoxic brain injury.  Patient denies chest pain, shortness of breath today. Patient should continue aspirin 81 mg daily and Plavix 75 mg daily. ?

## 2021-12-27 NOTE — Progress Notes (Signed)
Internal Medicine Clinic Attending ? ?Case discussed with Dr. Zinoviev  At the time of the visit.  We reviewed the resident?s history and exam and pertinent patient test results.  I agree with the assessment, diagnosis, and plan of care documented in the resident?s note.  ?

## 2021-12-27 NOTE — Addendum Note (Signed)
Addended by: Charise Killian on: 12/27/2021 09:31 AM ? ? Modules accepted: Level of Service ? ?

## 2021-12-27 NOTE — Addendum Note (Signed)
Addended by: Wayland Denis on: 12/27/2021 08:50 AM ? ? Modules accepted: Orders ? ?

## 2022-01-01 ENCOUNTER — Inpatient Hospital Stay: Payer: Medicaid Other | Attending: Oncology | Admitting: Oncology

## 2022-01-01 ENCOUNTER — Telehealth: Payer: Self-pay

## 2022-01-01 ENCOUNTER — Telehealth: Payer: Self-pay | Admitting: Pharmacy Technician

## 2022-01-01 ENCOUNTER — Other Ambulatory Visit (HOSPITAL_COMMUNITY): Payer: Self-pay

## 2022-01-01 VITALS — BP 121/82 | HR 74 | Temp 97.9°F | Resp 17 | Ht 72.0 in | Wt 172.0 lb

## 2022-01-01 DIAGNOSIS — I2510 Atherosclerotic heart disease of native coronary artery without angina pectoris: Secondary | ICD-10-CM | POA: Diagnosis not present

## 2022-01-01 DIAGNOSIS — C775 Secondary and unspecified malignant neoplasm of intrapelvic lymph nodes: Secondary | ICD-10-CM | POA: Diagnosis not present

## 2022-01-01 DIAGNOSIS — Z87891 Personal history of nicotine dependence: Secondary | ICD-10-CM | POA: Insufficient documentation

## 2022-01-01 DIAGNOSIS — C61 Malignant neoplasm of prostate: Secondary | ICD-10-CM | POA: Insufficient documentation

## 2022-01-01 DIAGNOSIS — I10 Essential (primary) hypertension: Secondary | ICD-10-CM | POA: Diagnosis not present

## 2022-01-01 DIAGNOSIS — G931 Anoxic brain damage, not elsewhere classified: Secondary | ICD-10-CM | POA: Insufficient documentation

## 2022-01-01 DIAGNOSIS — Z8674 Personal history of sudden cardiac arrest: Secondary | ICD-10-CM | POA: Insufficient documentation

## 2022-01-01 DIAGNOSIS — Z79899 Other long term (current) drug therapy: Secondary | ICD-10-CM | POA: Insufficient documentation

## 2022-01-01 MED ORDER — PREDNISONE 5 MG PO TABS
5.0000 mg | ORAL_TABLET | Freq: Every day | ORAL | 1 refills | Status: DC
Start: 2022-01-01 — End: 2023-03-17

## 2022-01-01 MED ORDER — ABIRATERONE ACETATE 250 MG PO TABS
1000.0000 mg | ORAL_TABLET | Freq: Every day | ORAL | 0 refills | Status: DC
  Filled 2022-01-01 – 2022-01-03 (×2): qty 120, 30d supply, fill #0

## 2022-01-01 NOTE — Telephone Encounter (Signed)
Oral Oncology Patient Advocate Encounter ? ?After completing a benefits investigation, prior authorization for Zytiga is not required at this time through [facility]. ? ?Patient's copay is $0.   ? ? Lady Deutscher, CPhT-Adv ?Pharmacy Patient Advocate Specialist ?[facility] Patient Advocate Team ?Direct Number: [phone]  Fax: [phone] ? ?

## 2022-01-01 NOTE — Progress Notes (Signed)
?Reason for the request:    Prostate cancer ? ?HPI: I was asked by Dr. Alinda Money to evaluate Antonio Navarro for the evaluation of prostate cancer.  This is a 61 year old man with history of coronary artery disease with cardiac arrest and suffering anoxic brain injury.  He developed a rise in his PSA to 10.8 in November 2022.  He has subsequently evaluated by Dr. Waylan Rocher and MRI of the prostate obtained in February 2023 showed a PI-RADS 5 lesion in the peripheral zone bilaterally with concern of the seminal vesicle invasion and enlarged left perirectal lymph node.  He underwent biopsy on November 13, 2021 which showed a Gleason score 5+4 = 9 prostate cancer with all 12 cores.  PSMA PET scan obtained on December 13, 2021 showed intense activity in the entire prostate gland as well as mets to the bilateral iliac lymph node station and mesorectal sheath.  Now metastatic disease outside of the pelvis.  He was evaluated by Dr. Alinda Money for a primary surgical therapy and felt that radiation with hormone therapy is a better option for him.  He was also evaluated for Dr. Tammi Klippel agreed at this time.  He was started on androgen deprivation therapy last week under the care of Dr. Alinda Money. ? ?Clinically, he reports no major complaints.  He denies any nausea, fatigue or hematuria.  He denies any dysuria.  He is limited in his performance status between chair and bed given his neurological deficits after brain injury. ? ?He does not report any headaches, blurry vision, syncope or seizures. Does not report any fevers, chills or sweats.  Does not report any cough, wheezing or hemoptysis.  Does not report any chest pain, palpitation, orthopnea or leg edema.  Does not report any nausea, vomiting or abdominal pain.  Does not report any constipation or diarrhea.  Does not report any skeletal complaints.    Does not report frequency, urgency or hematuria.  Does not report any skin rashes or lesions. Does not report any heat or cold intolerance.  Does  not report any lymphadenopathy or petechiae.  Does not report any anxiety or depression.  Remaining review of systems is negative.  ? ? ? ?Past Medical History:  ?Diagnosis Date  ? Anoxic brain injury ([place])   ? 2021  ? CAD (coronary artery disease), native coronary artery 07/2020  ? s/p cardiac arrest in setting of STEMI with cath showing 40%pRCA and 95% mLCx s/p PCI  ? Dysphagia   ? E. coli UTI   ? Elevated blood pressure reading with diagnosis of hypertension   ? Family history of adverse reaction to anesthesia   ? sister has had a hard time waking after surgery  ? Gait instability   ? GERD (gastroesophageal reflux disease)   ? History of kidney stones   ? HLD (hyperlipidemia)   ? Hypertension   ? Ischemic cardiomyopathy 07/2020  ? EF 40% at time of MI  ? Myocardial infarction [facility])   ? STEMI (ST elevation myocardial infarction) ([place])   ? 2021  ?: ? ? ?Past Surgical History:  ?Procedure Laterality Date  ? CORONARY/GRAFT ACUTE MI REVASCULARIZATION N/A 07/22/2020  ? Procedure: Coronary/Graft Acute MI Revascularization;  Surgeon: Lorretta Harp, MD;  Location: [facility];  Service: Cardiovascular;  Laterality: N/A;  ? IR GASTROSTOMY TUBE MOD SED  08/17/2020  ? IR GASTROSTOMY TUBE REMOVAL  11/08/2020  ? LEFT HEART CATH AND CORONARY ANGIOGRAPHY N/A 07/22/2020  ? Procedure: LEFT HEART CATH AND CORONARY ANGIOGRAPHY;  Surgeon: Lorretta Harp, MD;  Location: [facility];  Service: Cardiovascular;  Laterality: N/A;  ? PROSTATE BIOPSY N/A 11/13/2021  ? Procedure: BIOPSY TRANSRECTAL ULTRASONIC PROSTATE (TUBP);  Surgeon: Vira Agar, MD;  Location: [facility];  Service: Urology;  Laterality: N/A;  Cut Off THIS CASE  ? RADIOLOGY WITH ANESTHESIA N/A 10/16/2021  ? Procedure: MRI WITH ANESTHESIA OF PROSTATE WITH AND WITHOUT CONTRAST;  Surgeon: Radiologist, Medication, MD;  Location: [facility];  Service: Radiology;  Laterality: N/A;  ?: ? ? ?Current Outpatient Medications:  ?  acetaminophen (TYLENOL) 325 MG tablet,  Take 2 tablets (650 mg total) by mouth every 6 (six) hours as needed for mild pain, fever or headache., Disp: , Rfl:  ?  aspirin 81 MG chewable tablet, Chew 1 tablet (81 mg total) by mouth daily., Disp: , Rfl:  ?  atorvastatin (LIPITOR) 80 MG tablet, Take 1 tablet (80 mg total) by mouth daily., Disp: 90 tablet, Rfl: 3 ?  clopidogrel (PLAVIX) 75 MG tablet, Take 1 tablet (75 mg total) by mouth daily., Disp: 90 tablet, Rfl: 3 ?  losartan (COZAAR) 25 MG tablet, Take 1 tablet (25 mg total) by mouth daily., Disp: 90 tablet, Rfl: 3 ?  metoprolol succinate (TOPROL-XL) 25 MG 24 hr tablet, Take 0.5 tablets (12.5 mg total) by mouth daily. (Patient taking differently: Take 12.5 mg by mouth 2 (two) times daily.), Disp: 45 tablet, Rfl: 3 ?  polyethylene glycol (MIRALAX / GLYCOLAX) 17 g packet, Take 17 g by mouth daily as needed for moderate constipation., Disp: 14 each, Rfl: 0 ?  Vitamin D, Ergocalciferol, (DRISDOL) 1.25 MG (50000 UNIT) CAPS capsule, Take 50,000 Units by mouth once a week. Friday, Disp: , Rfl: : ? ?No Known Allergies: ? ? ?Family History  ?Problem Relation Age of Onset  ? Cancer Mother   ?     Passed at age 29  ? Hypertension Father   ? Congestive Heart Failure Father   ? Breast cancer Sister   ? Diabetes type II Sister   ? Hypertension Sister   ? Hypertension Brother   ? Autism spectrum disorder Brother   ?: ? ? ?Social History  ? ?Socioeconomic History  ? Marital status: Single  ?  Spouse name: Not on file  ? Number of children: Not on file  ? Years of education: Not on file  ? Highest education level: Not on file  ?Occupational History  ? Not on file  ?Tobacco Use  ? Smoking status: Former  ?  Packs/day: 1.00  ?  Types: Cigarettes  ? Smokeless tobacco: Never  ?Vaping Use  ? Vaping Use: Never used  ?Substance and Sexual Activity  ? Alcohol use: Not Currently  ?  Comment: occasional  ? Drug use: Not Currently  ?  Types: Marijuana  ?  Comment: occ  ? Sexual activity: Not on file  ?Other Topics Concern  ? Not on  file  ?Social History Narrative  ? Not on file  ? ?Social Determinants of Health  ? ?Financial Resource Strain: Not on file  ?Food Insecurity: Not on file  ?Transportation Needs: Not on file  ?Physical Activity: Not on file  ?Stress: Not on file  ?Social Connections: Not on file  ?Intimate Partner Violence: Not on file  ?: ? ?Pertinent items are noted in HPI. ? ?Exam: ? ?General appearance: alert and cooperative appeared without distress. ?Head: atraumatic without any abnormalities. ?Eyes: conjunctivae/corneas clear. PERRL.  Sclera anicteric. ?Throat: lips, mucosa, and tongue  normal; without oral thrush or ulcers. ?Resp: clear to auscultation bilaterally without rhonchi, wheezes or dullness to percussion. ?Cardio: regular rate and rhythm, S1, S2 normal, no murmur, click, rub or gallop ?GI: soft, non-tender; bowel sounds normal; no masses,  no organomegaly ?Skin: Skin color, texture, turgor normal. No rashes or lesions ?Lymph nodes: Cervical, supraclavicular, and axillary nodes normal. ?Neurologic: Grossly normal without any motor, sensory or deep tendon reflexes. ?Musculoskeletal: No joint deformity or effusion. ? ? ? ?NM PET (PSMA) SKULL TO MID THIGH ? ?Result Date: 12/14/2021 ?CLINICAL DATA:  Prostate carcinoma with biochemical recurrence. EXAM: NUCLEAR MEDICINE PET SKULL BASE TO THIGH TECHNIQUE: 9.28 mCi F18 Piflufolastat (Pylarify) was injected intravenously. Full-ring PET imaging was performed from the skull base to thigh after the radiotracer. CT data was obtained and used for attenuation correction and anatomic localization. COMPARISON:  None. FINDINGS: NECK No radiotracer activity in neck lymph nodes. Incidental CT finding: None CHEST No radiotracer accumulation within mediastinal or hilar lymph nodes. No suspicious pulmonary nodules on the CT scan. Incidental CT finding: None ABDOMEN/PELVIS Prostate: Intense radiotracer activity involving the near entirety of the prostate gland with SUV max equal 13.7. Lymph  nodes: Minimally enlarged but intensely radiotracer avid pelvic lymph nodes are present. For example LEFT external iliac node adjacent to the operator fossa measures 5 mm (image 199) with SUV max equal

## 2022-01-01 NOTE — Telephone Encounter (Signed)
Oral Oncology Pharmacist Encounter ? ?Received new prescription for abiraterone (Zytiga) for the treatment of prostate cancer in conjunction with prednisone, planned duration until disease progression or unacceptable toxicity. ? ?Labs from 11/06/21 assessed, no interventions needed. Prescription dose and frequency assessed.  ? ?Current medication list in Epic reviewed, DDIs with Zytiga identified:  ?- atorvastatin: may increase rhabdomyolysis effects; will monitor.  ?- Metoprolol: will inform patient to monitor blood pressure; zytiga can increase concentration of metoprolol ? ?Evaluated chart and no patient barriers to medication adherence noted.  ? ?Patient agreement for treatment documented in MD note on 01/01/2022. ? ?Prescription has been e-scribed to the [facility] for benefits analysis and approval. ? ?Oral Oncology Clinic will continue to follow for insurance authorization, copayment issues, initial counseling and start date. ? ?Drema Halon, PharmD ?Hematology/Oncology Clinical Pharmacist ?[facility] ?[phone] ?01/01/2022 3:05 PM ? ?

## 2022-01-02 ENCOUNTER — Other Ambulatory Visit (HOSPITAL_COMMUNITY): Payer: Self-pay

## 2022-01-03 ENCOUNTER — Other Ambulatory Visit (HOSPITAL_COMMUNITY): Payer: Self-pay

## 2022-01-03 NOTE — Telephone Encounter (Addendum)
Oral Chemotherapy Pharmacist Encounter  I spoke with caregiver and patient for overview of: Zytiga for the treatment of prostate cancer in conjunction with prednisone, planned duration until disease progression or unacceptable toxicity.   Counseled patient on administration, dosing, side effects, monitoring, drug-food interactions, safe handling, storage, and disposal.  Patient will take Zytiga '250mg'$  tablets, 4 tablets ('1000mg'$ ) by mouth once daily on an empty stomach, 1 hour before or 2 hours after a meal.  Patient states he will take his Zytiga in the morning and will wait at least 1 hour before eating.  Patient will take prednisone '5mg'$  tablet, 1 tablet by mouth one daily with breakfast.  Zytiga start date: 01/05/2022  Adverse effects include but are not limited to: peripheral edema, GI upset, hypertension, hot flashes, fatigue, and arthralgias.    Prednisone prescription has been sent to [facility] on 01/01/2022. Patient will obtain prednisone and knows to start prednisone on the same day as Zytiga start.  Reviewed with patient importance of keeping a medication schedule and plan for any missed doses. No barriers to medication adherence identified.  Medication reconciliation performed and medication/allergy list updated.  Insurance authorization for Fabio Asa has been obtained. Test claim at the pharmacy revealed copayment $0 for 1st fill of 30 days. Patients sister, Adline Mango, will have medication shipped to patient on 01/03/22 for delivery on 01/04/22.   Patients fiance will help with giving the patient the medication and was instructed to wear gloves as it is a hazardous medication.  Patient informed the pharmacy will reach out 5-7 days prior to needing next fill of Zytiga to coordinate continued medication acquisition to prevent break in therapy.  All questions answered.  Mr. Prater and caregiver voiced understanding and appreciation.   Medication education handout placed  in mail for patient. Patient knows to call the office with questions or concerns. Oral Chemotherapy Clinic phone number provided to patient.   Drema Halon, PharmD Hematology/Oncology Clinical Pharmacist [facility] [phone] 01/03/2022 10:02 AM

## 2022-01-07 ENCOUNTER — Other Ambulatory Visit: Payer: Self-pay | Admitting: Urology

## 2022-01-10 ENCOUNTER — Encounter: Payer: Self-pay | Admitting: Urology

## 2022-01-10 ENCOUNTER — Telehealth: Payer: Self-pay | Admitting: Oncology

## 2022-01-10 NOTE — Telephone Encounter (Signed)
Called patient regarding upcoming appointment, patient is notified. °

## 2022-01-10 NOTE — Progress Notes (Signed)
Patient got 6 month Eligard injection 12/25/21 and is scheduled for FM and SO 02/26/22 and CT [facility] 02/28/22.  Nicholos Johns, MMS, PA-C [facility] [place]: [phone]  Fax: [phone]

## 2022-01-17 ENCOUNTER — Other Ambulatory Visit (HOSPITAL_COMMUNITY): Payer: Self-pay

## 2022-01-22 ENCOUNTER — Other Ambulatory Visit (HOSPITAL_COMMUNITY): Payer: Self-pay

## 2022-01-22 ENCOUNTER — Telehealth: Payer: Self-pay

## 2022-01-22 NOTE — Telephone Encounter (Signed)
-----   Message from Boyce Medici, [facility] sent at 01/21/2022 10:04 AM EDT ----- Kermit Balo morning Bonnita Nasuti and Dr. Alen Blew,  Patients sister, Lottie Dawson, would like a phone call about her brother. She states he has been refusing to take the oral chemo medication, Zytiga, but is still taking all the other medications. She does not think it is a pill size problem more of that he does not want to take. She is requesting more information on what to do next with radiation, etc.  Thanks, Joellen Jersey

## 2022-01-22 NOTE — Telephone Encounter (Signed)
Spoke with pt's sister, [name], and she does not know why he refuses to take the [place].  He took it the firsat day but has not taken it since.  Her question is if he is not taking the Zytiga do they need to continue with radiation?  Please advise

## 2022-01-23 NOTE — Telephone Encounter (Signed)
Pt's sister, Juventino Slovak, advised pt to continue radiation and Fabio Asa will be addressed later.  She voiced understanding and agreed to plan.

## 2022-02-05 ENCOUNTER — Other Ambulatory Visit: Payer: Self-pay

## 2022-02-05 MED ORDER — ATORVASTATIN CALCIUM 80 MG PO TABS: 80.0000 mg | ORAL_TABLET | Freq: Every day | ORAL | 3 refills | Status: DC

## 2022-02-05 MED ORDER — LOSARTAN POTASSIUM 25 MG PO TABS: 25.0000 mg | ORAL_TABLET | Freq: Every day | ORAL | 3 refills | Status: DC

## 2022-02-08 ENCOUNTER — Other Ambulatory Visit (HOSPITAL_COMMUNITY): Payer: Self-pay

## 2022-02-11 ENCOUNTER — Other Ambulatory Visit (HOSPITAL_COMMUNITY): Payer: Self-pay

## 2022-02-14 ENCOUNTER — Inpatient Hospital Stay (HOSPITAL_BASED_OUTPATIENT_CLINIC_OR_DEPARTMENT_OTHER): Payer: Medicaid Other | Admitting: Oncology

## 2022-02-14 ENCOUNTER — Other Ambulatory Visit: Payer: Self-pay

## 2022-02-14 ENCOUNTER — Inpatient Hospital Stay: Payer: Medicaid Other | Attending: Oncology

## 2022-02-14 VITALS — BP 118/98 | HR 121 | Temp 97.5°F | Resp 17 | Wt 157.6 lb

## 2022-02-14 DIAGNOSIS — Z7952 Long term (current) use of systemic steroids: Secondary | ICD-10-CM | POA: Diagnosis not present

## 2022-02-14 DIAGNOSIS — E291 Testicular hypofunction: Secondary | ICD-10-CM | POA: Insufficient documentation

## 2022-02-14 DIAGNOSIS — C775 Secondary and unspecified malignant neoplasm of intrapelvic lymph nodes: Secondary | ICD-10-CM | POA: Insufficient documentation

## 2022-02-14 DIAGNOSIS — C61 Malignant neoplasm of prostate: Secondary | ICD-10-CM | POA: Insufficient documentation

## 2022-02-14 DIAGNOSIS — Z79899 Other long term (current) drug therapy: Secondary | ICD-10-CM | POA: Diagnosis not present

## 2022-02-14 LAB — CBC WITH DIFFERENTIAL (CANCER CENTER ONLY)
Abs Immature Granulocytes: 0.01 10*3/uL (ref 0.00–0.07)
Basophils Absolute: 0.1 10*3/uL (ref 0.0–0.1)
Basophils Relative: 1 %
Eosinophils Absolute: 0.1 10*3/uL (ref 0.0–0.5)
Eosinophils Relative: 2 %
HCT: 41.6 % (ref 39.0–52.0)
Hemoglobin: 13.9 g/dL (ref 13.0–17.0)
Immature Granulocytes: 0 %
Lymphocytes Relative: 40 %
Lymphs Abs: 2.1 10*3/uL (ref 0.7–4.0)
MCH: 31.2 pg (ref 26.0–34.0)
MCHC: 33.4 g/dL (ref 30.0–36.0)
MCV: 93.3 fL (ref 80.0–100.0)
Monocytes Absolute: 0.3 10*3/uL (ref 0.1–1.0)
Monocytes Relative: 5 %
Neutro Abs: 2.7 10*3/uL (ref 1.7–7.7)
Neutrophils Relative %: 52 %
Platelet Count: 152 10*3/uL (ref 150–400)
RBC: 4.46 MIL/uL (ref 4.22–5.81)
RDW: 12 % (ref 11.5–15.5)
WBC Count: 5.2 10*3/uL (ref 4.0–10.5)
nRBC: 0 % (ref 0.0–0.2)

## 2022-02-14 LAB — CMP (CANCER CENTER ONLY)
ALT: 25 U/L (ref 0–44)
AST: 15 U/L (ref 15–41)
Albumin: 4 g/dL (ref 3.5–5.0)
Alkaline Phosphatase: 62 U/L (ref 38–126)
Anion gap: 6 (ref 5–15)
BUN: 21 mg/dL — ABNORMAL HIGH (ref 6–20)
CO2: 31 mmol/L (ref 22–32)
Calcium: 9.9 mg/dL (ref 8.9–10.3)
Chloride: 108 mmol/L (ref 98–111)
Creatinine: 1.14 mg/dL (ref 0.61–1.24)
GFR, Estimated: >60 mL/min (ref >60)
Glucose, Bld: 215 mg/dL — ABNORMAL HIGH (ref 70–99)
Potassium: 4.1 mmol/L (ref 3.5–5.1)
Sodium: 145 mmol/L (ref 135–145)
Total Bilirubin: 0.7 mg/dL (ref 0.3–1.2)
Total Protein: 7.2 g/dL (ref 6.5–8.1)

## 2022-02-14 NOTE — Progress Notes (Signed)
Hematology and Oncology Follow Up Visit  Antonio Navarro [id number] [birth year] 61 y.o. 02/14/2022 11:47 AM Antonio Laos, MD   Principle Diagnosis: 61 year old with prostate cancer diagnosed in March 2023.  He was found to have Gleason score 5+4 = 9, PSA of 10.81.  He was found to have clinical stage T3bN1 with regional lymph node involvement.   Prior Therapy: Prostate biopsy completed on November 13, 2021 with Gleason score 5+4 = 9 in all 12 cores.  Current therapy:  Androgen deprivation therapy under [facility] care of [facility].  He received Eligard 45 mg on Dec 25, 2021.  Zytiga 1000 mg with prednisone 5 mg daily started on Jan 05, 2022.  He is currently not taking it.   Interim History: Antonio Navarro returns today for a follow-up visit.  Since last visit, he reports no major changes in his health.  He denies any complaints.  He has not been able to take Zytiga since it was prescribed.  According to his companion today he only took it 2 for 2 days.  It is unclear the reason for.  He reports a specific side effects but predominantly difficultly swallowing.  He denies any bone pain or pathological fractures.  He denies any hospitalizations or illnesses.    Medications: I have reviewed the patient's current medications.  Current Outpatient Medications  Medication Sig Dispense Refill   abiraterone acetate (ZYTIGA) 250 MG tablet Take 4 tablets (1,000 mg total) by mouth daily. Take on an empty stomach 1 hour before or 2 hours after a meal 120 tablet 0   acetaminophen (TYLENOL) 325 MG tablet Take 2 tablets (650 mg total) by mouth every 6 (six) hours as needed for mild pain, fever or headache.     aspirin 81 MG chewable tablet Chew 1 tablet (81 mg total) by mouth daily.     atorvastatin (LIPITOR) 80 MG tablet Take 1 tablet (80 mg total) by mouth daily. 90 tablet 3   clopidogrel (PLAVIX) 75 MG tablet Take 1 tablet (75 mg total) by mouth daily. 90 tablet 3   losartan (COZAAR) 25 MG  tablet Take 1 tablet (25 mg total) by mouth daily. 90 tablet 3   metoprolol succinate (TOPROL-XL) 25 MG 24 hr tablet Take 0.5 tablets (12.5 mg total) by mouth daily. (Patient taking differently: Take 12.5 mg by mouth 2 (two) times daily.) 45 tablet 3   polyethylene glycol (MIRALAX / GLYCOLAX) 17 g packet Take 17 g by mouth daily as needed for moderate constipation. 14 each 0   predniSONE (DELTASONE) 5 MG tablet Take 1 tablet (5 mg total) by mouth daily with breakfast. 90 tablet 1   Vitamin D, Ergocalciferol, (DRISDOL) 1.25 MG (50000 UNIT) CAPS capsule Take 50,000 Units by mouth once a week. Friday     No current facility-administered medications for this visit.     Allergies: No Known Allergies    Physical Exam: Blood pressure (!) 118/98, pulse (!) 121, temperature (!) 97.5 F (36.4 C), temperature source Temporal, resp. rate 17, weight 157 lb 9.6 oz (71.5 kg), SpO2 96 %.  ECOG:  2    General appearance: Comfortable appearing without any discomfort Head: Normocephalic without any trauma Oropharynx: Mucous membranes are moist and pink without any thrush or ulcers. Eyes: Pupils are equal and round reactive to light. Lymph nodes: No cervical, supraclavicular, inguinal or axillary lymphadenopathy.   Heart:regular rate and rhythm.  S1 and S2 without leg edema. Lung: Clear without any rhonchi or wheezes.  No  dullness to percussion. Abdomin: Soft, nontender, nondistended with good bowel sounds.  No hepatosplenomegaly. Musculoskeletal: No joint deformity or effusion.  Full range of motion noted. Neurological: No deficits noted on motor, sensory and deep tendon reflex exam. Skin: No petechial rash or dryness.  Appeared moist.     Lab Results: Lab Results  Component Value Date   WBC 4.9 11/06/2021   HGB 12.9 (L) 11/06/2021   HCT 38.7 (L) 11/06/2021   MCV 96.0 11/06/2021   PLT 153 11/06/2021     Chemistry      Component Value Date/Time   NA 141 11/06/2021 1456   NA 144  06/04/2021 0848   K 4.2 11/06/2021 1456   CL 108 11/06/2021 1456   CO2 27 11/06/2021 1456   BUN 16 11/06/2021 1456   BUN 14 06/04/2021 0848   CREATININE 0.97 11/06/2021 1456      Component Value Date/Time   CALCIUM 9.3 11/06/2021 1456   ALKPHOS 78 06/04/2021 0848   AST 17 06/04/2021 0848   ALT 22 06/04/2021 0848   BILITOT 1.2 06/04/2021 0848          Impression and Plan:   61 year old with:  1.  T3bN1 prostate cancer diagnosed in March 2023.  He was found to have a Gleason score of 5+4 = 9 and a PSA of 10.  PSMA PET scan showed disease in the bilateral iliac lymph nodes and mesorectal sheath.    The natural course of his disease was reviewed at this time and treatment choices were discussed.  He does not have distant metastasis but does have high risk lymph node positive prostate cancer.   Long-term complications that include nausea, vomiting, edema and adrenal insufficiency were reviewed.  After discussion today, we opted against resuming Zytiga.  He is unable or unwilling to take it and overall overwhelmed with the treatment modalities at this time.  I recommended proceeding with androgen deprivation therapy alone and emphasized the importance of definitive radiation therapy as his treatment modality.  Zytiga can be considered again in the future if he developed relapsed disease.   2.  Androgen deprivation therapy: He is currently on Eligard and will be repeated every 6 months for total of at least 2 years.  He is receiving that under the care of [facility].  I emphasized the importance of continuing this for at least 2 years.   3.  Local therapy: He is currently under evaluation to start radiation therapy in the care of Dr. Tammi Klippel.     4.  Follow-up: Will be as needed in the future.   30  minutes were spent on this encounter.  The time was dedicated to reviewing laboratory data, disease status update, treatment choices and future plan of care discussion.       Zola Button, MD 6/29/202311:47 AM

## 2022-02-14 NOTE — Progress Notes (Signed)
Patient will need cardiac clearance and instructions for holding Plavix preop. Message left for Coni at [facility].

## 2022-02-15 LAB — PROSTATE-SPECIFIC AG, SERUM (LABCORP): Prostate Specific Ag, Serum: 3.3 ng/mL (ref 0.0–4.0)

## 2022-02-16 DIAGNOSIS — Z923 Personal history of irradiation: Secondary | ICD-10-CM

## 2022-02-16 HISTORY — DX: Personal history of irradiation: Z92.3

## 2022-02-18 ENCOUNTER — Telehealth: Payer: Self-pay | Admitting: Cardiology

## 2022-02-18 NOTE — Telephone Encounter (Signed)
   Pre-operative Risk Assessment    Patient Name: Antonio Navarro  DOB: [birth year] MRN: [id number]      Request for Surgical Clearance    Procedure:  Gold seed marker placed for Prostate Cancer  Date of Surgery:  Clearance  02-26-22                                  Surgeon:  Dr Terrilee Files Surgeon's Group or Practice Name:   Phone number:  [phone] Fax number:  [phone]   Type of Clearance Requested:   - Medical  - Pharmacy:  Hold Clopidogrel (Plavix) 5 days prior to surgery   Type of Anesthesia:  MAC   Additional requests/questions:    Lorin Glass   02/18/2022, 8:09 AM

## 2022-02-18 NOTE — Telephone Encounter (Signed)
Left message for the pt to call the office for tele appt for pre op

## 2022-02-18 NOTE — Telephone Encounter (Signed)
   Name: Antonio Navarro  DOB: [birth year]  MRN: [id number]  Primary Cardiologist: Fransico Him, MD   Preoperative team, please contact this patient and set up a phone call appointment for further preoperative risk assessment. Please obtain consent and complete medication review. Thank you for your help.  I confirm that guidance regarding antiplatelet and oral anticoagulation therapy has been completed and, if necessary, noted below.  Per office protocol, patient may hold Plavix for 5 days prior to procedure.  Please resume Plavix as soon as possible postprocedure, at the discretion of the surgeon.  Lenna Sciara, NP 02/18/2022, 10:32 AM [place] [address] [place],  [id number]

## 2022-02-20 ENCOUNTER — Telehealth: Payer: Self-pay | Admitting: *Deleted

## 2022-02-20 NOTE — Telephone Encounter (Signed)
I s/w the pt's fiance. There is no DPR on file from our office; however there is another [facility] form that is giving permission to s/w her and pt's sister. I did inform the fiance that we will go with this form as it is from a [facility] facility, However, the next time in the office they will need to fill out a DPR for Antonio Navarro. Fiance is agreeable to plan of care.    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 I said then maybe we will need to bring the pt in the office. This was an agreeable plan of care. I d/w Christen Bame, NP, if I may be able to use a provider use time slot 02/22/22 @ 2:45. I also did offer a 1:55 pm for today with Melina Copa, PAC, but fiance says the pt is sleeping.    Speaking with the fiance about appts, she then tells me, she feels he (the pt) would be able to do a tele visit tomorrow and can she just call Antonio Navarro tomorrow when he is awake and alert. I stated no that the tele visit works by Antonio Navarro calling them. I explained that I cannot have the pt on 2 different schedules. Fiance opted to do tele visit with the pt tomorrow. She will be on the phone with him to help answer questions.   She has reviewed medications and has given consent. I will update the requesting office of this call today. I had stated to the fiance, since we just the clearance request Monday 02/18/22. We had left a message for the pt, though we got no return call from the pt to set up a tele appt, that his procedure may need to be pushed out a bit to allow enough time for holding blood thinner.       Patient Consent for Virtual Visit        Antonio Navarro has provided verbal consent on 02/20/2022 for a virtual visit (video or telephone).   CONSENT FOR VIRTUAL VISIT FOR:  Antonio Navarro  By participating in this virtual visit I agree to the following:  I hereby voluntarily request, consent and authorize [facility] and its employed or contracted  physicians, physician assistants, nurse practitioners or other licensed health care professionals (the Practitioner), to provide me with telemedicine health care services (the "Services") as deemed necessary by the treating Practitioner. I acknowledge and consent to receive the Services by the Practitioner via telemedicine. I understand that the telemedicine visit will involve communicating with the Practitioner through live audiovisual communication technology and the disclosure of certain medical information by electronic transmission. I acknowledge that I have been given the opportunity to request an in-person assessment or other available alternative prior to the telemedicine visit and am voluntarily participating in the telemedicine visit.  I understand that I have the right to withhold or withdraw my consent to the use of telemedicine in the course of my care at any time, without affecting my right to future care or treatment, and that the Practitioner or I may terminate the telemedicine visit at any time. I understand that I have the right to inspect all information obtained and/or recorded in the course of the telemedicine visit and may receive copies of available information for a reasonable fee.  I understand that some of the potential risks of receiving the Services via telemedicine include:  Delay  or interruption in medical evaluation due to technological equipment failure or disruption; Information transmitted may not be sufficient (e.g. poor resolution of images) to allow for appropriate medical decision making by the Practitioner; and/or  In rare instances, security protocols could fail, causing a breach of personal health information.  Furthermore, I acknowledge that it is my responsibility to provide information about my medical history, conditions and care that is complete and accurate to the best of my ability. I acknowledge that Practitioner's advice, recommendations, and/or decision may  be based on factors not within their control, such as incomplete or inaccurate data provided by me or distortions of diagnostic images or specimens that may result from electronic transmissions. I understand that the practice of medicine is not an exact science and that Practitioner makes no warranties or guarantees regarding treatment outcomes. I acknowledge that a copy of this consent can be made available to me via my patient portal ([facility]), or I can request a printed copy by calling the office of [facility].    I understand that my insurance will be billed for this visit.   I have read or had this consent read to me. I understand the contents of this consent, which adequately explains the benefits and risks of the Services being provided via telemedicine.  I have been provided ample opportunity to ask questions regarding this consent and the Services and have had my questions answered to my satisfaction. I give my informed consent for the services to be provided through the use of telemedicine in my medical care

## 2022-02-20 NOTE — Telephone Encounter (Signed)
I s/w the pt's fiance. There is no DPR on file from our office; however there is another [facility] form that is giving permission to s/w her and pt's sister. I did inform the fiance that we will go with this form as it is from a [facility] facility, However, the next time in the office they will need to fill out a DPR for [place]. Fiance is agreeable to plan of care.   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 I said then maybe we will need to bring the pt in the office. This was an agreeable plan of care. I d/w Christen Bame, NP, if I may be able to use a provider use time slot 02/22/22 @ 2:45. I also did offer a 1:55 pm for today with Melina Copa, PAC, but fiance says the pt is sleeping.   Speaking with the fiance about appts, she then tells me, she feels he (the pt) would be able to do a tele visit tomorrow and can she just call us tomorrow when he is awake and alert. I stated no that the tele visit works by [place] calling them. I explained that I cannot have the pt on 2 different schedules. Fiance opted to do tele visit with the pt tomorrow. She will be on the phone with him to help answer questions.  She has reviewed medications and has given consent. I will update the requesting office of this call today. I had stated to the fiance, since we just the clearance request Monday 02/18/22. We had left a message for the pt, though we got no return call from the pt to set up a tele appt, that his procedure may need to be pushed out a bit to allow enough time for holding blood thinner.

## 2022-02-21 ENCOUNTER — Ambulatory Visit: Payer: Self-pay | Admitting: Cardiology

## 2022-02-21 ENCOUNTER — Ambulatory Visit (INDEPENDENT_AMBULATORY_CARE_PROVIDER_SITE_OTHER): Payer: Medicaid Other | Admitting: Nurse Practitioner

## 2022-02-21 DIAGNOSIS — Z01810 Encounter for preprocedural cardiovascular examination: Secondary | ICD-10-CM | POA: Diagnosis not present

## 2022-02-21 NOTE — Progress Notes (Signed)
Virtual Visit via Telephone Note   Because of Antonio Navarro's co-morbid illnesses, he is at least at moderate risk for complications without adequate follow up.  This format is felt to be most appropriate for this patient at this time.  The patient did not have access to video technology/had technical difficulties with video requiring transitioning to audio format only (telephone).  All issues noted in this document were discussed and addressed.  No physical exam could be performed with this format.  Please refer to the patient's chart for his consent to telehealth for [facility].  Evaluation Performed:  Preoperative cardiovascular risk assessment _____________   Date:  02/21/2022   Patient ID:  Antonio Navarro, DOB [birth year], MRN [id number] Patient Location:  Home Provider location:   Office  Primary Care Provider:  Johny Blamer, DO Primary Cardiologist:  Fransico Him, MD  Chief Complaint / Patient Profile   61 y.o. y/o male with a h/o CAD s/p DES-LCx, ICM, hypertension, hyperlipidemia, GERD, and prostate cancer who is pending gold seed marker placement on 02/26/2022 with Dr. Terrilee Files and presents today for telephonic preoperative cardiovascular risk assessment with the assistance of his significant other.  Past Medical History    Past Medical History:  Diagnosis Date   Anoxic brain injury ([place])    2021   CAD (coronary artery disease), native coronary artery 07/2020   s/p cardiac arrest in setting of STEMI with cath showing 40%pRCA and 95% mLCx s/p PCI   Dysphagia    E. coli UTI    Elevated blood pressure reading with diagnosis of hypertension    Family history of adverse reaction to anesthesia    sister has had a hard time waking after surgery   Gait instability    GERD (gastroesophageal reflux disease)    History of kidney stones    HLD (hyperlipidemia)    Hypertension    Ischemic cardiomyopathy 07/2020   EF 40% at time of MI   Myocardial infarction [facility])     STEMI (ST elevation myocardial infarction) ([place])    2021   Past Surgical History:  Procedure Laterality Date   CORONARY/GRAFT ACUTE MI REVASCULARIZATION N/A 07/22/2020   Procedure: Coronary/Graft Acute MI Revascularization;  Surgeon: Lorretta Harp, MD;  Location: [facility];  Service: Cardiovascular;  Laterality: N/A;   IR GASTROSTOMY TUBE MOD SED  08/17/2020   IR GASTROSTOMY TUBE REMOVAL  11/08/2020   LEFT HEART CATH AND CORONARY ANGIOGRAPHY N/A 07/22/2020   Procedure: LEFT HEART CATH AND CORONARY ANGIOGRAPHY;  Surgeon: Lorretta Harp, MD;  Location: [facility];  Service: Cardiovascular;  Laterality: N/A;   PROSTATE BIOPSY N/A 11/13/2021   Procedure: BIOPSY TRANSRECTAL ULTRASONIC PROSTATE (TUBP);  Surgeon: Vira Agar, MD;  Location: [facility];  Service: Urology;  Laterality: N/A;  60 MINS FOR THIS CASE   RADIOLOGY WITH ANESTHESIA N/A 10/16/2021   Procedure: MRI WITH ANESTHESIA OF PROSTATE WITH AND WITHOUT CONTRAST;  Surgeon: Radiologist, Medication, MD;  Location: [facility];  Service: Radiology;  Laterality: N/A;    Allergies  No Known Allergies  History of Present Illness    Antonio Navarro is a 61 y.o. male who presents via audio/video conferencing for a telehealth visit today accompanied by his significant other.  Pt was last seen in cardiology clinic on 10/31/2021 by Dr. Gasper Sells. At that time WITTEN CERTAIN was doing well. The patient is now pending procedure as outlined above. Since his last visit, he has been stable  from a cardiac standpoint. His activity is limited at baseline, this is unchanged and is not due to cardiac symptoms. He denies chest pain, palpitations, dyspnea, pnd, orthopnea, n, v, dizziness, syncope, edema, weight gain, or early satiety. All other systems reviewed and are otherwise negative except as noted above.    Home Medications    Prior to Admission medications   Medication Sig Start Date End Date Taking? Authorizing Provider  abiraterone  acetate (ZYTIGA) 250 MG tablet Take 4 tablets (1,000 mg total) by mouth daily. Take on an empty stomach 1 hour before or 2 hours after a meal 01/01/22   Wyatt Portela, MD  acetaminophen (TYLENOL) 325 MG tablet Take 2 tablets (650 mg total) by mouth every 6 (six) hours as needed for mild pain, fever or headache. 11/24/20   Samella Parr, NP  aspirin 81 MG chewable tablet Chew 1 tablet (81 mg total) by mouth daily. 11/24/20   Samella Parr, NP  atorvastatin (LIPITOR) 80 MG tablet Take 1 tablet (80 mg total) by mouth daily. 02/05/22   Wayland Denis, MD  clopidogrel (PLAVIX) 75 MG tablet Take 1 tablet (75 mg total) by mouth daily. 06/04/21   Sueanne Margarita, MD  losartan (COZAAR) 25 MG tablet Take 1 tablet (25 mg total) by mouth daily. 02/05/22   Wayland Denis, MD  metoprolol succinate (TOPROL-XL) 25 MG 24 hr tablet Take 0.5 tablets (12.5 mg total) by mouth daily. Patient taking differently: Take 12.5 mg by mouth 2 (two) times daily. 06/04/21   Sueanne Margarita, MD  polyethylene glycol (MIRALAX / GLYCOLAX) 17 g packet Take 17 g by mouth daily as needed for moderate constipation. 11/24/20   Samella Parr, NP  predniSONE (DELTASONE) 5 MG tablet Take 1 tablet (5 mg total) by mouth daily with breakfast. 01/01/22   Wyatt Portela, MD  Vitamin D, Ergocalciferol, (DRISDOL) 1.25 MG (50000 UNIT) CAPS capsule Take 50,000 Units by mouth once a week. Friday 10/18/21   [provider]    Physical Exam    Vital Signs:  [name] does not have vital signs available for review today.  Given telephonic nature of communication, physical exam is limited. AAOx3. NAD. Normal affect.  Speech and respirations are unlabored.  Accessory Clinical Findings    None  Assessment & Plan    1.  Preoperative Cardiovascular Risk Assessment:  According to the Revised Cardiac Risk Index (RCRI), his Perioperative Risk of Major Cardiac Event is (%): 6.6. His Functional Capacity in METs is: 3.63 according to the [facility]  Activity Status Index (DASI).  However, this is his baseline.  Patient's functional capacity not limited due to cardiac symptoms. Case also discussed with Dr. Dennison Bulla who last patient in March 2023 for preoperative cardiac evaluation agrees that patient would be at low to moderate (acceptable) risk for the planned procedure without further cardiovascular testing.   Per office protocol, patient may hold Plavix for 5 days prior to procedure.  Please resume Plavix as soon as possible postprocedure, at the discretion of the surgeon  A copy of this note will be routed to requesting surgeon.  Time:   Today, I have spent 10 minutes with the patient with telehealth technology discussing medical history, symptoms, and management plan.    Lenna Sciara, NP  02/21/2022, 2:02 PM

## 2022-02-22 ENCOUNTER — Encounter (HOSPITAL_BASED_OUTPATIENT_CLINIC_OR_DEPARTMENT_OTHER): Payer: Self-pay | Admitting: Urology

## 2022-02-22 NOTE — Progress Notes (Addendum)
Spoke w/ via phone for pre-op interview--- pt's sig other (fiance) , [employer]----  no             Lab results------ pt has current lab work done on 02-14-2022 CBCdiff/ CMP results in epic;  current ekg in epic/ chart COVID test -----patient states asymptomatic no test needed Arrive at ------- 0800 on 02-26-2022 NPO after MN NO Solid Food.  Clear liquids from MN until--- 0600 Med rec completed Medications to take morning of surgery ----- toprol, prednisone Diabetic medication ----- n/a Patient instructed no nail polish to be worn day of surgery Patient instructed to bring photo id and insurance card day of surgery Patient aware to have Driver (ride ) / caregiver    for 24 hours after surgery -- sig other, [name] and sister, Alberteen Spindle Patient Special Instructions ----- no order given for fleet enema and sig other stated was given instructions from office to do fleet enema Pre-Op special Istructions ----- pt has cardiac telephone office visit clearance by Diona Browner NP on 02-21-2022 in epic/ chart  Pt uses wheelchair, can stand and pivot with minimal assist, uses depends, unable to write (he can write an "x").  Pt sig other, [name], and his sister, Juventino Slovak, will be with him dos.   Pt does have POA/ HCPOA. He able to communicate.  Patient sig other , Maudie Mercury , verbalized understanding of instructions that were given at this phone interview. Patient sig other stated pt does not have shortness of breath, chest pain, fever, cough at this phone interview.    Anesthesia Review:   hx STEMI 07-22-2020 cardiac arrest resuscitated by EMS, post DES to mCFx, ICM (last ef 55-60%), anoxic brain injury w/ residual memory impairment, gait instability, unable to write, speech issue.    PCP:  [facility] Cardiologist : Dr Gasper Sells Cass[facility] Clement 02-21-2022) Chest x-ray : 04-06-2021 EKG : 10-31-2021 Echo : 08-06-2021 Stress test: no Cardiac Cath :  07-22-2020 Activity level:   wheelchair Sleep Study/ CPAP : no  Blood Thinner/ Instructions Maryjane Hurter Dose: Plavix ASA / Instructions/ Last Dose :  ASA '81mg'$  Per sig other, [name], was given instructions from cardiology to stop 5 days prior to surgery , stated last dose 02-21-2022 and continue asa.

## 2022-02-25 ENCOUNTER — Telehealth: Payer: Self-pay | Admitting: *Deleted

## 2022-02-25 NOTE — Telephone Encounter (Signed)
CALLED PATIENT TO REMIND OF SIM FOR 02-28-22- ARRIVAL TIME- 8:45 AM @ [facility], INFORMED PATIENT TO ARRIVE WITH A FULL BLADDER AND AN EMPTY BOWEL, SPOKE WITH PATIENT'S SGO - [facility] IS AWARE OF THIS APPT. AND THE INSTRUCTIONS

## 2022-02-26 ENCOUNTER — Ambulatory Visit (HOSPITAL_BASED_OUTPATIENT_CLINIC_OR_DEPARTMENT_OTHER): Payer: Medicaid Other | Admitting: Anesthesiology

## 2022-02-26 ENCOUNTER — Encounter (HOSPITAL_BASED_OUTPATIENT_CLINIC_OR_DEPARTMENT_OTHER): Payer: Self-pay | Admitting: Urology

## 2022-02-26 ENCOUNTER — Other Ambulatory Visit: Payer: Self-pay

## 2022-02-26 ENCOUNTER — Ambulatory Visit (HOSPITAL_BASED_OUTPATIENT_CLINIC_OR_DEPARTMENT_OTHER)
Admission: RE | Admit: 2022-02-26 | Discharge: 2022-02-26 | Disposition: A | Discharge status: Home / Self Care | Payer: Medicaid Other | Attending: Urology | Admitting: Urology

## 2022-02-26 ENCOUNTER — Encounter (HOSPITAL_BASED_OUTPATIENT_CLINIC_OR_DEPARTMENT_OTHER)
Admission: RE | Disposition: A | Discharge status: Home / Self Care | Payer: Self-pay | Source: Home / Self Care | Attending: Urology

## 2022-02-26 DIAGNOSIS — Z8669 Personal history of other diseases of the nervous system and sense organs: Secondary | ICD-10-CM | POA: Insufficient documentation

## 2022-02-26 DIAGNOSIS — Z01818 Encounter for other preprocedural examination: Secondary | ICD-10-CM

## 2022-02-26 DIAGNOSIS — I252 Old myocardial infarction: Secondary | ICD-10-CM

## 2022-02-26 DIAGNOSIS — Z87891 Personal history of nicotine dependence: Secondary | ICD-10-CM | POA: Diagnosis not present

## 2022-02-26 DIAGNOSIS — C61 Malignant neoplasm of prostate: Secondary | ICD-10-CM | POA: Diagnosis not present

## 2022-02-26 DIAGNOSIS — I2510 Atherosclerotic heart disease of native coronary artery without angina pectoris: Secondary | ICD-10-CM | POA: Diagnosis not present

## 2022-02-26 DIAGNOSIS — I10 Essential (primary) hypertension: Secondary | ICD-10-CM | POA: Diagnosis not present

## 2022-02-26 HISTORY — DX: Unspecified speech disturbances: R47.9

## 2022-02-26 HISTORY — DX: Nocturia: R35.1

## 2022-02-26 HISTORY — DX: Other amnesia: R41.3

## 2022-02-26 HISTORY — PX: GOLD SEED IMPLANT: SHX6343

## 2022-02-26 HISTORY — DX: Unspecified urinary incontinence: R32

## 2022-02-26 HISTORY — DX: Dependence on wheelchair: Z99.3

## 2022-02-26 HISTORY — PX: SPACE OAR INSTILLATION: SHX6769

## 2022-02-26 SURGERY — INSERTION, GOLD SEEDS
Anesthesia: Monitor Anesthesia Care | Site: Prostate

## 2022-02-26 MED ORDER — CEFAZOLIN SODIUM-DEXTROSE 2-4 GM/100ML-% IV SOLN
2.0000 g | INTRAVENOUS | Status: AC
  Administered 2022-02-26: 2 g via INTRAVENOUS

## 2022-02-26 MED ORDER — FENTANYL CITRATE (PF) 100 MCG/2ML IJ SOLN: 25.0000 ug | INTRAMUSCULAR | Status: DC | PRN

## 2022-02-26 MED ORDER — ACETAMINOPHEN 500 MG PO TABS
1000.0000 mg | ORAL_TABLET | Freq: Once | ORAL | Status: AC
  Administered 2022-02-26: 500 mg via ORAL

## 2022-02-26 MED ORDER — ONDANSETRON HCL 4 MG/2ML IJ SOLN
INTRAMUSCULAR | Status: DC | PRN
  Administered 2022-02-26: 4 mg via INTRAVENOUS

## 2022-02-26 MED ORDER — ACETAMINOPHEN 500 MG PO TABS
ORAL_TABLET | ORAL | Status: AC
  Filled 2022-02-26: qty 2

## 2022-02-26 MED ORDER — PROPOFOL 1000 MG/100ML IV EMUL
INTRAVENOUS | Status: AC
  Filled 2022-02-26: qty 100

## 2022-02-26 MED ORDER — ONDANSETRON HCL 4 MG/2ML IJ SOLN
INTRAMUSCULAR | Status: AC
  Filled 2022-02-26: qty 2

## 2022-02-26 MED ORDER — CEFAZOLIN SODIUM-DEXTROSE 2-4 GM/100ML-% IV SOLN
INTRAVENOUS | Status: AC
  Filled 2022-02-26: qty 100

## 2022-02-26 MED ORDER — FENTANYL CITRATE (PF) 100 MCG/2ML IJ SOLN
INTRAMUSCULAR | Status: DC | PRN
  Administered 2022-02-26: 50 ug via INTRAVENOUS

## 2022-02-26 MED ORDER — LIDOCAINE HCL 2 % IJ SOLN
INTRAMUSCULAR | Status: DC | PRN
  Administered 2022-02-26: 10 mL

## 2022-02-26 MED ORDER — FENTANYL CITRATE (PF) 100 MCG/2ML IJ SOLN
INTRAMUSCULAR | Status: AC
  Filled 2022-02-26: qty 2

## 2022-02-26 MED ORDER — ONDANSETRON HCL 4 MG/2ML IJ SOLN: 4.0000 mg | Freq: Once | INTRAMUSCULAR | Status: DC | PRN

## 2022-02-26 MED ORDER — LACTATED RINGERS IV SOLN: INTRAVENOUS | Status: DC

## 2022-02-26 MED ORDER — PROPOFOL 500 MG/50ML IV EMUL
INTRAVENOUS | Status: DC | PRN
  Administered 2022-02-26: 200 ug/kg/min via INTRAVENOUS

## 2022-02-26 MED ORDER — MIDAZOLAM HCL 2 MG/2ML IJ SOLN
INTRAMUSCULAR | Status: AC
  Filled 2022-02-26: qty 2

## 2022-02-26 MED ORDER — MIDAZOLAM HCL 5 MG/5ML IJ SOLN
INTRAMUSCULAR | Status: DC | PRN
  Administered 2022-02-26: 1 mg via INTRAVENOUS

## 2022-02-26 MED ORDER — SODIUM CHLORIDE (PF) 0.9 % IJ SOLN
INTRAMUSCULAR | Status: DC | PRN
  Administered 2022-02-26: 10 mL

## 2022-02-26 SURGICAL SUPPLY — 24 items
BLADE CLIPPER SENSICLIP SURGIC (BLADE) ×2 IMPLANT
CNTNR URN SCR LID CUP LEK RST (MISCELLANEOUS) ×1 IMPLANT
CONT SPEC 4OZ STRL OR WHT (MISCELLANEOUS) ×2
COVER BACK TABLE 60X90IN (DRAPES) ×2 IMPLANT
DRSG TEGADERM 4X4.75 (GAUZE/BANDAGES/DRESSINGS) ×2 IMPLANT
DRSG TEGADERM 8X12 (GAUZE/BANDAGES/DRESSINGS) ×2 IMPLANT
GAUZE SPONGE 4X4 12PLY STRL (GAUZE/BANDAGES/DRESSINGS) ×2 IMPLANT
GLOVE BIO SURGEON STRL SZ7.5 (GLOVE) ×2 IMPLANT
GLOVE SURG ORTHO 8.5 STRL (GLOVE) ×2 IMPLANT
IMPL SPACEOAR VUE SYSTEM (Spacer) ×1 IMPLANT
IMPLANT SPACEOAR VUE SYSTEM (Spacer) ×2 IMPLANT
KIT TURNOVER CYSTO (KITS) ×2 IMPLANT
MARKER GOLD PRELOAD 1.2X3 (Urological Implant) ×1 IMPLANT
MARKER SKIN DUAL TIP RULER LAB (MISCELLANEOUS) ×2 IMPLANT
NDL SPNL 22GX3.5 QUINCKE BK (NEEDLE) ×1 IMPLANT
NEEDLE SPNL 22GX3.5 QUINCKE BK (NEEDLE) ×2 IMPLANT
SEED GOLD PRELOAD 1.2X3 (Urological Implant) ×2 IMPLANT
SHEATH ULTRASOUND LF (SHEATH) ×1 IMPLANT
SHEATH ULTRASOUND LTX NONSTRL (SHEATH) ×1 IMPLANT
SURGILUBE 2OZ TUBE FLIPTOP (MISCELLANEOUS) ×2 IMPLANT
SYR 10ML LL (SYRINGE) ×1 IMPLANT
SYR CONTROL 10ML LL (SYRINGE) ×2 IMPLANT
TOWEL OR 17X26 10 PK STRL BLUE (TOWEL DISPOSABLE) ×2 IMPLANT
UNDERPAD 30X36 HEAVY ABSORB (UNDERPADS AND DIAPERS) ×2 IMPLANT

## 2022-02-26 NOTE — Anesthesia Preprocedure Evaluation (Signed)
Anesthesia Evaluation  Patient identified by MRN, date of birth, ID band Patient awake    Reviewed: Allergy & Precautions, NPO status , Patient's Chart, lab work & pertinent test results, reviewed documented beta blocker date and time   Airway Mallampati: II  TM Distance: >3 FB Neck ROM: Full    Dental  (+) Teeth Intact, Dental Advisory Given   Pulmonary neg pulmonary ROS, former smoker,    Pulmonary exam normal breath sounds clear to auscultation       Cardiovascular hypertension, Pt. on home beta blockers and Pt. on medications + CAD, + Past MI and + Cardiac Stents  Normal cardiovascular exam Rhythm:Regular Rate:Normal     Neuro/Psych PSYCHIATRIC DISORDERS Depression H/o anoxic brain injury    GI/Hepatic Neg liver ROS, GERD  ,  Endo/Other  negative endocrine ROS  Renal/GU negative Renal ROS Bladder dysfunction  Prostate cancer     Musculoskeletal negative musculoskeletal ROS (+)   Abdominal   Peds  Hematology  (+) Blood dyscrasia (Plavix), ,   Anesthesia Other Findings Day of surgery medications reviewed with the patient.  Reproductive/Obstetrics                             Anesthesia Physical Anesthesia Plan  ASA: 3  Anesthesia Plan: MAC   Post-op Pain Management: Tylenol PO (pre-op)*   Induction: Intravenous  PONV Risk Score and Plan: 1 and TIVA and Treatment may vary due to age or medical condition  Airway Management Planned: Natural Airway and Nasal Cannula  Additional Equipment:   Intra-op Plan:   Post-operative Plan:   Informed Consent: I have reviewed the patients History and Physical, chart, labs and discussed the procedure including the risks, benefits and alternatives for the proposed anesthesia with the patient or authorized representative who has indicated his/her understanding and acceptance.     Dental advisory given  Plan Discussed with: CRNA,  Anesthesiologist and Surgeon  Anesthesia Plan Comments:         Anesthesia Quick Evaluation

## 2022-02-26 NOTE — Anesthesia Postprocedure Evaluation (Signed)
Anesthesia Post Note  Patient: Antonio Navarro  Procedure(s) Performed: GOLD SEED IMPLANT (Prostate) SPACE OAR INSTILLATION (Prostate)     Patient location during evaluation: PACU Anesthesia Type: MAC Level of consciousness: awake and alert Pain management: pain level controlled Vital Signs Assessment: post-procedure vital signs reviewed and stable Respiratory status: spontaneous breathing, nonlabored ventilation, respiratory function stable and patient connected to nasal cannula oxygen Cardiovascular status: stable and blood pressure returned to baseline Postop Assessment: no apparent nausea or vomiting Anesthetic complications: no   No notable events documented.  Last Vitals:  Vitals:   02/26/22 1215 02/26/22 1245  BP: (!) 168/111 (!) 154/93  Pulse: (!) 54 (!) 54  Resp: (!) 9 10  Temp:    SpO2: 100% 100%    Last Pain:  Vitals:   02/26/22 1215  TempSrc:   PainSc: [name]

## 2022-02-26 NOTE — Op Note (Signed)
Operative Note   Preoperative diagnosis:  1.  Grade 5 prostate cancer   Postoperative diagnosis: 1.  Same    Procedure(s): 1.  Transrectal ultrasound guided prostatic gold seed fiducial marker and Space OAR placement   Surgeon: Ellison Hughs, MD   Assistants:  None    Anesthesia:  MAC   Complications:  None   EBL:  <5 mL   Specimens: 1. None   Drains/Catheters: 1.  None   Intraoperative findings:   Fiducial markers were placed at the prostatic base, mid-gland and apex.  SpaceOAR was placed with good separation of the prostate and rectum.    Indication: Mr. Antonio Navarro is a 61 y.o. male followed by Dr. Cain Sieve with high-volume grade 5 prostate cancer.  He has a significant past medical history of an anoxic brain injury.  He has been consented for the above procedures, voices understanding and wishes to proceed.   Description of procedure:   After informed consent the patient was brought to the major OR, placed on the table and administered general anesthesia. He was then moved to the modified lithotomy position with his perineum perpendicular to the floor. His perineum and genitalia were then sterilely prepped. An official timeout was then performed.    Real time transrectal ultrasonography was used visualize the prostate.  Gold seed fiducial markers were then placed transperineally at the prostatic base, mid gland and apex.   I then proceeded with placement of SpaceOAR by introducing a needle with the bevel angled inferiorly approximately 2 cm superior to the anus. This was angled downward and under direct ultrasound was placed within the space between the prostatic capsule and rectum. This was confirmed with a small amount of sterile saline injected and this was performed under direct ultrasound. I then attached the SpaceOAR to the needle and injected this in the space between the prostate and rectum with good placement noted.  The patient tolerated the procedure well and was  transferred to the postanesthesia in stable condition.   Plan: Follow-up in 4 months with PSA

## 2022-02-26 NOTE — Anesthesia Procedure Notes (Signed)
Procedure Name: MAC Date/Time: 02/26/2022 11:36 AM  Performed by: Rogers Blocker, CRNAPre-anesthesia Checklist: Timeout performed, Patient being monitored, Suction available, Emergency Drugs available and Patient identified Patient Re-evaluated:Patient Re-evaluated prior to induction Oxygen Delivery Method: Simple face mask Preoxygenation: Pre-oxygenation with 100% oxygen Induction Type: IV induction Placement Confirmation: breath sounds checked- equal and bilateral, positive ETCO2 and CO2 detector

## 2022-02-26 NOTE — H&P (Signed)
Urology Preoperative H&P   Chief Complaint: Prostate cancer  History of Present Illness: Antonio Navarro is a 61 y.o. male followed by Dr. Cain Sieve with grade 5 prostate cancer.  He has a significant PMHx of anoxic brain injury following recent cardiac catheterization.  He has elected to proceed with external beam radiation therapy as primary treatment of his prostate cancer.  He is here today for gold seed fiducial marker and SpaceOAR placement.  Leading up to surgery, the patient reports that he is incontinent and denies interval UTIs, dysuria or hematuria.  Past Medical History:  Diagnosis Date   Anoxic brain injury ([place]) 07/22/2020   cardiac arrest for 10 minutes prior to resuscitationed by EMS , residual unable to write, gait instability (uses wheelchair) has speech impediment but can communicate   CAD (coronary artery disease), native coronary artery 07/2020   cardiologist--- dr Gasper Sells;    07-22-2020 s/p cardiac arrest in setting of STEMI with cath showing 40%pRCA and 95% mLCx ,  DES x1 to mLCx   Dysphagia    intermittant   Family history of adverse reaction to anesthesia    sister--  hard to wake   Gait instability    residual from brain injury ,  uses wheelchair   GERD (gastroesophageal reflux disease)    History of kidney stones    History of ST elevation myocardial infarction (STEMI) 07/22/2020   inferolateral ---  VDRF  shocked 6 times by EMS, intubated,  ef 40%,  post PCI Stent to CFx, 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;   pt had prolonged hospitalization   HLD (hyperlipidemia)    Hypertension    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   Ischemic cardiomyopathy 07/2020   07-22-2020  EF 40% at time of MI;   08-06-2021  ef 55-60% per echo   Malignant neoplasm prostate [facility]) 10/2021   primary urologist-- dr borden/  oncologist--- dr shadad/  radiation oncology-- dr Tammi Klippel;  dx 03/ 2023,  Gleason 5+4, PSA 10-81   Nocturia    S/P drug eluting  coronary stent placement 07/22/2021   DES x 1 to midCFx   Speech problem    pt is able to communicate needs but has a little impediment   Urinary incontinence    uses depends   Uses wheelchair    uses wheelchair due to gait instability,  can stand and pivot with minminal assist    Past Surgical History:  Procedure Laterality Date   CORONARY/GRAFT ACUTE MI REVASCULARIZATION N/A 07/22/2020   Procedure: Coronary/Graft Acute MI Revascularization;  Surgeon: Lorretta Harp, MD;  Location: [facility];  Service: Cardiovascular;  Laterality: N/A;   IR GASTROSTOMY TUBE MOD SED  08/17/2020   IR GASTROSTOMY TUBE REMOVAL  11/08/2020   LEFT HEART CATH AND CORONARY ANGIOGRAPHY N/A 07/22/2020   Procedure: LEFT HEART CATH AND CORONARY ANGIOGRAPHY;  Surgeon: Lorretta Harp, MD;  Location: [place] [facility];  Service: Cardiovascular;  Laterality: N/A;   PROSTATE BIOPSY N/A 11/13/2021   Procedure: BIOPSY TRANSRECTAL ULTRASONIC PROSTATE (TUBP);  Surgeon: Vira Agar, MD;  Location: [facility];  Service: Urology;  Laterality: N/A;  35 MINS FOR THIS CASE   RADIOLOGY WITH ANESTHESIA N/A 10/16/2021   Procedure: MRI WITH ANESTHESIA OF PROSTATE WITH AND WITHOUT CONTRAST;  Surgeon: Radiologist, Medication, MD;  Location: [facility];  Service: Radiology;  Laterality: N/A;    Allergies: No Known Allergies  Family History  Problem Relation Age of Onset  Cancer Mother        Passed at age 59   Hypertension Father    Congestive Heart Failure Father    Breast cancer Sister    Diabetes type II Sister    Hypertension Sister    Hypertension Brother    Autism spectrum disorder Brother     Social History:  reports that he quit smoking about 19 months ago. His smoking use included cigarettes. He has a 40.00 pack-year smoking history. He has never used smokeless tobacco. He reports that he does not currently use alcohol. He reports that he does not currently use drugs after having used the following drugs:  Marijuana.  ROS: A complete review of systems was performed.  All systems are negative except for pertinent findings as noted.  Physical Exam:  Vital signs in last 24 hours: Temp:  [97.1 F (36.2 C)] 97.1 F (36.2 C) (07/11 0851) Pulse Rate:  [75] 75 (07/11 0851) Resp:  [16] 16 (07/11 0851) BP: (142-146)/(95-106) 142/95 (07/11 0903) SpO2:  [100 %] 100 % (07/11 0851) Weight:  [71.2 kg] 71.2 kg (07/11 0851) Constitutional:  Alert.  No acute distress.  His sister and fiancee are at bedside Cardiovascular: Regular rate and rhythm, No JVD Respiratory: Normal respiratory effort, Lungs clear bilaterally GI: Abdomen is soft, nontender, nondistended, no abdominal masses  Laboratory Data:  No results for input(s): "WBC", "HGB", "HCT", "PLT" in the last 72 hours.  No results for input(s): "NA", "K", "CL", "GLUCOSE", "BUN", "CALCIUM", "CREATININE" in the last 72 hours.  Invalid input(s): "CO3"   No results found for this or any previous visit (from the past 24 hour(s)). No results found for this or any previous visit (from the past 240 hour(s)).  Renal Function: No results for input(s): "CREATININE" in the last 168 hours. Estimated Creatinine Clearance: 70.3 mL/min (by C-G formula based on SCr of 1.14 mg/dL).  Radiologic Imaging: No results found.  I independently reviewed the above imaging studies.  Assessment and Plan Antonio Navarro is a 61 y.o. male with high volume grade 5 prostate cancer   The risk, benefits and alternatives of gold seed fiducial marker and SpaceOAR placement was discussed with the patient.  Risk include, but are not limited to, bleeding, urinary tract infection, perineal infection, urethral injury, rectal irritation/ulceration, MI, CVA, DVT and the inherent risk of general anesthesia.  He voices understanding and wishes to proceed.  Ellison Hughs, MD 02/26/2022, 11:02 AM  [facility] Pager: [phone]

## 2022-02-26 NOTE — Transfer of Care (Signed)
Immediate Anesthesia Transfer of Care Note  Patient: Antonio Navarro  Procedure(s) Performed: GOLD SEED IMPLANT (Prostate) SPACE OAR INSTILLATION (Prostate)  Patient Location:PACU  Anesthesia Type:MAC  Level of Consciousness: drowsy  Airway & Oxygen Therapy: O2 via FM with oral airway remaining  Post-op Assessment: . No apparent complications.  Post vital signs: VSS.  Last Vitals:  Vitals Value Taken Time  BP 133/96 02/26/22 1148  Temp 36.3 C 02/26/22 1148  Pulse 57 02/26/22 1152  Resp 11 02/26/22 1152  SpO2 100 % 02/26/22 1152  Vitals shown include unvalidated device data.  Last Pain:  Vitals:   02/26/22 0851  TempSrc: Oral  PainSc: 0-No pain      Patients Stated Pain Goal: 3 (35/70/17 7939)  Complications: No notable events documented.

## 2022-02-26 NOTE — Anesthesia Procedure Notes (Signed)
Procedure Name: MAC Date/Time: 02/26/2022 11:16 AM  Performed by: Rogers Blocker, CRNAPre-anesthesia Checklist: Patient identified, Emergency Drugs available, Suction available, Patient being monitored and Timeout performed Oxygen Delivery Method: Simple face mask Placement Confirmation: positive ETCO2 and breath sounds checked- equal and bilateral

## 2022-02-26 NOTE — Discharge Instructions (Signed)

## 2022-02-27 ENCOUNTER — Encounter (HOSPITAL_BASED_OUTPATIENT_CLINIC_OR_DEPARTMENT_OTHER): Payer: Self-pay | Admitting: Urology

## 2022-02-28 ENCOUNTER — Ambulatory Visit
Admission: RE | Admit: 2022-02-28 | Discharge: 2022-02-28 | Disposition: A | Discharge status: Home / Self Care | Payer: Medicaid Other | Source: Ambulatory Visit | Attending: Radiation Oncology | Admitting: Radiation Oncology

## 2022-02-28 ENCOUNTER — Other Ambulatory Visit: Payer: Self-pay

## 2022-02-28 DIAGNOSIS — C61 Malignant neoplasm of prostate: Secondary | ICD-10-CM | POA: Insufficient documentation

## 2022-02-28 DIAGNOSIS — Z510 Encounter for antineoplastic radiation therapy: Secondary | ICD-10-CM | POA: Insufficient documentation

## 2022-02-28 NOTE — Progress Notes (Signed)
  Radiation Oncology         [phone] ________________________________  Name: Antonio Navarro MRN: [id number]  Date: 02/28/2022  DOB: 28-Aug-1960  SIMULATION AND TREATMENT PLANNING NOTE    ICD-10-CM   1. Malignant neoplasm of prostate ([place])  C61       DIAGNOSIS:  61 y.o. 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  NARRATIVE:  The patient was brought to the [facility].  Identity was confirmed.  All relevant records and images related to the planned course of therapy were reviewed.  The patient freely provided informed written consent to proceed with treatment after reviewing the details related to the planned course of therapy. The consent form was witnessed and verified by the simulation staff.  Then, the patient was set-up in a stable reproducible supine position for radiation therapy.  A vacuum lock pillow device was custom fabricated to position his legs in a reproducible immobilized position.  Then, I performed a urethrogram under sterile conditions to identify the prostatic bed.  CT images were obtained.  Surface markings were placed.  The CT images were loaded into the planning software.  Then the prostate bed target, pelvic lymph node target and avoidance structures including the rectum, bladder, bowel and hips were contoured.  Treatment planning then occurred.  The radiation prescription was entered and confirmed.  A total of one complex treatment devices were fabricated. I have requested : Intensity Modulated Radiotherapy (IMRT) is medically necessary for this case for the following reason:  Rectal sparing.Marland Kitchen  PLAN:  The patient will receive 45 Gy in 25 fractions of 1.8 Gy, followed by a boost to the prostate and PET-positive nodes to a total dose of 75 Gy with 15 additional fractions of 2 Gy.   ________________________________  Sheral Apley Tammi Klippel, M.D.

## 2022-03-07 DIAGNOSIS — Z510 Encounter for antineoplastic radiation therapy: Secondary | ICD-10-CM | POA: Diagnosis not present

## 2022-03-11 ENCOUNTER — Other Ambulatory Visit: Payer: Self-pay

## 2022-03-11 ENCOUNTER — Ambulatory Visit
Admission: RE | Admit: 2022-03-11 | Discharge: 2022-03-11 | Disposition: A | Discharge status: Home / Self Care | Payer: Medicaid Other | Source: Ambulatory Visit | Attending: Radiation Oncology | Admitting: Radiation Oncology

## 2022-03-11 DIAGNOSIS — Z510 Encounter for antineoplastic radiation therapy: Secondary | ICD-10-CM | POA: Diagnosis not present

## 2022-03-11 LAB — RAD ONC ARIA SESSION SUMMARY
Course Elapsed Days: 0
Plan Fractions Treated to Date: 1
Plan Prescribed Dose Per Fraction: 1.8 Gy
Plan Total Fractions Prescribed: 25
Plan Total Prescribed Dose: 45 Gy
Reference Point Dosage Given to Date: 1.8 Gy
Reference Point Session Dosage Given: 1.8 Gy
Session Number: 1

## 2022-03-12 ENCOUNTER — Ambulatory Visit
Admission: RE | Admit: 2022-03-12 | Discharge: 2022-03-12 | Disposition: A | Discharge status: Home / Self Care | Payer: Medicaid Other | Source: Ambulatory Visit | Attending: Radiation Oncology | Admitting: Radiation Oncology

## 2022-03-12 ENCOUNTER — Other Ambulatory Visit: Payer: Self-pay

## 2022-03-12 DIAGNOSIS — Z510 Encounter for antineoplastic radiation therapy: Secondary | ICD-10-CM | POA: Diagnosis not present

## 2022-03-12 LAB — RAD ONC ARIA SESSION SUMMARY
Course Elapsed Days: 1
Plan Fractions Treated to Date: 2
Plan Prescribed Dose Per Fraction: 1.8 Gy
Plan Total Fractions Prescribed: 25
Plan Total Prescribed Dose: 45 Gy
Reference Point Dosage Given to Date: 3.6 Gy
Reference Point Session Dosage Given: 1.8 Gy
Session Number: 2

## 2022-03-13 ENCOUNTER — Ambulatory Visit
Admission: RE | Admit: 2022-03-13 | Discharge: 2022-03-13 | Disposition: A | Discharge status: Home / Self Care | Payer: Medicaid Other | Source: Ambulatory Visit | Attending: Radiation Oncology | Admitting: Radiation Oncology

## 2022-03-13 ENCOUNTER — Other Ambulatory Visit: Payer: Self-pay

## 2022-03-13 DIAGNOSIS — Z510 Encounter for antineoplastic radiation therapy: Secondary | ICD-10-CM | POA: Diagnosis not present

## 2022-03-13 LAB — RAD ONC ARIA SESSION SUMMARY
Course Elapsed Days: 2
Plan Fractions Treated to Date: 3
Plan Prescribed Dose Per Fraction: 1.8 Gy
Plan Total Fractions Prescribed: 25
Plan Total Prescribed Dose: 45 Gy
Reference Point Dosage Given to Date: 5.4 Gy
Reference Point Session Dosage Given: 1.8 Gy
Session Number: 3

## 2022-03-14 ENCOUNTER — Other Ambulatory Visit: Payer: Self-pay

## 2022-03-14 ENCOUNTER — Ambulatory Visit
Admission: RE | Admit: 2022-03-14 | Discharge: 2022-03-14 | Disposition: A | Discharge status: Home / Self Care | Payer: Medicaid Other | Source: Ambulatory Visit | Attending: Radiation Oncology | Admitting: Radiation Oncology

## 2022-03-14 DIAGNOSIS — Z510 Encounter for antineoplastic radiation therapy: Secondary | ICD-10-CM | POA: Diagnosis not present

## 2022-03-14 LAB — RAD ONC ARIA SESSION SUMMARY
Course Elapsed Days: 3
Plan Fractions Treated to Date: 4
Plan Prescribed Dose Per Fraction: 1.8 Gy
Plan Total Fractions Prescribed: 25
Plan Total Prescribed Dose: 45 Gy
Reference Point Dosage Given to Date: 7.2 Gy
Reference Point Session Dosage Given: 1.8 Gy
Session Number: 4

## 2022-03-15 ENCOUNTER — Ambulatory Visit
Admission: RE | Admit: 2022-03-15 | Discharge: 2022-03-15 | Disposition: A | Discharge status: Home / Self Care | Payer: Medicaid Other | Source: Ambulatory Visit | Attending: Radiation Oncology | Admitting: Radiation Oncology

## 2022-03-15 ENCOUNTER — Ambulatory Visit: Admission: RE | Admit: 2022-03-15 | Payer: Medicaid Other | Source: Ambulatory Visit

## 2022-03-15 ENCOUNTER — Other Ambulatory Visit: Payer: Self-pay

## 2022-03-15 DIAGNOSIS — Z510 Encounter for antineoplastic radiation therapy: Secondary | ICD-10-CM | POA: Diagnosis not present

## 2022-03-15 LAB — RAD ONC ARIA SESSION SUMMARY
Course Elapsed Days: 4
Plan Fractions Treated to Date: 5
Plan Prescribed Dose Per Fraction: 1.8 Gy
Plan Total Fractions Prescribed: 25
Plan Total Prescribed Dose: 45 Gy
Reference Point Dosage Given to Date: 9 Gy
Reference Point Session Dosage Given: 1.8 Gy
Session Number: 5

## 2022-03-18 ENCOUNTER — Ambulatory Visit: Payer: Medicaid Other

## 2022-03-19 ENCOUNTER — Other Ambulatory Visit: Payer: Self-pay

## 2022-03-19 ENCOUNTER — Ambulatory Visit
Admission: RE | Admit: 2022-03-19 | Discharge: 2022-03-19 | Disposition: A | Discharge status: Home / Self Care | Payer: Medicaid Other | Source: Ambulatory Visit | Attending: Radiation Oncology | Admitting: Radiation Oncology

## 2022-03-19 DIAGNOSIS — C61 Malignant neoplasm of prostate: Secondary | ICD-10-CM | POA: Insufficient documentation

## 2022-03-19 DIAGNOSIS — Z510 Encounter for antineoplastic radiation therapy: Secondary | ICD-10-CM | POA: Diagnosis not present

## 2022-03-19 LAB — RAD ONC ARIA SESSION SUMMARY
Course Elapsed Days: 8
Plan Fractions Treated to Date: 6
Plan Prescribed Dose Per Fraction: 1.8 Gy
Plan Total Fractions Prescribed: 25
Plan Total Prescribed Dose: 45 Gy
Reference Point Dosage Given to Date: 10.8 Gy
Reference Point Session Dosage Given: 1.8 Gy
Session Number: 6

## 2022-03-20 ENCOUNTER — Ambulatory Visit: Payer: Medicaid Other

## 2022-03-21 ENCOUNTER — Other Ambulatory Visit: Payer: Self-pay

## 2022-03-21 ENCOUNTER — Ambulatory Visit
Admission: RE | Admit: 2022-03-21 | Discharge: 2022-03-21 | Disposition: A | Discharge status: Home / Self Care | Payer: Medicaid Other | Source: Ambulatory Visit | Attending: Radiation Oncology | Admitting: Radiation Oncology

## 2022-03-21 DIAGNOSIS — Z510 Encounter for antineoplastic radiation therapy: Secondary | ICD-10-CM | POA: Diagnosis not present

## 2022-03-21 LAB — RAD ONC ARIA SESSION SUMMARY
Course Elapsed Days: 10
Plan Fractions Treated to Date: 7
Plan Prescribed Dose Per Fraction: 1.8 Gy
Plan Total Fractions Prescribed: 25
Plan Total Prescribed Dose: 45 Gy
Reference Point Dosage Given to Date: 12.6 Gy
Reference Point Session Dosage Given: 1.8 Gy
Session Number: 7

## 2022-03-22 ENCOUNTER — Ambulatory Visit
Admission: RE | Admit: 2022-03-22 | Discharge: 2022-03-22 | Disposition: A | Discharge status: Home / Self Care | Payer: Medicaid Other | Source: Ambulatory Visit | Attending: Radiation Oncology | Admitting: Radiation Oncology

## 2022-03-22 ENCOUNTER — Other Ambulatory Visit: Payer: Self-pay

## 2022-03-22 DIAGNOSIS — Z510 Encounter for antineoplastic radiation therapy: Secondary | ICD-10-CM | POA: Diagnosis not present

## 2022-03-22 LAB — RAD ONC ARIA SESSION SUMMARY
Course Elapsed Days: 11
Plan Fractions Treated to Date: 8
Plan Prescribed Dose Per Fraction: 1.8 Gy
Plan Total Fractions Prescribed: 25
Plan Total Prescribed Dose: 45 Gy
Reference Point Dosage Given to Date: 14.4 Gy
Reference Point Session Dosage Given: 1.8 Gy
Session Number: 8

## 2022-03-24 ENCOUNTER — Other Ambulatory Visit: Payer: Self-pay | Admitting: Radiation Oncology

## 2022-03-24 DIAGNOSIS — C61 Malignant neoplasm of prostate: Secondary | ICD-10-CM

## 2022-03-24 NOTE — Addendum Note (Signed)
Encounter addended by: Tyler Pita, MD on: 03/24/2022 4:09 PM  Actions taken: Problem List reviewed, Medication List reviewed, Allergies reviewed

## 2022-03-25 ENCOUNTER — Other Ambulatory Visit: Payer: Self-pay

## 2022-03-25 ENCOUNTER — Telehealth: Payer: Self-pay | Admitting: Genetic Counselor

## 2022-03-25 ENCOUNTER — Ambulatory Visit
Admission: RE | Admit: 2022-03-25 | Discharge: 2022-03-25 | Disposition: A | Discharge status: Home / Self Care | Payer: Medicaid Other | Source: Ambulatory Visit | Attending: Radiation Oncology | Admitting: Radiation Oncology

## 2022-03-25 DIAGNOSIS — Z510 Encounter for antineoplastic radiation therapy: Secondary | ICD-10-CM | POA: Diagnosis not present

## 2022-03-25 LAB — RAD ONC ARIA SESSION SUMMARY
Course Elapsed Days: 14
Plan Fractions Treated to Date: 9
Plan Prescribed Dose Per Fraction: 1.8 Gy
Plan Total Fractions Prescribed: 25
Plan Total Prescribed Dose: 45 Gy
Reference Point Dosage Given to Date: 16.2 Gy
Reference Point Session Dosage Given: 1.8 Gy
Session Number: 9

## 2022-03-25 NOTE — Telephone Encounter (Signed)
Scheduled per 8/7 in basket, pt wife has been called and confirmed

## 2022-03-26 ENCOUNTER — Other Ambulatory Visit: Payer: Self-pay

## 2022-03-26 ENCOUNTER — Ambulatory Visit
Admission: RE | Admit: 2022-03-26 | Discharge: 2022-03-26 | Disposition: A | Discharge status: Home / Self Care | Payer: Medicaid Other | Source: Ambulatory Visit | Attending: Radiation Oncology | Admitting: Radiation Oncology

## 2022-03-26 DIAGNOSIS — Z510 Encounter for antineoplastic radiation therapy: Secondary | ICD-10-CM | POA: Diagnosis not present

## 2022-03-26 LAB — RAD ONC ARIA SESSION SUMMARY
Course Elapsed Days: 15
Plan Fractions Treated to Date: 10
Plan Prescribed Dose Per Fraction: 1.8 Gy
Plan Total Fractions Prescribed: 25
Plan Total Prescribed Dose: 45 Gy
Reference Point Dosage Given to Date: 18 Gy
Reference Point Session Dosage Given: 1.8 Gy
Session Number: 10

## 2022-03-27 ENCOUNTER — Other Ambulatory Visit: Payer: Self-pay

## 2022-03-27 ENCOUNTER — Ambulatory Visit
Admission: RE | Admit: 2022-03-27 | Discharge: 2022-03-27 | Disposition: A | Discharge status: Home / Self Care | Payer: Medicaid Other | Source: Ambulatory Visit | Attending: Radiation Oncology | Admitting: Radiation Oncology

## 2022-03-27 DIAGNOSIS — Z510 Encounter for antineoplastic radiation therapy: Secondary | ICD-10-CM | POA: Diagnosis not present

## 2022-03-27 LAB — RAD ONC ARIA SESSION SUMMARY
Course Elapsed Days: 16
Plan Fractions Treated to Date: 11
Plan Prescribed Dose Per Fraction: 1.8 Gy
Plan Total Fractions Prescribed: 25
Plan Total Prescribed Dose: 45 Gy
Reference Point Dosage Given to Date: 19.8 Gy
Reference Point Session Dosage Given: 1.8 Gy
Session Number: 11

## 2022-03-28 ENCOUNTER — Other Ambulatory Visit: Payer: Self-pay

## 2022-03-28 ENCOUNTER — Ambulatory Visit
Admission: RE | Admit: 2022-03-28 | Discharge: 2022-03-28 | Disposition: A | Discharge status: Home / Self Care | Payer: Medicaid Other | Source: Ambulatory Visit | Attending: Radiation Oncology | Admitting: Radiation Oncology

## 2022-03-28 DIAGNOSIS — Z510 Encounter for antineoplastic radiation therapy: Secondary | ICD-10-CM | POA: Diagnosis not present

## 2022-03-28 LAB — RAD ONC ARIA SESSION SUMMARY
Course Elapsed Days: 17
Plan Fractions Treated to Date: 12
Plan Prescribed Dose Per Fraction: 1.8 Gy
Plan Total Fractions Prescribed: 25
Plan Total Prescribed Dose: 45 Gy
Reference Point Dosage Given to Date: 21.6 Gy
Reference Point Session Dosage Given: 1.8 Gy
Session Number: 12

## 2022-03-29 ENCOUNTER — Ambulatory Visit: Payer: Medicaid Other

## 2022-04-01 ENCOUNTER — Ambulatory Visit: Payer: Medicaid Other

## 2022-04-01 ENCOUNTER — Telehealth: Payer: Self-pay | Admitting: Radiation Oncology

## 2022-04-01 NOTE — Telephone Encounter (Signed)
Antonio Navarro called stating the patient is experiencing severe diarrhea and will not be in for [facility] 8/14. L4 notified.

## 2022-04-02 ENCOUNTER — Ambulatory Visit
Admission: RE | Admit: 2022-04-02 | Discharge: 2022-04-02 | Disposition: A | Discharge status: Home / Self Care | Payer: Medicaid Other | Source: Ambulatory Visit | Attending: Radiation Oncology | Admitting: Radiation Oncology

## 2022-04-02 ENCOUNTER — Other Ambulatory Visit: Payer: Self-pay

## 2022-04-02 DIAGNOSIS — Z510 Encounter for antineoplastic radiation therapy: Secondary | ICD-10-CM | POA: Diagnosis not present

## 2022-04-02 LAB — RAD ONC ARIA SESSION SUMMARY
Course Elapsed Days: 22
Plan Fractions Treated to Date: 13
Plan Prescribed Dose Per Fraction: 1.8 Gy
Plan Total Fractions Prescribed: 25
Plan Total Prescribed Dose: 45 Gy
Reference Point Dosage Given to Date: 23.4 Gy
Reference Point Session Dosage Given: 1.8 Gy
Session Number: 13

## 2022-04-03 ENCOUNTER — Ambulatory Visit: Payer: Medicaid Other

## 2022-04-03 ENCOUNTER — Ambulatory Visit: Payer: Self-pay | Admitting: Cardiology

## 2022-04-04 ENCOUNTER — Ambulatory Visit
Admission: RE | Admit: 2022-04-04 | Discharge: 2022-04-04 | Disposition: A | Discharge status: Home / Self Care | Payer: Medicaid Other | Source: Ambulatory Visit | Attending: Radiation Oncology | Admitting: Radiation Oncology

## 2022-04-04 ENCOUNTER — Other Ambulatory Visit: Payer: Self-pay

## 2022-04-04 DIAGNOSIS — Z510 Encounter for antineoplastic radiation therapy: Secondary | ICD-10-CM | POA: Diagnosis not present

## 2022-04-04 LAB — RAD ONC ARIA SESSION SUMMARY
Course Elapsed Days: 24
Plan Fractions Treated to Date: 14
Plan Prescribed Dose Per Fraction: 1.8 Gy
Plan Total Fractions Prescribed: 25
Plan Total Prescribed Dose: 45 Gy
Reference Point Dosage Given to Date: 25.2 Gy
Reference Point Session Dosage Given: 1.8 Gy
Session Number: 14

## 2022-04-05 ENCOUNTER — Other Ambulatory Visit: Payer: Self-pay

## 2022-04-05 ENCOUNTER — Ambulatory Visit
Admission: RE | Admit: 2022-04-05 | Discharge: 2022-04-05 | Disposition: A | Discharge status: Home / Self Care | Payer: Medicaid Other | Source: Ambulatory Visit | Attending: Radiation Oncology | Admitting: Radiation Oncology

## 2022-04-05 DIAGNOSIS — Z510 Encounter for antineoplastic radiation therapy: Secondary | ICD-10-CM | POA: Diagnosis not present

## 2022-04-05 LAB — RAD ONC ARIA SESSION SUMMARY
Course Elapsed Days: 25
Plan Fractions Treated to Date: 15
Plan Prescribed Dose Per Fraction: 1.8 Gy
Plan Total Fractions Prescribed: 25
Plan Total Prescribed Dose: 45 Gy
Reference Point Dosage Given to Date: 27 Gy
Reference Point Session Dosage Given: 1.8 Gy
Session Number: 15

## 2022-04-08 ENCOUNTER — Other Ambulatory Visit: Payer: Self-pay

## 2022-04-08 ENCOUNTER — Ambulatory Visit
Admission: RE | Admit: 2022-04-08 | Discharge: 2022-04-08 | Disposition: A | Discharge status: Home / Self Care | Payer: Medicaid Other | Source: Ambulatory Visit | Attending: Radiation Oncology | Admitting: Radiation Oncology

## 2022-04-08 DIAGNOSIS — Z510 Encounter for antineoplastic radiation therapy: Secondary | ICD-10-CM | POA: Diagnosis not present

## 2022-04-08 LAB — RAD ONC ARIA SESSION SUMMARY
Course Elapsed Days: 28
Plan Fractions Treated to Date: 16
Plan Prescribed Dose Per Fraction: 1.8 Gy
Plan Total Fractions Prescribed: 25
Plan Total Prescribed Dose: 45 Gy
Reference Point Dosage Given to Date: 28.8 Gy
Reference Point Session Dosage Given: 1.8 Gy
Session Number: 16

## 2022-04-09 ENCOUNTER — Ambulatory Visit: Payer: Medicaid Other

## 2022-04-10 ENCOUNTER — Encounter: Payer: Self-pay | Admitting: Urology

## 2022-04-10 ENCOUNTER — Ambulatory Visit
Admission: RE | Admit: 2022-04-10 | Discharge: 2022-04-10 | Disposition: A | Discharge status: Home / Self Care | Payer: Medicaid Other | Source: Ambulatory Visit | Attending: Radiation Oncology | Admitting: Radiation Oncology

## 2022-04-10 ENCOUNTER — Other Ambulatory Visit: Payer: Self-pay

## 2022-04-10 DIAGNOSIS — Z510 Encounter for antineoplastic radiation therapy: Secondary | ICD-10-CM | POA: Diagnosis not present

## 2022-04-10 LAB — RAD ONC ARIA SESSION SUMMARY
Course Elapsed Days: 30
Plan Fractions Treated to Date: 17
Plan Prescribed Dose Per Fraction: 1.8 Gy
Plan Total Fractions Prescribed: 25
Plan Total Prescribed Dose: 45 Gy
Reference Point Dosage Given to Date: 30.6 Gy
Reference Point Session Dosage Given: 1.8 Gy
Session Number: 17

## 2022-04-11 ENCOUNTER — Ambulatory Visit: Payer: Medicaid Other

## 2022-04-11 ENCOUNTER — Telehealth: Payer: Self-pay

## 2022-04-11 NOTE — Telephone Encounter (Signed)
Called sister to respond to question regarding if pt should restart oral chemo. Dr. Alen Blew said no since pt has stopped his radiation.

## 2022-04-12 ENCOUNTER — Ambulatory Visit: Payer: Medicaid Other

## 2022-04-15 ENCOUNTER — Ambulatory Visit: Payer: Medicaid Other

## 2022-04-16 ENCOUNTER — Ambulatory Visit: Payer: Medicaid Other

## 2022-04-17 ENCOUNTER — Ambulatory Visit: Payer: Medicaid Other

## 2022-04-18 ENCOUNTER — Ambulatory Visit: Payer: Medicaid Other

## 2022-04-19 ENCOUNTER — Ambulatory Visit: Payer: Medicaid Other

## 2022-04-21 ENCOUNTER — Ambulatory Visit: Payer: Medicaid Other

## 2022-04-23 ENCOUNTER — Ambulatory Visit: Payer: Medicaid Other

## 2022-04-24 ENCOUNTER — Ambulatory Visit: Payer: Medicaid Other

## 2022-04-25 ENCOUNTER — Ambulatory Visit: Payer: Medicaid Other

## 2022-04-26 ENCOUNTER — Ambulatory Visit: Payer: Medicaid Other

## 2022-04-29 ENCOUNTER — Ambulatory Visit: Payer: Medicaid Other

## 2022-04-30 ENCOUNTER — Ambulatory Visit: Payer: Medicaid Other

## 2022-05-01 ENCOUNTER — Ambulatory Visit: Payer: Medicaid Other

## 2022-05-02 ENCOUNTER — Ambulatory Visit: Payer: Medicaid Other

## 2022-05-03 ENCOUNTER — Ambulatory Visit: Payer: Medicaid Other

## 2022-05-06 ENCOUNTER — Ambulatory Visit: Payer: Medicaid Other

## 2022-05-06 NOTE — Progress Notes (Signed)
Patient decided to stop treatment on 8/23 due to feelings of "taking a toll on him."    Dr. Tammi Klippel spoke with patient's wife, Antonio Navarro,  and provided education regarding his young age and advanced disease, as 61 y.o. gentleman with Stage T3bN1 adenocarcinoma of the prostate with Gleason score of 5+4, and PSA of 10.81, this decision is likely to lead to a shorter survival.  They do understand that and are focusing more on quality of life now rather than longevity or quality of life in the future.  I encouraged him to keep follow-up with Dr. Theone Murdoch and Dr. Alen Blew to continue ADT and abiraterone and Antonio Navarro agreed with that.  Patient has appointment with Dr. Cain Sieve in November to continue his ADT.

## 2022-05-07 ENCOUNTER — Ambulatory Visit: Payer: Medicaid Other

## 2022-05-08 ENCOUNTER — Ambulatory Visit: Payer: Medicaid Other

## 2022-05-09 ENCOUNTER — Ambulatory Visit: Payer: Medicaid Other

## 2022-05-10 ENCOUNTER — Ambulatory Visit: Payer: Medicaid Other

## 2022-05-13 ENCOUNTER — Ambulatory Visit: Payer: Medicaid Other

## 2022-05-14 ENCOUNTER — Ambulatory Visit: Payer: Medicaid Other

## 2022-05-17 ENCOUNTER — Ambulatory Visit: Payer: Medicaid Other | Admitting: Physician Assistant

## 2022-05-19 NOTE — Progress Notes (Unsigned)
REFERRING PROVIDER: Tyler Pita, MD [place],  [place] [id number]   PRIMARY PROVIDER:  Johny Blamer, DO  PRIMARY REASON FOR VISIT:  Encounter Diagnoses  Name Primary?   Malignant neoplasm of prostate ([place]) Yes   Family history of ovarian cancer    Family history of breast cancer    Family history of prostate cancer      HISTORY OF PRESENT ILLNESS:   Antonio Navarro, a 61 y.o. male, was seen for a [place] cancer genetics consultation at the request of Dr. Tammi Klippel due to a personal history of prostate cancer and family history of breast, ovarian, and prostate cancers.  Antonio Navarro was accompanied by his sister, Antonio Navarro. Antonio Navarro presents to clinic today to discuss the possibility of a hereditary predisposition to cancer, to discuss genetic testing, and to further clarify his future cancer risks, as well as potential cancer risks for family members.   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    Past Medical History:  Diagnosis Date   Anoxic brain injury ([place]) 07/22/2020   cardiac arrest for 10 minutes prior to resuscitationed by EMS , residual unable to write, gait instability (uses wheelchair) has speech impediment but can communicate   CAD (coronary artery disease), native coronary artery 07/2020   cardiologist--- dr Gasper Sells;    07-22-2020 s/p cardiac arrest in setting of STEMI with cath showing 40%pRCA and 95% mLCx ,  DES x1 to mLCx   Dysphagia    intermittant   Family history of adverse reaction to anesthesia    sister--  hard to wake   Gait instability    residual from brain injury ,  uses wheelchair   GERD (gastroesophageal reflux disease)    History of kidney stones    History of ST elevation myocardial infarction (STEMI) 07/22/2020   inferolateral ---  VDRF  shocked 6 times by EMS, intubated,  ef 40%,  post PCI Stent to CFx, residual anoxic brain injury due to  hypoxic encepholpathy;   pt had prolonged hospitalization   HLD (hyperlipidemia)    Hypertension    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   Ischemic cardiomyopathy 07/2020   07-22-2020  EF 40% at time of MI;   08-06-2021  ef 55-60% per echo   Malignant neoplasm prostate [facility]) 10/2021   primary urologist-- dr borden/  oncologist--- dr shadad/  radiation oncology-- dr Tammi Klippel;  dx 03/ 2023,  Gleason 5+4, PSA 10-81   Nocturia    S/P drug eluting coronary stent placement 07/22/2021   DES x 1 to midCFx   Speech problem    pt is able to communicate needs but has a little impediment   Urinary incontinence    uses depends   Uses wheelchair    uses wheelchair due to gait instability,  can stand and pivot with minminal assist    Past Surgical History:  Procedure Laterality Date   CORONARY/GRAFT ACUTE MI REVASCULARIZATION N/A 07/22/2020   Procedure: Coronary/Graft Acute MI Revascularization;  Surgeon: Lorretta Harp, MD;  Location: [place] [facility];  Service: Cardiovascular;  Laterality: N/A;   GOLD SEED IMPLANT N/A 02/26/2022   Procedure: GOLD SEED IMPLANT;  Surgeon: Ceasar Mons, MD;  Location: [facility];  Service: Urology;  Laterality: N/A;  30 MINS FOR THIS CASE   IR GASTROSTOMY TUBE MOD SED  08/17/2020   IR GASTROSTOMY TUBE  REMOVAL  11/08/2020   LEFT HEART CATH AND CORONARY ANGIOGRAPHY N/A 07/22/2020   Procedure: LEFT HEART CATH AND CORONARY ANGIOGRAPHY;  Surgeon: Lorretta Harp, MD;  Location: [place] [facility];  Service: Cardiovascular;  Laterality: N/A;   PROSTATE BIOPSY N/A 11/13/2021   Procedure: BIOPSY TRANSRECTAL ULTRASONIC PROSTATE (TUBP);  Surgeon: Vira Agar, MD;  Location: [facility];  Service: Urology;  Laterality: N/A;  58 MINS FOR THIS CASE   RADIOLOGY WITH ANESTHESIA N/A 10/16/2021   Procedure: MRI WITH ANESTHESIA OF PROSTATE WITH AND WITHOUT CONTRAST;  Surgeon: Radiologist, Medication, MD;  Location: [place];  Service: Radiology;   Laterality: N/A;   SPACE OAR INSTILLATION N/A 02/26/2022   Procedure: SPACE OAR INSTILLATION;  Surgeon: Ceasar Mons, MD;  Location: [facility];  Service: Urology;  Laterality: N/A;    FAMILY HISTORY:  We obtained a detailed, 4-generation family history.  Significant diagnoses are listed below: Family History  Problem Relation Age of Onset   Cancer Mother        d. age 55; unknown type   Breast cancer Sister 15   Ovarian cancer Maternal Aunt        dx before age 40   Prostate cancer Maternal Uncle        dx after 46   Breast cancer Paternal Aunt        dx 53s   Cancer Paternal Aunt        dx after 12; unknown type   Ovarian cancer Paternal Grandmother        dx and d. mid 37s     Antonio Navarro maternal half sister, Antonio Navarro, had negative genetic testing through [facility] in 2017 for ~20 breast/ovarian cancer genes.  She had an uncertain result in GTX64W called c.493C>T (p.Arg165Trp).  No other family history of genetic testing was reported.   There is no reported Ashkenazi Jewish ancestry.   GENETIC COUNSELING ASSESSMENT: Antonio Navarro is a 61 y.o. male with a personal and family which is somewhat suggestive of a hereditary cancer syndrome and predisposition to cancer given the risk status of his prostate cancer and the presence of related cancers in the family (breast, ovarian, prostate, etc). We, therefore, discussed and recommended the following at today's visit.   DISCUSSION: We discussed that 5 - 10% of cancer is hereditary.  Most cases of heredtiary prostate cancer are associated with mutations in BRCA1/2.  There are other genes that can be associated with hereditary prostate, breast, or ovarian cancer syndromes.  We discussed that testing is beneficial for several reasons including knowing how to follow individuals for their cancer risks, identifying whether potential treatment options, such as PARP inhibitors, would be beneficial, and understanding if other  family members could be at risk for cancer and allowing them to undergo genetic testing.   We reviewed the characteristics, features and inheritance patterns of hereditary cancer syndromes. We also discussed genetic testing, including the appropriate family members to test, the process of testing, insurance coverage and turn-around-time for results. We discussed the implications of a negative, positive, and/or variant of uncertain significant result. We recommended Antonio Navarro pursue genetic testing for a panel that includes genes associated with prostate, breast, ovarian, and other cancers.   The CancerNext-Expanded gene panel offered by [facility] and includes sequencing, rearrangement, and RNA analysis for the following 77 genes: AIP, ALK, APC, ATM, AXIN2, BAP1, BARD1, BLM, BMPR1A, BRCA1, BRCA2, BRIP1, CDC73, CDH1, CDK4, CDKN1B, CDKN2A, CHEK2, CTNNA1, DICER1, FANCC, FH, FLCN, GALNT12,  KIF1B, LZTR1, MAX, MEN1, MET, MLH1, MSH2, MSH3, MSH6, MUTYH, NBN, NF1, NF2, NTHL1, PALB2, PHOX2B, PMS2, POT1, PRKAR1A, PTCH1, PTEN, RAD51C, RAD51D, RB1, RECQL, RET, SDHA, SDHAF2, SDHB, SDHC, SDHD, SMAD4, SMARCA4, SMARCB1, SMARCE1, STK11, SUFU, TMEM127, TP53, TSC1, TSC2, VHL and XRCC2 (sequencing and deletion/duplication); EGFR, EGLN1, HOXB13, KIT, MITF, PDGFRA, POLD1, and POLE (sequencing only); EPCAM and GREM1 (deletion/duplication only).   Based on Antonio Navarro personal history of prostate cancer, he meets medical criteria for genetic testing. Despite that he meets criteria, he may still have an out of pocket cost. We discussed that if his out of pocket cost for testing is over $100, the laboratory should contact him and discuss the self-pay prices and/or patient pay assistance programs.     PLAN: After considering the risks, benefits, and limitations, consent was provided to pursue genetic testing and the blood sample was sent to [facility] for analysis of the CancerNext-Expanded +RNAinsight. Results should be  available within approximately 3 weeks' time, at which point they will be disclosed by telephone to Antonio Navarro sister, Antonio Navarro.   Lastly, we encouraged Antonio Navarro to remain in contact with cancer genetics annually so that we can continuously update the family history and inform him of any changes in cancer genetics and testing that may be of benefit for this family.   Antonio Navarro questions were answered to his satisfaction today. Our contact information was provided should additional questions or concerns arise. Thank you for the referral and allowing [place] to share in the care of your patient.   Antonio Navarro M. Antonio Navarro, [place], [facility] Genetic Counselor [email or web address] (P) [phone]  The patient was seen for a total of 20 minutes in face-to-face genetic counseling.  Patient was accompanied by his sister, Antonio Navarro. Drs. Lindi Adie and/or Burr Medico were available to discuss this case as needed.   _______________________________________________________________________ For Office Staff:  Number of people involved in session: 2 Was an Intern/ student involved with case: no

## 2022-05-20 ENCOUNTER — Other Ambulatory Visit: Payer: Self-pay | Admitting: Genetic Counselor

## 2022-05-20 ENCOUNTER — Inpatient Hospital Stay: Payer: Medicaid Other

## 2022-05-20 ENCOUNTER — Ambulatory Visit: Payer: Medicaid Other | Admitting: Physician Assistant

## 2022-05-20 ENCOUNTER — Encounter: Payer: Self-pay | Admitting: Genetic Counselor

## 2022-05-20 ENCOUNTER — Inpatient Hospital Stay: Payer: Medicaid Other | Attending: Hematology | Admitting: Genetic Counselor

## 2022-05-20 DIAGNOSIS — Z8041 Family history of malignant neoplasm of ovary: Secondary | ICD-10-CM

## 2022-05-20 DIAGNOSIS — Z8042 Family history of malignant neoplasm of prostate: Secondary | ICD-10-CM

## 2022-05-20 DIAGNOSIS — C61 Malignant neoplasm of prostate: Secondary | ICD-10-CM

## 2022-05-20 DIAGNOSIS — Z803 Family history of malignant neoplasm of breast: Secondary | ICD-10-CM | POA: Diagnosis not present

## 2022-05-20 LAB — GENETIC SCREENING ORDER

## 2022-05-23 ENCOUNTER — Other Ambulatory Visit: Payer: Self-pay

## 2022-05-23 NOTE — Telephone Encounter (Signed)
metoprolol succinate (TOPROL-XL) 25 MG 24 hr tablet , REFILL REQUEST @  [place] ([place]), [place]

## 2022-05-24 MED ORDER — METOPROLOL SUCCINATE ER 25 MG PO TB24: 12.5000 mg | ORAL_TABLET | Freq: Two times a day (BID) | ORAL | 3 refills | Status: DC

## 2022-06-13 ENCOUNTER — Encounter: Payer: Self-pay | Admitting: Genetic Counselor

## 2022-06-13 ENCOUNTER — Telehealth: Payer: Self-pay | Admitting: Genetic Counselor

## 2022-06-13 DIAGNOSIS — Z1379 Encounter for other screening for genetic and chromosomal anomalies: Secondary | ICD-10-CM | POA: Insufficient documentation

## 2022-06-13 NOTE — Telephone Encounter (Signed)
Contacted patient's sister, [name], in attempt to disclose results of genetic testing.  LVM with contact information requesting a call back.

## 2022-06-14 NOTE — Telephone Encounter (Signed)
Revealed negative genetics to sister [name].

## 2022-07-02 ENCOUNTER — Emergency Department (HOSPITAL_COMMUNITY)
Admission: EM | Admit: 2022-07-02 | Discharge: 2022-07-02 | Disposition: A | Discharge status: Home / Self Care | Payer: Medicaid Other | Attending: Emergency Medicine | Admitting: Emergency Medicine

## 2022-07-02 ENCOUNTER — Other Ambulatory Visit: Payer: Self-pay

## 2022-07-02 ENCOUNTER — Encounter (HOSPITAL_COMMUNITY): Payer: Self-pay | Admitting: Emergency Medicine

## 2022-07-02 DIAGNOSIS — I2510 Atherosclerotic heart disease of native coronary artery without angina pectoris: Secondary | ICD-10-CM | POA: Diagnosis not present

## 2022-07-02 DIAGNOSIS — X58XXXA Exposure to other specified factors, initial encounter: Secondary | ICD-10-CM | POA: Diagnosis not present

## 2022-07-02 DIAGNOSIS — Z7982 Long term (current) use of aspirin: Secondary | ICD-10-CM | POA: Diagnosis not present

## 2022-07-02 DIAGNOSIS — S01512A Laceration without foreign body of oral cavity, initial encounter: Secondary | ICD-10-CM | POA: Diagnosis not present

## 2022-07-02 DIAGNOSIS — Z8546 Personal history of malignant neoplasm of prostate: Secondary | ICD-10-CM | POA: Insufficient documentation

## 2022-07-02 DIAGNOSIS — Y9384 Activity, sleeping: Secondary | ICD-10-CM | POA: Diagnosis not present

## 2022-07-02 DIAGNOSIS — S0993XA Unspecified injury of face, initial encounter: Secondary | ICD-10-CM | POA: Diagnosis present

## 2022-07-02 NOTE — ED Triage Notes (Signed)
Pt's wife states that the pt bit his tongue while sleeping.

## 2022-07-02 NOTE — ED Provider Notes (Signed)
[facility] DEPT Provider Note   CSN: [id number] Arrival date & time: 07/02/22  2208     History  Chief Complaint  Patient presents with   Laceration    RAS KOLLMAN is a 61 y.o. male with a past medical history significant for history of STEMI, ischemic cardiomyopathy, anoxic brain damage, CAD, hyperlipidemia, and prostate cancer who presents to the ED due to tongue laceration.  Fianc at bedside states that patient was asleep and bit his tongue.  She is currently on ASA 81 mg.  No other injuries.  Ellene Route states that patient has been spitting up small amounts of blood since the injury.  History obtained from patient, fiance, and past medical records. No interpreter used during encounter.       Home Medications Prior to Admission medications   Medication Sig Start Date End Date Taking? Authorizing Provider  abiraterone acetate (ZYTIGA) 250 MG tablet Take 4 tablets (1,000 mg total) by mouth daily. Take on an empty stomach 1 hour before or 2 hours after a meal Patient not taking: Reported on 02/22/2022 01/01/22   Wyatt Portela, MD  acetaminophen (TYLENOL) 500 MG tablet Take 500 mg by mouth every 6 (six) hours as needed.    [provider]  aspirin 81 MG chewable tablet Chew 1 tablet (81 mg total) by mouth daily. 11/24/20   Samella Parr, NP  atorvastatin (LIPITOR) 80 MG tablet Take 1 tablet (80 mg total) by mouth daily. Patient taking differently: Take 80 mg by mouth at bedtime. 02/05/22   Wayland Denis, MD  clopidogrel (PLAVIX) 75 MG tablet Take 1 tablet (75 mg total) by mouth daily. Patient taking differently: Take 75 mg by mouth daily. Pt stopped plavix 5 days prior to surgery on 02-26-2022 per cardiology instructions 06/04/21   Sueanne Margarita, MD  losartan (COZAAR) 25 MG tablet Take 1 tablet (25 mg total) by mouth daily. Patient taking differently: Take 25 mg by mouth daily. 02/05/22   Wayland Denis, MD  metoprolol succinate (TOPROL-XL) 25  MG 24 hr tablet Take 0.5 tablets (12.5 mg total) by mouth 2 (two) times daily. 05/24/22   Lacinda Axon, MD  polyethylene glycol (MIRALAX / GLYCOLAX) 17 g packet Take 17 g by mouth daily as needed for moderate constipation. 11/24/20   Samella Parr, NP  predniSONE (DELTASONE) 5 MG tablet Take 1 tablet (5 mg total) by mouth daily with breakfast. 01/01/22   Wyatt Portela, MD      Allergies    Patient has no known allergies.    Review of Systems   Review of Systems  Unable to perform ROS: Patient nonverbal    Physical Exam Updated Vital Signs BP (!) 130/100 (BP Location: Right Arm)   Pulse 100   Temp 98.6 F (37 C) (Oral)   Resp 16   SpO2 99%  Physical Exam Vitals and nursing note reviewed.  Constitutional:      General: He is not in acute distress.    Appearance: He is not ill-appearing.  HENT:     Head: Normocephalic.     Mouth/Throat:     Comments: Small 1cm laceration to left border of tongue. Laceration does not go through and through. Eyes:     Pupils: Pupils are equal, round, and reactive to light.  Cardiovascular:     Rate and Rhythm: Normal rate and regular rhythm.     Pulses: Normal pulses.     Heart sounds: Normal heart sounds. No  murmur heard.    No friction rub. No gallop.  Pulmonary:     Effort: Pulmonary effort is normal.     Breath sounds: Normal breath sounds.  Abdominal:     General: Abdomen is flat. There is no distension.     Palpations: Abdomen is soft.     Tenderness: There is no abdominal tenderness. There is no guarding or rebound.  Musculoskeletal:        General: Normal range of motion.     Cervical back: Neck supple.  Skin:    General: Skin is warm and dry.  Neurological:     General: No focal deficit present.     Mental Status: He is alert.  Psychiatric:        Mood and Affect: Mood normal.        Behavior: Behavior normal.     ED Results / Procedures / Treatments   Labs (all labs ordered are listed, but only abnormal results  are displayed) Labs Reviewed - No data to display  EKG None  Radiology No results found.  Procedures Procedures    Medications Ordered in ED Medications - No data to display  ED Course/ Medical Decision Making/ A&P                           Medical Decision Making Amount and/or Complexity of Data Reviewed Independent Historian: spouse   61 year old male presents to the ED due to tongue laceration.  History of anoxic brain injury.  Fianc at bedside states that patient bit his tongue while he was sleeping.  Patient is on ASA 81 mg.  Upon arrival, stable vitals.  Patient in no acute distress.  Physical exam significant for 1cm laceration to right border of tongue.  Laceration does not go through and through. Suture repair not warranted at this time. Discussed with fianc about having patient wash out mouth after eating. Strict ED precautions discussed with patient. Patient states understanding and agrees to plan. Patient discharged home in no acute distress and stable vitals  Discussed with Dr. Laverta Baltimore who evaluated patient at bedside and agrees with assessment and plan.        Final Clinical Impression(s) / ED Diagnoses Final diagnoses:  Laceration of tongue, initial encounter    Rx / DC Orders ED Discharge Orders     None         Karie Kirks 07/02/22 2302    Margette Fast, MD 07/05/22 [phone]

## 2022-07-02 NOTE — Discharge Instructions (Addendum)
It was a pleasure taking care of you today.  As discussed, the laceration will heal on its own.  Please wash out mouth after eating.  Follow-up with PCP if symptoms do not improve over the next few days.  Return to the ER for new or worsening symptoms.

## 2022-07-08 NOTE — Progress Notes (Signed)
HPI:   Mr. Hagemeister was previously seen in the [place] clinic due to a personal and family history of cancer and concerns regarding a hereditary predisposition to cancer. Please refer to our prior cancer genetics clinic note for more information regarding our discussion, assessment and recommendations, at the time. Mr. Pomplun recent genetic test results were disclosed to his sister, as were recommendations warranted by these results. These results and recommendations are discussed in more detail below.  CANCER HISTORY:  In March 2023, at the age of 61, Mr. 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    Oncology History  Malignant neoplasm of prostate ([place])  12/16/2021 Initial Diagnosis   Malignant neoplasm of prostate ([place])   06/11/2022 Genetic Testing   Negative genetics.  No pathogenic variants detected in [facility] CancerNext-Expanded +RNAinsight Panel.  Report date is 06/11/2022.   The CancerNext-Expanded gene panel offered by [facility] and includes sequencing, rearrangement, and RNA analysis for the following 77 genes: AIP, ALK, APC, ATM, AXIN2, BAP1, BARD1, BLM, BMPR1A, BRCA1, BRCA2, BRIP1, CDC73, CDH1, CDK4, CDKN1B, CDKN2A, CHEK2, CTNNA1, DICER1, FANCC, FH, FLCN, GALNT12, KIF1B, LZTR1, MAX, MEN1, MET, MLH1, MSH2, MSH3, MSH6, MUTYH, NBN, NF1, NF2, NTHL1, PALB2, PHOX2B, PMS2, POT1, PRKAR1A, PTCH1, PTEN, RAD51C, RAD51D, RB1, RECQL, RET, SDHA, SDHAF2, SDHB, SDHC, SDHD, SMAD4, SMARCA4, SMARCB1, SMARCE1, STK11, SUFU, TMEM127, TP53, TSC1, TSC2, VHL and XRCC2 (sequencing and deletion/duplication); EGFR, EGLN1, HOXB13, KIT, MITF, PDGFRA, POLD1, and POLE (sequencing only); EPCAM and GREM1 (deletion/duplication only).      FAMILY HISTORY:  We obtained a detailed, 4-generation family history.  Significant diagnoses are listed below:      Family History  Problem Relation Age of Onset   Cancer Mother          d. age 47; unknown type    Breast cancer Sister 35   Ovarian cancer Maternal Aunt          dx before age 66   Prostate cancer Maternal Uncle          dx after 45   Breast cancer Paternal Aunt          dx 16s   Cancer Paternal Aunt          dx after 61; unknown type   Ovarian cancer Paternal Grandmother          dx and d. mid 38s       Mr. Runk maternal half sister, [name], had negative genetic testing through [facility] in 2017 for ~20 breast/ovarian cancer genes.  She had an uncertain result in TDD22G called c.493C>T (p.Arg165Trp).  No other family history of genetic testing was reported.    There is no reported Ashkenazi Jewish ancestry.   GENETIC TEST RESULTS:  The [facility] CancerNext-Expanded +RNAinsight Panel found no pathogenic mutations.   The CancerNext-Expanded gene panel offered by [facility] and includes sequencing, rearrangement, and RNA analysis for the following 77 genes: AIP, ALK, APC, ATM, AXIN2, BAP1, BARD1, BLM, BMPR1A, BRCA1, BRCA2, BRIP1, CDC73, CDH1, CDK4, CDKN1B, CDKN2A, CHEK2, CTNNA1, DICER1, FANCC, FH, FLCN, GALNT12, KIF1B, LZTR1, MAX, MEN1, MET, MLH1, MSH2, MSH3, MSH6, MUTYH, NBN, NF1, NF2, NTHL1, PALB2, PHOX2B, PMS2, POT1, PRKAR1A, PTCH1, PTEN, RAD51C, RAD51D, RB1, RECQL, RET, SDHA, SDHAF2, SDHB, SDHC, SDHD, SMAD4, SMARCA4, SMARCB1, SMARCE1, STK11, SUFU, TMEM127, TP53, TSC1, TSC2, VHL and XRCC2 (sequencing and deletion/duplication); EGFR, EGLN1, HOXB13, KIT, MITF, PDGFRA, POLD1, and POLE (sequencing only); EPCAM and GREM1 (deletion/duplication only).  .   The test  report has been scanned into EPIC and is located under the Molecular Pathology section of the Results Review tab.  A portion of the result report is included below for reference. Genetic testing reported out on June 11, 2022.       Even though a pathogenic variant was not identified, possible explanations for the cancer in the family may include: There may be no hereditary risk for cancer in the family. The cancers in Mr.  Rosiak and/or his family may be sporadic/familial or due to other genetic and environmental factors. There may be a gene mutation in one of these genes that current testing methods cannot detect but that chance is small. There could be another gene that has not yet been discovered, or that we have not yet tested, that is responsible for the cancer diagnoses in the family.  It is also possible there is a hereditary cause for the cancer in the family that Mr. Kratochvil did not inherit.   Therefore, it is important to remain in touch with cancer genetics in the future so that we can continue to offer Mr. Genest the most up to date genetic testing.    ADDITIONAL GENETIC TESTING:  We discussed that Mr. Desire genetic testing was fairly extensive.  If there are additional relevant genes identified to increase cancer risk that can be analyzed in the future, we would be happy to discuss and coordinate this testing at that time.   CANCER SCREENING RECOMMENDATIONS:  Mr. Treat test result is considered negative (normal).  This means that we have not identified a hereditary cause for his personal history of prostate cancer at this time.   An individual's cancer risk and medical management are not determined by genetic test results alone. Overall cancer risk assessment incorporates additional factors, including personal medical history, family history, and any available genetic information that may result in a personalized plan for cancer prevention and surveillance. Therefore, it is recommended he continue to follow the cancer management and screening guidelines provided by his oncology and primary healthcare provider.  RECOMMENDATIONS FOR FAMILY MEMBERS:   Since he did not inherit a identifiable mutation in a cancer predisposition gene included on this panel, his son could not have inherited a known mutation from him in one of these genes. Individuals in this family might be at some increased risk of developing  cancer, over the general population risk, due to the family history of cancer.  Individuals in the family should notify their providers of the family history of cancer. We recommend women in this family have a yearly mammogram beginning at age 20, or 60 years younger than the earliest onset of cancer, an annual clinical breast exam, and perform monthly breast self-exams.  Male should speak with their providers about considering prostate cancer.  Other members of the family may still carry a pathogenic variant in one of these genes that Mr. Poynter did not inherit. Based on the family history, we recommend his maternal aunt, who was diagnosed with ovarian cancer, have genetic counseling and testing. Mr. Badolato can let us know if we can be of any assistance in coordinating genetic counseling and/or testing for this family member.     FOLLOW-UP:  Lastly, we discussed with Mr. Brittian sister that cancer genetics is a rapidly advancing field and it is possible that new genetic tests will be appropriate for him and/or his family members in the future. We encouraged the family to remain in contact with cancer genetics on an annual basis  so we can update the family histories and let them know of advances in cancer genetics that may benefit this family.   Our contact number was provided. Questions were answered, and the family knows they are welcome to contact us in the future with questions.   Jessaca Philippi M. Joette Catching, [place], [facility] Genetic Counselor [email or web address] (P) [phone]

## 2022-07-15 ENCOUNTER — Ambulatory Visit: Payer: Self-pay | Admitting: Genetic Counselor

## 2022-07-15 DIAGNOSIS — Z8042 Family history of malignant neoplasm of prostate: Secondary | ICD-10-CM

## 2022-07-15 DIAGNOSIS — Z1379 Encounter for other screening for genetic and chromosomal anomalies: Secondary | ICD-10-CM

## 2022-07-15 DIAGNOSIS — Z803 Family history of malignant neoplasm of breast: Secondary | ICD-10-CM

## 2022-07-15 DIAGNOSIS — Z8041 Family history of malignant neoplasm of ovary: Secondary | ICD-10-CM

## 2022-07-15 DIAGNOSIS — C61 Malignant neoplasm of prostate: Secondary | ICD-10-CM

## 2022-07-23 ENCOUNTER — Encounter: Payer: Medicaid Other | Admitting: Student

## 2022-08-02 NOTE — Progress Notes (Signed)
  Radiation Oncology         [phone] ________________________________  Name: Antonio Navarro MRN: [id number]  Date: 04/10/2022  DOB: 1961/07/14  End of Treatment Note  Diagnosis:   62 y.o. 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      Indication for treatment:  Curative, Definitive Radiotherapy       Radiation treatment dates:   03/11/22 - 04/10/22  Site/dose:  It was planned for the patient to receive 45 Gy in 25 fractions of 1.8 Gy to the prostate, seminal vesicles and pelvic nodes, followed by a boost to the prostate and PET-positive nodes to a total dose of 75 Gy with 15 additional fractions of 2 Gy. Unfortunately, the patient decided to discontinue treatment after only 17 of the initial 25 fractions achieving a total dose of 30.6 Gy only.   Beams/energy:  1. The prostate, seminal vesicles, and pelvic lymph nodes were initially treated using VMAT intensity modulated radiotherapy delivering 6 megavolt photons. Image guidance was performed with CB-CT studies prior to each fraction. He was immobilized with a body fix lower extremity mold.   Narrative: The patient tolerated radiation treatment relatively well but did experience significant diarrhea and fatigue which led to his decision to discontinue treatments prior to completion.   Plan: The patient elected to discontinue treatments prior to completion.  ________________________________  Sheral Apley Tammi Klippel, M.D.

## 2022-08-08 ENCOUNTER — Other Ambulatory Visit: Payer: Self-pay | Admitting: Oncology

## 2022-08-08 ENCOUNTER — Telehealth: Payer: Self-pay | Admitting: Oncology

## 2022-08-08 DIAGNOSIS — C61 Malignant neoplasm of prostate: Secondary | ICD-10-CM

## 2022-08-08 NOTE — Telephone Encounter (Signed)
Patient's sister called to attempt to r/s appointment. Confirmed this appointment is for a new referral and Dr. Alen Blew picked this time to see patient. Attempted to call back but left voicemail

## 2022-08-13 ENCOUNTER — Inpatient Hospital Stay: Payer: Medicaid Other

## 2022-08-13 ENCOUNTER — Inpatient Hospital Stay: Payer: Medicaid Other | Admitting: Oncology

## 2022-08-21 ENCOUNTER — Telehealth: Payer: Self-pay | Admitting: Oncology

## 2022-08-21 NOTE — Telephone Encounter (Signed)
Called patient to rescheduled missed appointments. Left msg for patient to call back to scheduled appointment.

## 2022-08-28 ENCOUNTER — Other Ambulatory Visit: Payer: Self-pay | Admitting: Cardiology

## 2022-08-30 ENCOUNTER — Ambulatory Visit: Payer: Medicaid Other | Admitting: Hematology

## 2022-09-11 ENCOUNTER — Ambulatory Visit (INDEPENDENT_AMBULATORY_CARE_PROVIDER_SITE_OTHER): Payer: Medicaid Other | Admitting: Student

## 2022-09-11 VITALS — BP 102/84 | HR 104 | Temp 98.4°F | Ht 72.0 in | Wt 164.0 lb

## 2022-09-11 DIAGNOSIS — R3981 Functional urinary incontinence: Secondary | ICD-10-CM | POA: Diagnosis not present

## 2022-09-11 DIAGNOSIS — G931 Anoxic brain damage, not elsewhere classified: Secondary | ICD-10-CM

## 2022-09-11 DIAGNOSIS — Z993 Dependence on wheelchair: Secondary | ICD-10-CM

## 2022-09-11 NOTE — Patient Instructions (Addendum)
Thank you, [name] for allowing [place] to provide your care today. Today we discussed your need for incontinence supplies.  I have sent a referral to the company to refill your incontinence supplies.   My Chart Access: [email or web address]  Please follow-up in 3 months  Please make sure to arrive 15 minutes prior to your next appointment. If you arrive late, you may be asked to reschedule.    We look forward to seeing you next time. Please call our clinic at [phone] if you have any questions or concerns. The best time to call is Monday-Friday from 9am-4pm, but there is someone available 24/7. If after hours or the weekend, call the main hospital number and ask for the Internal Medicine Resident On-Call. If you need medication refills, please notify your pharmacy one week in advance and they will send [place] a request.   Thank you for letting us take part in your care. Wishing you the best!  Lacinda Axon, MD 09/11/2022, 1:50 PM IM Resident, PGY-3 [name] 41:10

## 2022-09-11 NOTE — Progress Notes (Signed)
CC: Need for incontinence supplies  HPI:  [name] is a 62 y.o. male with PMH as below who presents to clinic accompanied by her sister for renewal of incontinence supplies orders. Please see problem based charting for evaluation, assessment and plan.  Past Medical History:  Diagnosis Date   Anoxic brain injury ([place]) 07/22/2020   cardiac arrest for 10 minutes prior to resuscitationed by EMS , residual unable to write, gait instability (uses wheelchair) has speech impediment but can communicate   CAD (coronary artery disease), native coronary artery 07/2020   cardiologist--- dr Gasper Sells;    07-22-2020 s/p cardiac arrest in setting of STEMI with cath showing 40%pRCA and 95% mLCx ,  DES x1 to mLCx   Dysphagia    intermittant   Family history of adverse reaction to anesthesia    sister--  hard to wake   Gait instability    residual from brain injury ,  uses wheelchair   GERD (gastroesophageal reflux disease)    History of kidney stones    History of ST elevation myocardial infarction (STEMI) 07/22/2020   inferolateral ---  VDRF  shocked 6 times by EMS, intubated,  ef 40%,  post PCI Stent to CFx, 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;   pt had prolonged hospitalization   HLD (hyperlipidemia)    Hypertension    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   Ischemic cardiomyopathy 07/2020   07-22-2020  EF 40% at time of MI;   08-06-2021  ef 55-60% per echo   Malignant neoplasm prostate [facility]) 10/2021   primary urologist-- dr borden/  oncologist--- dr shadad/  radiation oncology-- dr Tammi Klippel;  dx 03/ 2023,  Gleason 5+4, PSA 10-81   Nocturia    S/P drug eluting coronary stent placement 07/22/2021   DES x 1 to midCFx   Speech problem    pt is able to communicate needs but has a little impediment   Urinary incontinence    uses depends   Uses wheelchair    uses wheelchair due to gait instability,  can stand and pivot with minminal assist    Review  of Systems:  Constitutional: Negative for fever or fatigue GU: Positive for urinary incontinence Neuro: Positive for weakness and dysarthria  Physical Exam: General: Pleasant, well-appearing male in a wheelchair. No acute distress. Cardiac: RRR. No murmurs, rubs or gallops. No LE edema Respiratory: Lungs CTAB. No wheezing or crackles. Abdominal: Soft, symmetric and non tender. Normal BS. Skin: Warm, dry and intact without rashes or lesions Extremities: Atraumatic. Full ROM. Palpable radial and DP pulses. Neuro: A&O x 3. Unchanged dysarthric speech. Limited fund of knowledge.  Able to follow commands.  Decreased grip strength. Psych: Appropriate mood and affect.  Vitals:   09/11/22 1331 09/11/22 1359  BP: (!) 125/105 102/84  Pulse: (!) 50 (!) 104  Temp: 98.4 F (36.9 C)   TempSrc: Oral   SpO2: 98%   Weight: 164 lb (74.4 kg)   Height: 6' (1.829 m)     Assessment & Plan:   Anoxic brain damage [facility]) Patient here today accompanied by his sister to request order for incontinence supplies.  Patient with a history of anoxic brain injury following STEMI in 2021 and has since become wheelchair-bound and requiring full assistance with all ADLs and IADLs, including transfers.  Patient's sister and fianc are his caretakers.  At baseline he has dysarthria, unsteady gait and difficulty with coordination.  Patient also have urinary incontinence due  to his brain injury requiring incontinence supply to improve his quality of life.  Plan: -Ambulatory referral for DME, incontinence supplies.  Patient uses [place] in 3 months for lab work and discussion about screenings.   See Encounters Tab for problem based charting.  Patient discussed with Dr.  Thomes Cake, MD, MPH

## 2022-09-13 ENCOUNTER — Encounter: Payer: Self-pay | Admitting: Student

## 2022-09-13 ENCOUNTER — Ambulatory Visit: Payer: Medicaid Other | Admitting: Hematology

## 2022-09-13 NOTE — Assessment & Plan Note (Signed)
Patient here today accompanied by his sister to request order for incontinence supplies.  Patient with a history of anoxic brain injury following STEMI in 2021 and has since become wheelchair-bound and requiring full assistance with all ADLs and IADLs, including transfers.  Patient's sister and fianc are his caretakers.  At baseline he has dysarthria, unsteady gait and difficulty with coordination.  Patient also have urinary incontinence due to his brain injury requiring incontinence supply to improve his quality of life.  Plan: -Ambulatory referral for DME, incontinence supplies.  Patient uses [place] in 3 months for lab work and discussion about screenings.

## 2022-09-16 NOTE — Progress Notes (Signed)
Internal Medicine Clinic Attending  Case discussed with Dr. Amponsah  At the time of the visit.  We reviewed the resident's history and exam and pertinent patient test results.  I agree with the assessment, diagnosis, and plan of care documented in the resident's note.  

## 2022-09-17 NOTE — Progress Notes (Deleted)
Antonio Navarro   Telephone:(336) [phone] Fax:([phone]) [place] Note   Patient Care Team: Johny Blamer, DO as PCP - General Sueanne Margarita, MD as PCP - Cardiology (Cardiology) Pcp, No  Date of Service:  09/17/2022   CHIEF COMPLAINTS/PURPOSE OF CONSULTATION:  Malignant neoplasm of Prostate  REFERRING PHYSICIAN:  ***  ASSESSMENT & PLAN:  Antonio Navarro is a 62 y.o. *** male with a history of ***  1. {Diagnosis from problem list}       PLAN:  ***  Oncology History Overview Note   Cancer Staging  Malignant neoplasm of prostate [facility]) Staging form: Prostate, AJCC 8th Edition - Clinical stage from 12/17/2021: Stage IVA (cT1c, cN1, cM0, Grade Group: 5) - Unsigned Histopathologic type: Adenocarcinoma, NOS Histologic grading system: 5 grade system     Malignant neoplasm of prostate ([place])  11/13/2021 Pathology Results    FINAL MICROSCOPIC DIAGNOSIS:  A. PROSTATE, RIGHT BASE LATERAL, BIOPSY: Small focus of prostatic adenocarcinoma, Gleason score 4+3=7 involves 5% of one core.  B. PROSTATE, RIGHT BASE MEDIAL, BIOPSY: Prostatic adenocarcinoma, Gleason score 5+4=9 involves 50% of one core. Perineural invasion identified.  C. PROSTATE, RIGHT MID LATERAL, BIOPSY: Prostatic adenocarcinoma, Gleason score 5+4=9 involves 50% of one core.  D. PROSTATE, RIGHT MID MEDIAL, BIOPSY: Prostatic adenocarcinoma, Gleason score 4+5=9 involves 70% of one core. Perineural invasion identified.  E. PROSTATE, RIGHT APEX LATERAL, BIOPSY: Prostatic adenocarcinoma, Gleason score 4+5=9 involves 70% of one core. Perineural invasion identified.  F. PROSTATE, RIGHT APEX MEDIAL, BIOPSY: Prostatic adenocarcinoma, Gleason score 4+5=9 involves 70% of one core.  G. PROSTATE, LEFT APEX LATERAL, BIOPSY: Prostatic adenocarcinoma, Gleason score 4+5=9 involves 80% of one core.  H. PROSTATE, LEFT BASE MEDIAL, BIOPSY: Prostatic adenocarcinoma, Gleason score 4+5=9 involves  90% of one core. Perineural invasion identified.  I. PROSTATE, LEFT MID LATERAL, BIOPSY: Prostatic adenocarcinoma, Gleason score 4+5=9 involves 90% of one core. Perineural invasion identified.  J. PROSTATE, LEFT MID MEDIAL, BIOPSY: Prostatic adenocarcinoma, Gleason score 5+4=9 (grade group 5) involves 80% of one core. Perineural invasion identified.  K. PROSTATE, LEFT APEX LATERAL, BIOPSY: Prostatic adenocarcinoma, Gleason score 5+4=9 involves 60% of one core. Perineural invasion identified.  L. PROSTATE, LEFT APEX MEDIAL, BIOPSY: Prostatic adenocarcinoma, Gleason score 5+4=9 involves 60% of one core. Perineural invasion identified.  M. PROSTATE, LEFT SV, BIOPSY: Prostatic adenocarcinoma, Gleason score 5+4=9 involves 80% of one core. Perineural invasion identified.    12/16/2021 Initial Diagnosis   Malignant neoplasm of prostate ([place])   06/11/2022 Genetic Testing   Negative genetics.  No pathogenic variants detected in [facility] CancerNext-Expanded +RNAinsight Panel.  Report date is 06/11/2022.   The CancerNext-Expanded gene panel offered by [facility] and includes sequencing, rearrangement, and RNA analysis for the following 77 genes: AIP, ALK, APC, ATM, AXIN2, BAP1, BARD1, BLM, BMPR1A, BRCA1, BRCA2, BRIP1, CDC73, CDH1, CDK4, CDKN1B, CDKN2A, CHEK2, CTNNA1, DICER1, FANCC, FH, FLCN, GALNT12, KIF1B, LZTR1, MAX, MEN1, MET, MLH1, MSH2, MSH3, MSH6, MUTYH, NBN, NF1, NF2, NTHL1, PALB2, PHOX2B, PMS2, POT1, PRKAR1A, PTCH1, PTEN, RAD51C, RAD51D, RB1, RECQL, RET, SDHA, SDHAF2, SDHB, SDHC, SDHD, SMAD4, SMARCA4, SMARCB1, SMARCE1, STK11, SUFU, TMEM127, TP53, TSC1, TSC2, VHL and XRCC2 (sequencing and deletion/duplication); EGFR, EGLN1, HOXB13, KIT, MITF, PDGFRA, POLD1, and POLE (sequencing only); EPCAM and GREM1 (deletion/duplication only).       HISTORY OF PRESENTING ILLNESS: *** Antonio Navarro 62 y.o. male is a here because of Malignant neoplasm of prostate. The patient was referred by ***. The  patient presents to the clinic today  accompanied by ***.   {Story of what lead them here}   Today the patient notes they felt/feeling prior/after...   {She/He} has a PMHx of....    Socially...    REVIEW OF SYSTEMS:   *** Constitutional: Denies fevers, chills or abnormal night sweats Eyes: Denies blurriness of vision, double vision or watery eyes Ears, nose, mouth, throat, and face: Denies mucositis or sore throat Respiratory: Denies cough, dyspnea or wheezes Cardiovascular: Denies palpitation, chest discomfort or lower extremity swelling Gastrointestinal:  Denies nausea, heartburn or change in bowel habits Skin: Denies abnormal skin rashes Lymphatics: Denies new lymphadenopathy or easy bruising Neurological:Denies numbness, tingling or new weaknesses Behavioral/Psych: Mood is stable, no new changes  All other systems were reviewed with the patient and are negative.   MEDICAL HISTORY:  Past Medical History:  Diagnosis Date   Anoxic brain injury ([place]) 07/22/2020   cardiac arrest for 10 minutes prior to resuscitationed by EMS , residual unable to write, gait instability (uses wheelchair) has speech impediment but can communicate   CAD (coronary artery disease), native coronary artery 07/2020   cardiologist--- dr Gasper Sells;    07-22-2020 s/p cardiac arrest in setting of STEMI with cath showing 40%pRCA and 95% mLCx ,  DES x1 to mLCx   Dysphagia    intermittant   Family history of adverse reaction to anesthesia    sister--  hard to wake   Gait instability    residual from brain injury ,  uses wheelchair   GERD (gastroesophageal reflux disease)    History of kidney stones    History of ST elevation myocardial infarction (STEMI) 07/22/2020   inferolateral ---  VDRF  shocked 6 times by EMS, intubated,  ef 40%,  post PCI Stent to CFx, 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;   pt had prolonged hospitalization   HLD (hyperlipidemia)    Hypertension     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   Ischemic cardiomyopathy 07/2020   07-22-2020  EF 40% at time of MI;   08-06-2021  ef 55-60% per echo   Malignant neoplasm prostate [facility]) 10/2021   primary urologist-- dr borden/  oncologist--- dr shadad/  radiation oncology-- dr Tammi Klippel;  dx 03/ 2023,  Gleason 5+4, PSA 10-81   Nocturia    S/P drug eluting coronary stent placement 07/22/2021   DES x 1 to midCFx   Speech problem    pt is able to communicate needs but has a little impediment   Urinary incontinence    uses depends   Uses wheelchair    uses wheelchair due to gait instability,  can stand and pivot with minminal assist    SURGICAL HISTORY: Past Surgical History:  Procedure Laterality Date   CORONARY/GRAFT ACUTE MI REVASCULARIZATION N/A 07/22/2020   Procedure: Coronary/Graft Acute MI Revascularization;  Surgeon: Lorretta Harp, MD;  Location: [place] [facility];  Service: Cardiovascular;  Laterality: N/A;   GOLD SEED IMPLANT N/A 02/26/2022   Procedure: GOLD SEED IMPLANT;  Surgeon: Ceasar Mons, MD;  Location: [facility];  Service: Urology;  Laterality: N/A;  30 MINS FOR THIS CASE   IR GASTROSTOMY TUBE MOD SED  08/17/2020   IR GASTROSTOMY TUBE REMOVAL  11/08/2020   LEFT HEART CATH AND CORONARY ANGIOGRAPHY N/A 07/22/2020   Procedure: LEFT HEART CATH AND CORONARY ANGIOGRAPHY;  Surgeon: Lorretta Harp, MD;  Location: [place] CV [facility];  Service: Cardiovascular;  Laterality: N/A;   PROSTATE BIOPSY N/A 11/13/2021  Procedure: BIOPSY TRANSRECTAL ULTRASONIC PROSTATE (TUBP);  Surgeon: Vira Agar, MD;  Location: [facility];  Service: Urology;  Laterality: N/A;  46 MINS FOR THIS CASE   RADIOLOGY WITH ANESTHESIA N/A 10/16/2021   Procedure: MRI WITH ANESTHESIA OF PROSTATE WITH AND WITHOUT CONTRAST;  Surgeon: Radiologist, Medication, MD;  Location: [place];  Service: Radiology;  Laterality: N/A;   SPACE OAR INSTILLATION N/A 02/26/2022   Procedure: SPACE OAR  INSTILLATION;  Surgeon: Ceasar Mons, MD;  Location: [facility];  Service: Urology;  Laterality: N/A;    SOCIAL HISTORY: Social History   Socioeconomic History   Marital status: Single    Spouse name: Not on file   Number of children: Not on file   Years of education: Not on file   Highest education level: Not on file  Occupational History   Not on file  Tobacco Use   Smoking status: Former    Packs/day: 1.00    Years: 40.00    Total pack years: 40.00    Types: Cigarettes    Quit date: 07/21/2020    Years since quitting: 2.1   Smokeless tobacco: Never  Vaping Use   Vaping Use: Never used  Substance and Sexual Activity   Alcohol use: Not Currently    Comment: none since 12/ 2021   Drug use: Not Currently    Types: Marijuana    Comment: none since 12/ 2021   Sexual activity: Not on file  Other Topics Concern   Not on file  Social History Narrative   Not on file   Social Determinants of Health   Financial Resource Strain: Not on file  Food Insecurity: Not on file  Transportation Needs: Not on file  Physical Activity: Not on file  Stress: Not on file  Social Connections: Not on file  Intimate Partner Violence: Not on file    FAMILY HISTORY: Family History  Problem Relation Age of Onset   Cancer Mother        d. age 3; unknown type   Hypertension Father    Congestive Heart Failure Father    Breast cancer Sister 92   Diabetes type II Sister    Hypertension Sister    Hypertension Brother    Autism spectrum disorder Brother    Ovarian cancer Maternal Aunt        dx before age 75   Prostate cancer Maternal Uncle        dx after 63   Breast cancer Paternal Aunt        dx 67s   Cancer Paternal Aunt        dx after 63; unknown type   Ovarian cancer Paternal Grandmother        dx and d. mid 40s    ALLERGIES:  has No Known Allergies.  MEDICATIONS:  Current Outpatient Medications  Medication Sig Dispense Refill   acetaminophen  (TYLENOL) 500 MG tablet Take 500 mg by mouth every 6 (six) hours as needed.     aspirin 81 MG chewable tablet Chew 1 tablet (81 mg total) by mouth daily.     atorvastatin (LIPITOR) 80 MG tablet Take 1 tablet (80 mg total) by mouth daily. (Patient taking differently: Take 80 mg by mouth at bedtime.) 90 tablet 3   clopidogrel (PLAVIX) 75 MG tablet Take 1 tablet by mouth once daily 90 tablet 0   losartan (COZAAR) 25 MG tablet Take 1 tablet (25 mg total) by mouth daily. (Patient taking differently: Take 25  mg by mouth daily.) 90 tablet 3   metoprolol succinate (TOPROL-XL) 25 MG 24 hr tablet Take 0.5 tablets (12.5 mg total) by mouth 2 (two) times daily. 90 tablet 3   polyethylene glycol (MIRALAX / GLYCOLAX) 17 g packet Take 17 g by mouth daily as needed for moderate constipation. 14 each 0   predniSONE (DELTASONE) 5 MG tablet Take 1 tablet (5 mg total) by mouth daily with breakfast. 90 tablet 1   No current facility-administered medications for this visit.    PHYSICAL EXAMINATION: ECOG PERFORMANCE STATUS: {CHL ONC ECOG [phone]}  There were no vitals filed for this visit. There were no vitals filed for this visit. *** GENERAL:alert, no distress and comfortable SKIN: skin color, texture, turgor are normal, no rashes or significant lesions EYES: normal, Conjunctiva are pink and non-injected, sclera clear {OROPHARYNX:no exudate, no erythema and lips, buccal mucosa, and tongue normal}  NECK: supple, thyroid normal size, non-tender, without nodularity LYMPH:  no palpable lymphadenopathy in the cervical, axillary {or inguinal} LUNGS: clear to auscultation and percussion with normal breathing effort HEART: regular rate & rhythm and no murmurs and no lower extremity edema ABDOMEN:abdomen soft, non-tender and normal bowel sounds Musculoskeletal:no cyanosis of digits and no clubbing  NEURO: alert & oriented x 3 with fluent speech, no focal motor/sensory deficits  LABORATORY DATA:  I have  reviewed the data as listed    Latest Ref Rng & Units 02/14/2022   11:48 AM 11/06/2021    2:56 PM 04/06/2021   10:48 PM  CBC  WBC 4.0 - 10.5 K/uL 5.2  4.9  6.7   Hemoglobin 13.0 - 17.0 g/dL 13.9  12.9  13.2   Hematocrit 39.0 - 52.0 % 41.6  38.7  39.5   Platelets 150 - 400 K/uL 152  153  156        Latest Ref Rng & Units 02/14/2022   11:48 AM 11/06/2021    2:56 PM 10/16/2021    8:29 AM  CMP  Glucose 70 - 99 mg/dL 215  94  87   BUN 6 - 20 mg/dL '21  16  16   '$ Creatinine 0.61 - 1.24 mg/dL 1.14  0.97  1.23   Sodium 135 - 145 mmol/L 145  141  142   Potassium 3.5 - 5.1 mmol/L 4.1  4.2  3.9   Chloride 98 - 111 mmol/L 108  108  107   CO2 22 - 32 mmol/L '31  27  27   '$ Calcium 8.9 - 10.3 mg/dL 9.9  9.3  9.2   Total Protein 6.5 - 8.1 g/dL 7.2     Total Bilirubin 0.3 - 1.2 mg/dL 0.7     Alkaline Phos 38 - 126 U/L 62     AST 15 - 41 U/L 15     ALT 0 - 44 U/L 25        RADIOGRAPHIC STUDIES: I have personally reviewed the radiological images as listed and agreed with the findings in the report. No results found.   No orders of the defined types were placed in this encounter.   All questions were answered. The patient knows to call the clinic with any problems, questions or concerns. The total time spent in the appointment was {CHL ONC TIME VISIT - [id number].     Baldemar Friday, [place] 09/17/2022 12:27 PM  I, Audry Riles am acting as scribe for Truitt Merle, MD.   {Add scribe attestation statement}

## 2022-09-18 ENCOUNTER — Inpatient Hospital Stay: Payer: Medicaid Other | Admitting: Hematology

## 2022-09-18 ENCOUNTER — Telehealth: Payer: Self-pay | Admitting: Hematology

## 2022-09-18 DIAGNOSIS — C61 Malignant neoplasm of prostate: Secondary | ICD-10-CM

## 2022-09-18 NOTE — Telephone Encounter (Signed)
Pt's sister called in to cancel appt with Dr. Burr Medico today. She declined to r/s at this time. She stated she would call back at a later date to r/s.

## 2022-11-19 ENCOUNTER — Other Ambulatory Visit: Payer: Self-pay

## 2022-11-19 MED ORDER — LOSARTAN POTASSIUM 25 MG PO TABS: 25.0000 mg | ORAL_TABLET | Freq: Every day | ORAL | 3 refills | Status: DC

## 2022-12-01 ENCOUNTER — Other Ambulatory Visit: Payer: Self-pay | Admitting: Cardiology

## 2023-02-06 ENCOUNTER — Telehealth: Payer: Self-pay | Admitting: *Deleted

## 2023-02-06 NOTE — Telephone Encounter (Signed)
TCT patient's sisters, Ishmeal Lenton and Laural Roes. Called to get a sense of what their expectations were for tomorrow's visit. Pt has not been seen in this clinic since 04/2022 and the decision at that time was for comfort, quality of life care vs. life extending treatments. They state that Marcellis has been referred back her due to rising PSA of 5.4.  Pt did not complete radiation treatments last year He did only 6 treatments out 30 or so. The family would like to know his current extend of disease and what else could be done.  Advised that we will see him tomorrow.

## 2023-02-07 ENCOUNTER — Inpatient Hospital Stay: Payer: Medicaid Other | Admitting: Hematology and Oncology

## 2023-02-07 ENCOUNTER — Inpatient Hospital Stay: Payer: Medicaid Other | Attending: Hematology

## 2023-02-07 ENCOUNTER — Telehealth: Payer: Self-pay | Admitting: Hematology and Oncology

## 2023-02-07 ENCOUNTER — Telehealth: Payer: Self-pay | Admitting: *Deleted

## 2023-02-07 ENCOUNTER — Other Ambulatory Visit: Payer: Self-pay

## 2023-02-07 ENCOUNTER — Other Ambulatory Visit: Payer: Self-pay | Admitting: Hematology and Oncology

## 2023-02-07 DIAGNOSIS — C61 Malignant neoplasm of prostate: Secondary | ICD-10-CM | POA: Insufficient documentation

## 2023-02-07 LAB — CMP (CANCER CENTER ONLY)
ALT: 10 U/L (ref 0–44)
AST: 13 U/L — ABNORMAL LOW (ref 15–41)
Albumin: 3.9 g/dL (ref 3.5–5.0)
Alkaline Phosphatase: 47 U/L (ref 38–126)
Anion gap: 6 (ref 5–15)
BUN: 13 mg/dL (ref 6–20)
CO2: 30 mmol/L (ref 22–32)
Calcium: 10 mg/dL (ref 8.9–10.3)
Chloride: 105 mmol/L (ref 98–111)
Creatinine: 1.27 mg/dL — ABNORMAL HIGH (ref 0.61–1.24)
GFR, Estimated: >60 mL/min (ref >60)
Glucose, Bld: 185 mg/dL — ABNORMAL HIGH (ref 70–99)
Potassium: 4 mmol/L (ref 3.5–5.1)
Sodium: 141 mmol/L (ref 135–145)
Total Bilirubin: 0.7 mg/dL (ref 0.3–1.2)
Total Protein: 6.7 g/dL (ref 6.5–8.1)

## 2023-02-07 LAB — CBC WITH DIFFERENTIAL (CANCER CENTER ONLY)
Abs Immature Granulocytes: 0.01 10*3/uL (ref 0.00–0.07)
Basophils Absolute: 0 10*3/uL (ref 0.0–0.1)
Basophils Relative: 1 %
Eosinophils Absolute: 0.1 10*3/uL (ref 0.0–0.5)
Eosinophils Relative: 2 %
HCT: 35.2 % — ABNORMAL LOW (ref 39.0–52.0)
Hemoglobin: 11.7 g/dL — ABNORMAL LOW (ref 13.0–17.0)
Immature Granulocytes: 0 %
Lymphocytes Relative: 28 %
Lymphs Abs: 1 10*3/uL (ref 0.7–4.0)
MCH: 31 pg (ref 26.0–34.0)
MCHC: 33.2 g/dL (ref 30.0–36.0)
MCV: 93.4 fL (ref 80.0–100.0)
Monocytes Absolute: 0.2 10*3/uL (ref 0.1–1.0)
Monocytes Relative: 7 %
Neutro Abs: 2.1 10*3/uL (ref 1.7–7.7)
Neutrophils Relative %: 62 %
Platelet Count: 158 10*3/uL (ref 150–400)
RBC: 3.77 MIL/uL — ABNORMAL LOW (ref 4.22–5.81)
RDW: 12.6 % (ref 11.5–15.5)
WBC Count: 3.4 10*3/uL — ABNORMAL LOW (ref 4.0–10.5)
nRBC: 0 % (ref 0.0–0.2)

## 2023-02-07 NOTE — Progress Notes (Unsigned)
[facility] [phone]   [phone]  INITIAL CONSULT NOTE  Patient Care Team: Rocky Morel, DO as PCP - General Quintella Reichert, MD as PCP - Cardiology (Cardiology) Pcp, No  Hematological/Oncological History # ***  CHIEF COMPLAINTS/PURPOSE OF CONSULTATION:  "*** "  HISTORY OF PRESENTING ILLNESS:  Antonio Navarro 62 y.o. male with medical history significant for ***  On review of the previous records ***  On exam today ***  MEDICAL HISTORY:  Past Medical History:  Diagnosis Date   Anoxic brain injury (HCC) 07/22/2020   cardiac arrest for 10 minutes prior to resuscitationed by EMS , residual unable to write, gait instability (uses wheelchair) has speech impediment but can communicate   CAD (coronary artery disease), native coronary artery 07/2020   cardiologist--- dr Izora Ribas;    07-22-2020 s/p cardiac arrest in setting of STEMI with cath showing 40%pRCA and 95% mLCx ,  DES x1 to mLCx   Dysphagia    intermittant   Family history of adverse reaction to anesthesia    sister--  hard to wake   Gait instability    residual from brain injury ,  uses wheelchair   GERD (gastroesophageal reflux disease)    History of kidney stones    History of ST elevation myocardial infarction (STEMI) 07/22/2020   inferolateral ---  VDRF  shocked 6 times by EMS, intubated,  ef 40%,  post PCI Stent to CFx, 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;   pt had prolonged hospitalization   HLD (hyperlipidemia)    Hypertension    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   Ischemic cardiomyopathy 07/2020   07-22-2020  EF 40% at time of MI;   08-06-2021  ef 55-60% per echo   Malignant neoplasm prostate [facility]) 10/2021   primary urologist-- dr borden/  oncologist--- dr shadad/  radiation oncology-- dr Kathrynn Running;  dx 03/ 2023,  Gleason 5+4, PSA 10-81   Nocturia    S/P drug eluting coronary stent placement 07/22/2021   DES x 1 to midCFx    Speech problem    pt is able to communicate needs but has a little impediment   Urinary incontinence    uses depends   Uses wheelchair    uses wheelchair due to gait instability,  can stand and pivot with minminal assist    SURGICAL HISTORY: Past Surgical History:  Procedure Laterality Date   CORONARY/GRAFT ACUTE MI REVASCULARIZATION N/A 07/22/2020   Procedure: Coronary/Graft Acute MI Revascularization;  Surgeon: Runell Gess, MD;  Location: [facility] INVASIVE CV LAB;  Service: Cardiovascular;  Laterality: N/A;   GOLD SEED IMPLANT N/A 02/26/2022   Procedure: GOLD SEED IMPLANT;  Surgeon: Rene Paci, MD;  Location: [facility];  Service: Urology;  Laterality: N/A;  30 MINS FOR THIS CASE   IR GASTROSTOMY TUBE MOD SED  08/17/2020   IR GASTROSTOMY TUBE REMOVAL  11/08/2020   LEFT HEART CATH AND CORONARY ANGIOGRAPHY N/A 07/22/2020   Procedure: LEFT HEART CATH AND CORONARY ANGIOGRAPHY;  Surgeon: Runell Gess, MD;  Location: [facility] INVASIVE CV LAB;  Service: Cardiovascular;  Laterality: N/A;   PROSTATE BIOPSY N/A 11/13/2021   Procedure: BIOPSY TRANSRECTAL ULTRASONIC PROSTATE (TUBP);  Surgeon: Despina Arias, MD;  Location: [facility];  Service: Urology;  Laterality: N/A;  45 MINS FOR THIS CASE   RADIOLOGY WITH ANESTHESIA N/A 10/16/2021   Procedure: MRI WITH ANESTHESIA OF PROSTATE WITH AND WITHOUT CONTRAST;  Surgeon: Radiologist, Medication,  MD;  Location: [facility] OR;  Service: Radiology;  Laterality: N/A;   SPACE OAR INSTILLATION N/A 02/26/2022   Procedure: SPACE OAR INSTILLATION;  Surgeon: Rene Paci, MD;  Location: [facility];  Service: Urology;  Laterality: N/A;    SOCIAL HISTORY: Social History   Socioeconomic History   Marital status: Single    Spouse name: Not on file   Number of children: Not on file   Years of education: Not on file   Highest education level: Not on file  Occupational History   Not on file  Tobacco Use   Smoking  status: Former    Packs/day: 1.00    Years: 40.00    Additional pack years: 0.00    Total pack years: 40.00    Types: Cigarettes    Quit date: 07/21/2020    Years since quitting: 2.5   Smokeless tobacco: Never  Vaping Use   Vaping Use: Never used  Substance and Sexual Activity   Alcohol use: Not Currently    Comment: none since 12/ 2021   Drug use: Not Currently    Types: Marijuana    Comment: none since 12/ 2021   Sexual activity: Not on file  Other Topics Concern   Not on file  Social History Narrative   Not on file   Social Determinants of Health   Financial Resource Strain: Not on file  Food Insecurity: Not on file  Transportation Needs: Not on file  Physical Activity: Not on file  Stress: Not on file  Social Connections: Not on file  Intimate Partner Violence: Not on file    FAMILY HISTORY: Family History  Problem Relation Age of Onset   Cancer Mother        d. age 26; unknown type   Hypertension Father    Congestive Heart Failure Father    Breast cancer Sister 72   Diabetes type II Sister    Hypertension Sister    Hypertension Brother    Autism spectrum disorder Brother    Ovarian cancer Maternal Aunt        dx before age 28   Prostate cancer Maternal Uncle        dx after 30   Breast cancer Paternal Aunt        dx 97s   Cancer Paternal Aunt        dx after 10; unknown type   Ovarian cancer Paternal Grandmother        dx and d. mid 84s    ALLERGIES:  has No Known Allergies.  MEDICATIONS:  Current Outpatient Medications  Medication Sig Dispense Refill   acetaminophen (TYLENOL) 500 MG tablet Take 500 mg by mouth every 6 (six) hours as needed.     aspirin 81 MG chewable tablet Chew 1 tablet (81 mg total) by mouth daily.     atorvastatin (LIPITOR) 80 MG tablet Take 1 tablet (80 mg total) by mouth daily. (Patient taking differently: Take 80 mg by mouth at bedtime.) 90 tablet 3   clopidogrel (PLAVIX) 75 MG tablet Take 1 tablet by mouth once daily 90  tablet 0   losartan (COZAAR) 25 MG tablet Take 1 tablet (25 mg total) by mouth daily. 90 tablet 3   metoprolol succinate (TOPROL-XL) 25 MG 24 hr tablet Take 0.5 tablets (12.5 mg total) by mouth 2 (two) times daily. 90 tablet 3   polyethylene glycol (MIRALAX / GLYCOLAX) 17 g packet Take 17 g by mouth daily as needed for moderate constipation.  14 each 0   predniSONE (DELTASONE) 5 MG tablet Take 1 tablet (5 mg total) by mouth daily with breakfast. 90 tablet 1   No current facility-administered medications for this visit.    REVIEW OF SYSTEMS:   Constitutional: ( - ) fevers, ( - )  chills , ( - ) night sweats Eyes: ( - ) blurriness of vision, ( - ) double vision, ( - ) watery eyes Ears, nose, mouth, throat, and face: ( - ) mucositis, ( - ) sore throat Respiratory: ( - ) cough, ( - ) dyspnea, ( - ) wheezes Cardiovascular: ( - ) palpitation, ( - ) chest discomfort, ( - ) lower extremity swelling Gastrointestinal:  ( - ) nausea, ( - ) heartburn, ( - ) change in bowel habits Skin: ( - ) abnormal skin rashes Lymphatics: ( - ) new lymphadenopathy, ( - ) easy bruising Neurological: ( - ) numbness, ( - ) tingling, ( - ) new weaknesses Behavioral/Psych: ( - ) mood change, ( - ) new changes  All other systems were reviewed with the patient and are negative.  PHYSICAL EXAMINATION: ECOG PERFORMANCE STATUS: {CHL ONC ECOG [id number]}  There were no vitals filed for this visit. There were no vitals filed for this visit.  GENERAL: well appearing *** in NAD  SKIN: skin color, texture, turgor are normal, no rashes or significant lesions EYES: conjunctiva are pink and non-injected, sclera clear OROPHARYNX: no exudate, no erythema; lips, buccal mucosa, and tongue normal  NECK: supple, non-tender LYMPH:  no palpable lymphadenopathy in the cervical, axillary or supraclavicular lymph nodes.  LUNGS: clear to auscultation and percussion with normal breathing effort HEART: regular rate & rhythm and no  murmurs and no lower extremity edema ABDOMEN: soft, non-tender, non-distended, normal bowel sounds Musculoskeletal: no cyanosis of digits and no clubbing  PSYCH: alert & oriented x 3, fluent speech NEURO: no focal motor/sensory deficits  LABORATORY DATA:  I have reviewed the data as listed    Latest Ref Rng & Units 02/14/2022   11:48 AM 11/06/2021    2:56 PM 04/06/2021   10:48 PM  CBC  WBC 4.0 - 10.5 K/uL 5.2  4.9  6.7   Hemoglobin 13.0 - 17.0 g/dL 08.6  57.8  46.9   Hematocrit 39.0 - 52.0 % 41.6  38.7  39.5   Platelets 150 - 400 K/uL 152  153  156        Latest Ref Rng & Units 02/14/2022   11:48 AM 11/06/2021    2:56 PM 10/16/2021    8:29 AM  CMP  Glucose 70 - 99 mg/dL 629  94  87   BUN 6 - 20 mg/dL 21  16  16    Creatinine 0.61 - 1.24 mg/dL 5.28  4.13  2.44   Sodium 135 - 145 mmol/L 145  141  142   Potassium 3.5 - 5.1 mmol/L 4.1  4.2  3.9   Chloride 98 - 111 mmol/L 108  108  107   CO2 22 - 32 mmol/L 31  27  27    Calcium 8.9 - 10.3 mg/dL 9.9  9.3  9.2   Total Protein 6.5 - 8.1 g/dL 7.2     Total Bilirubin 0.3 - 1.2 mg/dL 0.7     Alkaline Phos 38 - 126 U/L 62     AST 15 - 41 U/L 15     ALT 0 - 44 U/L 25        PATHOLOGY: ***  BLOOD FILM: ***  Review of the peripheral blood smear showed normal appearing white cells with neutrophils that were appropriately lobated and granulated. There was no predominance of bi-lobed or hyper-segmented neutrophils appreciated. No Dohle bodies were noted. There was no left shifting, immature forms or blasts noted. Lymphocytes remain normal in size without any predominance of large granular lymphocytes. Red cells show no anisopoikilocytosis, macrocytes , microcytes or polychromasia. There were no schistocytes, target cells, echinocytes, acanthocytes, dacrocytes, or stomatocytes.There was no rouleaux formation, nucleated red cells, or intra-cellular inclusions noted. The platelets are normal in size, shape, and color without any clumping  evident.  RADIOGRAPHIC STUDIES: I have personally reviewed the radiological images as listed and agreed with the findings in the report. No results found.  ASSESSMENT & PLAN ***  After review of the labs, review of the records, and discussion with the patient the patients findings are most consistent with ***  No orders of the defined types were placed in this encounter.   All questions were answered. The patient knows to call the clinic with any problems, questions or concerns.  A total of more than 60 minutes were spent on this encounter with face-to-face time and non-face-to-face time, including preparing to see the patient, ordering tests and/or medications, counseling the patient and coordination of care as outlined above.   Ulysees Barns, MD Department of Hematology/Oncology [facility] Phone: [phone] Pager: [phone] Email: [email or web address]  02/07/2023 7:22 AM

## 2023-02-07 NOTE — Telephone Encounter (Signed)
Contacted pt's sister to R/S his appt with Dr. Leonides Schanz per staff chat below. Unable to reach via phone, voicemail was left requesting that she give me a call back for scheduling.   Elizabeth A. [facility], RN: This is a new pt for Dr. Leonides Schanz. He showed up 45 minutes late. We got his labs but he will need to have the MD appt rescheduled Call the sister [name]

## 2023-02-07 NOTE — Telephone Encounter (Signed)
Spoke with pt's sister Merita Norton is the lobby. Advised that as they were 45 minutes late for the new patient visit with Dr. Leonides Schanz, we will have to reschedule the MD visit. Advised that he can have his labs done today. Advised that a new patient scheduler will be in touch with MD appt. [name] voiced understanding.  Dr. Leonides Schanz aware of the above and in agreement to reschedule clinic visit.

## 2023-02-09 LAB — TESTOSTERONE: Testosterone: 33 ng/dL — ABNORMAL LOW (ref 264–916)

## 2023-02-09 LAB — PROSTATE-SPECIFIC AG, SERUM (LABCORP): Prostate Specific Ag, Serum: 7 ng/mL — ABNORMAL HIGH (ref 0.0–4.0)

## 2023-03-12 ENCOUNTER — Telehealth: Payer: Self-pay | Admitting: Hematology and Oncology

## 2023-03-17 ENCOUNTER — Inpatient Hospital Stay: Payer: Medicaid Other

## 2023-03-17 ENCOUNTER — Other Ambulatory Visit: Payer: Self-pay

## 2023-03-17 ENCOUNTER — Inpatient Hospital Stay: Payer: Medicaid Other | Attending: Hematology | Admitting: Hematology and Oncology

## 2023-03-17 ENCOUNTER — Inpatient Hospital Stay: Payer: Medicaid Other | Admitting: Hematology and Oncology

## 2023-03-17 VITALS — BP 90/74 | HR 97 | Temp 97.5°F | Resp 17 | Wt 142.6 lb

## 2023-03-17 DIAGNOSIS — Z803 Family history of malignant neoplasm of breast: Secondary | ICD-10-CM | POA: Insufficient documentation

## 2023-03-17 DIAGNOSIS — C61 Malignant neoplasm of prostate: Secondary | ICD-10-CM | POA: Diagnosis not present

## 2023-03-17 DIAGNOSIS — Z87891 Personal history of nicotine dependence: Secondary | ICD-10-CM | POA: Diagnosis not present

## 2023-03-17 DIAGNOSIS — Z79818 Long term (current) use of other agents affecting estrogen receptors and estrogen levels: Secondary | ICD-10-CM | POA: Insufficient documentation

## 2023-03-17 DIAGNOSIS — Z8041 Family history of malignant neoplasm of ovary: Secondary | ICD-10-CM | POA: Diagnosis not present

## 2023-03-17 DIAGNOSIS — Z809 Family history of malignant neoplasm, unspecified: Secondary | ICD-10-CM | POA: Diagnosis not present

## 2023-03-17 NOTE — Progress Notes (Signed)
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 Noted to have clinical stage T3bN1 with regional lymph node involvement  12/25/2021: Eligard 45 mg started 01/05/2022: Zytiga 1000 mg with prednisone 5 mg daily started  02/14/2022: last visit with Dr. Clelia Croft. Transitioned to comfort based care after completing 6 (out of 30) radiation treatments  03/17/2023: establish care with Dr. Leonides Schanz   Interval History:  Antonio Navarro 62 y.o. male with medical history significant for castrate sensitive prostate cancer who presents for a follow up visit. The patient's last visit was on 02/14/2022. In the interim since the last visit he has not been seen after discontinuing radiation therapy and Zytiga.  On exam today Mr. Antonio Navarro reports he has been receiving Lupron shots alone however his PSA is creeping up.  He has been getting 1 every 6 months and his last one was 1 to 2 months ago.  He reports that he had to stop the Zytiga pills because he was unable to swallow them.  He does not take a lot of his medications p.o.  He reports that he is not currently having any difficulties with pain.  He has been losing weight and is down 22 pounds from January of this year.  He does report however his appetite is good.  He is not having any issues with runny nose, sore throat or cough.  He denies any fevers, chills, sweats.  Full 10 point ROS is otherwise negative.  MEDICAL HISTORY:  Past Medical History:  Diagnosis Date   Anoxic brain injury (HCC) 07/22/2020   cardiac arrest for 10 minutes prior to resuscitationed by EMS , residual unable to write, gait instability (uses wheelchair) has speech impediment but can  communicate   CAD (coronary artery disease), native coronary artery 07/2020   cardiologist--- dr Izora Ribas;    07-22-2020 s/p cardiac arrest in setting of STEMI with cath showing 40%pRCA and 95% mLCx ,  DES x1 to mLCx   Dysphagia    intermittant   Family history of adverse reaction to anesthesia    sister--  hard to wake   Gait instability    residual from brain injury ,  uses wheelchair   GERD (gastroesophageal reflux disease)    History of kidney stones    History of ST elevation myocardial infarction (STEMI) 07/22/2020   inferolateral ---  VDRF  shocked 6 times by EMS, intubated,  ef 40%,  post PCI Stent to CFx, 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;   pt had prolonged hospitalization   HLD (hyperlipidemia)    Hypertension    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   Ischemic cardiomyopathy 07/2020   07-22-2020  EF 40% at time of MI;   08-06-2021  ef 55-60% per echo   Malignant neoplasm prostate [facility]) 10/2021   primary urologist-- dr borden/  oncologist--- dr shadad/  radiation oncology-- dr Kathrynn Running;  dx 03/ 2023,  Gleason 5+4, PSA 10-81   Nocturia    S/P drug eluting coronary stent placement 07/22/2021   DES x 1 to midCFx   Speech problem    pt is able to communicate needs but has a little impediment   Urinary incontinence  uses depends   Uses wheelchair    uses wheelchair due to gait instability,  can stand and pivot with minminal assist    SURGICAL HISTORY: Past Surgical History:  Procedure Laterality Date   CORONARY/GRAFT ACUTE MI REVASCULARIZATION N/A 07/22/2020   Procedure: Coronary/Graft Acute MI Revascularization;  Surgeon: Runell Gess, MD;  Location: [facility] INVASIVE CV LAB;  Service: Cardiovascular;  Laterality: N/A;   GOLD SEED IMPLANT N/A 02/26/2022   Procedure: GOLD SEED IMPLANT;  Surgeon: Rene Paci, MD;  Location: [facility];  Service: Urology;  Laterality: N/A;  30 MINS FOR THIS CASE   IR  GASTROSTOMY TUBE MOD SED  08/17/2020   IR GASTROSTOMY TUBE REMOVAL  11/08/2020   LEFT HEART CATH AND CORONARY ANGIOGRAPHY N/A 07/22/2020   Procedure: LEFT HEART CATH AND CORONARY ANGIOGRAPHY;  Surgeon: Runell Gess, MD;  Location: [facility] INVASIVE CV LAB;  Service: Cardiovascular;  Laterality: N/A;   PROSTATE BIOPSY N/A 11/13/2021   Procedure: BIOPSY TRANSRECTAL ULTRASONIC PROSTATE (TUBP);  Surgeon: Despina Arias, MD;  Location: [facility];  Service: Urology;  Laterality: N/A;  45 MINS FOR THIS CASE   RADIOLOGY WITH ANESTHESIA N/A 10/16/2021   Procedure: MRI WITH ANESTHESIA OF PROSTATE WITH AND WITHOUT CONTRAST;  Surgeon: Radiologist, Medication, MD;  Location: [facility];  Service: Radiology;  Laterality: N/A;   SPACE OAR INSTILLATION N/A 02/26/2022   Procedure: SPACE OAR INSTILLATION;  Surgeon: Rene Paci, MD;  Location: [facility];  Service: Urology;  Laterality: N/A;    SOCIAL HISTORY: Social History   Socioeconomic History   Marital status: Single    Spouse name: Not on file   Number of children: Not on file   Years of education: Not on file   Highest education level: Not on file  Occupational History   Not on file  Tobacco Use   Smoking status: Former    Current packs/day: 0.00    Average packs/day: 1 pack/day for 40.0 years (40.0 ttl pk-yrs)    Types: Cigarettes    Start date: 07/21/1980    Quit date: 07/21/2020    Years since quitting: 2.6   Smokeless tobacco: Never  Vaping Use   Vaping status: Never Used  Substance and Sexual Activity   Alcohol use: Not Currently    Comment: none since 12/ 2021   Drug use: Not Currently    Types: Marijuana    Comment: none since 12/ 2021   Sexual activity: Not on file  Other Topics Concern   Not on file  Social History Narrative   Not on file   Social Determinants of Health   Financial Resource Strain: Not on file  Food Insecurity: No Food Insecurity (03/17/2023)   Hunger Vital Sign    Worried About Running  Out of Food in the Last Year: Never true    Ran Out of Food in the Last Year: Never true  Transportation Needs: No Transportation Needs (03/17/2023)   PRAPARE - Administrator, Civil Service (Medical): No    Lack of Transportation (Non-Medical): No  Physical Activity: Not on file  Stress: Not on file  Social Connections: Not on file  Intimate Partner Violence: Patient Unable To Answer (03/17/2023)   Humiliation, Afraid, Rape, and Kick questionnaire    Fear of Current or Ex-Partner: Patient unable to answer    Emotionally Abused: Patient unable to answer    Physically Abused: Patient unable to answer    Sexually Abused: Patient unable to  answer    FAMILY HISTORY: Family History  Problem Relation Age of Onset   Cancer Mother        d. age 79; unknown type   Hypertension Father    Congestive Heart Failure Father    Breast cancer Sister 72   Diabetes type II Sister    Hypertension Sister    Hypertension Brother    Autism spectrum disorder Brother    Ovarian cancer Maternal Aunt        dx before age 55   Prostate cancer Maternal Uncle        dx after 36   Breast cancer Paternal Aunt        dx 93s   Cancer Paternal Aunt        dx after 68; unknown type   Ovarian cancer Paternal Grandmother        dx and d. mid 32s    ALLERGIES:  has No Known Allergies.  MEDICATIONS:  Current Outpatient Medications  Medication Sig Dispense Refill   acetaminophen (TYLENOL) 500 MG tablet Take 500 mg by mouth every 6 (six) hours as needed.     aspirin 81 MG chewable tablet Chew 1 tablet (81 mg total) by mouth daily.     atorvastatin (LIPITOR) 80 MG tablet Take 1 tablet (80 mg total) by mouth daily. (Patient taking differently: Take 80 mg by mouth at bedtime.) 90 tablet 3   clopidogrel (PLAVIX) 75 MG tablet Take 1 tablet by mouth once daily 90 tablet 0   enzalutamide (XTANDI) 40 MG tablet Take 4 tablets (160 mg total) by mouth daily. 120 tablet 2   losartan (COZAAR) 25 MG tablet Take  1 tablet (25 mg total) by mouth daily. 90 tablet 3   metoprolol succinate (TOPROL-XL) 25 MG 24 hr tablet Take 0.5 tablets (12.5 mg total) by mouth 2 (two) times daily. 90 tablet 3   polyethylene glycol (MIRALAX / GLYCOLAX) 17 g packet Take 17 g by mouth daily as needed for moderate constipation. 14 each 0   No current facility-administered medications for this visit.    REVIEW OF SYSTEMS:   Constitutional: ( - ) fevers, ( - )  chills , ( - ) night sweats Eyes: ( - ) blurriness of vision, ( - ) double vision, ( - ) watery eyes Ears, nose, mouth, throat, and face: ( - ) mucositis, ( - ) sore throat Respiratory: ( - ) cough, ( - ) dyspnea, ( - ) wheezes Cardiovascular: ( - ) palpitation, ( - ) chest discomfort, ( - ) lower extremity swelling Gastrointestinal:  ( - ) nausea, ( - ) heartburn, ( - ) change in bowel habits Skin: ( - ) abnormal skin rashes Lymphatics: ( - ) new lymphadenopathy, ( - ) easy bruising Neurological: ( - ) numbness, ( - ) tingling, ( - ) new weaknesses Behavioral/Psych: ( - ) mood change, ( - ) new changes  All other systems were reviewed with the patient and are negative.  PHYSICAL EXAMINATION:  Vitals:   03/17/23 1312  BP: 90/74  Pulse: 97  Resp: 17  Temp: (!) 97.5 F (36.4 C)  SpO2: 100%   Filed Weights   03/17/23 1312  Weight: 142 lb 9.6 oz (64.7 kg)    GENERAL: Chronically ill-appearing middle-aged African-American male, alert, no distress and comfortable SKIN: skin color, texture, turgor are normal, no rashes or significant lesions EYES: conjunctiva are pink and non-injected, sclera clear LUNGS: clear to auscultation and percussion with normal breathing  effort HEART: regular rate & rhythm and no murmurs and no lower extremity edema Musculoskeletal: no cyanosis of digits and no clubbing  PSYCH: alert & oriented x 3, fluent speech NEURO: no focal motor/sensory deficits  LABORATORY DATA:  I have reviewed the data as listed    Latest Ref Rng &  Units 02/07/2023    9:47 AM 02/14/2022   11:48 AM 11/06/2021    2:56 PM  CBC  WBC 4.0 - 10.5 K/uL 3.4  5.2  4.9   Hemoglobin 13.0 - 17.0 g/dL 16.1  09.6  04.5   Hematocrit 39.0 - 52.0 % 35.2  41.6  38.7   Platelets 150 - 400 K/uL 158  152  153        Latest Ref Rng & Units 02/07/2023    9:47 AM 02/14/2022   11:48 AM 11/06/2021    2:56 PM  CMP  Glucose 70 - 99 mg/dL 409  811  94   BUN 6 - 20 mg/dL 13  21  16    Creatinine 0.61 - 1.24 mg/dL 9.14  7.82  9.56   Sodium 135 - 145 mmol/L 141  145  141   Potassium 3.5 - 5.1 mmol/L 4.0  4.1  4.2   Chloride 98 - 111 mmol/L 105  108  108   CO2 22 - 32 mmol/L 30  31  27    Calcium 8.9 - 10.3 mg/dL 21.3  9.9  9.3   Total Protein 6.5 - 8.1 g/dL 6.7  7.2    Total Bilirubin 0.3 - 1.2 mg/dL 0.7  0.7    Alkaline Phos 38 - 126 U/L 47  62    AST 15 - 41 U/L 13  15    ALT 0 - 44 U/L 10  25     RADIOGRAPHIC STUDIES: No results found.  ASSESSMENT & PLAN Antonio Navarro 62 y.o. male with medical history significant for castrate sensitive prostate cancer who presents for a follow up visit.  # Castrate Sensitive Prostate Cancer -- Currently receives Lupron shots with urology.  Has been receiving Eligard every 6 months with the last one being 1 to 2 months ago. -- PSA is steadily increasing, up to 7.0 today. -- Patient unable to tolerate Zytiga pills due to their size.  Discussed with pharmacy who noted that enzalutamide would be the smallest oral therapy we could try.  Will prescribe enzalutamide 160 mg p.o. daily. -- Testosterone is at castrate level, however it is still 33. -- Recommend return to clinic in approximately 6 weeks time after he has been on the enzalutamide therapy to assure he is tolerating it well.  Orders Placed This Encounter  Procedures   NM PET (PSMA) SKULL TO MID THIGH    Standing Status:   Future    Standing Expiration Date:   03/16/2024    Order Specific Question:   If indicated for the ordered procedure, I authorize the  administration of a radiopharmaceutical per Radiology protocol    Answer:   Yes    Order Specific Question:   Preferred imaging location?    Answer:   [facility]    All questions were answered. The patient knows to call the clinic with any problems, questions or concerns.  A total of more than 40 minutes were spent on this encounter with face-to-face time and non-face-to-face time, including preparing to see the patient, ordering tests and/or medications, counseling the patient and coordination of care as outlined above.   Ulysees Barns,  MD Department of Hematology/Oncology [facility] at [facility] Phone: [phone] Pager: [phone] Email: [email or web address]  03/29/2023 7:23 AM

## 2023-03-18 ENCOUNTER — Telehealth: Payer: Self-pay | Admitting: Pharmacist

## 2023-03-18 ENCOUNTER — Other Ambulatory Visit (HOSPITAL_COMMUNITY): Payer: Self-pay

## 2023-03-18 ENCOUNTER — Other Ambulatory Visit: Payer: Self-pay

## 2023-03-18 DIAGNOSIS — C61 Malignant neoplasm of prostate: Secondary | ICD-10-CM

## 2023-03-18 MED ORDER — ENZALUTAMIDE 40 MG PO TABS
160.0000 mg | ORAL_TABLET | Freq: Every day | ORAL | 2 refills | Status: DC
Start: 2023-03-18 — End: 2023-06-10
  Filled 2023-03-18 – 2023-03-19 (×2): qty 120, 30d supply, fill #0
  Filled 2023-04-07: qty 120, 30d supply, fill #1
  Filled 2023-05-06: qty 120, 30d supply, fill #2

## 2023-03-18 NOTE — Telephone Encounter (Signed)
Oral Oncology Pharmacist Encounter  Received new prescription for Xtandi (enzalutamide) for the treatment of metastatic castration sensitive prostate cancer in conjunction with ADT, planned duration until disease progression or unacceptable drug toxicity. (Of note patient previously on Zytiga in 2023, but this was discontinued as patient had difficulty with swallowing Zytiga tablets).   CBC w/ Diff and CMP from 02/07/23 assessed, patient with Scr of 1.27 mg/dL (CrCl ~27.2 mL/min) - no baseline dose adjustments required at this time. PSA on 02/07/23 7 ng/mL (up from 3.3 ng/mL a year ago). Prescription dose and frequency assessed for appropriateness.  Current medication list in Epic reviewed, DDIs with Xtandi identified: Category C drug-drug interaction between Xtandi and Atorvastatin - Xtandi, a strong CYP3A4 inducer, may decrease serum concentrations of atorvastatin - recommend monitoring patient for decreased atorvastatin efficacy. No change in therapy warranted at this time.  Category C drug-drug interaction between Xtandi and Clopidogrel - Clopidogrel, a moderate CYP2C8 inhibitor, may increase serum concentrations of Xtandi. Recommend monitoring patient for decreased side effects from [place], including but not limited to, fatigue, HTN, edema, etc. No change in therapy warranted at this time.   Evaluated chart and no patient barriers to medication adherence noted.   Prescription has been e-scribed to the [facility] for benefits analysis and approval.  Oral Oncology Clinic will continue to follow for insurance authorization, copayment issues, initial counseling and start date.  Lenord Carbo, PharmD, BCPS, BCOP Hematology/Oncology Clinical Pharmacist [facility] Oral Chemotherapy Navigation Clinics [phone] 03/18/2023 10:39 AM

## 2023-03-19 ENCOUNTER — Other Ambulatory Visit (HOSPITAL_COMMUNITY): Payer: Self-pay

## 2023-03-19 ENCOUNTER — Other Ambulatory Visit: Payer: Self-pay

## 2023-03-19 NOTE — Telephone Encounter (Signed)
Oral Chemotherapy Pharmacist Encounter  I spoke with patient's wife, [name], for overview of: Diana Eves (enzalutamide) for the treatment of metastatic castration sensitive prostate cancer in conjunction with ADT, planned duration until disease progression or unacceptable toxicity.   Counseled patient's wife on administration, dosing, side effects, monitoring, drug-food interactions, safe handling, storage, and disposal.  Patient will take Xtandi 40mg  tablets, 4 tablets (160mg  total) by mouth once daily without regard to food.  Patient's wife aware that patient will need to avoid grapefruit and grapefruit juice, seville oranges and star fruit while on Xtandi.  Xtandi start date: 03/21/23  Adverse effects include but are not limited to: peripheral edema, hypertension, hot flashes, fatigue, and arthralgias.    Reviewed with patient's wife importance of keeping a medication schedule and plan for any missed doses. No barriers to medication adherence identified.  Medication reconciliation performed and medication/allergy list updated.  All questions answered.  Patient's wife voiced understanding and appreciation.   Medication education handout placed in mail for patient and patient's wife. Patient and patient's family knows to call the office with questions or concerns. Oral Chemotherapy Clinic phone number provided.   Lenord Carbo, PharmD, BCPS, [facility] Hematology/Oncology Clinical Pharmacist [facility] Oral Chemotherapy Navigation Clinics [phone] 03/19/2023 9:37 AM

## 2023-04-03 ENCOUNTER — Encounter (HOSPITAL_COMMUNITY)
Admission: RE | Admit: 2023-04-03 | Discharge: 2023-04-03 | Disposition: A | Discharge status: Home / Self Care | Payer: Medicaid Other | Source: Ambulatory Visit | Attending: Hematology and Oncology | Admitting: Hematology and Oncology

## 2023-04-03 DIAGNOSIS — C61 Malignant neoplasm of prostate: Secondary | ICD-10-CM | POA: Insufficient documentation

## 2023-04-03 MED ORDER — PIFLIFOLASTAT F 18 (PYLARIFY) INJECTION
9.0000 | Freq: Once | INTRAVENOUS | Status: AC
  Administered 2023-04-03: 9.85 via INTRAVENOUS

## 2023-04-07 ENCOUNTER — Other Ambulatory Visit: Payer: Self-pay

## 2023-04-11 ENCOUNTER — Other Ambulatory Visit (HOSPITAL_COMMUNITY): Payer: Self-pay

## 2023-04-30 ENCOUNTER — Other Ambulatory Visit (HOSPITAL_COMMUNITY): Payer: Self-pay

## 2023-04-30 ENCOUNTER — Other Ambulatory Visit: Payer: Self-pay | Admitting: *Deleted

## 2023-04-30 ENCOUNTER — Inpatient Hospital Stay: Payer: Medicaid Other | Attending: Hematology

## 2023-04-30 ENCOUNTER — Inpatient Hospital Stay (HOSPITAL_BASED_OUTPATIENT_CLINIC_OR_DEPARTMENT_OTHER): Payer: Medicaid Other | Admitting: Hematology and Oncology

## 2023-04-30 ENCOUNTER — Other Ambulatory Visit: Payer: Self-pay | Admitting: Hematology and Oncology

## 2023-04-30 VITALS — BP 123/89 | HR 101 | Temp 97.3°F | Resp 16 | Wt 136.1 lb

## 2023-04-30 DIAGNOSIS — Z8042 Family history of malignant neoplasm of prostate: Secondary | ICD-10-CM | POA: Insufficient documentation

## 2023-04-30 DIAGNOSIS — R634 Abnormal weight loss: Secondary | ICD-10-CM | POA: Diagnosis not present

## 2023-04-30 DIAGNOSIS — C61 Malignant neoplasm of prostate: Secondary | ICD-10-CM | POA: Insufficient documentation

## 2023-04-30 DIAGNOSIS — Z79899 Other long term (current) drug therapy: Secondary | ICD-10-CM | POA: Diagnosis not present

## 2023-04-30 DIAGNOSIS — Z87891 Personal history of nicotine dependence: Secondary | ICD-10-CM | POA: Insufficient documentation

## 2023-04-30 DIAGNOSIS — Z809 Family history of malignant neoplasm, unspecified: Secondary | ICD-10-CM | POA: Insufficient documentation

## 2023-04-30 DIAGNOSIS — Z803 Family history of malignant neoplasm of breast: Secondary | ICD-10-CM | POA: Insufficient documentation

## 2023-04-30 LAB — CBC WITH DIFFERENTIAL (CANCER CENTER ONLY)
Abs Immature Granulocytes: 0 10*3/uL (ref 0.00–0.07)
Basophils Absolute: 0.1 10*3/uL (ref 0.0–0.1)
Basophils Relative: 2 %
Eosinophils Absolute: 0.1 10*3/uL (ref 0.0–0.5)
Eosinophils Relative: 2 %
HCT: 31 % — ABNORMAL LOW (ref 39.0–52.0)
Hemoglobin: 10.6 g/dL — ABNORMAL LOW (ref 13.0–17.0)
Immature Granulocytes: 0 %
Lymphocytes Relative: 17 %
Lymphs Abs: 0.7 10*3/uL (ref 0.7–4.0)
MCH: 31.9 pg (ref 26.0–34.0)
MCHC: 34.2 g/dL (ref 30.0–36.0)
MCV: 93.4 fL (ref 80.0–100.0)
Monocytes Absolute: 0.3 10*3/uL (ref 0.1–1.0)
Monocytes Relative: 7 %
Neutro Abs: 2.8 10*3/uL (ref 1.7–7.7)
Neutrophils Relative %: 72 %
Platelet Count: 154 10*3/uL (ref 150–400)
RBC: 3.32 MIL/uL — ABNORMAL LOW (ref 4.22–5.81)
RDW: 12.8 % (ref 11.5–15.5)
WBC Count: 3.9 10*3/uL — ABNORMAL LOW (ref 4.0–10.5)
nRBC: 0 % (ref 0.0–0.2)

## 2023-04-30 LAB — CMP (CANCER CENTER ONLY)
ALT: 17 U/L (ref 0–44)
AST: 19 U/L (ref 15–41)
Albumin: 3.9 g/dL (ref 3.5–5.0)
Alkaline Phosphatase: 56 U/L (ref 38–126)
Anion gap: 5 (ref 5–15)
BUN: 13 mg/dL (ref 8–23)
CO2: 28 mmol/L (ref 22–32)
Calcium: 9.6 mg/dL (ref 8.9–10.3)
Chloride: 108 mmol/L (ref 98–111)
Creatinine: 1.07 mg/dL (ref 0.61–1.24)
GFR, Estimated: >60 mL/min (ref >60)
Glucose, Bld: 132 mg/dL — ABNORMAL HIGH (ref 70–99)
Potassium: 4 mmol/L (ref 3.5–5.1)
Sodium: 141 mmol/L (ref 135–145)
Total Bilirubin: 0.6 mg/dL (ref 0.3–1.2)
Total Protein: 6.9 g/dL (ref 6.5–8.1)

## 2023-04-30 NOTE — Progress Notes (Signed)
[facility] [phone]   [phone]  PROGRESS NOTE  Patient Care Team: Rocky Morel, DO as PCP - General Quintella Reichert, MD as PCP - Cardiology (Cardiology) Pcp, No  Hematological/Oncological History # Castrate Sensitive Prostate Cancer 11/13/2021: prostate biopsy performed with Gleason score 5+4 = 9 in all 12 cores. Noted to have clinical stage T3bN1 with regional lymph node involvement  12/25/2021: Eligard 45 mg started 01/05/2022: Zytiga 1000 mg with prednisone 5 mg daily started  02/14/2022: last visit with Dr. Clelia Croft. Transitioned to comfort based care after completing 6 (out of 30) radiation treatments  03/17/2023: establish care with Dr. Leonides Schanz   Interval History:  Antonio Navarro 62 y.o. male with medical history significant for castrate sensitive prostate cancer who presents for a follow up visit. The patient's last visit was on 03/17/2023. In the interim since the last visit he has started enzalutamide therapy.  On exam today Antonio Navarro is accompanied by his family.  They report that he is taking his enzalutamide pills 4 pills daily and tolerating it well.  They are concerned that he is "slobbering a bit".  They think it may be due to the metallic taste of his pills.  He is also continued to lose weight and is down 7 pounds from our last visit.  They notes he is eating about 2 meals per day and is also drinking boost.  He is not having any clear nausea, vomiting, or diarrhea.  He is not having any shortness of breath or indicating that he is in pain.  He drinks about 1-2 boost per day.  He reports that he eats a relatively small portion.  Overall they are most concerned about his weight loss but he has developed no other new symptoms in the interim since our last visit he is not having any issues with runny nose, sore throat or cough.  He denies any fevers, chills, sweats.  Full 10 point ROS is otherwise negative.  MEDICAL HISTORY:  Past Medical  History:  Diagnosis Date   Anoxic brain injury (HCC) 07/22/2020   cardiac arrest for 10 minutes prior to resuscitationed by EMS , residual unable to write, gait instability (uses wheelchair) has speech impediment but can communicate   CAD (coronary artery disease), native coronary artery 07/2020   cardiologist--- dr Izora Ribas;    07-22-2020 s/p cardiac arrest in setting of STEMI with cath showing 40%pRCA and 95% mLCx ,  DES x1 to mLCx   Dysphagia    intermittant   Family history of adverse reaction to anesthesia    sister--  hard to wake   Gait instability    residual from brain injury ,  uses wheelchair   GERD (gastroesophageal reflux disease)    History of kidney stones    History of ST elevation myocardial infarction (STEMI) 07/22/2020   inferolateral ---  VDRF  shocked 6 times by EMS, intubated,  ef 40%,  post PCI Stent to CFx, 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;   pt had prolonged hospitalization   HLD (hyperlipidemia)    Hypertension    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   Ischemic cardiomyopathy 07/2020   07-22-2020  EF 40% at time of MI;   08-06-2021  ef 55-60% per echo   Malignant neoplasm prostate [facility]) 10/2021   primary urologist-- dr borden/  oncologist--- dr shadad/  radiation oncology-- dr Kathrynn Running;  dx 03/ 2023,  Gleason 5+4, PSA 10-81   Nocturia  S/P drug eluting coronary stent placement 07/22/2021   DES x 1 to midCFx   Speech problem    pt is able to communicate needs but has a little impediment   Urinary incontinence    uses depends   Uses wheelchair    uses wheelchair due to gait instability,  can stand and pivot with minminal assist    SURGICAL HISTORY: Past Surgical History:  Procedure Laterality Date   CORONARY/GRAFT ACUTE MI REVASCULARIZATION N/A 07/22/2020   Procedure: Coronary/Graft Acute MI Revascularization;  Surgeon: Runell Gess, MD;  [facility] INVASIVE CV LAB;  Service: Cardiovascular;  Laterality: N/A;    GOLD SEED IMPLANT N/A 02/26/2022   Procedure: GOLD SEED IMPLANT;  Surgeon: Rene Paci, MD;  Location: [facility];  Service: Urology;  Laterality: N/A;  30 MINS FOR THIS CASE   IR GASTROSTOMY TUBE MOD SED  08/17/2020   IR GASTROSTOMY TUBE REMOVAL  11/08/2020   LEFT HEART CATH AND CORONARY ANGIOGRAPHY N/A 07/22/2020   Procedure: LEFT HEART CATH AND CORONARY ANGIOGRAPHY;  Surgeon: Runell Gess, MD;  Location: [facility] INVASIVE CV LAB;  Service: Cardiovascular;  Laterality: N/A;   PROSTATE BIOPSY N/A 11/13/2021   Procedure: BIOPSY TRANSRECTAL ULTRASONIC PROSTATE (TUBP);  Surgeon: Despina Arias, MD;  Location: [facility];  Service: Urology;  Laterality: N/A;  45 MINS FOR THIS CASE   RADIOLOGY WITH ANESTHESIA N/A 10/16/2021   Procedure: MRI WITH ANESTHESIA OF PROSTATE WITH AND WITHOUT CONTRAST;  Surgeon: Radiologist, Medication, MD;  Location: [facility] OR;  Service: Radiology;  Laterality: N/A;   SPACE OAR INSTILLATION N/A 02/26/2022   Procedure: SPACE OAR INSTILLATION;  Surgeon: Rene Paci, MD;  Location: [facility];  Service: Urology;  Laterality: N/A;    SOCIAL HISTORY: Social History   Socioeconomic History   Marital status: Single    Spouse name: Not on file   Number of children: Not on file   Years of education: Not on file   Highest education level: Not on file  Occupational History   Not on file  Tobacco Use   Smoking status: Former    Current packs/day: 0.00    Average packs/day: 1 pack/day for 40.0 years (40.0 ttl pk-yrs)    Types: Cigarettes    Start date: 07/21/1980    Quit date: 07/21/2020    Years since quitting: 2.7   Smokeless tobacco: Never  Vaping Use   Vaping status: Never Used  Substance and Sexual Activity   Alcohol use: Not Currently    Comment: none since 12/ 2021   Drug use: Not Currently    Types: Marijuana    Comment: none since 12/ 2021   Sexual activity: Not on file  Other Topics Concern   Not on file   Social History Narrative   Not on file   Social Determinants of Health   Financial Resource Strain: Not on file  Food Insecurity: No Food Insecurity (03/17/2023)   Hunger Vital Sign    Worried About Running Out of Food in the Last Year: Never true    Ran Out of Food in the Last Year: Never true  Transportation Needs: No Transportation Needs (03/17/2023)   PRAPARE - Administrator, Civil Service (Medical): No    Lack of Transportation (Non-Medical): No  Physical Activity: Not on file  Stress: Not on file  Social Connections: Not on file  Intimate Partner Violence: Patient Unable To Answer (03/17/2023)   Humiliation, Afraid, Rape, and Kick  questionnaire    Fear of Current or Ex-Partner: Patient unable to answer    Emotionally Abused: Patient unable to answer    Physically Abused: Patient unable to answer    Sexually Abused: Patient unable to answer    FAMILY HISTORY: Family History  Problem Relation Age of Onset   Cancer Mother        d. age 58; unknown type   Hypertension Father    Congestive Heart Failure Father    Breast cancer Sister 25   Diabetes type II Sister    Hypertension Sister    Hypertension Brother    Autism spectrum disorder Brother    Ovarian cancer Maternal Aunt        dx before age 4   Prostate cancer Maternal Uncle        dx after 52   Breast cancer Paternal Aunt        dx 11s   Cancer Paternal Aunt        dx after 47; unknown type   Ovarian cancer Paternal Grandmother        dx and d. mid 62s    ALLERGIES:  has No Known Allergies.  MEDICATIONS:  Current Outpatient Medications  Medication Sig Dispense Refill   acetaminophen (TYLENOL) 500 MG tablet Take 500 mg by mouth every 6 (six) hours as needed.     aspirin 81 MG chewable tablet Chew 1 tablet (81 mg total) by mouth daily.     atorvastatin (LIPITOR) 80 MG tablet Take 1 tablet (80 mg total) by mouth daily. (Patient taking differently: Take 80 mg by mouth at bedtime.) 90 tablet 3    clopidogrel (PLAVIX) 75 MG tablet Take 1 tablet by mouth once daily 90 tablet 0   enzalutamide (XTANDI) 40 MG tablet Take 4 tablets (160 mg total) by mouth daily. 120 tablet 2   losartan (COZAAR) 25 MG tablet Take 1 tablet (25 mg total) by mouth daily. 90 tablet 3   metoprolol succinate (TOPROL-XL) 25 MG 24 hr tablet Take 0.5 tablets (12.5 mg total) by mouth 2 (two) times daily. 90 tablet 3   polyethylene glycol (MIRALAX / GLYCOLAX) 17 g packet Take 17 g by mouth daily as needed for moderate constipation. 14 each 0   No current facility-administered medications for this visit.    REVIEW OF SYSTEMS:   Constitutional: ( - ) fevers, ( - )  chills , ( - ) night sweats Eyes: ( - ) blurriness of vision, ( - ) double vision, ( - ) watery eyes Ears, nose, mouth, throat, and face: ( - ) mucositis, ( - ) sore throat Respiratory: ( - ) cough, ( - ) dyspnea, ( - ) wheezes Cardiovascular: ( - ) palpitation, ( - ) chest discomfort, ( - ) lower extremity swelling Gastrointestinal:  ( - ) nausea, ( - ) heartburn, ( - ) change in bowel habits Skin: ( - ) abnormal skin rashes Lymphatics: ( - ) new lymphadenopathy, ( - ) easy bruising Neurological: ( - ) numbness, ( - ) tingling, ( - ) new weaknesses Behavioral/Psych: ( - ) mood change, ( - ) new changes  All other systems were reviewed with the patient and are negative.  PHYSICAL EXAMINATION:  Vitals:   04/30/23 1150  BP: 123/89  Pulse: (!) 101  Resp: 16  Temp: (!) 97.3 F (36.3 C)  SpO2: 100%    Filed Weights   04/30/23 1150  Weight: 136 lb 1.6 oz (61.7 kg)  GENERAL: Chronically ill-appearing middle-aged African-American male, alert, no distress and comfortable SKIN: skin color, texture, turgor are normal, no rashes or significant lesions EYES: conjunctiva are pink and non-injected, sclera clear LUNGS: clear to auscultation and percussion with normal breathing effort HEART: regular rate & rhythm and no murmurs and no lower extremity  edema Musculoskeletal: no cyanosis of digits and no clubbing  PSYCH: minimally verbal  NEURO: no focal motor/sensory deficits  LABORATORY DATA:  I have reviewed the data as listed    Latest Ref Rng & Units 04/30/2023   11:31 AM 02/07/2023    9:47 AM 02/14/2022   11:48 AM  CBC  WBC 4.0 - 10.5 K/uL 3.9  3.4  5.2   Hemoglobin 13.0 - 17.0 g/dL 16.1  09.6  04.5   Hematocrit 39.0 - 52.0 % 31.0  35.2  41.6   Platelets 150 - 400 K/uL 154  158  152        Latest Ref Rng & Units 04/30/2023   11:31 AM 02/07/2023    9:47 AM 02/14/2022   11:48 AM  CMP  Glucose 70 - 99 mg/dL 409  811  914   BUN 8 - 23 mg/dL 13  13  21    Creatinine 0.61 - 1.24 mg/dL 7.82  9.56  2.13   Sodium 135 - 145 mmol/L 141  141  145   Potassium 3.5 - 5.1 mmol/L 4.0  4.0  4.1   Chloride 98 - 111 mmol/L 108  105  108   CO2 22 - 32 mmol/L 28  30  31    Calcium 8.9 - 10.3 mg/dL 9.6  08.6  9.9   Total Protein 6.5 - 8.1 g/dL 6.9  6.7  7.2   Total Bilirubin 0.3 - 1.2 mg/dL 0.6  0.7  0.7   Alkaline Phos 38 - 126 U/L 56  47  62   AST 15 - 41 U/L 19  13  15    ALT 0 - 44 U/L 17  10  25     RADIOGRAPHIC STUDIES: NM PET (PSMA) SKULL TO MID THIGH  Result Date: 04/04/2023 CLINICAL DATA:  Prostate carcinoma with biochemical recurrence. EXAM: NUCLEAR MEDICINE PET SKULL BASE TO THIGH TECHNIQUE: 9.9 mCi F18 Piflufolastat (Pylarify) was injected intravenously. Full-ring PET imaging was performed from the skull base to thigh after the radiotracer. CT data was obtained and used for attenuation correction and anatomic localization. COMPARISON:  PSMA PET scan 12/13/2021 FINDINGS: NECK No radiotracer activity in neck lymph nodes. Incidental CT finding: None. CHEST No radiotracer accumulation within mediastinal or hilar lymph nodes. No suspicious pulmonary nodules on the CT scan. Incidental CT finding: None. ABDOMEN/PELVIS Prostate: Intense radiotracer activity in the posterior aspect of the prostate gland towards the apex SUV max equal 20.8 on image  209. This activity is immediately adjacent to the ventral rectal wall. Difficult to tell if tissue is within the prostate gland or immediately dorsal to gland. Lymph nodes: Interval resolution of previous seen radiotracer avid lymph nodes adjacent to the rectum and proximal iliac arteries. No radiotracer avid or enlarged lymph nodes in the pelvis. No retroperitoneal metastatic adenopathy. Liver: No evidence of liver metastasis. Incidental CT finding: Large stool ball in rectum. LEFT-sided IVC. Atherosclerotic calcification of the aorta. SKELETON No focal activity to suggest skeletal metastasis. IMPRESSION: 1. Intense radiotracer activity in the posterior aspect of the prostate gland towards the apex consistent with RECURRENT PROSTATE ADENOCARCINOMA. prostate MRI or contrast CT may better characterize this tissue positioned between the prostate and  rectum. 2. Interval resolution of radiotracer avid lymph nodes in the pelvis. 3. No evidence of visceral metastasis or skeletal metastasis. 4.  Aortic Atherosclerosis (ICD10-I70.0). Electronically Signed   By: Genevive Bi M.D.   On: 04/04/2023 08:10    ASSESSMENT & PLAN Antonio Navarro 62 y.o. male with medical history significant for castrate sensitive prostate cancer who presents for a follow up visit.  # Castrate Sensitive Prostate Cancer -- Currently receives Lupron shots with urology.  Has been receiving Eligard every 6 months with the last one being 2 to 3 months ago. -- PSA is steadily increasing, up to 7.0. Repeat PSA and testosterone today.  -- Patient unable to tolerate Zytiga pills due to their size.  Discussed with pharmacy who noted that enzalutamide would be the smallest oral therapy we could try.   -- continue enzalutamide 160 mg p.o. daily. -- Testosterone is at castrate level at last check, noted to be at 33. -- Recommend return to clinic in approximately 4 weeks time  # Weight Loss --refer to Cancer Center dietician  No orders of the  defined types were placed in this encounter.   All questions were answered. The patient knows to call the clinic with any problems, questions or concerns.  A total of more than 30 minutes were spent on this encounter with face-to-face time and non-face-to-face time, including preparing to see the patient, ordering tests and/or medications, counseling the patient and coordination of care as outlined above.   Ulysees Barns, MD Department of Hematology/Oncology [facility] Phone: [phone] Pager: [phone] Email: [email or web address]  04/30/2023 1:42 PM

## 2023-05-01 LAB — PROSTATE-SPECIFIC AG, SERUM (LABCORP): Prostate Specific Ag, Serum: 0.2 ng/mL (ref 0.0–4.0)

## 2023-05-01 LAB — TESTOSTERONE: Testosterone: 62 ng/dL — ABNORMAL LOW (ref 264–916)

## 2023-05-02 ENCOUNTER — Telehealth: Payer: Self-pay | Admitting: *Deleted

## 2023-05-02 ENCOUNTER — Telehealth: Payer: Self-pay | Admitting: Dietician

## 2023-05-02 ENCOUNTER — Other Ambulatory Visit (HOSPITAL_COMMUNITY): Payer: Self-pay

## 2023-05-02 NOTE — Telephone Encounter (Signed)
Left message for patient to call back to schedule nutrition appointment per 9/13 inbasket message.

## 2023-05-02 NOTE — Telephone Encounter (Signed)
TCT patient regarding recent lab results. Spoke to his fiancee', [name] his PSA has dropped to 0.2 on the enzalutamide therapy. This is down from 7.0. Overall this is an excellent response to therapy. Will plan to see him back in 1 month as scheduled. [name] voiced understanding and will let Mr. Luedeman  know of these results.  She is aware of his next appt  in October.

## 2023-05-02 NOTE — Telephone Encounter (Signed)
-----   Message from [name] sent at 05/01/2023  9:04 AM EDT ----- Please let Antonio Navarro know (via his family) that his PSA has dropped to 0.2 on the enzalutamide therapy.  This is down from 7.0.  Overall this is an excellent response to therapy.  Will plan to see him back in 1 month as scheduled. ----- Message ----- From: Leory Plowman, Lab In [place] Sent: 04/30/2023  11:39 AM EDT To: Jaci Standard, MD

## 2023-05-05 ENCOUNTER — Encounter (HOSPITAL_COMMUNITY): Payer: Self-pay

## 2023-05-05 ENCOUNTER — Other Ambulatory Visit (HOSPITAL_COMMUNITY): Payer: Self-pay

## 2023-05-06 ENCOUNTER — Other Ambulatory Visit (HOSPITAL_COMMUNITY): Payer: Self-pay

## 2023-05-18 ENCOUNTER — Other Ambulatory Visit: Payer: Self-pay | Admitting: Cardiology

## 2023-06-03 ENCOUNTER — Other Ambulatory Visit: Payer: Self-pay | Admitting: Physician Assistant

## 2023-06-03 DIAGNOSIS — C61 Malignant neoplasm of prostate: Secondary | ICD-10-CM

## 2023-06-04 ENCOUNTER — Inpatient Hospital Stay: Payer: Medicaid Other | Admitting: Dietician

## 2023-06-04 ENCOUNTER — Inpatient Hospital Stay: Payer: Medicaid Other | Attending: Hematology

## 2023-06-04 ENCOUNTER — Inpatient Hospital Stay: Payer: Medicaid Other | Admitting: Physician Assistant

## 2023-06-05 ENCOUNTER — Other Ambulatory Visit (HOSPITAL_COMMUNITY): Payer: Self-pay

## 2023-06-09 ENCOUNTER — Other Ambulatory Visit: Payer: Self-pay

## 2023-06-10 ENCOUNTER — Other Ambulatory Visit: Payer: Self-pay

## 2023-06-10 ENCOUNTER — Other Ambulatory Visit: Payer: Self-pay | Admitting: Hematology and Oncology

## 2023-06-10 ENCOUNTER — Other Ambulatory Visit (HOSPITAL_COMMUNITY): Payer: Self-pay

## 2023-06-10 DIAGNOSIS — C61 Malignant neoplasm of prostate: Secondary | ICD-10-CM

## 2023-06-10 MED ORDER — ENZALUTAMIDE 40 MG PO TABS
160.0000 mg | ORAL_TABLET | Freq: Every day | ORAL | 2 refills | Status: DC
  Filled 2023-06-10: qty 120, 30d supply, fill #0
  Filled 2023-07-02: qty 120, 30d supply, fill #1
  Filled 2023-07-24: qty 120, 30d supply, fill #2

## 2023-06-10 NOTE — Progress Notes (Signed)
Specialty Pharmacy Refill Coordination Note  Antonio Navarro is a 62 y.o. male, [name] was contacted today regarding refills of specialty medication(s) Enzalutamide   Patient requested Delivery   Delivery date: 06/13/23   Verified address: [address]   [place] [address]   Medication will be filled on 06/12/23 pending a refill request.

## 2023-06-18 ENCOUNTER — Telehealth: Payer: Self-pay | Admitting: Hematology and Oncology

## 2023-06-19 ENCOUNTER — Other Ambulatory Visit: Payer: Self-pay | Admitting: Cardiology

## 2023-06-30 ENCOUNTER — Other Ambulatory Visit (HOSPITAL_COMMUNITY): Payer: Self-pay

## 2023-07-02 ENCOUNTER — Other Ambulatory Visit: Payer: Self-pay

## 2023-07-02 NOTE — Progress Notes (Signed)
Specialty Pharmacy Ongoing Clinical Assessment Note  Antonio Navarro is a 62 y.o. male who is being followed by the specialty pharmacy service for RxSp Oncology   Patient's specialty medication(s) reviewed today: Enzalutamide   Missed doses in the last 4 weeks: 0   Patient/Caregiver did not have any additional questions or concerns.   Therapeutic benefit summary: Patient is achieving benefit   Adverse events/side effects summary: Experienced adverse events/side effects (decreased appetite; encouraged to speak with cancer dietition)   Patient's therapy is appropriate to: Continue Patient not taking medication as instructed. Per fiance he was taking Xtandi 40 mg qid and not 160 mg at once. Encouraged to take 4 tablets at once as instructed.    Goals Addressed             This Visit's Progress    Slow Disease Progression       Patient is on track. Patient will maintain adherence and adhere to provider and/or lab appointments         Follow up:  6 months  Willean Schurman [facility] Specialty Pharmacist

## 2023-07-02 NOTE — Progress Notes (Signed)
Specialty Pharmacy Refill Coordination Note  Antonio Navarro is a 62 y.o. male contacted today regarding refills of specialty medication(s) Enzalutamide   Patient requested Delivery   Delivery date: 07/08/23   Verified address: [address] [place], [place] [address]   Medication will be filled on 07/08/23.

## 2023-07-03 ENCOUNTER — Telehealth: Payer: Self-pay | Admitting: Hematology and Oncology

## 2023-07-03 NOTE — Telephone Encounter (Signed)
Patient's care giver had requested to cancel appointment with [facility], patient's caregiver is also aware of rescheduled appointment times/dates

## 2023-07-04 ENCOUNTER — Encounter: Payer: Medicaid Other | Admitting: Dietician

## 2023-07-04 ENCOUNTER — Other Ambulatory Visit: Payer: Medicaid Other

## 2023-07-04 ENCOUNTER — Ambulatory Visit: Payer: Medicaid Other | Admitting: Hematology and Oncology

## 2023-07-08 ENCOUNTER — Other Ambulatory Visit: Payer: Self-pay

## 2023-07-14 ENCOUNTER — Other Ambulatory Visit: Payer: Self-pay | Admitting: Cardiology

## 2023-07-23 ENCOUNTER — Other Ambulatory Visit: Payer: Self-pay | Admitting: Student

## 2023-07-23 ENCOUNTER — Other Ambulatory Visit: Payer: Self-pay

## 2023-07-23 NOTE — Telephone Encounter (Signed)
Pt has been trying to get  his refill for the past week with no response.   clopidogrel (PLAVIX) 75 MG tablet   [facility] - [place] ([place]), [place] - [address]

## 2023-07-23 NOTE — Telephone Encounter (Signed)
Next appt scheduled 12/19 with PCP.  

## 2023-07-24 ENCOUNTER — Other Ambulatory Visit (HOSPITAL_COMMUNITY): Payer: Self-pay

## 2023-07-24 ENCOUNTER — Other Ambulatory Visit (HOSPITAL_COMMUNITY): Payer: Self-pay | Admitting: Pharmacy Technician

## 2023-07-24 NOTE — Progress Notes (Signed)
Specialty Pharmacy Refill Coordination Note  Antonio Navarro is a 62 y.o. male contacted today regarding refills of specialty medication(s) Enzalutamide  Spoke with [name]  Patient requested Delivery   Delivery date: 08/06/23   Verified address: [address] [place]   Medication will be filled on 08/05/23.

## 2023-07-28 ENCOUNTER — Other Ambulatory Visit: Payer: Self-pay | Admitting: Cardiology

## 2023-07-28 ENCOUNTER — Telehealth: Payer: Self-pay | Admitting: Cardiology

## 2023-07-28 ENCOUNTER — Other Ambulatory Visit (HOSPITAL_BASED_OUTPATIENT_CLINIC_OR_DEPARTMENT_OTHER): Payer: Self-pay | Admitting: Cardiology

## 2023-07-28 MED ORDER — CLOPIDOGREL BISULFATE 75 MG PO TABS: 75.0000 mg | ORAL_TABLET | Freq: Every day | ORAL | 0 refills | Status: DC

## 2023-07-28 NOTE — Telephone Encounter (Signed)
*  STAT* If patient is at the pharmacy, call can be transferred to refill team.   1. Which medications need to be refilled? (please list name of each medication and dose if known)   clopidogrel (PLAVIX) 75 MG tablet     4. Which pharmacy/location (including street and city if local pharmacy) is medication to be sent to? [facility] - [place] ([place]), [place] - [address]     5. Do they need a 30 day or 90 day supply? 90  Pt is scheduled for 1/8 with Alden Server, NP

## 2023-07-29 ENCOUNTER — Telehealth: Payer: Self-pay | Admitting: *Deleted

## 2023-07-29 MED ORDER — CLOPIDOGREL BISULFATE 75 MG PO TABS: 75.0000 mg | ORAL_TABLET | Freq: Every day | ORAL | 0 refills | Status: DC

## 2023-07-29 NOTE — Telephone Encounter (Signed)
Called and talked to pt's Antonio Navarro, informed pt's PCP  will refill Plavix but for next refill, to call pt's cardiologist, Dr Mayford Knife. Stated she understood.

## 2023-07-29 NOTE — Telephone Encounter (Signed)
Returned call to sister Koleen Nimrod after message left earlier today with following information: Patient was in deep sleep this morning. He also has seen some blood in his urine. She stated he had an appt on Thursday this week. No answer at sister's # provided [phone]. LVM that office had received message and would contact her.

## 2023-07-31 ENCOUNTER — Inpatient Hospital Stay: Payer: Medicaid Other | Attending: Hematology

## 2023-07-31 ENCOUNTER — Inpatient Hospital Stay: Payer: Medicaid Other | Admitting: Hematology and Oncology

## 2023-07-31 VITALS — BP 135/95 | HR 123 | Resp 123

## 2023-07-31 DIAGNOSIS — C61 Malignant neoplasm of prostate: Secondary | ICD-10-CM | POA: Insufficient documentation

## 2023-07-31 DIAGNOSIS — Z87891 Personal history of nicotine dependence: Secondary | ICD-10-CM | POA: Diagnosis not present

## 2023-07-31 LAB — CBC WITH DIFFERENTIAL (CANCER CENTER ONLY)
Abs Immature Granulocytes: 0.02 10*3/uL (ref 0.00–0.07)
Basophils Absolute: 0.1 10*3/uL (ref 0.0–0.1)
Basophils Relative: 1 %
Eosinophils Absolute: 0 10*3/uL (ref 0.0–0.5)
Eosinophils Relative: 1 %
HCT: 34.8 % — ABNORMAL LOW (ref 39.0–52.0)
Hemoglobin: 11.9 g/dL — ABNORMAL LOW (ref 13.0–17.0)
Immature Granulocytes: 0 %
Lymphocytes Relative: 11 %
Lymphs Abs: 0.7 10*3/uL (ref 0.7–4.0)
MCH: 33.1 pg (ref 26.0–34.0)
MCHC: 34.2 g/dL (ref 30.0–36.0)
MCV: 96.9 fL (ref 80.0–100.0)
Monocytes Absolute: 0.5 10*3/uL (ref 0.1–1.0)
Monocytes Relative: 7 %
Neutro Abs: 5.1 10*3/uL (ref 1.7–7.7)
Neutrophils Relative %: 80 %
Platelet Count: 196 10*3/uL (ref 150–400)
RBC: 3.59 MIL/uL — ABNORMAL LOW (ref 4.22–5.81)
RDW: 12.6 % (ref 11.5–15.5)
WBC Count: 6.3 10*3/uL (ref 4.0–10.5)
nRBC: 0 % (ref 0.0–0.2)

## 2023-07-31 LAB — CMP (CANCER CENTER ONLY)
ALT: 6 U/L (ref 0–44)
AST: 12 U/L — ABNORMAL LOW (ref 15–41)
Albumin: 4.1 g/dL (ref 3.5–5.0)
Alkaline Phosphatase: 59 U/L (ref 38–126)
Anion gap: 7 (ref 5–15)
BUN: 29 mg/dL — ABNORMAL HIGH (ref 8–23)
CO2: 30 mmol/L (ref 22–32)
Calcium: 10.1 mg/dL (ref 8.9–10.3)
Chloride: 110 mmol/L (ref 98–111)
Creatinine: 1.28 mg/dL — ABNORMAL HIGH (ref 0.61–1.24)
GFR, Estimated: >60 mL/min (ref >60)
Glucose, Bld: 133 mg/dL — ABNORMAL HIGH (ref 70–99)
Potassium: 4.2 mmol/L (ref 3.5–5.1)
Sodium: 147 mmol/L — ABNORMAL HIGH (ref 135–145)
Total Bilirubin: 0.6 mg/dL (ref <1.2)
Total Protein: 7.8 g/dL (ref 6.5–8.1)

## 2023-07-31 NOTE — Progress Notes (Signed)
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 Noted to have clinical stage T3bN1 with regional lymph node involvement  12/25/2021: Eligard 45 mg started 01/05/2022: Zytiga 1000 mg with prednisone 5 mg daily started  02/14/2022: last visit with Dr. Clelia Croft. Transitioned to comfort based care after completing 6 (out of 30) radiation treatments  03/17/2023: establish care with Dr. Leonides Schanz   Interval History:  Antonio Navarro 62 y.o. male with medical history significant for castrate sensitive prostate cancer who presents for a follow up visit. The patient's last visit was on 03/17/2023. In the interim since the last visit he has started enzalutamide therapy.  On exam today Antonio Navarro is accompanied by his family.  They report that he is taking his enzalutamide pills 4 pills daily and tolerating it well.  He is not having any side effects.  For the last 2-1/2 days he has had some blood in his urine.  He reports that there is a pink tinge in his urine.  They are concerned that he may not be able to provide a urine sample.  He does not appear to be in any pain when urinating and denies any back pain or infectious symptoms.  He is not having any fevers, chills, sweats.  He does follow with [facility].  He reports that he has been sleeping a little bit more though he does not appear uncomfortable.  Overall they are willing and able to continue on his enzalutamide therapy at this time.  He denies any fevers, chills, sweats.  Full 10 point ROS is otherwise negative.  MEDICAL HISTORY:  Past Medical History:  Diagnosis Date   Anoxic brain injury (HCC) 07/22/2020   cardiac arrest for 10 minutes  prior to resuscitationed by EMS , residual unable to write, gait instability (uses wheelchair) has speech impediment but can communicate   CAD (coronary artery disease), native coronary artery 07/2020   cardiologist--- dr Izora Ribas;    07-22-2020 s/p cardiac arrest in setting of STEMI with cath showing 40%pRCA and 95% mLCx ,  DES x1 to mLCx   Dysphagia    intermittant   Family history of adverse reaction to anesthesia    sister--  hard to wake   Gait instability    residual from brain injury ,  uses wheelchair   GERD (gastroesophageal reflux disease)    History of kidney stones    History of ST elevation myocardial infarction (STEMI) 07/22/2020   inferolateral ---  VDRF  shocked 6 times by EMS, intubated,  ef 40%,  post PCI Stent to CFx, 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;   pt had prolonged hospitalization   HLD (hyperlipidemia)    Hypertension    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   Ischemic cardiomyopathy 07/2020   07-22-2020  EF 40% at time of MI;   08-06-2021  ef 55-60% per echo   Malignant neoplasm prostate [facility]) 10/2021   primary urologist-- dr borden/  oncologist--- dr shadad/  radiation oncology-- dr Kathrynn Running;  dx 03/ 2023,  Gleason 5+4, PSA 10-81   Nocturia    S/P drug eluting coronary stent placement 07/22/2021   DES x 1 to midCFx   Speech problem  pt is able to communicate needs but has a little impediment   Urinary incontinence    uses depends   Uses wheelchair    uses wheelchair due to gait instability,  can stand and pivot with minminal assist    SURGICAL HISTORY: Past Surgical History:  Procedure Laterality Date   CORONARY/GRAFT ACUTE MI REVASCULARIZATION N/A 07/22/2020   Procedure: Coronary/Graft Acute MI Revascularization;  Surgeon: Runell Gess, MD;  Location: [facility] INVASIVE CV LAB;  Service: Cardiovascular;  Laterality: N/A;   GOLD SEED IMPLANT N/A 02/26/2022   Procedure: GOLD SEED IMPLANT;  Surgeon: Rene Paci, MD;  Location: [facility];  Service: Urology;  Laterality: N/A;  30 MINS FOR THIS CASE   IR GASTROSTOMY TUBE MOD SED  08/17/2020   IR GASTROSTOMY TUBE REMOVAL  11/08/2020   LEFT HEART CATH AND CORONARY ANGIOGRAPHY N/A 07/22/2020   Procedure: LEFT HEART CATH AND CORONARY ANGIOGRAPHY;  Surgeon: Runell Gess, MD;  Location: [facility] INVASIVE CV LAB;  Service: Cardiovascular;  Laterality: N/A;   PROSTATE BIOPSY N/A 11/13/2021   Procedure: BIOPSY TRANSRECTAL ULTRASONIC PROSTATE (TUBP);  Surgeon: Despina Arias, MD;  Location: [facility];  Service: Urology;  Laterality: N/A;  45 MINS FOR THIS CASE   RADIOLOGY WITH ANESTHESIA N/A 10/16/2021   Procedure: MRI WITH ANESTHESIA OF PROSTATE WITH AND WITHOUT CONTRAST;  Surgeon: Radiologist, Medication, MD;  Location: [facility] OR;  Service: Radiology;  Laterality: N/A;   SPACE OAR INSTILLATION N/A 02/26/2022   Procedure: SPACE OAR INSTILLATION;  Surgeon: Rene Paci, MD;  Location: [facility];  Service: Urology;  Laterality: N/A;    SOCIAL HISTORY: Social History   Socioeconomic History   Marital status: Single    Spouse name: Not on file   Number of children: Not on file   Years of education: Not on file   Highest education level: Not on file  Occupational History   Not on file  Tobacco Use   Smoking status: Former    Current packs/day: 0.00    Average packs/day: 1 pack/day for 40.0 years (40.0 ttl pk-yrs)    Types: Cigarettes    Start date: 07/21/1980    Quit date: 07/21/2020    Years since quitting: 3.0   Smokeless tobacco: Never  Vaping Use   Vaping status: Never Used  Substance and Sexual Activity   Alcohol use: Not Currently    Comment: none since 12/ 2021   Drug use: Not Currently    Types: Marijuana    Comment: none since 12/ 2021   Sexual activity: Not on file  Other Topics Concern   Not on file  Social History Narrative   Not on file   Social Drivers of Health   Financial Resource  Strain: Not on file  Food Insecurity: No Food Insecurity (03/17/2023)   Hunger Vital Sign    Worried About Running Out of Food in the Last Year: Never true    Ran Out of Food in the Last Year: Never true  Transportation Needs: No Transportation Needs (03/17/2023)   PRAPARE - Administrator, Civil Service (Medical): No    Lack of Transportation (Non-Medical): No  Physical Activity: Not on file  Stress: Not on file  Social Connections: Not on file  Intimate Partner Violence: Patient Unable To Answer (03/17/2023)   Humiliation, Afraid, Rape, and Kick questionnaire    Fear of Current or Ex-Partner: Patient unable to answer    Emotionally Abused: Patient unable to  answer    Physically Abused: Patient unable to answer    Sexually Abused: Patient unable to answer    FAMILY HISTORY: Family History  Problem Relation Age of Onset   Cancer Mother        d. age 51; unknown type   Hypertension Father    Congestive Heart Failure Father    Breast cancer Sister 62   Diabetes type II Sister    Hypertension Sister    Hypertension Brother    Autism spectrum disorder Brother    Ovarian cancer Maternal Aunt        dx before age 25   Prostate cancer Maternal Uncle        dx after 50   Breast cancer Paternal Aunt        dx 65s   Cancer Paternal Aunt        dx after 73; unknown type   Ovarian cancer Paternal Grandmother        dx and d. mid 65s    ALLERGIES:  has no known allergies.  MEDICATIONS:  Current Outpatient Medications  Medication Sig Dispense Refill   acetaminophen (TYLENOL) 500 MG tablet Take 500 mg by mouth every 6 (six) hours as needed.     aspirin 81 MG chewable tablet Chew 1 tablet (81 mg total) by mouth daily.     atorvastatin (LIPITOR) 80 MG tablet Take 1 tablet (80 mg total) by mouth daily. (Patient taking differently: Take 80 mg by mouth at bedtime.) 90 tablet 3   clopidogrel (PLAVIX) 75 MG tablet Take 1 tablet (75 mg total) by mouth daily. 15 tablet 0    enzalutamide (XTANDI) 40 MG tablet Take 4 tablets (160 mg total) by mouth daily. 120 tablet 2   losartan (COZAAR) 25 MG tablet Take 1 tablet (25 mg total) by mouth daily. 90 tablet 3   metoprolol succinate (TOPROL-XL) 25 MG 24 hr tablet Take 0.5 tablets (12.5 mg total) by mouth 2 (two) times daily. 90 tablet 3   polyethylene glycol (MIRALAX / GLYCOLAX) 17 g packet Take 17 g by mouth daily as needed for moderate constipation. 14 each 0   No current facility-administered medications for this visit.    REVIEW OF SYSTEMS:   Constitutional: ( - ) fevers, ( - )  chills , ( - ) night sweats Eyes: ( - ) blurriness of vision, ( - ) double vision, ( - ) watery eyes Ears, nose, mouth, throat, and face: ( - ) mucositis, ( - ) sore throat Respiratory: ( - ) cough, ( - ) dyspnea, ( - ) wheezes Cardiovascular: ( - ) palpitation, ( - ) chest discomfort, ( - ) lower extremity swelling Gastrointestinal:  ( - ) nausea, ( - ) heartburn, ( - ) change in bowel habits Skin: ( - ) abnormal skin rashes Lymphatics: ( - ) new lymphadenopathy, ( - ) easy bruising Neurological: ( - ) numbness, ( - ) tingling, ( - ) new weaknesses Behavioral/Psych: ( - ) mood change, ( - ) new changes  All other systems were reviewed with the patient and are negative.  PHYSICAL EXAMINATION:  Vitals:   07/31/23 1354  BP: (!) 135/95  Pulse: (!) 123  Resp: (!) 123  SpO2: 96%     Filed Weights    GENERAL: Chronically ill-appearing middle-aged African-American male, alert, no distress and comfortable SKIN: skin color, texture, turgor are normal, no rashes or significant lesions EYES: conjunctiva are pink and non-injected, sclera clear LUNGS: clear to  auscultation and percussion with normal breathing effort HEART: regular rate & rhythm and no murmurs and no lower extremity edema Musculoskeletal: no cyanosis of digits and no clubbing  PSYCH: minimally verbal  NEURO: no focal motor/sensory deficits  LABORATORY DATA:  I have  reviewed the data as listed    Latest Ref Rng & Units 07/31/2023    1:31 PM 04/30/2023   11:31 AM 02/07/2023    9:47 AM  CBC  WBC 4.0 - 10.5 K/uL 6.3  3.9  3.4   Hemoglobin 13.0 - 17.0 g/dL 40.9  81.1  91.4   Hematocrit 39.0 - 52.0 % 34.8  31.0  35.2   Platelets 150 - 400 K/uL 196  154  158        Latest Ref Rng & Units 07/31/2023    1:31 PM 04/30/2023   11:31 AM 02/07/2023    9:47 AM  CMP  Glucose 70 - 99 mg/dL 782  956  213   BUN 8 - 23 mg/dL 29  13  13    Creatinine 0.61 - 1.24 mg/dL 0.86  5.78  4.69   Sodium 135 - 145 mmol/L 147  141  141   Potassium 3.5 - 5.1 mmol/L 4.2  4.0  4.0   Chloride 98 - 111 mmol/L 110  108  105   CO2 22 - 32 mmol/L 30  28  30    Calcium 8.9 - 10.3 mg/dL 62.9  9.6  52.8   Total Protein 6.5 - 8.1 g/dL 7.8  6.9  6.7   Total Bilirubin <1.2 mg/dL 0.6  0.6  0.7   Alkaline Phos 38 - 126 U/L 59  56  47   AST 15 - 41 U/L 12  19  13    ALT 0 - 44 U/L 6  17  10     RADIOGRAPHIC STUDIES: No results found.  ASSESSMENT & PLAN Antonio Navarro 62 y.o. male with medical history significant for castrate sensitive prostate cancer who presents for a follow up visit.  # Castrate Sensitive Prostate Cancer -- Currently receives Lupron shots with urology.  Has been receiving Eligard every 6 months  -- PSA dropped to 0.2 at last visit on 04/30/2023.  -- Patient unable to tolerate Zytiga pills due to their size.  Discussed with pharmacy who noted that enzalutamide would be the smallest oral therapy we could try.   -- continue enzalutamide 160 mg p.o. daily. -- Testosterone at 62 on 04/30/2023, patient last received lupron with [facility].  The patient's family will check to see when the medication was last administered. -- Recommend return to clinic in 3 months with likely interval ADT injection.   # Blood In Urine -- Etiology is unclear.  Patient is already established with urology.  Recommend he reach out to urology for further evaluation and management.  The family  notes they would do this. -- Provided family with a UA cup to assess for urinary tract infection.  He is not having any of the signs or symptoms concerning for UTI.  # Weight Loss --refered to Cancer Center dietician  Orders Placed This Encounter  Procedures   Urinalysis, Complete w Microscopic    Standing Status:   Future    Expected Date:   07/31/2023    Expiration Date:   07/30/2024    All questions were answered. The patient knows to call the clinic with any problems, questions or concerns.  A total of more than 30 minutes were spent on this encounter with  face-to-face time and non-face-to-face time, including preparing to see the patient, ordering tests and/or medications, counseling the patient and coordination of care as outlined above.   Ulysees Barns, MD Department of Hematology/Oncology [facility] at [facility] Phone: [phone] Pager: [phone] Email: [email or web address]  07/31/2023 4:45 PM

## 2023-08-01 LAB — TESTOSTERONE: Testosterone: 50 ng/dL — ABNORMAL LOW (ref 264–916)

## 2023-08-01 LAB — PROSTATE-SPECIFIC AG, SERUM (LABCORP): Prostate Specific Ag, Serum: <0.1 ng/mL (ref 0.0–4.0)

## 2023-08-04 ENCOUNTER — Telehealth: Payer: Self-pay | Admitting: Hematology and Oncology

## 2023-08-05 ENCOUNTER — Other Ambulatory Visit: Payer: Self-pay

## 2023-08-07 ENCOUNTER — Encounter: Payer: Medicaid Other | Admitting: Student

## 2023-08-09 ENCOUNTER — Encounter (HOSPITAL_BASED_OUTPATIENT_CLINIC_OR_DEPARTMENT_OTHER): Payer: Self-pay | Admitting: Cardiology

## 2023-08-11 ENCOUNTER — Telehealth: Payer: Self-pay

## 2023-08-11 NOTE — Telephone Encounter (Signed)
Patient is already scheduled for a appointment on 08/26/22 at 1:55 pm with Robin Searing, NP. Appointment notes has been updates to reflect pre-op clearance.

## 2023-08-11 NOTE — Telephone Encounter (Addendum)
   Pre-operative Risk Assessment    Patient Name: Antonio Navarro  DOB: 1960-10-10 MRN: [id number]   LOV: 02/22/23: PRE-OP CLEARANCE WITH EMILY MONGE, NP  NOV: 08/27/23 AT 1:55 AM WITH Robin Searing, NP    Request for Surgical Clearance    Procedure:   CYTOSCOPY   Date of Surgery:  Clearance TBD                                 Surgeon:  DR. MACHEN Surgeon's Group or Practice Name:  [facility] Phone number:  [phone] Fax number:  [phone]   Type of Clearance Requested:   - Pharmacy:  Hold Clopidogrel (Plavix) NEEDS INSTRUCTIONS    Type of Anesthesia:  Not Indicated   Additional requests/questions:    SignedMichaelle Copas   08/11/2023, 1:44 PM

## 2023-08-11 NOTE — Telephone Encounter (Signed)
    Primary Cardiologist:Traci Turner, MD  Chart reviewed as part of pre-operative protocol coverage. Because of Antonio Navarro's past medical history and time since last visit, he/she will require a follow-up visit in order to better assess preoperative cardiovascular risk.  Pre-op covering staff: - Please schedule Office appointment and call patient to inform them. - Please contact requesting surgeon's office via preferred method (i.e, phone, fax) to inform them of need for appointment prior to surgery.  If applicable, this message will also be routed to pharmacy pool and/or primary cardiologist for input on holding anticoagulant/antiplatelet agent as requested below so that this information is available at time of patient's appointment.   Ronney Asters, NP  08/11/2023, 1:58 PM

## 2023-08-20 DIAGNOSIS — Z9289 Personal history of other medical treatment: Secondary | ICD-10-CM

## 2023-08-20 HISTORY — DX: Personal history of other medical treatment: Z92.89

## 2023-08-25 NOTE — Progress Notes (Deleted)
 Cardiology Office Note    Patient Name: Antonio Navarro Date of Encounter: 08/25/2023  Primary Care Provider:  [name], DO Primary Cardiologist:  Antonio Bihari, MD Primary Electrophysiologist: None   Past Medical History    Past Medical History:  Diagnosis Date   Anoxic brain injury (HCC) 07/22/2020   cardiac arrest for 10 minutes prior to resuscitationed by EMS , residual unable to write, gait instability (uses wheelchair) has speech impediment but can communicate   CAD (coronary artery disease), native coronary artery 07/2020   cardiologist--- dr santo;    07-22-2020 s/p cardiac arrest in setting of STEMI with cath showing 40%pRCA and 95% mLCx ,  DES x1 to mLCx   Dysphagia    intermittant   Family history of adverse reaction to anesthesia    sister--  hard to wake   Gait instability    residual from brain injury ,  uses wheelchair   GERD (gastroesophageal reflux disease)    History of kidney stones    History of ST elevation myocardial infarction (STEMI) 07/22/2020   inferolateral ---  VDRF  shocked 6 times by EMS, intubated,  ef 40%,  post PCI Stent to CFx, 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;   pt had prolonged hospitalization   HLD (hyperlipidemia)    Hypertension    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   Ischemic cardiomyopathy 07/2020   07-22-2020  EF 40% at time of MI;   08-06-2021  ef 55-60% per echo   Malignant neoplasm prostate [facility]) 10/2021   primary urologist-- dr borden/  oncologist--- dr shadad/  radiation oncology-- dr patrcia;  dx 03/ 2023,  Gleason 5+4, PSA 10-81   Nocturia    S/P drug eluting coronary stent placement 07/22/2021   DES x 1 to midCFx   Speech problem    pt is able to communicate needs but has a little impediment   Urinary incontinence    uses depends   Uses wheelchair    uses wheelchair due to gait instability,  can stand and pivot with minminal assist    History of Present Illness  Antonio Navarro is a 63 y.o. male with a PMH of CAD s/p inferior lateral STEMI and postcardiac arrest/VF 07/2020 PCI/DES to 5% mid circumflex HTN, HLD ischemic DCM, prostate CA who presents today for preoperative clearance.  Mr. Antonio Navarro was seen initially in 07/2020 he was admitted for cardiac arrest in the setting of inferior lateral STEMI.  He was resuscitated in the field with CPR and intubation and underwent emergent LHC that revealed occluded left circumflex treated with PCI/DES x 1 and no other obstructive disease present.  2D echo was completed showing EF of 40-45% and experienced AKI during admission with improvement after fluid resuscitation.  He also suffered anoxic brain injury with prolonged hospitalization.  He was started on GDMT repeat 2D echo completed 07/2021 showed recovered EF of 55 to 60% with no RWMA and mild LVH.  He was last seen in office on 10/2021 for preoperative clearance for prostate biopsy.  He is currently bedridden and wheelchair-bound due to previous anoxic brain injury.   During today's visit the patient reports*** .  Patient denies chest pain, palpitations, dyspnea, PND, orthopnea, nausea, vomiting, dizziness, syncope, edema, weight gain, or early satiety.  ***Notes: Cystoscopy procedure-patient can hold Plavix  5 days prior to procedure and should restart postprocedure when surgically safe. -Last ischemic evaluation: Possibly need Lexiscan -Last echo: -Interim ED visits: Review of  Systems  Please see the history of present illness.    All other systems reviewed and are otherwise negative except as noted above.  Physical Exam    Wt Readings from Last 3 Encounters:  04/30/23 136 lb 1.6 oz (61.7 kg)  03/17/23 142 lb 9.6 oz (64.7 kg)  09/11/22 164 lb (74.4 kg)   CD:Uyzmz were no vitals filed for this visit.,There is no height or weight on file to calculate BMI. GEN: Well nourished, well developed in no acute distress Neck: No JVD; No carotid bruits Pulmonary: Clear to  auscultation without rales, wheezing or rhonchi  Cardiovascular: Normal rate. Regular rhythm. Normal S1. Normal S2.   Murmurs: There is no murmur.  ABDOMEN: Soft, non-tender, non-distended EXTREMITIES:  No edema; No deformity   EKG/LABS/ Recent Cardiac Studies   ECG personally reviewed by me today - ***  Risk Assessment/Calculations:   {Does this patient have ATRIAL FIBRILLATION?:[id number]}      Lab Results  Component Value Date   WBC 6.3 07/31/2023   HGB 11.9 (L) 07/31/2023   HCT 34.8 (L) 07/31/2023   MCV 96.9 07/31/2023   PLT 196 07/31/2023   Lab Results  Component Value Date   CREATININE 1.28 (H) 07/31/2023   BUN 29 (H) 07/31/2023   NA 147 (H) 07/31/2023   K 4.2 07/31/2023   CL 110 07/31/2023   CO2 30 07/31/2023   Lab Results  Component Value Date   CHOL 92 (L) 06/04/2021   HDL 48 06/04/2021   LDLCALC 32 06/04/2021   TRIG 45 06/04/2021   CHOLHDL 1.9 06/04/2021    Lab Results  Component Value Date   HGBA1C 4.7 (L) 07/29/2020   Assessment & Plan    1.  Preoperative clearance:  2.  Coronary artery disease:  3.  Essential hypertension:  4.  History of prostate CA:      Disposition: Follow-up with Antonio Bihari, MD or APP in *** months {Are you ordering a CV Procedure (e.g. stress test, cath, DCCV, TEE, etc)?   Press F2        :[id number]}   Signed, Wyn Raddle, Jackee Shove, NP 08/25/2023, 10:09 AM [facility]

## 2023-08-27 ENCOUNTER — Other Ambulatory Visit: Payer: Self-pay

## 2023-08-27 ENCOUNTER — Ambulatory Visit: Payer: Medicaid Other | Admitting: Nurse Practitioner

## 2023-08-27 DIAGNOSIS — Z01810 Encounter for preprocedural cardiovascular examination: Secondary | ICD-10-CM

## 2023-08-27 DIAGNOSIS — I2510 Atherosclerotic heart disease of native coronary artery without angina pectoris: Secondary | ICD-10-CM

## 2023-08-27 DIAGNOSIS — I255 Ischemic cardiomyopathy: Secondary | ICD-10-CM

## 2023-08-27 DIAGNOSIS — I10 Essential (primary) hypertension: Secondary | ICD-10-CM

## 2023-08-29 ENCOUNTER — Other Ambulatory Visit: Payer: Self-pay

## 2023-08-29 ENCOUNTER — Other Ambulatory Visit: Payer: Self-pay | Admitting: Hematology and Oncology

## 2023-08-29 DIAGNOSIS — C61 Malignant neoplasm of prostate: Secondary | ICD-10-CM

## 2023-08-29 NOTE — Progress Notes (Signed)
 Specialty Pharmacy Refill Coordination Note  Antonio Navarro is a 63 y.o. male contacted today regarding refills of specialty medication(s) Enzalutamide  (XTANDI )   Patient requested (Patient-Rptd) Delivery   Delivery date: 09/02/23   Verified address: (Patient-Rptd) [address] [place]  [address]   Medication will be filled on 09/01/23.   Pending a refill request, patient was left a voice mail regarding this pending refill and to follow up with the specialty pharmacy if medication does not arrive by the 09/02/23 as refill may still be pending.

## 2023-08-31 MED ORDER — ENZALUTAMIDE 40 MG PO TABS
160.0000 mg | ORAL_TABLET | Freq: Every day | ORAL | 2 refills | Status: DC
  Filled 2023-09-01: qty 120, 30d supply, fill #0
  Filled 2023-09-23: qty 120, 30d supply, fill #1
  Filled 2023-10-17: qty 120, 30d supply, fill #2

## 2023-09-01 ENCOUNTER — Other Ambulatory Visit (HOSPITAL_COMMUNITY): Payer: Self-pay

## 2023-09-22 NOTE — Progress Notes (Addendum)
 Cardiology Office Note    Patient Name: Antonio Navarro Date of Encounter: 09/23/2023  Primary Care Provider:  [name], DO Primary Cardiologist:  Antonio Bihari, MD Primary Electrophysiologist: None   Past Medical History    Past Medical History:  Diagnosis Date   Anoxic brain injury (HCC) 07/22/2020   cardiac arrest for 10 minutes prior to resuscitationed by EMS , residual unable to write, gait instability (uses wheelchair) has speech impediment but can communicate   CAD (coronary artery disease), native coronary artery 07/2020   cardiologist--- dr Antonio Navarro;    07-22-2020 s/p cardiac arrest in setting of STEMI with cath showing 40%pRCA and 95% mLCx ,  DES x1 to mLCx   Dysphagia    intermittant   Family history of adverse reaction to anesthesia    sister--  hard to wake   Gait instability    residual from brain injury ,  uses wheelchair   GERD (gastroesophageal reflux disease)    History of kidney stones    History of ST elevation myocardial infarction (STEMI) 07/22/2020   inferolateral ---  VDRF  shocked 6 times by EMS, intubated,  ef 40%,  post PCI Stent to CFx, 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;   pt had prolonged hospitalization   HLD (hyperlipidemia)    Hypertension    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   Ischemic cardiomyopathy 07/2020   07-22-2020  EF 40% at time of MI;   08-06-2021  ef 55-60% per echo   Malignant neoplasm prostate [facility]) 10/2021   primary urologist-- dr Antonio Navarro/  oncologist--- dr Antonio Navarro/  radiation oncology-- dr Antonio Navarro;  dx 03/ 2023,  Gleason 5+4, PSA 10-81   Nocturia    S/P drug eluting coronary stent placement 07/22/2021   DES x 1 to midCFx   Speech problem    pt is able to communicate needs but has a little impediment   Urinary incontinence    uses depends   Uses wheelchair    uses wheelchair due to gait instability,  can stand and pivot with minminal assist    History of Present Illness  Antonio Navarro is a 63 y.o. male with a PMH of CAD s/p inferior lateral STEMI and postcardiac arrest/VF 07/2020 PCI/DES to 5% mid circumflex HTN, HLD ischemic DCM, prostate CA who presents today for preoperative clearance.  Mr. Antonio Navarro was seen initially in 07/2020 he was admitted for cardiac arrest in the setting of inferior lateral STEMI.  He was resuscitated in the field with CPR and intubation and underwent emergent LHC that revealed occluded left circumflex treated with PCI/DES x 1 and no other obstructive disease present.  2D echo was completed showing EF of 40-45% and experienced AKI during admission with improvement after fluid resuscitation.  He also suffered anoxic brain injury with prolonged hospitalization.  He was started on GDMT repeat 2D echo completed 07/2021 showed recovered EF of 55 to 60% with no RWMA and mild LVH.  He was last seen in office on 10/2021 for preoperative clearance for prostate biopsy.  He is currently bedridden and wheelchair-bound due to previous anoxic brain injury.  Mr. Antonio Navarro presents today for follow-up of his sister and fianc.  He is nonverbal and bedridden with history being provided by his fiance.  She reports that he has been doing well but notes some weight loss over the past 3 months.  He is currently being treated for stage IV prostate cancer.  He is followed by  the cancer center with ongoing treatment.  His blood pressure today was 98/72 and patient is tolerating current medications without any adverse reactions.  During today's visit the patient fianc reports no  chest pain, palpitations, dyspnea, PND, orthopnea, nausea, vomiting, dizziness, syncope, edema, weight gain, or early satiety.  Review of Systems  Please see the history of present illness.    All other systems reviewed and are otherwise negative except as noted above.  Physical Exam    Wt Readings from Last 3 Encounters:  09/23/23 114 lb (51.7 kg)  04/30/23 136 lb 1.6 oz (61.7 kg)  03/17/23 142 lb 9.6  oz (64.7 kg)   CD:Uyzmz were no vitals filed for this visit.,Body mass index is 15.46 kg/m. GEN: Bed ridden and wheelchair-bound in no acute distress Neck: No JVD; No carotid bruits Pulmonary: Clear to auscultation without rales, wheezing or rhonchi  Cardiovascular: Normal rate. Regular rhythm. Normal S1. Normal S2.   Murmurs: There is no murmur.  ABDOMEN: Soft, non-tender, non-distended EXTREMITIES:  No edema; No deformity   EKG/LABS/ Recent Cardiac Studies   ECG personally reviewed by me today -sinus rhythm with occasional PVC and rate of 82 bpm with no acute changes consistent with previous EKG.  Risk Assessment/Calculations:          Lab Results  Component Value Date   WBC 6.3 07/31/2023   HGB 11.9 (L) 07/31/2023   HCT 34.8 (L) 07/31/2023   MCV 96.9 07/31/2023   PLT 196 07/31/2023   Lab Results  Component Value Date   CREATININE 1.28 (H) 07/31/2023   BUN 29 (H) 07/31/2023   NA 147 (H) 07/31/2023   K 4.2 07/31/2023   CL 110 07/31/2023   CO2 30 07/31/2023   Lab Results  Component Value Date   CHOL 92 (L) 06/04/2021   HDL 48 06/04/2021   LDLCALC 32 06/04/2021   TRIG 45 06/04/2021   CHOLHDL 1.9 06/04/2021    Lab Results  Component Value Date   HGBA1C 4.7 (L) 07/29/2020   Assessment & Plan    1.  Preoperative clearance: -Patient's RCRI score is 11% -Patient originally presented for surgical clearance however per his fiance and sister he is no longer in need of procedure and have elected to defer any further ischemic evaluation.  2.  Coronary artery disease: -s/p inferior lateral STEMI and postcardiac arrest/VF 07/2020 PCI/DES to 5% mid circumflex -He is currently in no acute distress and EKG was stable with single PVC noted. -Continue ASA 81 mg, Lipitor  80 mg Plavix  75 mg, Toprol  12.5 mg twice daily  3.  Essential hypertension: -Patient's blood pressure today was controlled at 98/72 -Continue losartan  25 mg and Toprol -XL 12.5 mg twice daily  4.  History  of prostate CA: -Patient is currently followed by oncology and urology    Disposition: Follow-up with Antonio Bihari, MD or APP in 12 months    Signed, Wyn Raddle, Jackee Shove, NP 09/23/2023, 2:13 PM [facility]

## 2023-09-23 ENCOUNTER — Other Ambulatory Visit: Payer: Self-pay

## 2023-09-23 ENCOUNTER — Encounter: Payer: Self-pay | Admitting: Nurse Practitioner

## 2023-09-23 ENCOUNTER — Ambulatory Visit: Payer: Medicaid Other | Attending: Nurse Practitioner | Admitting: Nurse Practitioner

## 2023-09-23 VITALS — BP 98/72 | Ht 72.0 in | Wt 114.0 lb

## 2023-09-23 DIAGNOSIS — I2583 Coronary atherosclerosis due to lipid rich plaque: Secondary | ICD-10-CM | POA: Diagnosis present

## 2023-09-23 DIAGNOSIS — Z01810 Encounter for preprocedural cardiovascular examination: Secondary | ICD-10-CM | POA: Diagnosis not present

## 2023-09-23 DIAGNOSIS — I2510 Atherosclerotic heart disease of native coronary artery without angina pectoris: Secondary | ICD-10-CM | POA: Insufficient documentation

## 2023-09-23 DIAGNOSIS — I10 Essential (primary) hypertension: Secondary | ICD-10-CM | POA: Diagnosis not present

## 2023-09-23 DIAGNOSIS — C61 Malignant neoplasm of prostate: Secondary | ICD-10-CM | POA: Diagnosis present

## 2023-09-23 MED ORDER — LOSARTAN POTASSIUM 25 MG PO TABS: 25.0000 mg | ORAL_TABLET | Freq: Every day | ORAL | 3 refills | Status: DC

## 2023-09-23 MED ORDER — ATORVASTATIN CALCIUM 80 MG PO TABS: 80.0000 mg | ORAL_TABLET | Freq: Every day | ORAL | 3 refills | Status: DC

## 2023-09-23 MED ORDER — CLOPIDOGREL BISULFATE 75 MG PO TABS: 75.0000 mg | ORAL_TABLET | Freq: Every day | ORAL | 3 refills | Status: DC

## 2023-09-23 MED ORDER — METOPROLOL SUCCINATE ER 25 MG PO TB24: 12.5000 mg | ORAL_TABLET | Freq: Two times a day (BID) | ORAL | 3 refills | Status: DC

## 2023-09-23 NOTE — Patient Instructions (Signed)
Medication Instructions:   Your physician recommends that you continue on your current medications as directed. Please refer to the Current Medication list given to you today.   *If you need a refill on your cardiac medications before your next appointment, please call your pharmacy*   Lab Work:  None ordered.  If you have labs (blood work) drawn today and your tests are completely normal, you will receive your results only by: MyChart Message (if you have MyChart) OR A paper copy in the mail If you have any lab test that is abnormal or we need to change your treatment, we will call you to review the results.   Testing/Procedures:  None ordered.   Follow-Up: At [facility], you and your health needs are our priority.  As part of our continuing mission to provide you with exceptional heart care, we have created designated Provider Care Teams.  These Care Teams include your primary Cardiologist (physician) and Advanced Practice Providers (APPs -  Physician Assistants and Nurse Practitioners) who all work together to provide you with the care you need, when you need it.  We recommend signing up for the patient portal called "MyChart".  Sign up information is provided on this After Visit Summary.  MyChart is used to connect with patients for Virtual Visits (Telemedicine).  Patients are able to view lab/test results, encounter notes, upcoming appointments, etc.  Non-urgent messages can be sent to your provider as well.   To learn more about what you can do with MyChart, go to [email or web address].    Your next appointment:   1 year(s)  Provider:   Armanda Magic, MD     Other Instructions Your physician wants you to follow-up in: 1 year.  You will receive a reminder letter in the mail two months in advance. If you don't receive a letter, please call our office to schedule the follow-up appointment.    1st Floor: - Lobby - Registration  - Pharmacy  - Lab -  Cafe  2nd Floor: - PV Lab - Diagnostic Testing (echo, CT, nuclear med)  3rd Floor: - Vacant  4th Floor: - TCTS (cardiothoracic surgery) - AFib Clinic - Structural Heart Clinic - Vascular Surgery  - Vascular Ultrasound  5th Floor: - HeartCare Cardiology (general and EP) - Clinical Pharmacy for coumadin, hypertension, lipid, weight-loss medications, and med management appointments    Valet parking services will be available as well.    Adopting a Healthy Lifestyle.   Weight: Know what a healthy weight is for you (roughly BMI <25) and aim to maintain this. You can calculate your body mass index on your smart phone. Unfortunately, this is not the most accurate measure of healthy weight, but it is the simplest measurement to use. A more accurate measurement involves body scanning which measures lean muscle, fat tissue and bony density. We do not have this equipment at [facility].    Diet: Aim for 7+ servings of fruits and vegetables daily Limit animal fats in diet for cholesterol and heart health - choose grass fed whenever available Avoid highly processed foods (fast food burgers, tacos, fried chicken, pizza, hot dogs, french fries)  Saturated fat comes in the form of butter, lard, coconut oil, margarine, partially hydrogenated oils, and fat in meat. These increase your risk of cardiovascular disease.  Use healthy plant oils, such as olive, canola, soy, corn, sunflower and peanut.  Whole foods such as fruits, vegetables and whole grains have fiber  Men need > 38 grams of fiber  per day Women need > 25 grams of fiber per day  Load up on vegetables and fruits - one-half of your plate: Aim for color and variety, and remember that potatoes dont count. Go for whole grains - one-quarter of your plate: Whole wheat, barley, wheat berries, quinoa, oats, brown rice, and foods made with them. If you want pasta, go with whole wheat pasta. Protein power - one-quarter of your plate: Fish, chicken,  beans, and nuts are all healthy, versatile protein sources. Limit red meat. You need carbohydrates for energy! The type of carbohydrate is more important than the amount. Choose carbohydrates such as vegetables, fruits, whole grains, beans, and nuts in the place of white rice, white pasta, potatoes (baked or fried), macaroni and cheese, cakes, cookies, and donuts.  If youre thirsty, drink water. Coffee and tea are good in moderation, but skip sugary drinks and limit milk and dairy products to one or two daily servings. Keep sugar intake at 6 teaspoons or 24 grams or LESS       Exercise: Aim for 150 min of moderate intensity exercise weekly for heart health, and weights twice weekly for bone health Stay active - any steps are better than no steps! Aim for 7-9 hours of sleep daily

## 2023-09-23 NOTE — Progress Notes (Signed)
 Specialty Pharmacy Refill Coordination Note  Antonio Navarro is a 63 y.o. male contacted today regarding refills of specialty medication(s) Enzalutamide  (XTANDI )   Patient requested Delivery   Delivery date: 09/30/23   Verified address: [address]   [place] [address]   Medication will be filled on 09/29/23.

## 2023-09-24 ENCOUNTER — Telehealth: Payer: Self-pay | Admitting: Cardiology

## 2023-09-24 NOTE — Telephone Encounter (Signed)
   Patient Name: Antonio Navarro  DOB: 11-18-1960 MRN: [id number]  Primary Cardiologist: Wilbert Bihari, MD  Chart reviewed as part of pre-operative protocol coverage.   Per patient and family they no longer wish to proceed with procedure at this time.  I have forwarded a copy of the visit note for requesting provider's record.  We will remove request from the clearance inbox at this time.  Wyn Raddle, Jackee Shove, NP 09/24/2023, 9:46 AM

## 2023-09-24 NOTE — Telephone Encounter (Signed)
 I will forward to pre op APP to review if pt has been cleared.

## 2023-09-24 NOTE — Telephone Encounter (Signed)
 They are calling back to see if pre-op has been done since pt came in yesterday for ov.   Fax # [phone]

## 2023-09-29 ENCOUNTER — Other Ambulatory Visit: Payer: Self-pay

## 2023-10-03 ENCOUNTER — Ambulatory Visit: Payer: Medicaid Other | Admitting: Student

## 2023-10-03 DIAGNOSIS — R32 Unspecified urinary incontinence: Secondary | ICD-10-CM | POA: Diagnosis not present

## 2023-10-03 DIAGNOSIS — G931 Anoxic brain damage, not elsewhere classified: Secondary | ICD-10-CM

## 2023-10-03 NOTE — Progress Notes (Signed)
Internal Medicine Clinic Attending  Case discussed with the resident at the time of the visit.  We reviewed the resident's history and exam and pertinent patient test results.  I agree with the assessment, diagnosis, and plan of care documented in the resident's note.

## 2023-10-03 NOTE — Assessment & Plan Note (Signed)
Telehealth visit today with the patient's legal guardian who is his spouse [name].  Name and date of birth of the patient were verified and the patient was present with his spouse during the call however he is nonverbal so she provided all of the information.  Today's visit is to renew incontinence supplies due to chronic urinary incontinence following anoxic brain injury due to STEMI in 2021.  Patient is wheelchair/bedbound and requires full assistance with all ADLs and IADLs including transfers.  The patient's sister and his spouse are primary caretakers.  The patient uses adult diapers (briefs with wings) adult pull-ups, gloves, pads, and wipes regularly for managing urinary incontinence.  The supplies are still needed for his care and we will write a referral to renew them. - Ambulatory referral for DME incontinence supplies through [employer] - Follow-up in 3 to 4 months for routine office visit

## 2023-10-03 NOTE — Addendum Note (Signed)
Addended by: Rocky Morel on: 10/03/2023 11:34 AM   Modules accepted: Orders

## 2023-10-03 NOTE — Progress Notes (Signed)
[facility] Internal Medicine Residency Telephone Encounter Continuity Care Appointment  HPI:  This telephone encounter was created for Mr. Antonio Navarro on 10/03/2023 for the following purpose/cc to discuss incontinence supplies.   Past Medical History:  Past Medical History:  Diagnosis Date   Anoxic brain injury (HCC) 07/22/2020   cardiac arrest for 10 minutes prior to resuscitationed by EMS , residual unable to write, gait instability (uses wheelchair) has speech impediment but can communicate   CAD (coronary artery disease), native coronary artery 07/2020   cardiologist--- dr Izora Ribas;    07-22-2020 s/p cardiac arrest in setting of STEMI with cath showing 40%pRCA and 95% mLCx ,  DES x1 to mLCx   Dysphagia    intermittant   Family history of adverse reaction to anesthesia    sister--  hard to wake   Gait instability    residual from brain injury ,  uses wheelchair   GERD (gastroesophageal reflux disease)    History of kidney stones    History of ST elevation myocardial infarction (STEMI) 07/22/2020   inferolateral ---  VDRF  shocked 6 times by EMS, intubated,  ef 40%,  post PCI Stent to CFx, 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;   pt had prolonged hospitalization   HLD (hyperlipidemia)    Hypertension    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   Ischemic cardiomyopathy 07/2020   07-22-2020  EF 40% at time of MI;   08-06-2021  ef 55-60% per echo   Malignant neoplasm prostate [facility]) 10/2021   primary urologist-- dr borden/  oncologist--- dr shadad/  radiation oncology-- dr Kathrynn Running;  dx 03/ 2023,  Gleason 5+4, PSA 10-81   Nocturia    S/P drug eluting coronary stent placement 07/22/2021   DES x 1 to midCFx   Speech problem    pt is able to communicate needs but has a little impediment   Urinary incontinence    uses depends   Uses wheelchair    uses wheelchair due to gait instability,  can stand and pivot with minminal assist     ROS:  Please  see below in assessment and plan.   Assessment / Plan / Recommendations:  Anoxic brain damage [facility]) Telehealth visit today with the patient's legal guardian who is his spouse Antonio Navarro.  Name and date of birth of the patient were verified and the patient was present with his spouse during the call however he is nonverbal so she provided all of the information.  Today's visit is to renew incontinence supplies due to chronic urinary incontinence following anoxic brain injury due to STEMI in 2021.  Patient is wheelchair/bedbound and requires full assistance with all ADLs and IADLs including transfers.  The patient's sister and his spouse are primary caretakers.  The patient uses adult diapers (briefs with wings) adult pull-ups, gloves, pads, and wipes regularly for managing urinary incontinence.  The supplies are still needed for his care and we will write a referral to renew them. - Ambulatory referral for DME incontinence supplies through [employer] - Follow-up in 3 to 4 months for routine office visit    As always, pt is advised that if symptoms worsen or new symptoms arise, they should go to an urgent care facility or to to ER for further evaluation.   Consent and Medical Decision Making:  Patient discussed with Dr. Antony Contras This is a telephone encounter between Antonio Navarro and Antonio Navarro on 10/03/2023 for renewal of incontinence  supplies due to chronic urinary incontinence secondary to anoxic brain injury. The visit was conducted with the patient located at home and Antonio Navarro at [facility]. The patient's identity was confirmed using their DOB and current address. The his/her legal guardian has consented to being evaluated through a telephone encounter and understands the associated risks (an examination cannot be done and the patient may need to come in for an appointment) / benefits (allows the patient to remain at home, decreasing exposure to coronavirus). I personally spent 15 minutes on  medical discussion.

## 2023-10-08 ENCOUNTER — Telehealth: Payer: Self-pay

## 2023-10-08 NOTE — Telephone Encounter (Signed)
 Patient sister Merita Norton called regarding about incontinence supplies for the patient she stated [employer] has been trying to get in contact with [place] with no response back. [name] is requesting a call back asap.

## 2023-10-17 ENCOUNTER — Other Ambulatory Visit: Payer: Self-pay

## 2023-10-17 ENCOUNTER — Other Ambulatory Visit: Payer: Self-pay | Admitting: Pharmacy Technician

## 2023-10-17 NOTE — Progress Notes (Signed)
 Specialty Pharmacy Refill Coordination Note  Antonio Navarro is a 63 y.o. male contacted today regarding refills of specialty medication(s) Enzalutamide [name]  Spoke with Wife  Patient requested Delivery   Delivery date: 10/24/23   Verified address: [address] [place]   Medication will be filled on 10/23/23.

## 2023-10-23 ENCOUNTER — Other Ambulatory Visit: Payer: Self-pay

## 2023-11-06 ENCOUNTER — Inpatient Hospital Stay: Payer: Medicaid Other | Attending: Hematology

## 2023-11-06 ENCOUNTER — Other Ambulatory Visit: Payer: Self-pay | Admitting: Hematology and Oncology

## 2023-11-06 ENCOUNTER — Inpatient Hospital Stay: Payer: Medicaid Other | Admitting: Hematology and Oncology

## 2023-11-06 DIAGNOSIS — C61 Malignant neoplasm of prostate: Secondary | ICD-10-CM

## 2023-11-06 NOTE — Progress Notes (Signed)
 No show

## 2023-11-11 ENCOUNTER — Other Ambulatory Visit (HOSPITAL_COMMUNITY): Payer: Self-pay

## 2023-11-11 ENCOUNTER — Other Ambulatory Visit: Payer: Self-pay

## 2023-11-12 ENCOUNTER — Other Ambulatory Visit: Payer: Self-pay | Admitting: Hematology and Oncology

## 2023-11-12 ENCOUNTER — Other Ambulatory Visit: Payer: Self-pay

## 2023-11-12 DIAGNOSIS — C61 Malignant neoplasm of prostate: Secondary | ICD-10-CM

## 2023-11-12 NOTE — Progress Notes (Signed)
 Specialty Pharmacy Refill Coordination Note  Antonio Navarro is a 63 y.o. male contacted today regarding refills of specialty medication(s) Enzalutamide [name]   Patient requested (Patient-Rptd) Delivery   Delivery date: 11/18/23   Verified address: (Patient-Rptd) [address] [place] [address]   Medication will be filled on 11/17/23.   This fill date is pending response to refill request from provider. Patient was made aware via a voice mail if they have not received fill by intended date they must follow up with pharmacy.

## 2023-11-19 ENCOUNTER — Other Ambulatory Visit: Payer: Self-pay

## 2023-11-19 NOTE — Progress Notes (Signed)
 Refills denied - patient needs appointment. Spoke with patient and he will call for appointment.

## 2023-11-20 ENCOUNTER — Telehealth: Payer: Self-pay | Admitting: Hematology and Oncology

## 2023-11-24 ENCOUNTER — Telehealth: Payer: Self-pay | Admitting: Hematology and Oncology

## 2023-11-25 ENCOUNTER — Ambulatory Visit: Admitting: Physician Assistant

## 2023-11-25 ENCOUNTER — Other Ambulatory Visit

## 2023-11-28 ENCOUNTER — Inpatient Hospital Stay: Attending: Hematology

## 2023-11-28 ENCOUNTER — Other Ambulatory Visit: Payer: Self-pay

## 2023-11-28 ENCOUNTER — Other Ambulatory Visit: Payer: Self-pay | Admitting: Hematology and Oncology

## 2023-11-28 ENCOUNTER — Inpatient Hospital Stay (HOSPITAL_BASED_OUTPATIENT_CLINIC_OR_DEPARTMENT_OTHER): Admitting: Hematology and Oncology

## 2023-11-28 VITALS — BP 131/90 | HR 90 | Temp 97.9°F | Resp 18 | Wt 136.5 lb

## 2023-11-28 DIAGNOSIS — Z8041 Family history of malignant neoplasm of ovary: Secondary | ICD-10-CM | POA: Diagnosis not present

## 2023-11-28 DIAGNOSIS — Z79899 Other long term (current) drug therapy: Secondary | ICD-10-CM | POA: Diagnosis not present

## 2023-11-28 DIAGNOSIS — R634 Abnormal weight loss: Secondary | ICD-10-CM | POA: Diagnosis not present

## 2023-11-28 DIAGNOSIS — C61 Malignant neoplasm of prostate: Secondary | ICD-10-CM

## 2023-11-28 DIAGNOSIS — R319 Hematuria, unspecified: Secondary | ICD-10-CM | POA: Insufficient documentation

## 2023-11-28 DIAGNOSIS — R232 Flushing: Secondary | ICD-10-CM | POA: Insufficient documentation

## 2023-11-28 DIAGNOSIS — Z87891 Personal history of nicotine dependence: Secondary | ICD-10-CM | POA: Diagnosis not present

## 2023-11-28 DIAGNOSIS — Z803 Family history of malignant neoplasm of breast: Secondary | ICD-10-CM | POA: Diagnosis not present

## 2023-11-28 DIAGNOSIS — Z1379 Encounter for other screening for genetic and chromosomal anomalies: Secondary | ICD-10-CM

## 2023-11-28 DIAGNOSIS — Z809 Family history of malignant neoplasm, unspecified: Secondary | ICD-10-CM | POA: Insufficient documentation

## 2023-11-28 LAB — CMP (CANCER CENTER ONLY)
ALT: 7 U/L (ref 0–44)
AST: 11 U/L — ABNORMAL LOW (ref 15–41)
Albumin: 4 g/dL (ref 3.5–5.0)
Alkaline Phosphatase: 57 U/L (ref 38–126)
Anion gap: 5 (ref 5–15)
BUN: 23 mg/dL (ref 8–23)
CO2: 29 mmol/L (ref 22–32)
Calcium: 9.7 mg/dL (ref 8.9–10.3)
Chloride: 111 mmol/L (ref 98–111)
Creatinine: 1.15 mg/dL (ref 0.61–1.24)
GFR, Estimated: >60 mL/min (ref >60)
Glucose, Bld: 118 mg/dL — ABNORMAL HIGH (ref 70–99)
Potassium: 4 mmol/L (ref 3.5–5.1)
Sodium: 145 mmol/L (ref 135–145)
Total Bilirubin: 0.4 mg/dL (ref 0.0–1.2)
Total Protein: 7.5 g/dL (ref 6.5–8.1)

## 2023-11-28 LAB — CBC WITH DIFFERENTIAL (CANCER CENTER ONLY)
Abs Immature Granulocytes: 0.01 10*3/uL (ref 0.00–0.07)
Basophils Absolute: 0.1 10*3/uL (ref 0.0–0.1)
Basophils Relative: 2 %
Eosinophils Absolute: 0.1 10*3/uL (ref 0.0–0.5)
Eosinophils Relative: 2 %
HCT: 33.9 % — ABNORMAL LOW (ref 39.0–52.0)
Hemoglobin: 11.4 g/dL — ABNORMAL LOW (ref 13.0–17.0)
Immature Granulocytes: 0 %
Lymphocytes Relative: 23 %
Lymphs Abs: 0.8 10*3/uL (ref 0.7–4.0)
MCH: 31.4 pg (ref 26.0–34.0)
MCHC: 33.6 g/dL (ref 30.0–36.0)
MCV: 93.4 fL (ref 80.0–100.0)
Monocytes Absolute: 0.3 10*3/uL (ref 0.1–1.0)
Monocytes Relative: 9 %
Neutro Abs: 2.3 10*3/uL (ref 1.7–7.7)
Neutrophils Relative %: 64 %
Platelet Count: 163 10*3/uL (ref 150–400)
RBC: 3.63 MIL/uL — ABNORMAL LOW (ref 4.22–5.81)
RDW: 13 % (ref 11.5–15.5)
WBC Count: 3.7 10*3/uL — ABNORMAL LOW (ref 4.0–10.5)
nRBC: 0 % (ref 0.0–0.2)

## 2023-11-28 MED ORDER — ENZALUTAMIDE 40 MG PO TABS
160.0000 mg | ORAL_TABLET | Freq: Every day | ORAL | 2 refills | Status: DC
  Filled 2023-11-28: qty 120, 30d supply, fill #0
  Filled 2023-12-18: qty 120, 30d supply, fill #1
  Filled 2024-01-22: qty 120, 30d supply, fill #2

## 2023-11-28 NOTE — Progress Notes (Signed)
 Spoke with finace [name] about new shipping date. Rx just came in. Ship 4/14 for 4/15.

## 2023-11-28 NOTE — Progress Notes (Signed)
 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 Noted to have clinical stage T3bN1 with regional lymph node involvement  12/25/2021: Eligard 45 mg started 01/05/2022: Zytiga 1000 mg with prednisone 5 mg daily started  02/14/2022: last visit with Dr. Dirk Fredericks. Transitioned to comfort based care after completing 6 (out of 30) radiation treatments  03/17/2023: establish care with Dr. Rosaline Coma   Interval History:  Antonio Navarro 63 y.o. male with medical history significant for castrate sensitive prostate cancer who presents for a follow up visit. The patient's last visit was on 11/06/2023. In the interim since the last visit he has started enzalutamide therapy.  On exam today Antonio Navarro is accompanied by his family.  They report he has had no major changes in his health in the interim since our last visit.  He reports he is taking his Xtandi with no major side effects other than some fatigue.  He does have some hot flashes and sweats as well but no nausea, vomiting, or diarrhea.  He is had no recent viral illnesses such as runny nose, sore throat, cough or flu/norovirus.  He reports his appetite has been good.  He notes the medication currently cost $0 per month.  He does not appear to be having pain anywhere..  Overall they are willing and able to continue on his enzalutamide therapy at this time.  He denies any fevers, chills, sweats.  Full 10 point ROS is otherwise negative.  MEDICAL HISTORY:  Past Medical History:  Diagnosis Date   Anoxic brain injury (HCC) 07/22/2020   cardiac arrest for 10 minutes prior to resuscitationed by EMS , residual unable to write, gait instability (uses wheelchair)  has speech impediment but can communicate   CAD (coronary artery disease), native coronary artery 07/2020   cardiologist--- dr Paulita Boss;    07-22-2020 s/p cardiac arrest in setting of STEMI with cath showing 40%pRCA and 95% mLCx ,  DES x1 to mLCx   Dysphagia    intermittant   Family history of adverse reaction to anesthesia    sister--  hard to wake   Gait instability    residual from brain injury ,  uses wheelchair   GERD (gastroesophageal reflux disease)    History of kidney stones    History of ST elevation myocardial infarction (STEMI) 07/22/2020   inferolateral ---  VDRF  shocked 6 times by EMS, intubated,  ef 40%,  post PCI Stent to CFx, 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;   pt had prolonged hospitalization   HLD (hyperlipidemia)    Hypertension    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   Ischemic cardiomyopathy 07/2020   07-22-2020  EF 40% at time of MI;   08-06-2021  ef 55-60% per echo   Malignant neoplasm prostate [facility]) 10/2021   primary urologist-- dr borden/  oncologist--- dr shadad/  radiation oncology-- dr Lorri Rota;  dx 03/ 2023,  Gleason 5+4, PSA 10-81   Nocturia    S/P drug eluting coronary stent placement 07/22/2021   DES x 1 to midCFx   Speech problem    pt is able to communicate needs but has a little impediment   Urinary incontinence    uses  depends   Uses wheelchair    uses wheelchair due to gait instability,  can stand and pivot with minminal assist    SURGICAL HISTORY: Past Surgical History:  Procedure Laterality Date   CORONARY/GRAFT ACUTE MI REVASCULARIZATION N/A 07/22/2020   Procedure: Coronary/Graft Acute MI Revascularization;  Surgeon: Avanell Leigh, MD;  Location: [facility] INVASIVE CV LAB;  Service: Cardiovascular;  Laterality: N/A;   GOLD SEED IMPLANT N/A 02/26/2022   Procedure: GOLD SEED IMPLANT;  Surgeon: Adelbert Homans, MD;  Location: [facility];  Service: Urology;  Laterality: N/A;  30 MINS  FOR THIS CASE   IR GASTROSTOMY TUBE MOD SED  08/17/2020   IR GASTROSTOMY TUBE REMOVAL  11/08/2020   LEFT HEART CATH AND CORONARY ANGIOGRAPHY N/A 07/22/2020   Procedure: LEFT HEART CATH AND CORONARY ANGIOGRAPHY;  Surgeon: Avanell Leigh, MD;  Location: [facility] INVASIVE CV LAB;  Service: Cardiovascular;  Laterality: N/A;   PROSTATE BIOPSY N/A 11/13/2021   Procedure: BIOPSY TRANSRECTAL ULTRASONIC PROSTATE (TUBP);  Surgeon: Mallie Seal, MD;  Location: [facility] ORS;  Service: Urology;  Laterality: N/A;  45 MINS FOR THIS CASE   RADIOLOGY WITH ANESTHESIA N/A 10/16/2021   Procedure: MRI WITH ANESTHESIA OF PROSTATE WITH AND WITHOUT CONTRAST;  Surgeon: Radiologist, Medication, MD;  Location: [facility] OR;  Service: Radiology;  Laterality: N/A;   SPACE OAR INSTILLATION N/A 02/26/2022   Procedure: SPACE OAR INSTILLATION;  Surgeon: Adelbert Homans, MD;  Location: [facility];  Service: Urology;  Laterality: N/A;    SOCIAL HISTORY: Social History   Socioeconomic History   Marital status: Single    Spouse name: Not on file   Number of children: Not on file   Years of education: Not on file   Highest education level: Not on file  Occupational History   Not on file  Tobacco Use   Smoking status: Former    Current packs/day: 0.00    Average packs/day: 1 pack/day for 40.0 years (40.0 ttl pk-yrs)    Types: Cigarettes    Start date: 07/21/1980    Quit date: 07/21/2020    Years since quitting: 3.3   Smokeless tobacco: Never  Vaping Use   Vaping status: Never Used  Substance and Sexual Activity   Alcohol use: Not Currently    Comment: none since 12/ 2021   Drug use: Not Currently    Types: Marijuana    Comment: none since 12/ 2021   Sexual activity: Not on file  Other Topics Concern   Not on file  Social History Narrative   Not on file   Social Drivers of Health   Financial Resource Strain: Not on file  Food Insecurity: No Food Insecurity (03/17/2023)   Hunger Vital Sign     Worried About Running Out of Food in the Last Year: Never true    Ran Out of Food in the Last Year: Never true  Transportation Needs: No Transportation Needs (03/17/2023)   PRAPARE - Administrator, Civil Service (Medical): No    Lack of Transportation (Non-Medical): No  Physical Activity: Not on file  Stress: Not on file  Social Connections: Not on file  Intimate Partner Violence: Patient Unable To Answer (03/17/2023)   Humiliation, Afraid, Rape, and Kick questionnaire    Fear of Current or Ex-Partner: Patient unable to answer    Emotionally Abused: Patient unable to answer    Physically Abused: Patient unable to answer    Sexually Abused: Patient unable to answer  FAMILY HISTORY: Family History  Problem Relation Age of Onset   Cancer Mother        d. age 29; unknown type   Hypertension Father    Congestive Heart Failure Father    Breast cancer Sister 35   Diabetes type II Sister    Hypertension Sister    Hypertension Brother    Autism spectrum disorder Brother    Ovarian cancer Maternal Aunt        dx before age 57   Prostate cancer Maternal Uncle        dx after 14   Breast cancer Paternal Aunt        dx 50s   Cancer Paternal Aunt        dx after 39; unknown type   Ovarian cancer Paternal Grandmother        dx and d. mid 70s    ALLERGIES:  has no known allergies.  MEDICATIONS:  Current Outpatient Medications  Medication Sig Dispense Refill   acetaminophen (TYLENOL) 500 MG tablet Take 500 mg by mouth every 6 (six) hours as needed.     aspirin 81 MG chewable tablet Chew 1 tablet (81 mg total) by mouth daily.     atorvastatin (LIPITOR) 80 MG tablet Take 1 tablet (80 mg total) by mouth daily. 90 tablet 3   clopidogrel (PLAVIX) 75 MG tablet Take 1 tablet (75 mg total) by mouth daily. 90 tablet 3   enzalutamide (XTANDI) 40 MG tablet Take 4 tablets (160 mg total) by mouth daily. 120 tablet 2   losartan (COZAAR) 25 MG tablet Take 1 tablet (25 mg total) by  mouth daily. 90 tablet 3   metoprolol succinate (TOPROL-XL) 25 MG 24 hr tablet Take 0.5 tablets (12.5 mg total) by mouth 2 (two) times daily. 90 tablet 3   polyethylene glycol (MIRALAX / GLYCOLAX) 17 g packet Take 17 g by mouth daily as needed for moderate constipation. 14 each 0   No current facility-administered medications for this visit.    REVIEW OF SYSTEMS:   Constitutional: ( - ) fevers, ( - )  chills , ( - ) night sweats Eyes: ( - ) blurriness of vision, ( - ) double vision, ( - ) watery eyes Ears, nose, mouth, throat, and face: ( - ) mucositis, ( - ) sore throat Respiratory: ( - ) cough, ( - ) dyspnea, ( - ) wheezes Cardiovascular: ( - ) palpitation, ( - ) chest discomfort, ( - ) lower extremity swelling Gastrointestinal:  ( - ) nausea, ( - ) heartburn, ( - ) change in bowel habits Skin: ( - ) abnormal skin rashes Lymphatics: ( - ) new lymphadenopathy, ( - ) easy bruising Neurological: ( - ) numbness, ( - ) tingling, ( - ) new weaknesses Behavioral/Psych: ( - ) mood change, ( - ) new changes  All other systems were reviewed with the patient and are negative.  PHYSICAL EXAMINATION:  There were no vitals filed for this visit.    There were no vitals filed for this visit.   GENERAL: Chronically ill-appearing middle-aged African-American male, alert, no distress and comfortable SKIN: skin color, texture, turgor are normal, no rashes or significant lesions EYES: conjunctiva are pink and non-injected, sclera clear LUNGS: clear to auscultation and percussion with normal breathing effort HEART: regular rate & rhythm and no murmurs and no lower extremity edema Musculoskeletal: no cyanosis of digits and no clubbing  PSYCH: minimally verbal  NEURO: no focal motor/sensory deficits  LABORATORY  DATA:  I have reviewed the data as listed    Latest Ref Rng & Units 07/31/2023    1:31 PM 04/30/2023   11:31 AM 02/07/2023    9:47 AM  CBC  WBC 4.0 - 10.5 K/uL 6.3  3.9  3.4    Hemoglobin 13.0 - 17.0 g/dL 78.2  95.6  21.3   Hematocrit 39.0 - 52.0 % 34.8  31.0  35.2   Platelets 150 - 400 K/uL 196  154  158        Latest Ref Rng & Units 07/31/2023    1:31 PM 04/30/2023   11:31 AM 02/07/2023    9:47 AM  CMP  Glucose 70 - 99 mg/dL 086  578  469   BUN 8 - 23 mg/dL 29  13  13    Creatinine 0.61 - 1.24 mg/dL 6.29  5.28  4.13   Sodium 135 - 145 mmol/L 147  141  141   Potassium 3.5 - 5.1 mmol/L 4.2  4.0  4.0   Chloride 98 - 111 mmol/L 110  108  105   CO2 22 - 32 mmol/L 30  28  30    Calcium 8.9 - 10.3 mg/dL 24.4  9.6  01.0   Total Protein 6.5 - 8.1 g/dL 7.8  6.9  6.7   Total Bilirubin <1.2 mg/dL 0.6  0.6  0.7   Alkaline Phos 38 - 126 U/L 59  56  47   AST 15 - 41 U/L 12  19  13    ALT 0 - 44 U/L 6  17  10     RADIOGRAPHIC STUDIES: No results found.  ASSESSMENT & PLAN Antonio Navarro 63 y.o. male with medical history significant for castrate sensitive prostate cancer who presents for a follow up visit.  # Castrate Sensitive Prostate Cancer -- Currently receives Lupron shots with urology.  Has been receiving Eligard every 6 months  -- PSA dropped to 0.2 at last visit on 04/30/2023.  -- Patient unable to tolerate Zytiga pills due to their size.  Discussed with pharmacy who noted that enzalutamide would be the smallest oral therapy we could try.   -- continue enzalutamide 160 mg p.o. daily. -- Testosterone at 62 on 04/30/2023, patient last received lupron with [facility].  The patient's family will check to see when the medication was last administered. --labs today show white blood cell 3.7, hemoglobin 11.4, MCV 93.4, platelets 163.  Creatinine and LFTs within normal limits.  PSA was undetectable but testosterone rose to 396.  Will need to determine when he last received his Lupron shot in order to restart them here. -- Recommend return to clinic in 3 months with likely interval ADT injection.   # Blood In Urine -- Etiology is unclear.  Patient is already  established with urology.  Recommend he reach out to urology for further evaluation and management.  The family notes they would do this. -- Provided family with a UA cup to assess for urinary tract infection.  He is not having any of the signs or symptoms concerning for UTI.  # Weight Loss --refered to Cancer Center dietician  No orders of the defined types were placed in this encounter.   All questions were answered. The patient knows to call the clinic with any problems, questions or concerns.  A total of more than 30 minutes were spent on this encounter with face-to-face time and non-face-to-face time, including preparing to see the patient, ordering tests and/or medications, counseling the patient and  coordination of care as outlined above.   Rogerio Clay, MD Department of Hematology/Oncology [facility] Phone: [phone] Pager: [phone] Email: [email or web address]  11/28/2023 7:54 AM

## 2023-11-29 LAB — TESTOSTERONE: Testosterone: 396 ng/dL (ref 264–916)

## 2023-11-29 LAB — PROSTATE-SPECIFIC AG, SERUM (LABCORP): Prostate Specific Ag, Serum: <0.1 ng/mL (ref 0.0–4.0)

## 2023-11-30 ENCOUNTER — Encounter: Payer: Self-pay | Admitting: Hematology and Oncology

## 2023-12-02 ENCOUNTER — Telehealth: Payer: Self-pay | Admitting: *Deleted

## 2023-12-02 NOTE — Telephone Encounter (Signed)
 His sister Nerissa Bannister called - she said he has blood in urine - dark, no clots, no pain - like last time and wondered if antibx should be re-ordered. This is new symptom since Friday's appt here with Dr. Rosaline Coma. Dr. Rosaline Coma informed. Per Dr. Rosaline Coma - recommend patient see provider at [facility] since this occurrence of symptoms follows recent antibiotic. Contacted Adrienne with Dr. Les Rao response. She verbalized understanding and states he saw provider at [facility] in February, so they will call for follow up.

## 2023-12-16 ENCOUNTER — Encounter: Payer: Self-pay | Admitting: Hematology and Oncology

## 2023-12-16 NOTE — Telephone Encounter (Signed)
 Opened in error

## 2023-12-18 ENCOUNTER — Other Ambulatory Visit (HOSPITAL_COMMUNITY): Payer: Self-pay

## 2023-12-18 NOTE — Progress Notes (Signed)
 Specialty Pharmacy Ongoing Clinical Assessment Note  Antonio Navarro is a 63 y.o. male who is being followed by the specialty pharmacy service for RxSp Oncology   Patient's specialty medication(s) reviewed today: Enzalutamide  (XTANDI )   Missed doses in the last 4 weeks: 0   Patient/Caregiver did not have any additional questions or concerns.   Therapeutic benefit summary: Patient is achieving benefit   Adverse events/side effects summary: No adverse events/side effects   Patient's therapy is appropriate to: Continue    Goals Addressed             This Visit's Progress    Slow Disease Progression   On track    Patient is on track. Patient will maintain adherence and adhere to provider and/or lab appointments.  PSA remains <0.1 ng/mL as of 11/28/23.         Follow up:  6 months  Malachi Screws Specialty Pharmacist

## 2023-12-18 NOTE — Progress Notes (Signed)
 Specialty Pharmacy Refill Coordination Note  Antonio Navarro is a 63 y.o. male contacted today regarding refills of specialty medication(s) Enzalutamide  (XTANDI )   Patient requested Delivery   Delivery date: 12/30/23   Verified address: [address] [place] [address]   Medication will be filled on 12/29/23.

## 2024-01-22 ENCOUNTER — Other Ambulatory Visit: Payer: Self-pay

## 2024-01-22 NOTE — Progress Notes (Signed)
 Specialty Pharmacy Refill Coordination Note  Antonio Navarro is a 63 y.o. male contacted today regarding refills of specialty medication(s) Enzalutamide  (XTANDI )   Patient requested (Patient-Rptd) Delivery   Delivery date: (Patient-Rptd) 01/27/24   Verified address: (Patient-Rptd) [address] [place]  [address]   Medication will be filled on 01/26/24.

## 2024-01-26 ENCOUNTER — Other Ambulatory Visit: Payer: Self-pay

## 2024-02-13 ENCOUNTER — Other Ambulatory Visit (HOSPITAL_COMMUNITY): Payer: Self-pay

## 2024-02-13 ENCOUNTER — Other Ambulatory Visit: Payer: Self-pay

## 2024-02-13 ENCOUNTER — Encounter (INDEPENDENT_AMBULATORY_CARE_PROVIDER_SITE_OTHER): Payer: Self-pay

## 2024-02-13 ENCOUNTER — Other Ambulatory Visit: Payer: Self-pay | Admitting: Hematology and Oncology

## 2024-02-13 DIAGNOSIS — C61 Malignant neoplasm of prostate: Secondary | ICD-10-CM

## 2024-02-13 NOTE — Progress Notes (Signed)
 Specialty Pharmacy Refill Coordination Note  JOVONI BORKENHAGEN is a 63 y.o. male contacted today regarding refills of specialty medication(s) Enzalutamide  (XTANDI )   Patient requested (Patient-Rptd) Delivery   Delivery date: 02/18/24   Verified address: (Patient-Rptd) [address] [place] [address]   Medication will be filled on 06.30.25 or 07.01.25.   Refill request pending

## 2024-02-16 ENCOUNTER — Other Ambulatory Visit: Payer: Self-pay

## 2024-02-16 MED ORDER — ENZALUTAMIDE 40 MG PO TABS
160.0000 mg | ORAL_TABLET | Freq: Every day | ORAL | 2 refills | Status: DC
  Filled 2024-02-16: qty 120, 30d supply, fill #0
  Filled 2024-03-17: qty 120, 30d supply, fill #1

## 2024-02-17 ENCOUNTER — Other Ambulatory Visit: Payer: Self-pay

## 2024-03-03 ENCOUNTER — Inpatient Hospital Stay (HOSPITAL_BASED_OUTPATIENT_CLINIC_OR_DEPARTMENT_OTHER): Admitting: Hematology and Oncology

## 2024-03-03 ENCOUNTER — Inpatient Hospital Stay: Attending: Hematology and Oncology

## 2024-03-03 VITALS — BP 115/84 | HR 75 | Temp 97.3°F | Resp 16

## 2024-03-03 DIAGNOSIS — Z87891 Personal history of nicotine dependence: Secondary | ICD-10-CM | POA: Insufficient documentation

## 2024-03-03 DIAGNOSIS — R319 Hematuria, unspecified: Secondary | ICD-10-CM | POA: Insufficient documentation

## 2024-03-03 DIAGNOSIS — Z79899 Other long term (current) drug therapy: Secondary | ICD-10-CM | POA: Insufficient documentation

## 2024-03-03 DIAGNOSIS — Z803 Family history of malignant neoplasm of breast: Secondary | ICD-10-CM | POA: Diagnosis not present

## 2024-03-03 DIAGNOSIS — Z5111 Encounter for antineoplastic chemotherapy: Secondary | ICD-10-CM | POA: Insufficient documentation

## 2024-03-03 DIAGNOSIS — R634 Abnormal weight loss: Secondary | ICD-10-CM | POA: Diagnosis not present

## 2024-03-03 DIAGNOSIS — C61 Malignant neoplasm of prostate: Secondary | ICD-10-CM | POA: Diagnosis present

## 2024-03-03 DIAGNOSIS — Z8042 Family history of malignant neoplasm of prostate: Secondary | ICD-10-CM | POA: Insufficient documentation

## 2024-03-03 LAB — CMP (CANCER CENTER ONLY)
ALT: 6 U/L (ref 0–44)
AST: 11 U/L — ABNORMAL LOW (ref 15–41)
Albumin: 3.9 g/dL (ref 3.5–5.0)
Alkaline Phosphatase: 56 U/L (ref 38–126)
Anion gap: 6 (ref 5–15)
BUN: 22 mg/dL (ref 8–23)
CO2: 28 mmol/L (ref 22–32)
Calcium: 9.5 mg/dL (ref 8.9–10.3)
Chloride: 109 mmol/L (ref 98–111)
Creatinine: 1.18 mg/dL (ref 0.61–1.24)
GFR, Estimated: >60 mL/min (ref >60)
Glucose, Bld: 99 mg/dL (ref 70–99)
Potassium: 4.2 mmol/L (ref 3.5–5.1)
Sodium: 143 mmol/L (ref 135–145)
Total Bilirubin: 0.5 mg/dL (ref 0.0–1.2)
Total Protein: 7.4 g/dL (ref 6.5–8.1)

## 2024-03-03 LAB — CBC WITH DIFFERENTIAL (CANCER CENTER ONLY)
Abs Immature Granulocytes: 0.01 K/uL (ref 0.00–0.07)
Basophils Absolute: 0.1 K/uL (ref 0.0–0.1)
Basophils Relative: 1 %
Eosinophils Absolute: 0.1 K/uL (ref 0.0–0.5)
Eosinophils Relative: 2 %
HCT: 34 % — ABNORMAL LOW (ref 39.0–52.0)
Hemoglobin: 11.4 g/dL — ABNORMAL LOW (ref 13.0–17.0)
Immature Granulocytes: 0 %
Lymphocytes Relative: 22 %
Lymphs Abs: 0.8 K/uL (ref 0.7–4.0)
MCH: 31.2 pg (ref 26.0–34.0)
MCHC: 33.5 g/dL (ref 30.0–36.0)
MCV: 93.2 fL (ref 80.0–100.0)
Monocytes Absolute: 0.3 K/uL (ref 0.1–1.0)
Monocytes Relative: 8 %
Neutro Abs: 2.4 K/uL (ref 1.7–7.7)
Neutrophils Relative %: 67 %
Platelet Count: 198 K/uL (ref 150–400)
RBC: 3.65 MIL/uL — ABNORMAL LOW (ref 4.22–5.81)
RDW: 12.8 % (ref 11.5–15.5)
WBC Count: 3.6 K/uL — ABNORMAL LOW (ref 4.0–10.5)
nRBC: 0 % (ref 0.0–0.2)

## 2024-03-03 NOTE — Progress Notes (Signed)
 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 Noted to have clinical stage T3bN1 with regional lymph node involvement  12/25/2021: Eligard  45 mg started 01/05/2022: Zytiga  1000 mg with prednisone  5 mg daily started  02/14/2022: last visit with Dr. Amadeo. Transitioned to comfort based care after completing 6 (out of 30) radiation treatments  03/17/2023: establish care with Dr. Federico   Interval History:  Antonio Navarro 63 y.o. male with medical history significant for castrate sensitive prostate cancer who presents for a follow up visit. The patient's last visit was on 11/28/2023. In the interim since the last visit he has continued enzalutamide  therapy.  On exam today Antonio Navarro is accompanied by his family.  They report he has been well overall in the interim since our last visit.  He continues taking his enzalutamide  therapy faithfully.  He reports his energy level is about the same and he is eating well.  He denies having pain or hurting anywhere.  He reports that he has had no issues with runny nose, sore throat, cough.  Denies any fevers, chills, sweats.  He reports he is had no difficulty with nausea, vomiting, or diarrhea.  Overall his health has been steady.  A full 10 point ROS is otherwise negative.  MEDICAL HISTORY:  Past Medical History:  Diagnosis Date   Anoxic brain injury (HCC) 07/22/2020   cardiac arrest for 10 minutes prior to resuscitationed by EMS , residual unable to write, gait instability (uses wheelchair) has speech impediment but can communicate   CAD (coronary artery disease), native coronary artery 07/2020   cardiologist--- dr santo;    07-22-2020 s/p  cardiac arrest in setting of STEMI with cath showing 40%pRCA and 95% mLCx ,  DES x1 to mLCx   Dysphagia    intermittant   Family history of adverse reaction to anesthesia    sister--  hard to wake   Gait instability    residual from brain injury ,  uses wheelchair   GERD (gastroesophageal reflux disease)    History of kidney stones    History of ST elevation myocardial infarction (STEMI) 07/22/2020   inferolateral ---  VDRF  shocked 6 times by EMS, intubated,  ef 40%,  post PCI Stent to CFx, 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;   pt had prolonged hospitalization   HLD (hyperlipidemia)    Hypertension    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   Ischemic cardiomyopathy 07/2020   07-22-2020  EF 40% at time of MI;   08-06-2021  ef 55-60% per echo   Malignant neoplasm prostate [facility]) 10/2021   primary urologist-- dr borden/  oncologist--- dr shadad/  radiation oncology-- dr patrcia;  dx 03/ 2023,  Gleason 5+4, PSA 10-81   Nocturia    S/P drug eluting coronary stent placement 07/22/2021   DES x 1 to midCFx   Speech problem    pt is able to communicate needs but has a little impediment   Urinary incontinence    uses depends   Uses wheelchair    uses wheelchair due to gait instability,  can stand and pivot with minminal assist    SURGICAL HISTORY: Past Surgical History:  Procedure Laterality  Date   CORONARY/GRAFT ACUTE MI REVASCULARIZATION N/A 07/22/2020   Procedure: Coronary/Graft Acute MI Revascularization;  Surgeon: Court Dorn PARAS, MD;  Location: [facility] INVASIVE CV LAB;  Service: Cardiovascular;  Laterality: N/A;   GOLD SEED IMPLANT N/A 02/26/2022   Procedure: GOLD SEED IMPLANT;  Surgeon: Devere Lonni Righter, MD;  Location: [facility];  Service: Urology;  Laterality: N/A;  30 MINS FOR THIS CASE   IR GASTROSTOMY TUBE MOD SED  08/17/2020   IR GASTROSTOMY TUBE REMOVAL  11/08/2020   LEFT HEART CATH AND CORONARY ANGIOGRAPHY N/A 07/22/2020    Procedure: LEFT HEART CATH AND CORONARY ANGIOGRAPHY;  Surgeon: Court Dorn PARAS, MD;  Location: [facility] INVASIVE CV LAB;  Service: Cardiovascular;  Laterality: N/A;   PROSTATE BIOPSY N/A 11/13/2021   Procedure: BIOPSY TRANSRECTAL ULTRASONIC PROSTATE (TUBP);  Surgeon: Lovie Arlyss CROME, MD;  Location: [facility]S;  Service: Urology;  Laterality: N/A;  45 MINS FOR THIS CASE   RADIOLOGY WITH ANESTHESIA N/A 10/16/2021   Procedure: MRI WITH ANESTHESIA OF PROSTATE WITH AND WITHOUT CONTRAST;  Surgeon: Radiologist, Medication, MD;  Location: [facility] OR;  Service: Radiology;  Laterality: N/A;   SPACE OAR INSTILLATION N/A 02/26/2022   Procedure: SPACE OAR INSTILLATION;  Surgeon: Devere Lonni Righter, MD;  Location: [facility];  Service: Urology;  Laterality: N/A;    SOCIAL HISTORY: Social History   Socioeconomic History   Marital status: Single    Spouse name: Not on file   Number of children: Not on file   Years of education: Not on file   Highest education level: Not on file  Occupational History   Not on file  Tobacco Use   Smoking status: Former    Current packs/day: 0.00    Average packs/day: 1 pack/day for 40.0 years (40.0 ttl pk-yrs)    Types: Cigarettes    Start date: 07/21/1980    Quit date: 07/21/2020    Years since quitting: 3.6   Smokeless tobacco: Never  Vaping Use   Vaping status: Never Used  Substance and Sexual Activity   Alcohol  use: Not Currently    Comment: none since 12/ 2021   Drug use: Not Currently    Types: Marijuana    Comment: none since 12/ 2021   Sexual activity: Not on file  Other Topics Concern   Not on file  Social History Narrative   Not on file   Social Drivers of Health   Financial Resource Strain: Not on file  Food Insecurity: No Food Insecurity (03/17/2023)   Hunger Vital Sign    Worried About Running Out of Food in the Last Year: Never true    Ran Out of Food in the Last Year: Never true  Transportation Needs: No Transportation Needs  (03/17/2023)   PRAPARE - Administrator, Civil Service (Medical): No    Lack of Transportation (Non-Medical): No  Physical Activity: Not on file  Stress: Not on file  Social Connections: Not on file  Intimate Partner Violence: Patient Unable To Answer (03/17/2023)   Humiliation, Afraid, Rape, and Kick questionnaire    Fear of Current or Ex-Partner: Patient unable to answer    Emotionally Abused: Patient unable to answer    Physically Abused: Patient unable to answer    Sexually Abused: Patient unable to answer    FAMILY HISTORY: Family History  Problem Relation Age of Onset   Cancer Mother        d. age 21; unknown type   Hypertension Father  Congestive Heart Failure Father    Breast cancer Sister 100   Diabetes type II Sister    Hypertension Sister    Hypertension Brother    Autism spectrum disorder Brother    Ovarian cancer Maternal Aunt        dx before age 37   Prostate cancer Maternal Uncle        dx after 70   Breast cancer Paternal Aunt        dx 19s   Cancer Paternal Aunt        dx after 31; unknown type   Ovarian cancer Paternal Grandmother        dx and d. mid 66s    ALLERGIES:  has no known allergies.  MEDICATIONS:  Current Outpatient Medications  Medication Sig Dispense Refill   acetaminophen  (TYLENOL ) 500 MG tablet Take 500 mg by mouth every 6 (six) hours as needed.     aspirin  81 MG chewable tablet Chew 1 tablet (81 mg total) by mouth daily.     atorvastatin  (LIPITOR ) 80 MG tablet Take 1 tablet (80 mg total) by mouth daily. 90 tablet 3   clopidogrel  (PLAVIX ) 75 MG tablet Take 1 tablet (75 mg total) by mouth daily. 90 tablet 3   enzalutamide  (XTANDI ) 40 MG tablet Take 4 tablets (160 mg total) by mouth daily. 120 tablet 2   losartan  (COZAAR ) 25 MG tablet Take 1 tablet (25 mg total) by mouth daily. 90 tablet 3   metoprolol  succinate (TOPROL -XL) 25 MG 24 hr tablet Take 0.5 tablets (12.5 mg total) by mouth 2 (two) times daily. 90 tablet 3    polyethylene glycol (MIRALAX  / GLYCOLAX ) 17 g packet Take 17 g by mouth daily as needed for moderate constipation. 14 each 0   No current facility-administered medications for this visit.    REVIEW OF SYSTEMS:   Constitutional: ( - ) fevers, ( - )  chills , ( - ) night sweats Eyes: ( - ) blurriness of vision, ( - ) double vision, ( - ) watery eyes Ears, nose, mouth, throat, and face: ( - ) mucositis, ( - ) sore throat Respiratory: ( - ) cough, ( - ) dyspnea, ( - ) wheezes Cardiovascular: ( - ) palpitation, ( - ) chest discomfort, ( - ) lower extremity swelling Gastrointestinal:  ( - ) nausea, ( - ) heartburn, ( - ) change in bowel habits Skin: ( - ) abnormal skin rashes Lymphatics: ( - ) new lymphadenopathy, ( - ) easy bruising Neurological: ( - ) numbness, ( - ) tingling, ( - ) new weaknesses Behavioral/Psych: ( - ) mood change, ( - ) new changes  All other systems were reviewed with the patient and are negative.  PHYSICAL EXAMINATION:  Vitals:   03/03/24 1421  BP: 115/84  Pulse: 75  Resp: 16  Temp: (!) 97.3 F (36.3 C)  SpO2: 98%      There were no vitals filed for this visit.   GENERAL: Chronically ill-appearing middle-aged African-American male, alert, no distress and comfortable SKIN: skin color, texture, turgor are normal, no rashes or significant lesions EYES: conjunctiva are pink and non-injected, sclera clear LUNGS: clear to auscultation and percussion with normal breathing effort HEART: regular rate & rhythm and no murmurs and no lower extremity edema Musculoskeletal: no cyanosis of digits and no clubbing  PSYCH: minimally verbal  NEURO: no focal motor/sensory deficits  LABORATORY DATA:  I have reviewed the data as listed    Latest Ref Rng &  Units 03/03/2024    1:57 PM 11/28/2023    2:11 PM 07/31/2023    1:31 PM  CBC  WBC 4.0 - 10.5 K/uL 3.6  3.7  6.3   Hemoglobin 13.0 - 17.0 g/dL 88.5  88.5  88.0   Hematocrit 39.0 - 52.0 % 34.0  33.9  34.8   Platelets  150 - 400 K/uL 198  163  196        Latest Ref Rng & Units 03/03/2024    1:57 PM 11/28/2023    2:11 PM 07/31/2023    1:31 PM  CMP  Glucose 70 - 99 mg/dL 99  881  866   BUN 8 - 23 mg/dL 22  23  29    Creatinine 0.61 - 1.24 mg/dL 8.81  8.84  8.71   Sodium 135 - 145 mmol/L 143  145  147   Potassium 3.5 - 5.1 mmol/L 4.2  4.0  4.2   Chloride 98 - 111 mmol/L 109  111  110   CO2 22 - 32 mmol/L 28  29  30    Calcium  8.9 - 10.3 mg/dL 9.5  9.7  89.8   Total Protein 6.5 - 8.1 g/dL 7.4  7.5  7.8   Total Bilirubin 0.0 - 1.2 mg/dL 0.5  0.4  0.6   Alkaline Phos 38 - 126 U/L 56  57  59   AST 15 - 41 U/L 11  11  12    ALT 0 - 44 U/L 6  7  6     RADIOGRAPHIC STUDIES: No results found.  ASSESSMENT & PLAN Antonio Navarro 63 y.o. male with medical history significant for castrate sensitive prostate cancer who presents for a follow up visit.  # Castrate Sensitive Prostate Cancer -- Currently receives Lupron  shots with urology.  Has been receiving Eligard  every 6 months  -- PSA dropped to <0.1 at last visit on 11/28/2023.  -- Patient unable to tolerate Zytiga  pills due to their size.  Discussed with pharmacy who noted that enzalutamide  would be the smallest oral therapy we could try.   -- continue enzalutamide  160 mg p.o. daily. -- Will administer next Lupron  shot on 03/09/2024.  Will plan for every 3 month injections. --labs today show white blood cell 3.6, hemoglobin 1.4, MCV 93.2, platelets 198.  Creatinine and LFTs within normal limits.  PSA was undetectable but testosterone  rose to 396.   -- Recommend return to clinic in 3 months with ADT injection.   # Blood In Urine -- Etiology is unclear.  Patient is already established with urology.  Recommend he reach out to urology for further evaluation and management.  The family notes they would do this. -- Provided family with a UA cup to assess for urinary tract infection.  He is not having any of the signs or symptoms concerning for UTI.  # Weight  Loss --refered to Cancer Center dietician  No orders of the defined types were placed in this encounter.   All questions were answered. The patient knows to call the clinic with any problems, questions or concerns.  A total of more than 30 minutes were spent on this encounter with face-to-face time and non-face-to-face time, including preparing to see the patient, ordering tests and/or medications, counseling the patient and coordination of care as outlined above.   Norleen IVAR Kidney, MD Department of Hematology/Oncology [facility] Phone: [phone] Pager: [phone] Email: [email or web address]  03/14/2024 2:38 PM

## 2024-03-04 ENCOUNTER — Telehealth: Payer: Self-pay | Admitting: Hematology and Oncology

## 2024-03-04 ENCOUNTER — Encounter: Payer: Self-pay | Admitting: Hematology and Oncology

## 2024-03-04 LAB — TESTOSTERONE: Testosterone: 763 ng/dL (ref 264–916)

## 2024-03-04 LAB — PROSTATE-SPECIFIC AG, SERUM (LABCORP): Prostate Specific Ag, Serum: 0.4 ng/mL (ref 0.0–4.0)

## 2024-03-05 ENCOUNTER — Telehealth: Payer: Self-pay | Admitting: Hematology and Oncology

## 2024-03-05 ENCOUNTER — Other Ambulatory Visit: Payer: Self-pay | Admitting: Hematology and Oncology

## 2024-03-09 ENCOUNTER — Inpatient Hospital Stay

## 2024-03-09 VITALS — BP 108/80 | HR 91 | Temp 98.2°F | Resp 16

## 2024-03-09 DIAGNOSIS — Z5111 Encounter for antineoplastic chemotherapy: Secondary | ICD-10-CM | POA: Diagnosis not present

## 2024-03-09 DIAGNOSIS — C61 Malignant neoplasm of prostate: Secondary | ICD-10-CM

## 2024-03-09 MED ORDER — LEUPROLIDE ACETATE (3 MONTH) 22.5 MG ~~LOC~~ KIT
22.5000 mg | PACK | Freq: Once | SUBCUTANEOUS | Status: AC
  Administered 2024-03-09: 22.5 mg via SUBCUTANEOUS
  Filled 2024-03-09: qty 22.5

## 2024-03-14 ENCOUNTER — Encounter: Payer: Self-pay | Admitting: Hematology and Oncology

## 2024-03-15 ENCOUNTER — Other Ambulatory Visit (HOSPITAL_COMMUNITY): Payer: Self-pay

## 2024-03-17 ENCOUNTER — Other Ambulatory Visit: Payer: Self-pay

## 2024-03-19 ENCOUNTER — Other Ambulatory Visit: Payer: Self-pay

## 2024-03-19 DIAGNOSIS — N200 Calculus of kidney: Secondary | ICD-10-CM

## 2024-03-19 DIAGNOSIS — D649 Anemia, unspecified: Secondary | ICD-10-CM

## 2024-03-19 HISTORY — DX: Anemia, unspecified: D64.9

## 2024-03-19 HISTORY — DX: Calculus of kidney: N20.0

## 2024-03-26 ENCOUNTER — Encounter: Payer: Self-pay | Admitting: Hematology and Oncology

## 2024-04-02 ENCOUNTER — Observation Stay (HOSPITAL_COMMUNITY)

## 2024-04-02 ENCOUNTER — Emergency Department (HOSPITAL_COMMUNITY)

## 2024-04-02 ENCOUNTER — Encounter (HOSPITAL_COMMUNITY)
Admission: EM | Disposition: A | Discharge status: Home Health | Payer: Self-pay | Source: Home / Self Care | Attending: Internal Medicine

## 2024-04-02 ENCOUNTER — Other Ambulatory Visit: Payer: Self-pay

## 2024-04-02 ENCOUNTER — Inpatient Hospital Stay (HOSPITAL_COMMUNITY)
Admission: EM | Admit: 2024-04-02 | Discharge: 2024-04-08 | DRG: 660 | Disposition: A | Discharge status: Home Health | Attending: Internal Medicine | Admitting: Internal Medicine

## 2024-04-02 ENCOUNTER — Emergency Department (HOSPITAL_COMMUNITY): Admitting: Certified Registered Nurse Anesthetist

## 2024-04-02 ENCOUNTER — Encounter (HOSPITAL_COMMUNITY): Payer: Self-pay | Admitting: Certified Registered Nurse Anesthetist

## 2024-04-02 DIAGNOSIS — C61 Malignant neoplasm of prostate: Secondary | ICD-10-CM | POA: Insufficient documentation

## 2024-04-02 DIAGNOSIS — R159 Full incontinence of feces: Secondary | ICD-10-CM | POA: Insufficient documentation

## 2024-04-02 DIAGNOSIS — R4701 Aphasia: Secondary | ICD-10-CM | POA: Diagnosis present

## 2024-04-02 DIAGNOSIS — D649 Anemia, unspecified: Secondary | ICD-10-CM

## 2024-04-02 DIAGNOSIS — Z741 Need for assistance with personal care: Secondary | ICD-10-CM | POA: Insufficient documentation

## 2024-04-02 DIAGNOSIS — N209 Urinary calculus, unspecified: Secondary | ICD-10-CM | POA: Diagnosis present

## 2024-04-02 DIAGNOSIS — R64 Cachexia: Secondary | ICD-10-CM | POA: Insufficient documentation

## 2024-04-02 DIAGNOSIS — N179 Acute kidney failure, unspecified: Principal | ICD-10-CM

## 2024-04-02 DIAGNOSIS — I255 Ischemic cardiomyopathy: Secondary | ICD-10-CM | POA: Insufficient documentation

## 2024-04-02 DIAGNOSIS — Z8669 Personal history of other diseases of the nervous system and sense organs: Secondary | ICD-10-CM

## 2024-04-02 DIAGNOSIS — R310 Gross hematuria: Secondary | ICD-10-CM | POA: Insufficient documentation

## 2024-04-02 DIAGNOSIS — D62 Acute posthemorrhagic anemia: Secondary | ICD-10-CM

## 2024-04-02 DIAGNOSIS — R32 Unspecified urinary incontinence: Secondary | ICD-10-CM | POA: Insufficient documentation

## 2024-04-02 DIAGNOSIS — N390 Urinary tract infection, site not specified: Secondary | ICD-10-CM | POA: Insufficient documentation

## 2024-04-02 DIAGNOSIS — I25810 Atherosclerosis of coronary artery bypass graft(s) without angina pectoris: Secondary | ICD-10-CM | POA: Insufficient documentation

## 2024-04-02 DIAGNOSIS — E43 Unspecified severe protein-calorie malnutrition: Secondary | ICD-10-CM | POA: Insufficient documentation

## 2024-04-02 DIAGNOSIS — R1310 Dysphagia, unspecified: Secondary | ICD-10-CM

## 2024-04-02 DIAGNOSIS — Z87442 Personal history of urinary calculi: Secondary | ICD-10-CM | POA: Insufficient documentation

## 2024-04-02 DIAGNOSIS — Z8546 Personal history of malignant neoplasm of prostate: Secondary | ICD-10-CM

## 2024-04-02 DIAGNOSIS — Z66 Do not resuscitate: Secondary | ICD-10-CM | POA: Diagnosis present

## 2024-04-02 DIAGNOSIS — I428 Other cardiomyopathies: Secondary | ICD-10-CM | POA: Diagnosis not present

## 2024-04-02 DIAGNOSIS — Z6820 Body mass index (BMI) 20.0-20.9, adult: Secondary | ICD-10-CM

## 2024-04-02 DIAGNOSIS — Z7902 Long term (current) use of antithrombotics/antiplatelets: Secondary | ICD-10-CM

## 2024-04-02 DIAGNOSIS — N202 Calculus of kidney with calculus of ureter: Principal | ICD-10-CM | POA: Diagnosis present

## 2024-04-02 DIAGNOSIS — Z8051 Family history of malignant neoplasm of kidney: Secondary | ICD-10-CM

## 2024-04-02 DIAGNOSIS — Z923 Personal history of irradiation: Secondary | ICD-10-CM

## 2024-04-02 DIAGNOSIS — G9349 Other encephalopathy: Secondary | ICD-10-CM | POA: Diagnosis present

## 2024-04-02 DIAGNOSIS — Z79899 Other long term (current) drug therapy: Secondary | ICD-10-CM

## 2024-04-02 DIAGNOSIS — R000 Tachycardia, unspecified: Secondary | ICD-10-CM | POA: Diagnosis present

## 2024-04-02 DIAGNOSIS — R7881 Bacteremia: Secondary | ICD-10-CM | POA: Diagnosis present

## 2024-04-02 DIAGNOSIS — I2510 Atherosclerotic heart disease of native coronary artery without angina pectoris: Secondary | ICD-10-CM | POA: Diagnosis not present

## 2024-04-02 DIAGNOSIS — I5032 Chronic diastolic (congestive) heart failure: Secondary | ICD-10-CM | POA: Diagnosis present

## 2024-04-02 DIAGNOSIS — B964 Proteus (mirabilis) (morganii) as the cause of diseases classified elsewhere: Secondary | ICD-10-CM | POA: Diagnosis present

## 2024-04-02 DIAGNOSIS — M6284 Sarcopenia: Secondary | ICD-10-CM | POA: Diagnosis present

## 2024-04-02 DIAGNOSIS — Z515 Encounter for palliative care: Secondary | ICD-10-CM

## 2024-04-02 DIAGNOSIS — Z7982 Long term (current) use of aspirin: Secondary | ICD-10-CM

## 2024-04-02 DIAGNOSIS — E785 Hyperlipidemia, unspecified: Secondary | ICD-10-CM | POA: Diagnosis present

## 2024-04-02 DIAGNOSIS — I110 Hypertensive heart disease with heart failure: Secondary | ICD-10-CM | POA: Diagnosis present

## 2024-04-02 DIAGNOSIS — Z8674 Personal history of sudden cardiac arrest: Secondary | ICD-10-CM

## 2024-04-02 DIAGNOSIS — I252 Old myocardial infarction: Secondary | ICD-10-CM

## 2024-04-02 DIAGNOSIS — G931 Anoxic brain damage, not elsewhere classified: Secondary | ICD-10-CM | POA: Diagnosis present

## 2024-04-02 HISTORY — PX: CYSTOSCOPY W/ URETERAL STENT PLACEMENT: SHX1429

## 2024-04-02 LAB — CBC WITH DIFFERENTIAL/PLATELET
Abs Immature Granulocytes: 0.08 K/uL — ABNORMAL HIGH (ref 0.00–0.07)
Basophils Absolute: 0.1 K/uL (ref 0.0–0.1)
Basophils Relative: 1 %
Eosinophils Absolute: 0 K/uL (ref 0.0–0.5)
Eosinophils Relative: 0 %
HCT: 24.1 % — ABNORMAL LOW (ref 39.0–52.0)
Hemoglobin: 7.3 g/dL — ABNORMAL LOW (ref 13.0–17.0)
Immature Granulocytes: 1 %
Lymphocytes Relative: 10 %
Lymphs Abs: 1.2 K/uL (ref 0.7–4.0)
MCH: 29.8 pg (ref 26.0–34.0)
MCHC: 30.3 g/dL (ref 30.0–36.0)
MCV: 98.4 fL (ref 80.0–100.0)
Monocytes Absolute: 0.5 K/uL (ref 0.1–1.0)
Monocytes Relative: 4 %
Neutro Abs: 10.8 K/uL — ABNORMAL HIGH (ref 1.7–7.7)
Neutrophils Relative %: 84 %
Platelets: 207 K/uL (ref 150–400)
RBC: 2.45 MIL/uL — ABNORMAL LOW (ref 4.22–5.81)
RDW: 14.1 % (ref 11.5–15.5)
WBC: 12.7 K/uL — ABNORMAL HIGH (ref 4.0–10.5)
nRBC: 0 % (ref 0.0–0.2)

## 2024-04-02 LAB — COMPREHENSIVE METABOLIC PANEL WITH GFR
ALT: 8 U/L (ref 0–44)
AST: 15 U/L (ref 15–41)
Albumin: 2.7 g/dL — ABNORMAL LOW (ref 3.5–5.0)
Alkaline Phosphatase: 48 U/L (ref 38–126)
Anion gap: 8 (ref 5–15)
BUN: 26 mg/dL — ABNORMAL HIGH (ref 8–23)
CO2: 26 mmol/L (ref 22–32)
Calcium: 9 mg/dL (ref 8.9–10.3)
Chloride: 105 mmol/L (ref 98–111)
Creatinine, Ser: 2.16 mg/dL — ABNORMAL HIGH (ref 0.61–1.24)
GFR, Estimated: 34 mL/min — ABNORMAL LOW (ref >60)
Glucose, Bld: 120 mg/dL — ABNORMAL HIGH (ref 70–99)
Potassium: 4.7 mmol/L (ref 3.5–5.1)
Sodium: 139 mmol/L (ref 135–145)
Total Bilirubin: 0.7 mg/dL (ref 0.0–1.2)
Total Protein: 8.3 g/dL — ABNORMAL HIGH (ref 6.5–8.1)

## 2024-04-02 LAB — RESP PANEL BY RT-PCR (RSV, FLU A&B, COVID)  RVPGX2
Influenza A by PCR: NEGATIVE
Influenza B by PCR: NEGATIVE
Resp Syncytial Virus by PCR: NEGATIVE
SARS Coronavirus 2 by RT PCR: NEGATIVE

## 2024-04-02 LAB — URINALYSIS, W/ REFLEX TO CULTURE (INFECTION SUSPECTED): RBC / HPF: >50 RBC/hpf (ref 0–5)

## 2024-04-02 LAB — I-STAT CG4 LACTIC ACID, ED: Lactic Acid, Venous: 1.8 mmol/L (ref 0.5–1.9)

## 2024-04-02 LAB — POC OCCULT BLOOD, ED: Fecal Occult Bld: NEGATIVE

## 2024-04-02 LAB — AMMONIA: Ammonia: 13 umol/L (ref 9–35)

## 2024-04-02 LAB — ABO/RH: ABO/RH(D): O POS

## 2024-04-02 LAB — PREPARE RBC (CROSSMATCH)

## 2024-04-02 SURGERY — CYSTOSCOPY, WITH RETROGRADE PYELOGRAM AND URETERAL STENT INSERTION
Anesthesia: General | Site: Ureter | Laterality: Bilateral

## 2024-04-02 MED ORDER — SUCCINYLCHOLINE CHLORIDE 200 MG/10ML IV SOSY
PREFILLED_SYRINGE | INTRAVENOUS | Status: AC
Start: 2024-04-02 — End: 2024-04-02
  Filled 2024-04-02: qty 10

## 2024-04-02 MED ORDER — LIDOCAINE 2% (20 MG/ML) 5 ML SYRINGE
INTRAMUSCULAR | Status: AC
Start: 2024-04-02 — End: 2024-04-02
  Filled 2024-04-02: qty 5

## 2024-04-02 MED ORDER — ACETAMINOPHEN 650 MG RE SUPP: 650.0000 mg | Freq: Four times a day (QID) | RECTAL | Status: DC | PRN

## 2024-04-02 MED ORDER — IOHEXOL 300 MG/ML  SOLN
INTRAMUSCULAR | Status: DC | PRN
  Administered 2024-04-02: 17 mL

## 2024-04-02 MED ORDER — FENTANYL CITRATE (PF) 100 MCG/2ML IJ SOLN
INTRAMUSCULAR | Status: DC | PRN
  Administered 2024-04-02 (×2): 25 ug via INTRAVENOUS

## 2024-04-02 MED ORDER — SODIUM CHLORIDE 0.9 % IV BOLUS
1000.0000 mL | Freq: Once | INTRAVENOUS | Status: AC
  Administered 2024-04-02: 1000 mL via INTRAVENOUS

## 2024-04-02 MED ORDER — SODIUM CHLORIDE 0.9% IV SOLUTION: Freq: Once | INTRAVENOUS | Status: DC

## 2024-04-02 MED ORDER — HYDROMORPHONE HCL 1 MG/ML IJ SOLN: 0.5000 mg | INTRAMUSCULAR | Status: DC | PRN

## 2024-04-02 MED ORDER — FENTANYL CITRATE (PF) 100 MCG/2ML IJ SOLN: 25.0000 ug | INTRAMUSCULAR | Status: DC | PRN

## 2024-04-02 MED ORDER — PROPOFOL 10 MG/ML IV BOLUS
INTRAVENOUS | Status: DC | PRN
  Administered 2024-04-02: 100 mg via INTRAVENOUS

## 2024-04-02 MED ORDER — AMISULPRIDE (ANTIEMETIC) 5 MG/2ML IV SOLN: 10.0000 mg | Freq: Once | INTRAVENOUS | Status: DC | PRN

## 2024-04-02 MED ORDER — ONDANSETRON HCL 4 MG/2ML IJ SOLN
INTRAMUSCULAR | Status: AC
  Filled 2024-04-02: qty 2

## 2024-04-02 MED ORDER — ACETAMINOPHEN 325 MG PO TABS
650.0000 mg | ORAL_TABLET | Freq: Four times a day (QID) | ORAL | Status: DC | PRN
  Administered 2024-04-03 – 2024-04-04 (×2): 650 mg via ORAL
  Filled 2024-04-02 (×2): qty 2

## 2024-04-02 MED ORDER — PHENYLEPHRINE HCL (PRESSORS) 10 MG/ML IV SOLN
INTRAVENOUS | Status: DC | PRN
  Administered 2024-04-02: 80 ug via INTRAVENOUS
  Administered 2024-04-02: 160 ug via INTRAVENOUS

## 2024-04-02 MED ORDER — LIDOCAINE HCL (CARDIAC) PF 100 MG/5ML IV SOSY
PREFILLED_SYRINGE | INTRAVENOUS | Status: DC | PRN
  Administered 2024-04-02: 40 mg via INTRAVENOUS

## 2024-04-02 MED ORDER — SODIUM CHLORIDE 0.9 % IV BOLUS: 500.0000 mL | Freq: Once | INTRAVENOUS | Status: DC

## 2024-04-02 MED ORDER — PROPOFOL 10 MG/ML IV BOLUS
INTRAVENOUS | Status: AC
  Filled 2024-04-02: qty 20

## 2024-04-02 MED ORDER — ONDANSETRON HCL 4 MG/2ML IJ SOLN
INTRAMUSCULAR | Status: DC | PRN
  Administered 2024-04-02: 4 mg via INTRAVENOUS

## 2024-04-02 MED ORDER — HYDROMORPHONE HCL 1 MG/ML IJ SOLN
0.5000 mg | INTRAMUSCULAR | Status: DC | PRN
  Administered 2024-04-02: 1 mg via INTRAVENOUS
  Filled 2024-04-02: qty 1

## 2024-04-02 MED ORDER — SODIUM CHLORIDE 0.9 % IV SOLN
2.0000 g | Freq: Once | INTRAVENOUS | Status: AC
  Administered 2024-04-02: 2 g via INTRAVENOUS
  Filled 2024-04-02: qty 20

## 2024-04-02 MED ORDER — LACTATED RINGERS IV SOLN: INTRAVENOUS | Status: DC | PRN

## 2024-04-02 MED ORDER — FENTANYL CITRATE (PF) 250 MCG/5ML IJ SOLN
INTRAMUSCULAR | Status: AC
  Filled 2024-04-02: qty 5

## 2024-04-02 MED ORDER — SODIUM CHLORIDE 0.9 % IV SOLN: INTRAVENOUS | Status: DC | PRN

## 2024-04-02 SURGICAL SUPPLY — 26 items
BAG DRAIN URO-CYSTO SKYTR STRL (DRAIN) ×1 IMPLANT
BASKET ZERO TIP NITINOL 2.4FR (BASKET) IMPLANT
BENZOIN TINCTURE PRP APPL 2/3 (GAUZE/BANDAGES/DRESSINGS) IMPLANT
CATH HEMA 3WAY 30CC 22FR COUDE (CATHETERS) IMPLANT
CATH HEMATURIA 20FR (CATHETERS) IMPLANT
CATH URETL OPEN 5X70 (CATHETERS) IMPLANT
EXTRACTOR STONE 1.7FRX115CM (UROLOGICAL SUPPLIES) IMPLANT
GLOVE BIO SURGEON STRL SZ8 (GLOVE) ×1 IMPLANT
GOWN STRL SURGICAL XL XLNG (GOWN DISPOSABLE) ×1 IMPLANT
GUIDEWIRE STR DUAL SENSOR (WIRE) IMPLANT
GUIDEWIRE ZIPWRE .038 STRAIGHT (WIRE) ×1 IMPLANT
KIT TURNOVER KIT B (KITS) ×1 IMPLANT
LASER FIB FLEXIVA PULSE ID 365 (Laser) IMPLANT
MANIFOLD NEPTUNE II (INSTRUMENTS) ×1 IMPLANT
NS IRRIG 500ML POUR BTL (IV SOLUTION) ×1 IMPLANT
PACK CYSTO (CUSTOM PROCEDURE TRAY) ×1 IMPLANT
SHEATH NAVIGATOR HD 11/13X28 (SHEATH) IMPLANT
SHEATH NAVIGATOR HD 11/13X36 (SHEATH) IMPLANT
SHEATH NAVIGATOR HD 12/14X46 (SHEATH) IMPLANT
SLEEVE SCD COMPRESS KNEE MED (STOCKING) ×1 IMPLANT
STENT URET 6FRX24 CONTOUR (STENTS) IMPLANT
STRIP CLOSURE SKIN 1/2X4 (GAUZE/BANDAGES/DRESSINGS) IMPLANT
SYR 10ML LL (SYRINGE) ×1 IMPLANT
SYRINGE TOOMEY IRRIG 70ML (MISCELLANEOUS) IMPLANT
TUBE CONNECTING 12X1/4 (SUCTIONS) ×1 IMPLANT
TUBING UROLOGY SET (TUBING) ×1 IMPLANT

## 2024-04-02 NOTE — Transfer of Care (Signed)
 Immediate Anesthesia Transfer of Care Note  Patient: Antonio Navarro  Procedure(s) Performed: CYSTOSCOPY, WITH RETROGRADE PYELOGRAM AND URETERAL STENT INSERTION (Bilateral)  Patient Location: PACU  Anesthesia Type:General  Level of Consciousness: drowsy  Airway & Oxygen Therapy: Patient Spontanous Breathing and Patient connected to nasal cannula oxygen  Post-op Assessment: Report given to RN and Post -op Vital signs reviewed and stable  Post vital signs: Reviewed and stable  Last Vitals:  Vitals Value Taken Time  BP 160/89 04/02/24 23:15  Temp 36.8 C 04/02/24 23:15  Pulse 102 04/02/24 23:20  Resp 11 04/02/24 23:20  SpO2 99 % 04/02/24 23:20  Vitals shown include unfiled device data.  Last Pain:  Vitals:   04/02/24 2117  TempSrc: Oral         Complications: No notable events documented.

## 2024-04-02 NOTE — ED Notes (Signed)
 Report given to [employer], CRNA

## 2024-04-02 NOTE — H&P (Addendum)
 Date: 04/02/2024               Patient Name:  Antonio Navarro MRN: [id number]  DOB: 03-08-1961 Age / Sex: 63 y.o., male   PCP: Pcp, No         Medical Service: Internal Medicine Teaching Service         Attending Physician: Dr. Mliss Pouch      First Contact: Jaden Amibilia, DO}    Second Contact: Dr. Lonni Africa, DO         Pager Information: First Contact Pager: [phone]   Second Contact Pager: [phone]   SUBJECTIVE   Chief Complaint: blood in urine and fatigue  History of Present Illness: Antonio Navarro is a 63 y.o. 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 He is accompanied by his partner [name] in person and his sister [name] via phone who provide collateral. They have noticed patient having increased fatigue and not acting himself for the last week and half. He is usually able to be communicated with via yes or no questions, but they have had increased difficulty communicating with him in this time. He has not eaten normally in 3-4 days and has not eaten more than small bites of food intermittently. He has not been orally hydrating as normal. He was having some hematuria on and off during this time as well. [name] and [name] observed him having large blood clots and gross blood in his urine today (8/15), and brought him to the ED. Endorses fatigue, intermittent belly breathing, blood around the mouth, intermittent shaking, ongoing unintentional weight loss, and persistent hematuria. Denies observable abdominal pain, dysuria, night sweats, fever, chills, chest pain, diarrhea, constipation, cough, congestion, nausea, or vomiting. He wears diapers due to urinary incontinence chronically, so unable to gather information about urinary frequency,  urinary incontinence, or fecal incontinence. Patient is on regimen of medications for his prostrate cancer, and these are managed by his sister and partner who say he is taking them as prescribed. Patient merged chart is attached under Patient Chart Advisories tab.   ED Course: Labs significant for WBC 12.7, RBC 2.45, Hgb 7.3, HCT 24.1, glucose 120, BUN 26, Scr, 2.16, Total protein 8.3, albumin 2.7, GFR 34, lactic acid 1.8, UA red, turbid, few bacteria, and incomplete report due to color interference of urine pigment.   Imaging: CXR, CT head, CT renal study  Received NS bolus, NS infusion, ceftriaxone  2g IV  Consulted urology  Meds:  Patient reported:  Eligard 45mg  injection every 6 months Lupron injection every 3 months  Rest as reported below  Current Meds  Medication Sig   aspirin EC 81 MG tablet Take 81 mg by mouth daily. Swallow whole.   atorvastatin  (LIPITOR ) 80 MG tablet Take 80 mg by mouth daily.   clopidogrel (PLAVIX) 75 MG tablet Take 75 mg by mouth daily.   enzalutamide  (XTANDI ) 40 MG tablet Take 40 mg by mouth in the morning, at noon, in the evening, and at bedtime.   losartan (COZAAR) 25 MG tablet Take 25 mg by mouth daily.   metoprolol  tartrate (LOPRESSOR ) 25 MG tablet Take 12.5 mg by mouth 2 (two) times daily.   ondansetron  (ZOFRAN -ODT) 4 MG disintegrating tablet Take 4 mg by mouth every 8 (eight) hours as needed for nausea or vomiting.   [  DISCONTINUED] polyethylene glycol (MIRALAX  / GLYCOLAX ) 17 g packet Take 17 g by mouth daily as needed for severe constipation.    Past Medical History Prostate cancer (clinical stage T3bN1) Hematuria Urinary incontinence Anoxic brain injury Cardiac arrest STEMI HLD HTN CAD GERD dysphagia  Past Surgical History History reviewed. No pertinent surgical history.   Social:  Lives With: partner Occupation: not working Support: partner and sister Level of Function: dependent, non-verbal and mobility limited [facility] Substances: -Tobacco: not since 2021 -Alcohol : not since 2021 -Recreational Drug: not since 2021  Family History:  History reviewed. No pertinent family history.  Mother: kidney cancer Maternal grandmother: ovarian cancer Allergies: Allergies as of 04/02/2024   (No Known Allergies)    Review of Systems: A complete ROS was negative except as per HPI.   OBJECTIVE:   Physical Exam: Blood pressure 130/73, pulse (!) 117, temperature 98.8 F (37.1 C), temperature source Oral, resp. rate (!) 24, height 5' 9 (1.753 m), weight 54.4 kg, SpO2 100%.  Constitutional: Cachectic appearing, has bilaterally flexed knees, hips, arms, and wrists. Is awake upon noise and conversation, but is falling in and out of sleep. In no acute distress. HENT: normocephalic atraumatic, dry mucous membranes. Blood is crusted around the lips with superficial wound noted on bottom lip.  Eyes: conjunctiva non-erythematous. Sclera anicteric.  Cardiovascular: Tachycardia, no m/r/g.  Pulmonary/Chest: no increased work of breathing Abdominal: soft and non-distended. There is tenderness to palpation of the bilateral lower quadrants, observed with patient guarding and grimacing. Extremities: atrophic muscle in bilateral lower extremities. No pretibial edema of bilateral lower extremities. Neurological: Unable to respond to commands or converse. PERRL Skin: dry and warm to touch  Labs: CBC    Component Value Date/Time   WBC 12.7 (H) 04/02/2024 1326   RBC 2.45 (L) 04/02/2024 1326   HGB 7.3 (L) 04/02/2024 1326   HCT 24.1 (L) 04/02/2024 1326   PLT 207 04/02/2024 1326   MCV 98.4 04/02/2024 1326   MCH 29.8 04/02/2024 1326   MCHC 30.3 04/02/2024 1326   RDW 14.1 04/02/2024 1326   LYMPHSABS 1.2 04/02/2024 1326   MONOABS 0.5 04/02/2024 1326   EOSABS 0.0 04/02/2024 1326   BASOSABS 0.1 04/02/2024 1326     CMP     Component Value Date/Time   NA 139 04/02/2024 1326   K 4.7 04/02/2024 1326   CL 105  04/02/2024 1326   CO2 26 04/02/2024 1326   GLUCOSE 120 (H) 04/02/2024 1326   BUN 26 (H) 04/02/2024 1326   CREATININE 2.16 (H) 04/02/2024 1326   CALCIUM  9.0 04/02/2024 1326   PROT 8.3 (H) 04/02/2024 1326   ALBUMIN 2.7 (L) 04/02/2024 1326   AST 15 04/02/2024 1326   ALT 8 04/02/2024 1326   ALKPHOS 48 04/02/2024 1326   BILITOT 0.7 04/02/2024 1326   GFRNONAA 34 (L) 04/02/2024 1326    Imaging: CT Renal Stone Study Result Date: 04/02/2024 CLINICAL DATA:  Abdominal flank pain.  Stone suspected EXAM: CT ABDOMEN AND PELVIS WITHOUT CONTRAST TECHNIQUE: Multidetector CT imaging of the abdomen and pelvis was performed following the standard protocol without IV contrast. RADIATION DOSE REDUCTION: This exam was performed according to the departmental dose-optimization program which includes automated exposure control, adjustment of the mA and/or kV according to patient size and/or use of iterative reconstruction technique. COMPARISON:  PET-CT 04/03/2023 FINDINGS: Lower chest: Lung bases are clear. Hepatobiliary: No focal hepatic lesion. Normal gallbladder. No biliary duct dilatation. Common bile duct is normal. Pancreas: Pancreas is normal.  No ductal dilatation. No pancreatic inflammation. Spleen: Normal spleen Adrenals/urinary tract: Adrenal glands normal. There is hydronephrosis and hydroureter of the LEFT collecting system. There is a large obstructing calculus in the mid LEFT ureter measuring 9 mm (image 46/series 3) there is high-density material within the ureter and renal pelvis proximal to the obstructing calculus which could indicate hemorrhage. Additional nonobstructing calculi in the LEFT kidney measuring 11 mm. Multiple calculi in the RIGHT kidney and RIGHT renal pelvis. No evidence obstruction. No RIGHT ureterolithiasis. No bladder calculi. There is high-density material within the lumen of the bladder nearly filling the entire bladder. Findings concerning for clot within the bladder versus less likely  bladder neoplasm. Stomach/Bowel: The stomach, duodenum, and small bowel normal. The colon and rectosigmoid colon are normal. Vascular/Lymphatic: Abdominal aorta is normal caliber with atherosclerotic calcification. There is no retroperitoneal or periportal lymphadenopathy. No pelvic lymphadenopathy. Reproductive: Prostate unremarkable fiducial markers within the prostate gland. Other: Fluid within the LEFT inguinal canal.  No change from prior. Musculoskeletal: No aggressive osseous lesion. IMPRESSION: 1. Large obstructing calculus in the mid LEFT ureter. LEFT hydroureter. 2. High-density material in the LEFT renal pelvis and LEFT ureter proximal to the obstructing calculus. Findings concerning for hemorrhage within the LEFT renal pelvis and ureter. 3. High-density material within the lumen of the bladder concerning for clot versus less likely bladder neoplasm. 4. Bilateral nephrolithiasis. 5.  Aortic Atherosclerosis (ICD10-I70.0). Electronically Signed   By: Jackquline Boxer M.D.   On: 04/02/2024 19:25   CT Head Wo Contrast Result Date: 04/02/2024 CLINICAL DATA:  Lethargy. EXAM: CT HEAD WITHOUT CONTRAST TECHNIQUE: Contiguous axial images were obtained from the base of the skull through the vertex without intravenous contrast. RADIATION DOSE REDUCTION: This exam was performed according to the departmental dose-optimization program which includes automated exposure control, adjustment of the mA and/or kV according to patient size and/or use of iterative reconstruction technique. COMPARISON:  April 06, 2021 FINDINGS: Brain: There is generalized cerebral atrophy with widening of the extra-axial spaces and ventricular dilatation. There are areas of decreased attenuation within the white matter tracts of the supratentorial brain, consistent with microvascular disease changes. A chronic right occipital lobe infarct is seen. A chronic right basal ganglia lacunar infarct is noted. Vascular: Marked severity bilateral  cavernous carotid artery calcification is noted. Skull: Normal. Negative for fracture or focal lesion. Sinuses/Orbits: No acute finding. Other: None. IMPRESSION: 1. Generalized cerebral atrophy with chronic white matter small vessel ischemic changes. 2. Chronic right occipital lobe infarct. 3. Chronic right basal ganglia lacunar infarct. 4. No acute intracranial abnormality. Electronically Signed   By: Suzen Dials M.D.   On: 04/02/2024 16:40   DG Chest Portable 1 View Result Date: 04/02/2024 CLINICAL DATA:  EMS concern for infection.  Some cough. EXAM: PORTABLE CHEST 1 VIEW COMPARISON:  None Available. FINDINGS: Low lung volume. Bilateral lung fields are clear. Bilateral costophrenic angles are clear. Normal cardio-mediastinal silhouette. No acute osseous abnormalities. The soft tissues are within normal limits. IMPRESSION: No active disease. Electronically Signed   By: Ree Molt M.D.   On: 04/02/2024 13:51     EKG: Normal sinus rhythm. EKG does not include lead V1  ASSESSMENT & PLAN:   Assessment & Plan by Problem: Principal Problem:   Urolithiasis Active Problems:   Prostate cancer (HCC)   Cardiomyopathy, ischemic   History of anoxic brain injury   Incontinence of bowel   Incontinence of urine   CAD (coronary artery disease) of artery bypass graft   Dysphagia  History of kidney stones   UTI (urinary tract infection)   Requires assistance with all activities of daily living (ADL)   Cachexia (HCC)   Gross hematuria   Acute on chronic blood loss anemia   AKI (acute kidney injury) (HCC)   Antonio Navarro is a 63 y.o. 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 Additionally, suggestive of hemorrhage within the left renal pelvis and ureter and clot in the bladder. Urology consulted and recommending cystoscopy with clot evacuation and bilateral JJ stent placement to be done tonight as patient is having gross hematuria. FOBT negative, so source of bleeding likely GU in nature. Patient nonverbal, but guarding and grimacing is visible on abdominal portion of physical exam.  Hgb in ED 7.1; likely explained by acute blood loss.  -dilaudid  q4h -AM CMP -cystoscopy with clot evacuation and bilateral JJ stent placement tonight (8/15) -urology following -transfusion 1 unit  UTI Leukocytosis Infectious encephalopathy -Patient support [name] and [name] have noticed that patient had become more fatigued and less interactive, giving rise to concern for acute encephalopathy.  -Cause of potential acute encephalopathy is most likely 2/2 infection in setting of leukocytosis, worsening obstructive urolithiasis, and UA revealing of bacteria. Will treat with antibiotics and continue to monitor neuro status compared to baseline via caretaker input as patient is non-verbal and immobile at baseline.  -BC pending -Ceftraixone 2g IV -AM CBC w Diff  Hx of anoxic brain injury Tongue/Lip Biting Dysphagia Cachexia -At baseline, patient is non-ambulatory and fully dependent for ADL's, however will interact with those around him with facial expressions and is able to communicate with head nodding/shaking in response to yes/no questions.  -On exam, he is visibly frail, with cachetic appearance, likely multifactorial 2/2 to anoxic brain injury, immobility, sarcopenia, and poor oral intake. BMI is 17.7 -Superficial wound noted on bottom lip with blood crusting around mouth. Likely self-inflicted from chronic lip  biting. -Per family, pt has dysphagia at baseline with reports that he will choke on things like hotdogs, but with other things he is fine. At this time, will keep pt NPO due to high aspiration risk and ask SLP to evaluate tomorrow. -Options for nutritional support will have to be evaluated s/p procedure, clinical improvement during admission, and dysphagia evaluation.  AKI Per other chart, patient baseline Scr is around 1.15 and on ED admission is now 2.16. Suspect this is multifactorial, 2/2 to low PO intake and also post-obstructive from obstructive urolithiasis. Will continue to monitor with trending Scr and oral hydration, s/p urological procedure and NS bolus.  -AM CMP -hold GDMT  HFrecEF Per EMR, patient has history of HFrecEF with most recent echo in 2022 showing EF 55-60%.  -Will hold GDMT currently in the setting of AKI and not wanting to blunt tachycardia, as these findings are indicative of distress and pain in non-verbal patient.   Prostate cancer (clinical stage T3bN1) Patient is following with hematology/oncology physician Dr. Federico. He currently receives injections but no longer receives radiation treatments as of 01/2022.   Goals of Care Patient was transitioned to comfort based care after completing 6 (out of 30) radiation treatments  in 01/2022 per most recent oncology note. A goals of care conversation was conducted at beside with patient, [name], and [name] in regard to code status. Explained that patient has multiple chronic comorbidities that would increase risk of cardiovascular events and likelihood of tolerating compressions/resuscitation measures. Discussed the importance of goals of care conversation prior to any medical interventions. [name] and [name] are his medical decision makers and opt for full code status at this time and are ok with patient receiving blood. Similar conversations documented in previous encounters in EMR.  -full code status is documented  Best  practice: Diet: NPO VTE: None IVF: NS,10cc/hr Code: Full  Disposition planning: Prior to Admission Living Arrangement: Home, living with partner Anticipated Discharge Location: Home  Dispo: Admit patient to Inpatient with expected length of stay greater than 2 midnights.  I was personally present and re-performed the exam and medical decision making and verified the service and findings are accurately documented in the student's note.  Schuyler Novak, DO 04/02/2024 11:29 PM   Signed: Novak Schuyler, DO MS3 04/02/2024, 11:29 PM  On Call pager: [phone]

## 2024-04-02 NOTE — ED Notes (Signed)
 Unable to gain access for labs or IV line. IV team consult placed.

## 2024-04-02 NOTE — Op Note (Signed)
 Operative Note  Preoperative diagnosis:  1.  Gross hematuria 2.  1 cm left proximal ureteral stone 3.  Multiple right renal/UPJ stones  Postoperative diagnosis: 1.  Same  Procedure(s): 1.  Cystoscopy with bilateral ureteral stent placement 2.  Clot evacuation 3.  Bilateral retrograde pyelograms with intraoperative interpretation of fluoroscopic imaging  Surgeon: Lonni Han, MD  Assistants:  None  Anesthesia:  General  Complications:  None  EBL: 500 mL of clot was evacuated from the bladder lumen  Specimens: 1.  Urine specimen was sent for culture and sensitivity  Drains/Catheters: 1.  Bilateral 6 French, 24 cm JJ stents without tethers 2.  20 French three-way Foley catheter with 10 mL of sterile water  in the balloon  Intraoperative findings:   Large clot burden within the bladder with no other intravesical or urethral abnormalities Left retrograde pyelogram revealed a filling defect within the proximal aspects of the left ureter, consistent with the obstructing stone seen on cross-sectional imaging.  There was uniform dilation of the left renal pelvis with no other filling defects Right retrograde pyelogram revealed no filling defects along the length of the right ureter, but multiple filling defects within the right renal pelvis/UPJ, consistent with the stone burden seen on cross-sectional imaging.  Indication:  Antonio Navarro is a 63 y.o. male with acute blood loss anemia from gross hematuria secondary to an obstructing 1 cm left UPJ stone.  He was also noted to have multiple right renal/UPJ calculi without hydronephrosis.  The patient and his family been consented for the above procedures, voiced understanding and wished to proceed.  Description of procedure:  After informed consent was obtained, the patient was brought to the operating room and general LMA anesthesia was administered. The patient was then placed in the dorsolithotomy position and prepped and draped  in the usual sterile fashion. A timeout was performed. A 21 French rigid cystoscope was then inserted into the urethral meatus and advanced into the bladder under direct vision. A complete bladder survey revealed a large luminal clot burden that was evacuated from the lumen of the bladder through the sheath of the cystoscope.  No other intravesical urethral abnormalities were seen.  No specific area of active bleeding was identified.  A 5 French ureteral catheter was then inserted into the left ureteral orifice and a retrograde pyelogram was obtained, with the findings listed above.  A Glidewire was then used to intubate the lumen of the ureteral catheter and was advanced up to the left renal pelvis, under fluoroscopic guidance.  The catheter was then removed, leaving the wire in place.  A 6 French, 24 cm JJ stent was then advanced over the wire and into the left collecting system, confirming placement via fluoroscopy.  A 5 French ureteral catheter was then inserted into the right ureteral orifice and a retrograde pyelogram was obtained, with the findings listed above.  A Glidewire was then used to intubate the lumen of the ureteral catheter and was advanced up to the right renal pelvis, under fluoroscopic guidance.  The catheter was then removed, leaving the wire in place.  A 6 French, 24 cm JJ stent was then advanced over the wire and into good position within the right collecting system, confirming placement via fluoroscopy.  The rigid cystoscope was then removed.  A 20 French three-way Foley catheter was then inserted into the bladder with return of light pink irrigant.  CBI was started.  The patient tolerated the procedure well and was transferred to the postanesthesia in  stable condition.  Plan: Continue CBI.  Empiric antibiotics per primary team.  Hold aspirin and Plavix if possible.  Plan for definitive stone treatment in the next 2 to 3 weeks

## 2024-04-02 NOTE — ED Provider Notes (Signed)
 [place] EMERGENCY DEPARTMENT AT [facility] Provider Note   CSN: [id number] Arrival date & time: 04/02/24  1302     Patient presents with: No chief complaint on file.   Antonio Navarro is a 63 y.o. male.   The history is provided by the patient and medical records. No language interpreter was used.  Altered Mental Status Presenting symptoms comment:  Somnolence Severity:  Moderate Episode history:  Unable to specify Timing:  Constant Progression:  Unable to specify Associated symptoms: no abdominal pain, no agitation, no fever, no headaches, no nausea, no rash and no vomiting        Prior to Admission medications   Not on File    Allergies: Patient has no allergy information on record.    Review of Systems  Unable to perform ROS: Patient nonverbal  Constitutional:  Positive for fatigue. Negative for chills and fever.  HENT:  Negative for congestion.   Respiratory:  Positive for cough. Negative for shortness of breath.   Cardiovascular:  Negative for chest pain.  Gastrointestinal:  Negative for abdominal pain, constipation, diarrhea, nausea and vomiting.  Genitourinary:  Positive for decreased urine volume (dARKER) and hematuria.  Musculoskeletal:  Negative for back pain.  Skin:  Negative for rash and wound.  Neurological:  Negative for headaches.  Psychiatric/Behavioral:  Negative for agitation.   All other systems reviewed and are negative.   Updated Vital Signs BP 106/86   Pulse 95   Temp 98.8 F (37.1 C) (Oral)   Resp 19   Ht 5' 9 (1.753 m)   Wt 54.4 kg   SpO2 100%   BMI 17.72 kg/m   Physical Exam Vitals and nursing note reviewed.  Constitutional:      General: He is not in acute distress.    Appearance: He is well-developed. He is not ill-appearing, toxic-appearing or diaphoretic.  HENT:     Head: Normocephalic and atraumatic.     Nose: No congestion or rhinorrhea.     Mouth/Throat:     Mouth: Mucous membranes are dry.     Pharynx:  No oropharyngeal exudate or posterior oropharyngeal erythema.  Eyes:     Extraocular Movements: Extraocular movements intact.     Conjunctiva/sclera: Conjunctivae normal.     Pupils: Pupils are equal, round, and reactive to light.  Cardiovascular:     Rate and Rhythm: Normal rate and regular rhythm.     Pulses: Normal pulses.     Heart sounds: No murmur heard. Pulmonary:     Effort: Pulmonary effort is normal. No respiratory distress.     Breath sounds: Normal breath sounds. No wheezing, rhonchi or rales.  Chest:     Chest wall: No tenderness.  Abdominal:     General: Abdomen is flat.     Palpations: Abdomen is soft.     Tenderness: There is no abdominal tenderness. There is no guarding or rebound.  Musculoskeletal:        General: No swelling or tenderness.     Cervical back: Neck supple. No tenderness.  Skin:    General: Skin is warm and dry.     Capillary Refill: Capillary refill takes less than 2 seconds.     Findings: No erythema or rash.  Neurological:     Mental Status: He is alert.     (all labs ordered are listed, but only abnormal results are displayed) Labs Reviewed  CULTURE, BLOOD (ROUTINE X 2)  CULTURE, BLOOD (ROUTINE X 2)  RESP PANEL BY RT-PCR (  RSV, FLU A&B, COVID)  [id number]  CBC WITH DIFFERENTIAL/PLATELET  COMPREHENSIVE METABOLIC PANEL WITH GFR  URINALYSIS, W/ REFLEX TO CULTURE (INFECTION SUSPECTED)  TSH  AMMONIA  I-STAT CG4 LACTIC ACID, ED  I-STAT CG4 LACTIC ACID, ED    EKG: EKG Interpretation Date/Time:  Friday April 02 2024 13:21:44 EDT Ventricular Rate:  86 PR Interval:  136 QRS Duration:  78 QT Interval:  363 QTC Calculation: 435 R Axis:   -50  Text Interpretation: Sinus rhythm Ventricular premature complex Left anterior fascicular block Borderline low voltage, extremity leads Abnormal R-wave progression, early transition Lead(s) V1 were not used for morphology analysis no prior ECG for comparison No STEMI Confirmed by Ginger Barefoot ([id number]) on  04/02/2024 1:34:59 PM  Radiology: [facility] Chest Portable 1 View Result Date: 04/02/2024 CLINICAL DATA:  EMS concern for infection.  Some cough. EXAM: PORTABLE CHEST 1 VIEW COMPARISON:  None Available. FINDINGS: Low lung volume. Bilateral lung fields are clear. Bilateral costophrenic angles are clear. Normal cardio-mediastinal silhouette. No acute osseous abnormalities. The soft tissues are within normal limits. IMPRESSION: No active disease. Electronically Signed   By: Ree Molt M.D.   On: 04/02/2024 13:51     Procedures   Medications Ordered in the ED - No data to display                                  Medical Decision Making Amount and/or Complexity of Data Reviewed Labs: ordered. Radiology: ordered.    Antonio Navarro is a 63 y.o. male with unknown past medical history who is reportedly nonverbal at baseline who presents with family concern for UTI and altered mental status.  According to EMS, patient has had dark and foul-smelling urine that had some blood in it recently and is acting more fatigued and somnolent.  He reportedly is nonverbal at baseline but is using more interactive.  Patient has been sleeping.  There was no reported trauma and patient is able to wake up and seems to nod yes or no to some answers.  He is denying chest pain or headache to me.  Denying abdominal pain.  He did say yes to some cough and EMS reported he had some phlegm.  He did not have bowel changes reportedly.  EMS said vital signs were overall reassuring and he was afebrile.  On my exam, patient has weakness in extremities but was able to squeeze his right arm.  Pupils are symmetric and reactive and he was nodding yes and no to some answers.  Lungs were relatively clear and chest was nontender.  Abdomen nontender.  Patient has some chronic contractures in extremities.  Given his report of altered mental status fatigue and family concern for UTI, we will get workup to look for occult infection.  Will get  urinalysis, labs, and will get chest x-ray as well.  Will get a CT head given his altered mental status and inability to tell us  if he is having other complaints  With the altered mental status.  Anticipate reassessment after workup to determine disposition.  3:15 PM Care transferred oncoming team to wait for workup results and reassessment.  Given reassuring vital signs, patient is well-appearing and workup reassuring, anticipate possible discharge home     Final diagnoses:  Fatigue, unspecified type  Somnolence  Altered mental status, unspecified altered mental status type  Foul smelling urine     Clinical Impression: 1. Fatigue, unspecified type  2. Somnolence   3. Altered mental status, unspecified altered mental status type   4. Foul smelling urine     Disposition: Care transferred oncoming team to wait for workup results and reassessment.  Given reassuring vital signs, patient is well-appearing and workup reassuring, anticipate possible discharge home  This note was prepared with assistance of Dragon voice recognition software. Occasional wrong-word or sound-a-like substitutions may have occurred due to the inherent limitations of voice recognition software.      Mccartney Brucks, Lonni PARAS, MD 04/02/24 1515

## 2024-04-02 NOTE — Consult Note (Addendum)
 Urology Consult   Physician requesting consult: Glendia Breeding, MD   Reason for consult: Hematuria and bilateral kidney stones  History of Present Illness: Antonio Navarro is a 63 y.o. male who is nonverbal following a TBI who presents to the [facility] [address] emergency department with altered mental status for the past 72 hours.  He has no family at bedside, but I spoke with his significant other and the remainder of his history is obtained from the chart.  His CT stone study from today shows an obstructing 1 cm left proximal ureteral stone as well as multiple right renal stones as well as clot material filling the left ureter and bladder.   PMHx: TBI and kidney stones PSHx: Unable to be obtained  Current Hospital Medications:  Home Meds:  Current Meds  Medication Sig   aspirin EC 81 MG tablet Take 81 mg by mouth daily. Swallow whole.   atorvastatin  (LIPITOR ) 80 MG tablet Take 80 mg by mouth daily.   clopidogrel (PLAVIX) 75 MG tablet Take 75 mg by mouth daily.   enzalutamide  (XTANDI ) 40 MG tablet Take 160 mg by mouth daily.   losartan (COZAAR) 25 MG tablet Take 25 mg by mouth daily.   metoprolol  tartrate (LOPRESSOR ) 25 MG tablet Take 12.5 mg by mouth 2 (two) times daily.   polyethylene glycol (MIRALAX / GLYCOLAX) 17 g packet Take 17 g by mouth daily.    Scheduled Meds:  sodium chloride    Intravenous Once   Continuous Infusions:  cefTRIAXone  (ROCEPHIN )  IV     PRN Meds:.  Allergies: No Known Allergies  No family history on file.  Social History:  has no history on file for tobacco use, alcohol use, and drug use.  ROS: A complete review of systems was performed.  All systems are negative except for pertinent findings as noted.  Physical Exam:  Vital signs in last 24 hours: Temp:  [98.7 F (37.1 C)-98.8 F (37.1 C)] 98.7 F (37.1 C) (08/15 1704) Pulse Rate:  [87-99] 99 (08/15 1645) Resp:  [16-22] 16 (08/15 1645) BP: (103-106)/(78-86) 103/78 (08/15 1645) SpO2:  [98 %-100 %] 100  % (08/15 1645) Weight:  [54.4 kg] 54.4 kg (08/15 1317) Constitutional:  Response only to painful stimuli  Laboratory Data:  Recent Labs    04/02/24 1326  WBC 12.7*  HGB 7.3*  HCT 24.1*  PLT 207    Recent Labs    04/02/24 1326  NA 139  K 4.7  CL 105  GLUCOSE 120*  BUN 26*  CALCIUM  9.0  CREATININE 2.16*     Results for orders placed or performed during the hospital encounter of 04/02/24 (from the past 24 hours)  CBC with Differential     Status: Abnormal   Collection Time: 04/02/24  1:26 PM  Result Value Ref Range   WBC 12.7 (H) 4.0 - 10.5 K/uL   RBC 2.45 (L) 4.22 - 5.81 MIL/uL   Hemoglobin 7.3 (L) 13.0 - 17.0 g/dL   HCT 75.8 (L) 60.9 - 47.9 %   MCV 98.4 80.0 - 100.0 fL   MCH 29.8 26.0 - 34.0 pg   MCHC 30.3 30.0 - 36.0 g/dL   RDW 85.8 88.4 - 84.4 %   Platelets 207 150 - 400 K/uL   nRBC 0.0 0.0 - 0.2 %   Neutrophils Relative % 84 %   Neutro Abs 10.8 (H) 1.7 - 7.7 K/uL   Lymphocytes Relative 10 %   Lymphs Abs 1.2 0.7 - 4.0 K/uL   Monocytes Relative  4 %   Monocytes Absolute 0.5 0.1 - 1.0 K/uL   Eosinophils Relative 0 %   Eosinophils Absolute 0.0 0.0 - 0.5 K/uL   Basophils Relative 1 %   Basophils Absolute 0.1 0.0 - 0.1 K/uL   Immature Granulocytes 1 %   Abs Immature Granulocytes 0.08 (H) 0.00 - 0.07 K/uL  Comprehensive metabolic panel     Status: Abnormal   Collection Time: 04/02/24  1:26 PM  Result Value Ref Range   Sodium 139 135 - 145 mmol/L   Potassium 4.7 3.5 - 5.1 mmol/L   Chloride 105 98 - 111 mmol/L   CO2 26 22 - 32 mmol/L   Glucose, Bld 120 (H) 70 - 99 mg/dL   BUN 26 (H) 8 - 23 mg/dL   Creatinine, Ser 7.83 (H) 0.61 - 1.24 mg/dL   Calcium  9.0 8.9 - 10.3 mg/dL   Total Protein 8.3 (H) 6.5 - 8.1 g/dL   Albumin 2.7 (L) 3.5 - 5.0 g/dL   AST 15 15 - 41 U/L   ALT 8 0 - 44 U/L   Alkaline Phosphatase 48 38 - 126 U/L   Total Bilirubin 0.7 0.0 - 1.2 mg/dL   GFR, Estimated 34 (L) >60 mL/min   Anion gap 8 5 - 15  Ammonia     Status: None   Collection  Time: 04/02/24  1:26 PM  Result Value Ref Range   Ammonia 13 9 - 35 umol/L  Resp panel by RT-PCR (RSV, Flu A&B, Covid) Anterior Nasal Swab     Status: None   Collection Time: 04/02/24  1:27 PM   Specimen: Anterior Nasal Swab  Result Value Ref Range   SARS Coronavirus 2 by RT PCR NEGATIVE NEGATIVE   Influenza A by PCR NEGATIVE NEGATIVE   Influenza B by PCR NEGATIVE NEGATIVE   Resp Syncytial Virus by PCR NEGATIVE NEGATIVE  I-Stat CG4 Lactic Acid     Status: None   Collection Time: 04/02/24  3:17 PM  Result Value Ref Range   Lactic Acid, Venous 1.8 0.5 - 1.9 mmol/L  Type and screen [facility]     Status: None   Collection Time: 04/02/24  4:21 PM  Result Value Ref Range   ABO/RH(D) O POS    Antibody Screen NEG    Sample Expiration      04/05/2024,2359 Performed at [facility], [address], [place], [place] [address]   ABO/Rh     Status: None   Collection Time: 04/02/24  4:27 PM  Result Value Ref Range   ABO/RH(D)      O POS Performed at [facility], [address] [place], [place] [address]   POC occult blood, ED     Status: None   Collection Time: 04/02/24  4:55 PM  Result Value Ref Range   Fecal Occult Bld NEGATIVE NEGATIVE  Urinalysis, w/ Reflex to Culture (Infection Suspected) -Urine, Clean Catch     Status: Abnormal   Collection Time: 04/02/24  5:30 PM  Result Value Ref Range   Specimen Source URINE, CLEAN CATCH    Color, Urine RED (A) YELLOW   APPearance TURBID (A) CLEAR   Specific Gravity, Urine  1.005 - 1.030    TEST NOT REPORTED DUE TO COLOR INTERFERENCE OF URINE PIGMENT   pH  5.0 - 8.0    TEST NOT REPORTED DUE TO COLOR INTERFERENCE OF URINE PIGMENT   Glucose, UA (A) NEGATIVE mg/dL    TEST NOT REPORTED  DUE TO COLOR INTERFERENCE OF URINE PIGMENT   Hgb urine dipstick (A) NEGATIVE    TEST NOT REPORTED DUE TO COLOR INTERFERENCE OF URINE PIGMENT   Bilirubin Urine (A) NEGATIVE    TEST NOT REPORTED DUE TO COLOR INTERFERENCE OF URINE  PIGMENT   Ketones, ur (A) NEGATIVE mg/dL    TEST NOT REPORTED DUE TO COLOR INTERFERENCE OF URINE PIGMENT   Protein, ur (A) NEGATIVE mg/dL    TEST NOT REPORTED DUE TO COLOR INTERFERENCE OF URINE PIGMENT   Nitrite (A) NEGATIVE    TEST NOT REPORTED DUE TO COLOR INTERFERENCE OF URINE PIGMENT   Leukocytes,Ua (A) NEGATIVE    TEST NOT REPORTED DUE TO COLOR INTERFERENCE OF URINE PIGMENT   Squamous Epithelial / HPF 0-5 0 - 5 /HPF   WBC, UA 6-10 0 - 5 WBC/hpf   RBC / HPF >50 0 - 5 RBC/hpf   Bacteria, UA FEW (A) NONE SEEN   Recent Results (from the past 240 hours)  Resp panel by RT-PCR (RSV, Flu A&B, Covid) Anterior Nasal Swab     Status: None   Collection Time: 04/02/24  1:27 PM   Specimen: Anterior Nasal Swab  Result Value Ref Range Status   SARS Coronavirus 2 by RT PCR NEGATIVE NEGATIVE Final   Influenza A by PCR NEGATIVE NEGATIVE Final   Influenza B by PCR NEGATIVE NEGATIVE Final    Comment: (NOTE) The Xpert Xpress SARS-CoV-2/FLU/RSV plus assay is intended as an aid in the diagnosis of influenza from Nasopharyngeal swab specimens and should not be used as a sole basis for treatment. Nasal washings and aspirates are unacceptable for Xpert Xpress SARS-CoV-2/FLU/RSV testing.  Fact Sheet for Patients: BloggerCourse.com  Fact Sheet for Healthcare Providers: SeriousBroker.it  This test is not yet approved or cleared by the [place]  FDA and has been authorized for detection and/or diagnosis of SARS-CoV-2 by FDA under an Emergency Use Authorization (EUA). This EUA will remain in effect (meaning this test can be used) for the duration of the COVID-19 declaration under Section 564(b)(1) of the Act, 21 U.S.C. section 360bbb-3(b)(1), unless the authorization is terminated or revoked.     Resp Syncytial Virus by PCR NEGATIVE NEGATIVE Final    Comment: (NOTE) Fact Sheet for Patients: BloggerCourse.com  Fact  Sheet for Healthcare Providers: SeriousBroker.it  This test is not yet approved or cleared by the [place]  FDA and has been authorized for detection and/or diagnosis of SARS-CoV-2 by FDA under an Emergency Use Authorization (EUA). This EUA will remain in effect (meaning this test can be used) for the duration of the COVID-19 declaration under Section 564(b)(1) of the Act, 21 U.S.C. section 360bbb-3(b)(1), unless the authorization is terminated or revoked.  Performed at [facility], [address] [place], [place] [address]     Renal Function: Recent Labs    04/02/24 1326  CREATININE 2.16*   Estimated Creatinine Clearance: 27.6 mL/min (A) (by C-G formula based on SCr of 2.16 mg/dL (H)).  Radiologic Imaging: CT Renal Stone Study Result Date: 04/02/2024 CLINICAL DATA:  Abdominal flank pain.  Stone suspected EXAM: CT ABDOMEN AND PELVIS WITHOUT CONTRAST TECHNIQUE: Multidetector CT imaging of the abdomen and pelvis was performed following the standard protocol without IV contrast. RADIATION DOSE REDUCTION: This exam was performed according to the departmental dose-optimization program which includes automated exposure control, adjustment of the mA and/or kV according to patient size and/or use of iterative reconstruction technique. COMPARISON:  PET-CT 04/03/2023 FINDINGS: Lower chest: Lung bases are clear.  Hepatobiliary: No focal hepatic lesion. Normal gallbladder. No biliary duct dilatation. Common bile duct is normal. Pancreas: Pancreas is normal. No ductal dilatation. No pancreatic inflammation. Spleen: Normal spleen Adrenals/urinary tract: Adrenal glands normal. There is hydronephrosis and hydroureter of the LEFT collecting system. There is a large obstructing calculus in the mid LEFT ureter measuring 9 mm (image 46/series 3) there is high-density material within the ureter and renal pelvis proximal to the obstructing calculus which could indicate  hemorrhage. Additional nonobstructing calculi in the LEFT kidney measuring 11 mm. Multiple calculi in the RIGHT kidney and RIGHT renal pelvis. No evidence obstruction. No RIGHT ureterolithiasis. No bladder calculi. There is high-density material within the lumen of the bladder nearly filling the entire bladder. Findings concerning for clot within the bladder versus less likely bladder neoplasm. Stomach/Bowel: The stomach, duodenum, and small bowel normal. The colon and rectosigmoid colon are normal. Vascular/Lymphatic: Abdominal aorta is normal caliber with atherosclerotic calcification. There is no retroperitoneal or periportal lymphadenopathy. No pelvic lymphadenopathy. Reproductive: Prostate unremarkable fiducial markers within the prostate gland. Other: Fluid within the LEFT inguinal canal.  No change from prior. Musculoskeletal: No aggressive osseous lesion. IMPRESSION: 1. Large obstructing calculus in the mid LEFT ureter. LEFT hydroureter. 2. High-density material in the LEFT renal pelvis and LEFT ureter proximal to the obstructing calculus. Findings concerning for hemorrhage within the LEFT renal pelvis and ureter. 3. High-density material within the lumen of the bladder concerning for clot versus less likely bladder neoplasm. 4. Bilateral nephrolithiasis. 5.  Aortic Atherosclerosis (ICD10-I70.0). Electronically Signed   By: Jackquline Boxer M.D.   On: 04/02/2024 19:25   CT Head Wo Contrast Result Date: 04/02/2024 CLINICAL DATA:  Lethargy. EXAM: CT HEAD WITHOUT CONTRAST TECHNIQUE: Contiguous axial images were obtained from the base of the skull through the vertex without intravenous contrast. RADIATION DOSE REDUCTION: This exam was performed according to the departmental dose-optimization program which includes automated exposure control, adjustment of the mA and/or kV according to patient size and/or use of iterative reconstruction technique. COMPARISON:  April 06, 2021 FINDINGS: Brain: There is  generalized cerebral atrophy with widening of the extra-axial spaces and ventricular dilatation. There are areas of decreased attenuation within the white matter tracts of the supratentorial brain, consistent with microvascular disease changes. A chronic right occipital lobe infarct is seen. A chronic right basal ganglia lacunar infarct is noted. Vascular: Marked severity bilateral cavernous carotid artery calcification is noted. Skull: Normal. Negative for fracture or focal lesion. Sinuses/Orbits: No acute finding. Other: None. IMPRESSION: 1. Generalized cerebral atrophy with chronic white matter small vessel ischemic changes. 2. Chronic right occipital lobe infarct. 3. Chronic right basal ganglia lacunar infarct. 4. No acute intracranial abnormality. Electronically Signed   By: Suzen Dials M.D.   On: 04/02/2024 16:40   DG Chest Portable 1 View Result Date: 04/02/2024 CLINICAL DATA:  EMS concern for infection.  Some cough. EXAM: PORTABLE CHEST 1 VIEW COMPARISON:  None Available. FINDINGS: Low lung volume. Bilateral lung fields are clear. Bilateral costophrenic angles are clear. Normal cardio-mediastinal silhouette. No acute osseous abnormalities. The soft tissues are within normal limits. IMPRESSION: No active disease. Electronically Signed   By: Ree Molt M.D.   On: 04/02/2024 13:51    I independently reviewed the above imaging studies.  Impression/Recommendation 63 year old male evaluated in the emergency department with gross hematuria with acute blood loss anemia secondary to an obstructing left proximal ureteral stone.  He also has multiple right renal stones with 1 stone in) many to the right UPJ.  -  The risks, benefits and alternatives of cystoscopy with clot evacuation and bilateral JJ stent placement was discussed with the patient significant other Lenis Lemmings) risks include, but are not limited to: bleeding, urinary tract infection, ureteral injury, ureteral stricture disease,  chronic pain, urinary symptoms, bladder injury, stent migration, the need for nephrostomy tube placement, MI, CVA, DVT, PE and the inherent risks with general anesthesia.  The patient's significant other voices understanding and wishes to proceed emergently to the operating room.    Lonni Han, MD [facility] 04/02/2024, 8:15 PM

## 2024-04-02 NOTE — ED Notes (Signed)
 LOIS Lemmings and A. Adams called for pt update per family request. No answer.

## 2024-04-02 NOTE — ED Notes (Signed)
 Providers bedside speaking with pt and family.

## 2024-04-02 NOTE — ED Provider Notes (Signed)
 Care transferred to me.  Patient's hemoglobin is noted to be 7.3.  With the sister in the room I did an occult blood test but he has no obvious hemorrhoids or masses and has light brown stool.  However he has clear clots coming from his penis and the sister states that he has been having about a week of hematuria and then today has been having clots.  No fevers.  Hemodynamically stable.  Had discussion about blood transfusion, ultimately family/significant other are ok with getting it if needed.  Ultimately, workup shows an acute kidney injury and gross hematuria.  CT shows significant ureteral stone and possible hemorrhage in the ureter.  Discussed with Dr. Devere of urology and he will take to the OR.  Will give a unit of blood given his low hemoglobin and active bleeding.  He otherwise has some tachycardia but is stable.  No obvious indication of an infection.  Discussed with the internal medicine teaching service who will admit.  CRITICAL CARE Performed by: Glendia ONEIDA Breeding   Total critical care time: 30 minutes  Critical care time was exclusive of separately billable procedures and treating other patients.  Critical care was necessary to treat or prevent imminent or life-threatening deterioration.  Critical care was time spent personally by me on the following activities: development of treatment plan with patient and/or surrogate as well as nursing, discussions with consultants, evaluation of patient's response to treatment, examination of patient, obtaining history from patient or surrogate, ordering and performing treatments and interventions, ordering and review of laboratory studies, ordering and review of radiographic studies, pulse oximetry and re-evaluation of patient's condition.    Breeding Glendia, MD 04/02/24 2140

## 2024-04-02 NOTE — Anesthesia Preprocedure Evaluation (Signed)
 Anesthesia Evaluation  Patient identified by MRN, date of birth, ID band Patient confused    Reviewed: Allergy & Precautions, NPO status , Patient's Chart, lab work & pertinent test resultsPreop documentation limited or incomplete due to emergent nature of procedure.  Airway Mallampati: II  TM Distance: >3 FB     Dental   Pulmonary neg pulmonary ROS   Pulmonary exam normal        Cardiovascular negative cardio ROS Normal cardiovascular exam     Neuro/Psych TBI    GI/Hepatic negative GI ROS, Neg liver ROS,,,  Endo/Other  negative endocrine ROS    Renal/GU ARFRenal diseaseBilateral renal stones with hematuria     Musculoskeletal   Abdominal   Peds  Hematology  (+) Blood dyscrasia, anemia   Anesthesia Other Findings   Reproductive/Obstetrics                              Anesthesia Physical Anesthesia Plan  ASA: 4 and emergent  Anesthesia Plan: General   Post-op Pain Management:    Induction: Intravenous  PONV Risk Score and Plan: 2 and Dexamethasone and Ondansetron   Airway Management Planned: LMA  Additional Equipment:   Intra-op Plan:   Post-operative Plan: Possible Post-op intubation/ventilation  Informed Consent: I have reviewed the patients History and Physical, chart, labs and discussed the procedure including the risks, benefits and alternatives for the proposed anesthesia with the patient or authorized representative who has indicated his/her understanding and acceptance.     Consent reviewed with POA  Plan Discussed with:   Anesthesia Plan Comments:         Anesthesia Quick Evaluation

## 2024-04-02 NOTE — Anesthesia Procedure Notes (Signed)
 Procedure Name: LMA Insertion Date/Time: 04/02/2024 10:16 PM  Performed by: Jemery Stacey T, CRNAPre-anesthesia Checklist: Patient identified, Emergency Drugs available, Suction available and Patient being monitored Patient Re-evaluated:Patient Re-evaluated prior to induction Oxygen Delivery Method: Circle system utilized Preoxygenation: Pre-oxygenation with 100% oxygen Induction Type: IV induction Ventilation: Mask ventilation without difficulty LMA: LMA inserted LMA Size: 5.0 Number of attempts: 1 Placement Confirmation: positive ETCO2 and breath sounds checked- equal and bilateral Tube secured with: Tape Dental Injury: Teeth and Oropharynx as per pre-operative assessment

## 2024-04-02 NOTE — ED Triage Notes (Signed)
 Pt presents via [facility] with CC of lethargy over the past week - family states they think he has a UTI bc of foul smelling urine and hematuria.   The pt is nonverbal at baseline but follows commands.   There may be some R sided leg pain as the pt winces when it is touched.

## 2024-04-03 DIAGNOSIS — I255 Ischemic cardiomyopathy: Secondary | ICD-10-CM | POA: Diagnosis present

## 2024-04-03 DIAGNOSIS — C61 Malignant neoplasm of prostate: Secondary | ICD-10-CM | POA: Diagnosis present

## 2024-04-03 DIAGNOSIS — N200 Calculus of kidney: Secondary | ICD-10-CM

## 2024-04-03 DIAGNOSIS — N202 Calculus of kidney with calculus of ureter: Secondary | ICD-10-CM | POA: Diagnosis present

## 2024-04-03 DIAGNOSIS — R7881 Bacteremia: Secondary | ICD-10-CM | POA: Diagnosis present

## 2024-04-03 DIAGNOSIS — Z515 Encounter for palliative care: Secondary | ICD-10-CM | POA: Diagnosis not present

## 2024-04-03 DIAGNOSIS — I5032 Chronic diastolic (congestive) heart failure: Secondary | ICD-10-CM

## 2024-04-03 DIAGNOSIS — Z79899 Other long term (current) drug therapy: Secondary | ICD-10-CM | POA: Diagnosis not present

## 2024-04-03 DIAGNOSIS — R1310 Dysphagia, unspecified: Secondary | ICD-10-CM | POA: Diagnosis present

## 2024-04-03 DIAGNOSIS — Z7982 Long term (current) use of aspirin: Secondary | ICD-10-CM | POA: Diagnosis not present

## 2024-04-03 DIAGNOSIS — Z960 Presence of urogenital implants: Secondary | ICD-10-CM | POA: Diagnosis not present

## 2024-04-03 DIAGNOSIS — B964 Proteus (mirabilis) (morganii) as the cause of diseases classified elsewhere: Secondary | ICD-10-CM | POA: Diagnosis present

## 2024-04-03 DIAGNOSIS — I110 Hypertensive heart disease with heart failure: Secondary | ICD-10-CM | POA: Diagnosis present

## 2024-04-03 DIAGNOSIS — N390 Urinary tract infection, site not specified: Secondary | ICD-10-CM | POA: Diagnosis present

## 2024-04-03 DIAGNOSIS — D500 Iron deficiency anemia secondary to blood loss (chronic): Secondary | ICD-10-CM

## 2024-04-03 DIAGNOSIS — E785 Hyperlipidemia, unspecified: Secondary | ICD-10-CM | POA: Diagnosis present

## 2024-04-03 DIAGNOSIS — N179 Acute kidney failure, unspecified: Secondary | ICD-10-CM | POA: Diagnosis present

## 2024-04-03 DIAGNOSIS — K131 Cheek and lip biting: Secondary | ICD-10-CM

## 2024-04-03 DIAGNOSIS — G931 Anoxic brain damage, not elsewhere classified: Secondary | ICD-10-CM | POA: Diagnosis present

## 2024-04-03 DIAGNOSIS — E43 Unspecified severe protein-calorie malnutrition: Secondary | ICD-10-CM | POA: Diagnosis present

## 2024-04-03 DIAGNOSIS — I2510 Atherosclerotic heart disease of native coronary artery without angina pectoris: Secondary | ICD-10-CM | POA: Diagnosis present

## 2024-04-03 DIAGNOSIS — D649 Anemia, unspecified: Secondary | ICD-10-CM | POA: Diagnosis not present

## 2024-04-03 DIAGNOSIS — R32 Unspecified urinary incontinence: Secondary | ICD-10-CM | POA: Diagnosis present

## 2024-04-03 DIAGNOSIS — Z6820 Body mass index (BMI) 20.0-20.9, adult: Secondary | ICD-10-CM | POA: Diagnosis not present

## 2024-04-03 DIAGNOSIS — Z7189 Other specified counseling: Secondary | ICD-10-CM | POA: Diagnosis not present

## 2024-04-03 DIAGNOSIS — N209 Urinary calculus, unspecified: Secondary | ICD-10-CM | POA: Diagnosis present

## 2024-04-03 DIAGNOSIS — Z66 Do not resuscitate: Secondary | ICD-10-CM | POA: Diagnosis present

## 2024-04-03 DIAGNOSIS — D6489 Other specified anemias: Secondary | ICD-10-CM | POA: Diagnosis not present

## 2024-04-03 DIAGNOSIS — R64 Cachexia: Secondary | ICD-10-CM | POA: Diagnosis present

## 2024-04-03 DIAGNOSIS — D62 Acute posthemorrhagic anemia: Secondary | ICD-10-CM | POA: Diagnosis present

## 2024-04-03 DIAGNOSIS — G9349 Other encephalopathy: Secondary | ICD-10-CM | POA: Diagnosis present

## 2024-04-03 DIAGNOSIS — Z7902 Long term (current) use of antithrombotics/antiplatelets: Secondary | ICD-10-CM | POA: Diagnosis not present

## 2024-04-03 LAB — BLOOD CULTURE ID PANEL (REFLEXED) - BCID2

## 2024-04-03 LAB — CBC WITH DIFFERENTIAL/PLATELET
Abs Immature Granulocytes: 0.16 K/uL — ABNORMAL HIGH (ref 0.00–0.07)
Basophils Absolute: 0.1 K/uL (ref 0.0–0.1)
Basophils Relative: 0 %
Eosinophils Absolute: 0 K/uL (ref 0.0–0.5)
Eosinophils Relative: 0 %
HCT: 26.7 % — ABNORMAL LOW (ref 39.0–52.0)
Hemoglobin: 8.4 g/dL — ABNORMAL LOW (ref 13.0–17.0)
Immature Granulocytes: 1 %
Lymphocytes Relative: 4 %
Lymphs Abs: 0.8 K/uL (ref 0.7–4.0)
MCH: 30.5 pg (ref 26.0–34.0)
MCHC: 31.5 g/dL (ref 30.0–36.0)
MCV: 97.1 fL (ref 80.0–100.0)
Monocytes Absolute: 0.8 K/uL (ref 0.1–1.0)
Monocytes Relative: 4 %
Neutro Abs: 18 K/uL — ABNORMAL HIGH (ref 1.7–7.7)
Neutrophils Relative %: 91 %
Platelets: 162 K/uL (ref 150–400)
RBC: 2.75 MIL/uL — ABNORMAL LOW (ref 4.22–5.81)
RDW: 14.5 % (ref 11.5–15.5)
WBC: 19.7 K/uL — ABNORMAL HIGH (ref 4.0–10.5)
nRBC: 0 % (ref 0.0–0.2)

## 2024-04-03 LAB — HIV ANTIBODY (ROUTINE TESTING W REFLEX): HIV Screen 4th Generation wRfx: NONREACTIVE

## 2024-04-03 LAB — COMPREHENSIVE METABOLIC PANEL WITH GFR
ALT: 9 U/L (ref 0–44)
AST: 14 U/L — ABNORMAL LOW (ref 15–41)
Albumin: 2.4 g/dL — ABNORMAL LOW (ref 3.5–5.0)
Alkaline Phosphatase: 43 U/L (ref 38–126)
Anion gap: 9 (ref 5–15)
BUN: 25 mg/dL — ABNORMAL HIGH (ref 8–23)
CO2: 22 mmol/L (ref 22–32)
Calcium: 8.5 mg/dL — ABNORMAL LOW (ref 8.9–10.3)
Chloride: 108 mmol/L (ref 98–111)
Creatinine, Ser: 2.13 mg/dL — ABNORMAL HIGH (ref 0.61–1.24)
GFR, Estimated: 35 mL/min — ABNORMAL LOW (ref >60)
Glucose, Bld: 139 mg/dL — ABNORMAL HIGH (ref 70–99)
Potassium: 4.8 mmol/L (ref 3.5–5.1)
Sodium: 139 mmol/L (ref 135–145)
Total Bilirubin: 0.6 mg/dL (ref 0.0–1.2)
Total Protein: 7.3 g/dL (ref 6.5–8.1)

## 2024-04-03 LAB — MAGNESIUM: Magnesium: 1.8 mg/dL (ref 1.7–2.4)

## 2024-04-03 LAB — PROTIME-INR
INR: 1.3 — ABNORMAL HIGH (ref 0.8–1.2)
Prothrombin Time: 16.6 s — ABNORMAL HIGH (ref 11.4–15.2)

## 2024-04-03 LAB — TSH: TSH: 4.621 u[IU]/mL — ABNORMAL HIGH (ref 0.350–4.500)

## 2024-04-03 MED ORDER — STERILE WATER FOR IRRIGATION IR SOLN
Status: DC | PRN
Start: 2024-04-02 — End: 2024-04-03
  Administered 2024-04-02: 3000 mL

## 2024-04-03 MED ORDER — CHLORHEXIDINE GLUCONATE CLOTH 2 % EX PADS
6.0000 | MEDICATED_PAD | Freq: Every day | CUTANEOUS | Status: DC
  Administered 2024-04-03 – 2024-04-06 (×4): 6 via TOPICAL

## 2024-04-03 MED ORDER — SODIUM CHLORIDE 0.9 % IR SOLN
Status: AC | PRN
  Administered 2024-04-02: 6000 mL/h

## 2024-04-03 MED ORDER — LACTATED RINGERS IV BOLUS
1000.0000 mL | Freq: Once | INTRAVENOUS | Status: AC
  Administered 2024-04-03: 1000 mL via INTRAVENOUS

## 2024-04-03 MED ORDER — SODIUM CHLORIDE 0.9 % IR SOLN
3000.0000 mL | Status: DC
  Administered 2024-04-03 – 2024-04-04 (×9): 3000 mL

## 2024-04-03 MED ORDER — ENZALUTAMIDE 40 MG PO TABS: 40.0000 mg | ORAL_TABLET | Freq: Every day | ORAL | Status: DC

## 2024-04-03 MED ORDER — ATORVASTATIN CALCIUM 80 MG PO TABS
80.0000 mg | ORAL_TABLET | Freq: Every day | ORAL | Status: DC
  Administered 2024-04-03 – 2024-04-08 (×6): 80 mg via ORAL
  Filled 2024-04-03 (×6): qty 1

## 2024-04-03 MED ORDER — SODIUM CHLORIDE 0.9 % IV SOLN: 2.0000 g | INTRAVENOUS | Status: DC

## 2024-04-03 MED ORDER — METOPROLOL TARTRATE 12.5 MG HALF TABLET
12.5000 mg | ORAL_TABLET | Freq: Two times a day (BID) | ORAL | Status: DC
  Administered 2024-04-03 – 2024-04-08 (×11): 12.5 mg via ORAL
  Filled 2024-04-03 (×11): qty 1

## 2024-04-03 MED ORDER — SODIUM CHLORIDE 0.9 % IV SOLN
2.0000 g | INTRAVENOUS | Status: DC
  Administered 2024-04-03 – 2024-04-04 (×2): 2 g via INTRAVENOUS
  Filled 2024-04-03 (×2): qty 20

## 2024-04-03 NOTE — Progress Notes (Signed)
 1 Day Post-Op Subjective: Pt resting comfortably.  No family at bedside. H/H stable  Objective: Vital signs in last 24 hours: Temp:  [98 F (36.7 C)-98.8 F (37.1 C)] 98 F (36.7 C) (08/16 0716) Pulse Rate:  [87-117] 94 (08/16 0716) Resp:  [11-24] 18 (08/16 0716) BP: (103-164)/(73-96) 119/82 (08/16 0716) SpO2:  [96 %-100 %] 100 % (08/16 0716) Weight:  [54.4 kg-56.2 kg] 56.2 kg (08/16 0500)  Intake/Output from previous day: 08/15 0701 - 08/16 0700 In: 89014 [P.O.:20; I.V.:650; Blood:315; IV Piggyback:1000] Out: 27160 [Urine:27150; Blood:10]  Intake/Output this shift: Total I/O In: -  Out: 3600 [Urine:3600]  Physical Exam:  GU:  20 F 3-way in place and draining light pink urine w/o clots.  Catheter bag markedly full from CBI.   Lab Results: Recent Labs    04/02/24 1326 04/03/24 0348  HGB 7.3* 8.4*  HCT 24.1* 26.7*   BMET Recent Labs    04/02/24 1326 04/03/24 0348  NA 139 139  K 4.7 4.8  CL 105 108  CO2 26 22  GLUCOSE 120* 139*  BUN 26* 25*  CREATININE 2.16* 2.13*  CALCIUM  9.0 8.5*     Studies/Results: CT Renal Stone Study Result Date: 04/02/2024 CLINICAL DATA:  Abdominal flank pain.  Stone suspected EXAM: CT ABDOMEN AND PELVIS WITHOUT CONTRAST TECHNIQUE: Multidetector CT imaging of the abdomen and pelvis was performed following the standard protocol without IV contrast. RADIATION DOSE REDUCTION: This exam was performed according to the departmental dose-optimization program which includes automated exposure control, adjustment of the mA and/or kV according to patient size and/or use of iterative reconstruction technique. COMPARISON:  PET-CT 04/03/2023 FINDINGS: Lower chest: Lung bases are clear. Hepatobiliary: No focal hepatic lesion. Normal gallbladder. No biliary duct dilatation. Common bile duct is normal. Pancreas: Pancreas is normal. No ductal dilatation. No pancreatic inflammation. Spleen: Normal spleen Adrenals/urinary tract: Adrenal glands normal. There is  hydronephrosis and hydroureter of the LEFT collecting system. There is a large obstructing calculus in the mid LEFT ureter measuring 9 mm (image 46/series 3) there is high-density material within the ureter and renal pelvis proximal to the obstructing calculus which could indicate hemorrhage. Additional nonobstructing calculi in the LEFT kidney measuring 11 mm. Multiple calculi in the RIGHT kidney and RIGHT renal pelvis. No evidence obstruction. No RIGHT ureterolithiasis. No bladder calculi. There is high-density material within the lumen of the bladder nearly filling the entire bladder. Findings concerning for clot within the bladder versus less likely bladder neoplasm. Stomach/Bowel: The stomach, duodenum, and small bowel normal. The colon and rectosigmoid colon are normal. Vascular/Lymphatic: Abdominal aorta is normal caliber with atherosclerotic calcification. There is no retroperitoneal or periportal lymphadenopathy. No pelvic lymphadenopathy. Reproductive: Prostate unremarkable fiducial markers within the prostate gland. Other: Fluid within the LEFT inguinal canal.  No change from prior. Musculoskeletal: No aggressive osseous lesion. IMPRESSION: 1. Large obstructing calculus in the mid LEFT ureter. LEFT hydroureter. 2. High-density material in the LEFT renal pelvis and LEFT ureter proximal to the obstructing calculus. Findings concerning for hemorrhage within the LEFT renal pelvis and ureter. 3. High-density material within the lumen of the bladder concerning for clot versus less likely bladder neoplasm. 4. Bilateral nephrolithiasis. 5.  Aortic Atherosclerosis (ICD10-I70.0). Electronically Signed   By: Jackquline Boxer M.D.   On: 04/02/2024 19:25   CT Head Wo Contrast Result Date: 04/02/2024 CLINICAL DATA:  Lethargy. EXAM: CT HEAD WITHOUT CONTRAST TECHNIQUE: Contiguous axial images were obtained from the base of the skull through the vertex without intravenous contrast. RADIATION DOSE  REDUCTION: This exam  was performed according to the departmental dose-optimization program which includes automated exposure control, adjustment of the mA and/or kV according to patient size and/or use of iterative reconstruction technique. COMPARISON:  April 06, 2021 FINDINGS: Brain: There is generalized cerebral atrophy with widening of the extra-axial spaces and ventricular dilatation. There are areas of decreased attenuation within the white matter tracts of the supratentorial brain, consistent with microvascular disease changes. A chronic right occipital lobe infarct is seen. A chronic right basal ganglia lacunar infarct is noted. Vascular: Marked severity bilateral cavernous carotid artery calcification is noted. Skull: Normal. Negative for fracture or focal lesion. Sinuses/Orbits: No acute finding. Other: None. IMPRESSION: 1. Generalized cerebral atrophy with chronic white matter small vessel ischemic changes. 2. Chronic right occipital lobe infarct. 3. Chronic right basal ganglia lacunar infarct. 4. No acute intracranial abnormality. Electronically Signed   By: Suzen Dials M.D.   On: 04/02/2024 16:40   DG Chest Portable 1 View Result Date: 04/02/2024 CLINICAL DATA:  EMS concern for infection.  Some cough. EXAM: PORTABLE CHEST 1 VIEW COMPARISON:  None Available. FINDINGS: Low lung volume. Bilateral lung fields are clear. Bilateral costophrenic angles are clear. Normal cardio-mediastinal silhouette. No acute osseous abnormalities. The soft tissues are within normal limits. IMPRESSION: No active disease. Electronically Signed   By: Ree Molt M.D.   On: 04/02/2024 13:51    Assessment/Plan: 63 year old male with grade 5 prostate cancer, anoxic brain injury, gross hematuria and bilateral ureteral and renal stone.  POD1 s/p cystoscopy with clot evac and bilateral ureteral stents  -Hematuria improving.  H/h stable following PRBC transfusion.  Keep Foley in place with CBI.  Hand irrigate catheter PRN worsening  hematuria and/or obstruction.  Recommend holding aspirin and plavix until hematuria completely resolves. -Urine culture pending.  No scheduled antibiotics ordered by primary team.  He received 2 g of Rocephin  last night for surgical prophylaxis. -Will need definitive stone treatment as an OP -Will continue to monitor   LOS: 0 days   Lonni Han, MD [facility] Pager: [phone]  04/03/2024, 9:23 AM

## 2024-04-03 NOTE — Progress Notes (Signed)
 Orthopedic Tech Progress Note Patient Details:  Antonio Navarro February 21, 1961 [id number]  Order for bilateral resting hand splints called into [facility]. Via secure chat, Curtistine, OT clarified the pt is needing resting hand splints vs wrist braces and also requested it to be a stat order.  Patient ID: Antonio Navarro, male   DOB: September 02, 1960, 63 y.o.   MRN: [id number]  Antonio Navarro 04/03/2024, 4:47 PM

## 2024-04-03 NOTE — Evaluation (Addendum)
 Clinical/Bedside Swallow Evaluation Patient Details  Name: Antonio Navarro MRN: [id number] Date of Birth: 06-27-1961  Today's Date: 04/03/2024 Time: SLP Start Time (ACUTE ONLY): 1119 SLP Stop Time (ACUTE ONLY): 1134 SLP Time Calculation (min) (ACUTE ONLY): 15 min  Past Medical History: History reviewed. No pertinent past medical history. Past Surgical History: History reviewed. No pertinent surgical history. HPI:  Antonio Navarro is a 63 y.o. 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 He is accompanied by his partner Antonio Navarro in person and his sister Antonio Navarro via phone who provide collateral. They have noticed patient having increased fatigue and not acting himself for the last week and half. He is usually able to be communicated with via yes or no questions, but they have had increased difficulty communicating with him in this time. He has not eaten normally in 3-4 days and has not eaten more than small bites of food intermittently. He has not been orally hydrating as normal. ST consulted for swallow evaluation.    Assessment / Plan / Recommendation  Clinical Impression  Pt seen for clinical swallow evaluation with fiance present and A with meal administration.  No oral care provided as pt was consuming lunch tray when SLP arrived.  Pt exhibited a cognitive-based dysphagia c/b prolonged oral preparation, impaired mastication, oral holding prior to swallow and decreased sustained attention.  Pt consumes a mechanical soft/soft diet at home per fiance's report.  No overt s/s of aspiration present during trial of lunch tray with Dysphagia 1/thin liquids, but pt is at min risk d/t cognitive-based dysphagia.  Pt with prolonged mastication and oral holding with peaches, but  this consistency is more realistic to foods he consumes at home per report.  Recommend initiate Dysphagia 3(mechanical soft)/thin liquids with general swallow precautions and FULL supervision/A with meals provided for small bites/sips, slow rate, checking for buccal retention (pocketing), and insuring swallow is initiated prior to next bite/sip. ST will f/u briefly for diet tolerance/education during acute stay.  Thank you for this consult. SLP Visit Diagnosis: Dysphagia, unspecified (R13.10)    Aspiration Risk  Mild aspiration risk    Diet Recommendation   Thin;Dysphagia 3 (mechanical soft)  Medication Administration: Other (Comment) (crushed/puree and/or small pills provided with liquids with small sips at nursing discretion d/t cognitive impairment)    Other  Recommendations Oral Care Recommendations: Oral care BID;Staff/trained caregiver to provide oral care     Assistance Recommended at Discharge  FULL  Functional Status Assessment Patient has had a recent decline in their functional status and demonstrates the ability to make significant improvements in function in a reasonable and predictable amount of time.  Frequency and Duration min 1 x/week  1 week       Prognosis Prognosis for improved oropharyngeal function: Good Barriers to Reach Goals: Cognitive deficits      Swallow Study   General Date of Onset: 04/02/24 HPI: Antonio Navarro is a 63 y.o. 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 He is accompanied by his partner Antonio Navarro in person and his sister Antonio Navarro via phone who provide collateral. They have noticed patient having  increased fatigue and not acting himself for the last week and half. He is usually able to be communicated with via yes  or no questions, but they have had increased difficulty communicating with him in this time. He has not eaten normally in 3-4 days and has not eaten more than small bites of food intermittently. He has not been orally hydrating as normal. ST consulted for swallow evaluation. Type of Study: Bedside Swallow Evaluation Previous Swallow Assessment: n/a Diet Prior to this Study: Dysphagia 1 (pureed);Thin liquids (Level 0) Temperature Spikes Noted: No Respiratory Status: Room air History of Recent Intubation: No Behavior/Cognition: Alert;Requires cueing Oral Cavity Assessment: Within Functional Limits Oral Care Completed by SLP: No (n/a; pt eating lunch tray when SLP arrived) Oral Cavity - Dentition: Missing dentition Self-Feeding Abilities: Total assist Patient Positioning: Upright in bed Baseline Vocal Quality: Low vocal intensity;Other (comment) (dysarthric) Volitional Cough: Other (Comment) (not observed) Volitional Swallow: Able to elicit    Oral/Motor/Sensory Function Overall Oral Motor/Sensory Function: Generalized oral weakness   Ice Chips Ice chips: Not tested   Thin Liquid Thin Liquid: Impaired Presentation: Straw Oral Phase Functional Implications: Oral holding    Nectar Thick Nectar Thick Liquid: Not tested   Honey Thick Honey Thick Liquid: Not tested   Puree Puree: Impaired Presentation: Spoon Oral Phase Impairments: Impaired mastication Oral Phase Functional Implications: Prolonged oral transit;Oral holding   Solid     Solid: Impaired Presentation: Spoon Oral Phase Impairments: Reduced lingual movement/coordination;Impaired mastication Oral Phase Functional Implications: Oral holding;Prolonged oral transit;Impaired mastication      Antonio Navarro,M.S.,CCC-SLP 04/03/2024,12:49 PM

## 2024-04-03 NOTE — Evaluation (Signed)
 Occupational Therapy Evaluation Patient Details Name: Antonio Navarro MRN: [id number] DOB: 15-Oct-1960 Today's Date: 04/03/2024   History of Present Illness   Pt is a 63 y.o. male who presented 04/02/24 with family concern for UTI and AMS. Pt admitted with gross hematuria with acute blood loss anemia secondary to an obstructing left proximal ureteral stone. Imaging also suggestive of hemorrhage within the left renal pelvis and ureter and clot in the bladder. S/p cystoscopy with bil ureteral stent placement and clot evacuation 8/15. 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 Upon OT assessment, pt noted to have bilat wrist/hand contractures and family reports they do not have any splints, placed an order. Other than L hand dry hands no other skin abnormalities noted. Pt is close to baseline, total A for ADLs. OT will continue following pt acutely for UE ROM and assess splint tolerance and provide education to family as able. Patient would benefit from post acute Home OT services to help maximize functional independence in natural environment if family decides it will be beneficial at any point.      If plan is discharge home, recommend the following:   Two people to help with walking and/or transfers;Two people to help with bathing/dressing/bathroom;Assistance with cooking/housework;Assistance with feeding;Supervision due to cognitive status;Assist for transportation;Help with stairs or ramp for entrance;Direct supervision/assist for medications management     Functional Status Assessment   Patient has had a recent decline in their functional status and/or  demonstrates limited ability to make significant improvements in function in a reasonable and predictable amount of time     Equipment Recommendations   Other (comment);Hoyer lift (Ordered bilat resting hand splints; ramp)     Recommendations for Other Services         Precautions/Restrictions   Precautions Precautions: Fall Recall of Precautions/Restrictions: Impaired Precaution/Restrictions Comments: watch HR; minimally verbal at baseline Restrictions Weight Bearing Restrictions Per Provider Order: No     Mobility Bed Mobility               General bed mobility comments: deferred during OT session, anticipate total to max A    Transfers                          Balance   Sitting-balance support: Feet supported                                       ADL either performed or assessed with clinical judgement   ADL                                         General ADL Comments: Pt is total A for all ADLs, that is also his baseline per his mother.     Vision   Alignment/Gaze Preference: Chin down;Head tilt (L head tilt) Additional Comments: Difficult to fully assess.     Perception Perception: Not tested       Praxis Praxis: Not tested  Pertinent Vitals/Pain Pain Assessment Pain Assessment: PAINAD Breathing: normal Negative Vocalization: none Facial Expression: smiling or inexpressive Body Language: rigid, fists clenched, knees up, pushing/pulling away, strikes out Consolability: no need to console PAINAD Score: 2 Pain Intervention(s): Monitored during session (observed with wrist PROM)     Extremity/Trunk Assessment Upper Extremity Assessment Upper Extremity Assessment: Defer to OT evaluation RUE Deficits / Details: slight wrist contractures with digits stiff predominantly in extenstion. PROM of elbow and shoulder WFL. very light squeeze. LUE Deficits / Details: PROM of elbow WFL, L shouder  stiff beyond 80 deg PROM wrist contracted but able to do PROM to neutral. very light squeeze which seemed more like thumb adduction. pull back when extending wrist further past neutral.   Lower Extremity Assessment Lower Extremity Assessment: Generalized weakness;RLE deficits/detail;LLE deficits/detail RLE Deficits / Details: WFL hip PROM and knee flexion/extension ROM; ankle dorsiflexion ~10' PROM LLE Deficits / Details: WFL hip PROM and knee flexion/extension ROM; ankle dorsiflexion ~10' PROM   Cervical / Trunk Assessment Cervical / Trunk Assessment: Kyphotic   Communication Communication Communication: Impaired Factors Affecting Communication: Difficulty expressing self (minimally verbal at baseline, significant other reports he can state his name intermittently)   Cognition Arousal: Lethargic Behavior During Therapy: WFL for tasks assessed/performed Cognition: No family/caregiver present to determine baseline, Difficult to assess Difficult to assess due to: Impaired communication           OT - Cognition Comments: opens eyes to name, non verbal, was able to smile at therapists once. Limited command following capacity.                 Following commands: Impaired Following commands impaired: Follows one step commands inconsistently, Follows one step commands with increased time     Cueing  General Comments   Cueing Techniques: Verbal cues;Gestural cues;Tactile cues  Pt L hand noted to be very dry and dirty, cleaned and lotioned bilat hands. Ordered bilat resting hand splints to help with contractures.   Exercises     Shoulder Instructions      Home Living Family/patient expects to be discharged to:: Private residence Living Arrangements: Spouse/significant other;Children (x2 adult sons) Available Help at Discharge: Family;Available 24 hours/day;Personal care attendant (PCA comes x9 hours/day M-F and Sundays x3 hours) Type of Home: House Home Access: Stairs to  enter [employer] of Steps: 4 Entrance Stairs-Rails: Right;Left Home Layout: One level     Bathroom Shower/Tub: Sponge bathes at baseline   [employer]:  (uses depends due to incontinence)     Home Equipment: Wheelchair - manual;Hospital bed          Prior Functioning/Environment Prior Level of Function : Needs assist             Mobility Comments: x3 assist on stairs, ambulates up to ~50 ft to access car with +2 HHA, +2 for transfers, needs assist with all functional mobility, unable to propel w/c ADLs Comments: TA for pericare, bathing, dressing, feeding; able to verbalize his name sometimes    OT Problem List: Decreased strength;Decreased range of motion;Decreased cognition;Impaired balance (sitting and/or standing);Impaired UE functional use;Decreased coordination   OT Treatment/Interventions: Self-care/ADL training;Therapeutic exercise;Patient/family education;Balance training      OT Goals(Current goals can be found in the care plan section)   Acute Rehab OT Goals Patient Stated Goal: none stated OT Goal Formulation: Patient unable to participate in goal setting Time For Goal Achievement: 04/17/24 Potential to Achieve Goals: Fair   OT Frequency:  Min 1X/week    Co-evaluation  AM-PAC OT 6 Clicks Daily Activity     Outcome Measure Help from another person eating meals?: Total Help from another person taking care of personal grooming?: Total Help from another person toileting, which includes using toliet, bedpan, or urinal?: Total Help from another person bathing (including washing, rinsing, drying)?: Total Help from another person to put on and taking off regular upper body clothing?: Total Help from another person to put on and taking off regular lower body clothing?: Total 6 Click Score: 6   End of Session Nurse Communication: Need for lift equipment;Mobility status (+2, pivot. hoyer appropriate)  Activity Tolerance:  Patient tolerated treatment well Patient left: in bed;with call bell/phone within reach;with bed alarm set  OT Visit Diagnosis: Unsteadiness on feet (R26.81);Other abnormalities of gait and mobility (R26.89);Other symptoms and signs involving cognitive function                Time: 8674-8657 OT Time Calculation (min): 17 min Charges:  OT General Charges $OT Visit: 1 Visit OT Evaluation $OT Eval Moderate Complexity: 1 Mod  04/03/2024  AB, OTR/L  Acute Rehabilitation Services  Office: [phone]   Curtistine JONETTA Das 04/03/2024, 5:17 PM

## 2024-04-03 NOTE — TOC CM/SW Note (Signed)
 Per PT, pt's wife is asking about getting more hours for the [facility] aide. Met with pt and sister. Informed sister that they can contact the Medicaid SW and she can assist with the [facility] aide. She reports that she has the SW # and she will call her.

## 2024-04-03 NOTE — Evaluation (Addendum)
 Physical Therapy Evaluation Patient Details Name: Antonio Navarro MRN: [id number] DOB: [birth year] Today's Date: 04/03/2024  History of Present Illness  Pt is a 63 y.o. male who presented 04/02/24 with family concern for UTI and AMS. Pt admitted with gross hematuria with acute blood loss anemia secondary to an obstructing left proximal ureteral stone. Imaging also suggestive of hemorrhage within the left renal pelvis and ureter and clot in the bladder. S/p cystoscopy with bil ureteral stent placement and clot evacuation 8/15. 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 PTA, he was living with his significant other and x2 adult sons in a 1-level house with 4 STE. At baseline, the pt requires assistance for all functional mobility, often needing +2 assist for transfers and ambulating up to ~50 ft with bil HHA to access the car and +3 assist with bil HHA to navigate stairs. Currently, the pt is requiring maxA for bed mobility, transfers, and taking x1 step along EOB with bil HHA. He appears to be not far from his baseline per report from his wife. He demonstrates deficits in bil hand ROM, generalized strength, cognition, communication, balance, endurance, and power. He is at high risk for falls. Pt handed off to OT at end of session and PT spent the remainder of the session on the phone with pt's wife to discuss home set-up, DME needs, and therapy recs. He may be able to benefit from HHPT to try to improve his independence and safety with mobility and reduce his caregiver burden. Will continue to follow acutely.       If plan is discharge home, recommend the following: Two people to help with walking and/or  transfers;A lot of help with bathing/dressing/bathroom;Assistance with cooking/housework;Assistance with feeding;Direct supervision/assist for medications management;Direct supervision/assist for financial management;Assist for transportation;Help with stairs or ramp for entrance;Supervision due to cognitive status   Can travel by private vehicle        Equipment Recommendations [place] lift;Other (comment) (ramp for home access)  Recommendations for Other Services       Functional Status Assessment Patient has had a recent decline in their functional status and demonstrates the ability to make significant improvements in function in a reasonable and predictable amount of time.     Precautions / Restrictions Precautions Precautions: Fall Recall of Precautions/Restrictions: Impaired Precaution/Restrictions Comments: watch HR; minimally verbal at baseline Restrictions Weight Bearing Restrictions Per Provider Order: No      Mobility  Bed Mobility Overal bed mobility: Needs Assistance Bed Mobility: Supine to Sit, Sit to Supine     Supine to sit: Max assist, HOB elevated Sit to supine: Max assist   General bed mobility comments: Max repeated cues providing for pt to bring legs off R EOB, which pt did actively bring legs proximal but unable to get off EOB without maxA. MaxA needed at trunk to ascend and sit up as well. Cued pt to lean trunk to R towards HOB to return to supine with pt needing maxA to lift legs onto bed and control trunk.    Transfers Overall transfer level: Needs assistance Equipment used: 1 person hand held assist Transfers: Sit to/from Stand, Bed to chair/wheelchair/BSC Sit to Stand: Max assist   Step pivot transfers: Max assist  General transfer comment: Bil knees blocked with pt being cued to extend legs and hips to stand up from EOB, needing maxA to extend hips and trunk, power up to stand, and gain balance. MaxA needed for balance, shifting weight to L  foot, and to advance R foot to step laterally to R and step pivot towards HOB.    Ambulation/Gait Ambulation/Gait assistance: Max assist Gait Distance (Feet): 1 Feet Assistive device: 1 person hand held assist Gait Pattern/deviations: Step-to pattern, Decreased step length - right, Decreased step length - left, Decreased stride length, Knee flexed in stance - right, Knee flexed in stance - left, Decreased weight shift to left, Decreased weight shift to right, Trunk flexed, Leaning posteriorly Gait velocity: reduced Gait velocity interpretation: <1.31 ft/sec, indicative of household ambulator   General Gait Details: Pt leans posteriorly and maintains a flexed trunk and bil knees posture, needing maxA and bil knee block to extend legs and trunk and maintain balance. MaxA needed to shift weight to L leg and to advance R foot to step pivot to R along EOB.  Stairs            Wheelchair Mobility     Tilt Bed    Modified Rankin (Stroke Patients Only) Modified Rankin (Stroke Patients Only) Pre-Morbid Rankin Score: Moderately severe disability Modified Rankin: Severe disability     Balance Overall balance assessment: Needs assistance Sitting-balance support: Single extremity supported, Bilateral upper extremity supported, Feet supported Sitting balance-Leahy Scale: Poor Sitting balance - Comments: Pt leans to the L, varying in needing CGA-modA for static sitting balance. Postural control: Posterior lean Standing balance support: Bilateral upper extremity supported Standing balance-Leahy Scale: Poor Standing balance comment: MaxA and bil knee block required to stand, posterior lean noted with flexed posture                             Pertinent Vitals/Pain Pain Assessment Pain Assessment: No/denies pain (pt denied pain, even when he appeared to be potentially moaning and grimacing)    Home Living Family/patient expects to be discharged to:: Private residence Living  Arrangements: Spouse/significant other;Children (x2 adult sons) Available Help at Discharge: Family;Available 24 hours/day;Personal care attendant (PCA comes x9 hours/day M-F and Sundays x3 hours) Type of Home: House Home Access: Stairs to enter Entrance Stairs-Rails: Doctor, general practice of Steps: 4   Home Layout: One level Home Equipment: Wheelchair - manual;Hospital bed      Prior Function Prior Level of Function : Needs assist             Mobility Comments: x3 assist on stairs, ambulates up to ~50 ft to access car with +2 HHA, +2 for transfers, needs assist with all functional mobility, unable to propel w/c ADLs Comments: TA for pericare, bathing, dressing, feeding; able to verbalize his name sometimes     Extremity/Trunk Assessment   Upper Extremity Assessment Upper Extremity Assessment: Defer to OT evaluation RUE Deficits / Details: slight wrist contractures with digits stiff predominantly in extenstion. PROM of elbow and shoulder WFL. very light squeeze. LUE Deficits / Details: PROM of elbow WFL, L shouder stiff beyond 80 deg PROM wrist contracted but able to do PROM to neutral. very light squeeze which seemed more like thumb adduction. pull back when extending wrist further past neutral.    Lower Extremity Assessment Lower Extremity Assessment: Generalized weakness;RLE deficits/detail;LLE deficits/detail RLE Deficits / Details: WFL hip PROM and knee flexion/extension ROM; ankle dorsiflexion ~10' PROM LLE  Deficits / Details: WFL hip PROM and knee flexion/extension ROM; ankle dorsiflexion ~10' PROM    Cervical / Trunk Assessment Cervical / Trunk Assessment: Kyphotic  Communication   Communication Communication: Impaired Factors Affecting Communication: Difficulty expressing self (minimally verbal at baseline, significant other reports he can state his name intermittently)    Cognition Arousal: Lethargic Behavior During Therapy: WFL for tasks  assessed/performed   PT - Cognitive impairments: No family/caregiver present to determine baseline                       PT - Cognition Comments: Pt asleep upon arrival and initially difficult to arouse then suddenly pt woke up. He fell back to sleep a couple times during session but then was easy to arouse. He would answer yes/no questions via head shakes/nods inconsistently, sometimes needed questions repeated or his name called prior to it to gain his attention. Delayed processing and initiation with mobility. Following commands: Impaired Following commands impaired: Follows one step commands inconsistently, Follows one step commands with increased time     Cueing Cueing Techniques: Verbal cues, Gestural cues, Tactile cues     General Comments General comments (skin integrity, edema, etc.): HR up to 137 bpm, recovered with seated rest and cues for breathing    Exercises     Assessment/Plan    PT Assessment Patient needs continued PT services  PT Problem List Decreased strength;Decreased activity tolerance;Decreased mobility;Decreased balance;Decreased cognition;Cardiopulmonary status limiting activity       PT Treatment Interventions DME instruction;Gait training;Stair training;Functional mobility training;Therapeutic activities;Therapeutic exercise;Balance training;Cognitive remediation;Neuromuscular re-education;Patient/family education;Wheelchair mobility training    PT Goals (Current goals can be found in the Care Plan section)  Acute Rehab PT Goals Patient Stated Goal: did not state; wife desires for whatever is best for pt PT Goal Formulation: With patient/family Time For Goal Achievement: 04/17/24 Potential to Achieve Goals: Fair    Frequency Min 2X/week     Co-evaluation               AM-PAC PT 6 Clicks Mobility  Outcome Measure Help needed turning from your back to your side while in a flat bed without using bedrails?: A Lot Help needed moving  from lying on your back to sitting on the side of a flat bed without using bedrails?: A Lot Help needed moving to and from a bed to a chair (including a wheelchair)?: A Lot Help needed standing up from a chair using your arms (e.g., wheelchair or bedside chair)?: A Lot Help needed to walk in hospital room?: Total Help needed climbing 3-5 steps with a railing? : Total 6 Click Score: 10    End of Session Equipment Utilized During Treatment: Gait belt;Oxygen Activity Tolerance: Patient limited by fatigue Patient left: in bed;with call bell/phone within reach;with bed alarm set Nurse Communication: Mobility status PT Visit Diagnosis: Unsteadiness on feet (R26.81);Other abnormalities of gait and mobility (R26.89);Muscle weakness (generalized) (M62.81);Difficulty in walking, not elsewhere classified (R26.2)    Time: 8693-8650 PT Time Calculation (min) (ACUTE ONLY): 43 min   Charges:   PT Evaluation $PT Eval Moderate Complexity: 1 Mod PT Treatments $Therapeutic Activity: 23-37 mins PT General Charges $$ ACUTE PT VISIT: 1 Visit         Theo Ferretti, PT, DPT Acute Rehabilitation Services  Office: [phone]   Theo CHRISTELLA Ferretti 04/03/2024, 4:53 PM

## 2024-04-03 NOTE — Progress Notes (Signed)
 PHARMACY - PHYSICIAN COMMUNICATION CRITICAL VALUE ALERT - BLOOD CULTURE IDENTIFICATION (BCID)  Antonio Navarro is an 63 y.o. male who presented to [facility] on 04/02/2024 with a chief complaint of blood in urine and fatigue.   Assessment:  BCID positive for proteus no resistance in 2/4 anaerobic bottles. Suspected urinary source.   Name of physician (or Provider) Contacted: Mliss Pouch, [place]  Current antibiotics: ceftriaxone   Changes to prescribed antibiotics recommended:  Patient is on recommended antibiotics - No changes needed  Results for orders placed or performed during the hospital encounter of 04/02/24  Blood Culture ID Panel (Reflexed) (Collected: 04/02/2024  6:48 PM)  Result Value Ref Range   Enterococcus faecalis NOT DETECTED NOT DETECTED   Enterococcus Faecium NOT DETECTED NOT DETECTED   Listeria monocytogenes NOT DETECTED NOT DETECTED   Staphylococcus species NOT DETECTED NOT DETECTED   Staphylococcus aureus (BCID) NOT DETECTED NOT DETECTED   Staphylococcus epidermidis NOT DETECTED NOT DETECTED   Staphylococcus lugdunensis NOT DETECTED NOT DETECTED   Streptococcus species NOT DETECTED NOT DETECTED   Streptococcus agalactiae NOT DETECTED NOT DETECTED   Streptococcus pneumoniae NOT DETECTED NOT DETECTED   Streptococcus pyogenes NOT DETECTED NOT DETECTED   A.calcoaceticus-baumannii NOT DETECTED NOT DETECTED   Bacteroides fragilis NOT DETECTED NOT DETECTED   Enterobacterales DETECTED (A) NOT DETECTED   Enterobacter cloacae complex NOT DETECTED NOT DETECTED   Escherichia coli NOT DETECTED NOT DETECTED   Klebsiella aerogenes NOT DETECTED NOT DETECTED   Klebsiella oxytoca NOT DETECTED NOT DETECTED   Klebsiella pneumoniae NOT DETECTED NOT DETECTED   Proteus species DETECTED (A) NOT DETECTED   Salmonella species NOT DETECTED NOT DETECTED   Serratia marcescens NOT DETECTED NOT DETECTED   Haemophilus influenzae NOT DETECTED NOT DETECTED   Neisseria meningitidis NOT  DETECTED NOT DETECTED   Pseudomonas aeruginosa NOT DETECTED NOT DETECTED   Stenotrophomonas maltophilia NOT DETECTED NOT DETECTED   Candida albicans NOT DETECTED NOT DETECTED   Candida auris NOT DETECTED NOT DETECTED   Candida glabrata NOT DETECTED NOT DETECTED   Candida krusei NOT DETECTED NOT DETECTED   Candida parapsilosis NOT DETECTED NOT DETECTED   Candida tropicalis NOT DETECTED NOT DETECTED   Cryptococcus neoformans/gattii NOT DETECTED NOT DETECTED   CTX-M ESBL NOT DETECTED NOT DETECTED   Carbapenem resistance IMP NOT DETECTED NOT DETECTED   Carbapenem resistance KPC NOT DETECTED NOT DETECTED   Carbapenem resistance NDM NOT DETECTED NOT DETECTED   Carbapenem resist OXA 48 LIKE NOT DETECTED NOT DETECTED   Carbapenem resistance VIM NOT DETECTED NOT DETECTED    Antonio Navarro M Antonio Navarro 04/03/2024  10:23 AM

## 2024-04-03 NOTE — Progress Notes (Addendum)
 HD#0 SUBJECTIVE:  Patient Summary: Antonio Navarro is a 63 y.o. 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   Overnight Events: Patient was seen by urology overnight for a cystoscopy with bilateral ureteral stent placement and clot evacuation.  He tolerated this well, and a significant clot burden was removed from the bladder.  Interim History: Patient evaluated at bedside with his fiance.  Patient is nonverbal at baseline.  He is expressive and can answer yes and no questions with his expressions.  When asked, patient agrees his pain has improved since admission.  Fiance at bedside to help answer questions regarding his usual baseline and functional status since admission.  She states that he looks improved prior from the last few days, but his appetite is still decreased.  OBJECTIVE:  Vital Signs: Vitals:   04/03/24 0500 04/03/24 0716 04/03/24 1134 04/03/24 1158  BP: 113/76 119/82 100/79   Pulse: 93 94 (!) 108 100  Resp: 12 18 20 16   Temp:  98 F (36.7 C) 98.1 F (36.7 C)   TempSrc:  Oral Oral   SpO2: 100% 100% 100% 100%  Weight: 56.2 kg     Height:       Supplemental O2: Nasal Cannula SpO2: 100 % O2 Flow Rate (L/min): 2 L/min  Filed Weights   04/02/24 1317 04/03/24 0500  Weight: 54.4 kg 56.2 kg     Intake/Output Summary (Last 24 hours) at 04/03/2024 1217 Last data filed at 04/03/2024 0849 Gross per 24 hour  Intake 89014 ml  Output 30760 ml  Net -19775 ml   Net IO Since Admission: -19,775 mL [04/03/24 1217]  Physical Exam: Physical Exam Constitutional:      Appearance: He is ill-appearing.     Comments: Cachectic appearing  HENT:     Mouth/Throat:     Mouth:  Mucous membranes are dry.     Comments: Dried blood noted on patient's lips and tongue.  This seems to be a chronic issue from tongue and lip biting, and not signs of hemoptysis. Cardiovascular:     Rate and Rhythm: Regular rhythm. Tachycardia present.  Pulmonary:     Effort: No respiratory distress.  Abdominal:     General: Abdomen is flat. Bowel sounds are normal. There is no distension.     Palpations: Abdomen is soft.  Skin:    General: Skin is warm and dry.  Neurological:     Mental Status: Mental status is at baseline.  Psychiatric:        Mood and Affect: Mood normal.        Behavior: Behavior normal.     Patient Lines/Drains/Airways Status     Active Line/Drains/Airways     Name Placement date Placement time Site Days   Peripheral IV 04/02/24 20 G 2.5 Left Antecubital 04/02/24  1449  Antecubital  1   Urethral Catheter Dr. Devere Latex 20 Fr. 04/02/24  2250  Latex  1   Ureteral Drain/Stent Right ureter 6 Fr. 04/02/24  2238  Right ureter  1   Ureteral Drain/Stent Left ureter 6 Fr. 04/02/24  2245  Left ureter  1            Pertinent labs and imaging:     Latest Ref Rng & Units 04/03/2024  3:48 AM 04/02/2024    1:26 PM  CBC  WBC 4.0 - 10.5 K/uL 19.7  12.7   Hemoglobin 13.0 - 17.0 g/dL 8.4  7.3   Hematocrit 60.9 - 52.0 % 26.7  24.1   Platelets 150 - 400 K/uL 162  207        Latest Ref Rng & Units 04/03/2024    3:48 AM 04/02/2024    1:26 PM  CMP  Glucose 70 - 99 mg/dL 860  879   BUN 8 - 23 mg/dL 25  26   Creatinine 9.38 - 1.24 mg/dL 7.86  7.83   Sodium 864 - 145 mmol/L 139  139   Potassium 3.5 - 5.1 mmol/L 4.8  4.7   Chloride 98 - 111 mmol/L 108  105   CO2 22 - 32 mmol/L 22  26   Calcium  8.9 - 10.3 mg/dL 8.5  9.0   Total Protein 6.5 - 8.1 g/dL 7.3  8.3   Total Bilirubin 0.0 - 1.2 mg/dL 0.6  0.7   Alkaline Phos 38 - 126 U/L 43  48   AST 15 - 41 U/L 14  15   ALT 0 - 44 U/L 9  8     CT Renal Stone Study Result Date: 04/02/2024 CLINICAL DATA:   Abdominal flank pain.  Stone suspected EXAM: CT ABDOMEN AND PELVIS WITHOUT CONTRAST TECHNIQUE: Multidetector CT imaging of the abdomen and pelvis was performed following the standard protocol without IV contrast. RADIATION DOSE REDUCTION: This exam was performed according to the departmental dose-optimization program which includes automated exposure control, adjustment of the mA and/or kV according to patient size and/or use of iterative reconstruction technique. COMPARISON:  PET-CT 04/03/2023 FINDINGS: Lower chest: Lung bases are clear. Hepatobiliary: No focal hepatic lesion. Normal gallbladder. No biliary duct dilatation. Common bile duct is normal. Pancreas: Pancreas is normal. No ductal dilatation. No pancreatic inflammation. Spleen: Normal spleen Adrenals/urinary tract: Adrenal glands normal. There is hydronephrosis and hydroureter of the LEFT collecting system. There is a large obstructing calculus in the mid LEFT ureter measuring 9 mm (image 46/series 3) there is high-density material within the ureter and renal pelvis proximal to the obstructing calculus which could indicate hemorrhage. Additional nonobstructing calculi in the LEFT kidney measuring 11 mm. Multiple calculi in the RIGHT kidney and RIGHT renal pelvis. No evidence obstruction. No RIGHT ureterolithiasis. No bladder calculi. There is high-density material within the lumen of the bladder nearly filling the entire bladder. Findings concerning for clot within the bladder versus less likely bladder neoplasm. Stomach/Bowel: The stomach, duodenum, and small bowel normal. The colon and rectosigmoid colon are normal. Vascular/Lymphatic: Abdominal aorta is normal caliber with atherosclerotic calcification. There is no retroperitoneal or periportal lymphadenopathy. No pelvic lymphadenopathy. Reproductive: Prostate unremarkable fiducial markers within the prostate gland. Other: Fluid within the LEFT inguinal canal.  No change from prior. Musculoskeletal: No  aggressive osseous lesion. IMPRESSION: 1. Large obstructing calculus in the mid LEFT ureter. LEFT hydroureter. 2. High-density material in the LEFT renal pelvis and LEFT ureter proximal to the obstructing calculus. Findings concerning for hemorrhage within the LEFT renal pelvis and ureter. 3. High-density material within the lumen of the bladder concerning for clot versus less likely bladder neoplasm. 4. Bilateral nephrolithiasis. 5.  Aortic Atherosclerosis (ICD10-I70.0). Electronically Signed   By: Jackquline Boxer M.D.   On: 04/02/2024 19:25   CT Head Wo Contrast Result Date: 04/02/2024 CLINICAL DATA:  Lethargy. EXAM: CT HEAD WITHOUT CONTRAST TECHNIQUE: Contiguous axial images were obtained from the base  of the skull through the vertex without intravenous contrast. RADIATION DOSE REDUCTION: This exam was performed according to the departmental dose-optimization program which includes automated exposure control, adjustment of the mA and/or kV according to patient size and/or use of iterative reconstruction technique. COMPARISON:  April 06, 2021 FINDINGS: Brain: There is generalized cerebral atrophy with widening of the extra-axial spaces and ventricular dilatation. There are areas of decreased attenuation within the white matter tracts of the supratentorial brain, consistent with microvascular disease changes. A chronic right occipital lobe infarct is seen. A chronic right basal ganglia lacunar infarct is noted. Vascular: Marked severity bilateral cavernous carotid artery calcification is noted. Skull: Normal. Negative for fracture or focal lesion. Sinuses/Orbits: No acute finding. Other: None. IMPRESSION: 1. Generalized cerebral atrophy with chronic white matter small vessel ischemic changes. 2. Chronic right occipital lobe infarct. 3. Chronic right basal ganglia lacunar infarct. 4. No acute intracranial abnormality. Electronically Signed   By: Suzen Dials M.D.   On: 04/02/2024 16:40   DG Chest  Portable 1 View Result Date: 04/02/2024 CLINICAL DATA:  EMS concern for infection.  Some cough. EXAM: PORTABLE CHEST 1 VIEW COMPARISON:  None Available. FINDINGS: Low lung volume. Bilateral lung fields are clear. Bilateral costophrenic angles are clear. Normal cardio-mediastinal silhouette. No acute osseous abnormalities. The soft tissues are within normal limits. IMPRESSION: No active disease. Electronically Signed   By: Ree Molt M.D.   On: 04/02/2024 13:51    ASSESSMENT/PLAN:  Assessment: Principal Problem:   Urolithiasis Active Problems:   Prostate cancer (HCC)   Cardiomyopathy, ischemic   History of anoxic brain injury   Incontinence of bowel   Incontinence of urine   CAD (coronary artery disease) of artery bypass graft   Dysphagia   History of kidney stones   UTI (urinary tract infection)   Requires assistance with all activities of daily living (ADL)   Cachexia (HCC)   Gross hematuria   Acute on chronic blood loss anemia   AKI (acute kidney injury) (HCC)   Nonverbal communication  Naol Ontiveros is a 63 y.o. 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  Plan: Hematuria Urolithiasis Bilateral nephrolithiasis Acute on chronic blood loss anemia Renal CT revealing of bilateral nephrolithiasis and urolithiasis of left ureter and suggestive of hemorrhage within the left renal pelvis and ureter, and clotting in the bladder. Urology performed cystoscopy with clot evacuation and bilateral JJ stent placement. 1 cm stone of left ureter was unable to be removed.  Urology recommends continued bladder irrigation and empiric antibiotics.  Blood cultures growing Proteus Mirabilis, and continuation of 2 g  ceftriaxone  for 7 days total was recommended to pharmacy.  Urology recommends keeping Foley in place with continuous bladder irrigation.  Hand irrigate catheter as needed for worsening hematuria and/or obstruction.  Still pending urine culture.  Holding aspirin and Plavix in the acute setting given patient's bleeding.  Urology will plan for definitive stone treatment in the next 2 to 3 weeks outpatient. Hemoglobin improved to 8.4 from 7.1 today after 1 unit of pRBCs. - Dilaudid  q4h for pain management.  - CTX 2g (day 2/7) - Continuous bladder irrigation - Hold ASA, Plavix - Trend CMP - Urology following    UTI Leukocytosis Infectious encephalopathy -Patient support Antonio Navarro have noticed that  patient had become more fatigued and less interactive, giving rise to concern for acute encephalopathy. Cause of potential acute encephalopathy is most likely 2/2 infection in setting of leukocytosis, worsening obstructive urolithiasis, and UA revealing of bacteria.  Fiance at bedside this morning reports patient is seemingly more at his baseline today.  Blood cultures preliminary result showing gram-negative rods, positive for Proteus species and Enterobacterales.  Leukocytosis slightly worsened from 12.7 to 19.7 overnight.  Pharmacy recommended continuing ceftriaxone  2 g for 1 week.  Will continue to follow CBC to ensure leukocytosis is improving. -Ceftraixone 2g IV -AM CBC w Diff   Hx of anoxic brain injury Tongue/Lip Biting Dysphagia Cachexia At baseline, patient is non-ambulatory and fully dependent for ADL's, however will interact with those around him with facial expressions and is able to communicate with head nodding/shaking in response to yes/no questions. He is visibly frail, with cachetic appearance, likely multifactorial 2/2 to anoxic brain injury, immobility, sarcopenia, and poor oral intake. BMI is 18.3.Superficial wound noted on bottom lip with blood crusting around mouth. Likely  self-inflicted from chronic lip biting. Per family, pt has dysphagia at baseline with reports that he will choke on things like hotdogs, but with other things he is fine. SLP consulted, will evaluate.  His appetite is hard to gauge, but will place him on dysphagia  3 diet. - SLP eval - Options for nutritional support will have to be evaluated s/p procedure, clinical improvement during admission, and dysphagia evaluation.  AKI Per other chart, patient baseline Scr is around 1.15.  On admission was 2.16.  Slight improvement to 2.13 today. -Trend CMP - Hold GDMT   HFrecEF Per EMR, patient has history of HFrecEF with most recent echo in 2022 showing EF 55-60%.  -Will hold GDMT currently in the setting of AKI and not wanting to blunt tachycardia, as these findings are indicative of distress and pain in non-verbal patient.    Prostate cancer (clinical stage T3bN1) Patient is following with hematology/oncology physician Dr. Federico. He currently receives injections but no longer receives radiation treatments as of 01/2022.   Goals of Care Patient was transitioned to comfort based care after completing 6 (out of 30) radiation treatments in 01/2022 per most recent oncology note. A goals of care conversation was conducted at beside with patient, Antonio Navarro, and Antonio Navarro in regard to code status. Explained that patient has multiple chronic comorbidities that would increase risk of cardiovascular events and likelihood of tolerating compressions/resuscitation measures. Discussed the importance of goals of care conversation prior to any medical interventions. Antonio Navarro are his medical decision makers and opt for full code status at this time and are ok with patient receiving blood. Similar conversations documented in previous encounters in EMR.   Best Practice: Diet: Dysphagia 1 IVF: Fluids: 2 1L boluses of LR VTE: SCDs Start: 04/02/24 2058 Code: Full  Disposition planning: Therapy Recs: Home with  Hospice Family Contact: Fiance, sister DISPO: Anticipated discharge in 2-3 days to Home pending clinical improvement.  Signature:  Annalynn Centanni [facility]  12:17 PM, 04/03/2024  On Call pager [phone]

## 2024-04-04 DIAGNOSIS — D62 Acute posthemorrhagic anemia: Secondary | ICD-10-CM

## 2024-04-04 DIAGNOSIS — Z960 Presence of urogenital implants: Secondary | ICD-10-CM

## 2024-04-04 DIAGNOSIS — I5030 Unspecified diastolic (congestive) heart failure: Secondary | ICD-10-CM

## 2024-04-04 LAB — HEMOGLOBIN AND HEMATOCRIT, BLOOD
HCT: 26.3 % — ABNORMAL LOW (ref 39.0–52.0)
Hemoglobin: 8.6 g/dL — ABNORMAL LOW (ref 13.0–17.0)

## 2024-04-04 LAB — BASIC METABOLIC PANEL WITH GFR
Anion gap: 6 (ref 5–15)
BUN: 25 mg/dL — ABNORMAL HIGH (ref 8–23)
CO2: 23 mmol/L (ref 22–32)
Calcium: 8.2 mg/dL — ABNORMAL LOW (ref 8.9–10.3)
Chloride: 109 mmol/L (ref 98–111)
Creatinine, Ser: 2.28 mg/dL — ABNORMAL HIGH (ref 0.61–1.24)
GFR, Estimated: 32 mL/min — ABNORMAL LOW (ref >60)
Glucose, Bld: 135 mg/dL — ABNORMAL HIGH (ref 70–99)
Potassium: 4.1 mmol/L (ref 3.5–5.1)
Sodium: 138 mmol/L (ref 135–145)

## 2024-04-04 LAB — CBC
HCT: 18.8 % — ABNORMAL LOW (ref 39.0–52.0)
Hemoglobin: 6 g/dL — CL (ref 13.0–17.0)
MCH: 30.5 pg (ref 26.0–34.0)
MCHC: 31.9 g/dL (ref 30.0–36.0)
MCV: 95.4 fL (ref 80.0–100.0)
Platelets: 138 K/uL — ABNORMAL LOW (ref 150–400)
RBC: 1.97 MIL/uL — ABNORMAL LOW (ref 4.22–5.81)
RDW: 14.6 % (ref 11.5–15.5)
WBC: 11.2 K/uL — ABNORMAL HIGH (ref 4.0–10.5)
nRBC: 0 % (ref 0.0–0.2)

## 2024-04-04 LAB — URINE CULTURE
Culture: 80000 — AB
Culture: <10000 — AB

## 2024-04-04 LAB — RETICULOCYTES
Immature Retic Fract: 10 % (ref 2.3–15.9)
RBC.: 1.97 MIL/uL — ABNORMAL LOW (ref 4.22–5.81)
Retic Count, Absolute: 57.7 K/uL (ref 19.0–186.0)
Retic Ct Pct: 2.9 % (ref 0.4–3.1)

## 2024-04-04 LAB — PREPARE RBC (CROSSMATCH)

## 2024-04-04 MED ORDER — SODIUM CHLORIDE 0.9% IV SOLUTION: Freq: Once | INTRAVENOUS | Status: DC

## 2024-04-04 NOTE — Anesthesia Postprocedure Evaluation (Signed)
 Anesthesia Post Note  Patient: Antonio Navarro  Procedure(s) Performed: CYSTOSCOPY, WITH RETROGRADE PYELOGRAM AND BILATERAL  URETERAL STENT INSERTION (Bilateral: Ureter)     Patient location during evaluation: PACU Anesthesia Type: General Level of consciousness: awake and alert Pain management: pain level controlled Vital Signs Assessment: post-procedure vital signs reviewed and stable Respiratory status: spontaneous breathing, nonlabored ventilation, respiratory function stable and patient connected to nasal cannula oxygen Cardiovascular status: blood pressure returned to baseline and stable Postop Assessment: no apparent nausea or vomiting Anesthetic complications: no   No notable events documented.  Last Vitals:  Vitals:   04/04/24 0000 04/04/24 0400  BP: 121/76 111/76  Pulse: 90 92  Resp: 16 16  Temp:  36.8 C  SpO2: 100% 100%    Last Pain:  Vitals:   04/04/24 0400  TempSrc: Axillary                 Antonio Navarro

## 2024-04-04 NOTE — Progress Notes (Signed)
 2 Days Post-Op Subjective: Patient resting comfortably in bed.  No family at bedside.  Urine is slightly blood-tinged with CBI--2 units of PRBCs ordered overnight  Objective: Vital signs in last 24 hours: Temp:  [97.9 F (36.6 C)-98.8 F (37.1 C)] 98.8 F (37.1 C) (08/17 1130) Pulse Rate:  [90-100] 95 (08/17 1130) Resp:  [15-18] 17 (08/17 1130) BP: (110-139)/(71-91) 127/83 (08/17 1108) SpO2:  [100 %] 100 % (08/17 1130) Weight:  [56.2 kg] 56.2 kg (08/17 0500)  Intake/Output from previous day: 08/16 0701 - 08/17 0700 In: 84599 [IV Piggyback:100] Out: 24575 [Urine:24575]  Intake/Output this shift: Total I/O In: -  Out: 750 [Urine:750]  Physical Exam:  General: Patient is responsive to verbal stimuli with no indication of pain or discomfort GU: Three-way Foley catheter in place and draining slightly blood-tinged urine with moderate CBI  Lab Results: Recent Labs    04/02/24 1326 04/03/24 0348 04/04/24 0313  HGB 7.3* 8.4* 6.0*  HCT 24.1* 26.7* 18.8*   BMET Recent Labs    04/03/24 0348 04/04/24 0839  NA 139 138  K 4.8 4.1  CL 108 109  CO2 22 23  GLUCOSE 139* 135*  BUN 25* 25*  CREATININE 2.13* 2.28*  CALCIUM  8.5* 8.2*     Studies/Results: CT Renal Stone Study Result Date: 04/02/2024 CLINICAL DATA:  Abdominal flank pain.  Stone suspected EXAM: CT ABDOMEN AND PELVIS WITHOUT CONTRAST TECHNIQUE: Multidetector CT imaging of the abdomen and pelvis was performed following the standard protocol without IV contrast. RADIATION DOSE REDUCTION: This exam was performed according to the departmental dose-optimization program which includes automated exposure control, adjustment of the mA and/or kV according to patient size and/or use of iterative reconstruction technique. COMPARISON:  PET-CT 04/03/2023 FINDINGS: Lower chest: Lung bases are clear. Hepatobiliary: No focal hepatic lesion. Normal gallbladder. No biliary duct dilatation. Common bile duct is normal. Pancreas: Pancreas  is normal. No ductal dilatation. No pancreatic inflammation. Spleen: Normal spleen Adrenals/urinary tract: Adrenal glands normal. There is hydronephrosis and hydroureter of the LEFT collecting system. There is a large obstructing calculus in the mid LEFT ureter measuring 9 mm (image 46/series 3) there is high-density material within the ureter and renal pelvis proximal to the obstructing calculus which could indicate hemorrhage. Additional nonobstructing calculi in the LEFT kidney measuring 11 mm. Multiple calculi in the RIGHT kidney and RIGHT renal pelvis. No evidence obstruction. No RIGHT ureterolithiasis. No bladder calculi. There is high-density material within the lumen of the bladder nearly filling the entire bladder. Findings concerning for clot within the bladder versus less likely bladder neoplasm. Stomach/Bowel: The stomach, duodenum, and small bowel normal. The colon and rectosigmoid colon are normal. Vascular/Lymphatic: Abdominal aorta is normal caliber with atherosclerotic calcification. There is no retroperitoneal or periportal lymphadenopathy. No pelvic lymphadenopathy. Reproductive: Prostate unremarkable fiducial markers within the prostate gland. Other: Fluid within the LEFT inguinal canal.  No change from prior. Musculoskeletal: No aggressive osseous lesion. IMPRESSION: 1. Large obstructing calculus in the mid LEFT ureter. LEFT hydroureter. 2. High-density material in the LEFT renal pelvis and LEFT ureter proximal to the obstructing calculus. Findings concerning for hemorrhage within the LEFT renal pelvis and ureter. 3. High-density material within the lumen of the bladder concerning for clot versus less likely bladder neoplasm. 4. Bilateral nephrolithiasis. 5.  Aortic Atherosclerosis (ICD10-I70.0). Electronically Signed   By: Jackquline Boxer M.D.   On: 04/02/2024 19:25   CT Head Wo Contrast Result Date: 04/02/2024 CLINICAL DATA:  Lethargy. EXAM: CT HEAD WITHOUT CONTRAST TECHNIQUE: Contiguous  axial  images were obtained from the base of the skull through the vertex without intravenous contrast. RADIATION DOSE REDUCTION: This exam was performed according to the departmental dose-optimization program which includes automated exposure control, adjustment of the mA and/or kV according to patient size and/or use of iterative reconstruction technique. COMPARISON:  April 06, 2021 FINDINGS: Brain: There is generalized cerebral atrophy with widening of the extra-axial spaces and ventricular dilatation. There are areas of decreased attenuation within the white matter tracts of the supratentorial brain, consistent with microvascular disease changes. A chronic right occipital lobe infarct is seen. A chronic right basal ganglia lacunar infarct is noted. Vascular: Marked severity bilateral cavernous carotid artery calcification is noted. Skull: Normal. Negative for fracture or focal lesion. Sinuses/Orbits: No acute finding. Other: None. IMPRESSION: 1. Generalized cerebral atrophy with chronic white matter small vessel ischemic changes. 2. Chronic right occipital lobe infarct. 3. Chronic right basal ganglia lacunar infarct. 4. No acute intracranial abnormality. Electronically Signed   By: Suzen Dials M.D.   On: 04/02/2024 16:40   DG Chest Portable 1 View Result Date: 04/02/2024 CLINICAL DATA:  EMS concern for infection.  Some cough. EXAM: PORTABLE CHEST 1 VIEW COMPARISON:  None Available. FINDINGS: Low lung volume. Bilateral lung fields are clear. Bilateral costophrenic angles are clear. Normal cardio-mediastinal silhouette. No acute osseous abnormalities. The soft tissues are within normal limits. IMPRESSION: No active disease. Electronically Signed   By: Ree Molt M.D.   On: 04/02/2024 13:51    Assessment/Plan: 63 year old male with grade 5 prostate cancer, anoxic brain injury, gross hematuria and bilateral ureteral and renal stone. POD2 s/p cystoscopy with clot evac and bilateral ureteral stents    - Keep Foley catheter in place with CBI.  Hand irrigate as needed for worsening hematuria/obstruction - Will continue to monitor   LOS: 1 day   Lonni Han, MD [facility] Pager: [phone]  04/04/2024, 12:25 PM

## 2024-04-04 NOTE — Progress Notes (Addendum)
 HD#2 SUBJECTIVE:  Patient Summary: Antonio Navarro is a 63 y.o. 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   Overnight Events and Interim History: Morning Hg 6.0 and 2u PRBCs ordered by night team. No acute events, however. Some blood noted in foley output. With nods and shakes, he denies acute concerns, pain, lightheadedness, fever, chills.  Significant Hospital Events:  8/15 - Cystoscopy with bilateral ureteral stent placement and clot evacuation 8/17 - am HG 8.4 -> 6.0, 2u PRBC ordered  OBJECTIVE:  Vital Signs: Vitals:   04/04/24 0000 04/04/24 0400 04/04/24 0500 04/04/24 0749  BP: 121/76 111/76  112/77  Pulse: 90 92  99  Resp: 16 16  16   Temp:  98.3 F (36.8 C)  97.9 F (36.6 C)  TempSrc:  Axillary  Oral  SpO2: 100% 100%  100%  Weight:   56.2 kg   Height:       Supplemental O2: Room Air SpO2: 100 % O2 Flow Rate (L/min): 0 L/min  Filed Weights   04/02/24 1317 04/03/24 0500 04/04/24 0500  Weight: 54.4 kg 56.2 kg 56.2 kg    Intake/Output Summary (Last 24 hours) at 04/04/2024 0836 Last data filed at 04/04/2024 0754 Gross per 24 hour  Intake 84599 ml  Output 24175 ml  Net -8775 ml   Net IO Since Admission: -25,550 mL [04/04/24 0836]  Physical Exam: Physical Exam Constitutional:      Appearance: Chronically ill, cachectic man laying in bed in NAD. Alert and follows requests. HENT:     Mouth/Throat:     Mouth: Mucous membranes are dry.  Cardiovascular:     Rate and Rhythm: Regular rhythm. Tachycardia present.  Pulmonary:     Effort: No respiratory distress.  Abdominal:     General: Abdomen is flat. Bowel sounds are normal. There is no  distension.     Palpations: Abdomen is soft.  Skin:    General: Skin is warm and dry.  Neurological:     Mental Status: Mental status is at baseline.  Psychiatric:        Mood and Affect: Mood normal.        Behavior: Behavior normal.   Patient Lines/Drains/Airways Status     Active Line/Drains/Airways     Name Placement date Placement time Site Days   Peripheral IV 04/02/24 20 G 2.5 Left Antecubital 04/02/24  1449  Antecubital  2   Urethral Catheter Dr. Devere Latex 20 Fr. 04/02/24  2250  Latex  2   Ureteral Drain/Stent Right ureter 6 Fr. 04/02/24  2238  Right ureter  2   Ureteral Drain/Stent Left ureter 6 Fr. 04/02/24  2245  Left ureter  2            ASSESSMENT/PLAN:  Assessment: Principal Problem:   Urolithiasis Active Problems:   Prostate cancer (HCC)   Cardiomyopathy, ischemic   History of anoxic brain injury   Incontinence of bowel   Incontinence of urine   CAD (coronary artery disease) of artery bypass graft   Dysphagia   History of kidney stones   UTI (urinary tract infection)   Requires assistance with all activities of daily living (ADL)   Cachexia (HCC)   Gross hematuria   Acute on chronic blood loss  anemia   AKI (acute kidney injury) [facility])   Nonverbal communication  Antonio Navarro is a 63 y.o. 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   Plan: Acute on chronic blood loss anemia: hematuria 2/2 Bilateral nephrolithiasis, urolithiasis  Renal CT revealing of bilateral nephrolithiasis and urolithiasis of left ureter and suggestive of hemorrhage within the left renal pelvis and ureter, and clotting in the bladder. Urology performed cystoscopy with clot  evacuation and bilateral JJ stent placement. 1 cm stone of left ureter was unable to be removed.  Urology recommends continued bladder irrigation and empiric antibiotics.  Blood cultures growing Proteus and Enterobacterales, and so on CTX for 7 day course. Foley in place with continuous bladder irrigation per urology. Hand irrigate catheter as needed for worsening hematuria and/or obstruction. Still pending urine culture. Holding aspirin and Plavix in the acute setting given patient's bleeding. Urology will plan for definitive stone treatment in the next 2 to 3 weeks outpatient.   Hemoglobin now 6 and so 2u PRBCs ordered, would be 3 total units transfused. Suspect ongoing GU losses but CBC also reflects dilution. Will follow up urology recs. Pt stable at present. Underlying poor nutrition might contribute, will check reticulocytes.  - Urology following, thank you, will follow recs - Dilaudid  q4h for pain management, he has not needed it - CTX 2g (day 3/7) - Continuous bladder irrigation - Hold ASA, Plavix - Trend CMP/CBC   UTI Leukocytosis Infectious encephalopathy Proteus, Enterobacterales bactermia Early on, patient support [name] and [name] have noticed that he was more fatigued and less interactive, giving rise to concern for acute encephalopathy. Cause of potential acute encephalopathy is most likely 2/2 infection in setting of leukocytosis, worsening obstructive urolithiasis, and UA revealing of bacteria.  Blood cultures preliminary result showing gram-negative rods, positive for Proteus species and Enterobacterales and so started CTX, now improvement in WBCs, no fevers. He remains alert. -Ceftraixone 2g IV per above   AKI Secondary to above. Per other chart, patient baseline Scr is around 1.15.  On admission was 2.16.  Slight improvement so far with stents and fluids. -Trend CMP - Hold GDMT  Hx of anoxic brain injury Tongue/Lip Biting Dysphagia Cachexia At baseline, patient is  non-ambulatory and fully dependent for ADL's, however will interact with those around him with facial expressions and is able to communicate with head nodding/shaking in response to yes/no questions. He is visibly frail, with cachetic appearance, likely multifactorial 2/2 to anoxic brain injury, immobility, sarcopenia, and poor oral intake. BMI is 18.3.Superficial wound noted on bottom lip with blood crusting around mouth. Likely self-inflicted from chronic lip biting. Per family, pt has dysphagia at baseline with reports that he will choke on things like hotdogs, but with other things he is fine. SLP consulted, on dysphagia diet. - Consider RD consult    HFrecEF Per EMR, patient has history of HFrecEF with most recent echo in 2022 showing EF 55-60%.  -Will hold GDMT currently in the setting of AKI and not wanting to blunt tachycardia, as these findings are indicative of distress and pain in non-verbal patient.    Prostate cancer (clinical stage T3bN1) Patient is following with hematology/oncology physician Dr. Federico. He currently receives injections but no longer receives radiation treatments as of 01/2022.   Goals  of Care Patient was transitioned to comfort based care after completing 6 (out of 30) radiation treatments in 01/2022 per most recent oncology note. A goals of care conversation was conducted at beside with patient, [name], and [name] in regard to code status. Explained that patient has multiple chronic comorbidities that would increase risk of cardiovascular events and likelihood of tolerating compressions/resuscitation measures. Discussed the importance of goals of care conversation prior to any medical interventions. [name] and [name] are his medical decision makers and opt for full code status at this time and are ok with patient receiving blood. Similar conversations documented in previous encounters in EMR.   Best Practice: Therapy Recs: Home with Hospice Family Contact: Fiance,  sister DISPO: Anticipated discharge in 2-3 days to Home pending clinical improvement, stable Hg, urology workup.  Signature: Lonni Africa, D.O.  Internal Medicine Resident, PGY-2 [facility]  Pager: # [phone]. 8:36 AM, 04/04/2024

## 2024-04-05 ENCOUNTER — Encounter (HOSPITAL_COMMUNITY): Payer: Self-pay | Admitting: Urology

## 2024-04-05 DIAGNOSIS — E43 Unspecified severe protein-calorie malnutrition: Secondary | ICD-10-CM | POA: Insufficient documentation

## 2024-04-05 LAB — CBC
HCT: 27.4 % — ABNORMAL LOW (ref 39.0–52.0)
Hemoglobin: 9 g/dL — ABNORMAL LOW (ref 13.0–17.0)
MCH: 29.8 pg (ref 26.0–34.0)
MCHC: 32.8 g/dL (ref 30.0–36.0)
MCV: 90.7 fL (ref 80.0–100.0)
Platelets: 153 K/uL (ref 150–400)
RBC: 3.02 MIL/uL — ABNORMAL LOW (ref 4.22–5.81)
RDW: 15.5 % (ref 11.5–15.5)
WBC: 12.8 K/uL — ABNORMAL HIGH (ref 4.0–10.5)
nRBC: 0 % (ref 0.0–0.2)

## 2024-04-05 LAB — BASIC METABOLIC PANEL WITH GFR
Anion gap: 8 (ref 5–15)
BUN: 23 mg/dL (ref 8–23)
CO2: 23 mmol/L (ref 22–32)
Calcium: 8.5 mg/dL — ABNORMAL LOW (ref 8.9–10.3)
Chloride: 109 mmol/L (ref 98–111)
Creatinine, Ser: 2.1 mg/dL — ABNORMAL HIGH (ref 0.61–1.24)
GFR, Estimated: 35 mL/min — ABNORMAL LOW (ref >60)
Glucose, Bld: 107 mg/dL — ABNORMAL HIGH (ref 70–99)
Potassium: 4.3 mmol/L (ref 3.5–5.1)
Sodium: 140 mmol/L (ref 135–145)

## 2024-04-05 LAB — TYPE AND SCREEN
ABO/RH(D): O POS
Antibody Screen: NEGATIVE
Unit division: 0
Unit division: 0
Unit division: 0

## 2024-04-05 LAB — CULTURE, BLOOD (ROUTINE X 2)
Special Requests: ADEQUATE
Special Requests: ADEQUATE

## 2024-04-05 LAB — BPAM RBC
Blood Product Expiration Date: 202509142359
Blood Product Expiration Date: 202509142359
Blood Product Expiration Date: 202509142359
ISSUE DATE / TIME: 202508152212
ISSUE DATE / TIME: 202508171055
ISSUE DATE / TIME: 202508171518
Unit Type and Rh: 5100
Unit Type and Rh: 5100
Unit Type and Rh: 5100

## 2024-04-05 MED ORDER — SODIUM CHLORIDE 0.9 % IV SOLN
2.0000 g | Freq: Three times a day (TID) | INTRAVENOUS | Status: DC
  Administered 2024-04-05 – 2024-04-07 (×7): 2 g via INTRAVENOUS
  Filled 2024-04-05 (×8): qty 2000

## 2024-04-05 MED ORDER — LACTATED RINGERS IV SOLN: INTRAVENOUS | Status: AC

## 2024-04-05 NOTE — Progress Notes (Signed)
 3 Days Post-Op Subjective: Very mild hematuria if any.  No acute events overnight.  CBI clamped on rounds.  Daughter updated at bedside.  Objective: Vital signs in last 24 hours: Temp:  [97.8 F (36.6 C)-98.4 F (36.9 C)] 98.3 F (36.8 C) (08/18 1133) Pulse Rate:  [73-98] 86 (08/18 1315) Resp:  [11-21] 16 (08/18 1315) BP: (105-141)/(77-91) 116/79 (08/18 1315) SpO2:  [99 %-100 %] 100 % (08/18 1300) Weight:  [59.2 kg] 59.2 kg (08/18 0251)  Intake/Output from previous day: 08/17 0701 - 08/18 0700 In: 3745 [Blood:645; IV Piggyback:100] Out: 9900 [Urine:9900]  Intake/Output this shift: Total I/O In: 2240 [P.O.:240; Other:2000] Out: 1300 [Urine:1300]  Physical Exam:  General: Patient is responsive to verbal stimuli with no indication of pain or discomfort GU: Three-way Foley catheter in place and draining slightly blood-tinged urine with slow CBI  Lab Results: Recent Labs    04/04/24 0313 04/04/24 2147 04/05/24 0422  HGB 6.0* 8.6* 9.0*  HCT 18.8* 26.3* 27.4*   BMET Recent Labs    04/04/24 0839 04/05/24 0422  NA 138 140  K 4.1 4.3  CL 109 109  CO2 23 23  GLUCOSE 135* 107*  BUN 25* 23  CREATININE 2.28* 2.10*  CALCIUM  8.2* 8.5*     Studies/Results: No results found.   Assessment/Plan: 63 year old male with grade 5 prostate cancer, anoxic brain injury, gross hematuria and bilateral ureteral and renal stone. POD3 s/p cystoscopy with clot evac and bilateral ureteral stents   - Keep Foley catheter in place. CBI clamped on rounds. - Will continue to monitor   LOS: 2 days   Lonni Han, MD [facility] Pager: [phone]  04/05/2024, 3:24 PM

## 2024-04-05 NOTE — Progress Notes (Signed)
 Orthopedic Tech Progress Note Patient Details:  Antonio Navarro [birth year] [id number]  Patient ID: Antonio Navarro, male   DOB: 02/26/1961, 63 y.o.   MRN: [id number] Nurse or secretary called for bilateral resting hand splints from Saturday. They did not receive it but [employer] order was called in from another Ortho tech. I reached out to [employer] to verify the order. Waiting on a update.  Alver Leete L Kechia Yahnke 04/05/2024, 11:59 AM

## 2024-04-05 NOTE — Progress Notes (Signed)
 Initial Nutrition Assessment  DOCUMENTATION CODES:  Severe malnutrition in context of chronic illness  INTERVENTION:  Continue dysphagia 3 diet per SLP Full supervision with meals, small sips and bites Mighty Shake TID with meals, each supplement provides 330 kcals and 9 grams of protein Magic cup TID with meals, each supplement provides 290 kcal and 9 grams of protein  NUTRITION DIAGNOSIS:  Severe Malnutrition related to chronic illness (ABI r/t cardiac arrest, STEMI, ischemic cardiomyopathy) as evidenced by severe muscle depletion, severe fat depletion.  GOAL:  Patient will meet greater than or equal to 90% of their needs  MONITOR:  PO intake, Supplement acceptance, Labs, Weight trends  REASON FOR ASSESSMENT:  Consult Poor PO (Hx dysphagia, cachectic, anoxic brain injury)  ASSESSMENT:  Pt admitted with increased fatigue, hematuria, anemia, AMS, found to have obstructive urolithiasis. PMH significant for ABI d/t cardiac arrest (2021), STEMI, gait instability, dysphagia, GERD, ischemic cardiomyopathy, prostate cancer.  8/15: s/p cystoscopy with bilateral ureteral stent placement 8/16: s/p SLP evaluation- d/t cognitive based dysphagia, recommend dysphagia 3   At baseline pt is non-ambulatory and fully dependent with ADL's. He communicates with facial expressions and head nodding. Noted palliative consulted for GOC.   Checked in with patient at bedside. He does not have any family/visitors present at bedside. He nods no to having had breakfast this morning but yes to feeling hungry. He nods no to consuming nutrition supplements at home PTA.   No documented meal completions to review. Spoke with RN who reports that pt consumed 0% for breakfast this morning.   Reviewed weight history from merged chart.  11/28/23: 61.9 kg 04/30/23: 61.7 kg 03/17/23: 64.7 kg  It appears pt's weight has continued to gradually decline over the last year.   UOP: x24 hours   Medications:  reviewed  Labs:  Cr 2.10 GFR 35 Hgb 9.0  NUTRITION - FOCUSED PHYSICAL EXAM: Suspect a degree of muscle and subcutaneous deficits are r/t to atrophy however given degree of deficits and chronic co-morbidities, pt meets criteria for malnutrition.  Flowsheet Row Most Recent Value  Orbital Region Severe depletion  Upper Arm Region Severe depletion  Thoracic and Lumbar Region Severe depletion  Buccal Region Severe depletion  Temple Region Severe depletion  Clavicle Bone Region Severe depletion  Clavicle and Acromion Bone Region Severe depletion  Scapular Bone Region Severe depletion  Dorsal Hand Severe depletion  Patellar Region Severe depletion  Anterior Thigh Region Severe depletion  Posterior Calf Region Severe depletion  Edema (RD Assessment) None  Hair Reviewed  Eyes Reviewed  Mouth Reviewed  Skin Reviewed  Nails Reviewed    Diet Order:   Diet Order             DIET DYS 3 Room service appropriate? Yes with Assist; Fluid consistency: Thin  Diet effective now                   EDUCATION NEEDS:  No education needs have been identified at this time  Skin:  Skin Assessment: Reviewed RN Assessment  Last BM:  8/14 type 4 medium  Height:  Ht Readings from Last 1 Encounters:  04/02/24 5' 9 (1.753 m)    Weight:  Wt Readings from Last 1 Encounters:  04/05/24 59.2 kg   BMI:  Body mass index is 19.27 kg/m.  Estimated Nutritional Needs:   Kcal:  1600-1800  Protein:  80-95g  Fluid:  >/=1.6L  Allie Grantham Hippert, RDN, LDN Clinical Nutrition See AMiON for contact information.

## 2024-04-05 NOTE — TOC Initial Note (Signed)
 Transition of Care (TOC) - Initial/Assessment Note  Rayfield Gobble RN, BSN Inpatient Care Management Unit 4E- RN Case Manager See Treatment Team for direct phone #   Patient Details  Name: Antonio Navarro MRN: [id number] Date of Birth: 09/26/1960  Transition of Care [facility]) CM/SW Contact:    Gobble Rayfield Hurst, RN Phone Number: 04/05/2024, 2:36 PM  Clinical Narrative:                 Msg received from therapy regarding need for hoyer lift at home.   CM to bedside to speak with SO, however she was not present, pt's sister- Robertha was present at the bedside.  Per [name]- SO has spoken about needing a lift for home. Pt does have a hospital bed. Has been bed-bound for about 5yrs.  [name] voiced that pt has CAPS worker, and is approved for 45hr/week, aide comes 9a-5p M-F, and 3hrs on Sun.   [name] asked about HH services vs Hospice- discussed difference between palliative care and Hospice as well as HH. [name] voiced that SO would likely like info on [facility] options.  CM will follow up with SO and provide list for [facility] choice.   Pt will need DME order for other- Hoyer Lift-  CM to follow up to arrange once order has been placed.   IP CM will continue to follow    Expected Discharge Plan: Home w Home Health Services Barriers to Discharge: Continued Medical Work up   Patient Goals and CMS Choice Patient states their goals for this hospitalization and ongoing recovery are:: return home CMS Medicare.gov Compare Post Acute Care list provided to:: Patient Represenative (must comment) Choice offered to / list presented to : Sibling (SO)      Expected Discharge Plan and Services   Discharge Planning Services: CM Consult Post Acute Care Choice: Durable Medical Equipment, Home Health Living arrangements for the past 2 months: Single Family Home                 DME Arranged: Other see comment (hoyer lift)                    Prior Living Arrangements/Services Living  arrangements for the past 2 months: Single Family Home Lives with:: Self, Significant Other Patient language and need for interpreter reviewed:: Yes Do you feel safe going back to the place where you live?: Yes      Need for Family Participation in Patient Care: Yes (Comment) Care giver support system in place?: Yes (comment) Current home services: DME, Homehealth aide [facility] bed) Criminal Activity/Legal Involvement Pertinent to Current Situation/Hospitalization: No - Comment as needed  Activities of Daily Living      Permission Sought/Granted Permission sought to share information with : Facility Medical sales representative                Emotional Assessment Appearance:: Appears stated age Attitude/Demeanor/Rapport: Unable to Assess Affect (typically observed): Calm   Alcohol / Substance Use: Not Applicable Psych Involvement: No (comment)  Admission diagnosis:  Urolithiasis [N20.9] Acute kidney injury (HCC) [N17.9] Symptomatic anemia [D64.9] Patient Active Problem List   Diagnosis Date Noted   Protein-calorie malnutrition, severe 04/05/2024   Acute blood loss anemia 04/04/2024   Urolithiasis 04/02/2024   Prostate cancer (HCC) 04/02/2024   Cardiomyopathy, ischemic 04/02/2024   History of anoxic brain injury 04/02/2024   Incontinence of bowel 04/02/2024   Incontinence of urine 04/02/2024   CAD (coronary artery disease) of artery bypass graft 04/02/2024  Dysphagia 04/02/2024   History of kidney stones 04/02/2024   UTI (urinary tract infection) 04/02/2024   Requires assistance with all activities of daily living (ADL) 04/02/2024   Cachexia (HCC) 04/02/2024   Gross hematuria 04/02/2024   Acute on chronic blood loss anemia 04/02/2024   AKI (acute kidney injury) (HCC) 04/02/2024   Nonverbal communication 04/02/2024   PCP:  Freddrick Johns Pharmacy:   [id number] [address] ([address]), [place] - [address] [place] ([place]) [place] [address] Phone:  [phone] Fax: [phone]     Social Drivers of Health (SDOH) Social History:   SDOH Interventions:     Readmission Risk Interventions     No data to display

## 2024-04-05 NOTE — Progress Notes (Signed)
 Occupational Therapy Treatment Patient Details Name: Antonio Navarro MRN: [id number] DOB: [birth year] Today's Date: 04/05/2024   History of present illness Pt is a 63 y.o. male who presented 04/02/24 with family concern for UTI and AMS. Pt admitted with gross hematuria with acute blood loss anemia secondary to an obstructing left proximal ureteral stone. Imaging also suggestive of hemorrhage within the left renal pelvis and ureter and clot in the bladder. S/p cystoscopy with bil ureteral stent placement and clot evacuation 8/15. 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 B hand splints have not yet arrived since ordered late afternoon 04/02/24 and this OT placed call to ortho tech dept ~10:53 a.m. and ortho to call [facility] and re order if necessary. Explained to caregiver/fiance Antonio Navarro that call to ortho has been placed and will continue to await B hand splints for donning and family education. OT provided PROM to B UEs, caregiver/fiance Antonio Navarro reports that she has been providing PROM to B UEs. Pt required +2 total A to sit EOB with zero sitting balance requiring max/total A with pt pushing heavily backwards and toward HOB to return to supine. OT and PT planning to transfer to pt chair today but unsuccessful; notified nursing staff to use mechanical lift when transferring pt. OT will continue to follow acutely and awaiting B hand splints from ortho           If plan is discharge home, recommend the following:  Two people to help with walking and/or transfers;Two people to help with bathing/dressing/bathroom;Assistance with cooking/housework;Assistance with feeding;Supervision due to cognitive status;Assist for transportation;Help with stairs or ramp for  entrance;Direct supervision/assist for medications management   Equipment Recommendations  Other (comment);Hoyer lift (B hand splints on order)    Recommendations for Other Services      Precautions / Restrictions Precautions Precautions: Fall Precaution/Restrictions Comments: watch HR; minimally verbal at baseline; pt with foley and continuous bladder irrigation, OK per RN to mobilize with irrigation in place. Restrictions Weight Bearing Restrictions Per Provider Order: No       Mobility Bed Mobility Overal bed mobility: Needs Assistance Bed Mobility: Rolling, Sidelying to Sit, Sit to Sidelying Rolling: Max assist, +2 for physical assistance Sidelying to sit: Total assist, +2 for physical assistance     Sit to sidelying: Total assist, +2 for physical assistance General bed mobility comments: Pt wtih posterior lean and leans toward R side (HOB) frequently. Log roll for comfort to L EOB to sit up, and due to continuous bladder irrigation lines. Pt nods yes to are you dizzy/nauseous? As he increases posterior lean. Pt with audible gas sounds sitting EOB but no solid stools observed when returning to supine, RN/NT notified pt family requesting bed linens changed after session as old smears are present on his bed pad/sheets.    Transfers                         Balance Overall balance assessment: Needs assistance Sitting-balance support: Feet supported Sitting balance-Leahy Scale: Zero Sitting balance - Comments: variable mod to total A for seated trunk support due to heavy posterior and at times R sided pushing (toward [facility]) and c/o nausea or dizziness (pt non-verbal but nods/shakes head appropriately to yes/no questions). Pt with audible gas bowel sounds while seated.  ADL either performed or assessed with clinical judgement   ADL Overall ADL's : At baseline                                        General ADL Comments: Pt is total A for all ADLs, that is also his baseline    Extremity/Trunk Assessment Upper Extremity Assessment Upper Extremity Assessment: Generalized weakness;RUE deficits/detail;LUE deficits/detail RUE Deficits / Details: slight wrist contractures with digits stiff predominantly in extenstion. PROM of elbow and shoulder WFL. very light squeeze. LUE Deficits / Details: PROM of elbow WFL, L shouder stiff beyond 80 deg PROM wrist contracted but able to do PROM to neutral. very light squeeze which seemed more like thumb adduction. pull back when extending wrist further past neutral.       Cervical / Trunk Assessment Cervical / Trunk Assessment: Kyphotic    Vision Patient Visual Report: No change from baseline     Perception     Praxis     Communication Communication Communication: Impaired Factors Affecting Communication: Difficulty expressing self   Cognition Arousal: Alert Behavior During Therapy: Flat affect, Impulsive                                 Following commands: Impaired Following commands impaired: Follows one step commands inconsistently, Follows one step commands with increased time      Cueing   Cueing Techniques: Verbal cues, Gestural cues, Tactile cues  Exercises      Shoulder Instructions       General Comments see BP in comments above; bed pad with discoloration but no solid smears after pt passed gas, per spouse linens were soiled prior to session, NT notified pt needs bed linen change after session, PTA brought fitted sheet, bed pad and top sheet to room. HR increased to 130's bpm while pt seated, decrased to 80's bpm at rest, SpO2 WFL 97-100% on RA throughout, RN notified pt weaned to RA during session as he does not wear O2 at home/baseline. Heels floated but RN notified pt would benefit from air mattress given pt not able to relieve his own pressure at baseline.    Pertinent Vitals/ Pain       Pain  Assessment Pain Assessment: PAINAD Faces Pain Scale: No hurt Breathing: noisy labored breathing, long periods of hyperventilation, Cheyne-Stokes respirations Body Language: tense, distressed pacing, fidgeting Consolability: distracted or reassured by voice/touch Pain Intervention(s): Limited activity within patient's tolerance, Monitored during session, Repositioned  Home Living                                          Prior Functioning/Environment              Frequency  Min 1X/week        Progress Toward Goals  OT Goals(current goals can now be found in the care plan section)  Progress towards OT goals: OT to reassess next treatment  ADL Goals Additional ADL Goal #3: Pt will improve sitting tolerance for OOB to prevent risk of further debility and skin breakdown  Plan      Co-evaluation                 AM-PAC OT 6 Clicks Daily Activity  Outcome Measure   Help from another person eating meals?: Total Help from another person taking care of personal grooming?: Total Help from another person toileting, which includes using toliet, bedpan, or urinal?: Total Help from another person bathing (including washing, rinsing, drying)?: Total Help from another person to put on and taking off regular upper body clothing?: Total Help from another person to put on and taking off regular lower body clothing?: Total 6 Click Score: 6    End of Session    OT Visit Diagnosis: Unsteadiness on feet (R26.81);Other abnormalities of gait and mobility (R26.89);Other symptoms and signs involving cognitive function   Activity Tolerance Patient limited by fatigue   Patient Left in bed;with call bell/phone within reach;with bed alarm set   Nurse Communication Need for lift equipment;Mobility status        Time: 1300-1310 OT Time Calculation (min): 10 min  Charges: OT General Charges $OT Visit: 1 Visit OT Treatments $Therapeutic Activity: 8-22  mins    Jacques Karna Loose 04/05/2024, 2:55 PM

## 2024-04-05 NOTE — Progress Notes (Signed)
 Speech Language Pathology Treatment: Dysphagia  Patient Details Name: Tirrell Buchberger MRN: [id number] DOB: Dec 16, 1960 Today's Date: 04/05/2024 Time: 8754-8741 SLP Time Calculation (min) (ACUTE ONLY): 13 min  Assessment / Plan / Recommendation Clinical Impression  Pt seen for dysphagia f/u tx session with fiance in attendance and providing information regarding swallowing d/t being primary caregiver for pt.  Pt observed with limited consistencies d/t satiety level and he had just completed his lunch tray.  Pt consumed puree/thin via straw with small sips with slight oral holding, but no overt s/s of aspiration present.  His fiance stated he has not been pocketing food in buccal areas during meals.  No oral residue noted within oral cavity post lunch intake during tx session.  Discussed/reviewed swallow precautions given cognitive status with pt/fiance and continue with current diet of Dysphagia 3(mechanical soft)/thin liquids.  Education complete/pt tolerating current diet without overt s/s of aspiration present, so ST will s/o at this time.    HPI HPI: Jaiveer Panas is a 63 y.o. 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 He is accompanied by his partner [name] in person and his sister [name] via phone who provide collateral. They have noticed patient having increased fatigue and not acting himself for the last week and half. He is usually able to be communicated with via yes or no questions, but they have had increased difficulty communicating with him in this time. He has not eaten normally in 3-4 days and has not eaten more than small bites of food intermittently. He has not been orally hydrating as normal. ST consulted for swallow evaluation. ST f/u for diet  tolerance/education regarding upgraded diet.      SLP Plan  Discharge SLP treatment due to (comment) (goals achieved)          Recommendations  Diet recommendations: Dysphagia 3 (mechanical soft);Thin liquid Liquids provided via: Cup;Straw Medication Administration: Other (Comment) (as tolerated; with small sips and/or whole or crushed in puree) Supervision: Trained caregiver to feed patient;Full supervision/cueing for compensatory strategies Compensations: Slow rate;Small sips/bites;Lingual sweep for clearance of pocketing;Minimize environmental distractions Postural Changes and/or Swallow Maneuvers: Seated upright 90 degrees                  Oral care BID;Staff/trained caregiver to provide oral care   Frequent or constant Supervision/Assistance Dysphagia, unspecified (R13.10)     Discharge SLP treatment due to (comment) (goals achieved)     Pat Ahniya Mitchum,M.S.,CCC-SLP  04/05/2024, 1:09 PM

## 2024-04-05 NOTE — Progress Notes (Signed)
 Physical Therapy Treatment Patient Details Name: Antonio Navarro MRN: [id number] DOB: 12-08-1960 Today's Date: 04/05/2024   History of Present Illness Pt is a 63 y.o. male who presented 04/02/24 with family concern for UTI and AMS. Pt admitted with gross hematuria with acute blood loss anemia secondary to an obstructing left proximal ureteral stone. Imaging also suggestive of hemorrhage within the left renal pelvis and ureter and clot in the bladder. S/p cystoscopy with bil ureteral stent placement and clot evacuation 8/15. 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 Family member had to leave prior to EOB transfer. Pt needing up to +2 totalA to transfer to/from R EOB, with poor to zero seated balance once edge of bed and pt impulsively extending trunk/pushing backward and toward [facility] on his R side, needing heavy assist to prevent loss of balance while seated. Pt also with audible gas noises while seated, no visible stool other than pre-existing smears on bed pad and pt sheets, RN/NT notified as family was requesting new linens be placed on his bed. Pt continues to benefit from PT services to progress toward functional mobility goals, family agreeable to hoyer DME per discussion, pt/family would also benefit from info on stair lift or ramp for home entry.     If plan is discharge home, recommend the following: Two people to help with walking and/or transfers;A lot of help with bathing/dressing/bathroom;Assistance with cooking/housework;Assistance with feeding;Direct supervision/assist for medications management;Direct supervision/assist for financial management;Assist for  transportation;Help with stairs or ramp for entrance;Supervision due to cognitive status   Can travel by private vehicle        Equipment Recommendations  [place] lift;Other (comment) (ramp for home access)    Recommendations for Other Services       Precautions / Restrictions Precautions Precautions: Fall Recall of Precautions/Restrictions: Impaired Precaution/Restrictions Comments: watch HR; minimally verbal at baseline; pt with foley and continuous bladder irrigation, OK per RN to mobilize with irrigation in place. Restrictions Weight Bearing Restrictions Per Provider Order: No     Mobility  Bed Mobility Overal bed mobility: Needs Assistance Bed Mobility: Rolling, Sidelying to Sit, Sit to Sidelying Rolling: Max assist, +2 for physical assistance Sidelying to sit: Total assist, +2 for physical assistance     Sit to sidelying: Total assist, +2 for physical assistance General bed mobility comments: Pt wtih posterior lean and leans toward R side (HOB) frequently. Log roll for comfort to L EOB to sit up, and due to continuous bladder irrigation lines. Pt nods yes to are you dizzy/nauseous? As he increases posterior lean. Pt with audible gas sounds sitting EOB but no solid stools observed when returning to supine, RN/NT notified pt family requesting bed linens changed after session as old smears are present on his bed pad/sheets.    Transfers Overall transfer level: Needs assistance                 General transfer comment: defer STS 2/2 pt heavy posterior pushing/trunk extension while seated and possible orthostatic hypotension symptoms (BP likely inaccurate when taken seated due to pt restlessness); BP lower after return to supine than when taken by RN previous hour.    Ambulation/Gait  General Gait Details: defer 2/2 pt decreased tolerance for seated posture at EOB and possible orthostatic symptoms; family interested in hoyer/mechanical lift for  home.   Stairs Stairs:  (recommend pt/family install a ramp for ease of entry to home)           Wheelchair Mobility     Tilt Bed    Modified Rankin (Stroke Patients Only)       Balance Overall balance assessment: Needs assistance Sitting-balance support: Feet supported Sitting balance-Leahy Scale: Zero Sitting balance - Comments: variable mod to totalA for seated trunk support due to heavy posterior and at times R sided pushing (toward [facility]) and c/o nausea or dizziness (pt non-verbal but nods/shakes head appropriately to yes/no questions). Pt with audible gas bowel sounds while seated. Postural control: Posterior lean, Right lateral lean     Standing balance comment: defer, pt totalA for seated balance                            Communication Communication Communication: Impaired Factors Affecting Communication: Difficulty expressing self (minimally verbal at baseline, significant other reports he can state his name intermittently)  Cognition Arousal: Alert Behavior During Therapy: Flat affect, Impulsive   PT - Cognitive impairments: History of cognitive impairments, Initiation, Sequencing, Problem solving, Safety/Judgement, Attention, Awareness, Difficult to assess Difficult to assess due to: Impaired communication                     PT - Cognition Comments: Patient would answer yes/no questions via head shakes/nods inconsistently, sometimes needed questions repeated or his name called prior to it to gain his attention. Delayed processing and initiation with mobility. Per kelby Cramp, he appears close to cog baseline, and can vary in assist levels from day to day. Following commands: Impaired Following commands impaired: Follows one step commands inconsistently, Follows one step commands with increased time    Cueing Cueing Techniques: Verbal cues, Gestural cues, Tactile cues  Exercises      General Comments General comments (skin integrity,  edema, etc.): see BP in comments above; bed pad with discoloration but no solid smears after pt passed gas, per spouse linens were soiled prior to session, NT notified pt needs bed linen change after session, PTA brought fitted sheet, bed pad and top sheet to room. HR increased to 130's bpm while pt seated, decrased to 80's bpm at rest, SpO2 WFL 97-100% on RA throughout, RN notified pt weaned to RA during session as he does not wear O2 at home/baseline. Heels floated but RN notified pt would benefit from air mattress given pt not able to relieve his own pressure at baseline.      Pertinent Vitals/Pain Pain Assessment Pain Assessment: PAINAD Breathing: noisy labored breathing, long periods of hyperventilation, Cheyne-Stokes respirations Negative Vocalization: none Facial Expression: smiling or inexpressive Body Language: tense, distressed pacing, fidgeting Consolability: distracted or reassured by voice/touch PAINAD Score: 4 Pain Intervention(s): Limited activity within patient's tolerance, Monitored during session, Repositioned    Home Living                          Prior Function            PT Goals (current goals can now be found in the care plan section) Acute Rehab PT Goals Patient Stated Goal: did not state; wife desires for whatever is best for pt PT Goal Formulation: With patient/family Time For Goal  Achievement: 04/17/24 Progress towards PT goals: Not progressing toward goals - comment    Frequency    Min 2X/week      PT Plan      Co-evaluation              AM-PAC PT 6 Clicks Mobility   Outcome Measure  Help needed turning from your back to your side while in a flat bed without using bedrails?: A Lot Help needed moving from lying on your back to sitting on the side of a flat bed without using bedrails?: Total (+2 assist, pt not able to help) Help needed moving to and from a bed to a chair (including a wheelchair)?: Total Help needed standing up  from a chair using your arms (e.g., wheelchair or bedside chair)?: Total Help needed to walk in hospital room?: Total Help needed climbing 3-5 steps with a railing? : Total 6 Click Score: 7    End of Session Equipment Utilized During Treatment: Other (comment) (weaned to RA at start of session.) Activity Tolerance: Patient limited by fatigue;Other (comment) (poor seated tolerance) Patient left: in bed;with call bell/phone within reach;with bed alarm set;Other (comment) (heels floated, HOB ~45 deg, SCD replaced but not turned on since pt about to get bed linen changed) Nurse Communication: Mobility status;Need for lift equipment;Precautions;Other (comment) (rec air mattress) PT Visit Diagnosis: Unsteadiness on feet (R26.81);Other abnormalities of gait and mobility (R26.89);Muscle weakness (generalized) (M62.81);Difficulty in walking, not elsewhere classified (R26.2)     Time: 8691-8678 PT Time Calculation (min) (ACUTE ONLY): 13 min  Charges:    $Therapeutic Activity: 8-22 mins PT General Charges $$ ACUTE PT VISIT: 1 Visit                     Kamylah Manzo P., PTA Acute Rehabilitation Services Secure Chat Preferred 9a-5:30pm Office: [phone]    Connell HERO [facility] 04/05/2024, 2:20 PM

## 2024-04-05 NOTE — Progress Notes (Cosign Needed Addendum)
 HD#3 SUBJECTIVE:  Patient Summary: Antonio Navarro is a 63 y.o. 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   Overnight Events and Interim History:  With nods and shakes, he denies acute concerns, pain, lightheadedness, fever, chills. He nods yes that he has been able to eat and drink today.  Significant Hospital Events:  8/15 - Cystoscopy with bilateral ureteral stent placement and clot evacuation 8/17 - am HG 8.4 -> 6.0, 2u PRBC ordered  OBJECTIVE:  Vital Signs: Vitals:   04/05/24 0751 04/05/24 1100 04/05/24 1133 04/05/24 1200  BP: 130/82  (!) 130/90 (!) 141/91  Pulse: 98 88 77 78  Resp: 14 13 16 15   Temp: 98 F (36.7 C)  98.3 F (36.8 C)   TempSrc: Axillary  Axillary   SpO2: 100% 99% 100% 100%  Weight:      Height:       Supplemental O2: Room Air SpO2: 100 % O2 Flow Rate (L/min): 0 L/min  Filed Weights   04/03/24 0500 04/04/24 0500 04/05/24 0251  Weight: 56.2 kg 56.2 kg 59.2 kg    Intake/Output Summary (Last 24 hours) at 04/05/2024 1325 Last data filed at 04/05/2024 1100 Gross per 24 hour  Intake 4415 ml  Output 6950 ml  Net -2535 ml   Net IO Since Admission: -31,805 mL [04/05/24 1325]  Physical Exam: Physical Exam Constitutional:      Appearance: Chronically ill, cachectic man laying in bed in NAD. Alert and follows requests. HENT:     Mouth/Throat:     Mouth: Mucous membranes are dry.  Cardiovascular:     Rate and Rhythm: Regular rhythm. Tachycardia present.  Pulmonary:     Effort: No respiratory distress.  Abdominal:     General: Abdomen is flat. Bowel sounds are normal. There is no distension.     Palpations:  Abdomen is soft.  Skin:    General: Skin is warm and dry.  Neurological:     Mental Status: Mental status is at baseline.  Psychiatric:        Mood and Affect: Mood normal.        Behavior: Behavior normal.   Patient Lines/Drains/Airways Status     Active Line/Drains/Airways     Name Placement date Placement time Site Days   Peripheral IV 04/02/24 20 G 2.5 Left Antecubital 04/02/24  1449  Antecubital  2   Urethral Catheter Dr. Devere Latex 20 Fr. 04/02/24  2250  Latex  2   Ureteral Drain/Stent Right ureter 6 Fr. 04/02/24  2238  Right ureter  2   Ureteral Drain/Stent Left ureter 6 Fr. 04/02/24  2245  Left ureter  2            ASSESSMENT/PLAN:  Assessment: Principal Problem:   Urolithiasis Active Problems:   Prostate cancer (HCC)   Cardiomyopathy, ischemic   History of anoxic brain injury   Incontinence of bowel   Incontinence of urine   CAD (coronary artery disease) of artery bypass graft   Dysphagia   History of kidney stones   UTI (urinary tract infection)   Requires assistance with all activities of daily living (ADL)   Cachexia (HCC)   Gross hematuria   Acute on chronic blood loss anemia  AKI (acute kidney injury) (HCC)   Nonverbal communication   Acute blood loss anemia  Antonio Navarro is a 63 y.o. 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   Plan: Acute on chronic blood loss anemia: hematuria 2/2 Bilateral nephrolithiasis, urolithiasis  Renal CT revealing of bilateral nephrolithiasis and urolithiasis of left ureter and suggestive of hemorrhage within the left renal pelvis and ureter, and clotting in the bladder. Urology performed cystoscopy with clot  evacuation and bilateral JJ stent placement. 1 cm stone of left ureter was unable to be removed.  Urology recommends continued bladder irrigation and empiric antibiotics.  Blood cultures growing Proteus and Enterobacterales, and so on CTX for 7 day course. Foley in place with continuous bladder irrigation per urology. Hand irrigate catheter as needed for worsening hematuria and/or obstruction. Urine cultures show insignificant growth with <10,000 colonies. Holding aspirin and Plavix in the acute setting given patient's bleeding. Urology will plan for definitive stone treatment in the next 2 to 3 weeks outpatient.   Pt has received 3u PRBCS since admission. Hgb today 9.0. Suspect ongoing GU losses but CBC also reflects dilution. Will follow up urology recs. Pt stable at present. Underlying poor nutrition might contribute. - Urology following, will continue to follow recs - Dilaudid  q4h for pain management, he has not needed it - Continuous bladder irrigation - Hold ASA, Plavix - Trend CMP/CBC   UTI Leukocytosis Infectious encephalopathy Proteus, Enterobacterales bactermia Initially more fatigued and less interactive with concern for acute encephalopathy. Likely 2/2 infection in setting of leukocytosis, worsening obstructive urolithiasis, and UA revealing of bacteria. Improving WBC today, afebrile. Blood cultures preliminary result showing gram-negative rods, positive for Proteus species. 4 doses of CTX given, now narrowing to IV ampicillin  2g. -IV Ampicillin  2g q8hr   AKI Secondary to above. Per other chart, patient baseline Scr is around 1.15.  On admission was 2.16.  Slight improvement so far with stents and fluids. -Trend CMP -- Hold GDMT  Hx of anoxic brain injury Tongue/Lip Biting Dysphagia Cachexia At baseline, patient is non-ambulatory and fully dependent for ADL's, however will interact with those around him with facial expressions and is able to communicate with head nodding/shaking  in response to yes/no questions. He is visibly frail, with cachetic appearance, likely multifactorial 2/2 to anoxic brain injury, immobility, sarcopenia, and poor oral intake. BMI is 18.3.Superficial wound noted on bottom lip with blood crusting around mouth. Likely self-inflicted from chronic lip biting. Per family, pt has dysphagia at baseline with reports that he will choke on things like hotdogs, but with other things he is fine. SLP consulted, on dysphagia diet. RD consulted, pending recommendations -f/u RD recommendations    HFrecEF Per EMR, patient has history of HFrecEF with most recent echo in 2022 showing EF 55-60%.  -Will hold GDMT currently in the setting of AKI and not wanting to blunt tachycardia, as these findings are indicative of distress and pain in non-verbal patient.    Prostate cancer (clinical stage T3bN1) Patient is following with hematology/oncology physician Dr. Federico. He currently receives injections but no longer receives radiation treatments as of 01/2022.   Goals of Care Patient was transitioned to comfort based care after completing 6 (out of 30) radiation treatments in 01/2022 per most recent oncology note. A  goals of care conversation was conducted at beside with patient, Antonio Navarro, and Antonio Navarro in regard to code status. Explained that patient has multiple chronic comorbidities that would increase risk of cardiovascular events and likelihood of tolerating compressions/resuscitation measures. Discussed the importance of goals of care conversation prior to any medical interventions. Antonio Navarro and Antonio Navarro are his medical decision makers and opt for full code status at this time and are ok with patient receiving blood. Similar conversations documented in previous encounters in EMR.  - Palliative consult for quality of life/GOC  Best Practice: Therapy Recs: Home with Hospice Family Contact: Fiance, sister DISPO: Anticipated discharge in 1-2 days to Home pending clinical  improvement, stable Hg, urology workup.  Signature: Rasheen Schewe, DO Internal Medicine Resident, PGY-1 Please contact the on call pager at [phone] for any urgent or emergent needs. 1:25 PM 04/05/2024

## 2024-04-06 DIAGNOSIS — Z515 Encounter for palliative care: Secondary | ICD-10-CM | POA: Diagnosis not present

## 2024-04-06 DIAGNOSIS — I255 Ischemic cardiomyopathy: Secondary | ICD-10-CM

## 2024-04-06 DIAGNOSIS — D649 Anemia, unspecified: Secondary | ICD-10-CM

## 2024-04-06 DIAGNOSIS — D72829 Elevated white blood cell count, unspecified: Secondary | ICD-10-CM

## 2024-04-06 DIAGNOSIS — N179 Acute kidney failure, unspecified: Secondary | ICD-10-CM

## 2024-04-06 DIAGNOSIS — Z7189 Other specified counseling: Secondary | ICD-10-CM | POA: Diagnosis not present

## 2024-04-06 DIAGNOSIS — Z8669 Personal history of other diseases of the nervous system and sense organs: Secondary | ICD-10-CM

## 2024-04-06 DIAGNOSIS — D6489 Other specified anemias: Secondary | ICD-10-CM

## 2024-04-06 LAB — BASIC METABOLIC PANEL WITH GFR
Anion gap: 8 (ref 5–15)
BUN: 18 mg/dL (ref 8–23)
CO2: 24 mmol/L (ref 22–32)
Calcium: 8.4 mg/dL — ABNORMAL LOW (ref 8.9–10.3)
Chloride: 109 mmol/L (ref 98–111)
Creatinine, Ser: 1.77 mg/dL — ABNORMAL HIGH (ref 0.61–1.24)
GFR, Estimated: 43 mL/min — ABNORMAL LOW (ref >60)
Glucose, Bld: 130 mg/dL — ABNORMAL HIGH (ref 70–99)
Potassium: 3.9 mmol/L (ref 3.5–5.1)
Sodium: 141 mmol/L (ref 135–145)

## 2024-04-06 LAB — CBC
HCT: 26.9 % — ABNORMAL LOW (ref 39.0–52.0)
Hemoglobin: 8.6 g/dL — ABNORMAL LOW (ref 13.0–17.0)
MCH: 29.4 pg (ref 26.0–34.0)
MCHC: 32 g/dL (ref 30.0–36.0)
MCV: 91.8 fL (ref 80.0–100.0)
Platelets: 174 K/uL (ref 150–400)
RBC: 2.93 MIL/uL — ABNORMAL LOW (ref 4.22–5.81)
RDW: 14.9 % (ref 11.5–15.5)
WBC: 9 K/uL (ref 4.0–10.5)
nRBC: 0 % (ref 0.0–0.2)

## 2024-04-06 MED ORDER — ENZALUTAMIDE 40 MG PO TABS
40.0000 mg | ORAL_TABLET | Freq: Every day | ORAL | Status: DC
  Administered 2024-04-08: 40 mg via ORAL

## 2024-04-06 MED ORDER — ENSURE PLUS HIGH PROTEIN PO LIQD
237.0000 mL | Freq: Two times a day (BID) | ORAL | Status: DC
  Administered 2024-04-06 – 2024-04-08 (×5): 237 mL via ORAL

## 2024-04-06 NOTE — Plan of Care (Signed)

## 2024-04-06 NOTE — Progress Notes (Signed)
 4 Days Post-Op Subjective: Patient remained hematuria free overnight.  Will plan for voiding trial tomorrow morning.  NAEON.  Objective: Vital signs in last 24 hours: Temp:  [97.1 F (36.2 C)-98.9 F (37.2 C)] 97.1 F (36.2 C) (08/19 1119) Pulse Rate:  [77-100] 77 (08/19 1119) Resp:  [12-20] 18 (08/19 1119) BP: (118-145)/(85-100) 118/85 (08/19 1119) SpO2:  [100 %] 100 % (08/19 0959) Weight:  [59.2 kg] 59.2 kg (08/19 0342)  Intake/Output from previous day: 08/18 0701 - 08/19 0700 In: 2620 [P.O.:420; IV Piggyback:200] Out: 2300 [Urine:2300]  Intake/Output this shift: Total I/O In: 1200 [P.O.:100; Other:1000; IV Piggyback:100] Out: 2100 [Urine:2100]  Physical Exam:  General: Patient is responsive to verbal stimuli with no indication of pain or discomfort GU: Clear yellow urine  Lab Results: Recent Labs    04/04/24 2147 04/05/24 0422 04/06/24 0927  HGB 8.6* 9.0* 8.6*  HCT 26.3* 27.4* 26.9*   BMET Recent Labs    04/05/24 0422 04/06/24 0927  NA 140 141  K 4.3 3.9  CL 109 109  CO2 23 24  GLUCOSE 107* 130*  BUN 23 18  CREATININE 2.10* 1.77*  CALCIUM  8.5* 8.4*     Studies/Results: No results found.   Assessment/Plan: 63 year old male with grade 5 prostate cancer, anoxic brain injury, gross hematuria and bilateral ureteral and renal stone. POD4 s/p cystoscopy with clot evac and bilateral ureteral stents   - TOV timed for the morning of 04/07/2024. - Definitive stone management on an outpatient basis   LOS: 3 days   Lonni Han, MD [facility] Pager: [phone]  04/06/2024, 2:13 PM

## 2024-04-06 NOTE — Evaluation (Signed)
 Splint not provided at bedside, unable to apply at HS.

## 2024-04-06 NOTE — Progress Notes (Signed)
 HD#4 SUBJECTIVE:  Patient Summary: Antonio Navarro is a 63 y.o. 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   Overnight Events and Interim History:  With nods and shakes, he denies acute concerns, pain, lightheadedness, fever, chills. Today nods yes when asked if he feels better than when he came into the hospital. He does not have an appetite this morning.   Significant Hospital Events:  8/15 - Cystoscopy with bilateral ureteral stent placement and clot evacuation 8/17 - am HG 8.4 -> 6.0, 2u PRBC ordered  OBJECTIVE:  Vital Signs: Vitals:   04/06/24 0342 04/06/24 0752 04/06/24 0959 04/06/24 1000  BP: (!) 145/100 (!) 124/93 (!) 126/96 (!) 126/96  Pulse: 94 80 92 92  Resp: 20 12 20    Temp: 98.9 F (37.2 C) 98 F (36.7 C)    TempSrc: Oral Axillary    SpO2: 100% 100% 100%   Weight: 59.2 kg     Height:       Supplemental O2: Room Air SpO2: 100 % O2 Flow Rate (L/min): 0 L/min  Filed Weights   04/04/24 0500 04/05/24 0251 04/06/24 0342  Weight: 56.2 kg 59.2 kg 59.2 kg    Intake/Output Summary (Last 24 hours) at 04/06/2024 1057 Last data filed at 04/06/2024 0750 Gross per 24 hour  Intake 1720 ml  Output 1000 ml  Net 720 ml   Net IO Since Admission: -31,085 mL [04/06/24 1057]  Physical Exam: Physical Exam Constitutional:      Appearance: Chronically ill, cachectic man laying in bed in NAD. Alert and follows requests. HENT:     Mouth/Throat:     Mouth: Mucous membranes are dry.  Cardiovascular:     Rate and Rhythm: Regular rhythm. Tachycardia present.  Pulmonary:     Effort: No respiratory distress.  Abdominal:     General: Abdomen  is flat. Bowel sounds are normal. There is no distension.     Palpations: Abdomen is soft.  Skin:    General: Skin is warm and dry.  Neurological:     Mental Status: Mental status is at baseline.  Psychiatric:        Mood and Affect: Mood normal.        Behavior: Behavior normal.   Patient Lines/Drains/Airways Status     Active Line/Drains/Airways     Name Placement date Placement time Site Days   Peripheral IV 04/02/24 20 G 2.5 Left Antecubital 04/02/24  1449  Antecubital  2   Urethral Catheter Dr. Devere Latex 20 Fr. 04/02/24  2250  Latex  2   Ureteral Drain/Stent Right ureter 6 Fr. 04/02/24  2238  Right ureter  2   Ureteral Drain/Stent Left ureter 6 Fr. 04/02/24  2245  Left ureter  2            ASSESSMENT/PLAN:  Assessment: Principal Problem:   Urolithiasis Active Problems:   Prostate cancer (HCC)   Cardiomyopathy, ischemic   History of anoxic brain injury   Incontinence of bowel   Incontinence of urine   CAD (coronary artery disease) of artery bypass graft   Dysphagia   History of kidney stones   UTI (urinary tract infection)   Requires assistance with all activities of daily living (ADL)  Cachexia (HCC)   Gross hematuria   Acute on chronic blood loss anemia   AKI (acute kidney injury) (HCC)   Nonverbal communication   Acute blood loss anemia   Protein-calorie malnutrition, severe  Antonio Navarro is a 63 y.o. 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   Plan: Acute on chronic blood loss anemia: hematuria 2/2 Bilateral nephrolithiasis, urolithiasis Urology clamped continued bladder irrigation. Blood cultures final growth Proteus mirabilis, on IV Ampicillin .  Urine cultures show insignificant growth with <10,000 colonies. Holding aspirin and Plavix in the acute setting given patient's bleeding. Urology will plan for definitive stone treatment in the next 2 to 3 weeks outpatient. Pt has received 3u PRBCS since admission. Pending further urology recs. Pt stable at present. - Urology following, will continue to follow recs - Dilaudid  q4h for pain management, he has not needed it - Hold ASA, Plavix - Trend CMP/CBC   UTI Leukocytosis Infectious encephalopathy Proteus, Enterobacterales bactermia Initially more fatigued and less interactive with concern for acute encephalopathy. Likely 2/2 infection in setting of leukocytosis, worsening obstructive urolithiasis, and UA revealing of bacteria. Improving WBC today, afebrile. Blood cultures preliminary result showing gram-negative rods, positive for Proteus species. 4 doses of CTX given, now narrowing to IV ampicillin  2g. -IV Ampicillin  2g q8hr (Day 4/7 of abx)   AKI Secondary to above. Per other chart, patient baseline Scr is around 1.15.  On admission was 2.16.  Slight improvement so far with stents and fluids. -Trend CMP  Hx of anoxic brain injury Tongue/Lip Biting Dysphagia Cachexia At baseline, patient is non-ambulatory and fully dependent for ADL's, however will interact with those around him with facial expressions and is able to communicate with head nodding/shaking in response to yes/no questions. He is visibly frail, with cachetic appearance, likely multifactorial 2/2 to anoxic brain injury, immobility, sarcopenia, and poor oral intake. On dysphagia 3 diet per SLP. RD recommends full supervision with meals, addition of protein shakes, and small sips and bites of meals. - Continue diet, monitor for changes in appetite    HFrecEF Per EMR, patient has history of HFrecEF with most recent echo in 2022 showing EF 55-60%. Will hold GDMT currently in the setting of AKI and not wanting to blunt  tachycardia, as these findings are indicative of distress and pain in non-verbal patient.    Prostate cancer (clinical stage T3bN1) Patient is following with hematology/oncology physician Dr. Federico. He currently receives injections but no longer receives radiation treatments as of 01/2022.   Goals of Care [name] and [name] are his medical decision makers and opt for full code status at this time and are ok with patient receiving blood. Similar conversations documented in previous encounters in EMR. Palliative consulted to have continued GOC conversation with Adrienne, pending recommendations. PT recommends Hoyer lift, stair ramp/lift for home, and continued PT services. OT recommends B hand splints. Social work spoke with patient's fiancee, who requests Home Health options. Will order DME [facility] lift. - Pending Palliative consult for quality of life/GOC  Best Practice: Therapy Recs: Home with Hospice Family Contact: Fiance, sister DISPO: Anticipated discharge in 1 day to Home pending clinical improvement, stable Hg, urology workup.  Signature: Cypher Paule, DO Internal Medicine Resident, PGY-1 Please contact the on call pager at [phone] for  any urgent or emergent needs. 10:57 AM 04/06/2024

## 2024-04-06 NOTE — Plan of Care (Signed)
  Problem: Education: Goal: Knowledge of General Education information will improve Description: Including pain rating scale, medication(s)/side effects and non-pharmacologic comfort measures Outcome: Not Progressing   Problem: Health Behavior/Discharge Planning: Goal: Ability to manage health-related needs will improve Outcome: Not Progressing   Problem: Clinical Measurements: Goal: Ability to maintain clinical measurements within normal limits will improve Outcome: Not Progressing Goal: Will remain free from infection Outcome: Not Progressing Goal: Diagnostic test results will improve Outcome: Not Progressing Goal: Respiratory complications will improve Outcome: Not Progressing Goal: Cardiovascular complication will be avoided Outcome: Not Progressing   Problem: Activity: Goal: Risk for activity intolerance will decrease Outcome: Not Progressing   Problem: Nutrition: Goal: Adequate nutrition will be maintained Outcome: Not Progressing   Problem: Coping: Goal: Level of anxiety will decrease Outcome: Not Progressing   Problem: Elimination: Goal: Will not experience complications related to bowel motility Outcome: Not Progressing Goal: Will not experience complications related to urinary retention Outcome: Not Progressing   Problem: Pain Managment: Goal: General experience of comfort will improve and/or be controlled Outcome: Not Progressing   Problem: Safety: Goal: Ability to remain free from injury will improve Outcome: Not Progressing   Problem: Skin Integrity: Goal: Risk for impaired skin integrity will decrease Outcome: Not Progressing   Problem: Education: Goal: Knowledge of General Education information will improve Description: Including pain rating scale, medication(s)/side effects and non-pharmacologic comfort measures Outcome: Not Progressing   Problem: Health Behavior/Discharge Planning: Goal: Ability to manage health-related needs will  improve Outcome: Not Progressing   Problem: Clinical Measurements: Goal: Ability to maintain clinical measurements within normal limits will improve Outcome: Not Progressing Goal: Will remain free from infection Outcome: Not Progressing Goal: Diagnostic test results will improve Outcome: Not Progressing Goal: Respiratory complications will improve Outcome: Not Progressing Goal: Cardiovascular complication will be avoided Outcome: Not Progressing   Problem: Activity: Goal: Risk for activity intolerance will decrease Outcome: Not Progressing   Problem: Nutrition: Goal: Adequate nutrition will be maintained Outcome: Not Progressing   Problem: Coping: Goal: Level of anxiety will decrease Outcome: Not Progressing   Problem: Elimination: Goal: Will not experience complications related to bowel motility Outcome: Not Progressing Goal: Will not experience complications related to urinary retention Outcome: Not Progressing   Problem: Pain Managment: Goal: General experience of comfort will improve and/or be controlled Outcome: Not Progressing   Problem: Safety: Goal: Ability to remain free from injury will improve Outcome: Not Progressing   Problem: Skin Integrity: Goal: Risk for impaired skin integrity will decrease Outcome: Not Progressing

## 2024-04-06 NOTE — TOC Progression Note (Signed)
 Transition of Care (TOC) - Progression Note  Antonio Gobble RN,BSN Inpatient Care Management Unit 4NP (Non Trauma)- RN Case Manager See Treatment Team for direct Phone #   Patient Details  Name: Antonio Navarro MRN: [id number] Date of Birth: [birth year]  Transition of Care [facility]) CM/SW Contact  [name], RN Phone Number: 04/06/2024, 2:45 PM  Clinical Narrative:    Note DME order placed for [facility] lift.   CM follow up done with sister- Antonio Navarro, and SO-Antonio Navarro at bedside.   Pt transferred to 4NP08  Per discussion with sister and Antonio Navarro, they have [facility] list for review. CM will f/u closer to discharge to see if they have a preference for [facility] agency- current recommendations for HHPT/OT- will need orders prior to discharge in order to make [facility] referral.   Per Antonio Navarro- CAPS worker- Antonio Navarro has indicated that she will arrange for home hoyer lift w/ [employer]- request order be faxed to her at - [phone].  DME order faxed via epic as per request- CM will follow up with [employer] to make sure lift is being processed prior to discharge.   Cm will continue to follow    Expected Discharge Plan: Home w Home Health Services Barriers to Discharge: Continued Medical Work up               Expected Discharge Plan and Services   Discharge Planning Services: CM Consult Post Acute Care Choice: Durable Medical Equipment, Home Health Living arrangements for the past 2 months: Single Family Home                 DME Arranged: Other see comment (hoyer lift)                     Social Drivers of Health (SDOH) Interventions    Readmission Risk Interventions     No data to display

## 2024-04-06 NOTE — Progress Notes (Signed)
 Occupational Therapy Treatment Patient Details Name: Antonio Navarro MRN: [id number] DOB: [birth year] Today's Date: 04/06/2024   History of present illness Pt is a 63 y.o. male who presented 04/02/24 with family concern for UTI and AMS. Pt admitted with gross hematuria with acute blood loss anemia secondary to an obstructing left proximal ureteral stone. Imaging also suggestive of hemorrhage within the left renal pelvis and ureter and clot in the bladder. S/p cystoscopy with bil ureteral stent placement and clot evacuation 8/15. 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 Pt fitted for B hand splints, applied for comfort and to prevent worsening of B hand/wrist contractures. Pt L hand with moderate wrist flexion contracture, difficulty with B finger flexion. Pt nodded yes after application of hand splints when asked if they were comfortably. Pt went back to sleep and not about to participate further in therapy, family not present for splint wear schedule or don/doff instructions. Will return for further caregiver education, DC plan with HHOT still appropriate.       If plan is discharge home, recommend the following:  Two people to help with walking and/or transfers;Two people to help with bathing/dressing/bathroom;Assistance with cooking/housework;Assistance with feeding;Supervision due to cognitive status;Assist for transportation;Help with stairs or ramp for entrance;Direct supervision/assist for medications management   Equipment Recommendations  None recommended by OT    Recommendations for Other Services      Precautions / Restrictions  Precautions Precautions: Fall Recall of Precautions/Restrictions: Impaired Precaution/Restrictions Comments: watch HR; minimally verbal at baseline; pt with foley and continuous bladder irrigation, OK per RN to mobilize with irrigation in place. Restrictions Weight Bearing Restrictions Per Provider Order: No       Mobility Bed Mobility                    Transfers                         Balance                                           ADL either performed or assessed with clinical judgement   ADL                                              Extremity/Trunk Assessment Upper Extremity Assessment Upper Extremity Assessment: RUE deficits/detail;LUE deficits/detail RUE Deficits / Details: slight wrist contractures with digits stiff predominantly in extenstion. PROM of elbow and shoulder WFL. B hand splints fitted to Pt for comfort and to prevent worsening of contracture LUE Deficits / Details: PROM of elbow WFL, L shouder stiff beyond 80 deg PROM wrist contracted but able to do PROM to neutral. very light squeeze which seemed more like thumb adduction. pull back when extending wrist further past neutral. B hand splints fitted for comfort and to prevent worsening of contractures            Vision  Perception     Praxis     Communication Communication Communication: Impaired Factors Affecting Communication: Difficulty expressing self   Cognition Arousal: Lethargic Behavior During Therapy: Flat affect Cognition: No family/caregiver present to determine baseline, Difficult to assess Difficult to assess due to: Impaired communication           OT - Cognition Comments: nonverbal, difficulty to wake up, nodded yes/not a few times during session                 Following commands: Impaired Following commands impaired: Follows one step commands inconsistently      Cueing   Cueing Techniques: Verbal  cues, Gestural cues, Tactile cues, Visual cues  Exercises      Shoulder Instructions       General Comments Pt bed level, fitted for B hand splints, not able to participate further than this today.    Pertinent Vitals/ Pain       Pain Assessment Pain Assessment: Faces Faces Pain Scale: Hurts little more Pain Location: L wrist with passive wrist extension Pain Descriptors / Indicators: Aching, Grimacing Pain Intervention(s): Monitored during session  Home Living                                          Prior Functioning/Environment              Frequency  Min 1X/week        Progress Toward Goals  OT Goals(current goals can now be found in the care plan section)  Progress towards OT goals: Progressing toward goals  Acute Rehab OT Goals Patient Stated Goal: not able to participate OT Goal Formulation: Patient unable to participate in goal setting Time For Goal Achievement: 04/17/24 Potential to Achieve Goals: Fair ADL Goals Additional ADL Goal #1: Pt family will demonstrate understanding of splint use and wear schedule. Additional ADL Goal #2: Pt family will demonstrate proper positioning during transfers to reduce risk of caregiver injury. Additional ADL Goal #3: Pt will improve sitting tolerance for OOB to prevent risk of further debility and skin breakdown  Plan      Co-evaluation                 AM-PAC OT 6 Clicks Daily Activity     Outcome Measure   Help from another person eating meals?: Total Help from another person taking care of personal grooming?: Total Help from another person toileting, which includes using toliet, bedpan, or urinal?: Total Help from another person bathing (including washing, rinsing, drying)?: Total Help from another person to put on and taking off regular upper body clothing?: Total Help from another person to put on and taking off regular lower body clothing?: Total 6 Click Score: 6    End of  Session    OT Visit Diagnosis: Unsteadiness on feet (R26.81);Other abnormalities of gait and mobility (R26.89);Other symptoms and signs involving cognitive function   Activity Tolerance Patient limited by lethargy   Patient Left in bed;with call bell/phone within reach   Nurse Communication Mobility status        Time: 8492-8479 OT Time Calculation (min): 13 min  Charges: OT General Charges $OT Visit: 1 Visit OT Treatments $Therapeutic Activity: 8-22 mins  [address], OTR/L   Antonio Navarro 04/06/2024, 3:28 PM

## 2024-04-06 NOTE — Progress Notes (Signed)
 Pt transferred from 4E. PT is Ax0x1 and non verbal . PT VSS and  assessment completed. Pt was unable to get oriented to the due to neurological status but his family at bedside was oriented to the unit . Pt belongings are at the bedside with the patient. Pt sister and fiance are at the bedside. Call-bell with in reach and tele applied.    04/06/24 1441  Vitals  Temp 98.1 F (36.7 C)  Temp Source Axillary  BP (!) 143/90  MAP (mmHg) 106  BP Location Right Arm  BP Method Automatic  Patient Position (if appropriate) Lying  Pulse Rate 83  Pulse Rate Source Monitor  ECG Heart Rate 91  Resp 16  MEWS COLOR  MEWS Score Color Green  Oxygen Therapy  SpO2 99 %  O2 Device Room Air  MEWS Score  MEWS Temp 0  MEWS Systolic 0  MEWS Pulse 0  MEWS RR 0  MEWS LOC 0  MEWS Score 0

## 2024-04-06 NOTE — Discharge Instructions (Addendum)
 Antonio Navarro,  You were recently admitted to [facility] for kidney stones stuck in your left ureter causing an infection and blood clots. We gave you antibiotics while you were here and the urology team put stents in your ureters to keep them open to prevent the kidney stone from causing further damage.  Continue taking your home medications with the following changes:  Start taking Stop taking Continue taking   You should seek further medical care if you develop a fever over 101 F, have a change in your demeanor, or have your blood pressure consistently remain elevated over 180/100.  We recommend that you see your primary care doctor in about a week to make sure that you continue to improve.    We are so glad that you are feeling better.   Sincerely, Sonia Bromell, DO

## 2024-04-06 NOTE — Progress Notes (Signed)
 Patient is getting transferred to 4N08, report given to bedside RN, family at bedside, belongings packed in the bag, will be transferred with pt.

## 2024-04-06 NOTE — Consult Note (Incomplete)
 Palliative Care Consult Note                                  Date: 04/06/2024   Patient Name: Antonio Navarro  DOB: 1960/10/09  MRN: [id number]  Age / Sex: 63 y.o., male  PCP: Pcp, No Referring Physician: Francesco Elsie NOVAK, MD  Reason for Consultation: Establishing goals of care  HPI/Patient Profile: 63 y.o. male  with past medical history of anoxic brain injury secondary to cardiac arrest in 2021, STEMI, ischemic cardiomyopathy, prostate cancer (clinical stage T3b N1), dysphagia, GERD, HTN, and HLD who presented to the ED on 04/02/2024 with hematuria/clots with increasing fatigue and is admitted with obstructive urolithiasis and acute blood loss anemia.   Palliative Medicine has been consulted for goals of care discussions. Patient and family are faced with anticipatory care needs and complex medical decision making.   Clinical Assessment and Goals of Care:   Extensive chart review has been completed prior to meeting with patient/family including labs, vital signs, imaging, progress/consult notes, orders, medications and available advance directive documents.    I met with *** to discuss diagnosis, prognosis, GOC, EOL wishes, disposition, and options.  I introduced Palliative Medicine as specialized medical care for people living with serious illness. It focuses on providing relief from the symptoms and stress of a serious illness.   Created space and opportunity for patient and family to express thoughts and feelings regarding current medical situation. Values and goals of care were attempted to be elicited.  A discussion was had today regarding advanced directives. We discussed code status and scopes of care. We discussed the difference between full scope versus limited interventions versus comfort care. The MOST form was introduced and discussed.   Life Review: ***  Functional Status: ***  Patient/Family Understanding of  Illness: ***  Discussion: *** We discussed patient's current illness and what it means in the larger context of his ongoing co-morbidities. Current clinical status was reviewed. Natural disease trajectory of *** was discussed.  Discussed the importance of continued conversation with the medical team regarding overall plan of care and treatment options, ensuring decisions are within the context of the patients values and GOCs.  Questions and concerns addressed. Patient/family encouraged to call with questions or concerns.   Review of Systems  Unable to perform ROS   Objective:   Primary Diagnoses: Present on Admission:  Urolithiasis  Nonverbal communication   Physical Exam Vitals reviewed.  Constitutional:      General: He is awake. He is not in acute distress.    Comments: Chronically ill-appearing  Cardiovascular:     Rate and Rhythm: Normal rate.  Pulmonary:     Effort: Pulmonary effort is normal.  Neurological:     Motor: Weakness present.     Comments: Follows commands       Palliative Assessment/Data: ***     Assessment & Plan:   SUMMARY OF RECOMMENDATIONS   Continue full scope care Goal is for medical stabilization and to return home  Primary Decision Maker: {Primary Decision Fjxzm:78612}  Existing Vynca/ACP Documentation: None  Code Status/Advance Care Planning: Full code  Symptom Management:  ***  Prognosis:  {Palliative Care Prognosis:23504}  Discharge Planning:  {Palliative dispostion:23505}   Discussed with: ***    Thank you for allowing us  to participate in the care of Antonio Navarro   Time Total: ***  Greater than 50%  of this time was spent  counseling and coordinating care related to the above assessment and plan.  Signed by: Recardo Loll, NP Palliative Medicine Team  Team Phone # [phone]  For individual providers, please see AMION

## 2024-04-07 DIAGNOSIS — D649 Anemia, unspecified: Secondary | ICD-10-CM

## 2024-04-07 DIAGNOSIS — N179 Acute kidney failure, unspecified: Principal | ICD-10-CM

## 2024-04-07 DIAGNOSIS — G9349 Other encephalopathy: Secondary | ICD-10-CM

## 2024-04-07 DIAGNOSIS — Z7409 Other reduced mobility: Secondary | ICD-10-CM

## 2024-04-07 DIAGNOSIS — N202 Calculus of kidney with calculus of ureter: Principal | ICD-10-CM

## 2024-04-07 DIAGNOSIS — Z792 Long term (current) use of antibiotics: Secondary | ICD-10-CM

## 2024-04-07 DIAGNOSIS — Z87820 Personal history of traumatic brain injury: Secondary | ICD-10-CM

## 2024-04-07 DIAGNOSIS — B964 Proteus (mirabilis) (morganii) as the cause of diseases classified elsewhere: Secondary | ICD-10-CM

## 2024-04-07 DIAGNOSIS — C61 Malignant neoplasm of prostate: Secondary | ICD-10-CM

## 2024-04-07 LAB — URINE CULTURE: Culture: 20000 — AB

## 2024-04-07 LAB — BASIC METABOLIC PANEL WITH GFR
Anion gap: 9 (ref 5–15)
BUN: 16 mg/dL (ref 8–23)
CO2: 24 mmol/L (ref 22–32)
Calcium: 8.2 mg/dL — ABNORMAL LOW (ref 8.9–10.3)
Chloride: 107 mmol/L (ref 98–111)
Creatinine, Ser: 1.81 mg/dL — ABNORMAL HIGH (ref 0.61–1.24)
GFR, Estimated: 42 mL/min — ABNORMAL LOW (ref >60)
Glucose, Bld: 116 mg/dL — ABNORMAL HIGH (ref 70–99)
Potassium: 3.8 mmol/L (ref 3.5–5.1)
Sodium: 140 mmol/L (ref 135–145)

## 2024-04-07 LAB — CBC
HCT: 25.7 % — ABNORMAL LOW (ref 39.0–52.0)
Hemoglobin: 8.4 g/dL — ABNORMAL LOW (ref 13.0–17.0)
MCH: 30 pg (ref 26.0–34.0)
MCHC: 32.7 g/dL (ref 30.0–36.0)
MCV: 91.8 fL (ref 80.0–100.0)
Platelets: 164 K/uL (ref 150–400)
RBC: 2.8 MIL/uL — ABNORMAL LOW (ref 4.22–5.81)
RDW: 14.7 % (ref 11.5–15.5)
WBC: 8.2 K/uL (ref 4.0–10.5)
nRBC: 0 % (ref 0.0–0.2)

## 2024-04-07 MED ORDER — AMOXICILLIN-POT CLAVULANATE 875-125 MG PO TABS
1.0000 | ORAL_TABLET | Freq: Two times a day (BID) | ORAL | Status: DC
  Administered 2024-04-07 – 2024-04-08 (×2): 1 via ORAL
  Filled 2024-04-07 (×2): qty 1

## 2024-04-07 NOTE — Progress Notes (Addendum)
 5 Days Post-Op Subjective: Patient remained hematuria free overnight.  Will plan for voiding trial tomorrow morning.  NAEON.  Objective: Vital signs in last 24 hours: Temp:  [97.7 F (36.5 C)-99.4 F (37.4 C)] 97.9 F (36.6 C) (08/20 1144) Pulse Rate:  [82-97] 82 (08/20 0743) Resp:  [14-20] 14 (08/20 0743) BP: (133-145)/(87-96) 141/96 (08/20 0743) SpO2:  [97 %-99 %] 97 % (08/20 0743) Weight:  [62.7 kg] 62.7 kg (08/20 0750)  Intake/Output from previous day: 08/19 0701 - 08/20 0700 In: 1200 [P.O.:100; IV Piggyback:100] Out: 2700 [Urine:2700]  Intake/Output this shift: No intake/output data recorded.  Physical Exam:  General: Patient is responsive to verbal stimuli with no indication of pain or discomfort GU: Foley removed.  Condom catheter.  Clear yellow urine  Lab Results: Recent Labs    04/05/24 0422 04/06/24 0927 04/07/24 0513  HGB 9.0* 8.6* 8.4*  HCT 27.4* 26.9* 25.7*   BMET Recent Labs    04/06/24 0927 04/07/24 0513  NA 141 140  K 3.9 3.8  CL 109 107  CO2 24 24  GLUCOSE 130* 116*  BUN 18 16  CREATININE 1.77* 1.81*  CALCIUM  8.4* 8.2*     Studies/Results: No results found.   Assessment/Plan: 63 year old male with grade 5 prostate cancer, anoxic brain injury, gross hematuria and bilateral ureteral and renal stone. POD5 s/p cystoscopy with clot evac and bilateral ureteral stents   - Successful voiding trial today.  Hematuria has resolved.  Catheter removed before my arrival, condom catheter placed.  Patient voiding clear yellow urine while I was in the room. -Reviewed case and plan with his daughter.  They are established with our practice and she will call for follow-up if she is not contacted by the clinic within a few days. -Definitive stone management on an outpatient basis -Okay to discharge once medically clear.  Urology will sign over this time.  Please call with questions.    LOS: 4 days   Lonni Han, MD [facility] Pager: [phone]  04/07/2024, 1:54 PM

## 2024-04-07 NOTE — Plan of Care (Signed)

## 2024-04-07 NOTE — Progress Notes (Signed)
 Occupational Therapy Treatment Patient Details Name: Quin Mathenia MRN: [id number] DOB: [birth year] Today's Date: 04/07/2024   History of present illness Pt is a 63 y.o. male who presented 04/02/24 with family concern for UTI and AMS. Pt admitted with gross hematuria with acute blood loss anemia secondary to an obstructing left proximal ureteral stone. Imaging also suggestive of hemorrhage within the left renal pelvis and ureter and clot in the bladder. S/p cystoscopy with bil ureteral stent placement and clot evacuation 8/15. 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 Pt fiance [name] (caregiver) present and OT provided splint donning/doffing education, skin checks; caregiver demos understanding. OT donned slpints ~10:30 a.m. pt denies any pain/discomfort and OT removed at ~12:55 pm still in appropriate fit/position. Pt without signs of pressure/redness or discomfort. Pt asked if in any pain and he shook his head no. OT will continue to follow acutely for pt/family B UE splinting education      If plan is discharge home, recommend the following:  Two people to help with walking and/or transfers;Two people to help with bathing/dressing/bathroom;Assistance with cooking/housework;Assistance with feeding;Supervision due to cognitive status;Assist for transportation;Help with stairs or ramp for entrance;Direct supervision/assist for medications management   Equipment Recommendations  None recommended by OT    Recommendations for Other Services      Precautions / Restrictions Precautions Precautions: Fall Recall of Precautions/Restrictions: Impaired Precaution/Restrictions Comments: watch HR; minimally verbal at baseline; pt with  foley and continuous bladder irrigation, OK per RN to mobilize with irrigation in place.       Mobility Bed Mobility                    Transfers                         Balance                                           ADL either performed or assessed with clinical judgement   ADL                                         General ADL Comments: Pt is total A for all ADLs, that is also his baseline    Extremity/Trunk Assessment Upper Extremity Assessment Upper Extremity Assessment: Generalized weakness;RUE deficits/detail;LUE deficits/detail RUE Deficits / Details: slight wrist contractures with digits stiff predominantly in extenstion. PROM of elbow and shoulder WFL. B hand splints fitted to Pt for comfort and to prevent worsening of contracture LUE Deficits / Details: PROM of elbow WFL, L shouder stiff beyond 80 deg PROM wrist contracted but able to do PROM to neutral. very light squeeze which seemed more like thumb adduction. pull back when extending wrist further past neutral. B hand splints fitted for comfort and to prevent worsening of contractures            Vision Patient Visual Report: No change from baseline     Perception     Praxis     Communication Communication Communication: Impaired Factors  Affecting Communication: Difficulty expressing self   Cognition Arousal: Alert Behavior During Therapy: Flat affect   Difficult to assess due to: Impaired communication           OT - Cognition Comments: nonverbal                 Following commands: Impaired Following commands impaired: Follows one step commands inconsistently      Cueing   Cueing Techniques: Verbal cues, Gestural cues, Tactile cues, Visual cues  Exercises Other Exercises Other Exercises: PROM/gentle stretch to B UEs, focusing on hands/wrists. Pt fiance [name] (caregiver) present and OT provided splint donning/doffing education, skin  checks; caregiver demos understanding. OT donned slpints ~10:30 a.m. pt denies any pain/discomfort and removed at ~12:55 pm still in appropriate fit/position. Pt without signs of pressure/redness or discomfort. Pt asked if in any pain and he shook his head no    Shoulder Instructions       General Comments      Pertinent Vitals/ Pain       Pain Assessment Pain Assessment: Faces Faces Pain Scale: Hurts a little bit Pain Location: L wrist with passive wrist extension Pain Intervention(s): Monitored during session  Home Living                                          Prior Functioning/Environment              Frequency           Progress Toward Goals  OT Goals(current goals can now be found in the care plan section)  Progress towards OT goals: Progressing toward goals     Plan      Co-evaluation                 AM-PAC OT 6 Clicks Daily Activity     Outcome Measure   Help from another person eating meals?: Total Help from another person taking care of personal grooming?: Total Help from another person toileting, which includes using toliet, bedpan, or urinal?: Total Help from another person bathing (including washing, rinsing, drying)?: Total Help from another person to put on and taking off regular upper body clothing?: Total Help from another person to put on and taking off regular lower body clothing?: Total 6 Click Score: 6    End of Session Equipment Utilized During Treatment: Other (comment) (B hand splints)  OT Visit Diagnosis: Unsteadiness on feet (R26.81);Other abnormalities of gait and mobility (R26.89);Other symptoms and signs involving cognitive function   Activity Tolerance Patient tolerated treatment well   Patient Left in bed;with call bell/phone within reach;with family/visitor present   Nurse Communication Other (comment) (splinting)        Time: 8981-8960 OT Time Calculation (min): 21 min  Charges: OT  General Charges $OT Visit: 1 Visit OT Treatments $Therapeutic Activity: 8-22 mins    Jacques Karna Loose 04/07/2024, 1:34 PM

## 2024-04-07 NOTE — Progress Notes (Signed)
 HD#4 SUBJECTIVE:  Patient Summary: Antonio Navarro is a 63 y.o. 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  Since admission he has been hemodynamically stable and has not shown signs of worsening infection although has continued to have positive urine cultures for Proteus.  Urology has been following and we are doing a trial of void today.  After hemoglobin drop to 6.0 on 8/17 and transfusion of 2 units of packed red blood cells his hemoglobin has been stable and is 8.4 this morning.  Overnight Events and Interim History:   No significant overnight events.  Patient seen today laying in bed before catheter removal.  No elicited concerns and patient appears stable.  OBJECTIVE:  Vital Signs: Vitals:   04/06/24 2304 04/07/24 0256 04/07/24 0257 04/07/24 0505  BP: 135/87 (!) 145/96  (!) 139/90  Pulse: 83 97    Resp: 14 20    Temp: 99.4 F (37.4 C) 98.4 F (36.9 C) 98.4 F (36.9 C)   TempSrc: Axillary Oral Oral   SpO2: 98% 98%    Weight:      Height:       Supplemental O2: Room Air SpO2: 100 % O2 Flow Rate (L/min): 0 L/min  Filed Weights   04/04/24 0500 04/05/24 0251 04/06/24 0342  Weight: 56.2 kg 59.2 kg 59.2 kg    Intake/Output Summary (Last 24 hours) at 04/07/2024 9277 Last data filed at 04/07/2024 0303 Gross per 24 hour  Intake 1200 ml  Output 2700 ml  Net -1500 ml   Net IO Since Admission: -32,685 mL [04/07/24 0722]  Physical Exam: Constitutional: Chronically ill-appearing male laying in bed. In no acute distress. Cardio:Regular rate and rhythm. 2+ bilateral radial pulses. Pulm:Clear to auscultation  bilaterally in the anterior fields. Normal work of breathing on room air. Abdomen: Soft, non-tender, non-distended, positive bowel sounds. FDX:Wzhjupcz for extremity edema. Skin:Warm and dry. Neuro: Alert, unable to answer orientation questions.  Nods yes or no to some questions but otherwise unable to converse. Psych:Pleasant mood and affect.   ASSESSMENT/PLAN:  Assessment: Principal Problem:   Urolithiasis Active Problems:   Prostate cancer (HCC)   Cardiomyopathy, ischemic   History of anoxic brain injury   Incontinence of bowel   Incontinence of urine   CAD (coronary artery disease) of artery bypass graft   Dysphagia   History of kidney stones   UTI (urinary tract infection)   Requires assistance with all activities of daily living (ADL)   Cachexia (HCC)   Gross hematuria   Acute on chronic blood loss anemia   AKI (acute kidney injury) (HCC)   Nonverbal communication   Acute blood loss anemia   Protein-calorie malnutrition, severe  Antonio Navarro is a 63 y.o. 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  Plan: Proteus bacteremia 2/2 UTI with bilateral nephrolithiasis Acute on chronic blood loss anemia 2/2 hematuria Stable hemoglobin and no recurrent gross hematuria after  completing bladder irrigation.  Will proceed with trial of void this morning.  Urine culture continues to be positive for Proteus mirabilis susceptible to ampicillin  just like blood culture from 8/15.  Due to persistent positive culture possibly due to infected stone/stent we will extend antibiotic duration until definitive stone treatment is  completed or fully planned outpatient. - Continue IV ampicillin , day 6 - Trial of void today - Continue to hold antiplatelets   AKI Baseline creatinine appears to be around 1.1.  Creatinine 2.1 on admission and down to 1.8 today.  With removal of Foley catheter and improvement of hematuria expect this to continue to improve. - BMP in the morning  Hx of anoxic brain injury Tongue/Lip Biting Dysphagia Cachexia At baseline, patient is non-ambulatory and fully dependent for ADL's, however will interact with those around him with facial expressions and is able to communicate with head nodding/shaking in response to yes/no questions. He is visibly frail, with cachetic appearance, likely multifactorial 2/2 to anoxic brain injury, immobility, sarcopenia, and poor oral intake. On dysphagia 3 diet per SLP. RD recommends full supervision with meals, addition of protein shakes, and small sips and bites of meals. - Continue diet, monitor for changes in appetite    HFrecEF Per EMR, patient has history of HFrecEF with most recent echo in 2022 showing EF 55-60%. Will hold GDMT currently in the setting of AKI and not wanting to blunt tachycardia, as these findings are indicative of distress and pain in non-verbal patient.    Prostate cancer (clinical stage T3bN1) Patient is following with hematology/oncology physician Dr. Federico. He currently receives injections but no longer receives radiation treatments as of 01/2022.   Goals of Care [name] and [name] are his medical decision makers.  After further discussions with palliative medicine they are leaning towards changing to DNR but would like to discuss it further with their family before making the change in the chart.  We appreciate palliative medicine's help with this difficult discussions.    Best Practice: Therapy Recs: Home health, ordered Hoyer lift Family Contact: Fiance, sister DISPO: Anticipated discharge in 1-2 days to Home pending no recurrent  infectious symptoms, and successful trial of void  Signature: Fairy Pool, DO Internal Medicine Resident, PGY-3 Please contact the on call pager at [phone] for any urgent or emergent needs. 7:22 AM 04/07/2024

## 2024-04-08 LAB — CBC
HCT: 23.9 % — ABNORMAL LOW (ref 39.0–52.0)
Hemoglobin: 7.6 g/dL — ABNORMAL LOW (ref 13.0–17.0)
MCH: 29.5 pg (ref 26.0–34.0)
MCHC: 31.8 g/dL (ref 30.0–36.0)
MCV: 92.6 fL (ref 80.0–100.0)
Platelets: 149 K/uL — ABNORMAL LOW (ref 150–400)
RBC: 2.58 MIL/uL — ABNORMAL LOW (ref 4.22–5.81)
RDW: 14.5 % (ref 11.5–15.5)
WBC: 7.3 K/uL (ref 4.0–10.5)
nRBC: 0 % (ref 0.0–0.2)

## 2024-04-08 LAB — BASIC METABOLIC PANEL WITH GFR
Anion gap: 11 (ref 5–15)
BUN: 15 mg/dL (ref 8–23)
CO2: 25 mmol/L (ref 22–32)
Calcium: 8.4 mg/dL — ABNORMAL LOW (ref 8.9–10.3)
Chloride: 108 mmol/L (ref 98–111)
Creatinine, Ser: 1.76 mg/dL — ABNORMAL HIGH (ref 0.61–1.24)
GFR, Estimated: 43 mL/min — ABNORMAL LOW (ref >60)
Glucose, Bld: 98 mg/dL (ref 70–99)
Potassium: 3.6 mmol/L (ref 3.5–5.1)
Sodium: 144 mmol/L (ref 135–145)

## 2024-04-08 MED ORDER — AMOXICILLIN 500 MG PO CAPS
1000.0000 mg | ORAL_CAPSULE | Freq: Three times a day (TID) | ORAL | Status: DC
  Administered 2024-04-08: 1000 mg via ORAL
  Filled 2024-04-08 (×2): qty 2

## 2024-04-08 MED ORDER — ENSURE PLUS HIGH PROTEIN PO LIQD: 1.0000 | Freq: Two times a day (BID) | ORAL | 0 refills | Status: DC

## 2024-04-08 MED ORDER — AMOXICILLIN 500 MG PO CAPS: 1000.0000 mg | ORAL_CAPSULE | Freq: Three times a day (TID) | ORAL | 0 refills | Status: AC

## 2024-04-08 NOTE — TOC Transition Note (Addendum)
 Transition of Care (TOC) - Discharge Note Rayfield Gobble RN,BSN Inpatient Care Management Unit 4NP (Non Trauma)- RN Case Manager See Treatment Team for direct Phone #   Patient Details  Name: Antonio Navarro MRN: [id number] Date of Birth: Jan 15, 1961  Transition of Care [facility]) CM/SW Contact:  Gobble Rayfield Hurst, RN Phone Number: 04/08/2024, 2:23 PM   Clinical Narrative:    Per attending team Pt stable for transition home and plan to d/c today, CM spoke with fiance [name] at the bedside. Confirmed HH and DME arragnements.  [name] voiced she is agreeable to [facility] for HHPT/OT.  [facility] will reach out to [facility] for hoyer lift delivery needs.  Discussed transportation plan- per [name] they will likely transport home in private vehicle if the aide is available to assist. CM will follow up with [name] once d/c order placed.  Address confirmed with [name] in case they decide to use EMS transport.   Call made to [employer] liaison- Mitch for DME- hoyer lift- per [place] the order is not showing in system yet- they will go ahead and pull order from epic and contact [name] to set up delivery.  1330- update [employer] has reached out to [name]- she has requested delivery for tomorrow.   1400- d/c order in, call made to room to check on transportation plan, pt's sister- [name] answered phone- voiced [name] has gone home to get clothes. [name] to call [name] to see what plan is for transport.  [facility] liaison notified of discharge for start of care.  Return call to [name] and she confirmed with [name] that plan is for them to transport pt home in private car, not EMS, they will have aide assist at home. She voiced that this is how they normal transport pt to doctor's appointments.   Bedside RN updated on transport plan.   No further IP CM needs noted.   Update 1500- notified by bedside RN that family has decided on EMS transport home-  1530- [facility] called for transport- per dispatch ETA for pickup is an hour or so ballpark (may be  longer). Bedside RN updated and paperwork for transport placed on chart.    Final next level of care: Home w Home Health Services Barriers to Discharge: Barriers Resolved   Patient Goals and CMS Choice Patient states their goals for this hospitalization and ongoing recovery are:: return home CMS Medicare.gov Compare Post Acute Care list provided to:: Patient Represenative (must comment) Choice offered to / list presented to : Sibling (SO)      Discharge Placement               Home w/ [facility]        Discharge Plan and Services Additional resources added to the After Visit Summary for     Discharge Planning Services: CM Consult Post Acute Care Choice: Durable Medical Equipment, Home Health          DME Arranged: Other see comment (hoyer lift) DME Agency: [facility] Date DME Agency Contacted: 04/08/24 Time DME Agency Contacted: 1130 Representative spoke with at DME Agency: Mitch HH Arranged: PT, OT HH Agency: [facility] Date [facility] Agency Contacted: 04/07/24 Time HH Agency Contacted: 1600 Representative spoke with at [facility] Agency: [facility]  Social Drivers of Health (SDOH) Interventions     Readmission Risk Interventions     No data to display

## 2024-04-08 NOTE — Progress Notes (Signed)
 Physical Therapy Treatment Patient Details Name: Antonio Navarro MRN: [id number] DOB: Nov 02, 1960 Today's Date: 04/08/2024   History of Present Illness Pt is a 63 y.o. male who presented 04/02/24 with family concern for UTI and AMS. Pt admitted with gross hematuria with acute blood loss anemia secondary to an obstructing left proximal ureteral stone. Imaging also suggestive of hemorrhage within the left renal pelvis and ureter and clot in the bladder. S/p cystoscopy with bil ureteral stent placement and clot evacuation 8/15. 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 Yes/no head nods/shakes did not appear to be reliable. Pt actively participated with bed mobility and sit to stand transfers x 3 reps (requires +2 mod assist to come to stand). Pt actively returning himself to supine and required only mod assist to guide torso and finish lifting legs up onto bed. Re-positioned upright in bed with pillows for pressure-relief and comfort.     If plan is discharge home, recommend the following: Two people to help with walking and/or transfers;A lot of help with bathing/dressing/bathroom;Assistance with cooking/housework;Assistance with feeding;Direct supervision/assist for medications management;Direct supervision/assist for financial management;Assist for transportation;Help with stairs or ramp for entrance;Supervision due to cognitive status   Can travel by private vehicle        Equipment Recommendations  [place] lift;Other (comment) (ramp for home access)    Recommendations for Other Services       Precautions / Restrictions Precautions Precautions: Fall Recall of Precautions/Restrictions: Impaired Precaution/Restrictions Comments: watch HR; minimally verbal at  baseline; pt withcondom catheter Restrictions Weight Bearing Restrictions Per Provider Order: No     Mobility  Bed Mobility Overal bed mobility: Needs Assistance Bed Mobility: Sit to Sidelying, Supine to Sit     Supine to sit: Max assist, HOB elevated   Sit to sidelying: +2 for physical assistance, Mod assist General bed mobility comments: After standing x3 reps, pt began lying down to his left side and even trying to raise his legs up onto bed; assisted to lie down and reposition upright in bed    Transfers Overall transfer level: Needs assistance Equipment used: 2 person hand held assist Transfers: Sit to/from Stand Sit to Stand: Mod assist, +2 physical assistance           General transfer comment: pt engaged in standing x 3 reps on command; achieves nearly upright with flexed neck, hips, and knees; unable to side step toward his left/HOB    Ambulation/Gait             Pre-gait activities: even with mod assist to wt-shift, unable to advance either foot     Stairs             Wheelchair Mobility     Tilt Bed    Modified Rankin (Stroke Patients Only)       Balance Overall balance assessment: Needs assistance Sitting-balance support: Feet supported Sitting balance-Leahy Scale: Poor Sitting balance - Comments: progressed from min assist to close-guarding for up to 15 seconds; varied as fatigued with up to mod assist Postural control: Left lateral lean Standing balance support: Bilateral upper extremity supported Standing balance-Leahy Scale: Poor                              Communication Communication Communication:  Impaired Factors Affecting Communication: Difficulty expressing self  Cognition Arousal: Alert Behavior During Therapy: Flat affect   PT - Cognitive impairments: History of cognitive impairments, Initiation, Sequencing, Problem solving, Safety/Judgement, Attention, Awareness, Difficult to assess Difficult to assess  due to: Impaired communication                     PT - Cognition Comments: Patient inconsistently using head nods and shakes--unsure if reliable. Following some simple commands (raise LUE, squeeze with bil hands, come to stand) Following commands: Impaired Following commands impaired: Follows one step commands inconsistently    Cueing Cueing Techniques: Verbal cues, Gestural cues, Tactile cues, Visual cues  Exercises Other Exercises Other Exercises: PROM/gentle stretch to B UEs, focusing on elbows.    General Comments        Pertinent Vitals/Pain Pain Assessment Pain Assessment: Faces Faces Pain Scale: No hurt    Home Living                          Prior Function            PT Goals (current goals can now be found in the care plan section) Acute Rehab PT Goals Patient Stated Goal: did not state; wife desires for whatever is best for pt Time For Goal Achievement: 04/17/24 Potential to Achieve Goals: Fair Progress towards PT goals: Progressing toward goals    Frequency    Min 2X/week      PT Plan      Co-evaluation              AM-PAC PT 6 Clicks Mobility   Outcome Measure  Help needed turning from your back to your side while in a flat bed without using bedrails?: A Lot Help needed moving from lying on your back to sitting on the side of a flat bed without using bedrails?: Total (+2 assist, pt not able to help) Help needed moving to and from a bed to a chair (including a wheelchair)?: Total Help needed standing up from a chair using your arms (e.g., wheelchair or bedside chair)?: Total Help needed to walk in hospital room?: Total Help needed climbing 3-5 steps with a railing? : Total 6 Click Score: 7    End of Session Equipment Utilized During Treatment: Gait belt Activity Tolerance: Patient tolerated treatment well Patient left: in bed;with call bell/phone within reach;with bed alarm set;with SCD's reapplied (heels floated, HOB  ~45 deg, SCD replaced)   PT Visit Diagnosis: Unsteadiness on feet (R26.81);Other abnormalities of gait and mobility (R26.89);Muscle weakness (generalized) (M62.81);Difficulty in walking, not elsewhere classified (R26.2)     Time: 9072-9056 PT Time Calculation (min) (ACUTE ONLY): 16 min  Charges:    $Therapeutic Activity: 8-22 mins PT General Charges $$ ACUTE PT VISIT: 1 Visit                      Antonio RAMAN, PT Acute Rehabilitation Services  Office [phone]    Antonio Navarro 04/08/2024, 10:07 AM

## 2024-04-08 NOTE — TOC Progression Note (Addendum)
 Transition of Care (TOC) - Progression Note  Rayfield Gobble RN,BSN Inpatient Care Management Unit 4NP (Non Trauma)- RN Case Manager See Treatment Team for direct Phone #   Patient Details  Name: Antonio Navarro MRN: [id number] Date of Birth: Jan 09, 1961  Transition of Care [facility]) CM/SW Contact  Gobble Rayfield Hurst, RN Phone Number: 04/08/2024, 12:47 PM  Clinical Narrative:    HH orders placed with tentative plan for transition home tomorrow.   As previously discussed with pt's fiance and sister they did not have any preference for [facility] provider- CM placed calls to the following to see if services could be secured.  [facility])- unable to accept insurance at this time [facility]- Marketing executive) do not contract w/ insurance [facility])- unable to accept insurance at this time [facility])- have accepted referral and confirmed they can provide HHPT/OT.   CM will follow up in am with fiance-[name] and/or sister at the bedside.   Per chart- pt's PCP is [facility]- Fairy Pool.   CM will check with [name] or [name] regarding plan for transportation home.    Expected Discharge Plan: Home w Home Health Services Barriers to Discharge: Barriers Resolved               Expected Discharge Plan and Services   Discharge Planning Services: CM Consult Post Acute Care Choice: Durable Medical Equipment, Home Health Living arrangements for the past 2 months: Single Family Home                 DME Arranged: Other see comment (hoyer lift) DME Agency: [facility] Date DME Agency Contacted: 04/08/24 Time DME Agency Contacted: 1130 Representative spoke with at DME Agency: Mitch HH Arranged: PT, OT HH Agency: [facility] Date [facility] Agency Contacted: 04/07/24 Time HH Agency Contacted: 1600 Representative spoke with at [facility] Agency: [facility]   Social Drivers of Health (SDOH) Interventions    Readmission Risk Interventions     No data to display

## 2024-04-08 NOTE — Discharge Summary (Signed)
 Name: Antonio Navarro MRN: [id number] DOB: [birth year] 63 y.o. PCP: Pcp, No  Date of Admission: 04/02/2024  1:02 PM Date of Discharge: 04/08/2024  Attending Physician: Dr. MICAEL Riis Winfrey  Discharge Diagnosis: Principal Problem:   Urolithiasis Active Problems:   Prostate cancer (HCC)   Cardiomyopathy, ischemic   History of anoxic brain injury   Incontinence of bowel   Incontinence of urine   CAD (coronary artery disease) of artery bypass graft   Dysphagia   History of kidney stones   UTI (urinary tract infection)   Requires assistance with all activities of daily living (ADL)   Cachexia (HCC)   Gross hematuria   Acute on chronic blood loss anemia   AKI (acute kidney injury) (HCC)   Nonverbal communication   Acute blood loss anemia   Protein-calorie malnutrition, severe   Symptomatic anemia   Acute kidney injury (HCC)    Discharge Medications: Allergies as of 04/08/2024   No Known Allergies      Medication List     TAKE these medications    amoxicillin  500 MG capsule Commonly known as: AMOXIL  Take 2 capsules (1,000 mg total) by mouth 3 (three) times daily for 16 days.   aspirin EC 81 MG tablet Take 81 mg by mouth daily. Swallow whole.   atorvastatin  80 MG tablet Commonly known as: LIPITOR  Take 80 mg by mouth daily.   clopidogrel 75 MG tablet Commonly known as: PLAVIX Take 75 mg by mouth daily.   feeding supplement Liqd Take 237 mLs by mouth 2 (two) times daily between meals.   losartan 25 MG tablet Commonly known as: COZAAR Take 25 mg by mouth daily.   metoprolol  tartrate 25 MG tablet Commonly known as: LOPRESSOR  Take 12.5 mg by mouth 2 (two) times daily.   ondansetron  4 MG disintegrating tablet Commonly known as: ZOFRAN -ODT Take 4 mg by mouth every 8 (eight) hours as needed for nausea or vomiting.   Xtandi  40 MG tablet Generic drug: enzalutamide  Take 40 mg by mouth in the morning, at noon, in the evening, and at bedtime.                Durable Medical Equipment  (From admission, onward)           Start     Ordered   04/06/24 1056  For home use only DME Other see comment  Once       Comments: Deitra  Question:  Length of Need  Answer:  Lifetime   04/06/24 1055            Disposition and follow-up:   Antonio Navarro was discharged from [facility] in Stable condition.  At the hospital follow up visit please address:  1.  Follow-up:  a.  Patient was found to have an AKI, with creatinine of 2.1 on admission that improved to 1.76 on discharge.  Please follow-up creatinine in the outpatient setting to ensure appropriate downtrend.  Additionally, patient was found to have hematuria and clots in his urine.  Please follow-up with UA to ensure no new changes or worsening of his infection.    b.  Urology recommended that patient remain on oral antibiotics until definitive stone management was achieved.  As of discharge, patient does not have a scheduled appointment with urology; however they are anticipating a call from the office to have that set up within the next few days.  Patient and his fiance know to call the urology office if they have not  heard from them in the few days.  Please ensure a urology appointment is set up by by the time the patient is able to follow-up with his PCP in clinic.   c.  PT assessed patient while admitted, recommended [facility] lift and a ramp for home access.  Please follow-up with patient's fianc to ensure appropriate orders were sent and established at the house.  2.  Labs / imaging needed at time of follow-up: CBC and BMP, UA  Follow-up Appointments:  Follow-up Information     Devere Lonni Righter, MD. Call in 2 week(s).   Specialty: Urology Contact information: [address] [place] [address] [phone]         [facility] [employer] Follow up.   Why: ([employer]) referral sent for [employer] (order also faxed to [facility]) [employer]  to contact regarding delivery of lift to the home. Contact information: [address] [place] [address] [phone]         [facility] Follow up.   Specialty: Home Health Services Why: HHPT/OT arranged- they will contact you to schedule visits Contact information: [address] [place] [address] [phone]                 Hospital Course by problem list: Antonio Navarro is a 63 y.o. 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   Hematuria Urolithiasis Bilateral nephrolithiasis Acute on chronic blood loss anemia Renal CT revealing of bilateral nephrolithiasis and urolithiasis of left ureter. Additionally suggested hemorrhage within the left renal pelvis and ureter and clot in the bladder. Urology consulted and recommended cystoscopy with clot evacuation and bilateral JJ stent placement for gross hematuria. FOBT negative, so source of bleeding likely GU in nature. Patient nonverbal, but guarding and grimacing is visible on abdominal portion of initial physical exam.  Urology performed cystoscopy with clot evacuation and bilateral JJ stent placement. 1 cm stone of left ureter was unable to be removed.  Urology recommended continued bladder irrigation, empiric IV CTX, and holding ASA and Plavix while inpatient. Hand irrigate catheter as needed for worsening hematuria and/or obstruction. Urine culture resulted in insignificant growth with <10,000 colonies. Final blood cultures grew Proteus Mirabilis, antibiotics narrowed to IV Ampicillin  BID. Urology will plan for definitive stone treatment in 2 weeks outpatient. Urology clamped continued bladder irrigation on 8/18. Had  trial of void on 8/20, of which he completed and passed.  Urology will follow-up with patient around 9/5.  We will have the patient follow-up in our clinic on 8/28 at 10:15 AM to ensure his urology appointment has been scheduled and that he is still taking his p.o. amoxicillin  3 times daily until definitive stone treatment.    Hgb in ED 7.1; likely explained by acute blood loss. 1u pRBCs in ED. Hemoglobin improved to 8.4. Morning CBCs trended, found Hgb to be 6, additional 2u pRBCs ordered for a total of 3u transfused. Suspect ongoing GU losses but CBC also reflects dilution. Underlying poor nutrition might contribute, will check reticulocytes. Hgb on discharge 7.6.   UTI Leukocytosis Infectious encephalopathy Patient support [name] and [name] noticed that patient had initially become more fatigued and less interactive, giving rise to concern for acute encephalopathy. Cause of potential acute encephalopathy  is most likely 2/2 infection in setting of leukocytosis, worsening obstructive urolithiasis, and UA revealing of bacteria. Blood cultures preliminary result showing gram-negative rods, positive for Proteus species.  Leukocytosis slightly worsened from 12.7 to 19.7 overnight. Continued to follow CBC to ensure leukocytosis improves. 4 doses of CTX given, narrowed to IV ampicillin  2g. Fiance, when at bedside, reports patient is more at baseline after treatment. Continued to monitor neuro status compared to baseline via caretaker input as patient is non-verbal and immobile at baseline.     Hx of anoxic brain injury Tongue/Lip Biting Dysphagia Cachexia At baseline, patient is non-ambulatory and fully dependent for ADL's, however will interact with those around him with facial expressions and is able to communicate with head nodding/shaking in response to yes/no questions. He is visibly frail, with cachetic appearance, likely multifactorial 2/2 to anoxic brain injury, immobility, sarcopenia, and poor  oral intake. BMI is 18.3. Superficial wound noted on bottom lip with blood crusting around mouth. Likely self-inflicted from chronic lip biting. Per family, pt has dysphagia at baseline with reports that he will choke on things like hotdogs, but with other things he is fine. SLP and RD consulted. On dysphagia 3 diet per SLP. RD recommended full supervision with meals, addition of protein shakes, and small sips and bites of meals.    AKI Per other chart, patient baseline Scr is around 1.15 and on ED admission is now 2.16. Suspect this is multifactorial, 2/2 to low PO intake and also post-obstructive from obstructive urolithiasis.  Monitored with trending Scr and oral hydration, s/p urological procedure and NS bolus. Improved to 1.76 at discharge.   HFrecEF Per EMR, patient has history of HFrecEF with most recent echo in 2022 showing EF 55-60%. Held GDMT currently in the setting of AKI and not wanting to blunt tachycardia, as these findings are indicative of distress and pain in non-verbal patient.    Prostate cancer (clinical stage T3bN1) Patient follows with hematology/oncology physician Dr. Federico. He currently receives injections but no longer receives radiation treatments as of 01/2022.   Goals of Care Patient was transitioned to comfort based care after completing 6 (out of 30) radiation treatments in 01/2022 per most recent oncology note. A goals of care conversation was conducted at beside with IMTS, patient, [name], and [name] in regard to code status. Explained that patient has multiple chronic comorbidities that would increase risk of cardiovascular events and likelihood of tolerating compressions/resuscitation measures. Discussed the importance of goals of care conversation prior to any medical interventions. [name] and [name] are his medical decision makers and initially opted for full code status and are ok with patient receiving blood. Similar conversations have been documented in previous  encounters in EMR. Palliative consulted for quality of life/GOC conversation. PT/OT consulted. PT recommends [facility] lift, stair ramp/lift for home, and continued outpatient PT services. OT recommended B hand splints to wear at night. After further discussions with palliative medicine they are leaning towards changing to DNR but would like to discuss it further with their family before making the change in the chart.    Discharge Subjective: Patient presented to ED on 8/15 for increasing fatigue and hematuria with clots.  He was admitted for Proteus mirabilis bacteremia secondary to UTI and bilateral urolithiasis after bilateral ureteral stent placements.  He was able to successfully complete a voiding trial yesterday per urology, and will follow-up with them for definitive stone treatment outpatient.  Today, he shakes his head when asked about pain or discomfort.  He has  no elicited concerns and he appears stable.  He is ready for discharge.  Discharge Exam:   BP 112/71 (BP Location: Left Arm)   Pulse 82   Temp 98.6 F (37 C) (Oral)   Resp 18   Ht 5' 9 (1.753 m)   Wt 61.9 kg   SpO2 98%   BMI 20.15 kg/m  Constitutional: Chronically ill-appearing male laying in bed, in no acute distress HENT: normocephalic atraumatic, mucous membranes moist Eyes: conjunctiva non-erythematous Neck: supple Cardiovascular: regular rate and rhythm, no m/r/g Pulmonary/Chest: Clear to auscultation bilaterally in anterior fields, normal work of breathing on room air.   Abdominal: soft, non-tender, non-distended, positive bowel sounds Neurological: Alert, unable to answer orientation questions.  Nods yes or no to some questions but otherwise unable to converse. Skin: warm and dry Psych: Pleasant mood and affect, at baseline  Pertinent Labs, Studies, and Procedures:     Latest Ref Rng & Units 04/08/2024    5:12 AM 04/07/2024    5:13 AM 04/06/2024    9:27 AM  CBC  WBC 4.0 - 10.5 K/uL 7.3  8.2  9.0   Hemoglobin  13.0 - 17.0 g/dL 7.6  8.4  8.6   Hematocrit 39.0 - 52.0 % 23.9  25.7  26.9   Platelets 150 - 400 K/uL 149  164  174        Latest Ref Rng & Units 04/08/2024    5:12 AM 04/07/2024    5:13 AM 04/06/2024    9:27 AM  CMP  Glucose 70 - 99 mg/dL 98  883  869   BUN 8 - 23 mg/dL 15  16  18    Creatinine 0.61 - 1.24 mg/dL 8.23  8.18  8.22   Sodium 135 - 145 mmol/L 144  140  141   Potassium 3.5 - 5.1 mmol/L 3.6  3.8  3.9   Chloride 98 - 111 mmol/L 108  107  109   CO2 22 - 32 mmol/L 25  24  24    Calcium  8.9 - 10.3 mg/dL 8.4  8.2  8.4     CT Renal Stone Study Result Date: 04/02/2024 CLINICAL DATA:  Abdominal flank pain.  Stone suspected EXAM: CT ABDOMEN AND PELVIS WITHOUT CONTRAST TECHNIQUE: Multidetector CT imaging of the abdomen and pelvis was performed following the standard protocol without IV contrast. RADIATION DOSE REDUCTION: This exam was performed according to the departmental dose-optimization program which includes automated exposure control, adjustment of the mA and/or kV according to patient size and/or use of iterative reconstruction technique. COMPARISON:  PET-CT 04/03/2023 FINDINGS: Lower chest: Lung bases are clear. Hepatobiliary: No focal hepatic lesion. Normal gallbladder. No biliary duct dilatation. Common bile duct is normal. Pancreas: Pancreas is normal. No ductal dilatation. No pancreatic inflammation. Spleen: Normal spleen Adrenals/urinary tract: Adrenal glands normal. There is hydronephrosis and hydroureter of the LEFT collecting system. There is a large obstructing calculus in the mid LEFT ureter measuring 9 mm (image 46/series 3) there is high-density material within the ureter and renal pelvis proximal to the obstructing calculus which could indicate hemorrhage. Additional nonobstructing calculi in the LEFT kidney measuring 11 mm. Multiple calculi in the RIGHT kidney and RIGHT renal pelvis. No evidence obstruction. No RIGHT ureterolithiasis. No bladder calculi. There is high-density  material within the lumen of the bladder nearly filling the entire bladder. Findings concerning for clot within the bladder versus less likely bladder neoplasm. Stomach/Bowel: The stomach, duodenum, and small bowel normal. The colon and rectosigmoid colon are normal. Vascular/Lymphatic: Abdominal  aorta is normal caliber with atherosclerotic calcification. There is no retroperitoneal or periportal lymphadenopathy. No pelvic lymphadenopathy. Reproductive: Prostate unremarkable fiducial markers within the prostate gland. Other: Fluid within the LEFT inguinal canal.  No change from prior. Musculoskeletal: No aggressive osseous lesion. IMPRESSION: 1. Large obstructing calculus in the mid LEFT ureter. LEFT hydroureter. 2. High-density material in the LEFT renal pelvis and LEFT ureter proximal to the obstructing calculus. Findings concerning for hemorrhage within the LEFT renal pelvis and ureter. 3. High-density material within the lumen of the bladder concerning for clot versus less likely bladder neoplasm. 4. Bilateral nephrolithiasis. 5.  Aortic Atherosclerosis (ICD10-I70.0). Electronically Signed   By: Jackquline Boxer M.D.   On: 04/02/2024 19:25   CT Head Wo Contrast Result Date: 04/02/2024 CLINICAL DATA:  Lethargy. EXAM: CT HEAD WITHOUT CONTRAST TECHNIQUE: Contiguous axial images were obtained from the base of the skull through the vertex without intravenous contrast. RADIATION DOSE REDUCTION: This exam was performed according to the departmental dose-optimization program which includes automated exposure control, adjustment of the mA and/or kV according to patient size and/or use of iterative reconstruction technique. COMPARISON:  April 06, 2021 FINDINGS: Brain: There is generalized cerebral atrophy with widening of the extra-axial spaces and ventricular dilatation. There are areas of decreased attenuation within the white matter tracts of the supratentorial brain, consistent with microvascular disease changes.  A chronic right occipital lobe infarct is seen. A chronic right basal ganglia lacunar infarct is noted. Vascular: Marked severity bilateral cavernous carotid artery calcification is noted. Skull: Normal. Negative for fracture or focal lesion. Sinuses/Orbits: No acute finding. Other: None. IMPRESSION: 1. Generalized cerebral atrophy with chronic white matter small vessel ischemic changes. 2. Chronic right occipital lobe infarct. 3. Chronic right basal ganglia lacunar infarct. 4. No acute intracranial abnormality. Electronically Signed   By: Suzen Dials M.D.   On: 04/02/2024 16:40   DG Chest Portable 1 View Result Date: 04/02/2024 CLINICAL DATA:  EMS concern for infection.  Some cough. EXAM: PORTABLE CHEST 1 VIEW COMPARISON:  None Available. FINDINGS: Low lung volume. Bilateral lung fields are clear. Bilateral costophrenic angles are clear. Normal cardio-mediastinal silhouette. No acute osseous abnormalities. The soft tissues are within normal limits. IMPRESSION: No active disease. Electronically Signed   By: Ree Molt M.D.   On: 04/02/2024 13:51     Discharge Instructions:   Discharge Instructions      Ganesh Deeg,  You were recently admitted to [facility] for kidney stones stuck in your left ureter causing an infection and blood clots. We gave you antibiotics while you were here and the urology team put stents in your ureters to keep them open to prevent the kidney stone from causing further damage.  Continue taking your home medications with the following changes:  Start taking Amoxicillin  500 mg capsule.  Take 2 capsules by mouth 3 times a day with meals.  This was antibiotic given based off of the bacteria that was growing in your blood.  You will take this antibiotic until urology is able to come up with a concrete plan to manage your kidney stone and its removal. Ensure protein drinks.  1 bottle up to 2 times a day between meals to help gain weight and help with  hunger. Continue taking: Aspirin 81mg  daily.  If you notice any new clots or blood in your urine, please stop this medication until you are seen by a doctor. Clopidogrel 75 mg daily. If you notice any new clots or blood in your urine,  please stop this medication until you are seen by a doctor. Atorvastatin  80 mg tablet, daily. Losartan 25 mg tablet, daily. Metoprolol  25 mg tablet, 12.5 mg by mouth twice a day as previously prescribed. Zofran  4 mg tablet, taken every 8 hours as needed for nausea or vomiting. Xtandi  40 mg tablet, taken in the morning, at noon, in the evening, and at bedtime.   You should seek further medical care if you develop a fever over 101 F, have a change in your demeanor, notice new blood or blood clots in your urine, or have your blood pressure consistently remain elevated over 180/100.  We have scheduled you an appointment on August 28 at 10:15 AM with Dr. Darvin Panda.  At this appointment, we will make sure you have a follow-up urology appointment if the urology clinic does not call you.   We are so glad that you are feeling better.   Sincerely, Prestin Munch, DO     Signed: Romond Pipkins, DO Internal Medicine Resident, PGY-1 04/08/2024, 1:29 PM   Please contact the on call pager after 5 pm and on weekends at [phone].

## 2024-04-08 NOTE — Plan of Care (Signed)

## 2024-04-09 ENCOUNTER — Encounter: Payer: Self-pay | Admitting: Hematology and Oncology

## 2024-04-09 ENCOUNTER — Encounter: Payer: Self-pay | Admitting: Nurse Practitioner

## 2024-04-15 ENCOUNTER — Inpatient Hospital Stay: Admitting: Student

## 2024-04-20 ENCOUNTER — Other Ambulatory Visit: Payer: Self-pay | Admitting: Urology

## 2024-04-20 ENCOUNTER — Telehealth: Payer: Self-pay | Admitting: Internal Medicine

## 2024-04-20 NOTE — Telephone Encounter (Signed)
 Left message for patient to call our office to schedule IN OFFICE Preop appt.

## 2024-04-20 NOTE — Telephone Encounter (Signed)
   Pre-operative Risk Assessment    Patient Name: Antonio Navarro  DOB: December 11, 1960 MRN: [id number]   Date of last office visit: 09/23/23 Date of next office visit: Not yet scheduled   Request for Surgical Clearance    Procedure:  Left Ureteroscopy, Laser Lithotripsy, Stent, Right Ureteral Stent Exchange  Date of Surgery:  Clearance 05/18/24                                Surgeon:  Dr. Arlyss Cruel Surgeon's Group or Practice Name:  [facility]  Phone number:  [phone]  Fax number:  [phone]   Type of Clearance Requested:   - Medical  - Pharmacy:  Hold Clopidogrel  (Plavix )     Type of Anesthesia:  General    Additional requests/questions:  Caller stated patient will need to hold his Plavix .  Signed, Herma KATHEE Blush   04/20/2024, 2:12 PM

## 2024-04-20 NOTE — Telephone Encounter (Signed)
    Primary Cardiologist:Traci Turner, MD  Chart reviewed as part of pre-operative protocol coverage. Because of Linell Shawn Flanary's past medical history and time since last visit, he/she will require a follow-up visit in order to better assess preoperative cardiovascular risk.  Pre-op covering staff: - Please schedule in office appointment and call patient to inform them. - Please contact requesting surgeon's office via preferred method (i.e, phone, fax) to inform them of need for appointment prior to surgery.  If applicable, this message will also be routed to pharmacy pool and/or primary cardiologist for input on holding anticoagulant/antiplatelet agent as requested below so that this information is available at time of patient's appointment.   Josefa CHRISTELLA Beauvais, NP  04/20/2024, 3:41 PM

## 2024-04-21 NOTE — Telephone Encounter (Signed)
 In Office appt made with Sister for 9/15 with Jackee alberts at 1:55

## 2024-04-21 NOTE — Telephone Encounter (Signed)
 I will update all parties involved pt has appt 9/15 with Jackee Alberts, NP.

## 2024-04-22 ENCOUNTER — Telehealth: Payer: Self-pay | Admitting: *Deleted

## 2024-04-22 NOTE — Telephone Encounter (Signed)
 RTC from [facility] was given Verbal orders for Linard Kindler. Copied from CRM [id number]. Topic: Clinical - Home Health Verbal Orders >> Apr 22, 2024  4:10 PM Susanna ORN wrote: Caller/Agency: [facility]-Physical Therapist w/[facility] Callback Number: [phone] Service Requested: Home Physical Therapy Frequency: one time a week for 8 weeks Any new concerns about the patient? No. She states patient is just returning back home from hospital & they will be working on exercising & strengthening, education, and training. >> Apr 22, 2024  4:24 PM Miquel SAILOR wrote: Lonzell from [facility] [place]/[phone] -Returning [place] call back. Called and transferred to [facility]

## 2024-04-22 NOTE — Telephone Encounter (Signed)
 Verbal authorization given for PT 1 time a week for 8 wks-paperwork to follow.Antonio Oleson Cassady9/4/20254:30 PM

## 2024-04-22 NOTE — Telephone Encounter (Signed)
 RTC to [facility],PT [facility] regarding Verbal approval fro PT.  1 time a week for 8 weeks.  Will be working on Print production planner,  Programme researcher, broadcasting/film/video , Scientific laboratory technician.    Copied from CRM [id number]. Topic: Clinical - Home Health Verbal Orders >> Apr 22, 2024  4:10 PM Susanna ORN wrote: Caller/Agency: [facility]-Physical Therapist w/[facility] Callback Number: [phone] Service Requested: Home Physical Therapy Frequency: one time a week for 8 weeks Any new concerns about the patient? No. She states patient is just returning back home from hospital & they will be working on exercising & strengthening, education, and training.

## 2024-05-02 NOTE — Progress Notes (Signed)
 Cardiology Office Note    Patient Name: Antonio Navarro Date of Encounter: 05/02/2024  Primary Care Provider:  Pcp, No Primary Cardiologist:  Wilbert Bihari, MD Primary Electrophysiologist: None   Past Medical History    Past Medical History:  Diagnosis Date   Anoxic brain injury (HCC) 07/22/2020   cardiac arrest for 10 minutes prior to resuscitationed by EMS , residual unable to write, gait instability (uses wheelchair) has speech impediment but can communicate   CAD (coronary artery disease), native coronary artery 07/2020   cardiologist--- dr santo;    07-22-2020 s/p cardiac arrest in setting of STEMI with cath showing 40%pRCA and 95% mLCx ,  DES x1 to mLCx   Dysphagia    intermittant   Family history of adverse reaction to anesthesia    sister--  hard to wake   Gait instability    residual from brain injury ,  uses wheelchair   GERD (gastroesophageal reflux disease)    History of kidney stones    History of ST elevation myocardial infarction (STEMI) 07/22/2020   inferolateral ---  VDRF  shocked 6 times by EMS, intubated,  ef 40%,  post PCI Stent to CFx, 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;   pt had prolonged hospitalization   HLD (hyperlipidemia)    Hypertension    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   Ischemic cardiomyopathy 07/2020   07-22-2020  EF 40% at time of MI;   08-06-2021  ef 55-60% per echo   Malignant neoplasm prostate [facility]) 10/2021   primary urologist-- dr borden/  oncologist--- dr shadad/  radiation oncology-- dr patrcia;  dx 03/ 2023,  Gleason 5+4, PSA 10-81   Nocturia    S/P drug eluting coronary stent placement 07/22/2021   DES x 1 to midCFx   Speech problem    pt is able to communicate needs but has a little impediment   Urinary incontinence    uses depends   Uses wheelchair    uses wheelchair due to gait instability,  can stand and pivot with minminal assist    History of Present Illness  Antonio Navarro is a 63  y.o. male with a PMH of CAD s/p inferior lateral STEMI and postcardiac arrest/VF 07/2020 PCI/DES to 5% mid circumflex HTN, HLD ischemic DCM, prostate CA who presents today for preoperative clearance.   [name] was seen initially in 07/2020 he was admitted for cardiac arrest in the setting of inferior lateral STEMI. He was resuscitated in the field with CPR and intubation and underwent emergent LHC that revealed occluded left circumflex treated with PCI/DES x 1 and no other obstructive disease present. 2D echo was completed showing EF of 40-45% and experienced AKI during admission with improvement after fluid resuscitation. He also suffered anoxic brain injury with prolonged hospitalization. He was started on GDMT repeat 2D echo completed 07/2021 showed recovered EF of 55 to 60% with no RWMA and mild LVH. He was last seen in office on 10/2021 for preoperative clearance for prostate biopsy. He is currently bedridden and wheelchair-bound due to previous anoxic brain injury.  He being treated for stage IV prostate cancer and tolerating medications without any adverse reactions.  During visit patient through shared decision with fianc and sister decided against proceeding with procedure.  He is currently being treated by Dr. Federico for management of prostate CA and was receiving Eligard  as well as Lupron  shots from urology.  He was admitted on 04/02/2024 with UTI/AKI and  creatinine of 2.1 and hematuria.  He was also found to have urolithiasis and was treated with antibiotics and underwent cystoscopy with bilateral JJ stent placement.  He had ASA and Plavix  held during hospitalization due to anemia.  Mr. Antonio Navarro presents today with his wife and sister for preoperative clearance. He is scheduled for an outpatient procedure requiring general anesthesia. His last cardiac evaluation was an echocardiogram in 2022, and there is concern about his heart's ability to withstand the stress of anesthesia, especially since he has  not had a recent ischemic evaluation. He has no current pain, fever, or chills. His heart rate is elevated at 110 beats per minute, compared to 76 during his last hospitalization. He is currently taking metoprolol  succinate twice a day, and his blood pressure is stable at 111/80 mmHg. There has been an increase in heart rate on his EKG compared to prior, and changes in the leads were observed during his recent evaluation. He has a history of anemia and has been receiving blood transfusions. He has not had any cardiac clearance during his recent hospital stay, and there is no record of a cardiac consultation during that time. He has been stable at home with no new symptoms or concerns reported.  Discussed the use of AI scribe software for clinical note transcription with the patient, who gave verbal consent to proceed.  History of Present Illness   Review of Systems  Please see the history of present illness.    All other systems reviewed and are otherwise negative except as noted above.  Physical Exam    Wt Readings from Last 3 Encounters:  04/08/24 136 lb 7.4 oz (61.9 kg)  11/28/23 136 lb 8 oz (61.9 kg)  09/23/23 114 lb (51.7 kg)   CD:Uyzmz were no vitals filed for this visit.,There is no height or weight on file to calculate BMI. GEN: Cachexia nonverbal with response to painful stimuli only Neck: No JVD; No carotid bruits Pulmonary: Clear to auscultation without rales, wheezing or rhonchi  Cardiovascular: Normal rate. Regular rhythm. Normal S1. Normal S2.   Murmurs: There is no murmur.  ABDOMEN: Soft, non-tender, non-distended EXTREMITIES:  No edema; No deformity   EKG/LABS/ Recent Cardiac Studies   ECG personally reviewed by me today -sinus tachycardia with rate of 110 bpm TWI in leads II, V3, V4, V5 and V6  Risk Assessment/Calculations:     Lab Results  Component Value Date   WBC 7.3 04/08/2024   HGB 7.6 (L) 04/08/2024   HCT 23.9 (L) 04/08/2024   MCV 92.6 04/08/2024   PLT  149 (L) 04/08/2024   Lab Results  Component Value Date   CREATININE 1.76 (H) 04/08/2024   BUN 15 04/08/2024   NA 144 04/08/2024   K 3.6 04/08/2024   CL 108 04/08/2024   CO2 25 04/08/2024   Lab Results  Component Value Date   CHOL 92 (L) 06/04/2021   HDL 48 06/04/2021   LDLCALC 32 06/04/2021   TRIG 45 06/04/2021   CHOLHDL 1.9 06/04/2021    Lab Results  Component Value Date   HGBA1C 4.7 (L) 07/29/2020   Assessment & Plan    Assessment and Plan Assessment & Plan Preoperative cardiovascular risk assessment for general anesthesia Requires assessment due to elevated heart rate and EKG changes. No ischemic evaluation since 2022. Heart failure and ischemic heart disease increase procedural risk. - Order stress test to assess cardiac function and blood flow. - Order echocardiogram to update heart function since 2022. - Monitor heart  rate at home; administer whole tablet of metoprolol  if elevated. - Discuss anesthesia options with attending if stress test indicates insufficient cardiac strength.  Atherosclerotic heart disease of native coronary artery Contributes to cardiovascular risk. EKG changes may indicate low myocardial blood flow.  Heart failure Increases moderate to high risk for procedure under general anesthesia. Requires heart function assessment.  Essential hypertension Blood pressure controlled at 111/80 mmHg. Elevated heart rate may necessitate metoprolol  adjustment. - Monitor blood pressure and heart rate at home. - Adjust metoprolol  to whole tablet if heart rate remains elevated.   1.  Preoperative clearance: - Patient is RCRI score 6.6% - He is currently planned for moderate risk procedure and cardiac risk discussed with DOD and decision to proceed with 2D echo to evaluate cardiac function. - We will plan to clear if 2D echo shows stable LV function with low risk procedure  2.Essential hypertension: -Patient's blood pressure today was 111/80 - Continue  losartan  25 mg, Toprol  12.5 mg twice daily  3. Coronary artery disease: -s/p inferior lateral STEMI and postcardiac arrest/VF 07/2020 PCI/DES to 5% mid circumflex - EKG with new TWI in inferior and precordial leads - We will complete 2D echo to evaluate heart function  - Continue current GDMT with ASA 81 mg, Lipitor  80 mg, Plavix  75 mg, Toprol  12.5 mg twice daily  4.  Prostate CA: - Currently being treated by urology with planned cystoscopy procedure pending cardiac clearance  5.  History of ICM: - History of inferior lateral STEMI with postcardiac arrest/VF - Will update 2D echo to evaluate LV function  Disposition: Follow-up with Wilbert Bihari, MD or APP in 4 months Informed Consent   Shared Decision Making/Informed Consent{      Signed, Wyn Raddle, Jackee Shove, NP 05/02/2024, 10:29 AM [facility]

## 2024-05-03 ENCOUNTER — Ambulatory Visit: Attending: Nurse Practitioner | Admitting: Nurse Practitioner

## 2024-05-03 ENCOUNTER — Encounter: Payer: Self-pay | Admitting: Nurse Practitioner

## 2024-05-03 VITALS — BP 111/80 | HR 110 | Ht 71.0 in | Wt 130.0 lb

## 2024-05-03 DIAGNOSIS — I2510 Atherosclerotic heart disease of native coronary artery without angina pectoris: Secondary | ICD-10-CM | POA: Insufficient documentation

## 2024-05-03 DIAGNOSIS — C61 Malignant neoplasm of prostate: Secondary | ICD-10-CM | POA: Diagnosis present

## 2024-05-03 DIAGNOSIS — Z01810 Encounter for preprocedural cardiovascular examination: Secondary | ICD-10-CM | POA: Insufficient documentation

## 2024-05-03 DIAGNOSIS — I10 Essential (primary) hypertension: Secondary | ICD-10-CM | POA: Insufficient documentation

## 2024-05-03 DIAGNOSIS — I255 Ischemic cardiomyopathy: Secondary | ICD-10-CM | POA: Diagnosis present

## 2024-05-03 DIAGNOSIS — I2583 Coronary atherosclerosis due to lipid rich plaque: Secondary | ICD-10-CM | POA: Diagnosis present

## 2024-05-03 MED ORDER — METOPROLOL SUCCINATE ER 25 MG PO TB24: 12.5000 mg | ORAL_TABLET | Freq: Two times a day (BID) | ORAL | 1 refills | Status: DC

## 2024-05-03 NOTE — Patient Instructions (Addendum)
 Medication Instructions:  METOPROLOL  SUCCINATE: YOU CAN TAKE AN ADDITIONAL HALF TABLET IF YOUR HEART RATE IS OVER 100. *If you need a refill on your cardiac medications before your next appointment, please call your pharmacy*  Lab Work: NONE ORDERED If you have labs (blood work) drawn today and your tests are completely normal, you will receive your results only by: MyChart Message (if you have MyChart) OR A paper copy in the mail If you have any lab test that is abnormal or we need to change your treatment, we will call you to review the results.  Testing/Procedures: Your physician has requested that you have an echocardiogram. Echocardiography is a painless test that uses sound waves to create images of your heart. It provides your doctor with information about the size and shape of your heart and how well your heart's chambers and valves are working. This procedure takes approximately one hour. There are no restrictions for this procedure. Please do NOT wear cologne, perfume, aftershave, or lotions (deodorant is allowed). Please arrive 15 minutes prior to your appointment time.  Please note: We ask at that you not bring children with you during ultrasound (echo/ vascular) testing. Due to room size and safety concerns, children are not allowed in the ultrasound rooms during exams. Our front office staff cannot provide observation of children in our lobby area while testing is being conducted. An adult accompanying a patient to their appointment will only be allowed in the ultrasound room at the discretion of the ultrasound technician under special circumstances. We apologize for any inconvenience.  Follow-Up: At [facility], you and your health needs are our priority.  As part of our continuing mission to provide you with exceptional heart care, our providers are all part of one team.  This team includes your primary Cardiologist (physician) and Advanced Practice Providers or APPs  (Physician Assistants and Nurse Practitioners) who all work together to provide you with the care you need, when you need it.  Your next appointment:   4 month(s)  Provider:   Wilbert Bihari, MD    We recommend signing up for the patient portal called MyChart.  Sign up information is provided on this After Visit Summary.  MyChart is used to connect with patients for Virtual Visits (Telemedicine).  Patients are able to view lab/test results, encounter notes, upcoming appointments, etc.  Non-urgent messages can be sent to your provider as well.   To learn more about what you can do with MyChart, go to [email or web address].   Other Instructions

## 2024-05-04 ENCOUNTER — Ambulatory Visit: Payer: Self-pay | Admitting: Nurse Practitioner

## 2024-05-04 ENCOUNTER — Ambulatory Visit (HOSPITAL_COMMUNITY)
Admission: RE | Admit: 2024-05-04 | Discharge: 2024-05-04 | Disposition: A | Discharge status: Home / Self Care | Source: Ambulatory Visit | Attending: Cardiovascular Disease | Admitting: Cardiovascular Disease

## 2024-05-04 DIAGNOSIS — I10 Essential (primary) hypertension: Secondary | ICD-10-CM | POA: Insufficient documentation

## 2024-05-04 DIAGNOSIS — Z01810 Encounter for preprocedural cardiovascular examination: Secondary | ICD-10-CM | POA: Insufficient documentation

## 2024-05-04 DIAGNOSIS — C61 Malignant neoplasm of prostate: Secondary | ICD-10-CM | POA: Diagnosis present

## 2024-05-04 DIAGNOSIS — I3139 Other pericardial effusion (noninflammatory): Secondary | ICD-10-CM

## 2024-05-04 DIAGNOSIS — I2510 Atherosclerotic heart disease of native coronary artery without angina pectoris: Secondary | ICD-10-CM | POA: Insufficient documentation

## 2024-05-04 DIAGNOSIS — I2583 Coronary atherosclerosis due to lipid rich plaque: Secondary | ICD-10-CM | POA: Insufficient documentation

## 2024-05-04 HISTORY — DX: Other pericardial effusion (noninflammatory): I31.39

## 2024-05-04 LAB — ECHOCARDIOGRAM COMPLETE
Area-P 1/2: 5.52 cm2
S' Lateral: 1.9 cm

## 2024-05-05 ENCOUNTER — Telehealth: Payer: Self-pay | Admitting: Nurse Practitioner

## 2024-05-05 NOTE — Telephone Encounter (Signed)
 Call was placed this afternoon to discuss echo results with [name] however she was not available at this time. I left a detailed message regarding her brothers test results and the need to seek care in the emergency room due to moderate pericardial effusion and abnormal EKG.  I advised her that I will try to return her call tomorrow due to me being out of the office today.   Antonio Alberts, NP

## 2024-05-05 NOTE — Telephone Encounter (Signed)
 Pt's sister [name] is requesting a callback regarding echo results and message advising they visit the ED.

## 2024-05-06 ENCOUNTER — Telehealth: Payer: Self-pay | Admitting: Nurse Practitioner

## 2024-05-06 NOTE — Telephone Encounter (Signed)
 I was able to connect with Mr. Myrie sister Ms. [name] Clause this morning to discuss his recommendation to go to the ED for evaluation.  We discussed his echo results as well as his EKG changes seen during his most recent office visit.  I advised her that with his elevated heart rate and EKG changes it is necessary to be seen in the ED for evaluation.  She had all questions answered to her satisfaction and thanked me for the call today.  Jackee Alberts, NP

## 2024-05-07 ENCOUNTER — Emergency Department (HOSPITAL_COMMUNITY)

## 2024-05-07 ENCOUNTER — Encounter (HOSPITAL_COMMUNITY): Payer: Self-pay

## 2024-05-07 ENCOUNTER — Other Ambulatory Visit (HOSPITAL_COMMUNITY): Payer: Self-pay

## 2024-05-07 ENCOUNTER — Emergency Department (HOSPITAL_COMMUNITY): Admission: EM | Admit: 2024-05-07 | Discharge: 2024-05-07 | Disposition: A | Discharge status: Home / Self Care

## 2024-05-07 DIAGNOSIS — R059 Cough, unspecified: Secondary | ICD-10-CM | POA: Insufficient documentation

## 2024-05-07 DIAGNOSIS — I2510 Atherosclerotic heart disease of native coronary artery without angina pectoris: Secondary | ICD-10-CM | POA: Diagnosis not present

## 2024-05-07 DIAGNOSIS — Z7901 Long term (current) use of anticoagulants: Secondary | ICD-10-CM | POA: Insufficient documentation

## 2024-05-07 DIAGNOSIS — D649 Anemia, unspecified: Secondary | ICD-10-CM | POA: Insufficient documentation

## 2024-05-07 DIAGNOSIS — Z7982 Long term (current) use of aspirin: Secondary | ICD-10-CM | POA: Insufficient documentation

## 2024-05-07 DIAGNOSIS — I10 Essential (primary) hypertension: Secondary | ICD-10-CM | POA: Diagnosis not present

## 2024-05-07 DIAGNOSIS — I3139 Other pericardial effusion (noninflammatory): Secondary | ICD-10-CM

## 2024-05-07 DIAGNOSIS — Z79899 Other long term (current) drug therapy: Secondary | ICD-10-CM | POA: Diagnosis not present

## 2024-05-07 DIAGNOSIS — Z8674 Personal history of sudden cardiac arrest: Secondary | ICD-10-CM

## 2024-05-07 DIAGNOSIS — I493 Ventricular premature depolarization: Secondary | ICD-10-CM

## 2024-05-07 DIAGNOSIS — R0602 Shortness of breath: Secondary | ICD-10-CM | POA: Diagnosis present

## 2024-05-07 DIAGNOSIS — Z01810 Encounter for preprocedural cardiovascular examination: Secondary | ICD-10-CM | POA: Diagnosis not present

## 2024-05-07 DIAGNOSIS — I255 Ischemic cardiomyopathy: Secondary | ICD-10-CM

## 2024-05-07 LAB — BASIC METABOLIC PANEL WITH GFR
Anion gap: 9 (ref 5–15)
BUN: 11 mg/dL (ref 8–23)
CO2: 22 mmol/L (ref 22–32)
Calcium: 8.6 mg/dL — ABNORMAL LOW (ref 8.9–10.3)
Chloride: 108 mmol/L (ref 98–111)
Creatinine, Ser: 1.61 mg/dL — ABNORMAL HIGH (ref 0.61–1.24)
GFR, Estimated: 48 mL/min — ABNORMAL LOW (ref >60)
Glucose, Bld: 105 mg/dL — ABNORMAL HIGH (ref 70–99)
Potassium: 4 mmol/L (ref 3.5–5.1)
Sodium: 139 mmol/L (ref 135–145)

## 2024-05-07 LAB — URINALYSIS, ROUTINE W REFLEX MICROSCOPIC
Bilirubin Urine: NEGATIVE
Glucose, UA: NEGATIVE mg/dL
Ketones, ur: NEGATIVE mg/dL
Nitrite: NEGATIVE
Protein, ur: 30 mg/dL — AB
Specific Gravity, Urine: 1.009 (ref 1.005–1.030)
WBC, UA: >50 WBC/hpf (ref 0–5)
pH: 6 (ref 5.0–8.0)

## 2024-05-07 LAB — I-STAT CHEM 8, ED
BUN: 14 mg/dL (ref 8–23)
Calcium, Ion: 1.22 mmol/L (ref 1.15–1.40)
Chloride: 107 mmol/L (ref 98–111)
Creatinine, Ser: 1.8 mg/dL — ABNORMAL HIGH (ref 0.61–1.24)
Glucose, Bld: 110 mg/dL — ABNORMAL HIGH (ref 70–99)
HCT: 22 % — ABNORMAL LOW (ref 39.0–52.0)
Hemoglobin: 7.5 g/dL — ABNORMAL LOW (ref 13.0–17.0)
Potassium: 3.9 mmol/L (ref 3.5–5.1)
Sodium: 143 mmol/L (ref 135–145)
TCO2: 26 mmol/L (ref 22–32)

## 2024-05-07 LAB — CBC
HCT: 24.3 % — ABNORMAL LOW (ref 39.0–52.0)
Hemoglobin: 7.2 g/dL — ABNORMAL LOW (ref 13.0–17.0)
MCH: 28.9 pg (ref 26.0–34.0)
MCHC: 29.6 g/dL — ABNORMAL LOW (ref 30.0–36.0)
MCV: 97.6 fL (ref 80.0–100.0)
Platelets: 192 K/uL (ref 150–400)
RBC: 2.49 MIL/uL — ABNORMAL LOW (ref 4.22–5.81)
RDW: 16.1 % — ABNORMAL HIGH (ref 11.5–15.5)
WBC: 4.9 K/uL (ref 4.0–10.5)
nRBC: 0 % (ref 0.0–0.2)

## 2024-05-07 LAB — RESP PANEL BY RT-PCR (RSV, FLU A&B, COVID)  RVPGX2
Influenza A by PCR: NEGATIVE
Influenza B by PCR: NEGATIVE
Resp Syncytial Virus by PCR: NEGATIVE
SARS Coronavirus 2 by RT PCR: NEGATIVE

## 2024-05-07 LAB — ECHOCARDIOGRAM LIMITED: S' Lateral: 3.4 cm

## 2024-05-07 LAB — TROPONIN I (HIGH SENSITIVITY)
Troponin I (High Sensitivity): 4 ng/L (ref <18)
Troponin I (High Sensitivity): 5 ng/L (ref <18)

## 2024-05-07 LAB — I-STAT CG4 LACTIC ACID, ED: Lactic Acid, Venous: 1.8 mmol/L (ref 0.5–1.9)

## 2024-05-07 LAB — ABO/RH: ABO/RH(D): O POS

## 2024-05-07 MED ORDER — SODIUM CHLORIDE 0.9% IV SOLUTION
Freq: Once | INTRAVENOUS | Status: DC
Start: 2024-05-07 — End: 2024-05-07

## 2024-05-07 MED ORDER — ASPIRIN 81 MG PO CHEW
81.0000 mg | CHEWABLE_TABLET | Freq: Every day | ORAL | 0 refills | Status: DC
  Filled 2024-05-07: qty 30, 30d supply, fill #0

## 2024-05-07 MED ORDER — ASPIRIN 81 MG PO CHEW
81.0000 mg | CHEWABLE_TABLET | Freq: Once | ORAL | Status: DC
  Filled 2024-05-07: qty 1

## 2024-05-07 NOTE — ED Triage Notes (Signed)
 Pt BIB [facility] from home with c/o SHOB. Pt family reported that pt was breathing funny and making strange sound for the past 2 days. Pt has hx of brain injury, GCS 11 baseline.   147/87 HR 73 97% room air  RR 18 CBG 144

## 2024-05-07 NOTE — ED Notes (Signed)
 CCMD called to place pt on monitor

## 2024-05-07 NOTE — Discharge Instructions (Addendum)
 Antonio Navarro  Thank you for allowing us  to take care of you today.  You came to the Emergency Department today because at your outpatient cardiology visit they found fluid around your heart.  There are many things that can make fluid collect around your heart, however the most important factors are how much fluid there is and how fast it is accumulating, as these are the 2 things that determine whether or not it is putting pressure on your heart.  If fluid puts too much pressure on your heart that can cause your heart to not pump well and can cause low blood pressure.  Here in the emergency department we talked with the cardiologist, and they did a repeat echocardiogram which showed that the collection of fluid is not rapidly accumulating relative to a few days ago, and we are not seeing signs of it putting too much pressure in your heart, therefore you are safe to continue your follow-up outpatient.  You will need to get your nuclear medicine stress test done outpatient.  Cardiology would like you to take 81 mg of aspirin  daily.   To-Do: 1. Please follow-up with your primary doctor within 1 week/ as soon as possible.   Please return to the Emergency Department or call 911 if you experience have worsening of your symptoms, or do not get better, chest pain, shortness of breath, severe or significantly worsening pain, high fever, severe confusion, pass out or have any reason to think that you need emergency medical care.   We hope you feel better soon.   [place] [facility]

## 2024-05-07 NOTE — Progress Notes (Signed)
 Echocardiogram 2D Echocardiogram has been performed.  Antonio Navarro 05/07/2024, 5:04 PM

## 2024-05-07 NOTE — ED Provider Notes (Signed)
 [place] EMERGENCY DEPARTMENT AT [facility] Provider Note   CSN: [id number] Arrival date & time: 05/07/24  1037     Patient presents with: Shortness of Breath   Antonio Navarro is a 63 y.o. male.   HPI     Patient was brought in because concern for increased work of breathing.  Family member noted that yesterday felt like he was working a little harder to breathe than his baseline.  Was gasping at times.  No obvious fevers at home.  Not been taking the temperature.  Noticed that he was slightly diaphoretic yesterday.  Endorsing a cough as well.  Has been tolerating p.o.  No nausea vomit diarrhea.  No obvious dysuria or hematuria that fianc has noticed.  Previous medical history reviewed : Patient initially follow-up with cardiology on May 03, 2024. PMH of CAD s/p inferior lateral STEMI and postcardiac arrest/VF 07/2020 PCI/DES to 5% mid circumflex HTN, HLD ischemic DCM, prostate CA was told to come to the ED because of abnormal echo which showed moderate pericardial effusion abnormal EKG.  EF of 55 to 60%.  Left ventricular.  Moderately sized pericardial effusion.  No evidence of cardiac tamponade. Blood cultures positive for Proteus species.  Initially started on ceftriaxone  and narrowed to ampicillin . Patient was discharged on April 08, 2024.  Infectious encephalopathy at that time.  Worsening obstructive urolithiasis.  UA revealing bacteremia.  Positive Proteus species on blood cultures.  Urology performed cystoscopy with clot evacuation and bilateral JJ stent placement.  1 cm stone of left ureter was unable to be removed.  Plan was to follow-up with urology in 2 weeks following discharge for definitive management of stone.  Prior to Admission medications   Medication Sig Start Date End Date Taking? Authorizing Provider  acetaminophen  (TYLENOL ) 500 MG tablet Take 500 mg by mouth every 6 (six) hours as needed for mild pain (pain score 1-3) or moderate pain (pain  score 4-6).   Yes [provider]  aspirin  81 MG chewable tablet Chew 1 tablet (81 mg total) by mouth daily. 11/24/20  Yes Alto Isaiah CROME, NP  atorvastatin  (LIPITOR ) 80 MG tablet Take 1 tablet (80 mg total) by mouth daily. 09/23/23  Yes Wyn Jackee VEAR Mickey., NP  clopidogrel  (PLAVIX ) 75 MG tablet Take 1 tablet (75 mg total) by mouth daily. 09/23/23  Yes Wyn Jackee VEAR Mickey., NP  enzalutamide  (XTANDI ) 40 MG tablet Take 4 tablets (160 mg total) by mouth daily. 02/16/24  Yes Federico Norleen ONEIDA MADISON, MD  feeding supplement (ENSURE PLUS HIGH PROTEIN) LIQD Take 237 mLs by mouth 2 (two) times daily between meals. 04/08/24  Yes Amilibia, Jaden, DO  losartan  (COZAAR ) 25 MG tablet Take 1 tablet (25 mg total) by mouth daily. 09/23/23  Yes Wyn Jackee VEAR Mickey., NP  metoprolol  succinate (TOPROL -XL) 25 MG 24 hr tablet Take 0.5 tablets (12.5 mg total) by mouth 2 (two) times daily. Can take an additional half tablet if heart rate is over 100 05/03/24  Yes Dick, Jackee VEAR Mickey., NP  ondansetron  (ZOFRAN -ODT) 4 MG disintegrating tablet Take 4 mg by mouth every 8 (eight) hours as needed for nausea or vomiting.   Yes [provider]  polyethylene glycol (MIRALAX  / GLYCOLAX ) 17 g packet Take 17 g by mouth daily as needed for moderate constipation. 11/24/20  Yes Alto Isaiah CROME, NP    Allergies: Patient has no known allergies.    Review of Systems  Updated Vital Signs BP 127/79   Pulse 79  Temp 97.9 F (36.6 C) (Oral)   Resp 15   SpO2 100%   Physical Exam  (all labs ordered are listed, but only abnormal results are displayed) Labs Reviewed  BASIC METABOLIC PANEL WITH GFR - Abnormal; Notable for the following components:      Result Value   Glucose, Bld 105 (*)    Creatinine, Ser 1.61 (*)    Calcium  8.6 (*)    GFR, Estimated 48 (*)    All other components within normal limits  CBC - Abnormal; Notable for the following components:   RBC 2.49 (*)    Hemoglobin 7.2 (*)    HCT 24.3 (*)    MCHC 29.6 (*)    RDW  16.1 (*)    All other components within normal limits  I-STAT CHEM 8, ED - Abnormal; Notable for the following components:   Creatinine, Ser 1.80 (*)    Glucose, Bld 110 (*)    Hemoglobin 7.5 (*)    HCT 22.0 (*)    All other components within normal limits  RESP PANEL BY RT-PCR (RSV, FLU A&B, COVID)  RVPGX2  URINALYSIS, ROUTINE W REFLEX MICROSCOPIC  I-STAT CG4 LACTIC ACID, ED  I-STAT CG4 LACTIC ACID, ED  ABO/RH  TYPE AND SCREEN  TROPONIN I (HIGH SENSITIVITY)  TROPONIN I (HIGH SENSITIVITY)    EKG: EKG Interpretation Date/Time:  Friday May 07 2024 10:46:53 EDT Ventricular Rate:  67 PR Interval:  132 QRS Duration:  97 QT Interval:  443 QTC Calculation: 468 R Axis:   8  Text Interpretation: Sinus rhythm Ventricular premature complex Abnormal R-wave progression, early transition Abnormal T, consider ischemia, diffuse leads Confirmed by Simon Rea [phone]) on 05/07/2024 10:54:04 AM  Radiology: [facility] Chest Port 1 View Result Date: 05/07/2024 EXAM: 1 VIEW XRAY OF THE CHEST 05/07/2024 11:19:00 AM COMPARISON: 04/02/24. CLINICAL HISTORY: SOB. SOB FINDINGS: LINES, TUBES AND DEVICES: Overlapping cardiac leads. LUNGS AND PLEURA: Underinflation. No focal pulmonary opacity. No pulmonary edema. No pleural effusion. No pneumothorax. HEART AND MEDIASTINUM: Calcified aorta. No acute abnormality of the cardiac and mediastinal silhouettes. BONES AND SOFT TISSUES: No acute osseous abnormality. IMPRESSION: 1. No acute findings. Electronically signed by: Donnice Mania MD 05/07/2024 11:46 AM EDT RP Workstation: [id number]     Ultrasound ED Echo  Date/Time: 05/07/2024 2:56 PM  Performed by: Simon Rea SAILOR, MD Authorized by: Simon Rea SAILOR, MD   Procedure details:    Views: parasternal long axis view and apical 4 chamber view     Images: archived     Limitations:  Increased thoracic air Findings:    Pericardium: small pericardial effusion     LV Function: normal (>50% EF)     RV Diameter: normal    Impression:    Impression: pericardial effusion present      Medications Ordered in the ED  0.9 %  sodium chloride  infusion (Manually program via Guardrails IV Fluids) (has no administration in time range)    Clinical Course as of 05/07/24 1542  Fri May 07, 2024  1531 Pending cards recs, hx of arrest and anoxic brain injury, has pericardial effusion, sent in from cards office, EKG borderline, Hb 7.2, family declined [LS]    Clinical Course User Index [LS] Rogelia Jerilynn RAMAN, MD                                 Medical Decision Making Amount and/or Complexity of Data Reviewed Labs: ordered. Radiology:  ordered.  Risk Prescription drug management.   Patient was brought in because concern for increased work of breathing.  Family member noted that yesterday felt like he was working a little harder to breathe than his baseline.  Was gasping at times.  No obvious fevers at home.  Not been taking the temperature.  Noticed that he was slightly diaphoretic yesterday.  Endorsing a cough as well.  Has been tolerating p.o.  No nausea vomit diarrhea.  No obvious dysuria or hematuria that fianc has noticed.  Previous medical history reviewed : Patient initially follow-up with cardiology on May 03, 2024. PMH of CAD s/p inferior lateral STEMI and postcardiac arrest/VF 07/2020 PCI/DES to 5% mid circumflex HTN, HLD ischemic DCM, prostate CA was told to come to the ED because of abnormal echo which showed moderate pericardial effusion abnormal EKG.  EF of 55 to 60%.  Left ventricular.  Moderately sized pericardial effusion.  No evidence of cardiac tamponade. Blood cultures positive for Proteus species.  Initially started on ceftriaxone  and narrowed to ampicillin . Patient was discharged on April 08, 2024.  Infectious encephalopathy at that time.  Worsening obstructive urolithiasis.  UA revealing bacteremia.  Positive Proteus species on blood cultures.  Urology performed cystoscopy with clot  evacuation and bilateral JJ stent placement.  1 cm stone of left ureter was unable to be removed.  Plan was to follow-up with urology in 2 weeks following discharge for definitive management of stone.   Upon exam, patient maps appropriate.  Afebrile.  O2 saturation 99% on room air.  Patient at mental baseline per family member.  Patient responding to commands.  Able squeeze my fingers bilaterally.  Opens eyes upon command as well.   Soft benign abdomen.    In terms of patient's anemia.  Patient has downtrending anemia.  Previously, thought was related to stone and subsequent bleeding from this.  Has had brown stools.  No bright red blood per rectum or dark stools or concern for acute GI bleed.  Has slow downtrending hemoglobin.  I feel like this is likely because of patient's known stone burden.  Recommended transfusion at least 1 unit of packed red blood cells given patient's cardiac history and the fact that his hemoglobin today is 7.2.  Damien at this elected to not receive transfusion.  Patient also states he does not want transfusion.  His sister is at bedside and states that they like to not pursue transfusion at this point time.  Explained my rationale behind this and explained he could drop hgb further which could lead to further complications.  They stated that they understood and are willing to accept the risk.  I explained that they need to have repeat CBC at least within the next week to make sure no ongoing drop in hemoglobin and he needs to come back to ED if he notices any kind of dark stools or bright red blood per rectum or increasing fatigue or weakness.   In terms of patient's pericardial effusion.  Performed bedside echo here in the ED.  Small to moderate pericardial effusion similar to what was seen on the echo performed outpatient.  Patient's maps appropriate.  No hypoxia.  No concerns for tamponade effect.    In terms of patient's possibly new T wave inversions.  Troponin  unremarkable.  Patient remained on room air.  Consulted cardiology given the effusion as well as the EKG changes for further evaluation.   No concerns any kind of sepsis picture at this point time.  Lactic acid unremarkable.  No leukocytosis or left shift.  No pneumonia seen on x-ray. Signed out pending cardiology consult.       Final diagnoses:  Pericardial effusion  Anemia, unspecified type    ED Discharge Orders     None          Simon Lavonia SAILOR, MD 05/07/24 1542

## 2024-05-07 NOTE — ED Provider Notes (Signed)
  [place] EMERGENCY DEPARTMENT AT [facility] Provider Assume Care Note I assumed care of Antonio Navarro on 05/07/2024 at 3:30 PM from Dr. Simon.   Briefly, Antonio Navarro is a 63 y.o. male who: PMHx: Prior cardiac arrest, anoxic brain injury, HTN, ischemic cardiomyopathy, CAD, dysphagia, HLD P/w pericardial effusion found in cardiology clinic Labs remarkable for hemoglobin of 7.2, blood transfusion offered by prior provider and declined by family Cardiology consulted for recommendations regarding pericardial effusion   Plan at the time of handoff: Follow-up cardiology recommendations   Please refer to the original provider's note for additional information regarding the care of Antonio Navarro.  Reassessment: I personally reassessed the patient: Patient without complaints, family complains of prolonged ED course, and that they would like to be discharged  Vital Signs:  ED Triage Vitals  Encounter Vitals Group     BP 05/07/24 1047 99/78     Girls Systolic BP Percentile --      Girls Diastolic BP Percentile --      Boys Systolic BP Percentile --      Boys Diastolic BP Percentile --      Pulse Rate 05/07/24 1047 68     Resp 05/07/24 1047 11     Temp 05/07/24 1048 97.9 F (36.6 C)     Temp Source 05/07/24 1048 Oral     SpO2 05/07/24 1044 99 %     Weight --      Height --      Head Circumference --      Peak Flow --      Pain Score 05/07/24 1044 0     Pain Loc --      Pain Education --      Exclude from Growth Chart --      Hemodynamics:  The patient is hemodynamically stable. Mental Status:  The patient is alert  Additional MDM: On my initial evaluation, family frustrated by duration of time spent in emergency department, states that they would like to be discharged, discussed that we are pending cardiology evaluations and recommendation for pericardial effusion.  I have read discussed case with cardiology, who states that they require a repeat formal echo in  the ED to evaluate for speed of accumulation of pericardial fluid.  Ultimately, family amenable to staying for this to be obtained, and cardiology felt reassured by overall stability of fluid collection.  They feel that patient is stable for discharge on daily low-dose aspirin  and follow-up in cardiology clinic, family is amenable to this plan.  Discharged in stable condition.  Disposition: DISCHARGE: I believe that the patient is safe for discharge home with outpatient follow-up. Patient was informed of all pertinent physical exam, laboratory, and imaging findings. Patient's suspected etiology of their symptom presentation was discussed with the patient and all questions were answered. We discussed following up with cardiology clinic. I provided thorough ED return precautions. The patient feels safe and comfortable with this plan.   FREDRIK CANDIE Later, MD Emergency Medicine    Later Jerilynn RAMAN, MD 05/10/24 [phone]

## 2024-05-07 NOTE — Consult Note (Addendum)
 Cardiology Consultation   Patient ID: BRYN PERKIN MRN: [id number]; DOB: August 06, 1961  Admit date: 05/07/2024 Date of Consult: 05/07/2024  PCP:  Freddrick, No    [facility] Providers Cardiologist:  Wilbert Bihari, MD      Patient Profile: DAQUANE AGUILAR is a 63 y.o. 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 Simon.  History of Present Illness: Mr. Fitzmaurice suffered a cardiac arrest with 9 min downtime found ot have STEMI. He had an emergent heart cath that showed 95% mid LCX and 40$ pRCA stenosis. He was treated with DES-LCX. Unfortunately he suffered an anoxic brain injury to to cardiac arrest and downtime along with prolonged hospitalizations. Echo showed LVEF 40-45% felt to be an ischemic cardiomyopathy. GDMT iniitiated and follow up echo 07/2021 showed recovered LVEF 55-60%, no RWMA, and mild LVH. He was seen in 2023 for cardiac risk evaluation prior to prostate biopsy and now treated for stage IV prostate cancer. He is now wheelchair bound.   He was recently admitted August 2025 with infectious encephalopathy and worsening obstructive urolithiasis.  He was bacteremic on antibiotic.  Urology performed cystoscopy with clot evacuation and bilateral JJ stent placement.  He was seen 05/04/24 for cardiac risk evaluation prior to left ureteroscopy, laser lithotripsy and stent exchange with urology. Echocardiogram and nuclear stress test were planned for risk stratification and also in the setting of sinus tachycardia on EKG.  Echocardiogram was completed and showed preserved LVEF 55-60%, could not evaluate RWMA, mild concentric LVH, grade 1 DD, normal RV size and function, and moderate pericardial effusion without evidence of cardiac tamponade.  Given sinus tachycardia and pericardial  effusion, he was directed to the ER for further evaluation.  During my exam, he was brought in for increased work of breathing.   His sister is bedside and helps with history along with the patient's fiance who is on the phone. They report he had a cold/URI last week, but deny SOB, CP, or increased work of breathing. Pt does not answer questions, but opens eyes to questions. Does not appear uncomfortable or in distress.   Past Medical History:  Diagnosis Date   Anoxic brain injury (HCC) 07/22/2020   cardiac arrest for 10 minutes prior to resuscitationed by EMS , residual unable to write, gait instability (uses wheelchair) has speech impediment but can communicate   CAD (coronary artery disease), native coronary artery 07/2020   cardiologist--- dr santo;    07-22-2020 s/p cardiac arrest in setting of STEMI with cath showing 40%pRCA and 95% mLCx ,  DES x1 to mLCx   Dysphagia    intermittant   Family history of adverse reaction to anesthesia    sister--  hard to wake   Gait instability    residual from brain injury ,  uses wheelchair   GERD (gastroesophageal reflux disease)    History of kidney stones    History of ST elevation myocardial infarction (STEMI) 07/22/2020   inferolateral ---  VDRF  shocked 6 times by EMS, intubated,  ef 40%,  post PCI Stent to CFx, 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;   pt had prolonged hospitalization   HLD (hyperlipidemia)    Hypertension    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   Ischemic cardiomyopathy 07/2020  07-22-2020  EF 40% at time of MI;   08-06-2021  ef 55-60% per echo   Malignant neoplasm [facility]) 10/2021   primary urologist-- dr borden/  oncologist--- dr shadad/  radiation oncology-- dr patrcia;  dx 03/ 2023,  Gleason 5+4, PSA 10-81   Nocturia    S/P drug eluting coronary stent placement 07/22/2021   DES x 1 to midCFx   Speech problem    pt is able to communicate needs but has a little impediment    Urinary incontinence    uses depends   Uses wheelchair    uses wheelchair due to gait instability,  can stand and pivot with minminal assist    Past Surgical History:  Procedure Laterality Date   CORONARY/GRAFT ACUTE MI REVASCULARIZATION N/A 07/22/2020   Procedure: Coronary/Graft Acute MI Revascularization;  Surgeon: Court Dorn PARAS, MD;  Location: [facility] INVASIVE CV LAB;  Service: Cardiovascular;  Laterality: N/A;   CYSTOSCOPY W/ URETERAL STENT PLACEMENT Bilateral 04/02/2024   Procedure: CYSTOSCOPY, WITH RETROGRADE PYELOGRAM AND BILATERAL  URETERAL STENT INSERTION;  Surgeon: Devere Lonni Righter, MD;  Location: [facility] OR;  Service: Urology;  Laterality: Bilateral;  CLOT EVACUATION AND STENT PLACEMENT, BILATERAL   GOLD SEED IMPLANT N/A 02/26/2022   Procedure: GOLD SEED IMPLANT;  Surgeon: Devere Lonni Righter, MD;  Location: [facility];  Service: Urology;  Laterality: N/A;  30 MINS FOR THIS CASE   IR GASTROSTOMY TUBE MOD SED  08/17/2020   IR GASTROSTOMY TUBE REMOVAL  11/08/2020   LEFT HEART CATH AND CORONARY ANGIOGRAPHY N/A 07/22/2020   Procedure: LEFT HEART CATH AND CORONARY ANGIOGRAPHY;  Surgeon: Court Dorn PARAS, MD;  Location: [facility] INVASIVE CV LAB;  Service: Cardiovascular;  Laterality: N/A;   PROSTATE BIOPSY N/A 11/13/2021   Procedure: BIOPSY TRANSRECTAL ULTRASONIC PROSTATE (TUBP);  Surgeon: Lovie Arlyss CROME, MD;  Location: [facility] ORS;  Service: Urology;  Laterality: N/A;  45 MINS FOR THIS CASE   RADIOLOGY WITH ANESTHESIA N/A 10/16/2021   Procedure: MRI WITH ANESTHESIA OF PROSTATE WITH AND WITHOUT CONTRAST;  Surgeon: Radiologist, Medication, MD;  Location: [facility] OR;  Service: Radiology;  Laterality: N/A;   SPACE OAR INSTILLATION N/A 02/26/2022   Procedure: SPACE OAR INSTILLATION;  Surgeon: Devere Lonni Righter, MD;  Location: [facility];  Service: Urology;  Laterality: N/A;     Home Medications:  Prior to Admission medications   Medication Sig Start Date End  Date Taking? Authorizing Provider  acetaminophen  (TYLENOL ) 500 MG tablet Take 500 mg by mouth every 6 (six) hours as needed for mild pain (pain score 1-3) or moderate pain (pain score 4-6).   Yes [provider]  aspirin  81 MG chewable tablet Chew 1 tablet (81 mg total) by mouth daily. 11/24/20  Yes Alto Isaiah CROME, NP  atorvastatin  (LIPITOR ) 80 MG tablet Take 1 tablet (80 mg total) by mouth daily. 09/23/23  Yes Wyn Jackee VEAR Mickey., NP  clopidogrel  (PLAVIX ) 75 MG tablet Take 1 tablet (75 mg total) by mouth daily. 09/23/23  Yes Wyn Jackee VEAR Mickey., NP  enzalutamide  (XTANDI ) 40 MG tablet Take 4 tablets (160 mg total) by mouth daily. 02/16/24  Yes Federico Norleen ONEIDA MADISON, MD  feeding supplement (ENSURE PLUS HIGH PROTEIN) LIQD Take 237 mLs by mouth 2 (two) times daily between meals. 04/08/24  Yes Amilibia, Jaden, DO  losartan  (COZAAR ) 25 MG tablet Take 1 tablet (25 mg total) by mouth daily. 09/23/23  Yes Wyn Jackee VEAR Mickey., NP  metoprolol  succinate (TOPROL -XL) 25 MG 24  hr tablet Take 0.5 tablets (12.5 mg total) by mouth 2 (two) times daily. Can take an additional half tablet if heart rate is over 100 05/03/24  Yes Dick, Jackee VEAR Raddle., NP  ondansetron  (ZOFRAN -ODT) 4 MG disintegrating tablet Take 4 mg by mouth every 8 (eight) hours as needed for nausea or vomiting.   Yes [provider]  polyethylene glycol (MIRALAX  / GLYCOLAX ) 17 g packet Take 17 g by mouth daily as needed for moderate constipation. 11/24/20  Yes Alto Isaiah CROME, NP    Scheduled Meds:  sodium chloride    Intravenous Once   Continuous Infusions:  PRN Meds:   Allergies:   No Known Allergies  Social History:   Social History   Socioeconomic History   Marital status: Single    Spouse name: Not on file   Number of children: Not on file   Years of education: Not on file   Highest education level: Not on file  Occupational History   Not on file  Tobacco Use   Smoking status: Former    Current packs/day: 0.00    Average  packs/day: 1 pack/day for 40.0 years (40.0 ttl pk-yrs)    Types: Cigarettes    Start date: 07/21/1980    Quit date: 07/21/2020    Years since quitting: 3.7   Smokeless tobacco: Never  Vaping Use   Vaping status: Never Used  Substance and Sexual Activity   Alcohol  use: Not Currently    Comment: none since 12/ 2021   Drug use: Not Currently    Types: Marijuana    Comment: none since 12/ 2021   Sexual activity: Not on file  Other Topics Concern   Not on file  Social History Narrative   ** Merged History Encounter **       Social Drivers of Health   Financial Resource Strain: Not on file  Food Insecurity: No Food Insecurity (03/17/2023)   Hunger Vital Sign    Worried About Running Out of Food in the Last Year: Never true    Ran Out of Food in the Last Year: Never true  Transportation Needs: No Transportation Needs (03/17/2023)   PRAPARE - Administrator, Civil Service (Medical): No    Lack of Transportation (Non-Medical): No  Physical Activity: Not on file  Stress: Not on file  Social Connections: Not on file  Intimate Partner Violence: Patient Unable To Answer (03/17/2023)   Humiliation, Afraid, Rape, and Kick questionnaire    Fear of Current or Ex-Partner: Patient unable to answer    Emotionally Abused: Patient unable to answer    Physically Abused: Patient unable to answer    Sexually Abused: Patient unable to answer    Family History:    Family History  Problem Relation Age of Onset   Cancer Mother        d. age 58; unknown type   Hypertension Father    Congestive Heart Failure Father    Breast cancer Sister 30   Diabetes type II Sister    Hypertension Sister    Hypertension Brother    Autism spectrum disorder Brother    Ovarian cancer Maternal Aunt        dx before age 69   Prostate cancer Maternal Uncle        dx after 6   Breast cancer Paternal Aunt        dx 62s   Cancer Paternal Aunt        dx after 68; unknown  type   Ovarian cancer Paternal  Grandmother        dx and d. mid 71s     ROS:  Please see the history of present illness.   All other ROS reviewed and negative.     Physical Exam/Data: Vitals:   05/07/24 1300 05/07/24 1315 05/07/24 1402 05/07/24 1415  BP: 108/80 118/79  (!) 151/90  Pulse: 72 91  75  Resp: 14 (!) 22  14  Temp:      TempSrc:   Oral   SpO2: 100% 100%  100%   No intake or output data in the 24 hours ending 05/07/24 1503    05/03/2024    2:39 PM 04/08/2024    3:00 AM 04/07/2024    7:50 AM  Last 3 Weights  Weight (lbs) 130 lb 136 lb 7.4 oz 138 lb 3.7 oz  Weight (kg) 58.968 kg 61.9 kg 62.7 kg     There is no height or weight on file to calculate BMI.  General:  nonverbal, NAD, muscle spasticity  HEENT: normal Neck: + JVD Cardiac:  normal S1, S2; RRR; do not hear rub Lungs:  anterior lung exam clear, respirations unlabored Abd: soft, nontender, no hepatomegaly  Ext: no edema Musculoskeletal: contracted extremities Skin: warm and dry  Neuro:  extremity muscled contracted, nonverbal Psych:  Normal affect   EKG:  The EKG was personally reviewed and demonstrates:  SR HR 67, PVC, inferior TWI Telemetry:  Telemetry was personally reviewed and demonstrates:  SR with HR 70-80s  Relevant CV Studies:  Echo 05/04/24:  1. Left ventricular ejection fraction, by estimation, is 55 to 60%. The  left ventricle has normal function. Left ventricular endocardial border  not optimally defined to evaluate regional wall motion. There is mild  concentric left ventricular  hypertrophy. Left ventricular diastolic parameters are consistent with  Grade I diastolic dysfunction (impaired relaxation).   2. Peak RV-RA gradient 17 mmHg. Right ventricular systolic function is  normal. The right ventricular size is normal.   3. The mitral valve is normal in structure. No evidence of mitral valve  regurgitation. No evidence of mitral stenosis.   4. The aortic valve is tricuspid. Aortic valve regurgitation is not   visualized. No aortic stenosis is present.   5. IVC not visualized.   6. Moderate pericardial effusion. The pericardial effusion is surrounding  the apex. There is no evidence of cardiac tamponade.   7. Technically difficult study with very poor images. I think that LV and  RV function are grossly normal.   Laboratory Data: High Sensitivity Troponin:   Recent Labs  Lab 05/07/24 1055 05/07/24 1255  TROPONINIHS 4 5     Chemistry Recent Labs  Lab 05/07/24 1055 05/07/24 1133  NA 139 143  K 4.0 3.9  CL 108 107  CO2 22  --   GLUCOSE 105* 110*  BUN 11 14  CREATININE 1.61* 1.80*  CALCIUM  8.6*  --   GFRNONAA 48*  --   ANIONGAP 9  --     No results for input(s): PROT, ALBUMIN, AST, ALT, ALKPHOS, BILITOT in the last 168 hours. Lipids No results for input(s): CHOL, TRIG, HDL, LABVLDL, LDLCALC, CHOLHDL in the last 168 hours.  Hematology Recent Labs  Lab 05/07/24 1055 05/07/24 1133  WBC 4.9  --   RBC 2.49*  --   HGB 7.2* 7.5*  HCT 24.3* 22.0*  MCV 97.6  --   MCH 28.9  --   MCHC 29.6*  --  RDW 16.1*  --   PLT 192  --    Thyroid No results for input(s): TSH, FREET4 in the last 168 hours.  BNPNo results for input(s): BNP, PROBNP in the last 168 hours.  DDimer No results for input(s): DDIMER in the last 168 hours.  Radiology/Studies:  DG Chest Port 1 View Result Date: 05/07/2024 EXAM: 1 VIEW XRAY OF THE CHEST 05/07/2024 11:19:00 AM COMPARISON: 04/02/24. CLINICAL HISTORY: SOB. SOB FINDINGS: LINES, TUBES AND DEVICES: Overlapping cardiac leads. LUNGS AND PLEURA: Underinflation. No focal pulmonary opacity. No pulmonary edema. No pleural effusion. No pneumothorax. HEART AND MEDIASTINUM: Calcified aorta. No acute abnormality of the cardiac and mediastinal silhouettes. BONES AND SOFT TISSUES: No acute osseous abnormality. IMPRESSION: 1. No acute findings. Electronically signed by: Donnice Mania MD 05/07/2024 11:46 AM EDT RP Workstation: [id number]    ECHOCARDIOGRAM COMPLETE Result Date: 05/04/2024    ECHOCARDIOGRAM REPORT   Patient Name:   LIMUEL NIEBLAS Date of Exam: 05/04/2024 Medical Rec #:  [id number]      Height:       71.0 in Accession #:    [id number]     Weight:       130.0 lb Date of Birth:  06-Oct-1960      BSA:          1.756 m Patient Age:    63 years       BP:           111/80 mmHg Patient Gender: M              HR:           115 bpm. Exam Location:  [address] Procedure: 2D Echo, 3D Echo, Cardiac Doppler and Color Doppler (Both Spectral            and Color Flow Doppler were utilized during procedure). Indications:    Z01.810 Pre-Operative Eval                 I25.10 CAD  History:        Patient has prior history of Echocardiogram examinations, most                 recent 08/06/2021. CAD. Anoxic Brain Injury (patient is                 non-verbal, and wheelchair bound). Ischemic Cardiomyopathy,                 Pre-Operative Eval for Kidney stone (and stent) removal.  Sonographer:    Heather Hawks RDCS Referring Phys: WYN MANUS, H  Sonographer Comments: Patient is sitting upright in wheelchair for exam. He is non-Verbal, and very agitated during procedure. He does not cooperate with positioning, breathing, or holding still. He has a moderate to large effusion, unclear of symptoms, or if this is new. IMPRESSIONS  1. Left ventricular ejection fraction, by estimation, is 55 to 60%. The left ventricle has normal function. Left ventricular endocardial border not optimally defined to evaluate regional wall motion. There is mild concentric left ventricular hypertrophy. Left ventricular diastolic parameters are consistent with Grade I diastolic dysfunction (impaired relaxation).  2. Peak RV-RA gradient 17 mmHg. Right ventricular systolic function is normal. The right ventricular size is normal.  3. The mitral valve is normal in structure. No evidence of mitral valve regurgitation. No evidence of mitral stenosis.  4. The aortic valve is tricuspid.  Aortic valve regurgitation is not visualized. No aortic stenosis is present.  5. IVC not visualized.  6. Moderate pericardial effusion. The pericardial effusion is surrounding the apex. There is no evidence of cardiac tamponade.  7. Technically difficult study with very poor images. I think that LV and RV function are grossly normal. FINDINGS  Left Ventricle: Left ventricular ejection fraction, by estimation, is 55 to 60%. The left ventricle has normal function. Left ventricular endocardial border not optimally defined to evaluate regional wall motion. The left ventricular internal cavity size was normal in size. There is mild concentric left ventricular hypertrophy. Left ventricular diastolic parameters are consistent with Grade I diastolic dysfunction (impaired relaxation). Right Ventricle: Peak RV-RA gradient 17 mmHg. The right ventricular size is normal. Right vetricular wall thickness was not well visualized. Right ventricular systolic function is normal. Left Atrium: Left atrial size was normal in size. Right Atrium: Right atrial size was normal in size. Pericardium: A moderately sized pericardial effusion is present. The pericardial effusion is surrounding the apex. There is no evidence of cardiac tamponade. Mitral Valve: The mitral valve is normal in structure. No evidence of mitral valve regurgitation. No evidence of mitral valve stenosis. Tricuspid Valve: The tricuspid valve is normal in structure. Tricuspid valve regurgitation is trivial. Aortic Valve: The aortic valve is tricuspid. Aortic valve regurgitation is not visualized. No aortic stenosis is present. Pulmonic Valve: The pulmonic valve was not well visualized. Pulmonic valve regurgitation is not visualized. Aorta: The aortic root is normal in size and structure. Venous: IVC not visualized. IAS/Shunts: No atrial level shunt detected by color flow Doppler.  LEFT VENTRICLE PLAX 2D LVIDd:         3.10 cm   Diastology LVIDs:         1.90 cm   LV e'  medial:    6.84 cm/s LV PW:         0.90 cm   LV E/e' medial:  5.6 LV IVS:        1.10 cm   LV e' lateral:   15.90 cm/s LVOT diam:     2.20 cm   LV E/e' lateral: 2.4 LVOT Area:     3.80 cm LV IVRT:       40 msec  RIGHT VENTRICLE RV S prime:     15.50 cm/s  PULMONARY VEINS TAPSE (M-mode): 1.3 cm      A Reversal Velocity: 38.80 cm/s RVSP:           20.5 mmHg   Diastolic Velocity:  26.70 cm/s                             S/D Velocity:        1.90                             Systolic Velocity:   50.30 cm/s LEFT ATRIUM           Index        RIGHT ATRIUM LA diam:      1.90 cm 1.08 cm/m   RA Pressure: 3.00 mmHg LA Vol (A4C): 30.8 ml 17.54 ml/m   AORTA Ao Root diam: 3.00 cm Ao Asc diam:  3.10 cm MITRAL VALVE               TRICUSPID VALVE MV Area (PHT)  cm         TR Peak grad:   17.5 mmHg MV Decel Time: 138 msec    TR Vmax:  209.00 cm/s MV E velocity: 38.20 cm/s  Estimated RAP:  3.00 mmHg MV A velocity: 66.60 cm/s  RVSP:           20.5 mmHg MV E/A ratio:  0.57                            SHUNTS                            Systemic Diam: 2.20 cm Dalton McleanMD Electronically signed by Ezra Kanner Signature Date/Time: 05/04/2024/4:43:03 PM    Final      Assessment and Plan:  Pericardial effusion - Echocardiogram 05/04/2024 with moderate pericardial effusion - history of prostate cancer -EKG was sinus rhythm, PVCs, T wave inversions have improved and are now evident in inferior leads -Will obtain stat limited echo for pericardial effusion and tamponade -- HR is in the 60-70s, normotensive, breathing unlabored -- will obtain stat limited echo for pericardial effusion - depending on results will review with interventional team -- based on exam, do not suspect his effusion has worsened, but symptoms are difficult to ascertain given neurological status -- would likely not be a candidate for a window -- remains on plavix , could consider ASA monotherapy with pericardial effusion   CAD  STEMI with  DES-LCx, in the setting of cardiac arrest - Remains on DAPT with aspirin  and Plavix , Plavix  will need to be held for urological procedure -- nonverbal, unclear chest pain complaints   Ischemic cardiomyopathy - LVEF 40-45% at the time of STEMI, improved with GDMT: Losartan , Toprol  -- does not appear volume up -- LVEF normalized on follow up echocardiograms   PVCs - noted on EKG and telemetry - check Mg - continue BB    Preoperative risk stratification for urological procedure If pericardial effusion stable, would proceed with procedure. Pt is at high risk for MACE; however, risk factors are not modifiable at this point. If pericardial effusion has worsened, will need to discuss next steps regarding pericardiocentesis and timing of stent exchange.   For questions or updates, please contact [facility] Please consult [email or web address] for contact info under      Signed, Jon Nat Hails, PA  05/07/2024 3:03 PM  I have personally evaluated and examined the patient. The history, physical exam, and medical decision making documented below were performed independently and substantively by me. I have reviewed all relevant data, formulated the assessment and plan, and assumed responsibility for the management of this patient. My documentation reflects the substantive portion of the split/shared visit, in accordance with CMS and CPT guidelines. I have updated the NPP documentation above as appropriate.  I have personally performed the substantive portion of the medical decision making, including interpretation of diagnostic data, formulation of the management plan, and assessment of risks. In summation:  Mr. Colin is a 63 year old with coronary artery disease and ischemic cardiomyopathy who presents with increased work of breathing and pericardial effusion (no increase WOB or tachycardia on my exam).  He has a history of anoxic brain injury following a cardiac arrest in the setting of  STEMI with coronary intervention. His coronary artery disease and ischemic cardiomyopathy have improved with goal-directed medical therapy and percutaneous intervention.  He is aphasic as bedside  In August 2025, he was admitted for infectious encephalopathy and bacteremia, which improved. During this admission, urology performed cystoscopy with clot evacuation and bilateral stent placement.  Due to his anoxic brain injury, he is unable to provide a detailed history, and his imaging is technically difficult. His imaging is technically difficult. He was imaged upright in his chair, which affected the quality of the echocardiogram.  Exam notable for  Gen: ill appearing but chronically so   Neck: No JVD Cardiac: No Rubs or Gallops, no Murmur, RRR +2radial pulses Respiratory: Clear to auscultation bilaterally, normal effort, normal  respiratory rate GI: Soft, nontender, non-distended  MS: No  edema;  moves all extremities Integument: Skin feels warm  Personally reviewed data and interpretation:   LABS Troponin: 4 and 5 (05/07/2024)  DIAGNOSTIC EKG: Sinus rhythm with inferior T wave inversions and isolated PVCs (05/07/2024) Echocardiogram: Moderate apical pericardial effusion, no RV or right atrial collapse, suboptimal quality (05/04/2024) performed at bedside Tele: SR with PVCs  In assessment and plan:   Pericardial effusion On 9/16 echo; Moderate apical pericardial effusion on echocardiogram with no right ventricular or right atrial collapse. Suboptimal initial imaging due to chair positioning, but repeat imaging showed smaller fluid collection and no tamponade. No hemodynamic compromise. - Review repeat echocardiogram images  done with family at bedside with me aiding in acqusition; with this no respirophasic changes, small effusion, no tamponade, no IVC plethora - Communicateed findings to the emergency room team to determine if discharge is appropriate.  Coronary artery  disease Coronary artery disease with previous STEMI and [facility] intervention. Current evaluation includes pericardial effusion and heart rate management. - Stress test is reasonable given his limited ability; will defer to primary cardiologist on timing of this with upcoming procedure, agree to decrease to ASA monotherapy as he has not had recent intervention and needs antiplatelet care.  Reviewed with family at length.  Stanly Leavens, MD FASE [facility] Cardiologist [facility]  [address] [place], [address] [place], [place] [address] [phone]  6:45 PM

## 2024-05-10 ENCOUNTER — Other Ambulatory Visit (HOSPITAL_COMMUNITY): Payer: Self-pay

## 2024-05-11 ENCOUNTER — Telehealth: Payer: Self-pay | Admitting: Nurse Practitioner

## 2024-05-11 NOTE — Telephone Encounter (Signed)
 Left message for the pt to call back tomorrow; per Dr. Shlomo:   05/11/24  4:41 PM I have not seen this patient since 2022.  He is going to have to be brought in for a DOD visit sometime this week so he can have his surgery next Tuesday   05/11/24  1:43 PM Goodrich, Callie E, PA-C routed this conversation to Shlomo Wilbert SAUNDERS, MD  Goodrich, Callie E, PA-C    05/11/24  4:48 PM Note Preop covering staff, please see Dr. Dorine note below. Can you please help arrange an in office visit (Dr. Shlomo recommended with DOD) before procedure next week.    Thank you!    Shlomo Wilbert SAUNDERS, MD to Goodrich, Callie E, PA-C  [facility] Triage      05/11/24  4:41 PM I have not seen this patient since 2022.  He is going to have to be brought in for a DOD visit sometime this week so he can have his surgery next Tuesday   05/11/24  1:43 PM Goodrich, Callie E, PA-C routed this conversation to Shlomo Wilbert SAUNDERS, MD  Goodrich, Callie E, PA-C    05/11/24  1:43 PM Note Hey Dr. Shlomo! Please see discussion with Navarro below. Is patient okay to proceed with surgery? Please route response back to P CV DIV PREOP. Thank you!   Cass Alberts, Navarro VEAR Raddle., NP to Goodrich, Callie E, PA-C     05/11/24  1:02 PM Hey Callie,   Mr. Fails is unable to hold still for a stress test due to his anoxic brain injury.  I discussed his case with DOD on the day of his visit and the 2D echo was ordered as a surgical evaluation.  I do not feel comfortable offering a preop assessment assessment for clearance and would defer to Dr. Shlomo.      ThanksJackee   05/11/24 10:34 AM Goodrich, Callie E, PA-C routed this conversation to Alberts Navarro VEAR Raddle., NP  Goodrich, Callie E, PA-C    05/11/24 10:34 AM Note Antonio Navarro! You recently saw this patient on 05/03/2024 for pre-op evaluation. Echo was ordered and showed moderate pericardial effusion and he was sent to the ED. He was evaluated by our team there and then discharged.   Repeat limited Echo was ordered there and per Dr. Milan note showed a small effusion.  There was also mention of a stress test in your note but it does not look like this is ordered. Does he still need this? If so, can you order this? If not, are you able to provide pre-op risk assessment?   Thank you! Callie       05/11/24 10:11 AM You routed this conversation to Cv Div Preop  Me    05/11/24 10:11 AM Note I will forward to preop APP to review if the pt has been cleared.        05/11/24  9:22 AM Neysa Mort routed this conversation to Cv Div Preop Callback  Neysa Mort [facility]   05/11/24  9:22 AM Note [facility] calling to f/u on surgical clearance since pt has had echocardiogram and preop appt. Please advise.       [facility] to Neysa Mort

## 2024-05-11 NOTE — Telephone Encounter (Signed)
 Preop covering staff, please see Dr. Dorine note below. Can you please help arrange an in office visit (Dr. Shlomo recommended with DOD) before procedure next week.   Thank you!

## 2024-05-11 NOTE — Telephone Encounter (Signed)
 Hey Dr. Shlomo! Please see discussion with Jackee below. Is patient okay to proceed with surgery? Please route response back to P CV DIV PREOP. Thank you!  ~Bryan Omura

## 2024-05-11 NOTE — Telephone Encounter (Signed)
 [facility] calling to f/u on surgical clearance since pt has had echocardiogram and preop appt. Please advise.

## 2024-05-11 NOTE — Telephone Encounter (Signed)
 Hey Jackee! You recently saw this patient on 05/03/2024 for pre-op evaluation. Echo was ordered and showed moderate pericardial effusion and he was sent to the ED. He was evaluated by our team there and then discharged.  Repeat limited Echo was ordered there and per Dr. Milan note showed a small effusion.  There was also mention of a stress test in your note but it does not look like this is ordered. Does he still need this? If so, can you order this? If not, are you able to provide pre-op risk assessment?  Thank you! Jayna Mulnix

## 2024-05-11 NOTE — Telephone Encounter (Signed)
 I will forward to preop APP to review if the pt has been cleared.

## 2024-05-12 ENCOUNTER — Encounter: Payer: Self-pay | Admitting: Hematology and Oncology

## 2024-05-12 ENCOUNTER — Ambulatory Visit: Admitting: Cardiovascular Disease

## 2024-05-12 NOTE — Telephone Encounter (Signed)
 I called to s/w the pt and his sister Suzen answered and said this was her #, she helps with appts etc for her brother who is bedridden and non verbal. I explained that per Dr. Shlomo she wants the pt to be seen this week with DOD as she has not seen the pt herself since 2022 for further review appt with MD from echo results and for preop clearance.   Pt's sister said she needs to s/w pt's fiance and see if they can bring the pt in today @ 1:15 with Dr. Court DOD or tomorrow 05/13/24 with Dr. Lavona @ 3:20. I asked for her to call back ASAP to let us  know which appt is being kept and we will then cancel the other appt.   Procedure is 05/18/24.   I will update all parties at this time.

## 2024-05-12 NOTE — Progress Notes (Unsigned)
 Cardiology Office Note:   Date:  05/13/2024  ID:  Antonio Navarro, DOB 03/15/1961, MRN [id number] PCP: [name], DO  [facility] Providers Cardiologist:  Wilbert Bihari, MD {  History of Present Illness:   Antonio Navarro is a 63 y.o. male with a PMH of CAD s/p inferior lateral STEMI and postcardiac arrest/VF 07/2020 PCI/DES to 5% mid circumflex HTN, HLD ischemic DCM, prostate CA who presents today for preoperative clearance.    Mr. Antonio Navarro was seen initially in 07/2020 he was admitted for cardiac arrest in the setting of inferior lateral STEMI. He was resuscitated in the field with CPR and intubation and underwent emergent LHC that revealed occluded left circumflex treated with PCI/DES x 1 and no other obstructive disease present. 2D echo was completed showing EF of 40-45% and experienced AKI during admission with improvement after fluid resuscitation. He also suffered anoxic brain injury with prolonged hospitalization. He was started on GDMT repeat 2D echo completed 07/2021 showed recovered EF of 55 to 60% with no RWMA and mild LVH. He was seen in office on 10/2021 for preoperative clearance for prostate biopsy. He is currently bedridden and wheelchair-bound due to previous anoxic brain injury.    He is currently being treated by Dr. Federico for management of prostate CA and was receiving Eligard  as well as Lupron  shots from urology.  He was admitted on 04/02/2024 with UTI/AKI and creatinine of 2.1 and hematuria.  He was also found to have urolithiasis and was treated with antibiotics and underwent cystoscopy with bilateral JJ stent placement.  He had ASA and Plavix  held during hospitalization due to anemia.  He was seen most recently for preop clearance.    Since he was last seen he was in the emergency room on 9/19.  He had been seen in 9/16 Prior to left uteroscopy and laser lithotripsy.  Echocardiography and nuclear stress testing have been planned.  Echo was completed and could not  evaluate regional wall motion abnormalities.  There was mild concentric LVH and a moderate pericardial effusion without tamponade.  He was sent to the emergency room.  He was having no symptoms.  He was seen in consultation by us .  He was normotensive and heart rate was okay.  A repeat echocardiogram done at bedside demonstrated small effusion and no evidence of tamponade.  He was discharged from the emergency room.  He still has not had stress testing.  He is able to answer yes/no questions by shaking his head and I think he understands me but he is nonverbal.  His girlfriend is with him and she lives with him and takes care of him.  He is bedbound and in a wheelchair but he is able to eat on his own.  They do not note him to be having any shortness of breath and no chest discomfort.  He denies chest discomfort, neck or arm discomfort or shortness of breath.  He is had no palpitations, presyncope or syncope.   ROS: As stated in the HPI and negative for all other systems.  Studies Reviewed:    EKG:   EKG Interpretation Date/Time:  Thursday May 13 2024 15:57:52 EDT Ventricular Rate:  88 PR Interval:  134 QRS Duration:  86 QT Interval:  388 QTC Calculation: 469 R Axis:   16  Text Interpretation: Normal sinus rhythm T wave abnormality, consider inferolateral ischemia Prolonged QT When compared with ECG of 07-May-2024 10:46, No significant change since last tracing Confirmed by Lavona Agent ([id number]) on  05/13/2024 3:59:08 PM   Risk Assessment/Calculations:     Physical Exam:   VS:  BP (!) 130/91   Pulse 88   Ht 5' 11 (1.803 m)   Wt 135 lb (61.2 kg) Comment: Verbal  SpO2 100%   BMI 18.83 kg/m    Wt Readings from Last 3 Encounters:  05/13/24 135 lb (61.2 kg)  05/03/24 130 lb (59 kg)  04/08/24 136 lb 7.4 oz (61.9 kg)     GEN: Well nourished, well developed in no acute distress NECK: No JVD; No carotid bruits CARDIAC: RRR, no murmurs, rubs, gallops RESPIRATORY:  Clear to  auscultation without rales, wheezing or rhonchi  ABDOMEN: Soft, non-tender, non-distended EXTREMITIES:  No edema; severe muscle wasting  ASSESSMENT AND PLAN:      Essential hypertension: His blood pressure is at target.  No change in therapy.  Coronary artery disease: He has no ongoing symptoms.  He will continue with risk reduction.   Prostate CA: Currently being treated by urology and has upcoming procedures as described.  See preop below.   History of ICM: His EF is well-preserved to 60 to 65% on the follow-up echo.  No change in therapy.  Pericardial fusion: I am going to follow-up with a repeat echo in 2 months.  He has no tamponade physiology.  No change in therapy.  Preop: It would be extremely difficult to do any preoperative screening with him.  He had urologic procedure done with sedation a few months ago and he did well with this.  He is not having any unstable symptoms.  I would not suggest further cardiovascular testing be attempted since he would not be able to comply with perfusion imaging and he has no unstable symptoms and this is not a high risk procedure.  I had this conversation with the patient's girlfriend.  Follow up in 3 months after his echocardiogram  Signed, Lynwood Schilling, MD

## 2024-05-12 NOTE — Telephone Encounter (Signed)
 Me    05/12/24 11:36 AM Note I called to s/w the pt and his sister Suzen answered and said this was her #, she helps with appts etc for her brother who is bedridden and non verbal. I explained that per Dr. Shlomo she wants the pt to be seen this week with DOD as she has not seen the pt herself since 2022 for further review appt with MD from echo results and for preop clearance.    Pt's sister said she needs to s/w pt's fiance and see if they can bring the pt in today @ 1:15 with Dr. Court DOD or tomorrow 05/13/24 with Dr. Lavona @ 3:20. I asked for her to call back ASAP to let us  know which appt is being kept and we will then cancel the other appt.    Procedure is 05/18/24.    I will update all parties at this time.       05/12/24 11:31 AM You contacted Linard Renny PARAS    05/12/24 11:30 AM You contacted Chaos, Carlile  Me    05/11/24  5:03 PM Note Left message for the pt to call back tomorrow; per Dr. Shlomo:   05/11/24  4:41 PM I have not seen this patient since 2022.  He is going to have to be brought in for a DOD visit sometime this week so he can have his surgery next Tuesday   05/11/24  1:43 PM Goodrich, Callie E, PA-C routed this conversation to Shlomo Wilbert SAUNDERS, MD   Goodrich, Callie E, PA-C    05/11/24  4:48 PM Note Preop covering staff, please see Dr. Dorine note below. Can you please help arrange an in office visit (Dr. Shlomo recommended with DOD) before procedure next week.    Thank you!    Shlomo Wilbert SAUNDERS, MD to Goodrich, Callie E, PA-C  [facility] Triage      05/11/24  4:41 PM I have not seen this patient since 2022.  He is going to have to be brought in for a DOD visit sometime this week so he can have his surgery next Tuesday   05/11/24  1:43 PM Goodrich, Callie E, PA-C routed this conversation to Shlomo Wilbert SAUNDERS, MD   Goodrich, Callie E, PA-C    05/11/24  1:43 PM Note Hey Dr. Shlomo! Please see discussion with Jackee below. Is patient okay to proceed  with surgery? Please route response back to P CV DIV PREOP. Thank you!   Cass Alberts, Jackee VEAR Raddle., NP to Goodrich, Callie E, PA-C     05/11/24  1:02 PM Hey Callie,   Mr. Chavira is unable to hold still for a stress test due to his anoxic brain injury.  I discussed his case with DOD on the day of his visit and the 2D echo was ordered as a surgical evaluation.  I do not feel comfortable offering a preop assessment assessment for clearance and would defer to Dr. Shlomo.      ThanksJackee   05/11/24 10:34 AM Goodrich, Callie E, PA-C routed this conversation to Alberts Jackee VEAR Raddle., NP   Goodrich, Callie E, PA-C    05/11/24 10:34 AM Note Walterine Jackee! You recently saw this patient on 05/03/2024 for pre-op evaluation. Echo was ordered and showed moderate pericardial effusion and he was sent to the ED. He was evaluated by our team there and then discharged.  Repeat limited Echo was ordered there and per Dr.  Chandrasekhar's note showed a small effusion.  There was also mention of a stress test in your note but it does not look like this is ordered. Does he still need this? If so, can you order this? If not, are you able to provide pre-op risk assessment?   Thank you! Callie        05/11/24 10:11 AM You routed this conversation to Cv Div Preop   Me    05/11/24 10:11 AM Note I will forward to preop APP to review if the pt has been cleared.         05/11/24  9:22 AM Neysa Mort routed this conversation to Cv Div Preop Callback   Neysa Mort [facility]   05/11/24  9:22 AM Note [facility] calling to f/u on surgical clearance since pt has had echocardiogram and preop appt. Please advise.         [facility] to Neysa Mort       05/11/24  5:00 PM You attempted to contact Cj, Edgell (Left Message)    05/11/24  4:48 PM Goodrich, Callie E, PA-C routed this conversation to Cv Div Preop Callback (Selected Message)  Goodrich, Callie E, PA-C    05/11/24  4:48  PM Note Preop covering staff, please see Dr. Dorine note below. Can you please help arrange an in office visit (Dr. Shlomo recommended with DOD) before procedure next week.    Thank you!     Shlomo Wilbert SAUNDERS, MD to Goodrich, Callie E, PA-C  [facility] Triage      05/11/24  4:41 PM I have not seen this patient since 2022.  He is going to have to be brought in for a DOD visit sometime this week so he can have his surgery next Tuesday   05/11/24  1:43 PM Goodrich, Callie E, PA-C routed this conversation to Shlomo Wilbert SAUNDERS, MD  Goodrich, Callie E, PA-C    05/11/24  1:43 PM Note Hey Dr. Shlomo! Please see discussion with Jackee below. Is patient okay to proceed with surgery? Please route response back to P CV DIV PREOP. Thank you!   Cass Alberts, Jackee VEAR Raddle., NP to Goodrich, Callie E, PA-C     05/11/24  1:02 PM Hey Callie,   Mr. Steib is unable to hold still for a stress test due to his anoxic brain injury.  I discussed his case with DOD on the day of his visit and the 2D echo was ordered as a surgical evaluation.  I do not feel comfortable offering a preop assessment assessment for clearance and would defer to Dr. Shlomo.      ThanksJackee   05/11/24 10:34 AM Goodrich, Callie E, PA-C routed this conversation to Alberts Jackee VEAR Raddle., NP  Goodrich, Callie E, PA-C    05/11/24 10:34 AM Note Walterine Jackee! You recently saw this patient on 05/03/2024 for pre-op evaluation. Echo was ordered and showed moderate pericardial effusion and he was sent to the ED. He was evaluated by our team there and then discharged.  Repeat limited Echo was ordered there and per Dr. Milan note showed a small effusion.  There was also mention of a stress test in your note but it does not look like this is ordered. Does he still need this? If so, can you order this? If not, are you able to provide pre-op risk assessment?   Thank you! Callie       05/11/24 10:11 AM You routed  this  conversation to Cv Div Preop  Me    05/11/24 10:11 AM Note I will forward to preop APP to review if the pt has been cleared.        05/11/24  9:22 AM Neysa Mort routed this conversation to Cv Div Preop Callback  Neysa Mort [facility]   05/11/24  9:22 AM Note [facility] calling to f/u on surgical clearance since pt has had echocardiogram and preop appt. Please advise.       [facility] to Neysa Mort [facility]    05/11/24  9:20 AM ext. [phone]

## 2024-05-13 ENCOUNTER — Ambulatory Visit: Attending: Cardiology | Admitting: Cardiology

## 2024-05-13 ENCOUNTER — Telehealth: Payer: Self-pay | Admitting: *Deleted

## 2024-05-13 ENCOUNTER — Encounter (HOSPITAL_COMMUNITY): Payer: Self-pay | Admitting: Urology

## 2024-05-13 ENCOUNTER — Encounter: Payer: Self-pay | Admitting: Cardiology

## 2024-05-13 VITALS — BP 130/91 | HR 88 | Ht 71.0 in | Wt 135.0 lb

## 2024-05-13 DIAGNOSIS — I2583 Coronary atherosclerosis due to lipid rich plaque: Secondary | ICD-10-CM | POA: Diagnosis not present

## 2024-05-13 DIAGNOSIS — I255 Ischemic cardiomyopathy: Secondary | ICD-10-CM | POA: Diagnosis not present

## 2024-05-13 DIAGNOSIS — I2510 Atherosclerotic heart disease of native coronary artery without angina pectoris: Secondary | ICD-10-CM | POA: Diagnosis not present

## 2024-05-13 DIAGNOSIS — I10 Essential (primary) hypertension: Secondary | ICD-10-CM | POA: Insufficient documentation

## 2024-05-13 NOTE — Patient Instructions (Signed)
 Medication Instructions:  Continue same medications *If you need a refill on your cardiac medications before your next appointment, please call your pharmacy*  Lab Work: None ordered  Testing/Procedures: Schedule Limited Echo in 2 months  Follow-Up: At [facility], you and your health needs are our priority.  As part of our continuing mission to provide you with exceptional heart care, our providers are all part of one team.  This team includes your primary Cardiologist (physician) and Advanced Practice Providers or APPs (Physician Assistants and Nurse Practitioners) who all work together to provide you with the care you need, when you need it.  Your next appointment:  3 months    Provider:  PA    We recommend signing up for the patient portal called MyChart.  Sign up information is provided on this After Visit Summary.  MyChart is used to connect with patients for Virtual Visits (Telemedicine).  Patients are able to view lab/test results, encounter notes, upcoming appointments, etc.  Non-urgent messages can be sent to your provider as well.   To learn more about what you can do with MyChart, go to [email or web address].

## 2024-05-13 NOTE — Progress Notes (Addendum)
 Reviewed patient chart for pre-op interview for surgery on 09-30-20258 by Dr Lovie.  Noted patient complex medical history with recent hospital admission, ED visit , and pt recent cardiology clearance just obtained this afternoon by [name] in epic.  Pt is bedridden, unable to walk (uses Chief Operating Officer), and dependent for all ADLs.  During admission 08/ 2025 symptomatic anemia received 3 units PRBCs at discharge Hg 7.6  and ED visit (last lab work done 05-07-2024 Hg 7.2, HCT 24.2, RBC 2.49).  Per [name] note patient stopped taking ASA and plavix  after discharge due to hematuria.  Due patient status unable to obtain stress testing last echo 05-07-2024 mild peridardial effusion without evidence tamponade so suggested no further testing since not having any unstable symptoms and surgery is not a high risk procedure.  Full chart review w/ anesthesia , Dr [name] MDA.  Dr [name] needs to be in short stay area for pre-op and let Dr Lovie know that patient would probably be possible admission. Will be called tomorrow since they are closed now. Called and spoke w/ Holley Learn RN/ AD about Dr [name] recommendation was told to what to put in [facility] teams chat  and special needs in patient case pt [facility] stay and pacu but will stay in OR 9 for [facility] OR staff.

## 2024-05-13 NOTE — Telephone Encounter (Signed)
 Copied from CRM [id number]. Topic: Clinical - Medical Advice >> May 13, 2024  2:24 PM Corin V wrote: Reason for CRM: Inocente, occupational therapist with [facility] called to advise that she has not been able to make contact with patient to go out to complete the occupational therapy with patient. She stated physical therapy has been out but she doesn't think they have done repeat visits. She wanted to make office aware.

## 2024-05-20 NOTE — Progress Notes (Addendum)
 For Anesthesia: PCP - Fairy Pool, DO last office visit note 10/03/23 in [facility] Cardiologist - Wilbert Bihari, MD, Lavona Agent, MD last office visit note 05/13/24 in [facility]  Bowel Prep reminder:N/A  Chest x-ray - 05/07/24 in [facility] EKG - 05/13/24 in [facility] Stress Test - N/A ECHO - 05/07/24 in [facility] Cardiac Cath - 07/22/20 Pacemaker/ICD device last checked: N/A Pacemaker orders received: N/A  Device Rep notified:N/A  Spinal Cord Stimulator: N/A  Sleep Study - N/A CPAP - N/A  Fasting Blood Sugar - N/A Checks Blood Sugar __N/A___ times a day Date and result of last Hgb A1c-N/A  Last dose of GLP1 agonist- N/A GLP1 instructions: Hold 7 days prior to schedule (Hold 24 hours-daily)   Last dose of SGLT-2 inhibitors- N/A SGLT-2 instructions: Hold 72 hours prior to surgery  Blood Thinner Instructions: Plavix  Last Dose:05/28/24 Time last taken: 10 AM  Aspirin  Instructions: ASA 81 mg Last Dose: 05/28/24 Time last taken: 10 AM  Activity level: Bedridden    Anesthesia review: CAD s/p inferior lateral STEMI and postcardiac arrest/VF 07/2020 , Ischemic cardiomyopathy, CKD  III, pericardial effusion, anoxic brain injury, prolonged QT on EKG 05/13/24  Patient denies shortness of breath, fever, cough and chest pain at PAT appointment   Patient verbalized understanding of instructions that were reviewed over the telephone.

## 2024-05-21 ENCOUNTER — Encounter (HOSPITAL_COMMUNITY): Payer: Self-pay | Admitting: Urology

## 2024-05-24 ENCOUNTER — Encounter (HOSPITAL_COMMUNITY): Payer: Self-pay | Admitting: Urology

## 2024-05-24 ENCOUNTER — Other Ambulatory Visit: Payer: Self-pay

## 2024-05-25 ENCOUNTER — Encounter (HOSPITAL_COMMUNITY): Payer: Self-pay | Admitting: Urology

## 2024-05-25 NOTE — Progress Notes (Signed)
 Case: [id number] Date/Time: 06/02/24 1115   Procedures:      CYSTOSCOPY/URETEROSCOPY/HOLMIUM LASER/STENT PLACEMENT (Left)     CYSTOSCOPY, FLEXIBLE, WITH STENT REPLACEMENT (Right) - RIGHT URETERAL STENT EXCHANGE   Anesthesia type: General   Diagnosis: Renal stone [N20.0]   Pre-op diagnosis: BILATERAL RENAL STONES   Location: [facility] PROCEDURE [facility] / [facility]   Surgeons: Lovie Arlyss CROME, MD       DISCUSSION: Antonio Navarro is a 63 yo male with PMH of former smoking, HTN, hx of STEMI (2021)  and CAD s/p PCI to circumflex (2021), ischemic CM with recovered EF, moderate pericardial effusion, hx of anoxic brain injury 2/2 cardiac arrest (pt mobilizes with wheelchair, needs hoyer lift), GERD, dysphagia, kidney stones, CKD3, anemia, stage 3 prostate cancer s/p XRT (2023).  Patient admitted from 8/15-8/21 due to AMS due to UTI, AKI, hematuria with acute on chronic anemia, and kidney stones. Urology performed cystoscopy with clot evacuation and bilateral JJ stent placement. 1 cm stone of left ureter was unable to be removed.  Urology recommended continued bladder irrigation, empiric IV CTX, and holding ASA and Plavix  while inpatient. He received a blood transfusion at that time. Hgb on discharge was 7.6.   Patient with extensive cardiac hx. Had a STEMI in 2021 which led to cardiac arrest and anoxic brain injury. Ultimately had stenting of circumflex. Had associated ischemic CM with EF 40-45% and was started on GDMT and has recovered EF. Echo as part of pre op w/u ordered on 9/16 showed moderate pericardial effusion. Patient then presented to the ED on 9/19 for SOB. Cardiology consulted due to moderate pericardial effusion and T wave inversions on EKG. Repeat Echo ordered and showed normal LVEF 60-65%, mild LVH, moderate pericardial effusion which was stable, no tamponade, no significant valvular disease. Patient followed up with Dr. Lavona on 05/13/24. Pre op risk assessment done:   Preop: It would be extremely  difficult to do any preoperative screening with him.  He had urologic procedure done with sedation a few months ago and he did well with this.  He is not having any unstable symptoms.  I would not suggest further cardiovascular testing be attempted since he would not be able to comply with perfusion imaging and he has no unstable symptoms and this is not a high risk procedure.  I had this conversation with the patient's girlfriend.  Initial plan was for case to be done at outpatient surgery center however per Dr [name] patient may need to be admitted post op and case was moved to [facility]  LD Plavix : 10/10  VS: Ht 5' 11 (1.803 m)   Wt 65.8 kg   BMI 20.22 kg/m   PROVIDERS: [name], DO   LABS: Obtain DOS. Hgb was 7.5 on 9/19. SCr 1.8 which is stable.   CXR 05/07/24:   FINDINGS:   LINES, TUBES AND DEVICES: Overlapping cardiac leads.   LUNGS AND PLEURA: Underinflation. No focal pulmonary opacity. No pulmonary edema. No pleural effusion. No pneumothorax.   HEART AND MEDIASTINUM: Calcified aorta. No acute abnormality of the cardiac and mediastinal silhouettes.   BONES AND SOFT TISSUES: No acute osseous abnormality.   IMPRESSION: 1. No acute findings.  EKG 05/13/24:  Normal sinus rhythm T wave abnormality, consider inferolateral ischemia Prolonged QT When compared with ECG of 07-May-2024 10:46, No significant change since last tracing  Echo 05/07/24:  IMPRESSIONS    1. Left ventricular ejection fraction, by estimation, is 60 to 65%. The left ventricle has normal function. There  is mild left ventricular hypertrophy.  2. Right ventricular systolic function is mildly reduced. The right ventricular size is mildly enlarged.  3. Limited study No full doppler or color flow Moderate pericardial effusion nearly circumferential. No tamponade with dilated RV no diastolic collapse and normal diameter IVC. Suggest f/u echo in 4-6 weeks depending on clinical status Dr  Santo has seen echo and patient . Moderate pericardial effusion.  4. The mitral valve is abnormal. Trivial mitral valve regurgitation.  5. The aortic valve is tricuspid. There is mild calcification of the aortic valve. There is mild thickening of the aortic valve. Aortic valve sclerosis is present, with no evidence of aortic valve stenosis.  6. The inferior vena cava is normal in size with greater than 50% respiratory variability, suggesting right atrial pressure of 3 mmHg.  LHC 07/22/2020:  Prox RCA lesion is 40% stenosed. Mid Cx lesion is 95% stenosed. A drug-eluting stent was successfully placed using a STENT RESOLUTE ONYX 2.25X12. Post intervention, there is a 0% residual stenosis. Past Medical History:  Diagnosis Date   Anemia due to chronic blood loss    last Hg in epic on 05-07-2024  7.2  ED visit ,  refused transfusion   Anoxic brain injury (HCC) 07/22/2020   cardiac arrest for 10 minutes prior to resuscitationed by EMS , residual unable to write, gait instability unable to walk (uses wheelchair)/  bedridden/  nonverble  but can nod his head   Bedridden    residual from brain injury ,  uses wheelchair/   and uses hoyour lift   Bilateral nephrolithiasis 03/2024   left ureteral stone;   right ureteral and renal stones  s/p bilateral stent placements 04-02-2024   CAD (coronary artery disease), native coronary artery 07/2020   primary cardiologist--- dr t. 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   Dysphagia    intermittant   d/t brain injury   Family history of adverse reaction to anesthesia    sister--  hard to wake   GERD (gastroesophageal reflux disease)    Hematuria    History of blood transfusion 2025   History of external beam radiation therapy 02/2022   prostated cancer ;   radiation onologist--- dr patrcia;   IMRT  03-11-2022  to  04-10-2022  pt wanted to stop after 17 out 30 treatments   History of kidney stones    History of ST elevation myocardial infarction (STEMI) 07/22/2020   inferolateral ---  VDRF  shocked 6 times by EMS, intubated,  ef 40%,  post PCI Stent to CFx, 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;   pt had prolonged hospitalization   HLD (hyperlipidemia)    Hypertension    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   Incontinence of bowel    Ischemic cardiomyopathy 07/2020   07-22-2020  EF 40% at time of MI;   08-06-2021  ef 55-60% per echo   Malignant neoplasm prostate [facility]) 10/2021   primary urologist-- dr borden/  oncologist--- dr federico;  dx 03/ 2023,  Gleason 5+4, PSA 10-81;    Castrate senstive ;   IMRT stopped at 17 out of 30 pt refused;   current treatment ADT   Myocardial infarction [facility])    Nonverbal  pt is able to communicate needs with nodding head with yes and no questions   Nonverbal    Due to anoxic brain damage   Pericardial effusion without cardiac tamponade 05/04/2024   echo showed moderate without tomponade cardiology sent pt to ED 05-07-2024 for another echo & assessment,  showed mild without tamponade   Protein calorie malnutrition    Requires assistance with all activities of daily living (ADL)    total care ;   bedriden;  need of hoyer lift out of bed to wheelchair   S/P drug eluting coronary stent placement 07/22/2021   DES x 1 to midCFx   Symptomatic anemia 03/2024   during admission x3 PRCs,  at discharge Hg 7.6   Tongue biting    and lip biting   Urinary incontinence    uses depends   Uses wheelchair    uses wheelchair due to unable to walk   Ventricular fibrillation (HCC) 07/2020    Past Surgical History:  Procedure Laterality Date   CORONARY/GRAFT ACUTE MI REVASCULARIZATION N/A 07/22/2020   Procedure: Coronary/Graft Acute MI Revascularization;  Surgeon: Court Dorn PARAS, MD;   Location: [facility] INVASIVE CV LAB;  Service: Cardiovascular;  Laterality: N/A;   CYSTOSCOPY W/ URETERAL STENT PLACEMENT Bilateral 04/02/2024   Procedure: CYSTOSCOPY, WITH RETROGRADE PYELOGRAM AND BILATERAL  URETERAL STENT INSERTION;  Surgeon: Devere Lonni Righter, MD;  Location: [facility] OR;  Service: Urology;  Laterality: Bilateral;  CLOT EVACUATION AND STENT PLACEMENT, BILATERAL   GOLD SEED IMPLANT N/A 02/26/2022   Procedure: GOLD SEED IMPLANT;  Surgeon: Devere Lonni Righter, MD;  Location: [facility];  Service: Urology;  Laterality: N/A;  30 MINS FOR THIS CASE   IR GASTROSTOMY TUBE MOD SED  08/17/2020   IR GASTROSTOMY TUBE REMOVAL  11/08/2020   LEFT HEART CATH AND CORONARY ANGIOGRAPHY N/A 07/22/2020   Procedure: LEFT HEART CATH AND CORONARY ANGIOGRAPHY;  Surgeon: Court Dorn PARAS, MD;  Location: [facility] INVASIVE CV LAB;  Service: Cardiovascular;  Laterality: N/A;   PROSTATE BIOPSY N/A 11/13/2021   Procedure: BIOPSY TRANSRECTAL ULTRASONIC PROSTATE (TUBP);  Surgeon: Lovie Arlyss CROME, MD;  Location: [facility];  Service: Urology;  Laterality: N/A;  45 MINS FOR THIS CASE   RADIOLOGY WITH ANESTHESIA N/A 10/16/2021   Procedure: MRI WITH ANESTHESIA OF PROSTATE WITH AND WITHOUT CONTRAST;  Surgeon: Radiologist, Medication, MD;  Location: [facility] OR;  Service: Radiology;  Laterality: N/A;   SPACE OAR INSTILLATION N/A 02/26/2022   Procedure: SPACE OAR INSTILLATION;  Surgeon: Devere Lonni Righter, MD;  Location: [facility];  Service: Urology;  Laterality: N/A;    MEDICATIONS: No current facility-administered medications for this encounter.    acetaminophen  (TYLENOL ) 500 MG tablet   aspirin  81 MG chewable tablet   aspirin  81 MG chewable tablet   atorvastatin  (LIPITOR ) 80 MG tablet   clopidogrel  (PLAVIX ) 75 MG tablet   enzalutamide  (XTANDI ) 40 MG tablet   feeding supplement (ENSURE PLUS HIGH PROTEIN) LIQD   losartan  (COZAAR ) 25 MG tablet   metoprolol  succinate (TOPROL -XL) 25  MG 24 hr tablet   ondansetron  (ZOFRAN -ODT) 4 MG disintegrating tablet   polyethylene glycol (MIRALAX  / GLYCOLAX ) 17 g packet   Aislynn Cifelli M Wisdom Rickey, PA-C [facility] Short Stay/Anesthesiology [facility] Phone [phone] 05/25/2024 9:26 AM

## 2024-05-25 NOTE — Anesthesia Preprocedure Evaluation (Addendum)
 Anesthesia Evaluation  Patient identified by MRN, date of birth, ID band Patient awake    Reviewed: Allergy & Precautions, NPO status , Patient's Chart, lab work & pertinent test results, reviewed documented beta blocker date and time   History of Anesthesia Complications Negative for: history of anesthetic complications  Airway Mallampati: III       Dental  (+) Teeth Intact, Dental Advisory Given, Poor Dentition   Pulmonary former smoker   breath sounds clear to auscultation       Cardiovascular hypertension, + CAD, + Past MI, + Cardiac Stents and +CHF   Rhythm:Regular Rate:Normal     Neuro/Psych    GI/Hepatic ,GERD  ,,  Endo/Other    Renal/GU Renal disease     Musculoskeletal   Abdominal   Peds  Hematology  (+) Blood dyscrasia, anemia   Anesthesia Other Findings   Reproductive/Obstetrics                              Anesthesia Physical Anesthesia Plan  ASA: 4  Anesthesia Plan: General   Post-op Pain Management:    Induction: Intravenous  PONV Risk Score and Plan: 1 and Ondansetron , Aprepitant and Treatment may vary due to age or medical condition  Airway Management Planned: LMA  Additional Equipment:   Intra-op Plan:   Post-operative Plan: Extubation in OR  Informed Consent:      Dental advisory given  Plan Discussed with: CRNA and Surgeon  Anesthesia Plan Comments: (See PAT note from 10/7)         Anesthesia Quick Evaluation

## 2024-05-26 ENCOUNTER — Inpatient Hospital Stay

## 2024-05-26 ENCOUNTER — Inpatient Hospital Stay: Admitting: Physician Assistant

## 2024-06-02 ENCOUNTER — Ambulatory Visit (HOSPITAL_COMMUNITY)

## 2024-06-02 ENCOUNTER — Encounter (HOSPITAL_COMMUNITY): Payer: Self-pay | Admitting: Urology

## 2024-06-02 ENCOUNTER — Ambulatory Visit (HOSPITAL_COMMUNITY): Payer: Self-pay | Admitting: Medical

## 2024-06-02 ENCOUNTER — Ambulatory Visit (HOSPITAL_COMMUNITY)
Admission: RE | Admit: 2024-06-02 | Discharge: 2024-06-02 | Disposition: A | Discharge status: Home / Self Care | Attending: Urology | Admitting: Urology

## 2024-06-02 ENCOUNTER — Encounter (HOSPITAL_COMMUNITY)
Admission: RE | Disposition: A | Discharge status: Home / Self Care | Payer: Self-pay | Source: Home / Self Care | Attending: Urology

## 2024-06-02 DIAGNOSIS — I509 Heart failure, unspecified: Secondary | ICD-10-CM | POA: Insufficient documentation

## 2024-06-02 DIAGNOSIS — I252 Old myocardial infarction: Secondary | ICD-10-CM | POA: Insufficient documentation

## 2024-06-02 DIAGNOSIS — K219 Gastro-esophageal reflux disease without esophagitis: Secondary | ICD-10-CM | POA: Diagnosis not present

## 2024-06-02 DIAGNOSIS — I130 Hypertensive heart and chronic kidney disease with heart failure and stage 1 through stage 4 chronic kidney disease, or unspecified chronic kidney disease: Secondary | ICD-10-CM | POA: Insufficient documentation

## 2024-06-02 DIAGNOSIS — N200 Calculus of kidney: Secondary | ICD-10-CM

## 2024-06-02 DIAGNOSIS — I2510 Atherosclerotic heart disease of native coronary artery without angina pectoris: Secondary | ICD-10-CM | POA: Diagnosis not present

## 2024-06-02 DIAGNOSIS — I10 Essential (primary) hypertension: Secondary | ICD-10-CM

## 2024-06-02 DIAGNOSIS — N183 Chronic kidney disease, stage 3 unspecified: Secondary | ICD-10-CM | POA: Insufficient documentation

## 2024-06-02 DIAGNOSIS — N202 Calculus of kidney with calculus of ureter: Secondary | ICD-10-CM | POA: Diagnosis not present

## 2024-06-02 DIAGNOSIS — D62 Acute posthemorrhagic anemia: Secondary | ICD-10-CM

## 2024-06-02 DIAGNOSIS — Z87891 Personal history of nicotine dependence: Secondary | ICD-10-CM | POA: Diagnosis not present

## 2024-06-02 DIAGNOSIS — Z955 Presence of coronary angioplasty implant and graft: Secondary | ICD-10-CM | POA: Insufficient documentation

## 2024-06-02 HISTORY — DX: Need for assistance with personal care: Z74.1

## 2024-06-02 HISTORY — DX: Hematuria, unspecified: R31.9

## 2024-06-02 HISTORY — DX: Chronic kidney disease, stage 3 unspecified: N18.30

## 2024-06-02 HISTORY — DX: Other diseases of tongue: K14.8

## 2024-06-02 HISTORY — DX: Full incontinence of feces: R15.9

## 2024-06-02 HISTORY — DX: Contracture, unspecified hand: M24.549

## 2024-06-02 HISTORY — PX: CYSTOSCOPY/URETEROSCOPY/HOLMIUM LASER/STENT PLACEMENT: SHX6546

## 2024-06-02 HISTORY — DX: Bed confinement status: Z74.01

## 2024-06-02 HISTORY — DX: Unspecified protein-calorie malnutrition: E46

## 2024-06-02 HISTORY — DX: Aphasia: R47.01

## 2024-06-02 HISTORY — DX: Iron deficiency anemia secondary to blood loss (chronic): D50.0

## 2024-06-02 HISTORY — PX: CYSTOSCOPY W/ URETERAL STENT PLACEMENT: SHX1429

## 2024-06-02 LAB — CBC
HCT: 29.4 % — ABNORMAL LOW (ref 39.0–52.0)
Hemoglobin: 8.5 g/dL — ABNORMAL LOW (ref 13.0–17.0)
MCH: 27.3 pg (ref 26.0–34.0)
MCHC: 28.9 g/dL — ABNORMAL LOW (ref 30.0–36.0)
MCV: 94.5 fL (ref 80.0–100.0)
Platelets: 182 K/uL (ref 150–400)
RBC: 3.11 MIL/uL — ABNORMAL LOW (ref 4.22–5.81)
RDW: 15 % (ref 11.5–15.5)
WBC: 5.8 K/uL (ref 4.0–10.5)
nRBC: 0 % (ref 0.0–0.2)

## 2024-06-02 LAB — BASIC METABOLIC PANEL WITH GFR
Anion gap: 10 (ref 5–15)
BUN: 23 mg/dL (ref 8–23)
CO2: 23 mmol/L (ref 22–32)
Calcium: 9.8 mg/dL (ref 8.9–10.3)
Chloride: 105 mmol/L (ref 98–111)
Creatinine, Ser: 1.69 mg/dL — ABNORMAL HIGH (ref 0.61–1.24)
GFR, Estimated: 45 mL/min — ABNORMAL LOW (ref >60)
Glucose, Bld: 104 mg/dL — ABNORMAL HIGH (ref 70–99)
Potassium: 4.7 mmol/L (ref 3.5–5.1)
Sodium: 138 mmol/L (ref 135–145)

## 2024-06-02 SURGERY — CYSTOSCOPY/URETEROSCOPY/HOLMIUM LASER/STENT PLACEMENT
Anesthesia: General | Laterality: Right

## 2024-06-02 MED ORDER — CELECOXIB 200 MG PO CAPS: 200.0000 mg | ORAL_CAPSULE | Freq: Two times a day (BID) | ORAL | 0 refills | Status: AC

## 2024-06-02 MED ORDER — IOHEXOL 300 MG/ML  SOLN
INTRAMUSCULAR | Status: DC | PRN
  Administered 2024-06-02: 25 mL

## 2024-06-02 MED ORDER — CIPROFLOXACIN IN D5W 400 MG/200ML IV SOLN
400.0000 mg | INTRAVENOUS | Status: AC
  Administered 2024-06-02: 400 mg via INTRAVENOUS
  Filled 2024-06-02: qty 200

## 2024-06-02 MED ORDER — LIDOCAINE HCL (CARDIAC) PF 100 MG/5ML IV SOSY
PREFILLED_SYRINGE | INTRAVENOUS | Status: DC | PRN
  Administered 2024-06-02: 100 mg via INTRAVENOUS

## 2024-06-02 MED ORDER — TRAMADOL HCL 50 MG PO TABS: 50.0000 mg | ORAL_TABLET | Freq: Four times a day (QID) | ORAL | 0 refills | Status: AC | PRN

## 2024-06-02 MED ORDER — ONDANSETRON HCL 4 MG/2ML IJ SOLN
INTRAMUSCULAR | Status: AC
  Filled 2024-06-02: qty 2

## 2024-06-02 MED ORDER — NITROFURANTOIN MONOHYD MACRO 100 MG PO CAPS: 100.0000 mg | ORAL_CAPSULE | Freq: Two times a day (BID) | ORAL | 0 refills | Status: AC

## 2024-06-02 MED ORDER — SODIUM CHLORIDE 0.9 % IR SOLN
Status: DC | PRN
  Administered 2024-06-02: 3000 mL

## 2024-06-02 MED ORDER — CHLORHEXIDINE GLUCONATE 0.12 % MT SOLN: 15.0000 mL | Freq: Once | OROMUCOSAL | Status: DC

## 2024-06-02 MED ORDER — LACTATED RINGERS IV SOLN: INTRAVENOUS | Status: DC

## 2024-06-02 MED ORDER — PROPOFOL 10 MG/ML IV BOLUS
INTRAVENOUS | Status: DC | PRN
  Administered 2024-06-02: 70 mg via INTRAVENOUS
  Administered 2024-06-02: 30 mg via INTRAVENOUS
  Administered 2024-06-02: 20 mg via INTRAVENOUS

## 2024-06-02 MED ORDER — ORAL CARE MOUTH RINSE: 15.0000 mL | Freq: Once | OROMUCOSAL | Status: DC

## 2024-06-02 MED ORDER — ONDANSETRON HCL 4 MG/2ML IJ SOLN
INTRAMUSCULAR | Status: DC | PRN
  Administered 2024-06-02: 4 mg via INTRAVENOUS

## 2024-06-02 MED ORDER — PHENYLEPHRINE HCL-NACL 20-0.9 MG/250ML-% IV SOLN
INTRAVENOUS | Status: DC | PRN
  Administered 2024-06-02: 40 ug/min via INTRAVENOUS

## 2024-06-02 MED ORDER — FENTANYL CITRATE (PF) 100 MCG/2ML IJ SOLN
INTRAMUSCULAR | Status: AC
  Filled 2024-06-02: qty 2

## 2024-06-02 MED ORDER — PROPOFOL 10 MG/ML IV BOLUS
INTRAVENOUS | Status: AC
  Filled 2024-06-02: qty 20

## 2024-06-02 MED ORDER — FENTANYL CITRATE (PF) 100 MCG/2ML IJ SOLN
INTRAMUSCULAR | Status: DC | PRN
  Administered 2024-06-02: 25 ug via INTRAVENOUS

## 2024-06-02 MED ORDER — PHENYLEPHRINE 80 MCG/ML (10ML) SYRINGE FOR IV PUSH (FOR BLOOD PRESSURE SUPPORT)
PREFILLED_SYRINGE | INTRAVENOUS | Status: DC | PRN
  Administered 2024-06-02: 120 ug via INTRAVENOUS
  Administered 2024-06-02 (×7): 80 ug via INTRAVENOUS

## 2024-06-02 SURGICAL SUPPLY — 22 items
BAG COUNTER SPONGE SURGICOUNT (BAG) IMPLANT
BAG URO CATCHER STRL LF (MISCELLANEOUS) ×2 IMPLANT
BASKET ZERO TIP NITINOL 2.4FR (BASKET) IMPLANT
CATH URETERAL DUAL LUMEN 10F (MISCELLANEOUS) ×2 IMPLANT
CATH URETL OPEN 5X70 (CATHETERS) ×2 IMPLANT
CATH URETL OPEN END 6FR 70 (CATHETERS) IMPLANT
CLOTH BEACON ORANGE TIMEOUT ST (SAFETY) ×2 IMPLANT
GLOVE BIO SURGEON STRL SZ7 (GLOVE) ×2 IMPLANT
GLOVE SS BIOGEL STRL SZ 7 (GLOVE) ×2 IMPLANT
GOWN STRL REUS W/ TWL XL LVL3 (GOWN DISPOSABLE) ×2 IMPLANT
GUIDEWIRE STR DUAL SENSOR (WIRE) IMPLANT
GUIDEWIRE ZIPWRE .038 STRAIGHT (WIRE) ×2 IMPLANT
IV NS 1000ML BAXH (IV SOLUTION) ×2 IMPLANT
KIT TURNOVER KIT A (KITS) ×2 IMPLANT
MANIFOLD NEPTUNE II (INSTRUMENTS) ×2 IMPLANT
PACK CYSTO (CUSTOM PROCEDURE TRAY) ×2 IMPLANT
SHEATH NAVIGATOR HD 11/13X28 (SHEATH) IMPLANT
SHEATH NAVIGATOR HD 11/13X36 (SHEATH) IMPLANT
STENT URET 6FRX24 CONTOUR (STENTS) IMPLANT
TRACTIP FLEXIVA PULS ID 200XHI (Laser) IMPLANT
TUBING CONNECTING 10 (TUBING) ×2 IMPLANT
TUBING UROLOGY SET (TUBING) ×2 IMPLANT

## 2024-06-02 NOTE — Anesthesia Postprocedure Evaluation (Signed)
 Anesthesia Post Note  Patient: Antonio Navarro  Procedure(s) Performed: BILATERAL CYSTOSCOPY/URETEROSCOPY/HOLMIUM LASER/STENT PLACEMENT (Left) CYSTOSCOPY, FLEXIBLE, WITH BILATERAL STENT REPLACEMENT (Right)     Patient location during evaluation: PACU Anesthesia Type: General Level of consciousness: awake Pain management: pain level controlled Vital Signs Assessment: post-procedure vital signs reviewed and stable Respiratory status: spontaneous breathing Cardiovascular status: blood pressure returned to baseline Postop Assessment: no apparent nausea or vomiting Anesthetic complications: no   No notable events documented.  Last Vitals:  Vitals:   06/02/24 1410 06/02/24 1415  BP: 138/82   Pulse:    Resp: 14 15  Temp: 36.5 C 36.5 C  SpO2:      Last Pain:  Vitals:   06/02/24 1415  TempSrc:   PainSc: 0-No pain                 Lauraine KATHEE [place]

## 2024-06-02 NOTE — Op Note (Signed)
 Operative Note  Preoperative diagnosis:  1.  Left ureteral and renal stones 2. Right renal stones  Postoperative diagnosis: 1.  same  Procedure(s): 1.  Bilateral ureteroscopy with laser lithotripsy of stones 2. Bilateral retrograde pyelogram 3. Bilateral ureteral stent placement  Surgeon: Herlene Foot, MD  Assistants:  None  Anesthesia:  General  Complications:  None  EBL: Minimal  Specimens: 1. none  Drains/Catheters: 1.  Bilateral 6Fr x 24cm ureteral stent without string  Intraoperative findings:   Cystoscopy demonstratedtrilobar prostate enlargement Left Ureteroscopy demonstrated stones diffusely throughout kidney. Similar on the right. These stones were dusted with the laser Successful stent placement bilaterally  Bilateral Retrograde Pyelogram: no hydronephrosis on either side, no filling defects  Description of procedure: After informed consent was obtained from the patient, the patient was identified and taken to the operating room and placed in the supine position.  General anesthesia was administered as well as perioperative IV antibiotics.  At the beginning of the case, a time-out was performed to properly identify the patient, the surgery to be performed, and the surgical site.  Sequential compression devices were applied to the lower extremities at the beginning of the case for DVT prophylaxis.  The patient was then placed in the dorsal lithotomy supine position, prepped and draped in sterile fashion.  We then passed the 21-French rigid cystoscope through the urethra and into the bladder under vision without any difficulty, noting a mildly obstructing prostate with a small median lobe.  A systematic evaluation of the bladder revealed no evidence of any suspicious bladder lesions.  Ureteral orifices were in normal position.    The distal aspect of the ureteral stent was seen protruding from the leftureteral orifice.  We then used the alligator-tooth forceps and  grasped the distal end of the ureteral stent and brought it out the urethral meatus while watching the proximal coil straighten out nicely on fluoroscopy. Through the ureteral stent, we then passed a 0.038 glide wire up to the level of the renal pelvis.  The ureteral stent was then removed, leaving the glide wire up the ureter.  The cystoscope was withdrawn, and a dual lumen catheter was inserted over the glide wire into the distal ureter. A gentle retrograde pyelogram was performed, revealing a normal caliber ureter without any filling defects. There was no hydronephrosis of the collecting system. A 0.038 sensor wire was then passed up to the level of the renal pelvis and secured to the drape as a safety wire. The dual lumen was removed.  An 11/13Fr ureteral access sheath was carefully advanced up the ureter to the level of the UPJ over this wire under fluoroscopic guidance. The flexible ureteroscope was advanced into the collecting system via the sheath. The collecting system was inspected. There were multiple sizable stones in different calyces. Using the 242 micron holmium laser fiber, the stone was dusted completely. These were sent for chemical analysis. With the ureteroscope in the kidney, a gentle pyelogram was performed to delineate the calyceal system and we evaluated the calyces systematically. We encountered no further stone fragments >4mm. The rest of the stone fragments were very tiny and these were  irrigated away gently. The calyces were re-inspected and there were no significant stone fragment residual.   We then withdrew the ureteroscope back down the ureter along with the access sheath, noting no evidence of any stones along the course of the ureter.  Prior to removing the ureteroscope, we did pass the Glidewire back up to the ureter to  the renal pelvis.  Once the ureteroscope was removed, we then used the Glidewire under direct vision + fluoroscopic guidance and passed up a 6-French x 24 cm  double-pigtail ureteral stent up the ureter, making sure that the proximal and distal ends coiled within the kidney and bladder respectively.  The procedure was repeated in an identical fashion on the right. Similar findings In the kidney - multiple sizable stones in different calyces. These were dusted  .The cystoscope was then advanced back into the bladder under vision.  We were able to see the distal stents coiling nicely within the bladder.  The bladder was then emptied with irrigation solution.  The cystoscope was then removed.    The patient tolerated the procedure well and there was no complication. Patient was awoken from anesthesia and taken to the recovery room in stable condition. I was present and scrubbed for the entirety of the case.  Plan:  Patient will be discharged home; f/u in ~2 weeks to remove stents   G. Herlene Foot MD [facility]  Pager: [phone]

## 2024-06-02 NOTE — H&P (Signed)
 H&P  History of Present Illness: Antonio Navarro is a 63 y.o. year old M who presents today for treatment of bilateral (left ureteral + renal, right renal) stones  No acute complaints. Patient is s/p bilateral ureteral stent placement in August  Past Medical History:  Diagnosis Date   Anemia due to chronic blood loss    last Hg in epic on 05-07-2024  7.2  ED visit ,  refused transfusion   Anoxic brain injury (HCC) 07/22/2020   cardiac arrest for 10 minutes prior to resuscitationed by EMS , residual unable to write, gait instability unable to walk (uses wheelchair)/  bedridden/  nonverble  but can nod his head   Bedridden    residual from brain injury ,  uses wheelchair/   and uses hoyour lift   Bilateral nephrolithiasis 03/2024   left ureteral stone;   right ureteral and renal stones  s/p bilateral stent placements 04-02-2024   CAD (coronary artery disease), native coronary artery 07/2020   primary cardiologist--- dr t. 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   Dysphagia    intermittant   d/t brain injury   Family history of adverse reaction to anesthesia    sister--  hard to wake   GERD (gastroesophageal reflux disease)    Hematuria    History of blood transfusion 2025   History of external beam radiation therapy 02/2022   prostated cancer ;  radiation onologist--- dr patrcia;   IMRT  03-11-2022  to  04-10-2022  pt wanted to stop after 17 out 30 treatments   History of kidney stones    History of ST elevation myocardial infarction (STEMI) 07/22/2020   inferolateral ---  VDRF  shocked 6 times by EMS, intubated,  ef 40%,  post PCI Stent to CFx, 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;   pt had prolonged hospitalization   HLD (hyperlipidemia)    Hypertension    Impaired memory     secondary to brain injury   Incontinence of bowel    Ischemic cardiomyopathy 07/2020   07-22-2020  EF 40% at time of MI;   08-06-2021  ef 55-60% per echo   Malignant neoplasm [facility]) 10/2021   primary urologist-- dr borden/  oncologist--- dr federico;  dx 03/ 2023,  Gleason 5+4, PSA 10-81;    Castrate senstive ;   IMRT stopped at 17 out of 30 pt refused;   current treatment ADT   Myocardial infarction [facility])    Nonverbal    pt is able to communicate needs with nodding head with yes and no questions   Nonverbal    Due to anoxic brain damage   Pericardial effusion without cardiac tamponade 05/04/2024   echo showed moderate without tomponade cardiology sent pt to ED 05-07-2024 for another echo & assessment,  showed mild without tamponade   Protein calorie malnutrition    Requires assistance with all activities of daily living (ADL)    total care ;   bedriden;  need of hoyer lift out of bed to wheelchair   S/P drug eluting coronary stent placement 07/22/2021   DES x 1 to midCFx   Symptomatic anemia 03/2024   during admission x3 PRCs,  at discharge Hg 7.6   Tongue biting  and lip biting   Urinary incontinence    uses depends   Uses wheelchair    uses wheelchair due to unable to walk   Ventricular fibrillation (HCC) 07/2020    Past Surgical History:  Procedure Laterality Date   CORONARY/GRAFT ACUTE MI REVASCULARIZATION N/A 07/22/2020   Procedure: Coronary/Graft Acute MI Revascularization;  Surgeon: Court Dorn PARAS, MD;  Location: [facility] INVASIVE CV LAB;  Service: Cardiovascular;  Laterality: N/A;   CYSTOSCOPY W/ URETERAL STENT PLACEMENT Bilateral 04/02/2024   Procedure: CYSTOSCOPY, WITH RETROGRADE PYELOGRAM AND BILATERAL  URETERAL STENT INSERTION;  Surgeon: Devere Lonni Righter, MD;  Location: [facility] OR;  Service: Urology;  Laterality: Bilateral;  CLOT EVACUATION AND STENT PLACEMENT, BILATERAL   GOLD SEED IMPLANT N/A 02/26/2022   Procedure: GOLD SEED IMPLANT;  Surgeon: Devere Lonni Righter, MD;  Location: [facility];  Service: Urology;  Laterality: N/A;  30 MINS FOR THIS CASE   IR GASTROSTOMY TUBE MOD SED  08/17/2020   IR GASTROSTOMY TUBE REMOVAL  11/08/2020   LEFT HEART CATH AND CORONARY ANGIOGRAPHY N/A 07/22/2020   Procedure: LEFT HEART CATH AND CORONARY ANGIOGRAPHY;  Surgeon: Court Dorn PARAS, MD;  Location: [facility] INVASIVE CV LAB;  Service: Cardiovascular;  Laterality: N/A;   PROSTATE BIOPSY N/A 11/13/2021   Procedure: BIOPSY TRANSRECTAL ULTRASONIC PROSTATE (TUBP);  Surgeon: Lovie Arlyss CROME, MD;  Location: [facility] ORS;  Service: Urology;  Laterality: N/A;  45 MINS FOR THIS CASE   RADIOLOGY WITH ANESTHESIA N/A 10/16/2021   Procedure: MRI WITH ANESTHESIA OF PROSTATE WITH AND WITHOUT CONTRAST;  Surgeon: Radiologist, Medication, MD;  Location: [facility] OR;  Service: Radiology;  Laterality: N/A;   SPACE OAR INSTILLATION N/A 02/26/2022   Procedure: SPACE OAR INSTILLATION;  Surgeon: Devere Lonni Righter, MD;  Location: [facility];  Service: Urology;  Laterality: N/A;    Home Medications:  Current Meds  Medication Sig   acetaminophen  (TYLENOL ) 500 MG tablet Take 500 mg by mouth every 6 (six) hours as needed for mild pain (pain score 1-3) or moderate pain (pain score 4-6).   aspirin  81 MG chewable tablet Chew 1 tablet (81 mg total) by mouth daily. (Patient taking differently: Chew 81 mg by mouth every morning.)   atorvastatin  (LIPITOR ) 80 MG tablet Take 1 tablet (80 mg total) by mouth daily. (Patient taking differently: Take 80 mg by mouth every evening.)   enzalutamide  (XTANDI ) 40 MG tablet Take 4 tablets (160 mg total) by mouth daily.   losartan  (COZAAR ) 25 MG tablet Take 1 tablet (25 mg total) by mouth daily. (Patient taking differently: Take 25 mg by mouth every morning.)   metoprolol  succinate (TOPROL -XL) 25 MG 24 hr tablet Take 0.5 tablets (12.5 mg total) by mouth 2 (two) times daily. Can take an additional half tablet if heart rate is  over 100   ondansetron  (ZOFRAN -ODT) 4 MG disintegrating tablet Take 4 mg by mouth every 8 (eight) hours as needed for nausea or vomiting.    Allergies: No Known Allergies  Family History  Problem Relation Age of Onset   Cancer Mother        d. age 43; unknown type   Hypertension Father    Congestive Heart Failure Father    Breast cancer Sister 80   Diabetes type II Sister    Hypertension Sister    Hypertension Brother    Autism spectrum disorder Brother    Ovarian cancer Maternal Aunt        dx before age 69   Prostate  cancer Maternal Uncle        dx after 30   Breast cancer Paternal Aunt        dx 82s   Cancer Paternal Aunt        dx after 35; unknown type   Ovarian cancer Paternal Grandmother        dx and d. mid 52s    Social History:  reports that he quit smoking about 3 years ago. His smoking use included cigarettes. He started smoking about 43 years ago. He has a 40 pack-year smoking history. He has never used smokeless tobacco. He reports that he does not currently use alcohol . He reports that he does not currently use drugs after having used the following drugs: Marijuana.  ROS: A complete review of systems was performed.  All systems are negative except for pertinent findings as noted.  Physical Exam:  Vital signs in last 24 hours: Temp:  [97 F (36.1 C)] 97 F (36.1 C) (10/15 0920) Pulse Rate:  [75] 75 (10/15 0920) Resp:  [18] 18 (10/15 0920) BP: (144)/(92) 144/92 (10/15 0920) SpO2:  [97 %] 97 % (10/15 0920) Weight:  [65.8 kg] 65.8 kg (10/15 0900) Constitutional: No acute distress Cardiovascular: Regular rate and rhythm Respiratory: Normal respiratory effort, Lungs clear bilaterally GI: Abdomen is soft, nontender, nondistended, no abdominal masses Lymphatic: No lymphadenopathy Neurologic: Grossly intact, no focal deficits Psychiatric: Normal mood and affect   Laboratory Data:  No results for input(s): WBC, HGB, HCT, PLT in the last 72  hours.  No results for input(s): NA, K, CL, GLUCOSE, BUN, CALCIUM , CREATININE in the last 72 hours.  Invalid input(s): CO3   No results found for this or any previous visit (from the past 24 hours). No results found for this or any previous visit (from the past 240 hours).  Renal Function: No results for input(s): CREATININE in the last 168 hours. CrCl cannot be calculated (Patient's most recent lab result is older than the maximum 21 days allowed.).  Radiologic Imaging: No results found.  Assessment:  MIHAILO SAGE is a 63 y.o. year old M with bilateral stones  Plan:  --to OR as planned for left ureteroscopy with laser litho, stent. I will at a minimum exchange the right ureteral stent, although will try to at least start lasering the stones on this side. Procedure and risks reviewed, including but not limited to hematuria, infection, sepsis, damage to GU tract, failure to complete procedure, retained stone fragments, need for future procedures, stent pain, prolonged stent. Discussed need for a staged procedure (possibly more than one) for patient to be stone free  Herlene Foot, MD 06/02/2024, 10:49 AM  [facility] Pager: [phone]

## 2024-06-02 NOTE — Anesthesia Procedure Notes (Signed)
 Procedure Name: LMA Insertion Date/Time: 06/02/2024 11:24 AM  Performed by: Metta Andrea NOVAK, CRNAPre-anesthesia Checklist: Patient identified, Emergency Drugs available, Suction available, Patient being monitored and Timeout performed Patient Re-evaluated:Patient Re-evaluated prior to induction Oxygen Delivery Method: Circle system utilized Preoxygenation: Pre-oxygenation with 100% oxygen Induction Type: IV induction Ventilation: Mask ventilation without difficulty LMA: LMA inserted LMA Size: 5.0 Number of attempts: 1 Tube secured with: Tape Dental Injury: Teeth and Oropharynx as per pre-operative assessment

## 2024-06-02 NOTE — Transfer of Care (Signed)
 Immediate Anesthesia Transfer of Care Note  Patient: Antonio Navarro  Procedure(s) Performed: BILATERAL CYSTOSCOPY/URETEROSCOPY/HOLMIUM LASER/STENT PLACEMENT (Left) CYSTOSCOPY, FLEXIBLE, WITH BILATERAL STENT REPLACEMENT (Right)  Patient Location: PACU  Anesthesia Type:General  Level of Consciousness: drowsy and responds to stimulation  Airway & Oxygen Therapy: Patient Spontanous Breathing and Patient connected to face mask oxygen  Post-op Assessment: Report given to RN and Post -op Vital signs reviewed and stable  Post vital signs: Reviewed and stable  Last Vitals:  Vitals Value Taken Time  BP 173/94 06/02/24 13:14  Temp    Pulse 71 06/02/24 13:14  Resp 10 06/02/24 13:14  SpO2 100 % 06/02/24 13:14  Vitals shown include unfiled device data.  Last Pain:  Vitals:   06/02/24 1030  TempSrc:   PainSc: 0-No pain      Patients Stated Pain Goal: 3 (05/24/24 0835)  Complications: No notable events documented.

## 2024-06-02 NOTE — Discharge Instructions (Addendum)
[facility] [phone] Post Ureteroscopy With or Without Stent Instructions  Definitions:  Ureter: The duct that transports urine from the kidney to the bladder. Stent:   A plastic hollow tube that is placed into the ureter, from the kidney to the bladder to prevent the ureter from swelling shut.  GENERAL INSTRUCTIONS:  Despite the fact that no skin incisions were used, the area around the ureter and bladder is raw and irritated. The stent is a foreign body which will further irritate the bladder wall. This irritation is manifested by increased frequency of urination, both day and night, and by an increase in the urge to urinate. In some, the urge to urinate is present almost always. Sometimes the urge is strong enough that you may not be able to stop yourself from urinating. The only real cure is to remove the stent and then give time for the bladder wall to heal which can't be done until the danger of the ureter swelling shut has passed, which varies.  You may see some blood in your urine while the stent is in place and a few days afterwards. Do not be alarmed, even if the urine was clear for a while. Get off your feet and drink lots of fluids until clearing occurs. If you start to pass clots or don't improve, call us.  DIET: You may return to your normal diet immediately. Because of the raw surface of your bladder, alcohol, spicy foods, acid type foods and drinks with caffeine may cause irritation or frequency and should be used in moderation. To keep your urine flowing freely and to avoid constipation, drink plenty of fluids during the day ( 8-10 glasses ). Tip: Avoid cranberry juice because it is very acidic.  ACTIVITY: Your physical activity doesn't need to be restricted. However, if you are very active, you may see some blood in your urine. We suggest that you reduce your activity under these circumstances until the bleeding has stopped.  BOWELS: It is important to  keep your bowels regular during the postoperative period. Straining with bowel movements can cause bleeding. A bowel movement every other day is reasonable. Use a mild laxative if needed, such as Milk of Magnesia 2-3 tablespoons, or 2 Dulcolax tablets. Call if you continue to have problems. If you have been taking narcotics for pain, before, during or after your surgery, you may be constipated. Take a laxative if necessary.   MEDICATION: You should resume your pre-surgery medications unless told not to. In addition you will often be given an antibiotic to prevent infection and likely several as needed medications for stent related discomfort. These should be taken as prescribed until the bottles are finished unless you are having an unusual reaction to one of the drugs.  PROBLEMS YOU SHOULD REPORT TO US: Fevers over 100.5 Fahrenheit. Heavy bleeding, or clots ( See above notes about blood in urine ). Inability to urinate. Drug reactions ( hives, rash, nausea, vomiting, diarrhea ). Severe burning or pain with urination that is not improving.      

## 2024-06-03 ENCOUNTER — Encounter (HOSPITAL_COMMUNITY): Payer: Self-pay | Admitting: Urology

## 2024-06-10 ENCOUNTER — Other Ambulatory Visit: Payer: Self-pay

## 2024-06-23 ENCOUNTER — Inpatient Hospital Stay

## 2024-06-23 ENCOUNTER — Inpatient Hospital Stay: Admitting: Physician Assistant

## 2024-06-28 ENCOUNTER — Ambulatory Visit (HOSPITAL_COMMUNITY)

## 2024-06-29 ENCOUNTER — Telehealth: Payer: Self-pay | Admitting: *Deleted

## 2024-06-29 NOTE — Telephone Encounter (Signed)
 Will forward to C. Boone.  Copied from CRM [id number]. Topic: Clinical - Order For Equipment >> Jun 29, 2024  1:35 PM Cherylann RAMAN wrote: Reason for CRM: Andrienne, sister, states that patient's hospital bed has broken and she called the company and they stated that the patient now qualifies for a new bed with Medicaid. However, they are need the provider to send in a prescription/order for the hospital bed and face to face notes and narrative Faxed in to [employer] Fax:[phone]. Please contact [name] Clause, sister, at [phone] for additional information.

## 2024-07-02 NOTE — Telephone Encounter (Signed)
 Copied from CRM [id number]. Topic: Clinical - Order For Equipment >> Jun 29, 2024  1:35 PM Cherylann RAMAN wrote: Reason for CRM: Andrienne, sister, states that patient's hospital bed has broken and she called the company and they stated that the patient now qualifies for a new bed with Medicaid. However, they are need the provider to send in a prescription/order for the hospital bed and face to face notes and narrative Faxed in to [employer] Fax:[phone]. Please contact [name] Clause, sister, at [phone] for additional information. >> Jul 02, 2024  9:16 AM Graeme ORN wrote: Patient sister called back. States she has not heard back. Would like update. Advised request sent to provider. Caller states patient bed is broken and she needs to have this completed. Can someone call her back. Thank You

## 2024-07-05 ENCOUNTER — Other Ambulatory Visit: Payer: Self-pay | Admitting: Physician Assistant

## 2024-07-05 DIAGNOSIS — C61 Malignant neoplasm of prostate: Secondary | ICD-10-CM

## 2024-07-05 NOTE — Telephone Encounter (Signed)
 I talked to pt's sister to schedule virtual appt. Appt scheduled 11/20 with Dr Marylu.

## 2024-07-06 ENCOUNTER — Inpatient Hospital Stay

## 2024-07-06 ENCOUNTER — Inpatient Hospital Stay: Attending: Hematology and Oncology

## 2024-07-06 ENCOUNTER — Inpatient Hospital Stay (HOSPITAL_BASED_OUTPATIENT_CLINIC_OR_DEPARTMENT_OTHER): Admitting: Physician Assistant

## 2024-07-06 ENCOUNTER — Other Ambulatory Visit: Payer: Self-pay

## 2024-07-06 VITALS — BP 140/95 | HR 88 | Resp 16

## 2024-07-06 VITALS — BP 110/86 | HR 91 | Temp 97.5°F | Resp 18

## 2024-07-06 VITALS — BP 137/104 | HR 108 | Temp 97.8°F | Resp 16 | Wt 118.1 lb

## 2024-07-06 DIAGNOSIS — C61 Malignant neoplasm of prostate: Secondary | ICD-10-CM | POA: Insufficient documentation

## 2024-07-06 DIAGNOSIS — Z5111 Encounter for antineoplastic chemotherapy: Secondary | ICD-10-CM | POA: Diagnosis present

## 2024-07-06 DIAGNOSIS — Z79899 Other long term (current) drug therapy: Secondary | ICD-10-CM | POA: Diagnosis not present

## 2024-07-06 DIAGNOSIS — D539 Nutritional anemia, unspecified: Secondary | ICD-10-CM | POA: Diagnosis not present

## 2024-07-06 LAB — CBC WITH DIFFERENTIAL (CANCER CENTER ONLY)
Abs Immature Granulocytes: 0.01 K/uL (ref 0.00–0.07)
Basophils Absolute: 0 K/uL (ref 0.0–0.1)
Basophils Relative: 1 %
Eosinophils Absolute: 0.1 K/uL (ref 0.0–0.5)
Eosinophils Relative: 3 %
HCT: 29.6 % — ABNORMAL LOW (ref 39.0–52.0)
Hemoglobin: 9.7 g/dL — ABNORMAL LOW (ref 13.0–17.0)
Immature Granulocytes: 0 %
Lymphocytes Relative: 29 %
Lymphs Abs: 1 K/uL (ref 0.7–4.0)
MCH: 29 pg (ref 26.0–34.0)
MCHC: 32.8 g/dL (ref 30.0–36.0)
MCV: 88.4 fL (ref 80.0–100.0)
Monocytes Absolute: 0.2 K/uL (ref 0.1–1.0)
Monocytes Relative: 6 %
Neutro Abs: 2 K/uL (ref 1.7–7.7)
Neutrophils Relative %: 61 %
Platelet Count: 145 K/uL — ABNORMAL LOW (ref 150–400)
RBC: 3.35 MIL/uL — ABNORMAL LOW (ref 4.22–5.81)
RDW: 14.9 % (ref 11.5–15.5)
WBC Count: 3.4 K/uL — ABNORMAL LOW (ref 4.0–10.5)
nRBC: 0 % (ref 0.0–0.2)

## 2024-07-06 LAB — CMP (CANCER CENTER ONLY)
ALT: <5 U/L (ref 0–44)
AST: 11 U/L — ABNORMAL LOW (ref 15–41)
Albumin: 3.7 g/dL (ref 3.5–5.0)
Alkaline Phosphatase: 52 U/L (ref 38–126)
Anion gap: 7 (ref 5–15)
BUN: 18 mg/dL (ref 8–23)
CO2: 26 mmol/L (ref 22–32)
Calcium: 9.7 mg/dL (ref 8.9–10.3)
Chloride: 106 mmol/L (ref 98–111)
Creatinine: 1.56 mg/dL — ABNORMAL HIGH (ref 0.61–1.24)
GFR, Estimated: 50 mL/min — ABNORMAL LOW (ref >60)
Glucose, Bld: 156 mg/dL — ABNORMAL HIGH (ref 70–99)
Potassium: 4.3 mmol/L (ref 3.5–5.1)
Sodium: 139 mmol/L (ref 135–145)
Total Bilirubin: 0.4 mg/dL (ref 0.0–1.2)
Total Protein: 7.7 g/dL (ref 6.5–8.1)

## 2024-07-06 LAB — IRON AND IRON BINDING CAPACITY (CC-WL,HP ONLY)
Iron: 40 ug/dL — ABNORMAL LOW (ref 45–182)
Saturation Ratios: 14 % — ABNORMAL LOW (ref 17.9–39.5)
TIBC: 276 ug/dL (ref 250–450)
UIBC: 236 ug/dL

## 2024-07-06 LAB — PSA: Prostatic Specific Antigen: 0.02 ng/mL (ref 0.00–4.00)

## 2024-07-06 LAB — FERRITIN: Ferritin: 45 ng/mL (ref 24–336)

## 2024-07-06 LAB — GLUCOSE, CAPILLARY: Glucose-Capillary: 138 mg/dL — ABNORMAL HIGH (ref 70–99)

## 2024-07-06 MED ORDER — SODIUM CHLORIDE 0.9 % IV SOLN: INTRAVENOUS | Status: AC

## 2024-07-06 MED ORDER — LEUPROLIDE ACETATE (3 MONTH) 22.5 MG ~~LOC~~ KIT
22.5000 mg | PACK | Freq: Once | SUBCUTANEOUS | Status: AC
  Administered 2024-07-06: 22.5 mg via SUBCUTANEOUS
  Filled 2024-07-06: qty 22.5

## 2024-07-06 NOTE — Progress Notes (Unsigned)
 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 Noted to have clinical stage T3bN1 with regional lymph node involvement  12/25/2021: Eligard  45 mg started 01/05/2022: Zytiga  1000 mg with prednisone  5 mg daily started  02/14/2022: last visit with Dr. Amadeo. Transitioned to comfort based care after completing 6 (out of 30) radiation treatments  03/17/2023: establish care with Dr. Federico   Interval History:  Antonio Navarro 63 y.o. male with medical history significant for castrate sensitive prostate cancer who presents for a follow up visit. The patient's last visit was on 03/03/2024. In the interim since the last visit he has continued enzalutamide  therapy.  On exam today Antonio Navarro is accompanied by his family.  They report he has been well overall in the interim since our last visit.  He continues taking his enzalutamide  therapy faithfully.  His energy and appetite are unchanged. He denies nausea, vomiting or bowel habit changes. He recently underwent bilateral lithotripsy of stones and bilateral ureteral stent placement on 06/02/2024. Since then, patient has no evidence of hematuria. He denies fevers, chills, shortness of breath, chest pain or cough. Overall his health has been steady.  A full 10 point ROS is otherwise negative.  MEDICAL HISTORY:  Past Medical History:  Diagnosis Date   Anemia due to chronic blood loss    last Hg in epic on 05-07-2024  7.2  ED visit ,  refused transfusion   Anoxic brain injury (HCC) 07/22/2020   cardiac arrest for 10 minutes prior to resuscitationed by EMS , residual unable to write, gait instability unable to  walk (uses wheelchair)/  bedridden/  nonverble  but can nod his head   Bedridden    residual from brain injury ,  uses wheelchair/   and uses hoyour lift   Bilateral nephrolithiasis 03/2024   left ureteral stone;   right ureteral and renal stones  s/p bilateral stent placements 04-02-2024   CAD (coronary artery disease), native coronary artery 07/2020   primary cardiologist--- dr t. 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   Dysphagia    intermittant   d/t brain injury   Family history of adverse reaction to anesthesia    sister--  hard to wake   GERD (gastroesophageal reflux disease)    Hematuria    History of blood transfusion 2025   History of external beam radiation therapy 02/2022   prostated cancer ;  radiation onologist--- dr patrcia;   IMRT  03-11-2022  to  04-10-2022  pt wanted to stop after 17 out 30 treatments   History of kidney stones    History of ST elevation myocardial infarction (STEMI) 07/22/2020   inferolateral ---  VDRF  shocked 6 times by EMS, intubated,  ef 40%,  post PCI Stent to CFx, 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;   pt had prolonged hospitalization   HLD (hyperlipidemia)    Hypertension    Impaired memory    secondary to  brain injury   Incontinence of bowel    Ischemic cardiomyopathy 07/2020   07-22-2020  EF 40% at time of MI;   08-06-2021  ef 55-60% per echo   Malignant neoplasm prostate [facility]) 10/2021   primary urologist-- dr borden/  oncologist--- dr federico;  dx 03/ 2023,  Gleason 5+4, PSA 10-81;    Castrate senstive ;   IMRT stopped at 17 out of 30 pt refused;   current treatment ADT   Myocardial infarction [facility])    Nonverbal    pt is able to communicate needs with nodding head with yes and no questions   Nonverbal    Due to anoxic brain  damage   Pericardial effusion without cardiac tamponade 05/04/2024   echo showed moderate without tomponade cardiology sent pt to ED 05-07-2024 for another echo & assessment,  showed mild without tamponade   Protein calorie malnutrition    Requires assistance with all activities of daily living (ADL)    total care ;   bedriden;  need of hoyer lift out of bed to wheelchair   S/P drug eluting coronary stent placement 07/22/2021   DES x 1 to midCFx   Symptomatic anemia 03/2024   during admission x3 PRCs,  at discharge Hg 7.6   Tongue biting    and lip biting   Urinary incontinence    uses depends   Uses wheelchair    uses wheelchair due to unable to walk   Ventricular fibrillation (HCC) 07/2020    SURGICAL HISTORY: Past Surgical History:  Procedure Laterality Date   CORONARY/GRAFT ACUTE MI REVASCULARIZATION N/A 07/22/2020   Procedure: Coronary/Graft Acute MI Revascularization;  Surgeon: Court Dorn PARAS, MD;  Location: [facility] INVASIVE CV LAB;  Service: Cardiovascular;  Laterality: N/A;   CYSTOSCOPY W/ URETERAL STENT PLACEMENT Bilateral 04/02/2024   Procedure: CYSTOSCOPY, WITH RETROGRADE PYELOGRAM AND BILATERAL  URETERAL STENT INSERTION;  Surgeon: Devere Lonni Righter, MD;  Location: [facility] OR;  Service: Urology;  Laterality: Bilateral;  CLOT EVACUATION AND STENT PLACEMENT, BILATERAL   CYSTOSCOPY W/ URETERAL STENT PLACEMENT Right 06/02/2024   Procedure: CYSTOSCOPY, FLEXIBLE, WITH BILATERAL STENT REPLACEMENT;  Surgeon: Lovie Arlyss CROME, MD;  Location: [facility] ORS;  Service: Urology;  Laterality: Right;  RIGHT URETERAL STENT EXCHANGE   CYSTOSCOPY/URETEROSCOPY/HOLMIUM LASER/STENT PLACEMENT Left 06/02/2024   Procedure: BILATERAL CYSTOSCOPY/URETEROSCOPY/HOLMIUM LASER/STENT PLACEMENT;  Surgeon: Lovie Arlyss CROME, MD;  Location: [facility] ORS;  Service: Urology;  Laterality: Left;   GOLD SEED IMPLANT N/A 02/26/2022   Procedure: GOLD SEED IMPLANT;  Surgeon: Devere Lonni Righter, MD;  Location[facility];  Service: Urology;  Laterality: N/A;  30 MINS FOR THIS CASE   IR GASTROSTOMY TUBE MOD SED  08/17/2020   IR GASTROSTOMY TUBE REMOVAL  11/08/2020   LEFT HEART CATH AND CORONARY ANGIOGRAPHY N/A 07/22/2020   Procedure: LEFT HEART CATH AND CORONARY ANGIOGRAPHY;  Surgeon: Court Dorn PARAS, MD;  Location: [facility] INVASIVE CV LAB;  Service: Cardiovascular;  Laterality: N/A;   PROSTATE BIOPSY N/A 11/13/2021   Procedure: BIOPSY TRANSRECTAL ULTRASONIC PROSTATE (TUBP);  Surgeon: Lovie Arlyss CROME, MD;  Location: [facility] ORS;  Service: Urology;  Laterality: N/A;  45 MINS FOR THIS CASE   RADIOLOGY WITH ANESTHESIA N/A 10/16/2021   Procedure: MRI WITH ANESTHESIA OF PROSTATE WITH AND WITHOUT CONTRAST;  Surgeon: Radiologist, Medication, MD;  Location: [facility] OR;  Service: Radiology;  Laterality: N/A;   SPACE OAR INSTILLATION N/A 02/26/2022   Procedure: SPACE OAR INSTILLATION;  Surgeon: Devere Lonni Righter, MD;  Location: [facility];  Service: Urology;  Laterality: N/A;    SOCIAL HISTORY: Social History   Socioeconomic History   Marital status: Single    Spouse name: Not on file   Number of children: Not on file   Years of education: Not on file   Highest education level: Not on file  Occupational History   Not on file  Tobacco Use   Smoking status: Former    Current packs/day: 0.00    Average packs/day: 1 pack/day for 40.0 years (40.0 ttl pk-yrs)    Types: Cigarettes    Start date: 07/21/1980    Quit date: 07/21/2020    Years since quitting: 3.9   Smokeless tobacco: Never  Vaping Use   Vaping status: Never Used  Substance and Sexual Activity   Alcohol  use: Not Currently    Comment: none since 12/ 2021   Drug use: Not Currently    Types: Marijuana    Comment: none since 12/ 2021   Sexual activity: Not on file  Other Topics Concern   Not on file  Social History Narrative   ** Merged History Encounter **       Social Drivers of Health   Financial Resource Strain:  Not on file  Food Insecurity: No Food Insecurity (03/17/2023)   Hunger Vital Sign    Worried About Running Out of Food in the Last Year: Never true    Ran Out of Food in the Last Year: Never true  Transportation Needs: No Transportation Needs (03/17/2023)   PRAPARE - Administrator, Civil Service (Medical): No    Lack of Transportation (Non-Medical): No  Physical Activity: Not on file  Stress: Not on file  Social Connections: Not on file  Intimate Partner Violence: Patient Unable To Answer (03/17/2023)   Humiliation, Afraid, Rape, and Kick questionnaire    Fear of Current or Ex-Partner: Patient unable to answer    Emotionally Abused: Patient unable to answer    Physically Abused: Patient unable to answer    Sexually Abused: Patient unable to answer    FAMILY HISTORY: Family History  Problem Relation Age of Onset   Cancer Mother        d. age 54; unknown type   Hypertension Father    Congestive Heart Failure Father    Breast cancer Sister 58   Diabetes type II Sister    Hypertension Sister    Hypertension Brother    Autism spectrum disorder Brother    Ovarian cancer Maternal Aunt        dx before age 6   Prostate cancer Maternal Uncle        dx after 80   Breast cancer Paternal Aunt        dx 57s   Cancer Paternal Aunt        dx after 32; unknown type   Ovarian cancer Paternal Grandmother        dx and d. mid 66s    ALLERGIES:  has no known allergies.  MEDICATIONS:  Current Outpatient Medications  Medication Sig Dispense Refill   acetaminophen  (TYLENOL ) 500 MG tablet Take 500 mg by mouth every 6 (six) hours as needed for mild pain (pain score 1-3) or moderate pain (pain score 4-6).     aspirin  81 MG chewable tablet Chew 1 tablet (81 mg total) by mouth daily. (Patient taking differently: Chew 81 mg by mouth every morning.)     atorvastatin  (LIPITOR ) 80 MG tablet Take 1 tablet (80 mg total)  by mouth daily. (Patient taking differently: Take 80 mg by mouth every  evening.) 90 tablet 3   clopidogrel  (PLAVIX ) 75 MG tablet Take 1 tablet (75 mg total) by mouth daily. 90 tablet 3   enzalutamide  (XTANDI ) 40 MG tablet Take 4 tablets (160 mg total) by mouth daily. 120 tablet 2   feeding supplement (ENSURE PLUS HIGH PROTEIN) LIQD Take 237 mLs by mouth 2 (two) times daily between meals. 14220 mL 0   losartan  (COZAAR ) 25 MG tablet Take 1 tablet (25 mg total) by mouth daily. (Patient taking differently: Take 25 mg by mouth every morning.) 90 tablet 3   metoprolol  succinate (TOPROL -XL) 25 MG 24 hr tablet Take 0.5 tablets (12.5 mg total) by mouth 2 (two) times daily. Can take an additional half tablet if heart rate is over 100 100 tablet 1   ondansetron  (ZOFRAN -ODT) 4 MG disintegrating tablet Take 4 mg by mouth every 8 (eight) hours as needed for nausea or vomiting.     polyethylene glycol (MIRALAX  / GLYCOLAX ) 17 g packet Take 17 g by mouth daily as needed for moderate constipation. 14 each 0   aspirin  81 MG chewable tablet Chew 1 tablet (81 mg total) by mouth daily. (Patient not taking: Reported on 05/24/2024) 30 tablet 0   No current facility-administered medications for this visit.    REVIEW OF SYSTEMS:   Constitutional: ( - ) fevers, ( - )  chills , ( - ) night sweats Eyes: ( - ) blurriness of vision, ( - ) double vision, ( - ) watery eyes Ears, nose, mouth, throat, and face: ( - ) mucositis, ( - ) sore throat Respiratory: ( - ) cough, ( - ) dyspnea, ( - ) wheezes Cardiovascular: ( - ) palpitation, ( - ) chest discomfort, ( - ) lower extremity swelling Gastrointestinal:  ( - ) nausea, ( - ) heartburn, ( - ) change in bowel habits Skin: ( - ) abnormal skin rashes Lymphatics: ( - ) new lymphadenopathy, ( - ) easy bruising Neurological: ( - ) numbness, ( - ) tingling, ( - ) new weaknesses Behavioral/Psych: ( - ) mood change, ( - ) new changes  All other systems were reviewed with the patient and are negative.  PHYSICAL EXAMINATION:  Vitals:   07/06/24 1103  07/06/24 1104  BP: (!) 141/106 (!) 137/104  Pulse: (!) 108   Resp: 16   Temp: 97.8 F (36.6 C)   SpO2: 99%       Filed Weights   07/06/24 1103  Weight: 118 lb 1.6 oz (53.6 kg)     GENERAL: Chronically ill-appearing middle-aged African-American male, alert, no distress and comfortable SKIN: skin color, texture, turgor are normal, no rashes or significant lesions EYES: conjunctiva are pink and non-injected, sclera clear LUNGS: clear to auscultation and percussion with normal breathing effort HEART: regular rate & rhythm and no murmurs and no lower extremity edema Musculoskeletal: no cyanosis of digits and no clubbing  PSYCH: minimally verbal  NEURO: no focal motor/sensory deficits  LABORATORY DATA:  I have reviewed the data as listed    Latest Ref Rng & Units 07/06/2024   10:34 AM 06/02/2024   10:30 AM 05/07/2024   11:33 AM  CBC  WBC 4.0 - 10.5 K/uL 3.4  5.8    Hemoglobin 13.0 - 17.0 g/dL 9.7  8.5  7.5   Hematocrit 39.0 - 52.0 % 29.6  29.4  22.0   Platelets 150 - 400 K/uL 145  182  Latest Ref Rng & Units 06/02/2024   10:30 AM 05/07/2024   11:33 AM 05/07/2024   10:55 AM  CMP  Glucose 70 - 99 mg/dL 895  889  894   BUN 8 - 23 mg/dL 23  14  11    Creatinine 0.61 - 1.24 mg/dL 8.30  8.19  8.38   Sodium 135 - 145 mmol/L 138  143  139   Potassium 3.5 - 5.1 mmol/L 4.7  3.9  4.0   Chloride 98 - 111 mmol/L 105  107  108   CO2 22 - 32 mmol/L 23   22   Calcium  8.9 - 10.3 mg/dL 9.8   8.6    RADIOGRAPHIC STUDIES: No results found.  ASSESSMENT & PLAN ABDURAHMAN RUGG is a 63 y.o. male with medical history significant for castrate sensitive prostate cancer who presents for a follow up visit.  # Castrate Sensitive Prostate Cancer -- Patient unable to tolerate Zytiga  pills due to their size.  Discussed with pharmacy who noted that enzalutamide  would be the smallest oral therapy so made the switch.  --Labs from today were reviewed. WBC 3.4, Hgb 9.7, Plt 145, creatinine  improving to 1.56. PSA 0.02.  -- continue enzalutamide  160 mg p.o. daily. -- Continue with Eligard  every 3 month injections. Due today -- Recommend return to clinic in 3 months with ADT injection.   #Anemia: --Labs revealed iron deficiency anemia. Will arrange for IV iron to bolster levels.   # Weight Loss --refered to Cancer Center dietician  No orders of the defined types were placed in this encounter.   All questions were answered. The patient knows to call the clinic with any problems, questions or concerns.  I have spent a total of 30 minutes minutes of face-to-face and non-face-to-face time, preparing to see the patient,  performing a medically appropriate examination, counseling and educating the patient, documenting clinical information in the electronic health record, independently interpreting results and communicating results to the patient, and care coordination.   [name] PA-C Dept of Hematology and Oncology [facility] Phone: [phone]    07/06/2024 11:15 AM

## 2024-07-06 NOTE — Progress Notes (Signed)
 After appointment for injection, family alerted staff in lobby that patient was not himself. Charge RN brought patient to infusion center for evaluation. FSBG 138 @1152 . Patient noted to be minimally responsive and was profusely diaphoretic. Irene,PA came to chairside to evaluate patient. Order for 1L NS over 1 hour given. Patient back to baseline at discharge. Instructions to follow up if needed.

## 2024-07-07 ENCOUNTER — Encounter: Payer: Self-pay | Admitting: Hematology and Oncology

## 2024-07-07 ENCOUNTER — Telehealth: Payer: Self-pay

## 2024-07-07 LAB — TESTOSTERONE: Testosterone: 59 ng/dL — ABNORMAL LOW (ref 264–916)

## 2024-07-07 NOTE — Telephone Encounter (Signed)
 Pt's sig other, [name], advised after ins approval someone from [facility] scheduling will call to set up an appt for IV iron.

## 2024-07-07 NOTE — Telephone Encounter (Signed)
-----   Message from Johnston [place] Police sent at 07/07/2024 10:01 AM EST ----- Please notify patient's caretaker that labs revealed iron deficiency so we will arrange for IV iron to bolster levels.

## 2024-07-08 ENCOUNTER — Telehealth (INDEPENDENT_AMBULATORY_CARE_PROVIDER_SITE_OTHER): Admitting: Student

## 2024-07-08 DIAGNOSIS — Z7982 Long term (current) use of aspirin: Secondary | ICD-10-CM

## 2024-07-08 DIAGNOSIS — R64 Cachexia: Secondary | ICD-10-CM | POA: Diagnosis not present

## 2024-07-08 DIAGNOSIS — R2681 Unsteadiness on feet: Secondary | ICD-10-CM

## 2024-07-08 DIAGNOSIS — Z79899 Other long term (current) drug therapy: Secondary | ICD-10-CM | POA: Diagnosis not present

## 2024-07-08 DIAGNOSIS — Z8249 Family history of ischemic heart disease and other diseases of the circulatory system: Secondary | ICD-10-CM

## 2024-07-08 DIAGNOSIS — I10 Essential (primary) hypertension: Secondary | ICD-10-CM | POA: Diagnosis not present

## 2024-07-08 DIAGNOSIS — Z0279 Encounter for issue of other medical certificate: Secondary | ICD-10-CM

## 2024-07-08 DIAGNOSIS — G931 Anoxic brain damage, not elsewhere classified: Secondary | ICD-10-CM

## 2024-07-08 DIAGNOSIS — Z87891 Personal history of nicotine dependence: Secondary | ICD-10-CM | POA: Diagnosis not present

## 2024-07-08 DIAGNOSIS — E43 Unspecified severe protein-calorie malnutrition: Secondary | ICD-10-CM | POA: Diagnosis not present

## 2024-07-08 MED ORDER — CLOPIDOGREL BISULFATE 75 MG PO TABS: 75.0000 mg | ORAL_TABLET | Freq: Every day | ORAL | 3 refills | Status: AC

## 2024-07-08 MED ORDER — LOSARTAN POTASSIUM 25 MG PO TABS: 25.0000 mg | ORAL_TABLET | Freq: Every day | ORAL | 3 refills | Status: AC

## 2024-07-08 MED ORDER — METOPROLOL SUCCINATE ER 25 MG PO TB24: 12.5000 mg | ORAL_TABLET | Freq: Two times a day (BID) | ORAL | 2 refills | Status: AC

## 2024-07-08 MED ORDER — ENSURE PLUS HIGH PROTEIN PO LIQD: 1.0000 | Freq: Two times a day (BID) | ORAL | 17 refills | Status: AC

## 2024-07-08 MED ORDER — ATORVASTATIN CALCIUM 80 MG PO TABS: 80.0000 mg | ORAL_TABLET | Freq: Every day | ORAL | 3 refills | Status: AC

## 2024-07-08 NOTE — Progress Notes (Signed)
   Virtual Visit via Video Note  I connected with Antonio Navarro on 07/12/24 at  2:15 PM EST by a video enabled telemedicine application and verified that I am speaking with the correct person using two identifiers.  Patient Location: Home Provider Location: Office/Clinic  I discussed the limitations, risks, security, and privacy concerns of performing an evaluation and management service by video and the availability of in person appointments. I also discussed with the patient that there may be a patient responsible charge related to this service. The patient expressed understanding and agreed to proceed.  Subjective: PCP: [name], DO   ROS: Per HPI  Current Outpatient Medications:    acetaminophen  (TYLENOL ) 500 MG tablet, Take 500 mg by mouth every 6 (six) hours as needed for mild pain (pain score 1-3) or moderate pain (pain score 4-6)., Disp: , Rfl:    aspirin  81 MG chewable tablet, Chew 1 tablet (81 mg total) by mouth daily. (Patient taking differently: Chew 81 mg by mouth every morning.), Disp: , Rfl:    atorvastatin  (LIPITOR ) 80 MG tablet, Take 1 tablet (80 mg total) by mouth daily., Disp: 90 tablet, Rfl: 3   clopidogrel  (PLAVIX ) 75 MG tablet, Take 1 tablet (75 mg total) by mouth daily., Disp: 90 tablet, Rfl: 3   enzalutamide  (XTANDI ) 40 MG tablet, Take 4 tablets (160 mg total) by mouth daily., Disp: 120 tablet, Rfl: 2   feeding supplement (ENSURE PLUS HIGH PROTEIN) LIQD, Take 237 mLs by mouth 3 (three) times daily - between meals and at bedtime., Disp: 14220 mL, Rfl: 17   losartan  (COZAAR ) 25 MG tablet, Take 1 tablet (25 mg total) by mouth daily., Disp: 90 tablet, Rfl: 3   metoprolol  succinate (TOPROL -XL) 25 MG 24 hr tablet, Take 0.5 tablets (12.5 mg total) by mouth 2 (two) times daily. Can take an additional half tablet if heart rate is over 100, Disp: 100 tablet, Rfl: 2   ondansetron  (ZOFRAN -ODT) 4 MG disintegrating tablet, Take 4 mg by mouth every 8 (eight) hours as needed  for nausea or vomiting., Disp: , Rfl:    polyethylene glycol (MIRALAX  / GLYCOLAX ) 17 g packet, Take 17 g by mouth daily as needed for moderate constipation., Disp: 14 each, Rfl: 0  Observations/Objective: There were no vitals filed for this visit. Physical Exam  Assessment and Plan: Assessment & Plan Anoxic brain damage [facility]) DME referral sent for new hospital bed as his current one is broken Orders:   For home use only DME Hospital bed   Ambulatory Referral for DME  Gait instability  Orders:   For home use only DME Hospital bed   Ambulatory Referral for DME  Cachexia  Orders:   For home use only DME Hospital bed   Ambulatory Referral for DME  Essential hypertension Refilled Cozaar  25 mg daily and Toprol -XL 12.5 twice daily. Follows with Cardiology.     Protein-calorie malnutrition, severe Ensure shakes ordered        Follow Up Instructions: No follow-ups on file.     The patient was advised to call back or seek an in-person evaluation if the symptoms worsen or if the condition fails to improve as anticipated.    Patient discussed with Dr. Rosan Norman Lobstein, DO

## 2024-07-08 NOTE — Assessment & Plan Note (Addendum)
  Orders:   For home use only DME Hospital bed   Ambulatory Referral for DME

## 2024-07-08 NOTE — Assessment & Plan Note (Addendum)
 DME referral sent for new hospital bed as his current one is broken Orders:   For home use only DME Hospital bed   Ambulatory Referral for DME

## 2024-07-12 ENCOUNTER — Telehealth: Payer: Self-pay | Admitting: *Deleted

## 2024-07-12 NOTE — Assessment & Plan Note (Addendum)
 Refilled Cozaar  25 mg daily and Toprol -XL 12.5 twice daily. Follows with Cardiology.

## 2024-07-12 NOTE — Assessment & Plan Note (Signed)
 Refilled Lipitor  80, Plavix  75.  Denies recent chest pain.

## 2024-07-12 NOTE — Telephone Encounter (Unsigned)
 Copied from CRM [id number]. Topic: Clinical - Order For Equipment >> Jun 29, 2024  1:35 PM Cherylann RAMAN wrote: Reason for CRM: Andrienne, sister, states that patient's hospital bed has broken and she called the company and they stated that the patient now qualifies for a new bed with Medicaid. However, they are need the provider to send in a prescription/order for the hospital bed and face to face notes and narrative Faxed in to [employer] Fax:[phone]. Please contact [name] Clause, sister, at [phone] for additional information. >> Jul 12, 2024 10:11 AM Vivian Z wrote: [name] Clause (sister) is calling regarding order for bed due to his current bed being broken. She stated that [employer] is waiting on notes to be sent over in order to fulfill the order. She is requesting a callback when notes are sent.  >> Jul 02, 2024  9:16 AM Graeme ORN wrote: Patient sister called back. States she has not heard back. Would like update. Advised request sent to provider. Caller states patient bed is broken and she needs to have this completed. Can someone call her back. Thank You

## 2024-07-12 NOTE — Assessment & Plan Note (Addendum)
 Ensure shakes ordered

## 2024-07-13 NOTE — Telephone Encounter (Addendum)
 I called pt's sister who stated pt is lying in a broken bed. Stated she talked to [employer] who stated they need the office noted faxed to them. [facility] is not here; we have confirmation [facility] had faxed notes, order. I will ask H[facility] to re-fax OV 11/20 notes, demographic, and DME order. Done - faxed to [employer] Fax:[phone]. Pt's sister called and informed.

## 2024-07-13 NOTE — Telephone Encounter (Signed)
 Copied from CRM [id number]. Topic: Clinical - Order For Equipment >> Jun 29, 2024  1:35 PM Cherylann RAMAN wrote: Reason for CRM: Andrienne, sister, states that patient's hospital bed has broken and she called the company and they stated that the patient now qualifies for a new bed with Medicaid. However, they are need the provider to send in a prescription/order for the hospital bed and face to face notes and narrative Faxed in to [employer] Fax:[phone]. Please contact [name] Clause, sister, at [phone] for additional information. >> Jul 13, 2024 12:26 PM Susanna ORN wrote: Patient's sister, [name] Clause, calling to get an update on trying to get bed order escalated. She hasn't heard anything from anyone and wants an update. States patient's current bed is broken and they are needing a new one asap. Please give her a call back. CB #: [phone]. >> Jul 12, 2024 10:11 AM Vivian Z wrote: [name] Clause (sister) is calling regarding order for bed due to his current bed being broken. She stated that [employer] is waiting on notes to be sent over in order to fulfill the order. She is requesting a callback when notes are sent.  >> Jul 02, 2024  9:16 AM Graeme ORN wrote: Patient sister called back. States she has not heard back. Would like update. Advised request sent to provider. Caller states patient bed is broken and she needs to have this completed. Can someone call her back. Thank You

## 2024-07-14 NOTE — Telephone Encounter (Signed)
 Form and medical records has been faxed to [employer], confirmation went through.

## 2024-07-14 NOTE — Telephone Encounter (Unsigned)
 Copied from CRM [id number]. Topic: Clinical - Order For Equipment >> Jun 29, 2024  1:35 PM Cherylann RAMAN wrote: Reason for CRM: Andrienne, sister, states that patient's hospital bed has broken and she called the company and they stated that the patient now qualifies for a new bed with Medicaid. However, they are need the provider to send in a prescription/order for the hospital bed and face to face notes and narrative Faxed in to [employer] [phone]. Please contact [name] Clause, sister, at [phone] for additional information. >> Jul 14, 2024  8:45 AM Charsetta H wrote: CRM has been completed.  Please refer to telephone note from Gaetana Pan, RN dated 07/13/24 at 1:46 pm. >> Jul 13, 2024 12:26 PM Susanna ORN wrote: Patient's sister, [name] Clause, calling to get an update on trying to get bed order escalated. She hasn't heard anything from anyone and wants an update. States patient's current bed is broken and they are needing a new one asap. Please give her a call back. CB #: [phone]. >> Jul 12, 2024 10:11 AM Vivian Z wrote: [name] Clause (sister) is calling regarding order for bed due to his current bed being broken. She stated that [employer] is waiting on notes to be sent over in order to fulfill the order. She is requesting a callback when notes are sent.  >> Jul 02, 2024  9:16 AM Graeme ORN wrote: Patient sister called back. States she has not heard back. Would like update. Advised request sent to provider. Caller states patient bed is broken and she needs to have this completed. Can someone call her back. Thank You

## 2024-07-19 ENCOUNTER — Other Ambulatory Visit: Payer: Self-pay

## 2024-07-21 NOTE — Progress Notes (Signed)
 Internal Medicine Clinic Attending  Case discussed with the resident at the time of the visit.  We reviewed the resident's history and exam and pertinent patient test results.  I agree with the assessment, diagnosis, and plan of care documented in the resident's note.

## 2024-07-23 NOTE — Progress Notes (Deleted)
 Cardiology Office Note   Date:  07/23/2024  ID:  [name], DOB 04/05/1961, MRN [id number] PCP: [name], DO  [facility] Providers Cardiologist:  Wilbert Bihari, MD    History of Present Illness Antonio Navarro is a 63 y.o. male with a past medical history of CAD status post inferior lateral STEMI and postcardiac arrest/VF 07/2020 with PCI/DES to 5% mid circumflex, HTN, HLD, ischemic DCM, prostate CA who is here for follow-up appointment.  Patient was initially seen 12/21 when he was admitted for cardiac arrest in the setting of inferior lateral STEMI.  He was resuscitated in the field with CPR and intubation underwent emergent LHC which revealed occluded left circumflex treated with PCI/DES x 1 and no other obstructive disease present.  2D echo was completed showing EF of 40 to 45% and experienced AKI during admission with improvement after fluid resuscitation.  He suffered anoxic brain injury with prolonged hospitalization.  He was started on GDMT with repeat echo completed 12/22 showing recovered EF of 55 to 60% with no RWMA and mild LVH.  Seen in the office 10/2021 for preoperative clearance for prostate biopsy.  Currently at that time was bed ridden and wheelchair-bound due to previous anoxic brain injury.  Currently being treated by Dr. Federico for management of prostate cancer and was receiving Eligard  as well as Lupron  shots from urology.  He was admitted on 04/02/2024 with UTI/AKI creatinine of 2.1 and hematuria.  Found to have urolithiasis and was treated with antibiotics and underwent cystoscopy with bilateral JJ stent placement.  Had ASA and Plavix  held during hospitalization due to anemia.   He is seen in the emergency room 9/19.  Prior to left ureteroscopic and laser lithotripsy.  Echo and nuclear stress testing have been planned.  Echo was completed and could not evaluate regional wall motion abnormalities.  There was mild concentric LVH and moderate pericardial effusion  without tamponade.  Sent to ER.  Having no symptoms and was seen in consultation by cardiology.  He was normotensive and heart rate was okay.  Repeat echo done at bedside demonstrated small effusion and no evidence of tamponade.  Discharge from the emergency room.  He had still not had his stress testing.  He was able to answer yes/no questions but shaking his head and was thought that he understood but is nonverbal.  His girlfriend was with him at the time of office visit and she lives with him and takes care of him.  He is bedbound and in a wheelchair but he is able to eat on his own.  They do not note him having any shortness of breath or chest discomfort.  No palpitations, syncope or presyncope.  Today, he***  ROS: pertinent ROS in HPI  Studies Reviewed     Echo 05/07/24  IMPRESSIONS     1. Left ventricular ejection fraction, by estimation, is 60 to 65%. The  left ventricle has normal function. There is mild left ventricular  hypertrophy.   2. Right ventricular systolic function is mildly reduced. The right  ventricular size is mildly enlarged.   3. Limited study No full doppler or color flow Moderate pericardial  effusion nearly circumferential. No tamponade with dilated RV no diastolic  collapse and normal diameter IVC. Suggest f/u echo in 4-6 weeks depending  on clinical status Dr Santo  has seen echo and patient . Moderate pericardial effusion.   4. The mitral valve is abnormal. Trivial mitral valve regurgitation.   5. The aortic  valve is tricuspid. There is mild calcification of the  aortic valve. There is mild thickening of the aortic valve. Aortic valve  sclerosis is present, with no evidence of aortic valve stenosis.   6. The inferior vena cava is normal in size with greater than 50%  respiratory variability, suggesting right atrial pressure of 3 mmHg.  Risk Assessment/Calculations {Does this patient have ATRIAL FIBRILLATION?:[phone]} No BP recorded.  {Refresh  Note OR Click here to enter BP  :1}***       Physical Exam VS:  There were no vitals taken for this visit.       Wt Readings from Last 3 Encounters:  07/06/24 118 lb 1.6 oz (53.6 kg)  06/02/24 145 lb (65.8 kg)  05/13/24 135 lb (61.2 kg)    GEN: Well nourished, well developed in no acute distress NECK: No JVD; No carotid bruits CARDIAC: ***RRR, no murmurs, rubs, gallops RESPIRATORY:  Clear to auscultation without rales, wheezing or rhonchi  ABDOMEN: Soft, non-tender, non-distended EXTREMITIES:  No edema; No deformity   ASSESSMENT AND PLAN HTN CAD Prostate CA History of ICM Pericardial effusion    {Are you ordering a CV Procedure (e.g. stress test, cath, DCCV, TEE, etc)?   Press F2        :[id number]}  Dispo: ***  Signed, Orren LOISE Fabry, PA-C

## 2024-07-27 ENCOUNTER — Encounter: Payer: Self-pay | Admitting: Cardiology

## 2024-07-27 ENCOUNTER — Ambulatory Visit: Admitting: Physician Assistant

## 2024-07-27 DIAGNOSIS — I2510 Atherosclerotic heart disease of native coronary artery without angina pectoris: Secondary | ICD-10-CM

## 2024-07-27 DIAGNOSIS — C61 Malignant neoplasm of prostate: Secondary | ICD-10-CM

## 2024-07-27 DIAGNOSIS — I10 Essential (primary) hypertension: Secondary | ICD-10-CM

## 2024-07-27 DIAGNOSIS — I255 Ischemic cardiomyopathy: Secondary | ICD-10-CM

## 2024-07-30 ENCOUNTER — Encounter: Payer: Self-pay | Admitting: Cardiology

## 2024-08-26 ENCOUNTER — Inpatient Hospital Stay: Admitting: Hematology and Oncology

## 2024-08-26 ENCOUNTER — Inpatient Hospital Stay

## 2024-08-26 ENCOUNTER — Other Ambulatory Visit: Payer: Self-pay

## 2024-08-31 ENCOUNTER — Other Ambulatory Visit: Payer: Self-pay

## 2024-08-31 NOTE — Progress Notes (Signed)
 Antonio Navarro, patient's sister reports that he is no longer on the Xtandi . He last filled on 02/17/24.  I notified the office and am disenrolling from Specialty Pharmacy services a this time.

## 2024-09-02 ENCOUNTER — Other Ambulatory Visit: Payer: Self-pay | Admitting: Physician Assistant

## 2024-09-02 ENCOUNTER — Telehealth: Payer: Self-pay | Admitting: *Deleted

## 2024-09-02 DIAGNOSIS — C61 Malignant neoplasm of prostate: Secondary | ICD-10-CM

## 2024-09-02 NOTE — Telephone Encounter (Signed)
 TCT patient's sister [name] regarding pt's Xtandi . Spoke with her. We discussed patient's need for Xtandi  for controlling his prostate cancer. She states he has not taken this in months and it was her understanding that it was discontinued. Advised that per his last clinic visit, [name], PA-C wrote in that note that he should still continue to take this.. [name] states he has not taken it in months.  Advised that Dr. Federico does want him to continue this but as it has been several months since he has taken, Dr. Federico would like [name] to come in for repeat baseline labs next week and then we can resume the Xtandi . [name] states it is very difficult to bring him him here as he is bedbound but she will contact his caregivers and call me back with a day and time that will work for labs next week. Advised that he will still need to come here in February for clinic visit and his Eligard  injection. She voiced understanding though she states she would prefer just one visit here in February. Dr. Federico is aware of her concerns. Will await call back to schedule lab appt.

## 2024-09-06 ENCOUNTER — Other Ambulatory Visit: Payer: Self-pay | Admitting: *Deleted

## 2024-09-06 ENCOUNTER — Other Ambulatory Visit: Payer: Self-pay

## 2024-09-06 ENCOUNTER — Other Ambulatory Visit (HOSPITAL_COMMUNITY): Payer: Self-pay

## 2024-09-06 ENCOUNTER — Telehealth: Payer: Self-pay | Admitting: *Deleted

## 2024-09-06 DIAGNOSIS — C61 Malignant neoplasm of prostate: Secondary | ICD-10-CM

## 2024-09-06 MED ORDER — ENZALUTAMIDE 40 MG PO TABS
160.0000 mg | ORAL_TABLET | Freq: Every day | ORAL | 0 refills | Status: AC
  Filled 2024-09-06 (×2): qty 120, 30d supply, fill #0

## 2024-09-06 NOTE — Progress Notes (Signed)
 Specialty Pharmacy Initiation Note   Antonio Navarro is a 64 y.o. male who will be followed by the specialty pharmacy service for RxSp Oncology    Review of administration, indication, effectiveness, safety, potential side effects, storage/disposable, and missed dose instructions occurred today for patient's specialty medication(s) Enzalutamide  (XTANDI )     Patient/Caregiver did not have any additional questions or concerns.   Patient's therapy is appropriate to: Continue Patient was paused on therapy and does not have any additional questions. Patient will restart the therapy.    Goals Addressed             This Visit's Progress    Maintain optimal adherence to therapy       Patient is unable to be assessed as therapy was recently initiated. Patient will work on increased adherence        Jamaine Quintin, PharmD Hematology/Oncology Clinical Pharmacist [facility] [phone]

## 2024-09-06 NOTE — Progress Notes (Signed)
 Specialty Pharmacy Initial Fill Coordination Note  Antonio Navarro is a 64 y.o. male contacted today regarding refills of specialty medication(s) Enzalutamide  (XTANDI ) .  Patient requested Delivery  on 09/08/24  to verified address [address]   [place]  [address]   Medication will be filled on 09/07/24.   Patient is aware of $0 copayment.    Antonio Navarro, CPhT [place]  [facility] Specialty Pharmacy Services Oncology Pharmacy Patient Advocate Specialist II Antonio Navarro Phone: [phone]  Fax: [phone] [email or web address]

## 2024-09-06 NOTE — Telephone Encounter (Signed)
 Tct patient's caregiver, [name] Lemmings to review pt's medications with her. Spoke with her. She states that Mr. Mazzie has been taking his Xtandi . She states that he may only take 1 or 2 at a time. And sometimes he spits them out. Advised that Dr. Federico has ok'd a refill of of his Xtandi  for 1 month and then will send in refills at pt's next visit in September 30, 2024 [name] voiced understanding.

## 2024-09-30 ENCOUNTER — Inpatient Hospital Stay

## 2024-09-30 ENCOUNTER — Inpatient Hospital Stay: Admitting: Hematology and Oncology

## 2024-12-02 ENCOUNTER — Inpatient Hospital Stay: Admitting: Hematology and Oncology

## 2024-12-02 ENCOUNTER — Inpatient Hospital Stay
